# Patient Record
Sex: Male | Born: 1950
Health system: Southern US, Community
[De-identification: ages and names within clinical notes are randomized; demographics above are authoritative.]

## PROBLEM LIST (undated history)

## (undated) ENCOUNTER — Emergency Department (HOSPITAL_COMMUNITY): Disposition: A | Discharge status: Home / Self Care | Payer: Medicare Other

## (undated) DIAGNOSIS — I219 Acute myocardial infarction, unspecified: Secondary | ICD-10-CM

## (undated) DIAGNOSIS — C6290 Malignant neoplasm of unspecified testis, unspecified whether descended or undescended: Secondary | ICD-10-CM

## (undated) DIAGNOSIS — N200 Calculus of kidney: Secondary | ICD-10-CM

## (undated) DIAGNOSIS — F17201 Nicotine dependence, unspecified, in remission: Secondary | ICD-10-CM

## (undated) DIAGNOSIS — E785 Hyperlipidemia, unspecified: Secondary | ICD-10-CM

## (undated) DIAGNOSIS — I2510 Atherosclerotic heart disease of native coronary artery without angina pectoris: Secondary | ICD-10-CM

## (undated) DIAGNOSIS — J449 Chronic obstructive pulmonary disease, unspecified: Secondary | ICD-10-CM

## (undated) DIAGNOSIS — I4821 Permanent atrial fibrillation: Secondary | ICD-10-CM

## (undated) DIAGNOSIS — I714 Abdominal aortic aneurysm, without rupture, unspecified: Secondary | ICD-10-CM

## (undated) DIAGNOSIS — D649 Anemia, unspecified: Secondary | ICD-10-CM

## (undated) DIAGNOSIS — I5042 Chronic combined systolic (congestive) and diastolic (congestive) heart failure: Secondary | ICD-10-CM

## (undated) DIAGNOSIS — I739 Peripheral vascular disease, unspecified: Secondary | ICD-10-CM

## (undated) DIAGNOSIS — K8000 Calculus of gallbladder with acute cholecystitis without obstruction: Secondary | ICD-10-CM

## (undated) DIAGNOSIS — I10 Essential (primary) hypertension: Secondary | ICD-10-CM

## (undated) DIAGNOSIS — K219 Gastro-esophageal reflux disease without esophagitis: Secondary | ICD-10-CM

## (undated) DIAGNOSIS — Z7901 Long term (current) use of anticoagulants: Secondary | ICD-10-CM

## (undated) DIAGNOSIS — N184 Chronic kidney disease, stage 4 (severe): Secondary | ICD-10-CM

## (undated) DIAGNOSIS — I679 Cerebrovascular disease, unspecified: Secondary | ICD-10-CM

## (undated) HISTORY — DX: Anemia, unspecified: D64.9

## (undated) HISTORY — DX: Chronic kidney disease, stage 4 (severe): N18.4

## (undated) HISTORY — DX: Calculus of kidney: N20.0

## (undated) HISTORY — DX: Peripheral vascular disease, unspecified: I73.9

## (undated) HISTORY — DX: Hyperlipidemia, unspecified: E78.5

## (undated) HISTORY — DX: Cerebrovascular disease, unspecified: I67.9

## (undated) HISTORY — DX: Malignant neoplasm of unspecified testis, unspecified whether descended or undescended: C62.90

## (undated) HISTORY — DX: Nicotine dependence, unspecified, in remission: F17.201

## (undated) HISTORY — DX: Atherosclerotic heart disease of native coronary artery without angina pectoris: I25.10

## (undated) HISTORY — DX: Essential (primary) hypertension: I10

## (undated) HISTORY — DX: Long term (current) use of anticoagulants: Z79.01

## (undated) HISTORY — DX: Calculus of gallbladder with acute cholecystitis without obstruction: K80.00

---

## 1988-05-03 DIAGNOSIS — C6290 Malignant neoplasm of unspecified testis, unspecified whether descended or undescended: Secondary | ICD-10-CM

## 1988-05-03 HISTORY — DX: Malignant neoplasm of unspecified testis, unspecified whether descended or undescended: C62.90

## 1988-05-03 HISTORY — PX: OTHER SURGICAL HISTORY: SHX169

## 2000-05-03 DIAGNOSIS — I679 Cerebrovascular disease, unspecified: Secondary | ICD-10-CM

## 2000-05-03 HISTORY — DX: Cerebrovascular disease, unspecified: I67.9

## 2001-01-09 ENCOUNTER — Encounter: Payer: Self-pay | Admitting: Emergency Medicine

## 2001-01-09 ENCOUNTER — Emergency Department (HOSPITAL_COMMUNITY): Admission: EM | Admit: 2001-01-09 | Discharge: 2001-01-09 | Payer: Self-pay | Admitting: Emergency Medicine

## 2002-05-03 HISTORY — PX: APPENDECTOMY: SHX54

## 2002-07-13 ENCOUNTER — Encounter: Payer: Self-pay | Admitting: *Deleted

## 2002-07-14 ENCOUNTER — Encounter: Payer: Self-pay | Admitting: *Deleted

## 2002-07-14 ENCOUNTER — Inpatient Hospital Stay (HOSPITAL_COMMUNITY): Admission: EM | Admit: 2002-07-14 | Discharge: 2002-07-28 | Payer: Self-pay | Admitting: *Deleted

## 2002-07-18 ENCOUNTER — Encounter: Payer: Self-pay | Admitting: General Surgery

## 2002-07-18 ENCOUNTER — Encounter: Payer: Self-pay | Admitting: *Deleted

## 2002-07-20 ENCOUNTER — Encounter: Payer: Self-pay | Admitting: General Surgery

## 2003-05-09 ENCOUNTER — Emergency Department (HOSPITAL_COMMUNITY): Admission: EM | Admit: 2003-05-09 | Discharge: 2003-05-10 | Payer: Self-pay | Admitting: Emergency Medicine

## 2003-10-09 ENCOUNTER — Emergency Department (HOSPITAL_COMMUNITY): Admission: EM | Admit: 2003-10-09 | Discharge: 2003-10-09 | Payer: Self-pay | Admitting: Emergency Medicine

## 2003-12-24 ENCOUNTER — Emergency Department (HOSPITAL_COMMUNITY): Admission: EM | Admit: 2003-12-24 | Discharge: 2003-12-24 | Payer: Self-pay | Admitting: *Deleted

## 2005-03-02 ENCOUNTER — Ambulatory Visit: Payer: Self-pay | Admitting: Family Medicine

## 2005-12-14 ENCOUNTER — Emergency Department (HOSPITAL_COMMUNITY): Admission: EM | Admit: 2005-12-14 | Discharge: 2005-12-14 | Payer: Self-pay | Admitting: Emergency Medicine

## 2006-03-11 ENCOUNTER — Emergency Department (HOSPITAL_COMMUNITY): Admission: EM | Admit: 2006-03-11 | Discharge: 2006-03-12 | Payer: Self-pay | Admitting: Emergency Medicine

## 2006-12-16 ENCOUNTER — Inpatient Hospital Stay (HOSPITAL_COMMUNITY): Admission: EM | Admit: 2006-12-16 | Discharge: 2006-12-20 | Payer: Self-pay | Admitting: Emergency Medicine

## 2006-12-16 ENCOUNTER — Ambulatory Visit: Payer: Self-pay | Admitting: Internal Medicine

## 2006-12-27 ENCOUNTER — Ambulatory Visit: Payer: Self-pay | Admitting: Cardiovascular Disease

## 2006-12-28 ENCOUNTER — Ambulatory Visit (HOSPITAL_COMMUNITY): Admission: RE | Admit: 2006-12-28 | Discharge: 2006-12-28 | Payer: Self-pay | Admitting: Cardiovascular Disease

## 2006-12-28 ENCOUNTER — Ambulatory Visit: Payer: Self-pay | Admitting: Cardiovascular Disease

## 2007-01-11 ENCOUNTER — Ambulatory Visit: Payer: Self-pay | Admitting: Cardiology

## 2007-01-12 ENCOUNTER — Ambulatory Visit (HOSPITAL_COMMUNITY): Admission: RE | Admit: 2007-01-12 | Discharge: 2007-01-12 | Payer: Self-pay | Admitting: Cardiovascular Disease

## 2007-01-19 ENCOUNTER — Ambulatory Visit: Payer: Self-pay | Admitting: Cardiology

## 2007-02-07 ENCOUNTER — Ambulatory Visit: Payer: Self-pay | Admitting: Cardiovascular Disease

## 2007-02-24 ENCOUNTER — Ambulatory Visit: Payer: Self-pay | Admitting: Cardiovascular Disease

## 2007-04-20 ENCOUNTER — Ambulatory Visit: Payer: Self-pay | Admitting: Cardiovascular Disease

## 2007-04-21 ENCOUNTER — Ambulatory Visit: Payer: Self-pay | Admitting: Cardiology

## 2007-05-24 ENCOUNTER — Ambulatory Visit (HOSPITAL_COMMUNITY): Admission: RE | Admit: 2007-05-24 | Discharge: 2007-05-24 | Payer: Self-pay | Admitting: Cardiovascular Disease

## 2007-05-24 ENCOUNTER — Ambulatory Visit: Payer: Self-pay | Admitting: Cardiovascular Disease

## 2007-05-31 ENCOUNTER — Ambulatory Visit: Payer: Self-pay | Admitting: Vascular Surgery

## 2007-06-06 ENCOUNTER — Ambulatory Visit (HOSPITAL_COMMUNITY): Admission: RE | Admit: 2007-06-06 | Discharge: 2007-06-06 | Payer: Self-pay | Admitting: Vascular Surgery

## 2007-06-07 ENCOUNTER — Ambulatory Visit: Payer: Self-pay | Admitting: Vascular Surgery

## 2007-08-10 ENCOUNTER — Ambulatory Visit: Payer: Self-pay | Admitting: Cardiology

## 2007-12-06 ENCOUNTER — Ambulatory Visit: Payer: Self-pay | Admitting: Cardiology

## 2007-12-06 ENCOUNTER — Ambulatory Visit: Payer: Self-pay | Admitting: Internal Medicine

## 2007-12-15 ENCOUNTER — Ambulatory Visit: Payer: Self-pay | Admitting: Cardiology

## 2007-12-15 ENCOUNTER — Encounter (INDEPENDENT_AMBULATORY_CARE_PROVIDER_SITE_OTHER): Payer: Self-pay | Admitting: *Deleted

## 2007-12-15 LAB — CONVERTED CEMR LAB
BUN: 12 mg/dL
CO2: 28 meq/L
Calcium: 9.6 mg/dL
Chloride: 101 meq/L
Creatinine, Ser: 1.06 mg/dL
Glucose, Bld: 117 mg/dL
HCT: 47.9 %
Hemoglobin: 16.3 g/dL
MCV: 88.5 fL
Platelets: 166 10*3/uL
Potassium: 4.2 meq/L
Sodium: 137 meq/L
WBC: 5.8 10*3/uL

## 2007-12-19 ENCOUNTER — Ambulatory Visit (HOSPITAL_COMMUNITY): Admission: RE | Admit: 2007-12-19 | Discharge: 2007-12-19 | Payer: Self-pay | Admitting: Cardiology

## 2007-12-21 ENCOUNTER — Ambulatory Visit: Payer: Self-pay | Admitting: Cardiology

## 2007-12-21 ENCOUNTER — Encounter: Payer: Self-pay | Admitting: Cardiology

## 2007-12-21 ENCOUNTER — Ambulatory Visit (HOSPITAL_COMMUNITY): Admission: RE | Admit: 2007-12-21 | Discharge: 2007-12-21 | Payer: Self-pay | Admitting: Cardiology

## 2007-12-22 ENCOUNTER — Ambulatory Visit: Payer: Self-pay | Admitting: Cardiology

## 2007-12-28 ENCOUNTER — Ambulatory Visit: Payer: Self-pay | Admitting: Vascular Surgery

## 2008-01-09 ENCOUNTER — Ambulatory Visit: Payer: Self-pay | Admitting: Cardiology

## 2008-04-09 ENCOUNTER — Ambulatory Visit: Payer: Self-pay | Admitting: Cardiology

## 2008-08-08 ENCOUNTER — Ambulatory Visit: Payer: Self-pay | Admitting: Cardiology

## 2008-10-22 DIAGNOSIS — C6290 Malignant neoplasm of unspecified testis, unspecified whether descended or undescended: Secondary | ICD-10-CM

## 2008-10-22 DIAGNOSIS — N200 Calculus of kidney: Secondary | ICD-10-CM | POA: Insufficient documentation

## 2008-10-22 DIAGNOSIS — I10 Essential (primary) hypertension: Secondary | ICD-10-CM | POA: Insufficient documentation

## 2008-10-22 DIAGNOSIS — Z8547 Personal history of malignant neoplasm of testis: Secondary | ICD-10-CM | POA: Insufficient documentation

## 2008-10-23 ENCOUNTER — Encounter (INDEPENDENT_AMBULATORY_CARE_PROVIDER_SITE_OTHER): Payer: Self-pay

## 2008-10-24 ENCOUNTER — Encounter: Payer: Self-pay | Admitting: Cardiology

## 2008-10-24 ENCOUNTER — Ambulatory Visit: Payer: Self-pay | Admitting: Cardiology

## 2008-10-24 DIAGNOSIS — E785 Hyperlipidemia, unspecified: Secondary | ICD-10-CM | POA: Insufficient documentation

## 2008-10-31 ENCOUNTER — Ambulatory Visit: Payer: Self-pay | Admitting: Cardiology

## 2008-11-07 ENCOUNTER — Ambulatory Visit: Payer: Self-pay | Admitting: Cardiology

## 2008-11-18 ENCOUNTER — Ambulatory Visit: Payer: Self-pay | Admitting: Cardiology

## 2008-11-19 ENCOUNTER — Encounter: Payer: Self-pay | Admitting: Cardiology

## 2008-12-05 ENCOUNTER — Ambulatory Visit: Payer: Self-pay | Admitting: Cardiology

## 2008-12-16 ENCOUNTER — Encounter: Payer: Self-pay | Admitting: *Deleted

## 2008-12-19 ENCOUNTER — Ambulatory Visit: Payer: Self-pay | Admitting: Cardiology

## 2008-12-19 ENCOUNTER — Encounter: Payer: Self-pay | Admitting: Cardiology

## 2008-12-19 LAB — CONVERTED CEMR LAB: POC INR: 2

## 2008-12-23 ENCOUNTER — Encounter (INDEPENDENT_AMBULATORY_CARE_PROVIDER_SITE_OTHER): Payer: Self-pay | Admitting: *Deleted

## 2008-12-23 LAB — CONVERTED CEMR LAB
OCCULT 1: NEGATIVE
OCCULT 2: NEGATIVE
OCCULT 3: NEGATIVE

## 2008-12-25 ENCOUNTER — Ambulatory Visit: Payer: Self-pay | Admitting: Cardiovascular Disease

## 2008-12-25 ENCOUNTER — Encounter: Payer: Self-pay | Admitting: Physician Assistant

## 2008-12-25 ENCOUNTER — Ambulatory Visit: Payer: Self-pay | Admitting: Cardiology

## 2009-01-08 ENCOUNTER — Encounter: Payer: Self-pay | Admitting: Cardiology

## 2009-01-09 ENCOUNTER — Ambulatory Visit: Payer: Self-pay

## 2009-01-09 LAB — CONVERTED CEMR LAB: POC INR: 2.4

## 2009-02-03 ENCOUNTER — Ambulatory Visit: Payer: Self-pay | Admitting: Cardiology

## 2009-02-03 LAB — CONVERTED CEMR LAB: POC INR: 2

## 2009-03-03 ENCOUNTER — Telehealth (INDEPENDENT_AMBULATORY_CARE_PROVIDER_SITE_OTHER): Payer: Self-pay | Admitting: *Deleted

## 2009-03-03 ENCOUNTER — Ambulatory Visit: Payer: Self-pay | Admitting: Cardiology

## 2009-03-03 LAB — CONVERTED CEMR LAB: POC INR: 2.2

## 2009-04-03 ENCOUNTER — Ambulatory Visit: Payer: Self-pay | Admitting: Cardiology

## 2009-04-03 LAB — CONVERTED CEMR LAB: POC INR: 3.1

## 2009-04-04 ENCOUNTER — Encounter (INDEPENDENT_AMBULATORY_CARE_PROVIDER_SITE_OTHER): Payer: Self-pay | Admitting: *Deleted

## 2009-04-16 ENCOUNTER — Ambulatory Visit: Payer: Self-pay | Admitting: Cardiology

## 2009-05-01 ENCOUNTER — Ambulatory Visit: Payer: Self-pay | Admitting: Cardiology

## 2009-05-01 LAB — CONVERTED CEMR LAB: POC INR: 1.8

## 2009-05-22 ENCOUNTER — Ambulatory Visit: Payer: Self-pay | Admitting: Cardiology

## 2009-05-22 LAB — CONVERTED CEMR LAB: POC INR: 3.5

## 2009-06-16 ENCOUNTER — Emergency Department (HOSPITAL_COMMUNITY): Admission: EM | Admit: 2009-06-16 | Discharge: 2009-06-16 | Payer: Self-pay | Admitting: Emergency Medicine

## 2009-06-16 ENCOUNTER — Ambulatory Visit: Payer: Self-pay | Admitting: Cardiology

## 2009-06-16 LAB — CONVERTED CEMR LAB: POC INR: 3.5

## 2009-07-02 ENCOUNTER — Ambulatory Visit: Payer: Self-pay | Admitting: Cardiology

## 2009-07-02 LAB — CONVERTED CEMR LAB: POC INR: 3.3

## 2009-07-28 ENCOUNTER — Ambulatory Visit: Payer: Self-pay | Admitting: Cardiology

## 2009-07-28 LAB — CONVERTED CEMR LAB: POC INR: 2

## 2009-08-14 ENCOUNTER — Ambulatory Visit: Payer: Self-pay | Admitting: Cardiology

## 2009-08-25 ENCOUNTER — Ambulatory Visit: Payer: Self-pay | Admitting: Cardiology

## 2009-08-25 LAB — CONVERTED CEMR LAB: POC INR: 1.9

## 2009-09-17 ENCOUNTER — Ambulatory Visit: Payer: Self-pay | Admitting: Cardiology

## 2009-10-08 ENCOUNTER — Ambulatory Visit: Payer: Self-pay | Admitting: Cardiology

## 2009-10-08 LAB — CONVERTED CEMR LAB: POC INR: 3.1

## 2009-11-05 ENCOUNTER — Ambulatory Visit: Payer: Self-pay | Admitting: Cardiovascular Disease

## 2009-11-05 LAB — CONVERTED CEMR LAB: POC INR: 2.4

## 2009-11-07 ENCOUNTER — Encounter (INDEPENDENT_AMBULATORY_CARE_PROVIDER_SITE_OTHER): Payer: Self-pay | Admitting: *Deleted

## 2009-11-07 ENCOUNTER — Encounter: Payer: Self-pay | Admitting: Cardiology

## 2009-11-07 ENCOUNTER — Encounter (INDEPENDENT_AMBULATORY_CARE_PROVIDER_SITE_OTHER): Payer: Self-pay

## 2009-11-10 ENCOUNTER — Encounter: Payer: Self-pay | Admitting: Cardiology

## 2009-11-10 LAB — CONVERTED CEMR LAB
ALT: 11 units/L (ref 0–53)
AST: 15 units/L (ref 0–37)
Albumin: 4.1 g/dL (ref 3.5–5.2)
Alkaline Phosphatase: 67 units/L (ref 39–117)
Bilirubin, Direct: 0.1 mg/dL (ref 0.0–0.3)
Cholesterol: 105 mg/dL (ref 0–200)
HDL: 31 mg/dL — ABNORMAL LOW (ref >39)
Indirect Bilirubin: 0.6 mg/dL (ref 0.0–0.9)
LDL Cholesterol: 29 mg/dL (ref 0–99)
Total Bilirubin: 0.7 mg/dL (ref 0.3–1.2)
Total CHOL/HDL Ratio: 3.4
Total Protein: 6.8 g/dL (ref 6.0–8.3)
Triglycerides: 224 mg/dL — ABNORMAL HIGH (ref <150)
VLDL: 45 mg/dL — ABNORMAL HIGH (ref 0–40)

## 2009-11-13 ENCOUNTER — Ambulatory Visit: Payer: Self-pay | Admitting: Cardiology

## 2009-11-13 DIAGNOSIS — I4821 Permanent atrial fibrillation: Secondary | ICD-10-CM | POA: Insufficient documentation

## 2009-11-13 DIAGNOSIS — I739 Peripheral vascular disease, unspecified: Secondary | ICD-10-CM | POA: Insufficient documentation

## 2009-11-13 DIAGNOSIS — I2510 Atherosclerotic heart disease of native coronary artery without angina pectoris: Secondary | ICD-10-CM | POA: Insufficient documentation

## 2009-11-14 ENCOUNTER — Encounter: Payer: Self-pay | Admitting: Cardiology

## 2009-11-17 ENCOUNTER — Ambulatory Visit: Payer: Self-pay | Admitting: Cardiology

## 2009-11-17 ENCOUNTER — Encounter (INDEPENDENT_AMBULATORY_CARE_PROVIDER_SITE_OTHER): Payer: Self-pay | Admitting: *Deleted

## 2009-11-17 LAB — CONVERTED CEMR LAB
OCCULT 1: NEGATIVE
OCCULT 2: NEGATIVE
OCCULT 3: NEGATIVE

## 2009-11-26 ENCOUNTER — Encounter (INDEPENDENT_AMBULATORY_CARE_PROVIDER_SITE_OTHER): Payer: Self-pay | Admitting: *Deleted

## 2009-12-01 ENCOUNTER — Encounter (INDEPENDENT_AMBULATORY_CARE_PROVIDER_SITE_OTHER): Payer: Self-pay

## 2009-12-01 ENCOUNTER — Encounter (INDEPENDENT_AMBULATORY_CARE_PROVIDER_SITE_OTHER): Payer: Self-pay | Admitting: *Deleted

## 2009-12-04 ENCOUNTER — Ambulatory Visit: Payer: Self-pay | Admitting: Cardiology

## 2009-12-04 LAB — CONVERTED CEMR LAB: POC INR: 2

## 2010-01-01 ENCOUNTER — Ambulatory Visit: Payer: Self-pay | Admitting: Cardiology

## 2010-01-01 LAB — CONVERTED CEMR LAB: POC INR: 2.9

## 2010-01-02 ENCOUNTER — Ambulatory Visit: Payer: Self-pay | Admitting: Cardiology

## 2010-01-29 ENCOUNTER — Encounter (INDEPENDENT_AMBULATORY_CARE_PROVIDER_SITE_OTHER): Payer: Self-pay | Admitting: *Deleted

## 2010-02-02 ENCOUNTER — Ambulatory Visit: Payer: Self-pay | Admitting: Cardiology

## 2010-02-02 ENCOUNTER — Telehealth (INDEPENDENT_AMBULATORY_CARE_PROVIDER_SITE_OTHER): Payer: Self-pay

## 2010-02-02 LAB — CONVERTED CEMR LAB: POC INR: 2.8

## 2010-03-02 ENCOUNTER — Ambulatory Visit: Payer: Self-pay | Admitting: Cardiology

## 2010-03-02 LAB — CONVERTED CEMR LAB: POC INR: 2.6

## 2010-03-30 ENCOUNTER — Ambulatory Visit: Payer: Self-pay | Admitting: Cardiology

## 2010-03-30 LAB — CONVERTED CEMR LAB: POC INR: 2.7

## 2010-04-07 ENCOUNTER — Ambulatory Visit: Payer: Self-pay | Admitting: Cardiology

## 2010-04-29 ENCOUNTER — Ambulatory Visit: Payer: Self-pay | Admitting: Cardiology

## 2010-05-07 LAB — CONVERTED CEMR LAB: POC INR: 2.1

## 2010-05-24 ENCOUNTER — Encounter: Payer: Self-pay | Admitting: Cardiovascular Disease

## 2010-05-24 ENCOUNTER — Encounter: Payer: Self-pay | Admitting: Vascular Surgery

## 2010-05-28 ENCOUNTER — Inpatient Hospital Stay (HOSPITAL_COMMUNITY)
Admission: AD | Admit: 2010-05-28 | Discharge: 2010-06-05 | DRG: 287 | Disposition: A | Discharge status: Home / Self Care | Payer: MEDICARE | Source: Other Acute Inpatient Hospital | Attending: Cardiology | Admitting: Cardiology

## 2010-05-28 ENCOUNTER — Ambulatory Visit
Admission: RE | Admit: 2010-05-28 | Discharge: 2010-05-28 | Payer: Self-pay | Source: Home / Self Care | Attending: Cardiology | Admitting: Cardiology

## 2010-05-28 DIAGNOSIS — I2510 Atherosclerotic heart disease of native coronary artery without angina pectoris: Principal | ICD-10-CM | POA: Diagnosis present

## 2010-05-28 DIAGNOSIS — Z7982 Long term (current) use of aspirin: Secondary | ICD-10-CM

## 2010-05-28 DIAGNOSIS — I252 Old myocardial infarction: Secondary | ICD-10-CM

## 2010-05-28 DIAGNOSIS — Z87891 Personal history of nicotine dependence: Secondary | ICD-10-CM

## 2010-05-28 DIAGNOSIS — Z9861 Coronary angioplasty status: Secondary | ICD-10-CM

## 2010-05-28 DIAGNOSIS — I4891 Unspecified atrial fibrillation: Secondary | ICD-10-CM | POA: Diagnosis present

## 2010-05-28 DIAGNOSIS — I10 Essential (primary) hypertension: Secondary | ICD-10-CM | POA: Diagnosis present

## 2010-05-28 DIAGNOSIS — Z8547 Personal history of malignant neoplasm of testis: Secondary | ICD-10-CM

## 2010-05-28 DIAGNOSIS — I2589 Other forms of chronic ischemic heart disease: Secondary | ICD-10-CM | POA: Diagnosis present

## 2010-05-28 DIAGNOSIS — I200 Unstable angina: Secondary | ICD-10-CM | POA: Diagnosis present

## 2010-05-28 DIAGNOSIS — Z7901 Long term (current) use of anticoagulants: Secondary | ICD-10-CM

## 2010-05-28 DIAGNOSIS — E875 Hyperkalemia: Secondary | ICD-10-CM | POA: Diagnosis not present

## 2010-05-28 LAB — CARDIAC PANEL(CRET KIN+CKTOT+MB+TROPI)
CK, MB: 1.1 ng/mL (ref 0.3–4.0)
Total CK: 37 U/L (ref 7–232)

## 2010-05-28 LAB — MRSA PCR SCREENING

## 2010-05-28 LAB — CBC
HCT: 44.8 % (ref 39.0–52.0)
Hemoglobin: 15.4 g/dL (ref 13.0–17.0)
MCH: 29.8 pg (ref 26.0–34.0)
MCHC: 34.4 g/dL (ref 30.0–36.0)
MCV: 86.8 fL (ref 78.0–100.0)
Platelets: 172 10*3/uL (ref 150–400)
RBC: 5.16 MIL/uL (ref 4.22–5.81)
RDW: 13.5 % (ref 11.5–15.5)
WBC: 7.5 10*3/uL (ref 4.0–10.5)

## 2010-05-28 LAB — DIFFERENTIAL
Basophils Absolute: 0 10*3/uL (ref 0.0–0.1)
Basophils Relative: 0 % (ref 0–1)
Eosinophils Absolute: 0.1 10*3/uL (ref 0.0–0.7)
Eosinophils Relative: 1 % (ref 0–5)
Lymphocytes Relative: 39 % (ref 12–46)
Lymphs Abs: 2.9 10*3/uL (ref 0.7–4.0)
Monocytes Absolute: 0.8 10*3/uL (ref 0.1–1.0)
Monocytes Relative: 10 % (ref 3–12)
Neutro Abs: 3.7 10*3/uL (ref 1.7–7.7)
Neutrophils Relative %: 49 % (ref 43–77)

## 2010-05-28 LAB — PROTIME-INR
INR: 2.81 — ABNORMAL HIGH (ref 0.00–1.49)
Prothrombin Time: 29.7 seconds — ABNORMAL HIGH (ref 11.6–15.2)

## 2010-05-28 LAB — APTT

## 2010-05-29 LAB — CARDIAC PANEL(CRET KIN+CKTOT+MB+TROPI)
CK, MB: 1.1 ng/mL (ref 0.3–4.0)
CK, MB: 1.2 ng/mL (ref 0.3–4.0)
Total CK: 37 U/L (ref 7–232)
Total CK: 40 U/L (ref 7–232)

## 2010-05-29 LAB — CBC
HCT: 44.7 % (ref 39.0–52.0)
Hemoglobin: 15.2 g/dL (ref 13.0–17.0)
MCH: 29.7 pg (ref 26.0–34.0)
MCHC: 34 g/dL (ref 30.0–36.0)
MCV: 87.3 fL (ref 78.0–100.0)
Platelets: 165 10*3/uL (ref 150–400)
RBC: 5.12 MIL/uL (ref 4.22–5.81)
RDW: 13.6 % (ref 11.5–15.5)
WBC: 7.6 10*3/uL (ref 4.0–10.5)

## 2010-05-29 LAB — BASIC METABOLIC PANEL
BUN: 17 mg/dL (ref 6–23)
CO2: 28 mEq/L (ref 19–32)
Calcium: 9.1 mg/dL (ref 8.4–10.5)
Chloride: 103 mEq/L (ref 96–112)
Creatinine, Ser: 1.28 mg/dL (ref 0.4–1.5)
GFR calc non Af Amer: 58 mL/min — ABNORMAL LOW (ref >60)
Glucose, Bld: 105 mg/dL — ABNORMAL HIGH (ref 70–99)
Potassium: 3.8 mEq/L (ref 3.5–5.1)
Sodium: 137 mEq/L (ref 135–145)

## 2010-05-29 LAB — LIPID PANEL
HDL: 31 mg/dL — ABNORMAL LOW (ref >39)
Total CHOL/HDL Ratio: 3.4 RATIO
Triglycerides: 301 mg/dL — ABNORMAL HIGH (ref <150)
VLDL: 60 mg/dL — ABNORMAL HIGH (ref 0–40)

## 2010-05-29 LAB — PROTIME-INR
INR: 2.59 — ABNORMAL HIGH (ref 0.00–1.49)
Prothrombin Time: 27.9 seconds — ABNORMAL HIGH (ref 11.6–15.2)

## 2010-05-30 LAB — BASIC METABOLIC PANEL
BUN: 15 mg/dL (ref 6–23)
CO2: 27 mEq/L (ref 19–32)
Calcium: 9.2 mg/dL (ref 8.4–10.5)
Chloride: 103 mEq/L (ref 96–112)
Creatinine, Ser: 1.2 mg/dL (ref 0.4–1.5)
GFR calc non Af Amer: >60 mL/min (ref >60)
Glucose, Bld: 93 mg/dL (ref 70–99)
Potassium: 4.4 mEq/L (ref 3.5–5.1)
Sodium: 139 mEq/L (ref 135–145)

## 2010-05-30 LAB — CBC
HCT: 46.1 % (ref 39.0–52.0)
Hemoglobin: 15.6 g/dL (ref 13.0–17.0)
MCH: 29.5 pg (ref 26.0–34.0)
MCHC: 33.8 g/dL (ref 30.0–36.0)
MCV: 87.1 fL (ref 78.0–100.0)
Platelets: 162 10*3/uL (ref 150–400)
RBC: 5.29 MIL/uL (ref 4.22–5.81)
RDW: 13.4 % (ref 11.5–15.5)
WBC: 8.1 10*3/uL (ref 4.0–10.5)

## 2010-05-30 LAB — HEPARIN LEVEL (UNFRACTIONATED): Heparin Unfractionated: 0.11 IU/mL — ABNORMAL LOW (ref 0.30–0.70)

## 2010-05-30 LAB — PROTIME-INR
INR: 1.69 — ABNORMAL HIGH (ref 0.00–1.49)
Prothrombin Time: 20.1 seconds — ABNORMAL HIGH (ref 11.6–15.2)

## 2010-05-31 LAB — PROTIME-INR
INR: 1.22 (ref 0.00–1.49)
Prothrombin Time: 15.6 seconds — ABNORMAL HIGH (ref 11.6–15.2)

## 2010-05-31 LAB — CBC
HCT: 44.2 % (ref 39.0–52.0)
HCT: 46.4 % (ref 39.0–52.0)
Hemoglobin: 15.6 g/dL (ref 13.0–17.0)
Hemoglobin: 16.1 g/dL (ref 13.0–17.0)
MCH: 30.6 pg (ref 26.0–34.0)
MCH: 30.3 pg (ref 26.0–34.0)
MCHC: 35.3 g/dL (ref 30.0–36.0)
MCHC: 34.7 g/dL (ref 30.0–36.0)
MCV: 86.8 fL (ref 78.0–100.0)
MCV: 87.2 fL (ref 78.0–100.0)
Platelets: 170 10*3/uL (ref 150–400)
Platelets: 150 10*3/uL (ref 150–400)
RBC: 5.09 MIL/uL (ref 4.22–5.81)
RBC: 5.32 MIL/uL (ref 4.22–5.81)
RDW: 13.6 % (ref 11.5–15.5)
RDW: 13.5 % (ref 11.5–15.5)
WBC: 8.7 10*3/uL (ref 4.0–10.5)
WBC: 6.6 10*3/uL (ref 4.0–10.5)

## 2010-05-31 LAB — HEPARIN LEVEL (UNFRACTIONATED)
Heparin Unfractionated: <0.1 IU/mL — ABNORMAL LOW (ref 0.30–0.70)
Heparin Unfractionated: 0.46 IU/mL (ref 0.30–0.70)

## 2010-06-01 ENCOUNTER — Encounter: Payer: Self-pay | Admitting: Adult Health

## 2010-06-01 LAB — CBC
HCT: 46.4 % (ref 39.0–52.0)
Hemoglobin: 16 g/dL (ref 13.0–17.0)
MCH: 30.1 pg (ref 26.0–34.0)
MCHC: 34.5 g/dL (ref 30.0–36.0)
MCV: 87.2 fL (ref 78.0–100.0)
Platelets: 154 10*3/uL (ref 150–400)
RBC: 5.32 MIL/uL (ref 4.22–5.81)
RDW: 13.5 % (ref 11.5–15.5)
WBC: 6.7 10*3/uL (ref 4.0–10.5)

## 2010-06-01 LAB — BASIC METABOLIC PANEL
BUN: 16 mg/dL (ref 6–23)
CO2: 27 mEq/L (ref 19–32)
Calcium: 9.4 mg/dL (ref 8.4–10.5)
Chloride: 102 mEq/L (ref 96–112)
Creatinine, Ser: 1.16 mg/dL (ref 0.4–1.5)
GFR calc Af Amer: >60 mL/min (ref >60)
GFR calc non Af Amer: >60 mL/min (ref >60)
Glucose, Bld: 106 mg/dL — ABNORMAL HIGH (ref 70–99)
Potassium: 4.6 mEq/L (ref 3.5–5.1)
Sodium: 137 mEq/L (ref 135–145)

## 2010-06-01 LAB — PROTIME-INR
INR: 1.08 (ref 0.00–1.49)
Prothrombin Time: 14.2 seconds (ref 11.6–15.2)

## 2010-06-01 LAB — HEPARIN LEVEL (UNFRACTIONATED): Heparin Unfractionated: 0.49 IU/mL (ref 0.30–0.70)

## 2010-06-02 LAB — CBC
HCT: 45.5 % (ref 39.0–52.0)
Hemoglobin: 15.5 g/dL (ref 13.0–17.0)
MCH: 29.6 pg (ref 26.0–34.0)
MCHC: 34.1 g/dL (ref 30.0–36.0)
MCV: 86.8 fL (ref 78.0–100.0)
Platelets: 154 10*3/uL (ref 150–400)
RBC: 5.24 MIL/uL (ref 4.22–5.81)
RDW: 13.5 % (ref 11.5–15.5)
WBC: 6.6 10*3/uL (ref 4.0–10.5)

## 2010-06-02 LAB — PROTIME-INR
INR: 1.01 (ref 0.00–1.49)
Prothrombin Time: 13.5 seconds (ref 11.6–15.2)

## 2010-06-02 LAB — HEPARIN LEVEL (UNFRACTIONATED)
Heparin Unfractionated: <0.1 IU/mL — ABNORMAL LOW (ref 0.30–0.70)
Heparin Unfractionated: <0.1 IU/mL — ABNORMAL LOW (ref 0.30–0.70)
Heparin Unfractionated: 0.35 IU/mL (ref 0.30–0.70)

## 2010-06-02 NOTE — Assessment & Plan Note (Signed)
Summary: 1 YR F/U PER CHECKOUT ON 10/24/08/TG      Allergies Added: NKDA  Visit Type:  Follow-up Referring Provider:  . Primary Provider:  Dr. Edrick Oh   History of Present Illness: [name] returns to the office for continued assessment and treatment of coronary artery disease, atrial fibrillation and cardiovascular risk factors.  Since his last visit, he has done generally well.  He was seen in the emergency department on one occasion when he developed hematemesis after transient esophageal obstruction with a piece of food.  Apparently, there was no significant blood loss prompting discharge directly from the Emergency Department.  Otherwise he has done well from a cardiovascular standpoint.  He remains active without chest discomfort or exertional dyspnea.  He has no orthopnea, PND, peripheral edema, lightheadedness nor syncope.  In recent days, he has experienced rhinorrhea that he attributes to an allergy.  He's had no fever or rigors.  He has had a nonproductive and fairly mild cough.  EKG  Procedure date:  11/13/2009  Findings:      Rhythm Strip  Atrial fibrillation with a controlled ventricular response Heart rate of 75 bpm   Current Medications (verified): 1)  Metoprolol Tartrate 25 Mg Tabs (Metoprolol Tartrate) .[name].. 1 Tab Two Times A Day 2)  Nitrostat 0.4 Mg Subl (Nitroglycerin) .... As Needed 3)  Daily Multi  Tabs (Multiple Vitamins-Minerals) .[name].. 1 Once Daily 4)  Lisinopril 20 Mg Tabs (Lisinopril) .... Take Two  Tablets  By Mouth Daily 5)  Research Study Drug (Improve It) 6)  Warfarin Sodium 5 Mg Tabs (Warfarin Sodium) .[name].. 1 Tab Once Daily  Allergies (verified): No Known Drug Allergies  Past History:  PMH, FH, and Social History reviewed and updated.  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 aorta NEPHROLITHIASIS (ICD-592.0)  Social History: Disabled  Married  Tobacco Use -20 pack years discontinued in 2009 Alcohol Use - no Regular Exercise - no Drug Use - no pt has 2 brothers 1 is deceased due to complications of asthma  Review of Systems       See history of present illness.  Vital Signs:  Patient profile:   60 year old male Height:      72 inches Weight:      198 pounds BMI:     26.95 Pulse rate:   75 / minute BP sitting:   147 / 81  (right arm)  Vitals Entered By: Doretha Sou, CNA (November 13, 2009 1:11 PM)  Physical Exam  General:  Modestly overweight; well developed; no acute distress:   Neck-No JVD; no carotid bruits: Lungs-No tachypnea, no rales; inspiratory and expiratory rhonchi; moderate expiratory wheezes with prolonged expiratory phase Cardiovascular-irregular rhythm; normal PMI; normal S1 and S2: Abdomen-BS normal; soft and non-tender without masses or organomegaly:  Musculoskeletal-No deformities, no cyanosis or clubbing: Neurologic-Normal cranial nerves; symmetric strength and tone:  Skin-Warm, no significant lesions: Extremities-Nl distal pulses; no edema:     Impression & Recommendations:  Problem # 1:  ATHEROSCLEROTIC CARDIOVASCULAR DISEASE (ICD-429.2) Patient has had no  recurrent symptoms since PCI in 2008; optimal management of cardiovascular risk factors will be continued.   He requires ongoing treatment with Coumadin.  Aspirin adds uncertain benefit but definite risk and will be discontinued.  Problem # 2:  ATRIAL FIBRILLATION (ICD-427.31) Patient is asymptomatic, and heart rate is well controlled.  Problem # 3:   ANTICOAGULATION (ICD-V58.61) CBC and stool for Hemoccult testing will be checked.  Problem # 4:  HYPERTENSION (ICD-401.9) Blood pressure is slightly elevated at this visit, but has been well-controlled in the past.  Patient will collect additional blood pressure determinations for our review.  Problem # 5:  HYPERLIPIDEMIA ([id number]) Recent lipid profile was good with total cholesterol 105, triglycerides of 224, HDL of 31 and LDL of 29.  Problem # 6:  Bronchospasm Recent history is consistent with a upper respiratory infection.  Patient is essentially asymptomatic except for rhinitis.  Findings on exam are fairly impressive, but Mr. Jardine denies dyspnea or other significant pulmonary symptoms.  He will call his primary care physician for any increase in symptoms.  Other Orders: T-Comprehensive Metabolic Panel ([id number]) T-CBC w/Diff [phone])  Patient Instructions: 1)  Your physician recommends that you schedule a follow-up appointment in: 1 year 2)  Your physician recommends that you return for lab work in: today 3)  Your physician has recommended you make the following change in your medication:  stop asprin 4)  Your physician has asked that you test your stool for blood. It is necessary to test 3 different stool specimens for accuracy. You will be given 3 hemoccult cards for specimen collection. For each stool specimen, place a small portion of stool sample (from 2 different areas of the stool) into the 2 squares on the card. Close card. Repeat with 2 more stool specimens. Bring the cards back to the office for testing.  Prevention & Chronic Care Immunizations   Influenza vaccine: Not documented    Tetanus booster: Not documented    Pneumococcal vaccine: Not documented  Colorectal Screening   Hemoccult: Not documented    Colonoscopy: Not documented  Other Screening   PSA: Not documented   Smoking status: quit  (10/22/2008)    Screening comments: 2 packs daily when  he quit smoking  Lipids   Total Cholesterol: 105  (11/10/2009)   LDL: 29  (11/10/2009)   LDL Direct: Not documented   HDL: 31  (11/10/2009)   Triglycerides: 224  (11/10/2009)    SGOT (AST): 15  (11/10/2009)   SGPT (ALT): 11  (11/10/2009) CMP ordered    Alkaline phosphatase: 67  (11/10/2009)   Total bilirubin: 0.7  (11/10/2009)  Hypertension   Last Blood Pressure: 147 / 81  (11/13/2009)   Serum creatinine: Not documented   Serum potassium Not documented CMP ordered   Self-Management Support :    Hypertension self-management support: Not documented    Lipid self-management support: Not documented

## 2010-06-02 NOTE — Medication Information (Signed)
Summary: ccr-lr  Anticoagulant Therapy  Managed by: Edrick Oh, RN PCP: Dr. Deno Lunger MD: Domenic Polite MD, Mikeal Hawthorne Indication 1: Atrial Fibrillation (ICD-427.31) Lab Used: [facility]  Site: [place] INR POC 2.6  Dietary changes: no    Health status changes: no    Bleeding/hemorrhagic complications: no    Recent/future hospitalizations: no    Any changes in medication regimen? no    Recent/future dental: no  Any missed doses?: no       Is patient compliant with meds? yes       Allergies: No Known Drug Allergies  Anticoagulation Management History:      The patient is taking warfarin and comes in today for a routine follow up visit.  Negative risk factors for bleeding include an age less than 23 years old.  The bleeding index is 'low risk'.  Positive CHADS2 values include History of HTN.  Negative CHADS2 values include Age > 22 years old.  The start date was 10/28/2008.  Anticoagulation responsible provider: Domenic Polite MD, Mikeal Hawthorne.  INR POC: 2.6.  Cuvette Lot#: [id number].  Exp: 10/11.    Anticoagulation Management Assessment/Plan:      The patient's current anticoagulation dose is Warfarin sodium 5 mg tabs: 1 tab once daily.  The target INR is 2 - 3.  The next INR is due 03/30/2010.  Anticoagulation instructions were given to patient.  Results were reviewed/authorized by Edrick Oh, RN.  He was notified by Edrick Oh RN.         Prior Anticoagulation Instructions: INR 2.8 Continue coumadin 5mg  once daily except 7.5mg  on Mondays, Wednesdays and Fridays  Current Anticoagulation Instructions: INR 2.6 Continue coumadin 5mg  once daily except 7.5mg  on Mondays, Wednesdays and Fridays

## 2010-06-02 NOTE — Medication Information (Signed)
Summary: ccr-lr  Anticoagulant Therapy  Managed by: Edrick Oh, RN Supervising MD: Domenic Polite MD, Mikeal Hawthorne Indication 1: Atrial Fibrillation (ICD-427.31) Lab Used: [facility] [place] Site: [place] INR POC 1.9  Dietary changes: no    Health status changes: no    Bleeding/hemorrhagic complications: no    Recent/future hospitalizations: no    Any changes in medication regimen? no    Recent/future dental: no  Any missed doses?: no       Is patient compliant with meds? yes       Allergies: No Known Drug Allergies  Anticoagulation Management History:      The patient is taking warfarin and comes in today for a routine follow up visit.  Negative risk factors for bleeding include an age less than 3 years old.  The bleeding index is 'low risk'.  Positive CHADS2 values include History of HTN.  Negative CHADS2 values include Age > 52 years old.  The start date was 10/28/2008.  Anticoagulation responsible provider: Domenic Polite MD, Mikeal Hawthorne.  INR POC: 1.9.  Cuvette Lot#: [id number].  Exp: 10/11.    Anticoagulation Management Assessment/Plan:      The patient's current anticoagulation dose is Warfarin sodium 5 mg tabs: 1 tab once daily.  The target INR is 2 - 3.  The next INR is due 09/17/2009.  Anticoagulation instructions were given to patient.  Results were reviewed/authorized by Edrick Oh, RN.  He was notified by Edrick Oh RN.         Prior Anticoagulation Instructions: INR 2.0 Imcrease coumadin to 5mg  once daily except 7.5mg  on Mondays  Current Anticoagulation Instructions: INR 1.9 Increase coumadin to 5mg  once daily except 7.5mg  on Mondays and Fridays

## 2010-06-02 NOTE — Medication Information (Signed)
Summary: ccr-lr  Anticoagulant Therapy  Managed by: Edrick Oh, RN Supervising MD: Domenic Polite MD, Mikeal Hawthorne Indication 1: Atrial Fibrillation (ICD-427.31) Lab Used: [facility] [place] Site: [place]  Dietary changes: no    Health status changes: no    Bleeding/hemorrhagic complications: no    Recent/future hospitalizations: no    Any changes in medication regimen? no    Recent/future dental: no  Any missed doses?: yes     Details: missed 1 dose Saturday  Is patient compliant with meds? yes       Allergies: No Known Drug Allergies  Anticoagulation Management History:      The patient is taking warfarin and comes in today for a routine follow up visit.  Negative risk factors for bleeding include an age less than 3 years old.  The bleeding index is 'low risk'.  Positive CHADS2 values include History of HTN.  Negative CHADS2 values include Age > 41 years old.  The start date was 10/28/2008.  Anticoagulation responsible provider: Domenic Polite MD, Mikeal Hawthorne.  Cuvette Lot#: [id number].  Exp: 10/11.    Anticoagulation Management Assessment/Plan:      The patient's current anticoagulation dose is Warfarin sodium 5 mg tabs: 1 tab once daily.  The target INR is 2 - 3.  The next INR is due 10/08/2009.  Anticoagulation instructions were given to patient.  Results were reviewed/authorized by Edrick Oh, RN.  He was notified by Edrick Oh RN.         Prior Anticoagulation Instructions: INR 1.9 Increase coumadin to 5mg  once daily except 7.5mg  on Mondays and Fridays  Current Anticoagulation Instructions: INR 1.8 Increase coumadin to 5mg  once daily except 7.5mg  on Mondays, Wednesdays and Fridays

## 2010-06-02 NOTE — Medication Information (Signed)
Summary: ccr-lr  Anticoagulant Therapy  Managed by: Edrick Oh, RN Supervising MD: Lattie Haw MD, Herbie Baltimore Indication 1: Atrial Fibrillation (ICD-427.31) Lab Used: [facility] [place] Site: [place] INR POC 2.2  Dietary changes: no    Health status changes: no    Bleeding/hemorrhagic complications: no    Recent/future hospitalizations: no    Any changes in medication regimen? no    Recent/future dental: no  Any missed doses?: yes     Details: Missed 1 dose saturday  Is patient compliant with meds? yes       Allergies: No Known Drug Allergies  Anticoagulation Management History:      The patient is taking warfarin and comes in today for a routine follow up visit.  Negative risk factors for bleeding include an age less than 51 years old.  The bleeding index is 'low risk'.  Positive CHADS2 values include History of HTN.  Negative CHADS2 values include Age > 74 years old.  The start date was 10/28/2008.  Anticoagulation responsible provider: Lattie Haw MD, Herbie Baltimore.  INR POC: 2.2.  Cuvette Lot#: [id number].  Exp: 10/11.    Anticoagulation Management Assessment/Plan:      The patient's current anticoagulation dose is Warfarin sodium 5 mg tabs: 1 tab once daily.  The target INR is 2 - 3.  The next INR is due 04/03/2009.  Anticoagulation instructions were given to patient.  Results were reviewed/authorized by Edrick Oh, RN.  He was notified by Edrick Oh RN.         Prior Anticoagulation Instructions: INR 2.0 today Take coumadin 10mg  today then resume 5mg  once daily except 7.5mg  M,W,F  Current Anticoagulation Instructions: INR 2.2 today Continue coumadin 5mg  once daily except 7.5mg  on Mondays, Wednesdays and Fridays

## 2010-06-02 NOTE — Medication Information (Signed)
Summary: ccr-lr  Anticoagulant Therapy  Managed by: Edrick Oh, RN PCP: Dr. Deno Lunger MD: Domenic Polite MD, Mikeal Hawthorne Indication 1: Atrial Fibrillation (ICD-427.31) Lab Used: [facility] [place] Site: [place] INR POC 2.7  Dietary changes: no    Health status changes: no    Bleeding/hemorrhagic complications: no    Recent/future hospitalizations: no    Any changes in medication regimen? no    Recent/future dental: no  Any missed doses?: no       Is patient compliant with meds? yes       Allergies: No Known Drug Allergies  Anticoagulation Management History:      The patient is taking warfarin and comes in today for a routine follow up visit.  Negative risk factors for bleeding include an age less than 55 years old.  The bleeding index is 'low risk'.  Positive CHADS2 values include History of HTN.  Negative CHADS2 values include Age > 44 years old.  The start date was 10/28/2008.  Anticoagulation responsible provider: Domenic Polite MD, Mikeal Hawthorne.  INR POC: 2.7.  Cuvette Lot#: [id number].  Exp: 10/11.    Anticoagulation Management Assessment/Plan:      The patient's current anticoagulation dose is Warfarin sodium 5 mg tabs: 1 tab once daily.  The target INR is 2 - 3.  The next INR is due 04/29/2010.  Anticoagulation instructions were given to patient.  Results were reviewed/authorized by Edrick Oh, RN.  He was notified by Edrick Oh RN.         Prior Anticoagulation Instructions: INR 2.6 Continue coumadin 5mg  once daily except 7.5mg  on Mondays, Wednesdays and Fridays  Current Anticoagulation Instructions: INR 2.7 Continue coumadin 5mg  once daily except 7.5mg  on Mondays. Wednesdays and Fridays

## 2010-06-02 NOTE — Medication Information (Signed)
Summary: ccr-lr  Anticoagulant Therapy  Managed by: Edrick Oh, RN Supervising MD: Domenic Polite MD, Mikeal Hawthorne Indication 1: Atrial Fibrillation (ICD-427.31) Lab Used: [facility] [place] Site: [place] INR POC 3.1  Dietary changes: no    Health status changes: no    Bleeding/hemorrhagic complications: no    Recent/future hospitalizations: no    Any changes in medication regimen? no    Recent/future dental: no  Any missed doses?: no       Is patient compliant with meds? yes       Allergies: No Known Drug Allergies  Anticoagulation Management History:      The patient is taking warfarin and comes in today for a routine follow up visit.  Negative risk factors for bleeding include an age less than 58 years old.  The bleeding index is 'low risk'.  Positive CHADS2 values include History of HTN.  Negative CHADS2 values include Age > 58 years old.  The start date was 10/28/2008.  Anticoagulation responsible provider: Domenic Polite MD, Mikeal Hawthorne.  INR POC: 3.1.  Cuvette Lot#: [id number].  Exp: 10/11.    Anticoagulation Management Assessment/Plan:      The patient's current anticoagulation dose is Warfarin sodium 5 mg tabs: 1 tab once daily.  The target INR is 2 - 3.  The next INR is due 11/05/2009.  Anticoagulation instructions were given to patient.  Results were reviewed/authorized by Edrick Oh, RN.  He was notified by Edrick Oh RN.         Prior Anticoagulation Instructions: INR 1.8 Increase coumadin to 5mg  once daily except 7.5mg  on Mondays, Wednesdays and Fridays  Current Anticoagulation Instructions: INR 3.1 Take 1 tablet tonight then resume 5mg  once daily except 7.5mg  on Mondays, Wednesdays and Fridays

## 2010-06-02 NOTE — Medication Information (Signed)
Summary: ccr-lr  Anticoagulant Therapy  Managed by: Edrick Oh, RN PCP: Dr. Deno Lunger MD: Lattie Haw MD, Herbie Baltimore Indication 1: Atrial Fibrillation (ICD-427.31) Lab Used: [facility] [place] Site: [place] INR POC 2.9  Dietary changes: no    Health status changes: no    Bleeding/hemorrhagic complications: no    Recent/future hospitalizations: no    Any changes in medication regimen? no    Recent/future dental: no  Any missed doses?: no       Is patient compliant with meds? yes       Allergies: No Known Drug Allergies  Anticoagulation Management History:      The patient is taking warfarin and comes in today for a routine follow up visit.  Negative risk factors for bleeding include an age less than 76 years old.  The bleeding index is 'low risk'.  Positive CHADS2 values include History of HTN.  Negative CHADS2 values include Age > 60 years old.  The start date was 10/28/2008.  Anticoagulation responsible Alylah Blakney: Lattie Haw MD, Herbie Baltimore.  INR POC: 2.9.  Cuvette Lot#: [id number].  Exp: 10/11.    Anticoagulation Management Assessment/Plan:      The patient's current anticoagulation dose is Warfarin sodium 5 mg tabs: 1 tab once daily.  The target INR is 2 - 3.  The next INR is due 01/29/2010.  Anticoagulation instructions were given to patient.  Results were reviewed/authorized by Edrick Oh, RN.  He was notified by Edrick Oh RN.         Prior Anticoagulation Instructions: INR 2.0 Take coumadin 1 1/2 tablets tonight then resume 1 tablet once daily except 1 1/2 tablets on Mondays, Wednesdays and Fridays  Current Anticoagulation Instructions: INR 2.9 Continue coumadin 5mg  once daily except 7.5mg  on Mondays, Wednesdays and Fridays

## 2010-06-02 NOTE — Medication Information (Signed)
Summary: ccr-lr  Anticoagulant Therapy  Managed by: Edrick Oh, RN Supervising MD: Lattie Haw MD, Herbie Baltimore Indication 1: Atrial Fibrillation (ICD-427.31) Lab Used: [facility] [place] Site:  INR POC 3.5  Dietary changes: no    Health status changes: no    Bleeding/hemorrhagic complications: no    Recent/future hospitalizations: no    Any changes in medication regimen? no    Recent/future dental: no  Any missed doses?: no       Is patient compliant with meds? yes       Allergies: No Known Drug Allergies  Anticoagulation Management History:      The patient is taking warfarin and comes in today for a routine follow up visit.  Negative risk factors for bleeding include an age less than 37 years old.  The bleeding index is 'low risk'.  Positive CHADS2 values include History of HTN.  Negative CHADS2 values include Age > 92 years old.  The start date was 10/28/2008.  Anticoagulation responsible provider: Lattie Haw MD, Herbie Baltimore.  INR POC: 3.5.  Cuvette Lot#: [id number].  Exp: 10/11.    Anticoagulation Management Assessment/Plan:      The patient's current anticoagulation dose is Warfarin sodium 5 mg tabs: 1 tab once daily.  The target INR is 2 - 3.  The next INR is due 06/16/2009.  Anticoagulation instructions were given to patient.  Results were reviewed/authorized by Edrick Oh, RN.  He was notified by Edrick Oh RN.         Prior Anticoagulation Instructions: INR 1.8 Take coumadin 2 tablets tonight then resume 1 tablet once daily except 1 1/2 tablets on Mondays, Wednesdays and Fridays  Current Anticoagulation Instructions: INR 3.5 Hold couamdin tonight then resume 5mg  once daily except 7.5mg  on Mondays, Wednesdays and Fridays

## 2010-06-02 NOTE — Miscellaneous (Signed)
Summary: Orders Update: Hemoccult  Clinical Lists Changes  Orders: Added new Test order of Hemoccult Cards (Take Home) (Hemoccult Cards) - Signed

## 2010-06-02 NOTE — Medication Information (Signed)
Summary: ccr-lr  Anticoagulant Therapy  Managed by: Edrick Oh, RN Supervising MD: Lattie Haw MD, Herbie Baltimore Indication 1: Atrial Fibrillation (ICD-427.31) Lab Used: [facility] [place] Site: [place] INR POC 1.8  Dietary changes: no    Health status changes: no    Bleeding/hemorrhagic complications: no    Recent/future hospitalizations: no    Any changes in medication regimen? no    Recent/future dental: no  Any missed doses?: no       Is patient compliant with meds? yes       Allergies: No Known Drug Allergies  Anticoagulation Management History:      The patient is taking warfarin and comes in today for a routine follow up visit.  Negative risk factors for bleeding include an age less than 23 years old.  The bleeding index is 'low risk'.  Positive CHADS2 values include History of HTN.  Negative CHADS2 values include Age > 74 years old.  The start date was 10/28/2008.  Anticoagulation responsible provider: Lattie Haw MD, Herbie Baltimore.  INR POC: 1.8.  Cuvette Lot#: [id number].  Exp: 10/11.    Anticoagulation Management Assessment/Plan:      The patient's current anticoagulation dose is Warfarin sodium 5 mg tabs: 1 tab once daily.  The target INR is 2 - 3.  The next INR is due 05/22/2009.  Anticoagulation instructions were given to patient.  Results were reviewed/authorized by Edrick Oh, RN.  He was notified by Edrick Oh RN.         Prior Anticoagulation Instructions: INR 3.1 Take coumadin 1/2 tablet tonight then resume 1 tablet once daily except 1 1/2 tablet on Mondays, Wednesdays and Fridays  Current Anticoagulation Instructions: INR 1.8 Take coumadin 2 tablets tonight then resume 1 tablet once daily except 1 1/2 tablets on Mondays, Wednesdays and Fridays

## 2010-06-02 NOTE — Medication Information (Signed)
Summary: ccr-lr  Anticoagulant Therapy  Managed by: Edrick Oh, RN Supervising MD: Domenic Polite MD, Mikeal Hawthorne Indication 1: Atrial Fibrillation (ICD-427.31) Lab Used: [facility] [place] Site: [place] INR POC 2.0  Dietary changes: no    Health status changes: no    Bleeding/hemorrhagic complications: no    Recent/future hospitalizations: no    Any changes in medication regimen? no    Recent/future dental: no  Any missed doses?: no       Is patient compliant with meds? yes       Allergies: No Known Drug Allergies  Anticoagulation Management History:      The patient is taking warfarin and comes in today for a routine follow up visit.  Negative risk factors for bleeding include an age less than 49 years old.  The bleeding index is 'low risk'.  Positive CHADS2 values include History of HTN.  Negative CHADS2 values include Age > 47 years old.  The start date was 10/28/2008.  Anticoagulation responsible provider: Domenic Polite MD, Mikeal Hawthorne.  INR POC: 2.0.  Cuvette Lot#: [id number].  Exp: 10/11.    Anticoagulation Management Assessment/Plan:      The patient's current anticoagulation dose is Warfarin sodium 5 mg tabs: 1 tab once daily.  The target INR is 2 - 3.  The next INR is due 03/03/2009.  Anticoagulation instructions were given to patient.  Results were reviewed/authorized by Edrick Oh, RN.  He was notified by Edrick Oh RN.         Prior Anticoagulation Instructions: INR 2.4 today continue coumadin 5mg  once daily except 7.5mg  on M,W,F  Current Anticoagulation Instructions: INR 2.0 today Take coumadin 10mg  today then resume 5mg  once daily except 7.5mg  M,W,F

## 2010-06-02 NOTE — Miscellaneous (Signed)
Summary: Primary Care MD  Clinical Lists Changes

## 2010-06-02 NOTE — Progress Notes (Signed)
Summary: rx refill  Medications Added LISINOPRIL 20 MG TABS (LISINOPRIL) Take two  tablets  by mouth daily       Phone Note Call from Patient Call back at Home Phone [phone]   Reason for Call: Refill Medication Summary of Call: per pt walk in needs lisinoprill 20mg  [facility] Initial call taken by: Nevada Crane,  March 03, 2009 11:40 AM    New/Updated Medications: LISINOPRIL 20 MG TABS (LISINOPRIL) Take two  tablets  by mouth daily Prescriptions: LISINOPRIL 20 MG TABS (LISINOPRIL) Take two  tablets  by mouth daily  #60 x 6   Entered by:   Tye Savoy RN   Authorized by:   Yehuda Savannah, MD, [facility]   Signed by:   Tye Savoy RN on 03/03/2009   Method used:   Electronically to        [facility]* (retail)       [address], [place]  [address]       Ph: [phone]       Fax: [phone]   RxID:   [id number]

## 2010-06-02 NOTE — Medication Information (Signed)
Summary: [facility] Patient Summary  [facility] Patient Summary   Imported By: Sallee Provencal 01/28/2009 16:35:18  _____________________________________________________________________  External Attachment:    Type:   Image     Comment:   External Document

## 2010-06-02 NOTE — Progress Notes (Signed)
Summary: RX REFILL   Phone Note Call from Patient Call back at Home Phone [phone]   Caller: PT Reason for Call: Refill Medication Summary of Call: PT NEED [place] 20MG  CALLED INTO [facility] [phone]. Initial call taken by: Nevada Crane,  February 02, 2010 9:34 AM  Follow-up for Phone Call        RX refilled. Follow-up by: Jeani Hawking Via LPN,  October  4, 624THL 8:13 AM

## 2010-06-02 NOTE — Miscellaneous (Signed)
Summary: HEMOCCULT CARDS  Clinical Lists Changes  Observations: Added new observation of HEMOCCULT 3: NEG (11/17/2009 17:53) Added new observation of HEMOCCULT 2: NEG (11/17/2009 17:53) Added new observation of HEMOCCULT 1: NEG (11/17/2009 17:53)

## 2010-06-02 NOTE — Medication Information (Signed)
Summary: protime/tg  Anticoagulant Therapy  Managed by: Edrick Oh, RN PCP: Dr. Deno Lunger MD: Lattie Haw MD, Herbie Baltimore Indication 1: Atrial Fibrillation (ICD-427.31) Lab Used: [facility] [place] Site: [place] INR POC 2.8  Dietary changes: no    Health status changes: no    Bleeding/hemorrhagic complications: no    Recent/future hospitalizations: no    Any changes in medication regimen? no    Recent/future dental: no  Any missed doses?: no       Is patient compliant with meds? yes       Allergies: No Known Drug Allergies  Anticoagulation Management History:      The patient is taking warfarin and comes in today for a routine follow up visit.  Negative risk factors for bleeding include an age less than 74 years old.  The bleeding index is 'low risk'.  Positive CHADS2 values include History of HTN.  Negative CHADS2 values include Age > 58 years old.  The start date was 10/28/2008.  Anticoagulation responsible provider: Lattie Haw MD, Herbie Baltimore.  INR POC: 2.8.  Cuvette Lot#: [id number].  Exp: 10/11.    Anticoagulation Management Assessment/Plan:      The patient's current anticoagulation dose is Warfarin sodium 5 mg tabs: 1 tab once daily.  The target INR is 2 - 3.  The next INR is due 03/02/2010.  Anticoagulation instructions were given to patient.  Results were reviewed/authorized by Edrick Oh, RN.  He was notified by Edrick Oh RN.         Prior Anticoagulation Instructions: INR 2.9 Continue coumadin 5mg  once daily except 7.5mg  on Mondays, Wednesdays and Fridays  Current Anticoagulation Instructions: INR 2.8 Continue coumadin 5mg  once daily except 7.5mg  on Mondays, Wednesdays and Fridays

## 2010-06-02 NOTE — Medication Information (Signed)
Summary: ccr-last seen 12/19/08-lr  Anticoagulant Therapy  Managed by: Edrick Oh, RN Supervising MD: [name] MD, Collier Salina Indication 1: Atrial Fibrillation (ICD-427.31) Lab Used: [facility] [place] Site: [place] INR POC 2.4  Dietary changes: no    Health status changes: no    Bleeding/hemorrhagic complications: no    Recent/future hospitalizations: no    Any changes in medication regimen? no    Recent/future dental: no  Any missed doses?: no       Is patient compliant with meds? yes       Allergies: No Known Drug Allergies  Anticoagulation Management History:      The patient is taking warfarin and comes in today for a routine follow up visit.  Negative risk factors for bleeding include an age less than 58 years old.  The bleeding index is 'low risk'.  Positive CHADS2 values include History of HTN.  Negative CHADS2 values include Age > 62 years old.  The start date was 10/28/2008.  Anticoagulation responsible provider: [name] MD, Collier Salina.  INR POC: 2.4.  Cuvette Lot#: [id number].  Exp: 10/11.    Anticoagulation Management Assessment/Plan:      The patient's current anticoagulation dose is Warfarin sodium 5 mg tabs: 1 tab once daily.  The target INR is 2 - 3.  The next INR is due 02/03/2009.  Anticoagulation instructions were given to patient.  Results were reviewed/authorized by Edrick Oh, RN.  He was notified by Edrick Oh RN.         Prior Anticoagulation Instructions: INR 2.0 today  Take 1 1/2 tabs today then continue 1 tab a day except 1 1/2 tabs on Mondays, Wednesdays and Fridays  Current Anticoagulation Instructions: INR 2.4 today continue coumadin 5mg  once daily except 7.5mg  on M,W,F

## 2010-06-02 NOTE — Miscellaneous (Signed)
Summary: Orders Update: Lipid's and LFT's  Clinical Lists Changes  Orders: Added new Test order of T-Lipid Profile [id number]) - Signed Added new Test order of T-Hepatic Function [id number]) - Signed

## 2010-06-02 NOTE — Letter (Signed)
Summary: Appointment - Missed  [facility] [place]. [address] [place] [address]   Phone: [phone]  Fax: [phone]     December 01, 2009 MRN: [id number]   [address] [place] [address] [place], [place]  [address]   Dear Mr. Busenbark,  Our records indicate you missed your appointment on       12/01/09 [facility]            It is very important that we reach you to reschedule this appointment. We look forward to participating in your health care needs. Please contact us at the number listed above at your earliest convenience to reschedule this appointment.     Sincerely,    Public relations account executive

## 2010-06-02 NOTE — Letter (Signed)
Summary: RHYTHM STRIP 11-13-09  RHYTHM STRIP 11-13-09   Imported By: Nevada Crane 11/14/2009 12:55:00  _____________________________________________________________________  External Attachment:    Type:   Image     Comment:   External Document

## 2010-06-02 NOTE — Medication Information (Signed)
Summary: ccr-lr  Anticoagulant Therapy  Managed by: Edrick Oh, RN Supervising MD: Lattie Haw MD, Herbie Baltimore Indication 1: Atrial Fibrillation (ICD-427.31) Lab Used: [facility] [place] Site: [place] INR POC 3.5  Dietary changes: no    Health status changes: no    Bleeding/hemorrhagic complications: no    Recent/future hospitalizations: no    Any changes in medication regimen? no    Recent/future dental: no  Any missed doses?: no       Is patient compliant with meds? yes      Comments: Pt reports he got choked on a piece of beef this morning.  Gagged  and got meat up.  Has had several episodes of vomiting since then.  Last time he vomited there was bright red blood present.  INR 3.5 today.  Discussed with Arnold Long FNP.  Instructed pt/wife he needs to go the the ED to be evaluated.  Report called to triage nurse.  Allergies: No Known Drug Allergies  Anticoagulation Management History:      The patient is taking warfarin and comes in today for a routine follow up visit.  Negative risk factors for bleeding include an age less than 44 years old.  The bleeding index is 'low risk'.  Positive CHADS2 values include History of HTN.  Negative CHADS2 values include Age > 51 years old.  The start date was 10/28/2008.  Anticoagulation responsible provider: Lattie Haw MD, Herbie Baltimore.  INR POC: 3.5.  Cuvette Lot#: [id number].  Exp: 10/11.    Anticoagulation Management Assessment/Plan:      The patient's current anticoagulation dose is Warfarin sodium 5 mg tabs: 1 tab once daily.  The target INR is 2 - 3.  The next INR is due 06/25/2009.  Anticoagulation instructions were given to patient.  Results were reviewed/authorized by Edrick Oh, RN.  He was notified by Edrick Oh RN.         Prior Anticoagulation Instructions: INR 3.5 Hold couamdin tonight then resume 5mg  once daily except 7.5mg  on Mondays, Wednesdays and Fridays  Current Anticoagulation Instructions: INR 3.5 Hold  coumadin tonight then decrease dose to 5mg  once daily except  7.5mg  on Mondays or as instructed by ED after evaluation. Pt sent to ED for throwing up blood x 1 today.  Report given to triage nurse.

## 2010-06-02 NOTE — Miscellaneous (Signed)
  Clinical Lists Changes  Observations: Added new observation of RS STUDY: IMPROVE IT (04/04/2009 11:17)      Research Study Name: IMPROVE IT

## 2010-06-02 NOTE — Assessment & Plan Note (Signed)
Summary: [facility]  Medications Added PLAVIX 75 MG TABS (CLOPIDOGREL BISULFATE) 1 tab once daily METOPROLOL TARTRATE 25 MG TABS (METOPROLOL TARTRATE) 1 tab two times a day ASPIRIN 81 MG TBEC (ASPIRIN) 1 tab once daily NITROSTAT 0.4 MG SUBL (NITROGLYCERIN) as needed DAILY MULTI  TABS (MULTIPLE VITAMINS-MINERALS) 1 once daily LISINOPRIL 40 MG TABS (LISINOPRIL) 1 tab daily * RESEARCH STUDY DRUG (IMPROVE IT)  WARFARIN SODIUM 5 MG TABS (WARFARIN SODIUM) 1 tab once daily      Allergies Added: NKDA  Referring Provider:  [place]   History of Present Illness: [name] [name] returns to the office as scheduled for continued assessment and treatment of coronary artery disease and cardiovascular risk factors. He has done beautifully over the past year with no emergency department visits and no hospitalizations. He denies all cardiopulmonary symptoms despite moderate physical activity at times except for a single episode of left anterior chest discomfort without associated symptoms. This occurred after activity and warm weather. Symptoms persisted for approximately 45 minutes and were eventually relieved after sublingual nitroglycerin. He has had no recurrence despite activity at greater levels of intensity. He has completely discontinued cigarette smoking and has modified his diet and lost weight. He continues on protocol for lipid management, but no recent lipid profiles are available.  Current Medications (verified): 1)  Plavix 75 Mg Tabs (Clopidogrel Bisulfate) .[name].. 1 Tab Once Daily 2)  Metoprolol Tartrate 25 Mg Tabs (Metoprolol Tartrate) .[name].. 1 Tab Two Times A Day 3)  Aspirin 81 Mg Tbec (Aspirin) .[name].. 1 Tab Once Daily 4)  Nitrostat 0.4 Mg Subl (Nitroglycerin) .... As Needed 5)  Daily Multi  Tabs (Multiple Vitamins-Minerals) .[name].. 1 Once Daily 6)  Lisinopril 40 Mg Tabs (Lisinopril) .[name].. 1 Tab Daily 7)  Research Study Drug (Improve It)  Allergies (verified): No Known Drug Allergies  Vital  Signs:  Patient profile:   60 year old male Height:      72 inches Weight:      198 pounds BMI:     26.95  Physical Exam  General:  Well developed, well nourished, in no acute distress. Head:  normocephalic and atraumatic Eyes:  PERRLA/EOM intact; conjunctiva and lids normal.   EKG  Procedure date:  10/24/2008  Findings:      atrial fibrillation with a controlled ventricular response of 83 bpm; prior inferior myocardial infarction; slightly delayed R-wave progression; no change when compared to a previous tracing of 12/15/07.  Impression & Recommendations:  Problem # 1:  ATRIAL FLUTTER (ICD-427.32) Heart rate is well controlled in atrial fibrillation; he has been maintained on clopidogrel for coronary disease since a drug-eluting stent was placed in August of 2008; we will discontinue clopidogrel and resume warfarin to maintain an INR of 2-3.    Problem # 2:  HYPERTENSION (ICD-401.9) Blood pressure control is adequate. The patient and his wife monitor this at home with systolics typically in the 140s. He is cautioned to call should any elevation above this level occur consistently.  Problem # 3:  CORONARY ATHEROSCLEROSIS, NATIVE VESSEL (ICD-414.01) The patient's single episode of chest discomfort was somewhat atypical; he will call or seek urgent medical care should any similar or more impressive episodes occur.  Problem # 4:  HYPERLIPIDEMIA (B2193296.4) Lipid profile will be obtained.  Patient Instructions: 1)  Your physician recommends that you schedule a follow-up appointment in: 1year 2)  Your physician recommends that you return for lab work [id number] week 3)  You have been referred to coumadin clinic 4)  stool  card to be returned to office after 3 separate bowel movements Prescriptions: WARFARIN SODIUM 5 MG TABS (WARFARIN SODIUM) 1 tab once daily  #60 x 11   Entered by:   Jeani Hawking Via LPN   Authorized by:   Yehuda Savannah, MD, [facility]   Signed by:   Jeani Hawking Via LPN on  [id number]   Method used:   Electronically to        [facility]* (retail)       [address], [place]  [address]       Ph: [phone]       Fax: [phone]   RxID:   [id number]

## 2010-06-02 NOTE — Medication Information (Signed)
Summary: protime/tg  Anticoagulant Therapy  Managed by: Edrick Oh, RN PCP: Dr. Deno Lunger MD: Domenic Polite MD, Mikeal Hawthorne Indication 1: Atrial Fibrillation (ICD-427.31) Lab Used: [facility] [place] Site: [place] INR POC 2.0  Dietary changes: no    Health status changes: no    Bleeding/hemorrhagic complications: no    Recent/future hospitalizations: no    Any changes in medication regimen? no    Recent/future dental: no  Any missed doses?: no       Is patient compliant with meds? yes       Allergies: No Known Drug Allergies  Anticoagulation Management History:      The patient is taking warfarin and comes in today for a routine follow up visit.  Negative risk factors for bleeding include an age less than 27 years old.  The bleeding index is 'low risk'.  Positive CHADS2 values include History of HTN.  Negative CHADS2 values include Age > 48 years old.  The start date was 10/28/2008.  Anticoagulation responsible provider: Domenic Polite MD, Mikeal Hawthorne.  INR POC: 2.0.  Cuvette Lot#: [id number].  Exp: 10/11.    Anticoagulation Management Assessment/Plan:      The patient's current anticoagulation dose is Warfarin sodium 5 mg tabs: 1 tab once daily.  The target INR is 2 - 3.  The next INR is due 01/01/2010.  Anticoagulation instructions were given to patient.  Results were reviewed/authorized by Edrick Oh, RN.  He was notified by Edrick Oh RN.         Prior Anticoagulation Instructions: INR 2.4 Continue coumadin 5mg  once daily except 7.5mg  on Mondays, Wednesdays and Fridays  Current Anticoagulation Instructions: INR 2.0 Take coumadin 1 1/2 tablets tonight then resume 1 tablet once daily except 1 1/2 tablets on Mondays, Wednesdays and Fridays

## 2010-06-02 NOTE — Medication Information (Signed)
Summary: per wife's phone call /sn  Anticoagulant Therapy  Managed by: Edrick Oh, RN Supervising MD: Lattie Haw MD, Herbie Baltimore Indication 1: Atrial Fibrillation (ICD-427.31) Lab Used: [facility] [place] Site: [place] INR POC 3.3  Dietary changes: no    Health status changes: no    Bleeding/hemorrhagic complications: no    Recent/future hospitalizations: no    Any changes in medication regimen? no    Recent/future dental: no  Any missed doses?: no       Is patient compliant with meds? yes       Allergies: No Known Drug Allergies  Anticoagulation Management History:      The patient is taking warfarin and comes in today for a routine follow up visit.  Negative risk factors for bleeding include an age less than 71 years old.  The bleeding index is 'low risk'.  Positive CHADS2 values include History of HTN.  Negative CHADS2 values include Age > 57 years old.  The start date was 10/28/2008.  Anticoagulation responsible provider: Lattie Haw MD, Herbie Baltimore.  INR POC: 3.3.  Cuvette Lot#: [id number].  Exp: 10/11.    Anticoagulation Management Assessment/Plan:      The patient's current anticoagulation dose is Warfarin sodium 5 mg tabs: 1 tab once daily.  The target INR is 2 - 3.  The next INR is due 07/28/2009.  Anticoagulation instructions were given to patient.  Results were reviewed/authorized by Edrick Oh, RN.  He was notified by Edrick Oh RN.         Prior Anticoagulation Instructions: INR 3.5 Hold coumadin tonight then decrease dose to 5mg  once daily except  7.5mg  on Mondays or as instructed by ED after evaluation. Pt sent to ED for throwing up blood x 1 today.  Report given to triage nurse.  Current Anticoagulation Instructions: INR 3.3 Take coumadin 1/2 tablet tonight then decrease dose to 1 tablet once daily

## 2010-06-02 NOTE — Medication Information (Signed)
Summary: [facility]  Anticoagulant Therapy  Managed by: Edrick Oh, RN Supervising MD: Lattie Haw MD, Herbie Baltimore Indication 1: Atrial Fibrillation (ICD-427.31) Lab Used: [facility] [place] Site: [facility] INR POC 2.0  Dietary changes: no    Health status changes: no    Bleeding/hemorrhagic complications: no    Recent/future hospitalizations: no    Any changes in medication regimen? no    Recent/future dental: no  Any missed doses?: no       Is patient compliant with meds? yes       Allergies (verified): No Known Drug Allergies  Anticoagulation Management History:      The patient is taking warfarin and comes in today for a routine follow up visit.  Negative risk factors for bleeding include an age less than 72 years old.  The bleeding index is 'low risk'.  Positive CHADS2 values include History of HTN.  Negative CHADS2 values include Age > 74 years old.  The start date was 10/28/2008.  Anticoagulation responsible provider: Lattie Haw MD, Herbie Baltimore.  INR POC: 2.0.  Cuvette Lot#: [id number].  Exp: 10/11.    Anticoagulation Management Assessment/Plan:      The patient's current anticoagulation dose is Warfarin sodium 5 mg tabs: 1 tab once daily.  The target INR is 2 - 3.  The next INR is due 01/09/2009.  Anticoagulation instructions were given to patient.  Results were reviewed/authorized by Edrick Oh, RN.  He was notified by Edrick Oh RN.         Prior Anticoagulation Instructions: 5MG  QD/7.5MG  M,W,F  Current Anticoagulation Instructions: INR 2.0 today  Take 1 1/2 tabs today then continue 1 tab a day except 1 1/2 tabs on Mondays, Wednesdays and Fridays

## 2010-06-02 NOTE — Miscellaneous (Signed)
Summary: LABS CBCD,BMP,12/15/2007  Clinical Lists Changes  Observations: Added new observation of CALCIUM: 9.6 mg/dL (12/15/2007 8:44) Added new observation of CREATININE: 1.06 mg/dL (12/15/2007 8:44) Added new observation of BUN: 12 mg/dL (12/15/2007 8:44) Added new observation of BG RANDOM: 117 mg/dL (12/15/2007 8:44) Added new observation of CO2 PLSM/SER: 28 meq/L (12/15/2007 8:44) Added new observation of CL SERUM: 101 meq/L (12/15/2007 8:44) Added new observation of K SERUM: 4.2 meq/L (12/15/2007 8:44) Added new observation of NA: 137 meq/L (12/15/2007 8:44) Added new observation of PLATELETK/UL: 166 K/uL (12/15/2007 8:44) Added new observation of MCV: 88.5 fL (12/15/2007 8:44) Added new observation of HCT: 47.9 % (12/15/2007 8:44) Added new observation of HGB: 16.3 g/dL (12/15/2007 8:44) Added new observation of WBC COUNT: 5.8 10*3/microliter (12/15/2007 8:44)

## 2010-06-02 NOTE — Medication Information (Signed)
Summary: [facility] Patient Summary  [facility] Patient Summary   Imported By: Sallee Provencal 01/29/2009 10:21:46  _____________________________________________________________________  External Attachment:    Type:   Image     Comment:   External Document

## 2010-06-02 NOTE — Miscellaneous (Signed)
Summary: [facility] Physical Exam  [facility] Exam   Imported By: Sallee Provencal 01/15/2009 16:35:38  _____________________________________________________________________  External Attachment:    Type:   Image     Comment:   External Document

## 2010-06-02 NOTE — Letter (Signed)
Summary: Appointment - Missed  [place], [place]    Phone:   Fax:      January 29, 2010 MRN: [id number]   [address] [place], [place]  [address]   Dear [name] [name],  Our records indicate you missed your appointment on         01/29/10 [facility]                It is very important that we reach you to reschedule this appointment. We look forward to participating in your health care needs. Please contact us at the number listed above at your earliest convenience to reschedule this appointment.     Sincerely,    Public relations account executive

## 2010-06-02 NOTE — Medication Information (Signed)
Summary: ccr-lr  Anticoagulant Therapy  Managed by: Edrick Oh, RN Supervising MD: Lattie Haw MD, Herbie Baltimore Indication 1: Atrial Fibrillation (ICD-427.31) Lab Used: [facility] [place] Site: [place] INR POC 2.0  Dietary changes: no    Health status changes: no    Bleeding/hemorrhagic complications: no    Recent/future hospitalizations: no    Any changes in medication regimen? no    Recent/future dental: no  Any missed doses?: no       Is patient compliant with meds? yes       Allergies: No Known Drug Allergies  Anticoagulation Management History:      The patient is taking warfarin and comes in today for a routine follow up visit.  Negative risk factors for bleeding include an age less than 2 years old.  The bleeding index is 'low risk'.  Positive CHADS2 values include History of HTN.  Negative CHADS2 values include Age > 9 years old.  The start date was 10/28/2008.  Anticoagulation responsible provider: Lattie Haw MD, Herbie Baltimore.  INR POC: 2.0.  Cuvette Lot#: [id number].  Exp: 10/11.    Anticoagulation Management Assessment/Plan:      The patient's current anticoagulation dose is Warfarin sodium 5 mg tabs: 1 tab once daily.  The target INR is 2 - 3.  The next INR is due 08/25/2009.  Anticoagulation instructions were given to patient.  Results were reviewed/authorized by Edrick Oh, RN.  He was notified by Edrick Oh RN.         Prior Anticoagulation Instructions: INR 3.3 Take coumadin 1/2 tablet tonight then decrease dose to 1 tablet once daily   Current Anticoagulation Instructions: INR 2.0 Imcrease coumadin to 5mg  once daily except 7.5mg  on Mondays

## 2010-06-02 NOTE — Letter (Signed)
Summary: [facility], general practice at [place]. [address] [place] [address]   Phone: [phone]  Fax: [phone]     November 07, 2009 MRN: [id number]   [facility] [place] [place], [place]  [address]      YOUR LAB WORK IS DUE  November 10, 2009 _________________________________________  Please go to [facility], located across the street from [facility] on the second floor.  Hours are Monday - Friday 7am until 7:30pm         Saturday 8am until 12noon    _X_  DO NOT EAT OR DRINK AFTER MIDNIGHT EVENING PRIOR TO LABWORK  __ YOUR LABWORK IS NOT FASTING --YOU MAY EAT PRIOR TO LABWORK

## 2010-06-02 NOTE — Medication Information (Signed)
Summary: ccr-lr  Anticoagulant Therapy  Managed by: Edrick Oh, RN Supervising MD: [name] MD, Collier [place] Indication 1: Atrial Fibrillation (ICD-427.31) Lab Used: [facility] [place] Site: [place] INR POC 2.4  Dietary changes: no    Health status changes: no    Bleeding/hemorrhagic complications: no    Recent/future hospitalizations: no    Any changes in medication regimen? no    Recent/future dental: no  Any missed doses?: no       Is patient compliant with meds? yes       Allergies: No Known Drug Allergies  Anticoagulation Management History:      The patient is taking warfarin and comes in today for a routine follow up visit.  Negative risk factors for bleeding include an age less than 49 years old.  The bleeding index is 'low risk'.  Positive CHADS2 values include History of HTN.  Negative CHADS2 values include Age > 41 years old.  The start date was 10/28/2008.  Anticoagulation responsible provider: [name] MD, Collier [place].  INR POC: 2.4.  Cuvette Lot#: [id number].  Exp: 10/11.    Anticoagulation Management Assessment/Plan:      The patient's current anticoagulation dose is Warfarin sodium 5 mg tabs: 1 tab once daily.  The target INR is 2 - 3.  The next INR is due 12/01/2009.  Anticoagulation instructions were given to patient.  Results were reviewed/authorized by Edrick Oh, RN.  He was notified by Edrick Oh RN.         Prior Anticoagulation Instructions: INR 3.1 Take 1 tablet tonight then resume 5mg  once daily except 7.5mg  on Mondays, Wednesdays and Fridays   Current Anticoagulation Instructions: INR 2.4 Continue coumadin 5mg  once daily except 7.5mg  on Mondays, Wednesdays and Fridays

## 2010-06-02 NOTE — Medication Information (Signed)
Summary: ccr-lr  Anticoagulant Therapy  Managed by: Edrick Oh, RN Supervising MD: Lattie Haw MD, Herbie [place] Indication 1: Atrial Fibrillation (ICD-427.31) Lab Used: [facility] [place] Site: [place] INR POC 3.1  Dietary changes: no    Health status changes: no    Bleeding/hemorrhagic complications: no    Recent/future hospitalizations: no    Any changes in medication regimen? no    Recent/future dental: no  Any missed doses?: no       Is patient compliant with meds? yes       Allergies: No Known Drug Allergies  Anticoagulation Management History:      The patient is taking warfarin and comes in today for a routine follow up visit.  Negative risk factors for bleeding include an age less than 61 years old.  The bleeding index is 'low risk'.  Positive CHADS2 values include History of HTN.  Negative CHADS2 values include Age > 67 years old.  The start date was 10/28/2008.  Anticoagulation responsible Arin Vanosdol: Lattie Haw MD, Herbie [place].  INR POC: 3.1.  Cuvette Lot#: [id number].  Exp: 10/11.    Anticoagulation Management Assessment/Plan:      The patient's current anticoagulation dose is Warfarin sodium 5 mg tabs: 1 tab once daily.  The target INR is 2 - 3.  The next INR is due 05/01/2009.  Anticoagulation instructions were given to patient.  Results were reviewed/authorized by Edrick Oh, RN.  He was notified by Edrick Oh RN.         Prior Anticoagulation Instructions: INR 2.2 today Continue coumadin 5mg  once daily except 7.5mg  on Mondays, Wednesdays and Fridays  Current Anticoagulation Instructions: INR 3.1 Take coumadin 1/2 tablet tonight then resume 1 tablet once daily except 1 1/2 tablet on Mondays, Wednesdays and Fridays

## 2010-06-02 NOTE — Miscellaneous (Signed)
Summary: Refill: Metoprolol  Clinical Lists Changes  Medications: Changed medication from METOPROLOL TARTRATE 25 MG TABS (METOPROLOL TARTRATE) 1 tab two times a day to METOPROLOL TARTRATE 25 MG TABS (METOPROLOL TARTRATE) 1 tab two times a day - Signed Rx of METOPROLOL TARTRATE 25 MG TABS (METOPROLOL TARTRATE) 1 tab two times a day;  #60 x 6;  Signed;  Entered by: Jeani Hawking Via LPN;  Authorized by: Yehuda Savannah, MD, [facility];  Method used: Electronically to [facility] [address], [place], [place], [place]  [address], Ph: [phone], Fax: [phone]    Prescriptions: METOPROLOL TARTRATE 25 MG TABS (METOPROLOL TARTRATE) 1 tab two times a day  #60 x 6   Entered by:   Jeani Hawking Via LPN   Authorized by:   Yehuda Savannah, MD, [facility]   Signed by:   Jeani Hawking Via LPN on [id number]   Method used:   Electronically to        [facility] (retail)       [address], [place]  [address]       Ph: [phone]       Fax: [phone]   RxID:   [id number]

## 2010-06-03 ENCOUNTER — Inpatient Hospital Stay (HOSPITAL_COMMUNITY): Payer: MEDICARE

## 2010-06-03 DIAGNOSIS — I2510 Atherosclerotic heart disease of native coronary artery without angina pectoris: Secondary | ICD-10-CM

## 2010-06-03 DIAGNOSIS — I4891 Unspecified atrial fibrillation: Secondary | ICD-10-CM

## 2010-06-03 LAB — BASIC METABOLIC PANEL
BUN: 19 mg/dL (ref 6–23)
CO2: 23 mEq/L (ref 19–32)
Calcium: 9.8 mg/dL (ref 8.4–10.5)
Chloride: 102 mEq/L (ref 96–112)
Creatinine, Ser: 1.27 mg/dL (ref 0.4–1.5)
GFR calc Af Amer: >60 mL/min (ref >60)
GFR calc non Af Amer: 58 mL/min — ABNORMAL LOW (ref >60)
Glucose, Bld: 150 mg/dL — ABNORMAL HIGH (ref 70–99)
Potassium: 4.3 mEq/L (ref 3.5–5.1)
Sodium: 137 mEq/L (ref 135–145)

## 2010-06-03 LAB — HEPARIN LEVEL (UNFRACTIONATED)
Heparin Unfractionated: 0.28 IU/mL — ABNORMAL LOW (ref 0.30–0.70)
Heparin Unfractionated: 0.55 IU/mL (ref 0.30–0.70)

## 2010-06-03 LAB — CBC
HCT: 47 % (ref 39.0–52.0)
Hemoglobin: 16.4 g/dL (ref 13.0–17.0)
MCH: 30.4 pg (ref 26.0–34.0)
MCHC: 34.9 g/dL (ref 30.0–36.0)
MCV: 87.2 fL (ref 78.0–100.0)
Platelets: 151 10*3/uL (ref 150–400)
RBC: 5.39 MIL/uL (ref 4.22–5.81)
RDW: 13.7 % (ref 11.5–15.5)
WBC: 6.3 10*3/uL (ref 4.0–10.5)

## 2010-06-03 LAB — PROTIME-INR
INR: 1.36 (ref 0.00–1.49)
Prothrombin Time: 17 seconds — ABNORMAL HIGH (ref 11.6–15.2)

## 2010-06-03 MED ORDER — GADOPENTETATE DIMEGLUMINE 469.01 MG/ML IV SOLN
0.2000 mmol/kg | Freq: Once | INTRAVENOUS | Status: DC
  Administered 2010-06-03: 45 mL via INTRAVENOUS

## 2010-06-04 ENCOUNTER — Ambulatory Visit: Admit: 2010-06-04 | Payer: Self-pay

## 2010-06-04 LAB — BASIC METABOLIC PANEL
BUN: 22 mg/dL (ref 6–23)
BUN: 24 mg/dL — ABNORMAL HIGH (ref 6–23)
CO2: 25 mEq/L (ref 19–32)
CO2: 24 mEq/L (ref 19–32)
Calcium: 10.1 mg/dL (ref 8.4–10.5)
Calcium: 9.6 mg/dL (ref 8.4–10.5)
Chloride: 98 mEq/L (ref 96–112)
Chloride: 97 mEq/L (ref 96–112)
Creatinine, Ser: 1.47 mg/dL (ref 0.4–1.5)
Creatinine, Ser: 1.39 mg/dL (ref 0.4–1.5)
GFR calc Af Amer: 60 mL/min — ABNORMAL LOW (ref >60)
GFR calc Af Amer: >60 mL/min (ref >60)
GFR calc non Af Amer: 49 mL/min — ABNORMAL LOW (ref >60)
GFR calc non Af Amer: 53 mL/min — ABNORMAL LOW (ref >60)
Glucose, Bld: 118 mg/dL — ABNORMAL HIGH (ref 70–99)
Glucose, Bld: 119 mg/dL — ABNORMAL HIGH (ref 70–99)
Potassium: 5.5 mEq/L — ABNORMAL HIGH (ref 3.5–5.1)
Potassium: 5.2 mEq/L — ABNORMAL HIGH (ref 3.5–5.1)
Sodium: 134 mEq/L — ABNORMAL LOW (ref 135–145)
Sodium: 131 mEq/L — ABNORMAL LOW (ref 135–145)

## 2010-06-04 LAB — CBC
HCT: 46.7 % (ref 39.0–52.0)
Hemoglobin: 16.3 g/dL (ref 13.0–17.0)
MCH: 30.5 pg (ref 26.0–34.0)
MCHC: 34.9 g/dL (ref 30.0–36.0)
MCV: 87.5 fL (ref 78.0–100.0)
Platelets: 133 10*3/uL — ABNORMAL LOW (ref 150–400)
RBC: 5.34 MIL/uL (ref 4.22–5.81)
RDW: 13.7 % (ref 11.5–15.5)
WBC: 8 10*3/uL (ref 4.0–10.5)

## 2010-06-04 LAB — HEPARIN LEVEL (UNFRACTIONATED): Heparin Unfractionated: 0.66 IU/mL (ref 0.30–0.70)

## 2010-06-04 LAB — PROTIME-INR
INR: 1.74 — ABNORMAL HIGH (ref 0.00–1.49)
Prothrombin Time: 20.5 seconds — ABNORMAL HIGH (ref 11.6–15.2)

## 2010-06-04 NOTE — Procedures (Signed)
  NAME:  [name] [name], [name] [name]               ACCOUNT NO.:  [id number]  MEDICAL RECORD NO.:  [id number]          PATIENT TYPE:  OBV  LOCATION:  2032                         FACILITY:  [facility]  PHYSICIAN:  Wallis Bamberg. [name], MD, FACCDATE OF BIRTH:  19-Nov-1950  DATE OF PROCEDURE:  06/01/2010 DATE OF DISCHARGE:                           CARDIAC CATHETERIZATION   INDICATIONS:  A patient 60 year old patient admitted with chest pain, known totally occluded right coronary artery with collaterals, previous stent to the OM circ.  Cine catheterization was done from the right radial artery.  JL-3.5 and JR-4 catheters were used to engage the coronaries.  The patient received 3 mg of verapamil.  He also received 2 mg of Versed, 25 mcg of fentanyl, and 4000 units of heparin were given.  Left main coronary artery was normal.  Left anterior descending artery was normal proximally.  The distal LAD was a small-sized vessel.  There was a diffusely diseased small first diagonal branch with 70-80% diffuse disease.  This has not changed since the previous cath in 2008.  Second diagonal branch was a large vessel and normal.  The circumflex coronary artery was nondominant.  It was normal.  There was a large obtuse marginal branch with a long- stented area which was widely patent.  There were left-to-right collaterals to the right coronary artery which were seen previously.  The right coronary artery was known to be occluded.  It was injected and it was occluded proximally with faint right-to-right collaterals.  The Left-to-right collaterals supplied the PDA and posterolateral branches better.  RAO ventriculography:  RAO ventriculography showed inferior wall hypokinesis.  Ejection fraction was 40-45%.  There appeared to be mural apical thrombus.  Aortic pressure was 125/80, LV pressure was 129/30.  IMPRESSION:  The patient's chest pain would appear to be noncardiac. His coronary anatomy is stable since  2008.  His stent is widely patent and he has a collateralized RCA which is old.  The first diagonal branch is very small and diffusely diseased and also looks identical to his films from 2008 where Dr. Lia Foyer studied them.  At the end of the case, he had good hemostasis with the TR band.  The patient will have to stay in the hospital until he is re- anticoagulated given his atrial fibrillation and also what appear to be a mural apical thrombus.  Further definition of the apex of the heart may be in order using MRI or contrast echo with definity.     Wallis Bamberg. [name], MD, [facility]     PCN/MEDQ  D:  06/01/2010  T:  06/02/2010  Job:  [id number]  Electronically Signed by Jenkins Rouge MD [facility] on 06/04/2010 10:39:48 PM

## 2010-06-04 NOTE — Medication Information (Signed)
Summary: ccr-lr  Anticoagulant Therapy  Managed by: Edrick Oh, RN PCP: Dr. Deno Lunger MD: Lattie Haw MD, Herbie [place] Indication 1: Atrial Fibrillation (ICD-427.31) Lab Used: [place] Site: [place] INR POC 2.1  Dietary changes: no    Health status changes: no    Bleeding/hemorrhagic complications: no    Recent/future hospitalizations: no    Any changes in medication regimen? no    Recent/future dental: no  Any missed doses?: no       Is patient compliant with meds? yes       Allergies: No Known Drug Allergies  Anticoagulation Management History:      The patient is taking warfarin and comes in today for a routine follow up visit.  Negative risk factors for bleeding include an age less than 87 years old.  The bleeding index is 'low risk'.  Positive CHADS2 values include History of HTN.  Negative CHADS2 values include Age > 14 years old.  The start date was 10/28/2008.  Anticoagulation responsible provider: Lattie Haw MD, Herbie [place].  INR POC: 2.1.  Cuvette Lot#: [id number].  Exp: 10/11.    Anticoagulation Management Assessment/Plan:      The patient's current anticoagulation dose is Warfarin sodium 5 mg tabs: 1 tab once daily.  The target INR is 2 - 3.  The next INR is due 06/04/2010.  Anticoagulation instructions were given to patient.  Results were reviewed/authorized by Edrick Oh, RN.  He was notified by Edrick Oh RN.         Prior Anticoagulation Instructions: INR 2.7 Continue coumadin 5mg  once daily except 7.5mg  on Mondays. Wednesdays and Fridays  Current Anticoagulation Instructions: INR 2.1 Continue coumadin 5mg  once daily except 7.5mg  on Mondays, Wednesdays and Fridays

## 2010-06-04 NOTE — Assessment & Plan Note (Signed)
Summary: pt having increased C/P w/ increased use of NTG/tg      Allergies Added: NKDA  Visit Type:  Follow-up Referring Provider:  . Primary Provider:  Dr. Edrick Oh   History of Present Illness: [name] [name] is a  60 y/o CM with known history of CAD, s/p AMI 2000, Cath at [facility] in 2008 with chronic total obstruction of the RCA with DES placed in marginal 1, with inferior hypokinesis with EF of 45%. 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  He presents today because of chest pain described as shart from the left side of his chest radiating to the right side, left arm and shoulder pain that has been coming and going since 05/26/2010 evening.  He took a tums for the pain and it lessened only a little.  He has NTG but states that it was old. Bought some new NTG tablets this morning and had some relief with these. He describes the pain as the same pain he had just prior to stent placement in 2008 but not as severe.  He denies diaphoresis, dizziness or associated chest pain.  The patient is very sedintary and but does walk across his yard and drive and was experiencing chest pain during these activities. He has similar pain about 6 months ago, but the pain went away on its own. He did not seek medical attention at that time.  He became concerned about pain returning  and sought medical attention in our office today.  Preventive Screening-Counseling & Management  Alcohol-Tobacco     Alcohol drinks/day: 0     Smoking Status: quit > 6 months  Current Medications (verified): 1)  Metoprolol Tartrate 25 Mg Tabs (Metoprolol Tartrate) .[name].. 1 Tab Two Times A Day 2)  Nitrostat 0.4 Mg Subl (Nitroglycerin) .... As Needed 3)  Daily Multi  Tabs (Multiple Vitamins-Minerals) .[name].. 1 Once Daily 4)  Lisinopril 20 Mg Tabs (Lisinopril) .... Take Two  Tablets  By Mouth Daily 5)   Research Study Drug (Improve It) 6)  Warfarin Sodium 5 Mg Tabs (Warfarin Sodium) .[name].. 1 Tab Once Daily  Allergies (verified): No Known Drug Allergies  Comments:  Nurse/Medical Assistant: patient brought meds he [facility] [place] he is in a cholesterol  study program  Past History:  Past medical, surgical, family and social histories (including risk factors) reviewed, and no changes noted (except as noted below).  Past Medical History: Reviewed history from 11/13/2009 and no changes required. 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 aorta NEPHROLITHIASIS (ICD-592.0)  Past Surgical History: Reviewed history from 10/22/2008 and no changes required. appendectomy 2004 stent 2002  Family History: Reviewed history from 10/22/2008 and no changes required. Father:deceased due to emphysemia and colon cancer Mother:deceased age 30 sue to myocardial infarction  Social History: Reviewed history from 11/13/2009 and no changes required. Disabled  Married  Tobacco Use -20 pack years discontinued in 2009 Alcohol Use - no Regular Exercise -  no Drug Use - no pt has 2 brothers 1 is deceased due to complications of asthma Smoking Status:  quit > 6 months Alcohol drinks/day:  0  Review of Systems       Recurrent chest pain at rest All other systems have been reviewed and are negative unless stated above.   Vital Signs:  Patient profile:   60 year old male Weight:      197 pounds BMI:     26.81 O2 Sat:      97 % on Room  air Pulse rate:   97 / minute BP sitting:   174 / 84  (left arm)  Vitals Entered By: Doretha Sou, CNA (May 28, 2010 2:15 PM)  O2 Flow:  Room air  Physical Exam  General:  Well developed, well nourished, in no acute distress. Head:  normocephalic and atraumatic Eyes:  PERRLA/EOM intact; conjunctiva and lids normal. Neck:  Neck supple, no JVD. No masses, thyromegaly or abnormal cervical nodes. Lungs:  Clear bilaterally to auscultation and percussion. Heart:  Non-displaced PMI, chest non-tender; regular rate and rhythm, S1, S2 without murmurs, rubs or gallops. Carotid upstroke normal, no bruit. Normal abdominal aortic size, no bruits. Femorals normal pulses, no bruits. Pedals normal pulses. No edema, no varicosities. Abdomen:  Bowel sounds positive; abdomen soft and non-tender without masses, organomegaly, or hernias noted. No hepatosplenomegaly. Soft murmur in the lower abdomen Msk:  Back normal, normal gait. Muscle strength and tone normal. Pulses:  pulses normal in all 4 extremities Extremities:  No clubbing or cyanosis. Neurologic:  Alert and oriented x 3. Psych:  Normal affect.   EKG  Procedure date:  05/28/2010  Findings:      Atrial fibrillation with a controlled ventricular response rate of: 90 bpm  Comments:      Evidence of remote inferior MI.    Impression & Recommendations:  Problem # 1:  ATHEROSCLEROTIC CARDIOVASCULAR DISEASE (ICD-429.2) Pt is having typical and antypical symptoms of unstable angina as he has symptoms similar to those he experienced with previous MI and prior to stent placement.   We will [facility] to [facility].  Stop coumadin and begin heparin gitt. Will cycle cardiac enzymes, check PT-INR, CBC, CMET.  He will continue on BB and ASA. Plan cath when he is therapeutic on INR. The patient has been seen and examined by myself and by Dr. Lattie Haw in the [place] office.  Discussion with the patient concerning need to transfer to [facility] for cath is completed.  He verbalizes understanding and is willing to proceed. He will be admitted in observation status.  Problem # 2:  HYPERTENSION (ICD-401.9) Currently elevated here in the office in the setting of ongoing chest discomfort. Will place on NTG drip. His updated medication list for this problem includes:    Metoprolol Tartrate 25 Mg Tabs (Metoprolol tartrate) .[name]... 1 tab two times a day    Lisinopril 20 Mg Tabs (Lisinopril) .[name]... Take two  tablets  by mouth daily  Problem # 3:  ATRIAL FIBRILLATION (ICD-427.31) Rate is controlled but coumadin will be held for cath.  Heparin will be used instead. His updated medication list for this problem includes:    Metoprolol Tartrate 25 Mg Tabs (Metoprolol tartrate) .[name]... 1 tab two times a day    Warfarin Sodium 5 Mg Tabs (Warfarin sodium) .[name]... 1 tab once daily

## 2010-06-05 DIAGNOSIS — I200 Unstable angina: Secondary | ICD-10-CM

## 2010-06-05 LAB — PROTIME-INR
INR: 1.93 — ABNORMAL HIGH (ref 0.00–1.49)
Prothrombin Time: 22.2 seconds — ABNORMAL HIGH (ref 11.6–15.2)

## 2010-06-05 LAB — CBC
HCT: 49 % (ref 39.0–52.0)
Hemoglobin: 17 g/dL (ref 13.0–17.0)
MCH: 30.2 pg (ref 26.0–34.0)
MCHC: 34.7 g/dL (ref 30.0–36.0)
MCV: 87 fL (ref 78.0–100.0)
Platelets: 135 10*3/uL — ABNORMAL LOW (ref 150–400)
RBC: 5.63 MIL/uL (ref 4.22–5.81)
RDW: 13.8 % (ref 11.5–15.5)
WBC: 7.9 10*3/uL (ref 4.0–10.5)

## 2010-06-05 LAB — BASIC METABOLIC PANEL
BUN: 24 mg/dL — ABNORMAL HIGH (ref 6–23)
CO2: 24 mEq/L (ref 19–32)
Calcium: 9.6 mg/dL (ref 8.4–10.5)
Chloride: 98 mEq/L (ref 96–112)
Creatinine, Ser: 1.45 mg/dL (ref 0.4–1.5)
GFR calc Af Amer: >60 mL/min (ref >60)
GFR calc non Af Amer: 50 mL/min — ABNORMAL LOW (ref >60)
Glucose, Bld: 93 mg/dL (ref 70–99)
Potassium: 4.4 mEq/L (ref 3.5–5.1)
Sodium: 135 mEq/L (ref 135–145)

## 2010-06-05 LAB — HEPARIN LEVEL (UNFRACTIONATED): Heparin Unfractionated: 0.62 IU/mL (ref 0.30–0.70)

## 2010-06-08 ENCOUNTER — Telehealth: Payer: Self-pay | Admitting: Adult Health

## 2010-06-10 ENCOUNTER — Encounter (INDEPENDENT_AMBULATORY_CARE_PROVIDER_SITE_OTHER): Payer: MEDICARE

## 2010-06-10 ENCOUNTER — Encounter: Payer: Self-pay | Admitting: Cardiology

## 2010-06-10 DIAGNOSIS — Z7901 Long term (current) use of anticoagulants: Secondary | ICD-10-CM

## 2010-06-10 DIAGNOSIS — I4891 Unspecified atrial fibrillation: Secondary | ICD-10-CM

## 2010-06-10 LAB — CONVERTED CEMR LAB: POC INR: 2.9

## 2010-06-18 NOTE — Medication Information (Signed)
Summary: post hosp protime/tg  Anticoagulant Therapy Managed by: Edrick Oh, RN Patient Assessment Part 2:  Have you MISSED ANY DOSES or CHANGED TABLETS?  0  Have you had any BRUISING or BLEEDING ( nose or gum bleeds,blood in urine or stool)?  Have you STARTED or STOPPED any MEDICATIONS, including OTC meds,herbals or supplements?  Have you CHANGED your DIET, especially green vegetables,or ALCOHOL intake?  Have you had any ILLNESSES or HOSPITALIZATIONS?  Have you had any signs of CLOTTING?(chest discomfort,dizziness,shortness of breath,arms tingling,slurred speech,swelling or redness in leg)       Regimen Out:    Total Weekly: 42.50 mg mg  Next INR Due: 06/24/2010      Allergies: No Known Drug Allergies  Anticoagulant Therapy  Managed by: Edrick Oh, RN PCP: Dr. Deno Lunger MD: Lattie Haw MD, Herbie [place] Indication 1: Atrial Fibrillation (ICD-427.31) Lab Used: [place] Site: [place] INR POC 2.9  Dietary changes: no    Health status changes: no    Bleeding/hemorrhagic complications: no    Recent/future hospitalizations: yes       [facility] - 06/05/10  Any changes in medication regimen? no    Recent/future dental: no  Any missed doses?: no       Is patient compliant with meds? yes         Anticoagulation Management History:      The patient is taking warfarin and comes in today for a routine follow up visit.  Negative risk factors for bleeding include an age less than 11 years old.  The bleeding index is 'low risk'.  Positive CHADS2 values include History of HTN.  Negative CHADS2 values include Age > 58 years old.  The start date was 10/28/2008.  Anticoagulation responsible provider: Lattie Haw MD, Herbie [place].  INR POC: 2.9.  Cuvette Lot#: [id number].  Exp: 10/11.    Anticoagulation Management Assessment/Plan:      The patient's current anticoagulation dose is Warfarin sodium 5 mg tabs: 1 tab once  daily.  The target INR is 2 - 3.  The next INR is due 06/24/2010.  Anticoagulation instructions were given to patient.  Results were reviewed/authorized by Edrick Oh, RN.  He was notified by Edrick Oh RN.         Prior Anticoagulation Instructions: INR 2.1 Continue coumadin 5mg  once daily except 7.5mg  on Mondays, Wednesdays and Fridays  Current Anticoagulation Instructions: INR 2.9 Continue coumadin 5mg  once daily except 7.5mg  on Mondays, Wednesdays and Fridays

## 2010-06-18 NOTE — Progress Notes (Signed)
Summary: Side Effects of Medication started in hospital    Phone Note Call from Patient Call back at [phone]   Caller: Spouse Reason for Call: Talk to Nurse Summary of Call: patient's wife states that he was started on Carvedilol in the hospital / states that he is having some side effects like nausea, diarhea, and dizziness / tg Initial call taken by: Alphonsus Sias [facility],  June 08, 2010 9:56 AM  Follow-up for Phone Call        S: pt having nausea, vomiting and dizziness B: pt was tried on carvedilol in hospital and stopped due to side effects then was discharged on it A: he has again started with nausea, vomiting and diarhea R: he has stopped carvedilol and resumed his metorpolol tart 25mg  two times a day  Follow-up by: Tye Savoy RN,  June 08, 2010 2:34 PM    Ok.  We can reassess on follow-up appointment.  No need to restart carvediolol if he cannot tolerate this.  Jory Sims NP  Appended Document: Side Effects of Medication started in hospital  pt made aware ok to take metoprolol until return to office

## 2010-06-22 ENCOUNTER — Encounter: Payer: MEDICARE | Admitting: Adult Health

## 2010-06-25 ENCOUNTER — Encounter: Payer: MEDICARE | Admitting: Adult Health

## 2010-06-26 NOTE — Discharge Summary (Signed)
NAME:  [name] [name]               ACCOUNT NO.:  [id number]  MEDICAL RECORD NO.:  [id number]           PATIENT TYPE:  I  LOCATION:  2032                         FACILITY:  [facility]  PHYSICIAN:  Loralie Champagne, MD      DATE OF BIRTH:  [birth year]  DATE OF ADMISSION:  05/28/2010 DATE OF DISCHARGE:  06/05/2010                              DISCHARGE SUMMARY   PRIMARY CARDIOLOGIST:  Cristopher Estimable. Lattie Haw, MD, [facility]  PRIMARY CARE PROVIDER:  Margarita Rana, MD  DISCHARGE DIAGNOSIS:  Unstable angina.  SECONDARY DIAGNOSES: 1. Coronary disease status post prior anterior MI in 2000, with more     recent drug-eluting stent placement to the obtuse marginal 1 in     2008. 2. Ischemic cardiomyopathy. 3. Hypertension. 4. Atrial fibrillation on chronic Coumadin anticoagulation. 5. Possible left ventricular apical thrombus by cath, echo, and MRI     this admission. 6. Peripheral vascular disease with known 80% celiac artery stenosis,     50% right renal artery stenosis. 7. Remote tobacco abuse. 8. Hyperlipidemia. 9. History of testicular cancer status post left orchiectomy in 1990. 10.Nephrolithiasis. 11.Status post appendectomy.  ALLERGIES:  No known drug allergies.  PROCEDURES: 1. Cardiac catheterization performed on June 01, 2010, revealing a     70-80% diffuse stenosis in the small first diagonal which was     stable.  The right coronary artery was known to be totally occluded     with left-to-right and right-to-right collaterals.  The left main     LAD and left circumflex were normal.  EF was 45%, inferior     hypokinesis, and presumed mural apical thrombus by left     ventriculography.  Medical therapy was recommended. 2. A 2D echocardiogram on June 02, 2010, showing an EF of 45-50%     with posterior inferior moderate hypokinesis and question of     thrombus on the apical lateral wall.  Could not rule out     trabeculation.  Grade 2 diastolic dysfunction.  Trivial mitral  regurgitation.  Moderately dilated left atrium. 3. Cardiac MRI showing an EF of 40% with inferior akinesis.  Full-     thickness scar in the inferior wall suggesting prior MI with     minimal viability.  Concern for thrombus on the apical inferior     wall in the long axis 2-chamber view.  Short axis views, this     finding were shown to potentially be due to trabeculation.  HISTORY OF PRESENT ILLNESS:  A 60 year old male with the above complex problem list and known significant vascular disease.  He was seen in the office on May 28, 2010, with complaints of left-sided chest pain, radiating to the right or to bilateral arms similar to previous angina. Decision was made to admit him for further evaluation and catheterization.  HOSPITAL COURSE:  The patient was admitted to [facility] on May 28, 2010, where he subsequently ruled out for MI.  As the patient is on chronic Coumadin with history of atrial fibrillation, this was placed on hold in preparation for catheterization.  The patient had an uneventful weekend and  underwent diagnostic catheterization on June 01, 2010, revealing stable coronary anatomy as outlined above.  EF was 45% and by left ventriculography, there appeared to be mural apical thrombus.  It was felt that the patient would require Lovenox or heparin/Lovenox bridging prior to discharge. To follow up question of apical thrombus, echocardiogram was performed on June 02, 2010, and unfortunately, this did not make the diagnosis any clear.  So, a cardiac MRI was undertaken on June 03, 2010, again revealing concern for thrombus on the apical inferior wall, but it was felt that perhaps this represented trabeculation.  Regardless, the patient's Coumadin was resumed postcatheterization and he has been maintained on heparin with plan to bridge to therapeutic INR.  He has had no recurrent chest pain or dyspnea.  His INR this morning was 1.93 and prior to  discharge, he is being given 1 dose of Lovenox with plan for followup INR next week in our [facility].  He will not be discharged home on Lovenox.  In light of the patient's reduced LV function noted during this admission with an EF of 40% by cardiac MRI, we have switched his beta- blocker therapy to carvedilol.  He has been maintained on lisinopril. His baseline status has been stable.  DISCHARGE LABS:  Hemoglobin 17.0, hematocrit 49.0, WBC 7.9, platelets 135, INR 1.93.  Sodium 135, potassium 4.4, chloride 98, CO2 24, BUN 24, creatinine 1.45, glucose 93, calcium 9.6.  CK 40, MB 1.2, troponin I less than 0.01.  Total cholesterol 104, triglycerides 301, HDL 31, LDL 13.  MRSA screen was negative.  DISPOSITION:  The patient will be discharged home today in good condition.  FOLLOWUP APPOINTMENTS:  The patient will follow up INR in our [facility] on June 10, 2010, at 11 a.m.  Follow up with Jory Sims, nurse practitioner, on June 22, 2010. at 11:40 a.m.  Follow up with Dr. Edrick Oh as previously scheduled.  DISCHARGE MEDICATIONS: 1. Aspirin 81 mg daily. 2. Carvedilol 6.25 mg b.i.d. 3. Lisinopril 20 mg daily. 4. Nitroglycerin 0.4 mg sublingual p.r.n. chest pain. 5. Multivitamin 1 tablet daily. 6. Cholesterol study drug 3 tabs at bedtime. 7. Coumadin 5 mg 1-1/2 tablets Monday, Wednesday, Friday, 1 tablet all     other days.  OUTSTANDING LAB STUDIES:  None.  DURATION OF DISCHARGE ENCOUNTER:  60 minutes including physician time.     Murray Hodgkins, ANP   ______________________________ Loralie Champagne, MD    CB/MEDQ  D:  06/05/2010  T:  06/06/2010  Job:  [id number]  cc:   Margarita Rana, M.D.  Electronically Signed by Murray Hodgkins ANP on 06/15/2010 12:02:50 PM Electronically Signed by Loralie Champagne MD on 06/26/2010 10:33:56 AM

## 2010-07-02 ENCOUNTER — Encounter (INDEPENDENT_AMBULATORY_CARE_PROVIDER_SITE_OTHER): Payer: MEDICARE | Admitting: Adult Health

## 2010-07-02 ENCOUNTER — Encounter (INDEPENDENT_AMBULATORY_CARE_PROVIDER_SITE_OTHER): Payer: MEDICARE

## 2010-07-02 ENCOUNTER — Encounter: Payer: Self-pay | Admitting: Adult Health

## 2010-07-02 ENCOUNTER — Encounter: Payer: Self-pay | Admitting: Cardiology

## 2010-07-02 DIAGNOSIS — Z7901 Long term (current) use of anticoagulants: Secondary | ICD-10-CM

## 2010-07-02 DIAGNOSIS — I4891 Unspecified atrial fibrillation: Secondary | ICD-10-CM

## 2010-07-02 DIAGNOSIS — I2510 Atherosclerotic heart disease of native coronary artery without angina pectoris: Secondary | ICD-10-CM

## 2010-07-02 DIAGNOSIS — I10 Essential (primary) hypertension: Secondary | ICD-10-CM

## 2010-07-02 LAB — CONVERTED CEMR LAB: POC INR: 3.7

## 2010-07-03 ENCOUNTER — Encounter: Payer: Self-pay | Admitting: Cardiology

## 2010-07-03 ENCOUNTER — Encounter: Payer: Self-pay | Admitting: Adult Health

## 2010-07-09 NOTE — Miscellaneous (Signed)
Summary: Orders Update  Clinical Lists Changes  Orders: Added new Test order of Arterial Duplex Lower Extremity (Arterial Duplex Low) - Signed 

## 2010-07-09 NOTE — Assessment & Plan Note (Signed)
Summary: post hosp [facility] per Curt Bears / tg  Medications Added CARVEDILOL 6.25 MG TABS (CARVEDILOL) take 1 tab two times a day ASPIR-LOW 81 MG TBEC (ASPIRIN) take 1 tab daily      Allergies Added: NKDA  Visit Type:  Follow-up Referring Provider:  . Primary Provider:  Dr. Edrick Oh  CC:  post hospital.  History of Present Illness: Mr. Stormer is a 60 y/o CM with known history of CAD.s/p AMI 2000, [facility] with chronic total obstruction of the RCA with DES placed in marginal 1, with inferior hypokinesis with EF of 45%. 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  He was last seen in our office with recurring chest discomfort and transferred to [facility] hospital for cardiac catherization.  This was completed on 06/01/2010 and it was found that his stent was widely with a collateralized RCA which was old.  The cath looked identical to previous cath in 2008.  He is doing well today.  He complains of right leg pain on occasion and is known to have PAD.  It was suggesed by Dr. [name] that he have LE arterial evaluations as a outpatient if pain persisted.  Otherwise the chest discomfort has lessened and almost eliminated with exertion.  Current Medications (verified): 1)  Nitrostat 0.4 Mg Subl (Nitroglycerin) .[name].. 1 Tablet Under Tongue At Onset of Chest Pain; You May Repeat Every 5 Minutes For Up To 3 Doses. 2)  Daily Multi  Tabs (Multiple Vitamins-Minerals) .[name].. 1 Once Daily 3)  Lisinopril 20 Mg Tabs (Lisinopril) .... Take Two  Tablets  By Mouth Daily 4)  Research Study Drug (Improve It) 5)  Warfarin Sodium 5 Mg Tabs (Warfarin Sodium) .[name].. 1 Tab Once Daily 6)  Carvedilol 6.25 Mg Tabs (Carvedilol) .... Take 1 Tab Two Times A Day 7)  Aspir-Low 81 Mg Tbec (Aspirin) .... Take 1 Tab Daily  Allergies (verified): No Known Drug  Allergies  Comments:  Nurse/Medical Assistant: patient brought meds we reviewed he uses [facility] in [place]  Review of Systems       All other systems have been reviewed and are negative unless stated above.   Vital Signs:  Patient profile:   60 year old male Weight:      200 pounds BMI:     27.22 Pulse rate:   79 / minute BP sitting:   123 / 79  (left arm)  Vitals Entered By: Doretha Sou, CNA (July 02, 2010 2:17 PM)  Physical Exam  General:  Well developed, well nourished, in no acute distress. Lungs:  Clear bilaterally to auscultation and percussion. Heart:  Non-displaced PMI, chest non-tender; regular rate and rhythm, S1, S2 without murmurs, rubs or gallops. Carotid upstroke normal, no bruit. Normal abdominal aortic size, no bruits. Femorals normal pulses, no bruits. Pedals normal pulses. No edema, no varicosities. Abdomen:  Bowel sounds positive; abdomen soft and non-tender without masses, organomegaly, or hernias noted. No hepatosplenomegaly. Msk:  Back normal, normal gait. Muscle strength and tone normal. Pulses:  pulses normal in all 4 extremities Extremities:  No clubbing or cyanosis. right wrist site is well healed from catheter insertion. Neurologic:  Alert and oriented x 3. Psych:  Normal affect.   Impression & Recommendations:  Problem # 1:  ATHEROSCLEROTIC CARDIOVASCULAR DISEASE (ICD-429.2) Catherization is reassuring and chest discomfort is essentially dissapated.  This was not believed to  be cardiac in etiology.  He will continue coreg 6.25mg  two times a day and stop metoprolol as he was taking in the past. Coreg was Rx on discharge but he was not taking it until follow-up. He will continue research drugs and coumadin as directed.  Problem # 2:  ATRIAL FIBRILLATION (ICD-427.31) Assessment: Unchanged  The following medications were removed from the medication list:    Metoprolol Tartrate 25 Mg Tabs (Metoprolol tartrate) .[name]... 1 tab two times a day His updated  medication list for this problem includes:    Warfarin Sodium 5 Mg Tabs (Warfarin sodium) .[name]... 1 tab once daily    Carvedilol 6.25 Mg Tabs (Carvedilol) .[name]... Take 1 tab two times a day    Aspir-low 81 Mg Tbec (Aspirin) .[name]... Take 1 tab daily  Problem # 3:  PERIPHERAL VASCULAR DISEASE (ICD-443.9) Will plan ABI's with sequential compression in the [facility] office for further evaluation.  If necessary will refer to Dr. Burt Knack for further testing and treatment.  Other Orders: LE Arterial Doppler/ABI (LE arterial doppler)  Patient Instructions: 1)  Your physician recommends that you schedule a follow-up appointment in: after test 2)  Your physician has recommended you make the following change in your medication: stop taking Metoprolol  3)  Your physician has requested that you have an ankle brachial index (ABI). During this test an ultrasound and blood pressure cuff are used to evaluate the arteries that supply the arms and legs with blood. Allow thirty minutes for this exam. There are no restrictions or special instructions.

## 2010-07-09 NOTE — Medication Information (Signed)
Summary: protime/tg  Anticoagulant Therapy  Managed by: Edrick Oh, RN PCP: Dr. Deno Lunger MD: Domenic Polite MD, Mikeal Hawthorne Indication 1: Atrial Fibrillation (ICD-427.31) Lab Used: [facility] [place] Site: [place] INR POC 3.7  Dietary changes: no    Health status changes: no    Bleeding/hemorrhagic complications: no    Recent/future hospitalizations: no    Any changes in medication regimen? no    Recent/future dental: no  Any missed doses?: no       Is patient compliant with meds? yes       Allergies: No Known Drug Allergies  Anticoagulation Management History:      The patient is taking warfarin and comes in today for a routine follow up visit.  Negative risk factors for bleeding include an age less than 41 years old.  The bleeding index is 'low risk'.  Positive CHADS2 values include History of HTN.  Negative CHADS2 values include Age > 79 years old.  The start date was 10/28/2008.  Anticoagulation responsible provider: Domenic Polite MD, Mikeal Hawthorne.  INR POC: 3.7.  Cuvette Lot#: [id number].  Exp: 10/11.    Anticoagulation Management Assessment/Plan:      The patient's current anticoagulation dose is Warfarin sodium 5 mg tabs: 1 tab once daily.  The target INR is 2 - 3.  The next INR is due 07/20/2010.  Anticoagulation instructions were given to patient.  Results were reviewed/authorized by Edrick Oh, RN.  He was notified by Edrick Oh RN.         Prior Anticoagulation Instructions: INR 2.9 Continue coumadin 5mg  once daily except 7.5mg  on Mondays, Wednesdays and Fridays  Current Anticoagulation Instructions: INR 3.7 Hold coumadin tonight then decease dose to 5mg  once daily except 7.5mg  on Sundays and Wednesdays

## 2010-07-15 ENCOUNTER — Encounter (INDEPENDENT_AMBULATORY_CARE_PROVIDER_SITE_OTHER): Payer: Medicare Other

## 2010-07-15 ENCOUNTER — Encounter: Payer: Self-pay | Admitting: Cardiology

## 2010-07-15 DIAGNOSIS — I739 Peripheral vascular disease, unspecified: Secondary | ICD-10-CM

## 2010-07-20 ENCOUNTER — Encounter (INDEPENDENT_AMBULATORY_CARE_PROVIDER_SITE_OTHER): Payer: Medicare Other

## 2010-07-20 ENCOUNTER — Encounter: Payer: Self-pay | Admitting: Cardiology

## 2010-07-20 DIAGNOSIS — Z7901 Long term (current) use of anticoagulants: Secondary | ICD-10-CM

## 2010-07-20 DIAGNOSIS — I4891 Unspecified atrial fibrillation: Secondary | ICD-10-CM

## 2010-07-20 LAB — CONVERTED CEMR LAB: POC INR: 1.9

## 2010-07-22 LAB — POCT I-STAT, CHEM 8
BUN: 14 mg/dL (ref 6–23)
Calcium, Ion: 1.08 mmol/L — ABNORMAL LOW (ref 1.12–1.32)
Chloride: 104 mEq/L (ref 96–112)
Creatinine, Ser: 0.9 mg/dL (ref 0.4–1.5)
Glucose, Bld: 99 mg/dL (ref 70–99)
HCT: 47 % (ref 39.0–52.0)
Hemoglobin: 16 g/dL (ref 13.0–17.0)
Potassium: 4.1 mEq/L (ref 3.5–5.1)
Sodium: 141 mEq/L (ref 135–145)
TCO2: 30 mmol/L (ref 0–100)

## 2010-07-22 LAB — GLUCOSE, CAPILLARY
Glucose-Capillary: 87 mg/dL (ref 70–99)
Glucose-Capillary: 99 mg/dL (ref 70–99)

## 2010-07-22 LAB — PROTIME-INR
INR: 2.79 — ABNORMAL HIGH (ref 0.00–1.49)
Prothrombin Time: 29.2 seconds — ABNORMAL HIGH (ref 11.6–15.2)

## 2010-07-30 ENCOUNTER — Ambulatory Visit: Payer: MEDICARE | Admitting: Cardiology

## 2010-07-30 NOTE — Medication Information (Signed)
Summary: ccr-lr  Anticoagulant Therapy  Managed by: Edrick Oh, RN PCP: Dr. Deno Lunger MD: Lattie Haw MD, Herbie Baltimore Indication 1: Atrial Fibrillation (ICD-427.31) Lab Used: [facility] [place] Site: [address] INR POC 1.9  Dietary changes: no    Health status changes: no    Bleeding/hemorrhagic complications: no    Recent/future hospitalizations: no    Any changes in medication regimen? no    Recent/future dental: no  Any missed doses?: no       Is patient compliant with meds? yes       Allergies: No Known Drug Allergies  Anticoagulation Management History:      The patient is taking warfarin and comes in today for a routine follow up visit.  Negative risk factors for bleeding include an age less than 47 years old.  The bleeding index is 'low risk'.  Positive CHADS2 values include History of HTN.  Negative CHADS2 values include Age > 37 years old.  The start date was 10/28/2008.  Anticoagulation responsible provider: Lattie Haw MD, Herbie Baltimore.  INR POC: 1.9.  Cuvette Lot#: [id number].  Exp: 10/11.    Anticoagulation Management Assessment/Plan:      The patient's current anticoagulation dose is Warfarin sodium 5 mg tabs: 5mg  once daily except 7.5mg  on Sundays and Wednesdays or as directed by anticoagulation clinic.  The target INR is 2 - 3.  The next INR is due 08/17/2010.  Anticoagulation instructions were given to patient.  Results were reviewed/authorized by Edrick Oh, RN.  He was notified by Edrick Oh RN.         Prior Anticoagulation Instructions: INR 3.7 Hold coumadin tonight then decease dose to 5mg  once daily except 7.5mg  on Sundays and Wednesdays  Current Anticoagulation Instructions: INR 1.9 Increase coumadin to 5mg  once daily except 7.5mg  on Mondays, Wednesdays and Fridays

## 2010-08-10 ENCOUNTER — Other Ambulatory Visit: Payer: Self-pay | Admitting: Cardiology

## 2010-08-13 ENCOUNTER — Encounter: Payer: Self-pay | Admitting: Cardiology

## 2010-08-13 ENCOUNTER — Other Ambulatory Visit: Payer: Self-pay

## 2010-08-13 DIAGNOSIS — I4891 Unspecified atrial fibrillation: Secondary | ICD-10-CM

## 2010-08-13 MED ORDER — WARFARIN SODIUM 5 MG PO TABS: 5.0000 mg | ORAL_TABLET | ORAL | Status: DC

## 2010-08-17 ENCOUNTER — Encounter: Payer: Medicare Other | Admitting: *Deleted

## 2010-08-20 ENCOUNTER — Ambulatory Visit (INDEPENDENT_AMBULATORY_CARE_PROVIDER_SITE_OTHER): Payer: Medicare Other | Admitting: Cardiology

## 2010-08-20 ENCOUNTER — Ambulatory Visit (INDEPENDENT_AMBULATORY_CARE_PROVIDER_SITE_OTHER): Payer: Medicare Other | Admitting: *Deleted

## 2010-08-20 ENCOUNTER — Encounter: Payer: Self-pay | Admitting: Cardiology

## 2010-08-20 DIAGNOSIS — I10 Essential (primary) hypertension: Secondary | ICD-10-CM

## 2010-08-20 DIAGNOSIS — E785 Hyperlipidemia, unspecified: Secondary | ICD-10-CM

## 2010-08-20 DIAGNOSIS — F17200 Nicotine dependence, unspecified, uncomplicated: Secondary | ICD-10-CM

## 2010-08-20 DIAGNOSIS — Z87891 Personal history of nicotine dependence: Secondary | ICD-10-CM

## 2010-08-20 DIAGNOSIS — I739 Peripheral vascular disease, unspecified: Secondary | ICD-10-CM

## 2010-08-20 DIAGNOSIS — I2510 Atherosclerotic heart disease of native coronary artery without angina pectoris: Secondary | ICD-10-CM

## 2010-08-20 DIAGNOSIS — F17201 Nicotine dependence, unspecified, in remission: Secondary | ICD-10-CM

## 2010-08-20 DIAGNOSIS — I4891 Unspecified atrial fibrillation: Secondary | ICD-10-CM

## 2010-08-20 DIAGNOSIS — Z720 Tobacco use: Secondary | ICD-10-CM | POA: Insufficient documentation

## 2010-08-20 LAB — POCT INR: INR: 3.1

## 2010-08-20 MED ORDER — CARVEDILOL 12.5 MG PO TABS: 12.5000 mg | ORAL_TABLET | Freq: Two times a day (BID) | ORAL | Status: DC

## 2010-08-20 NOTE — Assessment & Plan Note (Signed)
Lipid profile is excellent with current therapy.  Patient is receiving treatment with a statin, but since he is participating in a double-blind protocol, the identity and dose of this medication is unknown to [place].

## 2010-08-20 NOTE — Assessment & Plan Note (Signed)
Control of hypertension is good but not excellent.  Carvedilol will be increased to 12.5 mg b.i.d.  I do not think this will result in excessive slowing of heart rate in atrial fibrillation.

## 2010-08-20 NOTE — Patient Instructions (Signed)
Your physician recommends that you schedule a follow-up appointment in:1 YEAR Your physician has recommended you make the following change in your medication: INCREASE CARVEDILOL TO 12.5MG  TWICE DAILY

## 2010-08-20 NOTE — Assessment & Plan Note (Signed)
Recent catheterization showed no progression of CAD over the past 4 years.  He has done an excellent job with management of cardiovascular risk factors.

## 2010-08-20 NOTE — Assessment & Plan Note (Signed)
Heart rate control is good, and patient is asymptomatic.  To effect slightly better control of hypertension, carvedilol will be increased to 12.5 mg b.i.d.

## 2010-08-20 NOTE — Assessment & Plan Note (Signed)
ABIs showed no significant obstruction of flow to the lower extremities; Leg discomfort apparently did not represent claudication and has resolved without specific therapy.

## 2010-08-20 NOTE — Progress Notes (Signed)
HPI :  [name] [name] returns to the office as scheduled for continued assessment and treatment of atrial fibrillation, hypertension and diffuse vascular disease including coronary artery disease. He has done well since his last visit with no chest discomfort, no dyspnea despite moderate physical activity, no peripheral edema, no leg pain and no neurologic symptoms.  Current Outpatient Prescriptions on File Prior to Visit  Medication Sig Dispense Refill  . aspirin 81 MG tablet Take 81 mg by mouth daily.        [name] COUMADIN 5 MG tablet TAKE 1 TABLET ONCE DAILY.  60 each  3  . lisinopril (PRINIVIL,ZESTRIL) 20 MG tablet Take 20 mg by mouth daily.        . Multiple Vitamins-Minerals (MULTIVITAMIN WITH MINERALS) tablet Take 1 tablet by mouth daily.        . nitroGLYCERIN (NITROSTAT) 0.4 MG SL tablet Place 0.4 mg under the tongue every 5 (five) minutes as needed.        . warfarin (COUMADIN) 5 MG tablet Take 1 tablet (5 mg total) by mouth as directed. By coumadin clinic   60 tablet  3  . DISCONTD: carvedilol (COREG) 6.25 MG tablet Take 6.25 mg by mouth 2 (two) times daily with a meal.           No Known Allergies    Past medical history, social history, and family history reviewed and updated.  ROS: See history of present illness.  PHYSICAL EXAM: BP 137/88  Pulse 65  Ht 6' (1.829 m)  Wt 196 lb (88.905 kg)  BMI 26.58 kg/m2  SpO2 96%  General-Well developed; no acute distress Body habitus-proportionate weight and height Neck-No JVD, no carotid bruits Lungs: clear lung fields; normal I:E ratio Cardiovascular-normal PMI; normal S1 and S2; irregular rhythm Abdomen-normal bowel sounds; soft and non-tender without masses or organomegaly Skin-Warm, no significant lesions Extremities-1/2-1+ distal pulses;  Edema;  ASSESSMENT AND PLAN:

## 2010-08-29 ENCOUNTER — Other Ambulatory Visit: Payer: Self-pay | Admitting: Cardiology

## 2010-08-31 ENCOUNTER — Other Ambulatory Visit: Payer: Self-pay

## 2010-08-31 MED ORDER — WARFARIN SODIUM 5 MG PO TABS: 5.0000 mg | ORAL_TABLET | ORAL | Status: DC

## 2010-09-15 NOTE — Assessment & Plan Note (Signed)
[facility] OFFICE NOTE   [name] [name], [name] [name]                      MRN:          [id number]  DATE:12/15/2007                            DOB:          14-Jun-1950    CARDIOLOGIST:  He was previously followed by Dr. [name].  He will now be  followed by either Dr. Lattie Haw or Dr. Domenic Polite.   REASON FOR VISIT:  Add on for chest pain.   HISTORY OF PRESENT ILLNESS:  [name] is a 60 year old male patient  with a history of coronary artery disease, who had a myocardial  infarction in 2000 treated at  [facility] and subsequent stenting  to the circumflex in August 2008 with a Cypher drug-eluting stent.  His  last cardiac catheterization was done on December 28, 2006, secondary to  recurrent pain.  He had nonobstructive disease in the LAD, patent stent  in  the circumflex and a chronically occluded RCA.  The patient has  chronic chest pain.  He is enrolled in IMPROVE-IT study and recently saw  one of our research nurses in [place], who noted that his blood  pressure was elevated.  His medicines were adjusted and he was asked to  follow up today.  When his blood pressure was checked by the nurse he  described some chest discomfort and an electrocardiogram was performed  and I was asked to see the patient.   His ECG reveals atrial fibrillation.  In looking through his chart, he  did have atrial fibrillation documented when he saw research a few days  ago.  However, this seems to be new.  There is a remote history of  atrial fibrillation in the past.  His CHADS2 score is probably 1.  This  would include only hypertension.  He denies any history of stroke, heart  failure, or diabetes and he is under 13.   The patient's chest pain is chronic.  It is left-sided, sharp, and  quick.  It does radiate to his jaw at times.  He does take nitroglycerin  with relief at times.  He denies any associated shortness of  breath,  nausea, or diaphoresis.  He has been off of metoprolol for several  months now.   CURRENT MEDICATIONS:  Plavix 75 mg daily, Aspirin 81 mg daily, Research  study drug (IMPROVE-IT), Cozaar 100 mg daily, Multivitamin,  Nitroglycerin p.r.n.   PHYSICAL EXAMINATION:  GENERAL:  He is a well-nourished and well-  developed male in no acute distress.  VITAL SIGNS:  Blood pressure is 142/90 and pulse 79.  HEENT is normal.  NECK:  Without JVD.  CARDIAC:  Normal S1 and S2.  Irregularly irregular rhythm.  LUNGS:  Clear to auscultation bilaterally.  ABDOMEN:  Soft and nontender.  EXTREMITIES:  Without edema.  NEUROLOGIC:  He is alert and oriented x3.  Cranial II-XII are grossly  intact.   EKG, atrial fibrillation with a heart rate of 93, inferior Q-waves, and  no ischemic changes.   ASSESSMENT AND PLAN:  1. Chest pain.  The patient has history of coronary artery disease and  status post prior myocardial infarction and stenting.  He does have      a drug-eluting stent in the circumflex and remains on aspirin and      Plavix.  His last catheterization a year ago revealed stable      anatomy and a chronically occluded right coronary artery.  I      believe his chest pain is likely chronic stable angina.  We will      get him back on his metoprolol at 25 mg b.i.d.  No further ischemic      testing is warranted at this time.  2. Atrial fibrillation.  This seems to be of new onset.  His CHADS2      score seems to be 1.  He is already on aspirin and Plavix.  I have      discussed this with Dr. Lattie Haw and we will proceed with a      transesophageal echocardiogram to further assess.  If he has high-      risk features, he may need Coumadin therapy.  We will check his      BMET, CBC and TSH.  As noted, metoprolol will be added back to his      medical regimen.  3. Abdominal aortic aneurysm with evidence of penetrating ulcer by CT      scan in June 2009, followed by Dr. Oneida Alar.  The  patient was to have      followup CT scanning in April.  This was never accomplished.  I      will make sure that he has a CT angiogram arranged and follow up      with Dr. Oneida Alar.  4. Hypertension.  He has suboptimal control.  We will see what his      response is to metoprolol with a blood pressure check in 1 week's      time.   DISPOSITION:  The patient will be brought back in followup with Dr.  Lattie Haw in the next couple of weeks.  He prefers to remain in  [place].  We will try to get him set up with either Dr. Lattie Haw or  Dr. Domenic Polite.      Richardson Dopp, PA-C  Electronically Signed      Cristopher Estimable. Lattie Haw, MD, [facility]  Electronically Signed   SW/MedQ  DD: 12/15/2007  DT: 12/16/2007  Job #: [id number]

## 2010-09-15 NOTE — Cardiovascular Report (Signed)
NAME:  [name] [name], [name] [name]               ACCOUNT NO.:  [id number]   MEDICAL RECORD NO.:  [id number]          PATIENT TYPE:  OIB   LOCATION:  2899                         FACILITY:  [facility]   PHYSICIAN:  Juanda Bond. Burt Knack, MD  DATE OF BIRTH:  03/06/1951   DATE OF PROCEDURE:  DATE OF DISCHARGE:  12/28/2006                            CARDIAC CATHETERIZATION   DATE OF PROCEDURE:  December 28, 2006   PROCEDURE:  Selective coronary angiography.   INDICATIONS:  [name] is a 60 year old gentleman with recent  percutaneous coronary intervention on December 19, 2006.  He presented  with unstable angina and was treated with a 2.75 x 30 mm Cypher stent in  the left circumflex.  He has a chronically occluded right coronary  artery that is collateralized from the left.  He has developed recurrent  chest pain and was referred for re-look cardiac catheterization.   Risks and indications of procedure were explained to the patient.  Informed consent was obtained.  The right groin was prepped, draped and  anesthetized with 1% lidocaine using a modified Seldinger technique.  A  6-French sheath was placed in the right femoral artery.  Multiple views  of the left coronary artery were taken following selective coronary  angiography.  The patient was transferred to the recovery area in stable  condition.  The right coronary artery was not selected due to recent  study and known total occlusion of that vessel.   FINDINGS:  Left mainstem:  Has no significant angiographic disease and  it bifurcates into the LAD and left circumflex.   Left anterior descending coronary artery:  Is a large-caliber vessel  that courses down and reaches the LV apex.  There are there are diffuse  luminal irregularities throughout the proximal aspect.  There are two  small diagonal branches proximally, both of which have 50-70% ostial  stenoses. The third diagonal branch is a large vessel and has no  significant angiographic disease.   There is no significant distal  disease in the LAD.   Left circumflex:  Is a large-caliber vessel.  There is a large first OM  branch that has a long stent.  The stent is widely patent.  There is a  very minor area in the distal aspect of the stent with no more than 10-  20% stenosis that appears unchanged from the initial post PCI images.  The marginal branch extends down and divides into two branches that are  widely patent.  The AV groove circumflex is widely patent with no  significant angiographic stenosis.   Distal right coronary artery and its branches are collateralized from  the left.  The right coronary artery was not selectively injected.   ASSESSMENT:  1. Widely patent stent in the left circumflex.  2. Chronic occlusion of the right coronary artery.  3. Nonobstructive LAD stenosis.   [name] should continue his current medical therapy with no changes.      Juanda Bond. Burt Knack, MD  Electronically Signed     MDC/MEDQ  D:  12/28/2006  T:  12/29/2006  Job:  [id number]   cc:  Wallis Bamberg. [name], MD, [facility]

## 2010-09-15 NOTE — Assessment & Plan Note (Signed)
OFFICE VISIT   [name], [name] E  DOB:  June 29, 1950                                       06/07/2007  CHART#:[id number]   HISTORY:  The patient returns today for followup after his PET scan.  This showed no increased areas of hypermetabolic uptake in the area of  the mass adjacent to his aneurysm.  In light of this I think the  potential for this being recurrent malignancy is quite low.  I believe  the best option again is continued close observation.  We will have a CT  angiogram of the abdomen and pelvis rescheduled for him in 3 months'  time.  He will follow up with me again at that time as well.  If this  continues to grow over time we may need to consider repair at some point  in the future.   Jessy Oto. Fields, MD  Electronically Signed   CEF/MEDQ  D:  06/07/2007  T:  06/09/2007  Job:  [id number]   cc:   Wallis Bamberg. [name], MD, [facility]

## 2010-09-15 NOTE — Assessment & Plan Note (Signed)
OFFICE VISIT   Moxey, Aaryn E  DOB:  1950-12-11                                       05/31/2007  CHART#:[id number]   The patient is a 61 year old male referred by Dr. [name] for evaluation  of an aortic penetrating ulcer.  The patient is a 60 year old male who  previously, 1 year ago was noted on a CT scan to have a small out-  pouching adjacent to his abdominal aorta, which was 1.5x1.7 cm.  He most  recently had a CT angiogram of the abdomen and pelvis on January 21.  This shows that area has now increased in size to 2.1x2.2x2.7 cm.  The  patient denies any symptoms of night sweats, fever, or chills.  He  denies any trauma to the abdomen.  He denies any abdominal or back pain.  He did have a fairly severe pneumonia in 2006 after having rupture of  his appendix.  He has also had previous cardiac catheterization at  [facility] in 2002.  He has also had 2 previous cardiac  catheterizations in September of this year.  He has a history of  coronary artery disease, hypertension, and elevated cholesterol.  He has  had coronary stenting.  He has no family history of abdominal aortic  aneurysm.   PAST MEDICAL HISTORY:  Significant for testicular cancer, for which he  had an orchiectomy and radiation therapy.  He has also had an  appendectomy.   His atherosclerotic risk factors include coronary artery disease,  hypertension, elevated cholesterol.   MEDICATIONS:  He is on aspirin 81 mg once a day, Plavix 75 mg once a  day, and blood pressure medicine he does not know the name of, and a  Statin.  He has no known drug allergies.   SOCIAL HISTORY:  He is married.  He is disabled secondary to heart  dysfunction.  He has 2 children.  He is a nonsmoker for the last 6  weeks.  He does not drink alcohol.   REVIEW OF SYSTEMS:  He is 6 feet, 200 pounds.  He becomes short of breath on climbing 1 flight of stairs.  He  occasionally gets some chest pain with  exertion or with rest.  He stated  that he discussed with Dr. [name] that he would take nitroglycerin  intermittently for this.  He denies history of COPD, asthma, or wheezing.  GI, GU, neuro, orthopedic, hematologic, psychiatric, ENT review of  systems are negative.  From a vascular standpoint, he denies history of stroke or TIA.  He does  have some pain in his legs with walking approximately 100 yards.  This  has been present for approximately 2 years.  This sounds similar to  claudication-type symptoms.   PHYSICAL EXAM:  Blood pressure 142/72 in the left arm, heart rate is 82  and regular.  HEENT is unremarkable.  He has 2+ carotid pulses without  bruit.  Chest was clear to auscultation.  Cardiac exam is regular rate  and rhythm without murmur.  Abdomen is soft, nontender, nondistended  with no pulsatile mass.  He has radiation port sites in the lower pelvis  on the left and right.  He has 2+ radial pulses bilaterally.  He has a  2+ right femoral and a 1+ left femoral pulse.  He has absent popliteal  and pedal pulses bilaterally.  Both feet are adequately perfused and  pink.  He has no edema in the extremities.   I reviewed his CT scan of the abdomen and pelvis performed May 24, 2007.  There is a 2.2x2.1x2.7 cm mass adjacent to the left renal artery  and aorta.  There is no filling with contrast.  It was thought that this  possibly could be a pseudo-aneurysm.  There is no evidence of leak or  rupture.  The area had enlarged since 2007.  There was also a 60-70%  proximal right renal artery stenosis as well as a high-grade proximal  celiac artery stenosis.  The SMA and IMA were widely patent.  There is a  40-50% stenosis in the left common iliac artery.   His retroperitoneal abnormality adjacent to his left renal artery and  aorta could represent a pseudo-aneurysm.  The etiology is unknown, but  could possibly be infectious or previously traumatic.  However, this  could also  represent a tumor or enlarged lymph node.  Especially in  light of the fact that he had previous testicular cancer and radiation.  I believe the best test to further examine this would be a PET scan of  the abdomen.  We have scheduled this for him next week.  If the PET scan  shows no metabolic activity, then I would agree with Dr. Kathlene Cote that  this most likely represents a small pseudo-aneurysm.  Although contrast  was not seen to fill the pseudo-aneurysm on the CT scan, the fact that  it has grown in size, suggests that it may be pressurized if it is a  pseudo-aneurysm.  If the pets is negative, we will reorder a CT angio of  the abdomen and pelvis for him in 3 months' time to see if this area is  continuing to grow.  If there is continued growth, he may be at possible  risk of rupture.  We will need to consider whether repair at that time.  Repair would be fairly extensive involving both renal arteries.  I  discussed with the patient that, if he has abdominal or back pain, there  is some suggestion he may have an aortic aneurysm and he should tell the  emergency department that.  I will discuss with him the results of the  PET scan as soon as they are available.   Jessy Oto. Fields, MD  Electronically Signed   CEF/MEDQ  D:  05/31/2007  T:  06/01/2007  Job:  [id number]   cc:   Wallis Bamberg. [name], MD, [facility]  Margarita Rana, M.D.

## 2010-09-15 NOTE — Assessment & Plan Note (Signed)
[facility] OFFICE NOTE   KNOX, GANTNER                      MRN:          [id number]  DATE:12/27/2006                            DOB:          04-22-1951    [name] returns today for followup.  He has known coronary artery  disease.  He was just catheterized by myself on December 19, 2006.  He had  a long lesion in an obtuse marginal branch with an occluded right that  had some collaterals.  He had a long 30 mm stent placed by Dr. Olevia Perches.  He had been doing well up until yesterday.  He went back to work.  He  previously had driven a truck.  However, they had him doing other  activities.  There were some paint fumes that gave him a headache.  He  subsequently had chest pain and took 2 nitroglycerin.  This relieved the  pain.  The patient has not had any recurrence since yesterday.  However,  I had a long discussion with Jori.  I do not think it is possible for  me to guarantee that his stent is patent, particularly since it appears  to have been somewhat of a resistant lesion over a long length.  The  patient has been ambulatory this morning.  However, I told him that I  did not think I could get him back to work safely without documenting  that his stent is patent.   We called the [facility] lab, but apparently, there is no availability.  I told  him that, in this case, we would do it in the inpatient lab.  Apparently, Dr. Olevia Perches is there in the morning and he intervened on the  patient, and it would be nice to set him up with Dr. Olevia Perches again.   Otherwise, his review of systems is negative.   CURRENT MEDICATIONS:  1. Plavix 75 a day.  2. Toprol 50 a day.  3. Lipitor 80 a day.   EXAM:  Remarkable for a healthy-appearing middle-aged male in no  distress.  Affect is appropriate.  Height is 6 feet, weight is 188, blood pressure is 140/80, pulse 76 and  regular.  Afebrile.  Respiratory rate is 14.  HEENT:  Normal.  Carotids normal without bruit.  There is no lymphadenopathy.  No  thyromegaly.  No JVP elevation.  LUNGS:  Clear.  Good diaphragmatic motion.  No wheezing.  There is an S1, S2.  Normal heart sounds.  PMI is normal.  ABDOMEN:  Benign.  Bowel sounds positive.  No hepatosplenomegaly.  No  hepatojugular reflux.  No tenderness.  No AAA.  No bruits.  Femorals are +3 bilaterally.  Right femoral cath site is well-healed.  There is a slight residual ecchymosis but no hematoma.  PT +3.  There is  no lower extremity edema.  NEURO:  Nonfocal.  There is no muscular weakness.  SKIN:  Warm and dry.   EKG shows sinus rhythm with an old inferior wall infarct from his  occluded right.  There are Q waves in III and F.  There are no  acute  changes.   IMPRESSION:  1. Recurrent chest pain.  The patient has an occluded right coronary      artery with some collaterals.  His lesion is in the circumflex.      Stress testing would really not help differentiate these.  It is      impossible to let him go back to work in the face of recurrent      chest pain.  Diagnostic catheterization will be done in the      inpatient lab tomorrow by Dr. Olevia Perches.  He will continue his aspirin      and Plavix.  He knows to go to the emergency room for any prolonged      episodes of pain.  2. Hyperlipidemia.  The patient is on Lipitor.  He was part of the      IMPROVE-IT trial.  I have talked to [name] [name], our Nutritional therapist.      She will meet the patient in the cath lab tomorrow to give him his      research medication.  He will then followup as per that protocol.  3. Hypertension.  Currently well-controlled.  Continue beta blocker.      The patient may need his medications adjusted depending on how his      stent looks.  4. Remote history of atrial fibrillation.  No recurrence.  No      indication for Coumadin.  Continue aspirin and Plavix.  5. Previous smoking.  He quit on December 16, 2006.  He says he has  not      returned to this.  We will have to watch him closely.  I told him      to let us know if he feels like going back to it, and we could      prescribe him Chantix.   Further recommendations will be based on the results of his  catheterization, but his stent will be open and we can let him return to  work.     [name] [name]. [name], MD, [facility]  Electronically Signed    PCN/MedQ  DD: 12/27/2006  DT: 12/27/2006  Job #: [id number]

## 2010-09-15 NOTE — Consult Note (Signed)
NAME:  [name] [name], [name] [name]               ACCOUNT NO.:  [id number]   MEDICAL RECORD NO.:  [id number]          PATIENT TYPE:  INP   LOCATION:  3731                         FACILITY:  [facility]   PHYSICIAN:  Shaune Pascal. Bensimhon, MDDATE OF BIRTH:  1950-09-14   DATE OF CONSULTATION:  12/17/2006  DATE OF DISCHARGE:                                 CONSULTATION   History of Present Illness:   We were asked by the hospitalist team to consult on this 60 year old  Caucasian gentleman with known history of coronary artery disease who  presents to [facility] emergency room on day of admission complaining of  chest discomfort.  The patient is a very poor historian with regards to  his health history. He states he was seen by Dr. Phill Myron at [facility] approximately 10 years ago status post MI.  His primary care  physician is Dr. [name].  [name] states he has had 2-3 days  of nausea, vomiting, body aches and increased fatigue apparently since  last Wednesday, but it seemed like it started to resolve.  Yesterday,  being Friday, he was doing some work at home, trying to add an addition  to his house. He realized he needed some supplies, so he  drove to  [employer] to get some of his supplies. He had not done anything  exertional.  He was walking through the store and had sudden onset of  dizziness and experienced some pressure and tightness above his left  breast.  He states he became very diaphoretic.  He left the store, went  out and sat in his truck.  The symptoms continued.  He drove himself to  [facility] emergency room to be evaluated. On a scale of 1-10, his chest  pain was an 8.  The discomfort lasted around 15-20 minutes and had  decreased from an 8 to a 4 by the time he got to [facility] emergency  room. In the ER, he received aspirin, oxygen, clonidine, Lopressor and  nitroglycerin with complete resolution of his pain.  He was admitted by  [facility] team for observation.  Initial  set of cardiac markers were  negative.  He had no further chest discomfort through the night and  currently is pain free.  He has had no further episodes of nausea or  vomiting or body aches. As stated above, he previously was followed by  [facility]. Status post MI. He is not sure if it was  in 1998 or 2002, but apparently had a cardiac catheterization at that  time. States he also had a followup stress test and echocardiogram but  does not recall any of the information and has not followed up since  then.  Apparently also has a history of paroxysmal atrial fibrillation  status post acute appendectomy in 2004 at [facility].  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  (  He is a Dietitian,)  He states the doctor told him he had did not need to be on  Coumadin anymore, and he topped it and started taking aspirin.  Apparently has not taken any prescription medications since that time  but has a past medical history of coronary artery disease status post MI  approximately 8-10 years ago, history of testicular cancer status post  surgical repair and external beam radiation in 1980s, ongoing tobacco  use, status post ruptured appendix and appendectomy, postop atrial  fibrillation with rapid ventricular response after his appendectomy,  hypertension, hyperlipidemia, COPD, previous anticoagulation therapy,  previous kidney stones.   ALLERGIES:  No known drug allergies.   MEDICATIONS:  He states he takes an aspirin home, and that is it.  Here  he is on nicotine patch, aspirin, Lovenox, Protonix, Lopressor, and  clonidine.   SOCIAL HISTORY:  He lives in [place] with his wife.  He has adult  children.  He smokes one-half pack of cigarettes a day.  Denies any  drugs or herbal medications or alcohol use.   He is a Designer, industrial/product.   FAMILY HISTORY:  Mother deceased in her 26s, history of questionable  atrial fibrillation, apparently died from MI.  Father deceased in his  5s with a history of COPD and colon cancer.   REVIEW OF SYSTEMS:  Positive for chest pain and including tightness up  into his neck, positive nonproductive cough, positive dizziness,  headache, nausea, vomiting, fatigue and body aches for 3 days.   PHYSICAL EXAMINATION:  VITAL SIGNS:  Temperature 98.1,  pulse 63,  respirations 15, blood pressure 122/69, saturation 98% on room air.  GENERAL:  He is in no acute distress, mildly groggy this morning. He  states it was a long night.  HEENT:  Unremarkable.  LUNGS:  Scattered rhonchi throughout.  CARDIOVASCULAR:  Exam reveals S1-S2, regular rate and rhythm.  ABDOMEN:  Soft, nontender, positive bowel sounds.  EXTREMITIES:  Lower Extremities without clubbing, cyanosis or edema.  NEUROLOGIC:  The patient is a oriented x3.  Moves all extremities x4.   Chest x-ray shows no acute findings.   EKG normal sinus rhythm without acute changes.   Lab work:  TSH 0.6500.  Troponin x2 sets 0.01.  Hemoglobin 13.1,  hematocrit 38.5, WBC 7.1, platelets 202,000.  Sodium 140, potassium 4.0,  chloride 107, CO2 25, BUN 7, creatinine 0.7, glucose 92. Total  cholesterol 134, triglycerides 127, LDL 87, HDL 22.   IMPRESSION:  Chest pain concerning for unstable angina.  Negative  cardiac enzymes x2 sets. EKG without acute changes. In the setting of  hypertension, ongoing tobacco use, hypercholesteremia and a history of  known coronary artery disease and paroxysmal atrial fibrillation.  Dr.  Pierre Bali has been in to examine and assess patient. The patient  will need cardiac catheterization for reevaluation.  Will try to obtain  results of catheterization from [facility]. In the  meantime, check a urine drug screen, smoking cessation consult.  Would  recommend  discontinuing clonidine.  Continue Lovenox, Protonix, aspirin,  Lopressor. Nitroglycerin drip if needed chest discomfort returns. We  will continue to follow the patient.      Rosanne Sack, ACNP      Shaune Pascal. Bensimhon, MD  Electronically Signed    MB/MEDQ  D:  12/17/2006  T:  12/18/2006  Job:  [id number]

## 2010-09-15 NOTE — Assessment & Plan Note (Signed)
[place] CARDIOLOGY OFFICE NOTE   ARLESTER, ARREDONDO                      MRN:          [id number]  DATE:02/07/2007                            DOB:          Nov 09, 1950    [name] [name] returns today for followup.  He is a vasculopath.  He has had a  recent stent to the circumflex coronary artery in August by Dr. Burt Knack.  He has a totaled RCA with collaterals and moderate disease in his LAD.   He was last seen by Richardson Dopp, PA-C, and Norvasc was added for his  blood pressure.   In talking to the patient, he continues to have some difficulties.  He  continues to have pain in his chest.  It sounds atypical.  He was re-  cathed and had a widely patent stent.  I think since some of his pain is  musculoskeletal in nature, he describes some right shoulder and right  neck pain, which certainly sounds cervical.   He also has been having significant claudication, particularly in his  right leg.  We did ABI on him, and they were read at [facility] as  suggestive of significant stenosis involving inflow to the right lower  extremity.   There was also the question of a 32 mm pressure gradient from the right  brachial to the right thigh.   Overall, however, his ABI appeared to be greater than 0.96, but I had a  hard time deciphering the report.  Otherwise, the patient has been  trying to cut back on his cigarettes.  He was given Chantix and has been  taking it.  He used to smoke a pack a day and is down to 2 to 3  cigarettes a day.  I congratulated him on this.   He has been compliant with his other medications.  The patient works at  [employer].  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  There is a bit of physical labor involved.  I told him we should  probably keep him out of work until we sort through his continued  vascular problems.   CURRENT MEDICATIONS:  1. Norvasc 5 mg a day, which will be  stopped.  2. Plavix 75 a day.  3. Metoprolol 50 a day.  4. An aspirin a day.  5. Research study drug on the IMPROVE-IT trial.  6. Cozaar 50 mg a day to be added.   REVIEW OF SYSTEMS:  Otherwise, negative.   EXAM:  Remarkable for a chronically ill-appearing middle-aged white male  in no distress.  Weight is 196, blood pressure 170/80 and equal in each arm.  There is no  differential.  Pulse is 60 and regular.  Respiratory rate is 12.  He is  afebrile.  HEENT:  Remarkable for a right subclavian bruit.  There is no parvus and  no tardus.  No JVP elevation, lymphadenopathy.  No thyromegaly.  LUNGS:  Clear.  Good diaphragmatic motion with no wheezing.  There is an S1, S2 with a soft systolic murmur.  PMI is normal.  ABDOMEN:  Benign.  Bowel sounds positive.  No tenderness.  No AAA.  No  renal bruit.  No hepatosplenomegaly or hepatojugular reflux.  He has  bilateral femoral bruits.  Popliteals are +3 bilaterally.  DP +1 bilaterally.  There is no lower  extremity edema.  NEURO:  Nonfocal.  There is no muscular weakness.  SKIN:  Warm and dry.   His electrocardiogram shows hyperacute T waves in the anterolateral  leads, which are chronic, and a previous inferior wall MI.   IMPRESSION:  1. Coronary disease, recent stent to the circumflex with      collateralized right.  Continue aspirin and beta blocker therapy.      I do not think his claudication is bad enough to consider switching      to Visken.  However, his coronary disease is bad enough that I      would not want to place him on Pletal.  2. Hypertension.  Stop amlodipine, start Cozaar 50 mg a day.  We will      have to check a BMET to make sure his creatinine does not rise      given his other vascular disease.  Follow up in 8 to 10 weeks.      Continue low-salt diet.  3. Peripheral vascular disease.  I think it is reasonable to check a      CTA.  In light of possible in-flow disease and symptoms right      greater than left.  I  will have him follow up with Dr. Burt Knack in      [place] after this.  4. Concern given his right-sided shoulder and neck pain.  I doubt this      is related to vascular disease, but he does have a bruit in the      right subclavian area and is a vasculopath.  He will have a carotid      duplex with interrogation of the right subclavian for steal as well      prior to seeing Dr. Burt Knack.  His blood pressures are equal in each      arm and I doubt that he has any high grade lesion, but this      certainly needs to be checked at a baseline.   We will keep the patient out of work until he has further vascular  workup.  Again, I encouraged him to stop smoking completely, otherwise,  he is likely going to have coronary artery bypass surgery by the time he  is 85.     Wallis Bamberg. [name], MD, [facility]  Electronically Signed    PCN/MedQ  DD: 02/07/2007  DT: 02/07/2007  Job #: [id number]

## 2010-09-15 NOTE — Assessment & Plan Note (Signed)
OFFICE VISIT   [name], [name] E  DOB:  [birth year]                                       12/28/2007  CHART#:[id number]   The patient returns for followup today for previous history of what was  thought to be a possible penetrating aortic ulcer adjacent to his left  renal artery.  He was last seen in February of 2009.  Since that time he  denies any abdominal or back pain.  He has had no significant changes to  his medical history.   PHYSICAL EXAMINATION:  Today blood pressure is 155/100 in the left arm.  Pulse is 79 and regular.  Abdomen is soft, nontender, nondistended with  no pulsatile mass.  He has 2+ femoral pulses bilaterally.   He had a CT angiogram performed on August 18.  This showed that the  ulcer is just below the left renal artery and is stable in character.  It is 20 x 25 mm in diameter on measurement today.  There is no  surrounding inflammation.  He continues to not manifest any signs or  symptoms of infection.   I believe the best management for this is continued observation.  We  will obtain an additional CT scan in six months' time.   Jessy Oto. Fields, MD  Electronically Signed   CEF/MEDQ  D:  12/29/2007  T:  12/29/2007  Job:  [id number]   cc:   Wallis Bamberg. [name], MD, [facility]

## 2010-09-15 NOTE — Assessment & Plan Note (Signed)
[place] CARDIOLOGY OFFICE NOTE   [name] [name], [name] [name]                      MRN:          [id number]  DATE:04/20/2007                            DOB:          21-Apr-1951    [name] [name] returns today for followup.   He has had multiple issues to discuss.  He has known coronary artery  disease with an occluded right with collaterals.  He has had a stent to  the circle M branch in August, 2008.  He has been doing well.  He has  not had any significant chest pain.   He is a previous smoker.  He has been on Chantix and stopped for six  weeks.  I congratulated him on this.   He also has a history of PAF.  There have been no palpitations or  presyncope.  We have had him off Coumadin since he is on aspirin and  Plavix, and he has not had any recurrences.   He has some peripheral vascular disease with bilateral femoral bruits.   He had a CTA which did not show significant disease, and his ABIs were  not that bad.  He saw Dr. Burt [name], who did not feel angiography was in  order.  His CTA did show a penetrating ulcer of the infrarenal abdominal  aorta, which Dr. Burt [name] felt could be watched.  He will have a follow-up  CT scan at the end of January.  Again, all of the warning signs in  regards to increased abdominal pain were discussed with the patient.   His review of systems is remarkable for head cold.  He has had a low  grade fever.  He was encouraged to talk to Dr. Edrick [name] in case it recurs  and to get antibiotics, such as Augmentin.  His review of systems is  otherwise negative.   MEDICATIONS:  1. Aspirin daily.  2. Plavix 75 a day.  3. Metoprolol 50 a day.  4. A research study drug for Improve It.  5. Cozaar 50 a day.  6. Chantix.   He has had some vivid dreams on the Chantix.   PHYSICAL EXAMINATION:  Exam is remarkable for a chronically ill-  appearing middle-aged white male in no distress.  Weight is 202.   Pulse is 62.  Blood pressure is 140/70.  Respiratory  rate 14.  Afebrile.  HEENT:  Unremarkable.  No sinus tenderness.  He has a faint right subclavian bruit.  No JVP elevation, no  lymphadenopathy, thyromegaly, or lymph nodes.  LUNGS:  Clear without active wheezing.  Good diaphragmatic motion.  S1 and S2, normal heart sounds.  PMI normal.  ABDOMEN:  Benign.  Bowel sounds positive.  No AAA, no renal bruit, no  tenderness, no hepatosplenomegaly or hepatojugular reflux.  Bilateral femoral bruits; however, his PT's are +2 bilaterally with good  distal pulses.  No edema.  NEURO:  Nonfocal.  No muscular weakness.   IMPRESSION:  1. Coronary disease, occluded right with collateral stent to the      obtuse marginal.  Continue aspirin and Plavix.  Probably follow up  Myoview in a year.  2. Peripheral vascular disease, as seen by Dr. Burt [name].  Claudication      currently not that bad.  His smoking cessation will help.  He has      no nonhealing ulcers.  Continue conservative therapy.  3. Penetrating ulcer of the aorta.  Follow up CT scan at the end of      January.  He will call us if he has any abdominal pain.  4. Hypercholesterolemia in the setting of coronary disease.  Continue      Improve It research study drug.  Follow up lab work per research      protocol.  5. Hypertension:  Currently well controlled.  Continue Cozaar 50 a      day.  Low salt diet.  6. Smoking cessation.  Continue Chantix.  He has been six weeks now      without smoking.  He understands the importance of this in regards      to his vascular disease.   Overall, the patient is doing well.  I will see him back in January when  he has a CT scan to follow up his penetrating aortic ulcer.     Wallis Bamberg. [name], MD, [facility]  Electronically Signed    PCN/MedQ  DD: 04/20/2007  DT: 04/20/2007  Job #: [id number]

## 2010-09-15 NOTE — Progress Notes (Signed)
[place]                        PERIPHERAL VASCULAR OFFICE NOTE   QUATEZ, HILT                      MRN:          [id number]  DATE:02/24/2007                            DOB:          1950-09-16    REFERRING PHYSICIAN:  Wallis Bamberg. [name], MD, [facility]   REASON FOR CONSULTATION:  Bilateral leg-pain, consistent with  claudication.   HISTORY OF PRESENT ILLNESS:  Mr. Blunk is a 60 year old gentleman with  coronary artery disease.  He has been followed by Dr. [name].  He  underwent drug eluting stent placement in the left circumflex by Dr.  Olevia Perches earlier this year.  He has continued to have chest pain and on re-  look catheterization, his stent was widely patent.  He has been  complaining of leg pain and was referred for evaluation as his symptoms  have been consistent with claudication.  He had an AVI study done at  [facility] and there was a question of an inflow problem in the right  lower extremity.   From a symptomatic standpoint, Mr. Kusnierz complains of bilateral leg pain  with walking approximately 200 yards.  The right leg is worse than the  left.  He complains of cramping pain involving the right hip, thigh and  calf.  The pain is relieved with resting.  Sometimes he has pain with  sitting, but it has different characteristics.  He has noted his leg  pain for several months and it has not been progressive.  He denies  significant rest pain.  He has had no ulcerations or nonhealing wounds.   A CT angio of the abdomen and legs was performed for further evaluation.  The CAT scan demonstrated a penetrating ulcer of the infrarenal aorta.  There was moderate right renal artery narrowing and only mild narrowing  of the iliac arteries.  Essentially, the iliacs, femorals, superficial  femorals and infrageniculate vessels had no significant stenoses noted.   CURRENT MEDICATIONS INCLUDE:  1. Plavix 75 mg daily.  2. Metoprolol 50 mg daily.  3.  Amlodipine 5 mg daily.  4. Aspirin 81 mg daily.  5. Research study drug.  6. Cozaar 50 mg daily.  7. Chantix daily.   ALLERGIES:  NKDA.   PAST MEDICAL HISTORY:  Includes the following.  1. Coronary artery disease, status post PCI to the left circumflex      with drug eluting stent.  2. Hypertension.  3. Nephrolithiasis.  4. History of testicular cancer, status post left orchiectomy and      radiation therapy.  5. COPD.  6. Ongoing tobacco.  7. Status post appendectomy.   SOCIAL HISTORY:  The patient is married.  He has smoked cigarettes for  35 years.  He has no history of alcohol.   FAMILY HISTORY:  There is no coronary artery disease in the family.  His  parents both have hypertension and his father has had prostate cancer.   REVIEW OF SYSTEMS:  Pertinent for ongoing chest pain in an atypical  fashion.  All other systems were reviewed and are negative, except as  above.   EXAM:  The patient is alert and oriented.  He is in no acute distress.  Weight is 194, blood pressure 170/82, heart rate 79, respiratory rate  16.  HEENT:  Normal.  NECK:  Normal carotid upstrokes without bruits.  Jugular venous pressure  is normal.  No thyromegaly or thyroid nodules.  LUNGS:  Clear to auscultation bilaterally.  HEART:  Regular rate and rhythm without murmurs or gallops.  ABDOMEN:  Soft, nontender, no organomegaly, no abdominal bruits.  BACK:  There is no CVA tenderness.  EXTREMITIES:  There is no clubbing, cyanosis or edema.  Femoral pulses  are 2+ with bilateral bruits, right greater than left.  Popliteal pulses  are palpable bilaterally and 2+.  Posterior tibial pulses are 2+.  Dorsalis pedis pulses are 1+ and equal.   ASSESSMENT:  Mr. Lackland is a 60 year old gentleman with mild bilateral  peripheral arterial disease.  I think his leg pain, while having some  typical features of claudication, is probably unrelated to lower  extremity arterial occlusive disease.  His CTA  demonstrates only mild  disease with no significant stenoses in the major lower extremity  vessels.   Regarding the penetrating ulcer in the infrarenal aorta, I think we  should follow up with a CT angiogram in three months to make sure that  this is stable.  He is already on appropriate medical therapy with a  beta blocker on board.  His medications are being titrated for better  blood pressure control, but certainly his beta blocker would be an ideal  medication to increase as needed.  He has just recently undergone a  change in his antihypertensive, so I did not make further adjustments.  He is free of any abdominal pain and otherwise has no concerning  symptoms.  I obtained the CT scan result after Mr. Jungers left clinic, so  I will be in contact with them to give them the result and I will alert  him to be cautious of any symptoms of abdominal pain.  In any event, I  would like to follow up with a CAT scan in three months and, if stable,  will likely follow up one more time with a six-month CT scan.   Mr. Bein is on appropriate medical therapy under Dr. Kyla Balzarine care and  I made no further adjustments today.  I will see him back in clinic in  approximately six months and will contact him by phone after his CT scan  results are back in three months.     Juanda Bond. Burt Knack, MD  Electronically Signed    MDC/MedQ  DD: 02/23/2007  DT: 02/24/2007  Job #: [id number]

## 2010-09-15 NOTE — Cardiovascular Report (Signed)
NAME:  [name], [name]               ACCOUNT NO.:  [id number]   MEDICAL RECORD NO.:  [id number]          PATIENT TYPE:  INP   LOCATION:  6522                         FACILITY:  [facility]   PHYSICIAN:  Peter C. [name], MD, FACCDATE OF BIRTH:  1950-12-24   DATE OF PROCEDURE:  DATE OF DISCHARGE:                            CARDIAC CATHETERIZATION   CORONARY ARTERIOGRAPHY   INDICATIONS:  Unstable angina.   HISTORY:  Coronary disease, previous cath at [facility] 10 years  ago.  Apparently at that time, he had a 50% distal right lesion.   Cine catheterization was done from the right femoral artery, using 6-  French catheters.  We had little bit of difficulty passing a wire  through the right iliac area, but used wire exchanges without  difficulty.   Left main coronary artery was normal.   Left anterior descending artery had 20% discrete disease proximally.  Mid and distal vessel were normal.  There were small diffusely diseased  first diagonal branch, second diagonal branch with 20% multiple lesions,  and was a large branching vessel.   Circumflex coronary artery was a medium-sized vessel.  The first obtuse  marginal branch had 80% multiple discrete lesions, in it's mid body.  There was significant left-to-right collaterals.  The right coronary  artery was 100% occluded proximally.  There was a long segment of  occlusion with right-to-right and left-to-right collaterals to the  distal vessel.   RAO ventriculography.  RAO ventriculography showed inferolateral,  inferior posterior wall hypokinesis.  EF was in the 40% to 45% range.  There is no gradient across the aortic valve and no MR.  Aortic pressure  was 160/80, LV pressure was 160/16.   IMPRESSION:  The films were reviewed.  Dr. Olevia Perches will proceed with  angioplasty and stenting of the long segment of stenosis in the obtuse  marginal branch.  The patient will probably be able to be discharged  tomorrow.  While waiting for Dr.  Olevia Perches, the patient was loaded with  Plavix and Angiomax.      [name] [name]. [name], MD, [facility]  Electronically Signed    PCN/MEDQ  D:  12/19/2006  T:  12/19/2006  Job:  [id number]   cc:   Margarita Rana, M.D.  [facility] Cardiology Office - [place]

## 2010-09-15 NOTE — Assessment & Plan Note (Signed)
[place] OFFICE NOTE   [name] [name], [name] [name]                      MRN:          [id number]  DATE:01/11/2007                            DOB:          November 20, 1950    CARDIOLOGIST:  Wallis Bamberg. [name], M.D., [facility].   PRIMARY CARE PHYSICIAN:  None.   REASON FOR VISIT:  Post catheterization followup.   HISTORY OF PRESENT ILLNESS:  [name] [name] is a 60 year old male patient  with a history of coronary artery disease status post myocardial  infarction in 2000 treated a [facility] in [place] who was  seen in consultation by our service December 17, 2006 secondary to  unstable angina pectoris.  He subsequently underwent percutaneous  coronary intervention with placement of a CYPHER drug-eluting stent to  the circumflex by Dr. Olevia Perches on August 18.  He saw Dr. [name] back in  followup on August 26 and had noted recurrent chest pain.  He was set up  for cardiac catheterization.  This was done by Dr. Burt Knack on August 27.  This revealed luminal irregularities in the LAD, 50-70% ostial stenoses  in 2 small diagonal branches, widely patent stent in the left  circumflex, and a chronically occluded RCA with collaterals from the  left.  He was continued on medical therapy.   The patient returns to the office today for followup.  He notes he is  doing well.  He continues to have left-sided chest pain.  He describes  it as sharp.  It usually occurs with emotional stress.  He usually takes  nitroglycerin with prompt relief.  He often has left arm discomfort.  He  notes that these symptoms are similar to what he had with his previous  myocardial infarction and angina in the past.  He denies any associated  shortness of breath, nausea, or diaphoresis.  He denies any syncope.   He does note a symptom of bilateral lower extremity calf pain with  exertion.  This has been ongoing for quite some time.  He denies any non-  healing  ulcers or loss of hair growth.   CURRENT MEDICATIONS:  1. Plavix 75 mg daily.  2. Toprol XL 50 mg daily.  3. IMPROVE-IT study drug.  4. Aspirin 325 mg daily.  5. Nitroglycerin p.r.n. chest pain.   SOCIAL HISTORY:  He continues to smoke cigarettes.   PHYSICAL EXAM:  He is a well-nourished, well-developed male in no acute  distress.  Blood pressure 152/80, pulse 72, weight 190 pounds.  HEENT:  Normal.  NECK:  Without JVD.  CARDIAC:  Normal S1, S2.  Regular rate and rhythm without murmurs.  LUNGS:  Clear to auscultation bilaterally without wheeze, rales, or  rhonchi.  ABDOMEN:  Soft and nontender with normoactive bowel sounds.  No  organomegaly.  EXTREMITIES:  Without edema.  Calves are soft and nontender.  SKIN:  Warm and dry.  NEUROLOGIC:  He is alert and oriented x3.  Cranial nerves 2-12 are  intact.  Femoral arteries are 1 to 2+ bilaterally with positive bruit  bilaterally.  Right femoral arteriotomy site  without pain or hematoma.   IMPRESSION:  1. Coronary artery disease.      a.     History of remote myocardial infarction in 2000 treated at       [facility].      b.     Status post CYPHER drug-eluting stent placement to the       circumflex August of 2008.      c.     Chronically occluded right coronary artery with collaterals       from the left.      d.     Nonobstructive coronary artery disease as outlined above.      e.     Chronic stable angina pectoris.  2. Left ventricular dysfunction with an ejection fraction of 40-45% by      catheterization December 19, 2006.  3. Hypertension.  4. Treated dyslipidemia.  5. Peripheral arterial disease by exam.      a.     Bilateral lower extremity claudication.  6. Tobacco abuse.  7. Remote history of atrial fibrillation in the past.   PLAN:  The patient presents to the office today for followup.  He  continues to have symptoms of chest pain.  These are somewhat consistent  with his previous episodes of  angina.  He recently had cardiac  catheterization.  I suppose he could possibly have a degree of pain  coming from the 2 ostial branches that are small with disease of about  50-70% as well as the chronically occluded RCA.  He also notes symptoms  consistent with claudication and has evidence of PAD on exam.  He is  interested in medication to help him quit smoking.  At this point in  time, we plan to:  1. Initiate amlodipine 5 mg daily both for blood pressure and as an      antianginal.  2. Keep him out of work until he is seen back in followup with Dr.      [name] in 2 weeks to reassess the symptoms.  3. Arrange ABI and arterial Dopplers of the lower extremities to rule      out significant PAD.  4. Initiate Chantix therapy to help with quitting smoking.  5. Continue followup with the research staff for IMPROVE-IT.  6. As noted above, followup with Dr. [name] in 2 weeks to reassess his      symptoms or sooner p.r.n.      [name] Dopp, PA-C  Electronically Signed      Carlena Bjornstad, MD, [facility]  Electronically Signed   SW/MedQ  DD: 01/11/2007  DT: 01/12/2007  Job #: [id number]

## 2010-09-15 NOTE — Letter (Signed)
January 09, 2008    [name] [name], [place]  [name] [name], [place] [address]   RE:  [name] [name]  MRN:  [id number]  /  DOB:  [birth year]   Dear [name] [name],   [name] [name] returns to the office for continued assessment treatment of  coronary disease and cardiovascular risk factors.  He was previously a  patient of Dr. Kyla [name], but has not joined my practice.  He had a  questionable remote history of atrial fibrillation, but recently was  found to have recurrent arrhythmia.  A transesophageal echocardiogram  revealed mildly impaired left ventricular systolic function, as was  previously known, and mild risk factors for thromboembolism.  [name] [name]  remains active including doing substantial gardening without dyspnea or  chest discomfort.   CURRENT MEDICATIONS:  1. Clopidogrel 75 mg daily.  2. Metoprolol 25 mg b.i.d.  3. Aspirin 81 mg daily.  4. Research drug, which is a statin.  5. Cozaar 50 mg daily.   PHYSICAL EXAMINATION:  GENERAL:  Proportionate gentleman in no acute  distress.  VITAL SIGNS:  The weight is 201, no change since last month.  Blood  pressure 140/85, heart rate 76 and irregular, respirations 14.  NECK:  No jugular venous distention; normal carotid upstrokes without  bruits.  LUNGS:  Clear.  CARDIAC:  Irregular rhythm; normal first and second heart sounds.  ABDOMEN:  Soft and nontender; no organomegaly.  EXTREMITIES:  Distal pulses intact; no edema.   A recent CT scan was ordered by Dr. Oneida [name].  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   IMPRESSION:  [name] [name] coronary disease appears stable.  There has  been no change in left ventricular systolic function.  He has no  symptoms to suggest myocardial ischemia.  Hypertension is reasonably  well managed, but his medication is needlessly expensive.  We will stop  losartan and start lisinopril 40 mg daily.  Lipid management  has done  under the auspices of the improvement study.  He has low-to-moderate  thromboembolic risk due to atrial fibrillation, but would have  substantial risk of bleeding if he were treated with clopidogrel,  aspirin, and warfarin.  We will continue his current antithrombotic  regimen for one more year and then substitute warfarin for clopidogrel.  I will see this nice gentleman again in 6 months.    Sincerely,      [name] [name]. [name] Haw, MD, [facility]  Electronically Signed    RMR/MedQ  DD: 01/09/2008  DT: 01/10/2008  Job #: [id number]

## 2010-09-15 NOTE — Cardiovascular Report (Signed)
NAME:  [name] [name], [name] [name]               ACCOUNT NO.:  [id number]   MEDICAL RECORD NO.:  [id number]          PATIENT TYPE:  INP   LOCATION:  6522                         FACILITY:  [facility]   PHYSICIAN:  Bruce R. Olevia Perches, MD, FACCDATE OF BIRTH:  February 05, 1951   DATE OF PROCEDURE:  12/19/2006  DATE OF DISCHARGE:                            CARDIAC CATHETERIZATION   HISTORY OF PRESENT ILLNESS:  Mr. Vanessen is 60 years old and has  hypertension, COPD and cigarette use.  He has had a catheterization done  at [facility] about 10 years ago, at which  time he had nonobstructive disease.  He was admitted with chest pain,  felt to represent unstable angina and was studied by Dr. [name] ,. found  to have a chronic total occlusion of the right coronary artery and 95%  stenosis and large marginal branch of circumflex artery with inferior  basal wall akinesis.  We felt intervention on the circumflex artery was  the best therapeutic option.   PROCEDURE:  The procedure was performed via right femoral artery using  arterial sheath and a CLS4 6-French guiding catheter with side holes.  The patient was given Angiomax bolus infusion and was loaded with 600 mg  Plavix and had been given four chewable aspirin.  We passed a Forte  marker wire down the circumflex marginal vessel without difficulty.  We  predilated with 2.25 x 20-mm Maverick performing two inflations up to 10  atmospheres for 20 seconds.  We then deployed a 2.75 x 33 mm Cypher  stent deploying this with one inflation of 16 atmospheres for 20  seconds.  We then postdilated with a 3.25 x 20-mm Quantum Maverick  performing three inflations up to 21 atmospheres for 20 seconds each.  We still were left with a slight waste in the balloon, so we went back  with a 3.25 x 15 mm Cordis Dura Star high pressure balloon and performed  two inflations up to 22 atmospheres for 30 seconds.  There was still a  slight residual waste in the  balloon and stent.   FINAL DIAGNOSIS:  Infarct with guiding catheter.  The patient tolerated  lost laboratory satisfactory condition.   RESULTS:  Initial stenosis in the circumflex marginal vessel was at  estimated 95% with segmental disease over about 30 mm.  Following  stenting this improved to 10%.   CONCLUSION:  Successful PCI of the lesion and circumflex marginal vessel  using Cypher drug-eluting stent with improvement in segment of  narrowing from 95% to 10%.   DISPOSITION:  The patient return to post angio room for further  observation.  He should remain on Plavix for at least a year.      Bruce Alfonso Patten Olevia Perches, MD, [facility]  Electronically Signed     BRB/MEDQ  D:  12/19/2006  T:  12/19/2006  Job:  [id number]   cc:   Wallis Bamberg. [name], MD, [facility]  Margarita Rana, M.D.  Cardiopulmonary Laboratory  Shaune Pascal. Bensimhon, MD

## 2010-09-15 NOTE — Discharge Summary (Signed)
NAME:  [name] [name], [name] [name]               ACCOUNT NO.:  [id number]   MEDICAL RECORD NO.:  [id number]          PATIENT TYPE:  INP   LOCATION:  [id number]                         FACILITY:  [facility]   PHYSICIAN:  Cyril Mourning, D.O.    DATE OF BIRTH:  1951/01/29   DATE OF ADMISSION:  12/16/2006  DATE OF DISCHARGE:  12/20/2006                               DISCHARGE SUMMARY   PRIMARY CARE PHYSICIAN:  Unassigned.   CARDIOLOGIST:  Wallis Bamberg. [name], MD, [facility] .  He was instructed to follow  up 4 weeks following discharge by Dr. [name].   FINAL DIAGNOSES:  1. Chest pain with unstable angina and coronary artery disease status      post percutaneous coronary intervention  by Dr. Eustace Quail on      December 19, 2006. This was circumflex marginal vessel. A drug-      eluding stent was placed.  2. History of a paroxysmal atrial fibrillation managed by Dr. [name].  3. History of a testicular cancer status post x-ray therapy in the      1980s.  4. History of tobacco abuse.  5. Chronic obstructive pulmonary disease.  6. Hypertension.   DISCHARGE MEDICATIONS:  1. He was instructed to take aspirin 81 mg daily.  2. Plavix 75 mg daily x1 year.  3. Toprol XL 50 mg daily.  4. Lipitor 80 mg daily.   DISPOSITION:  He is discharged in stable and improved condition to  follow up with Dr. [name] at his [place] office 4 weeks following  discharge.   HISTORY OF PRESENT ILLNESS:  For full details, please refer to H&P as  dictated by Dr. Jana Hakim.  However, briefly, [name] [name] is a 60-  year-old male who presented to the emergency department with complaints  of left-sided chest pain radiating to the left arm.  He reports it  started in the afternoon and lasted approximately 35 minutes. He reports  having associated symptoms of lightheadedness and nausea. He reported  pain at its worst was 7 to 8/10.  He denied having any shortness of  breath or diaphoresis associated with the pain. He does have a prior  history of coronary artery disease and MI and reportedly had a cardiac  catheterization at [facility] about 10 years previously. In any event,  his EKG revealed a normal sinus rhythm without ST acute elevations or  abnormalities.  He was admitted for rule out.   HOSPITAL COURSE:  He underwent cardiology consultation after being  initiated on appropriate therapies for unstable angina including aspirin  and ultimately beta-blockers,  He underwent cardiac catheterization  after  consultation by [facility].  He underwent cardiac  catheterization per Dr. Eustace Quail. For full details, please refer to  the full catheterization report and note. His hospital course was  subsequently uneventful.  He was discharged in medically stable  condition to follow up with Dr. [name].      Cyril Mourning, D.O.  Electronically Signed     ESS/MEDQ  D:  01/14/2007  T:  01/15/2007  Job:  [id number]

## 2010-09-15 NOTE — H&P (Signed)
NAME:  Kumari, Sirr               ACCOUNT NO.:  [id number]   MEDICAL RECORD NO.:  [id number]          PATIENT TYPE:  INP   LOCATION:  3731                         FACILITY:  [place]   PHYSICIAN:  Jana Hakim, M.D. DATE OF BIRTH:  1950-05-14   DATE OF ADMISSION:  12/16/2006  DATE OF DISCHARGE:                              HISTORY & PHYSICAL   PRIMARY CARE PHYSICIAN:  Unassigned.   CHIEF COMPLAINT:  Chest pain.   HISTORY OF PRESENT ILLNESS:  This is a 60 year old male presenting to  the emergency department with complaints of left-sided chest pain  radiating into the left arm.  He reports it started in the afternoon and  lasted approximately 35 minutes.  He reports having associated symptoms  of lightheadedness and nausea.  He reports the pain at the worst was a 7-  8/10.  He denies having any shortness of breath or diaphoresis  associated with the pain.  He does have a previous history of coronary  artery disease/an MI. which he reports having a cardiac catheterization  10 years ago at [facility].  The patient  also has risk factors of tobacco and smokes one-half a pack of  cigarettes for 35 years.   PAST MEDICAL HISTORY:  1. Hypertension.  2. Coronary artery disease.  3. Nephrolithiasis.  4. History of testicular cancer, status post left orchiectomy and      reports status post radiation treatment.   1. Status post appendectomy.  2. COPD.  3. History per previous medical records of atrial fibrillation.   Medications at this time only include aspirin 81 mg one p.o. daily.   ALLERGIES:  No known drug allergies.   SOCIAL HISTORY:  The patient is married, a smoker, one half a pack per  day for 35 years, and no history of alcohol usage.   FAMILY HISTORY:  Negative for coronary artery disease.  Positive for  hypertension in both parents.  Positive for cancer in his father, who  had prostate cancer.   REVIEW OF SYSTEMS:  Pertinents are  mentioned above.   PHYSICAL EXAMINATION FINDINGS:  This is a 59 year old well-nourished,  well-developed male in no acute distress.  VITAL SIGNS:  Temperature 97.5, blood pressure 175/80, heart rate 91,  respirations 20, O2 saturations 98-100% on room air.  HEENT: Normocephalic, atraumatic.  Pupils equally round, reactive to  light.  Extraocular muscles are intact.  Funduscopic benign.  Oropharynx  is clear.  NECK:  Supple, full range of motion.  No thyromegaly, adenopathy or  jugular venous distension.  CARDIOVASCULAR:  Regular rate and rhythm.  No murmurs, gallops or rubs.  LUNGS:  Clear to auscultation bilaterally.  ABDOMEN:  Positive bowel sounds, soft, nontender, nondistended.  EXTREMITIES:  Without cyanosis, clubbing or edema.  NEUROLOGIC:  Alert and oriented x3.  Cranial nerves are intact.  Motor  and sensory function also intact.  GENITOURINARY/RECTAL:  Deferred.   LABORATORY STUDIES:  White blood cell count 7.5, hemoglobin 14.3,  hematocrit 41.5, platelets 237.  Sodium 141, potassium 3.6, chloride  107, CO2 27, BUN 8, creatinine 0.81, glucose 100.  Cardiac  enzymes:  Myoglobin 86.2, CK-MB 2.0, troponin less than 0.05.  EKG reveals a  normal sinus rhythm without acute ST-segment changes and chest x-ray PA  and lateral reveals no acute disease process.   ASSESSMENT:  A 60 year old male being admitted with:   1. Chest pain, rule out an acute myocardial infarction.  2. Hypertension.  3. Tobacco history.  4. History of testicular cancer.   PLAN:  The patient has been admitted to a telemetry area for cardiac  monitoring.  Cardiac enzymes will be performed.  The patient has been  placed on topical nitrates, oxygen, beta blocker therapy and aspirin  therapy.  The patient will also be placed on p.r.n. clonidine for  elevated blood pressures.  Pain control therapy has also been ordered  along with antiemetic therapy.  DVT and GI prophylaxis has also been  ordered.  Further workup  will ensue pending results of the patient's  full cardiac enzyme profile.      Jana Hakim, M.D.  Electronically Signed     HJ/MEDQ  D:  12/17/2006  T:  12/18/2006  Job:  [id number]

## 2010-09-15 NOTE — Assessment & Plan Note (Signed)
[place] CARDIOLOG[facility] OFFICE NOTE   [name] [name], CRABBE                      MRN:          [id number]  DATE:05/24/2007                            DOB:          [birth year]    [name] [name] returns today for follow-up.  He is known coronary artery  disease.  He has an occluded right with collaterals and had stenting of  an OM branch.  Has not had significant chest pain.  Since I last saw  him, he is stopped smoking.  He had been chewing a lot of Nicorette gum,  but is now just chewing regular gum.  I congratulated him on this.  We  talked at length about his smoking and explained to him that he would  have progressive disease if he were to restart.  He is fairly aggressive  vasculopath.  He has history of a penetrating aortic ulcer and had a  follow-up CT scan today.  I have not seen the results.  Generally is  doing well.  He is not coughing as much since he quit smoking.  Is not  having any significant chest pain.  He does have bilateral femoral  bruits, but his ABIs are fine and does not have claudication on  ambulation.  His review of systems otherwise negative.   Malachy Mood called his pharmacy and got his medications.  He is not taking  his Toprol by I honestly do not think he needs it since his pulse is  slow and his blood pressure is low.  He is taking Cozaar 50 a day,  Plavix 75 a day, p.r.n. nitroglycerin and an aspirin a day.   EXAM:  Remarkable for chronically ill-appearing middle-aged white male  in no distress.  Weight is 199.  Respirations are 16, afebrile, blood pressure is 110/90,  pulse 56.  HEENT:  Unremarkable.  No carotid bruits.  No lymphadenopathy,  thyromegaly, JVP elevation.  LUNGS:  Clear diaphragmatic motion.  No wheezing.  S1-S2 heart sounds.  PMI normal.  ABDOMEN:  Benign.  I do not hear any renal bruits.  No AAA.  No  hepatosplenomegaly or hepatojugular reflux.  He has bilateral femoral  bruits.   PTs were +2 no lower extremity edema.  NEURO:  Nonfocal.  No muscular weakness.   IMPRESSION:  1. Coronary disease total right with collaterals stenting of the OM      and no angina.  Follow-up in 6 months.  Continue aspirin and      Plavix.  No beta-blocker due to relative bradycardia.  2. History penetrating aortic ulcer no abdominal symptoms AAA not      palpable on exam.  Check results of CT scan, probably be able to      follow this on a yearly basis.  3. The patient will need a lipid and liver profile and I am somewhat      surprised that he is not on a statin.  One point he was suppose to      be in the IMPROVE-IT research study.  I will talk to the nurses to  see if he is followed through with this.  4. Peripheral vascular disease currently mild.  Follow-up ABIs in the      year no evidence of limiting claudication but bilateral femoral      bruits.  5. Hypertension currently well-controlled.  Continue Cozaar 50 mg a      day.  No need for additional beta-blocker.    Wallis Bamberg. [name], MD, [facility]  Electronically Signed   PCN/MedQ  DD: 05/24/2007  DT: 05/24/2007  Job #: [id number]

## 2010-09-17 ENCOUNTER — Encounter: Payer: Medicare Other | Admitting: *Deleted

## 2010-09-18 NOTE — Consult Note (Signed)
NAME:  [name] [name], [name] [name]                         ACCOUNT NO.:  [id number]   MEDICAL RECORD NO.:  [id number]                   PATIENT TYPE:  INP   LOCATION:  IC04                                 FACILITY:  [facility]   PHYSICIAN:  Felicie Morn, M.D.              DATE OF BIRTH:  February 18, 1951   DATE OF CONSULTATION:  07/14/2002  DATE OF DISCHARGE:                                   CONSULTATION   REASON FOR CONSULTATION:  Neurosurgery was asked to see this 60 year old  white male who came to the emergency room last night in the wee hours of the  morning of 07/14/2002, with abdominal pain.   CHIEF COMPLAINT:  Right lower quadrant pain with nausea and vomiting and  loss of appetite.   HISTORY OF PRESENT ILLNESS:  The patient stated that he felt this way on  Thursday, this was described as a right lower quadrant pain.  He developed  some nausea and vomiting, and on Friday, he called his family doctor, Dr.  Edrick Oh, who could not see him until 07/30/2002.  When the pain did not abate  he came to the emergency room on Saturday late in the evening.  Surgery was  consulted in the early morning.  The emergency room physician had obtained a  CT scan which showed appendicitis.   PHYSICAL EXAMINATION:  GENERAL:  This is a pleasant 60 year old white male,  uncomfortable, but in no acute distress.  VITAL SIGNS:  He is 6 feet tall, weighs 187 pounds, his blood pressure is  170/90, heart rate 78 per minute, respirations 20 per minute.  HEENT:  Head is normocephalic.  Eyes:  Extraocular movements were intact.  Pupils equal, round, reactive to light and accommodation.  There is no  conjunctive pallor or scleral injection.  The sclerae is in normal tincture.  NECK:  Supple and slender otherwise, no bruits, no adenopathy, no jugular  venous distention or adenopathy.  CHEST:  Clear both anteriorly and posteriorly to auscultation.  HEART:  Regular rhythm.  ABDOMEN:  Bowel sounds are diminished.  The patient  has localized rebound in  the right lower quadrant.  He has no hernias.  He is status post right  orchiectomy.  RECTAL:  The prostate is smooth.  The stool is guaiac negative.  EXTREMITIES:  Within normal limits.   REVIEW OF SYMPTOMS:  GASTROINTESTINAL:  The patient has a history of right  lower quadrant, nausea and vomiting, diminished appetite since Thursday.  GENITOURINARY:  History of right testicular cancer treated with external  beam radiation and surgery.  He has had a past history of nephrolithiasis.  No history of dysuria.  CARDIORESPIRATORY:  The patient smokes 1/2 pack of  cigarettes a day.  He had a myocardial infarction two years ago.  He was  seen and had a stress test at [facility] and has been stable from this with no  signs of angina, shortness of  breath, or chest pain.  He has a history of  hypercholesterolemia and a history of hypertension.  ENDOCRINE:  No history  of diabetes or thyroid disease.  MUSCULOSKELETAL:  Within normal limits.  SOCIOECONOMIC:  The patient is no longer working.  He used to work as a  Freight forwarder.   MEDICATIONS:  1. Aspirin 325 mg one daily.  2. Benazepril 20 mg p.o. daily.  3. Toprol XL 100 mg p.o. daily.  4. Hydrochlorothiazide 25 mg p.o. daily.  5. Niaspan 500 mg p.o. q hours sleep.  6. Lipitor 10 mg daily.   ALLERGIES:  No known drug allergies.   IMPRESSION:  Acute appendicitis.   SECONDARY DIAGNOSES:  1. Coronary artery disease with a history of myocardial infarction two years     ago.  2. History of testicular cancer, treated with surgery and external beam     radiation.  3. Tobacco abuse.   HABITS:  The patient is a nondrinker and does not use any recreational  drugs.   PLAN:  The patient was admitted last night.  IVs have been initiated and  antibiotics have been started.  He is scheduled for surgery this a.m.  We  will take him to surgery.  He has been made n.p.o., and hydration and  antibiotics have been  initiated.   I discussed surgery in detail with the patient, including complications, not  limited to, but including bleeding, infection, and the possibility that a  drain may need to be placed.  Informed consent was obtained.   LABORATORY DATA:  His white count is 16,000, with a hemoglobin and  hematocrit of 16.3 and 46.8.  He has 88 neutrophils noted.  His electrolytes  are grossly within normal limits.  His sodium is 133, potassium of 4.1.  Glucose is 135, BUN is 12, and creatinine is 1.2.  Liver function tests are  grossly within normal limits.  Alkaline phosphatase is slightly elevated at  130.  Albumin is 3.6.  Calcium is 8.9.  Amylase and lipase are not elevated.  Urinalysis is grossly within normal limits.  CT scan showed positive  appendicitis with appendolith.  Electrocardiogram showed a normal sinus  rhythm with a rate of 80 per minute, considered left atrial enlargement, and  an old inferior myocardial infarction.                                               Felicie Morn, M.D.    WB/MEDQ  D:  07/14/2002  T:  07/14/2002  Job:  [id number]   cc:   Margarita Rana, M.D.  [name]  [place]  [place] [address]  Fax: [phone]

## 2010-09-18 NOTE — Group Therapy Note (Signed)
   NAME:  [name] [name], [name] [name]                         ACCOUNT NO.:  [id number]   MEDICAL RECORD NO.:  [id number]                   PATIENT TYPE:  INP   LOCATION:  IC09                                 FACILITY:  [facility]   PHYSICIAN:  Edward L. Luan Pulling, M.D.             DATE OF BIRTH:  1951/04/19   DATE OF PROCEDURE:  DATE OF DISCHARGE:                                   PROGRESS NOTE   PROBLEMS:  1. Atrial fibrillation.  2. Coronary artery occlusive disease.  3. Right lower lobe pneumonia.  4. Chronic obstructive pulmonary disease.  5. Status post appendectomy.   SUBJECTIVE:  The patient says he is feeling much better.  He has no new  complaints.   PHYSICAL EXAMINATION:  VITAL SIGNS:  His heart rate is about 90 and he looks  like he is in sinus rhythm now.  His blood pressure 150/80.  CHEST:  Much clearer than yesterday.  ABDOMEN:  Soft.  EXTREMITIES:  No edema.   ASSESSMENT:  He does seem to be better.  His chest x-ray yesterday did not  look as good as the others but I think some of that is a clinical lag of  chest x-ray behind the clinical course.  Overall   Dictation ended at this point.                                               Edward L. Luan Pulling, M.D.    ELH/MEDQ  D:  07/23/2002  T:  07/23/2002  Job:  [id number]

## 2010-09-18 NOTE — Op Note (Signed)
NAME:  [name] [name], [name] [name]                         ACCOUNT NO.:  [id number]   MEDICAL RECORD NO.:  [id number]                   PATIENT TYPE:  INP   LOCATION:  A313                                 FACILITY:  [facility]   PHYSICIAN:  Felicie Morn, M.D.              DATE OF BIRTH:  [birth year]   DATE OF PROCEDURE:  07/14/2002  DATE OF DISCHARGE:                                 OPERATIVE REPORT   PREOPERATIVE DIAGNOSIS:  Acute appendicitis, possible perforation.   POSTOPERATIVE DIAGNOSIS:  Appendicitis with perforation.   PROCEDURE:  Appendectomy.   SPECIMEN:  Appendiceal Rosene.   NOTE:  This is a 60 year old white male, who had lower abdominal pain  accompanied with nausea and vomiting since Thursday.  He was unable to see  his family practice physician and came to the emergency room in the wee  hours of Saturday morning.  Surgery was consulted.  The patient had a  significant cardiac history.  Hydration and antibiotics were initiated, and  he was taken to surgery the first thing in the morning.  He gave the history  of having lower abdominal pain accompanied with nausea and vomiting and  diminished appetite for which he treated himself with aspirin and basically  tried to be stoic with his discomfort until he had peritoneal signs and came  to the emergency room.   GROSS OPERATIVE FINDINGS:  The patient had a necrotic end of the F[place] of  the appendicitis with inflamed fat around it.  The rest of the right lower  quadrant was within normal limits.  Terminal ileum was normal.   TECHNIQUE:  The patient was placed in the supine position and after the  adequate administration of general anesthesia via endotracheal intubation,  his entire abdomen was prepped with Betadine solution and draped in the  usual manner.  A low transverse incision was carried out through skin,  subcutaneous tissue, and Scarpa's layer down through the fascia and  retracting the abdominal wall muscles medially, the  rectus sheath medially,  the peritoneum was grasped and opened, and the abdomen was explored with the  above findings.  I freed up the cecum, ligated the mesoappendix with 2-0  silk and with silver clips, and then amputated this with using an EndoGIA  stapling device.  I oversewed the Casillas of the appendix and because of the  perforation area, I elected to leave a drain in place.  After irrigating and  changing gloves, we then closed the peritoneum with a running 0 Polysorb and  closed the fascia with an interrupted 0 Polysorbs, irrigated the subcu and  packed it open with Iodoform gauze, placing between this loosely placed  staples and untied Prolene sutures to perform a delayed closure later if  there is no inflammation to the wound.  The Jackson-Pratt drain was sutured in place with 3-0 nylon.  Prior to  closure all sponge, needle, and instrument counts were  found to be correct.  Estimated blood loss was minimal.  The patient was re-placed with  crystalloids.  There were no complications and no tracheal intubation.                                               Felicie Morn, M.D.    WB/MEDQ  D:  07/14/2002  T:  07/14/2002  Job:  [id number]   cc:   Margarita Rana, M.D.  [address].  [place]  [place] [address]  Fax: [phone]

## 2010-09-18 NOTE — Group Therapy Note (Signed)
   NAME:  Astle, Chaseton E                         ACCOUNT NO.:  [id number]   MEDICAL RECORD NO.:  [id number]                   PATIENT TYPE:  INP   LOCATION:  IC09                                 FACILITY:  [facility]   PHYSICIAN:  Edward L. Luan Pulling, M.D.             DATE OF BIRTH:  November 06, 1950   DATE OF PROCEDURE:  DATE OF DISCHARGE:                                   PROGRESS NOTE   PROBLEMS:  1. Pneumonia.  2. Status post appendectomy.  3. Chronic obstructive pulmonary disease.  4. Atrial fibrillation.  5. History of coronary disease.   SUBJECTIVE:  The patient has responded very well to incentive spirometry and  cough and deep breathing.  He says he feels better and in general he does  look better.  His saturation is better.  His heart rate is lower.  His chest  is clearer to examination.  Chest x-ray does not show a dense infiltrate as  seen on the CT scan.   ASSESSMENT:  Things are better.   PLAN:  Continue with conservative treatment for now.  If it turns out that  he needs something like a bronchoscopy, of course, I would be glad to do it  but at this point it looks like he may turn this around with conservative  treatment.                                               Edward L. Luan Pulling, M.D.    ELH/MEDQ  D:  07/19/2002  T:  07/19/2002  Job:  [id number]

## 2010-09-18 NOTE — Group Therapy Note (Signed)
   NAME:  [name] [name], [name] [name]                         ACCOUNT NO.:  [id number]   MEDICAL RECORD NO.:  [id number]                   PATIENT TYPE:  INP   LOCATION:  A217                                 FACILITY:  [facility]   PHYSICIAN:  Edward L. Luan Pulling, M.D.             DATE OF BIRTH:  1950/12/12   DATE OF PROCEDURE:  07/25/2002  DATE OF DISCHARGE:                                   PROGRESS NOTE   PROBLEMS:  1. Pneumonia.  2. Atrial fibrillation.  3. Coronary disease.  4. Status post appendectomy.   SUBJECTIVE:  The patient says he is feeling pretty well and has no  complaints.  He says he is not short of breath.  He is not having any cough,  fever, or sputum production.   OBJECTIVE:  His physical examination today shows his temperature is 98,  pulse 94, respirations 15, blood pressure 130/76, O2 saturation 97%, is on 3  liters of O2.  His I&O is -1394.  Prothrombin time is 14.6.   ASSESSMENT:  He is better.  His chest is much clearer than it has been; his  heart is regular now.   PLAN:  Continue his treatments and medications.  I am going to increase his  Coumadin slightly.  We are working on getting his INR therapeutic so that he  can go home.  He is much improved in general.                                               Edward L. Luan Pulling, M.D.    ELH/MEDQ  D:  07/25/2002  T:  07/25/2002  Job:  [id number]

## 2010-09-18 NOTE — Group Therapy Note (Signed)
   NAME:  Lehenbauer, Nyaire E                         ACCOUNT NO.:  [id number]   MEDICAL RECORD NO.:  [id number]                   PATIENT TYPE:  INP   LOCATION:  A217                                 FACILITY:  [facility]   PHYSICIAN:  Edward L. Luan Pulling, M.D.             DATE OF BIRTH:  23-Apr-1951   DATE OF PROCEDURE:  DATE OF DISCHARGE:                                   PROGRESS NOTE   PROBLEMS:  1. Status post appendectomy.  2. Chronic obstructive pulmonary disease.  3. Pneumonia.  4. Atrial fibrillation, resolved.  5. Coronary artery occlusive disease.   SUBJECTIVE:  Mr. Petrosino says he is feeling pretty well this morning.  He has  no complaints.  He says he is not shortness of breath. He is having some  chest pain.  He has some minimal incisional pain.   OBJECTIVE:  His physical exam shows that his heart rate is about 90.  Blood  pressure is 147/87.  Temperature is 97.  O2 saturation is 96%.  His chest is  clearer.  He still has some minimal rhonchi on the right.  His heart is  regular now. His abdomen is soft.   ASSESSMENT:  I believe that the note from Dr. Rollene Fare says that he should  be anticoagulated for a period of several months and that will be the plan.  He is now on Heparin. He was started on Coumadin yesterday.  He will need to  be fully anticoagulated with an INR of 1.7 or better before he goes.   His labs this morning shows that his magnesium is normal.  His electrolytes  are normal with the exception of a blood sugar of 118 and a calcium of 8.3.   PLAN:  He will be okay to transfer to my service, and transfer from the  intensive care unit if that is okay with Dr. Romona Curls. We will change his  antibiotics.  He is Coumadin already and we will get him up and move him  around probably with PT help.                                               Edward L. Luan Pulling, M.D.    ELH/MEDQ  D:  07/24/2002  T:  07/25/2002  Job:  [id number]

## 2010-09-18 NOTE — Group Therapy Note (Signed)
   NAME:  [name] [name], [name] [name]                         ACCOUNT NO.:  [id number]   MEDICAL RECORD NO.:  [id number]                   PATIENT TYPE:  INP   LOCATION:  IC09                                 FACILITY:  [facility]   PHYSICIAN:  Edward L. Luan Pulling, M.D.             DATE OF BIRTH:  [birth year]   DATE OF PROCEDURE:  07/20/2002  DATE OF DISCHARGE:                                   PROGRESS NOTE   SUBJECTIVE:  The patient says he is not doing as well.  He started having  abdominal pain last night and says he is having difficulty passing gas.  He  feels like he is in a vise.  He is otherwise doing okay and has no other  complaints.  He says he is not short of breath.   PHYSICAL EXAMINATION:  CHEST:  His chest is actually pretty clear.  HEART:  Irregular because of the atrial fibrillation.  ABDOMEN:  Soft.  He does have the NG tube back in place now, however.  VITAL SIGNS:  His heart rate is about 90, blood pressure 126/74.   ASSESSMENT:  Lungwise, he does seem to be doing okay but his problems with  his abdomen of course may end up impacting his ability to cough.   PLAN:  My plan is to go ahead and get a chest x-ray today.  We will leave  management of the abdominal problem of course to Dr. Romona Curls.                                               Edward L. Luan Pulling, M.D.    ELH/MEDQ  D:  07/20/2002  T:  07/20/2002  Job:  [id number]

## 2010-09-18 NOTE — Discharge Summary (Signed)
NAME:  [name] [name], [name] [name]                         ACCOUNT NO.:  [id number]   MEDICAL RECORD NO.:  [id number]                   PATIENT TYPE:  INP   LOCATION:  A217                                 FACILITY:  [facility]   PHYSICIAN:  Edward L. Luan Pulling, M.D.             DATE OF BIRTH:  11-22-1950   DATE OF ADMISSION:  07/13/2002  DATE OF DISCHARGE:  07/28/2002                                 DISCHARGE SUMMARY   FINAL DISCHARGE DIAGNOSES:  1. Acute appendicitis with a rupture.  2. Chronic obstructive pulmonary disease.  3. Atrial fibrillation.  4. History of acute myocardial infarction and coronary artery occlusive     disease.  5. History of testicular cancer.  6. Acute urinary retention.  7. Atelectasis and pneumonia of the right lung.   HISTORY OF PRESENT ILLNESS AND HOSPITAL COURSE:  The patient is a 60-year-  old who came to the emergency room because of abdominal pain.  He has had a  previous history of problems with coronary disease, having had a myocardial  infarction; with COPD and with history of a testicular cancer.  He was  evaluated in the emergency room and found to have what appeared to be acute  appendicitis and underwent surgery per Dr. Romona Curls at which time he was  found to have a ruptured appendix.  He went through surgery fairly well.  His exam postoperatively shows that he developed atrial fibrillation.  His  chest was fairly clear.  His heart was regular.  His abdomen was soft, but  of course he was postoperative.  He initially did quite well but then  developed the atrial fibrillation, developed what appeared to be a  postoperative atelectasis and possibly pneumonia in the right lung.  Cardiology and pulmonary consultations were obtained by Dr. Romona Curls at that  time.  He was anticoagulated and started on Cardizem.  He was given  nebulizer treatments, inhaled bronchodilators, mucolytics, and antibiotics.  He improved as far as his chest situation was concerned over  the next  several days to the point that he was moved from the ICU.  He was  transferred to my service at approximately that time.  He was started on  Coumadin and heparin.  He took some time becoming therapeutically  anticoagulated.  He has acute urinary retention and had to have a Foley  catheter placed.  I discussed his case with Dr. Ellouise Newer of the  [facility] Cardiology team who had consulted on his case.  Dr. Claiborne Billings felt  that despite not being therapeutically anticoagulated on Coumadin, that the  patient had reverted to sinus rhythm and he could be discharged on Lovenox  and Coumadin and followed up from there.  This was accomplished on  07/28/2002.   DISCHARGE MEDICATIONS:  1. Lovenox 1.5 mg per kg daily which works out to 120 mg a day.  2. Coumadin 10 mg a day.  3. __________ 240 one  daily.  4. Metoprolol 50 mg __________ .  5. Lipitor 10 mg daily.   FOLLOW UP:  He is to be followed up in the urology office, cardiology  office, in my office, as well as with Dr. Romona Curls.   NOTE:  In addition to the above, the patient has agreed to stop smoking  cigarettes now.  That will help with his COPD.  He is only on Levaquin for  five more days.                                               Edward L. Luan Pulling, M.D.    ELH/MEDQ  D:  07/28/2002  T:  07/29/2002  Job:  [id number]

## 2010-09-18 NOTE — Consult Note (Signed)
NAME:  [name] [name], [name] [name]                         ACCOUNT NO.:  [id number]   MEDICAL RECORD NO.:  [id number]                   PATIENT TYPE:  INP   LOCATION:  IC09                                 FACILITY:  [facility]   PHYSICIAN:  Edward L. Luan Pulling, M.D.             DATE OF BIRTH:  [birth year]   DATE OF CONSULTATION:  07/18/2002  DATE OF DISCHARGE:                                   CONSULTATION   REASON FOR CONSULTATION:  Abnormal chest x-ray.   HISTORY:  The patient is a 60 year old who had surgery on July 14, 2002  with a perforation.  Since that time he has been in the intensive care unit  with a number of problems.  He has had atrial fibrillation with a fairly  rapid ventricular response, hypertension, coronary artery occlusive disease  status post MI x2, hyperlipidemia, probably COPD, history of atrial  fibrillation with rapid ventricular response.  He is now on Lopressor 5 mg  IV q.8h., Flagyl 500 mg q.8h., Cipro 400 mg q.12h., heparin in a continuous  infusion, Toradol for pain, Cardizem continuous infusion, Xopenex 1.25 q.6h.  He became more hypoxemic today and became diaphoretic.  He has had a CT scan  of the chest which showed no pulmonary emboli but right lower lobe  infiltrate with consolidations, some atelectasis, and what appeared to be an  ileus.  The concern, of course, is that he is not coughing well.  He does  have what appears to be COPD and there is, of course, some concern about  whether he is going to be able to maintain off the ventilator.   PAST MEDICAL HISTORY:  Positive for coronary artery occlusive disease,  apparently with two previous episodes of myocardial infarction.  He has a  fairly remote history of testicular cancer.   SOCIAL HISTORY:  He smokes about one-half to one package of cigarettes daily  and has for many years.   REVIEW OF SYSTEMS:  Except as mentioned, is essentially negative.   FAMILY HISTORY:  Positive for hypertension, colon cancer, and  coronary  artery occlusive disease.   PHYSICAL EXAMINATION:  GENERAL:  Shows that he is awake and alert.  VITAL SIGNS:  Blood pressure is running in the 0000000 systolic, pulse is  around 90; he is afebrile now.  HEENT:  Shows his pupils are equal, round, reactive to light and  accomodation.  Mucous membranes are slightly dry.  CHEST:  Shows rhonchi bilaterally but he does have a pretty good cough  effort and he is able to move some sputum.  HEART:  Irregularly irregular but with a better controlled rate.  ABDOMEN:  With sluggish bowel sounds; I really did not do much of an  abdominal exam, deferring that to Dr. Romona Curls.   ASSESSMENT AND RECOMMENDATIONS:  His x-ray shows, as mentioned, no emboli;  right lower lobe infiltrate with consolidation suggestive of pneumonia.  His  white blood  count is not particularly high.  He is on antibiotics which I  think should probably provide coverage, but I am going to go ahead and add a  little bit better  gram positive coverage at this point.  I am going to have him do incentive  spirometry, deep breathing, and coughing every hour.  Add Mucomyst to his  nebulizer treatments.  Repeat a chest x-ray in the morning; if it does not  look better he will probably need bronchoscopy to try to reevaluate this.                                               Edward L. Luan Pulling, M.D.    ELH/MEDQ  D:  07/18/2002  T:  07/18/2002  Job:  [id number]

## 2010-09-18 NOTE — Group Therapy Note (Signed)
   NAME:  [name] [name], [name] [name]                         ACCOUNT NO.:  [id number]   MEDICAL RECORD NO.:  [id number]                   PATIENT TYPE:  INP   LOCATION:  A217                                 FACILITY:  [facility]   PHYSICIAN:  Edward L. Luan Pulling, M.D.             DATE OF BIRTH:  06-11-1950   DATE OF PROCEDURE:  07/27/2002  DATE OF DISCHARGE:  07/28/2002                                   PROGRESS NOTE   PROBLEMS:  1. Chronic obstructive pulmonary disease.  2. Pneumonia.  3. Appendicitis with rupture.  4. Coronary disease.  5. Atrial fibrillation.   SUBJECTIVE:  The patient continues to improve.  He has no new complaints.  He says that he is not short of breath.  He has no other new complaints.  He  would like to get his Foley catheter out.   OBJECTIVE:  His exam today shows that his blood pressure is 120/70, pulse is  80 and regular, his respirations are 18, he is afebrile.   ASSESSMENT:  He is better.   PLAN:  He is going to be discharged when his INR is therapeutic.  His INR is  about 1.4 today.  I have put in a call to the urology team to see if there  is any opportunity for [place] to take the catheter out before he goes home since  he is doing so much better, but I have not received an answer as yet.                                               Edward L. Luan Pulling, M.D.    ELH/MEDQ  D:  07/27/2002  T:  07/29/2002  Job:  [id number]

## 2010-09-18 NOTE — Group Therapy Note (Signed)
   NAME:  Spina, Dominque E                         ACCOUNT NO.:  [id number]   MEDICAL RECORD NO.:  [id number]                   PATIENT TYPE:  INP   LOCATION:  IC09                                 FACILITY:  [facility]   PHYSICIAN:  Edward L. Luan Pulling, M.D.             DATE OF BIRTH:  06-29-1950   DATE OF PROCEDURE:  DATE OF DISCHARGE:                                   PROGRESS NOTE   PRIMARY CARE PHYSICIAN:  Felicie Morn, M.D.   SUBJECTIVE:  Mr. Scaccia says that he feels better today.  His abdomen is less  distended and less uncomfortable.  He is still coughing and coughing up  sputum. His white blood count is 9500.  Hemoglobin is 10.8, hematocrit 31,  and platelets 272.  His BNP is normal with the exception of calcium of 7.8.  His PTT 107.  He is on heparin.  Chest x-ray yesterday looked much better.   OBJECTIVE:  His exam shows that his pulse is in the 80s; blood pressure  around 123456 systolic.  His chest is clear with some rhonchi on the right. He  can cough up sputum.  His heart is irregularly irregular.   ASSESSMENT:  He seems to be doing better.   PLAN:  The plan is to continue his treatments and medications without  change.  We will get another chest x-ray tomorrow.                                               Edward L. Luan Pulling, M.D.    ELH/MEDQ  D:  07/21/2002  T:  07/22/2002  Job:  [id number]

## 2010-09-18 NOTE — Group Therapy Note (Signed)
   NAME:  [name] [name], [name] [name]                         ACCOUNT NO.:  [id number]   MEDICAL RECORD NO.:  [id number]                   PATIENT TYPE:  INP   LOCATION:  A217                                 FACILITY:  [facility]   PHYSICIAN:  Edward L. Luan Pulling, M.D.             DATE OF BIRTH:  July 30, 1950   DATE OF PROCEDURE:  DATE OF DISCHARGE:                                   PROGRESS NOTE   PROBLEMS:  1. Chronic obstructive pulmonary disease.  2. Atrial fibrillation, resolved.  3. Coronary artery disease.  4. Status post appendectomy.   SUBJECTIVE:  The patient says he is feeling well and has no complaints.  He  says his breathing is pretty good.  He has no other new problems.   PHYSICAL EXAMINATION:  CHEST:  Actually fairly clear without any wheezes.  He still has some rhonchi on the right.  HEART:  Regular with a rate about 80.  ABDOMEN:  Soft.  EXTREMITIES:  No edema.  CNS:  Grossly intact.   LABORATORIES:  His prothrombin time is still not therapeutic so he is not  ready for discharge.   ASSESSMENT:  He is doing better.   PLAN:  Continue with his antibiotics which is Levaquin.  He still has a  Foley catheter in place which he is going to have to go home with.  His COPD  seems pretty well controlled.  He is in sinus and he does need  anticoagulation.  He is going to need to be discharged as soon as his  anticoagulation is adequate.                                               Edward L. Luan Pulling, M.D.    ELH/MEDQ  D:  07/26/2002  T:  07/27/2002  Job:  [id number]

## 2010-09-24 ENCOUNTER — Ambulatory Visit (INDEPENDENT_AMBULATORY_CARE_PROVIDER_SITE_OTHER): Payer: Medicare Other | Admitting: *Deleted

## 2010-09-24 DIAGNOSIS — I4891 Unspecified atrial fibrillation: Secondary | ICD-10-CM

## 2010-09-24 DIAGNOSIS — Z7901 Long term (current) use of anticoagulants: Secondary | ICD-10-CM | POA: Insufficient documentation

## 2010-09-24 LAB — POCT INR: INR: 3.9

## 2010-10-22 ENCOUNTER — Ambulatory Visit (INDEPENDENT_AMBULATORY_CARE_PROVIDER_SITE_OTHER): Payer: Medicare Other | Admitting: *Deleted

## 2010-10-22 DIAGNOSIS — I4891 Unspecified atrial fibrillation: Secondary | ICD-10-CM

## 2010-10-22 LAB — POCT INR: INR: 3.3

## 2010-11-19 ENCOUNTER — Ambulatory Visit (INDEPENDENT_AMBULATORY_CARE_PROVIDER_SITE_OTHER): Payer: Medicare Other | Admitting: *Deleted

## 2010-11-19 DIAGNOSIS — I4891 Unspecified atrial fibrillation: Secondary | ICD-10-CM

## 2010-11-19 LAB — POCT INR: INR: 1.7

## 2010-12-07 ENCOUNTER — Encounter (INDEPENDENT_AMBULATORY_CARE_PROVIDER_SITE_OTHER): Payer: Medicare Other

## 2010-12-07 DIAGNOSIS — R0989 Other specified symptoms and signs involving the circulatory and respiratory systems: Secondary | ICD-10-CM

## 2010-12-17 ENCOUNTER — Ambulatory Visit (INDEPENDENT_AMBULATORY_CARE_PROVIDER_SITE_OTHER): Payer: Medicare Other | Admitting: *Deleted

## 2010-12-17 DIAGNOSIS — I4891 Unspecified atrial fibrillation: Secondary | ICD-10-CM

## 2010-12-17 LAB — POCT INR: INR: 2.4

## 2011-01-05 ENCOUNTER — Other Ambulatory Visit: Payer: Self-pay | Admitting: Cardiology

## 2011-01-14 ENCOUNTER — Encounter: Payer: Medicare Other | Admitting: *Deleted

## 2011-01-28 ENCOUNTER — Ambulatory Visit (INDEPENDENT_AMBULATORY_CARE_PROVIDER_SITE_OTHER): Payer: Medicare Other | Admitting: *Deleted

## 2011-01-28 DIAGNOSIS — I4891 Unspecified atrial fibrillation: Secondary | ICD-10-CM

## 2011-01-28 LAB — POCT INR: INR: 2.3

## 2011-02-12 LAB — CBC
HCT: 36.7 — ABNORMAL LOW
HCT: 37 — ABNORMAL LOW
HCT: 38.5 — ABNORMAL LOW
HCT: 41
HCT: 41.5
Hemoglobin: 12.6 — ABNORMAL LOW
Hemoglobin: 12.9 — ABNORMAL LOW
Hemoglobin: 13.1
Hemoglobin: 14
Hemoglobin: 14.3
MCHC: 34.2
MCHC: 34.2
MCHC: 34.5
MCHC: 34.5
MCHC: 34.8
MCV: 87.3
MCV: 87.6
MCV: 87.6
MCV: 87.9
MCV: 88.3
Platelets: 201
Platelets: 202
Platelets: 207
Platelets: 237
Platelets: 271
RBC: 4.19 — ABNORMAL LOW
RBC: 4.2 — ABNORMAL LOW
RBC: 4.36
RBC: 4.67
RBC: 4.75
RDW: 13.4
RDW: 13.4
RDW: 13.6
RDW: 13.6
RDW: 13.8
WBC: 7
WBC: 7.1
WBC: 7.1
WBC: 7.5
WBC: 8.8

## 2011-02-12 LAB — DIFFERENTIAL
Basophils Absolute: 0
Basophils Absolute: 0
Basophils Relative: 0
Basophils Relative: 0
Eosinophils Absolute: 0.1
Eosinophils Absolute: 0.2
Eosinophils Relative: 2
Eosinophils Relative: 2
Lymphocytes Relative: 25
Lymphocytes Relative: 34
Lymphs Abs: 2.2
Lymphs Abs: 2.5
Monocytes Absolute: 0.7
Monocytes Absolute: 0.8 — ABNORMAL HIGH
Monocytes Relative: 11
Monocytes Relative: 8
Neutro Abs: 3.9
Neutro Abs: 5.7
Neutrophils Relative %: 53
Neutrophils Relative %: 65

## 2011-02-12 LAB — BASIC METABOLIC PANEL
BUN: 12
BUN: 12
BUN: 14
BUN: 7
CO2: 25
CO2: 26
CO2: 29
CO2: 30
Calcium: 8.7
Calcium: 8.9
Calcium: 9.1
Calcium: 9.2
Chloride: 103
Chloride: 105
Chloride: 106
Chloride: 107
Creatinine, Ser: 0.72
Creatinine, Ser: 0.84
Creatinine, Ser: 0.92
Creatinine, Ser: 1.14
GFR calc Af Amer: >60
GFR calc Af Amer: >60
GFR calc Af Amer: >60
GFR calc Af Amer: >60
GFR calc non Af Amer: >60
GFR calc non Af Amer: >60
GFR calc non Af Amer: >60
GFR calc non Af Amer: >60
Glucose, Bld: 101 — ABNORMAL HIGH
Glucose, Bld: 101 — ABNORMAL HIGH
Glucose, Bld: 92
Glucose, Bld: 96
Potassium: 4
Potassium: 4
Potassium: 4.1
Potassium: 4.2
Sodium: 138
Sodium: 140
Sodium: 141
Sodium: 142

## 2011-02-12 LAB — PSA: PSA: 0.51

## 2011-02-12 LAB — COMPREHENSIVE METABOLIC PANEL
ALT: 12
AST: 14
Albumin: 3.2 — ABNORMAL LOW
Alkaline Phosphatase: 101
BUN: 8
CO2: 27
Calcium: 9.2
Chloride: 107
Creatinine, Ser: 0.87
GFR calc Af Amer: >60
GFR calc non Af Amer: >60
Glucose, Bld: 100 — ABNORMAL HIGH
Potassium: 3.6
Sodium: 141
Total Bilirubin: 0.4
Total Protein: 6.9

## 2011-02-12 LAB — CARDIAC PANEL(CRET KIN+CKTOT+MB+TROPI)
CK, MB: 1.1
CK, MB: 1.2
CK, MB: 1.3
Relative Index: INVALID
Relative Index: INVALID
Relative Index: INVALID
Total CK: 34
Total CK: 35
Total CK: 38
Troponin I: 0.01
Troponin I: 0.02
Troponin I: 0.02

## 2011-02-12 LAB — LIPID PANEL
Cholesterol: 134
HDL: 22 — ABNORMAL LOW
LDL Cholesterol: 87
Total CHOL/HDL Ratio: 6.1
Triglycerides: 127
VLDL: 25

## 2011-02-12 LAB — RAPID URINE DRUG SCREEN, HOSP PERFORMED
Amphetamines: NOT DETECTED
Barbiturates: NOT DETECTED
Benzodiazepines: POSITIVE — AB
Cocaine: NOT DETECTED
Opiates: NOT DETECTED
Tetrahydrocannabinol: NOT DETECTED

## 2011-02-12 LAB — PROTIME-INR
INR: 1
INR: 1
Prothrombin Time: 13.4
Prothrombin Time: 13.7

## 2011-02-12 LAB — POCT CARDIAC MARKERS
CKMB, poc: 2
CKMB, poc: <1 — ABNORMAL LOW
Myoglobin, poc: 67.9
Myoglobin, poc: 86.2
Operator id: 161631
Operator id: 272551
Troponin i, poc: <0.05
Troponin i, poc: <0.05

## 2011-02-12 LAB — CK TOTAL AND CKMB (NOT AT ARMC)
CK, MB: 1.2
Relative Index: INVALID
Total CK: 42

## 2011-02-12 LAB — APTT
aPTT: 34
aPTT: 35

## 2011-02-12 LAB — HOMOCYSTEINE: Homocysteine: 8.9

## 2011-02-12 LAB — TSH: TSH: 0.65

## 2011-02-12 LAB — TROPONIN I: Troponin I: 0.01

## 2011-02-25 ENCOUNTER — Ambulatory Visit (INDEPENDENT_AMBULATORY_CARE_PROVIDER_SITE_OTHER): Payer: Medicare Other | Admitting: *Deleted

## 2011-02-25 DIAGNOSIS — I4891 Unspecified atrial fibrillation: Secondary | ICD-10-CM

## 2011-02-25 DIAGNOSIS — Z7901 Long term (current) use of anticoagulants: Secondary | ICD-10-CM

## 2011-02-25 LAB — POCT INR: INR: 2

## 2011-03-29 ENCOUNTER — Ambulatory Visit (INDEPENDENT_AMBULATORY_CARE_PROVIDER_SITE_OTHER): Payer: Medicare Other | Admitting: *Deleted

## 2011-03-29 DIAGNOSIS — I4891 Unspecified atrial fibrillation: Secondary | ICD-10-CM

## 2011-03-29 DIAGNOSIS — Z7901 Long term (current) use of anticoagulants: Secondary | ICD-10-CM

## 2011-03-29 LAB — POCT INR: INR: 2.5

## 2011-04-07 ENCOUNTER — Other Ambulatory Visit: Payer: Medicare Other

## 2011-04-07 ENCOUNTER — Other Ambulatory Visit: Payer: Self-pay | Admitting: *Deleted

## 2011-04-07 ENCOUNTER — Telehealth: Payer: Self-pay | Admitting: *Deleted

## 2011-04-07 MED ORDER — CARVEDILOL 25 MG PO TABS: 25.0000 mg | ORAL_TABLET | Freq: Two times a day (BID) | ORAL | Status: DC

## 2011-04-07 NOTE — Telephone Encounter (Signed)
Per Ermalinda Barrios, PA, Spoke with patient regarding increasing Coreg to 25 mg twice daily, Nurse visit on Friday and follow up visit next week with Dr Lattie Haw.  Verbalizes understanding.

## 2011-04-07 NOTE — Research (Signed)
Subject came in today for a routine visit for the IMPROVE IT Study.  Initially his BP was 164/121.  Subsequent readings 10 minutes apart were 160/112 and 164/112.  Maple Hudson was notified of the elevated BP and she ordered that the subject increase his Coreg to 25 mg BID.  He was scheduled to return to the clinic for a follow-up visit.  Subject had no symptoms associated with the elevated BP.  Pulse was 81.  Subject was given instructions to increase his Coreg to 25 mg BID and that he would be getting a call from the office to schedule his appointment to follow-up on his BP.  Written instructions were also given to the subject.

## 2011-04-09 ENCOUNTER — Ambulatory Visit (INDEPENDENT_AMBULATORY_CARE_PROVIDER_SITE_OTHER): Payer: Medicare Other

## 2011-04-09 VITALS — BP 155/88 | HR 74 | Wt 200.8 lb

## 2011-04-09 DIAGNOSIS — I10 Essential (primary) hypertension: Secondary | ICD-10-CM

## 2011-04-09 NOTE — Progress Notes (Signed)
Pt returns today for BP check after starting Coreg 25 mg bid.  States he has not had it this am.  No complaints noted at this time.  Has appt to see Dr Lattie Haw on December 20th.

## 2011-04-21 NOTE — Progress Notes (Signed)
Patient ID: [name] [name], male   DOB: 09-16-1950, 60 y.o.   MRN: [id number]  Suboptimal control of hypertension.  Appointment scheduled for reassessment and adjustment of medical therapy.

## 2011-04-22 ENCOUNTER — Encounter: Payer: Self-pay | Admitting: Cardiology

## 2011-04-22 ENCOUNTER — Encounter: Payer: Self-pay | Admitting: *Deleted

## 2011-04-22 ENCOUNTER — Ambulatory Visit (INDEPENDENT_AMBULATORY_CARE_PROVIDER_SITE_OTHER): Payer: Medicare Other | Admitting: Cardiology

## 2011-04-22 VITALS — BP 166/97 | HR 65 | Resp 18 | Ht 72.0 in | Wt 200.0 lb

## 2011-04-22 DIAGNOSIS — I2510 Atherosclerotic heart disease of native coronary artery without angina pectoris: Secondary | ICD-10-CM

## 2011-04-22 DIAGNOSIS — N200 Calculus of kidney: Secondary | ICD-10-CM

## 2011-04-22 DIAGNOSIS — I4891 Unspecified atrial fibrillation: Secondary | ICD-10-CM

## 2011-04-22 DIAGNOSIS — I739 Peripheral vascular disease, unspecified: Secondary | ICD-10-CM

## 2011-04-22 MED ORDER — LISINOPRIL-HYDROCHLOROTHIAZIDE 20-12.5 MG PO TABS: 1.0000 | ORAL_TABLET | Freq: Every day | ORAL | Status: DC

## 2011-04-22 NOTE — Assessment & Plan Note (Signed)
Blood pressure control is suboptimal.  Lisinopril will be changed to lisinopril HCT with subsequent dose adjustments until blood pressure is optimal.  Patient will monitor BP at home and return in one month for a cardiology nurse visit and a repeat chemistry profile.

## 2011-04-22 NOTE — Patient Instructions (Signed)
Your physician recommends that you schedule a follow-up appointment in: Blood Pressure Check in 1 month  Your physician recommends that you return for lab work in: 1 month  Your physician has requested that you regularly monitor and record your blood pressure readings at home. Please use the same machine at the same time of day to check your readings and record them to bring to your follow-up visit.  Your physician has recommended you make the following change in your medication:  STOP Lisinopril  START Lisinopril/HCT 20/12.5 mg daily

## 2011-04-22 NOTE — Progress Notes (Signed)
Patient ID: [name] [name], male   DOB: July 10, 1950, 60 y.o.   MRN: [id number]  HPI: Return visit arranged by the office staff after patient and research nurse reported elevated blood pressures.  Systolics have been between 140 and 0000000 and diastolics 123456 mmHg.  Patient has done well symptomatically with class II dyspnea on exertion, which is chronic, but no chest discomfort, palpitations, lightheadedness or syncope.  Prior to Admission medications   Medication Sig Start Date End Date Taking? Authorizing Provider  carvedilol (COREG) 25 MG tablet Take 1 tablet (25 mg total) by mouth 2 (two) times daily with a meal. 04/07/11 04/06/12 Yes Ermalinda Barrios, PA  Multiple Vitamins-Minerals (MULTIVITAMIN WITH MINERALS) tablet Take 1 tablet by mouth daily.     Yes Historical Provider, MD  nitroGLYCERIN (NITROSTAT) 0.4 MG SL tablet Place 0.4 mg under the tongue every 5 (five) minutes as needed.     Yes Historical Provider, MD  NON FORMULARY Study Drug    Yes Historical Provider, MD  PRINIVIL 20 MG tablet TAKE 2 TABLETS BY MOUTH ONCE DAILY. 01/05/11  Yes Cristopher Estimable. Seleni Meller, MD  warfarin (COUMADIN) 5 MG tablet Take 1 tablet (5 mg total) by mouth as directed. By coumadin clinic  08/31/10  Yes Jory Sims, NP  No Known Allergies    Past medical history, social history, and family history reviewed and updated.  ROS: See history of present illness.  PHYSICAL EXAM: BP 166/97  Pulse 65  Resp 18  Ht 6' (1.829 m)  Wt 90.719 kg (200 lb)  BMI 27.12 kg/m2; weight increased 4 pounds since last visit General-Well developed; no acute distress Body habitus-mildly overweight Neck-No JVD; no carotid bruits Lungs-clear lung fields; resonant to percussion Cardiovascular-normal PMI; normal S1 and S2; S4 present Abdomen-normal bowel sounds; soft and non-tender without masses or organomegaly Musculoskeletal-No deformities, no cyanosis or clubbing Neurologic-Normal cranial nerves; symmetric strength and tone Skin-Warm,  no significant lesions Extremities-distal pulses intact except for 1+ left dorsalis pedis; no edema  ASSESSMENT AND PLAN:  Jacqulyn Ducking, MD 04/22/2011 3:34 PM

## 2011-04-23 ENCOUNTER — Encounter: Payer: Self-pay | Admitting: Cardiology

## 2011-05-06 ENCOUNTER — Other Ambulatory Visit: Payer: Self-pay | Admitting: Cardiology

## 2011-05-06 MED ORDER — CARVEDILOL 25 MG PO TABS: 25.0000 mg | ORAL_TABLET | Freq: Two times a day (BID) | ORAL | Status: DC

## 2011-05-06 NOTE — Telephone Encounter (Signed)
Needs RX for Carvedilol sent to [facility] in [place] / tg

## 2011-05-10 ENCOUNTER — Ambulatory Visit (INDEPENDENT_AMBULATORY_CARE_PROVIDER_SITE_OTHER): Payer: Medicare Other | Admitting: *Deleted

## 2011-05-10 DIAGNOSIS — Z7901 Long term (current) use of anticoagulants: Secondary | ICD-10-CM

## 2011-05-10 DIAGNOSIS — I4891 Unspecified atrial fibrillation: Secondary | ICD-10-CM

## 2011-05-10 LAB — POCT INR: INR: 2.7

## 2011-05-10 MED ORDER — CARVEDILOL 25 MG PO TABS: 25.0000 mg | ORAL_TABLET | Freq: Two times a day (BID) | ORAL | Status: AC

## 2011-05-20 ENCOUNTER — Encounter: Payer: Self-pay | Admitting: Family Medicine

## 2011-05-20 ENCOUNTER — Ambulatory Visit (INDEPENDENT_AMBULATORY_CARE_PROVIDER_SITE_OTHER): Payer: Medicare Other | Admitting: Family Medicine

## 2011-05-20 VITALS — BP 120/70 | HR 82 | Resp 18 | Ht 67.75 in | Wt 203.1 lb

## 2011-05-20 DIAGNOSIS — Z800 Family history of malignant neoplasm of digestive organs: Secondary | ICD-10-CM | POA: Insufficient documentation

## 2011-05-20 DIAGNOSIS — I4891 Unspecified atrial fibrillation: Secondary | ICD-10-CM

## 2011-05-20 DIAGNOSIS — Z23 Encounter for immunization: Secondary | ICD-10-CM

## 2011-05-20 DIAGNOSIS — C6290 Malignant neoplasm of unspecified testis, unspecified whether descended or undescended: Secondary | ICD-10-CM

## 2011-05-20 DIAGNOSIS — E785 Hyperlipidemia, unspecified: Secondary | ICD-10-CM

## 2011-05-20 DIAGNOSIS — Z1211 Encounter for screening for malignant neoplasm of colon: Secondary | ICD-10-CM

## 2011-05-20 DIAGNOSIS — I10 Essential (primary) hypertension: Secondary | ICD-10-CM

## 2011-05-20 MED ORDER — TETANUS-DIPHTH-ACELL PERTUSSIS 5-2.5-18.5 LF-MCG/0.5 IM SUSP: 0.5000 mL | Freq: Once | INTRAMUSCULAR | Status: DC

## 2011-05-20 NOTE — Patient Instructions (Signed)
Please get your labs done- do not eat after midnight  Continue your current medications We will set you up for a colonoscopy Today you received your tetanus vaccine  F/U 6 weeks

## 2011-05-20 NOTE — Progress Notes (Signed)
  Subjective:    Patient ID: [name] [name], male    DOB: 1950-11-22, 61 y.o.   MRN: [id number]  HPI Pt here to establish care, he would like to be sent for colonoscopy   A fib- coumadin clinic per cardiology, has palpitations occasionally  CAD- followed by Dr. Lattie Haw, currently has DES, no recent chest pain  Hyperlipidemia- study drug through [facility], has not had recent labs  HTN-tolerating meds, no concerns, no leg edema  Review of Systems   GEN- denies fatigue, fever, weight loss,weakness, recent illness HEENT- denies eye drainage, change in vision, nasal discharge, CVS- denies chest pain,+ palpitations RESP- denies SOB, cough, wheeze ABD- denies N/V, change in stools, abd pain GU- denies dysuria, hematuria, dribbling, incontinence MSK- denies joint pain, muscle aches, injury Neuro- denies headache, dizziness, syncope, seizure activity      Objective:   Physical Exam GEN- NAD, alert and oriented x3, pleasant HEENT- PERRL, EOMI, non injected sclera, pink conjunctiva, MMM, oropharynx clear Neck- Supple, no thyromegaly,no carotid bruit CVS- irregular rhythm, normal rate, no murmur RESP-CTAB EXT- No edema Pulses- Radial, DP- 2+ Psych- not depressed appearing       Assessment & Plan:

## 2011-05-22 LAB — CBC
HCT: 47 % (ref 39.0–52.0)
Hemoglobin: 16.2 g/dL (ref 13.0–17.0)
MCH: 30.9 pg (ref 26.0–34.0)
MCHC: 34.5 g/dL (ref 30.0–36.0)
MCV: 89.5 fL (ref 78.0–100.0)
Platelets: 165 10*3/uL (ref 150–400)
RBC: 5.25 MIL/uL (ref 4.22–5.81)
RDW: 14 % (ref 11.5–15.5)
WBC: 7.1 10*3/uL (ref 4.0–10.5)

## 2011-05-22 LAB — COMPREHENSIVE METABOLIC PANEL
ALT: 22 U/L (ref 0–53)
AST: 18 U/L (ref 0–37)
Albumin: 4.1 g/dL (ref 3.5–5.2)
Alkaline Phosphatase: 84 U/L (ref 39–117)
BUN: 31 mg/dL — ABNORMAL HIGH (ref 6–23)
CO2: 28 mEq/L (ref 19–32)
Calcium: 9.2 mg/dL (ref 8.4–10.5)
Chloride: 104 mEq/L (ref 96–112)
Creat: 1.7 mg/dL — ABNORMAL HIGH (ref 0.50–1.35)
Glucose, Bld: 96 mg/dL (ref 70–99)
Potassium: 5.1 mEq/L (ref 3.5–5.3)
Sodium: 137 mEq/L (ref 135–145)
Total Bilirubin: 0.7 mg/dL (ref 0.3–1.2)
Total Protein: 6.8 g/dL (ref 6.0–8.3)

## 2011-05-22 LAB — LIPID PANEL
Cholesterol: 126 mg/dL (ref 0–200)
HDL: 30 mg/dL — ABNORMAL LOW (ref >39)
LDL Cholesterol: 39 mg/dL (ref 0–99)
Total CHOL/HDL Ratio: 4.2 Ratio
Triglycerides: 285 mg/dL — ABNORMAL HIGH (ref <150)
VLDL: 57 mg/dL — ABNORMAL HIGH (ref 0–40)

## 2011-05-22 LAB — TSH: TSH: 0.492 u[IU]/mL (ref 0.350–4.500)

## 2011-05-23 NOTE — Assessment & Plan Note (Signed)
Referral to GI for Colonosopy- first degree relative with colon cancer

## 2011-05-23 NOTE — Assessment & Plan Note (Addendum)
Good control, continue current meds, BMET, TSH to be obtained

## 2011-05-23 NOTE — Assessment & Plan Note (Addendum)
He is in the It Trial per the notes, he unaware of his cholesterol labs- I will obtain a baseline I will need to get more information about his study and if there is anything they need from me as a new PCP

## 2011-05-23 NOTE — Assessment & Plan Note (Signed)
Remote history of testicular cancer,no current therapy

## 2011-05-23 NOTE — Assessment & Plan Note (Signed)
Followed by cardiology, currently asymptomatic, coumadin therapy

## 2011-05-26 ENCOUNTER — Ambulatory Visit (INDEPENDENT_AMBULATORY_CARE_PROVIDER_SITE_OTHER): Payer: Medicare Other

## 2011-05-26 VITALS — BP 154/100 | HR 70 | Ht 72.0 in | Wt 201.0 lb

## 2011-05-26 DIAGNOSIS — I10 Essential (primary) hypertension: Secondary | ICD-10-CM

## 2011-05-26 NOTE — Progress Notes (Signed)
S: Pt. arrives in office for a 1 month BP check. B: On last OV with Dr. Lattie Haw on 04/21/12 pt. was advised to stop taking Lisinopril and start taking Lisinopril/HCT 20/12.5 mg daily for sub optimal BP control.  A: Pt. has no complaints at this time. His BP this morning is 154/100 and at last OV his BP was 166/97. Pt. States he took his meds ths morning at 7:30am. He did bring his medications (he states he is taking as directed) and BP diary to visit (readings are similar to today's readings. R: Pt. advised to continue on current medical treatment and we will contact her with Dr. Izell [place] recommendations.

## 2011-05-28 ENCOUNTER — Encounter: Payer: Self-pay | Admitting: Cardiology

## 2011-05-28 NOTE — Progress Notes (Addendum)
Patient ID: [name] [name], male   DOB: 08-24-1950, 61 y.o.   MRN: [id number] BPs reviewed; of 13 measurements [id number], systolics generally 0000000 and diastolics [id number] Discontinue lisinopril/HCT and resume the Prinivil 20 mg per day Amlodipine 5 mg per day Blood pressure check in one month; home BPs if possible.

## 2011-06-01 ENCOUNTER — Telehealth: Payer: Self-pay | Admitting: *Deleted

## 2011-06-01 ENCOUNTER — Ambulatory Visit (INDEPENDENT_AMBULATORY_CARE_PROVIDER_SITE_OTHER): Payer: Medicare Other | Admitting: Gastroenterology

## 2011-06-01 ENCOUNTER — Encounter: Payer: Self-pay | Admitting: Gastroenterology

## 2011-06-01 DIAGNOSIS — R1310 Dysphagia, unspecified: Secondary | ICD-10-CM

## 2011-06-01 DIAGNOSIS — Z800 Family history of malignant neoplasm of digestive organs: Secondary | ICD-10-CM

## 2011-06-01 DIAGNOSIS — Z7901 Long term (current) use of anticoagulants: Secondary | ICD-10-CM

## 2011-06-01 DIAGNOSIS — I4891 Unspecified atrial fibrillation: Secondary | ICD-10-CM

## 2011-06-01 DIAGNOSIS — R1319 Other dysphagia: Secondary | ICD-10-CM | POA: Insufficient documentation

## 2011-06-01 DIAGNOSIS — R1314 Dysphagia, pharyngoesophageal phase: Secondary | ICD-10-CM

## 2011-06-01 DIAGNOSIS — K219 Gastro-esophageal reflux disease without esophagitis: Secondary | ICD-10-CM

## 2011-06-01 MED ORDER — LISINOPRIL 20 MG PO TABS: 20.0000 mg | ORAL_TABLET | Freq: Every day | ORAL | Status: DC

## 2011-06-01 MED ORDER — AMLODIPINE BESYLATE 5 MG PO TABS: 5.0000 mg | ORAL_TABLET | Freq: Every day | ORAL | Status: DC

## 2011-06-01 NOTE — Telephone Encounter (Signed)
Risk of thromboembolism is low and acceptable with discontinuation of warfarin for 4 days as planned.

## 2011-06-01 NOTE — Telephone Encounter (Signed)
Message left for patient to call back for new medication instructions per Dr Lattie Haw

## 2011-06-01 NOTE — Telephone Encounter (Signed)
New medication instructions given to wife.  Verbalizes understanding and 1 month BP check scheduled.

## 2011-06-01 NOTE — Assessment & Plan Note (Signed)
Patient with family history of colon cancer. His father was diagnosed in his early 87s. Patient has never had a colonoscopy. No significant problems outside of occasional constipation. He is on chronic Coumadin therapy with history of atrial fibrillation, previous coronary artery stents back in 2008. Recommend colonoscopy in the near future. We will touch base with Coumadin clinic to verify that it is okay to hold his Coumadin for 4 days prior procedure.  I have discussed the risks, alternatives, benefits with regards to but not limited to the risk of reaction to medication, bleeding, infection, perforation and the patient is agreeable to proceed. Written consent to be obtained.

## 2011-06-01 NOTE — Progress Notes (Signed)
Faxed to PCP

## 2011-06-01 NOTE — Patient Instructions (Signed)
We have scheduled you for a colonoscopy and upper endoscopy. Please see separate instructions. We will contact the Coumadin clinic and let them know that we have stopped your Coumadin for a few days before your procedure. If any further instructions needed, we will give you a call.

## 2011-06-01 NOTE — Progress Notes (Signed)
Fax sent to Dr. Lattie Haw in reference to holding coumadin x 4 days prior to procedures.

## 2011-06-01 NOTE — Telephone Encounter (Signed)
Received a fax from [name] [name] stating that Mr [name] [name] is scheduled to have a colon and endo performed and is requesting that coumadin be held 4 days prior to procedures.  Was last seen in the office in December.  Please advise.

## 2011-06-01 NOTE — Progress Notes (Signed)
Primary Care Physician:  Vic Blackbird, MD, MD  Primary Gastroenterologist:  Garfield Cornea, MD   Chief Complaint  Patient presents with  . Colonoscopy    HPI:  [name] [name] is a 61 y.o. male here to schedule first time ever colonoscopy. First degree relative with colon cancer at age 50, father. Patient states he occasionally has constipation. Denies rectal bleeding, melena. Some pp abd pain in the lower abdomen. Occasional heartburn, takes TUMS. Symptoms may occur couple times per week. Persisting over 5 years. Complains of trouble swallowing breads and meats but thinks is because he does not chew his food thoroughly. He does not have upper teeth. He has never had an upper endoscopy or colonoscopy.    Current Outpatient Prescriptions  Medication Sig Dispense Refill  . carvedilol (COREG) 25 MG tablet Take 1 tablet (25 mg total) by mouth 2 (two) times daily with a meal.  180 tablet  3  . lisinopril (PRINIVIL,ZESTRIL) 20 MG tablet Take 20 mg by mouth daily.       . Multiple Vitamins-Minerals (MULTIVITAMIN WITH MINERALS) tablet Take 1 tablet by mouth daily.        . nitroGLYCERIN (NITROSTAT) 0.4 MG SL tablet Place 0.4 mg under the tongue every 5 (five) minutes as needed.        . NON FORMULARY Study Drug cholesterol      . warfarin (COUMADIN) 5 MG tablet Take 1 tablet (5 mg total) by mouth as directed. By coumadin clinic   60 tablet  3   Current Facility-Administered Medications  Medication Dose Route Frequency Provider Last Rate Last Dose  . TDaP (BOOSTRIX) injection 0.5 mL  0.5 mL Intramuscular Once Vic Blackbird, MD        Allergies as of 06/01/2011  . (No Known Allergies)    Past Medical History  Diagnosis Date  . Arteriosclerotic cardiovascular disease (ASCVD)     AMI in 2000 treated at [facility]; cath in 12/2006->  Chronic total obstruction of the RCA;  drug-eluting stent placed in M1; inferior hypokinesis with an EF of 45%  . Tobacco abuse, in remission     20 pack years;  quit in 2009  . Hypertension   . Chronic anticoagulation   . Atrial fibrillation     on coumadin for couple of years, stopped plavix at that time  . Testicular carcinoma 1990    left orchiectomy  . PVD (peripheral vascular disease)     Ct angiogram in 2009 revealed stable disease with 80% celiac stenosis,50% right renal artery ,ASCVD with ulceration in the abdominal aortashe  . Nephrolithiasis   . Cerebrovascular disease 2002    carotid stent  . Hyperlipidemia     Past Surgical History  Procedure Date  . Appendectomy 2004  . Testicular cancer 1990    left orchiectomy    Family History  Problem Relation Age of Onset  . Colon cancer Father 27    deceased  . Liver disease Neg Hx   . Prostate cancer Father     History   Social History  . Marital Status: Married    Spouse Name: N/A    Number of Children: 2  . Years of Education: N/A   Occupational History  . disabled    Social History Main Topics  . Smoking status: Former Research scientist (life sciences)  . Smokeless tobacco: Never Used  . Alcohol Use: No  . Drug Use: No  . Sexually Active:    Other Topics Concern  . Not on file  Social History Narrative  . No narrative on file      ROS:  General: Negative for anorexia, weight loss, fever, chills, fatigue, weakness. Eyes: Negative for vision changes.  ENT: Negative for hoarseness, difficulty swallowing , nasal congestion. CV: Negative for chest pain, angina, palpitations, dyspnea on exertion, peripheral edema.  Respiratory: Negative for dyspnea at rest, dyspnea on exertion, cough, sputum, wheezing.  GI: See history of present illness. GU:  Negative for dysuria, hematuria, urinary incontinence, urinary frequency, nocturnal urination.  MS: Negative for joint pain, low back pain.  Derm: Negative for rash or itching.  Neuro: Negative for weakness, abnormal sensation, seizure, frequent headaches, memory loss, confusion.  Psych: Negative for anxiety, depression, suicidal ideation,  hallucinations.  Endo: Negative for unusual weight change.  Heme: Negative for bruising or bleeding. Allergy: Negative for rash or hives.    Physical Examination:  BP 152/88  Pulse 87  Temp(Src) 98.2 F (36.8 C) (Temporal)  Ht 6' (1.829 m)  Wt 208 lb 9.6 oz (94.62 kg)  BMI 28.29 kg/m2   General: Well-nourished, well-developed in no acute distress.  Head: Normocephalic, atraumatic.   Eyes: Conjunctiva pink, no icterus. Mouth: Oropharyngeal mucosa moist and pink , no lesions erythema or exudate. Neck: Supple without thyromegaly, masses, or lymphadenopathy.  Lungs: Clear to auscultation bilaterally.  Heart: Regular rate and rhythm, no murmurs rubs or gallops.  Abdomen: Bowel sounds are normal, nontender, nondistended, no hepatosplenomegaly or masses, no abdominal bruits or    hernia , no rebound or guarding.   Rectal: deferred to time of colonoscopy Extremities: No lower extremity edema. No clubbing or deformities.  Neuro: Alert and oriented x 4 , grossly normal neurologically.  Skin: Warm and dry, no rash or jaundice.   Psych: Alert and cooperative, normal mood and affect.  Labs: Lab Results  Component Value Date   WBC 7.1 05/20/2011   HGB 16.2 05/20/2011   HCT 47.0 05/20/2011   MCV 89.5 05/20/2011   PLT 165 05/20/2011   Lab Results  Component Value Date   CREATININE 1.70* 05/20/2011   BUN 31* 05/20/2011   NA 137 05/20/2011   K 5.1 05/20/2011   CL 104 05/20/2011   CO2 28 05/20/2011   Lab Results  Component Value Date   ALT 22 05/20/2011   AST 18 05/20/2011   ALKPHOS 84 05/20/2011   BILITOT 0.7 05/20/2011   Lab Results  Component Value Date   TSH 0.492 05/20/2011   Lab Results  Component Value Date   INR 2.7 05/10/2011   INR 2.5 03/29/2011   INR 2.0 02/25/2011     Imaging Studies: No results found.

## 2011-06-01 NOTE — Telephone Encounter (Signed)
Advised wife of Prinivil refill 20 mg daily

## 2011-06-01 NOTE — Assessment & Plan Note (Signed)
Chronic intermittent GERD for years, solid food esophageal dysphagia. Recommend EGD with dilation in the near future. Rule out Barrett's esophagus. Plan hold Coumadin for 4 days prior to procedure. We'll touch base with the Coumadin clinic to verify that this is okay.  I have discussed the risks, alternatives, benefits with regards to but not limited to the risk of reaction to medication, bleeding, infection, perforation and the patient is agreeable to proceed. Written consent to be obtained.

## 2011-06-02 ENCOUNTER — Encounter: Payer: Self-pay | Admitting: *Deleted

## 2011-06-02 NOTE — Progress Notes (Signed)
Received letter from cardiologist's office. OK to hold coumadin.

## 2011-06-03 ENCOUNTER — Other Ambulatory Visit: Payer: Self-pay | Admitting: Gastroenterology

## 2011-06-03 MED ORDER — PEG-KCL-NACL-NASULF-NA ASC-C 100 G PO SOLR: 1.0000 | Freq: Once | ORAL | Status: DC

## 2011-06-07 ENCOUNTER — Encounter (HOSPITAL_COMMUNITY): Payer: Self-pay | Admitting: Pharmacy Technician

## 2011-06-16 MED ORDER — SODIUM CHLORIDE 0.45 % IV SOLN
Freq: Once | INTRAVENOUS | Status: AC
  Administered 2011-06-17: 09:00:00 via INTRAVENOUS

## 2011-06-17 ENCOUNTER — Encounter (HOSPITAL_COMMUNITY)
Admission: RE | Disposition: A | Discharge status: Home / Self Care | Payer: Self-pay | Source: Ambulatory Visit | Attending: Internal Medicine

## 2011-06-17 ENCOUNTER — Ambulatory Visit (HOSPITAL_COMMUNITY)
Admission: RE | Admit: 2011-06-17 | Discharge: 2011-06-17 | Disposition: A | Discharge status: Home / Self Care | Payer: Medicare Other | Source: Ambulatory Visit | Attending: Internal Medicine | Admitting: Internal Medicine

## 2011-06-17 ENCOUNTER — Encounter (HOSPITAL_COMMUNITY): Payer: Self-pay

## 2011-06-17 DIAGNOSIS — Z538 Procedure and treatment not carried out for other reasons: Secondary | ICD-10-CM | POA: Insufficient documentation

## 2011-06-17 DIAGNOSIS — K222 Esophageal obstruction: Secondary | ICD-10-CM | POA: Insufficient documentation

## 2011-06-17 DIAGNOSIS — Z800 Family history of malignant neoplasm of digestive organs: Secondary | ICD-10-CM | POA: Insufficient documentation

## 2011-06-17 DIAGNOSIS — Z1211 Encounter for screening for malignant neoplasm of colon: Secondary | ICD-10-CM | POA: Insufficient documentation

## 2011-06-17 DIAGNOSIS — K2960 Other gastritis without bleeding: Secondary | ICD-10-CM

## 2011-06-17 DIAGNOSIS — R1310 Dysphagia, unspecified: Secondary | ICD-10-CM | POA: Insufficient documentation

## 2011-06-17 DIAGNOSIS — I4891 Unspecified atrial fibrillation: Secondary | ICD-10-CM | POA: Insufficient documentation

## 2011-06-17 DIAGNOSIS — K2100 Gastro-esophageal reflux disease with esophagitis, without bleeding: Secondary | ICD-10-CM

## 2011-06-17 DIAGNOSIS — E785 Hyperlipidemia, unspecified: Secondary | ICD-10-CM | POA: Insufficient documentation

## 2011-06-17 DIAGNOSIS — Z79899 Other long term (current) drug therapy: Secondary | ICD-10-CM | POA: Insufficient documentation

## 2011-06-17 DIAGNOSIS — Z86010 Personal history of colonic polyps: Secondary | ICD-10-CM

## 2011-06-17 DIAGNOSIS — Z7901 Long term (current) use of anticoagulants: Secondary | ICD-10-CM | POA: Insufficient documentation

## 2011-06-17 DIAGNOSIS — I10 Essential (primary) hypertension: Secondary | ICD-10-CM | POA: Insufficient documentation

## 2011-06-17 HISTORY — DX: Acute myocardial infarction, unspecified: I21.9

## 2011-06-17 HISTORY — PX: COLONOSCOPY: SHX5424

## 2011-06-17 HISTORY — PX: ESOPHAGOGASTRODUODENOSCOPY: SHX1529

## 2011-06-17 SURGERY — COLONOSCOPY
Anesthesia: Moderate Sedation

## 2011-06-17 MED ORDER — PANTOPRAZOLE SODIUM 40 MG PO TBEC
40.0000 mg | DELAYED_RELEASE_TABLET | Freq: Every day | ORAL | Status: DC
  Filled 2011-06-17 (×2): qty 1

## 2011-06-17 MED ORDER — STERILE WATER FOR IRRIGATION IR SOLN
Status: DC | PRN
  Administered 2011-06-17: 09:00:00

## 2011-06-17 MED ORDER — MIDAZOLAM HCL 5 MG/5ML IJ SOLN
INTRAMUSCULAR | Status: AC
  Filled 2011-06-17: qty 10

## 2011-06-17 MED ORDER — MIDAZOLAM HCL 5 MG/5ML IJ SOLN
INTRAMUSCULAR | Status: DC | PRN
  Administered 2011-06-17 (×2): 2 mg via INTRAVENOUS

## 2011-06-17 MED ORDER — MEPERIDINE HCL 100 MG/ML IJ SOLN
INTRAMUSCULAR | Status: DC | PRN
  Administered 2011-06-17: 25 mg via INTRAVENOUS
  Administered 2011-06-17: 50 mg via INTRAVENOUS

## 2011-06-17 MED ORDER — BUTAMBEN-TETRACAINE-BENZOCAINE 2-2-14 % EX AERO
INHALATION_SPRAY | CUTANEOUS | Status: DC | PRN
  Administered 2011-06-17: 2 via TOPICAL

## 2011-06-17 MED ORDER — MEPERIDINE HCL 100 MG/ML IJ SOLN
INTRAMUSCULAR | Status: AC
  Filled 2011-06-17: qty 1

## 2011-06-17 NOTE — Op Note (Signed)
[facility] [address] [place], [place]  [address]  COLONOSCOPY PROCEDURE REPORT  PATIENT:  [name], [name]  MR#:  [id number] BIRTHDATE:  12-Mar-1951, 65 yrs. old  GENDER:  male ENDOSCOPIST:  R. Garfield Cornea, MD FACP [facility] REF. BY:          Dr. Buelah Manis PROCEDURE DATE:  06/17/2011 PROCEDURE:  Incomplete colonoscopy  INDICATIONS:  First ever screening examination. Positive family history of colon cancer.  INFORMED CONSENT:  The risks, benefits, alternatives and imponderables including but not limited to bleeding, perforation as well as the possibility of a missed lesion have been reviewed. The potential for biopsy, lesion removal, etc. have also been discussed.  Questions have been answered.  All parties agreeable. Please see the history and physical in the medical record for more information.  MEDICATIONS:  Versed 4 mg IV Demerol 75 mg IV in divided doses.  DESCRIPTION OF PROCEDURE:  After a digital rectal exam was performed, the [id number]) colonoscope was advanced from the anus well into the sigmoid colon.    <<PROCEDUREIMAGES>>  FINDINGS:  The prep was poor and inadequate. It was advanced well into the sigmoid colon where there was viscous liquid and formed stool elements which appear to be more prominent as the more proximal left colon was reached. It became evident  the prep was inadequate; exam could not be carried out to completion today.  THERAPEUTIC / DIAGNOSTIC MANEUVERS PERFORMED:  None  COMPLICATIONS:  None  CECAL WITHDRAWAL TIME:  Not applicable  IMPRESSION:   Incomplete colonoscopy because of inadequate preparation. Noncompliance with preparation appears to have been an issue.  RECOMMENDATIONS:  Resume Coumadin today. Will have patient return to the office to regroup and reschedule another attempt at complete colonoscopy.  ______________________________ R. Garfield Cornea, MD [name]  CC:  n. eSIGNED:   R. Garfield Cornea at  06/17/2011 10:04 AM  [name], [name], [id number]

## 2011-06-17 NOTE — Interval H&P Note (Signed)
History and Physical Interval Note:  06/17/2011 9:19 AM  [name] [name]  has presented today for surgery, with the diagnosis of dysphagia, family hx of CRC  The various methods of treatment have been discussed with the patient and family. After consideration of risks, benefits and other options for treatment, the patient has consented to  Procedure(s) (LRB): COLONOSCOPY (N/A) ESOPHAGOGASTRODUODENOSCOPY (EGD) WITH ESOPHAGEAL DILATION (N/A) as a surgical intervention .  The patients' history has been reviewed, patient examined, no change in status, stable for surgery.  I have reviewed the patients' chart and labs.  Questions were answered to the patient's satisfaction.     Manus Rudd

## 2011-06-17 NOTE — H&P (View-Only) (Signed)
Primary Care Physician:  Vic Blackbird, MD, MD  Primary Gastroenterologist:  Garfield Cornea, MD   Chief Complaint  Patient presents with  . Colonoscopy    HPI:  [name] [name] is a 61 y.o. male here to schedule first time ever colonoscopy. First degree relative with colon cancer at age 67, father. Patient states he occasionally has constipation. Denies rectal bleeding, melena. Some pp abd pain in the lower abdomen. Occasional heartburn, takes TUMS. Symptoms may occur couple times per week. Persisting over 5 years. Complains of trouble swallowing breads and meats but thinks is because he does not chew his food thoroughly. He does not have upper teeth. He has never had an upper endoscopy or colonoscopy.    Current Outpatient Prescriptions  Medication Sig Dispense Refill  . carvedilol (COREG) 25 MG tablet Take 1 tablet (25 mg total) by mouth 2 (two) times daily with a meal.  180 tablet  3  . lisinopril (PRINIVIL,ZESTRIL) 20 MG tablet Take 20 mg by mouth daily.       . Multiple Vitamins-Minerals (MULTIVITAMIN WITH MINERALS) tablet Take 1 tablet by mouth daily.        . nitroGLYCERIN (NITROSTAT) 0.4 MG SL tablet Place 0.4 mg under the tongue every 5 (five) minutes as needed.        . NON FORMULARY Study Drug cholesterol      . warfarin (COUMADIN) 5 MG tablet Take 1 tablet (5 mg total) by mouth as directed. By coumadin clinic   60 tablet  3   Current Facility-Administered Medications  Medication Dose Route Frequency Provider Last Rate Last Dose  . TDaP (BOOSTRIX) injection 0.5 mL  0.5 mL Intramuscular Once Vic Blackbird, MD        Allergies as of 06/01/2011  . (No Known Allergies)    Past Medical History  Diagnosis Date  . Arteriosclerotic cardiovascular disease (ASCVD)     AMI in 2000 treated at [facility]; cath in 12/2006->  Chronic total obstruction of the RCA;  drug-eluting stent placed in M1; inferior hypokinesis with an EF of 45%  . Tobacco abuse, in remission     20 pack years;  quit in 2009  . Hypertension   . Chronic anticoagulation   . Atrial fibrillation     on coumadin for couple of years, stopped plavix at that time  . Testicular carcinoma 1990    left orchiectomy  . PVD (peripheral vascular disease)     Ct angiogram in 2009 revealed stable disease with 80% celiac stenosis,50% right renal artery ,ASCVD with ulceration in the abdominal aortashe  . Nephrolithiasis   . Cerebrovascular disease 2002    carotid stent  . Hyperlipidemia     Past Surgical History  Procedure Date  . Appendectomy 2004  . Testicular cancer 1990    left orchiectomy    Family History  Problem Relation Age of Onset  . Colon cancer Father 15    deceased  . Liver disease Neg Hx   . Prostate cancer Father     History   Social History  . Marital Status: Married    Spouse Name: N/A    Number of Children: 2  . Years of Education: N/A   Occupational History  . disabled    Social History Main Topics  . Smoking status: Former Research scientist (life sciences)  . Smokeless tobacco: Never Used  . Alcohol Use: No  . Drug Use: No  . Sexually Active:    Other Topics Concern  . Not on file  Social History Narrative  . No narrative on file      ROS:  General: Negative for anorexia, weight loss, fever, chills, fatigue, weakness. Eyes: Negative for vision changes.  ENT: Negative for hoarseness, difficulty swallowing , nasal congestion. CV: Negative for chest pain, angina, palpitations, dyspnea on exertion, peripheral edema.  Respiratory: Negative for dyspnea at rest, dyspnea on exertion, cough, sputum, wheezing.  GI: See history of present illness. GU:  Negative for dysuria, hematuria, urinary incontinence, urinary frequency, nocturnal urination.  MS: Negative for joint pain, low back pain.  Derm: Negative for rash or itching.  Neuro: Negative for weakness, abnormal sensation, seizure, frequent headaches, memory loss, confusion.  Psych: Negative for anxiety, depression, suicidal ideation,  hallucinations.  Endo: Negative for unusual weight change.  Heme: Negative for bruising or bleeding. Allergy: Negative for rash or hives.    Physical Examination:  BP 152/88  Pulse 87  Temp(Src) 98.2 F (36.8 C) (Temporal)  Ht 6' (1.829 m)  Wt 208 lb 9.6 oz (94.62 kg)  BMI 28.29 kg/m2   General: Well-nourished, well-developed in no acute distress.  Head: Normocephalic, atraumatic.   Eyes: Conjunctiva pink, no icterus. Mouth: Oropharyngeal mucosa moist and pink , no lesions erythema or exudate. Neck: Supple without thyromegaly, masses, or lymphadenopathy.  Lungs: Clear to auscultation bilaterally.  Heart: Regular rate and rhythm, no murmurs rubs or gallops.  Abdomen: Bowel sounds are normal, nontender, nondistended, no hepatosplenomegaly or masses, no abdominal bruits or    hernia , no rebound or guarding.   Rectal: deferred to time of colonoscopy Extremities: No lower extremity edema. No clubbing or deformities.  Neuro: Alert and oriented x 4 , grossly normal neurologically.  Skin: Warm and dry, no rash or jaundice.   Psych: Alert and cooperative, normal mood and affect.  Labs: Lab Results  Component Value Date   WBC 7.1 05/20/2011   HGB 16.2 05/20/2011   HCT 47.0 05/20/2011   MCV 89.5 05/20/2011   PLT 165 05/20/2011   Lab Results  Component Value Date   CREATININE 1.70* 05/20/2011   BUN 31* 05/20/2011   NA 137 05/20/2011   K 5.1 05/20/2011   CL 104 05/20/2011   CO2 28 05/20/2011   Lab Results  Component Value Date   ALT 22 05/20/2011   AST 18 05/20/2011   ALKPHOS 84 05/20/2011   BILITOT 0.7 05/20/2011   Lab Results  Component Value Date   TSH 0.492 05/20/2011   Lab Results  Component Value Date   INR 2.7 05/10/2011   INR 2.5 03/29/2011   INR 2.0 02/25/2011     Imaging Studies: No results found.

## 2011-06-17 NOTE — Op Note (Signed)
[facility] [address] [place],   [address]  ENDOSCOPY PROCEDURE REPORT  PATIENT:  [name] [name], [name] [name]  MR#:  [id number] BIRTHDATE:  17-Dec-1950, 78 yrs. old  GENDER:  male  ENDOSCOPIST:  R. Garfield Cornea, MD FACP [facility] Referred by:           Dr. Waynette Buttery  PROCEDURE DATE:  06/17/2011 PROCEDURE:  EGD with Venia Minks dilation  INDICATIONS:   esophageal dysphagia in the setting of long-standing GERD  INFORMED CONSENT:   The risks, benefits, limitations, alternatives and imponderables have been discussed.  The potential for biopsy, esophogeal dilation, etc. have also been reviewed.  Questions have been answered.  All parties agreeable.  Please see the history and physical in the medical record for more information.  MEDICATIONS:     Demerol 75 mg IV and Versed 4 mg IV in divided doses.  DESCRIPTION OF PROCEDURE:   The [id number]) endoscope was introduced through the mouth and advanced to the second portion of the duodenum without difficulty or limitations.  The mucosal surfaces were surveyed very carefully during advancement of the scope and upon withdrawal.  Retroflexion view of the proximal stomach and esophagogastric junction was performed.  <<PROCEDUREIMAGES>>  FINDINGS:  Extensive geographic erosion and ulceration of the distal 5 cm tubular esophagus. soft noncritical peptic stricture present.     No Barrett's esophagus. No tumor. Small hiatal hernia. Antral erosions. Small amount of retained gastric contents. First and     second portion of the duodenum appeared normal.  THERAPEUTIC / DIAGNOSTIC MANEUVERS PERFORMED:   67 French Maloney dilator passed easily. A look back revealed no apparent complications with this maneuver.  COMPLICATIONS:   None  IMPRESSION:             Rather severe erosive ulcerative reflux esophagitis was soft noncritical appearing stricture-status post dilation. Small hilar               hernia. Small amount of retained gastric  contents of   doubtful clinical significance. Antral erosions.  RECOMMENDATIONS:   Chronic PPI therapy. See colonoscopy report.  ______________________________ R. Garfield Cornea, MD [name]  CC:  n. eSIGNED:   R. Garfield Cornea at 06/17/2011 09:51 AM  Winer, [name], [id number]

## 2011-06-17 NOTE — Discharge Instructions (Signed)
Colonoscopy Discharge Instructions  Read the instructions outlined below and refer to this sheet in the next few weeks. These discharge instructions provide you with general information on caring for yourself after you leave the hospital. Your doctor may also give you specific instructions. While your treatment has been planned according to the most current medical practices available, unavoidable complications occasionally occur. If you have any problems or questions after discharge, call Dr. Gala Romney at [phone]. ACTIVITY  You may resume your regular activity, but move at a slower pace for the next 24 hours.   Take frequent rest periods for the next 24 hours.   Walking will help get rid of the air and reduce the bloated feeling in your belly (abdomen).   No driving for 24 hours (because of the medicine (anesthesia) used during the test).    Do not sign any important legal documents or operate any machinery for 24 hours (because of the anesthesia used during the test).  NUTRITION  Drink plenty of fluids.   You may resume your normal diet as instructed by your doctor.   Begin with a light meal and progress to your normal diet. Heavy or fried foods are harder to digest and may make you feel sick to your stomach (nauseated).   Avoid alcoholic beverages for 24 hours or as instructed.  MEDICATIONS  You may resume your normal medications unless your doctor tells you otherwise.  WHAT YOU CAN EXPECT TODAY  Some feelings of bloating in the abdomen.   Passage of more gas than usual.   Spotting of blood in your stool or on the toilet paper.  IF YOU HAD POLYPS REMOVED DURING THE COLONOSCOPY:  No aspirin products for 7 days or as instructed.   No alcohol for 7 days or as instructed.   Eat a soft diet for the next 24 hours.  FINDING OUT THE RESULTS OF YOUR TEST Not all test results are available during your visit. If your test results are not back during the visit, make an appointment  with your caregiver to find out the results. Do not assume everything is normal if you have not heard from your caregiver or the medical facility. It is important for you to follow up on all of your test results.  SEEK IMMEDIATE MEDICAL ATTENTION IF:  You have more than a spotting of blood in your stool.   Your belly is swollen (abdominal distention).   You are nauseated or vomiting.   You have a temperature over 101.  You have abdominal pain or discomfort that is severe or gets worse throughout the day. EGD Discharge instructions Please read the instructions outlined below and refer to this sheet in the next few weeks. These discharge instructions provide you with general information on caring for yourself after you leave the hospital. Your doctor may also give you specific instructions. While your treatment has been planned according to the most current medical practices available, unavoidable complications occasionally occur. If you have any problems or questions after discharge, please call your doctor. ACTIVITY You may resume your regular activity but move at a slower pace for the next 24 hours.  Take frequent rest periods for the next 24 hours.  Walking will help expel (get rid of) the air and reduce the bloated feeling in your abdomen.  No driving for 24 hours (because of the anesthesia (medicine) used during the test).  You may shower.  Do not sign any important legal documents or operate any machinery for 24  hours (because of the anesthesia used during the test).  NUTRITION Drink plenty of fluids.  You may resume your normal diet.  Begin with a light meal and progress to your normal diet.  Avoid alcoholic beverages for 24 hours or as instructed by your caregiver.  MEDICATIONS You may resume your normal medications unless your caregiver tells you otherwise.  WHAT YOU CAN EXPECT TODAY You may experience abdominal discomfort such as a feeling of fullness or "gas" pains.   FOLLOW-UP Your doctor will discuss the results of your test with you.  SEEK IMMEDIATE MEDICAL ATTENTION IF ANY OF THE FOLLOWING OCCUR: Excessive nausea (feeling sick to your stomach) and/or vomiting.  Severe abdominal pain and distention (swelling).  Trouble swallowing.  Temperature over 101 F (37.8 C).  Rectal bleeding or vomiting of blood.    GERD information provided.  Begin Protonix 40 mg daily  Resume Coumadin today.  Office visit with [place] the next 2-3 weeks to discuss rescheduling colonoscopy.    Gastroesophageal Reflux Disease, Adult Gastroesophageal reflux disease (GERD) happens when acid from your stomach flows up into the esophagus. When acid comes in contact with the esophagus, the acid causes soreness (inflammation) in the esophagus. Over time, GERD may create small holes (ulcers) in the lining of the esophagus. CAUSES   Increased body weight. This puts pressure on the stomach, making acid rise from the stomach into the esophagus.   Smoking. This increases acid production in the stomach.   Drinking alcohol. This causes decreased pressure in the lower esophageal sphincter (valve or ring of muscle between the esophagus and stomach), allowing acid from the stomach into the esophagus.   Late evening meals and a full stomach. This increases pressure and acid production in the stomach.   A malformed lower esophageal sphincter.  Sometimes, no cause is found. SYMPTOMS   Burning pain in the lower part of the mid-chest behind the breastbone and in the mid-stomach area. This may occur twice a week or more often.   Trouble swallowing.   Sore throat.   Dry cough.   Asthma-like symptoms including chest tightness, shortness of breath, or wheezing.  DIAGNOSIS  Your caregiver may be able to diagnose GERD based on your symptoms. In some cases, X-rays and other tests may be done to check for complications or to check the condition of your stomach and esophagus. TREATMENT   Your caregiver may recommend over-the-counter or prescription medicines to help decrease acid production. Ask your caregiver before starting or adding any new medicines.  HOME CARE INSTRUCTIONS   Change the factors that you can control. Ask your caregiver for guidance concerning weight loss, quitting smoking, and alcohol consumption.   Avoid foods and drinks that make your symptoms worse, such as:   Caffeine or alcoholic drinks.   Chocolate.   Peppermint or mint flavorings.   Garlic and onions.   Spicy foods.   Citrus fruits, such as oranges, lemons, or limes.   Tomato-based foods such as sauce, chili, salsa, and pizza.   Fried and fatty foods.   Avoid lying down for the 3 hours prior to your bedtime or prior to taking a nap.   Eat small, frequent meals instead of large meals.   Wear loose-fitting clothing. Do not wear anything tight around your waist that causes pressure on your stomach.   Raise the head of your bed 6 to 8 inches with wood blocks to help you sleep. Extra pillows will not help.   Only take over-the-counter or prescription  medicines for pain, discomfort, or fever as directed by your caregiver.   Do not take aspirin, ibuprofen, or other nonsteroidal anti-inflammatory drugs (NSAIDs).  SEEK IMMEDIATE MEDICAL CARE IF:   You have pain in your arms, neck, jaw, teeth, or back.   Your pain increases or changes in intensity or duration.   You develop nausea, vomiting, or sweating (diaphoresis).   You develop shortness of breath, or you faint.   Your vomit is green, yellow, black, or looks like coffee grounds or blood.   Your stool is red, bloody, or black.  These symptoms could be signs of other problems, such as heart disease, gastric bleeding, or esophageal bleeding. MAKE SURE YOU:   Understand these instructions.   Will watch your condition.   Will get help right away if you are not doing well or get worse.  Document Released: 01/27/2005 Document  Revised: 12/30/2010 Document Reviewed: 11/06/2010 [facility] Patient Information 2012 [place].

## 2011-06-17 NOTE — H&P (View-Only) (Signed)
Received letter from cardiologist's office. OK to hold coumadin.

## 2011-06-17 NOTE — Interval H&P Note (Signed)
History and Physical Interval Note:  06/17/2011 9:18 AM  [name] [name]  has presented today for surgery, with the diagnosis of dysphagia, family hx of CRC  The various methods of treatment have been discussed with the patient and family. After consideration of risks, benefits and other options for treatment, the patient has consented to  Procedure(s) (LRB): COLONOSCOPY (N/A) ESOPHAGOGASTRODUODENOSCOPY (EGD) WITH ESOPHAGEAL DILATION (N/A) as a surgical intervention .  The patients' history has been reviewed, patient examined, no change in status, stable for surgery.  I have reviewed the patients' chart and labs.  Questions were answered to the patient's satisfaction.     Manus Rudd

## 2011-06-21 ENCOUNTER — Encounter: Payer: Medicare Other | Admitting: *Deleted

## 2011-06-21 ENCOUNTER — Ambulatory Visit (INDEPENDENT_AMBULATORY_CARE_PROVIDER_SITE_OTHER): Payer: Medicare Other | Admitting: *Deleted

## 2011-06-21 ENCOUNTER — Encounter: Payer: Self-pay | Admitting: *Deleted

## 2011-06-21 VITALS — BP 136/75 | HR 85 | Ht 72.0 in | Wt 208.0 lb

## 2011-06-21 DIAGNOSIS — Z7901 Long term (current) use of anticoagulants: Secondary | ICD-10-CM

## 2011-06-21 DIAGNOSIS — I4891 Unspecified atrial fibrillation: Secondary | ICD-10-CM

## 2011-06-21 DIAGNOSIS — I10 Essential (primary) hypertension: Secondary | ICD-10-CM

## 2011-06-21 LAB — POCT INR: INR: 1.4

## 2011-06-21 NOTE — Progress Notes (Signed)
Patient presents for blood pressure check.  States taking medications as prescribed.  Home blood pressures have been sub optimal (see handwritten document from patient).  No complaints noted.

## 2011-06-24 ENCOUNTER — Encounter (HOSPITAL_COMMUNITY): Payer: Self-pay | Admitting: Internal Medicine

## 2011-06-25 NOTE — Progress Notes (Addendum)
Patient ID: [name] [name], male   DOB: June 24, 1950, 61 y.o.   MRN: [id number] BP 136/75  Pulse 85  Ht 6' (1.829 m)  Wt 94.348 kg (208 lb)  BMI 28.21 kg/m2  Blood pressure is good today but not always optimal at home.  Change lisinopril to lisinopril HCT 20/12.5 mg per day.  Home blood pressures. Blood pressure check in one month.

## 2011-06-28 ENCOUNTER — Encounter: Payer: Self-pay | Admitting: Cardiology

## 2011-06-29 ENCOUNTER — Telehealth: Payer: Self-pay | Admitting: *Deleted

## 2011-06-29 MED ORDER — LISINOPRIL-HYDROCHLOROTHIAZIDE 20-12.5 MG PO TABS: 1.0000 | ORAL_TABLET | Freq: Every day | ORAL | Status: DC

## 2011-06-29 NOTE — Progress Notes (Signed)
Patient and wife notified of medication changes and appointment given.  Verbalized understanding.

## 2011-06-30 ENCOUNTER — Encounter: Payer: Self-pay | Admitting: Cardiology

## 2011-07-01 ENCOUNTER — Encounter: Payer: Self-pay | Admitting: Family Medicine

## 2011-07-01 ENCOUNTER — Ambulatory Visit (INDEPENDENT_AMBULATORY_CARE_PROVIDER_SITE_OTHER): Payer: Medicare Other | Admitting: Family Medicine

## 2011-07-01 VITALS — BP 136/74 | HR 80 | Resp 18 | Ht 67.75 in | Wt 208.1 lb

## 2011-07-01 DIAGNOSIS — I10 Essential (primary) hypertension: Secondary | ICD-10-CM

## 2011-07-01 DIAGNOSIS — N289 Disorder of kidney and ureter, unspecified: Secondary | ICD-10-CM

## 2011-07-01 DIAGNOSIS — I4891 Unspecified atrial fibrillation: Secondary | ICD-10-CM

## 2011-07-01 DIAGNOSIS — J309 Allergic rhinitis, unspecified: Secondary | ICD-10-CM

## 2011-07-01 DIAGNOSIS — H547 Unspecified visual loss: Secondary | ICD-10-CM

## 2011-07-01 DIAGNOSIS — E785 Hyperlipidemia, unspecified: Secondary | ICD-10-CM

## 2011-07-01 DIAGNOSIS — J302 Other seasonal allergic rhinitis: Secondary | ICD-10-CM

## 2011-07-01 MED ORDER — FEXOFENADINE HCL 180 MG PO TABS: 180.0000 mg | ORAL_TABLET | Freq: Every day | ORAL | Status: DC

## 2011-07-01 NOTE — Assessment & Plan Note (Signed)
-   optho referral

## 2011-07-01 NOTE — Assessment & Plan Note (Signed)
BP looks good today even though pt has not started the HCTZ-Lisinopril combo change. He does have some RI, but will monitor this for now

## 2011-07-01 NOTE — Assessment & Plan Note (Signed)
No recent events, coumadin clinic following pt

## 2011-07-01 NOTE — Assessment & Plan Note (Signed)
Elevated lipids however pt in research trial

## 2011-07-01 NOTE — Patient Instructions (Signed)
I refer you to an eye doctor  Try the allegra for your allergies/watery eyes Continue your current medications Reschedule your colonoscopy F/U 3 months ( call ahead to get your kidney labs drawn)

## 2011-07-01 NOTE — Progress Notes (Signed)
  Subjective:    Patient ID: ALMAN NULL, male    DOB: 08/07/1950, 61 y.o.   MRN: [id number]  HPI A fib- seen by cardiology, no recent events, continues to follow in coumadin clinic HTN- home BP readings have been elevated with systolic's in [id number]. Pt seen by cardiology and HCTZ added to lisinopril Watery eyes- eyes watering for the past few weeks, similar to his allergies, no fever, no URI symptoms, was on allegra which helped GERD- seen by GI , s/p EGD which showed gastritis and esophagitis, started on protonix which has helped pt symptoms as well as dilatation. He needs f/u colonoscopy as bowel prep was not well done  Labs reviewed Review of Systems   GEN- denies fatigue, fever, weight loss,weakness, recent illness HEENT- +eye drainage, change in vision, nasal discharge, CVS- denies chest pain, palpitations, leg edema RESP- denies SOB, cough, wheeze ABD- denies N/V, change in stools, abd pain     Objective:   Physical Exam GEN- NAD, alert and oriented x3, pleasant HEENT- PERRL, EOMI, non injected sclera, pink conjunctiva, MMM, oropharynx clear, TM clear bialt, nares, clear, watering from bilateral eyes Neck- Supple,no LAD CVS- irregular rhythm, normal rate, no murmur RESP-CTAB EXT- No edema Pulses- Radial, DP- 2+        Assessment & Plan:

## 2011-07-01 NOTE — Assessment & Plan Note (Addendum)
Will monitor renal function with HCTZ and ACEI on board. Previous labs have been wnl

## 2011-07-01 NOTE — Assessment & Plan Note (Signed)
Restart allegra, if no improve consider pataday

## 2011-07-02 LAB — MICROALBUMIN / CREATININE URINE RATIO
Creatinine, Urine: 172.9 mg/dL
Microalb Creat Ratio: 5.5 mg/g (ref 0.0–30.0)
Microalb, Ur: 0.95 mg/dL (ref 0.00–1.89)

## 2011-07-05 ENCOUNTER — Ambulatory Visit (INDEPENDENT_AMBULATORY_CARE_PROVIDER_SITE_OTHER): Payer: Medicare Other | Admitting: Gastroenterology

## 2011-07-05 ENCOUNTER — Ambulatory Visit (INDEPENDENT_AMBULATORY_CARE_PROVIDER_SITE_OTHER): Payer: Medicare Other | Admitting: *Deleted

## 2011-07-05 ENCOUNTER — Encounter: Payer: Self-pay | Admitting: Gastroenterology

## 2011-07-05 DIAGNOSIS — I4891 Unspecified atrial fibrillation: Secondary | ICD-10-CM

## 2011-07-05 DIAGNOSIS — Z800 Family history of malignant neoplasm of digestive organs: Secondary | ICD-10-CM

## 2011-07-05 DIAGNOSIS — Z7901 Long term (current) use of anticoagulants: Secondary | ICD-10-CM

## 2011-07-05 DIAGNOSIS — K219 Gastro-esophageal reflux disease without esophagitis: Secondary | ICD-10-CM

## 2011-07-05 LAB — POCT INR: INR: 2.5

## 2011-07-05 MED ORDER — PEG-KCL-NACL-NASULF-NA ASC-C 100 G PO SOLR: 1.0000 | Freq: Once | ORAL | Status: DC

## 2011-07-05 NOTE — Assessment & Plan Note (Signed)
Second attempt for colonoscopy. Patient misunderstood bowel prep last time so colonoscopy was incomplete secondary to solid stool beyond sigmoid colon. Add dulcolax 10mg  po daily for three days before procedure. Will hold coumadin four days before procedure like last time, previously approved by cardiologist. Patient to call if any questions about prep. Full detailed verbal explanation given today along with handout.

## 2011-07-05 NOTE — Assessment & Plan Note (Signed)
Doing well now on pantoprazole. Very little breakthrough heartburn. Dysphagia gone s/p esophageal dilation.  OV in one year.

## 2011-07-05 NOTE — Progress Notes (Signed)
Primary Care Physician:  Vic Blackbird, MD, MD  Primary Gastroenterologist:  Garfield Cornea, MD   Chief Complaint  Patient presents with  . Follow-up  . Colonoscopy    HPI:  [name] [name] is a 61 y.o. male here to reschedule his colonoscopy. Recent attempt was incomplete due to to poor bowel prep. He had solid stool beyond the sigmoid colon. He tells me that he misunderstood the bowel prep. He tells me he had solid food after he completed the bowel prep.  Since his procedures, he was put on pantoprazole for severe erosive/ulcerative reflux esophagitis. He had a noncritical stricture which was dilated. His dysphagia has resolved. He no longer has to take over-the-counter antacids. Denies abdominal pain. Rare breakthrough heartburn. No melena or rectal bleeding.    Current Outpatient Prescriptions  Medication Sig Dispense Refill  . amLODipine (NORVASC) 5 MG tablet Take 1 tablet (5 mg total) by mouth daily.  30 tablet  12  . carvedilol (COREG) 25 MG tablet Take 1 tablet (25 mg total) by mouth 2 (two) times daily with a meal.  180 tablet  3  . fexofenadine (ALLEGRA) 180 MG tablet Take 1 tablet (180 mg total) by mouth daily.  30 tablet  2  . lisinopril-hydrochlorothiazide (PRINZIDE,ZESTORETIC) 20-12.5 MG per tablet Take 1 tablet by mouth daily.  30 tablet  12  . Multiple Vitamin (MULITIVITAMIN WITH MINERALS) TABS Take 1 tablet by mouth daily.      . nitroGLYCERIN (NITROSTAT) 0.4 MG SL tablet Place 0.4 mg under the tongue every 5 (five) minutes as needed. For chest pains      . NON FORMULARY Take 3 tablets by mouth daily. Study Drug cholesterol      . pantoprazole (PROTONIX) 40 MG tablet Take 40 mg by mouth daily.       [name] warfarin (COUMADIN) 5 MG tablet Take 5 mg by mouth daily.       Current Facility-Administered Medications  Medication Dose Route Frequency Provider Last Rate Last Dose  . DISCONTD: TDaP (BOOSTRIX) injection 0.5 mL  0.5 mL Intramuscular Once Vic Blackbird, MD         Allergies as of 07/05/2011  . (No Known Allergies)    Past Medical History  Diagnosis Date  . Arteriosclerotic cardiovascular disease (ASCVD)     AMI in 2000 treated at [facility]; cath in 12/2006->  Chronic total obstruction of the RCA;  drug-eluting stent placed in M1; inferior hypokinesis with an EF of 45%  . Tobacco abuse, in remission     20 pack years; quit in 2009  . Hypertension   . Chronic anticoagulation   . Atrial fibrillation     on coumadin for couple of years, stopped plavix at that time  . PVD (peripheral vascular disease)     Ct angiogram in 2009 revealed stable disease with 80% celiac stenosis,50% right renal artery ,ASCVD with ulceration in the abdominal aortashe  . Nephrolithiasis   . Cerebrovascular disease 2002    carotid stent  . Hyperlipidemia   . Myocardial infarction 10 yrs ago  . Dysphagia   . Testicular carcinoma 1990    right orchiectomy    Past Surgical History  Procedure Date  . Appendectomy 2004  . Testicular cancer 1990    right orchiectomy  . Colonoscopy 06/17/2011    INCOMPLETE, PREP POOR. Procedure: COLONOSCOPY;  Surgeon: Daneil Dolin, MD;  Location: [facility];  Service: Endoscopy;  Laterality: N/A;  10:00  . Esophagogastroduodenoscopy 06/17/2011    severe  erosive/ulcerative reflux esophagitis, soft noncritical stricture dilatied, small hh, antral erosion     Family History  Problem Relation Age of Onset  . Colon cancer Father 13    deceased  . Prostate cancer Father   . Liver disease Neg Hx   . Anesthesia problems Neg Hx   . Hypotension Neg Hx   . Malignant hyperthermia Neg Hx   . Pseudochol deficiency Neg Hx     History   Social History  . Marital Status: Married    Spouse Name: N/A    Number of Children: 2  . Years of Education: N/A   Occupational History  . disabled    Social History Main Topics  . Smoking status: Former Smoker -- 2.0 packs/day for 40 years    Types: Cigarettes    Quit date: 06/16/2008  .  Smokeless tobacco: Never Used  . Alcohol Use: No  . Drug Use: No  . Sexually Active: Yes    Birth Control/ Protection: None   Other Topics Concern  . Not on file   Social History Narrative  . No narrative on file      ROS:  General: Negative for anorexia, weight loss, fever, chills, fatigue, weakness. Eyes: Negative for vision changes.  ENT: Negative for hoarseness, difficulty swallowing , nasal congestion. CV: Negative for chest pain, angina, palpitations, dyspnea on exertion, peripheral edema.  Respiratory: Negative for dyspnea at rest, dyspnea on exertion, cough, sputum, wheezing.  GI: See history of present illness. GU:  Negative for dysuria, hematuria, urinary incontinence, urinary frequency, nocturnal urination.  MS: Negative for joint pain, low back pain.  Derm: Negative for rash or itching.  Neuro: Negative for weakness, abnormal sensation, seizure, frequent headaches, memory loss, confusion.  Psych: Negative for anxiety, depression, suicidal ideation, hallucinations.  Endo: Negative for unusual weight change.  Heme: Negative for bruising or bleeding. Allergy: Negative for rash or hives.    Physical Examination:  BP 129/79  Pulse 76  Temp(Src) 98.3 F (36.8 C) (Temporal)  Ht 6' (1.829 m)  Wt 210 lb 9.6 oz (95.528 kg)  BMI 28.56 kg/m2   General: Well-nourished, well-developed in no acute distress.  Head: Normocephalic, atraumatic.   Eyes: Conjunctiva pink, no icterus. Mouth: Oropharyngeal mucosa moist and pink , no lesions erythema or exudate. Neck: Supple without thyromegaly, masses, or lymphadenopathy.  Lungs: Clear to auscultation bilaterally.  Heart: Regular rate and rhythm, no murmurs rubs or gallops.  Abdomen: Bowel sounds are normal, nontender, nondistended, no hepatosplenomegaly or masses, no abdominal bruits or    hernia , no rebound or guarding.   Rectal: Not performed, deferred to time of colonoscopy Extremities: No lower extremity edema. No  clubbing or deformities.  Neuro: Alert and oriented x 4 , grossly normal neurologically.  Skin: Warm and dry, no rash or jaundice.   Psych: Alert and cooperative, normal mood and affect.    Lab Results  Component Value Date   INR 2.5 07/05/2011   INR 1.4 06/21/2011   INR 2.7 05/10/2011

## 2011-07-05 NOTE — Progress Notes (Signed)
Faxed to PCP

## 2011-07-14 MED ORDER — SODIUM CHLORIDE 0.45 % IV SOLN
Freq: Once | INTRAVENOUS | Status: AC
  Administered 2011-07-15: 10:00:00 via INTRAVENOUS

## 2011-07-15 ENCOUNTER — Encounter (HOSPITAL_COMMUNITY)
Admission: RE | Disposition: A | Discharge status: Home Health | Payer: Self-pay | Source: Ambulatory Visit | Attending: Internal Medicine

## 2011-07-15 ENCOUNTER — Encounter (HOSPITAL_COMMUNITY): Payer: Self-pay | Admitting: *Deleted

## 2011-07-15 ENCOUNTER — Ambulatory Visit (HOSPITAL_COMMUNITY)
Admission: RE | Admit: 2011-07-15 | Discharge: 2011-07-15 | Disposition: A | Discharge status: Home Health | Payer: Medicare Other | Source: Ambulatory Visit | Attending: Internal Medicine | Admitting: Internal Medicine

## 2011-07-15 DIAGNOSIS — I10 Essential (primary) hypertension: Secondary | ICD-10-CM | POA: Insufficient documentation

## 2011-07-15 DIAGNOSIS — E785 Hyperlipidemia, unspecified: Secondary | ICD-10-CM | POA: Insufficient documentation

## 2011-07-15 DIAGNOSIS — K620 Anal polyp: Secondary | ICD-10-CM

## 2011-07-15 DIAGNOSIS — D126 Benign neoplasm of colon, unspecified: Secondary | ICD-10-CM

## 2011-07-15 DIAGNOSIS — Z1211 Encounter for screening for malignant neoplasm of colon: Secondary | ICD-10-CM

## 2011-07-15 DIAGNOSIS — Z800 Family history of malignant neoplasm of digestive organs: Secondary | ICD-10-CM

## 2011-07-15 DIAGNOSIS — Z79899 Other long term (current) drug therapy: Secondary | ICD-10-CM | POA: Insufficient documentation

## 2011-07-15 DIAGNOSIS — D128 Benign neoplasm of rectum: Secondary | ICD-10-CM | POA: Insufficient documentation

## 2011-07-15 DIAGNOSIS — K621 Rectal polyp: Secondary | ICD-10-CM

## 2011-07-15 DIAGNOSIS — Z7901 Long term (current) use of anticoagulants: Secondary | ICD-10-CM | POA: Insufficient documentation

## 2011-07-15 DIAGNOSIS — I4891 Unspecified atrial fibrillation: Secondary | ICD-10-CM | POA: Insufficient documentation

## 2011-07-15 HISTORY — PX: COLONOSCOPY: SHX5424

## 2011-07-15 SURGERY — COLONOSCOPY
Anesthesia: Moderate Sedation

## 2011-07-15 MED ORDER — MEPERIDINE HCL 100 MG/ML IJ SOLN
INTRAMUSCULAR | Status: AC
  Filled 2011-07-15: qty 1

## 2011-07-15 MED ORDER — MIDAZOLAM HCL 5 MG/5ML IJ SOLN
INTRAMUSCULAR | Status: DC | PRN
  Administered 2011-07-15: 2 mg via INTRAVENOUS
  Administered 2011-07-15 (×2): 1 mg via INTRAVENOUS

## 2011-07-15 MED ORDER — MEPERIDINE HCL 100 MG/ML IJ SOLN
INTRAMUSCULAR | Status: DC | PRN
  Administered 2011-07-15: 50 mg via INTRAVENOUS
  Administered 2011-07-15: 25 mg via INTRAVENOUS

## 2011-07-15 MED ORDER — STERILE WATER FOR IRRIGATION IR SOLN
Status: DC | PRN
  Administered 2011-07-15: 11:00:00

## 2011-07-15 MED ORDER — MIDAZOLAM HCL 5 MG/5ML IJ SOLN
INTRAMUSCULAR | Status: AC
  Filled 2011-07-15: qty 10

## 2011-07-15 NOTE — Discharge Instructions (Addendum)
Colonoscopy Discharge Instructions  Read the instructions outlined below and refer to this sheet in the next few weeks. These discharge instructions provide you with general information on caring for yourself after you leave the hospital. Your doctor may also give you specific instructions. While your treatment has been planned according to the most current medical practices available, unavoidable complications occasionally occur. If you have any problems or questions after discharge, call Dr. Gala Romney at [phone]. ACTIVITY  You may resume your regular activity, but move at a slower pace for the next 24 hours.   Take frequent rest periods for the next 24 hours.   Walking will help get rid of the air and reduce the bloated feeling in your belly (abdomen).   No driving for 24 hours (because of the medicine (anesthesia) used during the test).    Do not sign any important legal documents or operate any machinery for 24 hours (because of the anesthesia used during the test).  NUTRITION  Drink plenty of fluids.   You may resume your normal diet as instructed by your doctor.   Begin with a light meal and progress to your normal diet. Heavy or fried foods are harder to digest and may make you feel sick to your stomach (nauseated).   Avoid alcoholic beverages for 24 hours or as instructed.  MEDICATIONS  You may resume your normal medications unless your doctor tells you otherwise.  WHAT YOU CAN EXPECT TODAY  Some feelings of bloating in the abdomen.   Passage of more gas than usual.   Spotting of blood in your stool or on the toilet paper.  IF YOU HAD POLYPS REMOVED DURING THE COLONOSCOPY:  No aspirin products for 7 days or as instructed.   No alcohol for 7 days or as instructed.   Eat a soft diet for the next 24 hours.  FINDING OUT THE RESULTS OF YOUR TEST Not all test results are available during your visit. If your test results are not back during the visit, make an appointment  with your caregiver to find out the results. Do not assume everything is normal if you have not heard from your caregiver or the medical facility. It is important for you to follow up on all of your test results.  SEEK IMMEDIATE MEDICAL ATTENTION IF:  You have more than a spotting of blood in your stool.   Your belly is swollen (abdominal distention).   You are nauseated or vomiting.   You have a temperature over 101.   You have abdominal pain or discomfort that is severe or gets worse throughout the day.     Polyp information provided.  Further recommendations to follow pending review of pathology report.  Resume Coumadin today.  Colon Polyps A polyp is extra tissue that grows inside your body. Colon polyps grow in the large intestine. The large intestine, also called the colon, is part of your digestive system. It is a long, hollow tube at the end of your digestive tract where your body makes and stores stool. Most polyps are not dangerous. They are benign. This means they are not cancerous. But over time, some types of polyps can turn into cancer. Polyps that are smaller than a pea are usually not harmful. But larger polyps could someday become or may already be cancerous. To be safe, doctors remove all polyps and test them.  WHO GETS POLYPS? Anyone can get polyps, but certain people are more likely than others. You may have a greater chance  of getting polyps if: You are over 61.  You have had polyps before.  Someone in your family has had polyps.  Someone in your family has had cancer of the large intestine.  Find out if someone in your family has had polyps. You may also be more likely to get polyps if you:  Eat a lot of fatty foods.  Smoke.  Drink alcohol.  Do not exercise.  Eat too much.  SYMPTOMS  Most small polyps do not cause symptoms. People often do not know they have one until their caregiver finds it during a regular checkup or while testing them for something  else. Some people do have symptoms like these: Bleeding from the anus. You might notice blood on your underwear or on toilet paper after you have had a bowel movement.  Constipation or diarrhea that lasts more than a week.  Blood in the stool. Blood can make stool look black or it can show up as red streaks in the stool.  If you have any of these symptoms, see your caregiver. HOW DOES THE DOCTOR TEST FOR POLYPS? The doctor can use four tests to check for polyps: Digital rectal exam. The caregiver wears gloves and checks your rectum (the last part of the large intestine) to see if it feels normal. This test would find polyps only in the rectum. Your caregiver may need to do one of the other tests listed below to find polyps higher up in the intestine.  Barium enema. The caregiver puts a liquid called barium into your rectum before taking x-rays of your large intestine. Barium makes your intestine look white in the pictures. Polyps are dark, so they are easy to see.  Sigmoidoscopy. With this test, the caregiver can see inside your large intestine. A thin flexible tube is placed into your rectum. The device is called a sigmoidoscope, which has a light and a tiny video camera in it. The caregiver uses the sigmoidoscope to look at the last third of your large intestine.  Colonoscopy. This test is like sigmoidoscopy, but the caregiver looks at all of the large intestine. It usually requires sedation. This is the most common method for finding and removing polyps.  TREATMENT  The caregiver will remove the polyp during sigmoidoscopy or colonoscopy. The polyp is then tested for cancer.  If you have had polyps, your caregiver may want you to get tested regularly in the future.  PREVENTION  There is not one sure way to prevent polyps. You might be able to lower your risk of getting them if you: Eat more fruits and vegetables and less fatty food.  Do not smoke.  Avoid alcohol.  Exercise every day.  Lose  weight if you are overweight.  Eating more calcium and folate can also lower your risk of getting polyps. Some foods that are rich in calcium are milk, cheese, and broccoli. Some foods that are rich in folate are chickpeas, kidney beans, and spinach.  Aspirin might help prevent polyps. Studies are under way.  Document Released: 01/14/2004 Document Revised: 04/08/2011 Document Reviewed: 06/21/2007 [facility] Patient Information 2012 [place].

## 2011-07-15 NOTE — Interval H&P Note (Signed)
History and Physical Interval Note:  07/15/2011 10:52 AM  [name] [name]  has presented today for surgery, with the diagnosis of family hx of CRC  The various methods of treatment have been discussed with the patient and family. After consideration of risks, benefits and other options for treatment, the patient has consented to  Procedure(s) (LRB): COLONOSCOPY (N/A) as a surgical intervention .  The patients' history has been reviewed, patient examined, no change in status, stable for surgery.  I have reviewed the patients' chart and labs.  Questions were answered to the patient's satisfaction.     Manus Rudd

## 2011-07-15 NOTE — H&P (View-Only) (Signed)
Primary Care Physician:  Vic Blackbird, MD, MD  Primary Gastroenterologist:  Garfield Cornea, MD   Chief Complaint  Patient presents with  . Follow-up  . Colonoscopy    HPI:  [name] [name] is a 61 y.o. male here to reschedule his colonoscopy. Recent attempt was incomplete due to to poor bowel prep. He had solid stool beyond the sigmoid colon. He tells me that he misunderstood the bowel prep. He tells me he had solid food after he completed the bowel prep.  Since his procedures, he was put on pantoprazole for severe erosive/ulcerative reflux esophagitis. He had a noncritical stricture which was dilated. His dysphagia has resolved. He no longer has to take over-the-counter antacids. Denies abdominal pain. Rare breakthrough heartburn. No melena or rectal bleeding.    Current Outpatient Prescriptions  Medication Sig Dispense Refill  . amLODipine (NORVASC) 5 MG tablet Take 1 tablet (5 mg total) by mouth daily.  30 tablet  12  . carvedilol (COREG) 25 MG tablet Take 1 tablet (25 mg total) by mouth 2 (two) times daily with a meal.  180 tablet  3  . fexofenadine (ALLEGRA) 180 MG tablet Take 1 tablet (180 mg total) by mouth daily.  30 tablet  2  . lisinopril-hydrochlorothiazide (PRINZIDE,ZESTORETIC) 20-12.5 MG per tablet Take 1 tablet by mouth daily.  30 tablet  12  . Multiple Vitamin (MULITIVITAMIN WITH MINERALS) TABS Take 1 tablet by mouth daily.      . nitroGLYCERIN (NITROSTAT) 0.4 MG SL tablet Place 0.4 mg under the tongue every 5 (five) minutes as needed. For chest pains      . NON FORMULARY Take 3 tablets by mouth daily. Study Drug cholesterol      . pantoprazole (PROTONIX) 40 MG tablet Take 40 mg by mouth daily.       [name] warfarin (COUMADIN) 5 MG tablet Take 5 mg by mouth daily.       Current Facility-Administered Medications  Medication Dose Route Frequency Provider Last Rate Last Dose  . DISCONTD: TDaP (BOOSTRIX) injection 0.5 mL  0.5 mL Intramuscular Once Vic Blackbird, MD         Allergies as of 07/05/2011  . (No Known Allergies)    Past Medical History  Diagnosis Date  . Arteriosclerotic cardiovascular disease (ASCVD)     AMI in 2000 treated at [facility]; cath in 12/2006->  Chronic total obstruction of the RCA;  drug-eluting stent placed in M1; inferior hypokinesis with an EF of 45%  . Tobacco abuse, in remission     20 pack years; quit in 2009  . Hypertension   . Chronic anticoagulation   . Atrial fibrillation     on coumadin for couple of years, stopped plavix at that time  . PVD (peripheral vascular disease)     Ct angiogram in 2009 revealed stable disease with 80% celiac stenosis,50% right renal artery ,ASCVD with ulceration in the abdominal aortashe  . Nephrolithiasis   . Cerebrovascular disease 2002    carotid stent  . Hyperlipidemia   . Myocardial infarction 10 yrs ago  . Dysphagia   . Testicular carcinoma 1990    right orchiectomy    Past Surgical History  Procedure Date  . Appendectomy 2004  . Testicular cancer 1990    right orchiectomy  . Colonoscopy 06/17/2011    INCOMPLETE, PREP POOR. Procedure: COLONOSCOPY;  Surgeon: Daneil Dolin, MD;  Location: [facility];  Service: Endoscopy;  Laterality: N/A;  10:00  . Esophagogastroduodenoscopy 06/17/2011    severe  erosive/ulcerative reflux esophagitis, soft noncritical stricture dilatied, small hh, antral erosion     Family History  Problem Relation Age of Onset  . Colon cancer Father 27    deceased  . Prostate cancer Father   . Liver disease Neg Hx   . Anesthesia problems Neg Hx   . Hypotension Neg Hx   . Malignant hyperthermia Neg Hx   . Pseudochol deficiency Neg Hx     History   Social History  . Marital Status: Married    Spouse Name: N/A    Number of Children: 2  . Years of Education: N/A   Occupational History  . disabled    Social History Main Topics  . Smoking status: Former Smoker -- 2.0 packs/day for 40 years    Types: Cigarettes    Quit date: 06/16/2008  .  Smokeless tobacco: Never Used  . Alcohol Use: No  . Drug Use: No  . Sexually Active: Yes    Birth Control/ Protection: None   Other Topics Concern  . Not on file   Social History Narrative  . No narrative on file      ROS:  General: Negative for anorexia, weight loss, fever, chills, fatigue, weakness. Eyes: Negative for vision changes.  ENT: Negative for hoarseness, difficulty swallowing , nasal congestion. CV: Negative for chest pain, angina, palpitations, dyspnea on exertion, peripheral edema.  Respiratory: Negative for dyspnea at rest, dyspnea on exertion, cough, sputum, wheezing.  GI: See history of present illness. GU:  Negative for dysuria, hematuria, urinary incontinence, urinary frequency, nocturnal urination.  MS: Negative for joint pain, low back pain.  Derm: Negative for rash or itching.  Neuro: Negative for weakness, abnormal sensation, seizure, frequent headaches, memory loss, confusion.  Psych: Negative for anxiety, depression, suicidal ideation, hallucinations.  Endo: Negative for unusual weight change.  Heme: Negative for bruising or bleeding. Allergy: Negative for rash or hives.    Physical Examination:  BP 129/79  Pulse 76  Temp(Src) 98.3 F (36.8 C) (Temporal)  Ht 6' (1.829 m)  Wt 210 lb 9.6 oz (95.528 kg)  BMI 28.56 kg/m2   General: Well-nourished, well-developed in no acute distress.  Head: Normocephalic, atraumatic.   Eyes: Conjunctiva pink, no icterus. Mouth: Oropharyngeal mucosa moist and pink , no lesions erythema or exudate. Neck: Supple without thyromegaly, masses, or lymphadenopathy.  Lungs: Clear to auscultation bilaterally.  Heart: Regular rate and rhythm, no murmurs rubs or gallops.  Abdomen: Bowel sounds are normal, nontender, nondistended, no hepatosplenomegaly or masses, no abdominal bruits or    hernia , no rebound or guarding.   Rectal: Not performed, deferred to time of colonoscopy Extremities: No lower extremity edema. No  clubbing or deformities.  Neuro: Alert and oriented x 4 , grossly normal neurologically.  Skin: Warm and dry, no rash or jaundice.   Psych: Alert and cooperative, normal mood and affect.    Lab Results  Component Value Date   INR 2.5 07/05/2011   INR 1.4 06/21/2011   INR 2.7 05/10/2011

## 2011-07-15 NOTE — Op Note (Signed)
[facility] [address] [place], [place]  [address]  COLONOSCOPY PROCEDURE REPORT  PATIENT:  [name] [name], [name] [name]  MR#:  [id number] BIRTHDATE:  20-Sep-1950, 67 yrs. old  GENDER:  male ENDOSCOPIST:  R. Garfield Cornea, MD FACP [facility] REF. BY:          Dr. Mariann Laster dura PROCEDURE DATE:  07/15/2011 PROCEDURE:  Colonoscopy with polyp ablation multiple snare polypectomies.  INDICATIONS:  First ever high risk  screening colonoscopy.  INFORMED CONSENT:  The risks, benefits, alternatives and imponderables including but not limited to bleeding, perforation as well as the possibility of a missed lesion have been reviewed. The potential for biopsy, lesion removal, etc. have also been discussed.  Questions have been answered.  All parties agreeable. Please see the history and physical in the medical record for more information.  MEDICATIONS:  Versed 4 mg IV and Demerol 75 mg IV in divided doses.  DESCRIPTION OF PROCEDURE:  After a digital rectal exam was performed, the [id number]) colonoscope was advanced from the anus through the rectum and colon to the area of the cecum, ileocecal valve and appendiceal orifice.  The cecum was deeply intubated.  These structures were well-seen and photographed for the record.  From the level of the cecum and ileocecal valve, the scope was slowly and cautiously withdrawn.  The mucosal surfaces were carefully surveyed utilizing scope tip deflection to facilitate fold flattening as needed.  The scope was pulled down into the rectum where a thorough examination including retroflexion was performed. <<PROCEDUREIMAGES>>  FINDINGS: Suboptimal preparation. 2 - 5 mm polyps in the rectum at 10 cm with adjacent diminutive polyp .   Multiple 5-8 mm polyps at the hepatic and splenic flexure. The remainder of the colon appear grossly normal.  THERAPEUTIC / DIAGNOSTIC MANEUVERS PERFORMED:  Multiple hot and cold snare polypectomies performed removing polyps  from the colon and rectum. Diminutive polyps  in this rectum were ablated with the tip of the hot snare loop.  COMPLICATIONS:  None  CECAL WITHDRAWAL TIME: 15 minutes  IMPRESSION:  Multiple rectal and colonic polyps treated as described above.  RECOMMENDATIONS:  Resume Coumadin today. Followup pathology.  ______________________________ R. Garfield Cornea, MD [name]  CC:  n. eSIGNED:   R. Garfield Cornea at 07/15/2011 11:38 AM  Snell, [name], [id number]

## 2011-07-18 ENCOUNTER — Encounter: Payer: Self-pay | Admitting: Internal Medicine

## 2011-07-19 ENCOUNTER — Encounter (HOSPITAL_COMMUNITY): Payer: Self-pay | Admitting: Internal Medicine

## 2011-08-02 ENCOUNTER — Ambulatory Visit (INDEPENDENT_AMBULATORY_CARE_PROVIDER_SITE_OTHER): Payer: Medicare Other

## 2011-08-02 ENCOUNTER — Ambulatory Visit (INDEPENDENT_AMBULATORY_CARE_PROVIDER_SITE_OTHER): Payer: Medicare Other | Admitting: *Deleted

## 2011-08-02 ENCOUNTER — Encounter: Payer: Self-pay | Admitting: Cardiology

## 2011-08-02 VITALS — BP 143/82 | HR 66 | Ht 74.0 in | Wt 206.0 lb

## 2011-08-02 DIAGNOSIS — I4891 Unspecified atrial fibrillation: Secondary | ICD-10-CM

## 2011-08-02 DIAGNOSIS — Z7901 Long term (current) use of anticoagulants: Secondary | ICD-10-CM

## 2011-08-02 DIAGNOSIS — I10 Essential (primary) hypertension: Secondary | ICD-10-CM

## 2011-08-02 LAB — POCT INR: INR: 2.3

## 2011-08-02 NOTE — Progress Notes (Signed)
**Note De-Identified [name] [name] Obfuscation** S: Pt. arrives in office for a 1 month BP check with nurse. B: On last BP check on 06-21-11 pt. was advised to change Lisinopril to Lisinopril HCT 20/12.5 mg daily for better control of BP, to monitor, record and bring BP diary to this visit.  A: Pt. has no complaints at this time. His BP this morning is 143/82 and at last BP check his BP was 136/75. Pt. states he took his medications this morning at 8:30 am. He did bring his BP diary to this visit (sent to scan into pt's chart and a copy is pinned to board at Dr. Izell Holliday desk in nursing station). BP diary recordings are similar to today's BP. R: Pt. Is advised to continue current medical treatment and that we will contact him with Dr. Izell Boscobel recommendations./LV

## 2011-08-03 MED ORDER — LISINOPRIL-HYDROCHLOROTHIAZIDE 20-12.5 MG PO TABS: 2.0000 | ORAL_TABLET | Freq: Every day | ORAL | Status: DC

## 2011-08-03 NOTE — Progress Notes (Signed)
**Note De-Identified [name] [name] Obfuscation** Pt. Is advised, he verbalized understanding.Lab order faxed to [facility] lab and RX sent to [facility] to fill./LV

## 2011-08-03 NOTE — Progress Notes (Signed)
Patient ID: [name] [name], male   DOB: 09/19/1950, 61 y.o.   MRN: [id number]  Blood pressure control is fairly good.  There are no substantial elevations, but frequent values in the 140-155 range.  Diastolics are all 90 or below.  Slightly improved control would be desirable.  Lisinopril/hydrochlorothiazide can be increased to 40/25 mg per day.  BMet in one month.

## 2011-08-04 ENCOUNTER — Encounter: Payer: Self-pay | Admitting: Cardiology

## 2011-08-05 ENCOUNTER — Encounter: Payer: Self-pay | Admitting: Family Medicine

## 2011-08-05 ENCOUNTER — Ambulatory Visit (INDEPENDENT_AMBULATORY_CARE_PROVIDER_SITE_OTHER): Payer: Medicare Other | Admitting: Family Medicine

## 2011-08-05 VITALS — BP 160/90 | HR 84 | Resp 16 | Ht 67.75 in | Wt 209.1 lb

## 2011-08-05 DIAGNOSIS — T148 Other injury of unspecified body region: Secondary | ICD-10-CM

## 2011-08-05 DIAGNOSIS — W57XXXA Bitten or stung by nonvenomous insect and other nonvenomous arthropods, initial encounter: Secondary | ICD-10-CM

## 2011-08-05 DIAGNOSIS — I10 Essential (primary) hypertension: Secondary | ICD-10-CM

## 2011-08-05 MED ORDER — DOXYCYCLINE HYCLATE 100 MG PO TABS: 100.0000 mg | ORAL_TABLET | Freq: Two times a day (BID) | ORAL | Status: AC

## 2011-08-05 NOTE — Assessment & Plan Note (Signed)
Pt to take BP meds after visit

## 2011-08-05 NOTE — Progress Notes (Signed)
  Subjective:    Patient ID: [name] [name], male    DOB: Nov 24, 1950, 61 y.o.   MRN: [id number]  HPI  HTN- no BP meds taken today  Tick bite- pt was out fishing and noticed a tick on his groin when he came home, he removed the tick 2 days ago but has redness and pain at the site.Last night the pain moved down his groin to his thigh. Denies knee swelling, fever, abd pain, N/V   Review of Systems - per above     Objective:   Physical Exam GEN- NAD, alert and oriented ABD-NABS,soft, NT,ND Skin- small dime size macular lesion on right groin over mons pubis- scab in center, mild TTP, no swelling of testes, skin on thigh normal GU-? Small left inguinal hernia, no penile lesions,  Ext- no swelling noted      Assessment & Plan:

## 2011-08-05 NOTE — Assessment & Plan Note (Signed)
Doxycycline x 14 weeks, given handout red flags

## 2011-08-05 NOTE — Patient Instructions (Addendum)
Take the antibiotics as prescribed Wood Tick Bite Ticks are insects that attach themselves to the skin. Sometimes, ticks carry diseases that can make a person very ill. The most common places for ticks to attach themselves are the scalp, neck, armpits, waist, and groin. Ticks must be removed as soon as possible to help prevent diseases.   REMOVING A TICK  Put on latex gloves before trying to remove a tick.   Use tweezers to grasp the tick as close to the skin as possible.   Pull gently until the tick lets go. Pull the tick off in one motion. Do not twist the tick or jerk it suddenly. This may break off the tick's head or mouth parts.   Do not crush the tick's body. This could transfer diseased fluids from the tick into your body.   After the tick is removed, wash the bite area and your hands with soap and water.   Apply a small amount of anti-infection cream or ointment to the bite site.   Wash any tools that were used.   Save the tick in a jar or plastic bag to bring to the doctor if necessary.   Do not apply a hot match, petroleum jelly, or fingernail polish to the tick. This may increase the chances of disease from the tick bite.  You might need a tetanus shot now if:  You cannot remember when your last tetanus shot was.   You have never had a tetanus shot.   Your bite site was dirty.  GET HELP RIGHT AWAY IF:    You cannot remove a tick or part of the tick is left in the skin.   You have redness and puffiness (swelling) in the area of the tick bite.   You have tender, puffy lymph glands.   You have watery poop (diarrhea).   You lose weight.   You have a cough.   You are more tired than normal.   You have muscle, joint, or bone pain.   You have belly (abdominal) pain.   You have a headache.   You have a rash.   You have a temperature by mouth above 102 F (38.9 C), not controlled by medicine.   You have trouble walking or moving your legs.   You lose  feeling (numbness) in your legs.   You have shortness of breath.   You become confused.   You throw up (vomit) many times.  MAKE SURE YOU:    Understand these instructions.   Will watch your condition.   Will get help right away if you are not doing well or get worse.  Document Released: 07/14/2009 Document Revised: 04/08/2011 Document Reviewed: 07/14/2009 [facility] Patient Information 2012 [place].

## 2011-08-23 ENCOUNTER — Ambulatory Visit (INDEPENDENT_AMBULATORY_CARE_PROVIDER_SITE_OTHER): Payer: Medicare Other | Admitting: Family Medicine

## 2011-08-23 ENCOUNTER — Encounter: Payer: Self-pay | Admitting: Family Medicine

## 2011-08-23 ENCOUNTER — Telehealth: Payer: Self-pay | Admitting: Family Medicine

## 2011-08-23 VITALS — BP 122/80 | HR 79 | Temp 98.1°F | Resp 18 | Ht 67.75 in | Wt 205.1 lb

## 2011-08-23 DIAGNOSIS — R197 Diarrhea, unspecified: Secondary | ICD-10-CM

## 2011-08-23 DIAGNOSIS — T148 Other injury of unspecified body region: Secondary | ICD-10-CM

## 2011-08-23 DIAGNOSIS — W57XXXA Bitten or stung by nonvenomous insect and other nonvenomous arthropods, initial encounter: Secondary | ICD-10-CM

## 2011-08-23 DIAGNOSIS — T148XXA Other injury of unspecified body region, initial encounter: Secondary | ICD-10-CM

## 2011-08-23 LAB — COMPREHENSIVE METABOLIC PANEL
ALT: 24 U/L (ref 0–53)
AST: 20 U/L (ref 0–37)
Albumin: 3.8 g/dL (ref 3.5–5.2)
Alkaline Phosphatase: 94 U/L (ref 39–117)
BUN: 21 mg/dL (ref 6–23)
CO2: 30 mEq/L (ref 19–32)
Calcium: 10.5 mg/dL (ref 8.4–10.5)
Chloride: 96 mEq/L (ref 96–112)
Creat: 1.56 mg/dL — ABNORMAL HIGH (ref 0.50–1.35)
Glucose, Bld: 84 mg/dL (ref 70–99)
Potassium: 4.7 mEq/L (ref 3.5–5.3)
Sodium: 134 mEq/L — ABNORMAL LOW (ref 135–145)
Total Bilirubin: 0.5 mg/dL (ref 0.3–1.2)
Total Protein: 7.4 g/dL (ref 6.0–8.3)

## 2011-08-23 LAB — CBC
HCT: 44.1 % (ref 39.0–52.0)
Hemoglobin: 15.6 g/dL (ref 13.0–17.0)
MCH: 30.9 pg (ref 26.0–34.0)
MCHC: 35.4 g/dL (ref 30.0–36.0)
MCV: 87.3 fL (ref 78.0–100.0)
Platelets: 167 10*3/uL (ref 150–400)
RBC: 5.05 MIL/uL (ref 4.22–5.81)
RDW: 13.1 % (ref 11.5–15.5)
WBC: 7.4 10*3/uL (ref 4.0–10.5)

## 2011-08-23 LAB — LIPASE: Lipase: 76 U/L — ABNORMAL HIGH (ref 11–59)

## 2011-08-23 NOTE — Assessment & Plan Note (Signed)
Area of bite, now resolved, completed doxycycline. I will contact GI to see if diarrhea could be an extension of a tick borne illness, though he has no other systemic findings

## 2011-08-23 NOTE — Assessment & Plan Note (Signed)
Exam benign today, ? Related to tick exposure, vs antibiotics though pt had diarrea prior to starting, parasitic infection on differential as initially [place], no new medications. Obtain stat labs, pt to bring in stool samples for C diff, O and P, stool culture. Will hold on meds to stop diarrhea at this time.

## 2011-08-23 NOTE — Progress Notes (Signed)
  Subjective:    Patient ID: [name] [name], male    DOB: 1950-11-25, 61 y.o.   MRN: [id number]  HPI  Diarrhea x 3-4 weeks, started after diagnosis of tick bite. Completed doxycyline as prescribed. Has tried imodium but this has not helped. Denies blood in stool. Feels the need to have BM after each meal, admits to decreased appetite, no fever feels well otherwise. Has lost a few pounds.+abdominal cramping for about 30 minutes after a BM, no pain prior to. Colonoscopy done in March 2013. Occ nausea, when he sees food, otherwise no N/V  Review of Systems  GEN- denies fatigue, fever, weight loss,weakness, recent illness HEENT- denies eye drainage, change in vision, nasal discharge, CVS- denies chest pain, palpitations RESP- denies SOB, cough, wheeze ABD- + N/no V,+ change in stools, abd pain GU- denies dysuria, hematuria, dribbling, incontinence MSK- denies joint pain, muscle aches, injury Neuro- denies headache, dizziness, syncope, seizure activity       Objective:   Physical Exam GEN- NAD, alert and oriented x3,non toxic appearing HEENT- PERRL, EOMI, non injected sclera, pink conjunctiva, MMM, oropharynx clear Neck- Supple, no thryomegaly CVS- RRR, no murmur RESP-CTAB ABD-NABS,soft, NT,ND EXT- No edema Pulses- Radial, DP- 2+ Skin- small scab where tick bite was- Right groin       Assessment & Plan:

## 2011-08-23 NOTE — Telephone Encounter (Signed)
Noted  

## 2011-08-23 NOTE — Telephone Encounter (Signed)
Please call and check on patient, he was put on doxycycline for the tick bite.

## 2011-08-23 NOTE — Patient Instructions (Signed)
Get the labs drawn today Bring in the stool samples  If you have severe pain or blood in stool go to the ER Keep up with the fluids Keep previous F/U appt

## 2011-08-23 NOTE — Telephone Encounter (Signed)
I spoke with pt  Having abdominal pain and watery diarrhea x 3 weeks since tick bite, will bring him in for office visit this after noon at 2:30pm

## 2011-08-25 ENCOUNTER — Other Ambulatory Visit: Payer: Self-pay | Admitting: Family Medicine

## 2011-08-25 LAB — CLOSTRIDIUM DIFFICILE EIA: CDIFTX: NEGATIVE

## 2011-08-26 ENCOUNTER — Other Ambulatory Visit: Payer: Self-pay

## 2011-08-26 DIAGNOSIS — I10 Essential (primary) hypertension: Secondary | ICD-10-CM

## 2011-08-26 LAB — OVA AND PARASITE SCREEN: OP: NONE SEEN

## 2011-08-27 ENCOUNTER — Telehealth: Payer: Self-pay | Admitting: Family Medicine

## 2011-08-27 NOTE — Telephone Encounter (Signed)
Discussed labs, stool studies negative.Diarrhea now resolved.

## 2011-08-29 LAB — STOOL CULTURE

## 2011-09-01 ENCOUNTER — Ambulatory Visit (INDEPENDENT_AMBULATORY_CARE_PROVIDER_SITE_OTHER): Payer: Medicare Other | Admitting: *Deleted

## 2011-09-01 DIAGNOSIS — I4891 Unspecified atrial fibrillation: Secondary | ICD-10-CM

## 2011-09-01 DIAGNOSIS — Z7901 Long term (current) use of anticoagulants: Secondary | ICD-10-CM

## 2011-09-01 LAB — POCT INR: INR: 1.6

## 2011-09-23 ENCOUNTER — Ambulatory Visit (INDEPENDENT_AMBULATORY_CARE_PROVIDER_SITE_OTHER): Payer: Medicare Other | Admitting: *Deleted

## 2011-09-23 DIAGNOSIS — Z7901 Long term (current) use of anticoagulants: Secondary | ICD-10-CM

## 2011-09-23 DIAGNOSIS — I4891 Unspecified atrial fibrillation: Secondary | ICD-10-CM

## 2011-09-23 LAB — POCT INR: INR: 2.2

## 2011-09-28 ENCOUNTER — Encounter: Payer: Self-pay | Admitting: Family Medicine

## 2011-09-28 ENCOUNTER — Ambulatory Visit (INDEPENDENT_AMBULATORY_CARE_PROVIDER_SITE_OTHER): Payer: Medicare Other | Admitting: Family Medicine

## 2011-09-28 VITALS — BP 120/80 | HR 84 | Resp 16 | Ht 67.75 in | Wt 208.4 lb

## 2011-09-28 DIAGNOSIS — I10 Essential (primary) hypertension: Secondary | ICD-10-CM

## 2011-09-28 DIAGNOSIS — H00019 Hordeolum externum unspecified eye, unspecified eyelid: Secondary | ICD-10-CM

## 2011-09-28 DIAGNOSIS — N186 End stage renal disease: Secondary | ICD-10-CM | POA: Insufficient documentation

## 2011-09-28 DIAGNOSIS — J309 Allergic rhinitis, unspecified: Secondary | ICD-10-CM

## 2011-09-28 DIAGNOSIS — J302 Other seasonal allergic rhinitis: Secondary | ICD-10-CM

## 2011-09-28 DIAGNOSIS — I4891 Unspecified atrial fibrillation: Secondary | ICD-10-CM

## 2011-09-28 DIAGNOSIS — H00016 Hordeolum externum left eye, unspecified eyelid: Secondary | ICD-10-CM | POA: Insufficient documentation

## 2011-09-28 DIAGNOSIS — I2510 Atherosclerotic heart disease of native coronary artery without angina pectoris: Secondary | ICD-10-CM

## 2011-09-28 DIAGNOSIS — N189 Chronic kidney disease, unspecified: Secondary | ICD-10-CM

## 2011-09-28 NOTE — Assessment & Plan Note (Signed)
Warm compresses to left eye the swelling has gone down significantly.

## 2011-09-28 NOTE — Assessment & Plan Note (Signed)
He is doing well. His LDL looks very good his triglycerides are high and we have discussed this regarding his diet

## 2011-09-28 NOTE — Patient Instructions (Addendum)
You have a stye on your left eye, keep warm compresses on your eye Your blood pressure looks good Continue to work on your diet-low fat  F/U 3 months

## 2011-09-28 NOTE — Assessment & Plan Note (Signed)
He has some chronic kidney disease from his hypertension. His last set of labs showed an improvement in his BUN and creatinine

## 2011-09-28 NOTE — Assessment & Plan Note (Signed)
He is to continue his Allegra.

## 2011-09-28 NOTE — Assessment & Plan Note (Signed)
No recent chest pain. He is being followed by Coumadin clinic

## 2011-09-28 NOTE — Progress Notes (Signed)
  Subjective:    Patient ID: [name] [name], male    DOB: 10-Feb-1951, 61 y.o.   MRN: [id number]  HPI Patient here to follow chronic medical problems. His only concern is swelling in his left eye which started over the weekend with milligrams. He had a swollen knot which is gone down significantly. No problems with his medications. He recently had his blood pressure medication increased by his cardiologist Labs reviewed   Review of Systems   GEN- denies fatigue, fever, weight loss,weakness, recent illness HEENT- denies eye drainage, change in vision, nasal discharge, CVS- denies chest pain, palpitations RESP- denies SOB, cough, wheeze ABD- denies N/V, change in stools, abd pain GU- denies dysuria, hematuria, dribbling, incontinence MSK- denies joint pain, muscle aches, injury Neuro- denies headache, dizziness, syncope, seizure activity      Objective:   Physical Exam GEN- NAD, alert and oriented x3, pleasant HEENT- PERRL, EOMI, non injected sclera, pink conjunctiva, MMM, oropharynx clear, TM clear bialt, nares, small stye left lower eyelid Neck- Supple,no LAD CVS- irregular rhythm, normal rate, no murmur RESP-CTAB ABD-NABS,soft, NT,ND EXT- No edema Pulses- Radial, DP- 2+       Assessment & Plan:

## 2011-09-28 NOTE — Assessment & Plan Note (Addendum)
Blood pressure recheck much improved with increased dose of lisinopril HCTZ repeat metabolic panel

## 2011-10-05 ENCOUNTER — Other Ambulatory Visit: Payer: Self-pay

## 2011-10-05 MED ORDER — WARFARIN SODIUM 5 MG PO TABS: 5.0000 mg | ORAL_TABLET | Freq: Every day | ORAL | Status: DC

## 2011-10-19 ENCOUNTER — Ambulatory Visit: Payer: Medicare Other | Admitting: Family Medicine

## 2011-10-21 ENCOUNTER — Ambulatory Visit (INDEPENDENT_AMBULATORY_CARE_PROVIDER_SITE_OTHER): Payer: Medicare Other | Admitting: *Deleted

## 2011-10-21 DIAGNOSIS — I4891 Unspecified atrial fibrillation: Secondary | ICD-10-CM

## 2011-10-21 DIAGNOSIS — Z7901 Long term (current) use of anticoagulants: Secondary | ICD-10-CM

## 2011-10-21 LAB — POCT INR: INR: 2.3

## 2011-11-12 ENCOUNTER — Ambulatory Visit (INDEPENDENT_AMBULATORY_CARE_PROVIDER_SITE_OTHER): Payer: Medicare Other | Admitting: Cardiology

## 2011-11-12 ENCOUNTER — Encounter: Payer: Self-pay | Admitting: Cardiology

## 2011-11-12 ENCOUNTER — Other Ambulatory Visit: Payer: Self-pay | Admitting: Cardiology

## 2011-11-12 VITALS — BP 131/71 | HR 82 | Ht 72.0 in | Wt 199.0 lb

## 2011-11-12 DIAGNOSIS — I2510 Atherosclerotic heart disease of native coronary artery without angina pectoris: Secondary | ICD-10-CM

## 2011-11-12 DIAGNOSIS — N189 Chronic kidney disease, unspecified: Secondary | ICD-10-CM

## 2011-11-12 DIAGNOSIS — C6290 Malignant neoplasm of unspecified testis, unspecified whether descended or undescended: Secondary | ICD-10-CM

## 2011-11-12 DIAGNOSIS — I4891 Unspecified atrial fibrillation: Secondary | ICD-10-CM

## 2011-11-12 DIAGNOSIS — I10 Essential (primary) hypertension: Secondary | ICD-10-CM

## 2011-11-12 DIAGNOSIS — E785 Hyperlipidemia, unspecified: Secondary | ICD-10-CM

## 2011-11-12 DIAGNOSIS — Z7901 Long term (current) use of anticoagulants: Secondary | ICD-10-CM

## 2011-11-12 NOTE — Progress Notes (Signed)
Patient ID: [name] [name], male   DOB: Apr 02, 1951, 61 y.o.   MRN: [id number]  HPI: Scheduled return visit for this very nice gentleman with coronary artery disease, chronic atrial fibrillation requiring anticoagulation, hypertension and hyperlipidemia.  Since his last visit, he has done very well symptomatically.  He remains active including doing yard work without any cardiopulmonary symptoms.  He notes no palpitations.  He has had chronic diarrhea with 3 liquid bowel movements per day since colonoscopy by Dr. Oneida Alar approximately 4 months ago.  He has monitored blood pressure, although not carefully, with excellent control.  He has recently had URI symptoms that are now resolving.  He denies dyspnea or exercise intolerance.  Prior to Admission medications   Medication Sig Start Date End Date Taking? Authorizing Provider  amLODipine (NORVASC) 5 MG tablet Take 1 tablet (5 mg total) by mouth daily. 06/01/11 05/31/12 Yes Yehuda Savannah, MD  carvedilol (COREG) 25 MG tablet Take 1 tablet (25 mg total) by mouth 2 (two) times daily with a meal. 05/06/11 05/05/12 Yes Yehuda Savannah, MD  fexofenadine (ALLEGRA) 180 MG tablet Take 1 tablet (180 mg total) by mouth daily. 07/01/11 06/30/12 Yes Alycia Rossetti, MD  lisinopril-hydrochlorothiazide (PRINZIDE,ZESTORETIC) 20-12.5 MG per tablet Take 2 tablets by mouth daily. 08/03/11 08/02/12 Yes Yehuda Savannah, MD  Multiple Vitamin (MULITIVITAMIN WITH MINERALS) TABS Take 1 tablet by mouth daily.   Yes Historical Provider, MD  nitroGLYCERIN (NITROSTAT) 0.4 MG SL tablet Place 0.4 mg under the tongue every 5 (five) minutes as needed. For chest pains   Yes Historical Provider, MD  NON FORMULARY Take 3 tablets by mouth daily. Study Drug cholesterol   Yes Historical Provider, MD  pantoprazole (PROTONIX) 40 MG tablet Take 40 mg by mouth daily.  06/17/11  Yes Historical Provider, MD  warfarin (COUMADIN) 5 MG tablet Take 1 tablet (5 mg total) by mouth daily. 10/05/11  Yes Yehuda Savannah, MD   No Known Allergies    Past medical history, social history, and family history reviewed and updated.  ROS: Denies pedal edema, orthopnea, PND, lightheadedness or syncope.  No abdominal pain, nausea or emesis.  All of the systems reviewed and are negative.  PHYSICAL EXAM: BP 131/71  Pulse 82  Ht 6' (1.829 m)  Wt 90.266 kg (199 lb)  BMI 26.99 kg/m2  General-Well developed; no acute distress Body habitus-proportionate weight and height Neck-No JVD; no carotid bruits Lungs-clear lung fields; resonant to percussion; Inspiratory and expiratory rhonchi; Mildly prolonged expiratory phase Cardiovascular-normal PMI; normal S1 and S2; irregular rhythm Abdomen-normal bowel sounds; soft and non-tender without masses or organomegaly Musculoskeletal-No deformities, no cyanosis or clubbing Neurologic-Normal cranial nerves; symmetric strength and tone Skin-Warm, no significant lesions Extremities-distal pulses intact; no edema  EKG: Atrial fibrillation with a controlled ventricular response; leftward axis; previous inferior myocardial infarction.  No previous tracing available for comparison.  ASSESSMENT AND PLAN:  Jacqulyn Ducking, MD 11/12/2011 2:09 PM

## 2011-11-12 NOTE — Assessment & Plan Note (Signed)
No symptoms referable to chronic atrial fibrillation.  Heart rate appears to be under excellent control.

## 2011-11-12 NOTE — Assessment & Plan Note (Addendum)
Excellent control of hyperlipidemia with current therapy except for mild hypertriglyceridemia in the absence of diabetes.  No additional therapy is necessary, But the addition of fish oil to his medical regime is recommended.

## 2011-11-12 NOTE — Patient Instructions (Addendum)
Your physician recommends that you have lab work today: BMP  Your physician recommends that you return for lab work in: 6 MONTHS (BMP)  Your physician wants you to follow-up in: 1 YEAR with Dr Lattie Haw.  You will receive a reminder letter in the mail two months in advance. If you don't receive a letter, please call our office to schedule the follow-up appointment.  Your physician has recommended you make the following change in your medication: START Fish Oil 1000mg  take one capsule by mouth twice a day

## 2011-11-12 NOTE — Progress Notes (Deleted)
**Note De-Identified Jannae Fagerstrom Obfuscation** Name: [name] [name]    DOB: [birth year]  Age: 61 y.o.  MR#: [id number]       PCP:  Vic Blackbird, MD      Insurance: @PAYORNAME @   CC:    Chief Complaint  Patient presents with  . Follow-up    VS BP 131/71  Pulse 82  Ht 6' (1.829 m)  Wt 199 lb (90.266 kg)  BMI 26.99 kg/m2  Weights Current Weight  11/12/11 199 lb (90.266 kg)  09/28/11 208 lb 6.4 oz (94.53 kg)  08/23/11 205 lb 1.3 oz (93.024 kg)    Blood Pressure  BP Readings from Last 3 Encounters:  11/12/11 131/71  09/28/11 120/80  08/23/11 122/80     Admit date:  (Not on file) Last encounter with RMR:  08/04/2011   Allergy No Known Allergies  Current Outpatient Prescriptions  Medication Sig Dispense Refill  . amLODipine (NORVASC) 5 MG tablet Take 1 tablet (5 mg total) by mouth daily.  30 tablet  12  . carvedilol (COREG) 25 MG tablet Take 1 tablet (25 mg total) by mouth 2 (two) times daily with a meal.  180 tablet  3  . fexofenadine (ALLEGRA) 180 MG tablet Take 1 tablet (180 mg total) by mouth daily.  30 tablet  2  . lisinopril-hydrochlorothiazide (PRINZIDE,ZESTORETIC) 20-12.5 MG per tablet Take 2 tablets by mouth daily.  60 tablet  3  . Multiple Vitamin (MULITIVITAMIN WITH MINERALS) TABS Take 1 tablet by mouth daily.      . nitroGLYCERIN (NITROSTAT) 0.4 MG SL tablet Place 0.4 mg under the tongue every 5 (five) minutes as needed. For chest pains      . NON FORMULARY Take 3 tablets by mouth daily. Study Drug cholesterol      . pantoprazole (PROTONIX) 40 MG tablet Take 40 mg by mouth daily.       [name] warfarin (COUMADIN) 5 MG tablet Take 1 tablet (5 mg total) by mouth daily.  60 tablet  0    Discontinued Meds:   There are no discontinued medications.  Patient Active Problem List  Diagnosis  . CARCINOMA, TESTES  . HYPERLIPIDEMIA  . HYPERTENSION  . ATRIAL FIBRILLATION  . ATHEROSCLEROTIC CARDIOVASCULAR DISEASE  . PERIPHERAL VASCULAR DISEASE  . NEPHROLITHIASIS  . Tobacco abuse, in remission  . Encounter for  long-term (current) use of anticoagulants  . Family history of colon cancer  . GERD (gastroesophageal reflux disease)  . Esophageal dysphagia  . Seasonal allergies  . Decreased visual acuity  . Stye, left  . CKD (chronic kidney disease)    LABS Anti-coag visit on 10/21/2011  Component Date Value  . INR 10/21/2011 2.3   Anti-coag visit on 09/23/2011  Component Date Value  . INR 09/23/2011 2.2   Anti-coag visit on 09/01/2011  Component Date Value  . INR 09/01/2011 1.6   Orders Only on 08/25/2011  Component Date Value  . OP 08/25/2011 No Ova or Parasites Seen    Office Visit on 08/23/2011  Component Date Value  . Sodium 08/23/2011 134*  . Potassium 08/23/2011 4.7   . Chloride 08/23/2011 96   . CO2 08/23/2011 30   . Glucose, Bld 08/23/2011 84   . BUN 08/23/2011 21   . Creat 08/23/2011 1.56*  . Total Bilirubin 08/23/2011 0.5   . Alkaline Phosphatase 08/23/2011 94   . AST 08/23/2011 20   . ALT 08/23/2011 24   . Total Protein 08/23/2011 7.4   . Albumin 08/23/2011 3.8   . Calcium 08/23/2011 10.5   . **Note De-Identified [name] [name] Obfuscation** WBC 08/23/2011 7.4   . RBC 08/23/2011 5.05   . Hemoglobin 08/23/2011 15.6   . HCT 08/23/2011 44.1   . MCV 08/23/2011 87.3   . [facility] 08/23/2011 30.9   . MCHC 08/23/2011 35.4   . RDW 08/23/2011 13.1   . Platelets 08/23/2011 167   . Organism ID, Bacteria 08/23/2011 No Salmonella,Shigella,Campylobacter or Yersinia   . Organism ID, Bacteria 08/23/2011 isolated.   . CDIFTX 08/23/2011 NEGATIVE   . Lipase 08/23/2011 76*     Results for this Opt Visit:     Results for orders placed in visit on 10/21/11  POCT INR      Component Value Range   INR 2.3      EKG Orders placed in visit on 07/03/10  . CONVERTED CEMR EKG     Prior Assessment and Plan Problem List as of 11/12/2011            Cardiology Problems   HYPERLIPIDEMIA   Last Assessment & Plan Note   07/01/2011 Office Visit Signed 07/01/2011  9:07 PM by Alycia Rossetti, MD    Elevated lipids however pt in research  trial    HYPERTENSION   Last Assessment & Plan Note   09/28/2011 Office Visit Addendum 09/28/2011  4:10 PM by Alycia Rossetti, MD    Blood pressure recheck much improved with increased dose of lisinopril HCTZ repeat metabolic panel     ATRIAL FIBRILLATION   Last Assessment & Plan Note   09/28/2011 Office Visit Signed 09/28/2011  4:11 PM by Alycia Rossetti, MD    No recent chest pain. He is being followed by Coumadin clinic    ATHEROSCLEROTIC CARDIOVASCULAR DISEASE   Last Assessment & Plan Note   09/28/2011 Office Visit Signed 09/28/2011  4:12 PM by Alycia Rossetti, MD    He is doing well. His LDL looks very good his triglycerides are high and we have discussed this regarding his diet    PERIPHERAL VASCULAR DISEASE   Last Assessment & Plan Note   08/20/2010 Office Visit Signed 08/20/2010  5:06 PM by Yehuda Savannah, MD    ABIs showed no significant obstruction of flow to the lower extremities; Leg discomfort apparently did not represent claudication and has resolved without specific therapy.      Other   CARCINOMA, TESTES   Last Assessment & Plan Note   05/20/2011 Office Visit Signed 05/23/2011  5:20 PM by Alycia Rossetti, MD    Remote history of testicular cancer,no current therapy    NEPHROLITHIASIS   Tobacco abuse, in remission   Encounter for long-term (current) use of anticoagulants   Family history of colon cancer   Last Assessment & Plan Note   07/05/2011 Office Visit Signed 07/05/2011  9:56 AM by Mahala Menghini, PA    Second attempt for colonoscopy. Patient misunderstood bowel prep last time so colonoscopy was incomplete secondary to solid stool beyond sigmoid colon. Add dulcolax 10mg  po daily for three days before procedure. Will hold coumadin four days before procedure like last time, previously approved by cardiologist. Patient to call if any questions about prep. Full detailed verbal explanation given today along with handout.    GERD (gastroesophageal reflux disease)   Last  Assessment & Plan Note   07/05/2011 Office Visit Signed 07/05/2011  9:57 AM by Mahala Menghini, PA    Doing well now on pantoprazole. Very little breakthrough heartburn. Dysphagia gone s/p esophageal dilation.  OV in one year.    Esophageal **Note De-Identified [name] [name] Obfuscation** dysphagia   Last Assessment & Plan Note   06/01/2011 Office Visit Signed 06/01/2011 10:11 AM by Mahala Menghini, PA    Chronic intermittent GERD for years, solid food esophageal dysphagia. Recommend EGD with dilation in the near future. Rule out Barrett's esophagus. Plan hold Coumadin for 4 days prior to procedure. We'll touch base with the Coumadin clinic to verify that this is okay.  I have discussed the risks, alternatives, benefits with regards to but not limited to the risk of reaction to medication, bleeding, infection, perforation and the patient is agreeable to proceed. Written consent to be obtained.     Seasonal allergies   Last Assessment & Plan Note   09/28/2011 Office Visit Signed 09/28/2011  4:10 PM by Alycia Rossetti, MD    He is to continue his Allegra.    Decreased visual acuity   Last Assessment & Plan Note   07/01/2011 Office Visit Signed 07/01/2011  9:12 PM by Alycia Rossetti, MD    optho referral    Stye, left   Last Assessment & Plan Note   09/28/2011 Office Visit Signed 09/28/2011  4:09 PM by Alycia Rossetti, MD    Warm compresses to left eye the swelling has gone down significantly.    CKD (chronic kidney disease)   Last Assessment & Plan Note   09/28/2011 Office Visit Signed 09/28/2011  4:11 PM by Alycia Rossetti, MD    He has some chronic kidney disease from his hypertension. His last set of labs showed an improvement in his BUN and creatinine        Imaging: No results found.   FRS Calculation: Score not calculated. Missing: Total Cholesterol

## 2011-11-12 NOTE — Assessment & Plan Note (Signed)
Hypertension is finally well controlled.  Current medications will be continued.

## 2011-11-12 NOTE — Assessment & Plan Note (Addendum)
Mild chronic renal insufficiency.  In the face of treatment with potentially nephrotoxic medications; we will monitor renal function every few months.

## 2011-11-12 NOTE — Assessment & Plan Note (Signed)
Stable and therapeutic anticoagulation without adverse effects.  We will continue to monitor for possible occult GI blood loss.

## 2011-11-12 NOTE — Assessment & Plan Note (Signed)
Patient is stable with respect to coronary artery disease with no current symptoms to suggest myocardial ischemia.  We will continue to attempt to optimally control risk factors.

## 2011-11-13 ENCOUNTER — Encounter: Payer: Self-pay | Admitting: Cardiology

## 2011-11-13 LAB — BASIC METABOLIC PANEL
BUN: 46 mg/dL — ABNORMAL HIGH (ref 6–23)
CO2: 20 mEq/L (ref 19–32)
Calcium: 9.1 mg/dL (ref 8.4–10.5)
Chloride: 111 mEq/L (ref 96–112)
Creat: 2.01 mg/dL — ABNORMAL HIGH (ref 0.50–1.35)
Glucose, Bld: 82 mg/dL (ref 70–99)
Potassium: 4.9 mEq/L (ref 3.5–5.3)
Sodium: 141 mEq/L (ref 135–145)

## 2011-11-15 ENCOUNTER — Encounter: Payer: Self-pay | Admitting: *Deleted

## 2011-11-15 ENCOUNTER — Other Ambulatory Visit: Payer: Self-pay | Admitting: *Deleted

## 2011-11-15 DIAGNOSIS — I10 Essential (primary) hypertension: Secondary | ICD-10-CM

## 2011-11-15 MED ORDER — CLONIDINE HCL 0.1 MG PO TABS: 0.1000 mg | ORAL_TABLET | Freq: Two times a day (BID) | ORAL | Status: DC

## 2011-11-15 MED ORDER — LISINOPRIL-HYDROCHLOROTHIAZIDE 20-12.5 MG PO TABS: 1.0000 | ORAL_TABLET | Freq: Every day | ORAL | Status: DC

## 2011-11-18 ENCOUNTER — Ambulatory Visit (INDEPENDENT_AMBULATORY_CARE_PROVIDER_SITE_OTHER): Payer: Medicare Other | Admitting: *Deleted

## 2011-11-18 DIAGNOSIS — I4891 Unspecified atrial fibrillation: Secondary | ICD-10-CM

## 2011-11-18 DIAGNOSIS — Z7901 Long term (current) use of anticoagulants: Secondary | ICD-10-CM

## 2011-11-18 LAB — POCT INR: INR: 2.7

## 2011-12-02 ENCOUNTER — Other Ambulatory Visit: Payer: Self-pay | Admitting: Cardiology

## 2011-12-21 ENCOUNTER — Encounter (INDEPENDENT_AMBULATORY_CARE_PROVIDER_SITE_OTHER): Payer: Medicare Other

## 2011-12-21 DIAGNOSIS — R0989 Other specified symptoms and signs involving the circulatory and respiratory systems: Secondary | ICD-10-CM

## 2011-12-29 ENCOUNTER — Ambulatory Visit: Payer: Medicare Other | Admitting: Family Medicine

## 2011-12-30 ENCOUNTER — Ambulatory Visit (INDEPENDENT_AMBULATORY_CARE_PROVIDER_SITE_OTHER): Payer: Medicare Other | Admitting: *Deleted

## 2011-12-30 DIAGNOSIS — I4891 Unspecified atrial fibrillation: Secondary | ICD-10-CM

## 2011-12-30 DIAGNOSIS — Z7901 Long term (current) use of anticoagulants: Secondary | ICD-10-CM

## 2011-12-30 LAB — POCT INR: INR: 2

## 2012-02-01 ENCOUNTER — Other Ambulatory Visit: Payer: Self-pay | Admitting: Cardiology

## 2012-02-10 ENCOUNTER — Ambulatory Visit (INDEPENDENT_AMBULATORY_CARE_PROVIDER_SITE_OTHER): Payer: Medicare Other | Admitting: *Deleted

## 2012-02-10 DIAGNOSIS — Z7901 Long term (current) use of anticoagulants: Secondary | ICD-10-CM

## 2012-02-10 DIAGNOSIS — I4891 Unspecified atrial fibrillation: Secondary | ICD-10-CM

## 2012-02-10 LAB — POCT INR: INR: 2

## 2012-03-23 ENCOUNTER — Ambulatory Visit (INDEPENDENT_AMBULATORY_CARE_PROVIDER_SITE_OTHER): Payer: Medicare Other | Admitting: *Deleted

## 2012-03-23 DIAGNOSIS — I4891 Unspecified atrial fibrillation: Secondary | ICD-10-CM

## 2012-03-23 DIAGNOSIS — Z7901 Long term (current) use of anticoagulants: Secondary | ICD-10-CM

## 2012-03-23 LAB — POCT INR: INR: 1.9

## 2012-04-17 ENCOUNTER — Ambulatory Visit: Payer: Medicare Other | Admitting: Family Medicine

## 2012-05-08 ENCOUNTER — Ambulatory Visit (INDEPENDENT_AMBULATORY_CARE_PROVIDER_SITE_OTHER): Payer: Medicare Other | Admitting: *Deleted

## 2012-05-08 DIAGNOSIS — Z7901 Long term (current) use of anticoagulants: Secondary | ICD-10-CM

## 2012-05-08 DIAGNOSIS — I4891 Unspecified atrial fibrillation: Secondary | ICD-10-CM

## 2012-05-08 LAB — POCT INR: INR: 1.5

## 2012-05-22 ENCOUNTER — Ambulatory Visit (INDEPENDENT_AMBULATORY_CARE_PROVIDER_SITE_OTHER): Payer: Medicare Other | Admitting: *Deleted

## 2012-05-22 DIAGNOSIS — I4891 Unspecified atrial fibrillation: Secondary | ICD-10-CM

## 2012-05-22 DIAGNOSIS — Z7901 Long term (current) use of anticoagulants: Secondary | ICD-10-CM

## 2012-05-22 LAB — POCT INR: INR: 1.8

## 2012-05-27 ENCOUNTER — Other Ambulatory Visit: Payer: Self-pay | Admitting: Cardiology

## 2012-06-04 ENCOUNTER — Other Ambulatory Visit: Payer: Self-pay | Admitting: Cardiology

## 2012-06-05 ENCOUNTER — Ambulatory Visit (INDEPENDENT_AMBULATORY_CARE_PROVIDER_SITE_OTHER): Payer: Medicare Other | Admitting: *Deleted

## 2012-06-05 DIAGNOSIS — I4891 Unspecified atrial fibrillation: Secondary | ICD-10-CM

## 2012-06-05 DIAGNOSIS — Z7901 Long term (current) use of anticoagulants: Secondary | ICD-10-CM

## 2012-06-05 LAB — POCT INR: INR: 3.6

## 2012-06-19 ENCOUNTER — Ambulatory Visit (INDEPENDENT_AMBULATORY_CARE_PROVIDER_SITE_OTHER): Payer: Medicare Other | Admitting: *Deleted

## 2012-06-19 DIAGNOSIS — I4891 Unspecified atrial fibrillation: Secondary | ICD-10-CM

## 2012-06-19 DIAGNOSIS — Z7901 Long term (current) use of anticoagulants: Secondary | ICD-10-CM

## 2012-06-19 LAB — POCT INR: INR: 4

## 2012-07-03 ENCOUNTER — Ambulatory Visit (INDEPENDENT_AMBULATORY_CARE_PROVIDER_SITE_OTHER): Payer: Medicare Other | Admitting: *Deleted

## 2012-07-03 DIAGNOSIS — Z7901 Long term (current) use of anticoagulants: Secondary | ICD-10-CM

## 2012-07-03 DIAGNOSIS — I4891 Unspecified atrial fibrillation: Secondary | ICD-10-CM

## 2012-07-03 LAB — POCT INR: INR: 2.9

## 2012-07-13 ENCOUNTER — Telehealth: Payer: Self-pay | Admitting: *Deleted

## 2012-07-13 MED ORDER — LISINOPRIL-HYDROCHLOROTHIAZIDE 20-12.5 MG PO TABS: 1.0000 | ORAL_TABLET | Freq: Every day | ORAL | Status: DC

## 2012-07-13 NOTE — Telephone Encounter (Signed)
Noted incoming fax for pt to receive refills for 90 day supply for lisinopril-hctz 20-12.5, MD RR gave verbal order to send 90 day supply for pt via escribe

## 2012-08-07 ENCOUNTER — Ambulatory Visit (INDEPENDENT_AMBULATORY_CARE_PROVIDER_SITE_OTHER): Payer: Medicare Other | Admitting: *Deleted

## 2012-08-07 DIAGNOSIS — I4891 Unspecified atrial fibrillation: Secondary | ICD-10-CM

## 2012-08-07 DIAGNOSIS — Z7901 Long term (current) use of anticoagulants: Secondary | ICD-10-CM

## 2012-08-07 LAB — POCT INR: INR: 3.3

## 2012-08-28 ENCOUNTER — Ambulatory Visit (INDEPENDENT_AMBULATORY_CARE_PROVIDER_SITE_OTHER): Payer: Medicare Other | Admitting: *Deleted

## 2012-08-28 DIAGNOSIS — Z7901 Long term (current) use of anticoagulants: Secondary | ICD-10-CM

## 2012-08-28 DIAGNOSIS — I4891 Unspecified atrial fibrillation: Secondary | ICD-10-CM

## 2012-08-28 LAB — POCT INR: INR: 3

## 2012-09-14 ENCOUNTER — Other Ambulatory Visit: Payer: Self-pay | Admitting: Cardiology

## 2012-09-14 NOTE — Telephone Encounter (Signed)
Please refill.

## 2012-10-11 ENCOUNTER — Ambulatory Visit (INDEPENDENT_AMBULATORY_CARE_PROVIDER_SITE_OTHER): Payer: Medicare Other | Admitting: *Deleted

## 2012-10-11 DIAGNOSIS — I4891 Unspecified atrial fibrillation: Secondary | ICD-10-CM

## 2012-10-11 DIAGNOSIS — Z7901 Long term (current) use of anticoagulants: Secondary | ICD-10-CM

## 2012-10-11 LAB — POCT INR: INR: 1.9

## 2012-10-27 ENCOUNTER — Encounter (INDEPENDENT_AMBULATORY_CARE_PROVIDER_SITE_OTHER): Payer: Medicare Other

## 2012-10-27 ENCOUNTER — Telehealth: Payer: Self-pay | Admitting: *Deleted

## 2012-10-27 DIAGNOSIS — R0989 Other specified symptoms and signs involving the circulatory and respiratory systems: Secondary | ICD-10-CM

## 2012-10-27 NOTE — Telephone Encounter (Signed)
Received incoming call from Jake Bathe with the [facility] research dept, advised this pt has completed the cholesterol studies recently and wanted to know if DR RR would place this pt on 40mg  of simvastatin per cost to pt, if so advise pt requested this go to [facility] in [place], please advise

## 2012-11-02 ENCOUNTER — Ambulatory Visit (INDEPENDENT_AMBULATORY_CARE_PROVIDER_SITE_OTHER): Payer: Medicare Other | Admitting: *Deleted

## 2012-11-02 DIAGNOSIS — I4891 Unspecified atrial fibrillation: Secondary | ICD-10-CM

## 2012-11-02 DIAGNOSIS — Z7901 Long term (current) use of anticoagulants: Secondary | ICD-10-CM

## 2012-11-02 LAB — POCT INR: INR: 1.5

## 2012-11-16 ENCOUNTER — Ambulatory Visit (INDEPENDENT_AMBULATORY_CARE_PROVIDER_SITE_OTHER): Payer: Medicare Other | Admitting: *Deleted

## 2012-11-16 DIAGNOSIS — I4891 Unspecified atrial fibrillation: Secondary | ICD-10-CM

## 2012-11-16 DIAGNOSIS — Z7901 Long term (current) use of anticoagulants: Secondary | ICD-10-CM

## 2012-11-16 LAB — POCT INR: INR: 1.7

## 2012-11-16 NOTE — Telephone Encounter (Signed)
.  left message to have patient return my call.  

## 2012-11-23 NOTE — Telephone Encounter (Signed)
FYI: noted pt has scheduled OV for [facility] NP on 12-06-12, Dr RR sent notation for the pt to discuss lipid lowering therapy at time of f/u per suggestion from [place] below

## 2012-12-02 ENCOUNTER — Other Ambulatory Visit: Payer: Self-pay | Admitting: Cardiology

## 2012-12-04 ENCOUNTER — Ambulatory Visit: Payer: Medicare Other | Admitting: Adult Health

## 2012-12-04 NOTE — Telephone Encounter (Signed)
Please refill.

## 2012-12-06 ENCOUNTER — Ambulatory Visit (INDEPENDENT_AMBULATORY_CARE_PROVIDER_SITE_OTHER): Payer: Medicare Other | Admitting: Adult Health

## 2012-12-06 ENCOUNTER — Ambulatory Visit (INDEPENDENT_AMBULATORY_CARE_PROVIDER_SITE_OTHER): Payer: Medicare Other | Admitting: *Deleted

## 2012-12-06 ENCOUNTER — Encounter: Payer: Self-pay | Admitting: Adult Health

## 2012-12-06 VITALS — BP 163/99 | HR 99 | Ht 72.0 in | Wt 198.8 lb

## 2012-12-06 DIAGNOSIS — Z7901 Long term (current) use of anticoagulants: Secondary | ICD-10-CM

## 2012-12-06 DIAGNOSIS — I10 Essential (primary) hypertension: Secondary | ICD-10-CM

## 2012-12-06 DIAGNOSIS — I4891 Unspecified atrial fibrillation: Secondary | ICD-10-CM

## 2012-12-06 DIAGNOSIS — I2510 Atherosclerotic heart disease of native coronary artery without angina pectoris: Secondary | ICD-10-CM

## 2012-12-06 DIAGNOSIS — E7800 Pure hypercholesterolemia, unspecified: Secondary | ICD-10-CM

## 2012-12-06 LAB — POCT INR: INR: 2.6

## 2012-12-06 MED ORDER — CLONIDINE HCL 0.1 MG PO TABS: 0.1000 mg | ORAL_TABLET | Freq: Two times a day (BID) | ORAL | Status: DC

## 2012-12-06 MED ORDER — LISINOPRIL-HYDROCHLOROTHIAZIDE 20-12.5 MG PO TABS: 1.0000 | ORAL_TABLET | Freq: Every day | ORAL | Status: DC

## 2012-12-06 MED ORDER — WARFARIN SODIUM 5 MG PO TABS: ORAL_TABLET | ORAL | Status: DC

## 2012-12-06 MED ORDER — AMLODIPINE BESYLATE 5 MG PO TABS: ORAL_TABLET | ORAL | Status: DC

## 2012-12-06 NOTE — Progress Notes (Deleted)
Name: [name] [name]    DOB: 10-Jan-1951  Age: 62 y.o.  MR#: [id number]       PCP:  Vic Blackbird, MD      Insurance: Payor: [employer] MEDICARE / Plan: AARP MEDICARE COMPLETE / Product Type: *No Product type* /   CC:    Chief Complaint  Patient presents with  . Atrial Fibrillation  . Coronary Artery Disease    VS Filed Vitals:   12/06/12 1108  BP: 163/99  Pulse: 99  Height: 6' (1.829 m)  Weight: 198 lb 12 oz (90.152 kg)    Weights Current Weight  12/06/12 198 lb 12 oz (90.152 kg)  11/12/11 199 lb (90.266 kg)  09/28/11 208 lb 6.4 oz (94.53 kg)    Blood Pressure  BP Readings from Last 3 Encounters:  12/06/12 163/99  11/12/11 131/71  09/28/11 120/80     Admit date:  (Not on file) Last encounter with RMR:  12/04/2012   Allergy Review of patient's allergies indicates no known allergies.  Current Outpatient Prescriptions  Medication Sig Dispense Refill  . amLODipine (NORVASC) 5 MG tablet take 1 tablet by mouth once daily  30 tablet  6  . lisinopril-hydrochlorothiazide (PRINZIDE,ZESTORETIC) 20-12.5 MG per tablet Take 1 tablet by mouth daily.  90 tablet  1  . Multiple Vitamin (MULITIVITAMIN WITH MINERALS) TABS Take 1 tablet by mouth daily.      . nitroGLYCERIN (NITROSTAT) 0.4 MG SL tablet Place 0.4 mg under the tongue every 5 (five) minutes as needed. For chest pains      . NON FORMULARY Take 3 tablets by mouth daily. Study Drug cholesterol      . Omega-3 Fatty Acids (FISH OIL) 1000 MG CAPS Take 1 capsule (1,000 mg total) by mouth 2 (two) times daily.    0  . pantoprazole (PROTONIX) 40 MG tablet take 1 tablet by mouth once daily  30 tablet  6  . warfarin (COUMADIN) 5 MG tablet take 1 tablet by mouth once daily  30 tablet  3  . cloNIDine (CATAPRES) 0.1 MG tablet Take 1 tablet (0.1 mg total) by mouth 2 (two) times daily.  60 tablet  11  . fexofenadine (ALLEGRA) 180 MG tablet Take 1 tablet (180 mg total) by mouth daily.  30 tablet  2   No current facility-administered  medications for this visit.    Discontinued Meds:   There are no discontinued medications.  Patient Active Problem List   Diagnosis Date Noted  . Chronic kidney disease 09/28/2011  . Seasonal allergies 07/01/2011  . Decreased visual acuity 07/01/2011  . GERD (gastroesophageal reflux disease) 06/01/2011  . Esophageal dysphagia 06/01/2011  . Family history of colon cancer 05/20/2011  . Chronic anticoagulation 09/24/2010  . Tobacco abuse, in remission   . Atrial fibrillation 11/13/2009  . ATHEROSCLEROTIC CARDIOVASCULAR DISEASE 11/13/2009  . PERIPHERAL VASCULAR DISEASE 11/13/2009  . Hyperlipidemia 10/24/2008  . Testicular cancer 10/22/2008  . Hypertension 10/22/2008  . NEPHROLITHIASIS 10/22/2008    LABS    Component Value Date/Time   NA 141 11/12/2011 1438   NA 134* 08/23/2011 1443   NA 137 05/20/2011 1019   K 4.9 11/12/2011 1438   K 4.7 08/23/2011 1443   K 5.1 05/20/2011 1019   CL 111 11/12/2011 1438   CL 96 08/23/2011 1443   CL 104 05/20/2011 1019   CO2 20 11/12/2011 1438   CO2 30 08/23/2011 1443   CO2 28 05/20/2011 1019   GLUCOSE 82 11/12/2011 1438   GLUCOSE 84  08/23/2011 1443   GLUCOSE 96 05/20/2011 1019   BUN 46* 11/12/2011 1438   BUN 21 08/23/2011 1443   BUN 31* 05/20/2011 1019   CREATININE 2.01* 11/12/2011 1438   CREATININE 1.56* 08/23/2011 1443   CREATININE 1.70* 05/20/2011 1019   CREATININE 1.45 06/05/2010 0530   CREATININE 1.39 06/04/2010 1355   CREATININE 1.47 06/04/2010 0540   CALCIUM 9.1 11/12/2011 1438   CALCIUM 10.5 08/23/2011 1443   CALCIUM 9.2 05/20/2011 1019   GFRNONAA 50* 06/05/2010 0530   GFRNONAA 53* 06/04/2010 1355   GFRNONAA 49* 06/04/2010 0540   GFRAA  Value: >60        The eGFR has been calculated using the MDRD equation. This calculation has not been validated in all clinical situations. eGFR's persistently <60 mL/min signify possible Chronic Kidney Disease. 06/05/2010 0530   GFRAA  Value: >60        The eGFR has been calculated using the MDRD equation. This calculation has  not been validated in all clinical situations. eGFR's persistently <60 mL/min signify possible Chronic Kidney Disease. 06/04/2010 1355   GFRAA  Value: 60        The eGFR has been calculated using the MDRD equation. This calculation has not been validated in all clinical situations. eGFR's persistently <60 mL/min signify possible Chronic Kidney Disease.* 06/04/2010 0540   CMP     Component Value Date/Time   NA 141 11/12/2011 1438   K 4.9 11/12/2011 1438   CL 111 11/12/2011 1438   CO2 20 11/12/2011 1438   GLUCOSE 82 11/12/2011 1438   BUN 46* 11/12/2011 1438   CREATININE 2.01* 11/12/2011 1438   CREATININE 1.45 06/05/2010 0530   CALCIUM 9.1 11/12/2011 1438   PROT 7.4 08/23/2011 1443   ALBUMIN 3.8 08/23/2011 1443   AST 20 08/23/2011 1443   ALT 24 08/23/2011 1443   ALKPHOS 94 08/23/2011 1443   BILITOT 0.5 08/23/2011 1443   GFRNONAA 50* 06/05/2010 0530   GFRAA  Value: >60        The eGFR has been calculated using the MDRD equation. This calculation has not been validated in all clinical situations. eGFR's persistently <60 mL/min signify possible Chronic Kidney Disease. 06/05/2010 0530       Component Value Date/Time   WBC 7.4 08/23/2011 1443   WBC 7.1 05/20/2011 1019   WBC 7.9 06/05/2010 0530   HGB 15.6 08/23/2011 1443   HGB 16.2 05/20/2011 1019   HGB 17.0 06/05/2010 0530   HCT 44.1 08/23/2011 1443   HCT 47.0 05/20/2011 1019   HCT 49.0 06/05/2010 0530   MCV 87.3 08/23/2011 1443   MCV 89.5 05/20/2011 1019   MCV 87.0 06/05/2010 0530    Lipid Panel     Component Value Date/Time   CHOL 126 05/20/2011 1019   TRIG 285* 05/20/2011 1019   HDL 30* 05/20/2011 1019   CHOLHDL 4.2 05/20/2011 1019   VLDL 57* 05/20/2011 1019   LDLCALC 39 05/20/2011 1019    ABG    Component Value Date/Time   TCO2 30 06/16/2009 1602     Lab Results  Component Value Date   TSH 0.492 05/20/2011   BNP (last 3 results) No results found for this basename: PROBNP,  in the last 8760 hours Cardiac Panel (last 3 results) No results found for this  basename: CKTOTAL, CKMB, TROPONINI, RELINDX,  in the last 72 hours  Iron/TIBC/Ferritin No results found for this basename: iron, tibc, ferritin     EKG Orders placed in visit on 12/06/12  .  EKG 12-LEAD     Prior Assessment and Plan Problem List as of 12/06/2012     Cardiovascular and Mediastinum   Hypertension   Last Assessment & Plan   11/12/2011 Office Visit Written 11/12/2011  2:32 PM by Yehuda Savannah, MD     Hypertension is finally well controlled.  Current medications will be continued.    Atrial fibrillation   Last Assessment & Plan   11/12/2011 Office Visit Written 11/12/2011  2:27 PM by Yehuda Savannah, MD     No symptoms referable to chronic atrial fibrillation.  Heart rate appears to be under excellent control.    ATHEROSCLEROTIC CARDIOVASCULAR DISEASE   Last Assessment & Plan   11/12/2011 Office Visit Written 11/12/2011  2:26 PM by Yehuda Savannah, MD     Patient is stable with respect to coronary artery disease with no current symptoms to suggest myocardial ischemia.  We will continue to attempt to optimally control risk factors.    PERIPHERAL VASCULAR DISEASE   Last Assessment & Plan   08/20/2010 Office Visit Written 08/20/2010  5:06 PM by Yehuda Savannah, MD     ABIs showed no significant obstruction of flow to the lower extremities; Leg discomfort apparently did not represent claudication and has resolved without specific therapy.      Respiratory   Seasonal allergies   Last Assessment & Plan   09/28/2011 Office Visit Written 09/28/2011  4:10 PM by Alycia Rossetti, MD     He is to continue his Allegra.      Digestive   GERD (gastroesophageal reflux disease)   Last Assessment & Plan   07/05/2011 Office Visit Written 07/05/2011  9:57 AM by Mahala Menghini, PA     Doing well now on pantoprazole. Very little breakthrough heartburn. Dysphagia gone s/p esophageal dilation.  OV in one year.    Esophageal dysphagia   Last Assessment & Plan   06/01/2011 Office Visit  Written 06/01/2011 10:11 AM by Mahala Menghini, PA     Chronic intermittent GERD for years, solid food esophageal dysphagia. Recommend EGD with dilation in the near future. Rule out Barrett's esophagus. Plan hold Coumadin for 4 days prior to procedure. We'll touch base with the Coumadin clinic to verify that this is okay.  I have discussed the risks, alternatives, benefits with regards to but not limited to the risk of reaction to medication, bleeding, infection, perforation and the patient is agreeable to proceed. Written consent to be obtained.       Genitourinary   Testicular cancer   Last Assessment & Plan   05/20/2011 Office Visit Written 05/23/2011  5:20 PM by Alycia Rossetti, MD     Remote history of testicular cancer,no current therapy    NEPHROLITHIASIS   Chronic kidney disease   Last Assessment & Plan   11/12/2011 Office Visit Edited 11/14/2011 11:18 AM by Yehuda Savannah, MD     Mild chronic renal insufficiency.  In the face of treatment with potentially nephrotoxic medications; we will monitor renal function every few months.      Other   Hyperlipidemia   Last Assessment & Plan   11/12/2011 Office Visit Edited 11/14/2011 11:19 AM by Yehuda Savannah, MD     Excellent control of hyperlipidemia with current therapy except for mild hypertriglyceridemia in the absence of diabetes.  No additional therapy is necessary, But the addition of fish oil to his medical regime is recommended.     Tobacco abuse, in remission  Chronic anticoagulation   Last Assessment & Plan   11/12/2011 Office Visit Written 11/12/2011  2:28 PM by Yehuda Savannah, MD     Stable and therapeutic anticoagulation without adverse effects.  We will continue to monitor for possible occult GI blood loss.    Family history of colon cancer   Last Assessment & Plan   07/05/2011 Office Visit Written 07/05/2011  9:56 AM by Mahala Menghini, PA     Second attempt for colonoscopy. Patient misunderstood bowel prep last time so  colonoscopy was incomplete secondary to solid stool beyond sigmoid colon. Add dulcolax 10mg  po daily for three days before procedure. Will hold coumadin four days before procedure like last time, previously approved by cardiologist. Patient to call if any questions about prep. Full detailed verbal explanation given today along with handout.    Decreased visual acuity   Last Assessment & Plan   07/01/2011 Office Visit Written 07/01/2011  9:12 PM by Alycia Rossetti, MD     optho referral        Imaging: No results found.

## 2012-12-06 NOTE — Assessment & Plan Note (Signed)
Not well controlled at present. He has not taken his clonidine yet today. i have rechecked the BP in the office manually and this correlates with initial reading.I have explained that it is a BID drug and should be taken first thing in the morning and then 12 hours later. He states that he usually takes it at noon, I will not make any changes at this time. I have ordered labs to check renal function.

## 2012-12-06 NOTE — Assessment & Plan Note (Signed)
Heart rate is not well controlled at this time. He has not taken clonidine yet today. This may be contributing. Will hold off on adding additional rate control medications unless he remains tachycardic on follow up appts. Continue coumadin as directed.

## 2012-12-06 NOTE — Assessment & Plan Note (Signed)
He offers no cardiac complaints or need to take NTG. Will continue risk management. Check lipids

## 2012-12-06 NOTE — Patient Instructions (Addendum)
Your physician recommends that you schedule a follow-up appointment in: 1 year You will receive a reminder letter in the mail in about 10 months reminding you to call and schedule your appointment. If you don't receive this letter, please contact our office.  Your physician recommends that you return for lab work in: CMET, LIPID  Make sure to take you CLONIDINE first thing in the morning.

## 2012-12-06 NOTE — Progress Notes (Signed)
HPI: [name] [name] is a 62 year old patient of Dr. Lattie Haw we are following for ongoing assessment and management of CAD, chronic atrial fibrillation requiring anticoagulation, hypertension, and hyperlipidemia. The patient was last seen in the office on 11/14/2011 by Dr. Lattie Haw. On that visit the patient was doing very well without new complaints or diagnoses. Heart rate was well-controlled. He was continued on anticoagulation with Coumadin.   He comes today for annual evaluation and coumadin refills. He is without complaints of chest pain,m dizziness or DOE. Denies heart racing or palpitations.,   No Known Allergies  Current Outpatient Prescriptions  Medication Sig Dispense Refill  . amLODipine (NORVASC) 5 MG tablet take 1 tablet by mouth once daily  30 tablet  6  . lisinopril-hydrochlorothiazide (PRINZIDE,ZESTORETIC) 20-12.5 MG per tablet Take 1 tablet by mouth daily.  90 tablet  1  . Multiple Vitamin (MULITIVITAMIN WITH MINERALS) TABS Take 1 tablet by mouth daily.      . nitroGLYCERIN (NITROSTAT) 0.4 MG SL tablet Place 0.4 mg under the tongue every 5 (five) minutes as needed. For chest pains      . NON FORMULARY Take 3 tablets by mouth daily. Study Drug cholesterol      . Omega-3 Fatty Acids (FISH OIL) 1000 MG CAPS Take 1 capsule (1,000 mg total) by mouth 2 (two) times daily.    0  . pantoprazole (PROTONIX) 40 MG tablet take 1 tablet by mouth once daily  30 tablet  6  . warfarin (COUMADIN) 5 MG tablet take 1 tablet by mouth once daily  30 tablet  3  . cloNIDine (CATAPRES) 0.1 MG tablet Take 1 tablet (0.1 mg total) by mouth 2 (two) times daily.  60 tablet  11  . fexofenadine (ALLEGRA) 180 MG tablet Take 1 tablet (180 mg total) by mouth daily.  30 tablet  2   No current facility-administered medications for this visit.    Past Medical History  Diagnosis Date  . Arteriosclerotic cardiovascular disease (ASCVD)     AMI in 2000 treated at [facility]; cath in 12/2006->  Chronic total obstruction  of the RCA;  drug-eluting stent placed in M1; inferior hypokinesis with an EF of 45%  . Tobacco abuse, in remission     20 pack years; quit in 2009  . Hypertension   . Chronic anticoagulation   . Atrial fibrillation     on coumadin for couple of years, stopped plavix at that time  . PVD (peripheral vascular disease)     Ct angiogram in 2009 revealed stable disease with 80% celiac stenosis,50% right renal artery ,ASCVD with ulceration in the abdominal aortashe  . Nephrolithiasis   . Cerebrovascular disease 2002    carotid stent  . Hyperlipidemia   . Myocardial infarction 10 yrs ago  . Dysphagia   . Testicular carcinoma 1990    right orchiectomy    Past Surgical History  Procedure Laterality Date  . Appendectomy  2004  . Testicular cancer  1990    right orchiectomy  . Colonoscopy  06/17/2011    INCOMPLETE, PREP POOR. Procedure: COLONOSCOPY;  Surgeon: Daneil Dolin, MD;  Location: [facility];  Service: Endoscopy;  Laterality: N/A;  10:00  . Esophagogastroduodenoscopy  06/17/2011    severe erosive/ulcerative reflux esophagitis, soft noncritical stricture dilatied, small hh, antral erosion   . Colonoscopy  07/15/2011    Procedure: COLONOSCOPY;  Surgeon: Daneil Dolin, MD;  Location: [facility];  Service: Endoscopy;  Laterality: N/A;  10:30    [id number]  of systems complete and found to be negative unless listed above  PHYSICAL EXAM BP 163/99  Pulse 99  Ht 6' (1.829 m)  Wt 198 lb 12 oz (90.152 kg)  BMI 26.95 kg/m2 General: Well developed, well nourished, in no acute distress Head: Eyes PERRLA, No xanthomas.   Normal cephalic and atramatic  Lungs: Clear bilaterally to auscultation and percussion. Heart: HRIR S1 S2,tachycardic without MRG.  Pulses are 1+ & equal.            No carotid bruit. No JVD.   Abdomen: Bowel sounds are positive, abdomen soft and non-tender without masses or                  Hernia's noted. Msk:  Back normal, normal gait. Normal strength and tone  for age. Extremities: No clubbing, cyanosis or edema.  DP +1 Neuro: Alert and oriented X 3. Psych:  Good affect, responds appropriately  [id number] fibrillation rate of 105 bpm. Inferior Q-waves.  ASSESSMENT AND PLAN

## 2012-12-11 LAB — COMPREHENSIVE METABOLIC PANEL
ALT: 13 U/L (ref 0–53)
AST: 15 U/L (ref 0–37)
Albumin: 3.8 g/dL (ref 3.5–5.2)
Alkaline Phosphatase: 74 U/L (ref 39–117)
BUN: 16 mg/dL (ref 6–23)
CO2: 27 mEq/L (ref 19–32)
Calcium: 9.7 mg/dL (ref 8.4–10.5)
Chloride: 104 mEq/L (ref 96–112)
Creat: 1.23 mg/dL (ref 0.50–1.35)
Glucose, Bld: 94 mg/dL (ref 70–99)
Potassium: 4.5 mEq/L (ref 3.5–5.3)
Sodium: 136 mEq/L (ref 135–145)
Total Bilirubin: 0.9 mg/dL (ref 0.3–1.2)
Total Protein: 6.7 g/dL (ref 6.0–8.3)

## 2012-12-11 LAB — LIPID PANEL
Cholesterol: 172 mg/dL (ref 0–200)
HDL: 29 mg/dL — ABNORMAL LOW (ref >39)
LDL Cholesterol: 66 mg/dL (ref 0–99)
Total CHOL/HDL Ratio: 5.9 Ratio
Triglycerides: 383 mg/dL — ABNORMAL HIGH (ref <150)
VLDL: 77 mg/dL — ABNORMAL HIGH (ref 0–40)

## 2012-12-12 ENCOUNTER — Other Ambulatory Visit: Payer: Self-pay | Admitting: *Deleted

## 2012-12-12 ENCOUNTER — Encounter: Payer: Self-pay | Admitting: *Deleted

## 2012-12-12 DIAGNOSIS — E785 Hyperlipidemia, unspecified: Secondary | ICD-10-CM

## 2012-12-12 DIAGNOSIS — I739 Peripheral vascular disease, unspecified: Secondary | ICD-10-CM

## 2012-12-12 DIAGNOSIS — I4891 Unspecified atrial fibrillation: Secondary | ICD-10-CM

## 2012-12-12 DIAGNOSIS — I10 Essential (primary) hypertension: Secondary | ICD-10-CM

## 2012-12-12 DIAGNOSIS — I2510 Atherosclerotic heart disease of native coronary artery without angina pectoris: Secondary | ICD-10-CM

## 2012-12-12 MED ORDER — AMLODIPINE BESYLATE 10 MG PO TABS: ORAL_TABLET | ORAL | Status: DC

## 2012-12-12 MED ORDER — LISINOPRIL 20 MG PO TABS: 20.0000 mg | ORAL_TABLET | Freq: Every day | ORAL | Status: DC

## 2012-12-19 ENCOUNTER — Ambulatory Visit (INDEPENDENT_AMBULATORY_CARE_PROVIDER_SITE_OTHER): Payer: Medicare Other | Admitting: *Deleted

## 2012-12-19 VITALS — BP 140/78 | HR 84 | Ht 72.0 in | Wt 198.8 lb

## 2012-12-19 DIAGNOSIS — I10 Essential (primary) hypertension: Secondary | ICD-10-CM

## 2012-12-19 NOTE — Patient Instructions (Signed)
Your physician recommends that you schedule a follow-up appointment in: TO BE DETERMINED   

## 2012-12-19 NOTE — Progress Notes (Signed)
Pt present for a repeat BP check after OV BP of 163/99 on 12/06/2012.  Pt states he is taking all medication as prescribed. Pt states he feels fine. Denies lightheadedness/sob/swelling.  BP is 140/78  HR 84 today.  Please advise if any changes need to be made.

## 2012-12-22 ENCOUNTER — Telehealth: Payer: Self-pay | Admitting: *Deleted

## 2012-12-22 MED ORDER — WARFARIN SODIUM 5 MG PO TABS: ORAL_TABLET | ORAL | Status: DC

## 2012-12-22 NOTE — Telephone Encounter (Signed)
Received call from [place] with [facility] in [place] to advise pt coumadin was increased to one and a half a day for 5mg  and pt needs new refill to last, gave verbal phone in for #45 with 3 refills, pt aware per in pharmacy currently

## 2013-01-03 ENCOUNTER — Ambulatory Visit (INDEPENDENT_AMBULATORY_CARE_PROVIDER_SITE_OTHER): Payer: Medicare Other | Admitting: *Deleted

## 2013-01-03 DIAGNOSIS — I4891 Unspecified atrial fibrillation: Secondary | ICD-10-CM

## 2013-01-03 DIAGNOSIS — Z7901 Long term (current) use of anticoagulants: Secondary | ICD-10-CM

## 2013-01-03 LAB — POCT INR: INR: 3.2

## 2013-01-09 ENCOUNTER — Other Ambulatory Visit: Payer: Self-pay | Admitting: *Deleted

## 2013-01-09 ENCOUNTER — Encounter: Payer: Self-pay | Admitting: *Deleted

## 2013-01-09 DIAGNOSIS — I10 Essential (primary) hypertension: Secondary | ICD-10-CM

## 2013-01-09 DIAGNOSIS — I739 Peripheral vascular disease, unspecified: Secondary | ICD-10-CM

## 2013-01-09 DIAGNOSIS — E785 Hyperlipidemia, unspecified: Secondary | ICD-10-CM

## 2013-01-09 DIAGNOSIS — I2510 Atherosclerotic heart disease of native coronary artery without angina pectoris: Secondary | ICD-10-CM

## 2013-01-09 DIAGNOSIS — I4891 Unspecified atrial fibrillation: Secondary | ICD-10-CM

## 2013-02-05 ENCOUNTER — Ambulatory Visit (INDEPENDENT_AMBULATORY_CARE_PROVIDER_SITE_OTHER): Payer: Medicare Other | Admitting: *Deleted

## 2013-02-05 DIAGNOSIS — Z7901 Long term (current) use of anticoagulants: Secondary | ICD-10-CM

## 2013-02-05 DIAGNOSIS — I4891 Unspecified atrial fibrillation: Secondary | ICD-10-CM

## 2013-02-05 LAB — POCT INR: INR: 1.5

## 2013-02-22 ENCOUNTER — Ambulatory Visit (INDEPENDENT_AMBULATORY_CARE_PROVIDER_SITE_OTHER): Payer: Medicare Other | Admitting: *Deleted

## 2013-02-22 DIAGNOSIS — Z7901 Long term (current) use of anticoagulants: Secondary | ICD-10-CM

## 2013-02-22 DIAGNOSIS — I4891 Unspecified atrial fibrillation: Secondary | ICD-10-CM

## 2013-02-22 LAB — POCT INR: INR: 3

## 2013-03-22 ENCOUNTER — Encounter: Payer: Self-pay | Admitting: *Deleted

## 2013-04-04 ENCOUNTER — Ambulatory Visit (INDEPENDENT_AMBULATORY_CARE_PROVIDER_SITE_OTHER): Payer: Medicare Other | Admitting: *Deleted

## 2013-04-04 DIAGNOSIS — Z7901 Long term (current) use of anticoagulants: Secondary | ICD-10-CM

## 2013-04-04 DIAGNOSIS — I4891 Unspecified atrial fibrillation: Secondary | ICD-10-CM

## 2013-04-04 LAB — POCT INR: INR: 3.1

## 2013-05-09 ENCOUNTER — Encounter: Payer: Self-pay | Admitting: *Deleted

## 2013-05-14 ENCOUNTER — Encounter: Payer: Self-pay | Admitting: Family Medicine

## 2013-05-14 ENCOUNTER — Ambulatory Visit (INDEPENDENT_AMBULATORY_CARE_PROVIDER_SITE_OTHER): Payer: Medicare Other | Admitting: Family Medicine

## 2013-05-14 VITALS — BP 118/80 | HR 82 | Temp 98.3°F | Resp 18 | Ht 72.0 in | Wt 200.0 lb

## 2013-05-14 DIAGNOSIS — I4891 Unspecified atrial fibrillation: Secondary | ICD-10-CM

## 2013-05-14 DIAGNOSIS — I10 Essential (primary) hypertension: Secondary | ICD-10-CM

## 2013-05-14 DIAGNOSIS — I2510 Atherosclerotic heart disease of native coronary artery without angina pectoris: Secondary | ICD-10-CM

## 2013-05-14 DIAGNOSIS — N189 Chronic kidney disease, unspecified: Secondary | ICD-10-CM

## 2013-05-14 DIAGNOSIS — K219 Gastro-esophageal reflux disease without esophagitis: Secondary | ICD-10-CM

## 2013-05-14 DIAGNOSIS — E785 Hyperlipidemia, unspecified: Secondary | ICD-10-CM

## 2013-05-14 LAB — COMPLETE METABOLIC PANEL WITH GFR
ALT: 11 U/L (ref 0–53)
AST: 13 U/L (ref 0–37)
Albumin: 4 g/dL (ref 3.5–5.2)
Alkaline Phosphatase: 78 U/L (ref 39–117)
BUN: 19 mg/dL (ref 6–23)
CO2: 26 mEq/L (ref 19–32)
Calcium: 9.4 mg/dL (ref 8.4–10.5)
Chloride: 104 mEq/L (ref 96–112)
Creat: 1.42 mg/dL — ABNORMAL HIGH (ref 0.50–1.35)
GFR, Est African American: 62 mL/min
GFR, Est Non African American: 53 mL/min — ABNORMAL LOW
Glucose, Bld: 92 mg/dL (ref 70–99)
Potassium: 4.9 mEq/L (ref 3.5–5.3)
Sodium: 142 mEq/L (ref 135–145)
Total Bilirubin: 0.4 mg/dL (ref 0.3–1.2)
Total Protein: 6.9 g/dL (ref 6.0–8.3)

## 2013-05-14 LAB — CBC
HCT: 45.3 % (ref 39.0–52.0)
Hemoglobin: 16 g/dL (ref 13.0–17.0)
MCH: 29.7 pg (ref 26.0–34.0)
MCHC: 35.3 g/dL (ref 30.0–36.0)
MCV: 84.2 fL (ref 78.0–100.0)
Platelets: 166 10*3/uL (ref 150–400)
RBC: 5.38 MIL/uL (ref 4.22–5.81)
RDW: 14.5 % (ref 11.5–15.5)
WBC: 6.9 10*3/uL (ref 4.0–10.5)

## 2013-05-14 LAB — LIPID PANEL
Cholesterol: 203 mg/dL — ABNORMAL HIGH (ref 0–200)
HDL: 33 mg/dL — ABNORMAL LOW (ref >39)
LDL Cholesterol: 91 mg/dL (ref 0–99)
Total CHOL/HDL Ratio: 6.2 Ratio
Triglycerides: 396 mg/dL — ABNORMAL HIGH (ref <150)
VLDL: 79 mg/dL — ABNORMAL HIGH (ref 0–40)

## 2013-05-14 MED ORDER — PANTOPRAZOLE SODIUM 40 MG PO TBEC: 40.0000 mg | DELAYED_RELEASE_TABLET | Freq: Every day | ORAL | Status: DC

## 2013-05-14 MED ORDER — LISINOPRIL 20 MG PO TABS: 20.0000 mg | ORAL_TABLET | Freq: Every day | ORAL | Status: DC

## 2013-05-14 NOTE — Assessment & Plan Note (Signed)
Recheck lipid panel. He is taking fish oil for his hypertriglycerides

## 2013-05-14 NOTE — Assessment & Plan Note (Signed)
Recheck renal function he does have some underlying CK D.

## 2013-05-14 NOTE — Assessment & Plan Note (Signed)
Currently rate controlled and on Coumadin therapy

## 2013-05-14 NOTE — Assessment & Plan Note (Signed)
Followed by cardiology has not had any chest pain and use of nitroglycerin

## 2013-05-14 NOTE — Assessment & Plan Note (Signed)
Blood pressure is well-controlled continue current medications  

## 2013-05-14 NOTE — Assessment & Plan Note (Signed)
Continue proton X. medications refill

## 2013-05-14 NOTE — Progress Notes (Signed)
   Subjective:    Patient ID: [name] [name], male    DOB: 01/17/1951, 63 y.o.   MRN: [id number]  HPI  Patient here to follow chronic medical problems. His last appointment was May of 2013. He has been seen by cardiology back in August of 2014. His history of coronary artery disease with a remote MI he also has a history of atrial fibrillation currently on Coumadin therapy. At her last visit I was also following hypertension as well as chronic kidney disease. He has no specific concerns today. He will return for a physical exam with prostate check His medications were reviewed all his medications were correct except he still had a polyp lisinopril HCTZ which he states he was taking but this was discontinued by cardiology due to his renal disease. That means  he was taking 40 mg of lisinopril and HCTZ 12.5   Review of Systems  GEN- denies fatigue, fever, weight loss,weakness, recent illness HEENT- denies eye drainage, change in vision, nasal discharge, CVS- denies chest pain, palpitations RESP- denies SOB, cough, wheeze ABD- denies N/V, change in stools, abd pain MSK- denies joint pain, muscle aches, injury Neuro- denies headache, dizziness, syncope, seizure activity      Objective:   Physical Exam  GEN- NAD, alert and oriented x3 HEENT- PERRL, EOMI, non injected sclera, pink conjunctiva, MMM, oropharynx clear Neck- Supple, no bruit CVS- irregular rhythm, normal rate, no murmur RESP-CTAB ABD-NABS,soft,NT,ND EXT- No edema Pulses- Radial 2+        Assessment & Plan:

## 2013-05-14 NOTE — Patient Instructions (Signed)
Continue current medications We will call with lab results and medications F/U 4 Weeks for Physical

## 2013-05-15 MED ORDER — ATORVASTATIN CALCIUM 10 MG PO TABS: 10.0000 mg | ORAL_TABLET | Freq: Every day | ORAL | Status: DC

## 2013-05-15 NOTE — Addendum Note (Signed)
Addended by: Vic Blackbird F on: 05/15/2013 02:27 PM   Modules accepted: Orders

## 2013-05-17 ENCOUNTER — Other Ambulatory Visit: Payer: Self-pay | Admitting: Cardiology

## 2013-05-18 ENCOUNTER — Telehealth: Payer: Self-pay | Admitting: Family Medicine

## 2013-05-18 NOTE — Telephone Encounter (Signed)
Pt is returning missed call  Call back number is [phone]

## 2013-05-18 NOTE — Telephone Encounter (Signed)
Returned call pt aware of results

## 2013-05-23 ENCOUNTER — Ambulatory Visit (INDEPENDENT_AMBULATORY_CARE_PROVIDER_SITE_OTHER): Payer: Medicare Other | Admitting: *Deleted

## 2013-05-23 DIAGNOSIS — I4891 Unspecified atrial fibrillation: Secondary | ICD-10-CM

## 2013-05-23 DIAGNOSIS — Z7901 Long term (current) use of anticoagulants: Secondary | ICD-10-CM

## 2013-05-23 LAB — POCT INR: INR: 2.7

## 2013-06-15 ENCOUNTER — Ambulatory Visit (INDEPENDENT_AMBULATORY_CARE_PROVIDER_SITE_OTHER): Payer: Medicare Other | Admitting: Family Medicine

## 2013-06-15 ENCOUNTER — Encounter: Payer: Self-pay | Admitting: Family Medicine

## 2013-06-15 VITALS — BP 128/78 | HR 76 | Temp 97.8°F | Resp 18 | Ht 72.0 in | Wt 195.0 lb

## 2013-06-15 DIAGNOSIS — Z0000 Encounter for general adult medical examination without abnormal findings: Secondary | ICD-10-CM

## 2013-06-15 DIAGNOSIS — Z8547 Personal history of malignant neoplasm of testis: Secondary | ICD-10-CM

## 2013-06-15 DIAGNOSIS — N529 Male erectile dysfunction, unspecified: Secondary | ICD-10-CM

## 2013-06-15 DIAGNOSIS — N508 Other specified disorders of male genital organs: Secondary | ICD-10-CM

## 2013-06-15 DIAGNOSIS — N5089 Other specified disorders of the male genital organs: Secondary | ICD-10-CM | POA: Insufficient documentation

## 2013-06-15 DIAGNOSIS — Z125 Encounter for screening for malignant neoplasm of prostate: Secondary | ICD-10-CM

## 2013-06-15 LAB — PSA, MEDICARE: PSA: 1.12 ng/mL (ref <=4.00)

## 2013-06-15 NOTE — Patient Instructions (Signed)
We will call with lab results Look into shingles vaccine at your pharmacy Ultrasound to be set up F/u 3 months

## 2013-06-16 LAB — TESTOSTERONE: Testosterone: 338 ng/dL (ref 300–890)

## 2013-06-17 DIAGNOSIS — N529 Male erectile dysfunction, unspecified: Secondary | ICD-10-CM | POA: Insufficient documentation

## 2013-06-17 NOTE — Progress Notes (Signed)
Patient ID: [name] [name], male   DOB: 29-Mar-1951, 63 y.o.   MRN: [id number] Subjective:   Patient presents for Medicare Annual/Subsequent preventive examination.    Patient here for physical exam. His medications and history were reviewed. He is concerned about erectile dysfunction which he's had for the past couple of years. In the past he was on Viagra which didn't help. He denies any fatigue but has difficulty getting his erections and sustaining. He does have history of testicular cancer in his right testicle was removed.   Review Past Medical/Family/Social: per EMR   Risk Factors  Current exercise habits: walks Dietary issues discussed:   Cardiac risk factors: CAD  Depression Screen  (Note: if answer to either of the following is "Yes", a more complete depression screening is indicated)  Over the past two weeks, have you felt down, depressed or hopeless? No Over the past two weeks, have you felt little interest or pleasure in doing things? No Have you lost interest or pleasure in daily life? No Do you often feel hopeless? No Do you cry easily over simple problems? No   Activities of Daily Living  In your present state of health, do you have any difficulty performing the following activities?:  Driving? No  Managing money? No  Feeding yourself? No  Getting from bed to chair? No  Climbing a flight of stairs? No  Preparing food and eating?: No  Bathing or showering? No  Getting dressed: No  Getting to the toilet? No  Using the toilet:No  Moving around from place to place: No  In the past year have you fallen or had a near fall?:No  Are you sexually active? No  Do you have more than one partner? No   Hearing Difficulties: No  Do you often ask people to speak up or repeat themselves? No  Do you experience ringing or noises in your ears? No Do you have difficulty understanding soft or whispered voices? No  Do you feel that you have a problem with memory? No Do you often  misplace items? No  Do you feel safe at home? Yes  Cognitive Testing  Alert? Yes Normal Appearance?Yes  Oriented to person? Yes Place? Yes  Time? Yes  Recall of three objects? Yes  Can perform simple calculations? Yes  Displays appropriate judgment?Yes  Can read the correct time from a watch face?Yes   List the Names of Other Physician/Practitioners you currently use: Cardiology   Screening Tests / Date Colonoscopy      -UTD               Zostavax  Influenza Vaccine  UTD Tetanus/tdap UTD  ROS: GEN- denies fatigue, fever, weight loss,weakness, recent illness HEENT- denies eye drainage, change in vision, nasal discharge, CVS- denies chest pain, palpitations RESP- denies SOB, cough, wheeze ABD- denies N/V, change in stools, abd pain GU- denies dysuria, hematuria, dribbling, incontinence MSK- denies joint pain, muscle aches, injury Neuro- denies headache, dizziness, syncope, seizure activity        Physical Exam:  GEN- NAD, alert and oriented x3 HEENT- PERRL, EOMI, non injected sclera, pink conjunctiva, MMM, oropharynx clear Neck- Supple, no bruit CVS- irregular rhythm, normal rate, no murmur RESP-CTAB ABD-NABS,soft,NT,ND GU: Rectum- normal external appearence FOBT, neg, prostate smooth, mild enlargement , no nodules, penis no lesions, scrotum- small mass protruding from bottom of right scrotum EXT- No edema Pulses- Radial 2+   Assessment:    Annual wellness medicare exam   Plan:  During the course of the visit the patient was educated and counseled about appropriate screening and preventive services including:    Shingles vaccine. Prescription given to that she can get the vaccine at the pharmacy or Medicare part D.  Screen NEG for depression.  Diet review for nutrition referral? Yes ____ Not Indicated __x__  Patient Instructions (the written plan) was given to the patient.  Medicare Attestation  I have personally reviewed:  The patient's medical and  social history  Their use of alcohol, tobacco or illicit drugs  Their current medications and supplements  The patient's functional ability including ADLs,fall risks, home safety risks, cognitive, and hearing and visual impairment  Diet and physical activities  Evidence for depression or mood disorders  The patient's weight, height, BMI, and visual acuity have been recorded in the chart. I have made referrals, counseling, and provided education to the patient based on review of the above and I have provided the patient with a written personalized care plan for preventive services.

## 2013-06-17 NOTE — Assessment & Plan Note (Signed)
Ultrasound to be done, as mass like lesion present for many years on the same side as his testicular cancer and has never been evaluated He has not had any pain from the region

## 2013-06-17 NOTE — Assessment & Plan Note (Signed)
Check Testosterone level and PSA

## 2013-06-20 ENCOUNTER — Other Ambulatory Visit: Payer: Self-pay | Admitting: Family Medicine

## 2013-06-20 ENCOUNTER — Ambulatory Visit (HOSPITAL_COMMUNITY)
Admission: RE | Admit: 2013-06-20 | Discharge: 2013-06-20 | Disposition: A | Discharge status: Home / Self Care | Payer: Medicare Other | Source: Ambulatory Visit | Attending: Family Medicine | Admitting: Family Medicine

## 2013-06-20 DIAGNOSIS — N5089 Other specified disorders of the male genital organs: Secondary | ICD-10-CM

## 2013-06-20 DIAGNOSIS — Z8547 Personal history of malignant neoplasm of testis: Secondary | ICD-10-CM

## 2013-06-20 DIAGNOSIS — N433 Hydrocele, unspecified: Secondary | ICD-10-CM | POA: Insufficient documentation

## 2013-06-20 DIAGNOSIS — N529 Male erectile dysfunction, unspecified: Secondary | ICD-10-CM

## 2013-06-20 DIAGNOSIS — N508 Other specified disorders of male genital organs: Secondary | ICD-10-CM | POA: Insufficient documentation

## 2013-07-05 ENCOUNTER — Encounter (INDEPENDENT_AMBULATORY_CARE_PROVIDER_SITE_OTHER): Payer: Self-pay

## 2013-07-05 ENCOUNTER — Ambulatory Visit (INDEPENDENT_AMBULATORY_CARE_PROVIDER_SITE_OTHER): Payer: Medicare Other | Admitting: *Deleted

## 2013-07-05 DIAGNOSIS — Z7901 Long term (current) use of anticoagulants: Secondary | ICD-10-CM

## 2013-07-05 DIAGNOSIS — Z5181 Encounter for therapeutic drug level monitoring: Secondary | ICD-10-CM | POA: Insufficient documentation

## 2013-07-05 DIAGNOSIS — I4891 Unspecified atrial fibrillation: Secondary | ICD-10-CM

## 2013-07-05 LAB — POCT INR: INR: 2.4

## 2013-07-12 ENCOUNTER — Telehealth: Payer: Self-pay | Admitting: *Deleted

## 2013-07-12 NOTE — Telephone Encounter (Signed)
Received call from Estill Bamberg, Horace with Dr. Louis Meckel at [facility].   Reported that patient is to have a cyst removed from testicle and requested to have MD approve anti-coagulation orders:  Stop Coumadin x 1 week prior to procedure and 1 week after procedure (total of 14 days).   Also would like to speak with MD as consult.   Call back number: [phone], [phone]   MD to be made aware.

## 2013-07-16 ENCOUNTER — Telehealth: Payer: Self-pay | Admitting: *Deleted

## 2013-07-16 NOTE — Telephone Encounter (Signed)
Received message that Estill Bamberg, RN has other questions in regards to patient.   Return call placed, left VM to return call.

## 2013-07-16 NOTE — Telephone Encounter (Signed)
Got message from Dr.Claire [place] that growth is a tumor with history of testicular cancer. I have addressed this with Dr. Buelah Manis. He can stop coumadin for 7 days prior to surgery.

## 2013-07-16 NOTE — Telephone Encounter (Signed)
Please advise 

## 2013-07-16 NOTE — Telephone Encounter (Signed)
Made pt aware

## 2013-07-16 NOTE — Telephone Encounter (Signed)
Called and left message with Amanda,regarding pt, will write letter

## 2013-07-16 NOTE — Telephone Encounter (Signed)
Skin growth on groin needs to be removed by Dr Jarrett Soho urologist, referred by Dr Buelah Manis.

## 2013-07-16 NOTE — Telephone Encounter (Signed)
;  pt is going to have a growth removed and they would like to stop his coumadin one week before and keep him off for a week after. Patient is calling to make sure this would be okay

## 2013-07-16 NOTE — Telephone Encounter (Signed)
Left message to call back  

## 2013-07-16 NOTE — Telephone Encounter (Signed)
Call returned from [place].   Made aware of MD orders.

## 2013-07-16 NOTE — Telephone Encounter (Signed)
Need more information on "growth removal." Skin or internal growth. Requiring surgery or dermatologist office visit. Cannot make recommendations until specifics are available.

## 2013-08-15 ENCOUNTER — Other Ambulatory Visit: Payer: Self-pay | Admitting: Cardiology

## 2013-08-15 ENCOUNTER — Other Ambulatory Visit: Payer: Self-pay | Admitting: *Deleted

## 2013-08-15 ENCOUNTER — Telehealth: Payer: Self-pay | Admitting: Adult Health

## 2013-08-15 MED ORDER — WARFARIN SODIUM 5 MG PO TABS: ORAL_TABLET | ORAL | Status: DC

## 2013-08-15 MED ORDER — AMLODIPINE BESYLATE 10 MG PO TABS: ORAL_TABLET | ORAL | Status: DC

## 2013-08-15 NOTE — Telephone Encounter (Signed)
Received fax refill request  Rx # [id number] Medication:  Amlodipine Besylate 10 mg tab Qty 30 Sig:  Take one tablet by mouth once daily Physician:  Purcell Nails

## 2013-08-16 ENCOUNTER — Encounter: Payer: Self-pay | Admitting: *Deleted

## 2013-09-06 ENCOUNTER — Ambulatory Visit (INDEPENDENT_AMBULATORY_CARE_PROVIDER_SITE_OTHER): Payer: Medicare Other | Admitting: *Deleted

## 2013-09-06 ENCOUNTER — Other Ambulatory Visit (HOSPITAL_COMMUNITY): Payer: Self-pay | Admitting: Cardiovascular Disease

## 2013-09-06 DIAGNOSIS — Z7901 Long term (current) use of anticoagulants: Secondary | ICD-10-CM

## 2013-09-06 DIAGNOSIS — I4891 Unspecified atrial fibrillation: Secondary | ICD-10-CM

## 2013-09-06 DIAGNOSIS — Z5181 Encounter for therapeutic drug level monitoring: Secondary | ICD-10-CM

## 2013-09-06 LAB — POCT INR: INR: 3.8

## 2013-09-14 ENCOUNTER — Ambulatory Visit: Payer: Medicare Other | Admitting: Family Medicine

## 2013-09-17 ENCOUNTER — Other Ambulatory Visit: Payer: Self-pay | Admitting: *Deleted

## 2013-09-17 MED ORDER — ATORVASTATIN CALCIUM 10 MG PO TABS: 10.0000 mg | ORAL_TABLET | Freq: Every day | ORAL | Status: DC

## 2013-09-17 NOTE — Telephone Encounter (Signed)
Refill appropriate and filled per protocol. 

## 2013-09-21 ENCOUNTER — Telehealth: Payer: Self-pay | Admitting: Family Medicine

## 2013-09-21 NOTE — Telephone Encounter (Signed)
Call placed to patient to confirm prescription needed.   [place].

## 2013-09-21 NOTE — Telephone Encounter (Signed)
[place] Pt is needing a refill on lipator he believes

## 2013-09-25 MED ORDER — ATORVASTATIN CALCIUM 10 MG PO TABS: 10.0000 mg | ORAL_TABLET | Freq: Every day | ORAL | Status: DC

## 2013-09-25 NOTE — Telephone Encounter (Signed)
LMTRC

## 2013-09-26 ENCOUNTER — Encounter: Payer: Self-pay | Admitting: Family Medicine

## 2013-09-26 ENCOUNTER — Ambulatory Visit (INDEPENDENT_AMBULATORY_CARE_PROVIDER_SITE_OTHER): Payer: Medicare Other | Admitting: Family Medicine

## 2013-09-26 VITALS — BP 148/84 | HR 76 | Temp 98.2°F | Resp 20 | Ht 69.5 in | Wt 195.0 lb

## 2013-09-26 DIAGNOSIS — E785 Hyperlipidemia, unspecified: Secondary | ICD-10-CM

## 2013-09-26 DIAGNOSIS — I4891 Unspecified atrial fibrillation: Secondary | ICD-10-CM

## 2013-09-26 DIAGNOSIS — I739 Peripheral vascular disease, unspecified: Secondary | ICD-10-CM

## 2013-09-26 DIAGNOSIS — I10 Essential (primary) hypertension: Secondary | ICD-10-CM

## 2013-09-26 DIAGNOSIS — N189 Chronic kidney disease, unspecified: Secondary | ICD-10-CM

## 2013-09-26 NOTE — Progress Notes (Signed)
Patient ID: SHAUNT GUNTRUM, male   DOB: Aug 11, 1950, 63 y.o.   MRN: [id number]   Subjective:    Patient ID: KI AKERMAN, male    DOB: August 23, 1950, 63 y.o.   MRN: [id number]  Patient presents for 3 month F/U  patient here to follow chronic medical problems. Her last visit he was sent to urology secondary to testicular mass this was found to be benign. They have decided to hold off on giving any medications for erectile dysfunction secondary to his when necessary use of nitroglycerin. He has no specific concerns today. He's due for repeat fasting labs secondary to hyperlipidemia he was started on Lipitor 10 mg at last visit.  Medications reviewed  Review Of Systems:  GEN- denies fatigue, fever, weight loss,weakness, recent illness HEENT- denies eye drainage, change in vision, nasal discharge, CVS- denies chest pain, palpitations RESP- denies SOB, cough, wheeze ABD- denies N/V, change in stools, abd pain GU- denies dysuria, hematuria, dribbling, incontinence MSK- denies joint pain, muscle aches, injury Neuro- denies headache, dizziness, syncope, seizure activity       Objective:    BP 148/84  Pulse 76  Temp(Src) 98.2 F (36.8 C) (Oral)  Resp 20  Ht 5' 9.5" (1.765 m)  Wt 195 lb (88.451 kg)  BMI 28.39 kg/m2 GEN- NAD, alert and oriented x3 HEENT- PERRL, EOMI, non injected sclera, pink conjunctiva, MMM, oropharynx clear Neck- Supple,  CVS- irregular rhythm, normal rate, no murmur RESP-CTAB EXT- No edema Pulses- Radial 2+        Assessment & Plan:      Problem List Items Addressed This Visit   Hypertension   Relevant Orders      CBC with Differential      Comprehensive metabolic panel   Hyperlipidemia - Primary   Relevant Orders      Lipid panel   Atrial fibrillation      Note: This dictation was prepared with Dragon dictation along with smaller phrase technology. Any transcriptional errors that result from this process are unintentional.

## 2013-09-26 NOTE — Patient Instructions (Signed)
Continue current medications Get your labs done fasting  F/U 4 months

## 2013-09-27 ENCOUNTER — Encounter: Payer: Self-pay | Admitting: *Deleted

## 2013-09-27 NOTE — Assessment & Plan Note (Signed)
His blood pressure is a little elevated today however is pressure has been under excellent control with his medications therefore will not making changes

## 2013-09-27 NOTE — Assessment & Plan Note (Signed)
No recent chest pain or shortness of breath he is on Coumadin therapy by cardiology

## 2013-09-27 NOTE — Assessment & Plan Note (Signed)
Recheck renal function. ?

## 2013-09-27 NOTE — Assessment & Plan Note (Signed)
Chest Lipitor as needed goals of LDL less than 100 with history of peripheral vascular disease coronary artery disease

## 2013-10-25 ENCOUNTER — Ambulatory Visit (INDEPENDENT_AMBULATORY_CARE_PROVIDER_SITE_OTHER): Payer: Medicare Other | Admitting: *Deleted

## 2013-10-25 DIAGNOSIS — I482 Chronic atrial fibrillation, unspecified: Secondary | ICD-10-CM

## 2013-10-25 DIAGNOSIS — Z5181 Encounter for therapeutic drug level monitoring: Secondary | ICD-10-CM

## 2013-10-25 DIAGNOSIS — I4891 Unspecified atrial fibrillation: Secondary | ICD-10-CM

## 2013-10-25 DIAGNOSIS — Z7901 Long term (current) use of anticoagulants: Secondary | ICD-10-CM

## 2013-10-25 LAB — POCT INR: INR: 2.1

## 2013-11-29 ENCOUNTER — Ambulatory Visit (INDEPENDENT_AMBULATORY_CARE_PROVIDER_SITE_OTHER): Payer: Medicare Other | Admitting: *Deleted

## 2013-11-29 DIAGNOSIS — I4891 Unspecified atrial fibrillation: Secondary | ICD-10-CM

## 2013-11-29 DIAGNOSIS — Z5181 Encounter for therapeutic drug level monitoring: Secondary | ICD-10-CM

## 2013-11-29 DIAGNOSIS — Z7901 Long term (current) use of anticoagulants: Secondary | ICD-10-CM

## 2013-11-29 LAB — POCT INR: INR: 2.1

## 2013-12-15 ENCOUNTER — Other Ambulatory Visit: Payer: Self-pay | Admitting: *Deleted

## 2013-12-15 MED ORDER — PANTOPRAZOLE SODIUM 40 MG PO TBEC: 40.0000 mg | DELAYED_RELEASE_TABLET | Freq: Every day | ORAL | Status: DC

## 2013-12-15 NOTE — Telephone Encounter (Signed)
Received fax requesting refill on Protonix.   Refill appropriate and filled per protocol.

## 2013-12-27 ENCOUNTER — Ambulatory Visit (INDEPENDENT_AMBULATORY_CARE_PROVIDER_SITE_OTHER): Payer: Medicare Other | Admitting: *Deleted

## 2013-12-27 DIAGNOSIS — Z7901 Long term (current) use of anticoagulants: Secondary | ICD-10-CM

## 2013-12-27 DIAGNOSIS — Z5181 Encounter for therapeutic drug level monitoring: Secondary | ICD-10-CM

## 2013-12-27 DIAGNOSIS — I4891 Unspecified atrial fibrillation: Secondary | ICD-10-CM

## 2013-12-27 LAB — POCT INR: INR: 2.3

## 2014-01-14 ENCOUNTER — Telehealth: Payer: Self-pay | Admitting: Adult Health

## 2014-01-14 MED ORDER — WARFARIN SODIUM 5 MG PO TABS: ORAL_TABLET | ORAL | Status: DC

## 2014-01-14 MED ORDER — CLONIDINE HCL 0.1 MG PO TABS: 0.1000 mg | ORAL_TABLET | Freq: Two times a day (BID) | ORAL | Status: DC

## 2014-01-14 NOTE — Telephone Encounter (Signed)
Medication sent to pharmacy.  Forwarded Warfarin to Edrick Oh, RN

## 2014-01-14 NOTE — Telephone Encounter (Signed)
Received fax refill request  Rx # [id number] Medication:  Clonidine HCL 0.1 mg tablet Qty 60 Sig:  Take one tablet by mouth twice a day Physician:  Purcell Nails Received fax refill request  Rx # [id number] Medication:  Warfarin Sodium 5 mg tablet Qty 45 Sig:  Take 1 and 1/2 tablets by mouth once daily EXCEPT take 1 tablet by mouth on Monday and Thursday Physician:  Purcell Nails

## 2014-01-14 NOTE — Addendum Note (Signed)
Addended by: Malen Gauze on: 01/14/2014 04:30 PM   Modules accepted: Orders

## 2014-01-28 ENCOUNTER — Ambulatory Visit: Payer: Medicare Other | Admitting: Family Medicine

## 2014-01-30 ENCOUNTER — Telehealth: Payer: Self-pay | Admitting: Adult Health

## 2014-01-30 MED ORDER — WARFARIN SODIUM 5 MG PO TABS: ORAL_TABLET | ORAL | Status: DC

## 2014-01-30 MED ORDER — CLONIDINE HCL 0.1 MG PO TABS: 0.1000 mg | ORAL_TABLET | Freq: Two times a day (BID) | ORAL | Status: DC

## 2014-01-30 NOTE — Telephone Encounter (Signed)
Received fax refill request  Rx # [id number] Medication:  Warfarin Sodium 5 mg tablet   Qty 45 Sig:  Take 1 and 1/2 tablets by mouth once daily EXCEPT take  1 tablet by mouth on Monday and Thursday  Physician:  Purcell Nails

## 2014-01-30 NOTE — Telephone Encounter (Signed)
Received fax refill request  Rx # [id number] Medication:  Clonidine HCL 0.1 mg tablet Qty 60 Sig:  Take one tablet by mouth twice a day Physician:  Purcell Nails

## 2014-02-06 ENCOUNTER — Ambulatory Visit: Payer: Medicare Other | Admitting: Family Medicine

## 2014-02-09 ENCOUNTER — Other Ambulatory Visit: Payer: Self-pay | Admitting: *Deleted

## 2014-02-09 MED ORDER — LISINOPRIL 20 MG PO TABS: 20.0000 mg | ORAL_TABLET | Freq: Every day | ORAL | Status: DC

## 2014-02-09 NOTE — Telephone Encounter (Signed)
Medication filled x1 with no refills.   Requires office visit before any further refills can be given.   Letter sent.  

## 2014-02-11 ENCOUNTER — Ambulatory Visit (INDEPENDENT_AMBULATORY_CARE_PROVIDER_SITE_OTHER): Payer: Medicare Other | Admitting: *Deleted

## 2014-02-11 DIAGNOSIS — Z5181 Encounter for therapeutic drug level monitoring: Secondary | ICD-10-CM

## 2014-02-11 DIAGNOSIS — Z7901 Long term (current) use of anticoagulants: Secondary | ICD-10-CM

## 2014-02-11 DIAGNOSIS — I4891 Unspecified atrial fibrillation: Secondary | ICD-10-CM

## 2014-02-11 LAB — POCT INR: INR: 1.9

## 2014-02-23 ENCOUNTER — Other Ambulatory Visit: Payer: Self-pay | Admitting: Adult Health

## 2014-03-27 ENCOUNTER — Ambulatory Visit (INDEPENDENT_AMBULATORY_CARE_PROVIDER_SITE_OTHER): Payer: Medicare Other | Admitting: *Deleted

## 2014-03-27 DIAGNOSIS — Z5181 Encounter for therapeutic drug level monitoring: Secondary | ICD-10-CM

## 2014-03-27 DIAGNOSIS — Z7901 Long term (current) use of anticoagulants: Secondary | ICD-10-CM

## 2014-03-27 DIAGNOSIS — I4891 Unspecified atrial fibrillation: Secondary | ICD-10-CM

## 2014-03-27 LAB — POCT INR: INR: 4.8

## 2014-04-17 ENCOUNTER — Ambulatory Visit (INDEPENDENT_AMBULATORY_CARE_PROVIDER_SITE_OTHER): Payer: Medicare Other | Admitting: *Deleted

## 2014-04-17 DIAGNOSIS — Z7901 Long term (current) use of anticoagulants: Secondary | ICD-10-CM

## 2014-04-17 DIAGNOSIS — Z5181 Encounter for therapeutic drug level monitoring: Secondary | ICD-10-CM

## 2014-04-17 DIAGNOSIS — I4891 Unspecified atrial fibrillation: Secondary | ICD-10-CM

## 2014-04-17 LAB — POCT INR: INR: 2

## 2014-05-20 ENCOUNTER — Ambulatory Visit (INDEPENDENT_AMBULATORY_CARE_PROVIDER_SITE_OTHER): Payer: 59 | Admitting: *Deleted

## 2014-05-20 DIAGNOSIS — I480 Paroxysmal atrial fibrillation: Secondary | ICD-10-CM

## 2014-05-20 DIAGNOSIS — Z7901 Long term (current) use of anticoagulants: Secondary | ICD-10-CM

## 2014-05-20 DIAGNOSIS — Z5181 Encounter for therapeutic drug level monitoring: Secondary | ICD-10-CM

## 2014-05-20 DIAGNOSIS — I4891 Unspecified atrial fibrillation: Secondary | ICD-10-CM

## 2014-05-20 LAB — POCT INR: INR: 1.8

## 2014-06-03 ENCOUNTER — Other Ambulatory Visit: Payer: Self-pay | Admitting: *Deleted

## 2014-06-03 MED ORDER — LISINOPRIL 20 MG PO TABS: 20.0000 mg | ORAL_TABLET | Freq: Every day | ORAL | Status: DC

## 2014-06-03 NOTE — Telephone Encounter (Signed)
Received fax requesting refill on Lisinopril.   Medication filled x1 with no refills.   Requires office visit before any further refills can be given.   Letter sent.

## 2014-06-04 ENCOUNTER — Telehealth: Payer: Self-pay | Admitting: *Deleted

## 2014-06-04 NOTE — Telephone Encounter (Signed)
Pt uses [facility] in [place] and needs use to call in two Rx for him, but he doesn't know what meds because he dropped them off at pharmacy. Pharmacy told them we need to call them

## 2014-06-10 ENCOUNTER — Encounter: Payer: Self-pay | Admitting: *Deleted

## 2014-07-01 ENCOUNTER — Ambulatory Visit (INDEPENDENT_AMBULATORY_CARE_PROVIDER_SITE_OTHER): Payer: 59 | Admitting: *Deleted

## 2014-07-01 DIAGNOSIS — Z5181 Encounter for therapeutic drug level monitoring: Secondary | ICD-10-CM

## 2014-07-01 DIAGNOSIS — I480 Paroxysmal atrial fibrillation: Secondary | ICD-10-CM

## 2014-07-01 LAB — POCT INR: INR: 2.5

## 2014-07-02 ENCOUNTER — Telehealth: Payer: Self-pay | Admitting: Family Medicine

## 2014-07-02 MED ORDER — PANTOPRAZOLE SODIUM 40 MG PO TBEC: 40.0000 mg | DELAYED_RELEASE_TABLET | Freq: Every day | ORAL | Status: DC

## 2014-07-02 NOTE — Telephone Encounter (Signed)
Patient is scheduled for CPE on 07/19/2014.  Medication filled x1 with no refills.   Requires office visit before any further refills can be given.

## 2014-07-02 NOTE — Telephone Encounter (Signed)
PT is needing a refill on his pantoprazole (PROTONIX) 40 MG tablet he is completely   [place]atar[place]

## 2014-07-04 ENCOUNTER — Encounter: Payer: Self-pay | Admitting: Internal Medicine

## 2014-07-16 ENCOUNTER — Ambulatory Visit (INDEPENDENT_AMBULATORY_CARE_PROVIDER_SITE_OTHER): Payer: Medicare Other | Admitting: Family Medicine

## 2014-07-16 ENCOUNTER — Encounter: Payer: Self-pay | Admitting: Family Medicine

## 2014-07-16 VITALS — BP 152/84 | HR 84 | Temp 98.9°F | Resp 20 | Ht 70.0 in | Wt 198.0 lb

## 2014-07-16 DIAGNOSIS — Z125 Encounter for screening for malignant neoplasm of prostate: Secondary | ICD-10-CM

## 2014-07-16 DIAGNOSIS — E785 Hyperlipidemia, unspecified: Secondary | ICD-10-CM | POA: Diagnosis not present

## 2014-07-16 DIAGNOSIS — K219 Gastro-esophageal reflux disease without esophagitis: Secondary | ICD-10-CM

## 2014-07-16 DIAGNOSIS — Z0000 Encounter for general adult medical examination without abnormal findings: Secondary | ICD-10-CM

## 2014-07-16 DIAGNOSIS — G4700 Insomnia, unspecified: Secondary | ICD-10-CM | POA: Insufficient documentation

## 2014-07-16 DIAGNOSIS — I10 Essential (primary) hypertension: Secondary | ICD-10-CM

## 2014-07-16 LAB — CBC WITH DIFFERENTIAL/PLATELET
Basophils Absolute: 0.1 10*3/uL (ref 0.0–0.1)
Basophils Relative: 1 % (ref 0–1)
Eosinophils Absolute: 0.1 10*3/uL (ref 0.0–0.7)
Eosinophils Relative: 1 % (ref 0–5)
HCT: 45.9 % (ref 39.0–52.0)
Hemoglobin: 15.8 g/dL (ref 13.0–17.0)
Lymphocytes Relative: 35 % (ref 12–46)
Lymphs Abs: 2.2 10*3/uL (ref 0.7–4.0)
MCH: 29.6 pg (ref 26.0–34.0)
MCHC: 34.4 g/dL (ref 30.0–36.0)
MCV: 86 fL (ref 78.0–100.0)
MPV: 9.5 fL (ref 8.6–12.4)
Monocytes Absolute: 0.6 10*3/uL (ref 0.1–1.0)
Monocytes Relative: 9 % (ref 3–12)
Neutro Abs: 3.4 10*3/uL (ref 1.7–7.7)
Neutrophils Relative %: 54 % (ref 43–77)
Platelets: 168 10*3/uL (ref 150–400)
RBC: 5.34 MIL/uL (ref 4.22–5.81)
RDW: 14.5 % (ref 11.5–15.5)
WBC: 6.3 10*3/uL (ref 4.0–10.5)

## 2014-07-16 LAB — COMPREHENSIVE METABOLIC PANEL
ALT: 13 U/L (ref 0–53)
AST: 13 U/L (ref 0–37)
Albumin: 3.9 g/dL (ref 3.5–5.2)
Alkaline Phosphatase: 84 U/L (ref 39–117)
BUN: 14 mg/dL (ref 6–23)
CO2: 26 mEq/L (ref 19–32)
Calcium: 9.6 mg/dL (ref 8.4–10.5)
Chloride: 103 mEq/L (ref 96–112)
Creat: 1.13 mg/dL (ref 0.50–1.35)
Glucose, Bld: 87 mg/dL (ref 70–99)
Potassium: 4.7 mEq/L (ref 3.5–5.3)
Sodium: 137 mEq/L (ref 135–145)
Total Bilirubin: 0.6 mg/dL (ref 0.2–1.2)
Total Protein: 6.8 g/dL (ref 6.0–8.3)

## 2014-07-16 LAB — LIPID PANEL
Cholesterol: 186 mg/dL (ref 0–200)
HDL: 28 mg/dL — ABNORMAL LOW (ref >=40)
LDL Cholesterol: 96 mg/dL (ref 0–99)
Total CHOL/HDL Ratio: 6.6 Ratio
Triglycerides: 309 mg/dL — ABNORMAL HIGH (ref <150)
VLDL: 62 mg/dL — ABNORMAL HIGH (ref 0–40)

## 2014-07-16 MED ORDER — LISINOPRIL 40 MG PO TABS: 40.0000 mg | ORAL_TABLET | Freq: Every day | ORAL | Status: DC

## 2014-07-16 MED ORDER — ATORVASTATIN CALCIUM 10 MG PO TABS: 10.0000 mg | ORAL_TABLET | Freq: Every day | ORAL | Status: DC

## 2014-07-16 MED ORDER — AMLODIPINE BESYLATE 10 MG PO TABS
ORAL_TABLET | ORAL | Status: DC
Start: 2014-07-16 — End: 2015-10-22

## 2014-07-16 MED ORDER — TRAZODONE HCL 50 MG PO TABS
25.0000 mg | ORAL_TABLET | Freq: Every evening | ORAL | Status: DC | PRN
Start: 2014-07-16 — End: 2015-09-19

## 2014-07-16 MED ORDER — PANTOPRAZOLE SODIUM 40 MG PO TBEC: 40.0000 mg | DELAYED_RELEASE_TABLET | Freq: Every day | ORAL | Status: DC

## 2014-07-16 MED ORDER — CLONIDINE HCL 0.1 MG PO TABS: 0.1000 mg | ORAL_TABLET | Freq: Two times a day (BID) | ORAL | Status: DC

## 2014-07-16 NOTE — Patient Instructions (Addendum)
I recommend eye visit once a year I recommend dental visit every 6 months Goal is to  Exercise 30 minutes 5 days a week We will send a letter with lab results  Try the sleeping pill Trazadone 1 hour before bedtime Call Dr. Gala Romney about colonoscopy Lisinopril increased to 40mg  F/U 6 weeks for blood pressure

## 2014-07-16 NOTE — Progress Notes (Signed)
Patient ID: [name] [name], male   DOB: 05/07/1950, 64 y.o.   MRN: [id number] Subjective:   Patient presents for Medicare Annual/Subsequent preventive examination.    Patient here for for annual exam. His only concern is he has difficulty falling asleep and staying asleep. He will often tossing and turning at night time and has been fatigued during the day. He has tried turning off the TV he does not drink caffeine at nighttime but still has difficulty sleeping.   he has not had any significant chest pain or shortness of breath. He is due for follow-up with his cardiologist  Review Past Medical/Family/Social:  Per EMR   Risk Factors  Current exercise habits:  walks Dietary issues discussed:  yes  Cardiac risk factors: Obesity (BMI >= 30 kg/m2)/  Hypertension hyperlipidemia  Depression Screen  (Note: if answer to either of the following is "Yes", a more complete depression screening is indicated)  Over the past two weeks, have you felt down, depressed or hopeless? No Over the past two weeks, have you felt little interest or pleasure in doing things? No Have you lost interest or pleasure in daily life? No Do you often feel hopeless? No Do you cry easily over simple problems? No   Activities of Daily Living  In your present state of health, do you have any difficulty performing the following activities?:  Driving? No  Managing money? No  Feeding yourself? No  Getting from bed to chair? No  Climbing a flight of stairs? No  Preparing food and eating?: No  Bathing or showering? No  Getting dressed: No  Getting to the toilet? No  Using the toilet:No  Moving around from place to place: No  In the past year have you fallen or had a near fall?:No  Are you sexually active? No  Do you have more than one partner? No   Hearing Difficulties: No  Do you often ask people to speak up or repeat themselves? No  Do you experience ringing or noises in your ears? No Do you have difficulty  understanding soft or whispered voices? No  Do you feel that you have a problem with memory? No Do you often misplace items? No  Do you feel safe at home? Yes  Cognitive Testing  Alert? Yes Normal Appearance?Yes  Oriented to person? Yes Place? Yes  Time? Yes  Recall of three objects? Yes  Can perform simple calculations? Yes  Displays appropriate judgment?Yes  Can read the correct time from a watch face?Yes   List the Names of Other Physician/Practitioners you currently use:  cardiology    Screening Tests / Date up-to-date per the EMR Colonoscopy                     Zostavax -  Unable to afford Influenza Vaccine  Tetanus/tdap  ROS: GEN- denies fatigue, fever, weight loss,weakness, recent illness HEENT- denies eye drainage, change in vision, nasal discharge, CVS- denies chest pain, palpitations RESP- denies SOB, cough, wheeze ABD- denies N/V, change in stools, abd pain GU- denies dysuria, hematuria, dribbling, incontinence MSK- denies joint pain, muscle aches, injury Neuro- denies headache, dizziness, syncope, seizure activity  PHYSICAL: GEN- NAD, alert and oriented x3 HEENT- PERRL, EOMI, non injected sclera, pink conjunctiva, MMM, oropharynx clear Neck- Supple, no thryomegaly CVS- irregular rhtyhem, normal rate, no murmur RESP-CTAB ABD-NABS,soft,NT,ND Rectum- normal tone, enlarged prostate, no nodule, FOBT, neg EXT- No edema Pulses- Radial, DP- 2+   Assessment:    Annual wellness medicare  exam   Plan:    During the course of the visit the patient was educated and counseled about appropriate screening and preventive services including:  Patient Instructions (the written plan) was given to the patient.  Medicare Attestation  I have personally reviewed:  The patient's medical and social history  Their use of alcohol, tobacco or illicit drugs  Their current medications and supplements  The patient's functional ability including ADLs,fall risks, home safety  risks, cognitive, and hearing and visual impairment  Diet and physical activities  Evidence for depression or mood disorders  The patient's weight, height, BMI, and visual acuity have been recorded in the chart. I have made referrals, counseling, and provided education to the patient based on review of the above and I have provided the patient with a written personalized care plan for preventive services.

## 2014-07-16 NOTE — Assessment & Plan Note (Signed)
Blood pressure still quite elevated I have not seen him in about a year and it was elevated at that time too. I'll increase his lisinopril to 40 mg to return in 6 weeks I will repeat his kidney function at that time

## 2014-07-16 NOTE — Assessment & Plan Note (Signed)
Trial of trazodone for his sleep

## 2014-07-17 LAB — PSA, MEDICARE: PSA: 1.74 ng/mL (ref <=4.00)

## 2014-07-22 ENCOUNTER — Other Ambulatory Visit: Payer: Self-pay | Admitting: *Deleted

## 2014-07-22 MED ORDER — ATORVASTATIN CALCIUM 20 MG PO TABS: 20.0000 mg | ORAL_TABLET | Freq: Every day | ORAL | Status: DC

## 2014-08-01 ENCOUNTER — Telehealth: Payer: Self-pay | Admitting: *Deleted

## 2014-08-01 NOTE — Telephone Encounter (Signed)
Pt's significant other called stating that she has been trying to call all morning and could not get thru but left message on vm, pt has a fever, body aches and states sometime he has hard time breathing, I suggested pt to be seen today but wants to come in on Friday(very adament), I scheduled pt for 12pm with [facility] on Friday Same day appt.

## 2014-08-02 ENCOUNTER — Encounter: Payer: Self-pay | Admitting: Family Medicine

## 2014-08-02 ENCOUNTER — Ambulatory Visit (INDEPENDENT_AMBULATORY_CARE_PROVIDER_SITE_OTHER): Payer: Medicare Other | Admitting: Family Medicine

## 2014-08-02 VITALS — BP 158/84 | HR 78 | Temp 101.8°F | Resp 16 | Ht 70.0 in | Wt 198.0 lb

## 2014-08-02 DIAGNOSIS — J189 Pneumonia, unspecified organism: Secondary | ICD-10-CM

## 2014-08-02 DIAGNOSIS — R5081 Fever presenting with conditions classified elsewhere: Secondary | ICD-10-CM

## 2014-08-02 LAB — INFLUENZA A AND B
Inflenza A Ag: NEGATIVE
Influenza B Ag: NEGATIVE

## 2014-08-02 MED ORDER — LEVOFLOXACIN 500 MG PO TABS: 500.0000 mg | ORAL_TABLET | Freq: Every day | ORAL | Status: DC

## 2014-08-02 MED ORDER — GUAIFENESIN-CODEINE 100-10 MG/5ML PO SOLN
5.0000 mL | Freq: Four times a day (QID) | ORAL | Status: DC | PRN
Start: 2014-08-02 — End: 2014-11-07

## 2014-08-02 NOTE — Patient Instructions (Addendum)
Take antibiotics as prescribed Plenty of rest Robitussin with codiene for body aches and cough Go to ER if you get worse F/U as previous

## 2014-08-02 NOTE — Progress Notes (Signed)
Patient ID: [name] [name], male   DOB: 10-28-1950, 64 y.o.   MRN: [id number]   Subjective:    Patient ID: [name] [name], male    DOB: [birth year], 64 y.o.   MRN: [id number]  Patient presents for Illness  patient here with fever with chills cough congestion that started 3 days ago. He states that hit them all at once. No known sick contacts. He has body aches all over and pain in his rib area from coughing so much. He is only able to get up a small amount of phlegm. He denies any chest pain. He does get short of breath when he is coughing a lot. He has been taking Robitussin over-the-counter. He denies any vomiting or diarrhea    Review Of Systems:  GEN- denies fatigue,+ fever, weight loss,weakness, recent illness HEENT- denies eye drainage, change in vision, nasal discharge, CVS- denies chest pain, palpitations RESP- denies SOB, +cough, +wheeze ABD- denies N/V, change in stools, abd pain GU- denies dysuria, hematuria, dribbling, incontinence MSK- denies joint pain, +muscle aches, injury Neuro- denies headache, dizziness, syncope, seizure activity       Objective:    BP 158/84 mmHg  Pulse 78  Temp(Src) 101.8 F (38.8 C) (Oral)  Resp 16  Ht 5\' 10"  (1.778 m)  Wt 198 lb (89.812 kg)  BMI 28.41 kg/m2  SpO2 96% GEN- NAD, alert and oriented x3,febrile, ill appearing HEENT- PERRL, EOMI, non injected sclera, pink conjunctiva, MMM, oropharynx clear Neck- Supple, no LAD CVS- irregular rhtyhem, normal rate, no murmur RESP slight-rales right chest near base, expiratory wheeze scatterd bilat R>L, normal WOB, sat 96% ABD-NABS,soft,NT,ND EXT- No edema Pulses- Radial  2+  Flu- NEgative       Assessment & Plan:      Problem List Items Addressed This Visit    None    Visit Diagnoses    Fever presenting with conditions classified elsewhere    -  Primary    Relevant Orders    Influenza a and b (Completed)    CAP (community acquired pneumonia)        Concern for  community-acquired pneumonia based on presentation his flu is negative. I'm going to treat him appear clean with Levaquin as well as a codeine-based cough syrup. He will go toER if he gets worse, sats stable for outpatient treatment. Acetaminophen 500mg  given in office for fever    Relevant Medications    levofloxacin (LEVAQUIN) tablet    Guaifenesin-codeine 100-10 mg/71mL po sol       Note: This dictation was prepared with Dragon dictation along with smaller phrase technology. Any transcriptional errors that result from this process are unintentional.

## 2014-08-27 ENCOUNTER — Ambulatory Visit: Payer: Medicare Other | Admitting: Family Medicine

## 2014-09-11 ENCOUNTER — Ambulatory Visit (INDEPENDENT_AMBULATORY_CARE_PROVIDER_SITE_OTHER): Payer: 59 | Admitting: *Deleted

## 2014-09-11 DIAGNOSIS — Z5181 Encounter for therapeutic drug level monitoring: Secondary | ICD-10-CM

## 2014-09-11 DIAGNOSIS — I4891 Unspecified atrial fibrillation: Secondary | ICD-10-CM

## 2014-09-11 DIAGNOSIS — I480 Paroxysmal atrial fibrillation: Secondary | ICD-10-CM | POA: Diagnosis not present

## 2014-09-11 LAB — POCT INR: INR: 2.9

## 2014-10-23 ENCOUNTER — Telehealth: Payer: Self-pay | Admitting: Adult Health

## 2014-10-23 MED ORDER — WARFARIN SODIUM 5 MG PO TABS: ORAL_TABLET | ORAL | Status: DC

## 2014-10-23 NOTE — Telephone Encounter (Signed)
warfarin (COUMADIN) 5 MG tablet out of medication   [facility] in [place]

## 2014-11-07 ENCOUNTER — Encounter: Payer: Self-pay | Admitting: Adult Health

## 2014-11-07 ENCOUNTER — Ambulatory Visit (INDEPENDENT_AMBULATORY_CARE_PROVIDER_SITE_OTHER): Payer: Medicare Other | Admitting: Adult Health

## 2014-11-07 VITALS — BP 148/88 | HR 70 | Ht 72.0 in | Wt 197.4 lb

## 2014-11-07 DIAGNOSIS — I4891 Unspecified atrial fibrillation: Secondary | ICD-10-CM | POA: Diagnosis not present

## 2014-11-07 DIAGNOSIS — I482 Chronic atrial fibrillation, unspecified: Secondary | ICD-10-CM

## 2014-11-07 DIAGNOSIS — I10 Essential (primary) hypertension: Secondary | ICD-10-CM

## 2014-11-07 DIAGNOSIS — E785 Hyperlipidemia, unspecified: Secondary | ICD-10-CM

## 2014-11-07 MED ORDER — WARFARIN SODIUM 5 MG PO TABS: ORAL_TABLET | ORAL | Status: DC

## 2014-11-07 MED ORDER — LISINOPRIL 40 MG PO TABS: 40.0000 mg | ORAL_TABLET | Freq: Every day | ORAL | Status: DC

## 2014-11-07 MED ORDER — CLONIDINE HCL 0.1 MG PO TABS: 0.1000 mg | ORAL_TABLET | Freq: Two times a day (BID) | ORAL | Status: DC

## 2014-11-07 NOTE — Progress Notes (Deleted)
Name: [name] [name]    DOB: 1951-01-10  Age: 64 y.o.  MR#: [id number]       PCP:  Vic Blackbird, MD      Insurance: Payor: [employer] MEDICARE / Plan: [facility] MEDICARE / Product Type: *No Product type* /   CC:    Chief Complaint  Patient presents with  . Coronary Artery Disease  . Atrial Fibrillation  . Hypertension    VS Filed Vitals:   11/07/14 1355  BP: 148/88  Pulse: 70  Height: 6' (1.829 m)  Weight: 197 lb 6.4 oz (89.54 kg)  SpO2: 97%    Weights Current Weight  11/07/14 197 lb 6.4 oz (89.54 kg)  08/02/14 198 lb (89.812 kg)  07/16/14 198 lb (89.812 kg)    Blood Pressure  BP Readings from Last 3 Encounters:  11/07/14 148/88  08/02/14 158/84  07/16/14 152/84     Admit date:  (Not on file) Last encounter with RMR:  10/23/2014   Allergy Review of patient's allergies indicates no known allergies.  Current Outpatient Prescriptions  Medication Sig Dispense Refill  . amLODipine (NORVASC) 10 MG tablet take 1 tablet by mouth once daily 90 tablet 3  . atorvastatin (LIPITOR) 20 MG tablet Take 1 tablet (20 mg total) by mouth at bedtime. 30 tablet 3  . cloNIDine (CATAPRES) 0.1 MG tablet Take 1 tablet (0.1 mg total) by mouth 2 (two) times daily. 180 tablet 1  . diphenhydrAMINE (BENADRYL) 25 MG tablet Take 25 mg by mouth daily as needed for allergies.    . fexofenadine (ALLEGRA) 180 MG tablet Take 1 tablet (180 mg total) by mouth daily. 30 tablet 2  . guaiFENesin-codeine 100-10 MG/5ML syrup Take 5 mLs by mouth every 6 (six) hours as needed for cough. 180 mL 0  . levofloxacin (LEVAQUIN) 500 MG tablet Take 1 tablet (500 mg total) by mouth daily. 7 tablet 0  . lisinopril (PRINIVIL,ZESTRIL) 40 MG tablet Take 1 tablet (40 mg total) by mouth daily. 90 tablet 1  . Multiple Vitamin (MULITIVITAMIN WITH MINERALS) TABS Take 1 tablet by mouth daily.    [name] NITROSTAT 0.4 MG SL tablet place 1 tablet under the tongue if needed every 5 minutes for chest pain for 3 doses IF NO RELIEF AFTER  3RD DOSE CALL PRESCRIBER OR 911. 25 tablet 2  . Omega-3 Fatty Acids (FISH OIL) 1000 MG CAPS Take 1 capsule (1,000 mg total) by mouth 2 (two) times daily.  0  . pantoprazole (PROTONIX) 40 MG tablet Take 1 tablet (40 mg total) by mouth daily. 90 tablet 1  . traZODone (DESYREL) 50 MG tablet Take 0.5-1 tablets (25-50 mg total) by mouth at bedtime as needed for sleep. 30 tablet 3  . warfarin (COUMADIN) 5 MG tablet Take 1 1/2 tablets daily 45 tablet 3   No current facility-administered medications for this visit.    Discontinued Meds:   There are no discontinued medications.  Patient Active Problem List   Diagnosis Date Noted  . Insomnia 07/16/2014  . Encounter for therapeutic drug monitoring 07/05/2013  . ED (erectile dysfunction) 06/17/2013  . Testicular mass 06/15/2013  . Chronic kidney disease 09/28/2011  . Seasonal allergies 07/01/2011  . GERD (gastroesophageal reflux disease) 06/01/2011  . Esophageal dysphagia 06/01/2011  . Family history of colon cancer 05/20/2011  . Chronic anticoagulation 09/24/2010  . Tobacco abuse, in remission   . Atrial fibrillation 11/13/2009  . ATHEROSCLEROTIC CARDIOVASCULAR DISEASE 11/13/2009  . PERIPHERAL VASCULAR DISEASE 11/13/2009  . Hyperlipidemia 10/24/2008  .  Testicular cancer 10/22/2008  . Hypertension 10/22/2008  . NEPHROLITHIASIS 10/22/2008    LABS    Component Value Date/Time   NA 137 07/16/2014 1137   NA 142 05/14/2013 0905   NA 136 12/11/2012 0840   K 4.7 07/16/2014 1137   K 4.9 05/14/2013 0905   K 4.5 12/11/2012 0840   CL 103 07/16/2014 1137   CL 104 05/14/2013 0905   CL 104 12/11/2012 0840   CO2 26 07/16/2014 1137   CO2 26 05/14/2013 0905   CO2 27 12/11/2012 0840   GLUCOSE 87 07/16/2014 1137   GLUCOSE 92 05/14/2013 0905   GLUCOSE 94 12/11/2012 0840   BUN 14 07/16/2014 1137   BUN 19 05/14/2013 0905   BUN 16 12/11/2012 0840   CREATININE 1.13 07/16/2014 1137   CREATININE 1.42* 05/14/2013 0905   CREATININE 1.23 12/11/2012  0840   CREATININE 1.45 06/05/2010 0530   CREATININE 1.39 06/04/2010 1355   CREATININE 1.47 06/04/2010 0540   CALCIUM 9.6 07/16/2014 1137   CALCIUM 9.4 05/14/2013 0905   CALCIUM 9.7 12/11/2012 0840   GFRNONAA 53* 05/14/2013 0905   GFRNONAA 50* 06/05/2010 0530   GFRNONAA 53* 06/04/2010 1355   GFRNONAA 49* 06/04/2010 0540   GFRAA 62 05/14/2013 0905   GFRAA  06/05/2010 0530    >60        The eGFR has been calculated using the MDRD equation. This calculation has not been validated in all clinical situations. eGFR's persistently <60 mL/min signify possible Chronic Kidney Disease.   GFRAA  06/04/2010 1355    >60        The eGFR has been calculated using the MDRD equation. This calculation has not been validated in all clinical situations. eGFR's persistently <60 mL/min signify possible Chronic Kidney Disease.   GFRAA * 06/04/2010 0540    60        The eGFR has been calculated using the MDRD equation. This calculation has not been validated in all clinical situations. eGFR's persistently <60 mL/min signify possible Chronic Kidney Disease.   CMP     Component Value Date/Time   NA 137 07/16/2014 1137   K 4.7 07/16/2014 1137   CL 103 07/16/2014 1137   CO2 26 07/16/2014 1137   GLUCOSE 87 07/16/2014 1137   BUN 14 07/16/2014 1137   CREATININE 1.13 07/16/2014 1137   CREATININE 1.45 06/05/2010 0530   CALCIUM 9.6 07/16/2014 1137   PROT 6.8 07/16/2014 1137   ALBUMIN 3.9 07/16/2014 1137   AST 13 07/16/2014 1137   ALT 13 07/16/2014 1137   ALKPHOS 84 07/16/2014 1137   BILITOT 0.6 07/16/2014 1137   GFRNONAA 53* 05/14/2013 0905   GFRNONAA 50* 06/05/2010 0530   GFRAA 62 05/14/2013 0905   GFRAA  06/05/2010 0530    >60        The eGFR has been calculated using the MDRD equation. This calculation has not been validated in all clinical situations. eGFR's persistently <60 mL/min signify possible Chronic Kidney Disease.       Component Value Date/Time   WBC 6.3  07/16/2014 1137   WBC 6.9 05/14/2013 0905   WBC 7.4 08/23/2011 1443   HGB 15.8 07/16/2014 1137   HGB 16.0 05/14/2013 0905   HGB 15.6 08/23/2011 1443   HCT 45.9 07/16/2014 1137   HCT 45.3 05/14/2013 0905   HCT 44.1 08/23/2011 1443   MCV 86.0 07/16/2014 1137   MCV 84.2 05/14/2013 0905   MCV 87.3 08/23/2011 1443    Lipid Panel  Component Value Date/Time   CHOL 186 07/16/2014 1137   TRIG 309* 07/16/2014 1137   HDL 28* 07/16/2014 1137   CHOLHDL 6.6 07/16/2014 1137   VLDL 62* 07/16/2014 1137   LDLCALC 96 07/16/2014 1137    ABG    Component Value Date/Time   TCO2 30 06/16/2009 1602     Lab Results  Component Value Date   TSH 0.492 05/20/2011   BNP (last 3 results) No results for input(s): BNP in the last 8760 hours.  ProBNP (last 3 results) No results for input(s): PROBNP in the last 8760 hours.  Cardiac Panel (last 3 results) No results for input(s): CKTOTAL, CKMB, TROPONINI, RELINDX in the last 72 hours.  Iron/TIBC/Ferritin/ %Sat No results found for: IRON, TIBC, FERRITIN, IRONPCTSAT   EKG Orders placed or performed in visit on 11/07/14  . EKG 12-Lead     Prior Assessment and Plan Problem List as of 11/07/2014    Testicular cancer   Last Assessment & Plan 05/20/2011 Office Visit Written 05/23/2011  5:20 PM by Alycia Rossetti, MD    Remote history of testicular cancer,no current therapy      Hyperlipidemia   Last Assessment & Plan 09/26/2013 Office Visit Written 09/27/2013  7:55 AM by Alycia Rossetti, MD    Chest Lipitor as needed goals of LDL less than 100 with history of peripheral vascular disease coronary artery disease      Hypertension   Last Assessment & Plan 07/16/2014 Office Visit Written 07/16/2014  1:43 PM by Alycia Rossetti, MD     Blood pressure still quite elevated I have not seen him in about a year and it was elevated at that time too. I'll increase his lisinopril to 40 mg to return in 6 weeks I will repeat his kidney function at that time       Atrial fibrillation   Last Assessment & Plan 09/26/2013 Office Visit Written 09/27/2013  7:55 AM by Alycia Rossetti, MD    No recent chest pain or shortness of breath he is on Coumadin therapy by cardiology      ATHEROSCLEROTIC CARDIOVASCULAR DISEASE   Last Assessment & Plan 05/14/2013 Office Visit Written 05/14/2013 11:00 AM by Alycia Rossetti, MD    Followed by cardiology has not had any chest pain and use of nitroglycerin      PERIPHERAL VASCULAR DISEASE   Last Assessment & Plan 08/20/2010 Office Visit Written 08/20/2010  5:06 PM by Yehuda Savannah, MD    ABIs showed no significant obstruction of flow to the lower extremities; Leg discomfort apparently did not represent claudication and has resolved without specific therapy.      NEPHROLITHIASIS   Tobacco abuse, in remission   Chronic anticoagulation   Last Assessment & Plan 11/12/2011 Office Visit Written 11/12/2011  2:28 PM by Yehuda Savannah, MD    Stable and therapeutic anticoagulation without adverse effects.  We will continue to monitor for possible occult GI blood loss.      Family history of colon cancer   Last Assessment & Plan 07/05/2011 Office Visit Written 07/05/2011  9:56 AM by Mahala Menghini, PA    Second attempt for colonoscopy. Patient misunderstood bowel prep last time so colonoscopy was incomplete secondary to solid stool beyond sigmoid colon. Add dulcolax 81m po daily for three days before procedure. Will hold coumadin four days before procedure like last time, previously approved by cardiologist. Patient to call if any questions about prep. Full detailed verbal explanation given  today along with handout.      GERD (gastroesophageal reflux disease)   Last Assessment & Plan 05/14/2013 Office Visit Written 05/14/2013 10:59 AM by Alycia Rossetti, MD    Continue proton X. medications refill      Esophageal dysphagia   Last Assessment & Plan 06/01/2011 Office Visit Written 06/01/2011 10:11 AM by Mahala Menghini, PA     Chronic intermittent GERD for years, solid food esophageal dysphagia. Recommend EGD with dilation in the near future. Rule out Barrett's esophagus. Plan hold Coumadin for 4 days prior to procedure. We'll touch base with the Coumadin clinic to verify that this is okay.  I have discussed the risks, alternatives, benefits with regards to but not limited to the risk of reaction to medication, bleeding, infection, perforation and the patient is agreeable to proceed. Written consent to be obtained.       Seasonal allergies   Last Assessment & Plan 09/28/2011 Office Visit Written 09/28/2011  4:10 PM by Alycia Rossetti, MD    He is to continue his Allegra.      Chronic kidney disease   Last Assessment & Plan 09/26/2013 Office Visit Written 09/27/2013  7:55 AM by Alycia Rossetti, MD    Recheck renal function      Testicular mass   Last Assessment & Plan 06/15/2013 Office Visit Written 06/17/2013  7:22 AM by Alycia Rossetti, MD    Ultrasound to be done, as mass like lesion present for many years on the same side as his testicular cancer and has never been evaluated He has not had any pain from the region      ED (erectile dysfunction)   Last Assessment & Plan 06/15/2013 Office Visit Written 06/17/2013  7:23 AM by Alycia Rossetti, MD    Check Testosterone level and PSA      Encounter for therapeutic drug monitoring   Insomnia   Last Assessment & Plan 07/16/2014 Office Visit Written 07/16/2014  1:43 PM by Alycia Rossetti, MD     Trial of trazodone for his sleep          Imaging: No results found.

## 2014-11-07 NOTE — Progress Notes (Signed)
Cardiology Office Note   Date:  11/07/2014   ID:  ADWIN CARRAHER, DOB [birth year], MRN [id number]  PCP:  Vic Blackbird, MD  Cardiologist:  To be established/ Jory Sims, NP   Chief Complaint  Patient presents with  . Coronary Artery Disease  . Atrial Fibrillation  . Hypertension      History of Present Illness: [name] [name] is a 64 y.o. male who presents for ongoing assessment and management of CAD, atrial fib, and hypertension. He has not been seen in the office since 2014 and was formerly followed by Dr. Lattie Haw. He is on anticoagulation with CHADS VASC Score of 2. He comes today for routine cardiology evaluation.   Since being seen last, he has been treated for pneumonia. He has not been admitted to the hospital and has not had any ER visits. He has not had any new diagnosis. He is medically compliant and is being followed by our coumadin clinic. He does not drink or smoke. He remains active, kayaking and being outdoors.     Past Medical History  Diagnosis Date  . Arteriosclerotic cardiovascular disease (ASCVD)     AMI in 2000 treated at [facility]; cath in 12/2006->  Chronic total obstruction of the RCA;  drug-eluting stent placed in M1; inferior hypokinesis with an EF of 45%  . Tobacco abuse, in remission     20 pack years; quit in 2009  . Hypertension   . Chronic anticoagulation   . Atrial fibrillation     on coumadin for couple of years, stopped plavix at that time  . PVD (peripheral vascular disease)     Ct angiogram in 2009 revealed stable disease with 80% celiac stenosis,50% right renal artery ,ASCVD with ulceration in the abdominal aortashe  . Nephrolithiasis   . Cerebrovascular disease 2002    carotid stent  . Hyperlipidemia   . Myocardial infarction 10 yrs ago  . Dysphagia   . Testicular carcinoma 1990    right orchiectomy    Past Surgical History  Procedure Laterality Date  . Appendectomy  2004  . Testicular cancer  1990    right orchiectomy  .  Colonoscopy  06/17/2011    INCOMPLETE, PREP POOR. Procedure: COLONOSCOPY;  Surgeon: Daneil Dolin, MD;  Location: [facility];  Service: Endoscopy;  Laterality: N/A;  10:00  . Esophagogastroduodenoscopy  06/17/2011    severe erosive/ulcerative reflux esophagitis, soft noncritical stricture dilatied, small hh, antral erosion   . Colonoscopy  07/15/2011    Procedure: COLONOSCOPY;  Surgeon: Daneil Dolin, MD;  Location: [facility];  Service: Endoscopy;  Laterality: N/A;  10:30     Current Outpatient Prescriptions  Medication Sig Dispense Refill  . amLODipine (NORVASC) 10 MG tablet take 1 tablet by mouth once daily 90 tablet 3  . atorvastatin (LIPITOR) 20 MG tablet Take 1 tablet (20 mg total) by mouth at bedtime. 30 tablet 3  . cloNIDine (CATAPRES) 0.1 MG tablet Take 1 tablet (0.1 mg total) by mouth 2 (two) times daily. 180 tablet 1  . diphenhydrAMINE (BENADRYL) 25 MG tablet Take 25 mg by mouth daily as needed for allergies.    . fexofenadine (ALLEGRA) 180 MG tablet Take 1 tablet (180 mg total) by mouth daily. (Patient not taking: Reported on 07/16/2014) 30 tablet 2  . guaiFENesin-codeine 100-10 MG/5ML syrup Take 5 mLs by mouth every 6 (six) hours as needed for cough. 180 mL 0  . levofloxacin (LEVAQUIN) 500 MG tablet Take 1 tablet (500 mg total) by  mouth daily. 7 tablet 0  . lisinopril (PRINIVIL,ZESTRIL) 40 MG tablet Take 1 tablet (40 mg total) by mouth daily. 90 tablet 1  . Multiple Vitamin (MULITIVITAMIN WITH MINERALS) TABS Take 1 tablet by mouth daily.    [name] NITROSTAT 0.4 MG SL tablet place 1 tablet under the tongue if needed every 5 minutes for chest pain for 3 doses IF NO RELIEF AFTER 3RD DOSE CALL PRESCRIBER OR 911. 25 tablet 2  . Omega-3 Fatty Acids (FISH OIL) 1000 MG CAPS Take 1 capsule (1,000 mg total) by mouth 2 (two) times daily.  0  . pantoprazole (PROTONIX) 40 MG tablet Take 1 tablet (40 mg total) by mouth daily. 90 tablet 1  . traZODone (DESYREL) 50 MG tablet Take 0.5-1 tablets  (25-50 mg total) by mouth at bedtime as needed for sleep. 30 tablet 3  . warfarin (COUMADIN) 5 MG tablet Take 1 1/2 tablets daily 45 tablet 3   No current facility-administered medications for this visit.    Allergies:   Review of patient's allergies indicates no known allergies.    Social History:  The patient  reports that he quit smoking about 6 years ago. His smoking use included Cigarettes. He has a 80 pack-year smoking history. He has never used smokeless tobacco. He reports that he does not drink alcohol or use illicit drugs.   Family History:  The patient's family history includes Colon cancer (age of onset: 22) in his father; Prostate cancer in his father. There is no history of Liver disease, Anesthesia problems, Hypotension, Malignant hyperthermia, or Pseudochol deficiency.    ROS: .   All other systems are reviewed and negative.Unless otherwise mentioned in H&P above.   PHYSICAL EXAM: VS:  There were no vitals taken for this visit. , BMI There is no weight on file to calculate BMI. GEN: Well nourished, well developed, in no acute distress HEENT: normal Neck: no JVD, carotid bruits, or masses Cardiac: IRRR; no murmurs, rubs, or gallops,no edema  Respiratory:  Clear to auscultation bilaterally, normal work of breathing GI: soft, nontender, nondistended, + BS MS: no deformity or atrophy Skin: warm and dry, no rash Neuro:  Strength and sensation are intact Psych: euthymic mood, full affect   EKG:  Atrial fib with rate of 87 bpm. Inferior abnormalities indicative of prior MI.   Recent Labs: 07/16/2014: ALT 13; BUN 14; Creat 1.13; Hemoglobin 15.8; Platelets 168; Potassium 4.7; Sodium 137    Lipid Panel    Component Value Date/Time   CHOL 186 07/16/2014 1137   TRIG 309* 07/16/2014 1137   HDL 28* 07/16/2014 1137   CHOLHDL 6.6 07/16/2014 1137   VLDL 62* 07/16/2014 1137   LDLCALC 96 07/16/2014 1137      Wt Readings from Last 3 Encounters:  08/02/14 198 lb (89.812  kg)  07/16/14 198 lb (89.812 kg)  09/26/13 195 lb (88.451 kg)      Other studies Reviewed: Additional studies/ records that were reviewed today include: None. Review of the above records demonstrates: N/A   ASSESSMENT AND PLAN:  1. Atrial fib: Heart rate is well controlled. Will continue his medications with plenty of refills. He will continue to see coumadin clinic for dosing. He is asymptomatic. I will see him again in one year. He will need to be established with cardiologist-Dr. Lambert Mody as he is DOD today.   2. CAD: He is asymptomatic. I will continue him on ASA, Statin and ACE. I will check lipids, LFT's, BMET and CBC. Copy to Dr.  Tyonek.   3. Hypertension: BP is well controlled. He will remain on amlodipine and other medications listed above.   Current medicines are reviewed at length with the patient today.    Labs/ tests ordered today include: CBC. BMET, Lipids and LFT's  No orders of the defined types were placed in this encounter.     Disposition:   FU with one year unless symptomatic. Signed, Jory Sims, NP  11/07/2014 7:27 AM    [facility] [address], [place], [place] [address] Phone: [phone]; Fax: [phone]

## 2014-11-07 NOTE — Patient Instructions (Signed)
Your physician wants you to follow-up in: 1 year.  You will receive a reminder letter in the mail two months in advance. If you don't receive a letter, please call our office to schedule the follow-up appointment.  Your physician recommends that you continue on your current medications as directed. Please refer to the Current Medication list given to you today.  Your physician recommends that you return for lab work in: (Fasting)  Thank you for choosing [facility]!

## 2014-11-08 LAB — BASIC METABOLIC PANEL
BUN: 19 mg/dL (ref 6–23)
CO2: 28 mEq/L (ref 19–32)
Calcium: 9.7 mg/dL (ref 8.4–10.5)
Chloride: 103 mEq/L (ref 96–112)
Creat: 1.1 mg/dL (ref 0.50–1.35)
Glucose, Bld: 103 mg/dL — ABNORMAL HIGH (ref 70–99)
Potassium: 4.5 mEq/L (ref 3.5–5.3)
Sodium: 139 mEq/L (ref 135–145)

## 2014-11-08 LAB — CBC WITH DIFFERENTIAL/PLATELET
Basophils Absolute: 0 10*3/uL (ref 0.0–0.1)
Basophils Relative: 0 % (ref 0–1)
Eosinophils Absolute: 0.1 10*3/uL (ref 0.0–0.7)
Eosinophils Relative: 1 % (ref 0–5)
HCT: 45.7 % (ref 39.0–52.0)
Hemoglobin: 15.6 g/dL (ref 13.0–17.0)
Lymphocytes Relative: 36 % (ref 12–46)
Lymphs Abs: 2 10*3/uL (ref 0.7–4.0)
MCH: 29.4 pg (ref 26.0–34.0)
MCHC: 34.1 g/dL (ref 30.0–36.0)
MCV: 86.1 fL (ref 78.0–100.0)
MPV: 9.6 fL (ref 8.6–12.4)
Monocytes Absolute: 0.5 10*3/uL (ref 0.1–1.0)
Monocytes Relative: 9 % (ref 3–12)
Neutro Abs: 3 10*3/uL (ref 1.7–7.7)
Neutrophils Relative %: 54 % (ref 43–77)
Platelets: 185 10*3/uL (ref 150–400)
RBC: 5.31 MIL/uL (ref 4.22–5.81)
RDW: 14.6 % (ref 11.5–15.5)
WBC: 5.6 10*3/uL (ref 4.0–10.5)

## 2014-11-08 LAB — HEPATIC FUNCTION PANEL
ALT: 10 U/L (ref 0–53)
AST: 11 U/L (ref 0–37)
Albumin: 3.8 g/dL (ref 3.5–5.2)
Alkaline Phosphatase: 96 U/L (ref 39–117)
Bilirubin, Direct: 0.1 mg/dL (ref 0.0–0.3)
Indirect Bilirubin: 0.4 mg/dL (ref 0.2–1.2)
Total Bilirubin: 0.5 mg/dL (ref 0.2–1.2)
Total Protein: 6.9 g/dL (ref 6.0–8.3)

## 2014-11-08 LAB — LIPID PANEL
Cholesterol: 141 mg/dL (ref 0–200)
HDL: 29 mg/dL — ABNORMAL LOW (ref >=40)
LDL Cholesterol: 59 mg/dL (ref 0–99)
Total CHOL/HDL Ratio: 4.9 Ratio
Triglycerides: 263 mg/dL — ABNORMAL HIGH (ref <150)
VLDL: 53 mg/dL — ABNORMAL HIGH (ref 0–40)

## 2014-12-16 ENCOUNTER — Other Ambulatory Visit: Payer: Self-pay | Admitting: *Deleted

## 2014-12-16 MED ORDER — NITROGLYCERIN 0.4 MG SL SUBL: SUBLINGUAL_TABLET | SUBLINGUAL | Status: DC

## 2014-12-23 ENCOUNTER — Telehealth: Payer: Self-pay | Admitting: Family Medicine

## 2014-12-23 MED ORDER — PANTOPRAZOLE SODIUM 40 MG PO TBEC: 40.0000 mg | DELAYED_RELEASE_TABLET | Freq: Every day | ORAL | Status: DC

## 2014-12-23 NOTE — Telephone Encounter (Signed)
Received return call from patient wife, Vaughan Basta.   States that patient is completely out of medication. States that she may have accidentally taken some of his since they are on the same medicine.   Advised that prescription has refills on it, but insurance will not pay to have prescription refilled early, and to fill this early would be out of picket expense.   Verbalized understanding.

## 2014-12-23 NOTE — Telephone Encounter (Signed)
Call placed to pharmacy.   Was advised that patient received #90 on 3/27, #90 on 6/3 and emergency fill of #3 on 8/20.   Advised that if prescription is beng taken as prescribed (QD) then patient should have enough until 9/3.  Call placed to patient to inquire. [place].

## 2014-12-23 NOTE — Telephone Encounter (Signed)
Patient already called pharmacy for refill on his pantoprazole they told him to call us [phone] [facility] [place]

## 2014-12-26 ENCOUNTER — Other Ambulatory Visit: Payer: Self-pay | Admitting: Family Medicine

## 2014-12-26 MED ORDER — PANTOPRAZOLE SODIUM 40 MG PO TBEC: 40.0000 mg | DELAYED_RELEASE_TABLET | Freq: Every day | ORAL | Status: DC

## 2014-12-26 NOTE — Telephone Encounter (Signed)
Medication refilled per protocol. 

## 2015-01-10 ENCOUNTER — Ambulatory Visit (INDEPENDENT_AMBULATORY_CARE_PROVIDER_SITE_OTHER): Payer: Medicare Other | Admitting: *Deleted

## 2015-01-10 DIAGNOSIS — Z5181 Encounter for therapeutic drug level monitoring: Secondary | ICD-10-CM | POA: Diagnosis not present

## 2015-01-10 DIAGNOSIS — I480 Paroxysmal atrial fibrillation: Secondary | ICD-10-CM

## 2015-01-10 DIAGNOSIS — I4891 Unspecified atrial fibrillation: Secondary | ICD-10-CM | POA: Diagnosis not present

## 2015-01-10 LAB — POCT INR: INR: 3.2

## 2015-02-10 ENCOUNTER — Ambulatory Visit (INDEPENDENT_AMBULATORY_CARE_PROVIDER_SITE_OTHER): Payer: Medicare Other | Admitting: *Deleted

## 2015-02-10 DIAGNOSIS — Z5181 Encounter for therapeutic drug level monitoring: Secondary | ICD-10-CM | POA: Diagnosis not present

## 2015-02-10 DIAGNOSIS — I480 Paroxysmal atrial fibrillation: Secondary | ICD-10-CM

## 2015-02-10 DIAGNOSIS — I4891 Unspecified atrial fibrillation: Secondary | ICD-10-CM | POA: Diagnosis not present

## 2015-02-10 LAB — POCT INR: INR: 3

## 2015-03-10 ENCOUNTER — Ambulatory Visit (INDEPENDENT_AMBULATORY_CARE_PROVIDER_SITE_OTHER): Payer: Medicare Other | Admitting: *Deleted

## 2015-03-10 DIAGNOSIS — I4891 Unspecified atrial fibrillation: Secondary | ICD-10-CM

## 2015-03-10 DIAGNOSIS — I480 Paroxysmal atrial fibrillation: Secondary | ICD-10-CM

## 2015-03-10 DIAGNOSIS — Z5181 Encounter for therapeutic drug level monitoring: Secondary | ICD-10-CM | POA: Diagnosis not present

## 2015-03-10 LAB — POCT INR: INR: 3.6

## 2015-03-31 ENCOUNTER — Encounter: Payer: Self-pay | Admitting: *Deleted

## 2015-04-09 ENCOUNTER — Encounter (HOSPITAL_COMMUNITY): Payer: Self-pay | Admitting: Emergency Medicine

## 2015-04-09 ENCOUNTER — Emergency Department (HOSPITAL_COMMUNITY): Payer: Medicare Other

## 2015-04-09 ENCOUNTER — Emergency Department (HOSPITAL_COMMUNITY)
Admission: EM | Admit: 2015-04-09 | Discharge: 2015-04-10 | Disposition: A | Discharge status: Home / Self Care | Payer: Medicare Other | Attending: Emergency Medicine | Admitting: Emergency Medicine

## 2015-04-09 DIAGNOSIS — E785 Hyperlipidemia, unspecified: Secondary | ICD-10-CM | POA: Diagnosis not present

## 2015-04-09 DIAGNOSIS — Z87442 Personal history of urinary calculi: Secondary | ICD-10-CM | POA: Insufficient documentation

## 2015-04-09 DIAGNOSIS — Z9079 Acquired absence of other genital organ(s): Secondary | ICD-10-CM | POA: Insufficient documentation

## 2015-04-09 DIAGNOSIS — J069 Acute upper respiratory infection, unspecified: Secondary | ICD-10-CM | POA: Insufficient documentation

## 2015-04-09 DIAGNOSIS — I4891 Unspecified atrial fibrillation: Secondary | ICD-10-CM | POA: Diagnosis not present

## 2015-04-09 DIAGNOSIS — I10 Essential (primary) hypertension: Secondary | ICD-10-CM | POA: Insufficient documentation

## 2015-04-09 DIAGNOSIS — Z8547 Personal history of malignant neoplasm of testis: Secondary | ICD-10-CM | POA: Insufficient documentation

## 2015-04-09 DIAGNOSIS — Z9861 Coronary angioplasty status: Secondary | ICD-10-CM | POA: Insufficient documentation

## 2015-04-09 DIAGNOSIS — I2510 Atherosclerotic heart disease of native coronary artery without angina pectoris: Secondary | ICD-10-CM | POA: Insufficient documentation

## 2015-04-09 DIAGNOSIS — Z8673 Personal history of transient ischemic attack (TIA), and cerebral infarction without residual deficits: Secondary | ICD-10-CM | POA: Diagnosis not present

## 2015-04-09 DIAGNOSIS — Z7901 Long term (current) use of anticoagulants: Secondary | ICD-10-CM | POA: Insufficient documentation

## 2015-04-09 DIAGNOSIS — I252 Old myocardial infarction: Secondary | ICD-10-CM | POA: Diagnosis not present

## 2015-04-09 DIAGNOSIS — R062 Wheezing: Secondary | ICD-10-CM

## 2015-04-09 DIAGNOSIS — Z79899 Other long term (current) drug therapy: Secondary | ICD-10-CM | POA: Diagnosis not present

## 2015-04-09 DIAGNOSIS — R05 Cough: Secondary | ICD-10-CM | POA: Diagnosis present

## 2015-04-09 DIAGNOSIS — F17210 Nicotine dependence, cigarettes, uncomplicated: Secondary | ICD-10-CM | POA: Diagnosis not present

## 2015-04-09 LAB — COMPREHENSIVE METABOLIC PANEL
ALT: 13 U/L — ABNORMAL LOW (ref 17–63)
AST: 16 U/L (ref 15–41)
Albumin: 3.7 g/dL (ref 3.5–5.0)
Alkaline Phosphatase: 91 U/L (ref 38–126)
Anion gap: 5 (ref 5–15)
BUN: 12 mg/dL (ref 6–20)
CO2: 24 mmol/L (ref 22–32)
Calcium: 9.3 mg/dL (ref 8.9–10.3)
Chloride: 106 mmol/L (ref 101–111)
Creatinine, Ser: 1.21 mg/dL (ref 0.61–1.24)
GFR calc Af Amer: >60 mL/min (ref >60)
GFR calc non Af Amer: >60 mL/min (ref >60)
Glucose, Bld: 107 mg/dL — ABNORMAL HIGH (ref 65–99)
Potassium: 3.6 mmol/L (ref 3.5–5.1)
Sodium: 135 mmol/L (ref 135–145)
Total Bilirubin: 0.4 mg/dL (ref 0.3–1.2)
Total Protein: 7.4 g/dL (ref 6.5–8.1)

## 2015-04-09 LAB — CBC WITH DIFFERENTIAL/PLATELET
Basophils Absolute: 0 10*3/uL (ref 0.0–0.1)
Basophils Relative: 1 %
Eosinophils Absolute: 0.2 10*3/uL (ref 0.0–0.7)
Eosinophils Relative: 4 %
HCT: 44.5 % (ref 39.0–52.0)
Hemoglobin: 15.6 g/dL (ref 13.0–17.0)
Lymphocytes Relative: 26 %
Lymphs Abs: 1.4 10*3/uL (ref 0.7–4.0)
MCH: 30.1 pg (ref 26.0–34.0)
MCHC: 35.1 g/dL (ref 30.0–36.0)
MCV: 85.7 fL (ref 78.0–100.0)
Monocytes Absolute: 0.7 10*3/uL (ref 0.1–1.0)
Monocytes Relative: 13 %
Neutro Abs: 2.9 10*3/uL (ref 1.7–7.7)
Neutrophils Relative %: 56 %
Platelets: 104 10*3/uL — ABNORMAL LOW (ref 150–400)
RBC: 5.19 MIL/uL (ref 4.22–5.81)
RDW: 13.5 % (ref 11.5–15.5)
Smear Review: DECREASED
WBC: 5.2 10*3/uL (ref 4.0–10.5)

## 2015-04-09 LAB — TROPONIN I: Troponin I: <0.03 ng/mL (ref <0.031)

## 2015-04-09 MED ORDER — METHYLPREDNISOLONE SODIUM SUCC 125 MG IJ SOLR
125.0000 mg | Freq: Once | INTRAMUSCULAR | Status: AC
  Administered 2015-04-10: 125 mg via INTRAVENOUS
  Filled 2015-04-09: qty 2

## 2015-04-09 MED ORDER — PREDNISONE 50 MG PO TABS: 60.0000 mg | ORAL_TABLET | Freq: Once | ORAL | Status: DC

## 2015-04-09 MED ORDER — ALBUTEROL (5 MG/ML) CONTINUOUS INHALATION SOLN
10.0000 mg/h | INHALATION_SOLUTION | Freq: Once | RESPIRATORY_TRACT | Status: AC
  Administered 2015-04-10: 10 mg/h via RESPIRATORY_TRACT
  Filled 2015-04-09 (×2): qty 20

## 2015-04-09 MED ORDER — ALBUTEROL SULFATE (2.5 MG/3ML) 0.083% IN NEBU
5.0000 mg | INHALATION_SOLUTION | Freq: Once | RESPIRATORY_TRACT | Status: AC
  Administered 2015-04-09: 5 mg via RESPIRATORY_TRACT
  Filled 2015-04-09: qty 6

## 2015-04-09 NOTE — ED Notes (Signed)
Cough with SOB that started yesterday

## 2015-04-09 NOTE — ED Provider Notes (Signed)
By signing my name below, I, Soijett Blue, attest that this documentation has been prepared under the direction and in the presence of [employer], DO. Electronically Signed: Soijett Blue, ED Scribe. 04/09/2015. 12:00 AM.  TIME SEEN: 11:56 PM  CHIEF COMPLAINT: Cough  HPI: [name] [name] is a 64 y.o. male with a PMHx of  CAD, atrial fibrillation on Coumadin, hypertension, hyperlipidemia , tobacco use who presents to the Emergency Department complaining of dry cough onset 4 days. He states that he is having associated symptoms of SOB, nasal congestion, subjective fever, diarrhea x 2 episodes. He states that he has not tried any medications for the relief for his symptoms. He denies vomiting, and any other symptoms. He notes that he still smokes cigarettes. He notes that he takes warfarin.  Denies any chest pain. No history of asthma or COPD. No history of PE or DVT.  PCP: Dr. Vic Blackbird    ROS: See HPI Constitutional:  subjective fever  Eyes: no drainage  ENT: no runny nose   Cardiovascular:  no chest pain  Resp:  SOB  GI: no vomiting GU: no dysuria Integumentary: no rash  Allergy: no hives  Musculoskeletal: no leg swelling  Neurological: no slurred speech ROS otherwise negative  PAST MEDICAL HISTORY/PAST SURGICAL HISTORY:  Past Medical History  Diagnosis Date  . Arteriosclerotic cardiovascular disease (ASCVD)     AMI in 2000 treated at [facility]; cath in 12/2006->  Chronic total obstruction of the RCA;  drug-eluting stent placed in M1; inferior hypokinesis with an EF of 45%  . Tobacco abuse, in remission     20 pack years; quit in 2009  . Hypertension   . Chronic anticoagulation   . Atrial fibrillation ([place])     on coumadin for couple of years, stopped plavix at that time  . PVD (peripheral vascular disease) ([place])     Ct angiogram in 2009 revealed stable disease with 80% celiac stenosis,50% right renal artery ,ASCVD with ulceration in the abdominal aortashe  .  Nephrolithiasis   . Cerebrovascular disease 2002    carotid stent  . Hyperlipidemia   . Myocardial infarction ([place]) 10 yrs ago  . Dysphagia   . Testicular carcinoma ([place]) 1990    right orchiectomy    MEDICATIONS:  Prior to Admission medications   Medication Sig Start Date End Date Taking? Authorizing Provider  amLODipine (NORVASC) 10 MG tablet take 1 tablet by mouth once daily 07/16/14   Alycia Rossetti, MD  atorvastatin (LIPITOR) 20 MG tablet Take 1 tablet (20 mg total) by mouth at bedtime. 07/22/14   Alycia Rossetti, MD  cloNIDine (CATAPRES) 0.1 MG tablet Take 1 tablet (0.1 mg total) by mouth 2 (two) times daily. 11/07/14 11/07/15  Lendon Colonel, NP  diphenhydrAMINE (BENADRYL) 25 MG tablet Take 25 mg by mouth daily as needed for allergies.    Historical Provider, MD  fexofenadine (ALLEGRA) 180 MG tablet Take 1 tablet (180 mg total) by mouth daily. 07/01/11   Alycia Rossetti, MD  lisinopril (PRINIVIL,ZESTRIL) 40 MG tablet Take 1 tablet (40 mg total) by mouth daily. 11/07/14   Lendon Colonel, NP  Multiple Vitamin (MULITIVITAMIN WITH MINERALS) TABS Take 1 tablet by mouth daily.    Historical Provider, MD  nitroGLYCERIN (NITROSTAT) 0.4 MG SL tablet place 1 tablet under the tongue if needed every 5 minutes for chest pain for 3 doses IF NO RELIEF AFTER 3RD DOSE CALL PRESCRIBER OR 911. 12/16/14   Lendon Colonel, NP  Omega-3 Fatty Acids (FISH OIL) 1000 MG CAPS Take 1 capsule (1,000 mg total) by mouth 2 (two) times daily. 11/12/11   Yehuda Savannah, MD  pantoprazole (PROTONIX) 40 MG tablet Take 1 tablet (40 mg total) by mouth daily. 12/26/14   Alycia Rossetti, MD  traZODone (DESYREL) 50 MG tablet Take 0.5-1 tablets (25-50 mg total) by mouth at bedtime as needed for sleep. 07/16/14   Alycia Rossetti, MD  warfarin (COUMADIN) 5 MG tablet Take 1 1/2 tablets daily 11/07/14   Lendon Colonel, NP    ALLERGIES:  No Known Allergies  SOCIAL HISTORY:  Social History  Substance Use Topics  .  Smoking status: Current Every Day Smoker -- 0.50 packs/day for 40 years    Types: Cigarettes    Last Attempt to Quit: 06/16/2008  . Smokeless tobacco: Never Used  . Alcohol Use: No    FAMILY HISTORY: Family History  Problem Relation Age of Onset  . Colon cancer Father 81    deceased  . Prostate cancer Father   . Liver disease Neg Hx   . Anesthesia problems Neg Hx   . Hypotension Neg Hx   . Malignant hyperthermia Neg Hx   . Pseudochol deficiency Neg Hx     EXAM: BP 146/81 mmHg  Pulse 72  Temp(Src) 98 F (36.7 C) (Oral)  Resp 21  Ht 6' (1.829 m)  Wt 189 lb (85.73 kg)  BMI 25.63 kg/m2  SpO2 100% CONSTITUTIONAL: Alert and oriented and responds appropriately to questions.  Elderly, smells of cigarette smoke, chronically ill-appearing HEAD: Normocephalic EYES: Conjunctivae clear, PERRL ENT: normal nose; no rhinorrhea; moist mucous membranes; pharynx without lesions noted NECK: Supple, no meningismus, no LAD  CARD:  Irregularly irregular; S1 and S2 appreciated; no murmurs, no clicks, no rubs, no gallops RESP: Normal chest excursion without splinting or tachypnea; breath sounds  Equal bilaterally but he does have diffuse expiratory wheezing, no rhonchi or rales, no hypoxia respirator distress, speaking full sentences ABD/GI: Normal bowel sounds; non-distended; soft, non-tender, no rebound, no guarding, no peritoneal signs BACK:  The back appears normal and is non-tender to palpation, there is no CVA tenderness EXT: Normal ROM in all joints; non-tender to palpation; no edema; normal capillary refill; no cyanosis, no calf tenderness or swelling    SKIN: Normal color for age and race; warm NEURO: Moves all extremities equally, sensation to light touch intact diffusely, cranial nerves II through XII intact PSYCH: The patient's mood and manner are appropriate. Grooming and personal hygiene are appropriate.  MEDICAL DECISION MAKING:  Patient here with likely viral upper respiratory  infection causing bronchospasm. He still smokes cigarettes but has no known history of COPD or asthma. We'll give steroids, continuous albuterol and reassess. Labs show no acute abnormality. Troponin negative. He does not appear volume overloaded. INR is  Almost therapeutic at 1.94. Chest x-ray shows no infiltrate or edema.  ED PROGRESS:  Patient's lungs are now clear to auscultation with improved aeration after albuterol. He reports feeling much better. We'll discharge on steroid burst and provide him with albuterol inhaler from the emergency department. Discussed strict return precautions. Recommend he quit smoking cigarettes. I do not feel he needs antibiotics at this time. He verbalizes understanding and is comfortable with this plan.    EKG Interpretation  Date/Time:  Wednesday April 09 2015 22:36:04 EST Ventricular Rate:  78 PR Interval:    QRS Duration: 112 QT Interval:  406 QTC Calculation: 462 R Axis:   -5 Text  Interpretation:  Atrial fibrillation Inferior infarct, old Borderline ST elevation, anterior leads No significant change since last tracing in 2012 Confirmed by Sophiana Milanese,  DO, Kingson Lohmeyer [phone]) on 04/09/2015 11:14:20 PM         I personally performed the services described in this documentation, which was scribed in my presence. The recorded information has been reviewed and is accurate.    [place], DO 04/10/15 [phone]

## 2015-04-10 LAB — PROTIME-INR
INR: 1.94 — ABNORMAL HIGH (ref 0.00–1.49)
Prothrombin Time: 22.1 seconds — ABNORMAL HIGH (ref 11.6–15.2)

## 2015-04-10 MED ORDER — ALBUTEROL SULFATE HFA 108 (90 BASE) MCG/ACT IN AERS
2.0000 | INHALATION_SPRAY | Freq: Once | RESPIRATORY_TRACT | Status: AC
  Administered 2015-04-10: 2 via RESPIRATORY_TRACT
  Filled 2015-04-10: qty 6.7

## 2015-04-10 MED ORDER — ALBUTEROL SULFATE HFA 108 (90 BASE) MCG/ACT IN AERS: 2.0000 | INHALATION_SPRAY | RESPIRATORY_TRACT | Status: DC | PRN

## 2015-04-10 MED ORDER — PREDNISONE 20 MG PO TABS: 60.0000 mg | ORAL_TABLET | Freq: Every day | ORAL | Status: DC

## 2015-04-10 MED ORDER — BENZONATATE 100 MG PO CAPS: 100.0000 mg | ORAL_CAPSULE | Freq: Three times a day (TID) | ORAL | Status: DC | PRN

## 2015-04-10 NOTE — Discharge Instructions (Signed)
How to Use an Inhaler Proper inhaler technique is very important. Good technique ensures that the medicine reaches the lungs. Poor technique results in depositing the medicine on the tongue and back of the throat rather than in the airways. If you do not use the inhaler with good technique, the medicine will not help you. STEPS TO FOLLOW IF USING AN INHALER WITHOUT AN EXTENSION TUBE  Remove the cap from the inhaler.  If you are using the inhaler for the first time, you will need to prime it. Shake the inhaler for 5 seconds and release four puffs into the air, away from your face. Ask your health care provider or pharmacist if you have questions about priming your inhaler.  Shake the inhaler for 5 seconds before each breath in (inhalation).  Position the inhaler so that the top of the canister faces up.  Put your index finger on the top of the medicine canister. Your thumb supports the bottom of the inhaler.  Open your mouth.  Either place the inhaler between your teeth and place your lips tightly around the mouthpiece, or hold the inhaler 1-2 inches away from your open mouth. If you are unsure of which technique to use, ask your health care provider.  Breathe out (exhale) normally and as completely as possible.  Press the canister down with your index finger to release the medicine.  At the same time as the canister is pressed, inhale deeply and slowly until your lungs are completely filled. This should take 4-6 seconds. Keep your tongue down.  Hold the medicine in your lungs for 5-10 seconds (10 seconds is best). This helps the medicine get into the small airways of your lungs.  Breathe out slowly, through pursed lips. Whistling is an example of pursed lips.  Wait at least 15-30 seconds between puffs. Continue with the above steps until you have taken the number of puffs your health care provider has ordered. Do not use the inhaler more than your health care provider tells  you.  Replace the cap on the inhaler.  Follow the directions from your health care provider or the inhaler insert for cleaning the inhaler. STEPS TO FOLLOW IF USING AN INHALER WITH AN EXTENSION (SPACER)  Remove the cap from the inhaler.  If you are using the inhaler for the first time, you will need to prime it. Shake the inhaler for 5 seconds and release four puffs into the air, away from your face. Ask your health care provider or pharmacist if you have questions about priming your inhaler.  Shake the inhaler for 5 seconds before each breath in (inhalation).  Place the open end of the spacer onto the mouthpiece of the inhaler.  Position the inhaler so that the top of the canister faces up and the spacer mouthpiece faces you.  Put your index finger on the top of the medicine canister. Your thumb supports the bottom of the inhaler and the spacer.  Breathe out (exhale) normally and as completely as possible.  Immediately after exhaling, place the spacer between your teeth and into your mouth. Close your lips tightly around the spacer.  Press the canister down with your index finger to release the medicine.  At the same time as the canister is pressed, inhale deeply and slowly until your lungs are completely filled. This should take 4-6 seconds. Keep your tongue down and out of the way.  Hold the medicine in your lungs for 5-10 seconds (10 seconds is best). This helps the  medicine get into the small airways of your lungs. Exhale.  Repeat inhaling deeply through the spacer mouthpiece. Again hold that breath for up to 10 seconds (10 seconds is best). Exhale slowly. If it is difficult to take this second deep breath through the spacer, breathe normally several times through the spacer. Remove the spacer from your mouth.  Wait at least 15-30 seconds between puffs. Continue with the above steps until you have taken the number of puffs your health care provider has ordered. Do not use the  inhaler more than your health care provider tells you.  Remove the spacer from the inhaler, and place the cap on the inhaler.  Follow the directions from your health care provider or the inhaler insert for cleaning the inhaler and spacer. If you are using different kinds of inhalers, use your quick relief medicine to open the airways 10-15 minutes before using a steroid if instructed to do so by your health care provider. If you are unsure which inhalers to use and the order of using them, ask your health care provider, nurse, or respiratory therapist. If you are using a steroid inhaler, always rinse your mouth with water after your last puff, then gargle and spit out the water. Do not swallow the water. AVOID:  Inhaling before or after starting the spray of medicine. It takes practice to coordinate your breathing with triggering the spray.  Inhaling through the nose (rather than the mouth) when triggering the spray. HOW TO DETERMINE IF YOUR INHALER IS FULL OR NEARLY EMPTY You cannot know when an inhaler is empty by shaking it. A few inhalers are now being made with dose counters. Ask your health care provider for a prescription that has a dose counter if you feel you need that extra help. If your inhaler does not have a counter, ask your health care provider to help you determine the date you need to refill your inhaler. Write the refill date on a calendar or your inhaler canister. Refill your inhaler 7-10 days before it runs out. Be sure to keep an adequate supply of medicine. This includes making sure it is not expired, and that you have a spare inhaler.  SEEK MEDICAL CARE IF:   Your symptoms are only partially relieved with your inhaler.  You are having trouble using your inhaler.  You have some increase in phlegm. SEEK IMMEDIATE MEDICAL CARE IF:   You feel little or no relief with your inhalers. You are still wheezing and are feeling shortness of breath or tightness in your chest or  both.  You have dizziness, headaches, or a fast heart rate.  You have chills, fever, or night sweats.  You have a noticeable increase in phlegm production, or there is blood in the phlegm. MAKE SURE YOU:   Understand these instructions.  Will watch your condition.  Will get help right away if you are not doing well or get worse.   This information is not intended to replace advice given to you by your health care provider. Make sure you discuss any questions you have with your health care provider.   Document Released: 04/16/2000 Document Revised: 02/07/2013 Document Reviewed: 11/16/2012 Elsevier Interactive Patient Education 2016 Elsevier Inc.  Upper Respiratory Infection, Adult Most upper respiratory infections (URIs) are a viral infection of the air passages leading to the lungs. A URI affects the nose, throat, and upper air passages. The most common type of URI is nasopharyngitis and is typically referred to as "the common cold."  URIs run their course and usually go away on their own. Most of the time, a URI does not require medical attention, but sometimes a bacterial infection in the upper airways can follow a viral infection. This is called a secondary infection. Sinus and middle ear infections are common types of secondary upper respiratory infections. Bacterial pneumonia can also complicate a URI. A URI can worsen asthma and chronic obstructive pulmonary disease (COPD). Sometimes, these complications can require emergency medical care and may be life threatening.  CAUSES Almost all URIs are caused by viruses. A virus is a type of germ and can spread from one person to another.  RISKS FACTORS You may be at risk for a URI if:   You smoke.   You have chronic heart or lung disease.  You have a weakened defense (immune) system.   You are very young or very old.   You have nasal allergies or asthma.  You work in crowded or poorly ventilated areas.  You work in health  care facilities or schools. SIGNS AND SYMPTOMS  Symptoms typically develop 2-3 days after you come in contact with a cold virus. Most viral URIs last 7-10 days. However, viral URIs from the influenza virus (flu virus) can last 14-18 days and are typically more severe. Symptoms may include:   Runny or stuffy (congested) nose.   Sneezing.   Cough.   Sore throat.   Headache.   Fatigue.   Fever.   Loss of appetite.   Pain in your forehead, behind your eyes, and over your cheekbones (sinus pain).  Muscle aches.  DIAGNOSIS  Your health care provider may diagnose a URI by:  Physical exam.  Tests to check that your symptoms are not due to another condition such as:  Strep throat.  Sinusitis.  Pneumonia.  Asthma. TREATMENT  A URI goes away on its own with time. It cannot be cured with medicines, but medicines may be prescribed or recommended to relieve symptoms. Medicines may help:  Reduce your fever.  Reduce your cough.  Relieve nasal congestion. HOME CARE INSTRUCTIONS   Take medicines only as directed by your health care provider.   Gargle warm saltwater or take cough drops to comfort your throat as directed by your health care provider.  Use a warm mist humidifier or inhale steam from a shower to increase air moisture. This may make it easier to breathe.  Drink enough fluid to keep your urine clear or pale yellow.   Eat soups and other clear broths and maintain good nutrition.   Rest as needed.   Return to work when your temperature has returned to normal or as your health care provider advises. You may need to stay home longer to avoid infecting others. You can also use a face mask and careful hand washing to prevent spread of the virus.  Increase the usage of your inhaler if you have asthma.   Do not use any tobacco products, including cigarettes, chewing tobacco, or electronic cigarettes. If you need help quitting, ask your health care  provider. PREVENTION  The best way to protect yourself from getting a cold is to practice good hygiene.   Avoid oral or hand contact with people with cold symptoms.   Wash your hands often if contact occurs.  There is no clear evidence that vitamin C, vitamin E, echinacea, or exercise reduces the chance of developing a cold. However, it is always recommended to get plenty of rest, exercise, and practice good nutrition.  SEEK MEDICAL CARE IF:   You are getting worse rather than better.   Your symptoms are not controlled by medicine.   You have chills.  You have worsening shortness of breath.  You have brown or red mucus.  You have yellow or brown nasal discharge.  You have pain in your face, especially when you bend forward.  You have a fever.  You have swollen neck glands.  You have pain while swallowing.  You have white areas in the back of your throat. SEEK IMMEDIATE MEDICAL CARE IF:   You have severe or persistent:  Headache.  Ear pain.  Sinus pain.  Chest pain.  You have chronic lung disease and any of the following:  Wheezing.  Prolonged cough.  Coughing up blood.  A change in your usual mucus.  You have a stiff neck.  You have changes in your:  Vision.  Hearing.  Thinking.  Mood. MAKE SURE YOU:   Understand these instructions.  Will watch your condition.  Will get help right away if you are not doing well or get worse.   This information is not intended to replace advice given to you by your health care provider. Make sure you discuss any questions you have with your health care provider.   Document Released: 10/13/2000 Document Revised: 09/03/2014 Document Reviewed: 07/25/2013 Elsevier Interactive Patient Education [employer].

## 2015-04-10 NOTE — ED Notes (Signed)
Pt finished breathing tx, mask removed

## 2015-04-22 ENCOUNTER — Ambulatory Visit (INDEPENDENT_AMBULATORY_CARE_PROVIDER_SITE_OTHER): Payer: Medicare Other | Admitting: Family Medicine

## 2015-04-22 ENCOUNTER — Encounter: Payer: Self-pay | Admitting: Family Medicine

## 2015-04-22 VITALS — BP 140/70 | HR 80 | Temp 98.4°F | Resp 16 | Ht 72.0 in | Wt 201.0 lb

## 2015-04-22 DIAGNOSIS — M545 Low back pain, unspecified: Secondary | ICD-10-CM

## 2015-04-22 DIAGNOSIS — I10 Essential (primary) hypertension: Secondary | ICD-10-CM

## 2015-04-22 DIAGNOSIS — K5900 Constipation, unspecified: Secondary | ICD-10-CM | POA: Insufficient documentation

## 2015-04-22 DIAGNOSIS — K5901 Slow transit constipation: Secondary | ICD-10-CM | POA: Diagnosis not present

## 2015-04-22 DIAGNOSIS — K635 Polyp of colon: Secondary | ICD-10-CM | POA: Diagnosis not present

## 2015-04-22 MED ORDER — HYDROCODONE-ACETAMINOPHEN 5-325 MG PO TABS: 1.0000 | ORAL_TABLET | Freq: Four times a day (QID) | ORAL | Status: DC | PRN

## 2015-04-22 MED ORDER — LINACLOTIDE 145 MCG PO CAPS: 145.0000 ug | ORAL_CAPSULE | Freq: Every day | ORAL | Status: DC

## 2015-04-22 NOTE — Progress Notes (Signed)
Patient ID: ULISES BELLS, male   DOB: [birth year], 64 y.o.   MRN: [id number]   Subjective:    Patient ID: REMMY BEERMAN, male    DOB: [birth year], 64 y.o.   MRN: [id number]  Patient presents for ER F/U  here for follow-up. He was seen in the emergency room a couple weeks ago had URI with wheezing. He was given albuterol and prednisone it is now resolved. He does state that he has been having severe constipation it worsened after he had his illness. He actually had some black stools and some blood streaked stools. He has history of multiple polyps and is overdue for his colonoscopy. He has been taking over-the-counter laxatives. She also complains of low back pain which is been chronic for him but it is flared up since he's been coughing so much. He is on Coumadin therapy for his A. fib therefore is not been taking any over-the-counter medications.    Review Of Systems:  GEN- denies fatigue, fever, weight loss,weakness, recent illness HEENT- denies eye drainage, change in vision, nasal discharge, CVS- denies chest pain, palpitations RESP- denies SOB, cough, wheeze ABD- denies N/V, +change in stools, abd pain GU- denies dysuria, hematuria, dribbling, incontinence MSK- + joint pain, muscle aches, injury Neuro- denies headache, dizziness, syncope, seizure activity       Objective:    BP 140/70 mmHg  Pulse 80  Temp(Src) 98.4 F (36.9 C) (Oral)  Resp 16  Ht 6' (1.829 m)  Wt 201 lb (91.173 kg)  BMI 27.25 kg/m2 GEN- NAD, alert and oriented x3,febrile, ill appearing HEENT- PERRL, EOMI, non injected sclera, pink conjunctiva, MMM, oropharynx clear Neck- Supple, no LAD CVS- irregular rhtyhem, normal rate, no murmur RESP CTAB ABD-NABS,soft,NT,ND EXT- No edema MSK- TTP Lumbar spine, neg SLR, good ROM spine,  Normal tone LE, strength equal bilat LE Pulses- Radial  2+      Assessment & Plan:      Problem List Items Addressed This Visit    Lumbago    Recent URI infection resolved.  I may give him hydrocodone 30 tablets when necessary for back pain. No red flags on exam      Relevant Medications   HYDROcodone-acetaminophen (NORCO) 5-325 MG tablet   Hypertension - Primary    Blood pressure high normal today are not continued this medication will have to monitor this.      Constipation    We'll treat his constipation with Linzess, as he has had bleeding polyps in the past also set him up for repeat colonoscopy is been 3 years.         Note: This dictation was prepared with Dragon dictation along with smaller phrase technology. Any transcriptional errors that result from this process are unintentional.

## 2015-04-22 NOTE — Assessment & Plan Note (Signed)
Blood pressure high normal today are not continued this medication will have to monitor this.

## 2015-04-22 NOTE — Assessment & Plan Note (Signed)
We'll treat his constipation with Linzess, as he has had bleeding polyps in the past also set him up for repeat colonoscopy is been 3 years.

## 2015-04-22 NOTE — Patient Instructions (Addendum)
Take the constipation as prescribed Referral to GI specialist for colonoscopy Try the Linzess for constipation F/U 4 months- Physical

## 2015-04-22 NOTE — Assessment & Plan Note (Signed)
Recent URI infection resolved. I may give him hydrocodone 30 tablets when necessary for back pain. No red flags on exam

## 2015-04-23 ENCOUNTER — Encounter: Payer: Self-pay | Admitting: Internal Medicine

## 2015-04-25 ENCOUNTER — Telehealth: Payer: Self-pay | Admitting: Family Medicine

## 2015-04-25 NOTE — Telephone Encounter (Signed)
Continue Linzess, add Miralax 1 cap full BID

## 2015-04-25 NOTE — Telephone Encounter (Signed)
Unable to reach wife at number she gave me.  Left message at home number with provider recommendations

## 2015-04-25 NOTE — Telephone Encounter (Signed)
Has started the Linzess.  Still has not had BM, is getting concerned.  Please advise?

## 2015-05-14 ENCOUNTER — Other Ambulatory Visit: Payer: Self-pay

## 2015-05-14 ENCOUNTER — Ambulatory Visit (INDEPENDENT_AMBULATORY_CARE_PROVIDER_SITE_OTHER): Payer: Medicare Other | Admitting: Gastroenterology

## 2015-05-14 ENCOUNTER — Encounter: Payer: Self-pay | Admitting: Gastroenterology

## 2015-05-14 VITALS — BP 164/82 | HR 65 | Temp 98.4°F | Ht 72.0 in | Wt 196.6 lb

## 2015-05-14 DIAGNOSIS — Z86010 Personal history of colonic polyps: Secondary | ICD-10-CM

## 2015-05-14 DIAGNOSIS — K219 Gastro-esophageal reflux disease without esophagitis: Secondary | ICD-10-CM

## 2015-05-14 DIAGNOSIS — R1310 Dysphagia, unspecified: Secondary | ICD-10-CM

## 2015-05-14 DIAGNOSIS — R1314 Dysphagia, pharyngoesophageal phase: Secondary | ICD-10-CM | POA: Diagnosis not present

## 2015-05-14 DIAGNOSIS — R1319 Other dysphagia: Secondary | ICD-10-CM

## 2015-05-14 DIAGNOSIS — Z7901 Long term (current) use of anticoagulants: Secondary | ICD-10-CM

## 2015-05-14 MED ORDER — PEG 3350-KCL-NA BICARB-NACL 420 G PO SOLR: 4000.0000 mL | Freq: Once | ORAL | Status: DC

## 2015-05-14 NOTE — Patient Instructions (Signed)
Colonoscopy and upper endoscopy with Dr. Gala Romney. See separate instructions.

## 2015-05-14 NOTE — Progress Notes (Signed)
Primary Care Physician:  Vic Blackbird, MD  Primary Gastroenterologist:  Garfield Cornea, MD   Chief Complaint  Patient presents with  . Colonoscopy    HPI:  [name] [name] is a 65 y.o. male here to schedule surveillance colonoscopy. Last colonoscopy was in March 2013. Prior attempt in February 2013 unsuccessful due to an adequate bowel prep. Patient had misunderstood directions and 8 solid food after bowel prep. In March 2013 he had suboptimal preparation. Multiple polyps removed from the rectum and hepatic and splenic flexures. Multiple tubular adenomas. Advised come back in 3 years for surveillance. Patient had last EGD in February 2013 with severe/ulcerative reflux esophagitis and was soft peptic stricture requiring dilation.  Complains of recurrent dysphagia to solid foods, not quite as bad as before but has been progressive. Some heartburn more recently. Takes pantoprazole daily. Recently developed constipation in the setting of upper respiratory infection. PCP provided with samples of Linzess. Initial stool was "black as tar". BM daily now. No brbpr. No n/v. No unintentional weight loss.  Current Outpatient Prescriptions  Medication Sig Dispense Refill  . albuterol (PROVENTIL HFA;VENTOLIN HFA) 108 (90 BASE) MCG/ACT inhaler Inhale 2-4 puffs into the lungs every 4 (four) hours as needed for wheezing or shortness of breath. 1 Inhaler 0  . amLODipine (NORVASC) 10 MG tablet take 1 tablet by mouth once daily 90 tablet 3  . atorvastatin (LIPITOR) 20 MG tablet Take 1 tablet (20 mg total) by mouth at bedtime. 30 tablet 3  . cloNIDine (CATAPRES) 0.1 MG tablet Take 1 tablet (0.1 mg total) by mouth 2 (two) times daily. 180 tablet 3  . diphenhydrAMINE (BENADRYL) 25 MG tablet Take 25 mg by mouth daily as needed for allergies.    . fexofenadine (ALLEGRA) 180 MG tablet Take 1 tablet (180 mg total) by mouth daily. 30 tablet 2  . lisinopril (PRINIVIL,ZESTRIL) 40 MG tablet Take 1 tablet (40 mg total) by  mouth daily. 90 tablet 3  . Multiple Vitamin (MULITIVITAMIN WITH MINERALS) TABS Take 1 tablet by mouth daily.    . nitroGLYCERIN (NITROSTAT) 0.4 MG SL tablet place 1 tablet under the tongue if needed every 5 minutes for chest pain for 3 doses IF NO RELIEF AFTER 3RD DOSE CALL PRESCRIBER OR 911. 25 tablet 2  . Omega-3 Fatty Acids (FISH OIL) 1000 MG CAPS Take 1 capsule (1,000 mg total) by mouth 2 (two) times daily.  0  . pantoprazole (PROTONIX) 40 MG tablet Take 1 tablet (40 mg total) by mouth daily. 90 tablet 1  . traZODone (DESYREL) 50 MG tablet Take 0.5-1 tablets (25-50 mg total) by mouth at bedtime as needed for sleep. 30 tablet 3  . warfarin (COUMADIN) 5 MG tablet Take 1 1/2 tablets daily 45 tablet 6   No current facility-administered medications for this visit.    Allergies as of 05/14/2015  . (No Known Allergies)    Past Medical History  Diagnosis Date  . Arteriosclerotic cardiovascular disease (ASCVD)     AMI in 2000 treated at [facility]; cath in 12/2006->  Chronic total obstruction of the RCA;  drug-eluting stent placed in M1; inferior hypokinesis with an EF of 45%  . Tobacco abuse, in remission     20 pack years; quit in 2009  . Hypertension   . Chronic anticoagulation   . Atrial fibrillation ([place])     on coumadin for couple of years, stopped plavix at that time  . PVD (peripheral vascular disease) ([place])     Ct angiogram in 2009  revealed stable disease with 80% celiac stenosis,50% right renal artery ,ASCVD with ulceration in the abdominal aortashe  . Nephrolithiasis   . Cerebrovascular disease 2002    carotid stent  . Hyperlipidemia   . Myocardial infarction ([place]) 10 yrs ago  . Dysphagia   . Testicular carcinoma [facility]) 1990    right orchiectomy    Past Surgical History  Procedure Laterality Date  . Appendectomy  2004  . Testicular cancer  1990    right orchiectomy  . Colonoscopy  06/17/2011    INCOMPLETE, PREP POOR. Procedure: COLONOSCOPY;  Surgeon: Daneil Dolin, MD;   Location: [facility];  Service: Endoscopy;  Laterality: N/A;  10:00  . Esophagogastroduodenoscopy  06/17/2011    severe erosive/ulcerative reflux esophagitis, soft noncritical stricture dilatied, small hh, antral erosion   . Colonoscopy  07/15/2011    [id number] rectal and colon polyps    Family History  Problem Relation Age of Onset  . Colon cancer Father 20    deceased  . Prostate cancer Father   . Liver disease Neg Hx   . Anesthesia problems Neg Hx   . Hypotension Neg Hx   . Malignant hyperthermia Neg Hx   . Pseudochol deficiency Neg Hx     Social History   Social History  . Marital Status: Married    Spouse Name: N/A  . Number of Children: 2  . Years of Education: N/A   Occupational History  . disabled    Social History Main Topics  . Smoking status: Former Smoker -- 0.50 packs/day for 40 years    Types: Cigarettes    Quit date: 06/16/2008  . Smokeless tobacco: Never Used  . Alcohol Use: No  . Drug Use: No  . Sexual Activity: Yes    Birth Control/ Protection: None   Other Topics Concern  . Not on file   Social History Narrative      ROS:  General: Negative for anorexia, weight loss, fever, chills, fatigue, weakness. Eyes: Negative for vision changes.  ENT: Negative for hoarseness, difficulty swallowing , nasal congestion. CV: Negative for chest pain, angina, palpitations, dyspnea on exertion, peripheral edema.  Respiratory: Negative for dyspnea at rest, dyspnea on exertion, cough, sputum, wheezing.  GI: See history of present illness. GU:  Negative for dysuria, hematuria, urinary incontinence, urinary frequency, nocturnal urination.  MS: Negative for joint pain, low back pain.  Derm: Negative for rash or itching.  Neuro: Negative for weakness, abnormal sensation, seizure, frequent headaches, memory loss, confusion.  Psych: Negative for anxiety, depression, suicidal ideation, hallucinations.  Endo: Negative for unusual weight change.  Heme: Negative  for bruising or bleeding. Allergy: Negative for rash or hives.    Physical Examination:  BP 164/82 mmHg  Pulse 65  Temp(Src) 98.4 F (36.9 C) (Oral)  Ht 6' (1.829 m)  Wt 196 lb 9.6 oz (89.177 kg)  BMI 26.66 kg/m2   General: Well-nourished, well-developed in no acute distress.  Head: Normocephalic, atraumatic.   Eyes: Conjunctiva pink, no icterus. Mouth: Oropharyngeal mucosa moist and pink , no lesions erythema or exudate. Neck: Supple without thyromegaly, masses, or lymphadenopathy.  Lungs: Clear to auscultation bilaterally.  Heart: Regular rate and rhythm, no murmurs rubs or gallops.  Abdomen: Bowel sounds are normal, nontender, nondistended, no hepatosplenomegaly or masses, no abdominal bruits or    hernia , no rebound or guarding.   Rectal: Deferred Extremities: No lower extremity edema. No clubbing or deformities.  Neuro: Alert and oriented x 4 , grossly normal neurologically.  Skin: Warm and dry, no rash or jaundice.   Psych: Alert and cooperative, normal mood and affect.  Labs: Lab Results  Component Value Date   WBC 5.2 04/09/2015   HGB 15.6 04/09/2015   HCT 44.5 04/09/2015   MCV 85.7 04/09/2015   PLT 104* 04/09/2015  PLT 185,000 11/2014.  Lab Results  Component Value Date   CREATININE 1.21 04/09/2015   BUN 12 04/09/2015   NA 135 04/09/2015   K 3.6 04/09/2015   CL 106 04/09/2015   CO2 24 04/09/2015   Lab Results  Component Value Date   ALT 13* 04/09/2015   AST 16 04/09/2015   ALKPHOS 91 04/09/2015   BILITOT 0.4 04/09/2015   Lab Results  Component Value Date   INR 1.94* 04/09/2015   INR 3.6 03/10/2015   INR 3.0 02/10/2015     Imaging Studies: No results found.

## 2015-05-14 NOTE — Assessment & Plan Note (Signed)
Due for surveillance colonoscopy given history of multiple adenomatous colon polyps. Suboptimal prep last time. Discussed prep extensively with patient today. He will stop Coumadin 4 days before procedure. Start Dulcolax 10 mg daily 3 days before bowel prep. 2 full days of clear liquids. Split prep utilizing TriLyte.  I have discussed the risks, alternatives, benefits with regards to but not limited to the risk of reaction to medication, bleeding, infection, perforation and the patient is agreeable to proceed. Written consent to be obtained.

## 2015-05-14 NOTE — Progress Notes (Signed)
Patient is scheduled for colonoscopy and upper endoscopy with esophageal dilation. Is it ok to hold his coumadin 4 days before procedure?

## 2015-05-14 NOTE — Progress Notes (Signed)
cc'ed to pcp °

## 2015-05-14 NOTE — Assessment & Plan Note (Signed)
History of erosive/ulcerative reflux esophagitis in 2013 with soft peptic stricture requiring dilation. Overall his symptoms of heartburn are fairly well-controlled on Protonix. He has had gradual recurrence of dysphagia and likely has developed esophageal stricture or ring. Offered upper endoscopy with dilation in the near future with Dr. Gala Romney. We will hold Coumadin 4 days prior to procedure. Cardiology has been notified who plans to hold Coumadin.  I have discussed the risks, alternatives, benefits with regards to but not limited to the risk of reaction to medication, bleeding, infection, perforation and the patient is agreeable to proceed. Written consent to be obtained.

## 2015-05-22 ENCOUNTER — Encounter (HOSPITAL_COMMUNITY): Payer: Self-pay

## 2015-05-22 ENCOUNTER — Encounter (HOSPITAL_COMMUNITY)
Admission: RE | Disposition: A | Discharge status: Home / Self Care | Payer: Self-pay | Source: Ambulatory Visit | Attending: Internal Medicine

## 2015-05-22 ENCOUNTER — Ambulatory Visit (HOSPITAL_COMMUNITY)
Admission: RE | Admit: 2015-05-22 | Discharge: 2015-05-22 | Disposition: A | Discharge status: Home / Self Care | Payer: Medicare Other | Source: Ambulatory Visit | Attending: Internal Medicine | Admitting: Internal Medicine

## 2015-05-22 DIAGNOSIS — I10 Essential (primary) hypertension: Secondary | ICD-10-CM | POA: Diagnosis not present

## 2015-05-22 DIAGNOSIS — R1310 Dysphagia, unspecified: Secondary | ICD-10-CM | POA: Diagnosis not present

## 2015-05-22 DIAGNOSIS — Z800 Family history of malignant neoplasm of digestive organs: Secondary | ICD-10-CM | POA: Diagnosis not present

## 2015-05-22 DIAGNOSIS — K449 Diaphragmatic hernia without obstruction or gangrene: Secondary | ICD-10-CM

## 2015-05-22 DIAGNOSIS — Z79899 Other long term (current) drug therapy: Secondary | ICD-10-CM | POA: Diagnosis not present

## 2015-05-22 DIAGNOSIS — I2510 Atherosclerotic heart disease of native coronary artery without angina pectoris: Secondary | ICD-10-CM | POA: Insufficient documentation

## 2015-05-22 DIAGNOSIS — Z7901 Long term (current) use of anticoagulants: Secondary | ICD-10-CM | POA: Insufficient documentation

## 2015-05-22 DIAGNOSIS — Z87891 Personal history of nicotine dependence: Secondary | ICD-10-CM | POA: Insufficient documentation

## 2015-05-22 DIAGNOSIS — E785 Hyperlipidemia, unspecified: Secondary | ICD-10-CM | POA: Diagnosis not present

## 2015-05-22 DIAGNOSIS — Z1211 Encounter for screening for malignant neoplasm of colon: Secondary | ICD-10-CM | POA: Diagnosis not present

## 2015-05-22 DIAGNOSIS — K621 Rectal polyp: Secondary | ICD-10-CM | POA: Diagnosis not present

## 2015-05-22 DIAGNOSIS — R1319 Other dysphagia: Secondary | ICD-10-CM | POA: Insufficient documentation

## 2015-05-22 DIAGNOSIS — I252 Old myocardial infarction: Secondary | ICD-10-CM | POA: Diagnosis not present

## 2015-05-22 DIAGNOSIS — D128 Benign neoplasm of rectum: Secondary | ICD-10-CM | POA: Diagnosis not present

## 2015-05-22 DIAGNOSIS — I4891 Unspecified atrial fibrillation: Secondary | ICD-10-CM | POA: Insufficient documentation

## 2015-05-22 DIAGNOSIS — D124 Benign neoplasm of descending colon: Secondary | ICD-10-CM | POA: Diagnosis not present

## 2015-05-22 DIAGNOSIS — D123 Benign neoplasm of transverse colon: Secondary | ICD-10-CM | POA: Insufficient documentation

## 2015-05-22 DIAGNOSIS — Z86010 Personal history of colonic polyps: Secondary | ICD-10-CM | POA: Insufficient documentation

## 2015-05-22 DIAGNOSIS — D122 Benign neoplasm of ascending colon: Secondary | ICD-10-CM | POA: Diagnosis not present

## 2015-05-22 HISTORY — PX: COLONOSCOPY: SHX5424

## 2015-05-22 HISTORY — PX: ESOPHAGEAL DILATION: SHX303

## 2015-05-22 HISTORY — PX: ESOPHAGOGASTRODUODENOSCOPY: SHX5428

## 2015-05-22 SURGERY — COLONOSCOPY
Anesthesia: Moderate Sedation

## 2015-05-22 MED ORDER — ONDANSETRON HCL 4 MG/2ML IJ SOLN
INTRAMUSCULAR | Status: AC
  Filled 2015-05-22: qty 2

## 2015-05-22 MED ORDER — MEPERIDINE HCL 100 MG/ML IJ SOLN
INTRAMUSCULAR | Status: AC
  Filled 2015-05-22: qty 2

## 2015-05-22 MED ORDER — LIDOCAINE VISCOUS 2 % MT SOLN
OROMUCOSAL | Status: AC
  Filled 2015-05-22: qty 15

## 2015-05-22 MED ORDER — MIDAZOLAM HCL 5 MG/5ML IJ SOLN
INTRAMUSCULAR | Status: DC | PRN
  Administered 2015-05-22 (×5): 1 mg via INTRAVENOUS
  Administered 2015-05-22 (×2): 2 mg via INTRAVENOUS

## 2015-05-22 MED ORDER — MIDAZOLAM HCL 5 MG/5ML IJ SOLN
INTRAMUSCULAR | Status: AC
  Filled 2015-05-22: qty 10

## 2015-05-22 MED ORDER — SODIUM CHLORIDE 0.9 % IV SOLN
INTRAVENOUS | Status: DC
  Administered 2015-05-22: 14:00:00 via INTRAVENOUS

## 2015-05-22 MED ORDER — STERILE WATER FOR IRRIGATION IR SOLN
Status: DC | PRN
  Administered 2015-05-22: 2.5 mL

## 2015-05-22 MED ORDER — LIDOCAINE VISCOUS 2 % MT SOLN
OROMUCOSAL | Status: DC | PRN
  Administered 2015-05-22: 1 via OROMUCOSAL

## 2015-05-22 MED ORDER — MEPERIDINE HCL 100 MG/ML IJ SOLN
INTRAMUSCULAR | Status: DC | PRN
  Administered 2015-05-22 (×2): 25 mg via INTRAVENOUS
  Administered 2015-05-22: 50 mg via INTRAVENOUS

## 2015-05-22 NOTE — Op Note (Signed)
[facility] [address] [place], [address]   COLONOSCOPY PROCEDURE REPORT  PATIENT: [name] [name], [name] [name]  MR#: [id number] BIRTHDATE: 1950/05/08 , 64  yrs. old GENDER: male ENDOSCOPIST: R.  Garfield Cornea, MD FACP [facility] REFERRED [id number] [place], M.D. PROCEDURE DATE:  06/07/2015 PROCEDURE:   Colonoscopy with snare polypectomy and ablation INDICATIONS:History of colonic adenoma; surveillance examination. MEDICATIONS: Versed 9 mg IV and Demerol 100 mg IV in divided doses. Zofran 4 mg IV. ASA CLASS:       Class II  CONSENT: The risks, benefits, alternatives and imponderables including but not limited to bleeding, perforation as well as the possibility of a missed lesion have been reviewed.  The potential for biopsy, lesion removal, etc. have also been discussed. Questions have been answered.  All parties agreeable.  Please see the history and physical in the medical record for more information.  DESCRIPTION OF PROCEDURE:   After the risks benefits and alternatives of the procedure were thoroughly explained, informed consent was obtained.  The digital rectal exam revealed no abnormalities of the rectum.   The [id number]  endoscope was introduced through the anus and advanced to the cecum, which was identified by both the appendix and ileocecal valve. No adverse events experienced.   The quality of the prep was adequate  The instrument was then slowly withdrawn as the colon was fully examined. Estimated blood loss is zero unless otherwise noted in this procedure report.      COLON FINDINGS: (3) polyps in the rectum at 5 cm from anal verge (1) 6 mm dimensions (2) diminutive in size.  Patient had (3) colon polyps in the descending, (3) in the transverse and (3) in the descending segments.  the largest being 1.2 cm in dimensions.  All the polyps were either ablated, hot or cold snare- removed. Retroflexion was performed. .  Withdrawal time=25 minutes 0  seconds.  The scope was withdrawn and the procedure completed. COMPLICATIONS: There were no immediate complications. 5 mL ENDOSCOPIC IMPRESSION: Multiple colonic and rectal polyps removed or ablated as described above.  RECOMMENDATIONS: Follow-up on pathology. Resume Coumadin tomorrow.  eSigned:  R. Garfield Cornea, MD Rosalita Chessman [facility] 06/07/2015 3:18 PM   cc:  CPT CODES: ICD CODES:  The ICD and CPT codes recommended by this software are interpretations from the data that the clinical staff has captured with the software.  The verification of the translation of this report to the ICD and CPT codes and modifiers is the sole responsibility of the health care institution and practicing physician where this report was generated.  [employer] will not be held responsible for the validity of the ICD and CPT codes included on this report.  AMA assumes no liability for data contained or not contained herein. CPT is a Designer, television/film set of the [employer].  PATIENT NAME:  [name] [name], [name] [name] MR#: [id number]

## 2015-05-22 NOTE — Discharge Instructions (Signed)
Colonoscopy Discharge Instructions  Read the instructions outlined below and refer to this sheet in the next few weeks. These discharge instructions provide you with general information on caring for yourself after you leave the hospital. Your doctor may also give you specific instructions. While your treatment has been planned according to the most current medical practices available, unavoidable complications occasionally occur. If you have any problems or questions after discharge, call Dr. Gala [name] at [phone]. ACTIVITY  You may resume your regular activity, but move at a slower pace for the next 24 hours.   Take frequent rest periods for the next 24 hours.   Walking will help get rid of the air and reduce the bloated feeling in your belly (abdomen).   No driving for 24 hours (because of the medicine (anesthesia) used during the test).    Do not sign any important legal documents or operate any machinery for 24 hours (because of the anesthesia used during the test).  NUTRITION  Drink plenty of fluids.   You may resume your normal diet as instructed by your doctor.   Begin with a light meal and progress to your normal diet. Heavy or fried foods are harder to digest and may make you feel sick to your stomach (nauseated).   Avoid alcoholic beverages for 24 hours or as instructed.  MEDICATIONS  You may resume your normal medications unless your doctor tells you otherwise.  WHAT YOU CAN EXPECT TODAY  Some feelings of bloating in the abdomen.   Passage of more gas than usual.   Spotting of blood in your stool or on the toilet paper.  IF YOU HAD POLYPS REMOVED DURING THE COLONOSCOPY:  No aspirin products for 7 days or as instructed.   No alcohol for 7 days or as instructed.   Eat a soft diet for the next 24 hours.  FINDING OUT THE RESULTS OF YOUR TEST Not all test results are available during your visit. If your test results are not back during the visit, make an appointment  with your caregiver to find out the results. Do not assume everything is normal if you have not heard from your caregiver or the medical facility. It is important for you to follow up on all of your test results.  SEEK IMMEDIATE MEDICAL ATTENTION IF:  You have more than a spotting of blood in your stool.   Your belly is swollen (abdominal distention).   You are nauseated or vomiting.   You have a temperature over 101.  You have abdominal pain or discomfort that is severe or gets worse throughout the day.  Colonoscopy Discharge Instructions  Read the instructions outlined below and refer to this sheet in the next few weeks. These discharge instructions provide you with general information on caring for yourself after you leave the hospital. Your doctor may also give you specific instructions. While your treatment has been planned according to the most current medical practices available, unavoidable complications occasionally occur. If you have any problems or questions after discharge, call Dr. Gala [name] at [phone]. ACTIVITY You may resume your regular activity, but move at a slower pace for the next 24 hours.  Take frequent rest periods for the next 24 hours.  Walking will help get rid of the air and reduce the bloated feeling in your belly (abdomen).  No driving for 24 hours (because of the medicine (anesthesia) used during the test).   Do not sign any important legal documents or operate any machinery for 24 hours (  because of the anesthesia used during the test).  NUTRITION Drink plenty of fluids.  You may resume your normal diet as instructed by your doctor.  Begin with a light meal and progress to your normal diet. Heavy or fried foods are harder to digest and may make you feel sick to your stomach (nauseated).  Avoid alcoholic beverages for 24 hours or as instructed.  MEDICATIONS You may resume your normal medications unless your doctor tells you otherwise.  WHAT YOU CAN EXPECT  TODAY Some feelings of bloating in the abdomen.  Passage of more gas than usual.  Spotting of blood in your stool or on the toilet paper.  IF YOU HAD POLYPS REMOVED DURING THE COLONOSCOPY: No aspirin products for 7 days or as instructed.  No alcohol for 7 days or as instructed.  Eat a soft diet for the next 24 hours.  FINDING OUT THE RESULTS OF YOUR TEST Not all test results are available during your visit. If your test results are not back during the visit, make an appointment with your caregiver to find out the results. Do not assume everything is normal if you have not heard from your caregiver or the medical facility. It is important for you to follow up on all of your test results.  SEEK IMMEDIATE MEDICAL ATTENTION IF: You have more than a spotting of blood in your stool.  Your belly is swollen (abdominal distention).  You are nauseated or vomiting.  You have a temperature over 101.   You have abdominal pain or discomfort that is severe or gets worse throughout the day.   EGD Discharge instructions Please read the instructions outlined below and refer to this sheet in the next few weeks. These discharge instructions provide you with general information on caring for yourself after you leave the hospital. Your doctor may also give you specific instructions. While your treatment has been planned according to the most current medical practices available, unavoidable complications occasionally occur. If you have any problems or questions after discharge, please call your doctor. ACTIVITY  You may resume your regular activity but move at a slower pace for the next 24 hours.   Take frequent rest periods for the next 24 hours.   Walking will help expel (get rid of) the air and reduce the bloated feeling in your abdomen.   No driving for 24 hours (because of the anesthesia (medicine) used during the test).   You may shower.   Do not sign any important legal documents or operate any  machinery for 24 hours (because of the anesthesia used during the test).  NUTRITION  Drink plenty of fluids.   You may resume your normal diet.   Begin with a light meal and progress to your normal diet.   Avoid alcoholic beverages for 24 hours or as instructed by your caregiver.  MEDICATIONS  You may resume your normal medications unless your caregiver tells you otherwise.  WHAT YOU CAN EXPECT TODAY  You may experience abdominal discomfort such as a feeling of fullness or gas pains.  FOLLOW-UP  Your doctor will discuss the results of your test with you.  SEEK IMMEDIATE MEDICAL ATTENTION IF ANY OF THE FOLLOWING OCCUR:  Excessive nausea (feeling sick to your stomach) and/or vomiting.   Severe abdominal pain and distention (swelling).   Trouble swallowing.   Temperature over 101 F (37.8 C).   Rectal bleeding or vomiting of blood.    Resume Coumadin tomorrow  GERD information provided  Colon polyp  information provided  Stop Protonix; begin a trial of Dexilant 60 mg daily-go by my office for supply of free samples  Office visit with [place] in 3 months  Further recommendations to follow pending review of pathology report   Colon Polyps Polyps are lumps of extra tissue growing inside the body. Polyps can grow in the large intestine (colon). Most colon polyps are noncancerous (benign). However, some colon polyps can become cancerous over time. Polyps that are larger than a pea may be harmful. To be safe, caregivers remove and test all polyps. CAUSES  Polyps form when mutations in the genes cause your cells to grow and divide even though no more tissue is needed. RISK FACTORS There are a number of risk factors that can increase your chances of getting colon polyps. They include:  Being older than 50 years.  Family history of colon polyps or colon cancer.  Long-term colon diseases, such as colitis or Crohn disease.  Being overweight.  Smoking.  Being  inactive.  Drinking too much alcohol. SYMPTOMS  Most small polyps do not cause symptoms. If symptoms are present, they may include:  Blood in the stool. The stool may look dark red or black.  Constipation or diarrhea that lasts longer than 1 week. DIAGNOSIS People often do not know they have polyps until their caregiver finds them during a regular checkup. Your caregiver can use 4 tests to check for polyps:  Digital rectal exam. The caregiver wears gloves and feels inside the rectum. This test would find polyps only in the rectum.  Barium enema. The caregiver puts a liquid called barium into your rectum before taking X-rays of your colon. Barium makes your colon look white. Polyps are dark, so they are easy to see in the X-ray pictures.  Sigmoidoscopy. A thin, flexible tube (sigmoidoscope) is placed into your rectum. The sigmoidoscope has a light and tiny camera in it. The caregiver uses the sigmoidoscope to look at the last third of your colon.  Colonoscopy. This test is like sigmoidoscopy, but the caregiver looks at the entire colon. This is the most common method for finding and removing polyps. TREATMENT  Any polyps will be removed during a sigmoidoscopy or colonoscopy. The polyps are then tested for cancer. PREVENTION  To help lower your risk of getting more colon polyps:  Eat plenty of fruits and vegetables. Avoid eating fatty foods.  Do not smoke.  Avoid drinking alcohol.  Exercise every day.  Lose weight if recommended by your caregiver.  Eat plenty of calcium and folate. Foods that are rich in calcium include milk, cheese, and broccoli. Foods that are rich in folate include chickpeas, kidney beans, and spinach. HOME CARE INSTRUCTIONS Keep all follow-up appointments as directed by your caregiver. You may need periodic exams to check for polyps. SEEK MEDICAL CARE IF: You notice bleeding during a bowel movement.   This information is not intended to replace advice given  to you by your health care provider. Make sure you discuss any questions you have with your health care provider.   Document Released: 01/14/2004 Document Revised: 05/10/2014 Document Reviewed: 06/29/2011 Elsevier Interactive Patient Education 2016 [place]. Gastroesophageal Reflux Disease, Adult Normally, food travels down the esophagus and stays in the stomach to be digested. However, when a person has gastroesophageal reflux disease (GERD), food and stomach acid move back up into the esophagus. When this happens, the esophagus becomes sore and inflamed. Over time, GERD can create small holes (ulcers) in the lining of the esophagus.  CAUSES This condition is caused by a problem with the muscle between the esophagus and the stomach (lower esophageal sphincter, or LES). Normally, the LES muscle closes after food passes through the esophagus to the stomach. When the LES is weakened or abnormal, it does not close properly, and that allows food and stomach acid to go back up into the esophagus. The LES can be weakened by certain dietary substances, medicines, and medical conditions, including:  Tobacco use.  Pregnancy.  Having a hiatal hernia.  Heavy alcohol use.  Certain foods and beverages, such as coffee, chocolate, onions, and peppermint. RISK FACTORS This condition is more likely to develop in:  People who have an increased body weight.  People who have connective tissue disorders.  People who use NSAID medicines. SYMPTOMS Symptoms of this condition include:  Heartburn.  Difficult or painful swallowing.  The feeling of having a lump in the throat.  Abitter taste in the mouth.  Bad breath.  Having a large amount of saliva.  Having an upset or bloated stomach.  Belching.  Chest pain.  Shortness of breath or wheezing.  Ongoing (chronic) cough or a night-time cough.  Wearing away of tooth enamel.  Weight loss. Different conditions can cause chest pain. Make  sure to see your health care provider if you experience chest pain. DIAGNOSIS Your health care provider will take a medical history and perform a physical exam. To determine if you have mild or severe GERD, your health care provider may also monitor how you respond to treatment. You may also have other tests, including:  An endoscopy toexamine your stomach and esophagus with a small camera.  A test thatmeasures the acidity level in your esophagus.  A test thatmeasures how much pressure is on your esophagus.  A barium swallow or modified barium swallow to show the shape, size, and functioning of your esophagus. TREATMENT The goal of treatment is to help relieve your symptoms and to prevent complications. Treatment for this condition may vary depending on how severe your symptoms are. Your health care provider may recommend:  Changes to your diet.  Medicine.  Surgery. HOME CARE INSTRUCTIONS Diet  Follow a diet as recommended by your health care provider. This may involve avoiding foods and drinks such as:  Coffee and tea (with or without caffeine).  Drinks that containalcohol.  Energy drinks and sports drinks.  Carbonated drinks or sodas.  Chocolate and cocoa.  Peppermint and mint flavorings.  Garlic and onions.  Horseradish.  Spicy and acidic foods, including peppers, chili powder, curry powder, vinegar, hot sauces, and barbecue sauce.  Citrus fruit juices and citrus fruits, such as oranges, lemons, and limes.  Tomato-based foods, such as red sauce, chili, salsa, and pizza with red sauce.  Fried and fatty foods, such as donuts, french fries, potato chips, and high-fat dressings.  High-fat meats, such as hot dogs and fatty cuts of red and white meats, such as rib eye steak, sausage, ham, and bacon.  High-fat dairy items, such as whole milk, butter, and cream cheese.  Eat small, frequent meals instead of large meals.  Avoid drinking large amounts of liquid with  your meals.  Avoid eating meals during the 2-3 hours before bedtime.  Avoid lying down right after you eat.  Do not exercise right after you eat. General Instructions  Pay attention to any changes in your symptoms.  Take over-the-counter and prescription medicines only as told by your health care provider. Do not take aspirin, ibuprofen, or other NSAIDs  unless your health care provider told you to do so.  Do not use any tobacco products, including cigarettes, chewing tobacco, and e-cigarettes. If you need help quitting, ask your health care provider.  Wear loose-fitting clothing. Do not wear anything tight around your waist that causes pressure on your abdomen.  Raise (elevate) the head of your bed 6 inches (15cm).  Try to reduce your stress, such as with yoga or meditation. If you need help reducing stress, ask your health care provider.  If you are overweight, reduce your weight to an amount that is healthy for you. Ask your health care provider for guidance about a safe weight loss goal.  Keep all follow-up visits as told by your health care provider. This is important. SEEK MEDICAL CARE IF:  You have new symptoms.  You have unexplained weight loss.  You have difficulty swallowing, or it hurts to swallow.  You have wheezing or a persistent cough.  Your symptoms do not improve with treatment.  You have a hoarse voice. SEEK IMMEDIATE MEDICAL CARE IF:  You have pain in your arms, neck, jaw, teeth, or back.  You feel sweaty, dizzy, or light-headed.  You have chest pain or shortness of breath.  You vomit and your vomit looks like blood or coffee grounds.  You faint.  Your stool is bloody or black.  You cannot swallow, drink, or eat.   This information is not intended to replace advice given to you by your health care provider. Make sure you discuss any questions you have with your health care provider.   Document Released: 01/27/2005 Document Revised: 01/08/2015  Document Reviewed: 08/14/2014 Elsevier Interactive Patient Education [employer].

## 2015-05-22 NOTE — H&P (View-Only) (Signed)
Primary Care Physician:  Vic Blackbird, MD  Primary Gastroenterologist:  Garfield Cornea, MD   Chief Complaint  Patient presents with  . Colonoscopy    HPI:  [name] [name] is a 65 y.o. male here to schedule surveillance colonoscopy. Last colonoscopy was in March 2013. Prior attempt in February 2013 unsuccessful due to an adequate bowel prep. Patient had misunderstood directions and 8 solid food after bowel prep. In March 2013 he had suboptimal preparation. Multiple polyps removed from the rectum and hepatic and splenic flexures. Multiple tubular adenomas. Advised come back in 3 years for surveillance. Patient had last EGD in February 2013 with severe/ulcerative reflux esophagitis and was soft peptic stricture requiring dilation.  Complains of recurrent dysphagia to solid foods, not quite as bad as before but has been progressive. Some heartburn more recently. Takes pantoprazole daily. Recently developed constipation in the setting of upper respiratory infection. PCP provided with samples of Linzess. Initial stool was "black as tar". BM daily now. No brbpr. No n/v. No unintentional weight loss.  Current Outpatient Prescriptions  Medication Sig Dispense Refill  . albuterol (PROVENTIL HFA;VENTOLIN HFA) 108 (90 BASE) MCG/ACT inhaler Inhale 2-4 puffs into the lungs every 4 (four) hours as needed for wheezing or shortness of breath. 1 Inhaler 0  . amLODipine (NORVASC) 10 MG tablet take 1 tablet by mouth once daily 90 tablet 3  . atorvastatin (LIPITOR) 20 MG tablet Take 1 tablet (20 mg total) by mouth at bedtime. 30 tablet 3  . cloNIDine (CATAPRES) 0.1 MG tablet Take 1 tablet (0.1 mg total) by mouth 2 (two) times daily. 180 tablet 3  . diphenhydrAMINE (BENADRYL) 25 MG tablet Take 25 mg by mouth daily as needed for allergies.    . fexofenadine (ALLEGRA) 180 MG tablet Take 1 tablet (180 mg total) by mouth daily. 30 tablet 2  . lisinopril (PRINIVIL,ZESTRIL) 40 MG tablet Take 1 tablet (40 mg total) by  mouth daily. 90 tablet 3  . Multiple Vitamin (MULITIVITAMIN WITH MINERALS) TABS Take 1 tablet by mouth daily.    . nitroGLYCERIN (NITROSTAT) 0.4 MG SL tablet place 1 tablet under the tongue if needed every 5 minutes for chest pain for 3 doses IF NO RELIEF AFTER 3RD DOSE CALL PRESCRIBER OR 911. 25 tablet 2  . Omega-3 Fatty Acids (FISH OIL) 1000 MG CAPS Take 1 capsule (1,000 mg total) by mouth 2 (two) times daily.  0  . pantoprazole (PROTONIX) 40 MG tablet Take 1 tablet (40 mg total) by mouth daily. 90 tablet 1  . traZODone (DESYREL) 50 MG tablet Take 0.5-1 tablets (25-50 mg total) by mouth at bedtime as needed for sleep. 30 tablet 3  . warfarin (COUMADIN) 5 MG tablet Take 1 1/2 tablets daily 45 tablet 6   No current facility-administered medications for this visit.    Allergies as of 05/14/2015  . (No Known Allergies)    Past Medical History  Diagnosis Date  . Arteriosclerotic cardiovascular disease (ASCVD)     AMI in 2000 treated at [facility]; cath in 12/2006->  Chronic total obstruction of the RCA;  drug-eluting stent placed in M1; inferior hypokinesis with an EF of 45%  . Tobacco abuse, in remission     20 pack years; quit in 2009  . Hypertension   . Chronic anticoagulation   . Atrial fibrillation ([place])     on coumadin for couple of years, stopped plavix at that time  . PVD (peripheral vascular disease) ([place])     Ct angiogram in 2009  revealed stable disease with 80% celiac stenosis,50% right renal artery ,ASCVD with ulceration in the abdominal aortashe  . Nephrolithiasis   . Cerebrovascular disease 2002    carotid stent  . Hyperlipidemia   . Myocardial infarction ([place]) 10 yrs ago  . Dysphagia   . Testicular carcinoma [facility]) 1990    right orchiectomy    Past Surgical History  Procedure Laterality Date  . Appendectomy  2004  . Testicular cancer  1990    right orchiectomy  . Colonoscopy  06/17/2011    INCOMPLETE, PREP POOR. Procedure: COLONOSCOPY;  Surgeon: Daneil Dolin, MD;   Location: [facility];  Service: Endoscopy;  Laterality: N/A;  10:00  . Esophagogastroduodenoscopy  06/17/2011    severe erosive/ulcerative reflux esophagitis, soft noncritical stricture dilatied, small hh, antral erosion   . Colonoscopy  07/15/2011    [id number] rectal and colon polyps    Family History  Problem Relation Age of Onset  . Colon cancer Father 12    deceased  . Prostate cancer Father   . Liver disease Neg Hx   . Anesthesia problems Neg Hx   . Hypotension Neg Hx   . Malignant hyperthermia Neg Hx   . Pseudochol deficiency Neg Hx     Social History   Social History  . Marital Status: Married    Spouse Name: N/A  . Number of Children: 2  . Years of Education: N/A   Occupational History  . disabled    Social History Main Topics  . Smoking status: Former Smoker -- 0.50 packs/day for 40 years    Types: Cigarettes    Quit date: 06/16/2008  . Smokeless tobacco: Never Used  . Alcohol Use: No  . Drug Use: No  . Sexual Activity: Yes    Birth Control/ Protection: None   Other Topics Concern  . Not on file   Social History Narrative      ROS:  General: Negative for anorexia, weight loss, fever, chills, fatigue, weakness. Eyes: Negative for vision changes.  ENT: Negative for hoarseness, difficulty swallowing , nasal congestion. CV: Negative for chest pain, angina, palpitations, dyspnea on exertion, peripheral edema.  Respiratory: Negative for dyspnea at rest, dyspnea on exertion, cough, sputum, wheezing.  GI: See history of present illness. GU:  Negative for dysuria, hematuria, urinary incontinence, urinary frequency, nocturnal urination.  MS: Negative for joint pain, low back pain.  Derm: Negative for rash or itching.  Neuro: Negative for weakness, abnormal sensation, seizure, frequent headaches, memory loss, confusion.  Psych: Negative for anxiety, depression, suicidal ideation, hallucinations.  Endo: Negative for unusual weight change.  Heme: Negative  for bruising or bleeding. Allergy: Negative for rash or hives.    Physical Examination:  BP 164/82 mmHg  Pulse 65  Temp(Src) 98.4 F (36.9 C) (Oral)  Ht 6' (1.829 m)  Wt 196 lb 9.6 oz (89.177 kg)  BMI 26.66 kg/m2   General: Well-nourished, well-developed in no acute distress.  Head: Normocephalic, atraumatic.   Eyes: Conjunctiva pink, no icterus. Mouth: Oropharyngeal mucosa moist and pink , no lesions erythema or exudate. Neck: Supple without thyromegaly, masses, or lymphadenopathy.  Lungs: Clear to auscultation bilaterally.  Heart: Regular rate and rhythm, no murmurs rubs or gallops.  Abdomen: Bowel sounds are normal, nontender, nondistended, no hepatosplenomegaly or masses, no abdominal bruits or    hernia , no rebound or guarding.   Rectal: Deferred Extremities: No lower extremity edema. No clubbing or deformities.  Neuro: Alert and oriented x 4 , grossly normal neurologically.  Skin: Warm and dry, no rash or jaundice.   Psych: Alert and cooperative, normal mood and affect.  Labs: Lab Results  Component Value Date   WBC 5.2 04/09/2015   HGB 15.6 04/09/2015   HCT 44.5 04/09/2015   MCV 85.7 04/09/2015   PLT 104* 04/09/2015  PLT 185,000 11/2014.  Lab Results  Component Value Date   CREATININE 1.21 04/09/2015   BUN 12 04/09/2015   NA 135 04/09/2015   K 3.6 04/09/2015   CL 106 04/09/2015   CO2 24 04/09/2015   Lab Results  Component Value Date   ALT 13* 04/09/2015   AST 16 04/09/2015   ALKPHOS 91 04/09/2015   BILITOT 0.4 04/09/2015   Lab Results  Component Value Date   INR 1.94* 04/09/2015   INR 3.6 03/10/2015   INR 3.0 02/10/2015     Imaging Studies: No results found.

## 2015-05-22 NOTE — Op Note (Signed)
[facility] [address] [place], [address]   ENDOSCOPY PROCEDURE REPORT  PATIENT: [name] [name], [name] [name]  MR#: [id number] BIRTHDATE: 1950/10/15 , 64  yrs. old GENDER: male ENDOSCOPIST: R.  Garfield Cornea, MD FACP FACG REFERRED BY:  Vic Blackbird, M.D. PROCEDURE DATE:  05/31/2015 PROCEDURE:  EGD, diagnostic and Maloney dilation of esophagus INDICATIONS:  breakthrough GERD on therapy; esophageal dysphagia. MEDICATIONS: Versed 4 mg IV and Demerol 75 mg IV in divided doses. Zofran 4 mg IV.  Xylocaine gel orally. ASA CLASS:      Class II  CONSENT: The risks, benefits, limitations, alternatives and imponderables have been discussed.  The potential for biopsy, esophogeal dilation, etc. have also been reviewed.  Questions have been answered.  All parties agreeable.  Please see the history and physical in the medical record for more information.  DESCRIPTION OF PROCEDURE: After the risks benefits and alternatives of the procedure were thoroughly explained, informed consent was obtained.  The [id number] endoscope was introduced through the mouth and advanced to the second portion of the duodenum , limited by Without limitations. The instrument was slowly withdrawn as the mucosa was fully examined. Estimated blood loss is zero unless otherwise noted in this procedure report.    Tubular esophagus appeared normal.  It was patent throughout its course.  No Barrett's epithelium, tumor or other abnormality. Stomach empty.  2 cm hiatal hernia present.  Normal-appearing gastric mucosa.  Patent pylorus.  Normal-appearing first and second portion of the duodenum. Sscope was withdrawn and a 56 [place] Maloney dilator was passed easily.  A look back reveal no apparent complications related to this maneuver.  Retroflexed views revealed as previously described. The scope was then withdrawn from the patient and the procedure completed.  COMPLICATIONS: There were no immediate  complications.  ENDOSCOPIC IMPRESSION: 2 cm hiatal hernia; otherwise normal appearing EGD. Status post past several Maloney dilator  RECOMMENDATIONS: Stop Protonix; trial of Dexilant 60 mg daily. Office visit with [place] in 6 weeks  REPEAT EXAM:  eSigned:  R. Garfield Cornea, MD Rosalita Chessman [facility] May 31, 2015 2:19 PM    CC:  CPT CODES: ICD CODES:  The ICD and CPT codes recommended by this software are interpretations from the data that the clinical staff has captured with the software.  The verification of the translation of this report to the ICD and CPT codes and modifiers is the sole responsibility of the health care institution and practicing physician where this report was generated.  [employer] will not be held responsible for the validity of the ICD and CPT codes included on this report.  AMA assumes no liability for data contained or not contained herein. CPT is a Designer, television/film set of the [employer].

## 2015-05-22 NOTE — Interval H&P Note (Signed)
History and Physical Interval Note:  05/22/2015 1:57 PM  [name] [name]  has presented today for surgery, with the diagnosis of dysphagia, history of colon polyps  The various methods of treatment have been discussed with the patient and family. After consideration of risks, benefits and other options for treatment, the patient has consented to  Procedure(s) with comments: COLONOSCOPY (N/A) - 1300 - moved to 2:30 - office to notify ESOPHAGOGASTRODUODENOSCOPY (EGD) (N/A) ESOPHAGEAL DILATION (N/A) as a surgical intervention .  The patient's history has been reviewed, patient examined, no change in status, stable for surgery.  I have reviewed the patient's chart and labs.  Questions were answered to the patient's satisfaction.     [name] [name]  No change. Coumadin held down 5 days. EGD with ED, etc. and surveillance colonoscopy per plan. The risks, benefits, limitations, imponderables and alternatives regarding both EGD and colonoscopy have been reviewed with the patient. Questions have been answered. All parties agreeable.

## 2015-05-23 DIAGNOSIS — N189 Chronic kidney disease, unspecified: Secondary | ICD-10-CM | POA: Insufficient documentation

## 2015-05-23 DIAGNOSIS — I2583 Coronary atherosclerosis due to lipid rich plaque: Secondary | ICD-10-CM | POA: Insufficient documentation

## 2015-05-23 DIAGNOSIS — I722 Aneurysm of renal artery: Secondary | ICD-10-CM | POA: Diagnosis not present

## 2015-05-23 DIAGNOSIS — I252 Old myocardial infarction: Secondary | ICD-10-CM | POA: Diagnosis not present

## 2015-05-23 DIAGNOSIS — E785 Hyperlipidemia, unspecified: Secondary | ICD-10-CM | POA: Diagnosis not present

## 2015-05-23 DIAGNOSIS — Z7901 Long term (current) use of anticoagulants: Secondary | ICD-10-CM | POA: Diagnosis not present

## 2015-05-23 DIAGNOSIS — I739 Peripheral vascular disease, unspecified: Secondary | ICD-10-CM | POA: Insufficient documentation

## 2015-05-23 DIAGNOSIS — R1031 Right lower quadrant pain: Secondary | ICD-10-CM | POA: Diagnosis not present

## 2015-05-23 DIAGNOSIS — Z8547 Personal history of malignant neoplasm of testis: Secondary | ICD-10-CM | POA: Diagnosis not present

## 2015-05-23 DIAGNOSIS — I129 Hypertensive chronic kidney disease with stage 1 through stage 4 chronic kidney disease, or unspecified chronic kidney disease: Secondary | ICD-10-CM | POA: Insufficient documentation

## 2015-05-23 DIAGNOSIS — I4891 Unspecified atrial fibrillation: Secondary | ICD-10-CM | POA: Diagnosis not present

## 2015-05-23 DIAGNOSIS — I2510 Atherosclerotic heart disease of native coronary artery without angina pectoris: Secondary | ICD-10-CM | POA: Diagnosis not present

## 2015-05-23 DIAGNOSIS — Z87442 Personal history of urinary calculi: Secondary | ICD-10-CM | POA: Insufficient documentation

## 2015-05-23 DIAGNOSIS — N179 Acute kidney failure, unspecified: Secondary | ICD-10-CM | POA: Insufficient documentation

## 2015-05-23 DIAGNOSIS — I714 Abdominal aortic aneurysm, without rupture: Principal | ICD-10-CM | POA: Insufficient documentation

## 2015-05-23 DIAGNOSIS — F17210 Nicotine dependence, cigarettes, uncomplicated: Secondary | ICD-10-CM | POA: Diagnosis not present

## 2015-05-23 DIAGNOSIS — I708 Atherosclerosis of other arteries: Secondary | ICD-10-CM | POA: Diagnosis not present

## 2015-05-23 DIAGNOSIS — I701 Atherosclerosis of renal artery: Secondary | ICD-10-CM | POA: Diagnosis not present

## 2015-05-23 DIAGNOSIS — Z79899 Other long term (current) drug therapy: Secondary | ICD-10-CM | POA: Diagnosis not present

## 2015-05-23 DIAGNOSIS — R1084 Generalized abdominal pain: Secondary | ICD-10-CM | POA: Diagnosis not present

## 2015-05-23 DIAGNOSIS — Z955 Presence of coronary angioplasty implant and graft: Secondary | ICD-10-CM | POA: Diagnosis not present

## 2015-05-23 DIAGNOSIS — R109 Unspecified abdominal pain: Secondary | ICD-10-CM | POA: Diagnosis present

## 2015-05-24 ENCOUNTER — Encounter (HOSPITAL_COMMUNITY): Payer: Self-pay

## 2015-05-24 ENCOUNTER — Emergency Department (HOSPITAL_COMMUNITY): Payer: Medicare Other

## 2015-05-24 ENCOUNTER — Observation Stay (HOSPITAL_COMMUNITY)
Admission: EM | Admit: 2015-05-24 | Discharge: 2015-05-29 | Disposition: A | Discharge status: Home / Self Care | Payer: Medicare Other | Attending: Internal Medicine | Admitting: Internal Medicine

## 2015-05-24 DIAGNOSIS — R1031 Right lower quadrant pain: Secondary | ICD-10-CM | POA: Diagnosis not present

## 2015-05-24 DIAGNOSIS — Z7901 Long term (current) use of anticoagulants: Secondary | ICD-10-CM

## 2015-05-24 DIAGNOSIS — I10 Essential (primary) hypertension: Secondary | ICD-10-CM | POA: Diagnosis present

## 2015-05-24 DIAGNOSIS — E785 Hyperlipidemia, unspecified: Secondary | ICD-10-CM | POA: Diagnosis present

## 2015-05-24 DIAGNOSIS — Z720 Tobacco use: Secondary | ICD-10-CM | POA: Diagnosis present

## 2015-05-24 DIAGNOSIS — R1084 Generalized abdominal pain: Secondary | ICD-10-CM

## 2015-05-24 DIAGNOSIS — R109 Unspecified abdominal pain: Secondary | ICD-10-CM | POA: Diagnosis not present

## 2015-05-24 DIAGNOSIS — I4821 Permanent atrial fibrillation: Secondary | ICD-10-CM | POA: Diagnosis present

## 2015-05-24 DIAGNOSIS — R10A1 Flank pain, right side: Secondary | ICD-10-CM

## 2015-05-24 DIAGNOSIS — I2510 Atherosclerotic heart disease of native coronary artery without angina pectoris: Secondary | ICD-10-CM | POA: Diagnosis present

## 2015-05-24 DIAGNOSIS — N189 Chronic kidney disease, unspecified: Secondary | ICD-10-CM | POA: Diagnosis present

## 2015-05-24 DIAGNOSIS — N186 End stage renal disease: Secondary | ICD-10-CM | POA: Diagnosis present

## 2015-05-24 DIAGNOSIS — I739 Peripheral vascular disease, unspecified: Secondary | ICD-10-CM | POA: Diagnosis present

## 2015-05-24 HISTORY — DX: Abdominal aortic aneurysm, without rupture: I71.4

## 2015-05-24 HISTORY — DX: Abdominal aortic aneurysm, without rupture, unspecified: I71.40

## 2015-05-24 LAB — URINALYSIS, ROUTINE W REFLEX MICROSCOPIC
Bilirubin Urine: NEGATIVE
Glucose, UA: NEGATIVE mg/dL
Hgb urine dipstick: NEGATIVE
Ketones, ur: NEGATIVE mg/dL
Leukocytes, UA: NEGATIVE
Nitrite: NEGATIVE
Protein, ur: NEGATIVE mg/dL
Specific Gravity, Urine: 1.015 (ref 1.005–1.030)
pH: 6 (ref 5.0–8.0)

## 2015-05-24 LAB — COMPREHENSIVE METABOLIC PANEL
ALT: 12 U/L — ABNORMAL LOW (ref 17–63)
AST: 20 U/L (ref 15–41)
Albumin: 3.5 g/dL (ref 3.5–5.0)
Alkaline Phosphatase: 85 U/L (ref 38–126)
Anion gap: 8 (ref 5–15)
BUN: 10 mg/dL (ref 6–20)
CO2: 25 mmol/L (ref 22–32)
Calcium: 9.7 mg/dL (ref 8.9–10.3)
Chloride: 103 mmol/L (ref 101–111)
Creatinine, Ser: 1.38 mg/dL — ABNORMAL HIGH (ref 0.61–1.24)
GFR calc Af Amer: >60 mL/min (ref >60)
GFR calc non Af Amer: 53 mL/min — ABNORMAL LOW (ref >60)
Glucose, Bld: 132 mg/dL — ABNORMAL HIGH (ref 65–99)
Potassium: 4 mmol/L (ref 3.5–5.1)
Sodium: 136 mmol/L (ref 135–145)
Total Bilirubin: 1.1 mg/dL (ref 0.3–1.2)
Total Protein: 7 g/dL (ref 6.5–8.1)

## 2015-05-24 LAB — CBC
HCT: 41.8 % (ref 39.0–52.0)
Hemoglobin: 14.5 g/dL (ref 13.0–17.0)
MCH: 29.5 pg (ref 26.0–34.0)
MCHC: 34.7 g/dL (ref 30.0–36.0)
MCV: 85.1 fL (ref 78.0–100.0)
Platelets: 147 10*3/uL — ABNORMAL LOW (ref 150–400)
RBC: 4.91 MIL/uL (ref 4.22–5.81)
RDW: 14.1 % (ref 11.5–15.5)
WBC: 13.3 10*3/uL — ABNORMAL HIGH (ref 4.0–10.5)

## 2015-05-24 LAB — PROTIME-INR
INR: 1.15 (ref 0.00–1.49)
Prothrombin Time: 14.9 seconds (ref 11.6–15.2)

## 2015-05-24 LAB — HEPARIN LEVEL (UNFRACTIONATED)
Heparin Unfractionated: <0.1 IU/mL — ABNORMAL LOW (ref 0.30–0.70)
Heparin Unfractionated: 0.15 IU/mL — ABNORMAL LOW (ref 0.30–0.70)

## 2015-05-24 LAB — LIPASE, BLOOD: Lipase: 48 U/L (ref 11–51)

## 2015-05-24 MED ORDER — ONDANSETRON HCL 4 MG/2ML IJ SOLN: 4.0000 mg | Freq: Three times a day (TID) | INTRAMUSCULAR | Status: AC | PRN

## 2015-05-24 MED ORDER — ATORVASTATIN CALCIUM 20 MG PO TABS
20.0000 mg | ORAL_TABLET | Freq: Every day | ORAL | Status: DC
  Administered 2015-05-24 – 2015-05-28 (×5): 20 mg via ORAL
  Filled 2015-05-24 (×5): qty 1

## 2015-05-24 MED ORDER — DIPHENHYDRAMINE HCL 25 MG PO CAPS
25.0000 mg | ORAL_CAPSULE | Freq: Every day | ORAL | Status: DC | PRN
  Administered 2015-05-25: 25 mg via ORAL
  Filled 2015-05-24: qty 1

## 2015-05-24 MED ORDER — AMLODIPINE BESYLATE 10 MG PO TABS
10.0000 mg | ORAL_TABLET | Freq: Every day | ORAL | Status: DC
  Administered 2015-05-24 – 2015-05-29 (×6): 10 mg via ORAL
  Filled 2015-05-24: qty 2
  Filled 2015-05-24 (×2): qty 1
  Filled 2015-05-24: qty 2
  Filled 2015-05-24: qty 1
  Filled 2015-05-24: qty 2

## 2015-05-24 MED ORDER — MORPHINE SULFATE (PF) 4 MG/ML IV SOLN
4.0000 mg | INTRAVENOUS | Status: AC | PRN
  Administered 2015-05-24 (×2): 4 mg via INTRAVENOUS
  Filled 2015-05-24 (×2): qty 1

## 2015-05-24 MED ORDER — SODIUM CHLORIDE 0.9 % IJ SOLN: 3.0000 mL | INTRAMUSCULAR | Status: DC | PRN

## 2015-05-24 MED ORDER — HEPARIN BOLUS VIA INFUSION
4000.0000 [IU] | Freq: Once | INTRAVENOUS | Status: AC
  Administered 2015-05-24: 4000 [IU] via INTRAVENOUS

## 2015-05-24 MED ORDER — SODIUM CHLORIDE 0.9 % IV SOLN: 250.0000 mL | INTRAVENOUS | Status: DC | PRN

## 2015-05-24 MED ORDER — HYDROMORPHONE HCL 1 MG/ML IJ SOLN
0.5000 mg | INTRAMUSCULAR | Status: AC | PRN
  Administered 2015-05-24 (×3): 0.5 mg via INTRAVENOUS
  Filled 2015-05-24 (×3): qty 1

## 2015-05-24 MED ORDER — WARFARIN - PHARMACIST DOSING INPATIENT: Status: DC

## 2015-05-24 MED ORDER — SODIUM CHLORIDE 0.9 % IV SOLN
INTRAVENOUS | Status: DC
  Administered 2015-05-24: 1000 mL via INTRAVENOUS
  Administered 2015-05-24: 20:00:00 via INTRAVENOUS
  Administered 2015-05-24: 1000 mL via INTRAVENOUS
  Administered 2015-05-25 – 2015-05-27 (×6): via INTRAVENOUS

## 2015-05-24 MED ORDER — WARFARIN SODIUM 5 MG PO TABS
10.0000 mg | ORAL_TABLET | Freq: Once | ORAL | Status: AC
  Administered 2015-05-24: 10 mg via ORAL
  Filled 2015-05-24: qty 2

## 2015-05-24 MED ORDER — ONDANSETRON HCL 4 MG/2ML IJ SOLN
4.0000 mg | INTRAMUSCULAR | Status: DC | PRN
  Administered 2015-05-24: 4 mg via INTRAVENOUS
  Filled 2015-05-24: qty 2

## 2015-05-24 MED ORDER — IOHEXOL 300 MG/ML  SOLN
100.0000 mL | Freq: Once | INTRAMUSCULAR | Status: DC | PRN
Start: 2015-05-24 — End: 2015-05-29

## 2015-05-24 MED ORDER — LISINOPRIL 40 MG PO TABS
40.0000 mg | ORAL_TABLET | Freq: Every day | ORAL | Status: DC
  Administered 2015-05-24 – 2015-05-27 (×4): 40 mg via ORAL
  Filled 2015-05-24 (×4): qty 4
  Filled 2015-05-24: qty 1

## 2015-05-24 MED ORDER — ACETAMINOPHEN 325 MG PO TABS
650.0000 mg | ORAL_TABLET | Freq: Four times a day (QID) | ORAL | Status: DC | PRN
  Administered 2015-05-25: 650 mg via ORAL
  Filled 2015-05-24: qty 2

## 2015-05-24 MED ORDER — SODIUM CHLORIDE 0.9 % IJ SOLN
3.0000 mL | Freq: Two times a day (BID) | INTRAMUSCULAR | Status: DC
  Administered 2015-05-24 – 2015-05-26 (×3): 3 mL via INTRAVENOUS

## 2015-05-24 MED ORDER — HEPARIN BOLUS VIA INFUSION
2000.0000 [IU] | Freq: Once | INTRAVENOUS | Status: AC
  Administered 2015-05-24: 2000 [IU] via INTRAVENOUS
  Filled 2015-05-24: qty 2000

## 2015-05-24 MED ORDER — DIATRIZOATE MEGLUMINE & SODIUM 66-10 % PO SOLN
ORAL | Status: AC
  Filled 2015-05-24: qty 30

## 2015-05-24 MED ORDER — SODIUM CHLORIDE 0.9 % IV SOLN: INTRAVENOUS | Status: AC

## 2015-05-24 MED ORDER — ALBUTEROL SULFATE (2.5 MG/3ML) 0.083% IN NEBU: 2.5000 mg | INHALATION_SOLUTION | RESPIRATORY_TRACT | Status: DC | PRN

## 2015-05-24 MED ORDER — ACETAMINOPHEN 650 MG RE SUPP: 650.0000 mg | Freq: Four times a day (QID) | RECTAL | Status: DC | PRN

## 2015-05-24 MED ORDER — HEPARIN (PORCINE) IN NACL 100-0.45 UNIT/ML-% IJ SOLN
2050.0000 [IU]/h | INTRAMUSCULAR | Status: DC
  Administered 2015-05-24: 1350 [IU]/h via INTRAVENOUS
  Administered 2015-05-24: 1650 [IU]/h via INTRAVENOUS
  Administered 2015-05-25: 1900 [IU]/h via INTRAVENOUS
  Administered 2015-05-25 – 2015-05-26 (×2): 2050 [IU]/h via INTRAVENOUS
  Filled 2015-05-24 (×5): qty 250

## 2015-05-24 MED ORDER — WARFARIN SODIUM 5 MG PO TABS: 5.0000 mg | ORAL_TABLET | Freq: Every day | ORAL | Status: DC

## 2015-05-24 NOTE — ED Notes (Signed)
Pt states dr Laural Golden did a colonoscopy on Thursday, started having pain later that day and has continued since then. Dry heaves.

## 2015-05-24 NOTE — Progress Notes (Signed)
ANTICOAGULATION CONSULT NOTE - Initial Consult  Pharmacy Consult for Heparin Indication: Possible renal infarct, history of AFIB  No Known Allergies  Patient Measurements: Height: 6' (182.9 cm) Weight: 189 lb (85.73 kg) IBW/kg (Calculated) : 77.6 Heparin Dosing Weight: 85.7 kg  Vital Signs: Temp: 97.9 F (36.6 C) (01/21 0035) Temp Source: Oral (01/21 0035) BP: 165/98 mmHg (01/21 [id number]) Pulse Rate: 98 (01/21 0638)  Labs:  Recent Labs  05/24/15 0112  HGB 14.5  HCT 41.8  PLT 147*  LABPROT 14.9  INR 1.15  CREATININE 1.38*    Estimated Creatinine Clearance: 60.9 mL/min (by C-G formula based on Cr of 1.38).   Medical History: Past Medical History  Diagnosis Date  . Arteriosclerotic cardiovascular disease (ASCVD)     AMI in 2000 treated at [facility]; cath in 12/2006->  Chronic total obstruction of the RCA;  drug-eluting stent placed in M1; inferior hypokinesis with an EF of 45%  . Tobacco abuse, in remission     20 pack years; quit in 2009  . Hypertension   . Chronic anticoagulation   . Atrial fibrillation ([place])     on coumadin for couple of years, stopped plavix at that time  . PVD (peripheral vascular disease) ([place])     Ct angiogram in 2009 revealed stable disease with 80% celiac stenosis,50% right renal artery ,ASCVD with ulceration in the abdominal aortashe  . Nephrolithiasis   . Cerebrovascular disease 2002    carotid stent  . Hyperlipidemia   . Myocardial infarction ([place]) 10 yrs ago  . Dysphagia   . Testicular carcinoma ([place]) 1990    right orchiectomy    Medications:   (Not in a hospital admission)  Assessment: 65 yo ED patient, PTA on Warfarin for history of AFIB Heparin for possible renal infarct on CT scan INR sub therapeutic this AM Labs reviewed  Goal of Therapy:  Heparin level 0.3-0.7 units/ml Monitor platelets by anticoagulation protocol: Yes   Plan:  Give 4000 units bolus x 1 Start heparin infusion at 1350 units/hr Check anti-Xa level in  6 hours and daily while on heparin Continue to monitor H&H and platelets  [name] [name], [name] [name] 05/24/2015,7:54 AM

## 2015-05-24 NOTE — ED Notes (Signed)
Attempted report x 3.  

## 2015-05-24 NOTE — Progress Notes (Signed)
ANTICOAGULATION CONSULT NOTE -  Pharmacy Consult for Heparin Indication: Possible renal infarct, history of AFIB  No Known Allergies  Patient Measurements: Height: 6' (182.9 cm) Weight: 189 lb (85.73 kg) IBW/kg (Calculated) : 77.6 Heparin Dosing Weight: 85.7 kg  Vital Signs: Temp: 97.9 F (36.6 C) (01/21 1400) Temp Source: Oral (01/21 1400) BP: 147/83 mmHg (01/21 1400) Pulse Rate: 97 (01/21 1400)  Labs:  Recent Labs  05/24/15 0112 05/24/15 1357  HGB 14.5  --   HCT 41.8  --   PLT 147*  --   LABPROT 14.9  --   INR 1.15  --   HEPARINUNFRC  --  <0.10*  CREATININE 1.38*  --     Estimated Creatinine Clearance: 60.9 mL/min (by C-G formula based on Cr of 1.38).   Medical History: Past Medical History  Diagnosis Date  . Arteriosclerotic cardiovascular disease (ASCVD)     AMI in 2000 treated at [facility]; cath in 12/2006->  Chronic total obstruction of the RCA;  drug-eluting stent placed in M1; inferior hypokinesis with an EF of 45%  . Tobacco abuse, in remission     20 pack years; quit in 2009  . Hypertension   . Chronic anticoagulation   . Atrial fibrillation ([place])     on coumadin for couple of years, stopped plavix at that time  . PVD (peripheral vascular disease) ([place])     Ct angiogram in 2009 revealed stable disease with 80% celiac stenosis,50% right renal artery ,ASCVD with ulceration in the abdominal aortashe  . Nephrolithiasis   . Cerebrovascular disease 2002    carotid stent  . Hyperlipidemia   . Myocardial infarction ([place]) 10 yrs ago  . Dysphagia   . Testicular carcinoma ([place]) 1990    right orchiectomy    Medications:  Prescriptions prior to admission  Medication Sig Dispense Refill Last Dose  . albuterol (PROVENTIL HFA;VENTOLIN HFA) 108 (90 BASE) MCG/ACT inhaler Inhale 2-4 puffs into the lungs every 4 (four) hours as needed for wheezing or shortness of breath. 1 Inhaler 0 unknown  . amLODipine (NORVASC) 10 MG tablet take 1 tablet by mouth once daily 90  tablet 3 Past Week at Unknown time  . atorvastatin (LIPITOR) 20 MG tablet Take 1 tablet (20 mg total) by mouth at bedtime. 30 tablet 3 05/23/2015 at Unknown time  . cloNIDine (CATAPRES) 0.1 MG tablet Take 1 tablet (0.1 mg total) by mouth 2 (two) times daily. 180 tablet 3 05/23/2015 at Unknown time  . diphenhydrAMINE (BENADRYL) 25 MG tablet Take 25 mg by mouth daily as needed for allergies.   unknown  . fexofenadine (ALLEGRA) 180 MG tablet Take 1 tablet (180 mg total) by mouth daily. 30 tablet 2 unknown  . lisinopril (PRINIVIL,ZESTRIL) 40 MG tablet Take 1 tablet (40 mg total) by mouth daily. 90 tablet 3 05/23/2015 at Unknown time  . Multiple Vitamin (MULITIVITAMIN WITH MINERALS) TABS Take 1 tablet by mouth daily.   05/23/2015 at Unknown time  . Multiple Vitamin (ONE-A-DAY MENS PO) Take 1 tablet by mouth daily.   05/23/2015 at Unknown time  . nitroGLYCERIN (NITROSTAT) 0.4 MG SL tablet place 1 tablet under the tongue if needed every 5 minutes for chest pain for 3 doses IF NO RELIEF AFTER 3RD DOSE CALL PRESCRIBER OR 911. 25 tablet 2 unknown  . Omega-3 Fatty Acids (FISH OIL) 1000 MG CAPS Take 1,000 mg by mouth daily.   0 05/23/2015 at Unknown time  . pantoprazole (PROTONIX) 40 MG tablet Take 40 mg by mouth daily.  05/23/2015 at Unknown time  . traZODone (DESYREL) 50 MG tablet Take 0.5-1 tablets (25-50 mg total) by mouth at bedtime as needed for sleep. 30 tablet 3 unknown at Unknown time  . warfarin (COUMADIN) 5 MG tablet Take 1 1/2 tablets daily (Patient taking differently: Take 5-7.5 mg by mouth daily. Take one tablet on all days except on Wednesdays and Thursdays take one and one-half tablets) 45 tablet 6 Past Month at Unknown time  . polyethylene glycol-electrolytes (NULYTELY/GOLYTELY) 420 g solution Take 4,000 mLs by mouth once. 4000 mL 0 05/22/2015 at 0830    Assessment: 65 yo ED patient, PTA on Warfarin for history of AFIB Heparin for possible renal infarct on CT scan INR sub therapeutic this AM Labs  reviewed Initial heparin level below goal  Goal of Therapy:  Heparin level 0.3-0.7 units/ml Monitor platelets by anticoagulation protocol: Yes   Plan:  Heparin 2000 unit IV bolus, then increase heparin to 1650 units/hour Unfractionated heparin level in 6 hours and daily   [place], Joandry Slagter [employer] 05/24/2015,4:28 PM

## 2015-05-24 NOTE — H&P (Signed)
History and Physical  SAYF LANZER [id number] DOB: 1950/05/22 DOA: 05/24/2015  Referring physician: ED  PCP: Vic Blackbird, MD   Chief Complaint: abdominal pain  HPI:  65 year old male presented to ED on 05/23/15 for abdominal pain.  Patient has a recent history of colonoscopy on 05/22/15 which he states was a 3 year follow up for colonic polyps.  He was taken off Coumadin, which he is on chronically for atrial fibrillation, for the colonoscopy.  He mentions he has been on coumadin for 4 years for stents placed in his heart.  He voiced that his colonoscopy went without problem and he did eat a sandwich after the procedure and shortly after eating he developed significant abdominal pain.  He has not eaten since that time due to pain.  He has taken his medications though.  He describes the pain as sharp located just beneath his ribs on his right side as well as in the groin region on his right side.  The pain does not radiate but he does mention his entire lower abdomen feels sore from the pain in his right groin.  He denies any emesis.  Nothing at home made the pain any better and all movement and palpation makes it worse. Denies fevers at home, denies hematochezia and hematuria.  Denies any shortness of breath and chest pain.   In the emergency department patient underwent baseline laboratory workup and CT abd/ pelvis (results below) Pertinent labs: listed below EKG: Independently reviewed.  Imaging: independently reviewed.   Review of Systems:  As listed in HPI. Negative for fever, visual changes, sore throat, rash, new muscle aches, chest pain, SOB, dysuria, bleeding.  Past Medical History  Diagnosis Date  . Arteriosclerotic cardiovascular disease (ASCVD)     AMI in 2000 treated at [facility]; cath in 12/2006->  Chronic total obstruction of the RCA;  drug-eluting stent placed in M1; inferior hypokinesis with an EF of 45%  . Tobacco abuse, in remission     20 pack years; quit in 2009    . Hypertension   . Chronic anticoagulation   . Atrial fibrillation ([place])     on coumadin for couple of years, stopped plavix at that time  . PVD (peripheral vascular disease) ([place])     Ct angiogram in 2009 revealed stable disease with 80% celiac stenosis,50% right renal artery ,ASCVD with ulceration in the abdominal aortashe  . Nephrolithiasis   . Cerebrovascular disease 2002    carotid stent  . Hyperlipidemia   . Myocardial infarction ([place]) 10 yrs ago  . Dysphagia   . Testicular carcinoma [facility]) 1990    right orchiectomy    Past Surgical History  Procedure Laterality Date  . Appendectomy  2004  . Testicular cancer  1990    right orchiectomy  . Colonoscopy  06/17/2011    INCOMPLETE, PREP POOR. Procedure: COLONOSCOPY;  Surgeon: Daneil Dolin, MD;  Location: [facility] ENDO SUITE;  Service: Endoscopy;  Laterality: N/A;  10:00  . Esophagogastroduodenoscopy  06/17/2011    severe erosive/ulcerative reflux esophagitis, soft noncritical stricture dilatied, small hh, antral erosion   . Colonoscopy  07/15/2011    [id number] rectal and colon polyps    Social History:  reports that he quit smoking about 6 years ago. His smoking use included Cigarettes. He has a 20 pack-year smoking history. He has never used smokeless tobacco. He reports that he does not drink alcohol or use illicit drugs. lives with their spouse Self-care  No Known Allergies  Family  History  Problem Relation Age of Onset  . Colon cancer Father 42    deceased  . Prostate cancer Father   . Liver disease Neg Hx   . Anesthesia problems Neg Hx   . Hypotension Neg Hx   . Malignant hyperthermia Neg Hx   . Pseudochol deficiency Neg Hx      Prior to Admission medications   Medication Sig Start Date End Date Taking? Authorizing Provider  albuterol (PROVENTIL HFA;VENTOLIN HFA) 108 (90 BASE) MCG/ACT inhaler Inhale 2-4 puffs into the lungs every 4 (four) hours as needed for wheezing or shortness of breath. 04/10/15  Yes Kristen N  Ward, DO  amLODipine (NORVASC) 10 MG tablet take 1 tablet by mouth once daily 07/16/14  Yes Alycia Rossetti, MD  atorvastatin (LIPITOR) 20 MG tablet Take 1 tablet (20 mg total) by mouth at bedtime. 07/22/14  Yes Alycia Rossetti, MD  cloNIDine (CATAPRES) 0.1 MG tablet Take 1 tablet (0.1 mg total) by mouth 2 (two) times daily. 11/07/14 11/07/15 Yes Lendon Colonel, NP  diphenhydrAMINE (BENADRYL) 25 MG tablet Take 25 mg by mouth daily as needed for allergies.   Yes Historical Provider, MD  fexofenadine (ALLEGRA) 180 MG tablet Take 1 tablet (180 mg total) by mouth daily. 07/01/11  Yes Alycia Rossetti, MD  lisinopril (PRINIVIL,ZESTRIL) 40 MG tablet Take 1 tablet (40 mg total) by mouth daily. 11/07/14  Yes Lendon Colonel, NP  Multiple Vitamin (MULITIVITAMIN WITH MINERALS) TABS Take 1 tablet by mouth daily.   Yes Historical Provider, MD  Multiple Vitamin (ONE-A-DAY MENS PO) Take 1 tablet by mouth daily.   Yes Historical Provider, MD  nitroGLYCERIN (NITROSTAT) 0.4 MG SL tablet place 1 tablet under the tongue if needed every 5 minutes for chest pain for 3 doses IF NO RELIEF AFTER 3RD DOSE CALL PRESCRIBER OR 911. 12/16/14  Yes Lendon Colonel, NP  Omega-3 Fatty Acids (FISH OIL) 1000 MG CAPS Take 1,000 mg by mouth daily.  11/12/11  Yes Yehuda Savannah, MD  pantoprazole (PROTONIX) 40 MG tablet Take 40 mg by mouth daily.   Yes Historical Provider, MD  traZODone (DESYREL) 50 MG tablet Take 0.5-1 tablets (25-50 mg total) by mouth at bedtime as needed for sleep. 07/16/14  Yes Alycia Rossetti, MD  warfarin (COUMADIN) 5 MG tablet Take 1 1/2 tablets daily Patient taking differently: Take 5-7.5 mg by mouth daily. Take one tablet on all days except on Wednesdays and Thursdays take one and one-half tablets 11/07/14  Yes Lendon Colonel, NP  polyethylene glycol-electrolytes (NULYTELY/GOLYTELY) 420 g solution Take 4,000 mLs by mouth once. 05/14/15   Mahala Menghini, PA-C   Physical Exam: Filed Vitals:   05/24/15 0329  05/24/15 [id number] 05/24/15 0830 05/24/15 0930  BP: 166/93 165/98 160/84 177/92  Pulse: 91 98 98 101  Temp:      TempSrc:      Resp: 20 18    Height:      Weight:      SpO2: 95% 91%  92%    Physical Exam General:  Appears calm and comfortable Eyes: PERRL, normal lids, irises & conjunctiva ENT: grossly normal hearing, lips & tongue Neck: no LAD, masses or thyromegaly Cardiovascular: RRR, no m/r/g. No LE edema. Telemetry: rate controlled atrial fibrillation  Respiratory: CTA bilaterally, no w/r/r. Normal respiratory effort. Abdomen: tender in right upper quadrant, flank and CVA.  Nontender in LUQ, LLQ and RLQ.  Bowel sounds appreciated in all quadrants.  No hernias appreciated Skin:  no rash or induration seen on limited exam Musculoskeletal: grossly normal tone BUE/BLE Psychiatric: grossly normal mood and affect, speech fluent and appropriate Neurologic: grossly non-focal.  Wt Readings from Last 3 Encounters:  05/24/15 85.73 kg (189 lb)  05/14/15 89.177 kg (196 lb 9.6 oz)  04/22/15 91.173 kg (201 lb)    Labs on Admission:  Basic Metabolic Panel:  Recent Labs Lab 05/24/15 0112  NA 136  K 4.0  CL 103  CO2 25  GLUCOSE 132*  BUN 10  CREATININE 1.38*  CALCIUM 9.7    Liver Function Tests:  Recent Labs Lab 05/24/15 0112  AST 20  ALT 12*  ALKPHOS 85  BILITOT 1.1  PROT 7.0  ALBUMIN 3.5    Recent Labs Lab 05/24/15 0112  LIPASE 48   No results for input(s): AMMONIA in the last 168 hours.  CBC:  Recent Labs Lab 05/24/15 0112  WBC 13.3*  HGB 14.5  HCT 41.8  MCV 85.1  PLT 147*    Cardiac Enzymes: No results for input(s): CKTOTAL, CKMB, CKMBINDEX, TROPONINI in the last 168 hours.  Troponin (Point of Care Test) No results for input(s): TROPIPOC in the last 72 hours.  BNP (last 3 results) No results for input(s): PROBNP in the last 8760 hours.  CBG: No results for input(s): GLUCAP in the last 168 hours.   Radiological Exams on Admission: Dg Chest 2  View  05/24/2015  CLINICAL DATA:  65 year old male with nausea and vomiting and sharp right lower quadrant abdominal pain EXAM: CHEST  2 VIEW COMPARISON:  Radiograph dated 04/09/2015 FINDINGS: The heart size and mediastinal contours are within normal limits. Both lungs are clear. The visualized skeletal structures are unremarkable. IMPRESSION: No active cardiopulmonary disease. Electronically Signed   By: Anner Crete M.D.   On: 05/24/2015 04:45   Ct Abdomen Pelvis W Contrast  05/24/2015  CLINICAL DATA:  65 year old male with abdominal pain. Status post colonoscopy. EXAM: CT ABDOMEN AND PELVIS WITH CONTRAST TECHNIQUE: Multidetector CT imaging of the abdomen and pelvis was performed using the standard protocol following bolus administration of intravenous contrast. CONTRAST:  100 cc Omnipaque 300 COMPARISON:  CT dated 12/19/2007 FINDINGS: The visualized lung bases are clear. No intra-abdominal free air or free fluid. The liver, gallbladder, pancreas, spleen, right adrenal gland appear unremarkable. A 1.3 cm indeterminate left adrenal nodule, likely an adenoma. There is moderate right renal atrophy, increased from prior study. There is asymmetric hypo enhancement of the right renal parenchyma compatible with hypoperfusion likely related to vascular compromise. Ultrasound with duplex interrogation of the right renal artery or CT angiography is recommended for better evaluation. There is no hydronephrosis on the right. A 4 mm nonobstructing left renal upper pole calculus. No hydronephrosis. The visualized ureters and urinary bladder appear unremarkable. The prostate and seminal vesicles are grossly unremarkable. Small hiatal hernia with gastroesophageal reflux. Oral contrast opacifies the stomach and loops of small bowel. There is no evidence of bowel obstruction or inflammation. Appendectomy. Aortoiliac atherosclerotic disease. There is a nonenhancing 3.5 x 3.6 cm (previously 2.5 x 2.7 cm) the structure to the  left of the aorta at the level of the left renal artery. There is thin calcification of the wall of this structure. 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 Other etiologies are less likely. Follow-up recommended. There is a 3.3 cm infrarenal abdominal aortic aneurysm (previously 3.0 cm). No portal venous gas identified. There is no adenopathy. The abdominal wall soft tissues appear unremarkable. There is degenerative changes of the  spine. No acute fracture. IMPRESSION: Move no pneumoperitoneum or evidence of traumatic bowel injury. Moderate right renal atrophy with hypoperfusion of the right kidney concerning for vascular compromise. Further evaluation of the right kidney as well as interrogation of the right renal artery with duplex ultrasound or CT angiography recommended. Nonobstructing 4 mm left renal calculus. 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 Follow-up recommended. A 3.3 cm infrarenal abdominal aortic aneurysm. Recommend followup by ultrasound in 3 years. This recommendation follows ACR consensus guidelines: White Paper of the ACR Incidental Findings Committee II on Vascular Findings. J Am Coll Radiol [id number] Electronically Signed   By: Anner Crete M.D.   On: 05/24/2015 04:41      Active Problems:   Abdominal pain   Assessment/Plan 1. Abdominal Pain: hypoperfusion noted on CT scan and it is possible with patient recently being off coumadin for him to have developed an embolus that could have traveled to the kidney.  Vascular surgeon did review films and suggested follow up in 2 days with CT angiogram of the kidneys but did not suggest surgical intervention at this time.  Patient pain controlled relatively well with morphine in the ED.  Will continue morphine for pain control but add bowel prep to ensure patient does not become constipated.  Will bridge to Coumadin with heparin 2. Atrial fibrillation: continue  home dosage of warfarin, currently rate controlled 3. HTN: continue amlodipine and lisinopril 4. GI: regular diet 5. VTE: on heparin and warfarin per pharmacy dosing  Code Status: full  DVT prophylaxis: see above Family Communication: no family at bedside Disposition Plan/Anticipated LOS: will stay 2-3 days until fully bridged to coumadin with therapeutic INR  Time spent: 40 minutes  Coralie Common, MD  [facility]  05/24/2015, 10:25 AM

## 2015-05-24 NOTE — ED Notes (Signed)
Attempted report x 2 

## 2015-05-24 NOTE — Progress Notes (Signed)
ANTICOAGULATION CONSULT NOTE - Initial Consult  Pharmacy Consult for Warfarin Indication: atrial fibrillation  No Known Allergies  Patient Measurements: Height: 6' (182.9 cm) Weight: 189 lb (85.73 kg) IBW/kg (Calculated) : 77.6 Heparin Dosing Weight:   Vital Signs: Temp: 97.9 F (36.6 C) (01/21 0035) Temp Source: Oral (01/21 0035) BP: 177/92 mmHg (01/21 0930) Pulse Rate: 101 (01/21 0930)  Labs:  Recent Labs  05/24/15 0112  HGB 14.5  HCT 41.8  PLT 147*  LABPROT 14.9  INR 1.15  CREATININE 1.38*    Estimated Creatinine Clearance: 60.9 mL/min (by C-G formula based on Cr of 1.38).   Medical History: Past Medical History  Diagnosis Date  . Arteriosclerotic cardiovascular disease (ASCVD)     AMI in 2000 treated at [facility]; cath in 12/2006->  Chronic total obstruction of the RCA;  drug-eluting stent placed in M1; inferior hypokinesis with an EF of 45%  . Tobacco abuse, in remission     20 pack years; quit in 2009  . Hypertension   . Chronic anticoagulation   . Atrial fibrillation ([place])     on coumadin for couple of years, stopped plavix at that time  . PVD (peripheral vascular disease) ([place])     Ct angiogram in 2009 revealed stable disease with 80% celiac stenosis,50% right renal artery ,ASCVD with ulceration in the abdominal aortashe  . Nephrolithiasis   . Cerebrovascular disease 2002    carotid stent  . Hyperlipidemia   . Myocardial infarction ([place]) 10 yrs ago  . Dysphagia   . Testicular carcinoma ([place]) 1990    right orchiectomy    Medications:  Prescriptions prior to admission  Medication Sig Dispense Refill Last Dose  . albuterol (PROVENTIL HFA;VENTOLIN HFA) 108 (90 BASE) MCG/ACT inhaler Inhale 2-4 puffs into the lungs every 4 (four) hours as needed for wheezing or shortness of breath. 1 Inhaler 0 unknown  . amLODipine (NORVASC) 10 MG tablet take 1 tablet by mouth once daily 90 tablet 3 Past Week at Unknown time  . atorvastatin (LIPITOR) 20 MG tablet Take 1  tablet (20 mg total) by mouth at bedtime. 30 tablet 3 05/23/2015 at Unknown time  . cloNIDine (CATAPRES) 0.1 MG tablet Take 1 tablet (0.1 mg total) by mouth 2 (two) times daily. 180 tablet 3 05/23/2015 at Unknown time  . diphenhydrAMINE (BENADRYL) 25 MG tablet Take 25 mg by mouth daily as needed for allergies.   unknown  . fexofenadine (ALLEGRA) 180 MG tablet Take 1 tablet (180 mg total) by mouth daily. 30 tablet 2 unknown  . lisinopril (PRINIVIL,ZESTRIL) 40 MG tablet Take 1 tablet (40 mg total) by mouth daily. 90 tablet 3 05/23/2015 at Unknown time  . Multiple Vitamin (MULITIVITAMIN WITH MINERALS) TABS Take 1 tablet by mouth daily.   05/23/2015 at Unknown time  . Multiple Vitamin (ONE-A-DAY MENS PO) Take 1 tablet by mouth daily.   05/23/2015 at Unknown time  . nitroGLYCERIN (NITROSTAT) 0.4 MG SL tablet place 1 tablet under the tongue if needed every 5 minutes for chest pain for 3 doses IF NO RELIEF AFTER 3RD DOSE CALL PRESCRIBER OR 911. 25 tablet 2 unknown  . Omega-3 Fatty Acids (FISH OIL) 1000 MG CAPS Take 1,000 mg by mouth daily.   0 05/23/2015 at Unknown time  . pantoprazole (PROTONIX) 40 MG tablet Take 40 mg by mouth daily.   05/23/2015 at Unknown time  . traZODone (DESYREL) 50 MG tablet Take 0.5-1 tablets (25-50 mg total) by mouth at bedtime as needed for sleep. 30 tablet  3 unknown at Unknown time  . warfarin (COUMADIN) 5 MG tablet Take 1 1/2 tablets daily (Patient taking differently: Take 5-7.5 mg by mouth daily. Take one tablet on all days except on Wednesdays and Thursdays take one and one-half tablets) 45 tablet 6 Past Month at Unknown time  . polyethylene glycol-electrolytes (NULYTELY/GOLYTELY) 420 g solution Take 4,000 mLs by mouth once. 4000 mL 0 05/22/2015 at 0830    Assessment: Admitted from ED Abdominal Pain: hypoperfusion noted on CT scan and possible with patient recently being off coumadin for colonoscopy to have developed an embolus that could have traveled to the kidney. Vascular  surgeon did review films and suggested follow up in 2 days with CT angiogram of the kidneys but did not suggest surgical intervention at this time Heparin started in ED, bridge with Coumadin. INR 1.15 on admission. Last dose indicated 05/18/2015.  Goal of Therapy:  INR 2-3 Monitor platelets by anticoagulation protocol: Yes   Plan:  Coumadin 10 mg po x 1 dose tonight (increased from home dose due to no doses since 05/18/2015) INR/PT daily Monitor CBC, platelets, signs of bleeding.  Abner Greenspan, [place] Rochelle Bowie 05/24/2015,12:13 PM

## 2015-05-24 NOTE — ED Provider Notes (Signed)
CSN: [id number]     Arrival date & time 05/23/15  2352 History   First MD Initiated Contact with Patient 05/24/15 [phone]     Chief Complaint  Patient presents with  . Abdominal Pain      HPI Pt was seen at 0230.  Per pt, c/o gradual onset and persistence of constant generalized abd "pain" since this morning. States the pain radiates into his right lower back. Has been associated with multiple intermittent episodes of N/V.  Describes the abd pain as "aching" and "cramping." States he had a colonoscopy yesterday. Denies diarrhea, no fevers, no rash, no CP/SOB, no black or blood in stools or emesis.      Past Medical History  Diagnosis Date  . Arteriosclerotic cardiovascular disease (ASCVD)     AMI in 2000 treated at [facility]; cath in 12/2006->  Chronic total obstruction of the RCA;  drug-eluting stent placed in M1; inferior hypokinesis with an EF of 45%  . Tobacco abuse, in remission     20 pack years; quit in 2009  . Hypertension   . Chronic anticoagulation   . Atrial fibrillation ([place])     on coumadin for couple of years, stopped plavix at that time  . PVD (peripheral vascular disease) ([place])     Ct angiogram in 2009 revealed stable disease with 80% celiac stenosis,50% right renal artery ,ASCVD with ulceration in the abdominal aortashe  . Nephrolithiasis   . Cerebrovascular disease 2002    carotid stent  . Hyperlipidemia   . Myocardial infarction ([place]) 10 yrs ago  . Dysphagia   . Testicular carcinoma [facility]) 1990    right orchiectomy   Past Surgical History  Procedure Laterality Date  . Appendectomy  2004  . Testicular cancer  1990    right orchiectomy  . Colonoscopy  06/17/2011    INCOMPLETE, PREP POOR. Procedure: COLONOSCOPY;  Surgeon: Daneil Dolin, MD;  Location: [facility];  Service: Endoscopy;  Laterality: N/A;  10:00  . Esophagogastroduodenoscopy  06/17/2011    severe erosive/ulcerative reflux esophagitis, soft noncritical stricture dilatied, small hh, antral erosion   .  Colonoscopy  07/15/2011    [id number] rectal and colon polyps   Family History  Problem Relation Age of Onset  . Colon cancer Father 46    deceased  . Prostate cancer Father   . Liver disease Neg Hx   . Anesthesia problems Neg Hx   . Hypotension Neg Hx   . Malignant hyperthermia Neg Hx   . Pseudochol deficiency Neg Hx    Social History  Substance Use Topics  . Smoking status: Former Smoker -- 0.50 packs/day for 40 years    Types: Cigarettes    Quit date: 06/16/2008  . Smokeless tobacco: Never Used  . Alcohol Use: No    Review of Systems ROS: Statement: All systems negative except as marked or noted in the HPI; Constitutional: Negative for fever and chills. ; ; Eyes: Negative for eye pain, redness and discharge. ; ; ENMT: Negative for ear pain, hoarseness, nasal congestion, sinus pressure and sore throat. ; ; Cardiovascular: Negative for chest pain, palpitations, diaphoresis, dyspnea and peripheral edema. ; ; Respiratory: Negative for cough, wheezing and stridor. ; ; Gastrointestinal: +N/V, abd pain. Negative for diarrhea, blood in stool, hematemesis, jaundice and rectal bleeding. . ; ; Genitourinary: Negative for dysuria, flank pain and hematuria. ; ; Musculoskeletal: Negative for neck pain. Negative for swelling and trauma.; ; Skin: Negative for pruritus, rash, abrasions, blisters, bruising and skin lesion.; ;  Neuro: Negative for headache, lightheadedness and neck stiffness. Negative for weakness, altered level of consciousness , altered mental status, extremity weakness, paresthesias, involuntary movement, seizure and syncope.      Allergies  Review of patient's allergies indicates no known allergies.  Home Medications   Prior to Admission medications   Medication Sig Start Date End Date Taking? Authorizing Provider  pantoprazole (PROTONIX) 40 MG tablet Take 40 mg by mouth daily.   Yes Historical Provider, MD  albuterol (PROVENTIL HFA;VENTOLIN HFA) 108 (90 BASE) MCG/ACT inhaler  Inhale 2-4 puffs into the lungs every 4 (four) hours as needed for wheezing or shortness of breath. 04/10/15   Kristen N Ward, DO  amLODipine (NORVASC) 10 MG tablet take 1 tablet by mouth once daily 07/16/14   Alycia Rossetti, MD  atorvastatin (LIPITOR) 20 MG tablet Take 1 tablet (20 mg total) by mouth at bedtime. 07/22/14   Alycia Rossetti, MD  cloNIDine (CATAPRES) 0.1 MG tablet Take 1 tablet (0.1 mg total) by mouth 2 (two) times daily. 11/07/14 11/07/15  Lendon Colonel, NP  diphenhydrAMINE (BENADRYL) 25 MG tablet Take 25 mg by mouth daily as needed for allergies.    Historical Provider, MD  fexofenadine (ALLEGRA) 180 MG tablet Take 1 tablet (180 mg total) by mouth daily. 07/01/11   Alycia Rossetti, MD  lisinopril (PRINIVIL,ZESTRIL) 40 MG tablet Take 1 tablet (40 mg total) by mouth daily. 11/07/14   Lendon Colonel, NP  Multiple Vitamin (MULITIVITAMIN WITH MINERALS) TABS Take 1 tablet by mouth daily.    Historical Provider, MD  nitroGLYCERIN (NITROSTAT) 0.4 MG SL tablet place 1 tablet under the tongue if needed every 5 minutes for chest pain for 3 doses IF NO RELIEF AFTER 3RD DOSE CALL PRESCRIBER OR 911. 12/16/14   Lendon Colonel, NP  Omega-3 Fatty Acids (FISH OIL) 1000 MG CAPS Take 1,000 mg by mouth daily.  11/12/11   Yehuda Savannah, MD  polyethylene glycol-electrolytes (NULYTELY/GOLYTELY) 420 g solution Take 4,000 mLs by mouth once. 05/14/15   Mahala Menghini, PA-C  traZODone (DESYREL) 50 MG tablet Take 0.5-1 tablets (25-50 mg total) by mouth at bedtime as needed for sleep. 07/16/14   Alycia Rossetti, MD  warfarin (COUMADIN) 5 MG tablet Take 1 1/2 tablets daily Patient taking differently: Take 5-7.5 mg by mouth daily. Take one tablet on all days except on Wednesdays and Thursdays take one and one-half tablets 11/07/14   Lendon Colonel, NP   BP 206/106 mmHg  Pulse 114  Temp(Src) 97.9 F (36.6 C) (Oral)  Resp 18  Ht 6' (1.829 m)  Wt 189 lb (85.73 kg)  BMI 25.63 kg/m2  SpO2 99% Physical  Exam  0235: Physical examination:  Nursing notes reviewed; Vital signs and O2 SAT reviewed;  Constitutional: Well developed, Well nourished, Well hydrated, Uncomfortable appearing; Head:  Normocephalic, atraumatic; Eyes: EOMI, PERRL, No scleral icterus; ENMT: Mouth and pharynx normal, Mucous membranes moist; Neck: Supple, Full range of motion, No lymphadenopathy; Cardiovascular: Regular rate and rhythm, No gallop; Respiratory: Breath sounds clear & equal bilaterally, No wheezes.  Speaking full sentences with ease, Normal respiratory effort/excursion; Chest: Nontender, Movement normal; Abdomen: Soft, +diffuse tenderness to palp. No rebound or guarding. Nondistended, Normal bowel sounds; Genitourinary: No CVA tenderness; Spine:  No midline CS, TS, LS tenderness.;; Extremities: Pulses normal, No tenderness, No edema, No calf edema or asymmetry.; Neuro: AA&Ox3, Major CN grossly intact.  Speech clear. No gross focal motor or sensory deficits in extremities.; Skin: Color normal,  Warm, Dry.   ED Course  Procedures (including critical care time) Labs Review  Imaging Review  I have personally reviewed and evaluated these images and lab results as part of my medical decision-making.   EKG Interpretation None      MDM  MDM Reviewed: previous chart, nursing note and vitals Reviewed previous: labs Interpretation: labs, x-ray and CT scan      Results for orders placed or performed during the hospital encounter of 05/24/15  Lipase, blood  Result Value Ref Range   Lipase 48 11 - 51 U/L  Comprehensive metabolic panel  Result Value Ref Range   Sodium 136 135 - 145 mmol/L   Potassium 4.0 3.5 - 5.1 mmol/L   Chloride 103 101 - 111 mmol/L   CO2 25 22 - 32 mmol/L   Glucose, Bld 132 (H) 65 - 99 mg/dL   BUN 10 6 - 20 mg/dL   Creatinine, Ser 1.38 (H) 0.61 - 1.24 mg/dL   Calcium 9.7 8.9 - 10.3 mg/dL   Total Protein 7.0 6.5 - 8.1 g/dL   Albumin 3.5 3.5 - 5.0 g/dL   AST 20 15 - 41 U/L   ALT 12 (L) 17  - 63 U/L   Alkaline Phosphatase 85 38 - 126 U/L   Total Bilirubin 1.1 0.3 - 1.2 mg/dL   GFR calc non Af Amer 53 (L) >60 mL/min   GFR calc Af Amer >60 >60 mL/min   Anion gap 8 5 - 15  CBC  Result Value Ref Range   WBC 13.3 (H) 4.0 - 10.5 K/uL   RBC 4.91 4.22 - 5.81 MIL/uL   Hemoglobin 14.5 13.0 - 17.0 g/dL   HCT 41.8 39.0 - 52.0 %   MCV 85.1 78.0 - 100.0 fL   MCH 29.5 26.0 - 34.0 pg   MCHC 34.7 30.0 - 36.0 g/dL   RDW 14.1 11.5 - 15.5 %   Platelets 147 (L) 150 - 400 K/uL  Urinalysis, Routine w reflex microscopic (not at [facility])  Result Value Ref Range   Color, Urine YELLOW YELLOW   APPearance CLEAR CLEAR   Specific Gravity, Urine 1.015 1.005 - 1.030   pH 6.0 5.0 - 8.0   Glucose, UA NEGATIVE NEGATIVE mg/dL   Hgb urine dipstick NEGATIVE NEGATIVE   Bilirubin Urine NEGATIVE NEGATIVE   Ketones, ur NEGATIVE NEGATIVE mg/dL   Protein, ur NEGATIVE NEGATIVE mg/dL   Nitrite NEGATIVE NEGATIVE   Leukocytes, UA NEGATIVE NEGATIVE  Protime-INR  Result Value Ref Range   Prothrombin Time 14.9 11.6 - 15.2 seconds   INR 1.15 0.00 - 1.49   Dg Chest 2 View 05/24/2015  CLINICAL DATA:  65 year old male with nausea and vomiting and sharp right lower quadrant abdominal pain EXAM: CHEST  2 VIEW COMPARISON:  Radiograph dated 04/09/2015 FINDINGS: The heart size and mediastinal contours are within normal limits. Both lungs are clear. The visualized skeletal structures are unremarkable. IMPRESSION: No active cardiopulmonary disease. Electronically Signed   By: Anner Crete M.D.   On: 05/24/2015 04:45   Ct Abdomen Pelvis W Contrast 05/24/2015  CLINICAL DATA:  65 year old male with abdominal pain. Status post colonoscopy. EXAM: CT ABDOMEN AND PELVIS WITH CONTRAST TECHNIQUE: Multidetector CT imaging of the abdomen and pelvis was performed using the standard protocol following bolus administration of intravenous contrast. CONTRAST:  100 cc Omnipaque 300 COMPARISON:  CT dated 12/19/2007 FINDINGS: The visualized  lung bases are clear. No intra-abdominal free air or free fluid. The liver, gallbladder, pancreas, spleen, right adrenal  gland appear unremarkable. A 1.3 cm indeterminate left adrenal nodule, likely an adenoma. There is moderate right renal atrophy, increased from prior study. There is asymmetric hypo enhancement of the right renal parenchyma compatible with hypoperfusion likely related to vascular compromise. Ultrasound with duplex interrogation of the right renal artery or CT angiography is recommended for better evaluation. There is no hydronephrosis on the right. A 4 mm nonobstructing left renal upper pole calculus. No hydronephrosis. The visualized ureters and urinary bladder appear unremarkable. The prostate and seminal vesicles are grossly unremarkable. Small hiatal hernia with gastroesophageal reflux. Oral contrast opacifies the stomach and loops of small bowel. There is no evidence of bowel obstruction or inflammation. Appendectomy. Aortoiliac atherosclerotic disease. There is a nonenhancing 3.5 x 3.6 cm (previously 2.5 x 2.7 cm) the structure to the left of the aorta at the level of the left renal artery. There is thin calcification of the wall of this structure. 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 Other etiologies are less likely. Follow-up recommended. There is a 3.3 cm infrarenal abdominal aortic aneurysm (previously 3.0 cm). No portal venous gas identified. There is no adenopathy. The abdominal wall soft tissues appear unremarkable. There is degenerative changes of the spine. No acute fracture. IMPRESSION: Move no pneumoperitoneum or evidence of traumatic bowel injury. Moderate right renal atrophy with hypoperfusion of the right kidney concerning for vascular compromise. Further evaluation of the right kidney as well as interrogation of the right renal artery with duplex ultrasound or CT angiography recommended. Nonobstructing 4 mm left renal calculus. 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 Follow-up recommended. A 3.3 cm infrarenal abdominal aortic aneurysm. Recommend followup by ultrasound in 3 years. This recommendation follows ACR consensus guidelines: White Paper of the ACR Incidental Findings Committee II on Vascular Findings. J Am Coll Radiol [id number] Electronically Signed   By: Anner Crete M.D.   On: 05/24/2015 04:41   Results for Mathews, JAMEY NAAS (MRN [id number]) as of 05/24/2015 05:09  Ref. Range 06/05/2010 05:30 05/20/2011 10:19 08/23/2011 14:43 11/12/2011 14:38 12/11/2012 08:40 05/14/2013 09:05 07/16/2014 11:37 11/07/2014 08:04 04/09/2015 20:42 05/24/2015 01:12  BUN Latest Ref Range: 6-20 mg/dL 24 (H) 31 (H) 21 46 (H) 16 19 14 19 12 10   Creatinine Latest Ref Range: 0.61-1.24 mg/dL 1.45 1.70 (H) 1.56 (H) 2.01 (H) 1.23 1.42 (H) 1.13 1.10 1.21 1.38 (H)     0605:  T/C to Surgery By [facility] Vascular Surgeon Dr. Trula Slade, case discussed, including:  HPI, pertinent PM/SHx, VS/PE, dx testing, ED course and treatment: he will review CT images and c/b to discuss.  0645:  IV heparin ordered. T/C from Vascular Surgeon Dr. Trula Slade, case discussed, including:  HPI, pertinent PM/SHx, VS/PE, dx testing, ED course and treatment:  He has viewed the CT images, states known infrarenal aortic aneurysm and known left para-aortic lesion, new possibility of vascular compromise as cause for hypoperfusion of right kidney; no acute surgical issue at this time, pt can stay at [facility], start IV heparin, dose pain meds prn, rehydrate with IVF, and obtain CT-angio in 24 hours to further clarify.  T/C to [facility] Dr. Marin Comment, case discussed, including:  HPI, pertinent PM/SHx, VS/PE, dx testing, ED course and treatment, as well as d/w Vasc MD:  Agreeable to admit, requests to write temporary orders, obtain medical bed to team [facility]Admits. Dx and testing, as well as d/w Vascular MD, d/w pt and family.  Questions answered.  Verb understanding, agreeable to admit.  Francine Graven, DO 05/27/15 1347

## 2015-05-24 NOTE — Progress Notes (Signed)
ANTICOAGULATION CONSULT NOTE -  Pharmacy Consult for Heparin Indication: Possible renal infarct, history of AFIB  No Known Allergies  Patient Measurements: Height: 6' (182.9 cm) Weight: 189 lb (85.73 kg) IBW/kg (Calculated) : 77.6 Heparin Dosing Weight: 85.7 kg  Vital Signs: Temp: 98.2 F (36.8 C) (01/21 2143) Temp Source: Oral (01/21 2143) BP: 147/68 mmHg (01/21 2143) Pulse Rate: 94 (01/21 2143)  Labs:  Recent Labs  05/24/15 0112 05/24/15 1357 05/24/15 2134  HGB 14.5  --   --   HCT 41.8  --   --   PLT 147*  --   --   LABPROT 14.9  --   --   INR 1.15  --   --   HEPARINUNFRC  --  <0.10* 0.15*  CREATININE 1.38*  --   --     Estimated Creatinine Clearance: 60.9 mL/min (by C-G formula based on Cr of 1.38).   Medical History: Past Medical History  Diagnosis Date  . Arteriosclerotic cardiovascular disease (ASCVD)     AMI in 2000 treated at [facility]; cath in 12/2006->  Chronic total obstruction of the RCA;  drug-eluting stent placed in M1; inferior hypokinesis with an EF of 45%  . Tobacco abuse, in remission     20 pack years; quit in 2009  . Hypertension   . Chronic anticoagulation   . Atrial fibrillation ([place])     on coumadin for couple of years, stopped plavix at that time  . PVD (peripheral vascular disease) ([place])     Ct angiogram in 2009 revealed stable disease with 80% celiac stenosis,50% right renal artery ,ASCVD with ulceration in the abdominal aortashe  . Nephrolithiasis   . Cerebrovascular disease 2002    carotid stent  . Hyperlipidemia   . Myocardial infarction ([place]) 10 yrs ago  . Dysphagia   . Testicular carcinoma ([place]) 1990    right orchiectomy    Medications:  Prescriptions prior to admission  Medication Sig Dispense Refill Last Dose  . albuterol (PROVENTIL HFA;VENTOLIN HFA) 108 (90 BASE) MCG/ACT inhaler Inhale 2-4 puffs into the lungs every 4 (four) hours as needed for wheezing or shortness of breath. 1 Inhaler 0 unknown  . amLODipine (NORVASC)  10 MG tablet take 1 tablet by mouth once daily 90 tablet 3 Past Week at Unknown time  . atorvastatin (LIPITOR) 20 MG tablet Take 1 tablet (20 mg total) by mouth at bedtime. 30 tablet 3 05/23/2015 at Unknown time  . cloNIDine (CATAPRES) 0.1 MG tablet Take 1 tablet (0.1 mg total) by mouth 2 (two) times daily. 180 tablet 3 05/23/2015 at Unknown time  . diphenhydrAMINE (BENADRYL) 25 MG tablet Take 25 mg by mouth daily as needed for allergies.   unknown  . fexofenadine (ALLEGRA) 180 MG tablet Take 1 tablet (180 mg total) by mouth daily. 30 tablet 2 unknown  . lisinopril (PRINIVIL,ZESTRIL) 40 MG tablet Take 1 tablet (40 mg total) by mouth daily. 90 tablet 3 05/23/2015 at Unknown time  . Multiple Vitamin (MULITIVITAMIN WITH MINERALS) TABS Take 1 tablet by mouth daily.   05/23/2015 at Unknown time  . Multiple Vitamin (ONE-A-DAY MENS PO) Take 1 tablet by mouth daily.   05/23/2015 at Unknown time  . nitroGLYCERIN (NITROSTAT) 0.4 MG SL tablet place 1 tablet under the tongue if needed every 5 minutes for chest pain for 3 doses IF NO RELIEF AFTER 3RD DOSE CALL PRESCRIBER OR 911. 25 tablet 2 unknown  . Omega-3 Fatty Acids (FISH OIL) 1000 MG CAPS Take 1,000 mg by mouth  daily.   0 05/23/2015 at Unknown time  . pantoprazole (PROTONIX) 40 MG tablet Take 40 mg by mouth daily.   05/23/2015 at Unknown time  . traZODone (DESYREL) 50 MG tablet Take 0.5-1 tablets (25-50 mg total) by mouth at bedtime as needed for sleep. 30 tablet 3 unknown at Unknown time  . warfarin (COUMADIN) 5 MG tablet Take 1 1/2 tablets daily (Patient taking differently: Take 5-7.5 mg by mouth daily. Take one tablet on all days except on Wednesdays and Thursdays take one and one-half tablets) 45 tablet 6 Past Month at Unknown time  . polyethylene glycol-electrolytes (NULYTELY/GOLYTELY) 420 g solution Take 4,000 mLs by mouth once. 4000 mL 0 05/22/2015 at 0830    Assessment: 65 yo ED patient, PTA on Warfarin for history of AFIB Heparin for possible renal  infarct on CT scan INR sub therapeutic this AM Labs reviewed  Heparin level below goal  Goal of Therapy:  Heparin level 0.3-0.7 units/ml Monitor platelets by anticoagulation protocol: Yes   Plan:  Increase heparin rate to 1800 units/hour Unfractionated heparin level at 5 AM and daily   Tye Juarez [employer] 05/24/2015,10:32 PM

## 2015-05-24 NOTE — ED Notes (Signed)
Attempted report x1. 

## 2015-05-25 LAB — CBC
HCT: 37.2 % — ABNORMAL LOW (ref 39.0–52.0)
Hemoglobin: 12.9 g/dL — ABNORMAL LOW (ref 13.0–17.0)
MCH: 30 pg (ref 26.0–34.0)
MCHC: 34.7 g/dL (ref 30.0–36.0)
MCV: 86.5 fL (ref 78.0–100.0)
Platelets: 141 10*3/uL — ABNORMAL LOW (ref 150–400)
RBC: 4.3 MIL/uL (ref 4.22–5.81)
RDW: 14.5 % (ref 11.5–15.5)
WBC: 7.8 10*3/uL (ref 4.0–10.5)

## 2015-05-25 LAB — PROTIME-INR
INR: 1.35 (ref 0.00–1.49)
Prothrombin Time: 16.8 seconds — ABNORMAL HIGH (ref 11.6–15.2)

## 2015-05-25 LAB — HEPARIN LEVEL (UNFRACTIONATED)
Heparin Unfractionated: 0.28 IU/mL — ABNORMAL LOW (ref 0.30–0.70)
Heparin Unfractionated: 0.21 IU/mL — ABNORMAL LOW (ref 0.30–0.70)

## 2015-05-25 MED ORDER — WARFARIN SODIUM 5 MG PO TABS
7.5000 mg | ORAL_TABLET | Freq: Once | ORAL | Status: AC
  Administered 2015-05-25: 7.5 mg via ORAL
  Filled 2015-05-25: qty 2

## 2015-05-25 MED ORDER — ZOLPIDEM TARTRATE 5 MG PO TABS
5.0000 mg | ORAL_TABLET | Freq: Once | ORAL | Status: AC
  Administered 2015-05-25: 5 mg via ORAL
  Filled 2015-05-25: qty 1

## 2015-05-25 NOTE — Progress Notes (Signed)
ANTICOAGULATION CONSULT NOTE - Follow Up Consult  Pharmacy Consult for Heparin and Warfarin Indication: atrial fibrillation  No Known Allergies  Patient Measurements: Height: 6' (182.9 cm) Weight: 189 lb (85.73 kg) IBW/kg (Calculated) : 77.6 Heparin Dosing Weight: 85.7 kg  Vital Signs: Temp: 97.8 F (36.6 C) (01/22 0539) Temp Source: Oral (01/22 0539) BP: 162/83 mmHg (01/22 0539) Pulse Rate: 94 (01/22 0539)  Labs:  Recent Labs  05/24/15 0112 05/24/15 1357 05/24/15 2134 05/25/15 0619  HGB 14.5  --   --  12.9*  HCT 41.8  --   --  37.2*  PLT 147*  --   --  141*  LABPROT 14.9  --   --  16.8*  INR 1.15  --   --  1.35  HEPARINUNFRC  --  <0.10* 0.15* 0.28*  CREATININE 1.38*  --   --   --     Estimated Creatinine Clearance: 60.9 mL/min (by C-G formula based on Cr of 1.38).   Medications:  Scheduled:  . amLODipine  10 mg Oral Daily  . atorvastatin  20 mg Oral QHS  . lisinopril  40 mg Oral Daily  . sodium chloride  3 mL Intravenous Q12H  . Warfarin - Pharmacist Dosing Inpatient   Does not apply Q24H    Assessment: Admitted from ED Abdominal Pain: hypoperfusion noted on CT scan and possible with patient recently being off coumadin for colonoscopy to have developed an embolus that could have traveled to the kidney. Vascular surgeon did review films and suggested follow up in 2 days with CT angiogram of the kidneys but did not suggest surgical intervention at this time Heparin started in ED, bridge with Coumadin. INR 1.15 on admission. Last dose indicated 05/18/2015 Heparin level slightly below goal this AM INR remains sub therapeutic after Coumadin 10 mg last evening Slight decrease in HGB/HCT, no signs of bleedling  Goal of Therapy:  INR 2-3 Heparin level 0.3-0.7 units/ml Monitor platelets by anticoagulation protocol: Yes   Plan:  Increase Heparin rate to 1900 units/hour Heparin level in 6-8 hours and then daily Coumadin 7.5 mg po x 1 dose tonight INR/PT  daily Monitor CBC/platelets, signs of bleeding   Abner Greenspan, Jerrol Helmers Bennett 05/25/2015,12:13 PM

## 2015-05-25 NOTE — Progress Notes (Signed)
ANTICOAGULATION CONSULT NOTE - Follow Up Consult  Pharmacy Consult for Heparin and Warfarin Indication: atrial fibrillation  No Known Allergies  Patient Measurements: Height: 6' (182.9 cm) Weight: 189 lb (85.73 kg) IBW/kg (Calculated) : 77.6 Heparin Dosing Weight: 85.7 kg  Vital Signs: Temp: 97.8 F (36.6 C) (01/22 1413) Temp Source: Oral (01/22 1413) BP: 159/95 mmHg (01/22 1413) Pulse Rate: 106 (01/22 1413)  Labs:  Recent Labs  05/24/15 0112  05/24/15 2134 05/25/15 0619 05/25/15 1836  HGB 14.5  --   --  12.9*  --   HCT 41.8  --   --  37.2*  --   PLT 147*  --   --  141*  --   LABPROT 14.9  --   --  16.8*  --   INR 1.15  --   --  1.35  --   HEPARINUNFRC  --   < > 0.15* 0.28* 0.21*  CREATININE 1.38*  --   --   --   --   < > = values in this interval not displayed.  Estimated Creatinine Clearance: 60.9 mL/min (by C-G formula based on Cr of 1.38).   Medications:  Scheduled:  . amLODipine  10 mg Oral Daily  . atorvastatin  20 mg Oral QHS  . lisinopril  40 mg Oral Daily  . sodium chloride  3 mL Intravenous Q12H  . Warfarin - Pharmacist Dosing Inpatient   Does not apply Q24H    Assessment: Admitted from ED Abdominal Pain: hypoperfusion noted on CT scan and possible with patient recently being off coumadin for colonoscopy to have developed an embolus that could have traveled to the kidney. Vascular surgeon did review films and suggested follow up in 2 days with CT angiogram of the kidneys but did not suggest surgical intervention at this time Heparin started in ED, bridge with Coumadin. INR 1.15 on admission. Last dose indicated 05/18/2015 Heparin level slightly below goal this AM INR remains sub therapeutic  Slight decrease in HGB/HCT, no signs of bleedling  Goal of Therapy:  INR 2-3 Heparin level 0.3-0.7 units/ml Monitor platelets by anticoagulation protocol: Yes   Plan:  Increase Heparin rate to 2050 units/hour Heparin level at 5 AM  and then daily Monitor  CBC/platelets, signs of bleeding   Abner Greenspan, Meghan Warshawsky Bennett 05/25/2015,7:48 PM

## 2015-05-25 NOTE — Progress Notes (Signed)
[facility] PROGRESS NOTE  Javious Terrana Amore [id number] DOB: 1951-03-07 DOA: 05/24/2015 PCP: Vic Blackbird, MD  Assessment/Plan: 1. Abdominal pain: evidence of hypoperfusion to right kidney on CT scan with mildly increased Cr. No surgical intervention at this time; CT angiogram to be performed tomorrow 2. Atrial fibrillation: rate controlled 3. HTN: continue amlodipine and lisinopril 4. VTE prophylaxis: on heparin 5. Diet: regular diet  Code Status: full Family Communication: son at bedside voiced no questions Disposition Plan: discharge home in 2-3 days   Consultants:  n/a  Procedures:  n/a  Antibiotics:  None   HPI/Subjective: Patient doing well today.  Slept well.  Reports not chest pain, shortness of breath, abdominal pain.  Understands the plan and asked appropriate questions about dye use effects on kidneys.  Eating well.  Vital signs WNL overnight.  Objective: Filed Vitals:   05/24/15 2143 05/25/15 0539  BP: 147/68 162/83  Pulse: 94 94  Temp: 98.2 F (36.8 C) 97.8 F (36.6 C)  Resp: 18 18    Intake/Output Summary (Last 24 hours) at 05/25/15 1109 Last data filed at 05/25/15 0900  Gross per 24 hour  Intake 2199.91 ml  Output      0 ml  Net 2199.91 ml   Filed Weights   05/24/15 0035  Weight: 85.73 kg (189 lb)    Exam:   General:  No acute distress, sitting on bed comfortably conversing with son  Cardiovascular: S1S2, irregularly irregular rhythm, no murmurs or gallops, 2+ peripheral pulses radial and dorsalis pedis  Respiratory: few rhonchi in lung bases but otherwise clear  Abdomen: soft, nontender, nondistended, bowel sounds in all quadrants, no CVA tenderness  Musculoskeletal: FROM UE and LE bilaterally   Data Reviewed: Basic Metabolic Panel:  Recent Labs Lab 05/24/15 0112  NA 136  K 4.0  CL 103  CO2 25  GLUCOSE 132*  BUN 10  CREATININE 1.38*  CALCIUM 9.7   Liver Function Tests:  Recent Labs Lab 05/24/15 0112   AST 20  ALT 12*  ALKPHOS 85  BILITOT 1.1  PROT 7.0  ALBUMIN 3.5    Recent Labs Lab 05/24/15 0112  LIPASE 48   No results for input(s): AMMONIA in the last 168 hours. CBC:  Recent Labs Lab 05/24/15 0112 05/25/15 0619  WBC 13.3* 7.8  HGB 14.5 12.9*  HCT 41.8 37.2*  MCV 85.1 86.5  PLT 147* 141*   Cardiac Enzymes: No results for input(s): CKTOTAL, CKMB, CKMBINDEX, TROPONINI in the last 168 hours. BNP (last 3 results) No results for input(s): BNP in the last 8760 hours.  ProBNP (last 3 results) No results for input(s): PROBNP in the last 8760 hours.  CBG: No results for input(s): GLUCAP in the last 168 hours.  No results found for this or any previous visit (from the past 240 hour(s)).   Studies: Dg Chest 2 View  05/24/2015  CLINICAL DATA:  65 year old male with nausea and vomiting and sharp right lower quadrant abdominal pain EXAM: CHEST  2 VIEW COMPARISON:  Radiograph dated 04/09/2015 FINDINGS: The heart size and mediastinal contours are within normal limits. Both lungs are clear. The visualized skeletal structures are unremarkable. IMPRESSION: No active cardiopulmonary disease. Electronically Signed   By: Anner Crete M.D.   On: 05/24/2015 04:45   Ct Abdomen Pelvis W Contrast  05/24/2015  CLINICAL DATA:  65 year old male with abdominal pain. Status post colonoscopy. EXAM: CT ABDOMEN AND PELVIS WITH CONTRAST TECHNIQUE: Multidetector CT imaging of the abdomen and pelvis was performed using the  standard protocol following bolus administration of intravenous contrast. CONTRAST:  100 cc Omnipaque 300 COMPARISON:  CT dated 12/19/2007 FINDINGS: The visualized lung bases are clear. No intra-abdominal free air or free fluid. The liver, gallbladder, pancreas, spleen, right adrenal gland appear unremarkable. A 1.3 cm indeterminate left adrenal nodule, likely an adenoma. There is moderate right renal atrophy, increased from prior study. There is asymmetric hypo enhancement of the  right renal parenchyma compatible with hypoperfusion likely related to vascular compromise. Ultrasound with duplex interrogation of the right renal artery or CT angiography is recommended for better evaluation. There is no hydronephrosis on the right. A 4 mm nonobstructing left renal upper pole calculus. No hydronephrosis. The visualized ureters and urinary bladder appear unremarkable. The prostate and seminal vesicles are grossly unremarkable. Small hiatal hernia with gastroesophageal reflux. Oral contrast opacifies the stomach and loops of small bowel. There is no evidence of bowel obstruction or inflammation. Appendectomy. Aortoiliac atherosclerotic disease. There is a nonenhancing 3.5 x 3.6 cm (previously 2.5 x 2.7 cm) the structure to the left of the aorta at the level of the left renal artery. There is thin calcification of the wall of this structure. 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 Other etiologies are less likely. Follow-up recommended. There is a 3.3 cm infrarenal abdominal aortic aneurysm (previously 3.0 cm). No portal venous gas identified. There is no adenopathy. The abdominal wall soft tissues appear unremarkable. There is degenerative changes of the spine. No acute fracture. IMPRESSION: Move no pneumoperitoneum or evidence of traumatic bowel injury. Moderate right renal atrophy with hypoperfusion of the right kidney concerning for vascular compromise. Further evaluation of the right kidney as well as interrogation of the right renal artery with duplex ultrasound or CT angiography recommended. Nonobstructing 4 mm left renal calculus. 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 Follow-up recommended. A 3.3 cm infrarenal abdominal aortic aneurysm. Recommend followup by ultrasound in 3 years. This recommendation follows ACR consensus guidelines: White Paper of the ACR Incidental Findings Committee II on Vascular Findings. J Am Coll  Radiol [id number] Electronically Signed   By: Anner Crete M.D.   On: 05/24/2015 04:41    Scheduled Meds: . sodium chloride   Intravenous STAT  . amLODipine  10 mg Oral Daily  . atorvastatin  20 mg Oral QHS  . lisinopril  40 mg Oral Daily  . sodium chloride  3 mL Intravenous Q12H  . Warfarin - Pharmacist Dosing Inpatient   Does not apply Q24H   Continuous Infusions: . sodium chloride 100 mL/hr at 05/25/15 0539  . heparin 1,900 Units/hr (05/25/15 1015)    Active Problems:   Abdominal pain    Time spent: 30 minutes    [place] 05/25/2015, 11:09 AM  LOS: 1 day

## 2015-05-26 ENCOUNTER — Encounter (HOSPITAL_COMMUNITY): Payer: Self-pay | Admitting: Family Medicine

## 2015-05-26 ENCOUNTER — Inpatient Hospital Stay (HOSPITAL_COMMUNITY): Payer: Medicare Other

## 2015-05-26 DIAGNOSIS — R109 Unspecified abdominal pain: Secondary | ICD-10-CM | POA: Diagnosis not present

## 2015-05-26 DIAGNOSIS — I714 Abdominal aortic aneurysm, without rupture: Secondary | ICD-10-CM | POA: Diagnosis not present

## 2015-05-26 LAB — CBC WITH DIFFERENTIAL/PLATELET
Basophils Absolute: 0 10*3/uL (ref 0.0–0.1)
Basophils Relative: 0 %
Eosinophils Absolute: 0.1 10*3/uL (ref 0.0–0.7)
Eosinophils Relative: 1 %
HCT: 38.3 % — ABNORMAL LOW (ref 39.0–52.0)
Hemoglobin: 13.4 g/dL (ref 13.0–17.0)
Lymphocytes Relative: 32 %
Lymphs Abs: 2.2 10*3/uL (ref 0.7–4.0)
MCH: 29.5 pg (ref 26.0–34.0)
MCHC: 35 g/dL (ref 30.0–36.0)
MCV: 84.4 fL (ref 78.0–100.0)
Monocytes Absolute: 0.6 10*3/uL (ref 0.1–1.0)
Monocytes Relative: 9 %
Neutro Abs: 3.9 10*3/uL (ref 1.7–7.7)
Neutrophils Relative %: 58 %
Platelets: 163 10*3/uL (ref 150–400)
RBC: 4.54 MIL/uL (ref 4.22–5.81)
RDW: 14 % (ref 11.5–15.5)
WBC: 6.8 10*3/uL (ref 4.0–10.5)

## 2015-05-26 LAB — CBC
HCT: 38.3 % — ABNORMAL LOW (ref 39.0–52.0)
Hemoglobin: 13 g/dL (ref 13.0–17.0)
MCH: 29 pg (ref 26.0–34.0)
MCHC: 33.9 g/dL (ref 30.0–36.0)
MCV: 85.3 fL (ref 78.0–100.0)
Platelets: 162 10*3/uL (ref 150–400)
RBC: 4.49 MIL/uL (ref 4.22–5.81)
RDW: 14.1 % (ref 11.5–15.5)
WBC: 6.5 10*3/uL (ref 4.0–10.5)

## 2015-05-26 LAB — PROTIME-INR
INR: 1.34 (ref 0.00–1.49)
Prothrombin Time: 16.7 seconds — ABNORMAL HIGH (ref 11.6–15.2)

## 2015-05-26 LAB — BASIC METABOLIC PANEL
Anion gap: 10 (ref 5–15)
BUN: 13 mg/dL (ref 6–20)
CO2: 23 mmol/L (ref 22–32)
Calcium: 9.5 mg/dL (ref 8.9–10.3)
Chloride: 104 mmol/L (ref 101–111)
Creatinine, Ser: 1.45 mg/dL — ABNORMAL HIGH (ref 0.61–1.24)
GFR calc Af Amer: 58 mL/min — ABNORMAL LOW (ref >60)
GFR calc non Af Amer: 50 mL/min — ABNORMAL LOW (ref >60)
Glucose, Bld: 111 mg/dL — ABNORMAL HIGH (ref 65–99)
Potassium: 3.7 mmol/L (ref 3.5–5.1)
Sodium: 137 mmol/L (ref 135–145)

## 2015-05-26 LAB — HEPARIN LEVEL (UNFRACTIONATED): Heparin Unfractionated: 0.4 IU/mL (ref 0.30–0.70)

## 2015-05-26 MED ORDER — POLYETHYLENE GLYCOL 3350 17 G PO PACK
17.0000 g | PACK | Freq: Every day | ORAL | Status: DC | PRN
  Administered 2015-05-26: 17 g via ORAL
  Filled 2015-05-26: qty 1

## 2015-05-26 MED ORDER — IOHEXOL 350 MG/ML SOLN
100.0000 mL | Freq: Once | INTRAVENOUS | Status: AC | PRN
  Administered 2015-05-26: 100 mL via INTRAVENOUS

## 2015-05-26 MED ORDER — ZOLPIDEM TARTRATE 5 MG PO TABS
5.0000 mg | ORAL_TABLET | Freq: Every evening | ORAL | Status: DC | PRN
  Administered 2015-05-26 – 2015-05-29 (×3): 5 mg via ORAL
  Filled 2015-05-26 (×3): qty 1

## 2015-05-26 MED ORDER — WARFARIN SODIUM 5 MG PO TABS: 10.0000 mg | ORAL_TABLET | Freq: Once | ORAL | Status: DC

## 2015-05-26 MED ORDER — LABETALOL HCL 5 MG/ML IV SOLN
10.0000 mg | INTRAVENOUS | Status: AC
  Administered 2015-05-26: 10 mg via INTRAVENOUS
  Filled 2015-05-26: qty 4

## 2015-05-26 NOTE — Consult Note (Addendum)
Vascular Surgery Consultation  Reason for Consult: Abdominal aortic aneurysm with recent history of abdominal pain  HPI: [name] [name] is a 65 y.o. male who presents for evaluation of abdominal aortic aneurysm with recent history of abdominal pain. This patient is followed by Dr. 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 This has not required surgical treatment thus far. Patient also has known mild renal insufficiency. Patient had routine colonoscopy performed on January 19 which revealed multiple polyps and polypectomies were performed. Patient developed abdominal discomfort the following day. He was seen twice in the last 3 days in the emergency department at [facility]. The abdominal pain has resolved. Today patient had CT angiogram of the abdomen and pelvis which revealed some enlargement of the pseudoaneurysm since last studied by Dr. Oneida Alar. Patient states that the abdominal pain has completely resolved. Patient was referred to [facility] for further evaluation and treatment plans.   Past Medical History  Diagnosis Date  . Arteriosclerotic cardiovascular disease (ASCVD)     AMI in 2000 treated at [facility]; cath in 12/2006->  Chronic total obstruction of the RCA;  drug-eluting stent placed in M1; inferior hypokinesis with an EF of 45%  . Tobacco abuse, in remission     20 pack years; quit in 2009  . Hypertension   . Chronic anticoagulation   . Atrial fibrillation ([place])     on coumadin for couple of years, stopped plavix at that time  . PVD (peripheral vascular disease) ([place])     Ct angiogram in 2009 revealed stable disease with 80% celiac stenosis,50% right renal artery ,ASCVD with ulceration in the abdominal aortashe  . Nephrolithiasis   . Cerebrovascular disease 2002    carotid stent  . Hyperlipidemia   . Myocardial infarction ([place]) 10 yrs ago  . Dysphagia   . Testicular carcinoma ([place]) 1990    right  orchiectomy  . AAA (abdominal aortic aneurysm) ([place]) 2017    3.3cm   Past Surgical History  Procedure Laterality Date  . Appendectomy  2004  . Testicular cancer  1990    right orchiectomy  . Colonoscopy  06/17/2011    INCOMPLETE, PREP POOR. Procedure: COLONOSCOPY;  Surgeon: Daneil Dolin, MD;  Location: [facility];  Service: Endoscopy;  Laterality: N/A;  10:00  . Esophagogastroduodenoscopy  06/17/2011    severe erosive/ulcerative reflux esophagitis, soft noncritical stricture dilatied, small hh, antral erosion   . Colonoscopy  07/15/2011    [id number] rectal and colon polyps   Social History   Social History  . Marital Status: Married    Spouse Name: N/A  . Number of Children: 2  . Years of Education: N/A   Occupational History  . disabled    Social History Main Topics  . Smoking status: Former Smoker -- 0.50 packs/day for 40 years    Types: Cigarettes    Quit date: 06/16/2008  . Smokeless tobacco: Never Used  . Alcohol Use: No  . Drug Use: No  . Sexual Activity: Yes    Birth Control/ Protection: None   Other Topics Concern  . None   Social History Narrative   Family History  Problem Relation Age of Onset  . Colon cancer Father 85    deceased  . Prostate cancer Father   . Liver disease Neg Hx   . Anesthesia problems Neg Hx   . Hypotension Neg Hx   . Malignant hyperthermia Neg Hx   .  Pseudochol deficiency Neg Hx    No Known Allergies Prior to Admission medications   Medication Sig Start Date End Date Taking? Authorizing Provider  albuterol (PROVENTIL HFA;VENTOLIN HFA) 108 (90 BASE) MCG/ACT inhaler Inhale 2-4 puffs into the lungs every 4 (four) hours as needed for wheezing or shortness of breath. 04/10/15  Yes Kristen N Ward, DO  amLODipine (NORVASC) 10 MG tablet take 1 tablet by mouth once daily 07/16/14  Yes Alycia Rossetti, MD  atorvastatin (LIPITOR) 20 MG tablet Take 1 tablet (20 mg total) by mouth at bedtime. 07/22/14  Yes Alycia Rossetti, MD  cloNIDine  (CATAPRES) 0.1 MG tablet Take 1 tablet (0.1 mg total) by mouth 2 (two) times daily. 11/07/14 11/07/15 Yes Lendon Colonel, NP  diphenhydrAMINE (BENADRYL) 25 MG tablet Take 25 mg by mouth daily as needed for allergies.   Yes Historical Provider, MD  fexofenadine (ALLEGRA) 180 MG tablet Take 1 tablet (180 mg total) by mouth daily. 07/01/11  Yes Alycia Rossetti, MD  lisinopril (PRINIVIL,ZESTRIL) 40 MG tablet Take 1 tablet (40 mg total) by mouth daily. 11/07/14  Yes Lendon Colonel, NP  Multiple Vitamin (MULITIVITAMIN WITH MINERALS) TABS Take 1 tablet by mouth daily.   Yes Historical Provider, MD  Multiple Vitamin (ONE-A-DAY MENS PO) Take 1 tablet by mouth daily.   Yes Historical Provider, MD  nitroGLYCERIN (NITROSTAT) 0.4 MG SL tablet place 1 tablet under the tongue if needed every 5 minutes for chest pain for 3 doses IF NO RELIEF AFTER 3RD DOSE CALL PRESCRIBER OR 911. 12/16/14  Yes Lendon Colonel, NP  Omega-3 Fatty Acids (FISH OIL) 1000 MG CAPS Take 1,000 mg by mouth daily.  11/12/11  Yes Yehuda Savannah, MD  pantoprazole (PROTONIX) 40 MG tablet Take 40 mg by mouth daily.   Yes Historical Provider, MD  traZODone (DESYREL) 50 MG tablet Take 0.5-1 tablets (25-50 mg total) by mouth at bedtime as needed for sleep. 07/16/14  Yes Alycia Rossetti, MD  warfarin (COUMADIN) 5 MG tablet Take 1 1/2 tablets daily Patient taking differently: Take 5-7.5 mg by mouth daily. Take one tablet on all days except on Wednesdays and Thursdays take one and one-half tablets 11/07/14  Yes Lendon Colonel, NP  polyethylene glycol-electrolytes (NULYTELY/GOLYTELY) 420 g solution Take 4,000 mLs by mouth once. 05/14/15   Mahala Menghini, PA-C     Positive ROS: Occasional chest discomfort. Has history of coronary artery disease with PTCA and stenting in 1989 he states. He developed some hip discomfort with ambulation. No calf claudication reported.  All other systems have been reviewed and were otherwise negative with the  exception of those mentioned in the HPI and as above.  Physical Exam: Filed Vitals:   05/26/15 1902 05/26/15 2153  BP: 160/85 135/70  Pulse: 76 91  Temp: 98.8 F (37.1 C) 99.1 F (37.3 C)  Resp: 18 18    General: Alert, no acute distress HEENT: Normal for age Cardiovascular: Regular rate and rhythm. Carotid pulses 2+, no bruits audible Respiratory: Clear to auscultation. No cyanosis, no use of accessory musculature GI: No organomegaly, abdomen is soft and non-tender Skin: No lesions in the area of chief complaint Neurologic: Sensation intact distally Psychiatric: Patient is competent for consent with normal mood and affect Musculoskeletal: No obvious deformities Extremities: 3+ femoral and posterior tibial pulses palpable bilaterally. Both feet well perfused.  Imaging reviewed: CT angiogram of abdomen and pelvis performed earlier today was reviewed. There is focal pseudoaneurysm of infrarenal abdominal aorta  extending to the patient's left side with diameter of the pseudoaneurysm being approximately 4 cm. Total diameter of aorta and pseudoaneurysm is 6 cm. Appears to be severe ostial stenosis of left renal artery with 10 cm kidney on the left and possible occlusion of right renal artery with 8 cm kidney on the right. There is distal filling of the right renal artery.   Assessment/Plan:  Complex pseudoaneurysm of proximal infrarenal aorta. Appears options would be fenestrated stent graft because of very short infrarenal neck versus open repair with consideration of revascularization of one or both renal arteries. Patient currently does not have abdominal pain and do not think his symptoms are secondary to this aortic pathology. Dr. Juanda Crumble fields will further evaluate in the morning to determine plans for follow-up or repair Discussed this with patient and he understands and agrees with this plan Patient also has history of atrial fibrillation and was on Xerralto prior to colonoscopy.  It was discontinued. Since that time patient has been started on Coumadin therapy and was on heparin drip prior to transfer. Decision will need to be made regarding anticoagulation   Tinnie Gens, MD 05/26/2015 10:02 PM

## 2015-05-26 NOTE — Progress Notes (Signed)
ATTENDING:   Patient CTA of the abdomen this afternoon showed right renal artery occlusion with right atrophic kidney (likely chronic), left renal artery stenosis with possible impingement due to the AAA with speudo-aneurysm, together measuring at 6cm. I spoke with Dr Kellie Simmering who was kind enough to offer immediate consultation.  Will transfer him to [facility] immediately. We will d/c his Coumadin and d/c his heparin until further evaluated by him. He is under hospitalist service.  Patient was informed of plan and told that if AAA ruptures, it will be catastrophic.   He accepted being transferred,  RN notified as well.   Thank you,  Orvan Falconer, MD.  Rosalita Chessman.

## 2015-05-26 NOTE — Progress Notes (Signed)
[facility] PROGRESS NOTE  [name] [name] [id number] DOB: 02/20/1951 DOA: 05/24/2015 PCP: Vic Blackbird, MD  Assessment/Plan: 1. Abdominal pain: evidence of hypoperfusion to right kidney on CT scan with mildly increased Cr. No surgical intervention at this time; CT angiogram today- will follow up on results; no bowel movement for 5 days- will order Miralax PRN 2. Atrial fibrillation: rate controlled; INR 1.34 (pharmacy dosing coumadin) 10mg  Warfarin to be given today. 3. HTN: continue amlodipine and lisinopril; BP slightly elevated- patient states he runs [id number] systolic at home 4. VTE prophylaxis: on heparin 5. Diet: regular diet  Code Status: full Family Communication: no family at bedside Disposition Plan: discharge home in 2-3 days   Consultants:  n/a  Procedures:  n/a  Antibiotics:  None   HPI/Subjective: Patient doing well today and voices no concerns.  Abdominal pain has completely resolved. Patient slept well.  Reports not chest pain, shortness of breath, abdominal pain. Blood pressures slightly elevated overnight but patient says BP runs slightly hypertensive at home.  No questions voiced by patient; mentions he hopes the CT shows etiology of abdominal pain.  Patient does mention he has not had a bowel movement since colonoscopy but he is passing flatus.  Objective: Filed Vitals:   05/25/15 2151 05/26/15 0540  BP: 141/77 152/67  Pulse: 93 95  Temp: 98.7 F (37.1 C) 97.9 F (36.6 C)  Resp: 16 16    Intake/Output Summary (Last 24 hours) at 05/26/15 1015 Last data filed at 05/26/15 0830  Gross per 24 hour  Intake 4908.58 ml  Output      0 ml  Net 4908.58 ml   Filed Weights   05/24/15 0035  Weight: 85.73 kg (189 lb)    Exam:   General:  No acute distress, sitting on bed watching television  Cardiovascular: S1S2, irregularly irregular rhythm, no murmurs or gallops, 2+ peripheral pulses radial and dorsalis pedis  Respiratory: clear to  auscultation bilaterally, good respiratory effort and aeration  Abdomen: soft, nontender, nondistended, bowel sounds in all quadrants, no CVA tenderness  Musculoskeletal: FROM UE and LE bilaterally   Data Reviewed: Basic Metabolic Panel:  Recent Labs Lab 05/24/15 0112  NA 136  K 4.0  CL 103  CO2 25  GLUCOSE 132*  BUN 10  CREATININE 1.38*  CALCIUM 9.7   Liver Function Tests:  Recent Labs Lab 05/24/15 0112  AST 20  ALT 12*  ALKPHOS 85  BILITOT 1.1  PROT 7.0  ALBUMIN 3.5    Recent Labs Lab 05/24/15 0112  LIPASE 48   No results for input(s): AMMONIA in the last 168 hours. CBC:  Recent Labs Lab 05/24/15 0112 05/25/15 0619 05/26/15 0618  WBC 13.3* 7.8 6.5  HGB 14.5 12.9* 13.0  HCT 41.8 37.2* 38.3*  MCV 85.1 86.5 85.3  PLT 147* 141* 162   Cardiac Enzymes: No results for input(s): CKTOTAL, CKMB, CKMBINDEX, TROPONINI in the last 168 hours. BNP (last 3 results) No results for input(s): BNP in the last 8760 hours.  ProBNP (last 3 results) No results for input(s): PROBNP in the last 8760 hours.  CBG: No results for input(s): GLUCAP in the last 168 hours.  No results found for this or any previous visit (from the past 240 hour(s)).   Studies: No results found.  Scheduled Meds: . amLODipine  10 mg Oral Daily  . atorvastatin  20 mg Oral QHS  . lisinopril  40 mg Oral Daily  . sodium chloride  3 mL Intravenous Q12H  .  warfarin  10 mg Oral Once  . Warfarin - Pharmacist Dosing Inpatient   Does not apply Q24H   Continuous Infusions: . sodium chloride 100 mL/hr at 05/26/15 0352  . heparin 2,050 Units/hr (05/25/15 2125)    Active Problems:   Abdominal pain    Time spent: 30 minutes    Coralie Common MD Resident Physician  [facility] 05/26/2015, 10:15 AM  LOS: 2 days

## 2015-05-26 NOTE — Progress Notes (Signed)
ANTICOAGULATION CONSULT NOTE - Follow Up Consult  Pharmacy Consult for Heparin and Warfarin Indication: atrial fibrillation  No Known Allergies  Patient Measurements: Height: 6' (182.9 cm) Weight: 189 lb (85.73 kg) IBW/kg (Calculated) : 77.6 Heparin Dosing Weight: 85.7 kg  Vital Signs: Temp: 97.9 F (36.6 C) (01/23 0540) Temp Source: Oral (01/23 0540) BP: 152/67 mmHg (01/23 0540) Pulse Rate: 95 (01/23 0540)  Labs:  Recent Labs  05/24/15 0112  05/25/15 0619 05/25/15 1836 05/26/15 0618  HGB 14.5  --  12.9*  --  13.0  HCT 41.8  --  37.2*  --  38.3*  PLT 147*  --  141*  --  162  LABPROT 14.9  --  16.8*  --  16.7*  INR 1.15  --  1.35  --  1.34  HEPARINUNFRC  --   < > 0.28* 0.21* 0.40  CREATININE 1.38*  --   --   --   --   < > = values in this interval not displayed.  Estimated Creatinine Clearance: 60.9 mL/min (by C-G formula based on Cr of 1.38).   Medications:  Scheduled:  . amLODipine  10 mg Oral Daily  . atorvastatin  20 mg Oral QHS  . lisinopril  40 mg Oral Daily  . sodium chloride  3 mL Intravenous Q12H  . Warfarin - Pharmacist Dosing Inpatient   Does not apply Q24H    Assessment: Admitted from ED Abdominal Pain: hypoperfusion noted on CT scan and possible with patient recently being off coumadin for colonoscopy to have developed an embolus that could have traveled to the kidney. Vascular surgeon did review films and suggested follow up in 2 days with CT angiogram of the kidneys but did not suggest surgical intervention at this time Heparin level this am therapeutic.  INR 1.34 CBC stable  Goal of Therapy:  INR 2-3 Heparin level 0.3-0.7 units/ml Monitor platelets by anticoagulation protocol: Yes   Plan:  Continue Heparin at 2050 units/hour Coumadin 10 mg po today Heparin level, CBC and PT/INR daily Monitor for signs of bleeding  Thanks for allowing pharmacy to be a part of this patient's care.  Excell Seltzer, PharmD Clinical Pharmacist 05/26/2015,8:01  AM

## 2015-05-26 NOTE — Progress Notes (Signed)
Pt received from transport. Pt denies pain at this time. Comfortable, family at bedside, call light within reach. Dr. Kellie Simmering paged.  Fritz Pickerel, RN

## 2015-05-27 ENCOUNTER — Encounter (HOSPITAL_COMMUNITY): Payer: Self-pay | Admitting: Internal Medicine

## 2015-05-27 ENCOUNTER — Encounter: Payer: Self-pay | Admitting: Internal Medicine

## 2015-05-27 DIAGNOSIS — R109 Unspecified abdominal pain: Secondary | ICD-10-CM | POA: Diagnosis not present

## 2015-05-27 DIAGNOSIS — N182 Chronic kidney disease, stage 2 (mild): Secondary | ICD-10-CM | POA: Diagnosis not present

## 2015-05-27 DIAGNOSIS — I4891 Unspecified atrial fibrillation: Secondary | ICD-10-CM | POA: Diagnosis not present

## 2015-05-27 DIAGNOSIS — Z7901 Long term (current) use of anticoagulants: Secondary | ICD-10-CM | POA: Diagnosis not present

## 2015-05-27 DIAGNOSIS — I714 Abdominal aortic aneurysm, without rupture: Secondary | ICD-10-CM | POA: Diagnosis not present

## 2015-05-27 NOTE — Progress Notes (Signed)
[facility]                                                                              Patient Demographics  [name] [name], is a 65 y.o. male, DOB - Jun 17, 1950, [id number]  Admit date - 05/24/2015   Admitting Physician Orvan Falconer, MD  Outpatient Primary MD for the patient is Vic Blackbird, MD  LOS - 3   Chief Complaint  Patient presents with  . Abdominal Pain       Brief HPI   Patient is a 65 year old male with hypertension, atrial fibrillation  on chronic anticoagulation, PVD, CVA, hyperlipidemia, CAD had presented to [facility] on 05/23/15 for abdominal pain. 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 Subsequently he developed abdominal pain and was found to have acute kidney injury with evidence of hypoperfusion to the right kidney on the CT abdomen and pelvis.Patient underwent CT angiogram which revealed some enlargement of the pseudoaneurysm and patient was transferred to [facility] for further evaluation and vascular surgery consult.   Assessment & Plan   Principal problem Abdominal pain: Currently resolved CT abdomen showed evidence of hypoperfusion to the right kidney on CT scan CT angiogram revealed some enlargement of the seat of an aneurysm Vascular surgery consulted, patient seen by Dr. Kellie Simmering, did not feel his symptoms were secondary to aortic pathology. Dr. Oneida Alar will evaluate to determine plans for follow-up on repair. Follow-up on anticoagulation  Active problems Hypertension Continue amlodipine  Hyperlipidemia Continue statin  Acute kidney injury - Creatinine trending up,  hold lisinopril, continue gentle hydration  Code Status: Full CODE STATUS   Family Communication: Discussed in detail with the patient, all imaging results, lab results explained to the patient   Disposition Plan  Time  Spent in minutes   25 minutes   Procedures  CT abdomen  CT angiogram of the abdomen   Consults   Vascular surgery  DVT Prophylaxis   SCD's  Medications  Scheduled Meds: . amLODipine  10 mg Oral Daily  . atorvastatin  20 mg Oral QHS  . lisinopril  40 mg Oral Daily  . sodium chloride  3 mL Intravenous Q12H   Continuous Infusions: . sodium chloride 100 mL/hr at 05/26/15 2226   PRN Meds:.sodium chloride, acetaminophen **OR** acetaminophen, albuterol, diphenhydrAMINE, iohexol, ondansetron, polyethylene glycol, sodium chloride, zolpidem   Antibiotics   Anti-infectives    None        Subjective:   [name] [name] was seen and examined today.  Patient denies dizziness, chest pain, shortness of breath, abdominal pain, N/V/D/C, new weakness, numbess, tingling. No acute events overnight.    Objective:   Blood pressure 145/86, pulse 84, temperature 98.1 F (36.7 C), temperature source Oral, resp. rate 17, height 6' (1.829 m), weight 85.73 kg (189 lb), SpO2 98 %.  Wt Readings from Last 3 Encounters:  05/24/15 85.73 kg (189 lb)  05/14/15 89.177 kg (196 lb 9.6 oz)  04/22/15 91.173 kg (201 lb)     Intake/Output Summary (Last 24 hours) at 05/27/15  [place] filed at 05/27/15 0900  Gross per 24 hour  Intake    820 ml  Output      0 ml  Net    820 ml    Exam  General: Alert and oriented x 3, NAD  HEENT:  PERRLA, EOMI, Anicteric Sclera, mucous membranes moist.   Neck: Supple, no JVD, no masses  CVS: S1 S2 auscultated, no rubs, murmurs or gallops. Regular rate and rhythm.  Respiratory: Clear to auscultation bilaterally, no wheezing, rales or rhonchi  Abdomen: Soft, nontender, nondistended, + bowel sounds  Ext: no cyanosis clubbing or edema  Neuro: AAOx3, Cr N's II- XII. Strength 5/5 upper and lower extremities bilaterally  Skin: No rashes  Psych: Normal affect and demeanor, alert and oriented x3    Data Review   Micro Results No results found for this  or any previous visit (from the past 240 hour(s)).  Radiology Reports Dg Chest 2 View  05/24/2015  CLINICAL DATA:  65 year old male with nausea and vomiting and sharp right lower quadrant abdominal pain EXAM: CHEST  2 VIEW COMPARISON:  Radiograph dated 04/09/2015 FINDINGS: The heart size and mediastinal contours are within normal limits. Both lungs are clear. The visualized skeletal structures are unremarkable. IMPRESSION: No active cardiopulmonary disease. Electronically Signed   By: Anner Crete M.D.   On: 05/24/2015 04:45   Ct Angio Abdomen W/cm &/or Wo Contrast  05/26/2015  CLINICAL DATA:  Right flank pain since colonoscopy on 05/22/2015. Evaluate aortic lesion. EXAM: CT ANGIOGRAPHY ABDOMEN TECHNIQUE: Multidetector CT imaging of the abdomen was performed using the standard protocol during bolus administration of intravenous contrast. Multiplanar reconstructed images including MIPs were obtained and reviewed to evaluate the vascular anatomy. CONTRAST:  167mL OMNIPAQUE IOHEXOL 350 MG/ML SOLN COMPARISON:  05/24/2015 and 12/19/2007 FINDINGS: ARTERIAL FINDINGS: Aorta: Diffuse atherosclerotic disease in the abdominal aorta with an exophytic saccular low-density structure along the left infrarenal aspect. This structure has peripheral calcifications and no significant arterial flow within it. The saccular exophytic portion measures 4.1 x 3.8 x 3.8 cm. Overall size of the aorta with the aneurysm measures up to 6.0 cm. Exophytic portion measured 3.2 x 2.5 x 2.0 cm in 2009. The distal abdominal aorta near the IMA takeoff measures up to 3.2 cm in the AP dimension, measured 2.9 cm in 2009. Irregularity along the posterior aspect of the abdominal aorta on sequence 5, image 47 may represent the underlying arterial lesion or defect in the aorta. Celiac axis: Again noted is moderate to severe stenosis at the origin of the celiac trunk. Prominent pancreatic-duodenal branches and suspect retrograde flow in this area  related to the stenosis in the celiac trunk. Superior mesenteric: SMA is patent with mild atherosclerotic disease. Left renal: There appears to be high-grade stenosis involving the proximal left renal artery which may be related to compression from the adjacent aortic lesion. Right renal: Near occlusion of the proximal right renal artery and there is delayed enhancement of the right kidney compared to the left kidney. Inferior mesenteric:  Patent Left iliac: Atherosclerotic plaque in the proximal left common iliac artery with at least mild stenosis. Right iliac: Atherosclerotic plaque in the proximal right iliac arteries. At least mild stenosis at the origin of the right internal iliac artery. Venous findings: IVC and renal veins are patent. Portal venous system is patent. Review of the MIP images confirms the above findings. Nonvascular findings: Mild dependent atelectasis or scarring at the lung bases. No pleural effusions. Chronic fullness of the  left adrenal gland is suggestive for hyperplasia. No gross abnormality to the liver or gallbladder. No acute abnormality to the pancreas or spleen. No acute abnormality to the left kidney. Right kidney is atrophic compared to the left kidney with delayed contrast enhancement. No significant free fluid or lymphadenopathy in the abdomen. A large amount of fluid and debris in the stomach. No gross abnormality to the visualized small or large bowel. No acute bone abnormality. IMPRESSION: Enlarging saccular lesion along the left side of the infrarenal abdominal aorta. 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 This lesion is primarily thrombosed but there is a small area of irregularity along the posterior aspect of the abdominal aorta as described. Recommend vascular surgery consultation. Critical stenosis or near occlusion of the right renal artery with an atrophic right kidney and delayed contrast enhancement in the  right kidney. In addition, there appears to be significant stenosis involving the proximal left renal artery which may represent a combination of atherosclerotic disease and compression from the left aortic saccular lesion. 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 Electronically Signed   By: Markus Daft M.D.   On: 05/26/2015 13:12   Ct Abdomen Pelvis W Contrast  05/24/2015  CLINICAL DATA:  65 year old male with abdominal pain. Status post colonoscopy. EXAM: CT ABDOMEN AND PELVIS WITH CONTRAST TECHNIQUE: Multidetector CT imaging of the abdomen and pelvis was performed using the standard protocol following bolus administration of intravenous contrast. CONTRAST:  100 cc Omnipaque 300 COMPARISON:  CT dated 12/19/2007 FINDINGS: The visualized lung bases are clear. No intra-abdominal free air or free fluid. The liver, gallbladder, pancreas, spleen, right adrenal gland appear unremarkable. A 1.3 cm indeterminate left adrenal nodule, likely an adenoma. There is moderate right renal atrophy, increased from prior study. There is asymmetric hypo enhancement of the right renal parenchyma compatible with hypoperfusion likely related to vascular compromise. Ultrasound with duplex interrogation of the right renal artery or CT angiography is recommended for better evaluation. There is no hydronephrosis on the right. A 4 mm nonobstructing left renal upper pole calculus. No hydronephrosis. The visualized ureters and urinary bladder appear unremarkable. The prostate and seminal vesicles are grossly unremarkable. Small hiatal hernia with gastroesophageal reflux. Oral contrast opacifies the stomach and loops of small bowel. There is no evidence of bowel obstruction or inflammation. Appendectomy. Aortoiliac atherosclerotic disease. There is a nonenhancing 3.5 x 3.6 cm (previously 2.5 x 2.7 cm) the structure to the left of the aorta at the level of the left renal artery. There is thin calcification  of the wall of this structure. 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 Other etiologies are less likely. Follow-up recommended. There is a 3.3 cm infrarenal abdominal aortic aneurysm (previously 3.0 cm). No portal venous gas identified. There is no adenopathy. The abdominal wall soft tissues appear unremarkable. There is degenerative changes of the spine. No acute fracture. IMPRESSION: Move no pneumoperitoneum or evidence of traumatic bowel injury. Moderate right renal atrophy with hypoperfusion of the right kidney concerning for vascular compromise. Further evaluation of the right kidney as well as interrogation of the right renal artery with duplex ultrasound or CT angiography recommended. Nonobstructing 4 mm left renal calculus. 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 Follow-up recommended. A 3.3 cm infrarenal abdominal aortic aneurysm. Recommend followup by ultrasound in 3 years. This recommendation follows ACR consensus guidelines: White Paper of the ACR Incidental  Findings Committee II on Vascular Findings. J Am Coll Radiol [id number] Electronically Signed   By: Anner Crete M.D.   On: 05/24/2015 04:41    CBC  Recent Labs Lab 05/24/15 0112 05/25/15 0619 05/26/15 0618 05/26/15 1657  WBC 13.3* 7.8 6.5 6.8  HGB 14.5 12.9* 13.0 13.4  HCT 41.8 37.2* 38.3* 38.3*  PLT 147* 141* 162 163  MCV 85.1 86.5 85.3 84.4  MCH 29.5 30.0 29.0 29.5  MCHC 34.7 34.7 33.9 35.0  RDW 14.1 14.5 14.1 14.0  LYMPHSABS  --   --   --  2.2  MONOABS  --   --   --  0.6  EOSABS  --   --   --  0.1  BASOSABS  --   --   --  0.0    Chemistries   Recent Labs Lab 05/24/15 0112 05/26/15 1657  NA 136 137  K 4.0 3.7  CL 103 104  CO2 25 23  GLUCOSE 132* 111*  BUN 10 13  CREATININE 1.38* 1.45*  CALCIUM 9.7 9.5  AST 20  --   ALT 12*  --   ALKPHOS 85  --   BILITOT 1.1  --     ------------------------------------------------------------------------------------------------------------------ estimated creatinine clearance is 58 mL/min (by C-G formula based on Cr of 1.45). ------------------------------------------------------------------------------------------------------------------ No results for input(s): HGBA1C in the last 72 hours. ------------------------------------------------------------------------------------------------------------------ No results for input(s): CHOL, HDL, LDLCALC, TRIG, CHOLHDL, LDLDIRECT in the last 72 hours. ------------------------------------------------------------------------------------------------------------------ No results for input(s): TSH, T4TOTAL, T3FREE, THYROIDAB in the last 72 hours.  Invalid input(s): FREET3 ------------------------------------------------------------------------------------------------------------------ No results for input(s): VITAMINB12, FOLATE, FERRITIN, TIBC, IRON, RETICCTPCT in the last 72 hours.  Coagulation profile  Recent Labs Lab 05/24/15 0112 05/25/15 0619 05/26/15 0618  INR 1.15 1.35 1.34    No results for input(s): DDIMER in the last 72 hours.  Cardiac Enzymes No results for input(s): CKMB, TROPONINI, MYOGLOBIN in the last 168 hours.  Invalid input(s): CK ------------------------------------------------------------------------------------------------------------------ Invalid input(s): POCBNP  No results for input(s): GLUCAP in the last 72 hours.   Ailyne Pawley M.D. [facility] 05/27/2015, 10:55 AM  Pager: [phone] Between 7am to 7pm - call Pager - [phone]  After 7pm go to [email or web address] - password [email or web address]  Call night coverage person covering after 7pm

## 2015-05-27 NOTE — Care Management Important Message (Signed)
Important Message  Patient Details  Name: [name] [name] MRN: [id number] Date of Birth: 12-Apr-1951   Medicare Important Message Given:  Yes    Nathen May 05/27/2015, 3:04 PM

## 2015-05-27 NOTE — Consult Note (Signed)
Pt seen and examined.  Currently pain free.  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 Difficult to determine if this is subacute or chronic.  Needs consideration for repair. Will do arteriogram tomorrow to further define anatomy.  He will need cardiac risk stratification as he currently does get chest pain requiring nitroglycerin while sitting watching tv occasionally.  He has previously seen [facility]  NPO p midnight Consent Angio by my partner Dr Trula Slade tomorrow  Ruta Hinds, MD [facility] [place] Office: [phone] Pager: [phone]

## 2015-05-28 ENCOUNTER — Encounter (HOSPITAL_COMMUNITY)
Admission: EM | Disposition: A | Discharge status: Home / Self Care | Payer: Self-pay | Source: Home / Self Care | Attending: Emergency Medicine

## 2015-05-28 ENCOUNTER — Encounter (HOSPITAL_COMMUNITY): Payer: Self-pay | Admitting: Physician Assistant

## 2015-05-28 DIAGNOSIS — N182 Chronic kidney disease, stage 2 (mild): Secondary | ICD-10-CM | POA: Diagnosis not present

## 2015-05-28 DIAGNOSIS — I2510 Atherosclerotic heart disease of native coronary artery without angina pectoris: Secondary | ICD-10-CM | POA: Diagnosis not present

## 2015-05-28 DIAGNOSIS — Z7901 Long term (current) use of anticoagulants: Secondary | ICD-10-CM | POA: Diagnosis not present

## 2015-05-28 DIAGNOSIS — I4891 Unspecified atrial fibrillation: Secondary | ICD-10-CM | POA: Diagnosis not present

## 2015-05-28 DIAGNOSIS — I714 Abdominal aortic aneurysm, without rupture: Secondary | ICD-10-CM | POA: Diagnosis not present

## 2015-05-28 DIAGNOSIS — I10 Essential (primary) hypertension: Secondary | ICD-10-CM | POA: Diagnosis not present

## 2015-05-28 DIAGNOSIS — I481 Persistent atrial fibrillation: Secondary | ICD-10-CM | POA: Diagnosis not present

## 2015-05-28 DIAGNOSIS — R109 Unspecified abdominal pain: Secondary | ICD-10-CM | POA: Diagnosis not present

## 2015-05-28 DIAGNOSIS — I739 Peripheral vascular disease, unspecified: Secondary | ICD-10-CM

## 2015-05-28 DIAGNOSIS — E785 Hyperlipidemia, unspecified: Secondary | ICD-10-CM

## 2015-05-28 DIAGNOSIS — Z720 Tobacco use: Secondary | ICD-10-CM

## 2015-05-28 DIAGNOSIS — I255 Ischemic cardiomyopathy: Secondary | ICD-10-CM

## 2015-05-28 HISTORY — PX: PERIPHERAL VASCULAR CATHETERIZATION: SHX172C

## 2015-05-28 LAB — BASIC METABOLIC PANEL
Anion gap: 16 — ABNORMAL HIGH (ref 5–15)
BUN: 9 mg/dL (ref 6–20)
CO2: 20 mmol/L — ABNORMAL LOW (ref 22–32)
Calcium: 8.9 mg/dL (ref 8.9–10.3)
Chloride: 103 mmol/L (ref 101–111)
Creatinine, Ser: 1.43 mg/dL — ABNORMAL HIGH (ref 0.61–1.24)
GFR calc Af Amer: 59 mL/min — ABNORMAL LOW (ref >60)
GFR calc non Af Amer: 51 mL/min — ABNORMAL LOW (ref >60)
Glucose, Bld: 103 mg/dL — ABNORMAL HIGH (ref 65–99)
Potassium: 4.2 mmol/L (ref 3.5–5.1)
Sodium: 139 mmol/L (ref 135–145)

## 2015-05-28 LAB — PROTIME-INR
INR: 1.35 (ref 0.00–1.49)
Prothrombin Time: 16.8 seconds — ABNORMAL HIGH (ref 11.6–15.2)

## 2015-05-28 SURGERY — ABDOMINAL AORTOGRAM

## 2015-05-28 MED ORDER — LABETALOL HCL 5 MG/ML IV SOLN: 10.0000 mg | INTRAVENOUS | Status: DC | PRN

## 2015-05-28 MED ORDER — LIDOCAINE HCL (PF) 1 % IJ SOLN
INTRAMUSCULAR | Status: AC
  Filled 2015-05-28: qty 30

## 2015-05-28 MED ORDER — LIDOCAINE HCL (PF) 1 % IJ SOLN
INTRAMUSCULAR | Status: DC | PRN
  Administered 2015-05-28: 12 mL

## 2015-05-28 MED ORDER — FENTANYL CITRATE (PF) 100 MCG/2ML IJ SOLN
INTRAMUSCULAR | Status: DC | PRN
  Administered 2015-05-28: 25 ug via INTRAVENOUS

## 2015-05-28 MED ORDER — MIDAZOLAM HCL 2 MG/2ML IJ SOLN
INTRAMUSCULAR | Status: DC | PRN
  Administered 2015-05-28: 1 mg via INTRAVENOUS

## 2015-05-28 MED ORDER — HEPARIN (PORCINE) IN NACL 2-0.9 UNIT/ML-% IJ SOLN
INTRAMUSCULAR | Status: AC
  Filled 2015-05-28: qty 1000

## 2015-05-28 MED ORDER — MIDAZOLAM HCL 2 MG/2ML IJ SOLN
INTRAMUSCULAR | Status: AC
  Filled 2015-05-28: qty 2

## 2015-05-28 MED ORDER — ONDANSETRON HCL 4 MG/2ML IJ SOLN: 4.0000 mg | Freq: Four times a day (QID) | INTRAMUSCULAR | Status: DC | PRN

## 2015-05-28 MED ORDER — ALUM & MAG HYDROXIDE-SIMETH 200-200-20 MG/5ML PO SUSP: 15.0000 mL | ORAL | Status: DC | PRN

## 2015-05-28 MED ORDER — GUAIFENESIN-DM 100-10 MG/5ML PO SYRP: 15.0000 mL | ORAL_SOLUTION | ORAL | Status: DC | PRN

## 2015-05-28 MED ORDER — HYDRALAZINE HCL 20 MG/ML IJ SOLN: 5.0000 mg | INTRAMUSCULAR | Status: DC | PRN

## 2015-05-28 MED ORDER — FENTANYL CITRATE (PF) 100 MCG/2ML IJ SOLN
INTRAMUSCULAR | Status: AC
  Filled 2015-05-28: qty 2

## 2015-05-28 MED ORDER — DOCUSATE SODIUM 100 MG PO CAPS
100.0000 mg | ORAL_CAPSULE | Freq: Every day | ORAL | Status: DC
Start: 2015-05-29 — End: 2015-05-29
  Administered 2015-05-29: 100 mg via ORAL
  Filled 2015-05-28: qty 1

## 2015-05-28 MED ORDER — PHENOL 1.4 % MT LIQD: 1.0000 | OROMUCOSAL | Status: DC | PRN

## 2015-05-28 MED ORDER — ACETAMINOPHEN 325 MG PO TABS: 325.0000 mg | ORAL_TABLET | ORAL | Status: DC | PRN

## 2015-05-28 MED ORDER — METOPROLOL TARTRATE 25 MG PO TABS
25.0000 mg | ORAL_TABLET | Freq: Two times a day (BID) | ORAL | Status: DC
  Administered 2015-05-28 – 2015-05-29 (×3): 25 mg via ORAL
  Filled 2015-05-28 (×3): qty 1

## 2015-05-28 MED ORDER — ACETAMINOPHEN 325 MG RE SUPP: 325.0000 mg | RECTAL | Status: DC | PRN

## 2015-05-28 MED ORDER — METOPROLOL TARTRATE 1 MG/ML IV SOLN: 2.0000 mg | INTRAVENOUS | Status: DC | PRN

## 2015-05-28 MED ORDER — SODIUM CHLORIDE 0.9 % IV SOLN: INTRAVENOUS | Status: DC

## 2015-05-28 SURGICAL SUPPLY — 11 items
CATH ACCU-VU SIZ PIG 5F 70CM (CATHETERS) ×2 IMPLANT
COVER PRB 48X5XTLSCP FOLD TPE (BAG) ×1 IMPLANT
COVER PROBE 5X48 (BAG) ×1
KIT MICROINTRODUCER STIFF 5F (SHEATH) ×4 IMPLANT
KIT PV (KITS) ×2 IMPLANT
SHEATH PINNACLE 5F 10CM (SHEATH) ×2 IMPLANT
SHIELD RADPAD SCOOP 12X17 (MISCELLANEOUS) ×2 IMPLANT
SYR MEDRAD MARK V 150ML (SYRINGE) ×2 IMPLANT
TRANSDUCER W/STOPCOCK (MISCELLANEOUS) ×2 IMPLANT
TRAY PV CATH (CUSTOM PROCEDURE TRAY) ×2 IMPLANT
WIRE BENTSON .035X145CM (WIRE) ×2 IMPLANT

## 2015-05-28 NOTE — Interval H&P Note (Signed)
History and Physical Interval Note:  05/28/2015 8:31 AM  [name] [name]  has presented today for surgery, with the diagnosis of suto aneriosum of prox inferrenal aorta  The various methods of treatment have been discussed with the patient and family. After consideration of risks, benefits and other options for treatment, the patient has consented to  Procedure(s): Abdominal Aortogram w/Lower Extremity (N/A) as a surgical intervention .  The patient's history has been reviewed, patient examined, no change in status, stable for surgery.  I have reviewed the patient's chart and labs.  Questions were answered to the patient's satisfaction.     Annamarie Major

## 2015-05-28 NOTE — Progress Notes (Signed)
Site area: RFA Site Prior to Removal:  Level 0 Pressure Applied For:20 min Manual:   yes Patient Status During Pull:  stable Post Pull Site:  Level 0 Post Pull Instructions Given:   Post Pull Pulses Present: palpable Dressing Applied: clear  Bedrest begins @ 0940 till 1340 Comments:

## 2015-05-28 NOTE — Consult Note (Signed)
Pt angiogram reviewed.  I believe the best option for repair will be open repair of his infrarenal aorta with left renal artery bypass and possible right renal artery bypass.  Images and case also reviewed with several of my partners who agree.  Plan will be to do this Monday, February 6.  He will need to stop his coumadin on February 1.  Would recheck his creatinine tomorrow morning post angio and if stable ok for d/c home with resuming prior coumadin dose.   Risk benefits possible complications discussed with pt including bleeding infection renal failure requiring permanent or temporary dialysis, transfusion (20%), myocardial events (5%), death < 5%.  He understands and wishes to proceed.  Ruta Hinds, MD [facility] [place] Office: [phone] Pager: [phone]

## 2015-05-28 NOTE — Op Note (Signed)
    Patient name: [name] [name] MRN: [id number] DOB: 1950/09/06 Sex: male  05/24/2015 - 05/28/2015 Pre-operative Diagnosis: Aortic pseudoaneurysm Post-operative diagnosis:  Same Surgeon:  Annamarie Major Procedure Performed:  1.  Ultrasound-guided access, right femoral artery  2.  Abdominal aortogram     Indications:  The patient has a slowly expanding pseudoaneurysm off of his aorta.  He is here today for further imaging for better operative planning  Procedure:  The patient was identified in the holding area and taken to room 8.  The patient was then placed supine on the table and prepped and draped in the usual sterile fashion.  A time out was called.  Ultrasound was used to evaluate the right common femoral artery.  It was patent .  A digital ultrasound image was acquired.  A micropuncture needle was used to access the right common femoral artery under ultrasound guidance.  An 018 wire was advanced without resistance and a micropuncture sheath was placed.  The 018 wire was removed and a benson wire was placed.  The micropuncture sheath was exchanged for a 5 french sheath.  An omniflush catheter was advanced over the wire to the level of T-12.  An abdominal aortogram was taken in AP and lateral projection.  The catheter was then pulled down to the level of L1 and an AP abdominal aortogram and oblique images were acquired.  Findings:   Aortogram:  No significant stenosis within the suprarenal abdominal aorta however there are penetrating ulcers off of the posterior surface.  The right renal artery is occluded.  There is a high-grade stenosis within the proximal left renal artery.  Small outpouching on the left lateral side of the aorta just below the renal arteries could be the source of the pseudoaneurysm.  The infrarenal abdominal aorta remains patent.  Calcific stenosis notified within the iliac arteries    Impression:  #1  occluded right renal artery  #2  possible source of pseudoaneurysm  on left lateral side of aorta just below the renal artery  #3  moderate calcific stenosis and bilateral iliac arteries right greater than left  #4  diseased suprarenal aorta    V. Annamarie Major, M.D. [facility] [place] Office: [phone] Pager:  [phone]

## 2015-05-28 NOTE — Progress Notes (Signed)
[facility]                                                                              Patient Demographics  Davarian Lohse, is a 65 y.o. male, DOB - 01-May-1951, [id number]  Admit date - 05/24/2015   Admitting Physician Orvan Falconer, MD  Outpatient Primary MD for the patient is Vic Blackbird, MD  LOS - 4   Chief Complaint  Patient presents with  . Abdominal Pain       Brief HPI   Patient is a 65 year old male with hypertension, atrial fibrillation  on chronic anticoagulation, PVD, CVA, hyperlipidemia, CAD had presented to [facility] on 05/23/15 for abdominal pain. 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 Subsequently he developed abdominal pain and was found to have acute kidney injury with evidence of hypoperfusion to the right kidney on the CT abdomen and pelvis.Patient underwent CT angiogram which revealed some enlargement of the pseudoaneurysm and patient was transferred to [facility] for further evaluation and vascular surgery consult.   Assessment & Plan   Principal problem Abdominal pain: Currently resolved -CT abdomen showed evidence of hypoperfusion to the right kidney on CT scan -CT angiogram revealed some enlargement of the seat of an aneurysm -Vascular surgery consulted, patient seen by Dr. Kellie Simmering, did not feel his symptoms were secondary to aortic pathology. Dr. Oneida Alar will evaluate to determine plans for follow-up on repair. - Per vascular surgery, abdominal aortogram with lower extremity planned today - Also recommended cardiac clearance, discussed with cardiology, consult called, patient followed with Dr. Lattie Haw in [place], has CAD with MI in 2000, had DES laced in M1, chronic total obstruction of RCA  Active problems Hypertension Continue amlodipine  Hyperlipidemia Continue statin  Acute  kidney injury - Creatinine stable, hold lisinopril, continue gentle hydration  Code Status: Full CODE STATUS   Family Communication: Discussed in detail with the patient, all imaging results, lab results explained to the patient   Disposition Plan  Time Spent in minutes   25 minutes   Procedures  CT abdomen  CT angiogram of the abdomen   Consults   Vascular surgery  DVT Prophylaxis   SCD's  Medications  Scheduled Meds: . amLODipine  10 mg Oral Daily  . atorvastatin  20 mg Oral QHS  . [START ON 05/29/2015] docusate sodium  100 mg Oral Daily  . sodium chloride  3 mL Intravenous Q12H   Continuous Infusions: . sodium chloride 100 mL/hr at 05/27/15 2042  . sodium chloride 50 mL/hr at 05/28/15 0918   PRN Meds:.sodium chloride, acetaminophen **OR** acetaminophen, albuterol, alum & mag hydroxide-simeth, diphenhydrAMINE, guaiFENesin-dextromethorphan, hydrALAZINE, iohexol, labetalol, metoprolol, ondansetron, phenol, polyethylene glycol, sodium chloride, zolpidem   Antibiotics   Anti-infectives    None        Subjective:   Deylan Monjaras was seen and examined today.  Patient denies dizziness, chest pain, shortness of breath, abdominal pain, N/V/D/C, new weakness, numbess, tingling. No acute events overnight.  Awaiting vascular surgery for surgery intervention.  Objective:  Blood pressure 183/89, pulse 52, temperature 98 F (36.7 C), temperature source Oral, resp. rate 25, height 6' (1.829 m), weight 85.73 kg (189 lb), SpO2 99 %.  Wt Readings from Last 3 Encounters:  05/24/15 85.73 kg (189 lb)  05/14/15 89.177 kg (196 lb 9.6 oz)  04/22/15 91.173 kg (201 lb)     Intake/Output Summary (Last 24 hours) at 05/28/15 1114 Last data filed at 05/27/15 1728  Gross per 24 hour  Intake    480 ml  Output    300 ml  Net    180 ml    Exam  General: Alert and oriented x 3, NAD  HEENT:  PERRLA, EOMI, Anicteric Sclera, mucous membranes moist.   Neck: Supple, no JVD, no  masses  CVS: S1 S2 auscultated, no rubs, murmurs or gallops. Regular rate and rhythm.  Respiratory: Clear to auscultation bilaterally, no wheezing, rales or rhonchi  Abdomen: Soft, nontender, nondistended, + bowel sounds  Ext: no cyanosis clubbing or edema  Neuro: no new deficits  Skin: No rashes  Psych: Normal affect and demeanor, alert and oriented x3    Data Review   Micro Results No results found for this or any previous visit (from the past 240 hour(s)).  Radiology Reports Dg Chest 2 View  05/24/2015  CLINICAL DATA:  65 year old male with nausea and vomiting and sharp right lower quadrant abdominal pain EXAM: CHEST  2 VIEW COMPARISON:  Radiograph dated 04/09/2015 FINDINGS: The heart size and mediastinal contours are within normal limits. Both lungs are clear. The visualized skeletal structures are unremarkable. IMPRESSION: No active cardiopulmonary disease. Electronically Signed   By: Anner Crete M.D.   On: 05/24/2015 04:45   Ct Angio Abdomen W/cm &/or Wo Contrast  05/26/2015  CLINICAL DATA:  Right flank pain since colonoscopy on 05/22/2015. Evaluate aortic lesion. EXAM: CT ANGIOGRAPHY ABDOMEN TECHNIQUE: Multidetector CT imaging of the abdomen was performed using the standard protocol during bolus administration of intravenous contrast. Multiplanar reconstructed images including MIPs were obtained and reviewed to evaluate the vascular anatomy. CONTRAST:  124mL OMNIPAQUE IOHEXOL 350 MG/ML SOLN COMPARISON:  05/24/2015 and 12/19/2007 FINDINGS: ARTERIAL FINDINGS: Aorta: Diffuse atherosclerotic disease in the abdominal aorta with an exophytic saccular low-density structure along the left infrarenal aspect. This structure has peripheral calcifications and no significant arterial flow within it. The saccular exophytic portion measures 4.1 x 3.8 x 3.8 cm. Overall size of the aorta with the aneurysm measures up to 6.0 cm. Exophytic portion measured 3.2 x 2.5 x 2.0 cm in 2009. The distal  abdominal aorta near the IMA takeoff measures up to 3.2 cm in the AP dimension, measured 2.9 cm in 2009. Irregularity along the posterior aspect of the abdominal aorta on sequence 5, image 47 may represent the underlying arterial lesion or defect in the aorta. Celiac axis: Again noted is moderate to severe stenosis at the origin of the celiac trunk. Prominent pancreatic-duodenal branches and suspect retrograde flow in this area related to the stenosis in the celiac trunk. Superior mesenteric: SMA is patent with mild atherosclerotic disease. Left renal: There appears to be high-grade stenosis involving the proximal left renal artery which may be related to compression from the adjacent aortic lesion. Right renal: Near occlusion of the proximal right renal artery and there is delayed enhancement of the right kidney compared to the left kidney. Inferior mesenteric:  Patent Left iliac: Atherosclerotic plaque in the proximal left common iliac artery with at least mild stenosis. Right iliac: Atherosclerotic plaque in the proximal right  iliac arteries. At least mild stenosis at the origin of the right internal iliac artery. Venous findings: IVC and renal veins are patent. Portal venous system is patent. Review of the MIP images confirms the above findings. Nonvascular findings: Mild dependent atelectasis or scarring at the lung bases. No pleural effusions. Chronic fullness of the left adrenal gland is suggestive for hyperplasia. No gross abnormality to the liver or gallbladder. No acute abnormality to the pancreas or spleen. No acute abnormality to the left kidney. Right kidney is atrophic compared to the left kidney with delayed contrast enhancement. No significant free fluid or lymphadenopathy in the abdomen. A large amount of fluid and debris in the stomach. No gross abnormality to the visualized small or large bowel. No acute bone abnormality. IMPRESSION: Enlarging saccular lesion along the left side of the infrarenal  abdominal aorta. 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 This lesion is primarily thrombosed but there is a small area of irregularity along the posterior aspect of the abdominal aorta as described. Recommend vascular surgery consultation. Critical stenosis or near occlusion of the right renal artery with an atrophic right kidney and delayed contrast enhancement in the right kidney. In addition, there appears to be significant stenosis involving the proximal left renal artery which may represent a combination of atherosclerotic disease and compression from the left aortic saccular lesion. 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 Electronically Signed   By: Markus Daft M.D.   On: 05/26/2015 13:12   Ct Abdomen Pelvis W Contrast  05/24/2015  CLINICAL DATA:  65 year old male with abdominal pain. Status post colonoscopy. EXAM: CT ABDOMEN AND PELVIS WITH CONTRAST TECHNIQUE: Multidetector CT imaging of the abdomen and pelvis was performed using the standard protocol following bolus administration of intravenous contrast. CONTRAST:  100 cc Omnipaque 300 COMPARISON:  CT dated 12/19/2007 FINDINGS: The visualized lung bases are clear. No intra-abdominal free air or free fluid. The liver, gallbladder, pancreas, spleen, right adrenal gland appear unremarkable. A 1.3 cm indeterminate left adrenal nodule, likely an adenoma. There is moderate right renal atrophy, increased from prior study. There is asymmetric hypo enhancement of the right renal parenchyma compatible with hypoperfusion likely related to vascular compromise. Ultrasound with duplex interrogation of the right renal artery or CT angiography is recommended for better evaluation. There is no hydronephrosis on the right. A 4 mm nonobstructing left renal upper pole calculus. No hydronephrosis. The visualized ureters and urinary bladder appear unremarkable. The prostate and  seminal vesicles are grossly unremarkable. Small hiatal hernia with gastroesophageal reflux. Oral contrast opacifies the stomach and loops of small bowel. There is no evidence of bowel obstruction or inflammation. Appendectomy. Aortoiliac atherosclerotic disease. There is a nonenhancing 3.5 x 3.6 cm (previously 2.5 x 2.7 cm) the structure to the left of the aorta at the level of the left renal artery. There is thin calcification of the wall of this structure. 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 Other etiologies are less likely. Follow-up recommended. There is a 3.3 cm infrarenal abdominal aortic aneurysm (previously 3.0 cm). No portal venous gas identified. There is no adenopathy. The abdominal wall soft tissues appear unremarkable. There is degenerative changes of the spine. No acute fracture. IMPRESSION: Move no pneumoperitoneum or evidence of traumatic bowel injury. Moderate right renal atrophy with hypoperfusion of the right kidney concerning for vascular compromise. Further evaluation of the right kidney as well as interrogation of the right renal artery  with duplex ultrasound or CT angiography recommended. Nonobstructing 4 mm left renal calculus. 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 Follow-up recommended. A 3.3 cm infrarenal abdominal aortic aneurysm. Recommend followup by ultrasound in 3 years. This recommendation follows ACR consensus guidelines: White Paper of the ACR Incidental Findings Committee II on Vascular Findings. J Am Coll Radiol [id number]:[id number] Electronically Signed   By: Anner Crete M.D.   On: 05/24/2015 04:41    CBC  Recent Labs Lab 05/24/15 0112 05/25/15 0619 05/26/15 0618 05/26/15 1657  WBC 13.3* 7.8 6.5 6.8  HGB 14.5 12.9* 13.0 13.4  HCT 41.8 37.2* 38.3* 38.3*  PLT 147* 141* 162 163  MCV 85.1 86.5 85.3 84.4  MCH 29.5 30.0 29.0 29.5  MCHC 34.7 34.7 33.9 35.0  RDW 14.1 14.5 14.1 14.0   LYMPHSABS  --   --   --  2.2  MONOABS  --   --   --  0.6  EOSABS  --   --   --  0.1  BASOSABS  --   --   --  0.0    Chemistries   Recent Labs Lab 05/24/15 0112 05/26/15 1657 05/28/15 0358  NA 136 137 139  K 4.0 3.7 4.2  CL 103 104 103  CO2 25 23 20*  GLUCOSE 132* 111* 103*  BUN 10 13 9   CREATININE 1.38* 1.45* 1.43*  CALCIUM 9.7 9.5 8.9  AST 20  --   --   ALT 12*  --   --   ALKPHOS 85  --   --   BILITOT 1.1  --   --    ------------------------------------------------------------------------------------------------------------------ estimated creatinine clearance is 58.8 mL/min (by C-G formula based on Cr of 1.43). ------------------------------------------------------------------------------------------------------------------ No results for input(s): HGBA1C in the last 72 hours. ------------------------------------------------------------------------------------------------------------------ No results for input(s): CHOL, HDL, LDLCALC, TRIG, CHOLHDL, LDLDIRECT in the last 72 hours. ------------------------------------------------------------------------------------------------------------------ No results for input(s): TSH, T4TOTAL, T3FREE, THYROIDAB in the last 72 hours.  Invalid input(s): FREET3 ------------------------------------------------------------------------------------------------------------------ No results for input(s): VITAMINB12, FOLATE, FERRITIN, TIBC, IRON, RETICCTPCT in the last 72 hours.  Coagulation profile  Recent Labs Lab 05/24/15 0112 05/25/15 0619 05/26/15 0618 05/28/15 0358  INR 1.15 1.35 1.34 1.35    No results for input(s): DDIMER in the last 72 hours.  Cardiac Enzymes No results for input(s): CKMB, TROPONINI, MYOGLOBIN in the last 168 hours.  Invalid input(s): CK ------------------------------------------------------------------------------------------------------------------ Invalid input(s): POCBNP  No results for input(s):  GLUCAP in the last 72 hours.   Merlin Golden M.D. [facility] 05/28/2015, 11:14 AM  Pager: [phone] Between 7am to 7pm - call Pager - [phone]  After 7pm go to [email or web address] - password [email or web address]  Call night coverage person covering after 7pm

## 2015-05-28 NOTE — Progress Notes (Signed)
Pt transferred back to 2w13 after angiogram. Pt and family educated on need for complete bedrest 1:40p. No complaints of pain. Call bell and phone within reach. Will continue to monitor.

## 2015-05-28 NOTE — Consult Note (Addendum)
Cardiologist:  Jacinta Shoe Reason for Consult: Pre-Op Clearance Referring Physician:   Seabron Iannello Quist is an 65 y.o. male.  HPI:   65 y.o. male with a history of CAD, atrial fib, hypertension, Tobacco continued abuse, PVD-Carotid stent 2002, AAA, HLD, testicular cancer.  He is on anticoagulation with coumadin. CHADS VASC Score of 2.  He had a MR Card morphology 06/2010: Normal LV size with mild to moderately decreased systolic function, EF 45%. The inferior wall was akinetic.  Full thickness scar in the inferior wall suggests prior MI with minimal viability.   He underwent cardiac cath on Sep 02, 2010 which revealed 100% occluded RCA, normal left main, 20% prox LAD, diffusely diseased D1 and D2 with multiple 20% lesions, OM1 80% mult lesions, significant left to right collaterals, EF 40-45%.  He underwent successful PCI of the lesion and circumflex marginal vessel using Cypher drug-eluting stent. Concern for thrombus on the apical inferior wall on the long axis 2-chamber view. On the short axis views, this finding was show to potentially be due to trabeculation. Ct angiogram in 2009 revealed stable disease with 80% celiac stenosis,50% right renal artery ,ASCVD with ulceration in the abdominal aorta.   He was last seen by cardiology 11/07/14 and in 2014 before that. He had no complaints at that time.  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  He had an abdominal aortogram this morning which revealed an "occluded right renal artery, high-grade stenosis within the proximal left renal artery, possible source of pseudoaneurysm on left lateral side of aorta just below the renal artery, moderate calcific stenosis and bilateral iliac arteries right greater than left, diseased suprarenal aorta."  Until the onset of abdominal pain Mr. Matters has been doing well. He is very active and is always outside either splitting wood, and on the house  or walking his dogs.  In the summertime he goes kayaking.  He does continue to smoke up to about 5 cigarettes per week.  He has not had any chest pain or chest pressure of any kind.  He also denies nausea, vomiting, fever, shortness of breath, orthopnea, dizziness, PND, cough, congestion, abdominal pain, hematochezia, melena, lower extremity edema, claudication.    Past Medical History  Diagnosis Date  . Arteriosclerotic cardiovascular disease (ASCVD)     AMI in 2000 treated at [facility]; cath in 12/2006->  Chronic total obstruction of the RCA;  drug-eluting stent placed in M1; inferior hypokinesis with an EF of 45%  . Tobacco abuse, in remission     20 pack years; quit in 2009  . Hypertension   . Chronic anticoagulation   . Atrial fibrillation ([place])     on coumadin for couple of years, stopped plavix at that time  . PVD (peripheral vascular disease) ([place])     Ct angiogram in 2009 revealed stable disease with 80% celiac stenosis,50% right renal artery ,ASCVD with ulceration in the abdominal aortashe  . Nephrolithiasis   . Cerebrovascular disease 2002    carotid stent  . Hyperlipidemia   . Myocardial infarction ([place]) 10 yrs ago  . Dysphagia   . Testicular carcinoma ([place]) 1990    right orchiectomy  . AAA (abdominal aortic aneurysm) ([place]) 2017    3.3cm    Past Surgical History  Procedure Laterality Date  . Appendectomy  2004  . Testicular cancer  1990    right orchiectomy  . Colonoscopy  06/17/2011  INCOMPLETE, PREP POOR. Procedure: COLONOSCOPY;  Surgeon: Daneil Dolin, MD;  Location: [facility];  Service: Endoscopy;  Laterality: N/A;  10:00  . Esophagogastroduodenoscopy  06/17/2011    severe erosive/ulcerative reflux esophagitis, soft noncritical stricture dilatied, small hh, antral erosion   . Colonoscopy  07/15/2011    [id number] rectal and colon polyps  . Colonoscopy N/A 05/22/2015    Procedure: COLONOSCOPY;  Surgeon: Daneil Dolin, MD;  Location: [facility];  Service:  Endoscopy;  Laterality: N/A;  1300 - moved to 2:30 - office to notify  . Esophagogastroduodenoscopy N/A 05/22/2015    Procedure: ESOPHAGOGASTRODUODENOSCOPY (EGD);  Surgeon: Daneil Dolin, MD;  Location: [facility];  Service: Endoscopy;  Laterality: N/A;  . Esophageal dilation N/A 05/22/2015    Procedure: ESOPHAGEAL DILATION;  Surgeon: Daneil Dolin, MD;  Location: [facility];  Service: Endoscopy;  Laterality: N/A;    Family History  Problem Relation Age of Onset  . Colon cancer Father 31    deceased  . Prostate cancer Father   . Liver disease Neg Hx   . Anesthesia problems Neg Hx   . Hypotension Neg Hx   . Malignant hyperthermia Neg Hx   . Pseudochol deficiency Neg Hx     Social History:  reports that he has been smoking Cigarettes.  He has a 4 pack-year smoking history. He has never used smokeless tobacco. He reports that he does not drink alcohol or use illicit drugs.  Allergies: No Known Allergies  Medications: Scheduled Meds: . amLODipine  10 mg Oral Daily  . atorvastatin  20 mg Oral QHS  . [START ON 05/29/2015] docusate sodium  100 mg Oral Daily  . sodium chloride  3 mL Intravenous Q12H   Continuous Infusions: . sodium chloride 100 mL/hr at 05/27/15 2042  . sodium chloride 50 mL/hr at 05/28/15 0918   PRN Meds:.sodium chloride, acetaminophen **OR** acetaminophen, albuterol, alum & mag hydroxide-simeth, diphenhydrAMINE, guaiFENesin-dextromethorphan, hydrALAZINE, iohexol, labetalol, metoprolol, ondansetron, phenol, polyethylene glycol, sodium chloride, zolpidem   Results for orders placed or performed during the hospital encounter of 05/24/15 (from the past 48 hour(s))  Basic metabolic panel     Status: Abnormal   Collection Time: 05/26/15  4:57 PM  Result Value Ref Range   Sodium 137 135 - 145 mmol/L   Potassium 3.7 3.5 - 5.1 mmol/L   Chloride 104 101 - 111 mmol/L   CO2 23 22 - 32 mmol/L   Glucose, Bld 111 (H) 65 - 99 mg/dL   BUN 13 6 - 20 mg/dL   Creatinine,  Ser 1.45 (H) 0.61 - 1.24 mg/dL   Calcium 9.5 8.9 - 10.3 mg/dL   GFR calc non Af Amer 50 (L) >60 mL/min   GFR calc Af Amer 58 (L) >60 mL/min    Comment: (NOTE) The eGFR has been calculated using the CKD EPI equation. This calculation has not been validated in all clinical situations. eGFR's persistently <60 mL/min signify possible Chronic Kidney Disease.    Anion gap 10 5 - 15  CBC with Differential/Platelet     Status: Abnormal   Collection Time: 05/26/15  4:57 PM  Result Value Ref Range   WBC 6.8 4.0 - 10.5 K/uL   RBC 4.54 4.22 - 5.81 MIL/uL   Hemoglobin 13.4 13.0 - 17.0 g/dL   HCT 38.3 (L) 39.0 - 52.0 %   MCV 84.4 78.0 - 100.0 fL   MCH 29.5 26.0 - 34.0 pg   MCHC 35.0 30.0 - 36.0 g/dL  RDW 14.0 11.5 - 15.5 %   Platelets 163 150 - 400 K/uL   Neutrophils Relative % 58 %   Neutro Abs 3.9 1.7 - 7.7 K/uL   Lymphocytes Relative 32 %   Lymphs Abs 2.2 0.7 - 4.0 K/uL   Monocytes Relative 9 %   Monocytes Absolute 0.6 0.1 - 1.0 K/uL   Eosinophils Relative 1 %   Eosinophils Absolute 0.1 0.0 - 0.7 K/uL   Basophils Relative 0 %   Basophils Absolute 0.0 0.0 - 0.1 K/uL  Protime-INR     Status: Abnormal   Collection Time: 05/28/15  3:58 AM  Result Value Ref Range   Prothrombin Time 16.8 (H) 11.6 - 15.2 seconds   INR 1.35 0.00 - 2.95  Basic metabolic panel     Status: Abnormal   Collection Time: 05/28/15  3:58 AM  Result Value Ref Range   Sodium 139 135 - 145 mmol/L   Potassium 4.2 3.5 - 5.1 mmol/L   Chloride 103 101 - 111 mmol/L   CO2 20 (L) 22 - 32 mmol/L   Glucose, Bld 103 (H) 65 - 99 mg/dL   BUN 9 6 - 20 mg/dL   Creatinine, Ser 1.43 (H) 0.61 - 1.24 mg/dL   Calcium 8.9 8.9 - 10.3 mg/dL   GFR calc non Af Amer 51 (L) >60 mL/min   GFR calc Af Amer 59 (L) >60 mL/min    Comment: (NOTE) The eGFR has been calculated using the CKD EPI equation. This calculation has not been validated in all clinical situations. eGFR's persistently <60 mL/min signify possible Chronic  Kidney Disease.    Anion gap 16 (H) 5 - 15    Ct Angio Abdomen W/cm &/or Wo Contrast  05/26/2015  CLINICAL DATA:  Right flank pain since colonoscopy on 05/22/2015. Evaluate aortic lesion. EXAM: CT ANGIOGRAPHY ABDOMEN TECHNIQUE: Multidetector CT imaging of the abdomen was performed using the standard protocol during bolus administration of intravenous contrast. Multiplanar reconstructed images including MIPs were obtained and reviewed to evaluate the vascular anatomy. CONTRAST:  161m OMNIPAQUE IOHEXOL 350 MG/ML SOLN COMPARISON:  05/24/2015 and 12/19/2007 FINDINGS: ARTERIAL FINDINGS: Aorta: Diffuse atherosclerotic disease in the abdominal aorta with an exophytic saccular low-density structure along the left infrarenal aspect. This structure has peripheral calcifications and no significant arterial flow within it. The saccular exophytic portion measures 4.1 x 3.8 x 3.8 cm. Overall size of the aorta with the aneurysm measures up to 6.0 cm. Exophytic portion measured 3.2 x 2.5 x 2.0 cm in 2009. The distal abdominal aorta near the IMA takeoff measures up to 3.2 cm in the AP dimension, measured 2.9 cm in 2009. Irregularity along the posterior aspect of the abdominal aorta on sequence 5, image 47 may represent the underlying arterial lesion or defect in the aorta. Celiac axis: Again noted is moderate to severe stenosis at the origin of the celiac trunk. Prominent pancreatic-duodenal branches and suspect retrograde flow in this area related to the stenosis in the celiac trunk. Superior mesenteric: SMA is patent with mild atherosclerotic disease. Left renal: There appears to be high-grade stenosis involving the proximal left renal artery which may be related to compression from the adjacent aortic lesion. Right renal: Near occlusion of the proximal right renal artery and there is delayed enhancement of the right kidney compared to the left kidney. Inferior mesenteric:  Patent Left iliac: Atherosclerotic plaque in the  proximal left common iliac artery with at least mild stenosis. Right iliac: Atherosclerotic plaque in the proximal right iliac  arteries. At least mild stenosis at the origin of the right internal iliac artery. Venous findings: IVC and renal veins are patent. Portal venous system is patent. Review of the MIP images confirms the above findings. Nonvascular findings: Mild dependent atelectasis or scarring at the lung bases. No pleural effusions. Chronic fullness of the left adrenal gland is suggestive for hyperplasia. No gross abnormality to the liver or gallbladder. No acute abnormality to the pancreas or spleen. No acute abnormality to the left kidney. Right kidney is atrophic compared to the left kidney with delayed contrast enhancement. No significant free fluid or lymphadenopathy in the abdomen. A large amount of fluid and debris in the stomach. No gross abnormality to the visualized small or large bowel. No acute bone abnormality. IMPRESSION: Enlarging saccular lesion along the left side of the infrarenal abdominal aorta. 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 This lesion is primarily thrombosed but there is a small area of irregularity along the posterior aspect of the abdominal aorta as described. Recommend vascular surgery consultation. Critical stenosis or near occlusion of the right renal artery with an atrophic right kidney and delayed contrast enhancement in the right kidney. In addition, there appears to be significant stenosis involving the proximal left renal artery which may represent a combination of atherosclerotic disease and compression from the left aortic saccular lesion. 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 Electronically Signed   By: Markus Daft M.D.   On: 05/26/2015 13:12    Review of Systems  All other systems reviewed and are negative.  Blood pressure 183/89, pulse 52, temperature 98  F (36.7 C), temperature source Oral, resp. rate 25, height 6' (1.829 m), weight 189 lb (85.73 kg), SpO2 99 %. Physical Exam  Nursing note and vitals reviewed. Constitutional: He is oriented to person, place, and time. He appears well-developed and well-nourished. No distress.  HENT:  Head: Normocephalic and atraumatic.  Mouth/Throat: No oropharyngeal exudate.  Eyes: EOM are normal. Pupils are equal, round, and reactive to light. No scleral icterus.  Neck: Normal range of motion. Neck supple.  Cardiovascular: S1 normal and S2 normal.  An irregularly irregular rhythm present. Tachycardia present.   No murmur heard. Pulses:      Radial pulses are 2+ on the right side, and 2+ on the left side.       Dorsalis pedis pulses are 2+ on the right side, and 2+ on the left side.  Respiratory: Effort normal and breath sounds normal. He has no wheezes. He has no rales.  GI: Bowel sounds are normal. He exhibits no distension.  Musculoskeletal: He exhibits no edema.  Neurological: He is alert and oriented to person, place, and time. He exhibits normal muscle tone.  Skin: Skin is warm and dry.  Psychiatric: He has a normal mood and affect.    Assessment/Plan: Active Problems:   Hyperlipidemia   Hypertension   Atrial fibrillation (HCC)   PERIPHERAL VASCULAR DISEASE   Tobacco abuse   Chronic anticoagulation   Chronic kidney disease   Abdominal pain   Coronary artery disease due to lipid rich plaque  65 y.o. male with a history of CAD, atrial fib, hypertension, Tobacco abuse, PVD-Carotid stent 2002, AAA, HLD, testicular cancer who presented with abdominal pain following colonoscopy last week.  He had a cardiac cath on Sep 02, 2010 which revealed 100% occluded RCA, normal left main, 20% prox LAD, diffusely diseased D1 and D2  with multiple 20% lesions, OM1 80% mult lesions, significant left to right collaterals, EF 40-45%.  He underwent successful PCI of the lesion and circumflex marginal vessel.   He  has an occluded right renal artery and high-grade left renal artery stenosis, AA-infrarenal pseudoaneurysm.   He is currently in atrial fibrillation. On telemetry.  Given the fact that he continues to smoke, and has had progression of peripheral vascular disease, he certainly could have progression of coronary disease as well. I do not think subjecting him to a nuclear stress test is a good idea with a ruptured AA and pseudoaneurysm.  No subjective or objective indications of coronary ischemia at this time.  However, given his history, and ongoing tobacco abuse, I think he will be high risk for surgery. He had a random troponin drawn in December during an emergency room visit which was negative.  EKG is pending.  I ordered a 2-D echocardiogram.  Md's opinion to follow.  Tarri Fuller, [facility] 05/28/2015, 11:40 AM    The patient was seen, examined and discussed with Tarri Fuller, PA-C and I agree with the above.   This is a very pleasant 65 year old male with a very complex history of coronary artery disease last cardiac cath in 2012 that showed 100% occluded RCA with collaterals to the RCA, and 80% stenosis in OM1, status post PCI, with overall LVEF 40-45%. Patient also has known chronic persistent atrial fibrillation on chronic Coumadin therapy. The patient also has known occluded right renal artery and high-grade stenosis in his left renal artery with associated hypertension. Patient has known AAA with infrarenal aneurysm, he presented on January 21 with abdominal pain, aortogram today revealed occluded right renal artery,  possible source of pseudoaneurysm on left lateral side of aorta just below the renal artery, moderate calcific stenosis andbilateral iliac arteries right greater than left. Vascular surgery consulted [place] for preop evaluation. Prior to this admission the patient was very active and he continues working in his backyard, in [employer] injury sports such as kayaking and he denies any shortness of  breath or chest pain. He also denies lower extremity edema palpitations, orthopnea or proximal nocturnal dyspnea. He is currently no symptoms of angina or heart failure. We will repeat echocardiogram and unless there is a change in his left ventricular ejection fraction we would recommend for him to undergo surgery. Continue using atorvastatin. Considering his comorbidities and his considered a moderate risk for a high risk vascular surgery. He is currently hypertensive and his heart rate close to 100, we'll start him on metoprolol 25 mg by mouth twice a day.  Dorothy Spark 05/28/2015

## 2015-05-28 NOTE — H&P (View-Only) (Signed)
Pt seen and examined.  Currently pain free.  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 Difficult to determine if this is subacute or chronic.  Needs consideration for repair. Will do arteriogram tomorrow to further define anatomy.  He will need cardiac risk stratification as he currently does get chest pain requiring nitroglycerin while sitting watching tv occasionally.  He has previously seen [facility]  NPO p midnight Consent Angio by my partner Dr Trula Slade tomorrow  Ruta Hinds, MD [facility] Office: [phone] Pager: [phone]

## 2015-05-29 ENCOUNTER — Encounter (HOSPITAL_COMMUNITY): Payer: Self-pay | Admitting: Surgery

## 2015-05-29 ENCOUNTER — Inpatient Hospital Stay (HOSPITAL_COMMUNITY): Payer: Medicare Other

## 2015-05-29 DIAGNOSIS — R109 Unspecified abdominal pain: Secondary | ICD-10-CM | POA: Diagnosis not present

## 2015-05-29 DIAGNOSIS — I481 Persistent atrial fibrillation: Secondary | ICD-10-CM | POA: Diagnosis not present

## 2015-05-29 DIAGNOSIS — N182 Chronic kidney disease, stage 2 (mild): Secondary | ICD-10-CM | POA: Diagnosis not present

## 2015-05-29 DIAGNOSIS — Z7901 Long term (current) use of anticoagulants: Secondary | ICD-10-CM | POA: Diagnosis not present

## 2015-05-29 LAB — BASIC METABOLIC PANEL
Anion gap: 8 (ref 5–15)
BUN: 11 mg/dL (ref 6–20)
CO2: 24 mmol/L (ref 22–32)
Calcium: 9.6 mg/dL (ref 8.9–10.3)
Chloride: 104 mmol/L (ref 101–111)
Creatinine, Ser: 1.4 mg/dL — ABNORMAL HIGH (ref 0.61–1.24)
GFR calc Af Amer: >60 mL/min (ref >60)
GFR calc non Af Amer: 52 mL/min — ABNORMAL LOW (ref >60)
Glucose, Bld: 143 mg/dL — ABNORMAL HIGH (ref 65–99)
Potassium: 3.9 mmol/L (ref 3.5–5.1)
Sodium: 136 mmol/L (ref 135–145)

## 2015-05-29 LAB — PROTIME-INR
INR: 1.18 (ref 0.00–1.49)
Prothrombin Time: 15.2 seconds (ref 11.6–15.2)

## 2015-05-29 MED ORDER — HYDROCORTISONE 0.5 % EX CREA
TOPICAL_CREAM | Freq: Three times a day (TID) | CUTANEOUS | Status: DC
  Administered 2015-05-29: 11:00:00 via TOPICAL
  Filled 2015-05-29: qty 28.35

## 2015-05-29 MED ORDER — METOPROLOL TARTRATE 25 MG PO TABS: 25.0000 mg | ORAL_TABLET | Freq: Two times a day (BID) | ORAL | Status: DC

## 2015-05-29 NOTE — Care Management Note (Signed)
Case Management Note  Patient Details  Name: [name] [name] MRN: [id number] Date of Birth: 13-Jan-1951  Subjective/Objective:    [facility] admitted from [facility] for vascular assessment for pseudoaneurysm                 Action/Plan:  Pt is independent from home.  CM assessed pt prior to discharge no CM needs identified   Expected Discharge Date:                  Expected Discharge Plan:  Home/Self Care  In-House Referral:     Discharge planning Services  CM Consult  Post Acute Care Choice:    Choice offered to:     DME Arranged:    DME Agency:     HH Arranged:    [place] Agency:     Status of Service:  Completed, signed off  Medicare Important Message Given:  Yes Date Medicare IM Given:    Medicare IM give by:    Date Additional Medicare IM Given:    Additional Medicare Important Message give by:     If discussed at [place] of Stay Meetings, dates discussed:    Additional Comments:  Maryclare Labrador, RN 05/29/2015, 2:04 PM

## 2015-05-29 NOTE — Progress Notes (Signed)
Pt. Discharged to home with wife  Pt. D/C'd via wheelchair with volunteers Discharge information reviewed and given All personal belongings given to Pt.  Education discussed IV was d/c and intact upon removal Tele d/c

## 2015-05-29 NOTE — Clinical Documentation Improvement (Signed)
Hospitalist  Can the diagnosis of CKD be further specified?   CKD Stage I - GFR greater than or equal to 90  CKD Stage II - GFR 60-89  CKD Stage III - GFR 30-59  CKD Stage IV - GFR 15-29  CKD Stage V - GFR < 15  ESRD (End Stage Renal Disease)  Other condition  Unable to clinically determine   Supporting Information: : (risk factors, signs and symptoms, diagnostics, treatment) CKD per 01/25 progress notes.  Labs:     Bun     Creat      GFR:  01/26:     11        1.40        52 01/25:      9         1.43        51 01/21:     10        1.38        53       Please exercise your independent, professional judgment when responding. A specific answer is not anticipated or expected.   Thank You, [place] [phone]

## 2015-05-29 NOTE — Discharge Summary (Signed)
Physician Discharge Summary  [name] [name] [id number] DOB: [birth year] DOA: 05/24/2015  PCP: Vic Blackbird, MD  Admit date: 05/24/2015 Discharge date: 05/29/2015  Time spent: 40 minutes  Recommendations for Outpatient Follow-up:  1. Follow-up with primary care physician within one week. 2. Stop Coumadin on February 1 in preparation for vascular surgery on Monday February 6.   Discharge Diagnoses:  Active Problems:   Hyperlipidemia   Hypertension   Atrial fibrillation (HCC)   PERIPHERAL VASCULAR DISEASE   Tobacco abuse   Chronic anticoagulation   Chronic kidney disease   Abdominal pain   Coronary artery disease due to lipid rich plaque   Discharge Condition: Stable  Diet recommendation: Heart healthy  Filed Weights   05/24/15 0035  Weight: 85.73 kg (189 lb)    History of present illness:  65 year old male presented to ED on 05/23/15 for abdominal pain. Patient has a recent history of colonoscopy on 05/22/15 which he states was a 3 year follow up for colonic polyps. He was taken off Coumadin, which he is on chronically for atrial fibrillation, for the colonoscopy. He mentions he has been on coumadin for 4 years for stents placed in his heart. He voiced that his colonoscopy went without problem and he did eat a sandwich after the procedure and shortly after eating he developed significant abdominal pain. He has not eaten since that time due to pain. He has taken his medications though. He describes the pain as sharp located just beneath his ribs on his right side as well as in the groin region on his right side. The pain does not radiate but he does mention his entire lower abdomen feels sore from the pain in his right groin. He denies any emesis. Nothing at home made the pain any better and all movement and palpation makes it worse. Denies fevers at home, denies hematochezia and hematuria. Denies any shortness of breath and chest pain.  Hospital Course:   Abdominal  pain: Currently resolved -CT abdomen showed evidence of hypoperfusion to the right kidney on CT scan -CT angiogram revealed some enlargement of the seat of an aneurysm -Vascular surgery consulted, patient seen by Dr. Kellie Simmering, did not feel his symptoms were secondary to aortic pathology. Dr. Oneida Alar will evaluate to determine plans for follow-up on repair. - Per vascular surgery, abdominal aortogram with lower extremity done on 05/28/2015. Please see report below. -Perivascular scheduled for open repair of infrarenal aorta with the left renal artery bypass and possible right renal artery bypass on February 6. -Discussed with the patient to stop the Coumadin on February 1  Surgical cardiac risk stratification - Also recommended cardiac clearance, discussed with cardiology, consult called, patient followed with Dr. Lattie Haw in [place], has CAD with MI in 2000, had DES laced in M1, chronic total obstruction of RCA. -Cardiology recommended to start metoprolol 25 mg, prescribed. -Stop Coumadin on February 1.  Hypertension -Continue amlodipine  Hyperlipidemia Continue statin  Acute kidney injury - Creatinine stable, hold lisinopril, continue gentle hydration  Procedures:  Abdominal aortogram with lower extremity runoff, done by Dr. 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 HEENT: anicteric sclera, pupils reactive to light and accommodation, EOMI CVS: S1-S2 clear, no murmur rubs or  gallops Chest: clear to auscultation bilaterally, no wheezing, rales or rhonchi Abdomen: soft nontender, nondistended, normal bowel sounds, no organomegaly Extremities: no cyanosis, clubbing or edema noted bilaterally Neuro: Cranial nerves II-XII intact, no focal neurological deficits  Discharge Instructions   Discharge Instructions    Diet - low sodium heart healthy    Complete by:  As directed      Increase activity slowly    Complete by:  As directed           Current Discharge Medication List    CONTINUE these medications which have NOT CHANGED   Details  albuterol (PROVENTIL HFA;VENTOLIN HFA) 108 (90 BASE) MCG/ACT inhaler Inhale 2-4 puffs into the lungs every 4 (four) hours as needed for wheezing or shortness of breath. Qty: 1 Inhaler, Refills: 0    amLODipine (NORVASC) 10 MG tablet take 1 tablet by mouth once daily Qty: 90 tablet, Refills: 3    atorvastatin (LIPITOR) 20 MG tablet Take 1 tablet (20 mg total) by mouth at bedtime. Qty: 30 tablet, Refills: 3    cloNIDine (CATAPRES) 0.1 MG tablet Take 1 tablet (0.1 mg total) by mouth 2 (two) times daily. Qty: 180 tablet, Refills: 3    diphenhydrAMINE (BENADRYL) 25 MG tablet Take 25 mg by mouth daily as needed for allergies.    fexofenadine (ALLEGRA) 180 MG tablet Take 1 tablet (180 mg total) by mouth daily. Qty: 30 tablet, Refills: 2    lisinopril (PRINIVIL,ZESTRIL) 40 MG tablet Take 1 tablet (40 mg total) by mouth daily. Qty: 90 tablet, Refills: 3    !! Multiple Vitamin (MULITIVITAMIN WITH MINERALS) TABS Take 1 tablet by mouth daily.    !! Multiple Vitamin (ONE-A-DAY MENS PO) Take 1 tablet by mouth daily.    nitroGLYCERIN (NITROSTAT) 0.4 MG SL tablet place 1 tablet under the tongue if needed every 5 minutes for chest pain for 3 doses IF NO RELIEF AFTER 3RD DOSE CALL PRESCRIBER OR 911. Qty: 25 tablet, Refills: 2    Omega-3 Fatty Acids (FISH OIL) 1000 MG CAPS Take 1,000 mg by mouth daily.  Refills: 0    pantoprazole  (PROTONIX) 40 MG tablet Take 40 mg by mouth daily.    traZODone (DESYREL) 50 MG tablet Take 0.5-1 tablets (25-50 mg total) by mouth at bedtime as needed for sleep. Qty: 30 tablet, Refills: 3    warfarin (COUMADIN) 5 MG tablet Take 1 1/2 tablets daily Qty: 45 tablet, Refills: 6    polyethylene glycol-electrolytes (NULYTELY/GOLYTELY) 420 g solution Take 4,000 mLs by mouth once. Qty: 4000 mL, Refills: 0     !! - Potential duplicate medications found. Please discuss with provider.     No Known Allergies    The results of significant diagnostics from this hospitalization (including imaging, microbiology, ancillary and laboratory) are listed below for reference.    Significant Diagnostic Studies: Dg Chest 2 View  05/24/2015  CLINICAL DATA:  65 year old male with nausea and vomiting and sharp right lower quadrant abdominal pain EXAM: CHEST  2 VIEW COMPARISON:  Radiograph dated 04/09/2015 FINDINGS: The heart size and mediastinal contours are within normal limits. Both lungs are clear. The visualized skeletal structures are unremarkable. IMPRESSION: No active cardiopulmonary disease. Electronically Signed   By: Anner Crete M.D.   On: 05/24/2015 04:45   Ct Angio Abdomen W/cm &/or Wo Contrast  05/26/2015  CLINICAL DATA:  Right flank pain since colonoscopy on 05/22/2015. Evaluate aortic lesion. EXAM: CT ANGIOGRAPHY ABDOMEN TECHNIQUE: Multidetector CT imaging of the abdomen was performed using the standard protocol during bolus administration of intravenous contrast. Multiplanar reconstructed images including MIPs were obtained and reviewed to evaluate the vascular anatomy. CONTRAST:  141mL OMNIPAQUE IOHEXOL 350 MG/ML SOLN COMPARISON:  05/24/2015 and 12/19/2007 FINDINGS: ARTERIAL FINDINGS: Aorta: Diffuse atherosclerotic disease in the abdominal aorta with an exophytic saccular low-density structure along the left infrarenal aspect. This structure has peripheral calcifications and no significant  arterial flow within it. The saccular exophytic portion measures 4.1 x 3.8 x 3.8 cm. Overall size of the aorta with the aneurysm measures up to 6.0 cm. Exophytic portion measured 3.2 x 2.5 x 2.0 cm in 2009. The distal abdominal aorta near the IMA takeoff measures up to 3.2 cm in the AP dimension, measured 2.9 cm in 2009. Irregularity along the posterior aspect of the abdominal aorta on sequence 5, image 47 may represent the underlying arterial lesion or defect in the aorta. Celiac axis: Again noted is moderate to severe stenosis at the origin of the celiac trunk. Prominent pancreatic-duodenal branches and suspect retrograde flow in this area related to the stenosis in the celiac trunk. Superior mesenteric: SMA is patent with mild atherosclerotic disease. Left renal: There appears to be high-grade stenosis involving the proximal left renal artery which may be related to compression from the adjacent aortic lesion. Right renal: Near occlusion of the proximal right renal artery and there is delayed enhancement of the right kidney compared to the left kidney. Inferior mesenteric:  Patent Left iliac: Atherosclerotic plaque in the proximal left common iliac artery with at least mild stenosis. Right iliac: Atherosclerotic plaque in the proximal right iliac arteries. At least mild stenosis at the origin of the right internal iliac artery. Venous findings: IVC and renal veins are patent. Portal venous system is patent. Review of the MIP images confirms the above findings. Nonvascular findings: Mild dependent atelectasis or scarring at the lung bases. No pleural effusions. Chronic fullness of the left adrenal gland is suggestive for hyperplasia. No gross abnormality to the liver or gallbladder. No acute abnormality to the pancreas or spleen. No acute abnormality to the left kidney. Right kidney is atrophic compared to the left kidney with delayed contrast enhancement. No significant free fluid or lymphadenopathy in the  abdomen. A large amount of fluid and debris in the stomach. No gross abnormality to the visualized small or large bowel. No acute bone abnormality. IMPRESSION: Enlarging saccular lesion along the left side of the infrarenal abdominal aorta. 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 This lesion is primarily thrombosed but there is a small area of irregularity along the posterior aspect of the abdominal aorta as described. Recommend vascular surgery consultation. Critical stenosis or near occlusion of the right renal artery with an atrophic right kidney and delayed contrast enhancement in the right kidney. In addition, there appears to be significant stenosis involving the proximal left renal artery which may represent a combination of atherosclerotic disease and compression from the left aortic saccular lesion. 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 Electronically Signed   By: Markus Daft M.D.   On: 05/26/2015 13:12   Ct Abdomen Pelvis W Contrast  05/24/2015  CLINICAL DATA:  65 year old male with abdominal pain. Status post colonoscopy. EXAM: CT ABDOMEN AND PELVIS WITH CONTRAST TECHNIQUE: Multidetector CT  imaging of the abdomen and pelvis was performed using the standard protocol following bolus administration of intravenous contrast. CONTRAST:  100 cc Omnipaque 300 COMPARISON:  CT dated 12/19/2007 FINDINGS: The visualized lung bases are clear. No intra-abdominal free air or free fluid. The liver, gallbladder, pancreas, spleen, right adrenal gland appear unremarkable. A 1.3 cm indeterminate left adrenal nodule, likely an adenoma. There is moderate right renal atrophy, increased from prior study. There is asymmetric hypo enhancement of the right renal parenchyma compatible with hypoperfusion likely related to vascular compromise. Ultrasound with duplex interrogation of the right renal artery or CT angiography is  recommended for better evaluation. There is no hydronephrosis on the right. A 4 mm nonobstructing left renal upper pole calculus. No hydronephrosis. The visualized ureters and urinary bladder appear unremarkable. The prostate and seminal vesicles are grossly unremarkable. Small hiatal hernia with gastroesophageal reflux. Oral contrast opacifies the stomach and loops of small bowel. There is no evidence of bowel obstruction or inflammation. Appendectomy. Aortoiliac atherosclerotic disease. There is a nonenhancing 3.5 x 3.6 cm (previously 2.5 x 2.7 cm) the structure to the left of the aorta at the level of the left renal artery. There is thin calcification of the wall of this structure. 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 Other etiologies are less likely. Follow-up recommended. There is a 3.3 cm infrarenal abdominal aortic aneurysm (previously 3.0 cm). No portal venous gas identified. There is no adenopathy. The abdominal wall soft tissues appear unremarkable. There is degenerative changes of the spine. No acute fracture. IMPRESSION: Move no pneumoperitoneum or evidence of traumatic bowel injury. Moderate right renal atrophy with hypoperfusion of the right kidney concerning for vascular compromise. Further evaluation of the right kidney as well as interrogation of the right renal artery with duplex ultrasound or CT angiography recommended. Nonobstructing 4 mm left renal calculus. 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 Follow-up recommended. A 3.3 cm infrarenal abdominal aortic aneurysm. Recommend followup by ultrasound in 3 years. This recommendation follows ACR consensus guidelines: White Paper of the ACR Incidental Findings Committee II on Vascular Findings. J Am Coll Radiol 2013CJ:[id number] Electronically Signed   By: Anner Crete M.D.   On: 05/24/2015 04:41    Microbiology: No results found for this or any previous visit (from the  past 240 hour(s)).   Labs: Basic Metabolic Panel:  Recent Labs Lab 05/24/15 0112 05/26/15 1657 05/28/15 0358 05/29/15 0834  NA 136 137 139 136  K 4.0 3.7 4.2 3.9  CL 103 104 103 104  CO2 25 23 20* 24  GLUCOSE 132* 111* 103* 143*  BUN 10 13 9 11   CREATININE 1.38* 1.45* 1.43* 1.40*  CALCIUM 9.7 9.5 8.9 9.6   Liver Function Tests:  Recent Labs Lab 05/24/15 0112  AST 20  ALT 12*  ALKPHOS 85  BILITOT 1.1  PROT 7.0  ALBUMIN 3.5    Recent Labs Lab 05/24/15 0112  LIPASE 48   No results for input(s): AMMONIA in the last 168 hours. CBC:  Recent Labs Lab 05/24/15 0112 05/25/15 0619 05/26/15 0618 05/26/15 1657  WBC 13.3* 7.8 6.5 6.8  NEUTROABS  --   --   --  3.9  HGB 14.5 12.9* 13.0 13.4  HCT 41.8 37.2* 38.3* 38.3*  MCV 85.1 86.5 85.3 84.4  PLT 147* 141* 162 163   Cardiac Enzymes: No results for input(s): CKTOTAL, CKMB, CKMBINDEX, TROPONINI in the last 168 hours. BNP: BNP (last 3 results) No results for  input(s): BNP in the last 8760 hours.  ProBNP (last 3 results) No results for input(s): PROBNP in the last 8760 hours.  CBG: No results for input(s): GLUCAP in the last 168 hours.     Signed:  Birdie Hopes MD.  [facility] 05/29/2015, 12:07 PM

## 2015-06-02 ENCOUNTER — Other Ambulatory Visit: Payer: Self-pay

## 2015-06-05 ENCOUNTER — Encounter (HOSPITAL_COMMUNITY)
Admission: RE | Admit: 2015-06-05 | Discharge: 2015-06-05 | Disposition: A | Discharge status: Home / Self Care | Payer: Medicare Other | Source: Ambulatory Visit | Attending: Vascular Surgery | Admitting: Vascular Surgery

## 2015-06-05 ENCOUNTER — Encounter (HOSPITAL_COMMUNITY): Payer: Self-pay

## 2015-06-05 DIAGNOSIS — I714 Abdominal aortic aneurysm, without rupture: Secondary | ICD-10-CM | POA: Diagnosis not present

## 2015-06-05 DIAGNOSIS — Z01812 Encounter for preprocedural laboratory examination: Secondary | ICD-10-CM | POA: Diagnosis present

## 2015-06-05 DIAGNOSIS — Z0183 Encounter for blood typing: Secondary | ICD-10-CM | POA: Insufficient documentation

## 2015-06-05 HISTORY — DX: Gastro-esophageal reflux disease without esophagitis: K21.9

## 2015-06-05 LAB — SURGICAL PCR SCREEN
MRSA, PCR: NEGATIVE
Staphylococcus aureus: NEGATIVE

## 2015-06-05 LAB — COMPREHENSIVE METABOLIC PANEL
ALT: 23 U/L (ref 17–63)
AST: 19 U/L (ref 15–41)
Albumin: 3.6 g/dL (ref 3.5–5.0)
Alkaline Phosphatase: 85 U/L (ref 38–126)
Anion gap: 14 (ref 5–15)
BUN: 12 mg/dL (ref 6–20)
CO2: 22 mmol/L (ref 22–32)
Calcium: 9.9 mg/dL (ref 8.9–10.3)
Chloride: 104 mmol/L (ref 101–111)
Creatinine, Ser: 1.27 mg/dL — ABNORMAL HIGH (ref 0.61–1.24)
GFR calc Af Amer: >60 mL/min (ref >60)
GFR calc non Af Amer: 59 mL/min — ABNORMAL LOW (ref >60)
Glucose, Bld: 81 mg/dL (ref 65–99)
Potassium: 4.2 mmol/L (ref 3.5–5.1)
Sodium: 140 mmol/L (ref 135–145)
Total Bilirubin: 0.3 mg/dL (ref 0.3–1.2)
Total Protein: 7.4 g/dL (ref 6.5–8.1)

## 2015-06-05 LAB — CBC
HCT: 40.9 % (ref 39.0–52.0)
Hemoglobin: 14 g/dL (ref 13.0–17.0)
MCH: 29.2 pg (ref 26.0–34.0)
MCHC: 34.2 g/dL (ref 30.0–36.0)
MCV: 85.2 fL (ref 78.0–100.0)
Platelets: 292 10*3/uL (ref 150–400)
RBC: 4.8 MIL/uL (ref 4.22–5.81)
RDW: 13.8 % (ref 11.5–15.5)
WBC: 8.2 10*3/uL (ref 4.0–10.5)

## 2015-06-05 LAB — URINALYSIS, ROUTINE W REFLEX MICROSCOPIC
Bilirubin Urine: NEGATIVE
Glucose, UA: NEGATIVE mg/dL
Hgb urine dipstick: NEGATIVE
Ketones, ur: NEGATIVE mg/dL
Leukocytes, UA: NEGATIVE
Nitrite: NEGATIVE
Protein, ur: NEGATIVE mg/dL
Specific Gravity, Urine: 1.015 (ref 1.005–1.030)
pH: 6 (ref 5.0–8.0)

## 2015-06-05 LAB — ABO/RH: ABO/RH(D): B NEG

## 2015-06-05 NOTE — Pre-Procedure Instructions (Signed)
[name] [name]  06/05/2015      [facility] - [place], [place] [address] Phone: [phone] Fax: [phone]  [facility] [address], [place] [address] [place] [address] Phone: [phone] Fax: [phone]    Your procedure is scheduled on Monday, February 6th   Report to [facility] Admitting at 5:30 AM             (Posted Surgery Time is 7:30 am - 3:00 pm)   Call this number if you have problems the morning of surgery:  [phone]   Remember:  Do not eat food or drink liquids after midnight Sunday.  Take these medicines the morning of surgery with A SIP OF WATER : Norvasc, Metoprolol, Protonix.  Please use the albuterol that morning.   Do not wear jewelry - no rings or watches.  Do not wear lotions or colognes.  You may NOT wear deodorant the day of surgery.              Men may shave face and neck.  Do not bring valuables to the hospital.  [facility] is not responsible for any belongings or valuables.  Contacts, dentures or bridgework may not be worn into surgery.  Leave your suitcase in the car.  After surgery it may be brought to your room.  For patients admitted to the hospital, discharge time will be determined by your treatment team.  Name and phone number of your driver:     Please read over the following fact sheets that you were given. Pain Booklet, Coughing and Deep Breathing, Blood Transfusion Information, MRSA Information and Surgical Site Infection Prevention

## 2015-06-05 NOTE — Progress Notes (Addendum)
PCP is Dr. Kevan Rosebush Cardio is Bunnie Domino, NP up in [facility].   He had been seeing Dr. Lattie Haw. Per records, he's had heart cath in 2008. Denies any heart issues lately.   He has stopped his coumadin 06/04/2015 Not enough blood placed in blue top tubes.   Needs to be drawn, along with blood gas, DOS.  Order placed.

## 2015-06-05 NOTE — Progress Notes (Signed)
   06/05/15 1515  OBSTRUCTIVE SLEEP APNEA  Have you ever been diagnosed with sleep apnea through a sleep study? No  Do you snore loudly (loud enough to be heard through closed doors)?  1  Do you often feel tired, fatigued, or sleepy during the daytime (such as falling asleep during driving or talking to someone)? 0  Has anyone observed you stop breathing during your sleep? 0  Do you have, or are you being treated for high blood pressure? 1  BMI more than 35 kg/m2? 0  Age > 50 (1-yes) 1  Neck circumference greater than:Male 16 inches or larger, Male 17inches or larger? 1  Male Gender (Yes=1) 1  Obstructive Sleep Apnea Score 5  Score 5 or greater  Results sent to PCP

## 2015-06-08 MED ORDER — SODIUM CHLORIDE 0.9 % IV SOLN
INTRAVENOUS | Status: DC
  Administered 2015-06-09 (×3): via INTRAVENOUS

## 2015-06-08 MED ORDER — CHLORHEXIDINE GLUCONATE CLOTH 2 % EX PADS: 6.0000 | MEDICATED_PAD | Freq: Once | CUTANEOUS | Status: DC

## 2015-06-08 MED ORDER — DEXTROSE 5 % IV SOLN
1.5000 g | INTRAVENOUS | Status: AC
  Administered 2015-06-09 (×2): 1.5 g via INTRAVENOUS
  Filled 2015-06-08: qty 1.5

## 2015-06-09 ENCOUNTER — Inpatient Hospital Stay (HOSPITAL_COMMUNITY)
Admission: RE | Admit: 2015-06-09 | Discharge: 2015-07-07 | DRG: 268 | Disposition: A | Discharge status: Home Health | Payer: Medicare Other | Source: Ambulatory Visit | Attending: Vascular Surgery | Admitting: Vascular Surgery

## 2015-06-09 ENCOUNTER — Encounter (HOSPITAL_COMMUNITY): Payer: Self-pay | Admitting: *Deleted

## 2015-06-09 ENCOUNTER — Inpatient Hospital Stay (HOSPITAL_COMMUNITY): Payer: Medicare Other | Admitting: Anesthesiology

## 2015-06-09 ENCOUNTER — Inpatient Hospital Stay (HOSPITAL_COMMUNITY): Payer: Medicare Other

## 2015-06-09 ENCOUNTER — Encounter (HOSPITAL_COMMUNITY)
Admission: RE | Disposition: A | Discharge status: Home Health | Payer: Self-pay | Source: Ambulatory Visit | Attending: Vascular Surgery

## 2015-06-09 DIAGNOSIS — Z7901 Long term (current) use of anticoagulants: Secondary | ICD-10-CM

## 2015-06-09 DIAGNOSIS — R0902 Hypoxemia: Secondary | ICD-10-CM | POA: Diagnosis not present

## 2015-06-09 DIAGNOSIS — Z4659 Encounter for fitting and adjustment of other gastrointestinal appliance and device: Secondary | ICD-10-CM

## 2015-06-09 DIAGNOSIS — K859 Acute pancreatitis without necrosis or infection, unspecified: Secondary | ICD-10-CM | POA: Diagnosis not present

## 2015-06-09 DIAGNOSIS — N320 Bladder-neck obstruction: Secondary | ICD-10-CM | POA: Diagnosis not present

## 2015-06-09 DIAGNOSIS — J9601 Acute respiratory failure with hypoxia: Secondary | ICD-10-CM

## 2015-06-09 DIAGNOSIS — Z6827 Body mass index (BMI) 27.0-27.9, adult: Secondary | ICD-10-CM

## 2015-06-09 DIAGNOSIS — M4806 Spinal stenosis, lumbar region: Secondary | ICD-10-CM | POA: Diagnosis not present

## 2015-06-09 DIAGNOSIS — R918 Other nonspecific abnormal finding of lung field: Secondary | ICD-10-CM | POA: Diagnosis not present

## 2015-06-09 DIAGNOSIS — K219 Gastro-esophageal reflux disease without esophagitis: Secondary | ICD-10-CM | POA: Diagnosis present

## 2015-06-09 DIAGNOSIS — E871 Hypo-osmolality and hyponatremia: Secondary | ICD-10-CM | POA: Diagnosis not present

## 2015-06-09 DIAGNOSIS — Z4682 Encounter for fitting and adjustment of non-vascular catheter: Secondary | ICD-10-CM | POA: Diagnosis not present

## 2015-06-09 DIAGNOSIS — K660 Peritoneal adhesions (postprocedural) (postinfection): Secondary | ICD-10-CM | POA: Diagnosis present

## 2015-06-09 DIAGNOSIS — I482 Chronic atrial fibrillation: Secondary | ICD-10-CM | POA: Diagnosis present

## 2015-06-09 DIAGNOSIS — N19 Unspecified kidney failure: Secondary | ICD-10-CM

## 2015-06-09 DIAGNOSIS — N170 Acute kidney failure with tubular necrosis: Secondary | ICD-10-CM | POA: Diagnosis not present

## 2015-06-09 DIAGNOSIS — R0602 Shortness of breath: Secondary | ICD-10-CM | POA: Diagnosis not present

## 2015-06-09 DIAGNOSIS — G9340 Encephalopathy, unspecified: Secondary | ICD-10-CM | POA: Diagnosis not present

## 2015-06-09 DIAGNOSIS — D696 Thrombocytopenia, unspecified: Secondary | ICD-10-CM | POA: Diagnosis present

## 2015-06-09 DIAGNOSIS — J811 Chronic pulmonary edema: Secondary | ICD-10-CM | POA: Diagnosis not present

## 2015-06-09 DIAGNOSIS — K567 Ileus, unspecified: Secondary | ICD-10-CM

## 2015-06-09 DIAGNOSIS — N185 Chronic kidney disease, stage 5: Secondary | ICD-10-CM | POA: Diagnosis not present

## 2015-06-09 DIAGNOSIS — I10 Essential (primary) hypertension: Secondary | ICD-10-CM | POA: Diagnosis present

## 2015-06-09 DIAGNOSIS — I2510 Atherosclerotic heart disease of native coronary artery without angina pectoris: Secondary | ICD-10-CM | POA: Diagnosis present

## 2015-06-09 DIAGNOSIS — Z992 Dependence on renal dialysis: Secondary | ICD-10-CM

## 2015-06-09 DIAGNOSIS — E785 Hyperlipidemia, unspecified: Secondary | ICD-10-CM | POA: Diagnosis present

## 2015-06-09 DIAGNOSIS — I5043 Acute on chronic combined systolic (congestive) and diastolic (congestive) heart failure: Secondary | ICD-10-CM | POA: Diagnosis present

## 2015-06-09 DIAGNOSIS — N280 Ischemia and infarction of kidney: Secondary | ICD-10-CM | POA: Diagnosis present

## 2015-06-09 DIAGNOSIS — I959 Hypotension, unspecified: Secondary | ICD-10-CM | POA: Diagnosis not present

## 2015-06-09 DIAGNOSIS — I110 Hypertensive heart disease with heart failure: Secondary | ICD-10-CM | POA: Diagnosis present

## 2015-06-09 DIAGNOSIS — E872 Acidosis: Secondary | ICD-10-CM | POA: Diagnosis not present

## 2015-06-09 DIAGNOSIS — Z8679 Personal history of other diseases of the circulatory system: Secondary | ICD-10-CM

## 2015-06-09 DIAGNOSIS — E861 Hypovolemia: Secondary | ICD-10-CM | POA: Diagnosis not present

## 2015-06-09 DIAGNOSIS — J9600 Acute respiratory failure, unspecified whether with hypoxia or hypercapnia: Secondary | ICD-10-CM | POA: Diagnosis not present

## 2015-06-09 DIAGNOSIS — I480 Paroxysmal atrial fibrillation: Secondary | ICD-10-CM | POA: Diagnosis present

## 2015-06-09 DIAGNOSIS — Z87891 Personal history of nicotine dependence: Secondary | ICD-10-CM

## 2015-06-09 DIAGNOSIS — D62 Acute posthemorrhagic anemia: Secondary | ICD-10-CM | POA: Diagnosis not present

## 2015-06-09 DIAGNOSIS — I6930 Unspecified sequelae of cerebral infarction: Secondary | ICD-10-CM | POA: Insufficient documentation

## 2015-06-09 DIAGNOSIS — R0682 Tachypnea, not elsewhere classified: Secondary | ICD-10-CM | POA: Insufficient documentation

## 2015-06-09 DIAGNOSIS — N990 Postprocedural (acute) (chronic) kidney failure: Secondary | ICD-10-CM | POA: Diagnosis present

## 2015-06-09 DIAGNOSIS — Z8547 Personal history of malignant neoplasm of testis: Secondary | ICD-10-CM | POA: Diagnosis present

## 2015-06-09 DIAGNOSIS — Z87442 Personal history of urinary calculi: Secondary | ICD-10-CM

## 2015-06-09 DIAGNOSIS — I739 Peripheral vascular disease, unspecified: Secondary | ICD-10-CM | POA: Diagnosis present

## 2015-06-09 DIAGNOSIS — C6290 Malignant neoplasm of unspecified testis, unspecified whether descended or undescended: Secondary | ICD-10-CM | POA: Diagnosis present

## 2015-06-09 DIAGNOSIS — J95821 Acute postprocedural respiratory failure: Secondary | ICD-10-CM | POA: Diagnosis not present

## 2015-06-09 DIAGNOSIS — I719 Aortic aneurysm of unspecified site, without rupture: Secondary | ICD-10-CM

## 2015-06-09 DIAGNOSIS — I714 Abdominal aortic aneurysm, without rupture: Principal | ICD-10-CM | POA: Diagnosis present

## 2015-06-09 DIAGNOSIS — E663 Overweight: Secondary | ICD-10-CM | POA: Diagnosis present

## 2015-06-09 DIAGNOSIS — Z452 Encounter for adjustment and management of vascular access device: Secondary | ICD-10-CM | POA: Diagnosis not present

## 2015-06-09 DIAGNOSIS — D72829 Elevated white blood cell count, unspecified: Secondary | ICD-10-CM

## 2015-06-09 DIAGNOSIS — I509 Heart failure, unspecified: Secondary | ICD-10-CM | POA: Diagnosis not present

## 2015-06-09 DIAGNOSIS — N289 Disorder of kidney and ureter, unspecified: Secondary | ICD-10-CM | POA: Diagnosis not present

## 2015-06-09 DIAGNOSIS — J9690 Respiratory failure, unspecified, unspecified whether with hypoxia or hypercapnia: Secondary | ICD-10-CM

## 2015-06-09 DIAGNOSIS — E43 Unspecified severe protein-calorie malnutrition: Secondary | ICD-10-CM | POA: Diagnosis not present

## 2015-06-09 DIAGNOSIS — Z9889 Other specified postprocedural states: Secondary | ICD-10-CM

## 2015-06-09 DIAGNOSIS — R935 Abnormal findings on diagnostic imaging of other abdominal regions, including retroperitoneum: Secondary | ICD-10-CM | POA: Diagnosis not present

## 2015-06-09 DIAGNOSIS — R109 Unspecified abdominal pain: Secondary | ICD-10-CM

## 2015-06-09 DIAGNOSIS — I252 Old myocardial infarction: Secondary | ICD-10-CM

## 2015-06-09 DIAGNOSIS — E875 Hyperkalemia: Secondary | ICD-10-CM | POA: Diagnosis present

## 2015-06-09 DIAGNOSIS — N189 Chronic kidney disease, unspecified: Secondary | ICD-10-CM | POA: Diagnosis not present

## 2015-06-09 DIAGNOSIS — I4821 Permanent atrial fibrillation: Secondary | ICD-10-CM | POA: Diagnosis present

## 2015-06-09 DIAGNOSIS — I639 Cerebral infarction, unspecified: Secondary | ICD-10-CM | POA: Diagnosis not present

## 2015-06-09 DIAGNOSIS — R1030 Lower abdominal pain, unspecified: Secondary | ICD-10-CM | POA: Diagnosis not present

## 2015-06-09 DIAGNOSIS — N179 Acute kidney failure, unspecified: Secondary | ICD-10-CM | POA: Diagnosis not present

## 2015-06-09 DIAGNOSIS — I713 Abdominal aortic aneurysm, ruptured: Secondary | ICD-10-CM

## 2015-06-09 DIAGNOSIS — J810 Acute pulmonary edema: Secondary | ICD-10-CM | POA: Diagnosis not present

## 2015-06-09 DIAGNOSIS — Z95828 Presence of other vascular implants and grafts: Secondary | ICD-10-CM

## 2015-06-09 DIAGNOSIS — R1911 Absent bowel sounds: Secondary | ICD-10-CM

## 2015-06-09 DIAGNOSIS — Z8673 Personal history of transient ischemic attack (TIA), and cerebral infarction without residual deficits: Secondary | ICD-10-CM | POA: Insufficient documentation

## 2015-06-09 DIAGNOSIS — I701 Atherosclerosis of renal artery: Secondary | ICD-10-CM | POA: Diagnosis present

## 2015-06-09 DIAGNOSIS — J90 Pleural effusion, not elsewhere classified: Secondary | ICD-10-CM | POA: Diagnosis not present

## 2015-06-09 HISTORY — DX: Aortic aneurysm of unspecified site, without rupture: I71.9

## 2015-06-09 HISTORY — DX: Chronic combined systolic (congestive) and diastolic (congestive) heart failure: I50.42

## 2015-06-09 HISTORY — PX: AORTIC/RENAL BYPASS: SHX6299

## 2015-06-09 HISTORY — PX: AORTIC ENDARTERECETOMY: SHX5724

## 2015-06-09 HISTORY — DX: Permanent atrial fibrillation: I48.21

## 2015-06-09 HISTORY — PX: ABDOMINAL AORTIC ANEURYSM REPAIR: SHX42

## 2015-06-09 LAB — POCT I-STAT 4, (NA,K, GLUC, HGB,HCT)
Glucose, Bld: 163 mg/dL — ABNORMAL HIGH (ref 65–99)
HCT: 33 % — ABNORMAL LOW (ref 39.0–52.0)
Hemoglobin: 11.2 g/dL — ABNORMAL LOW (ref 13.0–17.0)
Potassium: 5.1 mmol/L (ref 3.5–5.1)
Sodium: 140 mmol/L (ref 135–145)

## 2015-06-09 LAB — POCT I-STAT 7, (LYTES, BLD GAS, ICA,H+H)
Acid-base deficit: 1 mmol/L (ref 0.0–2.0)
Acid-base deficit: 6 mmol/L — ABNORMAL HIGH (ref 0.0–2.0)
Acid-base deficit: 10 mmol/L — ABNORMAL HIGH (ref 0.0–2.0)
Acid-base deficit: 13 mmol/L — ABNORMAL HIGH (ref 0.0–2.0)
Acid-base deficit: 5 mmol/L — ABNORMAL HIGH (ref 0.0–2.0)
Acid-base deficit: 3 mmol/L — ABNORMAL HIGH (ref 0.0–2.0)
Bicarbonate: 23.9 mEq/L (ref 20.0–24.0)
Bicarbonate: 18.8 mEq/L — ABNORMAL LOW (ref 20.0–24.0)
Bicarbonate: 16.5 mEq/L — ABNORMAL LOW (ref 20.0–24.0)
Bicarbonate: 16.6 mEq/L — ABNORMAL LOW (ref 20.0–24.0)
Bicarbonate: 20.9 mEq/L (ref 20.0–24.0)
Bicarbonate: 22.7 mEq/L (ref 20.0–24.0)
Calcium, Ion: 1.23 mmol/L (ref 1.13–1.30)
Calcium, Ion: 1.09 mmol/L — ABNORMAL LOW (ref 1.13–1.30)
Calcium, Ion: 1.18 mmol/L (ref 1.13–1.30)
Calcium, Ion: 1.7 mmol/L — ABNORMAL HIGH (ref 1.13–1.30)
Calcium, Ion: 1.26 mmol/L (ref 1.13–1.30)
Calcium, Ion: 1.19 mmol/L (ref 1.13–1.30)
HCT: 34 % — ABNORMAL LOW (ref 39.0–52.0)
HCT: 30 % — ABNORMAL LOW (ref 39.0–52.0)
HCT: 35 % — ABNORMAL LOW (ref 39.0–52.0)
HCT: 37 % — ABNORMAL LOW (ref 39.0–52.0)
HCT: 30 % — ABNORMAL LOW (ref 39.0–52.0)
HCT: 32 % — ABNORMAL LOW (ref 39.0–52.0)
Hemoglobin: 11.6 g/dL — ABNORMAL LOW (ref 13.0–17.0)
Hemoglobin: 10.2 g/dL — ABNORMAL LOW (ref 13.0–17.0)
Hemoglobin: 11.9 g/dL — ABNORMAL LOW (ref 13.0–17.0)
Hemoglobin: 12.6 g/dL — ABNORMAL LOW (ref 13.0–17.0)
Hemoglobin: 10.2 g/dL — ABNORMAL LOW (ref 13.0–17.0)
Hemoglobin: 10.9 g/dL — ABNORMAL LOW (ref 13.0–17.0)
O2 Saturation: 100 %
O2 Saturation: 99 %
O2 Saturation: 97 %
O2 Saturation: 98 %
O2 Saturation: 99 %
O2 Saturation: 97 %
Patient temperature: 36
Patient temperature: 37
Patient temperature: 37.5
Patient temperature: 37
Potassium: 4.4 mmol/L (ref 3.5–5.1)
Potassium: 5.5 mmol/L — ABNORMAL HIGH (ref 3.5–5.1)
Potassium: 6.1 mmol/L (ref 3.5–5.1)
Potassium: 6.6 mmol/L (ref 3.5–5.1)
Potassium: 5.9 mmol/L — ABNORMAL HIGH (ref 3.5–5.1)
Potassium: 5.1 mmol/L (ref 3.5–5.1)
Sodium: 139 mmol/L (ref 135–145)
Sodium: 136 mmol/L (ref 135–145)
Sodium: 135 mmol/L (ref 135–145)
Sodium: 138 mmol/L (ref 135–145)
Sodium: 138 mmol/L (ref 135–145)
Sodium: 141 mmol/L (ref 135–145)
TCO2: 25 mmol/L (ref 0–100)
TCO2: 20 mmol/L (ref 0–100)
TCO2: 18 mmol/L (ref 0–100)
TCO2: 18 mmol/L (ref 0–100)
TCO2: 22 mmol/L (ref 0–100)
TCO2: 24 mmol/L (ref 0–100)
pCO2 arterial: 36.7 mmHg (ref 35.0–45.0)
pCO2 arterial: 35 mmHg (ref 35.0–45.0)
pCO2 arterial: 38 mmHg (ref 35.0–45.0)
pCO2 arterial: 51.2 mmHg — ABNORMAL HIGH (ref 35.0–45.0)
pCO2 arterial: 42.8 mmHg (ref 35.0–45.0)
pCO2 arterial: 43.6 mmHg (ref 35.0–45.0)
pH, Arterial: 7.416 (ref 7.350–7.450)
pH, Arterial: 7.339 — ABNORMAL LOW (ref 7.350–7.450)
pH, Arterial: 7.116 — CL (ref 7.350–7.450)
pH, Arterial: 7.25 — ABNORMAL LOW (ref 7.350–7.450)
pH, Arterial: 7.296 — ABNORMAL LOW (ref 7.350–7.450)
pH, Arterial: 7.324 — ABNORMAL LOW (ref 7.350–7.450)
pO2, Arterial: 184 mmHg — ABNORMAL HIGH (ref 80.0–100.0)
pO2, Arterial: 161 mmHg — ABNORMAL HIGH (ref 80.0–100.0)
pO2, Arterial: 108 mmHg — ABNORMAL HIGH (ref 80.0–100.0)
pO2, Arterial: 139 mmHg — ABNORMAL HIGH (ref 80.0–100.0)
pO2, Arterial: 151 mmHg — ABNORMAL HIGH (ref 80.0–100.0)
pO2, Arterial: 100 mmHg (ref 80.0–100.0)

## 2015-06-09 LAB — PROTIME-INR
INR: 1.07 (ref 0.00–1.49)
INR: 1.4 (ref 0.00–1.49)
Prothrombin Time: 14.1 seconds (ref 11.6–15.2)
Prothrombin Time: 17.3 seconds — ABNORMAL HIGH (ref 11.6–15.2)

## 2015-06-09 LAB — COMPREHENSIVE METABOLIC PANEL
ALT: 27 U/L (ref 17–63)
AST: 67 U/L — ABNORMAL HIGH (ref 15–41)
Albumin: 2.8 g/dL — ABNORMAL LOW (ref 3.5–5.0)
Alkaline Phosphatase: 42 U/L (ref 38–126)
Anion gap: 11 (ref 5–15)
BUN: 18 mg/dL (ref 6–20)
CO2: 24 mmol/L (ref 22–32)
Calcium: 8.4 mg/dL — ABNORMAL LOW (ref 8.9–10.3)
Chloride: 102 mmol/L (ref 101–111)
Creatinine, Ser: 2.34 mg/dL — ABNORMAL HIGH (ref 0.61–1.24)
GFR calc Af Amer: 33 mL/min — ABNORMAL LOW (ref >60)
GFR calc non Af Amer: 28 mL/min — ABNORMAL LOW (ref >60)
Glucose, Bld: 124 mg/dL — ABNORMAL HIGH (ref 65–99)
Potassium: 5.3 mmol/L — ABNORMAL HIGH (ref 3.5–5.1)
Sodium: 137 mmol/L (ref 135–145)
Total Bilirubin: 1.4 mg/dL — ABNORMAL HIGH (ref 0.3–1.2)
Total Protein: 4.8 g/dL — ABNORMAL LOW (ref 6.5–8.1)

## 2015-06-09 LAB — BLOOD GAS, ARTERIAL
Acid-base deficit: 0.4 mmol/L (ref 0.0–2.0)
Bicarbonate: 23.9 mEq/L (ref 20.0–24.0)
Drawn by: 449841
FIO2: 0.21
O2 Saturation: 78.4 %
Patient temperature: 98.6
TCO2: 25.1 mmol/L (ref 0–100)
pCO2 arterial: 39.7 mmHg (ref 35.0–45.0)
pH, Arterial: 7.396 (ref 7.350–7.450)
pO2, Arterial: 43.8 mmHg — ABNORMAL LOW (ref 80.0–100.0)

## 2015-06-09 LAB — PREPARE RBC (CROSSMATCH)

## 2015-06-09 LAB — POCT I-STAT 3, ART BLOOD GAS (G3+)
Bicarbonate: 26.4 mEq/L — ABNORMAL HIGH (ref 20.0–24.0)
O2 Saturation: 100 %
Patient temperature: 37.4
TCO2: 28 mmol/L (ref 0–100)
pCO2 arterial: 54.1 mmHg — ABNORMAL HIGH (ref 35.0–45.0)
pH, Arterial: 7.299 — ABNORMAL LOW (ref 7.350–7.450)
pO2, Arterial: 311 mmHg — ABNORMAL HIGH (ref 80.0–100.0)

## 2015-06-09 LAB — CBC
HCT: 38.5 % — ABNORMAL LOW (ref 39.0–52.0)
Hemoglobin: 12.7 g/dL — ABNORMAL LOW (ref 13.0–17.0)
MCH: 28.2 pg (ref 26.0–34.0)
MCHC: 33 g/dL (ref 30.0–36.0)
MCV: 85.4 fL (ref 78.0–100.0)
Platelets: 99 10*3/uL — ABNORMAL LOW (ref 150–400)
RBC: 4.51 MIL/uL (ref 4.22–5.81)
RDW: 16.1 % — ABNORMAL HIGH (ref 11.5–15.5)
WBC: 17.7 10*3/uL — ABNORMAL HIGH (ref 4.0–10.5)

## 2015-06-09 LAB — APTT
aPTT: 33 seconds (ref 24–37)
aPTT: 36 seconds (ref 24–37)

## 2015-06-09 LAB — POCT I-STAT 3, VENOUS BLOOD GAS (G3P V)
Acid-base deficit: 2 mmol/L (ref 0.0–2.0)
Bicarbonate: 23.8 mEq/L (ref 20.0–24.0)
O2 Saturation: 89 %
TCO2: 25 mmol/L (ref 0–100)
pCO2, Ven: 41.4 mmHg — ABNORMAL LOW (ref 45.0–50.0)
pH, Ven: 7.367 — ABNORMAL HIGH (ref 7.250–7.300)
pO2, Ven: 59 mmHg — ABNORMAL HIGH (ref 30.0–45.0)

## 2015-06-09 LAB — GLUCOSE, CAPILLARY: Glucose-Capillary: 106 mg/dL — ABNORMAL HIGH (ref 65–99)

## 2015-06-09 LAB — MAGNESIUM: Magnesium: 1.6 mg/dL — ABNORMAL LOW (ref 1.7–2.4)

## 2015-06-09 SURGERY — ANEURYSM ABDOMINAL AORTIC REPAIR
Anesthesia: General | Site: Abdomen

## 2015-06-09 MED ORDER — ALBUMIN HUMAN 5 % IV SOLN
INTRAVENOUS | Status: AC
  Filled 2015-06-09: qty 250

## 2015-06-09 MED ORDER — DEXTROSE 50 % IV SOLN
50.0000 mL | INTRAVENOUS | Status: AC
  Administered 2015-06-09: 25 g via INTRAVENOUS
  Filled 2015-06-09: qty 50

## 2015-06-09 MED ORDER — ESMOLOL HCL 100 MG/10ML IV SOLN
INTRAVENOUS | Status: AC
  Filled 2015-06-09: qty 10

## 2015-06-09 MED ORDER — HEPARIN SODIUM (PORCINE) 1000 UNIT/ML IJ SOLN
INTRAMUSCULAR | Status: AC
  Filled 2015-06-09: qty 1

## 2015-06-09 MED ORDER — DEXMEDETOMIDINE HCL IN NACL 200 MCG/50ML IV SOLN
INTRAVENOUS | Status: AC
  Filled 2015-06-09: qty 50

## 2015-06-09 MED ORDER — HEPARIN SODIUM (PORCINE) 1000 UNIT/ML IJ SOLN
INTRAMUSCULAR | Status: AC
  Filled 2015-06-09: qty 2

## 2015-06-09 MED ORDER — FENTANYL CITRATE (PF) 250 MCG/5ML IJ SOLN
INTRAMUSCULAR | Status: AC
  Filled 2015-06-09: qty 5

## 2015-06-09 MED ORDER — LACTATED RINGERS IV SOLN
INTRAVENOUS | Status: DC
  Administered 2015-06-09: 75 mL/h via INTRAVENOUS
  Administered 2015-06-09 – 2015-06-10 (×2): via INTRAVENOUS

## 2015-06-09 MED ORDER — GUAIFENESIN-DM 100-10 MG/5ML PO SYRP: 15.0000 mL | ORAL_SOLUTION | ORAL | Status: DC | PRN

## 2015-06-09 MED ORDER — ENOXAPARIN SODIUM 40 MG/0.4ML ~~LOC~~ SOLN
40.0000 mg | SUBCUTANEOUS | Status: DC
  Administered 2015-06-10: 40 mg via SUBCUTANEOUS
  Filled 2015-06-09: qty 0.4

## 2015-06-09 MED ORDER — ALBUMIN HUMAN 5 % IV SOLN
INTRAVENOUS | Status: DC | PRN
Start: 2015-06-09 — End: 2015-06-09
  Administered 2015-06-09: 16:00:00 via INTRAVENOUS

## 2015-06-09 MED ORDER — MORPHINE SULFATE (PF) 2 MG/ML IV SOLN
2.0000 mg | INTRAVENOUS | Status: DC | PRN
  Administered 2015-06-09 – 2015-06-10 (×5): 2 mg via INTRAVENOUS
  Administered 2015-06-10: 4 mg via INTRAVENOUS
  Administered 2015-06-10: 2 mg via INTRAVENOUS
  Administered 2015-06-10 (×3): 4 mg via INTRAVENOUS
  Administered 2015-06-10: 2 mg via INTRAVENOUS
  Administered 2015-06-10 – 2015-06-12 (×11): 4 mg via INTRAVENOUS
  Administered 2015-06-12: 1 mg via INTRAVENOUS
  Administered 2015-06-12 (×2): 4 mg via INTRAVENOUS
  Administered 2015-06-12: 1 mg via INTRAVENOUS
  Administered 2015-06-12: 2 mg via INTRAVENOUS
  Administered 2015-06-12 (×3): 4 mg via INTRAVENOUS
  Administered 2015-06-12 (×2): 2 mg via INTRAVENOUS
  Administered 2015-06-13 (×3): 4 mg via INTRAVENOUS
  Filled 2015-06-09 (×6): qty 2
  Filled 2015-06-09 (×2): qty 1
  Filled 2015-06-09 (×4): qty 2
  Filled 2015-06-09: qty 1
  Filled 2015-06-09 (×4): qty 2
  Filled 2015-06-09: qty 1
  Filled 2015-06-09 (×2): qty 2
  Filled 2015-06-09 (×3): qty 1
  Filled 2015-06-09: qty 2
  Filled 2015-06-09: qty 1
  Filled 2015-06-09: qty 2
  Filled 2015-06-09: qty 1
  Filled 2015-06-09 (×2): qty 2
  Filled 2015-06-09 (×2): qty 1
  Filled 2015-06-09: qty 2
  Filled 2015-06-09 (×2): qty 1
  Filled 2015-06-09: qty 2
  Filled 2015-06-09: qty 1

## 2015-06-09 MED ORDER — SODIUM BICARBONATE 4.2 % IV SOLN
INTRAVENOUS | Status: DC | PRN
  Administered 2015-06-09: 50 meq via INTRAVENOUS
  Administered 2015-06-09: 100 meq via INTRAVENOUS
  Administered 2015-06-09: 50 meq via INTRAVENOUS

## 2015-06-09 MED ORDER — ROCURONIUM BROMIDE 50 MG/5ML IV SOLN
INTRAVENOUS | Status: AC
  Filled 2015-06-09: qty 1

## 2015-06-09 MED ORDER — MIDAZOLAM HCL 2 MG/2ML IJ SOLN
INTRAMUSCULAR | Status: AC
  Filled 2015-06-09: qty 2

## 2015-06-09 MED ORDER — INSULIN ASPART 100 UNIT/ML IV SOLN
10.0000 [IU] | INTRAVENOUS | Status: AC
  Administered 2015-06-09: 10 [IU] via INTRAVENOUS
  Filled 2015-06-09: qty 0.1

## 2015-06-09 MED ORDER — HYDRALAZINE HCL 20 MG/ML IJ SOLN
5.0000 mg | INTRAMUSCULAR | Status: DC | PRN
  Administered 2015-06-30: 5 mg via INTRAVENOUS
  Filled 2015-06-09: qty 1

## 2015-06-09 MED ORDER — ONDANSETRON HCL 4 MG/2ML IJ SOLN
4.0000 mg | Freq: Four times a day (QID) | INTRAMUSCULAR | Status: DC | PRN
  Administered 2015-06-17 – 2015-06-21 (×10): 4 mg via INTRAVENOUS
  Filled 2015-06-09 (×9): qty 2

## 2015-06-09 MED ORDER — HEMOSTATIC AGENTS (NO CHARGE) OPTIME
TOPICAL | Status: DC | PRN
  Administered 2015-06-09: 1 via TOPICAL

## 2015-06-09 MED ORDER — ACETAMINOPHEN 650 MG RE SUPP: 325.0000 mg | RECTAL | Status: DC | PRN

## 2015-06-09 MED ORDER — ACETAMINOPHEN 325 MG PO TABS: 325.0000 mg | ORAL_TABLET | ORAL | Status: DC | PRN

## 2015-06-09 MED ORDER — CHLORHEXIDINE GLUCONATE 0.12% ORAL RINSE (MEDLINE KIT): 15.0000 mL | Freq: Two times a day (BID) | OROMUCOSAL | Status: DC

## 2015-06-09 MED ORDER — DEXTROSE 5 % IV SOLN
1.5000 g | INTRAVENOUS | Status: DC
  Filled 2015-06-09: qty 1.5

## 2015-06-09 MED ORDER — ALBUMIN HUMAN 5 % IV SOLN
INTRAVENOUS | Status: DC | PRN
  Administered 2015-06-09 (×4): via INTRAVENOUS

## 2015-06-09 MED ORDER — PROPOFOL 10 MG/ML IV BOLUS
INTRAVENOUS | Status: AC
  Filled 2015-06-09: qty 20

## 2015-06-09 MED ORDER — POTASSIUM CHLORIDE CRYS ER 20 MEQ PO TBCR: 20.0000 meq | EXTENDED_RELEASE_TABLET | Freq: Every day | ORAL | Status: DC | PRN

## 2015-06-09 MED ORDER — PHENYLEPHRINE 40 MCG/ML (10ML) SYRINGE FOR IV PUSH (FOR BLOOD PRESSURE SUPPORT)
PREFILLED_SYRINGE | INTRAVENOUS | Status: AC
  Filled 2015-06-09: qty 10

## 2015-06-09 MED ORDER — MAGNESIUM SULFATE 2 GM/50ML IV SOLN
2.0000 g | Freq: Every day | INTRAVENOUS | Status: AC | PRN
  Administered 2015-06-11: 2 g via INTRAVENOUS
  Filled 2015-06-09: qty 50

## 2015-06-09 MED ORDER — PHENYLEPHRINE HCL 10 MG/ML IJ SOLN
10.0000 mg | INTRAVENOUS | Status: DC | PRN
  Administered 2015-06-09: 10 ug/min via INTRAVENOUS
  Administered 2015-06-09: 14:00:00 via INTRAVENOUS

## 2015-06-09 MED ORDER — KCL IN DEXTROSE-NACL 20-5-0.45 MEQ/L-%-% IV SOLN
INTRAVENOUS | Status: DC
  Filled 2015-06-09 (×3): qty 1000

## 2015-06-09 MED ORDER — ALUM & MAG HYDROXIDE-SIMETH 200-200-20 MG/5ML PO SUSP: 15.0000 mL | ORAL | Status: DC | PRN

## 2015-06-09 MED ORDER — ALBUTEROL SULFATE (2.5 MG/3ML) 0.083% IN NEBU
2.5000 mg | INHALATION_SOLUTION | RESPIRATORY_TRACT | Status: DC
  Administered 2015-06-09 – 2015-06-11 (×11): 2.5 mg via RESPIRATORY_TRACT
  Filled 2015-06-09 (×11): qty 3

## 2015-06-09 MED ORDER — SODIUM CHLORIDE 0.9 % IV BOLUS (SEPSIS)
1000.0000 mL | Freq: Once | INTRAVENOUS | Status: AC
  Administered 2015-06-09: 1000 mL via INTRAVENOUS

## 2015-06-09 MED ORDER — DOCUSATE SODIUM 100 MG PO CAPS: 100.0000 mg | ORAL_CAPSULE | Freq: Every day | ORAL | Status: DC

## 2015-06-09 MED ORDER — PROTAMINE SULFATE 10 MG/ML IV SOLN
INTRAVENOUS | Status: DC | PRN
  Administered 2015-06-09: 100 mg via INTRAVENOUS

## 2015-06-09 MED ORDER — PROPOFOL 10 MG/ML IV BOLUS
INTRAVENOUS | Status: DC | PRN
  Administered 2015-06-09: 150 mg via INTRAVENOUS

## 2015-06-09 MED ORDER — CALCIUM CHLORIDE 10 % IV SOLN
INTRAVENOUS | Status: AC
  Filled 2015-06-09: qty 20

## 2015-06-09 MED ORDER — FENTANYL CITRATE (PF) 100 MCG/2ML IJ SOLN
INTRAMUSCULAR | Status: DC | PRN
  Administered 2015-06-09 (×2): 100 ug via INTRAVENOUS
  Administered 2015-06-09: 250 ug via INTRAVENOUS
  Administered 2015-06-09 (×2): 150 ug via INTRAVENOUS
  Administered 2015-06-09: 100 ug via INTRAVENOUS
  Administered 2015-06-09: 50 ug via INTRAVENOUS
  Administered 2015-06-09: 150 ug via INTRAVENOUS
  Administered 2015-06-09 (×3): 100 ug via INTRAVENOUS
  Administered 2015-06-09: 150 ug via INTRAVENOUS

## 2015-06-09 MED ORDER — CALCIUM CHLORIDE 10 % IV SOLN
INTRAVENOUS | Status: DC | PRN
  Administered 2015-06-09 (×2): 200 mg via INTRAVENOUS
  Administered 2015-06-09: 100 mg via INTRAVENOUS
  Administered 2015-06-09 (×2): 500 mg via INTRAVENOUS

## 2015-06-09 MED ORDER — SODIUM CHLORIDE 0.9 % IV SOLN
500.0000 mL | Freq: Once | INTRAVENOUS | Status: AC | PRN
  Administered 2015-06-10: 500 mL via INTRAVENOUS

## 2015-06-09 MED ORDER — PANTOPRAZOLE SODIUM 40 MG IV SOLR
40.0000 mg | Freq: Every day | INTRAVENOUS | Status: DC
  Administered 2015-06-09 – 2015-06-11 (×3): 40 mg via INTRAVENOUS
  Filled 2015-06-09 (×3): qty 40

## 2015-06-09 MED ORDER — SODIUM CHLORIDE 0.9 % IV SOLN
INTRAVENOUS | Status: DC | PRN
  Administered 2015-06-09: 500 mL

## 2015-06-09 MED ORDER — DEXMEDETOMIDINE HCL IN NACL 400 MCG/100ML IV SOLN
0.0000 ug/kg/h | INTRAVENOUS | Status: DC
  Administered 2015-06-09 – 2015-06-10 (×5): 1.2 ug/kg/h via INTRAVENOUS
  Filled 2015-06-09 (×6): qty 100

## 2015-06-09 MED ORDER — PHENOL 1.4 % MT LIQD: 1.0000 | OROMUCOSAL | Status: DC | PRN

## 2015-06-09 MED ORDER — 0.9 % SODIUM CHLORIDE (POUR BTL) OPTIME
TOPICAL | Status: DC | PRN
  Administered 2015-06-09: 1000 mL
  Administered 2015-06-09: 3000 mL

## 2015-06-09 MED ORDER — EPHEDRINE SULFATE 50 MG/ML IJ SOLN
INTRAMUSCULAR | Status: AC
  Filled 2015-06-09: qty 1

## 2015-06-09 MED ORDER — HEPARIN SODIUM (PORCINE) 1000 UNIT/ML IJ SOLN
INTRAMUSCULAR | Status: DC | PRN
  Administered 2015-06-09 (×3): 4500 [IU] via INTRAVENOUS
  Administered 2015-06-09: 9000 [IU] via INTRAVENOUS

## 2015-06-09 MED ORDER — ALBUTEROL SULFATE (2.5 MG/3ML) 0.083% IN NEBU
3.0000 mL | INHALATION_SOLUTION | RESPIRATORY_TRACT | Status: DC | PRN
  Administered 2015-06-10: 3 mL via RESPIRATORY_TRACT
  Filled 2015-06-09: qty 3

## 2015-06-09 MED ORDER — LACTATED RINGERS IV SOLN
INTRAVENOUS | Status: DC | PRN
  Administered 2015-06-09 (×3): via INTRAVENOUS

## 2015-06-09 MED ORDER — LIDOCAINE HCL (CARDIAC) 20 MG/ML IV SOLN
INTRAVENOUS | Status: AC
  Filled 2015-06-09: qty 5

## 2015-06-09 MED ORDER — LIDOCAINE HCL (CARDIAC) 20 MG/ML IV SOLN
INTRAVENOUS | Status: DC | PRN
  Administered 2015-06-09: 60 mg via INTRAVENOUS

## 2015-06-09 MED ORDER — EPHEDRINE SULFATE 50 MG/ML IJ SOLN
INTRAMUSCULAR | Status: DC | PRN
  Administered 2015-06-09: 20 mg via INTRAVENOUS

## 2015-06-09 MED ORDER — LABETALOL HCL 5 MG/ML IV SOLN
10.0000 mg | INTRAVENOUS | Status: AC | PRN
  Administered 2015-06-09 – 2015-07-01 (×4): 10 mg via INTRAVENOUS
  Filled 2015-06-09 (×5): qty 4

## 2015-06-09 MED ORDER — MIDAZOLAM HCL 5 MG/5ML IJ SOLN
INTRAMUSCULAR | Status: DC | PRN
  Administered 2015-06-09: 2 mg via INTRAVENOUS

## 2015-06-09 MED ORDER — ANTISEPTIC ORAL RINSE SOLUTION (CORINZ)
7.0000 mL | Freq: Four times a day (QID) | OROMUCOSAL | Status: DC
Start: 2015-06-10 — End: 2015-06-09

## 2015-06-09 MED ORDER — DEXMEDETOMIDINE HCL IN NACL 400 MCG/100ML IV SOLN
0.4000 ug/kg/h | INTRAVENOUS | Status: AC
  Administered 2015-06-09: .3 ug/kg/h via INTRAVENOUS
  Filled 2015-06-09: qty 100

## 2015-06-09 MED ORDER — ONDANSETRON HCL 4 MG/2ML IJ SOLN: 4.0000 mg | Freq: Once | INTRAMUSCULAR | Status: DC | PRN

## 2015-06-09 MED ORDER — BISACODYL 10 MG RE SUPP: 10.0000 mg | Freq: Every day | RECTAL | Status: DC | PRN

## 2015-06-09 MED ORDER — MANNITOL 25 % IV SOLN
INTRAVENOUS | Status: DC | PRN
  Administered 2015-06-09: 25 g via INTRAVENOUS

## 2015-06-09 MED ORDER — DEXTROSE 5 % IV SOLN
1.5000 g | Freq: Two times a day (BID) | INTRAVENOUS | Status: AC
  Administered 2015-06-09 – 2015-06-10 (×2): 1.5 g via INTRAVENOUS
  Filled 2015-06-09 (×2): qty 1.5

## 2015-06-09 MED ORDER — SODIUM CHLORIDE 0.9 % IV SOLN: Freq: Once | INTRAVENOUS | Status: DC

## 2015-06-09 MED ORDER — CHLORHEXIDINE GLUCONATE 0.12% ORAL RINSE (MEDLINE KIT)
15.0000 mL | Freq: Two times a day (BID) | OROMUCOSAL | Status: DC
  Administered 2015-06-09 – 2015-06-10 (×3): 15 mL via OROMUCOSAL

## 2015-06-09 MED ORDER — ANTISEPTIC ORAL RINSE SOLUTION (CORINZ)
7.0000 mL | Freq: Four times a day (QID) | OROMUCOSAL | Status: DC
Start: 2015-06-10 — End: 2015-06-11
  Administered 2015-06-09 – 2015-06-11 (×5): 7 mL via OROMUCOSAL

## 2015-06-09 MED ORDER — SUCCINYLCHOLINE CHLORIDE 20 MG/ML IJ SOLN
INTRAMUSCULAR | Status: AC
  Filled 2015-06-09: qty 1

## 2015-06-09 MED ORDER — HYDROMORPHONE HCL 1 MG/ML IJ SOLN
0.5000 mg | INTRAMUSCULAR | Status: DC | PRN
  Administered 2015-06-10: 0.5 mg via INTRAVENOUS
  Filled 2015-06-09: qty 1

## 2015-06-09 MED ORDER — PROTAMINE SULFATE 10 MG/ML IV SOLN
INTRAVENOUS | Status: AC
  Filled 2015-06-09: qty 5

## 2015-06-09 MED ORDER — METOPROLOL TARTRATE 1 MG/ML IV SOLN
2.0000 mg | INTRAVENOUS | Status: AC | PRN
  Administered 2015-06-10 (×2): 5 mg via INTRAVENOUS

## 2015-06-09 MED ORDER — ROCURONIUM BROMIDE 50 MG/5ML IV SOLN
INTRAVENOUS | Status: AC
  Filled 2015-06-09: qty 4

## 2015-06-09 MED ORDER — ESMOLOL HCL 100 MG/10ML IV SOLN
INTRAVENOUS | Status: DC | PRN
  Administered 2015-06-09 (×2): 20 mg via INTRAVENOUS

## 2015-06-09 MED ORDER — THROMBIN 20000 UNITS EX SOLR
CUTANEOUS | Status: DC | PRN
  Administered 2015-06-09: 20 mL via TOPICAL

## 2015-06-09 MED ORDER — ROCURONIUM BROMIDE 100 MG/10ML IV SOLN
INTRAVENOUS | Status: DC | PRN
  Administered 2015-06-09 (×3): 50 mg via INTRAVENOUS
  Administered 2015-06-09: 20 mg via INTRAVENOUS
  Administered 2015-06-09: 30 mg via INTRAVENOUS
  Administered 2015-06-09: 50 mg via INTRAVENOUS

## 2015-06-09 MED ORDER — SODIUM CHLORIDE 0.9 % IV SOLN
Freq: Once | INTRAVENOUS | Status: DC
Start: 2015-06-09 — End: 2015-06-09

## 2015-06-09 MED ORDER — THROMBIN 20000 UNITS EX SOLR
CUTANEOUS | Status: AC
  Filled 2015-06-09: qty 20000

## 2015-06-09 MED ORDER — SODIUM CHLORIDE 0.9 % IV BOLUS (SEPSIS)
1000.0000 mL | Freq: Once | INTRAVENOUS | Status: AC | PRN
  Administered 2015-06-09: 500 mL via INTRAVENOUS

## 2015-06-09 MED ORDER — SODIUM CHLORIDE 0.9 % IJ SOLN
INTRAMUSCULAR | Status: AC
  Filled 2015-06-09: qty 10

## 2015-06-09 SURGICAL SUPPLY — 55 items
ATTRACTOMAT 16X20 MAGNETIC DRP (DRAPES) IMPLANT
BALLN CODA OCL 2-9.0-35-120-3 (BALLOONS) ×6
BALLOON COD OCL 2-9.0-35-120-3 (BALLOONS) ×4 IMPLANT
CANISTER SUCTION 2500CC (MISCELLANEOUS) ×3 IMPLANT
CANNULA VESSEL 3MM 2 BLNT TIP (CANNULA) ×6 IMPLANT
CLIP TI MEDIUM 24 (CLIP) ×3 IMPLANT
CLIP TI WIDE RED SMALL 24 (CLIP) ×3 IMPLANT
DRSG COVADERM 4X14 (GAUZE/BANDAGES/DRESSINGS) ×3 IMPLANT
DRSG COVADERM 4X8 (GAUZE/BANDAGES/DRESSINGS) ×3 IMPLANT
ELECT BLADE 6.5 EXT (BLADE) ×3 IMPLANT
ELECT REM PT RETURN 9FT ADLT (ELECTROSURGICAL) ×3
ELECTRODE REM PT RTRN 9FT ADLT (ELECTROSURGICAL) ×2 IMPLANT
GLOVE BIO SURGEON STRL SZ 6.5 (GLOVE) ×9 IMPLANT
GLOVE BIO SURGEON STRL SZ7.5 (GLOVE) ×6 IMPLANT
GLOVE BIOGEL PI IND STRL 6.5 (GLOVE) ×8 IMPLANT
GLOVE BIOGEL PI IND STRL 7.0 (GLOVE) ×6 IMPLANT
GLOVE BIOGEL PI IND STRL 8 (GLOVE) ×2 IMPLANT
GLOVE BIOGEL PI INDICATOR 6.5 (GLOVE) ×4
GLOVE BIOGEL PI INDICATOR 7.0 (GLOVE) ×3
GLOVE BIOGEL PI INDICATOR 8 (GLOVE) ×1
GLOVE SKINSENSE NS SZ6.5 (GLOVE) ×4
GLOVE SKINSENSE NS SZ7.0 (GLOVE) ×4
GLOVE SKINSENSE STRL SZ6.5 (GLOVE) ×8 IMPLANT
GLOVE SKINSENSE STRL SZ7.0 (GLOVE) ×8 IMPLANT
GOWN EXTRA PROTECTION XL (GOWNS) ×12 IMPLANT
GOWN STRL REUS W/ TWL LRG LVL3 (GOWN DISPOSABLE) ×12 IMPLANT
GOWN STRL REUS W/TWL LRG LVL3 (GOWN DISPOSABLE) ×18
GRAFT CV 30X6KNTD STRG TUBE (Vascular Products) ×2 IMPLANT
GRAFT HEMASHIELD 6MM (Vascular Products) ×2 IMPLANT
GRAFT VASC 20MMX15CM ×3 IMPLANT
INSERT FOGARTY 61MM (MISCELLANEOUS) ×9 IMPLANT
INSERT FOGARTY SM (MISCELLANEOUS) ×12 IMPLANT
KIT BASIN OR (CUSTOM PROCEDURE TRAY) ×3 IMPLANT
KIT ROOM TURNOVER OR (KITS) ×3 IMPLANT
LOOP VESSEL MINI RED (MISCELLANEOUS) IMPLANT
NS IRRIG 1000ML POUR BTL (IV SOLUTION) ×6 IMPLANT
PACK AORTA (CUSTOM PROCEDURE TRAY) ×3 IMPLANT
PAD ARMBOARD 7.5X6 YLW CONV (MISCELLANEOUS) ×6 IMPLANT
PUNCH AORTIC ROTATE 5MM 8IN (MISCELLANEOUS) ×3 IMPLANT
RETAINER VISCERA MED (MISCELLANEOUS) ×3 IMPLANT
SPONGE LAP 18X18 X RAY DECT (DISPOSABLE) ×3 IMPLANT
SPONGE SURGIFOAM ABS GEL 100 (HEMOSTASIS) IMPLANT
STAPLER VISISTAT 35W (STAPLE) ×6 IMPLANT
SUT PDS AB 1 TP1 54 (SUTURE) ×6 IMPLANT
SUT PROLENE 3 0 SH DA (SUTURE) IMPLANT
SUT PROLENE 3 0 SH1 36 (SUTURE) ×30 IMPLANT
SUT PROLENE 5 0 CC 1 (SUTURE) ×18 IMPLANT
SUT PROLENE 6 0 C 1 30 (SUTURE) ×3 IMPLANT
SUT PROLENE 6 0 CC (SUTURE) ×21 IMPLANT
SUT SILK 2 0SH CR/8 30 (SUTURE) ×3 IMPLANT
SUT VIC AB 2-0 CT1 36 (SUTURE) IMPLANT
SUT VIC AB 3-0 SH 27 (SUTURE) ×4
SUT VIC AB 3-0 SH 27X BRD (SUTURE) ×4 IMPLANT
TRAY FOLEY W/METER SILVER 16FR (SET/KITS/TRAYS/PACK) ×3 IMPLANT
WATER STERILE IRR 1000ML POUR (IV SOLUTION) ×6 IMPLANT

## 2015-06-09 NOTE — Interval H&P Note (Signed)
History and Physical Interval Note:  06/09/2015 7:25 AM  [name] [name]  has presented today for surgery, with the diagnosis of Abdominal aortic aneurysm I71.4  The various methods of treatment have been discussed with the patient and family. After consideration of risks, benefits and other options for treatment, the patient has consented to  Procedure(s): ANEURYSM ABDOMINAL AORTIC REPAIR (N/A) LEFT RENAL BYPASS; POSSIBLE RIGHT RENAL BYPASS (Left) as a surgical intervention .  The patient's history has been reviewed, patient examined, no change in status, stable for surgery.  I have reviewed the patient's chart and labs.  Questions were answered to the patient's satisfaction.     Ruta Hinds

## 2015-06-09 NOTE — Progress Notes (Signed)
Pt with some hypertension followed by hypotension after receiving labetalol.  Oliguric currently.  Will bolus with saline 1 liter now and repeat x 1 if still hypotensive.  Has had some atrial and ventricular PAC PVC intraop and post op.  Labwork pending to check electrolytes  Ruta Hinds, MD [facility] Office: [phone] Pager: [phone]

## 2015-06-09 NOTE — Consult Note (Signed)
PULMONARY / CRITICAL CARE MEDICINE   Name: LONAN WESTENSKOW MRN: [id number] DOB: [birth year]    ADMISSION DATE:  06/09/2015 CONSULTATION DATE:  06/09/2015  REFERRING MD:  Vascular Surgery, Dr. Oneida Alar  CHIEF COMPLAINT:  Post op VDRF  HISTORY OF PRESENT ILLNESS:   65 year old male with PMH as below, which is significant HTN, Atrial Fib on warfarin, CVA, and known AAA. He was recently admitted to [facility] ED 1/20 with complaints of abdominal pain. CT abdomen at that time showed evidence of hypoperfusion to the R kidney. CT angio showed some enlargement of the seat of an aneurysm. 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 It was on that day that he presented, and underwent the procedures. Estimated 4L blood loss during case. Post operatively he remains on vent in surgical ICU. Hypotensive. PCCM consulted for vent/medical management.  PAST MEDICAL HISTORY :  He  has a past medical history of Arteriosclerotic cardiovascular disease (ASCVD); Tobacco abuse, in remission; Hypertension; Chronic anticoagulation; Atrial fibrillation (HCC); PVD (peripheral vascular disease) ([place]); Nephrolithiasis; Cerebrovascular disease (2002); Hyperlipidemia; Myocardial infarction ([place]) (10 yrs ago); Testicular carcinoma ([place]) (1990); AAA (abdominal aortic aneurysm) ([place]) (2017); Dysrhythmia; Shortness of breath dyspnea; and GERD (gastroesophageal reflux disease).  PAST SURGICAL HISTORY: He  has past surgical history that includes Appendectomy (2004); testicular cancer (1990); Colonoscopy (06/17/2011);  Esophagogastroduodenoscopy (06/17/2011); Colonoscopy (07/15/2011); Colonoscopy (N/A, 05/22/2015); Esophagogastroduodenoscopy (N/A, 05/22/2015); Esophageal dilation (N/A, 05/22/2015); and Cardiac catheterization (N/A, 05/28/2015).  No Known Allergies  No current facility-administered medications on file prior to encounter.   Current Outpatient Prescriptions on File Prior to Encounter  Medication Sig  . albuterol (PROVENTIL HFA;VENTOLIN HFA) 108 (90 BASE) MCG/ACT inhaler Inhale 2-4 puffs into the lungs every 4 (four) hours as needed for wheezing or shortness of breath.  [name] amLODipine (NORVASC) 10 MG tablet take 1 tablet by mouth once daily  . atorvastatin (LIPITOR) 20 MG tablet Take 1 tablet (20 mg total) by mouth at bedtime.  . cloNIDine (CATAPRES) 0.1 MG tablet Take 1 tablet (0.1 mg total) by mouth 2 (two) times daily.  [name] lisinopril (PRINIVIL,ZESTRIL) 40 MG tablet Take 1 tablet (40 mg total) by mouth daily.  . metoprolol tartrate (LOPRESSOR) 25 MG tablet Take 1 tablet (25 mg total) by mouth 2 (two) times daily.  . Multiple Vitamin (MULITIVITAMIN WITH MINERALS) TABS Take 1 tablet by mouth daily.  . Omega-3 Fatty Acids (FISH OIL) 1000 MG CAPS Take 1,000 mg by mouth daily.   . pantoprazole (PROTONIX) 40 MG tablet Take 40 mg by mouth daily.  [name] warfarin (COUMADIN) 5 MG tablet Take 1 1/2 tablets daily (Patient taking differently: Take 5-7.5 mg by mouth daily. Take one tablet on all days except on Wednesdays and Thursdays take one and one-half tablets)  . diphenhydrAMINE (BENADRYL) 25 MG tablet Take 25 mg by mouth daily as needed for allergies.  . fexofenadine (ALLEGRA) 180 MG tablet Take 1 tablet (180 mg total) by mouth daily.  . Multiple Vitamin (ONE-A-DAY MENS PO) Take 1 tablet by mouth daily.  . nitroGLYCERIN (NITROSTAT) 0.4 MG SL tablet place 1 tablet under the tongue if needed every  5 minutes for chest pain for 3 doses IF NO RELIEF AFTER 3RD DOSE CALL PRESCRIBER OR 911.  . polyethylene  glycol-electrolytes (NULYTELY/GOLYTELY) 420 g solution Take 4,000 mLs by mouth once. (Patient not taking: Reported on 06/03/2015)  . traZODone (DESYREL) 50 MG tablet Take 0.5-1 tablets (25-50 mg total) by mouth at bedtime as needed for sleep. (Patient taking differently: Take 50 mg by mouth at bedtime as needed for sleep. )    FAMILY HISTORY:  His indicated that his mother is deceased. He indicated that his father is deceased. He indicated that his sister is alive. He indicated that his brother is deceased.   SOCIAL HISTORY: He  reports that he quit smoking about 2 weeks ago. His smoking use included Cigarettes. He has a 4 pack-year smoking history. He has never used smokeless tobacco. He reports that he does not drink alcohol or use illicit drugs.  REVIEW OF SYSTEMS:   Unable as patient is encephalopathic and on ventilator.   SUBJECTIVE:    VITAL SIGNS: BP 162/83 mmHg  Pulse 88  Temp(Src) 97.9 F (36.6 C)  Resp 20  Ht 6' (1.829 m)  Wt 91.173 kg (201 lb)  BMI 27.25 kg/m2  SpO2 100%  HEMODYNAMICS:    VENTILATOR SETTINGS:    INTAKE / OUTPUT:    PHYSICAL EXAMINATION: General:  Overweight male in NAD on vent Neuro:  Sedated HEENT:  [place]/AT, PERRL, no JVD Cardiovascular:  RRR, no MRG Lungs:  Clear bilateral breath sounds Abdomen:  Soft, non-distended. +BS Musculoskeletal:  No acute deformity or ROM limitation Skin:  Grossly intact, longitudinal surgical dressing on abdominal midline.   LABS:  BMET  Recent Labs Lab 06/05/15 1600  06/09/15 1400 06/09/15 1435 06/09/15 1441  NA 140  < > 138 141 140  K 4.2  < > 5.9* 5.1 5.1  CL 104  --   --   --   --   CO2 22  --   --   --   --   BUN 12  --   --   --   --   CREATININE 1.27*  --   --   --   --   GLUCOSE 81  --   --   --  163*  < > = values in this interval not displayed.  Electrolytes  Recent Labs Lab 06/05/15 1600  CALCIUM 9.9    CBC  Recent Labs Lab 06/05/15 1600  06/09/15 1400 06/09/15 1435  06/09/15 1441  WBC 8.2  --   --   --   --   HGB 14.0  < > 10.2* 10.9* 11.2*  HCT 40.9  < > 30.0* 32.0* 33.0*  PLT 292  --   --   --   --   < > = values in this interval not displayed.  Coag's  Recent Labs Lab 06/09/15 0604  APTT 33  INR 1.07    Sepsis Markers No results for input(s): LATICACIDVEN, PROCALCITON, O2SATVEN in the last 168 hours.  ABG  Recent Labs Lab 06/09/15 1317 06/09/15 1400 06/09/15 1435  PHART 7.116* 7.296* 7.324*  PCO2ART 51.2* 42.8 43.6  PO2ART 139.0* 151.0* 100.0    Liver Enzymes  Recent Labs Lab 06/05/15 1600  AST 19  ALT 23  ALKPHOS 85  BILITOT 0.3  ALBUMIN 3.6    Cardiac Enzymes No results for input(s): TROPONINI, PROBNP in the last 168 hours.  Glucose No results for input(s): GLUCAP in the last 168 hours.  Imaging No results  found.   STUDIES:  CT abdomen 1/21 > Moderate right renal atrophy with hypoperfusion of the right kidney concerning for vascular compromise. Further evaluation of the right kidney as well as interrogation of the right renal artery with duplex ultrasound or CT angiography recommended. 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 Follow-up recommended. A 3.3 cm infrarenal abdominal aortic aneurysm. CT angio abdomen 1/23 > Enlarging saccular lesion along the left side of the infrarenal abdominal aorta. 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 Critical stenosis or near occlusion of the right renal artery with an atrophic right kidney and delayed contrast enhancement in the right kidney. In addition, there appears to be significant stenosis involving the proximal left renal artery which may represent a combination of atherosclerotic disease and compression from the left aortic saccular lesion. Abdominal aortogram 1/25 > occluded R renal artery, possible source of  pseudoaneurysm on L lateral side of aorta just below renal artery, moderate calcific stenosis and bilateral iliac arteries right greater than left, and diseased suprarenal aorta.  CULTURES:   ANTIBIOTICS: Cefuroxime periop  SIGNIFICANT EVENTS: 2/6 to OR, out on vent.   LINES/TUBES: RIJ PA cath 2/6 >>>  DISCUSSION: 65 year old male with known AAA recently admitted with abdominal pain. Found to have extension of aneurysm and occlusion of R renal artery. Presented 2/6 for surgical repair under Dr. Oneida Alar. Procedure complicated by 4L blood loss and subsequent transfusions. Post operatively he remained on ventilator in ICU. PCCM consulted.  ASSESSMENT / PLAN:  PULMONARY A: VDRF in postoperative setting  P:   Full vent support CXR for ETT placement ABG now Vent bundle PRN albuterol per home regimen  CARDIOVASCULAR A:  Abdominal aortic aneurysm s/p open repair 2/6 Paroxysmal atrial fibrillation (was on Xarelto, transitioned to warfarin prior to surgery, last dose 2/1) H/o CAD, HTN  P:  Telemetry SBP guidelines per vascular surgery  PRN hydralazine, labetalol, metoprolol Holding home amlodipine, clonidine, lisinopril, metoprolol  RENAL A:   Hyperkalemia  P:   Repeat CMP now Correct electrolytes as indicated  GASTROINTESTINAL A:   GERD  P:   NPO Protonix for SUP  HEMATOLOGIC A:   Acute blood loss anemia in post operative setting Chronic anticoagulation in setting Afib  P:  Follow H&H, INR Enoxaparin for VTE ppx Will defer full dose anticoagulation until OK with vascular surgery  INFECTIOUS A:   Surgical prophylaxis  P:   Cefuroxime Trend WBC and fever curve  ENDOCRINE A:   No acute issues  P:   Follow glucose on BMP  NEUROLOGIC A:   Acute encephalopathy in post-operative setting  P:   RASS goal: -1 Precedex infusion for medical sedation PRN morphine, dilaudid for analgesia   FAMILY  - Updates: no family at bedside  -  Inter-disciplinary family meet or Palliative Care meeting due by:  2/12   Georgann Housekeeper, AGACNP-BC [facility] Pager [phone] or [phone]  06/09/2015 4:00 PM   Attending Note:  I have examined patient, reviewed labs, studies and notes. I have discussed the case with Jaclynn Guarneri, and I agree with the data and plans as amended above. Also discussed case with Dr Oneida Alar. 65 yo man with R renal art occlusion now s/p L juxtarenal aneurysmal repair and L renal artery bypass. He received 1700cc cell saver blood, 1u PRBC and volume resuscitation during the case. Now to ICU intubated and  sedated. On my eval he is calm, comfortable. Lungs clear on exam. Small amount of urine in bag. BP 123456 systolic. Anticipate some degree of renal injury and ATN. Will keep him intubated for now, follow BP and renal status. I reviewed CXR > no infiltrates. Assess for SBT and possible extubation on 2/7 depending on overall stability. Independent critical care time is 40 minutes.   Baltazar Apo, MD, PhD 06/09/2015, 4:07 PM [facility] [phone] or if no answer [phone]

## 2015-06-09 NOTE — Op Note (Signed)
Procedure: Repair of juxtarenal abdominal aortic aneurysm, left renal artery bypass  Preoperative diagnosis:  Abdominal aortic aneurysm, left renal artery stenosis  Postoperative diagnosis: Same  Anesthesia: Gen.  Assistant: Gae Gallop M.D.  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 The patient was placed in supine position operating table. After induction of general anesthesia and endotracheal ablation, the patient had a Foley catheter and nasogastric tube placed. Next patient was prepped and draped in usual sterile fashion from the nipples to the knees. A midline laparotomy incision was made extending from the xiphoid down to the pubis. The fascia and peritoneum were incised and the abdomen was entered. There were some omental adhesions to the right lower quadrant. These were taken down with cautery. Next the transverse colon was reflected superiorly and the small bowel reflected to the right an Omni retractor brought up in the operative field and placed for assistance and retraction. The retroperitoneum was entered. The inferior mesenteric artery was identified and dissected free circumferentially and vessel loop placed around this. The left and right common iliac arteries were identified and dissected free on the anterior two thirds surface. There had some posterior plaque but overall were soft on palpation. The inferior mesenteric vein was ligated and divided between silk ties to provide extra exposure. Dissection was then carried up to the level of the left renal vein area did this was dissected free circumferentially. The left renal artery was identified. It was dissected free circumferentially. It was apparent after dissecting out the left  renal artery that the left renal art vein was going to be in the way of our bypass to this. Therefore the left renal vein was divided by clamping it proximally and distally with Henley clamps and an oversewing each and with a 5-0 Prolene. The superior mesenteric artery was identified. The right renal artery was also identified. The right renal artery was dissected free on its anterior two thirds surface. It looked like we had a good area to clamp just below the right renal and above the left renal artery. There was not much room above the renal artery and below the superior mesenteric artery for clamping. The patient was given 9000 units of intravenous heparin. The left and right common iliac arteries were controlled with coarct clamps. The inferior mesenteric artery and left renal artery were controlled with vessel loops. The aorta was clamped just above the left renal and below the right renal artery. The aorta was opened longitudinally adjacent to the inferior mesenteric artery. There were no significant lumbar arteries bleeding. The area of aneurysm was adjacent and just below the left renal artery. Basically there was a hole in the aorta in this location with a large amount of thrombus within it. The proximal aorta was prepared for grafting but the posterior wall was quite thickened with calcified plaque. Therefore I endarterectomized this portion of the aorta. After this the posterior wall was still quite tenuous. A 20 mm Dacron graft was brought up in the operative field and sewn end-to-end to the aorta using a running 3-0 Prolene suture with a circumferential felt pledget. At completion of the anastomosis it was tested and there was bleeding from the posterior wall. Several pledgeted sutures were placed in this area but there was still bleeding apparent. The patient  became hypotensive for a brief period of time in trying to control area of bleeding. It was apparent at this point that there was an area just  above the anastomosis that was bleeding from the previously endarterectomized segment. It was difficult to tell whether or not this was a clamp injury or a portion of the plaque had torn the aorta at this level. There was no room to repair this area with aortic clamp in place. Therefore a Coda balloon was placed through the graft and inflated in the areas. Mesenteric artery for vascular control. 2 sutures were placed in the area of bleeding to obtain hematologic stasis. The posterior wall was then packed with thrombin Gelfoam. Attention was then turned to the distal anastomosis. End-to-end anastomosis was created to the area of aorta just above the bifurcation using a running 3-0 Prolene suture. Just prior completion anastomosis everything was forebled backbled and thoroughly flushed. Anastomosis was secured clamps released first to the left iliac and into the right iliac artery. There was a brief transient pressure drop with each artery opening and this returned with point resuscitation. This point attention was turned to the left renal artery. There was no flow in the left renal by Doppler. This was ligated proximally with 2-0 silk tie and oversewn with a 5-0 Prolene. The artery was transected. There was a small amount of dark blood back bleeding from the left renal artery. A Satinsky clamp was used to side bite the anterolateral wall of the new graft. Small opening was made in the graft with an 11 blade and then this widened with a 5 mm aortic punch. A 6 mm Dacron graft was brought up in the operative field and beveled slightly and sewn end graft to side of graft using a running 5-0 Prolene suture. The anastomosis was completed and tested and found to be hemostatic. The graft was then cut to length and the distal anastomosis to the renal artery was spatulated. These were then sewn end-to-end to the left renal artery using a running 6-0 Prolene suture. Just prior completion anastomosis was forebled backbled and  thoroughly flushed. Anastomosis was secured clamps released and there was good Doppler flow in the left renal artery immediately. One repair suture was placed. At this point the inferior mesenteric artery was inspected and there was brisk backbleeding from this. This was ligated with a 2-0 silk tie. Hemostasis was obtained with the assistance of 100 mg protamine. (The patient had been given a total dose of about 20,000 units of heparin during the case). The abdomen was inspected and found to be reasonably hemostatic. The retroperitoneum and portions of the aortic wall were closed over the Dacron graft. The Omni retractor was removed and the viscera returned to their normal position. The small bowel and large intestine were inspected and found to have good pink well-perfused color. The fascia was reapproximated using a running #1 PDS suture. The skin was closed staples. Patient was found to have Doppler signals in the feet at the conclusion of the case. Except for the episode of hypotension when repairing the proximal anastomosis the patient was overall stable during the case and tolerated the procedure well. There was some urine output at the conclusion of the case of approximately 10 cc per hour. The patient was taken to the intensive care unit on a ventilator in critical but stable condition. The instrument sponge and needle count was correct in the case.  Ruta Hinds, MD [facility] Office:  [phone] Pager: [phone]

## 2015-06-09 NOTE — H&P (View-Only) (Signed)
Pt angiogram reviewed.  I believe the best option for repair will be open repair of his infrarenal aorta with left renal artery bypass and possible right renal artery bypass.  Images and case also reviewed with several of my partners who agree.  Plan will be to do this Monday, February 6.  He will need to stop his coumadin on February 1.  Would recheck his creatinine tomorrow morning post angio and if stable ok for d/c home with resuming prior coumadin dose.   Risk benefits possible complications discussed with pt including bleeding infection renal failure requiring permanent or temporary dialysis, transfusion (20%), myocardial events (5%), death < 5%.  He understands and wishes to proceed.  Ruta Hinds, MD [facility] Office: [phone] Pager: [phone]

## 2015-06-09 NOTE — Anesthesia Procedure Notes (Addendum)
Central Venous Catheter Insertion Performed by: anesthesiologist 06/09/2015 6:55 AM Patient location: Pre-op. Preanesthetic checklist: patient identified, IV checked, site marked, risks and benefits discussed, surgical consent, monitors and equipment checked, pre-op evaluation, timeout performed and anesthesia consent Position: Trendelenburg Lidocaine 1% used for infiltration Landmarks identified and Seldinger technique used Catheter size: 8.5 Fr Central line and PA cath was placed.Sheath introducer Swan type and PA catheter depth:thermodilution and 50PA Cath depth:50 Procedure performed using ultrasound guided technique. Attempts: 1 Following insertion, line sutured and dressing applied. Post procedure assessment: blood return through all ports, free fluid flow and no air. Patient tolerated the procedure well with no immediate complications.   Procedure Name: Intubation Date/Time: 06/09/2015 7:52 AM Performed by: Lance Coon Pre-anesthesia Checklist: Patient identified, Emergency Drugs available, Suction available, Patient being monitored and Timeout performed Patient Re-evaluated:Patient Re-evaluated prior to inductionOxygen Delivery Method: Circle system utilized Preoxygenation: Pre-oxygenation with 100% oxygen Intubation Type: Inhalational induction Ventilation: Oral airway inserted - appropriate to patient size and Mask ventilation without difficulty Laryngoscope Size: Miller and 2 Grade View: Grade I Tube type: Subglottic suction tube Tube size: 7.5 mm Number of attempts: 1 Airway Equipment and Method: Stylet Placement Confirmation: ETT inserted through vocal cords under direct vision,  positive ETCO2 and breath sounds checked- equal and bilateral Secured at: 22 cm Tube secured with: Tape

## 2015-06-09 NOTE — Transfer of Care (Signed)
Immediate Anesthesia Transfer of Care Note  Patient: [name] [name]  Procedure(s) Performed: Procedure(s): ANEURYSM ABDOMINAL AORTIC REPAIR (N/A) LEFT RENAL Artery BYPASS (Left) AORTIC ENDARTERECETOMY (N/A)  Patient Location: SICU  Anesthesia Type:General  Level of Consciousness: sedated, unresponsive and Patient remains intubated per anesthesia plan  Airway & Oxygen Therapy: Patient remains intubated per anesthesia plan and Patient placed on Ventilator (see vital sign flow sheet for setting)  Post-op Assessment: Report given to RN, Post -op Vital signs reviewed and stable and Patient moving all extremities X 4  Post vital signs: Reviewed and stable  Last Vitals:  Filed Vitals:   06/09/15 0559 06/09/15 0603  BP: 162/83   Pulse: 88   Temp:  36.6 C  Resp: 20     Complications: No apparent anesthesia complications

## 2015-06-09 NOTE — Progress Notes (Signed)
At 19:00, pt PAD was consistently around 12 and CI was <1.8, so began to give a 1L bolus per previous order. After 571mL of bolus given, SBP increased to 150-160s and CI was 1.93, so bolus was stopped. CVP consistently 8-9 now.  Pt able to move all extremities and open eyes on command as of 20:00.   No immediate concerns, will continue to monitor.  Henreitta Leber, RN 9:41 PM 06/09/2015

## 2015-06-09 NOTE — Anesthesia Postprocedure Evaluation (Signed)
Anesthesia Post Note  Patient: [name] [name]  Procedure(s) Performed: Procedure(s) (LRB): ANEURYSM ABDOMINAL AORTIC REPAIR (N/A) LEFT RENAL Artery BYPASS (Left) AORTIC ENDARTERECETOMY (N/A)  Patient location during evaluation: SICU Anesthesia Type: General Level of consciousness: patient remains intubated per anesthesia plan and sedated Pain management: pain level controlled Vital Signs Assessment: post-procedure vital signs reviewed and stable Respiratory status: patient on ventilator - see flowsheet for VS and patient remains intubated per anesthesia plan Cardiovascular status: blood pressure returned to baseline and stable Anesthetic complications: no    Last Vitals:  Filed Vitals:   06/09/15 0559 06/09/15 0603  BP: 162/83   Pulse: 88   Temp:  36.6 C  Resp: 20     Last Pain: There were no vitals filed for this visit.               Marcene Laskowski EDWARD

## 2015-06-09 NOTE — Anesthesia Preprocedure Evaluation (Addendum)
Anesthesia Evaluation  Patient identified by MRN, date of birth, ID band Patient awake    Reviewed: Allergy & Precautions, NPO status , Patient's Chart, lab work & pertinent test results  Airway Mallampati: I  TM Distance: >3 FB     Dental   Pulmonary COPD, former smoker,     + decreased breath sounds      Cardiovascular hypertension, + CAD, + Past MI and + Peripheral Vascular Disease  + dysrhythmias Atrial Fibrillation  Rhythm:Irregular Rate:Abnormal     Neuro/Psych    GI/Hepatic hiatal hernia, GERD  ,  Endo/Other    Renal/GU Renal disease     Musculoskeletal   Abdominal   Peds  Hematology   Anesthesia Other Findings   Reproductive/Obstetrics                            Anesthesia Physical Anesthesia Plan  ASA: III  Anesthesia Plan: General   Post-op Pain Management:    Induction: Intravenous  Airway Management Planned: Oral ETT  Additional Equipment: Arterial line, CVP and PA Cath  Intra-op Plan:   Post-operative Plan: Possible Post-op intubation/ventilation  Informed Consent: I have reviewed the patients History and Physical, chart, labs and discussed the procedure including the risks, benefits and alternatives for the proposed anesthesia with the patient or authorized representative who has indicated his/her understanding and acceptance.     Plan Discussed with: Anesthesiologist, CRNA and Surgeon  Anesthesia Plan Comments:         Anesthesia Quick Evaluation

## 2015-06-10 ENCOUNTER — Inpatient Hospital Stay (HOSPITAL_COMMUNITY): Payer: Medicare Other

## 2015-06-10 ENCOUNTER — Encounter (HOSPITAL_COMMUNITY): Payer: Self-pay | Admitting: Vascular Surgery

## 2015-06-10 DIAGNOSIS — N179 Acute kidney failure, unspecified: Secondary | ICD-10-CM | POA: Diagnosis not present

## 2015-06-10 DIAGNOSIS — N189 Chronic kidney disease, unspecified: Secondary | ICD-10-CM

## 2015-06-10 DIAGNOSIS — E872 Acidosis: Secondary | ICD-10-CM

## 2015-06-10 LAB — TYPE AND SCREEN
ABO/RH(D): B NEG
Antibody Screen: NEGATIVE
Unit division: 0
Unit division: 0
Unit division: 0
Unit division: 0
Unit division: 0
Unit division: 0

## 2015-06-10 LAB — PREPARE FRESH FROZEN PLASMA
Unit division: 0
Unit division: 0

## 2015-06-10 LAB — POCT I-STAT, CHEM 8
BUN: 39 mg/dL — ABNORMAL HIGH (ref 6–20)
Calcium, Ion: 1.03 mmol/L — ABNORMAL LOW (ref 1.13–1.30)
Chloride: 107 mmol/L (ref 101–111)
Creatinine, Ser: 4.5 mg/dL — ABNORMAL HIGH (ref 0.61–1.24)
Glucose, Bld: 152 mg/dL — ABNORMAL HIGH (ref 65–99)
HCT: 36 % — ABNORMAL LOW (ref 39.0–52.0)
Hemoglobin: 12.2 g/dL — ABNORMAL LOW (ref 13.0–17.0)
Potassium: 5.2 mmol/L — ABNORMAL HIGH (ref 3.5–5.1)
Sodium: 141 mmol/L (ref 135–145)
TCO2: 18 mmol/L (ref 0–100)

## 2015-06-10 LAB — PROTIME-INR
INR: 1.39 (ref 0.00–1.49)
Prothrombin Time: 17.2 seconds — ABNORMAL HIGH (ref 11.6–15.2)

## 2015-06-10 LAB — COMPREHENSIVE METABOLIC PANEL
ALT: 71 U/L — ABNORMAL HIGH (ref 17–63)
AST: 172 U/L — ABNORMAL HIGH (ref 15–41)
Albumin: 2.2 g/dL — ABNORMAL LOW (ref 3.5–5.0)
Alkaline Phosphatase: 39 U/L (ref 38–126)
Anion gap: 16 — ABNORMAL HIGH (ref 5–15)
BUN: 27 mg/dL — ABNORMAL HIGH (ref 6–20)
CO2: 17 mmol/L — ABNORMAL LOW (ref 22–32)
Calcium: 7.8 mg/dL — ABNORMAL LOW (ref 8.9–10.3)
Chloride: 108 mmol/L (ref 101–111)
Creatinine, Ser: 3.85 mg/dL — ABNORMAL HIGH (ref 0.61–1.24)
GFR calc Af Amer: 18 mL/min — ABNORMAL LOW (ref >60)
GFR calc non Af Amer: 15 mL/min — ABNORMAL LOW (ref >60)
Glucose, Bld: 158 mg/dL — ABNORMAL HIGH (ref 65–99)
Potassium: 5.7 mmol/L — ABNORMAL HIGH (ref 3.5–5.1)
Sodium: 141 mmol/L (ref 135–145)
Total Bilirubin: 1 mg/dL (ref 0.3–1.2)
Total Protein: 4.3 g/dL — ABNORMAL LOW (ref 6.5–8.1)

## 2015-06-10 LAB — CBC
HCT: 39.5 % (ref 39.0–52.0)
Hemoglobin: 12.8 g/dL — ABNORMAL LOW (ref 13.0–17.0)
MCH: 28 pg (ref 26.0–34.0)
MCHC: 32.4 g/dL (ref 30.0–36.0)
MCV: 86.4 fL (ref 78.0–100.0)
Platelets: 88 10*3/uL — ABNORMAL LOW (ref 150–400)
RBC: 4.57 MIL/uL (ref 4.22–5.81)
RDW: 16.3 % — ABNORMAL HIGH (ref 11.5–15.5)
WBC: 19.4 10*3/uL — ABNORMAL HIGH (ref 4.0–10.5)

## 2015-06-10 LAB — POCT I-STAT 3, ART BLOOD GAS (G3+)
Acid-base deficit: 9 mmol/L — ABNORMAL HIGH (ref 0.0–2.0)
Bicarbonate: 15.3 mEq/L — ABNORMAL LOW (ref 20.0–24.0)
O2 Saturation: 91 %
Patient temperature: 37.7
TCO2: 16 mmol/L (ref 0–100)
pCO2 arterial: 30.2 mmHg — ABNORMAL LOW (ref 35.0–45.0)
pH, Arterial: 7.315 — ABNORMAL LOW (ref 7.350–7.450)
pO2, Arterial: 67 mmHg — ABNORMAL LOW (ref 80.0–100.0)

## 2015-06-10 LAB — MAGNESIUM: Magnesium: 1.3 mg/dL — ABNORMAL LOW (ref 1.7–2.4)

## 2015-06-10 LAB — AMYLASE: Amylase: 225 U/L — ABNORMAL HIGH (ref 28–100)

## 2015-06-10 MED ORDER — LIDOCAINE HCL (PF) 1 % IJ SOLN
INTRAMUSCULAR | Status: DC
Start: 2015-06-10 — End: 2015-06-10
  Filled 2015-06-10: qty 10

## 2015-06-10 MED ORDER — MAGNESIUM SULFATE IN D5W 10-5 MG/ML-% IV SOLN
1.0000 g | Freq: Once | INTRAVENOUS | Status: AC
  Administered 2015-06-10: 1 g via INTRAVENOUS
  Filled 2015-06-10: qty 100

## 2015-06-10 MED ORDER — SODIUM CHLORIDE 0.9 % IV SOLN
INTRAVENOUS | Status: DC
  Administered 2015-06-10 – 2015-06-13 (×3): via INTRAVENOUS
  Administered 2015-06-16: 10 mL/h via INTRAVENOUS
  Administered 2015-06-21: 11:00:00 via INTRAVENOUS

## 2015-06-10 MED ORDER — SODIUM CHLORIDE 0.9 % IV BOLUS (SEPSIS)
1000.0000 mL | Freq: Once | INTRAVENOUS | Status: AC
  Administered 2015-06-10: 1000 mL via INTRAVENOUS

## 2015-06-10 MED ORDER — ENOXAPARIN SODIUM 30 MG/0.3ML ~~LOC~~ SOLN
30.0000 mg | SUBCUTANEOUS | Status: DC
  Administered 2015-06-11 – 2015-06-30 (×20): 30 mg via SUBCUTANEOUS
  Filled 2015-06-10 (×21): qty 0.3

## 2015-06-10 MED ORDER — FUROSEMIDE 10 MG/ML IJ SOLN
160.0000 mg | Freq: Four times a day (QID) | INTRAVENOUS | Status: DC
  Administered 2015-06-11 – 2015-06-12 (×6): 160 mg via INTRAVENOUS
  Filled 2015-06-10 (×10): qty 16

## 2015-06-10 MED ORDER — MAGNESIUM SULFATE 50 % IJ SOLN: 1.0000 g | Freq: Once | INTRAMUSCULAR | Status: DC

## 2015-06-10 MED ORDER — HEPARIN 1000 UNIT/ML FOR PERITONEAL DIALYSIS
2.8000 mL | INTRAMUSCULAR | Status: DC | PRN
  Administered 2015-06-10: 2800 [IU] via INTRAVENOUS_CENTRAL
  Filled 2015-06-10 (×3): qty 2.8

## 2015-06-10 MED ORDER — METOPROLOL TARTRATE 1 MG/ML IV SOLN
5.0000 mg | Freq: Four times a day (QID) | INTRAVENOUS | Status: DC
  Administered 2015-06-11 (×3): 5 mg via INTRAVENOUS
  Filled 2015-06-10 (×7): qty 5

## 2015-06-10 MED ORDER — STERILE WATER FOR INJECTION IV SOLN
INTRAVENOUS | Status: DC
  Administered 2015-06-10 – 2015-06-11 (×2): via INTRAVENOUS
  Filled 2015-06-10 (×3): qty 850

## 2015-06-10 NOTE — Consult Note (Signed)
[facility] Consult Note     Date: 06/10/2015                  Patient Name:  [name] [name]  MRN: [id number]  DOB: 1951-01-22  Age / Sex: 65 y.o., male         PCP: Vic Blackbird, MD                 Service Requesting Consult: Vascular surgery                  Reason for Consult: AKI, oliguria             Chief Complaint: abdominal pain HPI:  Ms. Deleo is a 65 y/o Caucasian male with a  PMHx atrial fibrillation, HTN, HLD, CAD, nephrolithiasis, present for a repair of a juxtarenal abdominal aortic aneurysm and left renal artery bypass with subsequent AKI and oliguria.  On 1/21 he presented to the ED with abdominal pain after being off Coumadin for a colonoscopy, at that time his Cr was 1.38. 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 He had a slight increase in his Cr to 1.45 at that time. Aortagram revealed an occulded R renal artery, possible source of pseudoaneurysm on left lateral side of aorta just below the renal artery, and diseased suprarenal aorta. On 06/09/15 he underwent repair of juxtarenal abdominal aortic aneurysm and left renal artery bypass, during that time there was a spell of hypotension due to 4L blood loss requiring transfusions. Additionally, post-operatively he had hypertension treated with labetalol and then became hypotensive once again with improvement with IVFs.   Patient is currently intubated and I am unable to gain a appropriate HPI from him.   Past Medical History   Diagnosis Date  . Arteriosclerotic cardiovascular disease (ASCVD)     AMI in 2000 treated at [facility]; cath in 12/2006->  Chronic total obstruction of the RCA;  drug-eluting stent placed in M1; inferior hypokinesis with an EF of 45%  . Tobacco abuse, in remission     20 pack years; quit in 2009  . Hypertension   . Chronic anticoagulation   . Atrial fibrillation ([place])     on coumadin for couple of years, stopped plavix at that time  . PVD (peripheral vascular disease) ([place])     Ct angiogram in 2009 revealed stable disease with 80% celiac stenosis,50% right renal artery ,ASCVD with ulceration in the abdominal aortashe  . Nephrolithiasis   . Cerebrovascular disease 2002    carotid stent  . Hyperlipidemia   . Myocardial infarction ([place]) 10 yrs ago  . Testicular carcinoma ([place]) 1990    right orchiectomy  . AAA (abdominal aortic aneurysm) ([place]) 2017    3.3cm  . Dysrhythmia   . Shortness of breath dyspnea     'sometimes'  . GERD (gastroesophageal reflux disease)     Past Surgical History  Procedure Laterality Date  . Appendectomy  2004  . Testicular cancer  1990    right orchiectomy  . Colonoscopy  06/17/2011    INCOMPLETE, PREP POOR. Procedure: COLONOSCOPY;  Surgeon: Robert M Rourk, MD;  Location: [facility];  Service: Endoscopy;  Laterality: N/A;  10:00  . Esophagogastroduodenoscopy  06/17/2011    severe erosive/ulcerative reflux esophagitis, soft noncritical stricture dilatied, small hh, antral erosion   . Colonoscopy  07/15/2011    RMR:multiple rectal and colon polyps  . Colonoscopy N/A 05/22/2015    Procedure: COLONOSCOPY;  Surgeon: Robert M Rourk, MD;  Location: [facility];  Service: Endoscopy;  Laterality: N/A;  1300 - moved to 2:30 - office to notify  . Esophagogastroduodenoscopy N/A 05/22/2015    Procedure: ESOPHAGOGASTRODUODENOSCOPY (EGD);  Surgeon: Robert M Rourk, MD;  Location: [facility];  Service: Endoscopy;  Laterality: N/A;  . Esophageal dilation N/A 05/22/2015     Procedure: ESOPHAGEAL DILATION;  Surgeon: Robert M Rourk, MD;  Location: [facility];  Service: Endoscopy;  Laterality: N/A;  . Peripheral vascular catheterization N/A 05/28/2015    Procedure: Abdominal Aortogram;  Surgeon: Vance W Brabham, MD;  Location: [facility];  Service: Cardiovascular;  Laterality: N/A;    Family History  Problem Relation Age of Onset  . Colon cancer Father 70    deceased  . Prostate cancer Father   . Liver disease Neg Hx   . Anesthesia problems Neg Hx   . Hypotension Neg Hx   . Malignant hyperthermia Neg Hx   . Pseudochol deficiency Neg Hx    Social History:  reports that he quit smoking about 2 weeks ago. His smoking use included Cigarettes. He has a 4 pack-year smoking history. He has never used smokeless tobacco. He reports that he does not drink alcohol or use illicit drugs.  Allergies: No Known Allergies  Medications Prior to Admission  Medication Sig Dispense Refill  . albuterol (PROVENTIL HFA;VENTOLIN HFA) 108 (90 BASE) MCG/ACT inhaler Inhale 2-4 puffs into the lungs every 4 (four) hours as needed for wheezing or shortness of breath. 1 Inhaler 0  . amLODipine (NORVASC) 10 MG tablet take 1 tablet by mouth once daily 90 tablet 3  . atorvastatin (LIPITOR) 20 MG tablet Take 1 tablet (20 mg total) by mouth at bedtime. 30 tablet 3  . cloNIDine (CATAPRES) 0.1 MG tablet Take 1 tablet (0.1 mg total) by mouth 2 (two) times daily. 180 tablet 3  . lisinopril (PRINIVIL,ZESTRIL) 40 MG tablet Take 1 tablet (40 mg total) by mouth daily. 90 tablet 3  . metoprolol tartrate (LOPRESSOR) 25 MG tablet Take 1 tablet (25 mg total) by mouth 2 (two) times daily. 60 tablet 0  . Multiple Vitamin (MULITIVITAMIN WITH MINERALS) TABS Take 1 tablet by mouth daily.    . Omega-3 Fatty Acids (FISH OIL) 1000 MG CAPS Take 1,000 mg by mouth daily.   0  . pantoprazole (PROTONIX) 40 MG tablet Take 40 mg by mouth daily.    . warfarin (COUMADIN) 5 MG tablet Take 1 1/2 tablets daily  (Patient taking differently: Take 5-7.5 mg by mouth daily. Take one tablet on all days except on Wednesdays and Thursdays take one and one-half tablets) 45 tablet 6  . diphenhydrAMINE (BENADRYL) 25 MG tablet Take 25 mg by mouth daily as needed for allergies.    . fexofenadine (ALLEGRA) 180 MG tablet Take 1 tablet (180 mg total) by mouth daily. 30 tablet 2  . Multiple Vitamin (ONE-A-DAY MENS PO) Take 1 tablet by mouth daily.    . nitroGLYCERIN (NITROSTAT) 0.4 MG SL tablet place 1 tablet under the tongue if   needed every 5 minutes for chest pain for 3 doses IF NO RELIEF AFTER 3RD DOSE CALL PRESCRIBER OR 911. 25 tablet 2  . polyethylene glycol-electrolytes (NULYTELY/GOLYTELY) 420 g solution Take 4,000 mLs by mouth once. (Patient not taking: Reported on 06/03/2015) 4000 mL 0  . traZODone (DESYREL) 50 MG tablet Take 0.5-1 tablets (25-50 mg total) by mouth at bedtime as needed for sleep. (Patient taking differently: Take 50 mg by mouth at bedtime as needed for sleep. ) 30 tablet 3    Results for orders placed or performed during the hospital encounter of 06/09/15 (from the past 48 hour(s))  Blood gas, arterial     Status: Abnormal   Collection Time: 06/09/15  6:04 AM  Result Value Ref Range   FIO2 0.21    pH, Arterial 7.396 7.350 - 7.450   pCO2 arterial 39.7 35.0 - 45.0 mmHg   pO2, Arterial 43.8 (L) 80.0 - 100.0 mmHg   Bicarbonate 23.9 20.0 - 24.0 mEq/L   TCO2 25.1 0 - 100 mmol/L   Acid-base deficit 0.4 0.0 - 2.0 mmol/L   O2 Saturation 78.4 %   Patient temperature 98.6    Collection site ARTERIAL DRAW    Drawn by [id number]    Sample type ARTERIAL DRAW    Allens test (pass/fail) PASS PASS  APTT     Status: None   Collection Time: 06/09/15  6:04 AM  Result Value Ref Range   aPTT 33 24 - 37 seconds  Protime-INR     Status: None   Collection Time: 06/09/15  6:04 AM  Result Value Ref Range   Prothrombin Time 14.1 11.6 - 15.2 seconds   INR 1.07 0.00 - 1.49  Prepare RBC     Status: None    Collection Time: 06/09/15  7:06 AM  Result Value Ref Range   Order Confirmation ORDER PROCESSED BY BLOOD BANK   I-STAT 7, (LYTES, BLD GAS, ICA, H+H)     Status: Abnormal   Collection Time: 06/09/15  8:38 AM  Result Value Ref Range   pH, Arterial 7.416 7.350 - 7.450   pCO2 arterial 36.7 35.0 - 45.0 mmHg   pO2, Arterial 184.0 (H) 80.0 - 100.0 mmHg   Bicarbonate 23.9 20.0 - 24.0 mEq/L   TCO2 25 0 - 100 mmol/L   O2 Saturation 100.0 %   Acid-base deficit 1.0 0.0 - 2.0 mmol/L   Sodium 139 135 - 145 mmol/L   Potassium 4.4 3.5 - 5.1 mmol/L   Calcium, Ion 1.23 1.13 - 1.30 mmol/L   HCT 34.0 (L) 39.0 - 52.0 %   Hemoglobin 11.6 (L) 13.0 - 17.0 g/dL   Patient temperature 36.0 C    Sample type ARTERIAL   I-STAT 7, (LYTES, BLD GAS, ICA, H+H)     Status: Abnormal   Collection Time: 06/09/15 12:00 PM  Result Value Ref Range   pH, Arterial 7.339 (L) 7.350 - 7.450   pCO2 arterial 35.0 35.0 - 45.0 mmHg   pO2, Arterial 161.0 (H) 80.0 - 100.0 mmHg   Bicarbonate 18.8 (L) 20.0 - 24.0 mEq/L   TCO2 20 0 - 100 mmol/L   O2 Saturation 99.0 %   Acid-base deficit 6.0 (H) 0.0 - 2.0 mmol/L   Sodium 136 135 - 145 mmol/L   Potassium 5.5 (H) 3.5 - 5.1 mmol/L   Calcium, Ion 1.09 (L) 1.13 - 1.30 mmol/L   HCT 30.0 (L) 39.0 - 52.0 %   Hemoglobin 10.2 (L) 13.0 - 17.0 g/dL   Patient temperature  37.0 C    Sample type ARTERIAL   Prepare fresh frozen plasma     Status: None   Collection Time: 06/09/15 12:28 PM  Result Value Ref Range   Unit Number [id number]    Blood Component Type THAWED PLASMA    Unit division 00    Status of Unit ISSUED,FINAL    Transfusion Status OK TO TRANSFUSE    Unit Number [id number]    Blood Component Type THAWED PLASMA    Unit division 00    Status of Unit ISSUED,FINAL    Transfusion Status OK TO TRANSFUSE   Prepare RBC     Status: None   Collection Time: 06/09/15 12:29 PM  Result Value Ref Range   Order Confirmation ORDER PROCESSED BY BLOOD BANK   I-STAT 7, (LYTES, BLD GAS,  ICA, H+H)     Status: Abnormal   Collection Time: 06/09/15 12:41 PM  Result Value Ref Range   pH, Arterial 7.250 (L) 7.350 - 7.450   pCO2 arterial 38.0 35.0 - 45.0 mmHg   pO2, Arterial 108.0 (H) 80.0 - 100.0 mmHg   Bicarbonate 16.6 (L) 20.0 - 24.0 mEq/L   TCO2 18 0 - 100 mmol/L   O2 Saturation 97.0 %   Acid-base deficit 10.0 (H) 0.0 - 2.0 mmol/L   Sodium 135 135 - 145 mmol/L   Potassium 6.1 (HH) 3.5 - 5.1 mmol/L   Calcium, Ion 1.70 (H) 1.13 - 1.30 mmol/L   HCT 37.0 (L) 39.0 - 52.0 %   Hemoglobin 12.6 (L) 13.0 - 17.0 g/dL   Patient temperature 37.5 C    Sample type ARTERIAL   I-STAT 7, (LYTES, BLD GAS, ICA, H+H)     Status: Abnormal   Collection Time: 06/09/15  1:17 PM  Result Value Ref Range   pH, Arterial 7.116 (LL) 7.350 - 7.450   pCO2 arterial 51.2 (H) 35.0 - 45.0 mmHg   pO2, Arterial 139.0 (H) 80.0 - 100.0 mmHg   Bicarbonate 16.5 (L) 20.0 - 24.0 mEq/L   TCO2 18 0 - 100 mmol/L   O2 Saturation 98.0 %   Acid-base deficit 13.0 (H) 0.0 - 2.0 mmol/L   Sodium 138 135 - 145 mmol/L   Potassium 6.6 (HH) 3.5 - 5.1 mmol/L   Calcium, Ion 1.18 1.13 - 1.30 mmol/L   HCT 35.0 (L) 39.0 - 52.0 %   Hemoglobin 11.9 (L) 13.0 - 17.0 g/dL   Patient temperature HIDE    Sample type ARTERIAL   I-STAT 7, (LYTES, BLD GAS, ICA, H+H)     Status: Abnormal   Collection Time: 06/09/15  2:00 PM  Result Value Ref Range   pH, Arterial 7.296 (L) 7.350 - 7.450   pCO2 arterial 42.8 35.0 - 45.0 mmHg   pO2, Arterial 151.0 (H) 80.0 - 100.0 mmHg   Bicarbonate 20.9 20.0 - 24.0 mEq/L   TCO2 22 0 - 100 mmol/L   O2 Saturation 99.0 %   Acid-base deficit 5.0 (H) 0.0 - 2.0 mmol/L   Sodium 138 135 - 145 mmol/L   Potassium 5.9 (H) 3.5 - 5.1 mmol/L   Calcium, Ion 1.26 1.13 - 1.30 mmol/L   HCT 30.0 (L) 39.0 - 52.0 %   Hemoglobin 10.2 (L) 13.0 - 17.0 g/dL   Patient temperature HIDE    Sample type ARTERIAL   I-STAT 7, (LYTES, BLD GAS, ICA, H+H)     Status: Abnormal   Collection Time: 06/09/15  2:35 PM  Result Value  Ref Range   pH, Arterial 7.324 (  L) 7.350 - 7.450   pCO2 arterial 43.6 35.0 - 45.0 mmHg   pO2, Arterial 100.0 80.0 - 100.0 mmHg   Bicarbonate 22.7 20.0 - 24.0 mEq/L   TCO2 24 0 - 100 mmol/L   O2 Saturation 97.0 %   Acid-base deficit 3.0 (H) 0.0 - 2.0 mmol/L   Sodium 141 135 - 145 mmol/L   Potassium 5.1 3.5 - 5.1 mmol/L   Calcium, Ion 1.19 1.13 - 1.30 mmol/L   HCT 32.0 (L) 39.0 - 52.0 %   Hemoglobin 10.9 (L) 13.0 - 17.0 g/dL   Patient temperature 37.0 C    Sample type ARTERIAL   I-STAT 4, (NA,K, GLUC, HGB,HCT)     Status: Abnormal   Collection Time: 06/09/15  2:41 PM  Result Value Ref Range   Sodium 140 135 - 145 mmol/L   Potassium 5.1 3.5 - 5.1 mmol/L   Glucose, Bld 163 (H) 65 - 99 mg/dL   HCT 33.0 (L) 39.0 - 52.0 %   Hemoglobin 11.2 (L) 13.0 - 17.0 g/dL  I-STAT 3, arterial blood gas (G3+)     Status: Abnormal   Collection Time: 06/09/15  3:43 PM  Result Value Ref Range   pH, Arterial 7.299 (L) 7.350 - 7.450   pCO2 arterial 54.1 (H) 35.0 - 45.0 mmHg   pO2, Arterial 311.0 (H) 80.0 - 100.0 mmHg   Bicarbonate 26.4 (H) 20.0 - 24.0 mEq/L   TCO2 28 0 - 100 mmol/L   O2 Saturation 100.0 %   Patient temperature 37.4 C    Collection site ARTERIAL LINE    Drawn by RT    Sample type ARTERIAL   Protime-INR     Status: Abnormal   Collection Time: 06/09/15  4:55 PM  Result Value Ref Range   Prothrombin Time 17.3 (H) 11.6 - 15.2 seconds   INR 1.40 0.00 - 1.49  APTT     Status: None   Collection Time: 06/09/15  4:55 PM  Result Value Ref Range   aPTT 36 24 - 37 seconds  Magnesium     Status: Abnormal   Collection Time: 06/09/15  4:55 PM  Result Value Ref Range   Magnesium 1.6 (L) 1.7 - 2.4 mg/dL  Comprehensive metabolic panel     Status: Abnormal   Collection Time: 06/09/15  4:55 PM  Result Value Ref Range   Sodium 137 135 - 145 mmol/L   Potassium 5.3 (H) 3.5 - 5.1 mmol/L   Chloride 102 101 - 111 mmol/L   CO2 24 22 - 32 mmol/L   Glucose, Bld 124 (H) 65 - 99 mg/dL   BUN 18 6 - 20  mg/dL   Creatinine, Ser 2.34 (H) 0.61 - 1.24 mg/dL   Calcium 8.4 (L) 8.9 - 10.3 mg/dL   Total Protein 4.8 (L) 6.5 - 8.1 g/dL   Albumin 2.8 (L) 3.5 - 5.0 g/dL   AST 67 (H) 15 - 41 U/L   ALT 27 17 - 63 U/L   Alkaline Phosphatase 42 38 - 126 U/L   Total Bilirubin 1.4 (H) 0.3 - 1.2 mg/dL   GFR calc non Af Amer 28 (L) >60 mL/min   GFR calc Af Amer 33 (L) >60 mL/min    Comment: (NOTE) The eGFR has been calculated using the CKD EPI equation. This calculation has not been validated in all clinical situations. eGFR's persistently <60 mL/min signify possible Chronic Kidney Disease.    Anion gap 11 5 - 15  CBC     Status:   Abnormal   Collection Time: 06/09/15  4:55 PM  Result Value Ref Range   WBC 17.7 (H) 4.0 - 10.5 K/uL   RBC 4.51 4.22 - 5.81 MIL/uL   Hemoglobin 12.7 (L) 13.0 - 17.0 g/dL   HCT 38.5 (L) 39.0 - 52.0 %   MCV 85.4 78.0 - 100.0 fL   MCH 28.2 26.0 - 34.0 pg   MCHC 33.0 30.0 - 36.0 g/dL   RDW 16.1 (H) 11.5 - 15.5 %   Platelets 99 (L) 150 - 400 K/uL    Comment: REPEATED TO VERIFY PLATELET COUNT CONFIRMED BY SMEAR   Glucose, capillary     Status: Abnormal   Collection Time: 06/09/15  4:56 PM  Result Value Ref Range   Glucose-Capillary 106 (H) 65 - 99 mg/dL  I-STAT 3, venous blood gas (G3P V)     Status: Abnormal   Collection Time: 06/09/15  4:58 PM  Result Value Ref Range   pH, Ven 7.367 (H) 7.250 - 7.300   pCO2, Ven 41.4 (L) 45.0 - 50.0 mmHg   pO2, Ven 59.0 (H) 30.0 - 45.0 mmHg   Bicarbonate 23.8 20.0 - 24.0 mEq/L   TCO2 25 0 - 100 mmol/L   O2 Saturation 89.0 %   Acid-base deficit 2.0 0.0 - 2.0 mmol/L   Patient temperature HIDE    Sample type VENOUS   CBC     Status: Abnormal   Collection Time: 06/10/15  4:40 AM  Result Value Ref Range   WBC 19.4 (H) 4.0 - 10.5 K/uL   RBC 4.57 4.22 - 5.81 MIL/uL   Hemoglobin 12.8 (L) 13.0 - 17.0 g/dL   HCT 39.5 39.0 - 52.0 %   MCV 86.4 78.0 - 100.0 fL   MCH 28.0 26.0 - 34.0 pg   MCHC 32.4 30.0 - 36.0 g/dL   RDW 16.3 (H) 11.5  - 15.5 %   Platelets 88 (L) 150 - 400 K/uL    Comment: CONSISTENT WITH PREVIOUS RESULT  Comprehensive metabolic panel     Status: Abnormal   Collection Time: 06/10/15  4:40 AM  Result Value Ref Range   Sodium 141 135 - 145 mmol/L   Potassium 5.7 (H) 3.5 - 5.1 mmol/L   Chloride 108 101 - 111 mmol/L   CO2 17 (L) 22 - 32 mmol/L   Glucose, Bld 158 (H) 65 - 99 mg/dL   BUN 27 (H) 6 - 20 mg/dL   Creatinine, Ser 3.85 (H) 0.61 - 1.24 mg/dL    Comment: DELTA CHECK NOTED   Calcium 7.8 (L) 8.9 - 10.3 mg/dL   Total Protein 4.3 (L) 6.5 - 8.1 g/dL   Albumin 2.2 (L) 3.5 - 5.0 g/dL   AST 172 (H) 15 - 41 U/L   ALT 71 (H) 17 - 63 U/L   Alkaline Phosphatase 39 38 - 126 U/L   Total Bilirubin 1.0 0.3 - 1.2 mg/dL   GFR calc non Af Amer 15 (L) >60 mL/min   GFR calc Af Amer 18 (L) >60 mL/min    Comment: (NOTE) The eGFR has been calculated using the CKD EPI equation. This calculation has not been validated in all clinical situations. eGFR's persistently <60 mL/min signify possible Chronic Kidney Disease.    Anion gap 16 (H) 5 - 15  Magnesium     Status: Abnormal   Collection Time: 06/10/15  4:40 AM  Result Value Ref Range   Magnesium 1.3 (L) 1.7 - 2.4 mg/dL  Amylase     Status: Abnormal  Collection Time: 06/10/15  4:40 AM  Result Value Ref Range   Amylase 225 (H) 28 - 100 U/L  Protime-INR     Status: Abnormal   Collection Time: 06/10/15  4:40 AM  Result Value Ref Range   Prothrombin Time 17.2 (H) 11.6 - 15.2 seconds   INR 1.39 0.00 - 1.49  I-STAT 3, arterial blood gas (G3+)     Status: Abnormal   Collection Time: 06/10/15  5:02 AM  Result Value Ref Range   pH, Arterial 7.315 (L) 7.350 - 7.450   pCO2 arterial 30.2 (L) 35.0 - 45.0 mmHg   pO2, Arterial 67.0 (L) 80.0 - 100.0 mmHg   Bicarbonate 15.3 (L) 20.0 - 24.0 mEq/L   TCO2 16 0 - 100 mmol/L   O2 Saturation 91.0 %   Acid-base deficit 9.0 (H) 0.0 - 2.0 mmol/L   Patient temperature 37.7 C    Collection site ARTERIAL LINE    Drawn by Nurse     Sample type ARTERIAL    Dg Chest Port 1 View  06/10/2015  CLINICAL DATA:  64-year-old male post abdominal aortic aneurysm repair. Subsequent encounter. EXAM: PORTABLE CHEST 1 VIEW COMPARISON:  06/09/2015 FINDINGS: Endotracheal tube tip 3.3 cm above the carina. Swan-Ganz catheter tip pulmonary outflow tract/main pulmonary artery level. Nasogastric tube courses below the diaphragm. Tip is not included on the present exam. Heart size top-normal.  Slightly tortuous aorta. Central pulmonary vascular prominence without pulmonary edema. No gross pneumothorax or segmental consolidation. IMPRESSION: Endotracheal tube tip 3.3 cm above the carina. Swan-Ganz catheter tip pulmonary outflow tract/main pulmonary artery level. Heart size top-normal.  Slightly tortuous aorta. No pneumothorax, pulmonary edema or segmental consolidation. Electronically Signed   By: Steven  Olson M.D.   On: 06/10/2015 07:26   Dg Chest Port 1 View  06/09/2015  CLINICAL DATA:  Post AAA repair. EXAM: PORTABLE CHEST 1 VIEW COMPARISON:  05/05/2015 FINDINGS: Endotracheal tube is 4 cm above the carina. Swan-Ganz catheter tip is in the main pulmonary artery. NG tube enters the stomach. The side port is in the distal esophagus. Lungs are clear. Heart is borderline in size. No effusions or acute bony abnormality. IMPRESSION: No active disease. Electronically Signed   By: Kevin  Dover M.D.   On: 06/09/2015 16:12   Dg Abd Portable 1v  06/09/2015  CLINICAL DATA:  Status post abdominal aortic aneurysm repair. EXAM: PORTABLE ABDOMEN - 1 VIEW COMPARISON:  CT scan of May 26, 2015. FINDINGS: The bowel gas pattern is normal. Midline surgical staples are noted consistent with recent surgery. Rounded calcification is seen to the left of the upper lumbar spine consistent with aneurysm seen on prior CT scan. IMPRESSION: No evidence of bowel obstruction or ileus status post surgery. Electronically Signed   By: James  Green Jr, M.D.   On: 06/09/2015 16:05     ROS :unable to obtain   Blood pressure 103/68, pulse 122, temperature 99 F (37.2 C), temperature source Core (Comment), resp. rate 8, height 6' (1.829 m), weight 201 lb (91.173 kg), SpO2 94 %. Physical Exam  General: Lying in bed, intubated, intermittently opening eyes.  Eyes: Conjunctivae non-injected.  ENTM: ETT tube in place  Neck: Supple, left IJ catheter in place and c/d/i.   Cardiovascular: Irregularly irregular. No murmurs, rubs, or gallops noted. Trace pitting edema noted. Respiratory: No increased WOB. CTAB without wheezing, rhonchi, or crackles noted. Abdomen: +BS, soft, non-distended, tender to deep palpation, however no rebound or guarding. Midline dressing in place, clean, and dry.  Skin:   No rashes noted    Assessment/Plan 1. 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  Prior to 1/21, the patient's renal function was normal (Cr 1.21 on 04/09/15).  Cr continues to rise, currently 3.85. Potassium is elevated at 5.7, BUN 27, and a metabolic acidosis. Oligouric with only 327cc in UOP in the previous 24hrs however does not appear grossly volume overloaded. A left central venous dialysis catheter was placed on 2/7 in preparation for dialysis. No emergent cause of dialysis currently, however will require this in the near future most likely. Consider treatment of hyperkalemia with kayexalate. Intermittently hypotensive with metoprolol, consider decreasing as HR will allow.  ARF secondary to ATN 2. Anion gap metabolic acidosis: most likely secondary to ARF, appears to be improving. Consider the addition of IV sodium bicarb.   3. Hypomagnesemia: Repleting with magnesium sulfate 2mg IV.  Recheck in AM  4. Hyperkalemia: K5.7, likely from ARF. Consider addition of kayexalate. Continue to  monitor  5. Hypertension: BPs have been labile while here, I suspect partially secondary to acute blood loss. Currently 100s/60s, I suspect partially due to precedex.     Crystal Dorsey, MD [facility] Resident, PGY-2 06/10/2015, 11:14 AM I have seen and examined this patient and agree with plan Dr Dorsey.  64yo WM SP AAA repair and Lt renal bypass (total occlusion of Rt renal art) with post op hypotension and decrease in UO.  Scr 1.2 and now 3.85.  Has Lt IJ HD cath ready for HD when appropriate.  K sl high.  Will see if can stimulate UO with IV lasix and if cannot suspect will need HD in next 1-2 days.  Will DC lactated ringers and use isotonic bicarb.  Will recheck BMET and if K significantly higher then will do HD tonight. MATTINGLY,MICHAEL T,MD 06/10/2015 1:05 PM    

## 2015-06-10 NOTE — Progress Notes (Signed)
Afternoon Rounds  Comfortable. No acute Hemodynamic changes.  Passed SBT.    Exam BP 107/66 mmHg  Pulse 112  Temp(Src) 98.8 F (37.1 C) (Core (Comment))  Resp 25  Ht 6' (1.829 m)  Wt 201 lb (91.173 kg)  BMI 27.25 kg/m2  SpO2 91% PAP: (13-39)/(5-25) 29/17 mmHg CVP:  [0 mmHg-19 mmHg] 13 mmHg CO:  [3.4 L/min-5.4 L/min] 5.4 L/min CI:  [1.6 L/min/m2-2.5 L/min/m2] 2.5 L/min/m2   Intake/Output Summary (Last 24 hours) at 06/10/15 1346 Last data filed at 06/10/15 1300  Gross per 24 hour  Intake 3300.77 ml  Output   2027 ml  Net 1273.77 ml   Awake, no distress on PSV. HENT: Orally intubated. EOMi, no JVD. Pulm: CTA, no accessory muscle use. Card: RRR, no MRG. Abd: soft, not tender. Mid abd incision CD&I. + bowel sounds. Neuro: generalized weakness. Moves all ext.   Imp/plan  VDRF in postoperative setting  Hypotension AF Discussion Looks good; passed SBT Suspect that hypotension may be driven by precedex   Plan:  Extubate Dc precedex Bicarb gtt will help w/ acidosis Wean O2 PRN albuterol per home regimen  Erick Colace ACNP-BC [place] Pager # [phone] OR # [phone] if no answer

## 2015-06-10 NOTE — Clinical Documentation Improvement (Signed)
Cardiothoracic  Can the diagnosis of intra-operative hypotension be further specified? Please document response in next progress note NOT in BPA drop down box. Thank you.   Hypovolemic Shock  Intraoperative, intraprocedural hemorrhage  Other  Clinically Undetermined  Supporting Information:  "significant hypovolemia/hypotension intra-operatively and post-operatively with an estimated blood loss of 4L and subsequent transfusions" documented in Renal Consult on 06/10/15.   Please exercise your independent, professional judgment when responding. A specific answer is not anticipated or expected.  Thank You, Zoila Shutter RN, BSN, [place] [phone]

## 2015-06-10 NOTE — Consult Note (Signed)
PULMONARY / CRITICAL CARE MEDICINE   Name: [name] [name] MRN: [id number] DOB: 10-08-1950    ADMISSION DATE:  06/09/2015 CONSULTATION DATE:  06/09/2015  REFERRING MD:  Vascular Surgery, Dr. Oneida Alar  CHIEF COMPLAINT:  Post op VDRF  BRIEF :  65 year old male admitted on January 20 to [facility] with abdominal pain, found to have an abdominal aneurysm occluding the right renal artery.  He underwent an elective surgical repair including an aorto to left renal bypass procedure on 06/09/2015.  Pulmonary and critical care medicine was consulted for postoperative respiratory failure, renal failure, and anemia.   SUBJECTIVE:  No acute events overnight tachypneic on PSV this morning  VITAL SIGNS: BP 103/68 mmHg  Pulse 122  Temp(Src) 99 F (37.2 C) (Core (Comment))  Resp 8  Ht 6' (1.829 m)  Wt 201 lb (91.173 kg)  BMI 27.25 kg/m2  SpO2 94%  HEMODYNAMICS: PAP: (15-39)/(8-25) 15/8 mmHg CVP:  [2 mmHg-19 mmHg] 3 mmHg CO:  [3.4 L/min-5.4 L/min] 5.4 L/min CI:  [1.6 L/min/m2-2.5 L/min/m2] 2.5 L/min/m2  VENTILATOR SETTINGS: Vent Mode:  [-] PRVC FiO2 (%):  [50 %-100 %] 50 % Set Rate:  [16 bmp-18 bmp] 18 bmp Vt Set:  [650 mL] 650 mL PEEP:  [5 cmH20] 5 cmH20 Pressure Support:  [10 cmH20] 10 cmH20 Plateau Pressure:  [13 cmH20-18 cmH20] 17 cmH20  INTAKE / OUTPUT: I/O last 3 completed shifts: In: 8715.4 [I.V.:6025.4; Blood:1600; Other:10; NG/GT:30; IV Piggyback:1050] Out: [id number] [Urine:327; Emesis/NG output:1700; Blood:4700]  PHYSICAL EXAMINATION: General:  Awake on vent HEENT: NCAT OP clear ETT in place PULM: CTA B, normal effort CV: Irreg irreg, no mgr GI: BS infrequent, soft, midline scar MSK: Normal bulk and tone Neuro: awake, reaching for tube, calm, moves all four ext  LABS:  BMET  Recent Labs Lab 06/05/15 1600  06/09/15 1441 06/09/15 1655 06/10/15 0440  NA 140  < > 140 137 141  K 4.2  < > 5.1 5.3* 5.7*  CL 104  --   --  102 108  CO2 22  --   --  24 17*  BUN  12  --   --  18 27*  CREATININE 1.27*  --   --  2.34* 3.85*  GLUCOSE 81  --  163* 124* 158*  < > = values in this interval not displayed.  Electrolytes  Recent Labs Lab 06/05/15 1600 06/09/15 1655 06/10/15 0440  CALCIUM 9.9 8.4* 7.8*  MG  --  1.6* 1.3*    CBC  Recent Labs Lab 06/05/15 1600  06/09/15 1441 06/09/15 1655 06/10/15 0440  WBC 8.2  --   --  17.7* 19.4*  HGB 14.0  < > 11.2* 12.7* 12.8*  HCT 40.9  < > 33.0* 38.5* 39.5  PLT 292  --   --  99* 88*  < > = values in this interval not displayed.  Coag's  Recent Labs Lab 06/09/15 0604 06/09/15 1655 06/10/15 0440  APTT 33 36  --   INR 1.07 1.40 1.39    Sepsis Markers No results for input(s): LATICACIDVEN, PROCALCITON, O2SATVEN in the last 168 hours.  ABG  Recent Labs Lab 06/09/15 1435 06/09/15 1543 06/10/15 0502  PHART 7.324* 7.299* 7.315*  PCO2ART 43.6 54.1* 30.2*  PO2ART 100.0 311.0* 67.0*    Liver Enzymes  Recent Labs Lab 06/05/15 1600 06/09/15 1655 06/10/15 0440  AST 19 67* 172*  ALT 23 27 71*  ALKPHOS 85 42 39  BILITOT 0.3 1.4* 1.0  ALBUMIN 3.6 2.8* 2.2*  Cardiac Enzymes No results for input(s): TROPONINI, PROBNP in the last 168 hours.  Glucose  Recent Labs Lab 06/09/15 1656  GLUCAP 106*    Imaging  2/7/2017CXR > ETT in place, left IJ in place, no infiltrate  STUDIES:  CT abdomen 1/21 > Moderate right renal atrophy with hypoperfusion of the right kidney concerning for vascular compromise. Further evaluation of the right kidney as well as interrogation of the right renal artery with duplex ultrasound or CT angiography recommended. 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 Follow-up recommended. A 3.3 cm infrarenal abdominal aortic aneurysm. CT angio abdomen 1/23 > Enlarging saccular lesion along the left side of the infrarenal abdominal aorta. This has clearly enlarged since 2009  and it is concerning for an enlarging exophytic pseudoaneurysm. Critical stenosis or near occlusion of the right renal artery with an atrophic right kidney and delayed contrast enhancement in the right kidney. In addition, there appears to be significant stenosis involving the proximal left renal artery which may represent a combination of atherosclerotic disease and compression from the left aortic saccular lesion. Abdominal aortogram 1/25 > occluded R renal artery, possible source of pseudoaneurysm on L lateral side of aorta just below renal artery, moderate calcific stenosis and bilateral iliac arteries right greater than left, and diseased suprarenal aorta.  CULTURES:   ANTIBIOTICS: Cefuroxime periop  SIGNIFICANT EVENTS: 2/6 to OR, out on vent.   LINES/TUBES: RIJ PA cath 2/6 >>>  DISCUSSION: 65 year old male with known AAA recently admitted with abdominal pain. Found to have extension of aneurysm and occlusion of R renal artery. Presented 2/6 for surgical repair under Dr. Oneida Alar. Procedure complicated by 4L blood loss and subsequent transfusions. Post operatively he remained on ventilator in ICU> doing well this morning, would like to try to extubate after HD cath placed.  ASSESSMENT / PLAN:  PULMONARY A: VDRF in postoperative setting > as above, consider extubation after HD cath placement Some compensation for metabolic acidosis this morning causing tachypnea P:   Repeat PSV after HD cath Full vent support for now VAP prevention bundle PRN albuterol per home regimen  CARDIOVASCULAR A:  Abdominal aortic aneurysm s/p open repair 2/6 Paroxysmal atrial fibrillation (was on Xarelto, transitioned to warfarin prior to surgery, last dose 2/1) H/o CAD, HTN  P:  Telemetry IV metoprolol, restart now PRN hydralazine, labetalol, metoprolol Holding home amlodipine, clonidine, lisinopril  RENAL A:   Hyperkalemia AKI Metabolic acidosis due to AKI P:   Monitor BMET and  UOP Replace electrolytes as needed   GASTROINTESTINAL A:   GERD  P:   Consider starting Tube feedings if not extubated today Protonix for SUP  HEMATOLOGIC A:   Acute blood loss anemia in post operative setting Chronic anticoagulation in setting Afib  P:  Daily CBC Enoxaparin for VTE ppx Will defer full dose anticoagulation until OK with vascular surgery  INFECTIOUS A:   Surgical prophylaxis  P:   Cefuroxime Trend WBC and fever curve  ENDOCRINE A:   No acute issues  P:   Follow glucose on BMP  NEUROLOGIC A:   Acute encephalopathy in post-operative setting  P:   RASS goal: -1 Precedex infusion for medical sedation PRN morphine, dilaudid for analgesia   FAMILY  - Updates: no family at bedside  - Inter-disciplinary family meet or Palliative Care meeting due by:  2/12  My cc time 35 minutes  Roselie Awkward, MD [place] [facility] Pager: [phone] Cell: (  [phone] After 3pm or if no response, call [phone]

## 2015-06-10 NOTE — Progress Notes (Signed)
UR Completed. Kenzi Bardwell, RN, BSN.  [phone] 

## 2015-06-10 NOTE — Progress Notes (Signed)
PT Cancellation Note  Patient Details Name: [name] [name] MRN: [id number] DOB: 09-28-1950   Cancelled Treatment:    Reason Eval/Treat Not Completed: Medical issues which prohibited therapy.  Intubated and sedated.  Will see 2/7 as able. 06/10/2015  Donnella Sham, [place] [phone]  (pager)   Alain Deschene, Tessie Fass 06/10/2015, 10:00 AM

## 2015-06-10 NOTE — Procedures (Signed)
Extubation Procedure Note  Patient Details:   Name: [name] [name] DOB: 11-25-1950 MRN: [id number]   Airway Documentation:     Evaluation  O2 sats: stable throughout Complications: No apparent complications Patient did tolerate procedure well. Bilateral Breath Sounds: Clear   Yes   Pt. Was extubated to a 4L [place] without any complications, dyspnea or stridor noted. Pt. Was instructed on IS x 5, highest goal achieved was 630mL.  Ramirez Fullbright, Eddie North 06/10/2015, 1:51, PM

## 2015-06-10 NOTE — Procedures (Signed)
Central Venous dialysis Catheter Insertion Procedure Note RILLEY WAITE [id number] 14-Jan-1951  Procedure: Insertion of Central Venous Catheter Indications: dialysis  Procedure Details Consent: Risks of procedure as well as the alternatives and risks of each were explained to the (patient/caregiver).  Consent for procedure obtained. Time Out: Verified patient identification, verified procedure, site/side was marked, verified correct patient position, special equipment/implants available, medications/allergies/relevent history reviewed, required imaging and test results available.  Performed Real time [place] was used to ID and cannulate  Maximum sterile technique was used including antiseptics, cap, gloves, gown, hand hygiene, mask and sheet. Skin prep: Chlorhexidine; local anesthetic administered A antimicrobial bonded/coated triple lumen catheter was placed in the left internal jugular vein using the Seldinger technique.  Evaluation Blood flow good Complications: No apparent complications Patient did tolerate procedure well. Chest X-ray ordered to verify placement.  CXR: pending.  Clementeen Graham 06/10/2015, 11:16 AM

## 2015-06-10 NOTE — Progress Notes (Signed)
OT Cancellation Note  Patient Details Name: [name] [name] MRN: [id number] DOB: 30-Mar-1951   Cancelled Treatment:    Reason Eval/Treat Not Completed: Patient not medically ready (Pt intubated and sedated. Will follow.)  [name] 06/10/2015, 11:49 AM

## 2015-06-10 NOTE — Progress Notes (Signed)
RRT came at 02:20 to address the vent alarms and also helped get the a-line to have a good waveform again.   Pt breathing over the vent regardless of max dose sedation (Precedex at 1.2) and PRN pain medications. Will continue to give PRN meds as appropriate, and will continue to monitor pt's respiratory status.   Bladder scanned pt at 02:40 to r/o foley malposition as reason for the anuria. Bladder scan showed 0 cc. MD aware from day shift, gave no orders r/t continued anuria.   No immediate concerns. Will continue to monitor.  Henreitta Leber, RN 2:48 AM 06/10/2015

## 2015-06-10 NOTE — Progress Notes (Addendum)
Initial Nutrition Assessment  DOCUMENTATION CODES:   Not applicable  INTERVENTION:    If TF started, recommend Vital AF 1.2 formula -- initiate at 20 ml/hr and increase by 10 ml every 4 hours to goal rate of 80 ml/hr  TF regimen to provide 2304 kcals, 144 gm protein, 1557 ml of water  NUTRITION DIAGNOSIS:   Inadequate oral intake related to inability to eat as evidenced by NPO status  GOAL:   Patient will meet greater than or equal to 90% of their needs  MONITOR:   Vent status, Labs, Weight trends, I & O's  REASON FOR ASSESSMENT:   Ventilator  ASSESSMENT:   65 yo Male with PMH as below, which is significant HTN, Atrial Fib on warfarin, CVA, and known AAA. He was recently admitted to [facility] ED 1/20 with complaints of abdominal pain. CT abdomen at that time showed evidence of hypoperfusion to the R kidney. CT angio showed some enlargement of the seat of an aneurysm. 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 It was on that day that he presented, and underwent the procedures. Estimated 4L blood loss during case. Post operatively he remains on vent in surgical ICU.    Patient s/p procedure 2/6: REPAIR OF JUXTARENAL ABDOMINAL AORTIC ANEURYSM, LEFT RENAL BYPASS  Patient is currently intubated on ventilator support -- NGT to LIS MV: 16.9 L/min Temp (24hrs), Avg:99.1 F (37.3 C), Min:97.9 F (36.6 C), Max:100 F (37.8 C)   Vascular Surgery note reviewed.  Nephrology consulted.  Most likely will need HD.  Nutrition focused physical exam completed.  No  muscle or subcutaneous fat depletion noticed.  Diet Order:  Diet NPO time specified  Skin:  Reviewed, no issues  Last BM:  2/6  Height:   Ht Readings from Last 1 Encounters:  06/09/15 6' (1.829 m)    Weight:   Wt Readings from Last 1 Encounters:  06/09/15 201 lb (91.173 kg)    Ideal Body Weight:  81 kg  BMI:  Body mass index is 27.25 kg/(m^2).  Estimated Nutritional Needs:   Kcal:  2307  Protein:  135-145 gm  Fluid:  per MD  EDUCATION NEEDS:   No education needs identified at this time  Arthur Holms, RD, LDN Pager #: [phone] After-Hours Pager #: [phone]

## 2015-06-10 NOTE — Progress Notes (Signed)
[facility]  Subjective  - fairly stable overnight required one additional fluid bolus, no pressors, anuric   Objective 98/50 99 100 F (37.8 C) (Core (Comment)) 29 93%  Intake/Output Summary (Last 24 hours) at 06/10/15 0737 Last data filed at 06/10/15 0500  Gross per 24 hour  Intake 8510.57 ml  Output   6627 ml  Net 1883.57 ml   CVP 5 PAD 12  Abdomen soft incision clean Feet pink warm  Assessment/Planning: Overall fairly stable, may be still a little hypovolemic with soft BP will bolus again this am Have consulted nephrology to assist with volume status and most likely will need HD today for acidosis and hyperkalemia Dr [place] will place catheter this am Will leave at discretion of CCM whether or not to extubate Neuro follows commands  Ruta Hinds 06/10/2015 7:37 AM --  Laboratory Lab Results:  Recent Labs  06/09/15 1655 06/10/15 0440  WBC 17.7* 19.4*  HGB 12.7* 12.8*  HCT 38.5* 39.5  PLT 99* 88*   BMET  Recent Labs  06/09/15 1655 06/10/15 0440  NA 137 141  K 5.3* 5.7*  CL 102 108  CO2 24 17*  GLUCOSE 124* 158*  BUN 18 27*  CREATININE 2.34* 3.85*  CALCIUM 8.4* 7.8*    COAG Lab Results  Component Value Date   INR 1.39 06/10/2015   INR 1.40 06/09/2015   INR 1.07 06/09/2015   No results found for: PTT

## 2015-06-10 NOTE — Progress Notes (Signed)
Called RRT regarding very frequent vent alarms (RR >30), spoke with Suezanne Jacquet who said he'd be right over to check on it.   Will continue to monitor.  Henreitta Leber, RN 2:18 AM 06/10/2015

## 2015-06-11 ENCOUNTER — Inpatient Hospital Stay (HOSPITAL_COMMUNITY): Payer: Medicare Other

## 2015-06-11 LAB — CBC
HCT: 32.6 % — ABNORMAL LOW (ref 39.0–52.0)
Hemoglobin: 10.4 g/dL — ABNORMAL LOW (ref 13.0–17.0)
MCH: 27.7 pg (ref 26.0–34.0)
MCHC: 31.9 g/dL (ref 30.0–36.0)
MCV: 86.9 fL (ref 78.0–100.0)
Platelets: 47 10*3/uL — ABNORMAL LOW (ref 150–400)
RBC: 3.75 MIL/uL — ABNORMAL LOW (ref 4.22–5.81)
RDW: 16.7 % — ABNORMAL HIGH (ref 11.5–15.5)
WBC: 20.7 10*3/uL — ABNORMAL HIGH (ref 4.0–10.5)

## 2015-06-11 LAB — RENAL FUNCTION PANEL
Albumin: 1.9 g/dL — ABNORMAL LOW (ref 3.5–5.0)
Anion gap: 16 — ABNORMAL HIGH (ref 5–15)
BUN: 48 mg/dL — ABNORMAL HIGH (ref 6–20)
CO2: 22 mmol/L (ref 22–32)
Calcium: 7.7 mg/dL — ABNORMAL LOW (ref 8.9–10.3)
Chloride: 105 mmol/L (ref 101–111)
Creatinine, Ser: 5.98 mg/dL — ABNORMAL HIGH (ref 0.61–1.24)
GFR calc Af Amer: 10 mL/min — ABNORMAL LOW (ref >60)
GFR calc non Af Amer: 9 mL/min — ABNORMAL LOW (ref >60)
Glucose, Bld: 134 mg/dL — ABNORMAL HIGH (ref 65–99)
Phosphorus: 6.7 mg/dL — ABNORMAL HIGH (ref 2.5–4.6)
Potassium: 5.1 mmol/L (ref 3.5–5.1)
Sodium: 143 mmol/L (ref 135–145)

## 2015-06-11 LAB — MAGNESIUM: Magnesium: 1.6 mg/dL — ABNORMAL LOW (ref 1.7–2.4)

## 2015-06-11 MED ORDER — MAGNESIUM SULFATE 50 % IJ SOLN: 2.0000 g | Freq: Once | INTRAMUSCULAR | Status: DC

## 2015-06-11 MED ORDER — AMIODARONE LOAD VIA INFUSION
150.0000 mg | Freq: Once | INTRAVENOUS | Status: AC
  Administered 2015-06-11: 150 mg via INTRAVENOUS
  Filled 2015-06-11: qty 83.34

## 2015-06-11 MED ORDER — AMIODARONE HCL IN DEXTROSE 360-4.14 MG/200ML-% IV SOLN
60.0000 mg/h | INTRAVENOUS | Status: AC
  Administered 2015-06-11 (×2): 60 mg/h via INTRAVENOUS
  Filled 2015-06-11 (×2): qty 200

## 2015-06-11 MED ORDER — MAGNESIUM SULFATE 2 GM/50ML IV SOLN
2.0000 g | Freq: Once | INTRAVENOUS | Status: AC
  Administered 2015-06-11: 2 g via INTRAVENOUS
  Filled 2015-06-11: qty 50

## 2015-06-11 MED ORDER — AMIODARONE HCL IN DEXTROSE 360-4.14 MG/200ML-% IV SOLN
30.0000 mg/h | INTRAVENOUS | Status: DC
Start: 2015-06-11 — End: 2015-06-20
  Administered 2015-06-11 – 2015-06-20 (×18): 30 mg/h via INTRAVENOUS
  Filled 2015-06-11 (×18): qty 200

## 2015-06-11 MED ORDER — CETYLPYRIDINIUM CHLORIDE 0.05 % MT LIQD
7.0000 mL | Freq: Two times a day (BID) | OROMUCOSAL | Status: DC
  Administered 2015-06-11 – 2015-06-12 (×3): 7 mL via OROMUCOSAL

## 2015-06-11 MED ORDER — METOPROLOL TARTRATE 1 MG/ML IV SOLN
2.5000 mg | INTRAVENOUS | Status: DC | PRN
  Administered 2015-06-11: 2.5 mg via INTRAVENOUS

## 2015-06-11 MED ORDER — LEVALBUTEROL HCL 0.63 MG/3ML IN NEBU
0.6300 mg | INHALATION_SOLUTION | RESPIRATORY_TRACT | Status: DC
Start: 2015-06-11 — End: 2015-06-12
  Administered 2015-06-11 – 2015-06-12 (×5): 0.63 mg via RESPIRATORY_TRACT
  Filled 2015-06-11 (×5): qty 3

## 2015-06-11 MED ORDER — CHLORHEXIDINE GLUCONATE 0.12 % MT SOLN
15.0000 mL | Freq: Two times a day (BID) | OROMUCOSAL | Status: DC
  Administered 2015-06-11 – 2015-06-13 (×4): 15 mL via OROMUCOSAL
  Filled 2015-06-11 (×2): qty 15

## 2015-06-11 MED FILL — Heparin Sodium (Porcine) Inj 1000 Unit/ML: INTRAMUSCULAR | Qty: 30 | Status: AC

## 2015-06-11 MED FILL — Sodium Chloride IV Soln 0.9%: INTRAVENOUS | Qty: 4000 | Status: AC

## 2015-06-11 NOTE — Progress Notes (Signed)
eLink Physician-Brief Progress Note Patient Name: [name] [name] DOB: March 12, 1951 MRN: [id number]   Date of Service  06/11/2015  HPI/Events of Note  RN notified of HR in 110-120s after Albuterol nebs. Feels nebs are helping his breathing however.  eICU Interventions  Switch to Xopenex neb q4hr.     Intervention Category Intermediate Interventions: Other:  Tera Partridge 06/11/2015, 3:54 PM

## 2015-06-11 NOTE — Progress Notes (Signed)
[facility] Progress Note    Assessment/ Plan:   1. 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  Prior to 1/21, the patient's renal function was normal (Cr 1.21 on 04/09/15). Cr continues to rise from 1.27 prior to the procedure to 3.85>4.5>5.98 with a BUN 48.  Anuric. A left central venous dialysis catheter was placed on 2/7 in preparation for dialysis.  Intermittently hypotensive with metoprolol. Looking at the A-line, the patient's BPs ar running anywhere from 150s-60s/60-50s, could attempt a short run of intermittent HD to see if the patient's blood pressure tolerates it.   2. Anion gap metabolic acidosis: most likely secondary to ARF, appears to be improving with IV sodium bicarb.   3. Hypomagnesemia: Repleted with magnesium sulfate 2mg  IV, 1>3 >1.6. Being repleted again this AM with magnesium sulfate 2gm  4. Hyperkalemia: K5.7 >5.2 >5.1, likely from ARF. Will be corrected with HD.   5. Hypertension: BPs have been labile while here, I suspect partially secondary to initially RAS and then  acute blood loss. Currently 110s/50s.  5. Atrial fibrillation: patient in Afib with RVR overnight, started on an amio gtt.   Subjective:   Patient with increased O2 requirement since last night (extubated yesterday). Notes significant congestion. Pain at surgical site.   Objective:   BP 98/62 mmHg  Pulse 116  Temp(Src) 97.2 F (36.2 C) (Core (Comment))  Resp 19  Ht 6' (1.829 m)  Wt 201 lb (91.173 kg)  BMI 27.25 kg/m2  SpO2 94%  Intake/Output Summary (Last 24 hours) at 06/11/15 0656 Last data filed at 06/11/15 0600  Gross per 24 hour  Intake 2656.01 ml  Output    505 ml  Net 2151.01 ml   Weight change:   Physical Exam: General: Lying in bed on NRB  left  IJ catheter in place and c/d/i.  Cardiovascular: RRR. No murmurs, rubs, or gallops noted.  1+ pitting edema noted. Respiratory: No increased WOB. On NRB.Transmitted upper airway sounds. Abdomen: No bowel sounds, soft, some tenderness near incision site, clean, dry, closed with staples.      Imaging: Dg Chest Port 1 View  06/10/2015  CLINICAL DATA:  Encounter for central line placement. EXAM: PORTABLE CHEST 1 VIEW COMPARISON:  06/10/2015 at 5:39 a.m. FINDINGS: New dual-lumen left internal jugular central venous line has been placed. The tip projects in the mid to lower superior vena cava. There is no pneumothorax. Right internal jugular Swan-Ganz catheter, oral/nasogastric tube and endotracheal tube are stable in well positioned. Prominent interstitial markings bilaterally are stable. IMPRESSION: 1. New left internal jugular dual-lumen central venous catheter with its tip in the mid to lower superior vena cava. No pneumothorax. No other change. Electronically Signed   By: Lajean Manes M.D.   On: 06/10/2015 12:37   Dg Chest Port 1 View  06/10/2015  CLINICAL DATA:  65 year old male post abdominal aortic aneurysm repair. Subsequent encounter. EXAM: PORTABLE CHEST 1 VIEW COMPARISON:  06/09/2015 FINDINGS: Endotracheal tube tip 3.3 cm above the carina. Swan-Ganz catheter tip pulmonary outflow tract/main pulmonary artery level. Nasogastric tube courses below the diaphragm. Tip is not included on the present exam. Heart size top-normal.  Slightly tortuous aorta. Central pulmonary vascular prominence without pulmonary edema. No gross pneumothorax or segmental consolidation. IMPRESSION: Endotracheal tube tip 3.3 cm above the carina. Swan-Ganz catheter  tip pulmonary outflow tract/main pulmonary artery level. Heart size top-normal.  Slightly tortuous aorta. No pneumothorax, pulmonary edema or segmental consolidation. Electronically Signed   By: Genia Del M.D.   On: 06/10/2015 07:26   Dg Chest Port 1  View  06/09/2015  CLINICAL DATA:  Post AAA repair. EXAM: PORTABLE CHEST 1 VIEW COMPARISON:  05/05/2015 FINDINGS: Endotracheal tube is 4 cm above the carina. Swan-Ganz catheter tip is in the main pulmonary artery. NG tube enters the stomach. The side port is in the distal esophagus. Lungs are clear. Heart is borderline in size. No effusions or acute bony abnormality. IMPRESSION: No active disease. Electronically Signed   By: Rolm Baptise M.D.   On: 06/09/2015 16:12   Dg Abd Portable 1v  06/09/2015  CLINICAL DATA:  Status post abdominal aortic aneurysm repair. EXAM: PORTABLE ABDOMEN - 1 VIEW COMPARISON:  CT scan of May 26, 2015. FINDINGS: The bowel gas pattern is normal. Midline surgical staples are noted consistent with recent surgery. Rounded calcification is seen to the left of the upper lumbar spine consistent with aneurysm seen on prior CT scan. IMPRESSION: No evidence of bowel obstruction or ileus status post surgery. Electronically Signed   By: Marijo Conception, M.D.   On: 06/09/2015 16:05    Labs: BMET  Recent Labs Lab 06/05/15 1600  06/09/15 1400 06/09/15 1435 06/09/15 1441 06/09/15 1655 06/10/15 0440 06/10/15 1323 06/11/15 0305  NA 140  < > 138 141 140 137 141 141 143  K 4.2  < > 5.9* 5.1 5.1 5.3* 5.7* 5.2* 5.1  CL 104  --   --   --   --  102 108 107 105  CO2 22  --   --   --   --  24 17*  --  22  GLUCOSE 81  --   --   --  163* 124* 158* 152* 134*  BUN 12  --   --   --   --  18 27* 39* 48*  CREATININE 1.27*  --   --   --   --  2.34* 3.85* 4.50* 5.98*  CALCIUM 9.9  --   --   --   --  8.4* 7.8*  --  7.7*  PHOS  --   --   --   --   --   --   --   --  6.7*  < > = values in this interval not displayed. CBC  Recent Labs Lab 06/05/15 1600  06/09/15 1441 06/09/15 1655 06/10/15 0440 06/10/15 1323  WBC 8.2  --   --  17.7* 19.4*  --   HGB 14.0  < > 11.2* 12.7* 12.8* 12.2*  HCT 40.9  < > 33.0* 38.5* 39.5 36.0*  MCV 85.2  --   --  85.4 86.4  --   PLT 292  --   --  99* 88*  --    < > = values in this interval not displayed.  Medications:    . albuterol  2.5 mg Nebulization Q4H  . antiseptic oral rinse  7 mL Mouth Rinse q12n4p  . chlorhexidine  15 mL Mouth Rinse BID  . docusate sodium  100 mg Oral Daily  . enoxaparin (LOVENOX) injection  30 mg Subcutaneous Q24H  . furosemide  160 mg Intravenous Q6H  . metoprolol  5 mg Intravenous 4 times per day  . pantoprazole (PROTONIX) IV  40 mg Intravenous QHS      Kathrine Cords, MD [facility]  [facility] Resident, PGY-2 06/11/2015, 6:56 AM  I have seen and examined this patient and agree with plan per Dr Lorenso Courier.  Still no UO and Scr higher.  K sl better and bicarb better.  Will plan HD today and DC bicarb gtt. Kealie Barrie T,MD 06/11/2015 10:02 AM

## 2015-06-11 NOTE — Progress Notes (Signed)
PT Cancellation Note  Patient Details Name: DALMER DOLINSKI MRN: [id number] DOB: Nov 02, 1950   Cancelled Treatment:    Reason Eval/Treat Not Completed: Medical issues which prohibited therapy.  Hold again today.  On 100% NRB, getting ready to start dialysis. 06/11/2015  Donnella Sham, [place] [phone]  (pager)   Dravin Lance, Tessie Fass 06/11/2015, 9:51 AM

## 2015-06-11 NOTE — Progress Notes (Signed)
[facility]  Subjective  - some shortness of breath and congestion   Objective 105/77 116 97.3 F (36.3 C) (Core (Comment)) 33 93%  Intake/Output Summary (Last 24 hours) at 06/11/15 0730 Last data filed at 06/11/15 0700  Gross per 24 hour  Intake 2671.91 ml  Output    705 ml  Net 1966.91 ml   Remains anuric Abdomen soft incision clean, some mucous stool, NG still bilious/blood tinged Extremities feet warm trace edema  Chest xray some volume overload   Assessment/Planning: ATN creatinine continues to rise anuric, although potassium and acidosis are fairly well controlled I do not believe we will be able to avoid dialysis as he is going to begin bringing third space volume from the last 2 days intravascularly. Nephrology to see later today, dialysis catheter in place.  Afib rapid rate on amio per CCM  Ileus keep NG for now, Kaexylate not going to be very helpful as long as gut is not working  CBC pending with mucus stool need to keep ischemic colitis in differential   OOB today, keep swan for now to help with measuring volume status  Neuro awake alert following commands   Ruta Hinds 06/11/2015 7:30 AM --  Laboratory Lab Results:  Recent Labs  06/09/15 1655 06/10/15 0440 06/10/15 1323  WBC 17.7* 19.4*  --   HGB 12.7* 12.8* 12.2*  HCT 38.5* 39.5 36.0*  PLT 99* 88*  --    BMET  Recent Labs  06/10/15 0440 06/10/15 1323 06/11/15 0305  NA 141 141 143  K 5.7* 5.2* 5.1  CL 108 107 105  CO2 17*  --  22  GLUCOSE 158* 152* 134*  BUN 27* 39* 48*  CREATININE 3.85* 4.50* 5.98*  CALCIUM 7.8*  --  7.7*    COAG Lab Results  Component Value Date   INR 1.39 06/10/2015   INR 1.40 06/09/2015   INR 1.07 06/09/2015   No results found for: PTT

## 2015-06-11 NOTE — Progress Notes (Signed)
Arrived to patient room 2S-16.  Reviewed treatment plan and this RN agrees with plan.  Report received from bedside RN, Crystal.  Consent verified.  Patient A & o X 4.   Lung sounds rhonchi to ausculation in all fields. BLE 1+ pitting edema. Cardiac:  Afib, RVR.  Removed caps and cleansed LIJ catheter with chlorhedxidine.  Aspirated ports of heparin and flushed them with saline per protocol.  Connected and secured lines, initiated treatment at 1615.  UF Goal of 2.5L and net fluid removal 2L.  Will continue to monitor.

## 2015-06-11 NOTE — Care Management Note (Addendum)
Case Management Note  Patient Details  Name: CHARLEY LAFRANCE MRN: [id number] Date of Birth: 03-26-1951  Subjective/Objective:    He underwent an elective surgical repair including an aorto to left renal bypass procedure on 06/09/2015 and developed VDRF -  Transferred to ICU                Action/Plan:   Pt is from home with wife and daughter.  CM will continue to monitor for disposition needs   Expected Discharge Date:                  Expected Discharge Plan:  [place]  In-House Referral:     Discharge planning Services  CM Consult  Post Acute Care Choice:    Choice offered to:     DME Arranged:    DME Agency:     HH Arranged:    HH Agency:     Status of Service:  In process, will continue to follow  Medicare Important Message Given:    Date Medicare IM Given:    Medicare IM give by:    Date Additional Medicare IM Given:    Additional Medicare Important Message give by:     If discussed at [place] of Stay Meetings, dates discussed:    Additional Comments:  Maryclare Labrador, RN 06/11/2015, 11:25 AM

## 2015-06-11 NOTE — Progress Notes (Signed)
Dialysis treatment completed.  1500 mL ultrafiltrated.  1000 mL net fluid removal.  Patient status unchanged. Lung sounds course, rhonchi to ausculation in all fields. BLE edema. Cardiac: Afib, RVR.  Cleansed LIJ catheter with chlorhexidine.  Disconnected lines and flushed ports with saline per protocol.  Ports locked with heparin and capped per protocol.    Report given to bedside, RN Crystal.

## 2015-06-11 NOTE — Progress Notes (Signed)
Pt afib RVR in 130-140s; pt has no remaining doses of PRN IV lopressor; only on scheduled q6 lopressor. Paged MD, orders received for amiodarone bolus and infusion and to send AM labs now. Will adminsiter and continue to monitor.

## 2015-06-11 NOTE — Progress Notes (Signed)
Nephrologist notified of HR sustaining in 130's, reaching 150's at times.  Verbal order received to d/c treatment after two hours if BP falls out of parameters.  Will continue to monitor.

## 2015-06-11 NOTE — Progress Notes (Signed)
OT Cancellation Note  Patient Details Name: [name] [name] MRN: [id number] DOB: 1951-02-18   Cancelled Treatment:    Reason Eval/Treat Not Completed: Patient not medically ready. Pt on 100% rebreather. To start dialysis. Hold today. Will continue to follow.  [name] 06/11/2015, 11:00 AM  [phone]

## 2015-06-11 NOTE — Progress Notes (Signed)
Called Elink, Dr Milinda Hirschfeld about patient heart rate 130's-150's during dialysis. Dr Milinda Hirschfeld stated to continue to watch patient during treatment and make him aware if heart rate continues at that rate after dialysis treatment.  [name] [name]

## 2015-06-11 NOTE — Progress Notes (Signed)
eLink Physician-Brief Progress Note Patient Name: [name] [name] DOB: 04-Dec-1950 MRN: [id number]   Date of Service  06/11/2015  HPI/Events of Note  AFIB with RVR - ventricular rate is in the 130's to 140's. AFIB is chronic by Hx. Patient is on Metoprolol IV already.   eICU Interventions  Will order: 1. Send AM labs now.  2. Amiodarone IV bolus and infusion.      Intervention Category Major Interventions: Arrhythmia - evaluation and management  Sommer,Steven Eugene 06/11/2015, 3:00 AM

## 2015-06-11 NOTE — Progress Notes (Signed)
PULMONARY / CRITICAL CARE MEDICINE   Name: [name] [name] MRN: [id number] DOB: 12/22/1950    ADMISSION DATE:  06/09/2015 CONSULTATION DATE:  06/09/2015  REFERRING MD:  Vascular Surgery, Dr. Oneida Alar  CHIEF COMPLAINT:  Post op VDRF  BRIEF :  65 year old male admitted on January 20 to [facility] with abdominal pain, found to have an abdominal aneurysm occluding the right renal artery.  He underwent an elective surgical repair including an aorto to left renal bypass procedure on 06/09/2015.  Pulmonary and critical care medicine was consulted for postoperative respiratory failure, renal failure, and anemia.   SUBJECTIVE:  HD cath placed yesterday Extubated yesterday On NRB mask to maintain O2 > 90%  VITAL SIGNS: BP 96/63 mmHg  Pulse 120  Temp(Src) 97 F (36.1 C) (Core (Comment))  Resp 18  Ht 6' (1.829 m)  Wt 201 lb (91.173 kg)  BMI 27.25 kg/m2  SpO2 91%  HEMODYNAMICS: PAP: (23-57)/(9-28) 32/21 mmHg CVP:  [6 mmHg-23 mmHg] 10 mmHg CO:  [5.4 L/min-6.8 L/min] 6.1 L/min CI:  [2.5 L/min/m2-3.2 L/min/m2] 2.9 L/min/m2  VENTILATOR SETTINGS: Vent Mode:  [-] PSV;CPAP FiO2 (%):  [50 %-100 %] 100 % Set Rate:  [18 bmp] 18 bmp Vt Set:  [650 mL] 650 mL PEEP:  [5 cmH20] 5 cmH20 Pressure Support:  [5 cmH20] 5 cmH20  INTAKE / OUTPUT: I/O last 3 completed shifts: In: 4490.7 [I.V.:4038.7; Other:10; IV Piggyback:442] Out: [id number] [Urine:22; Emesis/NG output:1550]  PHYSICAL EXAMINATION: General:  Awake, lying in bed HEENT: NCAT OP clear NRB mask PULM: CTA B, normal effort CV: Irreg irreg, no mgr GI: BS infrequent, soft, midline scar MSK: Normal bulk and tone Neuro: awake, conversant, maew  LABS:  BMET  Recent Labs Lab 06/09/15 1655 06/10/15 0440 06/10/15 1323 06/11/15 0305  NA 137 141 141 143  K 5.3* 5.7* 5.2* 5.1  CL 102 108 107 105  CO2 24 17*  --  22  BUN 18 27* 39* 48*  CREATININE 2.34* 3.85* 4.50* 5.98*  GLUCOSE 124* 158* 152* 134*    Electrolytes  Recent  Labs Lab 06/09/15 1655 06/10/15 0440 06/11/15 0305  CALCIUM 8.4* 7.8* 7.7*  MG 1.6* 1.3* 1.6*  PHOS  --   --  6.7*    CBC  Recent Labs Lab 06/09/15 1655 06/10/15 0440 06/10/15 1323 06/11/15 0700  WBC 17.7* 19.4*  --  20.7*  HGB 12.7* 12.8* 12.2* 10.4*  HCT 38.5* 39.5 36.0* 32.6*  PLT 99* 88*  --  47*    Coag's  Recent Labs Lab 06/09/15 0604 06/09/15 1655 06/10/15 0440  APTT 33 36  --   INR 1.07 1.40 1.39    Sepsis Markers No results for input(s): LATICACIDVEN, PROCALCITON, O2SATVEN in the last 168 hours.  ABG  Recent Labs Lab 06/09/15 1435 06/09/15 1543 06/10/15 0502  PHART 7.324* 7.299* 7.315*  PCO2ART 43.6 54.1* 30.2*  PO2ART 100.0 311.0* 67.0*    Liver Enzymes  Recent Labs Lab 06/05/15 1600 06/09/15 1655 06/10/15 0440 06/11/15 0305  AST 19 67* 172*  --   ALT 23 27 71*  --   ALKPHOS 85 42 39  --   BILITOT 0.3 1.4* 1.0  --   ALBUMIN 3.6 2.8* 2.2* 1.9*    Cardiac Enzymes No results for input(s): TROPONINI, PROBNP in the last 168 hours.  Glucose  Recent Labs Lab 06/09/15 1656  GLUCAP 106*    Imaging  2/7/2017CXR > ETT in place, left IJ in place, no infiltrate  STUDIES:  CT abdomen 1/21 >  Moderate right renal atrophy with hypoperfusion of the right kidney concerning for vascular compromise. Further evaluation of the right kidney as well as interrogation of the right renal artery with duplex ultrasound or CT angiography recommended. 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 Follow-up recommended. A 3.3 cm infrarenal abdominal aortic aneurysm. CT angio abdomen 1/23 > Enlarging saccular lesion along the left side of the infrarenal abdominal aorta. 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 Critical stenosis or near occlusion of the right renal artery with an atrophic right kidney and  delayed contrast enhancement in the right kidney. In addition, there appears to be significant stenosis involving the proximal left renal artery which may represent a combination of atherosclerotic disease and compression from the left aortic saccular lesion. Abdominal aortogram 1/25 > occluded R renal artery, possible source of pseudoaneurysm on L lateral side of aorta just below renal artery, moderate calcific stenosis and bilateral iliac arteries right greater than left, and diseased suprarenal aorta.  CULTURES:   ANTIBIOTICS: Cefuroxime periop  SIGNIFICANT EVENTS: 2/6 to OR, out on vent.   LINES/TUBES: RIJ PA cath 2/6 >>> 2/8 L IJ HC cath 2/7 >>>   DISCUSSION: 65 year old male with known AAA recently admitted with abdominal pain. Found to have extension of aneurysm and occlusion of R renal artery. Presented 2/6 for surgical repair under Dr. Oneida Alar. Procedure complicated by 4L blood loss and subsequent transfusions. Post operatively he remained on ventilator in ICU but was extubated easily on 2/7.  Now with some upper airway secretions.  Poor positioning and splinting due to pain.  Doesn't need intubation but needs to get out of bed, use flutter, etc.   ASSESSMENT / PLAN:  PULMONARY A: Acute respiratory failure with hypoxemia, mostly due to upper airway secretions, weak cough and splinting  P:   Out of bed now Incentive spirometry Educated on splinting for cough Flutter valve Monitor O2 saturation and respiratory status closely in ICU setting  CARDIOVASCULAR A:  Abdominal aortic aneurysm s/p open repair 2/6 Paroxysmal atrial fibrillation (was on Xarelto, transitioned to warfarin prior to surgery, last dose 2/1) H/o CAD, HTN Anasarca P:  Telemetry Amiodarone started overnight, continue  IV metoprolol   PRN hydralazine, labetalol, metoprolol Holding home amlodipine, clonidine, lisinopril Remove swan  RENAL A:   AKI > oliguric Anasarca, 4 L positive P:   Monitor  BMET and UOP Replace electrolytes as needed HD/ultrafiltration today  GASTROINTESTINAL A:   GERD  P:   Maintain NPO until oxygenation improved Protonix for SUP  HEMATOLOGIC A:   Acute blood loss anemia in post operative setting Chronic anticoagulation in setting Afib Thrombocytopenia > due to blood loss? Too early for HITT P:  Daily CBC Enoxaparin for VTE ppx Check HITT Will defer full dose anticoagulation until OK with vascular surgery  INFECTIOUS A:   Surgical prophylaxis  P:   Cefuroxime Trend WBC and fever curve  ENDOCRINE A:   No acute issues  P:   Follow glucose on BMP  NEUROLOGIC A:   Acute encephalopathy in post-operative setting > resolved  P:   PRN morphine, dilaudid for analgesia > use with caution   FAMILY  - Updates: no family at bedside  - Inter-disciplinary family meet or Palliative Care meeting due by:  2/12  My cc time 38 minutes  Roselie Awkward, MD [place] [facility] Pager: [phone] Cell: [phone] After 3pm or  if no response, call [phone]

## 2015-06-12 ENCOUNTER — Inpatient Hospital Stay (HOSPITAL_COMMUNITY): Payer: Medicare Other

## 2015-06-12 DIAGNOSIS — I482 Chronic atrial fibrillation: Secondary | ICD-10-CM

## 2015-06-12 DIAGNOSIS — J810 Acute pulmonary edema: Secondary | ICD-10-CM

## 2015-06-12 DIAGNOSIS — I480 Paroxysmal atrial fibrillation: Secondary | ICD-10-CM

## 2015-06-12 LAB — RENAL FUNCTION PANEL
Albumin: 1.7 g/dL — ABNORMAL LOW (ref 3.5–5.0)
Albumin: 1.9 g/dL — ABNORMAL LOW (ref 3.5–5.0)
Anion gap: 15 (ref 5–15)
Anion gap: 13 (ref 5–15)
BUN: 50 mg/dL — ABNORMAL HIGH (ref 6–20)
BUN: 50 mg/dL — ABNORMAL HIGH (ref 6–20)
CO2: 25 mmol/L (ref 22–32)
CO2: 24 mmol/L (ref 22–32)
Calcium: 8.3 mg/dL — ABNORMAL LOW (ref 8.9–10.3)
Calcium: 8.4 mg/dL — ABNORMAL LOW (ref 8.9–10.3)
Chloride: 99 mmol/L — ABNORMAL LOW (ref 101–111)
Chloride: 103 mmol/L (ref 101–111)
Creatinine, Ser: 6.27 mg/dL — ABNORMAL HIGH (ref 0.61–1.24)
Creatinine, Ser: 5.95 mg/dL — ABNORMAL HIGH (ref 0.61–1.24)
GFR calc Af Amer: 10 mL/min — ABNORMAL LOW (ref >60)
GFR calc Af Amer: 11 mL/min — ABNORMAL LOW (ref >60)
GFR calc non Af Amer: 9 mL/min — ABNORMAL LOW (ref >60)
GFR calc non Af Amer: 9 mL/min — ABNORMAL LOW (ref >60)
Glucose, Bld: 95 mg/dL (ref 65–99)
Glucose, Bld: 88 mg/dL (ref 65–99)
Phosphorus: 6.8 mg/dL — ABNORMAL HIGH (ref 2.5–4.6)
Phosphorus: 6.6 mg/dL — ABNORMAL HIGH (ref 2.5–4.6)
Potassium: 4.8 mmol/L (ref 3.5–5.1)
Potassium: 5.5 mmol/L — ABNORMAL HIGH (ref 3.5–5.1)
Sodium: 139 mmol/L (ref 135–145)
Sodium: 140 mmol/L (ref 135–145)

## 2015-06-12 LAB — CBC
HCT: 28.1 % — ABNORMAL LOW (ref 39.0–52.0)
Hemoglobin: 9.4 g/dL — ABNORMAL LOW (ref 13.0–17.0)
MCH: 29 pg (ref 26.0–34.0)
MCHC: 33.5 g/dL (ref 30.0–36.0)
MCV: 86.7 fL (ref 78.0–100.0)
Platelets: 39 10*3/uL — ABNORMAL LOW (ref 150–400)
RBC: 3.24 MIL/uL — ABNORMAL LOW (ref 4.22–5.81)
RDW: 17 % — ABNORMAL HIGH (ref 11.5–15.5)
WBC: 11.9 10*3/uL — ABNORMAL HIGH (ref 4.0–10.5)

## 2015-06-12 LAB — POCT I-STAT 3, ART BLOOD GAS (G3+)
Acid-base deficit: 1 mmol/L (ref 0.0–2.0)
Bicarbonate: 24.4 mEq/L — ABNORMAL HIGH (ref 20.0–24.0)
O2 Saturation: 90 %
Patient temperature: 98.6
TCO2: 26 mmol/L (ref 0–100)
pCO2 arterial: 42.6 mmHg (ref 35.0–45.0)
pH, Arterial: 7.366 (ref 7.350–7.450)
pO2, Arterial: 61 mmHg — ABNORMAL LOW (ref 80.0–100.0)

## 2015-06-12 LAB — TROPONIN I: Troponin I: 0.04 ng/mL — ABNORMAL HIGH (ref <0.031)

## 2015-06-12 MED ORDER — PRISMASOL BGK 4/2.5 32-4-2.5 MEQ/L IV SOLN
INTRAVENOUS | Status: DC
  Administered 2015-06-12 – 2015-06-13 (×2): via INTRAVENOUS_CENTRAL
  Filled 2015-06-12 (×3): qty 5000

## 2015-06-12 MED ORDER — DIGOXIN 0.25 MG/ML IJ SOLN
0.5000 mg | Freq: Once | INTRAMUSCULAR | Status: AC
  Administered 2015-06-12: 0.5 mg via INTRAVENOUS
  Filled 2015-06-12: qty 2

## 2015-06-12 MED ORDER — LEVALBUTEROL HCL 0.63 MG/3ML IN NEBU: 0.6300 mg | INHALATION_SOLUTION | Freq: Four times a day (QID) | RESPIRATORY_TRACT | Status: DC | PRN

## 2015-06-12 MED ORDER — HEPARIN SODIUM (PORCINE) 1000 UNIT/ML DIALYSIS
1000.0000 [IU] | INTRAMUSCULAR | Status: DC | PRN
  Administered 2015-06-17: 2800 [IU] via INTRAVENOUS_CENTRAL
  Filled 2015-06-12 (×2): qty 6
  Filled 2015-06-12: qty 3

## 2015-06-12 MED ORDER — PRISMASOL BGK 4/2.5 32-4-2.5 MEQ/L IV SOLN
INTRAVENOUS | Status: DC
  Administered 2015-06-12 – 2015-06-17 (×31): via INTRAVENOUS_CENTRAL
  Filled 2015-06-12 (×49): qty 5000

## 2015-06-12 MED ORDER — PRISMASOL BGK 4/2.5 32-4-2.5 MEQ/L IV SOLN
INTRAVENOUS | Status: DC
  Administered 2015-06-12: 11:00:00 via INTRAVENOUS_CENTRAL
  Filled 2015-06-12 (×2): qty 5000

## 2015-06-12 MED ORDER — MAGNESIUM SULFATE 2 GM/50ML IV SOLN
2.0000 g | Freq: Once | INTRAVENOUS | Status: AC
  Administered 2015-06-12: 2 g via INTRAVENOUS
  Filled 2015-06-12: qty 50

## 2015-06-12 MED ORDER — HEPARIN (PORCINE) 2000 UNITS/L FOR CRRT
INTRAVENOUS_CENTRAL | Status: DC | PRN
  Filled 2015-06-12: qty 1000

## 2015-06-12 MED ORDER — PRISMASOL BGK 4/2.5 32-4-2.5 MEQ/L IV SOLN
INTRAVENOUS | Status: DC
  Administered 2015-06-12 (×2): via INTRAVENOUS_CENTRAL
  Filled 2015-06-12 (×9): qty 5000

## 2015-06-12 NOTE — Progress Notes (Signed)
[facility] [place]  Subjective  - feels ok   Objective 108/65 128 98.9 F (37.2 C) (Oral) 20 89%  Intake/Output Summary (Last 24 hours) at 06/12/15 [id number] Last data filed at 06/12/15 0700  Gross per 24 hour  Intake   1192 ml  Output   1600 ml  Net   -408 ml   Some tachycardia during HD but overall stable 10 cc of urine currently in Foley Still on non rebreather mask sat 90s Abdomen soft no real bowel sounds, incision intact Feet warm  Assessment/Planning: S/p AAA repair and left renal bypass Still essentially anuric but some urine in Foley today.  Continue PRN dialysis per renal Still with poor oxygenation overall but no increased requirements Thrombocytopenia unknown etiology will send HIT profile but may just be equilibration from OR, currently no bleeding afib on amiodarone Ileus keep NG for now hopefully some return of gut function next 24-48 hours Leukocytosis improved trend for now no obvious signs of infection  Ruta Hinds 06/12/2015 7:28 AM --  Laboratory Lab Results:  Recent Labs  06/11/15 0700 06/12/15 0425  WBC 20.7* 11.9*  HGB 10.4* 9.4*  HCT 32.6* 28.1*  PLT 47* 39*   BMET  Recent Labs  06/11/15 0305 06/12/15 0425  NA 143 139  K 5.1 4.8  CL 105 99*  CO2 22 25  GLUCOSE 134* 95  BUN 48* 50*  CREATININE 5.98* 6.27*  CALCIUM 7.7* 8.3*    COAG Lab Results  Component Value Date   INR 1.39 06/10/2015   INR 1.40 06/09/2015   INR 1.07 06/09/2015   No results found for: PTT

## 2015-06-12 NOTE — Progress Notes (Signed)
OT Cancellation Note  Patient Details Name: [name] [name] MRN: [id number] DOB: 29-Nov-1950   Cancelled Treatment:    Reason Eval/Treat Not Completed: Patient not medically ready (Pt currently receiving dialysis. Will continue to follow.)  [name] 06/12/2015, 12:55 PM

## 2015-06-12 NOTE — Progress Notes (Signed)
RT had to discontinue xopenex treatment due to Oxygen desat to 85% on aerosol mask.  Pt placed back on non-rebreather with eventual climb back to 90%.  Pt exhibits weak/ineffective cough with course breath sounds bilaterally.  NT suction order added per protocol assessment.  Suction right nare with large amount of thick green/tan secretions returned.  O2 desat to 88% during, which recovered to 90% after procedure.

## 2015-06-12 NOTE — Consult Note (Signed)
Primary cardiologist: Dr Bronson Ing  HPI: 65 year old male with past medical history of permanent atrial fibrillation, coronary artery disease, hypertension, peripheral vascular disease, cerebrovascular disease and now s/p repair of AAA and left renal artery bypass for evaluation of atrial fibrillation and CHF. Last echocardiogram August 2009 showed ejection fraction 45% with posterior wall hypokinesis. There was mild mitral regurgitation and mild left atrial enlargement. Cardiac MRI February 2012 showed ejection fraction 40% with inferior akinesis. There was concern of thrombus in the inferior apical wall. Patient has had previous myocardial infarction and PCI. Last catheterization May 2012 showed patent stent in his circumflex and chronically occluded right coronary artery. There were 2 small diagonal branches with 50-70% ostial lesions. Medical therapy recommended. Patient had his surgical procedure on February 6. Procedure complicated by respiratory failure and acute renal failure (felt related to hypovolemia - EBL 4 L and hypotension). At time of evaluation patient denies dyspnea or chest pain. He complains of abdominal pain. He is slightly confused.  Medications Prior to Admission  Medication Sig Dispense Refill  . albuterol (PROVENTIL HFA;VENTOLIN HFA) 108 (90 BASE) MCG/ACT inhaler Inhale 2-4 puffs into the lungs every 4 (four) hours as needed for wheezing or shortness of breath. 1 Inhaler 0  . amLODipine (NORVASC) 10 MG tablet take 1 tablet by mouth once daily 90 tablet 3  . atorvastatin (LIPITOR) 20 MG tablet Take 1 tablet (20 mg total) by mouth at bedtime. 30 tablet 3  . cloNIDine (CATAPRES) 0.1 MG tablet Take 1 tablet (0.1 mg total) by mouth 2 (two) times daily. 180 tablet 3  . lisinopril (PRINIVIL,ZESTRIL) 40 MG tablet Take 1 tablet (40 mg total) by mouth daily. 90 tablet 3  . metoprolol tartrate (LOPRESSOR) 25 MG tablet Take 1 tablet (25 mg total) by mouth 2 (two) times daily. 60 tablet  0  . Multiple Vitamin (MULITIVITAMIN WITH MINERALS) TABS Take 1 tablet by mouth daily.    . Omega-3 Fatty Acids (FISH OIL) 1000 MG CAPS Take 1,000 mg by mouth daily.   0  . pantoprazole (PROTONIX) 40 MG tablet Take 40 mg by mouth daily.    [name] warfarin (COUMADIN) 5 MG tablet Take 1 1/2 tablets daily (Patient taking differently: Take 5-7.5 mg by mouth daily. Take one tablet on all days except on Wednesdays and Thursdays take one and one-half tablets) 45 tablet 6  . diphenhydrAMINE (BENADRYL) 25 MG tablet Take 25 mg by mouth daily as needed for allergies.    . fexofenadine (ALLEGRA) 180 MG tablet Take 1 tablet (180 mg total) by mouth daily. 30 tablet 2  . Multiple Vitamin (ONE-A-DAY MENS PO) Take 1 tablet by mouth daily.    . nitroGLYCERIN (NITROSTAT) 0.4 MG SL tablet place 1 tablet under the tongue if needed every 5 minutes for chest pain for 3 doses IF NO RELIEF AFTER 3RD DOSE CALL PRESCRIBER OR 911. 25 tablet 2  . polyethylene glycol-electrolytes (NULYTELY/GOLYTELY) 420 g solution Take 4,000 mLs by mouth once. (Patient not taking: Reported on 06/03/2015) 4000 mL 0  . traZODone (DESYREL) 50 MG tablet Take 0.5-1 tablets (25-50 mg total) by mouth at bedtime as needed for sleep. (Patient taking differently: Take 50 mg by mouth at bedtime as needed for sleep. ) 30 tablet 3    No Known Allergies  Past Medical History  Diagnosis Date  . Arteriosclerotic cardiovascular disease (ASCVD)     AMI in 2000 treated at [facility]; cath in 12/2006->  Chronic total obstruction of the RCA;  drug-eluting stent  placed in M1; inferior hypokinesis with an EF of 45%  . Tobacco abuse, in remission     20 pack years; quit in 2009  . Hypertension   . Chronic anticoagulation   . Atrial fibrillation ([place])     on coumadin for couple of years, stopped plavix at that time  . PVD (peripheral vascular disease) ([place])     Ct angiogram in 2009 revealed stable disease with 80% celiac stenosis,50% right renal artery ,ASCVD with  ulceration in the abdominal aortashe  . Nephrolithiasis   . Cerebrovascular disease 2002    carotid stent  . Hyperlipidemia   . Myocardial infarction ([place]) 10 yrs ago  . Testicular carcinoma ([place]) 1990    right orchiectomy  . AAA (abdominal aortic aneurysm) ([place]) 2017    3.3cm  . Dysrhythmia   . Shortness of breath dyspnea     'sometimes'  . GERD (gastroesophageal reflux disease)     Past Surgical History  Procedure Laterality Date  . Appendectomy  2004  . Testicular cancer  1990    right orchiectomy  . Colonoscopy  06/17/2011    INCOMPLETE, PREP POOR. Procedure: COLONOSCOPY;  Surgeon: Daneil Dolin, MD;  [facility];  Service: Endoscopy;  Laterality: N/A;  10:00  . Esophagogastroduodenoscopy  06/17/2011    severe erosive/ulcerative reflux esophagitis, soft noncritical stricture dilatied, small hh, antral erosion   . Colonoscopy  07/15/2011    [id number] rectal and colon polyps  . Colonoscopy N/A 05/22/2015    Procedure: COLONOSCOPY;  Surgeon: Daneil Dolin, MD;  [facility];  Service: Endoscopy;  Laterality: N/A;  1300 - moved to 2:30 - office to notify  . Esophagogastroduodenoscopy N/A 05/22/2015    Procedure: ESOPHAGOGASTRODUODENOSCOPY (EGD);  Surgeon: Daneil Dolin, MD;  [facility];  Service: Endoscopy;  Laterality: N/A;  . Esophageal dilation N/A 05/22/2015    Procedure: ESOPHAGEAL DILATION;  Surgeon: Daneil Dolin, MD;  [facility];  Service: Endoscopy;  Laterality: N/A;  . Peripheral vascular catheterization N/A 05/28/2015    Procedure: Abdominal Aortogram;  Surgeon: Serafina Mitchell, MD;  Location: [facility];  Service: Cardiovascular;  Laterality: N/A;  . Abdominal aortic aneurysm repair N/A 06/09/2015    Procedure: ANEURYSM ABDOMINAL AORTIC REPAIR;  Surgeon: Elam Dutch, MD;  Location: [facility];  Service: Vascular;  Laterality: N/A;  . Aortic/renal bypass Left 06/09/2015    Procedure: LEFT RENAL Artery BYPASS;   Surgeon: Elam Dutch, MD;  Location: [place] [facility];  Service: Vascular;  Laterality: Left;  . Aortic endarterecetomy N/A 06/09/2015    Procedure: AORTIC ENDARTERECETOMY;  Surgeon: Elam Dutch, MD;  [facility];  Service: Vascular;  Laterality: N/A;    Social History   Social History  . Marital Status: Married    Spouse Name: N/A  . Number of Children: 2  . Years of Education: N/A   Occupational History  . disabled    Social History Main Topics  . Smoking status: Former Smoker -- 0.10 packs/day for 40 years    Types: Cigarettes    Quit date: 05/24/2015  . Smokeless tobacco: Never Used     Comment: Patient smokes up to 5 cigarettes in a week  . Alcohol Use: No  . Drug Use: No  . Sexual Activity: Yes    Birth Control/ Protection: None   Other Topics Concern  . Not on file   Social History Narrative    Family History  Problem  Relation Age of Onset  . Colon cancer Father 23    deceased  . Prostate cancer Father   . Liver disease Neg Hx   . Anesthesia problems Neg Hx   . Hypotension Neg Hx   . Malignant hyperthermia Neg Hx   . Pseudochol deficiency Neg Hx     ROS: complains of abdominal pain but no fever chills, productive cough, hemoptysis, dysphasia, odynophagia, melena, hematochezia, dysuria, hematuria, rash, seizure activity, orthopnea, PND, laudication. Remaining systems are negative.  Physical Exam:   Blood pressure 102/62, pulse 119, temperature 99 F (37.2 C), temperature source Axillary, resp. rate 24, height 6' (1.829 m), weight 213 lb 3 oz (96.7 kg), SpO2 98 %.  General:  Well developed/well nourished in mild respiratory distress; anasarca Skin warm/dry Patient not depressed; mildly confused No peripheral clubbing Back-not examined HEENT-normal/normal eyelids Neck supple/normal carotid upstroke bilaterally; no thyromegaly chest - diffuse rhonchi CV - irregular and tachycardic/normal S1 and S2; no murmurs, rubs or gallops;  PMI nondisplaced Abdomen  -s/p abdominal surgery; incision without evidence of infection 2+ femoral pulses Ext-1+ edema Neuro-grossly nonfocal  ECG Atrial fibrillation with rapid ventricular response, inferior infarct.   Results for orders placed or performed during the hospital encounter of 06/09/15 (from the past 48 hour(s))  I-STAT, chem 8     Status: Abnormal   Collection Time: 06/10/15  1:23 PM  Result Value Ref Range   Sodium 141 135 - 145 mmol/L   Potassium 5.2 (H) 3.5 - 5.1 mmol/L   Chloride 107 101 - 111 mmol/L   BUN 39 (H) 6 - 20 mg/dL   Creatinine, Ser 4.50 (H) 0.61 - 1.24 mg/dL   Glucose, Bld 152 (H) 65 - 99 mg/dL   Calcium, Ion 1.03 (L) 1.13 - 1.30 mmol/L   TCO2 18 0 - 100 mmol/L   Hemoglobin 12.2 (L) 13.0 - 17.0 g/dL   HCT 36.0 (L) 39.0 - 52.0 %  Renal function panel     Status: Abnormal   Collection Time: 06/11/15  3:05 AM  Result Value Ref Range   Sodium 143 135 - 145 mmol/L   Potassium 5.1 3.5 - 5.1 mmol/L   Chloride 105 101 - 111 mmol/L   CO2 22 22 - 32 mmol/L   Glucose, Bld 134 (H) 65 - 99 mg/dL   BUN 48 (H) 6 - 20 mg/dL   Creatinine, Ser 5.98 (H) 0.61 - 1.24 mg/dL   Calcium 7.7 (L) 8.9 - 10.3 mg/dL   Phosphorus 6.7 (H) 2.5 - 4.6 mg/dL   Albumin 1.9 (L) 3.5 - 5.0 g/dL   GFR calc non Af Amer 9 (L) >60 mL/min   GFR calc Af Amer 10 (L) >60 mL/min    Comment: (NOTE) The eGFR has been calculated using the CKD EPI equation. This calculation has not been validated in all clinical situations. eGFR's persistently <60 mL/min signify possible Chronic Kidney Disease.    Anion gap 16 (H) 5 - 15  Magnesium     Status: Abnormal   Collection Time: 06/11/15  3:05 AM  Result Value Ref Range   Magnesium 1.6 (L) 1.7 - 2.4 mg/dL  CBC     Status: Abnormal   Collection Time: 06/11/15  7:00 AM  Result Value Ref Range   WBC 20.7 (H) 4.0 - 10.5 K/uL   RBC 3.75 (L) 4.22 - 5.81 MIL/uL   Hemoglobin 10.4 (L) 13.0 - 17.0 g/dL   HCT 32.6 (L) 39.0 - 52.0 %   MCV 86.9 78.0 -  100.0 fL   MCH 27.7 26.0 -  34.0 pg   MCHC 31.9 30.0 - 36.0 g/dL   RDW 16.7 (H) 11.5 - 15.5 %   Platelets 47 (L) 150 - 400 K/uL    Comment: REPEATED TO VERIFY  Renal function panel     Status: Abnormal   Collection Time: 06/12/15  4:25 AM  Result Value Ref Range   Sodium 139 135 - 145 mmol/L   Potassium 4.8 3.5 - 5.1 mmol/L   Chloride 99 (L) 101 - 111 mmol/L   CO2 25 22 - 32 mmol/L   Glucose, Bld 95 65 - 99 mg/dL   BUN 50 (H) 6 - 20 mg/dL   Creatinine, Ser 6.27 (H) 0.61 - 1.24 mg/dL   Calcium 8.3 (L) 8.9 - 10.3 mg/dL   Phosphorus 6.8 (H) 2.5 - 4.6 mg/dL   Albumin 1.7 (L) 3.5 - 5.0 g/dL   GFR calc non Af Amer 9 (L) >60 mL/min   GFR calc Af Amer 10 (L) >60 mL/min    Comment: (NOTE) The eGFR has been calculated using the CKD EPI equation. This calculation has not been validated in all clinical situations. eGFR's persistently <60 mL/min signify possible Chronic Kidney Disease.    Anion gap 15 5 - 15  CBC     Status: Abnormal   Collection Time: 06/12/15  4:25 AM  Result Value Ref Range   WBC 11.9 (H) 4.0 - 10.5 K/uL   RBC 3.24 (L) 4.22 - 5.81 MIL/uL   Hemoglobin 9.4 (L) 13.0 - 17.0 g/dL   HCT 28.1 (L) 39.0 - 52.0 %   MCV 86.7 78.0 - 100.0 fL   MCH 29.0 26.0 - 34.0 pg   MCHC 33.5 30.0 - 36.0 g/dL   RDW 17.0 (H) 11.5 - 15.5 %   Platelets 39 (L) 150 - 400 K/uL    Comment: SPECIMEN CHECKED FOR CLOTS REPEATED TO VERIFY CONSISTENT WITH PREVIOUS RESULT   I-STAT 3, arterial blood gas (G3+)     Status: Abnormal   Collection Time: 06/12/15  9:40 AM  Result Value Ref Range   pH, Arterial 7.366 7.350 - 7.450   pCO2 arterial 42.6 35.0 - 45.0 mmHg   pO2, Arterial 61.0 (L) 80.0 - 100.0 mmHg   Bicarbonate 24.4 (H) 20.0 - 24.0 mEq/L   TCO2 26 0 - 100 mmol/L   O2 Saturation 90.0 %   Acid-base deficit 1.0 0.0 - 2.0 mmol/L   Patient temperature 98.6 F    Collection site RADIAL, ALLEN'S TEST ACCEPTABLE    Drawn by Operator    Sample type ARTERIAL     Dg Chest Port 1 View  06/12/2015  CLINICAL DATA:  Shortness  of breath and hypertension EXAM: PORTABLE CHEST 1 VIEW COMPARISON:  June 11, 2015 FINDINGS: Swan-Ganz catheter has been removed. Cordis tip is in the superior vena cava. Left central catheter tip is in the superior vena cava. Nasogastric tube tip and side port are below the diaphragm. No pneumothorax. There are bilateral pleural effusions with bibasilar atelectasis. There is mild underlying interstitial edema. There is cardiomegaly. The pulmonary vascularity appears within normal limits. IMPRESSION: Tube and catheter positions as described without pneumothorax. Evidence of a degree of congestive heart failure. Electronically Signed   By: Lowella Grip III M.D.   On: 06/12/2015 08:00   Dg Chest Port 1 View  06/11/2015  CLINICAL DATA:  Recent abdominal aortic aneurysm repair ; status post extubation EXAM: PORTABLE CHEST 1 VIEW COMPARISON:  June 10, 2015 FINDINGS: Endotracheal tube has been removed. Swan-Ganz catheter tip is in the proximal right main pulmonary artery. Nasogastric tube tip and side port in stomach. Left jugular catheter tip is in the superior vena cava. No pneumothorax. There is no edema or consolidation. Heart is borderline enlarged with pulmonary vascularity within normal limits, stable. No adenopathy evident. IMPRESSION: Tube and catheter positions as described without pneumothorax. No edema or consolidation. Stable cardiac prominence. Electronically Signed   By: Lowella Grip III M.D.   On: 06/11/2015 07:21   Dg Chest Port 1 View  06/10/2015  CLINICAL DATA:  Encounter for central line placement. EXAM: PORTABLE CHEST 1 VIEW COMPARISON:  06/10/2015 at 5:39 a.m. FINDINGS: New dual-lumen left internal jugular central venous line has been placed. The tip projects in the mid to lower superior vena cava. There is no pneumothorax. Right internal jugular Swan-Ganz catheter, oral/nasogastric tube and endotracheal tube are stable in well positioned. Prominent interstitial markings  bilaterally are stable. IMPRESSION: 1. New left internal jugular dual-lumen central venous catheter with its tip in the mid to lower superior vena cava. No pneumothorax. No other change. Electronically Signed   By: Lajean Manes M.D.   On: 06/10/2015 12:37    Assessment/Plan 1 permanent atrial fibrillation-patient has long-standing atrial fibrillation. He typically takes metoprolol at home. His rate is elevated here likely driven by acute anemia and pain. His heart rate is in the 120 range. We will continue amiodarone for rate control. Continue IV metoprolol as tolerated by blood pressure. We cannot advance this as blood pressure is borderline and he will require dialysis as well for acute renal failure. I will give a one-time dose of digoxin 0.5 mg IV. Given acute renal failure we will not continue with this on a running basis. His heart rate should improve as his overall medical condition improves. Given thrombocytopenia would hold all anticoagulation. We will resume after he improves. We will arrange a repeat echocardiogram. Check TSH. 2 acute on chronic combined systolic/diastolic congestive heart failure-the patient is volume overloaded on examination. Repeat echocardiogram. The majority of his volume is likely secondary to acute renal failure. He is anuric and dialysis is being initiated. 3 acute renal failure-nephrology following an dialysis to be initiated. 4 acute respiratory failure-pulmonary following. High risk for reintubation. 5 status post repair of abdominal aortic aneurysm/renal bypass-management per vascular surgery. 6 history of hypertension-all blood pressure medications are on hold other than metoprolol. We will adjust as he improves. 7 history of coronary artery disease-Resume statin at discharge. 8 acute blood loss anemia-follow hemoglobin. 9 thrombocytopenia-Agree with HITT panel.  Kirk Ruths MD 06/12/2015, 9:53 AM

## 2015-06-12 NOTE — Care Management Important Message (Signed)
Important Message  Patient Details  Name: [name] [name] MRN: [id number] Date of Birth: [birth year]   Medicare Important Message Given:  Yes    Loann Quill 06/12/2015, 8:06 AM

## 2015-06-12 NOTE — Progress Notes (Signed)
Pt unable to be seen for initial PT evaluation again for the 3rd day.  Has now been started on CRRT and not likely to be able to been seen for several days.  Therefore will sign off for PT and will ask physicians to reorder therapy when pt is appropriated for mobility.  Thanks. 06/12/2015  Donnella Sham, [place] [phone]  (pager)

## 2015-06-12 NOTE — Progress Notes (Signed)
[facility] Progress Note    Assessment/ Plan:   1. 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  Prior to 1/21, the patient's renal function was normal (Cr 1.21 on 04/09/15). Cr continued to rise.  Continues to be anuric. A left central venous dialysis catheter was placed on 2/7 in preparation for dialysis.  Patient tolerated HD well with 1L fluid removal (however HR did increase to 130-150s during that time, BPs dropped to 90/50s, patient asymptomatic). Would consider holding AM dose of metoprolol prior to HD.  Concerns from CCM about HITT (panel pending) however still on Lovenox? Consider progressing without heparin.   2. Anion gap metabolic acidosis: most likely secondary to ARF, resolved with HD.  3. Hypomagnesemia: Repleted previously, will f/u tomorrow.   4. Hyperkalemia: 5.1>4.8 after dialysis. Continue to monitor.    5. Hypertension: BPs have been labile while here, I suspect partially secondary to initially RAS and then  acute blood loss. Currently 150s/60s.  5. Atrial fibrillation: patient in Afib with RVR, still on an amio gtt, HRs 110s currently.   Subjective:   Patient breathing is stable. Stable pain at incision site. Tolerated HD well- asymptomatic.    Objective:   BP 112/61 mmHg  Pulse 127  Temp(Src) 99 F (37.2 C) (Axillary)  Resp 11  Ht 6' (1.829 m)  Wt 213 lb 3 oz (96.7 kg)  BMI 28.91 kg/m2  SpO2 91%  Intake/Output Summary (Last 24 hours) at 06/12/15 0845 Last data filed at 06/12/15 0800  Gross per 24 hour  Intake 1100.4 ml  Output   1615 ml  Net -514.6 ml   Weight change:   Physical Exam: General: Lying in bed on NRB  Left IJ catheter in place and c/d/i.  Cardiovascular: Tachycardic in the 110s, irregular rhythm. No  murmurs, rubs, or gallops noted.  Respiratory: No increased WOB. On NRB.Transmitted upper airway sounds. Abdomen:Hypoactive bowel sounds, soft, some tenderness near incision site, clean, dry, closed with staples.  MSK: No pitting  edema noted.   Imaging: Dg Chest Port 1 View  06/12/2015  CLINICAL DATA:  Shortness of breath and hypertension EXAM: PORTABLE CHEST 1 VIEW COMPARISON:  June 11, 2015 FINDINGS: Swan-Ganz catheter has been removed. Cordis tip is in the superior vena cava. Left central catheter tip is in the superior vena cava. Nasogastric tube tip and side port are below the diaphragm. No pneumothorax. There are bilateral pleural effusions with bibasilar atelectasis. There is mild underlying interstitial edema. There is cardiomegaly. The pulmonary vascularity appears within normal limits. IMPRESSION: Tube and catheter positions as described without pneumothorax. Evidence of a degree of congestive heart failure. Electronically Signed   By: Lowella Grip III M.D.   On: 06/12/2015 08:00   Dg Chest Port 1 View  06/11/2015  CLINICAL DATA:  Recent abdominal aortic aneurysm repair ; status post extubation EXAM: PORTABLE CHEST 1 VIEW COMPARISON:  June 10, 2015 FINDINGS: Endotracheal tube has been removed. Swan-Ganz catheter tip is in the proximal right main pulmonary artery. Nasogastric tube tip and side port in stomach. Left jugular catheter tip is in the superior vena cava. No pneumothorax. There is no edema or consolidation. Heart is borderline enlarged with pulmonary vascularity within normal limits, stable. No adenopathy evident. IMPRESSION: Tube and catheter positions as described without pneumothorax. No edema or  consolidation. Stable cardiac prominence. Electronically Signed   By: Lowella Grip III M.D.   On: 06/11/2015 07:21   Dg Chest Port 1 View  06/10/2015  CLINICAL DATA:  Encounter for central line placement. EXAM: PORTABLE CHEST 1 VIEW COMPARISON:  06/10/2015 at 5:39 a.m.  FINDINGS: New dual-lumen left internal jugular central venous line has been placed. The tip projects in the mid to lower superior vena cava. There is no pneumothorax. Right internal jugular Swan-Ganz catheter, oral/nasogastric tube and endotracheal tube are stable in well positioned. Prominent interstitial markings bilaterally are stable. IMPRESSION: 1. New left internal jugular dual-lumen central venous catheter with its tip in the mid to lower superior vena cava. No pneumothorax. No other change. Electronically Signed   By: Lajean Manes M.D.   On: 06/10/2015 12:37    Labs: BMET  Recent Labs Lab 06/05/15 1600  06/09/15 1435 06/09/15 1441 06/09/15 1655 06/10/15 0440 06/10/15 1323 06/11/15 0305 06/12/15 0425  NA 140  < > 141 140 137 141 141 143 139  K 4.2  < > 5.1 5.1 5.3* 5.7* 5.2* 5.1 4.8  CL 104  --   --   --  102 108 107 105 99*  CO2 22  --   --   --  24 17*  --  22 25  GLUCOSE 81  --   --  163* 124* 158* 152* 134* 95  BUN 12  --   --   --  18 27* 39* 48* 50*  CREATININE 1.27*  --   --   --  2.34* 3.85* 4.50* 5.98* 6.27*  CALCIUM 9.9  --   --   --  8.4* 7.8*  --  7.7* 8.3*  PHOS  --   --   --   --   --   --   --  6.7* 6.8*  < > = values in this interval not displayed. CBC  Recent Labs Lab 06/09/15 1655 06/10/15 0440 06/10/15 1323 06/11/15 0700 06/12/15 0425  WBC 17.7* 19.4*  --  20.7* 11.9*  HGB 12.7* 12.8* 12.2* 10.4* 9.4*  HCT 38.5* 39.5 36.0* 32.6* 28.1*  MCV 85.4 86.4  --  86.9 86.7  PLT 99* 88*  --  47* 39*    Medications:    . antiseptic oral rinse  7 mL Mouth Rinse q12n4p  . chlorhexidine  15 mL Mouth Rinse BID  . docusate sodium  100 mg Oral Daily  . enoxaparin (LOVENOX) injection  30 mg Subcutaneous Q24H  . furosemide  160 mg Intravenous Q6H  . levalbuterol  0.63 mg Nebulization Q4H  . metoprolol  5 mg Intravenous 4 times per day  . pantoprazole (PROTONIX) IV  40 mg Intravenous QHS      Kathrine Cords, MD [place] Resident,  PGY-2 06/12/2015, 8:45 AM  I have seen and examined this patient and agree with plan per Dr Lorenso Courier.  Had HD last night but tachycardia made it difficult to remove fluid.  UO poor.  CXR showing increased fluid.  Will start CVVHD as he will tolerate this better.   Spoke with Dr Oneida Alar and he says we can use heparin if needed. Benard Minturn T,MD 06/12/2015 9:40 AM

## 2015-06-12 NOTE — Plan of Care (Signed)
Dr. Bing Ree called re: K 5.5

## 2015-06-12 NOTE — Progress Notes (Signed)
PULMONARY / CRITICAL CARE MEDICINE   Name: [name] [name] MRN: [id number] DOB: 12-20-1950    ADMISSION DATE:  06/09/2015 CONSULTATION DATE:  06/09/2015  REFERRING MD:  Vascular Surgery, Dr. Oneida Alar  CHIEF COMPLAINT:  Post op VDRF  BRIEF :  65 year old male admitted on January 20 to [facility] with abdominal pain, found to have an abdominal aneurysm occluding the right renal artery.  He underwent an elective surgical repair including an aorto to left renal bypass procedure on 06/09/2015.  Pulmonary and critical care medicine was consulted for postoperative respiratory failure, renal failure, and anemia.   SUBJECTIVE:  HD yesterday > tachycardic to 150's, 1 liter volume removed Remains hypoxemic More confused today   VITAL SIGNS: BP 112/61 mmHg  Pulse 127  Temp(Src) 99 F (37.2 C) (Axillary)  Resp 11  Ht 6' (1.829 m)  Wt 213 lb 3 oz (96.7 kg)  BMI 28.91 kg/m2  SpO2 91%  HEMODYNAMICS: PAP: (35)/(17) 35/17 mmHg CVP:  [10 mmHg] 10 mmHg  VENTILATOR SETTINGS:    INTAKE / OUTPUT: I/O last 3 completed shifts: In: 2591.7 [I.V.:2121.7; NG/GT:30; IV [id number] Out: 2000 [Emesis/NG output:1000; Other:1000]  PHYSICAL EXAMINATION: General:  Awake, sitting up in bed HEENT: NCAT OP clear NRB mask PULM: crackles bases, increased effort but no accessory muscle use CV: Irreg irreg, no mgr GI: BS infrequent, soft, midline scar MSK: Normal bulk and tone Neuro: awake, interactive but speech more slurred today,   LABS:  BMET  Recent Labs Lab 06/10/15 0440 06/10/15 1323 06/11/15 0305 06/12/15 0425  NA 141 141 143 139  K 5.7* 5.2* 5.1 4.8  CL 108 107 105 99*  CO2 17*  --  22 25  BUN 27* 39* 48* 50*  CREATININE 3.85* 4.50* 5.98* 6.27*  GLUCOSE 158* 152* 134* 95    Electrolytes  Recent Labs Lab 06/09/15 1655 06/10/15 0440 06/11/15 0305 06/12/15 0425  CALCIUM 8.4* 7.8* 7.7* 8.3*  MG 1.6* 1.3* 1.6*  --   PHOS  --   --  6.7* 6.8*    CBC  Recent  Labs Lab 06/10/15 0440 06/10/15 1323 06/11/15 0700 06/12/15 0425  WBC 19.4*  --  20.7* 11.9*  HGB 12.8* 12.2* 10.4* 9.4*  HCT 39.5 36.0* 32.6* 28.1*  PLT 88*  --  47* 39*    Coag's  Recent Labs Lab 06/09/15 0604 06/09/15 1655 06/10/15 0440  APTT 33 36  --   INR 1.07 1.40 1.39    Sepsis Markers No results for input(s): LATICACIDVEN, PROCALCITON, O2SATVEN in the last 168 hours.  ABG  Recent Labs Lab 06/09/15 1435 06/09/15 1543 06/10/15 0502  PHART 7.324* 7.299* 7.315*  PCO2ART 43.6 54.1* 30.2*  PO2ART 100.0 311.0* 67.0*    Liver Enzymes  Recent Labs Lab 06/05/15 1600 06/09/15 1655 06/10/15 0440 06/11/15 0305 06/12/15 0425  AST 19 67* 172*  --   --   ALT 23 27 71*  --   --   ALKPHOS 85 42 39  --   --   BILITOT 0.3 1.4* 1.0  --   --   ALBUMIN 3.6 2.8* 2.2* 1.9* 1.7*    Cardiac Enzymes No results for input(s): TROPONINI, PROBNP in the last 168 hours.  Glucose  Recent Labs Lab 06/09/15 1656  GLUCAP 106*    Imaging  2/7/2017CXR > ETT in place, left IJ in place, no infiltrate  STUDIES:  CT abdomen 1/21 > Moderate right renal atrophy with hypoperfusion of the right kidney concerning for vascular compromise. Further evaluation  of the right kidney as well as interrogation of the right renal artery with duplex ultrasound or CT angiography recommended. 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 Follow-up recommended. A 3.3 cm infrarenal abdominal aortic aneurysm. CT angio abdomen 1/23 > Enlarging saccular lesion along the left side of the infrarenal abdominal aorta. 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 Critical stenosis or near occlusion of the right renal artery with an atrophic right kidney and delayed contrast enhancement in the right kidney. In addition, there appears to be significant stenosis  involving the proximal left renal artery which may represent a combination of atherosclerotic disease and compression from the left aortic saccular lesion. Abdominal aortogram 1/25 > occluded R renal artery, possible source of pseudoaneurysm on L lateral side of aorta just below renal artery, moderate calcific stenosis and bilateral iliac arteries right greater than left, and diseased suprarenal aorta.  CULTURES:   ANTIBIOTICS: Cefuroxime periop  SIGNIFICANT EVENTS: 2/6 to OR for elective abdominal aortic aneurysm repair, out on vent 2/7 extubated, HD cath placed 2/8 HD > afib with rvr, 1 L removed  LINES/TUBES: RIJ PA cath 2/6 >>> 2/8 L IJ HC cath 2/7 >>>   DISCUSSION: 65 year old male with known AAA recently admitted with abdominal pain. Found to have extension of aneurysm and occlusion of R renal artery. Presented 2/6 for surgical repair under Dr. Oneida Alar. Procedure complicated by 4L blood loss and subsequent transfusions. Post operatively he remained on ventilator in ICU but was extubated easily on 2/7.  Now with pulmonary edema and bilateral pleural effusions.  ASSESSMENT / PLAN:  PULMONARY A: Acute respiratory failure with hypoxemia> due to pulmonary edema and effusions Acute pulmonary edema> think more likely related to anuria He is very high risk for intubation P:   Out of bed  Incentive spirometry Educated on splinting for cough again today Flutter valve Monitor O2 saturation and respiratory status closely in ICU setting Need to remove volume today  CARDIOVASCULAR A:  Abdominal aortic aneurysm s/p open repair 2/6 Afib with RVR, worse on HD, on metoprolol and amiodarone H/o CAD, HTN Anasarca Baseline LVEF 40-45%, given pulmonary edema after major surgery will repeat Echo to ensure no evidence of ischemia/worsening LVEF P:  Cardiology consult > afib with RVR 12 lead Troponin Echo today Telemetry Amiodarone to continue IV metoprolol to continue PRN hydralazine,  labetalol, metoprolol Holding home amlodipine, clonidine, lisinopril  RENAL A:   AKI > oliguric Anasarca, 4 L positive P:   Monitor BMET and UOP Replace electrolytes as needed HD today, consider CVVHD to help remove volume  GASTROINTESTINAL A:   GERD P:   Maintain NPO until oxygenation improved Stop protonix  HEMATOLOGIC A:   Acute blood loss anemia in post operative setting Chronic anticoagulation in setting Afib Thrombocytopenia > due to blood loss? Too early for HITT P:  Daily CBC F/u HITT panel Enoxaparin for VTE ppx Will defer full dose anticoagulation until OK with vascular surgery  INFECTIOUS A:   Surgical prophylaxis Leukocytosis improving, no evidence of infection P:   Cefuroxime Trend WBC and fever curve  ENDOCRINE A:   No acute issues P:   Follow glucose on BMP  NEUROLOGIC A:   Acute encephalopathy in post-operative setting > resolved  P:   PRN morphine, dilaudid for analgesia > use with caution with delirium    FAMILY  - Updates: no family at  bedside, attempted to call wife today number listed didn't work  - Inter-disciplinary family meet or Palliative Care meeting due by:  2/12  My cc time 36 minutes  Roselie Awkward, MD [place] [facility] Pager: [phone] Cell: [phone] After 3pm or if no response, call [phone]

## 2015-06-13 ENCOUNTER — Inpatient Hospital Stay (HOSPITAL_COMMUNITY): Payer: Medicare Other

## 2015-06-13 DIAGNOSIS — J9601 Acute respiratory failure with hypoxia: Secondary | ICD-10-CM | POA: Diagnosis not present

## 2015-06-13 DIAGNOSIS — J9600 Acute respiratory failure, unspecified whether with hypoxia or hypercapnia: Secondary | ICD-10-CM

## 2015-06-13 LAB — TSH: TSH: 1.03 u[IU]/mL (ref 0.350–4.500)

## 2015-06-13 LAB — POCT I-STAT 3, ART BLOOD GAS (G3+)
Acid-Base Excess: 2 mmol/L (ref 0.0–2.0)
Bicarbonate: 27.1 mEq/L — ABNORMAL HIGH (ref 20.0–24.0)
Bicarbonate: 25.7 mEq/L — ABNORMAL HIGH (ref 20.0–24.0)
O2 Saturation: 91 %
O2 Saturation: 100 %
Patient temperature: 98.9
TCO2: 28 mmol/L (ref 0–100)
TCO2: 27 mmol/L (ref 0–100)
pCO2 arterial: 43.1 mmHg (ref 35.0–45.0)
pCO2 arterial: 48.4 mmHg — ABNORMAL HIGH (ref 35.0–45.0)
pH, Arterial: 7.407 (ref 7.350–7.450)
pH, Arterial: 7.334 — ABNORMAL LOW (ref 7.350–7.450)
pO2, Arterial: 62 mmHg — ABNORMAL LOW (ref 80.0–100.0)
pO2, Arterial: 181 mmHg — ABNORMAL HIGH (ref 80.0–100.0)

## 2015-06-13 LAB — RENAL FUNCTION PANEL
Albumin: 1.9 g/dL — ABNORMAL LOW (ref 3.5–5.0)
Albumin: 1.9 g/dL — ABNORMAL LOW (ref 3.5–5.0)
Anion gap: 10 (ref 5–15)
Anion gap: 15 (ref 5–15)
BUN: 43 mg/dL — ABNORMAL HIGH (ref 6–20)
BUN: 37 mg/dL — ABNORMAL HIGH (ref 6–20)
CO2: 26 mmol/L (ref 22–32)
CO2: 23 mmol/L (ref 22–32)
Calcium: 8.1 mg/dL — ABNORMAL LOW (ref 8.9–10.3)
Calcium: 8.4 mg/dL — ABNORMAL LOW (ref 8.9–10.3)
Chloride: 104 mmol/L (ref 101–111)
Chloride: 102 mmol/L (ref 101–111)
Creatinine, Ser: 5.05 mg/dL — ABNORMAL HIGH (ref 0.61–1.24)
Creatinine, Ser: 4.24 mg/dL — ABNORMAL HIGH (ref 0.61–1.24)
GFR calc Af Amer: 13 mL/min — ABNORMAL LOW (ref >60)
GFR calc Af Amer: 16 mL/min — ABNORMAL LOW (ref >60)
GFR calc non Af Amer: 11 mL/min — ABNORMAL LOW (ref >60)
GFR calc non Af Amer: 14 mL/min — ABNORMAL LOW (ref >60)
Glucose, Bld: 83 mg/dL (ref 65–99)
Glucose, Bld: 94 mg/dL (ref 65–99)
Phosphorus: 5.5 mg/dL — ABNORMAL HIGH (ref 2.5–4.6)
Phosphorus: 4.9 mg/dL — ABNORMAL HIGH (ref 2.5–4.6)
Potassium: 5.6 mmol/L — ABNORMAL HIGH (ref 3.5–5.1)
Potassium: 5.5 mmol/L — ABNORMAL HIGH (ref 3.5–5.1)
Sodium: 140 mmol/L (ref 135–145)
Sodium: 140 mmol/L (ref 135–145)

## 2015-06-13 LAB — CBC
HCT: 28.2 % — ABNORMAL LOW (ref 39.0–52.0)
Hemoglobin: 9.1 g/dL — ABNORMAL LOW (ref 13.0–17.0)
MCH: 28 pg (ref 26.0–34.0)
MCHC: 32.3 g/dL (ref 30.0–36.0)
MCV: 86.8 fL (ref 78.0–100.0)
Platelets: 42 10*3/uL — ABNORMAL LOW (ref 150–400)
RBC: 3.25 MIL/uL — ABNORMAL LOW (ref 4.22–5.81)
RDW: 17.6 % — ABNORMAL HIGH (ref 11.5–15.5)
WBC: 10.3 10*3/uL (ref 4.0–10.5)

## 2015-06-13 LAB — GLUCOSE, CAPILLARY
Glucose-Capillary: 74 mg/dL (ref 65–99)
Glucose-Capillary: 88 mg/dL (ref 65–99)

## 2015-06-13 LAB — PROCALCITONIN: Procalcitonin: 35.13 ng/mL

## 2015-06-13 LAB — LACTIC ACID, PLASMA: Lactic Acid, Venous: 1.3 mmol/L (ref 0.5–2.0)

## 2015-06-13 LAB — MAGNESIUM: Magnesium: 2.8 mg/dL — ABNORMAL HIGH (ref 1.7–2.4)

## 2015-06-13 MED ORDER — PIPERACILLIN-TAZOBACTAM 3.375 G IVPB 30 MIN
3.3750 g | Freq: Four times a day (QID) | INTRAVENOUS | Status: DC
  Administered 2015-06-13 – 2015-06-16 (×12): 3.375 g via INTRAVENOUS
  Filled 2015-06-13 (×15): qty 50

## 2015-06-13 MED ORDER — SODIUM CHLORIDE 0.9 % IV SOLN
25.0000 ug/h | INTRAVENOUS | Status: DC
  Administered 2015-06-13 – 2015-06-15 (×3): 100 ug/h via INTRAVENOUS
  Filled 2015-06-13 (×3): qty 50

## 2015-06-13 MED ORDER — ANTISEPTIC ORAL RINSE SOLUTION (CORINZ)
7.0000 mL | OROMUCOSAL | Status: DC
  Administered 2015-06-13 – 2015-06-16 (×34): 7 mL via OROMUCOSAL

## 2015-06-13 MED ORDER — CHLORHEXIDINE GLUCONATE 0.12% ORAL RINSE (MEDLINE KIT)
15.0000 mL | Freq: Two times a day (BID) | OROMUCOSAL | Status: DC
Start: 2015-06-13 — End: 2015-06-16
  Administered 2015-06-13 – 2015-06-16 (×7): 15 mL via OROMUCOSAL

## 2015-06-13 MED ORDER — FENTANYL CITRATE (PF) 100 MCG/2ML IJ SOLN
100.0000 ug | Freq: Once | INTRAMUSCULAR | Status: AC
Start: 2015-06-13 — End: 2015-06-13
  Administered 2015-06-13: 100 ug via INTRAVENOUS
  Filled 2015-06-13: qty 2

## 2015-06-13 MED ORDER — FENTANYL BOLUS VIA INFUSION
50.0000 ug | INTRAVENOUS | Status: DC | PRN
Start: 2015-06-13 — End: 2015-06-15
  Filled 2015-06-13: qty 50

## 2015-06-13 MED ORDER — PRISMASOL BGK 0/2.5 32-2.5 MEQ/L IV SOLN
INTRAVENOUS | Status: DC
  Administered 2015-06-13 – 2015-06-14 (×3): via INTRAVENOUS_CENTRAL
  Filled 2015-06-13 (×4): qty 5000

## 2015-06-13 MED ORDER — INSULIN ASPART 100 UNIT/ML ~~LOC~~ SOLN
0.0000 [IU] | Freq: Four times a day (QID) | SUBCUTANEOUS | Status: DC
  Administered 2015-06-14 – 2015-06-16 (×11): 1 [IU] via SUBCUTANEOUS
  Administered 2015-06-17: 2 [IU] via SUBCUTANEOUS
  Administered 2015-06-17 (×2): 1 [IU] via SUBCUTANEOUS
  Administered 2015-06-17 (×2): 2 [IU] via SUBCUTANEOUS
  Administered 2015-06-18: 1 [IU] via SUBCUTANEOUS

## 2015-06-13 MED ORDER — PANTOPRAZOLE SODIUM 40 MG IV SOLR
40.0000 mg | INTRAVENOUS | Status: DC
  Administered 2015-06-13 – 2015-06-27 (×14): 40 mg via INTRAVENOUS
  Filled 2015-06-13 (×14): qty 40

## 2015-06-13 MED ORDER — LEVALBUTEROL HCL 0.63 MG/3ML IN NEBU
0.6300 mg | INHALATION_SOLUTION | Freq: Four times a day (QID) | RESPIRATORY_TRACT | Status: DC
Start: 2015-06-13 — End: 2015-06-19
  Administered 2015-06-13 – 2015-06-19 (×21): 0.63 mg via RESPIRATORY_TRACT
  Filled 2015-06-13 (×21): qty 3

## 2015-06-13 MED ORDER — MIDAZOLAM HCL 2 MG/2ML IJ SOLN
2.0000 mg | INTRAMUSCULAR | Status: DC | PRN
  Administered 2015-06-13 – 2015-06-15 (×7): 2 mg via INTRAVENOUS
  Filled 2015-06-13 (×7): qty 2

## 2015-06-13 MED ORDER — ETOMIDATE 2 MG/ML IV SOLN
20.0000 mg/kg | Freq: Once | INTRAVENOUS | Status: AC
Start: 2015-06-13 — End: 2015-06-13
  Administered 2015-06-13: 20 mg via INTRAVENOUS

## 2015-06-13 MED ORDER — SODIUM CHLORIDE 0.9% FLUSH
10.0000 mL | Freq: Two times a day (BID) | INTRAVENOUS | Status: DC
  Administered 2015-06-13 – 2015-06-18 (×11): 10 mL
  Administered 2015-06-19: 40 mL
  Administered 2015-06-19 – 2015-06-21 (×5): 10 mL
  Administered 2015-06-22: 20 mL
  Administered 2015-06-23: 10 mL
  Administered 2015-06-23: 40 mL
  Administered 2015-06-24 – 2015-07-06 (×13): 10 mL

## 2015-06-13 MED ORDER — LEVALBUTEROL HCL 0.63 MG/3ML IN NEBU: 0.6300 mg | INHALATION_SOLUTION | RESPIRATORY_TRACT | Status: DC | PRN

## 2015-06-13 MED ORDER — PRISMASOL BGK 0/2.5 32-2.5 MEQ/L IV SOLN
INTRAVENOUS | Status: DC
  Administered 2015-06-13 – 2015-06-14 (×2): via INTRAVENOUS_CENTRAL
  Filled 2015-06-13 (×3): qty 5000

## 2015-06-13 MED ORDER — AMIODARONE HCL IN DEXTROSE 360-4.14 MG/200ML-% IV SOLN
INTRAVENOUS | Status: AC
  Filled 2015-06-13: qty 200

## 2015-06-13 MED ORDER — MIDAZOLAM HCL 2 MG/2ML IJ SOLN
4.0000 mg | Freq: Once | INTRAMUSCULAR | Status: AC
  Administered 2015-06-13: 4 mg via INTRAVENOUS

## 2015-06-13 MED ORDER — MIDAZOLAM HCL 2 MG/2ML IJ SOLN
INTRAMUSCULAR | Status: AC
  Filled 2015-06-13: qty 4

## 2015-06-13 MED ORDER — VANCOMYCIN HCL IN DEXTROSE 1-5 GM/200ML-% IV SOLN
1000.0000 mg | INTRAVENOUS | Status: DC
Start: 2015-06-14 — End: 2015-06-15
  Administered 2015-06-14: 1000 mg via INTRAVENOUS
  Filled 2015-06-13 (×2): qty 200

## 2015-06-13 MED ORDER — IPRATROPIUM BROMIDE 0.02 % IN SOLN
0.5000 mg | Freq: Four times a day (QID) | RESPIRATORY_TRACT | Status: DC
  Administered 2015-06-13 – 2015-06-19 (×21): 0.5 mg via RESPIRATORY_TRACT
  Filled 2015-06-13 (×21): qty 2.5

## 2015-06-13 MED ORDER — FENTANYL CITRATE (PF) 100 MCG/2ML IJ SOLN
INTRAMUSCULAR | Status: AC
  Administered 2015-06-13: 100 ug
  Filled 2015-06-13: qty 2

## 2015-06-13 MED ORDER — ROCURONIUM BROMIDE 50 MG/5ML IV SOLN
50.0000 mg/kg | Freq: Once | INTRAVENOUS | Status: AC
  Administered 2015-06-13: 50 mg via INTRAVENOUS

## 2015-06-13 MED ORDER — FAT EMULSION 20 % IV EMUL
240.0000 mL | INTRAVENOUS | Status: AC
  Administered 2015-06-13: 240 mL via INTRAVENOUS
  Filled 2015-06-13: qty 250

## 2015-06-13 MED ORDER — SODIUM CHLORIDE 0.9% FLUSH
10.0000 mL | INTRAVENOUS | Status: DC | PRN
  Administered 2015-06-29 – 2015-07-04 (×2): 10 mL
  Filled 2015-06-13 (×3): qty 40

## 2015-06-13 MED ORDER — SODIUM CHLORIDE 0.9 % IV SOLN
1500.0000 mg | Freq: Once | INTRAVENOUS | Status: AC
  Administered 2015-06-13: 1500 mg via INTRAVENOUS
  Filled 2015-06-13: qty 1500

## 2015-06-13 MED ORDER — TRACE MINERALS CR-CU-MN-SE-ZN 10-1000-500-60 MCG/ML IV SOLN
INTRAVENOUS | Status: AC
  Administered 2015-06-13: 18:00:00 via INTRAVENOUS
  Filled 2015-06-13: qty 960

## 2015-06-13 NOTE — Progress Notes (Signed)
eLink Physician-Brief Progress Note Patient Name: [name] [name] DOB: [birth year] MRN: [id number]   Date of Service  06/13/2015  HPI/Events of Note  ABG 7.41/43/62/91%. On CVVHD w/ negative balance and borderline SBP/MAP.  eICU Interventions  Continue current diuresis. Close monitoring for intubation potential.      Intervention Category Intermediate Interventions: Diagnostic test evaluation  Tera Partridge 06/13/2015, 4:08 AM

## 2015-06-13 NOTE — Care Management Note (Addendum)
Case Management Note  Patient Details  Name: [name] [name] MRN: [id number] Date of Birth: 1950/05/12  Subjective/Objective:    He underwent an elective surgical repair including an aorto to left renal bypass procedure on 06/09/2015 and developed VDRF -  Transferred to ICU                Action/Plan:   Pt re intubated 06/13/15  Pt is from home with wife and daughter serves as support person.  CM will continue to monitor for disposition needs   Expected Discharge Date:                  Expected Discharge Plan:  [place]  In-House Referral:     Discharge planning Services  CM Consult  Post Acute Care Choice:    Choice offered to:     DME Arranged:    DME Agency:     HH Arranged:    HH Agency:     Status of Service:  In process, will continue to follow  Medicare Important Message Given:  Yes Date Medicare IM Given:    Medicare IM give by:    Date Additional Medicare IM Given:    Additional Medicare Important Message give by:     If discussed at [place] of Stay Meetings, dates discussed:    Additional Comments:  Maryclare Labrador, RN 06/13/2015, 2:52 PM

## 2015-06-13 NOTE — Progress Notes (Signed)
  Echocardiogram 2D Echocardiogram has been performed.  [name] [name] 06/13/2015, 2:27 PM

## 2015-06-13 NOTE — Clinical Documentation Improvement (Signed)
Critical Care  Please specify the type of Encephalopathy and document response in next progress note NOT in BPA drop down box.   Metabolic Encephalopathy  Hypoxic/Hypercarbic Encephalopathy  Other Type  Clinically Undetermined  Please exercise your independent, professional judgment when responding. A specific answer is not anticipated or expected.  Thank You, Zoila Shutter RN, BSN, [place] [phone]; Cell: [phone]

## 2015-06-13 NOTE — Procedures (Signed)
Central Venous Catheter Insertion Procedure Note DEVRAJ BOAZ [id number] 04-Oct-1950  Procedure: Insertion of Central Venous Catheter Indications: Assessment of intravascular volume, Drug and/or fluid administration and Frequent blood sampling  Procedure Details Consent: Risks of procedure as well as the alternatives and risks of each were explained to the (patient/caregiver).  Consent for procedure obtained. Time Out: Verified patient identification, verified procedure, site/side was marked, verified correct patient position, special equipment/implants available, medications/allergies/relevent history reviewed, required imaging and test results available.  Performed  Maximum sterile technique was used including antiseptics, cap, gloves, gown, hand hygiene, mask and sheet. Skin prep: Chlorhexidine; local anesthetic administered A antimicrobial bonded/coated triple lumen catheter was placed in the right internal jugular vein using the Seldinger technique. Ultrasound guidance used.Yes.   Catheter placed to 16 cm. Blood aspirated via all 3 ports and then flushed x 3. Line sutured x 2 and dressing applied.  Evaluation Blood flow good Complications: No apparent complications Patient did tolerate procedure well. Chest X-ray ordered to verify placement.  CXR: pending.  Richardson Landry Minor ACNP [facility] PCCM Pager [phone] till 3 pm If no answer page [phone] 06/13/2015, 10:32 AM

## 2015-06-13 NOTE — Progress Notes (Addendum)
[facility]  Subjective  - Reintubated this morning for progressive respiratory failure, still anuric, CVVH   Objective 137/61 104 99.4 F (37.4 C) (Axillary) 16 94%  Intake/Output Summary (Last 24 hours) at 06/13/15 1205 Last data filed at 06/13/15 1200  Gross per 24 hour  Intake 1194.1 ml  Output   4295 ml  Net -3100.9 ml   Abdomen soft still with bilious NG output, no BM Feet pink warm Neuro sedated but opens eyes and moves extremities  Assessment/Planning: S/p AAA repair Oliguric renal failure with no return of function currently CVVH per renal Afib rate controlled per Cardiology VDRF per CCM hopefully try to get off ventilator in a few days after some fluid off Ileus/severe protein calorie malnutrition start TPN today, switch to tube feeds when evidence of gut function Thrombocytopenia most likely consumptive but HIT still pending Blood loss anemia transfuse for Hgb less than 8  ID no leukocytosis currently Temp curve 99-100 follow for now  Ruta Hinds 06/13/2015 12:05 PM --  Laboratory Lab Results:  Recent Labs  06/12/15 0425 06/13/15 0424  WBC 11.9* 10.3  HGB 9.4* 9.1*  HCT 28.1* 28.2*  PLT 39* 42*   BMET  Recent Labs  06/12/15 1530 06/13/15 0424  NA 140 140  K 5.5* 5.6*  CL 103 104  CO2 24 26  GLUCOSE 88 83  BUN 50* 43*  CREATININE 5.95* 5.05*  CALCIUM 8.4* 8.1*    COAG Lab Results  Component Value Date   INR 1.39 06/10/2015   INR 1.40 06/09/2015   INR 1.07 06/09/2015   No results found for: PTT

## 2015-06-13 NOTE — Progress Notes (Signed)
UR Completed. Miley Blanchett, RN, BSN.  [phone] 

## 2015-06-13 NOTE — Progress Notes (Signed)
ANTIBIOTIC CONSULT NOTE - INITIAL  Pharmacy Consult for Vanco/Zosyn Indication: HCAP  No Known Allergies  Patient Measurements: Height: 6' (182.9 cm) Weight: 203 lb 7.8 oz (92.3 kg) IBW/kg (Calculated) : 77.6 Adjusted Body Weight:    Vital Signs: Temp: 99.4 F (37.4 C) (02/10 0722) Temp Source: Axillary (02/10 0722) BP: 140/67 mmHg (02/10 0900) Pulse Rate: 108 (02/10 1000) Intake/Output from previous day: 02/09 0701 - 02/10 0700 In: 1010.8 [I.V.:880.8; NG/GT:30; IV Piggyback:100] Out: [id number] [Urine:20; Emesis/NG output:1250; Stool:1] Intake/Output from this shift: Total I/O In: 110.1 [I.V.:110.1] Out: 463 [Other:463]  Labs:  Recent Labs  06/11/15 0700 06/12/15 0425 06/12/15 1530 06/13/15 0424  WBC 20.7* 11.9*  --  10.3  HGB 10.4* 9.4*  --  9.1*  PLT 47* 39*  --  42*  CREATININE  --  6.27* 5.95* 5.05*   Estimated Creatinine Clearance: 16.6 mL/min (by C-G formula based on Cr of 5.05). No results for input(s): VANCOTROUGH, VANCOPEAK, VANCORANDOM, GENTTROUGH, GENTPEAK, GENTRANDOM, TOBRATROUGH, TOBRAPEAK, TOBRARND, AMIKACINPEAK, AMIKACINTROU, AMIKACIN in the last 72 hours.   Microbiology: Recent Results (from the past 720 hour(s))  Surgical pcr screen     Status: None   Collection Time: 06/05/15  3:46 PM  Result Value Ref Range Status   MRSA, PCR NEGATIVE NEGATIVE Final   Staphylococcus aureus NEGATIVE NEGATIVE Final    Comment:        The Xpert SA Assay (FDA approved for NASAL specimens in patients over 67 years of age), is one component of a comprehensive surveillance program.  Test performance has been validated by [facility] for patients greater than or equal to 10 year old. It is not intended to diagnose infection nor to guide or monitor treatment.     Medical History: Past Medical History  Diagnosis Date  . Arteriosclerotic cardiovascular disease (ASCVD)     AMI in 2000 treated at [facility]; cath in 12/2006->  Chronic total obstruction of the RCA;   drug-eluting stent placed in M1; inferior hypokinesis with an EF of 45%  . Tobacco abuse, in remission     20 pack years; quit in 2009  . Hypertension   . Chronic anticoagulation   . Atrial fibrillation ([place])     on coumadin for couple of years, stopped plavix at that time  . PVD (peripheral vascular disease) ([place])     Ct angiogram in 2009 revealed stable disease with 80% celiac stenosis,50% right renal artery ,ASCVD with ulceration in the abdominal aortashe  . Nephrolithiasis   . Cerebrovascular disease 2002    carotid stent  . Hyperlipidemia   . Myocardial infarction ([place]) 10 yrs ago  . Testicular carcinoma ([place]) 1990    right orchiectomy  . AAA (abdominal aortic aneurysm) ([place]) 2017    3.3cm  . Dysrhythmia   . Shortness of breath dyspnea     'sometimes'  . GERD (gastroesophageal reflux disease)     Assessment: 65 yo M  s/p elective repair of abdominal aortic aneurysm, left renal artery stenosis on 06/09/15.  Procedure complicated by 4L blood loss.  Now with post-op resp failure, renal failure, anemia.  Anticoagulation: Afib on Coumadin PTA with h/o CVA. CHADSVASC 4.  Last dose 2/1 in anticipation of surgery.  Enoxaparin for VTE ppx 30mg /d. Plts 88 > 47 > 39>42. Per Dr. [place] Bells too early for HIT.  Dr. Oneida Alar thinks it is from recent surgery but checking HIT anyway.  OK to cont Enox.  Will defer full dose anticoagulation until OK with vascular surgery  Infectious  Disease:WBC 10.3. Tmax 99.4. Start Vanco/Zosyn for HCAP. No CXR. No cultures currently. Dose for CVVHD. Vanco 2/10>> Zosyn 2/10>>  Nephrology: RAS with occluded R renal artery. Scr elevated 5.05 but down. Currently on CVVHD. -3.88L  Pulmonary: h/o tobacco. Post-op VDRF on Precedex >> extubated 2/7, but reintubated 2/10. Needs volume removal with HD to assist with pulm status.  Best practices: LMWH, IV PPI  Goal of Therapy:  Vancomycin trough level 15-20 mcg/ml  Plan:  Zosyn 3.375g IV q6hr. Vancomycin 1500mg  IV x 1  then 1g IV q24h. Trough after 3-5 doses at steady state. Nutrition?   Kendal Ghazarian S. Alford Highland, PharmD, [facility] Clinical Staff Pharmacist Pager [phone]  [place], [place] 06/13/2015,10:06 AM

## 2015-06-13 NOTE — Progress Notes (Signed)
PARENTERAL NUTRITION CONSULT NOTE - INITIAL  Pharmacy Consult:  TPN Indication:  Prolonged ileus  No Known Allergies  Patient Measurements: Height: 6' (182.9 cm) Weight: 203 lb 7.8 oz (92.3 kg) IBW/kg (Calculated) : 77.6  Vital Signs: Temp: 99.4 F (37.4 C) (02/10 0722) Temp Source: Axillary (02/10 0722) BP: 168/66 mmHg (02/10 1206) Pulse Rate: 105 (02/10 1206) Intake/Output from previous day: 02/09 0701 - 02/10 0700 In: 1010.8 [I.V.:880.8; NG/GT:30; IV Piggyback:100] Out: [id number] [Urine:20; Emesis/NG output:1250; Stool:1] Intake/Output from this shift: Total I/O In: 466.8 [I.V.:166.8; IV Piggyback:300] Out: 736 [Other:736]  Labs:  Recent Labs  06/11/15 0700 06/12/15 0425 06/13/15 0424  WBC 20.7* 11.9* 10.3  HGB 10.4* 9.4* 9.1*  HCT 32.6* 28.1* 28.2*  PLT 47* 39* 42*     Recent Labs  06/11/15 0305 06/12/15 0425 06/12/15 1530 06/13/15 0424  NA 143 139 140 140  K 5.1 4.8 5.5* 5.6*  CL 105 99* 103 104  CO2 22 25 24 26   GLUCOSE 134* 95 88 83  BUN 48* 50* 50* 43*  CREATININE 5.98* 6.27* 5.95* 5.05*  CALCIUM 7.7* 8.3* 8.4* 8.1*  MG 1.6*  --   --  2.8*  PHOS 6.7* 6.8* 6.6* 5.5*  ALBUMIN 1.9* 1.7* 1.9* 1.9*   Estimated Creatinine Clearance: 16.6 mL/min (by C-G formula based on Cr of 5.05).    Recent Labs  06/13/15 0717  GLUCAP 88    Medical History: Past Medical History  Diagnosis Date  . Arteriosclerotic cardiovascular disease (ASCVD)     AMI in 2000 treated at [facility]; cath in 12/2006->  Chronic total obstruction of the RCA;  drug-eluting stent placed in M1; inferior hypokinesis with an EF of 45%  . Tobacco abuse, in remission     20 pack years; quit in 2009  . Hypertension   . Chronic anticoagulation   . Atrial fibrillation ([place])     on coumadin for couple of years, stopped plavix at that time  . PVD (peripheral vascular disease) ([place])     Ct angiogram in 2009 revealed stable disease with 80% celiac stenosis,50% right renal artery ,ASCVD with  ulceration in the abdominal aortashe  . Nephrolithiasis   . Cerebrovascular disease 2002    carotid stent  . Hyperlipidemia   . Myocardial infarction ([place]) 10 yrs ago  . Testicular carcinoma ([place]) 1990    right orchiectomy  . AAA (abdominal aortic aneurysm) ([place]) 2017    3.3cm  . Dysrhythmia   . Shortness of breath dyspnea     'sometimes'  . GERD (gastroesophageal reflux disease)       Insulin Requirements in the past 24 hours:  Not on SSI  Assessment: 43 YOM with history of occluded right renal artery discovered on 05/28/15 presented on 06/09/15 for AAA repair, left renal artery bypass and aortic endarterectomy.  Post-op course was complicated by respiratory failure requiring intubation and AKI requiring dialysis, currently on CRRT.  Pharmacy consulted to initiate TPN for prolonged ileus.   GI: GERD - NG O/P 1627mL - PPI IV Endo: TSH WNL.  No hx DM - AM glucose controlled Lytes: K, Phos, and Mag are all elevated (Ca x Phos = 53.79, goal < 55) Renal: AKI s/p HD x1 on 2/8, started CRRT 2/9 - SCr down 5.05 (BL SCr 1.2) - NS at 10 ml/hr Pulm: hx tobacco use, intubated 2/10, FiO2 down 60% - Xopenex/Atrovent nebs Cards: ASCVD / HTN / Afib / PVD - BP soft, tachy (in Afib) - off Lasix, amiodarone gtt Hepatobil:  amylase elevated at 225, AST/ALT elevated, tbili WNL AC/Heme: Coumadin for Afib stopped 2/1 PTA - concern with HIT, hgb 9.1, plts 42 Neuro: hx CVA.  Fentanyl gtt - GCS 14, CPOT 0, RASS -1 ID: Vanc/Zosyn D#1 (2/10 >> ) for HCAP, afebrile, WBC WNL, PCT 35, LA 1.3 Best Practices: Lovenox, MC TPN Access: CVC triple lumen placed 06/13/15 TPN start date: 2/10 >>  Current Nutrition:  None  Nutritional Goals:  2300-2400 kCal, 135-145 grams of protein per day   Plan:  - Initiate Clinimix 5/15 (no lytes) at 40 ml/hr + IVFE at 10 ml/hr - Daily multivitamin and trace elements - Start sensitive SSI, d/c if CBGs remain controlled at goal TPN rate - F/U HIT panel, currently on Lovenox  as MD thinks risk for HIT is low - F/U AM labs   Denisse Whitenack D. Mina Marble, PharmD, BCPS Pager:  [phone] 06/13/2015, 12:52 PM

## 2015-06-13 NOTE — Progress Notes (Signed)
OT Cancellation Note  Patient Details Name: [name] [name] MRN: [id number] DOB: 1950/05/15   Cancelled Treatment:    Reason Eval/Treat Not Completed: Patient not medically ready (Signing off. Please reorder as appropriate.)  [name] 06/13/2015, 8:09 AM

## 2015-06-13 NOTE — Progress Notes (Signed)
PULMONARY / CRITICAL CARE MEDICINE   Name: [name] [name] MRN: [id number] DOB: 1950/09/20    ADMISSION DATE:  06/09/2015 CONSULTATION DATE:  06/09/2015  REFERRING MD:  Vascular Surgery, Dr. Oneida Alar  CHIEF COMPLAINT:  Post op VDRF  SUBJECTIVE:  Increased O2 needs overnight.  Gurgling in throat.  Feels short of breath and getting tired.  More pain in abdomen.  VITAL SIGNS: BP 140/67 mmHg  Pulse 110  Temp(Src) 99.4 F (37.4 C) (Axillary)  Resp 23  Ht 6' (1.829 m)  Wt 203 lb 7.8 oz (92.3 kg)  BMI 27.59 kg/m2  SpO2 94%  HEMODYNAMICS:    VENTILATOR SETTINGS:    INTAKE / OUTPUT: I/O last 3 completed shifts: In: 1613.2 [I.V.:1321.2; NG/GT:60; IV Piggyback:232] Out: [id number] [Urine:20; Emesis/NG output:1600; JQ:[id number]; Stool:1]  PHYSICAL EXAMINATION: General: ill appearing, using accessory muscles, weak voice HEENT: NG tube in place Cardiac: irregular, tachycardic Chest: b/l wheeze and crackls Abd: tender, decreased BS Ext: 1+ edema Neuro: follows commands, moves extremities   LABS:  BMET  Recent Labs Lab 06/12/15 0425 06/12/15 1530 06/13/15 0424  NA 139 140 140  K 4.8 5.5* 5.6*  CL 99* 103 104  CO2 25 24 26   BUN 50* 50* 43*  CREATININE 6.27* 5.95* 5.05*  GLUCOSE 95 88 83    Electrolytes  Recent Labs Lab 06/10/15 0440  06/11/15 0305 06/12/15 0425 06/12/15 1530 06/13/15 0424  CALCIUM 7.8*  --  7.7* 8.3* 8.4* 8.1*  MG 1.3*  --  1.6*  --   --  2.8*  PHOS  --   < > 6.7* 6.8* 6.6* 5.5*  < > = values in this interval not displayed.  CBC  Recent Labs Lab 06/11/15 0700 06/12/15 0425 06/13/15 0424  WBC 20.7* 11.9* 10.3  HGB 10.4* 9.4* 9.1*  HCT 32.6* 28.1* 28.2*  PLT 47* 39* 42*    Coag's  Recent Labs Lab 06/09/15 0604 06/09/15 1655 06/10/15 0440  APTT 33 36  --   INR 1.07 1.40 1.39    Sepsis Markers No results for input(s): LATICACIDVEN, PROCALCITON, O2SATVEN in the last 168 hours.  ABG  Recent Labs Lab 06/10/15 0502 06/12/15 0940  06/13/15 0402  PHART 7.315* 7.366 7.407  PCO2ART 30.2* 42.6 43.1  PO2ART 67.0* 61.0* 62.0*    Liver Enzymes  Recent Labs Lab 06/09/15 1655 06/10/15 0440  06/12/15 0425 06/12/15 1530 06/13/15 0424  AST 67* 172*  --   --   --   --   ALT 27 71*  --   --   --   --   ALKPHOS 42 39  --   --   --   --   BILITOT 1.4* 1.0  --   --   --   --   ALBUMIN 2.8* 2.2*  < > 1.7* 1.9* 1.9*  < > = values in this interval not displayed.  Cardiac Enzymes  Recent Labs Lab 06/12/15 0942  TROPONINI 0.04*    Glucose  Recent Labs Lab 06/09/15 1656 06/13/15 0717  GLUCAP 106* 74    Imaging Dg Chest Port 1 View  06/12/2015  CLINICAL DATA:  Shortness of breath and hypertension EXAM: PORTABLE CHEST 1 VIEW COMPARISON:  June 11, 2015 FINDINGS: Swan-Ganz catheter has been removed. Cordis tip is in the superior vena cava. Left central catheter tip is in the superior vena cava. Nasogastric tube tip and side port are below the diaphragm. No pneumothorax. There are bilateral pleural effusions with bibasilar atelectasis. There is mild underlying interstitial  edema. There is cardiomegaly. The pulmonary vascularity appears within normal limits. IMPRESSION: Tube and catheter positions as described without pneumothorax. Evidence of a degree of congestive heart failure. Electronically Signed   By: Lowella Grip III M.D.   On: 06/12/2015 08:00     STUDIES:  1/21 CT abd >> Rt renal atrophy, 4 mm Lt renal calculus, increased size Lt para-aortic lesion, 3.3 cm AAA infrarenal 1/23 CT angio abd >> concern for enlarging pseudoaneurysm, stenosis of Rt renal artery and Lt renal artery 1/25 Abd aortogram >> occluded Rt renal artery  CULTURES: 2/10 Sputum >>  ANTIBIOTICS: 2/10 Vancomycin >> 2/10 Zosyn >>  SIGNIFICANT EVENTS: 2/6 to OR for elective abdominal aortic aneurysm repair, out on vent 2/7 extubated, HD cath placed 2/8 HD > afib with rvr, 1 L removed 2/10 VDRF  LINES/TUBES: 2/06 RIJ PA cath >>>  2/8 2/07 L IJ HC cath  >>>  2/10 ETT >> 2/10 Rt IJ CVL >>  DISCUSSION: 65 yo male with hx of AAA presented with abdominal pain from extension of aneurysm and Rt renal artery occlusion.  Post op course complicated by VDRF, AKI, and A fib.  Developed recurrent respiratory failure with respiratory secretions 2/10 and required re-intubation.  ASSESSMENT / PLAN:  PULMONARY A: Acute hypoxic respiratory failure from pulmonary edema, pleural effusions and concern for HCAP. Wheezing noted 2/10 with hx of smoking. P:   Intubated 2/10 F/u CXR, ABG Scheduled BDs Negative fluid balance as tolerated  CARDIOVASCULAR A:  Abdominal aortic aneurysm s/p open repair 2/6. A fib with RVR with hx of permanent A fib >> rapid heart 2/10 might be related to respiratory distress >> improved after intubation. Acute on chronic combined CHF. Hx of CAD, HTN. P:  Amiodarone per cardiology  RENAL A:   AKI due to ATN. Hyperkalemia. P:   CRRT per renal  GASTROINTESTINAL A:   Nutrition. Hx of GERD. Possible ileus. P:   NPO >> might need TNA Protonix for SUP F/u portable Abd Xray 2/10  HEMATOLOGIC A:   Anemia of critical illness. Thrombocytopenia. P:  F/u CBC F/u HIT panel from 2/09 Continue lovenox for now for DVT prophylaxis  INFECTIOUS A:   Increased respiratory secretions 2/10 with concern for developing HCAP. P:   Day 1 vancomycin, zosyn F/u procalcitonin, lactic acid, sputum culture  ENDOCRINE A:   No acute issues. P:   Follow glucose on BMP  NEUROLOGIC A:   Sedation. P:   RASS goal -1  Attempted to contact family by phone >> no answer at listed numbers.  CC time 44 minutes.  Chesley Mires, MD [facility] Pulmonary/Critical Care 06/13/2015, 10:06 AM Pager:  [phone] After 3pm call: [phone]

## 2015-06-13 NOTE — Procedures (Signed)
Intubation Procedure Note [name] [name] [id number] 11-09-1950  Procedure: Intubation Indications: Respiratory insufficiency  Procedure Details Consent: Unable to obtain consent because of emergent medical necessity. Time Out: Verified patient identification, verified procedure, site/side was marked, verified correct patient position, special equipment/implants available, medications/allergies/relevent history reviewed, required imaging and test results available.  Performed  MAC and 3 Medications:  Fentanyl 100 mcg Etomidate 20 mg Versed 4 mg NMB    Evaluation Hemodynamic Status: BP stable throughout; O2 sats: stable throughout Patient's Current Condition: stable Complications: No apparent complications Patient did tolerate procedure well. Chest X-ray ordered to verify placement.  CXR: pending.   [name] [name] ACNP [facility] PCCM Pager [phone] till 3 pm If no answer page [phone] 06/13/2015, 9:58 AM

## 2015-06-13 NOTE — Plan of Care (Signed)
Pressure held x 30 minutes to right IJ site after line placed Pressure dressing dressing applied. Will continue to assess.

## 2015-06-13 NOTE — Progress Notes (Signed)
Nutrition Follow-up  DOCUMENTATION CODES:   Not applicable  INTERVENTION:    TPN per pharmacy  NUTRITION DIAGNOSIS:   Inadequate oral intake related to inability to eat as evidenced by NPO status, ongoing  GOAL:   Patient will meet greater than or equal to 90% of their needs, progressing  MONITOR:   Vent status, Labs, Weight trends, Skin, I & O's, TPN prescription   ASSESSMENT:   65 yo Male with PMH as below, which is significant HTN, Atrial Fib on warfarin, CVA, and known AAA. He was recently admitted to [facility] ED 1/20 with complaints of abdominal pain. CT abdomen at that time showed evidence of hypoperfusion to the R kidney. CT angio showed some enlargement of the seat of an aneurysm. 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 It was on that day that he presented, and underwent the procedures. Estimated 4L blood loss during case. Post operatively he remains on vent in surgical ICU.   Patient s/p procedure 2/6: REPAIR OF JUXTARENAL ABDOMINAL AORTIC ANEURYSM, LEFT RENAL BYPASS  Patient is currently intubated on ventilator support -- NGT to LIS MV: 11.0 L/min Temp (24hrs), Avg:98.5 F (36.9 C), Min:97.5 F (36.4 C), Max:99.4 F (37.4 C)   Patient still anuric; continues on CVVHD.  RD consulted for new TPN due to prolonged ileus.  Patient is receiving TPN via CVC with Clinimix E 5/15 @ 40 ml/hr and lipids @ 10 ml/hr.  Provides 1162 kcal and 48 grams protein per day. Meets 56% minimum estimated energy needs and 38% minimum estimated protein needs.  Diet  Order:  Diet NPO time specified TPN (CLINIMIX) Adult without lytes  Skin:  Reviewed, no issues  Last BM:  2/9  Height:   Ht Readings from Last 1 Encounters:  06/09/15 6' (1.829 m)    Weight:   Wt Readings from Last 1 Encounters:  06/13/15 203 lb 7.8 oz (92.3 kg)    Ideal Body Weight:  81 kg  BMI:  Body mass index is 27.59 kg/(m^2).  Estimated Nutritional Needs:   Kcal:  2061  Protein:  135-145 gm  Fluid:  per MD  EDUCATION NEEDS:   No education needs identified at this time  Arthur Holms, RD, LDN Pager #: [phone] After-Hours Pager #: [phone]

## 2015-06-13 NOTE — Progress Notes (Signed)
    Subjective:  Denies CP or dyspnea; mildlly confused   Objective:  Filed Vitals:   06/13/15 0630 06/13/15 0700 06/13/15 0715 06/13/15 0722  BP:  104/57    Pulse: 106 103 109   Temp:    99.4 F (37.4 C)  TempSrc:    Axillary  Resp: 15 13 14    Height:      Weight:      SpO2: 92% 93% 94%     Intake/Output from previous day:  Intake/Output Summary (Last 24 hours) at 06/13/15 0732 Last data filed at 06/13/15 0700  Gross per 24 hour  Intake 1010.8 ml  Output   4544 ml  Net -3533.2 ml    Physical Exam: Physical exam: Well-developed well-nourished in mild respiratory distress; diffuse anasarca Skin is warm and dry.  HEENT is normal.  Neck is supple.  Chest with rhonchi and diminished BS bases Cardiovascular exam is irregular Abdominal exam s/p abd surgery; tender to palpation Extremities show 1-2+ edema. neuro grossly intact; mildly confused    Lab Results: Basic Metabolic Panel:  Recent Labs  06/11/15 0305  06/12/15 1530 06/13/15 0424  NA 143  < > 140 140  K 5.1  < > 5.5* 5.6*  CL 105  < > 103 104  CO2 22  < > 24 26  GLUCOSE 134*  < > 88 83  BUN 48*  < > 50* 43*  CREATININE 5.98*  < > 5.95* 5.05*  CALCIUM 7.7*  < > 8.4* 8.1*  MG 1.6*  --   --  2.8*  PHOS 6.7*  < > 6.6* 5.5*  < > = values in this interval not displayed. CBC:  Recent Labs  06/12/15 0425 06/13/15 0424  WBC 11.9* 10.3  HGB 9.4* 9.1*  HCT 28.1* 28.2*  MCV 86.7 86.8  PLT 39* 42*   Cardiac Enzymes:  Recent Labs  06/12/15 0942  TROPONINI 0.04*     Assessment/Plan:  1 permanent atrial fibrillation-patient has long-standing atrial fibrillation. HR improved (high normal); continue amiodarone and metoprolol. He received one dose of digoxin. TSH normal; echo pending. 2 acute on chronic combined systolic/diastolic congestive heart failure-the patient is volume overloaded on examination. Await echocardiogram. The majority of his volume is likely secondary to acute renal failure. He  is anuric and dialysis is ongoing. 3 acute renal failure-nephrology following and dialysis initiated. 4 acute respiratory failure-pulmonary following. High risk for reintubation. 5 status post repair of abdominal aortic aneurysm/renal bypass-management per vascular surgery. 6 history of hypertension-all blood pressure medications are on hold other than metoprolol. We will adjust as he improves. 7 history of coronary artery disease-Resume statin at discharge. 8 acute blood loss anemia-follow hemoglobin. 9 thrombocytopenia-Agree with HITT panel.  Kirk Ruths 06/13/2015, 7:32 AM

## 2015-06-13 NOTE — Progress Notes (Signed)
eLink Physician-Brief Progress Note Patient Name: [name] [name] DOB: 1950/08/06 MRN: [id number]   Date of Service  06/13/2015  HPI/Events of Note  Increasing Oxygen requirement. Camera Check shows patient on dialysis & NRB. Eyes open.  eICU Interventions  Stat ABG.     Intervention Category Major Interventions: Hypoxemia - evaluation and management  Tera Partridge 06/13/2015, 4:01 AM

## 2015-06-13 NOTE — Progress Notes (Signed)
[facility] Progress Note    Assessment/ Plan:   1. 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  Prior to 1/21, the patient's renal function was normal (Cr 1.21 on 04/09/15). Only 20cc of UOP.  Transitioned to CVVHD on 2/9 and seems to be doing better with this, however continues to be intermittently tachycardic. Continue CVVHD for now with a lower dialysate potassium.    2. Anion gap metabolic acidosis: most likely secondary to ARF, resolved with HD.  3. Hypomagnesemia: Repleted previously, now 2.8.    4. Hyperkalemia: Up to 5.6. Will correct with CVVHD.    5. Hypertension: BPs have been stable ton CCRT, 100s/40s, did have 1 of 68/35.   5. Atrial fibrillation: patient in Afib with RVR, still on an amio gtt, received 1 time dose of digoxin. Cardiology following. HRs 100s currently.   Subjective:   Patient breathing is stable, denies any SOB however is on NRB. Denies any pain initially. Only answering yes/no questions.    Objective:   BP 123/78 mmHg  Pulse 106  Temp(Src) 98.9 F (37.2 C) (Oral)  Resp 15  Ht 6' (1.829 m)  Wt 203 lb 7.8 oz (92.3 kg)  BMI 27.59 kg/m2  SpO2 92%  Intake/Output Summary (Last 24 hours) at 06/13/15 0645 Last data filed at 06/13/15 0600  Gross per 24 hour  Intake 1010.8 ml  Output   4894 ml  Net -3883.2 ml   Weight change: -20 lb 1 oz (-9.1 kg)  Physical Exam: General: Lying in bed on NRB satting 93%. Bear hugger on.  Left IJ catheter in place and c/d/i.  Cardiovascular: Tachycardic in the 100s, irregular rhythm. No murmurs, rubs, or gallops noted.  Respiratory: No increased WOB. On NRB.Transmitted upper airway sounds. Abdomen: No bowel sounds heard, soft, tenderness near incision site, clean, dry, closed with  staples.  MSK: Trace pitting  edema noted.   Imaging: Dg Chest Port 1 View  06/12/2015  CLINICAL DATA:  Shortness of breath and hypertension EXAM: PORTABLE CHEST 1 VIEW COMPARISON:  June 11, 2015 FINDINGS: Swan-Ganz catheter has been removed. Cordis tip is in the superior vena cava. Left central catheter tip is in the superior vena cava. Nasogastric tube tip and side port are below the diaphragm. No pneumothorax. There are bilateral pleural effusions with bibasilar atelectasis. There is mild underlying interstitial edema. There is cardiomegaly. The pulmonary vascularity appears within normal limits. IMPRESSION: Tube and catheter positions as described without pneumothorax. Evidence of a degree of congestive heart failure. Electronically Signed   By: Lowella Grip III M.D.   On: 06/12/2015 08:00    Labs: BMET  Recent Labs Lab 06/09/15 1655 06/10/15 0440 06/10/15 1323 06/11/15 0305 06/12/15 0425 06/12/15 1530 06/13/15 0424  NA 137 141 141 143 139 140 140  K 5.3* 5.7* 5.2* 5.1 4.8 5.5* 5.6*  CL 102 108 107 105 99* 103 104  CO2 24 17*  --  22 25 24 26   GLUCOSE 124* 158* 152* 134* 95 88 83  BUN 18 27* 39* 48* 50* 50* 43*  CREATININE 2.34* 3.85* 4.50* 5.98* 6.27* 5.95* 5.05*  CALCIUM 8.4* 7.8*  --  7.7* 8.3* 8.4* 8.1*  PHOS  --   --   --  6.7* 6.8* 6.6* 5.5*   CBC  Recent Labs Lab 06/10/15  0440 06/10/15 1323 06/11/15 0700 06/12/15 0425 06/13/15 0424  WBC 19.4*  --  20.7* 11.9* 10.3  HGB 12.8* 12.2* 10.4* 9.4* 9.1*  HCT 39.5 36.0* 32.6* 28.1* 28.2*  MCV 86.4  --  86.9 86.7 86.8  PLT 88*  --  47* 39* 42*    Medications:    . antiseptic oral rinse  7 mL Mouth Rinse q12n4p  . chlorhexidine  15 mL Mouth Rinse BID  . docusate sodium  100 mg Oral Daily  . enoxaparin (LOVENOX) injection  30 mg Subcutaneous Q24H  . metoprolol  5 mg Intravenous 4 times per day  . sodium chloride flush  10-40 mL Intracatheter Q12H      Kathrine Cords, MD [facility] Resident,  PGY-2 06/13/2015, 6:45 AM  I have seen and examined this patient and agree with plan per Dr Lorenso Courier.  Pulled net 150cc/hr yest and now 100cc/hr.  CXR shows more fluid today.    K sl high, will change fluids.  CCM plans to intubate. Claudie Rathbone T,MD 06/13/2015 9:21 AM

## 2015-06-14 ENCOUNTER — Inpatient Hospital Stay (HOSPITAL_COMMUNITY): Payer: Medicare Other

## 2015-06-14 LAB — POCT I-STAT 3, ART BLOOD GAS (G3+)
Acid-Base Excess: 1 mmol/L (ref 0.0–2.0)
Bicarbonate: 25.3 mEq/L — ABNORMAL HIGH (ref 20.0–24.0)
O2 Saturation: 96 %
Patient temperature: 98.3
TCO2: 26 mmol/L (ref 0–100)
pCO2 arterial: 38.5 mmHg (ref 35.0–45.0)
pH, Arterial: 7.426 (ref 7.350–7.450)
pO2, Arterial: 80 mmHg (ref 80.0–100.0)

## 2015-06-14 LAB — GLUCOSE, CAPILLARY
Glucose-Capillary: 130 mg/dL — ABNORMAL HIGH (ref 65–99)
Glucose-Capillary: 138 mg/dL — ABNORMAL HIGH (ref 65–99)
Glucose-Capillary: 137 mg/dL — ABNORMAL HIGH (ref 65–99)
Glucose-Capillary: 124 mg/dL — ABNORMAL HIGH (ref 65–99)

## 2015-06-14 LAB — CBC
HCT: 27.8 % — ABNORMAL LOW (ref 39.0–52.0)
Hemoglobin: 9.1 g/dL — ABNORMAL LOW (ref 13.0–17.0)
MCH: 28.6 pg (ref 26.0–34.0)
MCHC: 32.7 g/dL (ref 30.0–36.0)
MCV: 87.4 fL (ref 78.0–100.0)
Platelets: 49 10*3/uL — ABNORMAL LOW (ref 150–400)
RBC: 3.18 MIL/uL — ABNORMAL LOW (ref 4.22–5.81)
RDW: 18.1 % — ABNORMAL HIGH (ref 11.5–15.5)
WBC: 9.5 10*3/uL (ref 4.0–10.5)

## 2015-06-14 LAB — COMPREHENSIVE METABOLIC PANEL
ALT: 73 U/L — ABNORMAL HIGH (ref 17–63)
AST: 52 U/L — ABNORMAL HIGH (ref 15–41)
Albumin: 1.8 g/dL — ABNORMAL LOW (ref 3.5–5.0)
Alkaline Phosphatase: 74 U/L (ref 38–126)
Anion gap: 12 (ref 5–15)
BUN: 36 mg/dL — ABNORMAL HIGH (ref 6–20)
CO2: 25 mmol/L (ref 22–32)
Calcium: 8.4 mg/dL — ABNORMAL LOW (ref 8.9–10.3)
Chloride: 101 mmol/L (ref 101–111)
Creatinine, Ser: 3.38 mg/dL — ABNORMAL HIGH (ref 0.61–1.24)
GFR calc Af Amer: 21 mL/min — ABNORMAL LOW (ref >60)
GFR calc non Af Amer: 18 mL/min — ABNORMAL LOW (ref >60)
Glucose, Bld: 150 mg/dL — ABNORMAL HIGH (ref 65–99)
Potassium: 4.6 mmol/L (ref 3.5–5.1)
Sodium: 138 mmol/L (ref 135–145)
Total Bilirubin: 0.8 mg/dL (ref 0.3–1.2)
Total Protein: 5.6 g/dL — ABNORMAL LOW (ref 6.5–8.1)

## 2015-06-14 LAB — BASIC METABOLIC PANEL
Anion gap: 10 (ref 5–15)
BUN: 33 mg/dL — ABNORMAL HIGH (ref 6–20)
CO2: 26 mmol/L (ref 22–32)
Calcium: 8.4 mg/dL — ABNORMAL LOW (ref 8.9–10.3)
Chloride: 100 mmol/L — ABNORMAL LOW (ref 101–111)
Creatinine, Ser: 3.31 mg/dL — ABNORMAL HIGH (ref 0.61–1.24)
GFR calc Af Amer: 21 mL/min — ABNORMAL LOW (ref >60)
GFR calc non Af Amer: 19 mL/min — ABNORMAL LOW (ref >60)
Glucose, Bld: 151 mg/dL — ABNORMAL HIGH (ref 65–99)
Potassium: 4.3 mmol/L (ref 3.5–5.1)
Sodium: 136 mmol/L (ref 135–145)

## 2015-06-14 LAB — RENAL FUNCTION PANEL
Albumin: 1.8 g/dL — ABNORMAL LOW (ref 3.5–5.0)
Anion gap: 14 (ref 5–15)
BUN: 34 mg/dL — ABNORMAL HIGH (ref 6–20)
CO2: 25 mmol/L (ref 22–32)
Calcium: 8.5 mg/dL — ABNORMAL LOW (ref 8.9–10.3)
Chloride: 97 mmol/L — ABNORMAL LOW (ref 101–111)
Creatinine, Ser: 3.3 mg/dL — ABNORMAL HIGH (ref 0.61–1.24)
GFR calc Af Amer: 22 mL/min — ABNORMAL LOW (ref >60)
GFR calc non Af Amer: 19 mL/min — ABNORMAL LOW (ref >60)
Glucose, Bld: 139 mg/dL — ABNORMAL HIGH (ref 65–99)
Phosphorus: 3.6 mg/dL (ref 2.5–4.6)
Potassium: 4.3 mmol/L (ref 3.5–5.1)
Sodium: 136 mmol/L (ref 135–145)

## 2015-06-14 LAB — DIFFERENTIAL
Basophils Absolute: 0 10*3/uL (ref 0.0–0.1)
Basophils Relative: 0 %
Eosinophils Absolute: 0.2 10*3/uL (ref 0.0–0.7)
Eosinophils Relative: 2 %
Lymphocytes Relative: 9 %
Lymphs Abs: 0.8 10*3/uL (ref 0.7–4.0)
Monocytes Absolute: 0.9 10*3/uL (ref 0.1–1.0)
Monocytes Relative: 9 %
Neutro Abs: 7.6 10*3/uL (ref 1.7–7.7)
Neutrophils Relative %: 80 %

## 2015-06-14 LAB — TRIGLYCERIDES: Triglycerides: 449 mg/dL — ABNORMAL HIGH (ref <150)

## 2015-06-14 LAB — MAGNESIUM
Magnesium: 2.8 mg/dL — ABNORMAL HIGH (ref 1.7–2.4)
Magnesium: 2.9 mg/dL — ABNORMAL HIGH (ref 1.7–2.4)

## 2015-06-14 LAB — PREALBUMIN: Prealbumin: 11.1 mg/dL — ABNORMAL LOW (ref 18–38)

## 2015-06-14 LAB — PROCALCITONIN: Procalcitonin: 26.73 ng/mL

## 2015-06-14 LAB — HEPARIN INDUCED PLATELET AB (HIT ANTIBODY): Heparin Induced Plt Ab: 0.21 OD (ref 0.000–0.400)

## 2015-06-14 LAB — PHOSPHORUS: Phosphorus: 4.2 mg/dL (ref 2.5–4.6)

## 2015-06-14 MED ORDER — PHENYLEPHRINE HCL 10 MG/ML IJ SOLN
0.0000 ug/min | INTRAMUSCULAR | Status: DC
  Administered 2015-06-14: 5 ug/min via INTRAVENOUS
  Filled 2015-06-14: qty 4

## 2015-06-14 MED ORDER — TRACE MINERALS CR-CU-MN-SE-ZN 10-1000-500-60 MCG/ML IV SOLN
INTRAVENOUS | Status: AC
  Administered 2015-06-14: 18:00:00 via INTRAVENOUS
  Filled 2015-06-14: qty 1992

## 2015-06-14 MED ORDER — ACETYLCYSTEINE 20 % IN SOLN
4.0000 mL | Freq: Two times a day (BID) | RESPIRATORY_TRACT | Status: DC
  Administered 2015-06-14 – 2015-06-18 (×9): 4 mL via RESPIRATORY_TRACT
  Filled 2015-06-14 (×9): qty 4

## 2015-06-14 NOTE — Progress Notes (Signed)
    Subjective:  Intubated; shakes head no to chest pain   Objective:  Filed Vitals:   06/14/15 1120 06/14/15 1200 06/14/15 1203 06/14/15 1300  BP:  145/71 141/62 108/62  Pulse:  101 98 93  Temp: 98.7 F (37.1 C)     TempSrc: Axillary     Resp:  21 18 21   Height:      Weight:      SpO2:  100% 100% 100%    Intake/Output from previous day:  Intake/Output Summary (Last 24 hours) at 06/14/15 1318 Last data filed at 06/14/15 1300  Gross per 24 hour  Intake 2401.95 ml  Output   4731 ml  Net -2329.05 ml    Physical Exam: Physical exam: Well-developed well-nourished intubated; diffuse anasarca Skin is warm and dry.  HEENT is normal.  Neck is supple.  Chest with rhonchi and diminished BS bases Cardiovascular exam is irregular Abdominal exam s/p abd surgery; tender to palpation Extremities show 1+ edema.    Lab Results: Basic Metabolic Panel:  Recent Labs  06/13/15 0424 06/13/15 1500 06/14/15 0435  NA 140 140 138  K 5.6* 5.5* 4.6  CL 104 102 101  CO2 26 23 25   GLUCOSE 83 94 150*  BUN 43* 37* 36*  CREATININE 5.05* 4.24* 3.38*  CALCIUM 8.1* 8.4* 8.4*  MG 2.8*  --  2.8*  PHOS 5.5* 4.9* 4.2   CBC:  Recent Labs  06/13/15 0424 06/14/15 0435  WBC 10.3 9.5  NEUTROABS  --  7.6  HGB 9.1* 9.1*  HCT 28.2* 27.8*  MCV 86.8 87.4  PLT 42* 49*   Cardiac Enzymes:  Recent Labs  06/12/15 0942  TROPONINI 0.04*     Assessment/Plan:  1 permanent atrial fibrillation-patient has long-standing atrial fibrillation. HR improved (high normal); continue amiodarone. He received one dose of digoxin. TSH normal; echo difficult but suggests EF 35-40, unchanged compared to previous. 2 acute on chronic combined systolic/diastolic congestive heart failure-the patient is volume overloaded on examination. The majority of his volume is likely secondary to acute renal failure. Dialysis is ongoing. 3 acute renal failure-nephrology following and dialysis initiated. 4 acute  respiratory failure-Now reintubated; per CCM 5 status post repair of abdominal aortic aneurysm/renal bypass-management per vascular surgery. 6 history of hypertension-all blood pressure medications are on hold. We will adjust as he improves. 7 history of coronary artery disease-Resume statin at discharge. 8 acute blood loss anemia-follow hemoglobin. 9 thrombocytopenia-Await HITT panel. 10 NSVT-check electrolytes; resume beta blocker when BP stable  Kirk Ruths 06/14/2015, 1:18 PM

## 2015-06-14 NOTE — Progress Notes (Signed)
[facility] Progress Note    Assessment/ Plan:   1. AKI due to ATN: most likely in the setting of poor renal perfusion: a chronically occluded R kidney with a pseudoaneurysm on the left and significant hypovolemia/hypotension intra-operatively and post-operatively with an estimated blood loss of 4L leading to ATN.  Prior to 1/21, the patient's renal function was normal (Cr 1.21 on 04/09/15). Anuric. Transitioned to CVVHD on 2/9, now on vasopressors for hypotension. Was pulling net 100cc off, now net 0 due to hypotension since 4am. CXR appears slightly improved. Continue with CVVHD with net 0 balance and with pre and post fluid of 0/2.5, however continue to monitor as calcium corrects to 9.8  2. Anion gap metabolic acidosis: most likely secondary to ARF,  resolved with HD.  3. Hypomagnesemia: Repleted previously, now 2.8.    4. Hyperkalemia: resolved with CVVHD.    5. Hypotension: Currently on neo for pressure support.   5. Atrial fibrillation: patient in Afib with RVR, still on an amio gtt, received 1 time dose of digoxin. Cardiology following. HRs 90s-100s currently.   Subjective:   Patient re-intubated 2/10 due to respiratory failure. Hypotensive (80/60s) overnight requiring neosynephrine.     Objective:   BP 130/66 mmHg  Pulse 107  Temp(Src) 99 F (37.2 C) (Axillary)  Resp 17  Ht 6' (1.829 m)  Wt 192 lb 7.4 oz (87.3 kg)  BMI 26.10 kg/m2  SpO2 100%  Intake/Output Summary (Last 24 hours) at 06/14/15 0834 Last data filed at 06/14/15 0800  Gross per 24 hour  Intake   2098 ml  Output   4679 ml  Net  -2581 ml   Weight change: -11 lb 0.4 oz (-5 kg)  Physical Exam: General: Lying in bed intermittently opening eyes. Intubated.  Left IJ catheter in place and c/d/i.  Cardiovascular:,  irregular rhythm. No murmurs, rubs, or gallops noted.  Respiratory: No increased WOB. Anterior chest wall clear to ascultation. Intubated.  Abdomen: No bowel sounds heard, soft,  incision site clean, dry, closed with staples.  MSK: Trace pitting  edema noted in the pre-sacral region.    Imaging: Dg Chest Port 1 View  06/14/2015  CLINICAL DATA:  Hypoxia EXAM: PORTABLE CHEST 1 VIEW COMPARISON:  June 13, 2015 FINDINGS: Endotracheal tube tip is 4.4 cm above the carina. Left and right jugular catheter tips are both in the superior vena cava. Nasogastric tube tip and side port is below the diaphragm. No pneumothorax. There is no edema or consolidation. The heart size and pulmonary vascularity are normal. No adenopathy. Skin staples overlie the lower chest and visualized upper abdominal regions. IMPRESSION: Tube and catheter positions as described without pneumothorax. No edema or consolidation. Electronically Signed   By: Lowella Grip III M.D.   On: 06/14/2015 07:46   Dg Chest Port 1 View  06/13/2015  CLINICAL DATA:  Right central venous line placement, intubation EXAM: PORTABLE CHEST 1 VIEW COMPARISON:  06/12/15 FINDINGS: Endotracheal tube tip 4.3 cm above the carina. Right IJ central line tip over the superior vena cava. No change left central line. No pneumothorax. Mild enlargement of cardiac silhouette similar to prior study. Mild patchy airspace disease left upper lobe of uncertain significance. This is new from the prior study. Decreased left pleural effusion. Decreased right pleural effusion. Persistent but improved airspace opacity in the right central lung. IMPRESSION: No pneumothorax. Endotracheal tube as described. Bilateral effusions and infiltrates as described above Electronically Signed   By: Skipper Cliche M.D.   On:  06/13/2015 11:26   Dg Abd Portable 1v  06/13/2015  CLINICAL DATA:  Question ileus. Status post aortic aneurysm repair 06/09/2015. EXAM: PORTABLE ABDOMEN - 1 VIEW COMPARISON:  Single view of the abdomen 06/09/2015. FINDINGS: Surgical staples in the midline are again seen. The bowel gas pattern is unremarkable. No focal bony abnormality is identified.  IMPRESSION: Negative for bowel obstruction or ileus. Electronically Signed   By: Inge Rise M.D.   On: 06/13/2015 11:20    Labs: BMET  Recent Labs Lab 06/10/15 0440 06/10/15 1323 06/11/15 0305 06/12/15 0425 06/12/15 1530 06/13/15 0424 06/13/15 1500 06/14/15 0435  NA 141 141 143 139 140 140 140 138  K 5.7* 5.2* 5.1 4.8 5.5* 5.6* 5.5* 4.6  CL 108 107 105 99* 103 104 102 101  CO2 17*  --  22 25 24 26 23 25   GLUCOSE 158* 152* 134* 95 88 83 94 150*  BUN 27* 39* 48* 50* 50* 43* 37* 36*  CREATININE 3.85* 4.50* 5.98* 6.27* 5.95* 5.05* 4.24* 3.38*  CALCIUM 7.8*  --  7.7* 8.3* 8.4* 8.1* 8.4* 8.4*  PHOS  --   --  6.7* 6.8* 6.6* 5.5* 4.9* 4.2   CBC  Recent Labs Lab 06/11/15 0700 06/12/15 0425 06/13/15 0424 06/14/15 0435  WBC 20.7* 11.9* 10.3 9.5  NEUTROABS  --   --   --  7.6  HGB 10.4* 9.4* 9.1* 9.1*  HCT 32.6* 28.1* 28.2* 27.8*  MCV 86.9 86.7 86.8 87.4  PLT 47* 39* 42* 49*    Medications:    . antiseptic oral rinse  7 mL Mouth Rinse 10 times per day  . chlorhexidine gluconate  15 mL Mouth Rinse BID  . enoxaparin (LOVENOX) injection  30 mg Subcutaneous Q24H  . insulin aspart  0-9 Units Subcutaneous 4 times per day  . ipratropium  0.5 mg Nebulization Q6H  . levalbuterol  0.63 mg Nebulization Q6H  . pantoprazole (PROTONIX) IV  40 mg Intravenous Q24H  . piperacillin-tazobactam  3.375 g Intravenous 4 times per day  . sodium chloride flush  10-40 mL Intracatheter Q12H  . vancomycin  1,000 mg Intravenous Q24H      Kathrine Cords, MD [place] Resident, PGY-2 06/14/2015, 8:34 AM  I have seen and examined this patient and agree with plan per Dr Lorenso Courier.  Some hypotension so keeping even for now and on pressors.  Metabolically stable.   Cont with 0K replacement fluid. Shakiyla Kook T,MD 06/14/2015 9:02 AM

## 2015-06-14 NOTE — Progress Notes (Addendum)
PULMONARY / CRITICAL CARE MEDICINE   Name: [name] [name] MRN: [id number] DOB: 1951/04/03    ADMISSION DATE:  06/09/2015 CONSULTATION DATE:  06/09/2015  REFERRING MD:  Vascular Surgery, Dr. Oneida Alar  CHIEF COMPLAINT:  Post op VDRF  SUBJECTIVE:  Pressors started for sbp<90, remains in a fib with cvp with amio drip  VITAL SIGNS: BP 130/66 mmHg  Pulse 107  Temp(Src) 99 F (37.2 C) (Axillary)  Resp 17  Ht 6' (1.829 m)  Wt 192 lb 7.4 oz (87.3 kg)  BMI 26.10 kg/m2  SpO2 92%  HEMODYNAMICS:    VENTILATOR SETTINGS: Vent Mode:  [-] PRVC FiO2 (%):  [40 %-100 %] 40 % Set Rate:  [18 bmp] 18 bmp Vt Set:  [620 mL] 620 mL PEEP:  [10 cmH20] 10 cmH20 Plateau Pressure:  [20 cmH20-26 cmH20] 22 cmH20  INTAKE / OUTPUT: I/O last 3 completed shifts: In: 2499.6 [I.V.:1289.6; NG/GT:60; IV Piggyback:450] Out: [id number] [Emesis/NG output:1250; Other:5891]  PHYSICAL EXAMINATION: General: Sedated and intubated  HEENT: NG/ET tube in place, PERL 4 mm Cardiac: irregular, tachycardic 104 , on neo drip Chest: decreased bs bilaterally Abd: tender, decreased BS Ext: 1+ edema Neuro:sedated, follows commands intermittently    LABS:  BMET  Recent Labs Lab 06/13/15 0424 06/13/15 1500 06/14/15 0435  NA 140 140 138  K 5.6* 5.5* 4.6  CL 104 102 101  CO2 26 23 25   BUN 43* 37* 36*  CREATININE 5.05* 4.24* 3.38*  GLUCOSE 83 94 150*    Electrolytes  Recent Labs Lab 06/11/15 0305  06/13/15 0424 06/13/15 1500 06/14/15 0435  CALCIUM 7.7*  < > 8.1* 8.4* 8.4*  MG 1.6*  --  2.8*  --  2.8*  PHOS 6.7*  < > 5.5* 4.9* 4.2  < > = values in this interval not displayed.  CBC  Recent Labs Lab 06/12/15 0425 06/13/15 0424 06/14/15 0435  WBC 11.9* 10.3 9.5  HGB 9.4* 9.1* 9.1*  HCT 28.1* 28.2* 27.8*  PLT 39* 42* 49*    Coag's  Recent Labs Lab 06/09/15 0604 06/09/15 1655 06/10/15 0440  APTT 33 36  --   INR 1.07 1.40 1.39    Sepsis Markers  Recent Labs Lab 06/13/15 1020 06/14/15 0435   LATICACIDVEN 1.3  --   PROCALCITON 35.13 26.73    ABG  Recent Labs Lab 06/13/15 0402 06/13/15 1109 06/14/15 0427  PHART 7.407 7.334* 7.426  PCO2ART 43.1 48.4* 38.5  PO2ART 62.0* 181.0* 80.0    Liver Enzymes  Recent Labs Lab 06/09/15 1655 06/10/15 0440  06/13/15 0424 06/13/15 1500 06/14/15 0435  AST 67* 172*  --   --   --  52*  ALT 27 71*  --   --   --  73*  ALKPHOS 42 39  --   --   --  74  BILITOT 1.4* 1.0  --   --   --  0.8  ALBUMIN 2.8* 2.2*  < > 1.9* 1.9* 1.8*  < > = values in this interval not displayed.  Cardiac Enzymes  Recent Labs Lab 06/12/15 0942  TROPONINI 0.04*    Glucose  Recent Labs Lab 06/09/15 1656 06/13/15 0717 06/13/15 1725 06/13/15 2352 06/14/15 0550  GLUCAP 106* 74 88 130* 138*    Imaging Dg Chest Port 1 View  06/14/2015  CLINICAL DATA:  Hypoxia EXAM: PORTABLE CHEST 1 VIEW COMPARISON:  June 13, 2015 FINDINGS: Endotracheal tube tip is 4.4 cm above the carina. Left and right jugular catheter tips are both in the  superior vena cava. Nasogastric tube tip and side port is below the diaphragm. No pneumothorax. There is no edema or consolidation. The heart size and pulmonary vascularity are normal. No adenopathy. Skin staples overlie the lower chest and visualized upper abdominal regions. IMPRESSION: Tube and catheter positions as described without pneumothorax. No edema or consolidation. Electronically Signed   By: Lowella Grip III M.D.   On: 06/14/2015 07:46   Dg Chest Port 1 View  06/13/2015  CLINICAL DATA:  Right central venous line placement, intubation EXAM: PORTABLE CHEST 1 VIEW COMPARISON:  06/12/15 FINDINGS: Endotracheal tube tip 4.3 cm above the carina. Right IJ central line tip over the superior vena cava. No change left central line. No pneumothorax. Mild enlargement of cardiac silhouette similar to prior study. Mild patchy airspace disease left upper lobe of uncertain significance. This is new from the prior study. Decreased  left pleural effusion. Decreased right pleural effusion. Persistent but improved airspace opacity in the right central lung. IMPRESSION: No pneumothorax. Endotracheal tube as described. Bilateral effusions and infiltrates as described above Electronically Signed   By: Skipper Cliche M.D.   On: 06/13/2015 11:26   Dg Chest Port 1 View  06/12/2015  CLINICAL DATA:  Shortness of breath and hypertension EXAM: PORTABLE CHEST 1 VIEW COMPARISON:  June 11, 2015 FINDINGS: Swan-Ganz catheter has been removed. Cordis tip is in the superior vena cava. Left central catheter tip is in the superior vena cava. Nasogastric tube tip and side port are below the diaphragm. No pneumothorax. There are bilateral pleural effusions with bibasilar atelectasis. There is mild underlying interstitial edema. There is cardiomegaly. The pulmonary vascularity appears within normal limits. IMPRESSION: Tube and catheter positions as described without pneumothorax. Evidence of a degree of congestive heart failure. Electronically Signed   By: Lowella Grip III M.D.   On: 06/12/2015 08:00   Dg Abd Portable 1v  06/13/2015  CLINICAL DATA:  Question ileus. Status post aortic aneurysm repair 06/09/2015. EXAM: PORTABLE ABDOMEN - 1 VIEW COMPARISON:  Single view of the abdomen 06/09/2015. FINDINGS: Surgical staples in the midline are again seen. The bowel gas pattern is unremarkable. No focal bony abnormality is identified. IMPRESSION: Negative for bowel obstruction or ileus. Electronically Signed   By: Inge Rise M.D.   On: 06/13/2015 11:20     STUDIES:  1/21 CT abd >> Rt renal atrophy, 4 mm Lt renal calculus, increased size Lt para-aortic lesion, 3.3 cm AAA infrarenal 1/23 CT angio abd >> concern for enlarging pseudoaneurysm, stenosis of Rt renal artery and Lt renal artery 1/25 Abd aortogram >> occluded Rt renal artery  CULTURES: 2/10 Sputum >>  ANTIBIOTICS: 2/10 Vancomycin >> 2/10 Zosyn >>  SIGNIFICANT EVENTS: 2/6 to OR for  elective abdominal aortic aneurysm repair, out on vent 2/7 extubated, HD cath placed 2/8 HD > afib with rvr, 1 L removed 2/10 VDRF  LINES/TUBES: 2/06 RIJ PA cath >>> 2/8 2/07 L IJ HC cath  >>>  2/10 ETT >> 2/10 Rt IJ CVL >>  DISCUSSION: 65 yo male with hx of AAA presented with abdominal pain from extension of aneurysm and Rt renal artery occlusion.  Post op course complicated by VDRF, AKI, and A fib.  Developed recurrent respiratory failure with respiratory secretions 2/10 and required re-intubation.  ASSESSMENT / PLAN:  PULMONARY A: Acute hypoxic respiratory failure from pulmonary edema, pleural effusions and concern for HCAP. Wheezing noted 2/10 with hx of smoking. Age and debility will be impedance to extubation P:   Intubated 2/10 F/u  CXR, ABG Scheduled BDs Negative fluid balance as tolerated, 2/11 on pressor  CARDIOVASCULAR A:  Abdominal aortic aneurysm s/p open repair 2/6. A fib with RVR with hx of permanent A fib >> rapid heart 2/10 might be related to respiratory distress >> improved after intubation. Acute on chronic combined CHF. Hx of CAD, HTN. Hypotension P:  Amiodarone per cardiology Neo drip as per protocol Check cvp Q shift  RENAL A:   AKI due to ATN. Hyperkalemia. P:   CRRT per renal  GASTROINTESTINAL A:   Nutrition. Hx of GERD. Possible ileus. P:   NPO >> might need TNA Protonix for SUP F/u portable Abd Xray 2/10  HEMATOLOGIC CBC Latest Ref Rng 06/14/2015 06/13/2015 06/12/2015  WBC 4.0 - 10.5 K/uL 9.5 10.3 11.9(H)  Hemoglobin 13.0 - 17.0 g/dL 9.1(L) 9.1(L) 9.4(L)  Hematocrit 39.0 - 52.0 % 27.8(L) 28.2(L) 28.1(L)  Platelets 150 - 400 K/uL 49(L) 42(L) 39(L)    A:   Anemia of critical illness. Thrombocytopenia.  P:  F/u CBC F/u HIT panel from 2/09>> Continue lovenox for now for DVT prophylaxis  INFECTIOUS A:   Increased respiratory secretions 2/10 with concern for developing HCAP. P:   Day 2 vancomycin, zosyn procalcitonin 26.73   lactic acid, 1.3  sputum culture>>  ENDOCRINE A:   No acute issues. P:   Follow glucose on BMP  NEUROLOGIC A:   Sedation. P:   RASS goal -1  Attempted to contact family by phone >> no answer at listed numbers. 2/10 per Dr. Halford Chessman.  2/11 no family at bedside.  Richardson Landry Minor ACNP [facility] PCCM Pager [phone] till 3 pm If no answer page [phone] 06/14/2015, 7:56 AM     STAFF NOTE: I, Merrie Roof, MD FACP have personally reviewed patient's available data, including medical history, events of note, physical examination and test results as part of my evaluation. I have discussed with resident/NP and other care providers such as pharmacist, RN and RRT. In addition, I personally evaluated patient and elicited key findings of: HCAP likely, rt base infiltrate improved aeration on pcxr today, neo required but now improved off, required cvp , did have neg over 2 liters last 24hrs, MAP goal 60 to consider, continued vanc, zosyn for now, follow micro sputum, would have chest pt, mucomyst to facilitate secretions from rt base, goal to peep 8 but would likely leave on 8 until am to improve secretions and aeration , if in am worsening atx, would bronch The patient is critically ill with multiple organ systems failure and requires high complexity decision making for assessment and support, frequent evaluation and titration of therapies, application of advanced monitoring technologies and extensive interpretation of multiple databases.   Critical Care Time devoted to patient care services described in this note is 30 Minutes. This time reflects time of care of this signee: Merrie Roof, MD FACP. This critical care time does not reflect procedure time, or teaching time or supervisory time of PA/NP/Med student/Med Resident etc but could involve care discussion time. Rest per NP/medical resident whose note is outlined above and that I agree with   Lavon Paganini. Titus Mould, MD, [place] Pgr:  [facility] 06/14/2015 4:41 PM

## 2015-06-14 NOTE — Progress Notes (Signed)
Mrs. Arvin updated on patient's drop in blood pressure, and initiation of Neo for BP support overnight. No questions at this time. Thanked for update. Will continue to closely monitor. Richarda Blade RN

## 2015-06-14 NOTE — Progress Notes (Signed)
PARENTERAL NUTRITION CONSULT NOTE  Pharmacy Consult:  TPN Indication:  Prolonged ileus  No Known Allergies  Patient Measurements: Height: 6' (182.9 cm) Weight: 192 lb 7.4 oz (87.3 kg) IBW/kg (Calculated) : 77.6  Vital Signs: Temp: 99 F (37.2 C) (02/11 0714) Temp Source: Axillary (02/11 0714) BP: 143/68 mmHg (02/11 1000) Pulse Rate: 109 (02/11 0855) Intake/Output from previous day: 02/10 0701 - 02/11 0700 In: 2029.2 [I.V.:849.2; NG/GT:30; IV Piggyback:450; TPN:700] Out: 4718 [Emesis/NG output:550] Intake/Output from this shift: Total I/O In: 341.5 [I.V.:161.5; NG/GT:30; TPN:150] Out: 409 [Emesis/NG output:150; Other:259]  Labs:  Recent Labs  06/12/15 0425 06/13/15 0424 06/14/15 0435  WBC 11.9* 10.3 9.5  HGB 9.4* 9.1* 9.1*  HCT 28.1* 28.2* 27.8*  PLT 39* 42* 49*     Recent Labs  06/13/15 0424 06/13/15 1500 06/14/15 0435  NA 140 140 138  K 5.6* 5.5* 4.6  CL 104 102 101  CO2 26 23 25   GLUCOSE 83 94 150*  BUN 43* 37* 36*  CREATININE 5.05* 4.24* 3.38*  CALCIUM 8.1* 8.4* 8.4*  MG 2.8*  --  2.8*  PHOS 5.5* 4.9* 4.2  PROT  --   --  5.6*  ALBUMIN 1.9* 1.9* 1.8*  AST  --   --  52*  ALT  --   --  73*  ALKPHOS  --   --  74  BILITOT  --   --  0.8  PREALBUMIN  --   --  11.1*  TRIG  --   --  449*   Estimated Creatinine Clearance: 24.9 mL/min (by C-G formula based on Cr of 3.38).    Recent Labs  06/13/15 1725 06/13/15 2352 06/14/15 0550  GLUCAP 88 130* 138*    Insulin Requirements in the past 24 hours:  2 units SSI  Assessment: 21 YOM with history of occluded right renal artery discovered on 05/28/15 presented on 06/09/15 for AAA repair, left renal artery bypass and aortic endarterectomy.  Post-op course was complicated by respiratory failure requiring intubation and AKI requiring dialysis, currently on CRRT.  Pharmacy consulted to initiate TPN for prolonged ileus on 2/10.  GI: GERD - NG O/P 550 mlL - PPI IV Prealb 11.1 on 2/11, reflects critical  illness  Endo: TSH WNL.  No hx DM -CBGs 130 and 138 on TPN at 40 ml/hr Lytes: No lytes in TPN, K 4.6,  Phos 4.2, mag 2.8; Ca Cor = 10.16,  Ca x Phos = 43, goal < 55.  Renal: AKI s/p HD x1 on 2/8, started CRRT 2/9 - SCr down 3.38  (BL SCr 1.2) - NS at 10 ml/hr Pulm: hx tobacco use, intubated 2/10, Xopenex/Atrovent nebs Cards: ASCVD / HTN / Afib / PVD - neo drip added for BP support 2/10 PM Hepatobil: amylase elevated at 225, AST/ALT elevated, tbili WNL; PMH HLD trig 449, were 262 11/07/14 AC/Heme: Coumadin for Afib stopped 2/1 PTA - concern with HIT, hgb 9.1, plts 42 Neuro: hx CVA.  Fentanyl gtt - GCS 14, CPOT 0, RASS -1 ID: Vanc/Zosyn D#2 (2/10 >> ) for HCAP, afebrile, WBC WNL, PCT 35, LA 1.3 Best Practices: Lovenox, MC TPN Access: CVC triple lumen placed 06/13/15 TPN start date: 2/10 >>  Current Nutrition:  None  Nutritional Goals: per RD 2/10 1061 kCal, 135-145 grams of protein per day  Plan:  - increase  Clinimix 5/15 (no lytes) to 83 ml/hr, this will provide  ~100 gm protein and 1414 kcals - Stop IVFE due to Trig 449, recheck trig on Monday -  Daily multivitamin and trace elements - continue sensitive SSI, d/c if CBGs remain controlled at goal TPN rate  [name] [name], Pharm.D. BP:[id number] 06/14/2015 10:58 AM

## 2015-06-14 NOTE — Progress Notes (Signed)
   VASCULAR SURGERY ASSESSMENT & PLAN:  * 5 Days Post-Op s/p: repair of juxtarenal abdominal aortic aneurysm with left renal artery bypass  *  Requiring mediocre blood pressure support.  * Renal: Continue CVVH.  * GI/nutrition: Receiving TNA.  SUBJECTIVE: follows commands.  PHYSICAL EXAM: Filed Vitals:   06/14/15 0630 06/14/15 0645 06/14/15 0700 06/14/15 0714  BP: 138/77 93/58 130/66   Pulse: 111 98 107   Temp:    99 F (37.2 C)  TempSrc:    Axillary  Resp: 26 18 17    Height:      Weight: 192 lb 7.4 oz (87.3 kg)     SpO2: 100% 99% 92%    Lungs with good air exchange bilaterally. Abdomen: Hypoactive bowel sounds. Both feet are warm and well-perfused.  LABS: Lab Results  Component Value Date   WBC 9.5 06/14/2015   HGB 9.1* 06/14/2015   HCT 27.8* 06/14/2015   MCV 87.4 06/14/2015   PLT 49* 06/14/2015   Lab Results  Component Value Date   CREATININE 3.38* 06/14/2015   Lab Results  Component Value Date   INR 1.39 06/10/2015   CBG (last 3)   Recent Labs  06/13/15 1725 06/13/15 2352 06/14/15 0550  GLUCAP 88 130* 138*    Active Problems:   Atrial fibrillation (HCC)   Pseudoaneurysm of aorta (HCC)   Acute respiratory failure with hypoxia (HCC)   AKI (acute kidney injury) ([place])   Acute respiratory failure with hypoxemia [facility])   Gae Gallop [id number] 06/14/2015

## 2015-06-14 NOTE — Progress Notes (Signed)
[facility] Progress Note Patient Name: DARKO YECK DOB: 1950-08-31 MRN: [id number]   Date of Service  06/14/2015  HPI/Events of Note  RN notified of hypotension. Patient consistently negative I/O. Has central access.  eICU Interventions  Start Neosynephrine for goal MAP >65 & SBP >90.     Intervention Category Major Interventions: Hypotension - evaluation and management  Tera Partridge 06/14/2015, 4:44 AM

## 2015-06-15 ENCOUNTER — Inpatient Hospital Stay (HOSPITAL_COMMUNITY): Payer: Medicare Other

## 2015-06-15 DIAGNOSIS — J9690 Respiratory failure, unspecified, unspecified whether with hypoxia or hypercapnia: Secondary | ICD-10-CM | POA: Diagnosis not present

## 2015-06-15 DIAGNOSIS — J9600 Acute respiratory failure, unspecified whether with hypoxia or hypercapnia: Secondary | ICD-10-CM

## 2015-06-15 DIAGNOSIS — K567 Ileus, unspecified: Secondary | ICD-10-CM | POA: Diagnosis not present

## 2015-06-15 DIAGNOSIS — R1030 Lower abdominal pain, unspecified: Secondary | ICD-10-CM

## 2015-06-15 LAB — CULTURE, RESPIRATORY W GRAM STAIN

## 2015-06-15 LAB — POCT I-STAT 3, ART BLOOD GAS (G3+)
Acid-Base Excess: 1 mmol/L (ref 0.0–2.0)
Acid-Base Excess: 1 mmol/L (ref 0.0–2.0)
Bicarbonate: 26 mEq/L — ABNORMAL HIGH (ref 20.0–24.0)
Bicarbonate: 24.8 mEq/L — ABNORMAL HIGH (ref 20.0–24.0)
O2 Saturation: 98 %
O2 Saturation: 98 %
Patient temperature: 99
Patient temperature: 98.9
TCO2: 27 mmol/L (ref 0–100)
TCO2: 26 mmol/L (ref 0–100)
pCO2 arterial: 41.9 mmHg (ref 35.0–45.0)
pCO2 arterial: 37 mmHg (ref 35.0–45.0)
pH, Arterial: 7.402 (ref 7.350–7.450)
pH, Arterial: 7.435 (ref 7.350–7.450)
pO2, Arterial: 109 mmHg — ABNORMAL HIGH (ref 80.0–100.0)
pO2, Arterial: 102 mmHg — ABNORMAL HIGH (ref 80.0–100.0)

## 2015-06-15 LAB — RENAL FUNCTION PANEL
Albumin: 1.8 g/dL — ABNORMAL LOW (ref 3.5–5.0)
Albumin: 1.8 g/dL — ABNORMAL LOW (ref 3.5–5.0)
Anion gap: 11 (ref 5–15)
Anion gap: 13 (ref 5–15)
BUN: 39 mg/dL — ABNORMAL HIGH (ref 6–20)
BUN: 43 mg/dL — ABNORMAL HIGH (ref 6–20)
CO2: 25 mmol/L (ref 22–32)
CO2: 23 mmol/L (ref 22–32)
Calcium: 8.5 mg/dL — ABNORMAL LOW (ref 8.9–10.3)
Calcium: 8.6 mg/dL — ABNORMAL LOW (ref 8.9–10.3)
Chloride: 101 mmol/L (ref 101–111)
Chloride: 99 mmol/L — ABNORMAL LOW (ref 101–111)
Creatinine, Ser: 3.36 mg/dL — ABNORMAL HIGH (ref 0.61–1.24)
Creatinine, Ser: 2.99 mg/dL — ABNORMAL HIGH (ref 0.61–1.24)
GFR calc Af Amer: 21 mL/min — ABNORMAL LOW (ref >60)
GFR calc Af Amer: 24 mL/min — ABNORMAL LOW (ref >60)
GFR calc non Af Amer: 18 mL/min — ABNORMAL LOW (ref >60)
GFR calc non Af Amer: 21 mL/min — ABNORMAL LOW (ref >60)
Glucose, Bld: 125 mg/dL — ABNORMAL HIGH (ref 65–99)
Glucose, Bld: 148 mg/dL — ABNORMAL HIGH (ref 65–99)
Phosphorus: 4.2 mg/dL (ref 2.5–4.6)
Phosphorus: 2.8 mg/dL (ref 2.5–4.6)
Potassium: 3.9 mmol/L (ref 3.5–5.1)
Potassium: 4.1 mmol/L (ref 3.5–5.1)
Sodium: 137 mmol/L (ref 135–145)
Sodium: 135 mmol/L (ref 135–145)

## 2015-06-15 LAB — CBC
HCT: 28.6 % — ABNORMAL LOW (ref 39.0–52.0)
Hemoglobin: 9.5 g/dL — ABNORMAL LOW (ref 13.0–17.0)
MCH: 28.5 pg (ref 26.0–34.0)
MCHC: 33.2 g/dL (ref 30.0–36.0)
MCV: 85.9 fL (ref 78.0–100.0)
Platelets: 53 10*3/uL — ABNORMAL LOW (ref 150–400)
RBC: 3.33 MIL/uL — ABNORMAL LOW (ref 4.22–5.81)
RDW: 18 % — ABNORMAL HIGH (ref 11.5–15.5)
WBC: 10.4 10*3/uL (ref 4.0–10.5)

## 2015-06-15 LAB — GLUCOSE, CAPILLARY
Glucose-Capillary: 116 mg/dL — ABNORMAL HIGH (ref 65–99)
Glucose-Capillary: 131 mg/dL — ABNORMAL HIGH (ref 65–99)
Glucose-Capillary: 126 mg/dL — ABNORMAL HIGH (ref 65–99)
Glucose-Capillary: 146 mg/dL — ABNORMAL HIGH (ref 65–99)

## 2015-06-15 LAB — MAGNESIUM: Magnesium: 2.6 mg/dL — ABNORMAL HIGH (ref 1.7–2.4)

## 2015-06-15 LAB — PROCALCITONIN: Procalcitonin: 22.99 ng/mL

## 2015-06-15 MED ORDER — PRISMASOL BGK 4/2.5 32-4-2.5 MEQ/L IV SOLN
INTRAVENOUS | Status: DC
  Administered 2015-06-15 – 2015-06-16 (×2): via INTRAVENOUS_CENTRAL
  Filled 2015-06-15 (×4): qty 5000

## 2015-06-15 MED ORDER — FENTANYL BOLUS VIA INFUSION
25.0000 ug | INTRAVENOUS | Status: DC | PRN
  Filled 2015-06-15: qty 25

## 2015-06-15 MED ORDER — FENTANYL CITRATE (PF) 100 MCG/2ML IJ SOLN
50.0000 ug | INTRAMUSCULAR | Status: DC | PRN
  Administered 2015-06-15 – 2015-06-25 (×44): 50 ug via INTRAVENOUS
  Filled 2015-06-15 (×41): qty 2

## 2015-06-15 MED ORDER — PRISMASOL BGK 4/2.5 32-4-2.5 MEQ/L IV SOLN
INTRAVENOUS | Status: DC
  Administered 2015-06-15 – 2015-06-17 (×3): via INTRAVENOUS_CENTRAL
  Filled 2015-06-15 (×5): qty 5000

## 2015-06-15 MED ORDER — TRACE MINERALS CR-CU-MN-SE-ZN 10-1000-500-60 MCG/ML IV SOLN
INTRAVENOUS | Status: AC
  Administered 2015-06-15: 17:00:00 via INTRAVENOUS
  Filled 2015-06-15: qty 2760

## 2015-06-15 MED ORDER — FENTANYL CITRATE (PF) 100 MCG/2ML IJ SOLN
INTRAMUSCULAR | Status: AC
  Filled 2015-06-15: qty 2

## 2015-06-15 NOTE — Progress Notes (Signed)
PARENTERAL NUTRITION CONSULT NOTE  Pharmacy Consult:  TPN Indication:  Prolonged ileus  No Known Allergies  Patient Measurements: Height: 6' (182.9 cm) Weight: 192 lb 7.4 oz (87.3 kg) IBW/kg (Calculated) : 77.6  Vital Signs: Temp: 98.3 F (36.8 C) (02/12 0717) Temp Source: Axillary (02/12 0717) BP: 174/66 mmHg (02/12 0816) Pulse Rate: 105 (02/12 0816) Intake/Output from previous day: 02/11 0701 - 02/12 0700 In: 3123.6 [I.V.:971.9; NG/GT:120; IV Piggyback:400; TPN:1631.8] Out: 4730 [Emesis/NG output:700] Intake/Output from this shift: Total I/O In: 119.7 [I.V.:36.7; TPN:83] Out: 216 [Other:216]  Labs:  Recent Labs  06/13/15 0424 06/14/15 0435 06/15/15 0344  WBC 10.3 9.5 10.4  HGB 9.1* 9.1* 9.5*  HCT 28.2* 27.8* 28.6*  PLT 42* 49* 53*     Recent Labs  06/14/15 0435 06/14/15 1340 06/14/15 1646 06/15/15 0344  NA 138 136 136 137  K 4.6 4.3 4.3 3.9  CL 101 100* 97* 101  CO2 25 26 25 25   GLUCOSE 150* 151* 139* 125*  BUN 36* 33* 34* 39*  CREATININE 3.38* 3.31* 3.30* 3.36*  CALCIUM 8.4* 8.4* 8.5* 8.5*  MG 2.8* 2.9*  --  2.6*  PHOS 4.2  --  3.6 4.2  PROT 5.6*  --   --   --   ALBUMIN 1.8*  --  1.8* 1.8*  AST 52*  --   --   --   ALT 73*  --   --   --   ALKPHOS 74  --   --   --   BILITOT 0.8  --   --   --   PREALBUMIN 11.1*  --   --   --   TRIG 449*  --   --   --    Estimated Creatinine Clearance: 25 mL/min (by C-G formula based on Cr of 3.36).    Recent Labs  06/14/15 1758 06/14/15 2313 06/15/15 0611  GLUCAP 124* 116* 131*    Insulin Requirements in the past 24 hours:  2 units SSI  Assessment: 66 YOM with history of occluded right renal artery discovered on 05/28/15 presented on 06/09/15 for AAA repair, left renal artery bypass and aortic endarterectomy.  Post-op course was complicated by respiratory failure requiring intubation and AKI requiring dialysis, currently on CRRT.  Pharmacy consulted to initiate TPN for prolonged ileus on 2/10.  GI: GERD -  NG O/P 700 mlL, no stool - PPI IV Prealb 11.1 on 2/11, reflects critical illness  Endo: TSH WNL.  No hx DM -2 U SSI, all < 150 Lytes: No lytes in TPN, K 3.9- renal to change CRRT replacement fluids to 4 K,  Phos 4.2, mag 2.56; Ca Cor = 10.26,  Ca x Phos = 42, goal < 55.  Renal: AKI s/p HD x1 on 2/8, started CRRT 2/9 - SCr down 3.36  (BL SCr 1.2) - NS at 10 ml/hr, CRRT pulling 100 cc/hr, no UOP, NS at [place]: hx tobacco use, intubated 2/10, Xopenex/Atrovent nebs Cards: ASCVD / HTN / Afib / PVD - neo drip added for BP support 2/10 PM, now off pressors, on amio gtt Hepatobil: amylase elevated at 225, AST/ALT slightly elevated, tbili WNL; PMH HLD, trig 449, were 262 11/07/14 AC/Heme: Coumadin for Afib stopped 2/1 PTA - concern with HIT, hgb 9.5, plts 53 Neuro: hx CVA.  Fentanyl gtt  ID: Vanc/Zosyn D#3 (2/10 >> ) for HCAP, afebrile, WBC WNL, PCT 23, LA 1.3 Best Practices: Lovenox, MC TPN Access: CVC triple lumen placed 06/13/15 TPN start date: 2/10 >>  Current Nutrition:  NPO TPN w/o lytes at 83 ml/hr, no lipids  Nutritional Goals: per RD 2/10 2061 kCal, 135-145 grams of protein per day  Plan:  - increase Clinimix 5/15 (no lytes) to 115 hr, this will provide  ~138 gm protein and 1960 kcals.   This provides 100% protein and ~ 95 % kcal goal. - may need to add electrolytes to TPN soon, will defer to renal  - stopped IVFE on Saturday evening due to Trig 449, recheck trig on Monday - Daily multivitamin and trace elements - continue sensitive SSI, d/c if CBGs remain controlled at goal TPN rate  Eudelia Bunch, Pharm.D. [id number] 06/15/2015 9:07 AM

## 2015-06-15 NOTE — Progress Notes (Signed)
   VASCULAR SURGERY ASSESSMENT & PLAN:  * 6 Days Post-Op s/p: repair of juxtarenal abdominal aortic aneurysm with left renal artery bypass  *Hemodynamically stable.   * Renal: Continue CVVH. No urine.   * GI/nutrition: Receiving TNA.  *  PULM: Spoke with Dr. Titus Mould, pt is making progress on vent.   SUBJECTIVE: Sedated on vent.  PHYSICAL EXAM: Filed Vitals:   06/15/15 0630 06/15/15 0645 06/15/15 0700 06/15/15 0717  BP: 111/61 110/55 129/60   Pulse:  93 104   Temp:    98.3 F (36.8 C)  TempSrc:    Axillary  Resp: 18 16 19    Height:      Weight:      SpO2:  100% 100%    Abdomen: hypoactive BS Lungs good air exchange bilat. Feet warm  LABS: Lab Results  Component Value Date   WBC 10.4 06/15/2015   HGB 9.5* 06/15/2015   HCT 28.6* 06/15/2015   MCV 85.9 06/15/2015   PLT 53* 06/15/2015   Lab Results  Component Value Date   CREATININE 3.36* 06/15/2015   Lab Results  Component Value Date   INR 1.39 06/10/2015   CBG (last 3)   Recent Labs  06/14/15 1118 06/14/15 1758 06/15/15 0611  GLUCAP 137* 124* 131*    Active Problems:   Atrial fibrillation (HCC)   Pseudoaneurysm of aorta (HCC)   Acute respiratory failure with hypoxia (HCC)   AKI (acute kidney injury) ([place])   Acute respiratory failure with hypoxemia [facility])    Gae Gallop [id number] 06/15/2015

## 2015-06-15 NOTE — Progress Notes (Signed)
PULMONARY / CRITICAL CARE MEDICINE   Name: [name] [name] MRN: [id number] DOB: 1951/02/06    ADMISSION DATE:  06/09/2015 CONSULTATION DATE:  06/09/2015  REFERRING MD:  Vascular Surgery, Dr. Oneida Alar  CHIEF COMPLAINT:  Post op VDRF  SUBJECTIVE:  Off pressors, negative i/o bp up  VITAL SIGNS: BP 129/60 mmHg  Pulse 104  Temp(Src) 98.3 F (36.8 C) (Axillary)  Resp 19  Ht 6' (1.829 m)  Wt 192 lb 7.4 oz (87.3 kg)  BMI 26.10 kg/m2  SpO2 100%  HEMODYNAMICS: CVP:  [4 mmHg-14 mmHg] 9 mmHg  VENTILATOR SETTINGS: Vent Mode:  [-] PRVC FiO2 (%):  [40 %] 40 % Set Rate:  [18 bmp] 18 bmp Vt Set:  [620 mL] 620 mL PEEP:  [8 cmH20-10 cmH20] 8 cmH20 Plateau Pressure:  [19 cmH20-24 cmH20] 24 cmH20  INTAKE / OUTPUT: I/O last 3 completed shifts: In: 4279.1 [I.V.:1397.4; NG/GT:150; IV Piggyback:500] Out: [id number] [Emesis/NG output:950; [id number]  PHYSICAL EXAMINATION: General: Sedated and intubated , follows some commands, grimaced to abd pressure HEENT: NG/ET tube in place, PERL 4 mm Cardiac: irregular, tachycardic 111 Chest: decreased bs bilaterally Abd: tender, decreased BS Ext: 1+ edema Neuro:sedated, follows commands intermittently    LABS:  BMET  Recent Labs Lab 06/14/15 1340 06/14/15 1646 06/15/15 0344  NA 136 136 137  K 4.3 4.3 3.9  CL 100* 97* 101  CO2 26 25 25   BUN 33* 34* 39*  CREATININE 3.31* 3.30* 3.36*  GLUCOSE 151* 139* 125*    Electrolytes  Recent Labs Lab 06/14/15 0435 06/14/15 1340 06/14/15 1646 06/15/15 0344  CALCIUM 8.4* 8.4* 8.5* 8.5*  MG 2.8* 2.9*  --  2.6*  PHOS 4.2  --  3.6 4.2    CBC  Recent Labs Lab 06/13/15 0424 06/14/15 0435 06/15/15 0344  WBC 10.3 9.5 10.4  HGB 9.1* 9.1* 9.5*  HCT 28.2* 27.8* 28.6*  PLT 42* 49* 53*    Coag's  Recent Labs Lab 06/09/15 0604 06/09/15 1655 06/10/15 0440  APTT 33 36  --   INR 1.07 1.40 1.39    Sepsis Markers  Recent Labs Lab 06/13/15 1020 06/14/15 0435 06/15/15 0344  LATICACIDVEN  1.3  --   --   PROCALCITON 35.13 26.73 22.99    ABG  Recent Labs Lab 06/13/15 1109 06/14/15 0427 06/15/15 0341  PHART 7.334* 7.426 7.402  PCO2ART 48.4* 38.5 41.9  PO2ART 181.0* 80.0 109.0*    Liver Enzymes  Recent Labs Lab 06/09/15 1655 06/10/15 0440  06/14/15 0435 06/14/15 1646 06/15/15 0344  AST 67* 172*  --  52*  --   --   ALT 27 71*  --  73*  --   --   ALKPHOS 42 39  --  74  --   --   BILITOT 1.4* 1.0  --  0.8  --   --   ALBUMIN 2.8* 2.2*  < > 1.8* 1.8* 1.8*  < > = values in this interval not displayed.  Cardiac Enzymes  Recent Labs Lab 06/12/15 0942  TROPONINI 0.04*    Glucose  Recent Labs Lab 06/13/15 1725 06/13/15 2352 06/14/15 0550 06/14/15 1118 06/14/15 1758 06/15/15 0611  GLUCAP 88 130* 138* 137* 124* 131*    Imaging Dg Chest Port 1 View  06/14/2015  CLINICAL DATA:  Hypoxia EXAM: PORTABLE CHEST 1 VIEW COMPARISON:  June 13, 2015 FINDINGS: Endotracheal tube tip is 4.4 cm above the carina. Left and right jugular catheter tips are both in the superior vena cava. Nasogastric tube tip  and side port is below the diaphragm. No pneumothorax. There is no edema or consolidation. The heart size and pulmonary vascularity are normal. No adenopathy. Skin staples overlie the lower chest and visualized upper abdominal regions. IMPRESSION: Tube and catheter positions as described without pneumothorax. No edema or consolidation. Electronically Signed   By: Lowella Grip III M.D.   On: 06/14/2015 07:46   Dg Chest Port 1 View  06/13/2015  CLINICAL DATA:  Right central venous line placement, intubation EXAM: PORTABLE CHEST 1 VIEW COMPARISON:  06/12/15 FINDINGS: Endotracheal tube tip 4.3 cm above the carina. Right IJ central line tip over the superior vena cava. No change left central line. No pneumothorax. Mild enlargement of cardiac silhouette similar to prior study. Mild patchy airspace disease left upper lobe of uncertain significance. This is new from the prior  study. Decreased left pleural effusion. Decreased right pleural effusion. Persistent but improved airspace opacity in the right central lung. IMPRESSION: No pneumothorax. Endotracheal tube as described. Bilateral effusions and infiltrates as described above Electronically Signed   By: Skipper Cliche M.D.   On: 06/13/2015 11:26   Dg Abd Portable 1v  06/13/2015  CLINICAL DATA:  Question ileus. Status post aortic aneurysm repair 06/09/2015. EXAM: PORTABLE ABDOMEN - 1 VIEW COMPARISON:  Single view of the abdomen 06/09/2015. FINDINGS: Surgical staples in the midline are again seen. The bowel gas pattern is unremarkable. No focal bony abnormality is identified. IMPRESSION: Negative for bowel obstruction or ileus. Electronically Signed   By: Inge Rise M.D.   On: 06/13/2015 11:20     STUDIES:  1/21 CT abd >> Rt renal atrophy, 4 mm Lt renal calculus, increased size Lt para-aortic lesion, 3.3 cm AAA infrarenal 1/23 CT angio abd >> concern for enlarging pseudoaneurysm, stenosis of Rt renal artery and Lt renal artery 1/25 Abd aortogram >> occluded Rt renal artery  CULTURES: 2/10 Sputum >>gnr>>  ANTIBIOTICS: 2/10 Vancomycin >> 2/10 Zosyn >>  SIGNIFICANT EVENTS: 2/6 to OR for elective abdominal aortic aneurysm repair, out on vent 2/7 extubated, HD cath placed 2/8 HD > afib with rvr, 1 L removed 2/10 VDRF  LINES/TUBES: 2/06 RIJ PA cath >>> 2/8 2/07 L IJ HC cath  >>>  2/10 ETT >> 2/10 Rt IJ CVL >>  DISCUSSION: 65 yo male with hx of AAA presented with abdominal pain from extension of aneurysm and Rt renal artery occlusion.  Post op course complicated by VDRF, AKI, and A fib.  Developed recurrent respiratory failure with respiratory secretions 2/10 and required re-intubation.  ASSESSMENT / PLAN:  PULMONARY A: Acute hypoxic respiratory failure from pulmonary edema, pleural effusions and concern for HCAP. Wheezing noted 2/10 with hx of smoking. Age and debility will be impedance to  extubation P:   Intubated 2/10 F/u CXR, ABG Scheduled BDs Negative fluid balance as tolerated, 2/12 off pressor  CARDIOVASCULAR A:  Abdominal aortic aneurysm s/p open repair 2/6. A fib with RVR with hx of permanent A fib >> rapid heart 2/10 might be related to respiratory distress >> improved after intubation. Acute on chronic combined CHF. Hx of CAD, HTN. Hypotension, resolved 2/11 , 2/12 with increased bp. Negative i/o with crrt  Intake/Output Summary (Last 24 hours) at 06/15/15 0748 Last data filed at 06/15/15 0700  Gross per 24 hour  Intake 3123.61 ml  Output   4730 ml  Net -1606.39 ml    P:  Amiodarone per cardiology Check cvp Q shift, 2/12 cvp 4  RENAL A:   AKI due to ATN.  Hyperkalemia. P:   CRRT per renal  GASTROINTESTINAL A:   Nutrition. Hx of GERD. Possible ileus. Abd pain . abd film 2/10 non acute P:   NPO >> TNA started 2/11 Protonix for SUP 2/12 recheck abd film   HEMATOLOGIC CBC Latest Ref Rng 06/15/2015 06/14/2015 06/13/2015  WBC 4.0 - 10.5 K/uL 10.4 9.5 10.3  Hemoglobin 13.0 - 17.0 g/dL 9.5(L) 9.1(L) 9.1(L)  Hematocrit 39.0 - 52.0 % 28.6(L) 27.8(L) 28.2(L)  Platelets 150 - 400 K/uL 53(L) 49(L) 42(L)    A:   Anemia of critical illness. Thrombocytopenia.  P:  F/u CBC F/u HIT panel from 2/09>> Continue lovenox for now for DVT prophylaxis  INFECTIOUS A:   Increased respiratory secretions 2/10 with concern for developing HCAP. P:   Day 3 vancomycin, zosyn procalcitonin 26.73  lactic acid, 1.3  sputum culture>>gnr>>  ENDOCRINE A:   No acute issues. P:   Follow glucose on BMP  NEUROLOGIC A:   Sedation. P:   RASS goal -1  Attempted to contact family by phone >> no answer at listed numbers. 2/10 per Dr. Halford Chessman.  2/11 no family at bedside.  Richardson Landry Minor ACNP [facility] P[facility] Pager [phone] till 3 pm If no answer page [phone] 06/15/2015, 7:42 AM   STAFF NOTE: I, Merrie Roof, MD FACP have personally reviewed patient's available  data, including medical history, events of note, physical examination and test results as part of my evaluation. I have discussed with resident/NP and other care providers such as pharmacist, RN and RRT. In addition, I personally evaluated patient and elicited key findings of: awake follows commands, lungs improved ronchi, pcxr resolved rt base, gram neg rod noted on sputum, dc vanc, maintain zosyn and narrwo once ID / sens noted, keep chest pt, mucomyst's x 24 hr further, upright, chair position as able, wean peep to 8 with above improvement then cpap 5 ps 5, goal 1 hr, rsbi abg, repeat pcr in am , neg balance has improved his status and off pressors, maintain neg balance The patient is critically ill with multiple organ systems failure and requires high complexity decision making for assessment and support, frequent evaluation and titration of therapies, application of advanced monitoring technologies and extensive interpretation of multiple databases.   Critical Care Time devoted to patient care services described in this note is 30 Minutes. This time reflects time of care of this signee: Merrie Roof, MD FACP. This critical care time does not reflect procedure time, or teaching time or supervisory time of PA/NP/Med student/Med Resident etc but could involve care discussion time. Rest per NP/medical resident whose note is outlined above and that I agree with   Lavon Paganini. Titus Mould, MD, [place] Pgr: [facility] 06/15/2015 10:59 AM

## 2015-06-15 NOTE — Procedures (Signed)
Extubation Procedure Note  Patient Details:   Name: [name] [name] DOB: 11/06/1950 MRN: [id number]   Pt extubated to 4L [place] per MD. Pt tolerated well with slight HTN, VS otherwise stable. Pt able to vocalize, no stridor noted. RT will continue to monitor.    Evaluation  O2 sats: stable throughout Complications: No apparent complications Patient did tolerate procedure well. Bilateral Breath Sounds: Diminished Suctioning: Oral, Airway Yes  Jetty Peeks 06/15/2015, 2:58 PM

## 2015-06-15 NOTE — Progress Notes (Signed)
Pt seen on CVVHD.  Metabolically stable.  Since K down to 3.9 will change replacement fluids to 4K.  Hemodynamically stable and pulling 100cc/hr.

## 2015-06-16 ENCOUNTER — Inpatient Hospital Stay (HOSPITAL_COMMUNITY): Payer: Medicare Other

## 2015-06-16 DIAGNOSIS — I10 Essential (primary) hypertension: Secondary | ICD-10-CM

## 2015-06-16 DIAGNOSIS — N189 Chronic kidney disease, unspecified: Secondary | ICD-10-CM

## 2015-06-16 DIAGNOSIS — I2510 Atherosclerotic heart disease of native coronary artery without angina pectoris: Secondary | ICD-10-CM

## 2015-06-16 DIAGNOSIS — E785 Hyperlipidemia, unspecified: Secondary | ICD-10-CM

## 2015-06-16 LAB — DIFFERENTIAL
Basophils Absolute: 0.1 10*3/uL (ref 0.0–0.1)
Basophils Relative: 1 %
Eosinophils Absolute: 0.1 10*3/uL (ref 0.0–0.7)
Eosinophils Relative: 1 %
Lymphocytes Relative: 14 %
Lymphs Abs: 1.2 10*3/uL (ref 0.7–4.0)
Monocytes Absolute: 1 10*3/uL (ref 0.1–1.0)
Monocytes Relative: 12 %
Neutro Abs: 6 10*3/uL (ref 1.7–7.7)
Neutrophils Relative %: 72 %

## 2015-06-16 LAB — RENAL FUNCTION PANEL
Albumin: 2 g/dL — ABNORMAL LOW (ref 3.5–5.0)
Anion gap: 12 (ref 5–15)
BUN: 52 mg/dL — ABNORMAL HIGH (ref 6–20)
CO2: 25 mmol/L (ref 22–32)
Calcium: 8.8 mg/dL — ABNORMAL LOW (ref 8.9–10.3)
Chloride: 96 mmol/L — ABNORMAL LOW (ref 101–111)
Creatinine, Ser: 2.94 mg/dL — ABNORMAL HIGH (ref 0.61–1.24)
GFR calc Af Amer: 25 mL/min — ABNORMAL LOW (ref >60)
GFR calc non Af Amer: 21 mL/min — ABNORMAL LOW (ref >60)
Glucose, Bld: 186 mg/dL — ABNORMAL HIGH (ref 65–99)
Phosphorus: 1.8 mg/dL — ABNORMAL LOW (ref 2.5–4.6)
Potassium: 4 mmol/L (ref 3.5–5.1)
Sodium: 133 mmol/L — ABNORMAL LOW (ref 135–145)

## 2015-06-16 LAB — COMPREHENSIVE METABOLIC PANEL
ALT: 50 U/L (ref 17–63)
AST: 32 U/L (ref 15–41)
Albumin: 1.8 g/dL — ABNORMAL LOW (ref 3.5–5.0)
Alkaline Phosphatase: 97 U/L (ref 38–126)
Anion gap: 14 (ref 5–15)
BUN: 49 mg/dL — ABNORMAL HIGH (ref 6–20)
CO2: 24 mmol/L (ref 22–32)
Calcium: 8.3 mg/dL — ABNORMAL LOW (ref 8.9–10.3)
Chloride: 97 mmol/L — ABNORMAL LOW (ref 101–111)
Creatinine, Ser: 3.16 mg/dL — ABNORMAL HIGH (ref 0.61–1.24)
GFR calc Af Amer: 23 mL/min — ABNORMAL LOW (ref >60)
GFR calc non Af Amer: 20 mL/min — ABNORMAL LOW (ref >60)
Glucose, Bld: 152 mg/dL — ABNORMAL HIGH (ref 65–99)
Potassium: 3.7 mmol/L (ref 3.5–5.1)
Sodium: 135 mmol/L (ref 135–145)
Total Bilirubin: 1.3 mg/dL — ABNORMAL HIGH (ref 0.3–1.2)
Total Protein: 5.9 g/dL — ABNORMAL LOW (ref 6.5–8.1)

## 2015-06-16 LAB — PREALBUMIN: Prealbumin: 11.2 mg/dL — ABNORMAL LOW (ref 18–38)

## 2015-06-16 LAB — CBC
HCT: 30 % — ABNORMAL LOW (ref 39.0–52.0)
Hemoglobin: 9.9 g/dL — ABNORMAL LOW (ref 13.0–17.0)
MCH: 28.1 pg (ref 26.0–34.0)
MCHC: 33 g/dL (ref 30.0–36.0)
MCV: 85.2 fL (ref 78.0–100.0)
Platelets: 65 10*3/uL — ABNORMAL LOW (ref 150–400)
RBC: 3.52 MIL/uL — ABNORMAL LOW (ref 4.22–5.81)
RDW: 17.8 % — ABNORMAL HIGH (ref 11.5–15.5)
WBC: 8.4 10*3/uL (ref 4.0–10.5)

## 2015-06-16 LAB — GLUCOSE, CAPILLARY
Glucose-Capillary: 146 mg/dL — ABNORMAL HIGH (ref 65–99)
Glucose-Capillary: 123 mg/dL — ABNORMAL HIGH (ref 65–99)
Glucose-Capillary: 148 mg/dL — ABNORMAL HIGH (ref 65–99)
Glucose-Capillary: 155 mg/dL — ABNORMAL HIGH (ref 65–99)

## 2015-06-16 LAB — TRIGLYCERIDES: Triglycerides: 266 mg/dL — ABNORMAL HIGH (ref <150)

## 2015-06-16 LAB — PROCALCITONIN: Procalcitonin: 8.92 ng/mL

## 2015-06-16 LAB — MAGNESIUM: Magnesium: 2.4 mg/dL (ref 1.7–2.4)

## 2015-06-16 MED ORDER — M.V.I. ADULT IV INJ
INJECTION | INTRAVENOUS | Status: AC
  Administered 2015-06-16: 18:00:00 via INTRAVENOUS
  Filled 2015-06-16: qty 2760

## 2015-06-16 MED ORDER — FAT EMULSION 20 % IV EMUL
240.0000 mL | INTRAVENOUS | Status: AC
  Administered 2015-06-16: 240 mL via INTRAVENOUS
  Filled 2015-06-16: qty 250

## 2015-06-16 MED ORDER — DEXTROSE 5 % IV SOLN
1.0000 g | INTRAVENOUS | Status: AC
  Administered 2015-06-16 – 2015-06-19 (×4): 1 g via INTRAVENOUS
  Filled 2015-06-16 (×4): qty 10

## 2015-06-16 MED ORDER — CETYLPYRIDINIUM CHLORIDE 0.05 % MT LIQD
7.0000 mL | Freq: Two times a day (BID) | OROMUCOSAL | Status: DC
  Administered 2015-06-17 – 2015-07-06 (×18): 7 mL via OROMUCOSAL

## 2015-06-16 MED ORDER — CHLORHEXIDINE GLUCONATE 0.12 % MT SOLN
15.0000 mL | Freq: Two times a day (BID) | OROMUCOSAL | Status: DC
Start: 2015-06-17 — End: 2015-07-07
  Administered 2015-06-17 – 2015-07-07 (×34): 15 mL via OROMUCOSAL
  Filled 2015-06-16 (×29): qty 15

## 2015-06-16 NOTE — Progress Notes (Addendum)
  AAA Progress Note    06/16/2015 11:32 AM 7 Days Post-Op  Subjective:  Awake sitting in chair; no complaints  Afebrile HR 80's-100's Afib 123XX123 systolic 123XX123 0000000  Filed Vitals:   06/16/15 0800 06/16/15 1000  BP: 160/72 142/84  Pulse: 82 96  Temp:    Resp: 21 22    Physical Exam: Cardiac:  Irregular Lungs:  Decreased BS at bases bilaterally Abdomen:  Mildly distended; +BS; -flatus; mild tenderness to palaptation Incisions:  Clean and dry with staples in tact Extremities:  +palpable left DP and +palpable right PT  CBC    Component Value Date/Time   WBC 8.4 06/16/2015 0400   RBC 3.52* 06/16/2015 0400   HGB 9.9* 06/16/2015 0400   HCT 30.0* 06/16/2015 0400   PLT 65* 06/16/2015 0400   MCV 85.2 06/16/2015 0400   MCH 28.1 06/16/2015 0400   MCHC 33.0 06/16/2015 0400   RDW 17.8* 06/16/2015 0400   LYMPHSABS 1.2 06/16/2015 0400   MONOABS 1.0 06/16/2015 0400   EOSABS 0.1 06/16/2015 0400   BASOSABS 0.1 06/16/2015 0400    BMET    Component Value Date/Time   NA 135 06/16/2015 0400   K 3.7 06/16/2015 0400   CL 97* 06/16/2015 0400   CO2 24 06/16/2015 0400   GLUCOSE 152* 06/16/2015 0400   BUN 49* 06/16/2015 0400   CREATININE 3.16* 06/16/2015 0400   CREATININE 1.10 11/07/2014 0804   CALCIUM 8.3* 06/16/2015 0400   GFRNONAA 20* 06/16/2015 0400   GFRNONAA 53* 05/14/2013 0905   GFRAA 23* 06/16/2015 0400   GFRAA 62 05/14/2013 0905    INR    Component Value Date/Time   INR 1.39 06/10/2015 0440   INR 3.6 03/10/2015 0828   INR 1.9 07/20/2010 0817     Intake/Output Summary (Last 24 hours) at 06/16/15 1132 Last data filed at 06/16/15 1000  Gross per 24 hour  Intake 3462.3 ml  Output   5536 ml  Net -2073.7 ml     Assessment/Plan:  65 y.o. male is s/p  Repair of juxtarenal abdominal aortic aneurysm, left renal artery bypass 7 Days Post-Op  -pt extubated yesterday and doing well today -creatinine increased again - on CVVH per renal. Pt is  anuric. -Afib-on amiodarone gtt -nutrition-receiving TPN -HIT negative-pt receiving Lovenox.  Thrombocytopenia improved from yesterday -NGT wit 450cc/24hr yesterday -hgb stable  Leontine Locket, PA-C [facility] [phone]  Abdomen firm, incision intact, feet perfused Still no significant renal function Self dc'd NG today will keep NPO for now.  Reinsert NG if vomits. Possibly clear liquids tomorrow if bowel function continues to return Will probably need tunneled dialysis catheter later this week or early next week if continues to need dialysis Hopefully transition to intermittent HD from CVVH soon Will consider heparin/coumadin for afib if continues to improve over next few days with stable hemoglobin  Appreciate assistance from Renal Cardiology [place], MD [facility] [place]: [phone] Pager: [phone]  06/16/2015 11:32 AM

## 2015-06-16 NOTE — Progress Notes (Signed)
Nutrition Follow-up  DOCUMENTATION CODES:   Not applicable  INTERVENTION:    TPN per pharmacy  NUTRITION DIAGNOSIS:   Inadequate oral intake related to inability to eat as evidenced by NPO status, ongoing  GOAL:   Patient will meet greater than or equal to 90% of their needs, met  MONITOR:   Diet advancement, Labs, Weight trends, Skin, I & O's, TPN prescription   ASSESSMENT:   65 yo Male with PMH as below, which is significant HTN, Atrial Fib on warfarin, CVA, and known AAA. He was recently [facility] to [facility] ED 1/20 with complaints of abdominal pain. CT abdomen at that time showed evidence of hypoperfusion to the R kidney. CT angio showed some enlargement of the seat of an aneurysm. 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 It was on that day that he presented, and underwent the procedures. Estimated 4L blood loss during case. Post operatively he remains on vent in surgical ICU.   Patient s/p procedure 2/6: REPAIR OF JUXTARENAL ABDOMINAL AORTIC ANEURYSM, LEFT RENAL BYPASS  Patient extubated 2/12.  Patient continues on CVVHD.  Nephrology note 2/13 reviewed.  Could transition to intermittent HD in near future if BPs continue to hold.  Patient is receiving TPN via CVC with Clinimix E 5/15 @ 115 ml/hr.  Lipids (20% IVFE at 10 ml/hr) MWF.  Provides daily average of 2166 kcal and 138 grams protein per day. Meets 100% minimum estimated energy needs and 100% minimum estimated protein needs.  Diet Order:  TPN (CLINIMIX) Adult without lytes Diet NPO time  specified TPN (CLINIMIX) Adult without lytes  Skin:  Reviewed, no issues  Last BM:  2/9  Height:   Ht Readings from Last 1 Encounters:  06/09/15 6' (1.829 m)    Weight:   Wt Readings from Last 1 Encounters:  06/16/15 177 lb 11.1 oz (80.6 kg)    Ideal Body Weight:  81 kg  BMI:  Body mass index is 24.09 kg/(m^2).  Re-estimated Nutritional Needs:   Kcal:  2100-2300  Protein:  135-145 gm  Fluid:  per MD  EDUCATION NEEDS:   No education needs identified at this time  Arthur Holms, RD, LDN Pager #: [phone] After-Hours Pager #: [phone]

## 2015-06-16 NOTE — Progress Notes (Signed)
PULMONARY / CRITICAL CARE MEDICINE   Name: [name] [name] MRN: [id number] DOB: 10/11/1950    ADMISSION DATE:  06/09/2015 CONSULTATION DATE:  06/09/2015  REFERRING MD:  Vascular Surgery, Dr. Oneida Alar  CHIEF COMPLAINT:  Post op VDRF  SUBJECTIVE:  No issues. Stable off the vent.   VITAL SIGNS: BP 142/84 mmHg  Pulse 96  Temp(Src) 98.5 F (36.9 C) (Axillary)  Resp 22  Ht 6' (1.829 m)  Wt 177 lb 11.1 oz (80.6 kg)  BMI 24.09 kg/m2  SpO2 100%  HEMODYNAMICS: CVP:  [1 mmHg-14 mmHg] 5 mmHg  VENTILATOR SETTINGS: Vent Mode:  [-] PSV FiO2 (%):  [40 %] 40 % PEEP:  [5 cmH20] 5 cmH20 Pressure Support:  [5 cmH20] 5 cmH20  INTAKE / OUTPUT: I/O last 3 completed shifts: In: 5177.4 [I.V.:1176.2; NG/GT:270; IV Piggyback:300] Out: [id number] [Emesis/NG output:850; [id number]  PHYSICAL EXAMINATION: General: Awake, follows commands HEENT: Moist mucus membranes Cardiac:Irregular. No MRG Chest: No wheeze or crackles.  Abd: soft, decreased BS Ext: 1+ edema Neuro:No gross focal deficits.    LABS:  BMET  Recent Labs Lab 06/15/15 0344 06/15/15 1528 06/16/15 0400  NA 137 135 135  K 3.9 4.1 3.7  CL 101 99* 97*  CO2 25 23 24   BUN 39* 43* 49*  CREATININE 3.36* 2.99* 3.16*  GLUCOSE 125* 148* 152*    Electrolytes  Recent Labs Lab 06/14/15 1340 06/14/15 1646 06/15/15 0344 06/15/15 1528 06/16/15 0400  CALCIUM 8.4* 8.5* 8.5* 8.6* 8.3*  MG 2.9*  --  2.6*  --  2.4  PHOS  --  3.6 4.2 2.8  --     CBC  Recent Labs Lab 06/14/15 0435 06/15/15 0344 06/16/15 0400  WBC 9.5 10.4 8.4  HGB 9.1* 9.5* 9.9*  HCT 27.8* 28.6* 30.0*  PLT 49* 53* 65*    Coag's  Recent Labs Lab 06/09/15 1655 06/10/15 0440  APTT 36  --   INR 1.40 1.39    Sepsis Markers  Recent Labs Lab 06/13/15 1020 06/14/15 0435 06/15/15 0344  LATICACIDVEN 1.3  --   --   PROCALCITON 35.13 26.73 22.99    ABG  Recent Labs Lab 06/14/15 0427 06/15/15 0341 06/15/15 1333  PHART 7.426 7.402 7.435  PCO2ART  38.5 41.9 37.0  PO2ART 80.0 109.0* 102.0*    Liver Enzymes  Recent Labs Lab 06/10/15 0440  06/14/15 0435  06/15/15 0344 06/15/15 1528 06/16/15 0400  AST 172*  --  52*  --   --   --  32  ALT 71*  --  73*  --   --   --  50  ALKPHOS 39  --  74  --   --   --  97  BILITOT 1.0  --  0.8  --   --   --  1.3*  ALBUMIN 2.2*  < > 1.8*  < > 1.8* 1.8* 1.8*  < > = values in this interval not displayed.  Cardiac Enzymes  Recent Labs Lab 06/12/15 0942  TROPONINI 0.04*    Glucose  Recent Labs Lab 06/14/15 2313 06/15/15 0611 06/15/15 1139 06/15/15 1755 06/15/15 2327 06/16/15 0518  GLUCAP 116* 131* 126* 146* 146* 123*    Imaging Dg Abd 1 View  06/15/2015  CLINICAL DATA:  Abdominal tenderness, decreased bowel sounds. No recent bowel movements. Concern for ileus. EXAM: ABDOMEN - 1 VIEW COMPARISON:  Abdomen plain film dated 06/13/2015. FINDINGS: Abdominal wall surgical staples again noted. Enteric tube in the stomach. There is a paucity of bowel gas.  Overall bowel gas pattern is nonobstructive. No evidence of free intraperitoneal air seen. No evidence of abnormal fluid collection. IMPRESSION: No radiographic evidence of bowel obstruction or ileus, although a paucity of bowel gas limits characterization. Enteric tube tip in the stomach, but with proximal side holes at or just below the level of the gastroesophageal junction. Would consider advancing for optimal radiographic positioning. Electronically Signed   By: Franki Cabot M.D.   On: 06/15/2015 12:45   Dg Chest Port 1 View  06/15/2015  CLINICAL DATA:  65 year old male with respiratory failure. EXAM: PORTABLE CHEST 1 VIEW COMPARISON:  06/14/2015 and prior exams FINDINGS: Cardiomediastinal silhouette is unchanged. An endotracheal tube with tip 4.4 cm above carina, right IJ central venous catheter with tip overlying mid SVC, left IJ central venous catheter with tip overlying mid SVC, and NG tube entering the stomach with tip off the field of  view again noted. Minimal left basilar atelectasis is again noted. There is no evidence of pulmonary edema or pneumothorax. IMPRESSION: Little significant change with minimal left basilar atelectasis and support apparatus as described. Electronically Signed   By: Margarette Canada M.D.   On: 06/15/2015 08:25     STUDIES:  1/21 CT abd >> Rt renal atrophy, 4 mm Lt renal calculus, increased size Lt para-aortic lesion, 3.3 cm AAA infrarenal 1/23 CT angio abd >> concern for enlarging pseudoaneurysm, stenosis of Rt renal artery and Lt renal artery 1/25 Abd aortogram >> occluded Rt renal artery  CULTURES: 2/10 Sputum >> Serratia  ANTIBIOTICS: 2/10 Vancomycin >> 2/10 Zosyn >>  SIGNIFICANT EVENTS: 2/6 to OR for elective abdominal aortic aneurysm repair, out on vent 2/7 extubated, HD cath placed 2/8 HD > afib with rvr, 1 L removed 2/10 VDRF  LINES/TUBES: 2/06 RIJ PA cath >>> 2/8 2/07 L IJ HC cath  >>>  2/10 ETT >> 2/10 Rt IJ CVL >>  DISCUSSION: 65 yo male with hx of AAA presented with abdominal pain from extension of aneurysm and Rt renal artery occlusion.  Post op course complicated by VDRF, AKI, and A fib.  Developed recurrent respiratory failure with respiratory secretions 2/10 and required re-intubation.  ASSESSMENT / PLAN:  PULMONARY A: Acute hypoxic respiratory failure from pulmonary edema, pleural effusions and concern for HCAP. Wheezing noted 2/10 with hx of smoking. Age and debility will be impedance to extubation P:   Wean down O2 as toelrated Scheduled BDs Negative fluid balance as tolerated.  CARDIOVASCULAR A:  Abdominal aortic aneurysm s/p open repair 2/6. A fib with RVR with hx of permanent A fib >> rapid heart 2/10 might be related to respiratory distress >> improved after intubation. Acute on chronic combined CHF. Hx of CAD, HTN. Hypotension, resolved 2/11 , 2/12 with increased bp. Negative i/o with crrt P:  Amiodarone per cardiology Stable off  pressors  RENAL A:   AKI due to ATN. Hyperkalemia. P:   CRRT per renal  GASTROINTESTINAL A:   Nutrition. Hx of GERD. Possible ileus. Abd pain . abd film 2/10 non acute P:   NPO >> TNA started 2/11 Protonix for SUP Recheck abd film. If no ileus would start bowel regimen. Swallow eval.   HEMATOLOGIC A:   Anemia of critical illness. Thrombocytopenia. HIT ab negative.  P:  F/u CBC Continue lovenox for now for DVT prophylaxis  INFECTIOUS A:   Increased respiratory secretions 2/10 with concern for developing HCAP. Serratia in sputum P:   Day 4  Zosyn. Will likely narrow once cultures are finalized.  Follow  Procalcitonin.  ENDOCRINE A:   No acute issues. P:   Follow glucose on BMP  NEUROLOGIC A:   No issues P:    Attempted to contact family by phone >> no answer at listed numbers. 2/10 per Dr. Halford Chessman.  2/13 no family at bedside.  Marshell Garfinkel MD [facility] Pager [phone] If no answer or after 3pm call: [phone] 06/16/2015, 10:30 AM

## 2015-06-16 NOTE — Progress Notes (Signed)
Active Problems:   Atrial fibrillation (HCC)   Pseudoaneurysm of aorta (HCC)   Acute respiratory failure with hypoxia (HCC)   AKI (acute kidney injury) ([place])   Acute respiratory failure with hypoxemia (HCC)   Ileus (HCC)   Respiratory failure (HCC)   Subjective:  Extubated (last PM); Denies CP or significant dyspnea.  Objective:  Filed Vitals:   06/16/15 0700 06/16/15 0800 06/16/15 0820 06/16/15 1000  BP: 148/65 160/72  142/84  Pulse: 86 82  96  Temp: 98.5 F (36.9 C)     TempSrc: Axillary     Resp: 19 21  22   Height:      Weight:      SpO2: 100% 100% 100% 100%    Intake/Output from previous day:  Intake/Output Summary (Last 24 hours) at 06/16/15 1150 Last data filed at 06/16/15 1000  Gross per 24 hour  Intake 3462.3 ml  Output   5536 ml  Net -2073.7 ml    Physical Exam: Physical exam: Well-developed well-nourished; chronically ill appearing; diffuse anasarca Skin is warm and dry.  HEENT is normal.  Neck is supple.  Chest with rhonchi and diminished BS bases Cardiovascular: Irreg,-Irreg & ~ rapid in low 100s.  Abdominal exam s/p abd surgery; tender to palpation Extremities show 1-2+ edema.   Lab Results: Basic Metabolic Panel:  Recent Labs  06/15/15 0344 06/15/15 1528 06/16/15 0400  NA 137 135 135  K 3.9 4.1 3.7  CL 101 99* 97*  CO2 25 23 24   GLUCOSE 125* 148* 152*  BUN 39* 43* 49*  CREATININE 3.36* 2.99* 3.16*  CALCIUM 8.5* 8.6* 8.3*  MG 2.6*  --  2.4  PHOS 4.2 2.8  --    CBC:  Recent Labs  06/14/15 0435 06/15/15 0344 06/16/15 0400  WBC 9.5 10.4 8.4  NEUTROABS 7.6  --  6.0  HGB 9.1* 9.5* 9.9*  HCT 27.8* 28.6* 30.0*  MCV 87.4 85.9 85.2  PLT 49* 53* 65*   Cardiac Enzymes: No results for input(s): CKTOTAL, CKMB, CKMBINDEX, TROPONINI in the last 72 hours.   Assessment/Plan:  Principal Problem:   Pseudoaneurysm of aorta (HCC) - status post repair of abdominal aortic aneurysm/renal bypass-management per vascular surgery.  per  Vasc Sgx Active Problems:   Permanent atrial fibrillation (HCC)patient has long-standing atrial fibrillation. HR improved (high normal);   continue amiodarone - convert to PO 400mg  PO BID once taking PO . He received one dose of digoxin - would not use as standing.   TSH normal; echo difficult but suggests EF 35-40, unchanged compared to previous.  Not currently on Anticoagulation (platelets more stable & HITT appears to be normal) - will need to restart anticoagulation when safe per Vasc Sgx - if risk of bleed is excessive, would start Warfarin without bridge.  Follow Hgb closely with recent ABL anemia   CAD - s/p PCI to Cx. 100% RCA CTO - no active Angina; resume statin & BB on d/c   Acute respiratory failure with hypoxemia (HCC)   Hyperlipidemia - restart statin when able.   Essential hypertension - antihypertensive meds have been on hold, restart BB once taking PO (amlodipine next), Hold ACE-I until OK by Nephrology   Acute respiratory failure with hypoxia ([place]) - now extubated. Per PCCM/Nephrology   Acute on chronic kidney failure (HCC) --The majority of his volume is likely secondary to acute renal failure. Dialysis is ongoing.  No further NSVT - keep K+ ~4 & Mg ~2, continue Amiodarone & restart BB when able.  Thrombocytopenia - stable,HITT lab normal range, monitor closely.   [place], [place] 06/16/2015, 11:50 AM

## 2015-06-16 NOTE — Evaluation (Signed)
Physical Therapy Evaluation Patient Details Name: [name] [name] MRN: [id number] DOB: [birth year] Today's Date: 06/16/2015   History of Present Illness  65 yo Male with PMH as below, which is significant HTN, Atrial Fib on warfarin, CVA, and known AAA. He was recently admitted to [facility] ED 1/20 with complaints of abdominal pain. CT abdomen at that time showed evidence of hypoperfusion to the R kidney. CT angio showed some enlargement of the seat of an aneurysm. 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 It was on that day that he presented, and underwent the procedures.   Clinical Impression  Pt with the above recent medical history presents with the PT deficits listed below. PTA pt was independent in all ADLs and mobility. Today pt requiring Mod A x 2 for all bed mobility and transfers. Pt limited by generalized weakness and deconditioned from multiple days of bed rest. Pt eager to get out of bed and will benefit from continued PT services. Recommending SNF upon D/C to maximize recovery towards Mod I for a safe transition home with his wife.    Follow Up Recommendations SNF;Supervision/Assistance - 24 hour    Equipment Recommendations  None recommended by PT    Recommendations for Other Services OT consult     Precautions / Restrictions Precautions Precautions: Fall Precaution Comments: abdominal stitches Restrictions Weight Bearing Restrictions: No      Mobility  Bed Mobility Overal bed mobility: Needs Assistance Bed Mobility: Supine to Sit     Supine to sit:  Mod assist     General bed mobility comments: Min A to bring legs to EOB, MOd A to right trunk, pt needing VC's to use bed rails and to push up  Transfers Overall transfer level: Needs assistance   Transfers: Sit to/from Stand;Stand Pivot Transfers Sit to Stand: Mod assist;+2 physical assistance;+2 safety/equipment Stand pivot transfers: Mod assist;+2 physical assistance;+2 safety/equipment       General transfer comment: Mod A x 2 to stand, pt able to power up from slightly elevated bed, however needing Vc's to stand all the way up as pt displays posterior lean, small shuffled steps to chair, needing cues for sequencing and hand palcement on chair  Ambulation/Gait                Stairs            Wheelchair Mobility    Modified Rankin (Stroke Patients Only)       Balance Overall balance assessment: Needs assistance Sitting-balance support: Bilateral upper extremity supported;Feet supported Sitting balance-Leahy Scale: Fair   Postural control: Posterior lean Standing balance support: Bilateral upper extremity supported;During functional activity Standing balance-Leahy Scale: Poor                               Pertinent Vitals/Pain Pain Assessment: Faces Faces Pain Scale: Hurts even more Pain Location: abdomen Pain Descriptors / Indicators: Operative site guarding Pain Intervention(s): Limited activity within patient's tolerance;Monitored during session    Home Living Family/patient expects to be discharged to:: Private residence Living Arrangements: Spouse/significant other Available  Help at Discharge: Family;Available 24 hours/day Type of Home: House Home Access: Stairs to enter Entrance Stairs-Rails: Right Entrance Stairs-Number of Steps: 4 Home Layout: One level Home Equipment: None      Prior Function Level of Independence: Independent         Comments: retired, still drives and is able to do grocery shopping     Hand  Dominance        Extremity/Trunk Assessment   Upper Extremity Assessment: Generalized weakness           Lower Extremity Assessment: Generalized weakness      Cervical / Trunk Assessment: Kyphotic  Communication   Communication: No difficulties  Cognition Arousal/Alertness: Awake/alert Behavior During Therapy: Flat affect Overall Cognitive Status: Within Functional Limits for tasks assessed                      General Comments      Exercises        Assessment/Plan    PT Assessment Patient needs continued PT services  PT Diagnosis Difficulty walking;Generalized weakness;Acute pain   PT Problem List Decreased strength;Decreased activity tolerance;Decreased balance;Decreased mobility;Decreased range of motion;Decreased coordination;Decreased knowledge of use of DME;Cardiopulmonary status limiting activity  PT Treatment Interventions DME instruction;Gait training;Stair training;Functional mobility training;Therapeutic activities;Therapeutic exercise;Balance training;Patient/family education   PT Goals (Current goals can be found in the Care Plan section) Acute Rehab PT Goals Patient Stated Goal: feel better PT Goal Formulation: With patient Time For Goal Achievement: 06/30/15 Potential to Achieve Goals: Fair    Frequency Min 3X/week   Barriers to discharge        Co-evaluation               End of Session Equipment Utilized During Treatment: Gait belt;Oxygen Activity Tolerance: Patient limited by fatigue Patient left: in chair;with call bell/phone within reach;with nursing/sitter in room Nurse Communication: Mobility status         Time: [id number] PT Time Calculation (min) (ACUTE ONLY): 31 min   Charges:   PT Evaluation $PT Eval Moderate Complexity: 1 Procedure PT Treatments $Therapeutic Activity: 8-22 mins   PT G Codes:        Ara Kussmaul 2015-07-11, 1:28 PM  Ara Kussmaul, Student Physical Therapist Acute Rehab [phone]

## 2015-06-16 NOTE — Progress Notes (Signed)
PARENTERAL NUTRITION CONSULT NOTE  Pharmacy Consult:  TPN Indication:  Prolonged ileus  No Known Allergies  Patient Measurements:  Height: 6' (182.9 cm) Weight: 177 lb 11.1 oz (80.6 kg) IBW/kg (Calculated) : 77.6  Vital Signs: Temp: 98.5 F (36.9 C) (02/13 0700) Temp Source: Axillary (02/13 0700) BP: 148/65 mmHg (02/13 0700) Pulse Rate: 86 (02/13 0700) Intake/Output from previous day: 02/12 0701 - 02/13 0700 In: 3536 [I.V.:720.8; NG/GT:180; IV Piggyback:200; TPN:2435.2] Out: 5998 [Emesis/NG output:450] Intake/Output from this shift:    Labs:  Recent Labs  06/14/15 0435 06/15/15 0344 06/16/15 0400  WBC 9.5 10.4 8.4  HGB 9.1* 9.5* 9.9*  HCT 27.8* 28.6* 30.0*  PLT 49* 53* 65*     Recent Labs  06/14/15 0435 06/14/15 1340 06/14/15 1646 06/15/15 0344 06/15/15 1528 06/16/15 0400  NA 138 136 136 137 135 135  K 4.6 4.3 4.3 3.9 4.1 3.7  CL 101 100* 97* 101 99* 97*  CO2 25 26 25 25 23 24   GLUCOSE 150* 151* 139* 125* 148* 152*  BUN 36* 33* 34* 39* 43* 49*  CREATININE 3.38* 3.31* 3.30* 3.36* 2.99* 3.16*  CALCIUM 8.4* 8.4* 8.5* 8.5* 8.6* 8.3*  MG 2.8* 2.9*  --  2.6*  --  2.4  PHOS 4.2  --  3.6 4.2 2.8  --   PROT 5.6*  --   --   --   --  5.9*  ALBUMIN 1.8*  --  1.8* 1.8* 1.8* 1.8*  AST 52*  --   --   --   --  32  ALT 73*  --   --   --   --  50  ALKPHOS 74  --   --   --   --  97  BILITOT 0.8  --   --   --   --  1.3*  PREALBUMIN 11.1*  --   --   --   --  11.2*  TRIG 449*  --   --   --   --  266*   Estimated Creatinine Clearance: 26.6 mL/min (by C-G formula based on Cr of 3.16).    Recent Labs  06/15/15 1755 06/15/15 2327 06/16/15 0518  GLUCAP 146* 146* 123*    Insulin Requirements in the past 24 hours:  4 units SSI  Assessment: 8 YOM with history of occluded right renal artery discovered on 05/28/15 presented on 06/09/15 for AAA repair, left renal artery bypass and aortic endarterectomy.  Post-op course was complicated by respiratory failure requiring  intubation and AKI requiring dialysis, currently on CRRT.  Pharmacy consulted to initiate TPN for prolonged ileus on 2/10.  GI: GERD - NG output is down to 464mL yesterday. Albumin low at 1.8, prealbumin low at 11.2. Last BM was 2/9. PPI IV  Endo: No hx of DM. CBGs controlled (120-140s) TSH wnl  Lytes: No lytes in TPN, lytes wnl exc K 3.7 (goal > 4) ok for now, CoCa 10. Phos on 2/12 was 2.8, Mg 2.4 (goal > 2) Renal changed CRRT replacement fluids to 4 K  Renal: AKI s/p HD x1 on 2/8, started on CRRT 2/9 (BL SCr 1.2) NS at 10 ml/hr, CRRT pulling 100 cc/hr  Pulm: hx tobacco use, intubated 2/10, Xopenex/Atrovent nebs  Cards: ASCVD / HTN / Afib / PVD - neo drip added for BP support 2/10 PM, now off pressors, on amio gtt  Hepatobil: amylase elevated at 225, AST/ALT slightly elevated, tbili WNL; PMH HLD, Trig back down to 266 after being 449  on 2/11. Tbili 1.3.  AC/Heme: Coumadin for Afib stopped 2/1 PTA - concern with HIT, Hgb 9.5, plts 53  Neuro: hx CVA.  Fentanyl gtt   ID: Day #4 of abx for HCAP. Afebrile, WBC wnl.  Zosyn 2/10 >>  Vanc 2/10 >>  Best Practices: Lovenox, MC  TPN Access: CVC triple lumen placed 06/13/15  TPN start date: 2/10 >>  Current Nutrition:  NPO TPN w/o lytes at 115 ml/hr, no lipids This provides 138 g of protein and 1,960 kcal meeting 100% of protein needs and 95% of kcal needs  Nutritional Goals: per RD 2/10 KCal: 2061 Protein: 135-145 g Fluid: per MD  Plan:  Continue Clinimix 5/15 (no lytes) at 161m/hr Restart IVFE at 32ml/hr on MWF Continue IVMF at [facility] This provides a daily average of 138 g of protein and 2166 kcals meeting 100% of protein and > 100% of kcal needs Continue MVI and trace elements in TPN Continue sensitive SSI and adjust as needed Monitor TPN labs, renal labs  Elenor Quinones, PharmD, BCPS Clinical Pharmacist Pager [phone] 06/16/2015 8:24 AM

## 2015-06-16 NOTE — Progress Notes (Signed)
Pharmacy Antibiotic Note  [name] [name] is a 65 y.o. male admitted on 06/09/2015 with concern of HCAP on day 4 of zosyn. WBC= 8.4, afebrile, SCr= 3.16 (noted on CRRT).  He is noted with serratia marcescens growing in respiratory cultures  Plan: -No zosyn dose changes needed -Consider narrowing to rocephin for a total of 7 days (to end 2/16)?  Height: 6' (182.9 cm) Weight: 177 lb 11.1 oz (80.6 kg) IBW/kg (Calculated) : 77.6  Temp (24hrs), Avg:98.3 F (36.8 C), Min:97.4 F (36.3 C), Max:98.9 F (37.2 C)   Recent Labs Lab 06/12/15 0425  06/13/15 0424 06/13/15 1020  06/14/15 0435 06/14/15 1340 06/14/15 1646 06/15/15 0344 06/15/15 1528 06/16/15 0400  WBC 11.9*  --  10.3  --   --  9.5  --   --  10.4  --  8.4  CREATININE 6.27*  < > 5.05*  --   < > 3.38* 3.31* 3.30* 3.36* 2.99* 3.16*  LATICACIDVEN  --   --   --  1.3  --   --   --   --   --   --   --   < > = values in this interval not displayed.  Estimated Creatinine Clearance: 26.6 mL/min (by C-G formula based on Cr of 3.16).    No Known Allergies  Antimicrobials this admission: Vanco 2/10>>2/12 Zosyn 2/10>>  Dose adjustments this admission: n/a  Microbiology results: 2/10: Endotracheal cx: serratia (sens to cefepime, rocephin, cipro, bactrim)  Thank you for allowing pharmacy to be a part of this patient's care.  Dareen Piano 06/16/2015 10:08 AM

## 2015-06-16 NOTE — Care Management Important Message (Signed)
Important Message  Patient Details  Name: [name] [name] MRN: [id number] Date of Birth: [birth year]   Medicare Important Message Given:  Yes    Merriam Brandner P Georgiann Neider 06/16/2015, 2:05 PM

## 2015-06-16 NOTE — Evaluation (Signed)
Clinical/Bedside Swallow Evaluation Patient Details  Name: [name] [name] MRN: [id number] Date of Birth: February 27, 1951  Today's Date: 06/16/2015 Time: SLP Start Time (ACUTE ONLY): [id number] SLP Stop Time (ACUTE ONLY): 1538 SLP Time Calculation (min) (ACUTE ONLY): 11 min  Past Medical History:  Past Medical History  Diagnosis Date  . Arteriosclerotic cardiovascular disease (ASCVD)     AMI in 2000 treated at [facility]; cath in 12/2006->  Chronic total obstruction of the RCA;  drug-eluting stent placed in M1; inferior hypokinesis with an EF of 45%  . Tobacco abuse, in remission     20 pack years; quit in 2009  . Hypertension   . Chronic anticoagulation   . Atrial fibrillation ([place])     on coumadin for couple of years, stopped plavix at that time  . PVD (peripheral vascular disease) ([place])     Ct angiogram in 2009 revealed stable disease with 80% celiac stenosis,50% right renal artery ,ASCVD with ulceration in the abdominal aortashe  . Nephrolithiasis   . Cerebrovascular disease 2002    carotid stent  . Hyperlipidemia   . Myocardial infarction ([place]) 10 yrs ago  . Testicular carcinoma ([place]) 1990    right orchiectomy  . AAA (abdominal aortic aneurysm) ([place]) 2017    3.3cm  . Dysrhythmia   . Shortness of breath dyspnea     'sometimes'  . GERD (gastroesophageal reflux disease)    Past Surgical History:  Past Surgical History  Procedure Laterality Date  . Appendectomy  2004  . Testicular cancer  1990    right orchiectomy  . Colonoscopy  06/17/2011    INCOMPLETE, PREP POOR. Procedure: COLONOSCOPY;  Surgeon: Daneil Dolin, MD;  [facility];  Service: Endoscopy;  Laterality: N/A;  10:00  . Esophagogastroduodenoscopy  06/17/2011    severe erosive/ulcerative reflux esophagitis, soft noncritical stricture dilatied, small hh, antral erosion   . Colonoscopy  07/15/2011    [id number] rectal and colon polyps  . Colonoscopy N/A 05/22/2015    Procedure: COLONOSCOPY;  Surgeon: Daneil Dolin,  MD;  [facility];  Service: Endoscopy;  Laterality: N/A;  1300 - moved to 2:30 - office to notify  . Esophagogastroduodenoscopy N/A 05/22/2015    Procedure: ESOPHAGOGASTRODUODENOSCOPY (EGD);  Surgeon: Daneil Dolin, MD;  [facility];  Service: Endoscopy;  Laterality: N/A;  . Esophageal dilation N/A 05/22/2015    Procedure: ESOPHAGEAL DILATION;  Surgeon: Daneil Dolin, MD;  [facility];  Service: Endoscopy;  Laterality: N/A;  . Peripheral vascular catheterization N/A 05/28/2015    Procedure: Abdominal Aortogram;  Surgeon: Serafina Mitchell, MD;  Location: [place] [facility];  Service: Cardiovascular;  Laterality: N/A;  . Abdominal aortic aneurysm repair N/A 06/09/2015    Procedure: ANEURYSM ABDOMINAL AORTIC REPAIR;  Surgeon: Elam Dutch, MD;  Location: [place];  Service: Vascular;  Laterality: N/A;  . Aortic/renal bypass Left 06/09/2015    Procedure: LEFT RENAL Artery BYPASS;  Surgeon: Elam Dutch, MD;  Location: [place];  Service: Vascular;  Laterality: Left;  . Aortic endarterecetomy N/A 06/09/2015    Procedure: AORTIC ENDARTERECETOMY;  Surgeon: Elam Dutch, MD;  Location: [facility] OR;  Service: Vascular;  Laterality: N/A;   HPI:  65 yo male with hx of AAA presented with abdominal pain from extension of aneurysm and Rt renal artery occlusion, open repair 2/6, extubated 2/7. Post op course complicated by VDRF, AKI, and A fib. Developed recurrent respiratory failure with respiratory secretions 2/10 and required re-intubation  until 2/12. Possible ileus.    Assessment / Plan / Recommendation Clinical Impression  Pt demonstrates no signs of aspiration or dysphagia with very small sips of thin liquids. Pt would not take larger sips with encouragement to further test airway protection. Given that respiratory function appears WNL and period of intubation was relatively short with no post extuvation dysphonia, likely pt will tolerate thin liquids well. Did not provide  solids trials as pt is suspected to have an ileus. Recommend initiaing a thin liquid diet with MD to advance. No SLP f/u needed.     Aspiration Risk  Mild aspiration risk    Diet Recommendation Thin liquid   Liquid Administration via: Cup;Straw Medication Administration: Whole meds with liquid Compensations: Minimize environmental distractions Postural Changes: Seated upright at 90 degrees    Other  Recommendations     Follow up Recommendations  None    Frequency and Duration            Prognosis        Swallow Study   General HPI: 65 yo male with hx of AAA presented with abdominal pain from extension of aneurysm and Rt renal artery occlusion, open repair 2/6, extubated 2/7. Post op course complicated by VDRF, AKI, and A fib. Developed recurrent respiratory failure with respiratory secretions 2/10 and required re-intubation until 2/12. Possible ileus.  Type of Study: Bedside Swallow Evaluation Previous Swallow Assessment: none Diet Prior to this Study: NPO History of Recent Intubation: Yes Length of Intubations (days): 5 days (total, several days in between) Date extubated: 06/15/15 Behavior/Cognition: Alert;Cooperative Oral Cavity Assessment: Within Functional Limits Oral Care Completed by SLP: No Oral Cavity - Dentition: Missing dentition (no top dentition, no detures) Vision: Functional for self-feeding Self-Feeding Abilities: Needs assist Patient Positioning: Upright in bed Baseline Vocal Quality: Normal Volitional Cough: Strong Volitional Swallow: Able to elicit    Oral/Motor/Sensory Function Overall Oral Motor/Sensory Function: Within functional limits   Ice Chips Ice chips: Within functional limits   Thin Liquid Thin Liquid: Within functional limits Presentation: Cup;Straw;Self Fed    Nectar Thick Nectar Thick Liquid: Not tested   Honey Thick Honey Thick Liquid: Not tested   Puree Puree: Within functional limits (but pt only accepted a minimal  amount) Presentation: Spoon   Solid   GO   Solid: Not tested       Herbie [place], MA CCC-SLP [phone]  Lynann Beaver 06/16/2015,3:43 PM

## 2015-06-16 NOTE — Progress Notes (Signed)
PT Cancellation Note  Patient Details Name: [name] [name] MRN: [id number] DOB: November 29, 1950   Cancelled Treatment:    Reason Eval/Treat Not Completed: Medical issues which prohibited therapy. Pt remains on CVVHD and has no OOB orders. Awaiting for medical clearance from MD to proceed with PT eval and mobility.   Kingsley Callander 06/16/2015, 8:00 AM   Kittie Plater, PT, DPT Pager #: [phone] Office #: [phone]

## 2015-06-16 NOTE — Progress Notes (Addendum)
Wasted 220 ml of fentanyl in sink. Witnessed by Kathleen Argue, RN.  HINTZ, Rachel Moulds M Kathleen Argue S

## 2015-06-16 NOTE — Progress Notes (Signed)
Physical Therapy Note  PT asked to assist RN staff back to bed due to CVVHD.    06/16/15 1600  PT Visit Information  Last PT Received On 06/16/15  Assistance Needed +2  History of Present Illness 65 yo Male with PMH as below, which is significant HTN, Atrial Fib on warfarin, CVA, and known AAA. He was recently admitted to [facility] ED 1/20 with complaints of abdominal pain. CT abdomen at that time showed evidence of hypoperfusion to the R kidney. CT angio showed some enlargement of the seat of an aneurysm. 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 It was on that day that he presented, and underwent the procedures.   PT Time Calculation  PT Start Time (ACUTE ONLY) 1500  PT Stop Time (ACUTE ONLY) 1523  PT Time Calculation (min) (ACUTE ONLY) 23 min  Subjective Data  Subjective PT asked to help assist pt back to bed  Precautions  Precautions Fall  Precaution Comments CVVHD  Restrictions  Weight Bearing Restrictions No  Pain Assessment  Pain Assessment Faces  Faces Pain Scale 6  Pain Location abdomen  Pain Descriptors / Indicators Operative site guarding  Pain Intervention(s) Limited activity within patient's tolerance  Cognition  Arousal/Alertness Awake/alert  Behavior During Therapy Flat affect  Overall Cognitive Status Within Functional Limits for tasks assessed (however NG tube found on floor but unsure how it got there)  Bed Mobility  Overal bed mobility Needs Assistance  Bed Mobility Sit to Supine  Sit to supine Max assist;+2 for physical assistance;+2 for  safety/equipment  General bed mobility comments assist for LE management and lowering of trunk  Transfers  Overall transfer level Needs assistance  Transfers Sit to/from Stand;Stand Pivot Transfers  Sit to Stand Max assist;+2 physical assistance (3rd person for lines)  Stand pivot transfers Max assist;+2 physical assistance;+2 safety/equipment (3rd person for lines)  General transfer comment pt fatigued s/p sitting up in chair x 2.5 hours, increased time to stand, unable to clear feet to step during stand pvt requiring maxA at hips to complete pvt to edge of bed  PT - End of Session  Equipment Utilized During Treatment Gait belt;Oxygen  Activity Tolerance Patient limited by fatigue  Patient left in bed;with call bell/phone within reach;with nursing/sitter in room  Nurse Communication Mobility status  PT - Assessment/Plan  PT Plan Current plan remains appropriate  PT Frequency (ACUTE ONLY) Min 3X/week  Recommendations for Other Services OT consult  Follow Up Recommendations SNF;Supervision/Assistance - 24 hour  PT equipment None recommended by PT  PT Goal Progression  Progress towards PT goals Progressing toward goals  PT General Charges  $$ ACUTE PT VISIT 1 Procedure  PT Treatments  $Therapeutic Activity 23-37 mins    Kittie Plater, PT, DPT Pager #: [phone] Office #: [phone]

## 2015-06-16 NOTE — Progress Notes (Signed)
[facility] Progress Note    Assessment/ Plan:   1. AKI due to ATN: most likely in the setting of poor renal perfusion: a chronically occluded R kidney with a pseudoaneurysm on the left and significant hypovolemia/hypotension intra-operatively and post-operatively with an estimated blood loss of 4L leading to ATN.  Prior to 1/21, the patient's renal function was normal (Cr 1.21 on 04/09/15). Anuric. Transitioned to CVVHD on 2/9. Anuric. Currently pulling 100cc/hr since being off pressors on 12/12. Could consider transition to intermittent HD in the near future if BPs continue to hold.   2. Anion gap metabolic acidosis: most likely secondary to ARF,  resolved with HD.  3. Hypomagnesemia: Resolved, now 2.4.    4. Hyperkalemia: resolved with CVVHD.    5. Hypotension: Resolved. Off pressors since 12/12. No longer has a-line. 110s-150s/60-70s.   5. Atrial fibrillation: patient in Afib with RVR, still on an amio gtt.  Cardiology following. HRs 80s-100s currently.   6. Thrombocytopenia: Most likely secondary to surgery. HIT antibody negative.   Subjective:   Patient extubated yesterday. Breathing better per RN. Still anuric.    Objective:   BP 148/65 mmHg  Pulse 86  Temp(Src) 98.5 F (36.9 C) (Axillary)  Resp 19  Ht 6' (1.829 m)  Wt 177 lb 11.1 oz (80.6 kg)  BMI 24.09 kg/m2  SpO2 100%  Intake/Output Summary (Last 24 hours) at 06/16/15 0842 Last data filed at 06/16/15 0800  Gross per 24 hour  Intake   3528 ml  Output   6004 ml  Net  -2476 ml   Weight change: -13 lb 10.7 oz (-6.2 kg)  Physical Exam: General: Lying in bed intermittently opening eyes, answering yes and no questions.  Left IJ catheter in place and c/d/i.  Cardiovascular: Irregularly irregular. No murmurs, rubs, or gallops noted.  Respiratory: No increased WOB. Anterior chest wall with some transmitted upper airway sounds.  Abdomen: No bowel sounds heard, soft, slightly distended. Some tenderness  at the incision site, no rebound or guarding.  MSK: Trace pitting  edema noted in the pre-sacral region.    Imaging: Dg Abd 1 View  06/15/2015  CLINICAL DATA:  Abdominal tenderness, decreased bowel sounds. No recent bowel movements. Concern for ileus. EXAM: ABDOMEN - 1 VIEW COMPARISON:  Abdomen plain film dated 06/13/2015. FINDINGS: Abdominal wall surgical staples again noted. Enteric tube in the stomach. There is a paucity of bowel gas. Overall bowel gas pattern is nonobstructive. No evidence of free intraperitoneal air seen. No evidence of abnormal fluid collection. IMPRESSION: No radiographic evidence of bowel obstruction or ileus, although a paucity of bowel gas limits characterization. Enteric tube tip in the stomach, but with proximal side holes at or just below the level of the gastroesophageal junction. Would consider advancing for optimal radiographic positioning. Electronically Signed   By: Franki Cabot M.D.   On: 06/15/2015 12:45   Dg Chest Port 1 View  06/15/2015  CLINICAL DATA:  65 year old male with respiratory failure. EXAM: PORTABLE CHEST 1 VIEW COMPARISON:  06/14/2015 and prior exams FINDINGS: Cardiomediastinal silhouette is unchanged. An endotracheal tube with tip 4.4 cm above carina, right IJ central venous catheter with tip overlying mid SVC, left IJ central venous catheter with tip overlying mid SVC, and NG tube entering the stomach with tip off the field of view again noted. Minimal left basilar atelectasis is again noted. There is no evidence of pulmonary edema or pneumothorax. IMPRESSION: Little significant change with minimal left basilar atelectasis and support apparatus as described.  Electronically Signed   By: Margarette Canada M.D.   On: 06/15/2015 08:25    Labs: BMET  Recent Labs Lab 06/12/15 1530 06/13/15 0424 06/13/15 1500 06/14/15 0435 06/14/15 1340 06/14/15 1646 06/15/15 0344 06/15/15 1528 06/16/15 0400  NA 140 140 140 138 136 136 137 135 135  K 5.5* 5.6* 5.5*  4.6 4.3 4.3 3.9 4.1 3.7  CL 103 104 102 101 100* 97* 101 99* 97*  CO2 24 26 23 25 26 25 25 23 24   GLUCOSE 88 83 94 150* 151* 139* 125* 148* 152*  BUN 50* 43* 37* 36* 33* 34* 39* 43* 49*  CREATININE 5.95* 5.05* 4.24* 3.38* 3.31* 3.30* 3.36* 2.99* 3.16*  CALCIUM 8.4* 8.1* 8.4* 8.4* 8.4* 8.5* 8.5* 8.6* 8.3*  PHOS 6.6* 5.5* 4.9* 4.2  --  3.6 4.2 2.8  --    CBC  Recent Labs Lab 06/13/15 0424 06/14/15 0435 06/15/15 0344 06/16/15 0400  WBC 10.3 9.5 10.4 8.4  NEUTROABS  --  7.6  --  6.0  HGB 9.1* 9.1* 9.5* 9.9*  HCT 28.2* 27.8* 28.6* 30.0*  MCV 86.8 87.4 85.9 85.2  PLT 42* 49* 53* 65*    Medications:    . acetylcysteine  4 mL Nebulization BID  . antiseptic oral rinse  7 mL Mouth Rinse 10 times per day  . chlorhexidine gluconate  15 mL Mouth Rinse BID  . enoxaparin (LOVENOX) injection  30 mg Subcutaneous Q24H  . insulin aspart  0-9 Units Subcutaneous 4 times per day  . ipratropium  0.5 mg Nebulization Q6H  . levalbuterol  0.63 mg Nebulization Q6H  . pantoprazole (PROTONIX) IV  40 mg Intravenous Q24H  . piperacillin-tazobactam  3.375 g Intravenous 4 times per day  . sodium chloride flush  10-40 mL Intracatheter Q12H      Kathrine Cords, MD [facility] Family Medicine Resident, PGY-2 06/16/2015, 8:42 AM   Renal Attending: Pt has persistent anuric AKI and is dialysis dependent.  He is more hemodynamically stable and we anticipate transistioning him to IHD in AM Tuesday.  ACP

## 2015-06-17 LAB — GLUCOSE, CAPILLARY
Glucose-Capillary: 133 mg/dL — ABNORMAL HIGH (ref 65–99)
Glucose-Capillary: 145 mg/dL — ABNORMAL HIGH (ref 65–99)
Glucose-Capillary: 153 mg/dL — ABNORMAL HIGH (ref 65–99)
Glucose-Capillary: 157 mg/dL — ABNORMAL HIGH (ref 65–99)
Glucose-Capillary: 181 mg/dL — ABNORMAL HIGH (ref 65–99)

## 2015-06-17 LAB — RENAL FUNCTION PANEL
Albumin: 2 g/dL — ABNORMAL LOW (ref 3.5–5.0)
Anion gap: 15 (ref 5–15)
BUN: 55 mg/dL — ABNORMAL HIGH (ref 6–20)
CO2: 23 mmol/L (ref 22–32)
Calcium: 9.1 mg/dL (ref 8.9–10.3)
Chloride: 97 mmol/L — ABNORMAL LOW (ref 101–111)
Creatinine, Ser: 3 mg/dL — ABNORMAL HIGH (ref 0.61–1.24)
GFR calc Af Amer: 24 mL/min — ABNORMAL LOW (ref >60)
GFR calc non Af Amer: 21 mL/min — ABNORMAL LOW (ref >60)
Glucose, Bld: 152 mg/dL — ABNORMAL HIGH (ref 65–99)
Phosphorus: 2.2 mg/dL — ABNORMAL LOW (ref 2.5–4.6)
Potassium: 3.9 mmol/L (ref 3.5–5.1)
Sodium: 135 mmol/L (ref 135–145)

## 2015-06-17 LAB — CBC
HCT: 29.7 % — ABNORMAL LOW (ref 39.0–52.0)
Hemoglobin: 9.9 g/dL — ABNORMAL LOW (ref 13.0–17.0)
MCH: 28.1 pg (ref 26.0–34.0)
MCHC: 33.3 g/dL (ref 30.0–36.0)
MCV: 84.4 fL (ref 78.0–100.0)
Platelets: 101 10*3/uL — ABNORMAL LOW (ref 150–400)
RBC: 3.52 MIL/uL — ABNORMAL LOW (ref 4.22–5.81)
RDW: 17.3 % — ABNORMAL HIGH (ref 11.5–15.5)
WBC: 11.8 10*3/uL — ABNORMAL HIGH (ref 4.0–10.5)

## 2015-06-17 LAB — MAGNESIUM: Magnesium: 2.4 mg/dL (ref 1.7–2.4)

## 2015-06-17 MED ORDER — TRACE MINERALS CR-CU-MN-SE-ZN 10-1000-500-60 MCG/ML IV SOLN
INTRAVENOUS | Status: AC
  Administered 2015-06-17: 18:00:00 via INTRAVENOUS
  Filled 2015-06-17: qty 2760

## 2015-06-17 MED ORDER — BISACODYL 10 MG RE SUPP
10.0000 mg | Freq: Once | RECTAL | Status: AC
  Administered 2015-06-17: 10 mg via RECTAL
  Filled 2015-06-17: qty 1

## 2015-06-17 NOTE — Progress Notes (Signed)
PULMONARY / CRITICAL CARE MEDICINE   Name: [name] [name] MRN: [id number] DOB: [birth year]    ADMISSION DATE:  06/09/2015 CONSULTATION DATE:  06/09/2015  REFERRING MD:  Vascular Surgery, Dr. Oneida Alar  CHIEF COMPLAINT:  Post op VDRF  SUBJECTIVE:  No issues. Stable off the vent.   VITAL SIGNS: BP 127/59 mmHg  Pulse 96  Temp(Src) 98.2 F (36.8 C) (Oral)  Resp 30  Ht 6' (1.829 m)  Wt 172 lb 9.9 oz (78.3 kg)  BMI 23.41 kg/m2  SpO2 99%  HEMODYNAMICS: CVP:  [0 mmHg-8 mmHg] 8 mmHg  VENTILATOR SETTINGS:    INTAKE / OUTPUT: I/O last 3 completed shifts: In: 5522.5 [I.V.:981.2; NG/GT:120; IV Piggyback:150] Out: [id number] [Emesis/NG output:350; Other:8245]  PHYSICAL EXAMINATION: General: Awake, follows commands HEENT: Moist mucus membranes Cardiac:Irregular. No MRG Chest: No wheeze or crackles.  Abd: soft, decreased BS Ext: 1+ edema Neuro:No gross focal deficits.    LABS:  BMET  Recent Labs Lab 06/16/15 0400 06/16/15 1600 06/17/15 0500  NA 135 133* 135  K 3.7 4.0 3.9  CL 97* 96* 97*  CO2 24 25 23   BUN 49* 52* 55*  CREATININE 3.16* 2.94* 3.00*  GLUCOSE 152* 186* 152*    Electrolytes  Recent Labs Lab 06/15/15 0344 06/15/15 1528 06/16/15 0400 06/16/15 1600 06/17/15 0500  CALCIUM 8.5* 8.6* 8.3* 8.8* 9.1  MG 2.6*  --  2.4  --  2.4  PHOS 4.2 2.8  --  1.8* 2.2*    CBC  Recent Labs Lab 06/15/15 0344 06/16/15 0400 06/17/15 0500  WBC 10.4 8.4 11.8*  HGB 9.5* 9.9* 9.9*  HCT 28.6* 30.0* 29.7*  PLT 53* 65* 101*    Coag's No results for input(s): APTT, INR in the last 168 hours.  Sepsis Markers  Recent Labs Lab 06/13/15 1020 06/14/15 0435 06/15/15 0344 06/16/15 1055  LATICACIDVEN 1.3  --   --   --   PROCALCITON 35.13 26.73 22.99 8.92    ABG  Recent Labs Lab 06/14/15 0427 06/15/15 0341 06/15/15 1333  PHART 7.426 7.402 7.435  PCO2ART 38.5 41.9 37.0  PO2ART 80.0 109.0* 102.0*    Liver Enzymes  Recent Labs Lab 06/14/15 0435   06/16/15 0400 06/16/15 1600 06/17/15 0500  AST 52*  --  32  --   --   ALT 73*  --  50  --   --   ALKPHOS 74  --  97  --   --   BILITOT 0.8  --  1.3*  --   --   ALBUMIN 1.8*  < > 1.8* 2.0* 2.0*  < > = values in this interval not displayed.  Cardiac Enzymes  Recent Labs Lab 06/12/15 0942  TROPONINI 0.04*    Glucose  Recent Labs Lab 06/16/15 0518 06/16/15 1246 06/16/15 1852 06/16/15 2337 06/17/15 0510 06/17/15 1116  GLUCAP 123* 148* 155* 145* 157* 181*    Imaging Dg Abd Portable 1v  06/16/2015  CLINICAL DATA:  Absent bowel sounds question ileus EXAM: PORTABLE ABDOMEN - 1 VIEW COMPARISON:  Portable exam 1106 hours compared to 06/15/2015 FINDINGS: Surgical clips at midline post laparotomy. Tip of nasogastric tube projects over proximal stomach with proximal side-port at approximately the GE junction. Air-filled nondistended loops of bowel in the abdomen. No gross evidence of bowel wall thickening or obstruction. Bones unremarkable. IMPRESSION: Nonspecific bowel gas pattern. Electronically Signed   By: Lavonia Dana M.D.   On: 06/16/2015 11:32     STUDIES:  1/21 CT abd >> Rt renal atrophy,  4 mm Lt renal calculus, increased size Lt para-aortic lesion, 3.3 cm AAA infrarenal 1/23 CT angio abd >> concern for enlarging pseudoaneurysm, stenosis of Rt renal artery and Lt renal artery 1/25 Abd aortogram >> occluded Rt renal artery  CULTURES: 2/10 Sputum >> Serratia  ANTIBIOTICS: 2/10 Vancomycin >> 2/10 Zosyn >> 2/13 2/13 ceftriaxone >>  SIGNIFICANT EVENTS: 2/6 to OR for elective abdominal aortic aneurysm repair, out on vent 2/7 extubated, HD cath placed 2/8 HD > afib with rvr, 1 L removed 2/10 VDRF  LINES/TUBES: 2/06 RIJ PA cath >>> 2/8 2/07 L IJ HC cath  >>>  2/10 ETT >> 2/10 Rt IJ CVL >>  DISCUSSION: 65 yo male with hx of AAA presented with abdominal pain from extension of aneurysm and Rt renal artery occlusion.  Post op course complicated by VDRF, AKI, and A fib.   Developed recurrent respiratory failure with respiratory secretions 2/10 and required re-intubation.  ASSESSMENT / PLAN:  PULMONARY A: Acute hypoxic respiratory failure from pulmonary edema, pleural effusions and concern for HCAP. Wheezing noted 2/10 with hx of smoking. Age and debility will be impedance to extubation P:   Wean down O2 as toelrated Scheduled BDs Negative fluid balance as tolerated.  CARDIOVASCULAR A:  Abdominal aortic aneurysm s/p open repair 2/6. A fib with RVR with hx of permanent A fib >> rapid heart 2/10 might be related to respiratory distress >> improved after intubation. Acute on chronic combined CHF. Hx of CAD, HTN. Hypotension, resolved 2/11 , 2/12 with increased bp. Negative i/o with crrt P:  Amiodarone per cardiology Stable off pressors  RENAL A:   AKI due to ATN. Hyperkalemia. P:   CRRT transition to HD  GASTROINTESTINAL A:   Nutrition. Hx of GERD. Possible ileus. Abd pain . abd film 2/10 non acute P:   NPO >> TNA started 2/11 Protonix for SUP bowel regimen for consitpation Swallow eval.   HEMATOLOGIC A:   Anemia of critical illness. Thrombocytopenia. HIT ab negative.  P:  F/u CBC Continue lovenox for now for DVT prophylaxis  INFECTIOUS A:   Increased respiratory secretions 2/10 with concern for developing HCAP. Serratia in sputum P:   Abx narrowed to ceftriaxone Follow Procalcitonin.  ENDOCRINE A:   No acute issues. P:   Follow glucose on BMP  NEUROLOGIC A:   No issues P:    Attempted to contact family by phone >> no answer at listed numbers. 2/10 per Dr. Halford Chessman.  2/14 no family at bedside.  Marshell Garfinkel MD [facility] Pager [phone] If no answer or after 3pm call: [phone] 06/17/2015, 1:42 PM

## 2015-06-17 NOTE — Progress Notes (Signed)
Principal Problem:   Pseudoaneurysm of aorta (HCC) Active Problems:   Permanent atrial fibrillation (HCC)   CAD - s/p PCI to Cx. 100% RCA CTO   Acute respiratory failure with hypoxemia (HCC)   Hyperlipidemia   Essential hypertension   Acute respiratory failure with hypoxia (HCC)   Acute on chronic kidney failure ([place])   Ileus (HCC)   Testicular cancer ([place])   Respiratory failure (HCC)   Subjective:  Feels better with ETT & NG Tube out. Denies CP or significant dyspnea. No BM or flatus ? Plan is to convert to Intermittent HD from CVVHD  Objective:  Filed Vitals:   06/17/15 0700 06/17/15 0716 06/17/15 0800 06/17/15 0900  BP: 120/71  129/67 133/78  Pulse: 105  100 95  Temp:  97.3 F (36.3 C)    TempSrc:  Oral    Resp: 25  19 25   Height:      Weight:      SpO2: 96%  98% 97%    Intake/Output from previous day:  Intake/Output Summary (Last 24 hours) at 06/17/15 0940 Last data filed at 06/17/15 0900  Gross per 24 hour  Intake 3622.13 ml  Output   5072 ml  Net -1449.87 ml    Physical Exam: Physical exam: Well-developed well-nourished; chronically ill appearing; diffuse anasarca is notably improved Skin is warm and dry.  HEENT is normal.  Neck is supple. Bilateral IJ Lines - unable to assess JVP Chest with rhonchi and diminished BS bases Cardiovascular: Irreg,-Irreg & ~ rapid in 90s to low 100s.  Abdominal exam s/p abd surgery; tender to palpation; hypoactive BS Extremities show 1+ edema.   Lab Results: Basic Metabolic Panel:  Recent Labs  06/16/15 0400 06/16/15 1600 06/17/15 0500  NA 135 133* 135  K 3.7 4.0 3.9  CL 97* 96* 97*  CO2 24 25 23   GLUCOSE 152* 186* 152*  BUN 49* 52* 55*  CREATININE 3.16* 2.94* 3.00*  CALCIUM 8.3* 8.8* 9.1  MG 2.4  --  2.4  PHOS  --  1.8* 2.2*   CBC:  Recent Labs  06/16/15 0400 06/17/15 0500  WBC 8.4 11.8*  NEUTROABS 6.0  --   HGB 9.9* 9.9*  HCT 30.0* 29.7*  MCV 85.2 84.4  PLT 65* 101*   Cardiac  Enzymes: No results for input(s): CKTOTAL, CKMB, CKMBINDEX, TROPONINI in the last 72 hours.  Tele - stable Afib in 80s-low 100s (tele report indicates NSVT - incorrect - Afib with aberrancy)  Assessment/Plan:  Principal Problem:   Pseudoaneurysm of aorta (HCC) - status post repair of abdominal aortic aneurysm/renal bypass-management per vascular surgery.  per Vasc Sgx Active Problems:   Permanent atrial fibrillation [facility]) patient has long-standing atrial fibrillation. HR improved (high normal);   continue amiodarone - convert to PO 400mg  PO BID once taking PO . He received one dose of digoxin - would not use as standing.   TSH normal; echo difficult but suggests EF 35-40, unchanged compared to previous.  Not currently on Anticoagulation (platelets stable & HITT appears to be normal) - will need to restart anticoagulation when safe per Vasc Sgx - if risk of bleed is excessive, would start Warfarin without bridge.  Defer timing of restarting warfarin based upon surgical stability & need for additional procedures.  Follow Hgb closely with recent ABL anemia - H/H stable  Only on Metoprolol @ home for rate control - can restart prior to d/c   CAD - s/p PCI to Cx. 100% RCA CTO - no  active Angina; resume statin & BB on d/c; No need for Plavix   Acute respiratory failure with hypoxemia ([place])   Hyperlipidemia - restart statin when able.   Essential hypertension - antihypertensive meds have been on hold, restart BB once taking PO (amlodipine next), Hold ACE-I until OK by Nephrology -- BP has remained stable   Acute respiratory failure with hypoxia ([place]) - now extubated. Per PCCM/Nephrology   Acute on chronic kidney failure (HCC) --The majority of his volume is likely secondary to acute renal failure. Dialysis is ongoing.  No further NSVT - keep K+ ~4 & Mg ~2, continue Amiodarone & restart BB when able. Thrombocytopenia - stable,HITT lab normal range, monitor closely.   [place], [place] 06/17/2015, 9:40 AM

## 2015-06-17 NOTE — Progress Notes (Signed)
[facility] Progress Note    Assessment/ Plan:   1. AKI due to ATN: most likely in the setting of poor renal perfusion: a chronically occluded R kidney with a pseudoaneurysm on the left and significant hypovolemia/hypotension intra-operatively and post-operatively with an estimated blood loss of 4L leading to ATN.  Prior to 1/21, the patient's renal function was normal (Cr 1.21 on 04/09/15). Anuric. Transitioned to CVVHD on 2/9.  Off CVVHD since this AM.   - Will transition to intermittent HD given BPs stable over the last 48 hours.   2. Anion gap metabolic acidosis: most likely secondary to ARF,  resolved with HD.  3. Hypomagnesemia: Resolved, now 2.4.    4. Hyperkalemia: resolved with CVVHD.    5. Hypotension: Resolved. Off pressors since 12/12. No longer has a-line. 110s-140s/50-70s.   5. Atrial fibrillation: patient in Afib with RVR, still on an amio gtt.  Cardiology following. HRs 80s-100s currently.   6. Thrombocytopenia: Improving. Most likely secondary to surgery. HIT antibody negative.   Subjective:   Patient doing well. No SOB or chest pain. Hasn't eaten anything but ice chips. CVP 7.  Still anuric.    Objective:   BP 129/67 mmHg  Pulse 100  Temp(Src) 97.3 F (36.3 C) (Oral)  Resp 19  Ht 6' (1.829 m)  Wt 172 lb 9.9 oz (78.3 kg)  BMI 23.41 kg/m2  SpO2 98%  Intake/Output Summary (Last 24 hours) at 06/17/15 0847 Last data filed at 06/17/15 0800  Gross per 24 hour  Intake 3612.13 ml  Output   5334 ml  Net -1721.87 ml   Weight change: -5 lb 1.1 oz (-2.3 kg)  Physical Exam: General: Sitting up in bedside chair. More interactive than previously.  Left IJ catheter in place and c/d/i.  Cardiovascular: Irregularly irregular. No murmurs, rubs, or gallops noted.  Respiratory: No increased WOB. Lungs CTAB. On 2L [place].   Abdomen: hypoactive bowel sounds, soft, slightly distended. Some tenderness at the incision site, no rebound or guarding.  MSK: Trace  pitting  edema noted in the pre-sacral region.    Imaging: Dg Abd 1 View  06/15/2015  CLINICAL DATA:  Abdominal tenderness, decreased bowel sounds. No recent bowel movements. Concern for ileus. EXAM: ABDOMEN - 1 VIEW COMPARISON:  Abdomen plain film dated 06/13/2015. FINDINGS: Abdominal wall surgical staples again noted. Enteric tube in the stomach. There is a paucity of bowel gas. Overall bowel gas pattern is nonobstructive. No evidence of free intraperitoneal air seen. No evidence of abnormal fluid collection. IMPRESSION: No radiographic evidence of bowel obstruction or ileus, although a paucity of bowel gas limits characterization. Enteric tube tip in the stomach, but with proximal side holes at or just below the level of the gastroesophageal junction. Would consider advancing for optimal radiographic positioning. Electronically Signed   By: Franki Cabot M.D.   On: 06/15/2015 12:45   Dg Abd Portable 1v  06/16/2015  CLINICAL DATA:  Absent bowel sounds question ileus EXAM: PORTABLE ABDOMEN - 1 VIEW COMPARISON:  Portable exam 1106 hours compared to 06/15/2015 FINDINGS: Surgical clips at midline post laparotomy. Tip of nasogastric tube projects over proximal stomach with proximal side-port at approximately the GE junction. Air-filled nondistended loops of bowel in the abdomen. No gross evidence of bowel wall thickening or obstruction. Bones unremarkable. IMPRESSION: Nonspecific bowel gas pattern. Electronically Signed   By: Lavonia Dana M.D.   On: 06/16/2015 11:32    Labs: BMET  Recent Labs Lab 06/13/15 1500 06/14/15 0435 06/14/15 1340 06/14/15  1646 06/15/15 0344 06/15/15 1528 06/16/15 0400 06/16/15 1600 06/17/15 0500  NA 140 138 136 136 137 135 135 133* 135  K 5.5* 4.6 4.3 4.3 3.9 4.1 3.7 4.0 3.9  CL 102 101 100* 97* 101 99* 97* 96* 97*  CO2 23 25 26 25 25 23 24 25 23   GLUCOSE 94 150* 151* 139* 125* 148* 152* 186* 152*  BUN 37* 36* 33* 34* 39* 43* 49* 52* 55*  CREATININE 4.24* 3.38*  3.31* 3.30* 3.36* 2.99* 3.16* 2.94* 3.00*  CALCIUM 8.4* 8.4* 8.4* 8.5* 8.5* 8.6* 8.3* 8.8* 9.1  PHOS 4.9* 4.2  --  3.6 4.2 2.8  --  1.8* 2.2*   CBC  Recent Labs Lab 06/14/15 0435 06/15/15 0344 06/16/15 0400 06/17/15 0500  WBC 9.5 10.4 8.4 11.8*  NEUTROABS 7.6  --  6.0  --   HGB 9.1* 9.5* 9.9* 9.9*  HCT 27.8* 28.6* 30.0* 29.7*  MCV 87.4 85.9 85.2 84.4  PLT 49* 53* 65* 101*    Medications:    . acetylcysteine  4 mL Nebulization BID  . antiseptic oral rinse  7 mL Mouth Rinse q12n4p  . cefTRIAXone (ROCEPHIN)  IV  1 g Intravenous Q24H  . chlorhexidine  15 mL Mouth Rinse BID  . enoxaparin (LOVENOX) injection  30 mg Subcutaneous Q24H  . insulin aspart  0-9 Units Subcutaneous 4 times per day  . ipratropium  0.5 mg Nebulization Q6H  . levalbuterol  0.63 mg Nebulization Q6H  . pantoprazole (PROTONIX) IV  40 mg Intravenous Q24H  . sodium chloride flush  10-40 mL Intracatheter Q12H      Kathrine Cords, MD [facility] Family Medicine Resident, PGY-2 06/17/2015, 8:47 AM   Renal Attending: Anuric AKI post op AAA and RA surgery cx by post op shock.  Will begin IHD in AM to support.  Will probably need to transition IJ cath to a [facility].  Meet [place] C.

## 2015-06-17 NOTE — Progress Notes (Signed)
[facility] Progress Note  VASCULAR SURGERY ASSESSMENT AND PLAN:  * RENAL: Renal function is stable. Creatinine today is 3.0. He is currently off CVVH. Renal Will dialyze intermittently as needed.  * CARDIAC: patient with atrial fibrillation. He is on amiodarone. At this point I think it is safe to slowly start anticoagulation. It does not look like he will need permanent hemodialysis access.  * PULMONARY: Continue aggressive pulmonary care. He is doing well extubated.  * PTX: Continue physical therapy.  * GI/NUTRITION: On TNA  [name], MD, FACS Beeper [phone] Office: [phone]    Subjective  - POD #8  Patient laying in bed. No questions, concerns or complaints at this time. Denies any pain, SOB, CP and cough. States that he has not had any flatus, BM or urine output.   SPO2 98% on 2 LO2 nasal cannula Resp 19 HR 95, afib BP 129/67 Temp 97.3  Objective Filed Vitals:   06/17/15 0800 06/17/15 0900  BP: 129/67 133/78  Pulse: 100 95  Temp:    Resp: 19 25    Intake/Output Summary (Last 24 hours) at 06/17/15 [id number] Last data filed at 06/17/15 0900  Gross per 24 hour  Intake 3622.13 ml  Output   5072 ml  Net -1449.87 ml    Irregular heart rhythm  Abdomen is mildly distended with hypoactive bowel sounds; patient does not react to palpation  Incisions are clean, dry and staples are intact Palpable right DP/PT and left DP  Assessment/Planning: 65 y.o. male is s/p: Repair of juxtarenal abdominal aortic aneurysm, left renal artery bypass 8 Days Post-Op   Pt still has not had a BM or flatus, will give a suppository to help per Dr. Scot Dock Remain NPO Do not give anticoagulation at this time Receiving TPN for nutrition On amiodarone drip for Afib Creatinine at 3.00 from 2.94 yesterday; switching to IHD per Nephrology WBC 11.8 from 8.4 yesterday and is afebrile at 97.3; will monitor to see if there is a upward trend Hemoglobin remaining  at 9.9 Encourage use of incentive spirometry Reconsult PT/OT for mobilization and therapy  Fredric Mare 06/17/2015 9:37 AM --  Laboratory CBC    Component Value Date/Time   WBC 11.8* 06/17/2015 0500   HGB 9.9* 06/17/2015 0500   HCT 29.7* 06/17/2015 0500   PLT 101* 06/17/2015 0500    BMET    Component Value Date/Time   NA 135 06/17/2015 0500   K 3.9 06/17/2015 0500   CL 97* 06/17/2015 0500   CO2 23 06/17/2015 0500   GLUCOSE 152* 06/17/2015 0500   BUN 55* 06/17/2015 0500   CREATININE 3.00* 06/17/2015 0500   CREATININE 1.10 11/07/2014 0804   CALCIUM 9.1 06/17/2015 0500   GFRNONAA 21* 06/17/2015 0500   GFRNONAA 53* 05/14/2013 0905   GFRAA 24* 06/17/2015 0500   GFRAA 62 05/14/2013 0905    COAG Lab Results  Component Value Date   INR 1.39 06/10/2015   INR 1.40 06/09/2015   INR 1.07 06/09/2015   No results found for: PTT  Antibiotics Anti-infectives    Start     Dose/Rate Route Frequency Ordered Stop   06/16/15 1300  cefTRIAXone (ROCEPHIN) 1 g in dextrose 5 % 50 mL IVPB     1 g 100 mL/hr over 30 Minutes Intravenous Every 24 hours 06/16/15 1154     06/14/15 1200  vancomycin (VANCOCIN) IVPB 1000 mg/200 mL premix  Status:  Discontinued     1,000 mg 200 mL/hr over 60 Minutes Intravenous  Every 24 hours 06/13/15 1014 06/15/15 1101   06/13/15 1200  piperacillin-tazobactam (ZOSYN) IVPB 3.375 g  Status:  Discontinued     3.375 g 100 mL/hr over 30 Minutes Intravenous 4 times per day 06/13/15 1014 06/16/15 1154   06/13/15 1015  vancomycin (VANCOCIN) 1,500 mg in sodium chloride 0.9 % 250 mL IVPB     1,500 mg 250 mL/hr over 60 Minutes Intravenous  Once 06/13/15 1014 06/13/15 1143   06/09/15 2000  cefUROXime (ZINACEF) 1.5 g in dextrose 5 % 50 mL IVPB     1.5 g 100 mL/hr over 30 Minutes Intravenous Every 12 hours 06/09/15 1522 06/10/15 0855   06/09/15 1130  cefUROXime (ZINACEF) 1.5 g in dextrose 5 % 50 mL IVPB  Status:  Discontinued     1.5 g 100 mL/hr over 30 Minutes  Intravenous To Surgery 06/09/15 1123 06/09/15 1509   06/09/15 0700  cefUROXime (ZINACEF) 1.5 g in dextrose 5 % 50 mL IVPB     1.5 g 100 mL/hr over 30 Minutes Intravenous To [facility] 06/08/15 1434 06/09/15 1200     Kayla Checkovich, PA-S  06/17/2015 9:37 AM

## 2015-06-17 NOTE — Progress Notes (Signed)
PARENTERAL NUTRITION CONSULT NOTE  Pharmacy Consult:  TPN Indication:  Prolonged ileus  No Known Allergies  Patient Measurements:  Height: 6' (182.9 cm) Weight: 172 lb 9.9 oz (78.3 kg) IBW/kg (Calculated) : 77.6  Vital Signs: Temp: 97.3 F (36.3 C) (02/14 0716) Temp Source: Oral (02/14 0716) BP: 120/71 mmHg (02/14 0700) Pulse Rate: 105 (02/14 0700) Intake/Output from previous day: 02/13 0701 - 02/14 0700 In: 3632.1 [I.V.:660.8; NG/GT:30; IV Piggyback:50; TPN:2891.3] Out: [id number] [Emesis/NG output:100] Intake/Output from this shift:    Labs:  Recent Labs  06/15/15 0344 06/16/15 0400 06/17/15 0500  WBC 10.4 8.4 11.8*  HGB 9.5* 9.9* 9.9*  HCT 28.6* 30.0* 29.7*  PLT 53* 65* 101*     Recent Labs  06/15/15 0344 06/15/15 1528 06/16/15 0400 06/16/15 1600 06/17/15 0500  NA 137 135 135 133* 135  K 3.9 4.1 3.7 4.0 3.9  CL 101 99* 97* 96* 97*  CO2 25 23 24 25 23   GLUCOSE 125* 148* 152* 186* 152*  BUN 39* 43* 49* 52* 55*  CREATININE 3.36* 2.99* 3.16* 2.94* 3.00*  CALCIUM 8.5* 8.6* 8.3* 8.8* 9.1  MG 2.6*  --  2.4  --  2.4  PHOS 4.2 2.8  --  1.8* 2.2*  PROT  --   --  5.9*  --   --   ALBUMIN 1.8* 1.8* 1.8* 2.0* 2.0*  AST  --   --  32  --   --   ALT  --   --  50  --   --   ALKPHOS  --   --  97  --   --   BILITOT  --   --  1.3*  --   --   PREALBUMIN  --   --  11.2*  --   --   TRIG  --   --  266*  --   --    Estimated Creatinine Clearance: 28 mL/min (by C-G formula based on Cr of 3).    Recent Labs  06/16/15 1852 06/16/15 2337 06/17/15 0510  GLUCAP 155* 145* 157*    Insulin Requirements in the past 24 hours:  5 units SSI  Assessment: 25 YOM with history of occluded right renal artery discovered on 05/28/15 presented on 06/09/15 for AAA repair, left renal artery bypass and aortic endarterectomy.  Post-op course was complicated by respiratory failure requiring intubation and AKI requiring dialysis, currently on CRRT.  Pharmacy consulted to initiate TPN for  prolonged ileus on 2/10.  GI: GERD - NG output is down to 122mL yesterday. Albumin low at 2.0, prealbumin low at 11.2. Last BM was 2/9. PPI IV  Endo: No hx of DM. CBGs controlled (120-150s) TSH wnl  Lytes: No lytes in TPN, lytes wnl exc K 3.9 (goal > 4) ok for now, CoCa 10. Phos is slightly low at 2.2, Mg 2.4 (goal > 2) CRRT replacement fluids are 4 K.   Renal: AKI s/p HD x1 on 2/8, started on CRRT 2/9 (BL SCr 1.2) NS at 10 ml/hr, CRRT pulling 100 cc/hr. Nephro plan is to transition to iHD today since he is more stable  Pulm: hx tobacco use, intubated 2/10, Xopenex/Atrovent nebs  Cards: ASCVD / HTN / Afib / PVD - neo drip added for BP support 2/10 PM, now off pressors, on amio gtt  Hepatobil: amylase elevated at 225, AST/ALT slightly elevated, tbili WNL; PMH HLD, Trig back down to 266 after being 449 on 2/11. Tbili 1.3.  AC/Heme: Coumadin for Afib stopped 2/1  PTA - concern with HIT, Hgb 9.5, plts 53  Neuro: hx CVA.  Fentanyl gtt   ID: Day #5 of abx for HCAP. Afebrile, WBC up to 11.8  Zosyn 2/10 >> 2/13 Vanc 2/10 >> 2/13 Ceftriaxone 2/13 >>   Best Practices: Lovenox, MC  TPN Access: CVC triple lumen placed 06/13/15  TPN start date: 2/10 >>  Current Nutrition:  NPO TPN w/o lytes at 115 ml/hr, no lipids This provides 138 g of protein and 1,960 kcal meeting 100% of protein needs and 95% of kcal needs  Nutritional Goals: per RD 2/10 KCal: 2061 Protein: 135-145 g Fluid: per MD  Plan:  Change Clinimix to E 5/15 at 162m/hr tonight Continue 20% IVFE at 78ml/hr on MWF Continue IVMF at [facility] This provides a daily average of 138 g of protein and 2166 kcals meeting 100% of protein and > 100% of kcal needs Continue MVI and trace elements in TPN Continue sensitive SSI and adjust as needed Monitor TPN labs, renal labs, electrolytes closely  Elenor Quinones, PharmD, BCPS Clinical Pharmacist Pager [phone] 06/17/2015 7:34 AM

## 2015-06-18 ENCOUNTER — Inpatient Hospital Stay (HOSPITAL_COMMUNITY): Payer: Medicare Other

## 2015-06-18 DIAGNOSIS — D72829 Elevated white blood cell count, unspecified: Secondary | ICD-10-CM

## 2015-06-18 DIAGNOSIS — N19 Unspecified kidney failure: Secondary | ICD-10-CM

## 2015-06-18 DIAGNOSIS — I6930 Unspecified sequelae of cerebral infarction: Secondary | ICD-10-CM | POA: Insufficient documentation

## 2015-06-18 DIAGNOSIS — Z9889 Other specified postprocedural states: Secondary | ICD-10-CM

## 2015-06-18 DIAGNOSIS — D62 Acute posthemorrhagic anemia: Secondary | ICD-10-CM | POA: Insufficient documentation

## 2015-06-18 DIAGNOSIS — R0682 Tachypnea, not elsewhere classified: Secondary | ICD-10-CM | POA: Insufficient documentation

## 2015-06-18 DIAGNOSIS — Z8673 Personal history of transient ischemic attack (TIA), and cerebral infarction without residual deficits: Secondary | ICD-10-CM | POA: Insufficient documentation

## 2015-06-18 DIAGNOSIS — Z8679 Personal history of other diseases of the circulatory system: Secondary | ICD-10-CM

## 2015-06-18 LAB — CBC
HCT: 26.4 % — ABNORMAL LOW (ref 39.0–52.0)
Hemoglobin: 9.1 g/dL — ABNORMAL LOW (ref 13.0–17.0)
MCH: 28.9 pg (ref 26.0–34.0)
MCHC: 34.5 g/dL (ref 30.0–36.0)
MCV: 83.8 fL (ref 78.0–100.0)
Platelets: 123 10*3/uL — ABNORMAL LOW (ref 150–400)
RBC: 3.15 MIL/uL — ABNORMAL LOW (ref 4.22–5.81)
RDW: 17 % — ABNORMAL HIGH (ref 11.5–15.5)
WBC: 12.7 10*3/uL — ABNORMAL HIGH (ref 4.0–10.5)

## 2015-06-18 LAB — GLUCOSE, CAPILLARY
Glucose-Capillary: 149 mg/dL — ABNORMAL HIGH (ref 65–99)
Glucose-Capillary: 159 mg/dL — ABNORMAL HIGH (ref 65–99)
Glucose-Capillary: 164 mg/dL — ABNORMAL HIGH (ref 65–99)
Glucose-Capillary: 168 mg/dL — ABNORMAL HIGH (ref 65–99)
Glucose-Capillary: 185 mg/dL — ABNORMAL HIGH (ref 65–99)

## 2015-06-18 LAB — RENAL FUNCTION PANEL
Albumin: 2 g/dL — ABNORMAL LOW (ref 3.5–5.0)
Anion gap: 18 — ABNORMAL HIGH (ref 5–15)
BUN: 114 mg/dL — ABNORMAL HIGH (ref 6–20)
CO2: 18 mmol/L — ABNORMAL LOW (ref 22–32)
Calcium: 9.1 mg/dL (ref 8.9–10.3)
Chloride: 93 mmol/L — ABNORMAL LOW (ref 101–111)
Creatinine, Ser: 5.55 mg/dL — ABNORMAL HIGH (ref 0.61–1.24)
GFR calc Af Amer: 11 mL/min — ABNORMAL LOW (ref >60)
GFR calc non Af Amer: 10 mL/min — ABNORMAL LOW (ref >60)
Glucose, Bld: 148 mg/dL — ABNORMAL HIGH (ref 65–99)
Phosphorus: 5.8 mg/dL — ABNORMAL HIGH (ref 2.5–4.6)
Potassium: 4 mmol/L (ref 3.5–5.1)
Sodium: 129 mmol/L — ABNORMAL LOW (ref 135–145)

## 2015-06-18 LAB — HEPATITIS B SURFACE ANTIGEN: Hepatitis B Surface Ag: NEGATIVE

## 2015-06-18 LAB — PROCALCITONIN: Procalcitonin: 7.03 ng/mL

## 2015-06-18 LAB — MAGNESIUM: Magnesium: 2.5 mg/dL — ABNORMAL HIGH (ref 1.7–2.4)

## 2015-06-18 MED ORDER — TRACE MINERALS CR-CU-MN-SE-ZN 10-1000-500-60 MCG/ML IV SOLN
INTRAVENOUS | Status: AC
  Administered 2015-06-18: 18:00:00 via INTRAVENOUS
  Filled 2015-06-18: qty 2760

## 2015-06-18 MED ORDER — FENTANYL CITRATE (PF) 100 MCG/2ML IJ SOLN
INTRAMUSCULAR | Status: AC
  Administered 2015-06-18: 50 ug via INTRAVENOUS
  Filled 2015-06-18: qty 2

## 2015-06-18 MED ORDER — PENTAFLUOROPROP-TETRAFLUOROETH EX AERO: 1.0000 "application " | INHALATION_SPRAY | CUTANEOUS | Status: DC | PRN

## 2015-06-18 MED ORDER — HEPARIN SODIUM (PORCINE) 1000 UNIT/ML DIALYSIS: 1000.0000 [IU] | INTRAMUSCULAR | Status: DC | PRN

## 2015-06-18 MED ORDER — FAT EMULSION 20 % IV EMUL
240.0000 mL | INTRAVENOUS | Status: AC
  Administered 2015-06-18: 240 mL via INTRAVENOUS
  Filled 2015-06-18: qty 250

## 2015-06-18 MED ORDER — LIDOCAINE-PRILOCAINE 2.5-2.5 % EX CREA
1.0000 "application " | TOPICAL_CREAM | CUTANEOUS | Status: DC | PRN
  Filled 2015-06-18: qty 5

## 2015-06-18 MED ORDER — HEPARIN SODIUM (PORCINE) 1000 UNIT/ML DIALYSIS: 20.0000 [IU]/kg | INTRAMUSCULAR | Status: DC | PRN

## 2015-06-18 MED ORDER — ONDANSETRON HCL 4 MG/2ML IJ SOLN
INTRAMUSCULAR | Status: AC
  Administered 2015-06-18: 4 mg via INTRAVENOUS
  Filled 2015-06-18: qty 2

## 2015-06-18 MED ORDER — SODIUM CHLORIDE 0.9 % IV SOLN: 100.0000 mL | INTRAVENOUS | Status: DC | PRN

## 2015-06-18 MED ORDER — ALTEPLASE 2 MG IJ SOLR
2.0000 mg | Freq: Once | INTRAMUSCULAR | Status: DC
  Filled 2015-06-18: qty 2

## 2015-06-18 MED ORDER — ALTEPLASE 2 MG IJ SOLR
2.0000 mg | Freq: Once | INTRAMUSCULAR | Status: DC | PRN
  Filled 2015-06-18: qty 2

## 2015-06-18 MED ORDER — INSULIN ASPART 100 UNIT/ML ~~LOC~~ SOLN
0.0000 [IU] | SUBCUTANEOUS | Status: DC
  Administered 2015-06-18 (×4): 3 [IU] via SUBCUTANEOUS
  Administered 2015-06-19 – 2015-06-25 (×19): 2 [IU] via SUBCUTANEOUS
  Administered 2015-06-27: 3 [IU] via SUBCUTANEOUS
  Administered 2015-06-27 – 2015-06-28 (×5): 2 [IU] via SUBCUTANEOUS
  Administered 2015-06-28: 3 [IU] via SUBCUTANEOUS
  Administered 2015-06-29 – 2015-06-30 (×3): 2 [IU] via SUBCUTANEOUS

## 2015-06-18 MED ORDER — LIDOCAINE HCL (PF) 1 % IJ SOLN: 5.0000 mL | INTRAMUSCULAR | Status: DC | PRN

## 2015-06-18 NOTE — Consult Note (Signed)
Physical Medicine and Rehabilitation Consult Reason for Consult: Deconditioning after abdominal aortic aneurysm repair/multi-medical Referring Physician: Dr. Oneida Alar   HPI: [name] [name] is a 65 y.o. 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 Patient lives with his wife in [place]. Independent prior to admission. One level home with 4 steps to entry. His wife works 1/2 of the week. Presented 05/27/2015 with abdominal pain. CT the abdomen showed evidence of hypoperfusion to the right kidney. CT angiogram showed enlargement of aneurysm. He was seen by vascular surgery underwent arterial aortogram discovered occluded right renal artery possible source of pseudoaneurysm on left lateral side of aorta. Underwent elective abdominal aortic aneurysm repair with left renal bypass procedure 06/09/2015 per Dr. Eden Lathe. Hospital course respiratory failure complicated by acute renal failure with renal service is consulted. Acute blood loss anemia transfused. It was felt that acute renal injury likely secondary to poor renal perfusion. He did have intermittent hemodialysis and has been off of dialysis since 06/17/2015 with latest creatinine 5.55. Bouts of thrombocytopenia and monitored. Coumadin has been discontinued. Lovenox has been initiated for DVT prophylaxis. Currently on a clear liquid diet. Therapy evaluations have been completed and ongoing with slow progress. M.D. has requested physical medicine rehabilitation consult.  Review of Systems  Constitutional: Negative for fever and chills.  HENT: Negative for hearing loss.   Eyes: Negative for blurred vision and double vision.  Respiratory: Negative for cough.        Shortness of breath with exertion  Cardiovascular: Negative for chest pain and leg swelling.    Gastrointestinal: Positive for abdominal pain and constipation. Negative for nausea.       GERD  Genitourinary: Negative for dysuria and hematuria.  Musculoskeletal: Positive for myalgias and joint pain.  Skin: Negative for rash.  Neurological: Negative for weakness and headaches.  All other systems reviewed and are negative.  Past Medical History  Diagnosis Date  . Arteriosclerotic cardiovascular disease (ASCVD)     AMI in 2000 treated at [facility]; cath in 12/2006->  Chronic total obstruction of the RCA;  drug-eluting stent placed in M1; inferior hypokinesis with an EF of 45%  . Tobacco abuse, in remission     20 pack years; quit in 2009  . Hypertension   . Chronic anticoagulation   . Atrial fibrillation ([place])     on coumadin for couple of years, stopped plavix at that time  . PVD (peripheral vascular disease) ([place])     Ct angiogram in 2009 revealed stable disease with 80% celiac stenosis,50% right renal artery ,ASCVD with ulceration in the abdominal aortashe  . Nephrolithiasis   . Cerebrovascular disease 2002    carotid stent  . Hyperlipidemia   . Myocardial infarction ([place]) 10 yrs ago  . Testicular carcinoma ([place]) 1990    right orchiectomy  . AAA (abdominal aortic aneurysm) ([place]) 2017    3.3cm  . Dysrhythmia   . Shortness of breath dyspnea     'sometimes'  . GERD (gastroesophageal reflux disease)    Past Surgical History  Procedure Laterality Date  . Appendectomy  2004  . Testicular cancer  1990    right orchiectomy  . Colonoscopy  06/17/2011    INCOMPLETE, PREP POOR. Procedure: COLONOSCOPY;  Surgeon: Daneil Dolin, MD;  [facility];  Service: Endoscopy;  Laterality: N/A;  10:00  .  Esophagogastroduodenoscopy  06/17/2011    severe erosive/ulcerative reflux esophagitis, soft noncritical stricture dilatied, small hh, antral erosion   . Colonoscopy  07/15/2011    [id number] rectal and colon polyps  . Colonoscopy N/A 05/22/2015    Procedure: COLONOSCOPY;   Surgeon: Daneil Dolin, MD;  [facility];  Service: Endoscopy;  Laterality: N/A;  1300 - moved to 2:30 - office to notify  . Esophagogastroduodenoscopy N/A 05/22/2015    Procedure: ESOPHAGOGASTRODUODENOSCOPY (EGD);  Surgeon: Daneil Dolin, MD;  [facility];  Service: Endoscopy;  Laterality: N/A;  . Esophageal dilation N/A 05/22/2015    Procedure: ESOPHAGEAL DILATION;  Surgeon: Daneil Dolin, MD;  [facility];  Service: Endoscopy;  Laterality: N/A;  . Peripheral vascular catheterization N/A 05/28/2015    Procedure: Abdominal Aortogram;  Surgeon: Serafina Mitchell, MD;  Location: [place] [facility];  Service: Cardiovascular;  Laterality: N/A;  . Abdominal aortic aneurysm repair N/A 06/09/2015    Procedure: ANEURYSM ABDOMINAL AORTIC REPAIR;  Surgeon: Elam Dutch, MD;  Location: [facility];  Service: Vascular;  Laterality: N/A;  . Aortic/renal bypass Left 06/09/2015    Procedure: LEFT RENAL Artery BYPASS;  Surgeon: Elam Dutch, MD;  Location: [place];  Service: Vascular;  Laterality: Left;  . Aortic endarterecetomy N/A 06/09/2015    Procedure: AORTIC ENDARTERECETOMY;  Surgeon: Elam Dutch, MD;  Location: [facility];  Service: Vascular;  Laterality: N/A;   Family History  Problem Relation Age of Onset  . Colon cancer Father 30    deceased  . Prostate cancer Father   . Liver disease Neg Hx   . Anesthesia problems Neg Hx   . Hypotension Neg Hx   . Malignant hyperthermia Neg Hx   . Pseudochol deficiency Neg Hx    Social History:  reports that he quit smoking about 3 weeks ago. His smoking use included Cigarettes. He has a 4 pack-year smoking history. He has never used smokeless tobacco. He reports that he does not drink alcohol or use illicit drugs. Allergies: No Known Allergies Medications Prior to Admission  Medication Sig Dispense Refill  . albuterol (PROVENTIL HFA;VENTOLIN HFA) 108 (90 BASE) MCG/ACT inhaler Inhale 2-4 puffs into the lungs every 4 (four) hours  as needed for wheezing or shortness of breath. 1 Inhaler 0  . amLODipine (NORVASC) 10 MG tablet take 1 tablet by mouth once daily 90 tablet 3  . atorvastatin (LIPITOR) 20 MG tablet Take 1 tablet (20 mg total) by mouth at bedtime. 30 tablet 3  . cloNIDine (CATAPRES) 0.1 MG tablet Take 1 tablet (0.1 mg total) by mouth 2 (two) times daily. 180 tablet 3  . lisinopril (PRINIVIL,ZESTRIL) 40 MG tablet Take 1 tablet (40 mg total) by mouth daily. 90 tablet 3  . metoprolol tartrate (LOPRESSOR) 25 MG tablet Take 1 tablet (25 mg total) by mouth 2 (two) times daily. 60 tablet 0  . Multiple Vitamin (MULITIVITAMIN WITH MINERALS) TABS Take 1 tablet by mouth daily.    . Omega-3 Fatty Acids (FISH OIL) 1000 MG CAPS Take 1,000 mg by mouth daily.   0  . pantoprazole (PROTONIX) 40 MG tablet Take 40 mg by mouth daily.    [name] warfarin (COUMADIN) 5 MG tablet Take 1 1/2 tablets daily (Patient taking differently: Take 5-7.5 mg by mouth daily. Take one tablet on all days except on Wednesdays and Thursdays take one and one-half tablets) 45 tablet 6  . diphenhydrAMINE (BENADRYL) 25 MG tablet Take 25 mg by  mouth daily as needed for allergies.    . fexofenadine (ALLEGRA) 180 MG tablet Take 1 tablet (180 mg total) by mouth daily. 30 tablet 2  . Multiple Vitamin (ONE-A-DAY MENS PO) Take 1 tablet by mouth daily.    . nitroGLYCERIN (NITROSTAT) 0.4 MG SL tablet place 1 tablet under the tongue if needed every 5 minutes for chest pain for 3 doses IF NO RELIEF AFTER 3RD DOSE CALL PRESCRIBER OR 911. 25 tablet 2  . polyethylene glycol-electrolytes (NULYTELY/GOLYTELY) 420 g solution Take 4,000 mLs by mouth once. (Patient not taking: Reported on 06/03/2015) 4000 mL 0  . traZODone (DESYREL) 50 MG tablet Take 0.5-1 tablets (25-50 mg total) by mouth at bedtime as needed for sleep. (Patient taking differently: Take 50 mg by mouth at bedtime as needed for sleep. ) 30 tablet 3    Home: Home Living Family/patient expects to be discharged to::  Private residence Living Arrangements: Spouse/significant other Available Help at Discharge: Family, Available 24 hours/day Type of Home: House Home Access: Stairs to enter Technical brewer of Steps: 4 Entrance Stairs-Rails: Right Home Layout: One level Bathroom Shower/Tub: Tub/shower unit Home Equipment: None  Functional History: Prior Function Level of Independence: Independent Comments: retired, still drives and is able to do grocery shopping Functional Status:  Mobility: Bed Mobility Overal bed mobility: Needs Assistance Bed Mobility: Sit to Supine Supine to sit: Mod assist Sit to supine: Max assist, +2 for physical assistance, +2 for safety/equipment General bed mobility comments: assist for LE management and lowering of trunk Transfers Overall transfer level: Needs assistance Transfers: Sit to/from Stand, Stand Pivot Transfers Sit to Stand: Max assist, +2 physical assistance (3rd person for lines) Stand pivot transfers: Max assist, +2 physical assistance, +2 safety/equipment (3rd person for lines) General transfer comment: pt fatigued s/p sitting up in chair x 2.5 hours, increased time to stand, unable to clear feet to step during stand pvt requiring maxA at hips to complete pvt to edge of bed      ADL:    Cognition: Cognition Overall Cognitive Status: Within Functional Limits for tasks assessed (however NG tube found on floor but unsure how it got there) Orientation Level: Oriented X4 Cognition Arousal/Alertness: Awake/alert Behavior During Therapy: Flat affect Overall Cognitive Status: Within Functional Limits for tasks assessed (however NG tube found on floor but unsure how it got there)  Blood pressure 142/61, pulse 86, temperature 97.8 F (36.6 C), temperature source Oral, resp. rate 17, height 6' (1.829 m), weight 82.645 kg (182 lb 3.2 oz), SpO2 97 %. Physical Exam  Vitals reviewed. Constitutional: He is oriented to person, place, and time. He appears  well-developed and well-nourished.  HENT:  Head: Normocephalic and atraumatic.  Eyes: Conjunctivae and EOM are normal.  Neck: Normal range of motion. Neck supple. No thyromegaly present.  Cardiovascular:  Irregularly irregular  Respiratory: Effort normal and breath sounds normal.  GI: Soft. Bowel sounds are normal. He exhibits no distension.  Musculoskeletal: He exhibits edema (1+). He exhibits no tenderness.  Neurological: He is alert and oriented to person, place, and time.  Motor: B/l UE 4/5 proximal to distal B/l LE 4+/5 proximal to distal  Skin: Skin is warm and dry.  Surgical site with staples intact  Psychiatric: He has a normal mood and affect. His behavior is normal.    Results for orders placed or performed during the hospital encounter of 06/09/15 (from the past 24 hour(s))  Glucose, capillary     Status: Abnormal   Collection Time: 06/17/15 11:16  AM  Result Value Ref Range   Glucose-Capillary 181 (H) 65 - 99 mg/dL   Comment 1 Capillary Specimen    Comment 2 Notify RN   Glucose, capillary     Status: Abnormal   Collection Time: 06/17/15  6:23 PM  Result Value Ref Range   Glucose-Capillary 153 (H) 65 - 99 mg/dL   Comment 1 Capillary Specimen    Comment 2 Notify RN   Glucose, capillary     Status: Abnormal   Collection Time: 06/17/15 11:37 PM  Result Value Ref Range   Glucose-Capillary 133 (H) 65 - 99 mg/dL   Comment 1 Capillary Specimen    Comment 2 Notify RN    Comment 3 Document in Chart   Renal function panel (daily at 0500)     Status: Abnormal   Collection Time: 06/18/15  4:30 AM  Result Value Ref Range   Sodium 129 (L) 135 - 145 mmol/L   Potassium 4.0 3.5 - 5.1 mmol/L   Chloride 93 (L) 101 - 111 mmol/L   CO2 18 (L) 22 - 32 mmol/L   Glucose, Bld 148 (H) 65 - 99 mg/dL   BUN 114 (H) 6 - 20 mg/dL   Creatinine, Ser 5.55 (H) 0.61 - 1.24 mg/dL   Calcium 9.1 8.9 - 10.3 mg/dL   Phosphorus 5.8 (H) 2.5 - 4.6 mg/dL   Albumin 2.0 (L) 3.5 - 5.0 g/dL   GFR calc non  Af Amer 10 (L) >60 mL/min   GFR calc Af Amer 11 (L) >60 mL/min   Anion gap 18 (H) 5 - 15  Magnesium     Status: Abnormal   Collection Time: 06/18/15  4:30 AM  Result Value Ref Range   Magnesium 2.5 (H) 1.7 - 2.4 mg/dL  Procalcitonin     Status: None   Collection Time: 06/18/15  4:30 AM  Result Value Ref Range   Procalcitonin 7.03 ng/mL  CBC     Status: Abnormal   Collection Time: 06/18/15  5:00 AM  Result Value Ref Range   WBC 12.7 (H) 4.0 - 10.5 K/uL   RBC 3.15 (L) 4.22 - 5.81 MIL/uL   Hemoglobin 9.1 (L) 13.0 - 17.0 g/dL   HCT 26.4 (L) 39.0 - 52.0 %   MCV 83.8 78.0 - 100.0 fL   MCH 28.9 26.0 - 34.0 pg   MCHC 34.5 30.0 - 36.0 g/dL   RDW 17.0 (H) 11.5 - 15.5 %   Platelets 123 (L) 150 - 400 K/uL  Glucose, capillary     Status: Abnormal   Collection Time: 06/18/15  6:06 AM  Result Value Ref Range   Glucose-Capillary 149 (H) 65 - 99 mg/dL   Comment 1 Capillary Specimen    Comment 2 Notify RN    Comment 3 Document in Chart   Glucose, capillary     Status: Abnormal   Collection Time: 06/18/15  8:22 AM  Result Value Ref Range   Glucose-Capillary 159 (H) 65 - 99 mg/dL   Comment 1 Capillary Specimen    Comment 2 Notify RN    Dg Abd Portable 1v  06/16/2015  CLINICAL DATA:  Absent bowel sounds question ileus EXAM: PORTABLE ABDOMEN - 1 VIEW COMPARISON:  Portable exam 1106 hours compared to 06/15/2015 FINDINGS: Surgical clips at midline post laparotomy. Tip of nasogastric tube projects over proximal stomach with proximal side-port at approximately the GE junction. Air-filled nondistended loops of bowel in the abdomen. No gross evidence of bowel wall thickening or obstruction. Bones unremarkable.  IMPRESSION: Nonspecific bowel gas pattern. Electronically Signed   By: Lavonia Dana M.D.   On: 06/16/2015 11:32    Assessment/Plan: Diagnosis: Deconditioning after abdominal aortic aneurysm repair/multi-medical Labs and images independently reviewed.  Records reviewed and summated  above.  1. Does the need for close, 24 hr/day medical supervision in concert with the patient's rehab needs make it unreasonable for this patient to be served in a less intensive setting? Yes  2. 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 Due to safety, skin/wound care, disease management and patient education, does the patient require 24 hr/day rehab nursing? Yes 4. Does the patient require coordinated care of a physician, rehab nurse, PT (1-2 hrs/day, 5 days/week) and OT (1-2 hrs/day, 5 days/week) to address physical and functional deficits in the context of the above medical diagnosis(es)? Yes Addressing deficits in the following areas: balance, endurance, locomotion, strength, transferring, bathing, dressing, toileting and psychosocial support 5. Can the patient actively participate in an intensive therapy program of at least 3 hrs of therapy per day at least 5 days per week? Yes 6. The potential for patient to make measurable gains while on inpatient rehab is N/A 7. Anticipated functional outcomes upon discharge from inpatient rehab are n/a  with PT, n/a with OT, n/a with SLP. 8. Estimated rehab length of stay to reach the above functional goals is: N/A 9. Does the patient have adequate social supports and living environment to accommodate  these discharge functional goals? N/A 10. Anticipated D/C setting: Other 11. Anticipated post D/C treatments: SNF 12. Overall Rehab/Functional Prognosis: good  RECOMMENDATIONS: This patient's condition is appropriate for continued rehabilitative care in the following setting: Pt requiring max assist for transfer and bed mobility.  He does not have 24/7 support at discharge and will unlikely be able to achieve an modified independent level of functioning after a short IRF stay.  Agree with PT, recommend SNF. Patient has agreed to participate in recommended program. Yes Note that insurance prior authorization may be required for reimbursement for recommended care.  Comment: Rehab Admissions Coordinator to follow up.  Delice Lesch, MD 06/18/2015

## 2015-06-18 NOTE — Progress Notes (Signed)
PULMONARY / CRITICAL CARE MEDICINE   Name: [name] [name] MRN: [id number] DOB: 1951-02-13    ADMISSION DATE:  06/09/2015 CONSULTATION DATE:  06/09/2015  REFERRING MD:  Vascular Surgery, Dr. Oneida Alar  CHIEF COMPLAINT:  Post op VDRF  SUBJECTIVE:  No issues. Stable off the vent.   VITAL SIGNS: BP 105/60 mmHg  Pulse 82  Temp(Src) 97.5 F (36.4 C) (Oral)  Resp 26  Ht 6' (1.829 m)  Wt 182 lb 3.2 oz (82.645 kg)  BMI 24.71 kg/m2  SpO2 100%  HEMODYNAMICS:    VENTILATOR SETTINGS:    INTAKE / OUTPUT: I/O last 3 completed shifts: In: 5611.2 [P.O.:240; I.V.:961.2; IV Piggyback:50] Out: 2659 [Other:2659]  PHYSICAL EXAMINATION: General: Awake, follows commands HEENT: Moist mucus membranes Cardiac:Irregular. No MRG Chest: No wheeze or crackles.  Abd: soft, decreased BS Ext: 1+ edema Neuro:No gross focal deficits.    LABS:  BMET  Recent Labs Lab 06/16/15 1600 06/17/15 0500 06/18/15 0430  NA 133* 135 129*  K 4.0 3.9 4.0  CL 96* 97* 93*  CO2 25 23 18*  BUN 52* 55* 114*  CREATININE 2.94* 3.00* 5.55*  GLUCOSE 186* 152* 148*    Electrolytes  Recent Labs Lab 06/16/15 0400 06/16/15 1600 06/17/15 0500 06/18/15 0430  CALCIUM 8.3* 8.8* 9.1 9.1  MG 2.4  --  2.4 2.5*  PHOS  --  1.8* 2.2* 5.8*    CBC  Recent Labs Lab 06/16/15 0400 06/17/15 0500 06/18/15 0500  WBC 8.4 11.8* 12.7*  HGB 9.9* 9.9* 9.1*  HCT 30.0* 29.7* 26.4*  PLT 65* 101* 123*    Coag's No results for input(s): APTT, INR in the last 168 hours.  Sepsis Markers  Recent Labs Lab 06/13/15 1020  06/15/15 0344 06/16/15 1055 06/18/15 0430  LATICACIDVEN 1.3  --   --   --   --   PROCALCITON 35.13  < > 22.99 8.92 7.03  < > = values in this interval not displayed.  ABG  Recent Labs Lab 06/14/15 0427 06/15/15 0341 06/15/15 1333  PHART 7.426 7.402 7.435  PCO2ART 38.5 41.9 37.0  PO2ART 80.0 109.0* 102.0*    Liver Enzymes  Recent Labs Lab 06/14/15 0435  06/16/15 0400 06/16/15 1600  06/17/15 0500 06/18/15 0430  AST 52*  --  32  --   --   --   ALT 73*  --  50  --   --   --   ALKPHOS 74  --  97  --   --   --   BILITOT 0.8  --  1.3*  --   --   --   ALBUMIN 1.8*  < > 1.8* 2.0* 2.0* 2.0*  < > = values in this interval not displayed.  Cardiac Enzymes  Recent Labs Lab 06/12/15 0942  TROPONINI 0.04*    Glucose  Recent Labs Lab 06/17/15 0510 06/17/15 1116 06/17/15 1823 06/17/15 2337 06/18/15 0606 06/18/15 0822  GLUCAP 157* 181* 153* 133* 149* 159*    Imaging Dg Abd Portable 1v  06/16/2015  CLINICAL DATA:  Absent bowel sounds question ileus EXAM: PORTABLE ABDOMEN - 1 VIEW COMPARISON:  Portable exam 1106 hours compared to 06/15/2015 FINDINGS: Surgical clips at midline post laparotomy. Tip of nasogastric tube projects over proximal stomach with proximal side-port at approximately the GE junction. Air-filled nondistended loops of bowel in the abdomen. No gross evidence of bowel wall thickening or obstruction. Bones unremarkable. IMPRESSION: Nonspecific bowel gas pattern. Electronically Signed   By: Crist Infante.D.  On: 06/16/2015 11:32     STUDIES:  1/21 CT abd >> Rt renal atrophy, 4 mm Lt renal calculus, increased size Lt para-aortic lesion, 3.3 cm AAA infrarenal 1/23 CT angio abd >> concern for enlarging pseudoaneurysm, stenosis of Rt renal artery and Lt renal artery 1/25 Abd aortogram >> occluded Rt renal artery  CULTURES: 2/10 Sputum >> Serratia  ANTIBIOTICS: 2/10 Vancomycin >> 2/10 Zosyn >> 2/13 2/13 ceftriaxone >>  SIGNIFICANT EVENTS: 2/6 to OR for elective abdominal aortic aneurysm repair, out on vent 2/7 extubated, HD cath placed 2/8 HD > afib with rvr, 1 L removed 2/10 VDRF  LINES/TUBES: 2/06 RIJ PA cath >>> 2/8 2/07 L IJ HC cath  >>>  2/10 ETT >> 2/10 Rt IJ CVL >>  DISCUSSION: 65 yo male with hx of AAA presented with abdominal pain from extension of aneurysm and Rt renal artery occlusion.  Post op course complicated by VDRF, AKI, and  A fib.  Developed recurrent respiratory failure with respiratory secretions 2/10 and required re-intubation.  ASSESSMENT / PLAN:  PULMONARY A: Acute hypoxic respiratory failure from pulmonary edema, pleural effusions and concern for HCAP. Wheezing noted 2/10 with hx of smoking. Age and debility will be impedance to extubation P:   Wean down O2 as toelrated Scheduled BDs Negative fluid balance as tolerated.  CARDIOVASCULAR A:  Abdominal aortic aneurysm s/p open repair 2/6. A fib with RVR with hx of permanent A fib >> rapid heart 2/10 might be related to respiratory distress >> improved after intubation. Acute on chronic combined CHF. Hx of CAD, HTN. Hypotension, resolved 2/11 , 2/12 with increased bp. Negative i/o with crrt P:  Amiodarone per cardiology Stable off pressors  RENAL A:   AKI due to ATN. Hyperkalemia. P:   CRRT transition to HD  GASTROINTESTINAL A:   Nutrition. Hx of GERD. Possible ileus. Abd pain . abd film 2/10 non acute P:   NPO >> TNA started 2/11 Protonix for SUP bowel regimen for consitpation Advancing diet.  HEMATOLOGIC A:   Anemia of critical illness. Thrombocytopenia. HIT ab negative.  P:  F/u CBC Continue lovenox for now for DVT prophylaxis  INFECTIOUS A:   Increased respiratory secretions 2/10 with concern for developing HCAP. Serratia in sputum P:   Abx narrowed to ceftriaxone. Continue for 7 days total abx. Stop date 2/16. Follow Procalcitonin.  ENDOCRINE A:   No acute issues. P:   Follow glucose on BMP  NEUROLOGIC A:   No issues P:    Attempted to contact family by phone >> no answer at listed numbers. 2/10 per Dr. Halford Chessman.  2/15 no family at bedside.  PCCM will sign off. Please call back with any questions.   Marshell Garfinkel MD [facility] Pager [phone] If no answer or after 3pm call: [phone] 06/18/2015, 9:38 AM

## 2015-06-18 NOTE — Progress Notes (Signed)
PARENTERAL NUTRITION CONSULT NOTE  Pharmacy Consult:  TPN Indication:  Prolonged ileus  No Known Allergies  Patient Measurements:  Height: 6' (182.9 cm) Weight: 182 lb 3.2 oz (82.645 kg) IBW/kg (Calculated) : 77.6  Vital Signs: Temp: 97.8 F (36.6 C) (02/15 0400) Temp Source: Oral (02/15 0707) BP: 142/61 mmHg (02/15 0700) Pulse Rate: 86 (02/15 0700) Intake/Output from previous day: 02/14 0701 - 02/15 0700 In: 3790.8 [P.O.:240; I.V.:640.8; IV Piggyback:50; TPN:2860] Out: 0  Intake/Output from this shift:    Labs:  Recent Labs  06/16/15 0400 06/17/15 0500 06/18/15 0500  WBC 8.4 11.8* 12.7*  HGB 9.9* 9.9* 9.1*  HCT 30.0* 29.7* 26.4*  PLT 65* 101* 123*     Recent Labs  06/16/15 0400 06/16/15 1600 06/17/15 0500 06/18/15 0430  NA 135 133* 135 129*  K 3.7 4.0 3.9 4.0  CL 97* 96* 97* 93*  CO2 24 25 23  18*  GLUCOSE 152* 186* 152* 148*  BUN 49* 52* 55* 114*  CREATININE 3.16* 2.94* 3.00* 5.55*  CALCIUM 8.3* 8.8* 9.1 9.1  MG 2.4  --  2.4 2.5*  PHOS  --  1.8* 2.2* 5.8*  PROT 5.9*  --   --   --   ALBUMIN 1.8* 2.0* 2.0* 2.0*  AST 32  --   --   --   ALT 50  --   --   --   ALKPHOS 97  --   --   --   BILITOT 1.3*  --   --   --   PREALBUMIN 11.2*  --   --   --   TRIG 266*  --   --   --    Estimated Creatinine Clearance: 15.1 mL/min (by C-G formula based on Cr of 5.55).    Recent Labs  06/17/15 1823 06/17/15 2337 06/18/15 0606  GLUCAP 153* 133* 149*    Insulin Requirements in the past 24 hours:  6 units SSI  Assessment: 37 YOM with history of occluded right renal artery discovered on 05/28/15 presented on 06/09/15 for AAA repair, left renal artery bypass and aortic endarterectomy.  Post-op course was complicated by respiratory failure requiring intubation and AKI requiring dialysis, currently on CRRT.  Pharmacy consulted to initiate TPN for prolonged ileus on 2/10.  GI: GERD - NG output is down to 136mL yesterday. Albumin low at 2.0, prealbumin low at 11.2.  Clear liquid diet started on 2/14. No charting on whether patient tolerating. PPI IV  Endo: No hx of DM. CBGs controlled (130-180s) TSH wnl  Lytes: Started on Clinimix E yesterday, had been w/o lytes. Lytes wnl exc Na down to 129 (no symptoms charted) K 4.0 (goal > 4) CoCa 10.6. Phos jumped to 5.8 today, Mg 2.5 (goal > 2) CaXphos = 61  Renal: AKI s/p HD x1 on 2/8, started on CRRT 2/9 (BL SCr 1.2) NS at 10 ml/hr, CRRT pulling 100 cc/hr. Nephro plan is to transition to iHD since he is more stable  Pulm: hx tobacco use, intubated 2/10, Xopenex/Atrovent nebs  Cards: ASCVD / HTN / Afib / PVD - neo drip added for BP support 2/10 PM, now off pressors, on amio gtt  Hepatobil: amylase elevated at 225, AST/ALT slightly elevated, tbili WNL; PMH HLD, Trig back down to 266 after being 449 on 2/11. Tbili 1.3.  AC/Heme: Coumadin for Afib stopped 2/1 PTA - concern with HIT, Hgb 9.5, plts 53  Neuro: hx CVA.  Fentanyl gtt   ID: Day #6 of abx for HCAP. Afebrile,  WBC up to 12.7  Zosyn 2/10 >> 2/13 Vanc 2/10 >> 2/13 Ceftriaxone 2/13 >>   Best Practices: Lovenox, MC  TPN Access: CVC triple lumen placed 06/13/15  TPN start date: 2/10 >>  Current Nutrition:  NPO TPN w/o lytes at 115 ml/hr, IVFE at 78ml/hr on MWF This provides 138 g of protein and 2166 kcal meeting 100% of protein needs and 100% of kcal needs  Nutritional Goals: per RD 2/13 KCal: 2100-2300 Protein: 135-145 g Fluid: per MD  Plan:  Change Clinimix back to 5/15 (w/o lytes) at 165m/hr tonight Continue 20% IVFE at 5ml/hr on MWF Continue IVMF at [facility] This provides a daily average of 138 g of protein and 2166 kcals meeting 100% of protein and 100% of kcal needs Continue MVI and trace elements in TPN Change to moderate SSI and adjust as needed Monitor TPN labs, renal labs, electrolytes closely  Consider replacing electrolytes outside of TPN as needed for now until electrolytes are more stable Plan is to get iHD today per Renal note  yesterday  Elenor Quinones, PharmD, BCPS Clinical Pharmacist Pager [phone] 06/18/2015 7:54 AM

## 2015-06-18 NOTE — Progress Notes (Signed)
Principal Problem:   Pseudoaneurysm of aorta (HCC) Active Problems:   Permanent atrial fibrillation (HCC)   CAD - s/p PCI to Cx. 100% RCA CTO   Acute on chronic kidney failure ([place]) - essentially anuric   Acute respiratory failure with hypoxemia (HCC)   Hyperlipidemia   Essential hypertension   Acute respiratory failure with hypoxia (HCC)   Ileus (HCC)   Testicular cancer ([place])   Respiratory failure (HCC)   Subjective:  More awake & alert + BM last PM - has food tray pending order to allow him to eat (wants to eat) No c/o CP or dyspnea. No palpitations. - rate controlled on Tele ? Plan is to convert to Intermittent HD from CVVHD  Objective:  Filed Vitals:   06/18/15 0800 06/18/15 0835 06/18/15 0849 06/18/15 1125  BP: 105/60  105/60   Pulse: 74 82 82   Temp:    97.3 F (36.3 C)  TempSrc:    Oral  Resp: 19 29 26    Height:      Weight:      SpO2: 100% 100% 100%     Intake/Output from previous day:  Intake/Output Summary (Last 24 hours) at 06/18/15 1127 Last data filed at 06/18/15 0800  Gross per 24 hour  Intake 3325.7 ml  Output      0 ml  Net 3325.7 ml    Physical Exam: Physical exam: Well-developed well-nourished; chronically ill appearing; no more anasarca Skin is warm and dry.  HEENT is normal.  Neck is supple. Bilateral IJ Lines - unable to assess JVP Chest with rhonchi and diminished BS bases Cardiovascular: Irreg,-Irreg & ~ rate controlled.  Abdominal exam s/p abd surgery; tender to palpation; hypoactive BS but present Extremities show 1+ edema.   Lab Results: Basic Metabolic Panel:  Recent Labs  06/17/15 0500 06/18/15 0430  NA 135 129*  K 3.9 4.0  CL 97* 93*  CO2 23 18*  GLUCOSE 152* 148*  BUN 55* 114*  CREATININE 3.00* 5.55*  CALCIUM 9.1 9.1  MG 2.4 2.5*  PHOS 2.2* 5.8*   CBC:  Recent Labs  06/16/15 0400 06/17/15 0500 06/18/15 0500  WBC 8.4 11.8* 12.7*  NEUTROABS 6.0  --   --   HGB 9.9* 9.9* 9.1*  HCT 30.0* 29.7* 26.4*    MCV 85.2 84.4 83.8  PLT 65* 101* 123*   Cardiac Enzymes: No results for input(s): CKTOTAL, CKMB, CKMBINDEX, TROPONINI in the last 72 hours.  Tele - stable Afib in 70s-90s   Assessment/Plan:  Principal Problem:   Pseudoaneurysm of aorta (HCC) - status post repair of abdominal aortic aneurysm/renal bypass-management per vascular surgery.  per Vasc Sgx Active Problems:   Permanent atrial fibrillation (HCC) - long-standing atrial fibrillation. HR improved (high normal) on IV Amiodarone;   continue amiodarone - convert to PO 400mg  PO BID once taking PO .   Restart home BB dose once taking PO   TSH normal; echo difficult but suggests EF 35-40, unchanged compared to previous.  Not currently on Anticoagulation (platelets stable & HITT appears to be normal) - will need to restart anticoagulation when safe per Vasc Sgx - if risk of bleed is excessive, would start Warfarin without bridge.  Defer timing of restarting warfarin based upon surgical stability & need for additional procedures. (discussed with Dr. Scot Dock yesterday)  Follow Hgb closely with recent ABL anemia - H/H slightly dropped   Only on Metoprolol @ home for rate control - can restart prior to d/c  CAD - s/p PCI to Cx. 100% RCA CTO - no active Angina; resume statin & BB on d/c; No need for Plavix   Acute respiratory failure with hypoxemia ([place]) - now extubated   Hyperlipidemia - restart statin when able.   Essential hypertension - antihypertensive meds have been on hold, restart BB once taking PO (amlodipine next), Hold ACE-I until OK by Nephrology -- BP has remained stable    Acute on chronic kidney failure ([place]) --The majority of his volume is likely secondary to acute renal failure. Dialysis is ongoing.  No further NSVT - keep K+ ~4 & Mg ~2, continue Amiodarone & restart BB when able. Thrombocytopenia - stable,HITT lab normal range, monitor closely.   [place], DAVID W 06/18/2015, 11:27 AM

## 2015-06-18 NOTE — Progress Notes (Signed)
PT Cancellation Note  Patient Details Name: [name] [name] MRN: [id number] DOB: [birth year]   Cancelled Treatment:    Reason Eval/Treat Not Completed: Patient at procedure or test/unavailable; patient at hemodialysis.  Will attempt to see tomorrow.   Reginia Naas 06/18/2015, 2:06 PM  Magda Kiel, [place] 06/18/2015

## 2015-06-18 NOTE — Progress Notes (Signed)
[facility] Progress Note    Assessment/ Plan:   1. AKI due to ATN: most likely in the setting of poor renal perfusion: a chronically occluded R kidney with a pseudoaneurysm on the left and significant hypovolemia/hypotension intra-operatively and post-operatively with an estimated blood loss of 4L leading to ATN.  Prior to 1/21, the patient's renal function was normal (Cr 1.21 on 04/09/15). Anuric. Had intermittent HD on 2/8, transitioned to CVVHD on 2/9.  Off CVVHD since 2/14.   - Will transition to intermittent HD given BPs stable over the last 72 hours.   2. Anion gap metabolic acidosis: most likely secondary to ARF,  This will resolve with HD.  3. Hypomagnesemia: Resolved, now 2.4.    4. Hyperkalemia: resolved with CVVHD.    5. Hypotension: Resolved. Off pressors since 12/12. No longer has a-line. 120s-140s/50-70s.   5. Atrial fibrillation: patient in Afib with RVR, still on an amio gtt.  Cardiology following. HRs 70s-90s currently.   6. Thrombocytopenia: Improving. Most likely secondary to surgery. HIT antibody negative.   Subjective:   Patient doing well. No SOB or chest pain. Still anuric. Had a BM and is passing flatus.    Objective:   BP 142/61 mmHg  Pulse 86  Temp(Src) 97.8 F (36.6 C) (Oral)  Resp 17  Ht 6' (1.829 m)  Wt 182 lb 3.2 oz (82.645 kg)  BMI 24.71 kg/m2  SpO2 97%  Intake/Output Summary (Last 24 hours) at 06/18/15 0821 Last data filed at 06/18/15 0700  Gross per 24 hour  Intake 3639.1 ml  Output      0 ml  Net 3639.1 ml   Weight change: 9 lb 9.3 oz (4.345 kg)    Physical Exam: General: Sitting up in bedside chair. More interactive than previously.  Left IJ catheter in place and c/d/i.  Cardiovascular: Irregularly irregular. No murmurs, rubs, or gallops noted.  Respiratory: No increased WOB. Lungs CTAB on RA.   Abdomen: Normal bowel sounds, soft, non-distended. Some tenderness at the incision site, no rebound or guarding.  MSK:  Trace pitting  edema noted in the pre-sacral region.    Imaging: Dg Abd Portable 1v  06/16/2015  CLINICAL DATA:  Absent bowel sounds question ileus EXAM: PORTABLE ABDOMEN - 1 VIEW COMPARISON:  Portable exam 1106 hours compared to 06/15/2015 FINDINGS: Surgical clips at midline post laparotomy. Tip of nasogastric tube projects over proximal stomach with proximal side-port at approximately the GE junction. Air-filled nondistended loops of bowel in the abdomen. No gross evidence of bowel wall thickening or obstruction. Bones unremarkable. IMPRESSION: Nonspecific bowel gas pattern. Electronically Signed   By: Lavonia Dana M.D.   On: 06/16/2015 11:32    Labs: BMET  Recent Labs Lab 06/14/15 0435  06/14/15 1646 06/15/15 0344 06/15/15 1528 06/16/15 0400 06/16/15 1600 06/17/15 0500 06/18/15 0430  NA 138  < > 136 137 135 135 133* 135 129*  K 4.6  < > 4.3 3.9 4.1 3.7 4.0 3.9 4.0  CL 101  < > 97* 101 99* 97* 96* 97* 93*  CO2 25  < > 25 25 23 24 25 23  18*  GLUCOSE 150*  < > 139* 125* 148* 152* 186* 152* 148*  BUN 36*  < > 34* 39* 43* 49* 52* 55* 114*  CREATININE 3.38*  < > 3.30* 3.36* 2.99* 3.16* 2.94* 3.00* 5.55*  CALCIUM 8.4*  < > 8.5* 8.5* 8.6* 8.3* 8.8* 9.1 9.1  PHOS 4.2  --  3.6 4.2 2.8  --  1.8* 2.2* 5.8*  < > = values in this interval not displayed. CBC  Recent Labs Lab 06/14/15 0435 06/15/15 0344 06/16/15 0400 06/17/15 0500 06/18/15 0500  WBC 9.5 10.4 8.4 11.8* 12.7*  NEUTROABS 7.6  --  6.0  --   --   HGB 9.1* 9.5* 9.9* 9.9* 9.1*  HCT 27.8* 28.6* 30.0* 29.7* 26.4*  MCV 87.4 85.9 85.2 84.4 83.8  PLT 49* 53* 65* 101* 123*    Medications:    . acetylcysteine  4 mL Nebulization BID  . antiseptic oral rinse  7 mL Mouth Rinse q12n4p  . cefTRIAXone (ROCEPHIN)  IV  1 g Intravenous Q24H  . chlorhexidine  15 mL Mouth Rinse BID  . enoxaparin (LOVENOX) injection  30 mg Subcutaneous Q24H  . insulin aspart  0-15 Units Subcutaneous 6 times per day  . ipratropium  0.5 mg Nebulization  Q6H  . levalbuterol  0.63 mg Nebulization Q6H  . pantoprazole (PROTONIX) IV  40 mg Intravenous Q24H  . sodium chloride flush  10-40 mL Intracatheter Q12H      Kathrine Cords, MD [facility] Family Medicine Resident, PGY-2 06/18/2015, 8:21 AM    Renal Attending: I agree with above. Will continue to support with IHD due to anuric AKI. Zayra Devito C

## 2015-06-18 NOTE — Progress Notes (Addendum)
[facility] [place]  Subjective  - feels weak but overall better, some nausea yesterday but seems to be tolerating clear liquids   Objective 142/61 86 97.8 F (36.6 C) (Oral) 17 97%  Intake/Output Summary (Last 24 hours) at 06/18/15 0842 Last data filed at 06/18/15 0700  Gross per 24 hour  Intake 3639.1 ml  Output      0 ml  Net 3639.1 ml   Abdomen incision healing Feet pink warm  Assessment/Planning: Go slowly with diet until more gut function but will advance to full liquids today. Ambulate some if possible will most likely need rehab Renal failure per Neph intermittent dialysis.  Need to consider tunnelled cath probably early next week if no return of renal function. Will check renal duplex as this may add some info to prognosis of return of renal function if graft is occluded Afib will start heparin after platelets normalize Will need coumadin long term after up to speed on diet, rate control per cardiology    Ruta Hinds 06/18/2015 8:42 AM --  Laboratory Lab Results:  Recent Labs  06/17/15 0500 06/18/15 0500  WBC 11.8* 12.7*  HGB 9.9* 9.1*  HCT 29.7* 26.4*  PLT 101* 123*   BMET  Recent Labs  06/17/15 0500 06/18/15 0430  NA 135 129*  K 3.9 4.0  CL 97* 93*  CO2 23 18*  GLUCOSE 152* 148*  BUN 55* 114*  CREATININE 3.00* 5.55*  CALCIUM 9.1 9.1    COAG Lab Results  Component Value Date   INR 1.39 06/10/2015   INR 1.40 06/09/2015   INR 1.07 06/09/2015   No results found for: PTT

## 2015-06-18 NOTE — Progress Notes (Addendum)
*  PRELIMINARY RESULTS* Vascular Ultrasound Renal Artery Duplex has been completed.  Preliminary findings: Difficult evaluation due to overlying bowel gas. Right kidney is asymmetrically smaller than left. Right measures 8.58cm. Left measures 12.4cm. Could not clearly visualized proximal right renal artery, however dampened flow is noted in the mid and distal artery as well as intrarenal flow. This suggests a proximal obstruction on the right. Appears to be patent left renal artery bypass and intrarenal flow. However, waveforms appear high resistance. Etiology unknown.   Landry Mellow, RDMS, RVT  06/18/2015, 11:13 AM

## 2015-06-18 NOTE — Progress Notes (Signed)
OT Cancellation Note  Patient Details Name: [name] [name] MRN: [id number] DOB: Jul 19, 1950   Cancelled Treatment:    Reason Eval/Treat Not Completed: Patient at procedure or test/ unavailable (HD)  [name] 06/18/2015, 1:50 PM

## 2015-06-18 NOTE — Progress Notes (Signed)
UR Completed. Hisayo Delossantos, RN, BSN.  [phone] 

## 2015-06-18 NOTE — Progress Notes (Signed)
Called Dr. Oneida Alar to assess appropriateness of patient having 1st hemodialysis after CRRT(completed on 2/14) in the hemodialysis unit. After notifying MD of the following: SBP 105-133, Afib 80's, RR 15-25, sats 95-99 on RA. Dr. Oneida Alar gave permission for patient to travel to HD unit for hemodialysis.

## 2015-06-19 ENCOUNTER — Encounter (HOSPITAL_COMMUNITY): Payer: Self-pay | Admitting: Physician Assistant

## 2015-06-19 DIAGNOSIS — I5043 Acute on chronic combined systolic (congestive) and diastolic (congestive) heart failure: Secondary | ICD-10-CM

## 2015-06-19 LAB — MAGNESIUM: Magnesium: 2 mg/dL (ref 1.7–2.4)

## 2015-06-19 LAB — COMPREHENSIVE METABOLIC PANEL
ALT: 37 U/L (ref 17–63)
AST: 23 U/L (ref 15–41)
Albumin: 1.9 g/dL — ABNORMAL LOW (ref 3.5–5.0)
Alkaline Phosphatase: 108 U/L (ref 38–126)
Anion gap: 14 (ref 5–15)
BUN: 82 mg/dL — ABNORMAL HIGH (ref 6–20)
CO2: 22 mmol/L (ref 22–32)
Calcium: 8.6 mg/dL — ABNORMAL LOW (ref 8.9–10.3)
Chloride: 96 mmol/L — ABNORMAL LOW (ref 101–111)
Creatinine, Ser: 4.79 mg/dL — ABNORMAL HIGH (ref 0.61–1.24)
GFR calc Af Amer: 14 mL/min — ABNORMAL LOW (ref >60)
GFR calc non Af Amer: 12 mL/min — ABNORMAL LOW (ref >60)
Glucose, Bld: 151 mg/dL — ABNORMAL HIGH (ref 65–99)
Potassium: 3.7 mmol/L (ref 3.5–5.1)
Sodium: 132 mmol/L — ABNORMAL LOW (ref 135–145)
Total Bilirubin: 0.5 mg/dL (ref 0.3–1.2)
Total Protein: 5.5 g/dL — ABNORMAL LOW (ref 6.5–8.1)

## 2015-06-19 LAB — PHOSPHORUS: Phosphorus: 4.7 mg/dL — ABNORMAL HIGH (ref 2.5–4.6)

## 2015-06-19 LAB — GLUCOSE, CAPILLARY
Glucose-Capillary: 145 mg/dL — ABNORMAL HIGH (ref 65–99)
Glucose-Capillary: 138 mg/dL — ABNORMAL HIGH (ref 65–99)
Glucose-Capillary: 135 mg/dL — ABNORMAL HIGH (ref 65–99)
Glucose-Capillary: 128 mg/dL — ABNORMAL HIGH (ref 65–99)
Glucose-Capillary: 133 mg/dL — ABNORMAL HIGH (ref 65–99)

## 2015-06-19 LAB — CBC
HCT: 23.9 % — ABNORMAL LOW (ref 39.0–52.0)
Hemoglobin: 8 g/dL — ABNORMAL LOW (ref 13.0–17.0)
MCH: 27.7 pg (ref 26.0–34.0)
MCHC: 33.5 g/dL (ref 30.0–36.0)
MCV: 82.7 fL (ref 78.0–100.0)
Platelets: 152 10*3/uL (ref 150–400)
RBC: 2.89 MIL/uL — ABNORMAL LOW (ref 4.22–5.81)
RDW: 16.8 % — ABNORMAL HIGH (ref 11.5–15.5)
WBC: 12.9 10*3/uL — ABNORMAL HIGH (ref 4.0–10.5)

## 2015-06-19 LAB — HEPATITIS B CORE ANTIBODY, TOTAL: Hep B Core Total Ab: NEGATIVE

## 2015-06-19 LAB — HEPATITIS B SURFACE ANTIBODY,QUALITATIVE: Hep B S Ab: NONREACTIVE

## 2015-06-19 MED ORDER — POTASSIUM CHLORIDE 10 MEQ/50ML IV SOLN
10.0000 meq | INTRAVENOUS | Status: AC
  Administered 2015-06-19 (×2): 10 meq via INTRAVENOUS
  Filled 2015-06-19 (×2): qty 50

## 2015-06-19 MED ORDER — LEVALBUTEROL HCL 0.63 MG/3ML IN NEBU
0.6300 mg | INHALATION_SOLUTION | Freq: Four times a day (QID) | RESPIRATORY_TRACT | Status: DC | PRN
  Filled 2015-06-19: qty 3

## 2015-06-19 MED ORDER — TRACE MINERALS CR-CU-MN-SE-ZN 10-1000-500-60 MCG/ML IV SOLN
INTRAVENOUS | Status: DC
  Administered 2015-06-19: 18:00:00 via INTRAVENOUS
  Filled 2015-06-19: qty 2760

## 2015-06-19 MED ORDER — METOPROLOL TARTRATE 1 MG/ML IV SOLN
2.5000 mg | Freq: Four times a day (QID) | INTRAVENOUS | Status: DC
  Administered 2015-06-19 – 2015-06-20 (×4): 2.5 mg via INTRAVENOUS
  Filled 2015-06-19 (×4): qty 5

## 2015-06-19 MED ORDER — IPRATROPIUM BROMIDE 0.02 % IN SOLN: 0.5000 mg | Freq: Four times a day (QID) | RESPIRATORY_TRACT | Status: DC | PRN

## 2015-06-19 NOTE — Progress Notes (Addendum)
[facility] Progress Note  Subjective  - POD #10  Had 3 BMs yesterday. Feeling nauseous and vomited. Denies chest pain and shortness of breath.   Objective Filed Vitals:   06/19/15 0600 06/19/15 0700  BP: 136/66 138/75  Pulse: 88 76  Temp:  97.6 F (36.4 C)  Resp: 20 22   Tmax 98.3 97% RA  Intake/Output Summary (Last 24 hours) at 06/19/15 0736 Last data filed at 06/19/15 0700  Gross per 24 hour  Intake 2913.3 ml  Output   2100 ml  Net  813.3 ml    General: Resting in bed in NAD Lungs: decreased breath sounds at bases, some rhonchi CV: irregularly irregular Abdomen: normoactive bowel sounds, no distension, no tenderness to palpation Incisions: Midline incision clean and intact Extremities: Feet warm and pink. Palpable right DP.   Assessment/Planning: 65 y.o. male is s/p: Repair of juxtarenal abdominal aortic aneurysm, left renal artery bypass  10 Days Post-Op   AKI: anuric. Creatinine trending down. Tolerated intermittent HD yesterday. Renal duplex suggesting patent left renal bypass. Dampened flow in right renal artery.  Afib: rate controlled. On IV amiodarone. Hold on anticoagulation for now in case dialysis catheter placement is necessary next week. Will ask Dr. Oneida Alar about heparin.  Check CBC this am.  Still having nausea. Will continue to go slow with full liquids.  Encourage incentive spirometry, flutter valve. OOB into chair. Mobilize. Keep in ICU one more day. Possibly transfer out of unit tomorrow.   Alvia Grove 06/19/2015 7:36 AM -- Agree with above.  If continued nausea will check abdominal xray.  Stay on fulls for now.  Ambulate in hall if possible.  Convert amio to PO if fulls stay down.  Most likely diatek next week.  Hold on heparin until platelet count normal  Abdomen incision continues to heal POD 9.  Will d/c staples next week no rush on this with current severe protein calorie malnutrition.  Patent renal bypass only time  will tell if any recovery of function PRN dialysis per Nephrology.  Ruta Hinds, MD [facility] [place] Office: [phone] Pager: [phone]  Laboratory CBC    Component Value Date/Time   WBC 12.7* 06/18/2015 0500   HGB 9.1* 06/18/2015 0500   HCT 26.4* 06/18/2015 0500   PLT 123* 06/18/2015 0500    BMET    Component Value Date/Time   NA 132* 06/19/2015 0341   K 3.7 06/19/2015 0341   CL 96* 06/19/2015 0341   CO2 22 06/19/2015 0341   GLUCOSE 151* 06/19/2015 0341   BUN 82* 06/19/2015 0341   CREATININE 4.79* 06/19/2015 0341   CREATININE 1.10 11/07/2014 0804   CALCIUM 8.6* 06/19/2015 0341   GFRNONAA 12* 06/19/2015 0341   GFRNONAA 53* 05/14/2013 0905   GFRAA 14* 06/19/2015 0341   GFRAA 62 05/14/2013 0905    COAG Lab Results  Component Value Date   INR 1.39 06/10/2015   INR 1.40 06/09/2015   INR 1.07 06/09/2015   No results found for: PTT  Antibiotics Anti-infectives    Start     Dose/Rate Route Frequency Ordered Stop   06/16/15 1300  cefTRIAXone (ROCEPHIN) 1 g in dextrose 5 % 50 mL IVPB     1 g 100 mL/hr over 30 Minutes Intravenous Every 24 hours 06/16/15 1154 06/19/15 2359   06/14/15 1200  vancomycin (VANCOCIN) IVPB 1000 mg/200 mL premix  Status:  Discontinued     1,000 mg 200 mL/hr over 60 Minutes Intravenous Every 24  hours 06/13/15 1014 06/15/15 1101   06/13/15 1200  piperacillin-tazobactam (ZOSYN) IVPB 3.375 g  Status:  Discontinued     3.375 g 100 mL/hr over 30 Minutes Intravenous 4 times per day 06/13/15 1014 06/16/15 1154   06/13/15 1015  vancomycin (VANCOCIN) 1,500 mg in sodium chloride 0.9 % 250 mL IVPB     1,500 mg 250 mL/hr over 60 Minutes Intravenous  Once 06/13/15 1014 06/13/15 1143   06/09/15 2000  cefUROXime (ZINACEF) 1.5 g in dextrose 5 % 50 mL IVPB     1.5 g 100 mL/hr over 30 Minutes Intravenous Every 12 hours 06/09/15 1522 06/10/15 0855   06/09/15 1130  cefUROXime (ZINACEF) 1.5 g in dextrose 5 % 50 mL IVPB   Status:  Discontinued     1.5 g 100 mL/hr over 30 Minutes Intravenous To Surgery 06/09/15 1123 06/09/15 1509   06/09/15 0700  cefUROXime (ZINACEF) 1.5 g in dextrose 5 % 50 mL IVPB     1.5 g 100 mL/hr over 30 Minutes Intravenous To ShortStay Surgical 06/08/15 1434 06/09/15 [place], PA-C [facility] Office: [phone] Pager: [phone] 06/19/2015 7:36 AM

## 2015-06-19 NOTE — Progress Notes (Signed)
PARENTERAL NUTRITION CONSULT NOTE  Pharmacy Consult:  TPN Indication:  Prolonged ileus  No Known Allergies  Patient Measurements:  Height: 6' (182.9 cm) Weight: 182 lb 15.7 oz (83 kg) IBW/kg (Calculated) : 77.6  Vital Signs: Temp: 97.6 F (36.4 C) (02/16 0700) Temp Source: Oral (02/16 0355) BP: 138/75 mmHg (02/16 0700) Pulse Rate: 76 (02/16 0700) Intake/Output from previous day: 02/15 0701 - 02/16 0700 In: 2913.3 [I.V.:430.8; IV Piggyback:50; TPN:2432.5] Out: 2100  Intake/Output from this shift:    Labs:  Recent Labs  06/17/15 0500 06/18/15 0500  WBC 11.8* 12.7*  HGB 9.9* 9.1*  HCT 29.7* 26.4*  PLT 101* 123*     Recent Labs  06/17/15 0500 06/18/15 0430 06/19/15 0341  NA 135 129* 132*  K 3.9 4.0 3.7  CL 97* 93* 96*  CO2 23 18* 22  GLUCOSE 152* 148* 151*  BUN 55* 114* 82*  CREATININE 3.00* 5.55* 4.79*  CALCIUM 9.1 9.1 8.6*  MG 2.4 2.5* 2.0  PHOS 2.2* 5.8* 4.7*  PROT  --   --  5.5*  ALBUMIN 2.0* 2.0* 1.9*  AST  --   --  23  ALT  --   --  37  ALKPHOS  --   --  108  BILITOT  --   --  0.5   Estimated Creatinine Clearance: 17.6 mL/min (by C-G formula based on Cr of 4.79).    Recent Labs  06/18/15 1908 06/18/15 2330 06/19/15 0335  GLUCAP 185* 164* 145*    Insulin Requirements in the past 24 hours:  14 units SSI  Assessment: 25 YOM with history of occluded right renal artery discovered on 05/28/15 presented on 06/09/15 for AAA repair, left renal artery bypass and aortic endarterectomy.  Post-op course was complicated by respiratory failure requiring intubation and AKI requiring dialysis, currently on CRRT.  Pharmacy consulted to initiate TPN for prolonged ileus on 2/10.  GI: GERD - Albumin low at 1.9, prealbumin low at 11.2. Advanced to full liquids but appetite is poor. Also experiencing nausea and vomiting so will not advance for now. PPI IV  Endo: No hx of DM. CBGs controlled (140-180s) TSH wnl  Lytes: Had been w/o lytes in Clinimix due to CRRT.  Given Clinimix E on 2/14, but then had to be switched back to w/o lytes yesterday. Have alternated w/ and w/o lytes past 3 days now. Lytes wnl exc Na 132 (no symptoms charted) K 3.7 (goal > 4) CoCa 10.2. Phos down to 4.7, Mg 2.0 (goal > 2) CaXphos = 48  Renal: AKI s/p HD x1 on 2/8, started on CRRT 2/9 (BL SCr 1.2) CRRT was pulling 100 cc/hr. Now Nephro to switch to iHD on 2/15. Last HD session was 2/15 with a BFR of 267ml/min x 3.5 hrs  Pulm: hx tobacco use, intubated 2/10, Xopenex/Atrovent nebs  Cards: ASCVD / HTN / Afib / PVD - neo drip off, on amio gtt  Hepatobil: amylase elevated at 225, AST/ALT slightly elevated, tbili WNL; PMH HLD, Trig back down to 266 after being 449 on 2/11. Tbili 1.3.  AC/Heme: Coumadin for Afib stopped 2/1 PTA - concern with HIT, Hgb 9.5, plts 123  Neuro: hx CVA.  Fentanyl gtt   ID: Day #7 of abx for HCAP. Afebrile, WBC up to 12.7  Zosyn 2/10 >> 2/13 Vanc 2/10 >> 2/13 Ceftriaxone 2/13 >>   Best Practices: Lovenox, MC  TPN Access: CVC triple lumen placed 06/13/15  TPN start date: 2/10 >>  Current Nutrition:  Full liquid  diet (appetite poor) TPN w/o lytes at 115 ml/hr, IVFE at 54ml/hr on MWF This provides 138 g of protein and 2166 kcal meeting 100% of protein needs and 100% of kcal needs  Nutritional Goals: per RD 2/13 KCal: 2100-2300 Protein: 135-145 g Fluid: per MD  Plan:  Change to Clinimix E 5/15 at 150m/hr tonight Continue 20% IVFE at 15ml/hr on MWF Continue IVMF at [facility] This provides a daily average of 138 g of protein and 2166 kcals meeting 100% of protein and 100% of kcal needs Continue MVI and trace elements in TPN Continue moderate SSI and adjust as neede d Monitor TPN labs, daily renal labs  Give 97mEq of KCl x 2 runs  Elenor Quinones, PharmD, [facility] Clinical Pharmacist Pager [phone] 06/19/2015 7:54 AM

## 2015-06-19 NOTE — Progress Notes (Signed)
Physical Therapy Treatment Patient Details Name: [name] [name] MRN: [id number] DOB: 1950-08-06 Today's Date: 06/19/2015    History of Present Illness 65 yo Male with PMH as below, which is significant HTN, Atrial Fib on warfarin, CVA, and known AAA. He was recently admitted to [facility] ED 1/20 with complaints of abdominal pain. CT abdomen at that time showed evidence of hypoperfusion to the R kidney. CT angio showed some enlargement of the seat of an aneurysm. 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 It was on that day that he presented, and underwent the procedures.     PT Comments    Patient making slow progress with mobility.  Follow Up Recommendations  SNF;Supervision/Assistance - 24 hour     Equipment Recommendations  None recommended by PT    Recommendations for Other Services       Precautions / Restrictions Precautions Precautions: Fall Precaution Comments: CVVHD transitioned to HD on 06/17/15 Restrictions Weight Bearing Restrictions: No    Mobility  Bed Mobility Overal bed mobility: Needs Assistance Bed Mobility: Supine to Sit;Sit to Supine     Supine to sit: Mod assist Sit to supine: Min assist   General bed mobility comments: Verbal cues for technique.  Assist to bring trunk to upright position.  Patient able to sit EOB with good balance.  Patient reports feeling lightheaded in sitting.  BP 114/63 - minimal change from BP in supine.  Patient with good balance in sitting.  Transfers Overall transfer level: Needs assistance Equipment used:  None Transfers: Sit to/from [facility] Sit to Stand: Mod assist;+2 safety/equipment Stand pivot transfers: Min assist;+2 safety/equipment       General transfer comment: Verbal cues for technique.  Assist to power up to standing.  Patient able to take several shuffle steps to pivot to chair.  Assist for balance in standing.  Ambulation/Gait                 Stairs            Wheelchair Mobility    Modified Rankin (Stroke Patients Only)       Balance           Standing balance support: Single extremity supported Standing balance-Leahy Scale: Poor                      Cognition Arousal/Alertness: Awake/alert Behavior During Therapy: Flat affect;Impulsive Overall Cognitive Status: Within Functional Limits for tasks assessed                      Exercises      General Comments        Pertinent Vitals/Pain Pain Assessment: 0-10 Pain Score: 4  Pain Location: Abdomen Pain Descriptors / Indicators: Operative site guarding;Sore Pain Intervention(s): Monitored during session;Repositioned    Home Living                      Prior Function            PT Goals (current goals can now be found in the care plan section) Progress  towards PT goals: Progressing toward goals    Frequency  Min 3X/week    PT Plan Current plan remains appropriate    Co-evaluation             End of Session Equipment Utilized During Treatment: Gait belt Activity Tolerance: Patient limited by fatigue Patient left: in chair;with call bell/phone within reach;with chair alarm set     Time: 1422-1436 PT Time Calculation (min) (ACUTE ONLY): 14 min  Charges:  $Therapeutic Activity: 8-22 mins                    G Codes:      Despina Pole 07/04/15, 3:12 PM Carita Pian. Sanjuana Kava, [name] Pager [phone]

## 2015-06-19 NOTE — Progress Notes (Signed)
Assessment/ Plan:   1. 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 S/p CVVHD Will need to convert HD to [facility] Next HD in AM      Subjective: Interval History: Tolerated HD yesterday  Objective: Vital signs in last 24 hours: Temp:  [97.3 F (36.3 C)-98.3 F (36.8 C)] 97.3 F (36.3 C) (02/16 1100) Pulse Rate:  [76-99] 90 (02/16 0900) Resp:  [13-25] 15 (02/16 0900) BP: (97-153)/(43-75) 153/67 mmHg (02/16 0900) SpO2:  [94 %-99 %] 97 % (02/16 0900) Weight:  [82.2 kg (181 lb 3.5 oz)-83.6 kg (184 lb 4.9 oz)] 83 kg (182 lb 15.7 oz) (02/16 0600) Weight change: 0.955 kg (2 lb 1.7 oz)  Intake/Output from previous day: 02/15 0701 - 02/16 0700 In: 2913.3 [I.V.:430.8; IV Piggyback:50; TPN:2432.5] Out: 2100  Intake/Output this shift: Total I/O In: 283.4 [I.V.:33.4; TPN:250] Out: -   General appearance: alert and cooperative Resp: clear to auscultation bilaterally Extremities: edema tr  Lab Results:  Recent Labs  06/18/15 0500 06/19/15 0803  WBC 12.7* 12.9*  HGB 9.1* 8.0*  HCT 26.4* 23.9*  PLT 123* 152   BMET:  Recent Labs  06/18/15 0430 06/19/15 0341  NA 129* 132*  K 4.0 3.7  CL 93* 96*  CO2 18* 22  GLUCOSE 148* 151*  BUN 114* 82*  CREATININE 5.55* 4.79*  CALCIUM 9.1 8.6*   No results for input(s): PTH in the last 72 hours. Iron Studies: No results for input(s): IRON, TIBC, TRANSFERRIN, FERRITIN in the last 72 hours. Studies/Results: No results found.  Scheduled: . alteplase  2 mg Intracatheter Once  . antiseptic oral rinse  7 mL Mouth Rinse q12n4p  . cefTRIAXone (ROCEPHIN)  IV  1 g Intravenous Q24H  . chlorhexidine  15 mL Mouth Rinse BID  . enoxaparin (LOVENOX) injection  30 mg Subcutaneous Q24H  . insulin aspart  0-15 Units Subcutaneous 6 times per day   . metoprolol  2.5 mg Intravenous 4 times per day  . pantoprazole (PROTONIX) IV  40 mg Intravenous Q24H  . sodium chloride flush  10-40 mL Intracatheter Q12H    LOS: 10 days   [name] [name] C 06/19/2015,12:07 PM

## 2015-06-19 NOTE — Progress Notes (Signed)
I met with pt briefly at bedside. C/o of nausea and diarhea. I will contact his wife to begin discussions of potential need for rehab once medially ready. Noted AKI requiring intermittent Hemodialysis. [facility] Medicare will have to approve any rehab venue. Pt states his wife works 2 days and off 3 and his daughter currently unemployed. [phone]

## 2015-06-19 NOTE — Progress Notes (Signed)
Patient: [name] [name] / Admit Date: 06/09/2015 / Date of Encounter: 06/19/2015, 9:25 AM   Subjective: C/o nausea. No CP or SOB.   Objective: Telemetry: atrial fib, rates controlled. Brief bradycardia overnight (50bpm) Physical Exam: Blood pressure 153/67, pulse 90, temperature 97.6 F (36.4 C), temperature source Oral, resp. rate 15, height 6' (1.829 m), weight 182 lb 15.7 oz (83 kg), SpO2 97 %. General: Well developed, well nourished WM, in no acute distress.Lying flat in bed Head: Normocephalic, atraumatic, sclera non-icteric, no xanthomas, nares are without discharge. Neck: JVP not elevated. Lungs: Clear bilaterally to auscultation without wheezes, rales, or rhonchi. Breathing is unlabored. Heart: Irregularly irregular, rate controlled, without murmurs, rubs, or gallops.  Abdomen: Soft, non-tender, non-distended with normoactive bowel sounds. No rebound/guarding. Extremities: No clubbing or cyanosis. No edema. Distal pedal pulses are 2+ and equal bilaterally. Neuro: Alert and oriented X 3. Moves all extremities spontaneously. Psych:  Responds to questions appropriately with a normal affect.   Intake/Output Summary (Last 24 hours) at 06/19/15 0925 Last data filed at 06/19/15 0900  Gross per 24 hour  Intake   3055 ml  Output   2100 ml  Net    955 ml    Inpatient Medications:  . alteplase  2 mg Intracatheter Once  . antiseptic oral rinse  7 mL Mouth Rinse q12n4p  . cefTRIAXone (ROCEPHIN)  IV  1 g Intravenous Q24H  . chlorhexidine  15 mL Mouth Rinse BID  . enoxaparin (LOVENOX) injection  30 mg Subcutaneous Q24H  . insulin aspart  0-15 Units Subcutaneous 6 times per day  . pantoprazole (PROTONIX) IV  40 mg Intravenous Q24H  . potassium chloride  10 mEq Intravenous Q1 Hr x 2  . sodium chloride flush  10-40 mL Intracatheter Q12H   Infusions:  . Marland KitchenTPN (CLINIMIX-E) Adult    . sodium chloride Stopped (06/18/15 1300)  . amiodarone 30 mg/hr (06/19/15 0400)  . TPN (CLINIMIX) Adult  without lytes 115 mL/hr at 06/18/15 1815   And  . fat emulsion 240 mL (06/18/15 1815)  . phenylephrine (NEO-SYNEPHRINE) Adult infusion Stopped (06/14/15 1100)    Labs:  Recent Labs  06/18/15 0430 06/19/15 0341  NA 129* 132*  K 4.0 3.7  CL 93* 96*  CO2 18* 22  GLUCOSE 148* 151*  BUN 114* 82*  CREATININE 5.55* 4.79*  CALCIUM 9.1 8.6*  MG 2.5* 2.0  PHOS 5.8* 4.7*    Recent Labs  06/18/15 0430 06/19/15 0341  AST  --  23  ALT  --  37  ALKPHOS  --  108  BILITOT  --  0.5  PROT  --  5.5*  ALBUMIN 2.0* 1.9*    Recent Labs  06/18/15 0500 06/19/15 0803  WBC 12.7* 12.9*  HGB 9.1* 8.0*  HCT 26.4* 23.9*  MCV 83.8 82.7  PLT 123* 152   No results for input(s): CKTOTAL, CKMB, TROPONINI in the last 72 hours. Invalid input(s): POCBNP No results for input(s): HGBA1C in the last 72 hours.   Radiology/Studies:  Dg Chest 2 View  05/24/2015  CLINICAL DATA:  65 year old male with nausea and vomiting and sharp right lower quadrant abdominal pain EXAM: CHEST  2 VIEW COMPARISON:  Radiograph dated 04/09/2015 FINDINGS: The heart size and mediastinal contours are within normal limits. Both lungs are clear. The visualized skeletal structures are unremarkable. IMPRESSION: No active cardiopulmonary disease. Electronically Signed   By: Anner Crete M.D.   On: 05/24/2015 04:45   Dg Abd 1 View  06/15/2015  CLINICAL  DATA:  Abdominal tenderness, decreased bowel sounds. No recent bowel movements. Concern for ileus. EXAM: ABDOMEN - 1 VIEW COMPARISON:  Abdomen plain film dated 06/13/2015. FINDINGS: Abdominal wall surgical staples again noted. Enteric tube in the stomach. There is a paucity of bowel gas. Overall bowel gas pattern is nonobstructive. No evidence of free intraperitoneal air seen. No evidence of abnormal fluid collection. IMPRESSION: No radiographic evidence of bowel obstruction or ileus, although a paucity of bowel gas limits characterization. Enteric tube tip in the stomach, but with  proximal side holes at or just below the level of the gastroesophageal junction. Would consider advancing for optimal radiographic positioning. Electronically Signed   By: Franki Cabot M.D.   On: 06/15/2015 12:45   Ct Angio Abdomen W/cm &/or Wo Contrast  05/26/2015  CLINICAL DATA:  Right flank pain since colonoscopy on 05/22/2015. Evaluate aortic lesion. EXAM: CT ANGIOGRAPHY ABDOMEN TECHNIQUE: Multidetector CT imaging of the abdomen was performed using the standard protocol during bolus administration of intravenous contrast. Multiplanar reconstructed images including MIPs were obtained and reviewed to evaluate the vascular anatomy. CONTRAST:  119mL OMNIPAQUE IOHEXOL 350 MG/ML SOLN COMPARISON:  05/24/2015 and 12/19/2007 FINDINGS: ARTERIAL FINDINGS: Aorta: Diffuse atherosclerotic disease in the abdominal aorta with an exophytic saccular low-density structure along the left infrarenal aspect. This structure has peripheral calcifications and no significant arterial flow within it. The saccular exophytic portion measures 4.1 x 3.8 x 3.8 cm. Overall size of the aorta with the aneurysm measures up to 6.0 cm. Exophytic portion measured 3.2 x 2.5 x 2.0 cm in 2009. The distal abdominal aorta near the IMA takeoff measures up to 3.2 cm in the AP dimension, measured 2.9 cm in 2009. Irregularity along the posterior aspect of the abdominal aorta on sequence 5, image 47 may represent the underlying arterial lesion or defect in the aorta. Celiac axis: Again noted is moderate to severe stenosis at the origin of the celiac trunk. Prominent pancreatic-duodenal branches and suspect retrograde flow in this area related to the stenosis in the celiac trunk. Superior mesenteric: SMA is patent with mild atherosclerotic disease. Left renal: There appears to be high-grade stenosis involving the proximal left renal artery which may be related to compression from the adjacent aortic lesion. Right renal: Near occlusion of the proximal right  renal artery and there is delayed enhancement of the right kidney compared to the left kidney. Inferior mesenteric:  Patent Left iliac: Atherosclerotic plaque in the proximal left common iliac artery with at least mild stenosis. Right iliac: Atherosclerotic plaque in the proximal right iliac arteries. At least mild stenosis at the origin of the right internal iliac artery. Venous findings: IVC and renal veins are patent. Portal venous system is patent. Review of the MIP images confirms the above findings. Nonvascular findings: Mild dependent atelectasis or scarring at the lung bases. No pleural effusions. Chronic fullness of the left adrenal gland is suggestive for hyperplasia. No gross abnormality to the liver or gallbladder. No acute abnormality to the pancreas or spleen. No acute abnormality to the left kidney. Right kidney is atrophic compared to the left kidney with delayed contrast enhancement. No significant free fluid or lymphadenopathy in the abdomen. A large amount of fluid and debris in the stomach. No gross abnormality to the visualized small or large bowel. No acute bone abnormality. IMPRESSION: Enlarging saccular lesion along the left side of the infrarenal abdominal aorta. 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 This lesion is primarily thrombosed  but there is a small area of irregularity along the posterior aspect of the abdominal aorta as described. Recommend vascular surgery consultation. Critical stenosis or near occlusion of the right renal artery with an atrophic right kidney and delayed contrast enhancement in the right kidney. In addition, there appears to be significant stenosis involving the proximal left renal artery which may represent a combination of atherosclerotic disease and compression from the left aortic saccular lesion. 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 Electronically Signed    By: Markus Daft M.D.   On: 05/26/2015 13:12   Ct Abdomen Pelvis W Contrast  05/24/2015  CLINICAL DATA:  65 year old male with abdominal pain. Status post colonoscopy. EXAM: CT ABDOMEN AND PELVIS WITH CONTRAST TECHNIQUE: Multidetector CT imaging of the abdomen and pelvis was performed using the standard protocol following bolus administration of intravenous contrast. CONTRAST:  100 cc Omnipaque 300 COMPARISON:  CT dated 12/19/2007 FINDINGS: The visualized lung bases are clear. No intra-abdominal free air or free fluid. The liver, gallbladder, pancreas, spleen, right adrenal gland appear unremarkable. A 1.3 cm indeterminate left adrenal nodule, likely an adenoma. There is moderate right renal atrophy, increased from prior study. There is asymmetric hypo enhancement of the right renal parenchyma compatible with hypoperfusion likely related to vascular compromise. Ultrasound with duplex interrogation of the right renal artery or CT angiography is recommended for better evaluation. There is no hydronephrosis on the right. A 4 mm nonobstructing left renal upper pole calculus. No hydronephrosis. The visualized ureters and urinary bladder appear unremarkable. The prostate and seminal vesicles are grossly unremarkable. Small hiatal hernia with gastroesophageal reflux. Oral contrast opacifies the stomach and loops of small bowel. There is no evidence of bowel obstruction or inflammation. Appendectomy. Aortoiliac atherosclerotic disease. There is a nonenhancing 3.5 x 3.6 cm (previously 2.5 x 2.7 cm) the structure to the left of the aorta at the level of the left renal artery. There is thin calcification of the wall of this structure. 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 Other etiologies are less likely. Follow-up recommended. There is a 3.3 cm infrarenal abdominal aortic aneurysm (previously 3.0 cm). No portal venous gas identified. There is no adenopathy. The abdominal wall soft tissues appear unremarkable.  There is degenerative changes of the spine. No acute fracture. IMPRESSION: Move no pneumoperitoneum or evidence of traumatic bowel injury. Moderate right renal atrophy with hypoperfusion of the right kidney concerning for vascular compromise. Further evaluation of the right kidney as well as interrogation of the right renal artery with duplex ultrasound or CT angiography recommended. Nonobstructing 4 mm left renal calculus. 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 Follow-up recommended. A 3.3 cm infrarenal abdominal aortic aneurysm. Recommend followup by ultrasound in 3 years. This recommendation follows ACR consensus guidelines: White Paper of the ACR Incidental Findings Committee II on Vascular Findings. J Am Coll Radiol 2013CJ:[id number] Electronically Signed   By: Anner Crete M.D.   On: 05/24/2015 04:41   Dg Chest Port 1 View  06/15/2015  CLINICAL DATA:  65 year old male with respiratory failure. EXAM: PORTABLE CHEST 1 VIEW COMPARISON:  06/14/2015 and prior exams FINDINGS: Cardiomediastinal silhouette is unchanged. An endotracheal tube with tip 4.4 cm above carina, right IJ central venous catheter with tip overlying mid SVC, left IJ central venous catheter with tip overlying mid SVC, and NG tube entering the stomach with tip off the field of view again noted. Minimal left basilar atelectasis is  again noted. There is no evidence of pulmonary edema or pneumothorax. IMPRESSION: Little significant change with minimal left basilar atelectasis and support apparatus as described. Electronically Signed   By: Margarette Canada M.D.   On: 06/15/2015 08:25   Dg Chest Port 1 View  06/14/2015  CLINICAL DATA:  Hypoxia EXAM: PORTABLE CHEST 1 VIEW COMPARISON:  June 13, 2015 FINDINGS: Endotracheal tube tip is 4.4 cm above the carina. Left and right jugular catheter tips are both in the superior vena cava. Nasogastric tube tip and side port is below the diaphragm.  No pneumothorax. There is no edema or consolidation. The heart size and pulmonary vascularity are normal. No adenopathy. Skin staples overlie the lower chest and visualized upper abdominal regions. IMPRESSION: Tube and catheter positions as described without pneumothorax. No edema or consolidation. Electronically Signed   By: Lowella Grip III M.D.   On: 06/14/2015 07:46   Dg Chest Port 1 View  06/13/2015  CLINICAL DATA:  Right central venous line placement, intubation EXAM: PORTABLE CHEST 1 VIEW COMPARISON:  06/12/15 FINDINGS: Endotracheal tube tip 4.3 cm above the carina. Right IJ central line tip over the superior vena cava. No change left central line. No pneumothorax. Mild enlargement of cardiac silhouette similar to prior study. Mild patchy airspace disease left upper lobe of uncertain significance. This is new from the prior study. Decreased left pleural effusion. Decreased right pleural effusion. Persistent but improved airspace opacity in the right central lung. IMPRESSION: No pneumothorax. Endotracheal tube as described. Bilateral effusions and infiltrates as described above Electronically Signed   By: Skipper Cliche M.D.   On: 06/13/2015 11:26   Dg Chest Port 1 View  06/12/2015  CLINICAL DATA:  Shortness of breath and hypertension EXAM: PORTABLE CHEST 1 VIEW COMPARISON:  June 11, 2015 FINDINGS: Swan-Ganz catheter has been removed. Cordis tip is in the superior vena cava. Left central catheter tip is in the superior vena cava. Nasogastric tube tip and side port are below the diaphragm. No pneumothorax. There are bilateral pleural effusions with bibasilar atelectasis. There is mild underlying interstitial edema. There is cardiomegaly. The pulmonary vascularity appears within normal limits. IMPRESSION: Tube and catheter positions as described without pneumothorax. Evidence of a degree of congestive heart failure. Electronically Signed   By: Lowella Grip III M.D.   On: 06/12/2015 08:00   Dg  Chest Port 1 View  06/11/2015  CLINICAL DATA:  Recent abdominal aortic aneurysm repair ; status post extubation EXAM: PORTABLE CHEST 1 VIEW COMPARISON:  June 10, 2015 FINDINGS: Endotracheal tube has been removed. Swan-Ganz catheter tip is in the proximal right main pulmonary artery. Nasogastric tube tip and side port in stomach. Left jugular catheter tip is in the superior vena cava. No pneumothorax. There is no edema or consolidation. Heart is borderline enlarged with pulmonary vascularity within normal limits, stable. No adenopathy evident. IMPRESSION: Tube and catheter positions as described without pneumothorax. No edema or consolidation. Stable cardiac prominence. Electronically Signed   By: Lowella Grip III M.D.   On: 06/11/2015 07:21   Dg Chest Port 1 View  06/10/2015  CLINICAL DATA:  Encounter for central line placement. EXAM: PORTABLE CHEST 1 VIEW COMPARISON:  06/10/2015 at 5:39 a.m. FINDINGS: New dual-lumen left internal jugular central venous line has been placed. The tip projects in the mid to lower superior vena cava. There is no pneumothorax. Right internal jugular Swan-Ganz catheter, oral/nasogastric tube and endotracheal tube are stable in well positioned. Prominent interstitial markings bilaterally are stable. IMPRESSION: 1. New  left internal jugular dual-lumen central venous catheter with its tip in the mid to lower superior vena cava. No pneumothorax. No other change. Electronically Signed   By: Lajean Manes M.D.   On: 06/10/2015 12:37   Dg Chest Port 1 View  06/10/2015  CLINICAL DATA:  65 year old male post abdominal aortic aneurysm repair. Subsequent encounter. EXAM: PORTABLE CHEST 1 VIEW COMPARISON:  06/09/2015 FINDINGS: Endotracheal tube tip 3.3 cm above the carina. Swan-Ganz catheter tip pulmonary outflow tract/main pulmonary artery level. Nasogastric tube courses below the diaphragm. Tip is not included on the present exam. Heart size top-normal.  Slightly tortuous aorta.  Central pulmonary vascular prominence without pulmonary edema. No gross pneumothorax or segmental consolidation. IMPRESSION: Endotracheal tube tip 3.3 cm above the carina. Swan-Ganz catheter tip pulmonary outflow tract/main pulmonary artery level. Heart size top-normal.  Slightly tortuous aorta. No pneumothorax, pulmonary edema or segmental consolidation. Electronically Signed   By: Genia Del M.D.   On: 06/10/2015 07:26   Dg Chest Port 1 View  06/09/2015  CLINICAL DATA:  Post AAA repair. EXAM: PORTABLE CHEST 1 VIEW COMPARISON:  05/05/2015 FINDINGS: Endotracheal tube is 4 cm above the carina. Swan-Ganz catheter tip is in the main pulmonary artery. NG tube enters the stomach. The side port is in the distal esophagus. Lungs are clear. Heart is borderline in size. No effusions or acute bony abnormality. IMPRESSION: No active disease. Electronically Signed   By: Rolm Baptise M.D.   On: 06/09/2015 16:12   Dg Abd Portable 1v  06/16/2015  CLINICAL DATA:  Absent bowel sounds question ileus EXAM: PORTABLE ABDOMEN - 1 VIEW COMPARISON:  Portable exam 1106 hours compared to 06/15/2015 FINDINGS: Surgical clips at midline post laparotomy. Tip of nasogastric tube projects over proximal stomach with proximal side-port at approximately the GE junction. Air-filled nondistended loops of bowel in the abdomen. No gross evidence of bowel wall thickening or obstruction. Bones unremarkable. IMPRESSION: Nonspecific bowel gas pattern. Electronically Signed   By: Lavonia Dana M.D.   On: 06/16/2015 11:32   Dg Abd Portable 1v  06/13/2015  CLINICAL DATA:  Question ileus. Status post aortic aneurysm repair 06/09/2015. EXAM: PORTABLE ABDOMEN - 1 VIEW COMPARISON:  Single view of the abdomen 06/09/2015. FINDINGS: Surgical staples in the midline are again seen. The bowel gas pattern is unremarkable. No focal bony abnormality is identified. IMPRESSION: Negative for bowel obstruction or ileus. Electronically Signed   By: Inge Rise M.D.    On: 06/13/2015 11:20   Dg Abd Portable 1v  06/09/2015  CLINICAL DATA:  Status post abdominal aortic aneurysm repair. EXAM: PORTABLE ABDOMEN - 1 VIEW COMPARISON:  CT scan of May 26, 2015. FINDINGS: The bowel gas pattern is normal. Midline surgical staples are noted consistent with recent surgery. Rounded calcification is seen to the left of the upper lumbar spine consistent with aneurysm seen on prior CT scan. IMPRESSION: No evidence of bowel obstruction or ileus status post surgery. Electronically Signed   By: Marijo Conception, M.D.   On: 06/09/2015 16:05     Assessment and Plan  27M with permanent atrial fib, CAD (remote MI 2000 at [facility] [place] to M1 2008 per chart, cath 09/2010 with patent Cx stent, chronically occluded RCA, 50-70% ostial diag lesions treated medically), chronic combined CHF, HTN, PVD, cerebrovascular disease s/p prior carotid stent. PV angio 05/2015: occluded right renal artery, possible source of pseudoaneurysm on left lateral side of aorta just below the renal artery, calcific stenosis of R>L BIA, diseased suprarenal aorta. Admitted 06/09/15  for planned vascular surgery with subsequent repair of juxtarenal abdominal aortic aneurysm, left renal artery bypass. Post-op couse notable for post-op respiratory failure requiring intubation, renal failure with anuria & hyperkalemia requiring initiation of CVVH, serratia in sputum with concern for developing HCAP, possible ileus and anemia/thrombocytopenia.  1. S/p juxtarenal abdominal aortic aneurysm, left renal artery bypass 06/09/15 with above post-op complications - per vascular/PCCM.  2. Permanent atrial fibrillation - elevated rates likely driven up this admission by multitude of acute medical issues. Amiodarone is currently being used for rate control. Long-term plan for this can be determined when acute issues have settled down. Will discuss initiation of scheduled IV metoprolol with MD since pressures are stable and he has history of  CAD. (?Perhaps it may help with his nausea to sub out BB for amiodarone.) Resume anticoag when OK with vascular surgery.  3. Acute on chronic combined CHF with volume overload mostly due to acute renal failure on CKD - management of volume per nephrology. Not a candidate for ACEI/ARB right now with renal dysfunction. EF 35-40%, c/w prior MRI in 2012. We can consider using Toprol rather than Lopressor at d/c given his LV dysfunction.  4. CAD - no active angina this admission. As above, will review resumption of BB with MD. Not currently taking POs. Can resume statin upon discharge. Not on ASA prior to admission, question due to concomitant warfarin use.  Signed, Melina Copa PA-C Pager: [phone]  I have seen, examined and evaluated the patient this [facility] along with Mrs. Dunn, PA-C.  After reviewing all the available data and chart,  I agree with her findings, examination as well as impression recommendations.  Slow progression post Aortic PSA Repair with progression of CKD to ~ ESRD on HD. No active CHF Sx - volume control by Nephrology with HD. Afib rate relatively controlled -- will start BB (as IV for now 2.5 mg Lopressor q6hr) with plan to titrate up & convert to PO while allowing [place] to wean off Amiodarone. -- may help nausea.  Restart Anticoagulation when safe per Vascular Sgx - Warfarin without Heparin bridge.  No active angina - awating return of bowel Fxn for taking PO -- then, we can re-institue PO regimen.   Leonie Man, M.D., M.S. Interventional Cardiologist   Pager # [phone] Phone # [phone] [address] [place], [place] [address]

## 2015-06-19 NOTE — Evaluation (Signed)
Occupational Therapy Evaluation Patient Details Name: [name] [name] MRN: [id number] DOB: 29-Jul-1950 Today's Date: 06/19/2015    History of Present Illness 65 yo Male with PMH as below, which is significant HTN, Atrial Fib on warfarin, CVA, and known AAA. He was recently admitted to [facility] ED 1/20 with complaints of abdominal pain. CT abdomen at that time showed evidence of hypoperfusion to the R kidney. CT angio showed some enlargement of the seat of an aneurysm. 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 It was on that day that he presented, and underwent the procedures.    Clinical Impression   Pt was independent prior to admission. Presents with generalized weakness, incisional pain, poor activity tolerance, impaired cognition and decreased balance interfering with ability to perform ADL and mobility.  Will follow acutely. Pt will need post acute rehab prior to return home.    Follow Up Recommendations  SNF;Supervision/Assistance - 24 hour    Equipment Recommendations   (determine in next venue)    Recommendations for Other Services       Precautions / Restrictions Precautions Precautions: Fall Precaution Comments: CVVHD transitioned to HD on 06/17/15 Restrictions Weight Bearing Restrictions: No      Mobility Bed Mobility    General bed mobility comments: pt in chair  Transfers Overall transfer level: Needs assistance Equipment used: Rolling walker (2 wheeled) Transfers: Sit to/from [employer] Transfers Sit to Stand: +2 safety/equipment;Min  assist Stand pivot transfers: Min assist;+2 safety/equipment       General transfer comment: min assist to rise, verbal and tactile cues for hand placement with use of walker, stood x 2 minutes for clean up with walker     Balance Overall balance assessment: Needs assistance   Sitting balance-Leahy Scale: Fair     Standing balance support: Single extremity supported Standing balance-Leahy Scale: Poor                              ADL Overall ADL's : Needs assistance/impaired     Grooming: Wash/dry hands;Wash/dry face;Set up;Sitting   Upper Body Bathing: Minimal assitance;Sitting   Lower Body Bathing: Maximal assistance;Sit to/from stand   Upper Body Dressing : Minimal assistance;Sitting   Lower Body Dressing: Maximal assistance;Sit to/from stand Lower Body Dressing Details (indicate cue type and reason): pt is able to cross foot over opposite knee, but donning sock is painful Toilet Transfer: Minimal assistance;+2 for safety/equipment;Stand-pivot;BSC;RW   Toileting- Clothing Manipulation and Hygiene: Total assistance;Sit to/from stand         General ADL Comments: Pt incontinent of bowel, assist to clean up and change gown.     Vision     Perception     Praxis      Pertinent Vitals/Pain Pain Assessment: 0-10 Pain Score: 8  Pain Location: incision Pain Descriptors / Indicators: Operative site guarding;Guarding;Sore Pain Intervention(s): Limited activity within patient's tolerance;Monitored during session;Premedicated before session;Repositioned     Hand Dominance Right   Extremity/Trunk Assessment Upper Extremity Assessment Upper Extremity Assessment: Generalized weakness   Lower Extremity Assessment Lower Extremity Assessment: Defer to PT evaluation  Communication Communication Communication: No difficulties   Cognition Arousal/Alertness: Awake/alert Behavior During Therapy: Flat affect;Impulsive Overall Cognitive Status:  Impaired/Different from baseline Area of Impairment: Attention;Following commands;Safety/judgement   Current Attention Level: Selective   Following Commands: Follows one step commands inconsistently Safety/Judgement: Decreased awareness of safety;Decreased awareness of deficits         General Comments       Exercises       Shoulder Instructions      Home Living Family/patient expects to be discharged to:: Private residence Living Arrangements: Spouse/significant other Available Help at Discharge: Family;Available PRN/intermittently Type of Home: House Home Access: Stairs to enter [employer] of Steps: 4 Entrance Stairs-Rails: Right Home Layout: One level     Bathroom Shower/Tub: Occupational psychologist: Standard     Home Equipment: None          Prior Functioning/Environment Level of Independence: Independent        Comments: retired, still drives and is able to do grocery shopping    OT Diagnosis: Generalized weakness;Acute pain;Cognitive deficits   OT Problem List: Decreased strength;Decreased activity tolerance;Impaired balance (sitting and/or standing);Decreased cognition;Decreased safety awareness;Decreased knowledge of use of DME or AE;Cardiopulmonary status limiting activity;Pain   OT Treatment/Interventions: Self-care/ADL training;Energy conservation;DME and/or AE instruction;Cognitive remediation/compensation;Therapeutic activities;Patient/family education;Balance training    OT Goals(Current goals can be found in the care plan section) Acute Rehab OT Goals Patient Stated Goal: feel better OT Goal Formulation: With patient Time For Goal Achievement: 07/03/15 Potential to Achieve Goals: Good ADL Goals Pt Will Perform Grooming: with min assist;standing (2 activities) Pt Will Perform Upper Body Dressing: with min assist;sitting Pt Will Perform Lower Body Dressing: with min assist;sit to/from stand Pt Will Transfer to Toilet:  with min assist;ambulating;bedside commode Pt Will Perform Toileting - Clothing Manipulation and hygiene: with min assist;sit to/from stand Additional ADL Goal #1: Pt will demonstrate use of energy conservation strategies and breathing technique during ADL and mobility with min verbal cues.  OT Frequency: Min 2X/week   Barriers to D/C:            Co-evaluation              End of Session Equipment Utilized During Treatment: Surveyor, mining Communication: Mobility status  Activity Tolerance: Patient limited by fatigue Patient left: in chair;with call bell/phone within reach;with chair alarm set   Time: 1515-1535 OT Time Calculation (min): 20 min Charges:  OT General Charges $OT Visit: 1 Procedure OT Evaluation $OT Eval High Complexity: 1 Procedure G-Codes:    [name] 06/19/2015, 3:56 PM

## 2015-06-20 ENCOUNTER — Inpatient Hospital Stay (HOSPITAL_COMMUNITY): Payer: Medicare Other

## 2015-06-20 LAB — CBC
HCT: 23.1 % — ABNORMAL LOW (ref 39.0–52.0)
Hemoglobin: 8 g/dL — ABNORMAL LOW (ref 13.0–17.0)
MCH: 28.5 pg (ref 26.0–34.0)
MCHC: 34.6 g/dL (ref 30.0–36.0)
MCV: 82.2 fL (ref 78.0–100.0)
Platelets: 181 10*3/uL (ref 150–400)
RBC: 2.81 MIL/uL — ABNORMAL LOW (ref 4.22–5.81)
RDW: 16.5 % — ABNORMAL HIGH (ref 11.5–15.5)
WBC: 15 10*3/uL — ABNORMAL HIGH (ref 4.0–10.5)

## 2015-06-20 LAB — GLUCOSE, CAPILLARY
Glucose-Capillary: 110 mg/dL — ABNORMAL HIGH (ref 65–99)
Glucose-Capillary: 124 mg/dL — ABNORMAL HIGH (ref 65–99)
Glucose-Capillary: 139 mg/dL — ABNORMAL HIGH (ref 65–99)
Glucose-Capillary: 141 mg/dL — ABNORMAL HIGH (ref 65–99)
Glucose-Capillary: 150 mg/dL — ABNORMAL HIGH (ref 65–99)

## 2015-06-20 LAB — RENAL FUNCTION PANEL
Albumin: 1.8 g/dL — ABNORMAL LOW (ref 3.5–5.0)
Anion gap: 18 — ABNORMAL HIGH (ref 5–15)
BUN: 129 mg/dL — ABNORMAL HIGH (ref 6–20)
CO2: 17 mmol/L — ABNORMAL LOW (ref 22–32)
Calcium: 8.9 mg/dL (ref 8.9–10.3)
Chloride: 91 mmol/L — ABNORMAL LOW (ref 101–111)
Creatinine, Ser: 7.08 mg/dL — ABNORMAL HIGH (ref 0.61–1.24)
GFR calc Af Amer: 9 mL/min — ABNORMAL LOW (ref >60)
GFR calc non Af Amer: 7 mL/min — ABNORMAL LOW (ref >60)
Glucose, Bld: 162 mg/dL — ABNORMAL HIGH (ref 65–99)
Phosphorus: 7.5 mg/dL — ABNORMAL HIGH (ref 2.5–4.6)
Potassium: 4.6 mmol/L (ref 3.5–5.1)
Sodium: 126 mmol/L — ABNORMAL LOW (ref 135–145)

## 2015-06-20 LAB — PROCALCITONIN: Procalcitonin: 5.29 ng/mL

## 2015-06-20 LAB — AMYLASE: Amylase: 156 U/L — ABNORMAL HIGH (ref 28–100)

## 2015-06-20 LAB — PREPARE RBC (CROSSMATCH)

## 2015-06-20 LAB — MAGNESIUM: Magnesium: 2 mg/dL (ref 1.7–2.4)

## 2015-06-20 LAB — LIPASE, BLOOD: Lipase: 258 U/L — ABNORMAL HIGH (ref 11–51)

## 2015-06-20 MED ORDER — SODIUM CHLORIDE 0.9 % IV SOLN: Freq: Once | INTRAVENOUS | Status: DC

## 2015-06-20 MED ORDER — TRACE MINERALS CR-CU-MN-SE-ZN 10-1000-500-60 MCG/ML IV SOLN
INTRAVENOUS | Status: AC
  Administered 2015-06-20: 18:00:00 via INTRAVENOUS
  Filled 2015-06-20: qty 1992

## 2015-06-20 MED ORDER — VITAL HIGH PROTEIN PO LIQD: 1000.0000 mL | ORAL | Status: DC

## 2015-06-20 MED ORDER — NEPRO/CARBSTEADY PO LIQD
1000.0000 mL | ORAL | Status: DC
  Administered 2015-06-20: 1000 mL
  Filled 2015-06-20: qty 1000

## 2015-06-20 MED ORDER — DEXTROSE 10 % IV SOLN
INTRAVENOUS | Status: DC
  Administered 2015-06-20: 09:00:00 via INTRAVENOUS

## 2015-06-20 MED ORDER — ZOLPIDEM TARTRATE 5 MG PO TABS
5.0000 mg | ORAL_TABLET | Freq: Every evening | ORAL | Status: DC | PRN
  Administered 2015-06-20 – 2015-06-27 (×2): 5 mg via ORAL
  Filled 2015-06-20 (×2): qty 1

## 2015-06-20 MED ORDER — NEPRO/CARBSTEADY PO LIQD
237.0000 mL | ORAL | Status: DC
  Filled 2015-06-20 (×2): qty 237

## 2015-06-20 MED ORDER — FENTANYL CITRATE (PF) 100 MCG/2ML IJ SOLN
INTRAMUSCULAR | Status: AC
  Administered 2015-06-20: 50 ug via INTRAVENOUS
  Filled 2015-06-20: qty 2

## 2015-06-20 MED ORDER — METOPROLOL TARTRATE 1 MG/ML IV SOLN
5.0000 mg | Freq: Four times a day (QID) | INTRAVENOUS | Status: DC
  Administered 2015-06-21 – 2015-06-25 (×16): 5 mg via INTRAVENOUS
  Filled 2015-06-20 (×16): qty 5

## 2015-06-20 MED ORDER — FAT EMULSION 20 % IV EMUL
240.0000 mL | INTRAVENOUS | Status: AC
  Administered 2015-06-20: 240 mL via INTRAVENOUS
  Filled 2015-06-20: qty 250

## 2015-06-20 MED ORDER — VITAL AF 1.2 CAL PO LIQD
1000.0000 mL | ORAL | Status: DC
Start: 2015-06-20 — End: 2015-06-25
  Filled 2015-06-20 (×6): qty 1000

## 2015-06-20 NOTE — Care Management Note (Addendum)
Case Management Note  Patient Details  Name: [name] [name] MRN: [id number] Date of Birth: November 17, 1950  Subjective/Objective:    He underwent an elective surgical repair including an aorto to left renal bypass procedure on 06/09/2015 and developed VDRF -  Transferred to ICU                Action/Plan:  06/20/2015 Pt extubated, on intermittent HD, IV amio, TPN due to prolonged post op ileus.  Plan is for pt to have tunneled HD catheter placed early next week.  Pt deemed appropriate for LTACH per attending and physician advisor.  Pt in HD, CM spoke with wife , agreement was obtained for [facility] placement insurance authorization for possible discharge early next week, wife offered choice of [facility] and [facility], chose [facility] due to location.  Wife advised CM against talking with pt during HD; wife wants to discuss it with pt this weekend.  CM will follow up on Monday and make adjustments to discharge plans accordingly.  [facility] actively seeking insurance auth   Pt re intubated 06/13/15  Pt is from home with wife and daughter serves as support person.  CM will continue to monitor for disposition needs   Expected Discharge Date:                  Expected Discharge Plan:  [place]  In-House Referral:     Discharge planning Services  CM Consult  Post Acute Care Choice:    Choice offered to:     DME Arranged:    DME Agency:     HH Arranged:    HH Agency:     Status of Service:  In process, will continue to follow  Medicare Important Message Given:  Yes Date Medicare IM Given:    Medicare IM give by:    Date Additional Medicare IM Given:    Additional Medicare Important Message give by:     If discussed at [place] of Stay Meetings, dates discussed:    Additional Comments:  Maryclare Labrador, RN 06/20/2015, 10:10 AM

## 2015-06-20 NOTE — Progress Notes (Signed)
Renal On Call Note  Received a call from HD RN Dr. Oneida Alar had wanted the pt transfused on dialysis but that message was not passed along to the HD nurse (who was unaware that he needed blood until his treatment was complete) I advise transfusion of 1 unit on the floor over 4 hours, CBC in the AM and we can then decide if needs the second unit and if additional HD will be needed to facilitate that.   Jamal Maes, MD [facility] [phone] Pager 06/20/2015, 6:58 PM

## 2015-06-20 NOTE — Progress Notes (Signed)
Pt with elevated lipase and amylase consistent with pancreatitis Will place post pyloric tube for trickle feeds otherwise continue [place], MD [facility] [place] Office: [phone] Pager: [phone]

## 2015-06-20 NOTE — Progress Notes (Signed)
[facility] [place]  Subjective  - still intermittent nausea   Objective 149/55 83 98.7 F (37.1 C) (Oral) 22 100%  Intake/Output Summary (Last 24 hours) at 06/20/15 0747 Last data filed at 06/20/15 0700  Gross per 24 hour  Intake 3300.8 ml  Output      0 ml  Net 3300.8 ml   Abdomen soft minimal tenderness Feet warm Still anuric  Chest xray negative Abdomen xray no obstruction  Assessment/Planning: Place feeding tube post pyloric today Check lipase and amylase to rule out pancreatitis Tunneled dialysis catheter early next week Transfer out of ICU later today if bed needed and feeding tube confirmed in place Hemoglobin slowly drifting down with some oxygen requirements.  Transfuse with dialysis Leukocytosis no obvious source currently.  Will check blood cultures off lines Uremia with BUN 129 ? Source of nausea?  Ruta Hinds 06/20/2015 7:47 AM --  Laboratory Lab Results:  Recent Labs  06/19/15 0803 06/20/15 0336  WBC 12.9* 15.0*  HGB 8.0* 8.0*  HCT 23.9* 23.1*  PLT 152 181   BMET  Recent Labs  06/19/15 0341 06/20/15 0335  NA 132* 126*  K 3.7 4.6  CL 96* 91*  CO2 22 17*  GLUCOSE 151* 162*  BUN 82* 129*  CREATININE 4.79* 7.08*  CALCIUM 8.6* 8.9    COAG Lab Results  Component Value Date   INR 1.39 06/10/2015   INR 1.40 06/09/2015   INR 1.07 06/09/2015   No results found for: PTT

## 2015-06-20 NOTE — Progress Notes (Signed)
Assessment/ Plan:   1. AKI due to ATN: Anuric--hemodynamically mediated( a chronically occluded R kidney with a pseudoaneurysm on the left and significant hypovolemia/hypotension intra-operatively and post-operatively with an estimated blood loss of 4L) (Cr 1.21 on 04/09/15). S/p CVVHD HD again today         Subjective: Interval History: NO UOP  Objective: Vital signs in last 24 hours: Temp:  [97.7 F (36.5 C)-99.6 F (37.6 C)] 97.7 F (36.5 C) (02/17 1100) Pulse Rate:  [26-88] 82 (02/17 1000) Resp:  [12-26] 21 (02/17 1000) BP: (99-160)/(55-80) 158/61 mmHg (02/17 1000) SpO2:  [95 %-100 %] 98 % (02/17 1000) Weight:  [87 kg (191 lb 12.8 oz)] 87 kg (191 lb 12.8 oz) (02/17 0500) Weight change: 3.4 kg (7 lb 7.9 oz)  Intake/Output from previous day: 02/16 0701 - 02/17 0700 In: 3300.8 [I.V.:440.8; TPN:2860] Out: -  Intake/Output this shift: Total I/O In: 391.3 [I.V.:190; TPN:201.3] Out: -   General appearance: alert and cooperative Resp: clear to auscultation bilaterally Cardio: irregularly irregular rhythm Extremities: extremities normal, atraumatic, no cyanosis or edema  Lab Results:  Recent Labs  06/19/15 0803 06/20/15 0336  WBC 12.9* 15.0*  HGB 8.0* 8.0*  HCT 23.9* 23.1*  PLT 152 181   BMET:  Recent Labs  06/19/15 0341 06/20/15 0335  NA 132* 126*  K 3.7 4.6  CL 96* 91*  CO2 22 17*  GLUCOSE 151* 162*  BUN 82* 129*  CREATININE 4.79* 7.08*  CALCIUM 8.6* 8.9   No results for input(s): PTH in the last 72 hours. Iron Studies: No results for input(s): IRON, TIBC, TRANSFERRIN, FERRITIN in the last 72 hours. Studies/Results: Dg Abd 1 View  06/20/2015  CLINICAL DATA:  Post abdominal aortic aneurysm repair and LEFT renal artery bypass, leukocytosis, history smoking, hypertension, atrial fibrillation, coronary artery disease post MI, testicular cancer EXAM: ABDOMEN - 1 VIEW COMPARISON:  Portable exam [id number] hours compared to 06/16/2015 FINDINGS: Post laparotomy.  Normal bowel gas pattern. No bowel dilatation or bowel wall thickening. Osseous structures normal. No urinary tract calcification. Visualized lung bases clear. IMPRESSION: Normal bowel gas pattern. Electronically Signed   By: Lavonia Dana M.D.   On: 06/20/2015 08:19   Dg Chest Port 1 View  06/20/2015  CLINICAL DATA:  Respiratory failure, abdominal aortic aneurysm repair 11 days ago EXAM: PORTABLE CHEST 1 VIEW COMPARISON:  Portable chest x-ray of June 15, 2015 FINDINGS: The lungs are adequately inflated and clear the endotracheal tube and esophagogastric tube have been removed. The heart is mildly enlarged but stable. The pulmonary vascularity is not engorged. The mediastinum is normal in width. There is no significant pleural effusion demonstrated. The bilateral internal jugular catheters have their tips overlying the proximal SVC. IMPRESSION: Interval extubation of the trachea. Top-normal cardiac size without pulmonary vascular congestion. There is no evidence of pneumonia or significant atelectasis. Electronically Signed   By: David  Martinique M.D.   On: 06/20/2015 07:14   Dg Abd Portable 1v  06/20/2015  CLINICAL DATA:  Feeding tube placement EXAM: PORTABLE ABDOMEN - 1 VIEW COMPARISON:  Study obtained earlier in the day FINDINGS: Feeding tube tip in region of second portion of duodenum. Bowel gas pattern unremarkable. There are skin staples in the midline. Lung bases appear clear. IMPRESSION: Feeding tube tip in region of second portion of duodenum. Bowel gas pattern unremarkable. Electronically Signed   By: Lowella Grip III M.D.   On: 06/20/2015 11:00    Scheduled: . sodium chloride   Intravenous Once  . alteplase  2 mg Intracatheter Once  . antiseptic oral rinse  7 mL Mouth Rinse q12n4p  . chlorhexidine  15 mL Mouth Rinse BID  . enoxaparin (LOVENOX) injection  30 mg Subcutaneous Q24H  . insulin aspart  0-15 Units Subcutaneous 6 times per day  . metoprolol  5 mg Intravenous 4 times per day   . pantoprazole (PROTONIX) IV  40 mg Intravenous Q24H  . sodium chloride flush  10-40 mL Intracatheter Q12H     LOS: 11 days   Gervis Gaba C 06/20/2015,12:53 PM

## 2015-06-20 NOTE — Progress Notes (Signed)
Nutrition Consult/Follow Up  DOCUMENTATION CODES:   Not applicable  INTERVENTION:   Initiate Vital AF 1.2 formula at 20 ml/hr and increase by 10 ml every 8 hours to goal rate of 80 ml/hr  Above TF regimen to provide 2304 kcals, 144 gm protein, 1557 ml of free water  NUTRITION DIAGNOSIS:   Inadequate oral intake related to inability to eat as evidenced by NPO status, ongoing  GOAL:   Patient will meet greater than or equal to 90% of their needs, progressing  MONITOR:   TF tolerance, Labs, Weight trends, Skin, I & O's  ASSESSMENT:   65 yo Male with PMH as below, which is significant HTN, Atrial Fib on warfarin, CVA, and known AAA. He was recently admitted to [facility] ED 1/20 with complaints of abdominal pain. CT abdomen at that time showed evidence of hypoperfusion to the R kidney. CT angio showed some enlargement of the seat of an aneurysm. 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 It was on that day that he presented, and underwent the procedures. Estimated 4L blood loss during case. Post operatively he remains on vent in surgical ICU.   Patient s/p procedure 2/6: REPAIR OF JUXTARENAL ABDOMINAL AORTIC ANEURYSM, LEFT RENAL BYPASS  Patient extubated 2/12.  Nephrology note 2/17 reviewed. Pt s/p CVVHD; for HD again today.  Patient is receiving TPN via CVC with Clinimix E 5/15 @ 85 ml/hr. Lipids (20% IVFE at 10 ml/hr) MWF. Provides daily average of 1620 kcal and 100 grams protein per day.   Small bore feeding tube placed per [facility] team.  Tip is in the second  portion of duodenum.  RD consulted for TF initiation & management.  Diet Order:  Diet full liquid Room service appropriate?: Yes; Fluid consistency:: Thin TPN (CLINIMIX) Adult without lytes  Skin:  Reviewed, no issues  Last BM:  2/17  Height:   Ht Readings from Last 1 Encounters:  06/09/15 6' (1.829 m)    Weight:   Wt Readings from Last 1 Encounters:  06/20/15 191 lb 12.8 oz (87 kg)    Ideal Body Weight:  81 kg  BMI:  Body mass index is 26.01 kg/(m^2).  Estimated Nutritional Needs:   Kcal:  2100-2300  Protein:  135-145 gm  Fluid:  per MD  EDUCATION NEEDS:   No education needs identified at this time  Arthur Holms, RD, LDN Pager #: [phone] After-Hours Pager #: [phone]

## 2015-06-20 NOTE — Progress Notes (Addendum)
PARENTERAL NUTRITION CONSULT NOTE  Pharmacy Consult:  TPN Indication:  Prolonged ileus  No Known Allergies  Patient Measurements:  Height: 6' (182.9 cm) Weight: 191 lb 12.8 oz (87 kg) IBW/kg (Calculated) : 77.6  Vital Signs: Temp: 98.7 F (37.1 C) (02/17 0700) Temp Source: Oral (02/17 0700) BP: 149/55 mmHg (02/17 0600) Pulse Rate: 83 (02/17 0700) Intake/Output from previous day: 02/16 0701 - 02/17 0700 In: 3300.8 [I.V.:440.8; TPN:2860] Out: -   Labs:  Recent Labs  06/18/15 0500 06/19/15 0803 06/20/15 0336  WBC 12.7* 12.9* 15.0*  HGB 9.1* 8.0* 8.0*  HCT 26.4* 23.9* 23.1*  PLT 123* 152 181     Recent Labs  06/18/15 0430 06/19/15 0341 06/20/15 0335 06/20/15 0336  NA 129* 132* 126*  --   K 4.0 3.7 4.6  --   CL 93* 96* 91*  --   CO2 18* 22 17*  --   GLUCOSE 148* 151* 162*  --   BUN 114* 82* 129*  --   CREATININE 5.55* 4.79* 7.08*  --   CALCIUM 9.1 8.6* 8.9  --   MG 2.5* 2.0  --  2.0  PHOS 5.8* 4.7* 7.5*  --   PROT  --  5.5*  --   --   ALBUMIN 2.0* 1.9* 1.8*  --   AST  --  23  --   --   ALT  --  37  --   --   ALKPHOS  --  108  --   --   BILITOT  --  0.5  --   --    Estimated Creatinine Clearance: 11.9 mL/min (by C-G formula based on Cr of 7.08).    Recent Labs  06/19/15 1904 06/19/15 2340 06/20/15 0341  GLUCAP 133* 124* 139*     Insulin Requirements in the past 24 hours:  12 units SSI  Assessment: 32 YOM with history of occluded right renal artery discovered on 05/28/15 presented on 06/09/15 for AAA repair, left renal artery bypass and aortic endarterectomy.  Post-op course was complicated by respiratory failure requiring intubation and AKI requiring dialysis.  Pharmacy consulted to initiate TPN for prolonged ileus on 06/13/15.   GI: GERD - Albumin low at 1.9 and prealbumin low at 11.2. Advanced to full liquids but appetite is poor d/t intermittent nausea.  Stool x7.  PPI IV Endo: TSH WNL.  No hx of DM - CBGs well controlled with SSI Lytes:  alternating days with and without electrolytes in TPN - low Na/CL/CO2, Phos elevated at 7.5, CoCa elevated at 10.66 (Ca x Phos = 79.95, goal < 55) Renal: AKI s/p HD x1 on 2/8, then CRRT 2/9 >> 2/14, iHD resumed on 2/15, again today (BL SCr 1.2), BUN elevated at 129.  Tunneled dialysis cath next week. Pulm: hx tobacco use, intubated 2/10, currently on 2L [place] Cards: ASCVD / HTN / Afib / PVD / HLD - VSS - Lopressor IV, amiodarone gtt Hepatobil: amylase elevated at 225, LFTs normalized, tbili WNL.  TG 449 > 266  AC/Heme: Coumadin for Afib stopped 2/1 PTA - HIT Ab negative - hgb 8, and plts recovered to 181 - to be transfused with dialysis Neuro: hx CVA.  Frequent usage of PRN Fentanyl ID: s/p tx for HCAP. Afebrile, WBC increased to 15, PCT 5.29  Zosyn 2/10 >> 2/13 Vanc 2/10 >> 2/13 CTX 2/13 >> 2/16  2/10 endotracheal cx - Serratia (sensitive to CTX, Septra, Cipro)  Best Practices: Lovenox, MC TPN Access: CVC triple lumen placed 06/13/15  TPN start date: 2/10 >>  Current Nutrition:  Full liquid diet (eating <5% of meals) TPN   Nutritional Goals: per RD 2/13 KCal: 2100-2300 Protein: 135-145 g Fluid: per MD    Plan:  - Hold current TPN bag and hang D10W at 115 ml/hr, concerned with precipitation given elevated Ca-Phos product, d/c IVF at 1800 when new TPN bag starts -  Change to Clinimix 5/15 (no lytes) and reduce rate to 85 ml/hr + continue ILE 20% at 10 ml/hr on MWF only.  TPN will provide a daily average of 1620 kCal and 100gm of protein per day, meeting 77% of minimal kCal and 75% of minimal protein needs.  Although previous TPN is providing ~1.5 g/kg of protein per day and is appropriate for patient on dialysis, will reduce provision given rising BUN concerning for uremia, now to provide ~1.1 g/kg/day.   - Continue daily multivitamin and trace elements in TPN - Continue moderate SSI - Monitor TPN labs in AM post HD today - Watch WBC trend, f/u repeat amylase/lipase - Consider changing  from Lovenox to heparin SQ for VTE px - F/U with post-pyloric feeding tube and TF initiation/tolerance to start weaning TPN    [name] [name] D. Mina Marble, PharmD, BCPS Pager:  [phone] 06/20/2015, 8:40 AM

## 2015-06-20 NOTE — Care Management Important Message (Signed)
Important Message  Patient Details  Name: [name] [name] MRN: [id number] Date of Birth: March 29, 1951   Medicare Important Message Given:  Yes    Nathen May 06/20/2015, 2:05 PM

## 2015-06-20 NOTE — Progress Notes (Signed)
UR Completed. Evangela Heffler, RN, BSN.  [phone] 

## 2015-06-20 NOTE — Progress Notes (Signed)
Dr. Ruta Hinds odered 2 units to be given in HD; it was not communicated during report; Dr. Lorrene Reid made aware she ordered to have the floor RN give one unit of PRBCs over 4hrs recheck CBC and have nephrology assess in the morning. Communicated the msg to [place] the Air traffic controller.

## 2015-06-20 NOTE — Progress Notes (Signed)
Physical Therapy Treatment Patient Details Name: [name] [name] MRN: [id number] DOB: [birth year] Today's Date: 06/20/2015    History of Present Illness 65 yo Male with PMH as below, which is significant HTN, Atrial Fib on warfarin, CVA, and known AAA. He was recently admitted to [facility] ED 1/20 with complaints of abdominal pain. CT abdomen at that time showed evidence of hypoperfusion to the R kidney. CT angio showed some enlargement of the seat of an aneurysm. 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 It was on that day that he presented, and underwent the procedures.     PT Comments    Patient with fatigue today. Agreed to perform exercises with PT.  Follow Up Recommendations  SNF;Supervision/Assistance - 24 hour     Equipment Recommendations  None recommended by PT    Recommendations for Other Services       Precautions / Restrictions Precautions Precautions: Fall Restrictions Weight Bearing Restrictions: No    Mobility  Bed Mobility Overal bed mobility: Needs Assistance Bed Mobility: Rolling Rolling: Min assist         General bed mobility comments: Min assist to roll and reposition toward HOB.  Transfers                 General transfer comment: Going to HD today - declined  Ambulation/Gait                 Stairs            Wheelchair Mobility    Modified Rankin (Stroke Patients Only)       Balance                                    Cognition Arousal/Alertness: Awake/alert Behavior During  Therapy: Flat affect;Impulsive Overall Cognitive Status: Within Functional Limits for tasks assessed                      Exercises General Exercises - Upper Extremity Shoulder Flexion: AROM;Both;10 reps;Supine Elbow Flexion: AROM;Both;10 reps;Supine General Exercises - Lower Extremity Ankle Circles/Pumps: AROM;Both;10 reps;Supine Quad Sets: AROM;Both;10 reps;Supine Heel Slides: AROM;Both;10 reps;Supine Hip ABduction/ADduction: AROM;Both;10 reps;Supine    General Comments        Pertinent Vitals/Pain Pain Assessment: 0-10 Pain Score: 4  Pain Location: Incision site Pain Descriptors / Indicators: Operative site guarding Pain Intervention(s): Limited activity within patient's tolerance;Repositioned    Home Living                      Prior Function            PT Goals (current goals can now be found in the care plan section) Progress towards PT goals: Progressing toward goals    Frequency  Min 3X/week    PT Plan Current plan remains appropriate    Co-evaluation             End of Session Equipment Utilized During Treatment: Oxygen Activity Tolerance: Patient limited by fatigue Patient left: in bed;with call bell/phone within reach;with  bed alarm set     Time: 1436-1446 PT Time Calculation (min) (ACUTE ONLY): 10 min  Charges:  $Therapeutic Exercise: 8-22 mins                    G Codes:      Despina Pole 07-10-15, 5:11 PM Carita Pian. Sanjuana Kava, [place] Pager [phone]

## 2015-06-20 NOTE — Care Management Important Message (Signed)
Important Message  Patient Details  Name: [name] [name] MRN: [id number] Date of Birth: 1951-02-10   Medicare Important Message Given:  Yes    Nathen May 06/20/2015, 12:05 PM

## 2015-06-20 NOTE — Progress Notes (Signed)
Patient: [name] [name] / Admit Date: 06/09/2015 / Date of Encounter: 06/20/2015, 10:49 AM   Subjective: C/o nausea. Still -- KUB being done. ? Concern for pancreatitis. Feeding tube ordered by Dr. Oneida Alar.  Objective: Telemetry: atrial fib, rates controlled. Brief bradycardia overnight (50bpm) Physical Exam: Blood pressure 158/61, pulse 82, temperature 98.7 F (37.1 C), temperature source Oral, resp. rate 21, height 6' (1.829 m), weight 191 lb 12.8 oz (87 kg), SpO2 98 %. General: Well developed, well nourished WM, notably weak & nauseated. Head: Normocephalic, atraumatic, sclera non-icteric, no xanthomas, nares are without discharge. Neck: JVP not elevated. Lungs: Clear bilaterally to auscultation without wheezes, rales, or rhonchi. Breathing is unlabored. Heart: Irregularly irregular, rate controlled, without murmurs, rubs, or gallops.  Abdomen: Soft, appropriately tender, non-distended with normoactive bowel sounds. No rebound/guarding. Extremities: No clubbing or cyanosis. No edema. Distal pedal pulses are 2+ and equal bilaterally. Neuro: Alert and oriented X 3. Moves all extremities spontaneously. Psych:  Responds to questions appropriately with a normal affect.   Intake/Output Summary (Last 24 hours) at 06/20/15 1049 Last data filed at 06/20/15 1000  Gross per 24 hour  Intake 3266.97 ml  Output      0 ml  Net 3266.97 ml    Inpatient Medications:  . sodium chloride   Intravenous Once  . alteplase  2 mg Intracatheter Once  . antiseptic oral rinse  7 mL Mouth Rinse q12n4p  . chlorhexidine  15 mL Mouth Rinse BID  . enoxaparin (LOVENOX) injection  30 mg Subcutaneous Q24H  . insulin aspart  0-15 Units Subcutaneous 6 times per day  . metoprolol  2.5 mg Intravenous 4 times per day  . pantoprazole (PROTONIX) IV  40 mg Intravenous Q24H  . sodium chloride flush  10-40 mL Intracatheter Q12H   Infusions:  . sodium chloride Stopped (06/18/15 1300)  . amiodarone 30 mg/hr (06/20/15  0524)  . dextrose 115 mL/hr at 06/20/15 0847  . TPN (CLINIMIX) Adult without lytes     And  . fat emulsion    . phenylephrine (NEO-SYNEPHRINE) Adult infusion Stopped (06/14/15 1100)    Labs:  Recent Labs  06/19/15 0341 06/20/15 0335 06/20/15 0336  NA 132* 126*  --   K 3.7 4.6  --   CL 96* 91*  --   CO2 22 17*  --   GLUCOSE 151* 162*  --   BUN 82* 129*  --   CREATININE 4.79* 7.08*  --   CALCIUM 8.6* 8.9  --   MG 2.0  --  2.0  PHOS 4.7* 7.5*  --     Recent Labs  06/19/15 0341 06/20/15 0335  AST 23  --   ALT 37  --   ALKPHOS 108  --   BILITOT 0.5  --   PROT 5.5*  --   ALBUMIN 1.9* 1.8*    Recent Labs  06/19/15 0803 06/20/15 0336  WBC 12.9* 15.0*  HGB 8.0* 8.0*  HCT 23.9* 23.1*  MCV 82.7 82.2  PLT 152 181   No results for input(s): CKTOTAL, CKMB, TROPONINI in the last 72 hours. Invalid input(s): POCBNP No results for input(s): HGBA1C in the last 72 hours.   Radiology/Studies:     Assessment and Plan  58M with permanent atrial fib, CAD (remote MI 2000 at [facility] [place] to M1 2008 per chart, cath 09/2010 with patent Cx stent, chronically occluded RCA, 50-70% ostial diag lesions treated medically), chronic combined CHF, HTN, PVD, cerebrovascular disease s/p prior carotid stent. PV angio 05/2015:  occluded right renal artery, possible source of pseudoaneurysm on left lateral side of aorta just below the renal artery, calcific stenosis of R>L BIA, diseased suprarenal aorta. Admitted 06/09/15 for planned vascular surgery with subsequent repair of juxtarenal abdominal aortic aneurysm, left renal artery bypass. Post-op couse notable for post-op respiratory failure requiring intubation, renal failure with anuria & hyperkalemia requiring initiation of CVVH, serratia in sputum with concern for developing HCAP, possible ileus and anemia/thrombocytopenia.  1. S/p juxtarenal abdominal aortic aneurysm, left renal artery bypass 06/09/15 with above post-op complications - per  vascular/PCCM.  2. Permanent atrial fibrillation - rate now better controlled; still NPO, but is having feeding tube placed.  Can convert to PO BB & stop Amiodarone once able to feed  D/c Amiodarone - increase IV Lopressor to 5 mg q6r (hope to convert to PO/PerTube once able).  If rates icrease, go back on Amiodarone.  Resume anticoag when OK with vascular surgery - probably wait for IR tunneled catheter; can simply restart without bridge  3. Acute on chronic combined CHF with volume overload mostly due to acute renal failure on CKD -   management of volume per nephrology.   Not a candidate for ACEI/ARB right now with renal dysfunction. EF 35-40%, c/w prior MRI in 2012.   Change BB to Toprol rather than Lopressor at d/c given his LV dysfunction.  4. CAD - no active angina this admission. As above, will review resumption of BB with MD. Not currently taking POs. Can resume statin upon discharge. Not on ASA prior to admission, question due to concomitant warfarin use.    Signed,  Leonie Man, M.D., M.S. Interventional Cardiologist   Pager # [phone] Phone # [phone] [address] [place], [place] [address]

## 2015-06-21 LAB — RENAL FUNCTION PANEL
Albumin: 1.7 g/dL — ABNORMAL LOW (ref 3.5–5.0)
Anion gap: 13 (ref 5–15)
BUN: 63 mg/dL — ABNORMAL HIGH (ref 6–20)
CO2: 24 mmol/L (ref 22–32)
Calcium: 8.1 mg/dL — ABNORMAL LOW (ref 8.9–10.3)
Chloride: 92 mmol/L — ABNORMAL LOW (ref 101–111)
Creatinine, Ser: 4.76 mg/dL — ABNORMAL HIGH (ref 0.61–1.24)
GFR calc Af Amer: 14 mL/min — ABNORMAL LOW (ref >60)
GFR calc non Af Amer: 12 mL/min — ABNORMAL LOW (ref >60)
Glucose, Bld: 135 mg/dL — ABNORMAL HIGH (ref 65–99)
Phosphorus: 4.6 mg/dL (ref 2.5–4.6)
Potassium: 3.9 mmol/L (ref 3.5–5.1)
Sodium: 129 mmol/L — ABNORMAL LOW (ref 135–145)

## 2015-06-21 LAB — GLUCOSE, CAPILLARY
Glucose-Capillary: 120 mg/dL — ABNORMAL HIGH (ref 65–99)
Glucose-Capillary: 133 mg/dL — ABNORMAL HIGH (ref 65–99)
Glucose-Capillary: 123 mg/dL — ABNORMAL HIGH (ref 65–99)
Glucose-Capillary: 119 mg/dL — ABNORMAL HIGH (ref 65–99)
Glucose-Capillary: 124 mg/dL — ABNORMAL HIGH (ref 65–99)

## 2015-06-21 LAB — CBC
HCT: 23.7 % — ABNORMAL LOW (ref 39.0–52.0)
Hemoglobin: 8 g/dL — ABNORMAL LOW (ref 13.0–17.0)
MCH: 27.5 pg (ref 26.0–34.0)
MCHC: 33.8 g/dL (ref 30.0–36.0)
MCV: 81.4 fL (ref 78.0–100.0)
Platelets: 196 10*3/uL (ref 150–400)
RBC: 2.91 MIL/uL — ABNORMAL LOW (ref 4.22–5.81)
RDW: 16.1 % — ABNORMAL HIGH (ref 11.5–15.5)
WBC: 13.4 10*3/uL — ABNORMAL HIGH (ref 4.0–10.5)

## 2015-06-21 LAB — MAGNESIUM: Magnesium: 1.8 mg/dL (ref 1.7–2.4)

## 2015-06-21 LAB — PREPARE RBC (CROSSMATCH)

## 2015-06-21 MED ORDER — MORPHINE SULFATE 2 MG/ML IV SOLN
INTRAVENOUS | Status: DC
  Administered 2015-06-21: 28.5 mg via INTRAVENOUS
  Administered 2015-06-21 (×2): via INTRAVENOUS
  Administered 2015-06-21: 13.5 mg via INTRAVENOUS
  Administered 2015-06-22: 8.7 mg via INTRAVENOUS
  Administered 2015-06-22: 7.5 mg via INTRAVENOUS
  Administered 2015-06-22: 13.5 mg via INTRAVENOUS
  Administered 2015-06-22: 19:00:00 via INTRAVENOUS
  Administered 2015-06-22 (×2): 4.5 mg via INTRAVENOUS
  Administered 2015-06-22: 3 mg via INTRAVENOUS
  Administered 2015-06-23: 15 mg via INTRAVENOUS
  Administered 2015-06-23: 4.5 mg via INTRAVENOUS
  Administered 2015-06-23: 3 mg via INTRAVENOUS
  Administered 2015-06-24: 4.5 mg via INTRAVENOUS
  Administered 2015-06-24: 16.5 mg via INTRAVENOUS
  Administered 2015-06-24: 03:00:00 via INTRAVENOUS
  Administered 2015-06-24: 2 mg via INTRAVENOUS
  Administered 2015-06-24: 7.5 mg via INTRAVENOUS
  Administered 2015-06-25 (×2): 1.5 mg via INTRAVENOUS
  Filled 2015-06-21 (×4): qty 25

## 2015-06-21 MED ORDER — TRACE MINERALS CR-CU-MN-SE-ZN 10-1000-500-60 MCG/ML IV SOLN
INTRAVENOUS | Status: AC
  Administered 2015-06-21: 18:00:00 via INTRAVENOUS
  Filled 2015-06-21: qty 2760

## 2015-06-21 MED ORDER — TRACE MINERALS CR-CU-MN-SE-ZN 10-1000-500-60 MCG/ML IV SOLN
INTRAVENOUS | Status: DC
  Filled 2015-06-21: qty 2760

## 2015-06-21 MED ORDER — CHLORHEXIDINE GLUCONATE 0.12 % MT SOLN: 15.0000 mL | Freq: Two times a day (BID) | OROMUCOSAL | Status: DC

## 2015-06-21 MED ORDER — DIPHENHYDRAMINE HCL 12.5 MG/5ML PO ELIX: 12.5000 mg | ORAL_SOLUTION | Freq: Four times a day (QID) | ORAL | Status: DC | PRN

## 2015-06-21 MED ORDER — DIPHENHYDRAMINE HCL 50 MG/ML IJ SOLN: 12.5000 mg | Freq: Four times a day (QID) | INTRAMUSCULAR | Status: DC | PRN

## 2015-06-21 MED ORDER — NALOXONE HCL 0.4 MG/ML IJ SOLN: 0.4000 mg | INTRAMUSCULAR | Status: DC | PRN

## 2015-06-21 MED ORDER — POTASSIUM CHLORIDE 10 MEQ/50ML IV SOLN
10.0000 meq | INTRAVENOUS | Status: AC
  Administered 2015-06-21 (×2): 10 meq via INTRAVENOUS
  Filled 2015-06-21 (×2): qty 50

## 2015-06-21 MED ORDER — CETYLPYRIDINIUM CHLORIDE 0.05 % MT LIQD: 7.0000 mL | Freq: Two times a day (BID) | OROMUCOSAL | Status: DC

## 2015-06-21 MED ORDER — MAGNESIUM SULFATE 2 GM/50ML IV SOLN
2.0000 g | Freq: Once | INTRAVENOUS | Status: AC
  Administered 2015-06-21: 2 g via INTRAVENOUS
  Filled 2015-06-21: qty 50

## 2015-06-21 MED ORDER — ONDANSETRON HCL 4 MG/2ML IJ SOLN
4.0000 mg | Freq: Four times a day (QID) | INTRAMUSCULAR | Status: DC | PRN
  Administered 2015-06-21: 4 mg via INTRAVENOUS
  Filled 2015-06-21: qty 2

## 2015-06-21 MED ORDER — SODIUM CHLORIDE 0.9 % IV SOLN: Freq: Once | INTRAVENOUS | Status: AC

## 2015-06-21 MED ORDER — SODIUM CHLORIDE 0.9% FLUSH
9.0000 mL | INTRAVENOUS | Status: DC | PRN
  Administered 2015-06-23 – 2015-06-24 (×2): 9 mL via INTRAVENOUS
  Filled 2015-06-21 (×2): qty 9

## 2015-06-21 NOTE — Progress Notes (Signed)
   Daily Progress Note  Assessment/Planning: POD #12 s/p Repair of juxtarenal AAA, Left renal bypass, ESRD   Did not tolerate trophic tube feeds -> nausea  Keep on TPN for now.  Recheck pancreas labs in AM  Unclear if pt got transfused order not  Recheck H/H in AM  Ok to go to 3S  Subjective  - 12 Days Post-Op  Continue abd pain, +BM: diarrhea  Objective Filed Vitals:   06/21/15 0600 06/21/15 0700 06/21/15 0723 06/21/15 0800  BP: 120/52 116/57  120/56  Pulse: 79 82  82  Temp:   98.4 F (36.9 C)   TempSrc:   Oral   Resp: 19 24  18   Height:      Weight:      SpO2: 95% 96%  96%    Intake/Output Summary (Last 24 hours) at 06/21/15 0844 Last data filed at 06/21/15 0800  Gross per 24 hour  Intake 2086.87 ml  Output   1800 ml  Net 286.87 ml    PULM  BLL rales CV  RRR GI  soft, appropriate TTP, -G/R, staples in place VASC  Viable feet  Laboratory CBC    Component Value Date/Time   WBC 13.4* 06/21/2015 0440   HGB 8.0* 06/21/2015 0440   HCT 23.7* 06/21/2015 0440   PLT 196 06/21/2015 0440    BMET    Component Value Date/Time   NA 129* 06/21/2015 0440   K 3.9 06/21/2015 0440   CL 92* 06/21/2015 0440   CO2 24 06/21/2015 0440   GLUCOSE 135* 06/21/2015 0440   BUN 63* 06/21/2015 0440   CREATININE 4.76* 06/21/2015 0440   CREATININE 1.10 11/07/2014 0804   CALCIUM 8.1* 06/21/2015 0440   GFRNONAA 12* 06/21/2015 0440   GFRNONAA 53* 05/14/2013 0905   GFRAA 14* 06/21/2015 [place] 05/14/2013 [place], MD [facility] [place]: [phone] Pager: [phone]  06/21/2015, 8:44 AM

## 2015-06-21 NOTE — Progress Notes (Signed)
PARENTERAL NUTRITION CONSULT NOTE  Pharmacy Consult:  TPN Indication:  Prolonged ileus  No Known Allergies  Patient Measurements:  Height: 6' (182.9 cm) Weight: 190 lb 7.6 oz (86.4 kg) IBW/kg (Calculated) : 77.6  Vital Signs: Temp: 100.7 F (38.2 C) (02/18 0115) Temp Source: Axillary (02/18 0115) BP: 120/52 mmHg (02/18 0600) Pulse Rate: 79 (02/18 0600) Intake/Output from previous day: 02/17 0701 - 02/18 0700 In: 2012.6 [I.V.:200; Blood:335; NG/GT:120; TPN:1357.6] Out: 1800   Labs:  Recent Labs  06/19/15 0803 06/20/15 0336 06/21/15 0440  WBC 12.9* 15.0* 13.4*  HGB 8.0* 8.0* 8.0*  HCT 23.9* 23.1* 23.7*  PLT 152 181 196     Recent Labs  06/19/15 0341 06/20/15 0335 06/20/15 0336 06/21/15 0440  NA 132* 126*  --  129*  K 3.7 4.6  --  3.9  CL 96* 91*  --  92*  CO2 22 17*  --  24  GLUCOSE 151* 162*  --  135*  BUN 82* 129*  --  63*  CREATININE 4.79* 7.08*  --  4.76*  CALCIUM 8.6* 8.9  --  8.1*  MG 2.0  --  2.0 1.8  PHOS 4.7* 7.5*  --  4.6  PROT 5.5*  --   --   --   ALBUMIN 1.9* 1.8*  --  1.7*  AST 23  --   --   --   ALT 37  --   --   --   ALKPHOS 108  --   --   --   BILITOT 0.5  --   --   --    Estimated Creatinine Clearance: 17.7 mL/min (by C-G formula based on Cr of 4.76).    Recent Labs  06/20/15 1140 06/20/15 2052 06/21/15 0112  GLUCAP 110* 150* 120*     Insulin Requirements in the past 24 hours:  2 units SSI (TPN bag held due to CaXphos product for most of the day)  Assessment: 11 YOM with history of occluded right renal artery discovered on 05/28/15 presented on 06/09/15 for AAA repair, left renal artery bypass and aortic endarterectomy.  Post-op course was complicated by respiratory failure requiring intubation and AKI requiring dialysis.  Pharmacy consulted to initiate TPN for prolonged ileus on 06/13/15.  GI: GERD - Albumin low at 1.7 and prealbumin low at 11.2. Advanced to full liquids but appetite is poor d/t intermittent nausea.  Stool x3  yesterday. Emesis x 1. Vascular surgery placed post pyloric TFs on 2/17. Did not seem to tolerate as patient has a lot of nausea and retching. Holding all TFs for now per surgery. PPI IV  Endo: TSH WNL.  No hx of DM - CBGs well controlled with SSI  Lytes: alternating days with and without electrolytes in TPN but Phos seems to continue to bounce back up each time. Na 129, Cl 92. K ok at 3.9 (goal > 4) Mg low at 1.8 (goal > 2) Phos back down to 4.6, CoCa down to 9.9 (Ca x Phos = 45.5)  Renal: AKI s/p HD x1 on 2/8, then CRRT 2/9 >> 2/14, iHD resumed on 2/15, again today (BL SCr 1.2), BUN back down to 63 today after peaking at 129.  Tunneled dialysis cath next week.  Pulm: hx tobacco use, intubated 2/10, currently on RA  Cards: ASCVD / HTN / Afib / PVD / HLD - VSS - Lopressor IV, amiodarone gtt  Hepatobil: amylase elevated at 225, LFTs normalized, tbili WNL.  TG 449 > 266   AC/Heme:  Coumadin for Afib stopped 2/1 PTA - HIT Ab negative - hgb 8, and plts recovered to 181 - to be transfused with dialysis  Neuro: hx CVA.  Frequent usage of PRN Fentanyl  ID: s/p tx for HCAP.Spiked a slight fever today of 100.7, WBC down to 13.4, PCT 5.29  Zosyn 2/10 >> 2/13 Vanc 2/10 >> 2/13 CTX 2/13 >> 2/16  2/10 endotracheal cx - Serratia (sensitive to CTX, Septra, Cipro)  Best Practices: Lovenox, MC  TPN Access: CVC triple lumen placed 06/13/15  TPN start date: 2/10 >>  Current Nutrition:  Full liquid diet (eating <5% of meals) Not tolerating TFs (were discontinued per MD) TPN + IVFE on MWF  Nutritional Goals: per RD 2/17 KCal: 2100-2300 Protein: 135-145 g Fluid: per MD   Plan:  Increase Clinimix 5/15 (w/o lytes) back up to 183ml/hr Continue 20% IVFE at 11ml/hr on MWF TPN, and IVFE provides a daily average of 138 g of protein and 2166 kcals meeting 100% of protein and kcal needs Continue MVI and trace elements in TPN Continue moderate SSI and adjust as needed Monitor TPN labs, BUN, Bmet, Mg,  Phos F/U restart of TFs when able  Give Mg 2g IV x 1 today Give 60mEq of KCl x 2 runs today  Elenor Quinones, PharmD, [facility] Clinical Pharmacist Pager [phone] 06/21/2015 7:15 AM

## 2015-06-21 NOTE — Progress Notes (Signed)
Assessment/ Plan:   1. AKI due to ATN: Anuric--hemodynamically mediated( a chronically occluded R kidney with a pseudoaneurysm on the left and significant hypovolemia/hypotension intra-operatively and post-operatively with an estimated blood loss of 4L) (Cr 1.21 on 04/09/15). S/p CVVHD HD again Monday             Subjective: Interval History: No UOP  Objective: Vital signs in last 24 hours: Temp:  [97.7 F (36.5 C)-100.7 F (38.2 C)] 98.1 F (36.7 C) (02/18 1128) Pulse Rate:  [73-112] 77 (02/18 1000) Resp:  [13-26] 21 (02/18 1115) BP: (60-161)/(45-103) 122/52 mmHg (02/18 1000) SpO2:  [94 %-100 %] 97 % (02/18 1116) Weight:  [86.2 kg (190 lb 0.6 oz)-87 kg (191 lb 12.8 oz)] 86.4 kg (190 lb 7.6 oz) (02/18 0500) Weight change: 0 kg (0 lb)  Intake/Output from previous day: 02/17 0701 - 02/18 0700 In: 2105.6 [I.V.:200; Blood:335; NG/GT:120; TPN:1450.6] Out: 1800  Intake/Output this shift: Total I/O In: 602.5 [I.V.:17.5; NG/GT:20; IV Piggyback:100; TPN:465] Out: -   General appearance: alert and cooperative Resp: clear to auscultation bilaterally Chest wall: no tenderness Cardio: regular rate and rhythm, S1, S2 normal, no murmur, click, rub or gallop Extremities: extremities normal, atraumatic, no cyanosis or edema  Lab Results:  Recent Labs  06/20/15 0336 06/21/15 0440  WBC 15.0* 13.4*  HGB 8.0* 8.0*  HCT 23.1* 23.7*  PLT 181 196   BMET:  Recent Labs  06/20/15 0335 06/21/15 0440  NA 126* 129*  K 4.6 3.9  CL 91* 92*  CO2 17* 24  GLUCOSE 162* 135*  BUN 129* 63*  CREATININE 7.08* 4.76*  CALCIUM 8.9 8.1*   No results for input(s): PTH in the last 72 hours. Iron Studies: No results for input(s): IRON, TIBC, TRANSFERRIN, FERRITIN in the last 72 hours. Studies/Results: Dg Abd 1 View  06/20/2015  CLINICAL DATA:  Post abdominal aortic aneurysm repair and LEFT renal artery bypass, leukocytosis, history smoking, hypertension, atrial fibrillation, coronary artery  disease post MI, testicular cancer EXAM: ABDOMEN - 1 VIEW COMPARISON:  Portable exam [id number] hours compared to 06/16/2015 FINDINGS: Post laparotomy. Normal bowel gas pattern. No bowel dilatation or bowel wall thickening. Osseous structures normal. No urinary tract calcification. Visualized lung bases clear. IMPRESSION: Normal bowel gas pattern. Electronically Signed   By: Lavonia Dana M.D.   On: 06/20/2015 08:19   Dg Chest Port 1 View  06/20/2015  CLINICAL DATA:  Respiratory failure, abdominal aortic aneurysm repair 11 days ago EXAM: PORTABLE CHEST 1 VIEW COMPARISON:  Portable chest x-ray of June 15, 2015 FINDINGS: The lungs are adequately inflated and clear the endotracheal tube and esophagogastric tube have been removed. The heart is mildly enlarged but stable. The pulmonary vascularity is not engorged. The mediastinum is normal in width. There is no significant pleural effusion demonstrated. The bilateral internal jugular catheters have their tips overlying the proximal SVC. IMPRESSION: Interval extubation of the trachea. Top-normal cardiac size without pulmonary vascular congestion. There is no evidence of pneumonia or significant atelectasis. Electronically Signed   By: David  Martinique M.D.   On: 06/20/2015 07:14   Dg Abd Portable 1v  06/20/2015  CLINICAL DATA:  Feeding tube placement EXAM: PORTABLE ABDOMEN - 1 VIEW COMPARISON:  Study obtained earlier in the day FINDINGS: Feeding tube tip in region of second portion of duodenum. Bowel gas pattern unremarkable. There are skin staples in the midline. Lung bases appear clear. IMPRESSION: Feeding tube tip in region of second portion of duodenum. Bowel gas pattern unremarkable. Electronically Signed  By: Lowella Grip III M.D.   On: 06/20/2015 11:00    Scheduled: . alteplase  2 mg Intracatheter Once  . antiseptic oral rinse  7 mL Mouth Rinse q12n4p  . chlorhexidine  15 mL Mouth Rinse BID  . enoxaparin (LOVENOX) injection  30 mg Subcutaneous Q24H  .  insulin aspart  0-15 Units Subcutaneous 6 times per day  . metoprolol  5 mg Intravenous 4 times per day  . morphine   Intravenous 6 times per day  . pantoprazole (PROTONIX) IV  40 mg Intravenous Q24H  . sodium chloride flush  10-40 mL Intracatheter Q12H    LOS: 12 days   Emmery Seiler C 06/21/2015,3:00 PM

## 2015-06-21 NOTE — Progress Notes (Signed)
SUBJECTIVE: Abdomen is a "little better" per patient.  At this time, he denies chest pain, shortness of breath, or any new concerns.  [name] alteplase  2 mg Intracatheter Once  . antiseptic oral rinse  7 mL Mouth Rinse q12n4p  . chlorhexidine  15 mL Mouth Rinse BID  . enoxaparin (LOVENOX) injection  30 mg Subcutaneous Q24H  . insulin aspart  0-15 Units Subcutaneous 6 times per day  . magnesium sulfate 1 - 4 g bolus IVPB  2 g Intravenous Once  . metoprolol  5 mg Intravenous 4 times per day  . pantoprazole (PROTONIX) IV  40 mg Intravenous Q24H  . potassium chloride  10 mEq Intravenous Q1 Hr x 2  . sodium chloride flush  10-40 mL Intracatheter Q12H   . sodium chloride Stopped (06/18/15 1300)  . TPN (CLINIMIX) Adult without lytes 83 mL/hr at 06/20/15 2100   And  . fat emulsion 240 mL (06/20/15 2100)  . feeding supplement (VITAL AF 1.2 CAL)    . phenylephrine (NEO-SYNEPHRINE) Adult infusion Stopped (06/14/15 1100)  . TPN (CLINIMIX) Adult without lytes      OBJECTIVE: Physical Exam: Filed Vitals:   06/21/15 0600 06/21/15 0700 06/21/15 0723 06/21/15 0800  BP: 120/52 116/57  120/56  Pulse: 79 82  82  Temp:   98.4 F (36.9 C)   TempSrc:   Oral   Resp: 19 24  18   Height:      Weight:      SpO2: 95% 96%  96%    Intake/Output Summary (Last 24 hours) at 06/21/15 1017 Last data filed at 06/21/15 0800  Gross per 24 hour  Intake 1827.3 ml  Output   1800 ml  Net   27.3 ml    Telemetry reveals rate controlled afib (V rates 80s)  GEN- The patient is ill appearing, alert and oriented x 3 today.   Head- normocephalic, atraumatic Eyes-  Sclera clear, conjunctiva pink Ears- hearing intact Oropharynx- NGT Neck- supple,   Lungs- Clear to ausculation bilaterally, normal work of breathing Heart- irregular rate and rhythm  GI- + distended, + BS Extremities- no clubbing, cyanosis, + dependant edema Skin- no rash or lesion Psych- euthymic mood, full affect Neuro- strength and sensation  are intact  LABS: Basic Metabolic Panel:  Recent Labs  06/20/15 0335 06/20/15 0336 06/21/15 0440  NA 126*  --  129*  K 4.6  --  3.9  CL 91*  --  92*  CO2 17*  --  24  GLUCOSE 162*  --  135*  BUN 129*  --  63*  CREATININE 7.08*  --  4.76*  CALCIUM 8.9  --  8.1*  MG  --  2.0 1.8  PHOS 7.5*  --  4.6   Liver Function Tests:  Recent Labs  06/19/15 0341 06/20/15 0335 06/21/15 0440  AST 23  --   --   ALT 37  --   --   ALKPHOS 108  --   --   BILITOT 0.5  --   --   PROT 5.5*  --   --   ALBUMIN 1.9* 1.8* 1.7*    Recent Labs  06/20/15 1035  LIPASE 258*  AMYLASE 156*   CBC:  Recent Labs  06/20/15 0336 06/21/15 0440  WBC 15.0* 13.4*  HGB 8.0* 8.0*  HCT 23.1* 23.7*  MCV 82.2 81.4  PLT 181 196    ASSESSMENT AND PLAN:   9M with permanent atrial fib, CAD (remote MI 2000 at [facility],  DES to M1 2008 per chart, cath 09/2010 with patent Cx stent, chronically occluded RCA, 50-70% ostial diag lesions treated medically), chronic combined CHF, HTN, PVD, cerebrovascular disease s/p prior carotid stent. PV angio 05/2015: occluded right renal artery, possible source of pseudoaneurysm on left lateral side of aorta just below the renal artery, calcific stenosis of R>L BIA, diseased suprarenal aorta. Admitted 06/09/15 for planned vascular surgery with subsequent repair of juxtarenal abdominal aortic aneurysm, left renal artery bypass. Post-op couse notable for post-op respiratory failure requiring intubation, renal failure with anuria & hyperkalemia requiring initiation of CVVH, serratia in sputum with concern for developing HCAP, possible ileus and anemia/thrombocytopenia.  1. S/p juxtarenal abdominal aortic aneurysm, left renal artery bypass 06/09/15 with above post-op complications - per vascular/PCCM.  2. Permanent atrial fibrillation - Rate controlled No changes at this time Resume anticoag when OK with vascular surgery - probably wait for IR tunneled catheter; can simply restart  without bridge  3. Acute on chronic combined CHF with volume overload mostly due to acute renal failure on CKD -  management of volume per nephrology.  Not a candidate for ACEI/ARB right now with renal dysfunction. EF 35-40%, c/w prior MRI in 2012.  Change BB to Toprol rather than Lopressor at d/c given his LV dysfunction.  4. CAD - no active angina this admission.  Can resume statin upon discharge.    Cardiology to see as needed over the weekend   Thompson Grayer, MD 06/21/2015 10:17 AM

## 2015-06-22 LAB — CBC
HCT: 25.5 % — ABNORMAL LOW (ref 39.0–52.0)
Hemoglobin: 8.6 g/dL — ABNORMAL LOW (ref 13.0–17.0)
MCH: 27.7 pg (ref 26.0–34.0)
MCHC: 33.7 g/dL (ref 30.0–36.0)
MCV: 82 fL (ref 78.0–100.0)
Platelets: 242 10*3/uL (ref 150–400)
RBC: 3.11 MIL/uL — ABNORMAL LOW (ref 4.22–5.81)
RDW: 16 % — ABNORMAL HIGH (ref 11.5–15.5)
WBC: 12.3 10*3/uL — ABNORMAL HIGH (ref 4.0–10.5)

## 2015-06-22 LAB — GLUCOSE, CAPILLARY
Glucose-Capillary: 111 mg/dL — ABNORMAL HIGH (ref 65–99)
Glucose-Capillary: 115 mg/dL — ABNORMAL HIGH (ref 65–99)
Glucose-Capillary: 116 mg/dL — ABNORMAL HIGH (ref 65–99)
Glucose-Capillary: 126 mg/dL — ABNORMAL HIGH (ref 65–99)
Glucose-Capillary: 128 mg/dL — ABNORMAL HIGH (ref 65–99)
Glucose-Capillary: 138 mg/dL — ABNORMAL HIGH (ref 65–99)

## 2015-06-22 LAB — TYPE AND SCREEN
ABO/RH(D): B NEG
Antibody Screen: NEGATIVE
Unit division: 0
Unit division: 0

## 2015-06-22 LAB — BASIC METABOLIC PANEL
Anion gap: 15 (ref 5–15)
BUN: 108 mg/dL — ABNORMAL HIGH (ref 6–20)
CO2: 19 mmol/L — ABNORMAL LOW (ref 22–32)
Calcium: 8.4 mg/dL — ABNORMAL LOW (ref 8.9–10.3)
Chloride: 91 mmol/L — ABNORMAL LOW (ref 101–111)
Creatinine, Ser: 7.23 mg/dL — ABNORMAL HIGH (ref 0.61–1.24)
GFR calc Af Amer: 8 mL/min — ABNORMAL LOW (ref >60)
GFR calc non Af Amer: 7 mL/min — ABNORMAL LOW (ref >60)
Glucose, Bld: 125 mg/dL — ABNORMAL HIGH (ref 65–99)
Potassium: 4 mmol/L (ref 3.5–5.1)
Sodium: 125 mmol/L — ABNORMAL LOW (ref 135–145)

## 2015-06-22 LAB — MAGNESIUM: Magnesium: 2.4 mg/dL (ref 1.7–2.4)

## 2015-06-22 LAB — AMYLASE: Amylase: 113 U/L — ABNORMAL HIGH (ref 28–100)

## 2015-06-22 LAB — PHOSPHORUS: Phosphorus: 7.6 mg/dL — ABNORMAL HIGH (ref 2.5–4.6)

## 2015-06-22 LAB — LIPASE, BLOOD: Lipase: 130 U/L — ABNORMAL HIGH (ref 11–51)

## 2015-06-22 MED ORDER — TRACE MINERALS CR-CU-MN-SE-ZN 10-1000-500-60 MCG/ML IV SOLN
INTRAVENOUS | Status: AC
  Administered 2015-06-22: 17:00:00 via INTRAVENOUS
  Filled 2015-06-22: qty 2760

## 2015-06-22 NOTE — Progress Notes (Signed)
PARENTERAL NUTRITION CONSULT NOTE  Pharmacy Consult:  TPN Indication:  Prolonged ileus  No Known Allergies  Patient Measurements:  Height: 6' (182.9 cm) Weight: 184 lb 12.8 oz (83.825 kg) IBW/kg (Calculated) : 77.6  Vital Signs: Temp: 98.3 F (36.8 C) (02/19 0345) Temp Source: Oral (02/19 0345) BP: 145/59 mmHg (02/19 0700) Pulse Rate: 91 (02/19 0700) Intake/Output from previous day: 02/18 0701 - 02/19 0700 In: 2137.7 [I.V.:111.8; Blood:282.5; NG/GT:40; IV Piggyback:100; TPN:1603.4] Out: -   Labs:  Recent Labs  06/20/15 0336 06/21/15 0440 06/22/15 0350  WBC 15.0* 13.4* 12.3*  HGB 8.0* 8.0* 8.6*  HCT 23.1* 23.7* 25.5*  PLT 181 196 242     Recent Labs  06/20/15 0335 06/20/15 0336 06/21/15 0440 06/22/15 0350  NA 126*  --  129* 125*  K 4.6  --  3.9 4.0  CL 91*  --  92* 91*  CO2 17*  --  24 19*  GLUCOSE 162*  --  135* 125*  BUN 129*  --  63* 108*  CREATININE 7.08*  --  4.76* 7.23*  CALCIUM 8.9  --  8.1* 8.4*  MG  --  2.0 1.8 2.4  PHOS 7.5*  --  4.6 7.6*  ALBUMIN 1.8*  --  1.7*  --    Estimated Creatinine Clearance: 11.6 mL/min (by C-G formula based on Cr of 7.23).    Recent Labs  06/21/15 1907 06/21/15 2331 06/22/15 0344  GLUCAP 124* 128* 138*     Insulin Requirements in the past 24 hours:  10 units SSI   Assessment: 56 YOM with history of occluded right renal artery discovered on 05/28/15 presented on 06/09/15 for AAA repair, left renal artery bypass and aortic endarterectomy.  Post-op course was complicated by respiratory failure requiring intubation and AKI requiring dialysis.  Pharmacy consulted to initiate TPN for prolonged ileus on 06/13/15.  GI: GERD - Albumin low at 1.7 and prealbumin low at 11.2. Advanced to full liquids but appetite is poor d/t intermittent nausea.  Stool x 1 yesterday. No emesis. Vascular surgery placed post pyloric TFs on 2/17. Did not seem to tolerate as patient has a lot of nausea and retching. Holding all TFs for now per  surgery. PPI IV  Endo: TSH WNL.  No hx of DM - CBGs well controlled (< 150) with SSI  Lytes: TPN with NO lytes. wnl exc Na low at 125, Phos up to 7.6. K (goal > 4) and Mg (goal > 2) ok after replacement yesterday. CoCa down to 10.2 (Ca x Phos = 77.5 Although there are no lytes in the TPN bag so will not need to hold)  Renal: AKI s/p HD x1 on 2/8, then CRRT 2/9 >> 2/14, iHD resumed on 2/15, again today (BL SCr 1.2), BUN back up to 108. Watch BUN closely as may need to decrease protein intake again. Plan for HD on 2/20 which should help. Tunneled dialysis cath next week.  Pulm: hx tobacco use, intubated 2/10, currently on RA  Cards: ASCVD / HTN / Afib / PVD / HLD - VSS - Lopressor IV, amiodarone gtt  Hepatobil: amylase elevated at 225, LFTs normalized, tbili WNL.  TG 449 > 266   AC/Heme: Coumadin for Afib stopped 2/1 PTA - HIT Ab negative - hgb 8, and plts recovered to 181 - to be transfused with dialysis  Neuro: hx CVA.  Frequent usage of PRN Fentanyl  ID: s/p tx for HCAP.Spiked a slight fever today of 100.7, WBC down to 12.3, PCT 5.29  Zosyn 2/10 >> 2/13 Vanc 2/10 >> 2/13 CTX 2/13 >> 2/16  2/10 endotracheal cx - Serratia (sensitive to CTX, Septra, Cipro)  Best Practices: Lovenox, MC  TPN Access: CVC triple lumen placed 06/13/15  TPN start date: 2/10 >>  Current Nutrition:  Full liquid diet (eating <5% of meals) Not tolerating TFs (discontinued per MD) TPN + IVFE on MWF TPN + IVFE provides a daily average of 138g of protein and 2166 kcal meeting 100% of patient needs  Nutritional Goals: per RD 2/17 KCal: 2100-2300 Protein: 135-145 g Fluid: per MD   Plan:  Continue Clinimix 5/15 (w/o lytes) at 173ml/hr Continue 20% IVFE at 74ml/hr on MWF TPN, and IVFE provides a daily average of 138 g of protein and 2166 kcals meeting 100% of protein and kcal needs Continue MVI and trace elements in TPN Continue moderate SSI and adjust as needed Monitor TPN labs, BUN F/U restart of TFs  when able  Elenor Quinones, PharmD, BCPS Clinical Pharmacist Pager [phone] 06/22/2015 7:14 AM

## 2015-06-22 NOTE — Progress Notes (Signed)
Patient rated his pain as 1/10 and pushed the PCA pain button. Explained the patient about the PCA pain medication and to push the PCA button for moderate pain, not for pain 1/10. Respiration rate falls to 10 when sleeping and also when he pushed the PCA button. Ambulated 200 feet this morning. Will continue to monitor PCA compliance and respiration rate and educate as needed.

## 2015-06-22 NOTE — Progress Notes (Signed)
Per RN, Pt walking very well around the RN station.  PT to work with Pt again for d/c recommendations.  Bernita Raisin, [place] [facility] [phone]

## 2015-06-22 NOTE — Progress Notes (Signed)
Assessment/ Plan:   1. AKI due to ATN: Anuric--hemodynamically mediated AAA repair and LRABPG( a chronically occluded R kidney and significant hypovolemia/hypotension intra-operatively and post-operatively with an estimated blood loss of 4L) (Cr 1.21 on 04/09/15). S/p CVVHD,  2. Hyponatremia due to hypotonic TNA HD again Monday.  Pt will need a TDC placed by [facility] and if remains anuric will need arrangements for outpt HD for AKI. WIll ask pharmacy to add electrolytes to TNA due to hyponatremia                 Subjective: Interval History: Remains anuric  Objective: Vital signs in last 24 hours: Temp:  [97.4 F (36.3 C)-98.9 F (37.2 C)] 98.9 F (37.2 C) (02/19 1518) Pulse Rate:  [72-103] 82 (02/19 1520) Resp:  [9-19] 11 (02/19 1520) BP: (108-151)/(39-76) 119/63 mmHg (02/19 1520) SpO2:  [88 %-100 %] 94 % (02/19 1520) Weight:  [83.825 kg (184 lb 12.8 oz)] 83.825 kg (184 lb 12.8 oz) (02/19 0600) Weight change: -3.175 kg (-7 lb)  Intake/Output from previous day: 02/18 0701 - 02/19 0700 In: 3217.7 [I.V.:271.8; Blood:282.5; NG/GT:40; IV Piggyback:100; TPN:2523.4] Out: -  Intake/Output this shift: Total I/O In: 1255 [I.V.:200; NG/GT:20; TPN:1035] Out: -   General appearance: alert and cooperative Chest wall: no tenderness, left neck temp cath Cardio: regular rate and rhythm, S1, S2 normal, no murmur, click, rub or gallop GI: soft, non-tender; bowel sounds normal; no masses,  no organomegaly Extremities: extremities normal, atraumatic, no cyanosis or edema  Lab Results:  Recent Labs  06/21/15 0440 06/22/15 0350  WBC 13.4* 12.3*  HGB 8.0* 8.6*  HCT 23.7* 25.5*  PLT 196 242   BMET:  Recent Labs  06/21/15 0440 06/22/15 0350  NA 129* 125*  K 3.9 4.0  CL 92* 91*  CO2 24 19*  GLUCOSE 135* 125*  BUN 63* 108*  CREATININE 4.76* 7.23*  CALCIUM 8.1* 8.4*   No results for input(s): PTH in the last 72 hours. Iron Studies: No results for input(s): IRON, TIBC,  TRANSFERRIN, FERRITIN in the last 72 hours. Studies/Results: No results found.  Scheduled: . alteplase  2 mg Intracatheter Once  . antiseptic oral rinse  7 mL Mouth Rinse q12n4p  . chlorhexidine  15 mL Mouth Rinse BID  . enoxaparin (LOVENOX) injection  30 mg Subcutaneous Q24H  . insulin aspart  0-15 Units Subcutaneous 6 times per day  . metoprolol  5 mg Intravenous 4 times per day  . morphine   Intravenous 6 times per day  . pantoprazole (PROTONIX) IV  40 mg Intravenous Q24H  . sodium chloride flush  10-40 mL Intracatheter Q12H     LOS: 13 days   Evi Mccomb C 06/22/2015,3:38 PM

## 2015-06-22 NOTE — Progress Notes (Addendum)
   Daily Progress Note  Assessment/Planning: POD #13 s/p Repair of juxtarenal AAA, Left renal bypass, ESRD, pancreatitis improving, ARF   Stable H/H  Continue TPN  Will pull feeding tube  Starting to look like permanent ESRD  May need TDC sometime this week  Ok to go to 3S  Subjective  - 13 Days Post-Op  Greatly improved abd pain, +Flatus  Objective Filed Vitals:   06/22/15 0600 06/22/15 0700 06/22/15 0723 06/22/15 0800  BP: 151/70 145/59  142/62  Pulse:  91  90  Temp:   98.5 F (36.9 C)   TempSrc:   Oral   Resp: 18 10  13   Height:      Weight: 184 lb 12.8 oz (83.825 kg)     SpO2:  97%  99%    Intake/Output Summary (Last 24 hours) at 06/22/15 0953 Last data filed at 06/22/15 0915  Gross per 24 hour  Intake 3144.67 ml  Output      0 ml  Net 3144.67 ml    PULM  BLL rales CV  RRR GI  soft, no TTP today , -G/R, staples in place VASC  Warm feet  Laboratory CBC    Component Value Date/Time   WBC 12.3* 06/22/2015 0350   HGB 8.6* 06/22/2015 0350   HCT 25.5* 06/22/2015 0350   PLT 242 06/22/2015 0350    BMET    Component Value Date/Time   NA 125* 06/22/2015 0350   K 4.0 06/22/2015 0350   CL 91* 06/22/2015 0350   CO2 19* 06/22/2015 0350   GLUCOSE 125* 06/22/2015 0350   BUN 108* 06/22/2015 0350   CREATININE 7.23* 06/22/2015 0350   CREATININE 1.10 11/07/2014 0804   CALCIUM 8.4* 06/22/2015 0350   GFRNONAA 7* 06/22/2015 0350   GFRNONAA 53* 05/14/2013 0905   GFRAA 8* 06/22/2015 0350   GFRAA 62 05/14/2013 0905   Lipase 130 Amylase 113   Adele Barthel, MD [facility] [place]: [phone] Pager: [phone]  06/22/2015, 9:53 AM

## 2015-06-23 LAB — GLUCOSE, CAPILLARY
Glucose-Capillary: 114 mg/dL — ABNORMAL HIGH (ref 65–99)
Glucose-Capillary: 117 mg/dL — ABNORMAL HIGH (ref 65–99)
Glucose-Capillary: 120 mg/dL — ABNORMAL HIGH (ref 65–99)
Glucose-Capillary: 123 mg/dL — ABNORMAL HIGH (ref 65–99)
Glucose-Capillary: 128 mg/dL — ABNORMAL HIGH (ref 65–99)

## 2015-06-23 LAB — COMPREHENSIVE METABOLIC PANEL
ALT: 27 U/L (ref 17–63)
AST: 16 U/L (ref 15–41)
Albumin: 1.8 g/dL — ABNORMAL LOW (ref 3.5–5.0)
Alkaline Phosphatase: 115 U/L (ref 38–126)
Anion gap: 18 — ABNORMAL HIGH (ref 5–15)
BUN: 151 mg/dL — ABNORMAL HIGH (ref 6–20)
CO2: 16 mmol/L — ABNORMAL LOW (ref 22–32)
Calcium: 8.6 mg/dL — ABNORMAL LOW (ref 8.9–10.3)
Chloride: 88 mmol/L — ABNORMAL LOW (ref 101–111)
Creatinine, Ser: 9.52 mg/dL — ABNORMAL HIGH (ref 0.61–1.24)
GFR calc Af Amer: 6 mL/min — ABNORMAL LOW (ref >60)
GFR calc non Af Amer: 5 mL/min — ABNORMAL LOW (ref >60)
Glucose, Bld: 123 mg/dL — ABNORMAL HIGH (ref 65–99)
Potassium: 4.5 mmol/L (ref 3.5–5.1)
Sodium: 122 mmol/L — ABNORMAL LOW (ref 135–145)
Total Bilirubin: 0.8 mg/dL (ref 0.3–1.2)
Total Protein: 5.9 g/dL — ABNORMAL LOW (ref 6.5–8.1)

## 2015-06-23 LAB — PHOSPHORUS: Phosphorus: 8.1 mg/dL — ABNORMAL HIGH (ref 2.5–4.6)

## 2015-06-23 LAB — DIFFERENTIAL
Basophils Absolute: 0 10*3/uL (ref 0.0–0.1)
Basophils Relative: 0 %
Eosinophils Absolute: 0.1 10*3/uL (ref 0.0–0.7)
Eosinophils Relative: 1 %
Lymphocytes Relative: 8 %
Lymphs Abs: 0.9 10*3/uL (ref 0.7–4.0)
Monocytes Absolute: 1.2 10*3/uL — ABNORMAL HIGH (ref 0.1–1.0)
Monocytes Relative: 10 %
Neutro Abs: 9.5 10*3/uL — ABNORMAL HIGH (ref 1.7–7.7)
Neutrophils Relative %: 81 %

## 2015-06-23 LAB — MAGNESIUM: Magnesium: 2.2 mg/dL (ref 1.7–2.4)

## 2015-06-23 LAB — CBC
HCT: 23.5 % — ABNORMAL LOW (ref 39.0–52.0)
Hemoglobin: 8 g/dL — ABNORMAL LOW (ref 13.0–17.0)
MCH: 27.6 pg (ref 26.0–34.0)
MCHC: 34 g/dL (ref 30.0–36.0)
MCV: 81 fL (ref 78.0–100.0)
Platelets: 239 10*3/uL (ref 150–400)
RBC: 2.9 MIL/uL — ABNORMAL LOW (ref 4.22–5.81)
RDW: 15.7 % — ABNORMAL HIGH (ref 11.5–15.5)
WBC: 11.8 10*3/uL — ABNORMAL HIGH (ref 4.0–10.5)

## 2015-06-23 LAB — PREALBUMIN: Prealbumin: 15.6 mg/dL — ABNORMAL LOW (ref 18–38)

## 2015-06-23 LAB — TRIGLYCERIDES: Triglycerides: 97 mg/dL (ref <150)

## 2015-06-23 LAB — AMYLASE: Amylase: 157 U/L — ABNORMAL HIGH (ref 28–100)

## 2015-06-23 LAB — LIPASE, BLOOD: Lipase: 152 U/L — ABNORMAL HIGH (ref 11–51)

## 2015-06-23 MED ORDER — TRACE MINERALS CR-CU-MN-SE-ZN 10-1000-500-60 MCG/ML IV SOLN
INTRAVENOUS | Status: AC
  Administered 2015-06-23: 19:00:00 via INTRAVENOUS
  Filled 2015-06-23: qty 1992

## 2015-06-23 MED ORDER — DARBEPOETIN ALFA 150 MCG/0.3ML IJ SOSY
150.0000 ug | PREFILLED_SYRINGE | INTRAMUSCULAR | Status: DC
  Administered 2015-06-23: 150 ug via SUBCUTANEOUS
  Filled 2015-06-23 (×2): qty 0.3

## 2015-06-23 MED ORDER — LIDOCAINE HCL (PF) 1 % IJ SOLN
5.0000 mL | INTRAMUSCULAR | Status: DC | PRN
Start: 2015-06-23 — End: 2015-06-23

## 2015-06-23 MED ORDER — TRACE MINERALS CR-CU-MN-SE-ZN 10-1000-500-60 MCG/ML IV SOLN
INTRAVENOUS | Status: DC
  Filled 2015-06-23: qty 1992

## 2015-06-23 MED ORDER — PENTAFLUOROPROP-TETRAFLUOROETH EX AERO: 1.0000 "application " | INHALATION_SPRAY | CUTANEOUS | Status: DC | PRN

## 2015-06-23 MED ORDER — SODIUM CHLORIDE 0.9 % IV SOLN: 100.0000 mL | INTRAVENOUS | Status: DC | PRN

## 2015-06-23 MED ORDER — HEPARIN SODIUM (PORCINE) 1000 UNIT/ML DIALYSIS: 20.0000 [IU]/kg | INTRAMUSCULAR | Status: DC | PRN

## 2015-06-23 MED ORDER — FAT EMULSION 20 % IV EMUL
240.0000 mL | INTRAVENOUS | Status: DC
  Filled 2015-06-23: qty 250

## 2015-06-23 MED ORDER — DARBEPOETIN ALFA 150 MCG/0.3ML IJ SOSY
PREFILLED_SYRINGE | INTRAMUSCULAR | Status: AC
  Administered 2015-06-23: 150 ug via SUBCUTANEOUS
  Filled 2015-06-23: qty 0.3

## 2015-06-23 MED ORDER — HEPARIN SODIUM (PORCINE) 1000 UNIT/ML DIALYSIS: 1000.0000 [IU] | INTRAMUSCULAR | Status: DC | PRN

## 2015-06-23 MED ORDER — FAT EMULSION 20 % IV EMUL
240.0000 mL | INTRAVENOUS | Status: AC
  Administered 2015-06-23: 240 mL via INTRAVENOUS
  Filled 2015-06-23: qty 250

## 2015-06-23 MED ORDER — LIDOCAINE-PRILOCAINE 2.5-2.5 % EX CREA: 1.0000 "application " | TOPICAL_CREAM | CUTANEOUS | Status: DC | PRN

## 2015-06-23 MED ORDER — ALTEPLASE 2 MG IJ SOLR: 2.0000 mg | Freq: Once | INTRAMUSCULAR | Status: DC | PRN

## 2015-06-23 NOTE — Progress Notes (Signed)
PT Cancellation Note  Patient Details Name: [name] [name] MRN: [id number] DOB: 03-01-1951   Cancelled Treatment:    Reason Eval/Treat Not Completed: Patient at procedure or test/unavailable Pt off floor at HD. Will follow up.   Marguarite Arbour A Dekayla Prestridge 06/23/2015, 1:15 PM  Wray Kearns, [place], DPT [phone]

## 2015-06-23 NOTE — Progress Notes (Signed)
Subjective:  Hemodynamically stable- no UOP - says feels urge to pee Objective Vital signs in last 24 hours: Filed Vitals:   06/23/15 0400 06/23/15 0500 06/23/15 0600 06/23/15 0700  BP: 119/47 133/63 131/64 123/66  Pulse: 74  78 77  Temp: 98.6 F (37 C)   98.2 F (36.8 C)  TempSrc: Oral   Oral  Resp: 13 10 10 12   Height:      Weight:   88.8 kg (195 lb 12.3 oz)   SpO2: 95%  93% 99%   Weight change: 4.975 kg (10 lb 15.5 oz)  Intake/Output Summary (Last 24 hours) at 06/23/15 0744 Last data filed at 06/23/15 0700  Gross per 24 hour  Intake   3010 ml  Output      0 ml  Net   3010 ml    Assessment/ Plan: Pt is a 65 y.o. yo male who was admitted on 06/09/2015 with need for AAA repair with complication of AKI Assessment/Plan: 1. AKI- in the setting of a chronically nonfunctional right kdney and hemodynamic changes associated with AAA repair.  required CRRT 2/9- 2/13- then IHD 2/15 and 2/17- observation over weekend no UOP and numbers worsening daily- plan is for IHD today via vascath placed 2/7- will need tunneled HD cath this week.  Creatinine was 1.27 on 06/05/15 but has known chronically occluded and likely nonfunctional kidney on the right so not sure chances for recovery.  Says feels urge to urinate- will check bladder scan and place foley if feel is needed 2. HTN/vol- overloaded with hyponatremia- attempting 3 liters off with HD today as BP will tolerate 3. Anemia- due to surgery and CKD- - start aranesp and transfuse as needed 4. S./p AAA repair- per VVS    Julieth Tugman A    Labs: Basic Metabolic Panel:  Recent Labs Lab 06/21/15 0440 06/22/15 0350 06/23/15 0250  NA 129* 125* 122*  K 3.9 4.0 4.5  CL 92* 91* 88*  CO2 24 19* 16*  GLUCOSE 135* 125* 123*  BUN 63* 108* 151*  CREATININE 4.76* 7.23* 9.52*  CALCIUM 8.1* 8.4* 8.6*  PHOS 4.6 7.6* 8.1*   Liver Function Tests:  Recent Labs Lab 06/19/15 0341 06/20/15 0335 06/21/15 0440 06/23/15 0250  AST 23  --    --  16  ALT 37  --   --  27  ALKPHOS 108  --   --  115  BILITOT 0.5  --   --  0.8  PROT 5.5*  --   --  5.9*  ALBUMIN 1.9* 1.8* 1.7* 1.8*    Recent Labs Lab 06/20/15 1035 06/22/15 0350 06/23/15 0250  LIPASE 258* 130* 152*  AMYLASE 156* 113* 157*   No results for input(s): AMMONIA in the last 168 hours. CBC:  Recent Labs Lab 06/19/15 0803 06/20/15 0336 06/21/15 0440 06/22/15 0350 06/23/15 0250  WBC 12.9* 15.0* 13.4* 12.3* 11.8*  NEUTROABS  --   --   --   --  9.5*  HGB 8.0* 8.0* 8.0* 8.6* 8.0*  HCT 23.9* 23.1* 23.7* 25.5* 23.5*  MCV 82.7 82.2 81.4 82.0 81.0  PLT 152 181 196 242 239   Cardiac Enzymes: No results for input(s): CKTOTAL, CKMB, CKMBINDEX, TROPONINI in the last 168 hours. CBG:  Recent Labs Lab 06/22/15 1128 06/22/15 1520 06/22/15 1941 06/23/15 0023 06/23/15 0326  GLUCAP 115* 111* 116* 114* 128*    Iron Studies: No results for input(s): IRON, TIBC, TRANSFERRIN, FERRITIN in the last 72 hours. Studies/Results: No results found. Medications:  Infusions: . sodium chloride 20 mL/hr at 06/22/15 0700  . feeding supplement (VITAL AF 1.2 CAL)    . phenylephrine (NEO-SYNEPHRINE) Adult infusion Stopped (06/14/15 1100)  . TPN (CLINIMIX) Adult without lytes 115 mL/hr at 06/22/15 1728    Scheduled Medications: . alteplase  2 mg Intracatheter Once  . antiseptic oral rinse  7 mL Mouth Rinse q12n4p  . chlorhexidine  15 mL Mouth Rinse BID  . enoxaparin (LOVENOX) injection  30 mg Subcutaneous Q24H  . insulin aspart  0-15 Units Subcutaneous 6 times per day  . metoprolol  5 mg Intravenous 4 times per day  . morphine   Intravenous 6 times per day  . pantoprazole (PROTONIX) IV  40 mg Intravenous Q24H  . sodium chloride flush  10-40 mL Intracatheter Q12H    have reviewed scheduled and prn medications.  Physical Exam: General: alert Heart: RRR Lungs: CBS vilat Abdomen: soft, non tender Extremities: minimal edema Dialysis Access: vascath placed 2/7     06/23/2015,7:44 AM  LOS: 14 days

## 2015-06-23 NOTE — Progress Notes (Signed)
Paged Dr. Moshe Cipro concerning patient's SBP 77, received orders to decrease goal to 2500.  Will continue to monitor patient.

## 2015-06-23 NOTE — Progress Notes (Signed)
PARENTERAL NUTRITION CONSULT NOTE - FOLLOW UP  Pharmacy Consult:  TPN Indication:  Prolonged ileus  No Known Allergies  Patient Measurements:  Height: 6' (182.9 cm) Weight: 195 lb 12.3 oz (88.8 kg) IBW/kg (Calculated) : 77.6  Vital Signs: Temp: 98.2 F (36.8 C) (02/20 0700) Temp Source: Oral (02/20 0700) BP: 123/66 mmHg (02/20 0700) Pulse Rate: 77 (02/20 0700) Intake/Output from previous day: 02/19 0701 - 02/20 0700 In: 3010 [I.V.:460; NG/GT:20; TPN:2530] Out: -   Labs:  Recent Labs  06/21/15 0440 06/22/15 0350 06/23/15 0250  WBC 13.4* 12.3* 11.8*  HGB 8.0* 8.6* 8.0*  HCT 23.7* 25.5* 23.5*  PLT 196 242 239     Recent Labs  06/21/15 0440 06/22/15 0350 06/23/15 0250  NA 129* 125* 122*  K 3.9 4.0 4.5  CL 92* 91* 88*  CO2 24 19* 16*  GLUCOSE 135* 125* 123*  BUN 63* 108* 151*  CREATININE 4.76* 7.23* 9.52*  CALCIUM 8.1* 8.4* 8.6*  MG 1.8 2.4 2.2  PHOS 4.6 7.6* 8.1*  PROT  --   --  5.9*  ALBUMIN 1.7*  --  1.8*  AST  --   --  16  ALT  --   --  27  ALKPHOS  --   --  115  BILITOT  --   --  0.8  PREALBUMIN  --   --  15.6*  TRIG  --   --  97   Estimated Creatinine Clearance: 8.8 mL/min (by C-G formula based on Cr of 9.52).    Recent Labs  06/22/15 1941 06/23/15 0023 06/23/15 0326  GLUCAP 116* 114* 128*     Insulin Requirements in the past 24 hours:  6 units SSI   Assessment: 64 YOM with history of occluded right renal artery discovered on 05/28/15 presented on 06/09/15 for AAA repair, left renal artery bypass and aortic endarterectomy.  Post-op course was complicated by respiratory failure requiring intubation and AKI requiring dialysis.  Pharmacy consulted to initiate TPN for prolonged ileus on 06/13/15.  GI: GERD - prealbumin 11.2 >> 15.6.  Post pyloric feeding tube placed on 2/17, TF held d/t retching and nausea from pancreatitis.  +flatus, PPI IV Endo: TSH WNL.  No hx of DM - CBGs well controlled with minimal SSI use Lytes: no lytes in TPN - low  Na/CL 122/88 (MD documented Clinimix is the cause of hyponatremia), CO2 low, Phos 8.1 (Ca x Phos = 84, goal < 55) Renal: AKI s/p HD x1 on 2/8, then CRRT 2/9 >> 2/14, iHD resumed on 2/15 (BL SCr 1.2), BUN increased to 151 - no UOP, NS at 20 ml/hr.  Tunneled dialysis cath this week. Pulm: hx tobacco use, intubated 2/10, stable on RA Cards: ASCVD / HTN / Afib / PVD / HLD - VSS - Lopressor IV, off amiodarone gtt Hepatobil: amylase peaked at 225, now 157, lipase 152, LFTs normalized, tbili WNL.  TG 449 >> 97 AC/Heme: Coumadin for Afib stopped 2/1 PTA - HIT Ab negative - hgb low and relatively stable, and plts recovered - Aranesp qMon Neuro: hx CVA.  Frequent usage of PRN Fentanyl - morphine PCA, pain score 1 ID: s/p tx for HCAP - afebrile, WBC improving, PCT 5.29  Zosyn 2/10 >> 2/13 Vanc 2/10 >> 2/13 CTX 2/13 >> 2/16  2/10 endotracheal cx - Serratia (sensitive to CTX, Septra, Cipro)  Best Practices: Lovenox, MC TPN Access: CVC triple lumen placed 06/13/15 TPN start date: 2/10 >>  Current Nutrition:  NPO Vital HP (not tolerating)  Clinimix + ILE 20% on MWF   Nutritional Goals: 2100-2300 kCal and 135-145 g, protein per day    Plan:  - Reduce Clinimix 5/15 (no electrolytes) to 83 ml/hr + change ILE 20% to daily at 10 ml/hr.  TPN will provide 1894 kCal and 100gm protein daily, meeting 90% of minimal kCal and 74% of minimal protein needs. Although Clinimix at 115 ml/hr + ILE 20% on MWF were providing ~1.5 g/kg of protein per day and was appropriate for patient on dialysis, will reduce provision given elevated BUN, now to provide ~1.1 g/kg/day of protein. - Add 154 mEq Na+ to improve hyponatremia (use NaCL).  F/U Na+ correction to increase NaCL to make Clinimix isotonic (= 307 mEq/2L bag). - Daily multivitamin and trace elements - Continue moderate SSI - F/U change Lovenox to heparin SQ given ESRD - F/U AM labs   Levy Cedano D. Mina Marble, PharmD, BCPS Pager:  [phone] 06/23/2015, 10:34 AM

## 2015-06-23 NOTE — Progress Notes (Signed)
OT Cancellation Note  Patient Details Name: [name] [name] MRN: [id number] DOB: Sep 19, 1950   Cancelled Treatment:    Reason Eval/Treat Not Completed: Patient at procedure or test/ unavailable ( HD)  Parke Poisson B 06/23/2015, 1:47 PM   Jeri Modena   OTR/L Pager: [phone] Office: [phone] .

## 2015-06-23 NOTE — Procedures (Signed)
Patient was seen on dialysis and the procedure was supervised.  BFR 300  Via cath BP is  133/67.   Patient appears to be tolerating treatment well  [name] [name] A 06/23/2015

## 2015-06-23 NOTE — Progress Notes (Signed)
[facility] Progress Note  Subjective  - POD #14  Denies abdominal pain and nausea. Denies SOB or CP. Passing flatus.   Objective Filed Vitals:   06/23/15 0600 06/23/15 0700  BP: 131/64 123/66  Pulse: 78 77  Temp:  98.2 F (36.8 C)  Resp: 10 12   Tmax 98.9 BP 110s-140s 02 99% RA  Intake/Output Summary (Last 24 hours) at 06/23/15 0820 Last data filed at 06/23/15 0700  Gross per 24 hour  Intake   2855 ml  Output      0 ml  Net   2855 ml   Alert sitting in chair in NAD Abdomen mildly distended but soft and non-tender. Normoactive bowel sounds.  Midline incision healing well.  Feet warm and well perfused.    Assessment/Planning: 65 y.o. male is s/p: repair of juxtarenal AAA, left renal bypass, pancreatitis, AKI 14 Days Post-Op   Pancreatitis: Minor bump in amylase and lipase. Continue TPN. Will try clear liquids slowly. If he doesn't tolerate, will d/c.  Hgb stable at 8. Received 2 units pRBCs 06/21/15.  AKI: Remains anuric. Will plan for HD cath placement sometime this week.  Afib: Rate controlled. Will resume anticoagulation after HD cath is placed.  Mobilize.  Transfer to [place] 06/23/2015 8:20 AM --  Laboratory CBC    Component Value Date/Time   WBC 11.8* 06/23/2015 0250   HGB 8.0* 06/23/2015 0250   HCT 23.5* 06/23/2015 0250   PLT 239 06/23/2015 0250    BMET    Component Value Date/Time   NA 122* 06/23/2015 0250   K 4.5 06/23/2015 0250   CL 88* 06/23/2015 0250   CO2 16* 06/23/2015 0250   GLUCOSE 123* 06/23/2015 0250   BUN 151* 06/23/2015 0250   CREATININE 9.52* 06/23/2015 0250   CREATININE 1.10 11/07/2014 0804   CALCIUM 8.6* 06/23/2015 0250   GFRNONAA 5* 06/23/2015 0250   GFRNONAA 53* 05/14/2013 0905   GFRAA 6* 06/23/2015 0250   GFRAA 62 05/14/2013 0905    COAG Lab Results  Component Value Date   INR 1.39 06/10/2015   INR 1.40 06/09/2015   INR 1.07 06/09/2015   No results found for:  PTT  Antibiotics Anti-infectives    Start     Dose/Rate Route Frequency Ordered Stop   06/16/15 1300  cefTRIAXone (ROCEPHIN) 1 g in dextrose 5 % 50 mL IVPB     1 g 100 mL/hr over 30 Minutes Intravenous Every 24 hours 06/16/15 1154 06/19/15 1358   06/14/15 1200  vancomycin (VANCOCIN) IVPB 1000 mg/200 mL premix  Status:  Discontinued     1,000 mg 200 mL/hr over 60 Minutes Intravenous Every 24 hours 06/13/15 1014 06/15/15 1101   06/13/15 1200  piperacillin-tazobactam (ZOSYN) IVPB 3.375 g  Status:  Discontinued     3.375 g 100 mL/hr over 30 Minutes Intravenous 4 times per day 06/13/15 1014 06/16/15 1154   06/13/15 1015  vancomycin (VANCOCIN) 1,500 mg in sodium chloride 0.9 % 250 mL IVPB     1,500 mg 250 mL/hr over 60 Minutes Intravenous  Once 06/13/15 1014 06/13/15 1143   06/09/15 2000  cefUROXime (ZINACEF) 1.5 g in dextrose 5 % 50 mL IVPB     1.5 g 100 mL/hr over 30 Minutes Intravenous Every 12 hours 06/09/15 1522 06/10/15 0855   06/09/15 1130  cefUROXime (ZINACEF) 1.5 g in dextrose 5 % 50 mL IVPB  Status:  Discontinued     1.5 g 100 mL/hr over 30  Minutes Intravenous To Surgery 06/09/15 1123 06/09/15 1509   06/09/15 0700  cefUROXime (ZINACEF) 1.5 g in dextrose 5 % 50 mL IVPB     1.5 g 100 mL/hr over 30 Minutes Intravenous To ShortStay Surgical 06/08/15 1434 06/09/15 1200       Virgina Jock, PA-C [facility] Office: [phone] Pager: [phone] 06/23/2015 8:20 AM  Addendum  I have independently interviewed and examined the patient, and I agree with the physician assistant's findings.  Pancreatitis improving.  Ok to tsfr to floor.  Will need TDC placement at some point.  Adele Barthel, MD [facility] [place] Office: [phone] Pager: [phone]  06/23/2015, 9:09 AM

## 2015-06-23 NOTE — Progress Notes (Signed)
UR Completed. Hanifah Royse, RN, BSN.  [phone] 

## 2015-06-23 NOTE — Progress Notes (Signed)
I continue to follow pt's progress. Will follow up after next therapy treatment. [id number]

## 2015-06-23 NOTE — Care Management Important Message (Signed)
Important Message  Patient Details  Name: [name] [name] MRN: [id number] Date of Birth: 12-11-1950   Medicare Important Message Given:  Yes    Loann Quill 06/23/2015, 11:03 AM

## 2015-06-23 NOTE — Care Management Note (Signed)
Case Management Note  Patient Details  Name: [name] [name] MRN: [id number] Date of Birth: 1950/05/31  Subjective/Objective:    He underwent an elective surgical repair including an aorto to left renal bypass procedure on 06/09/2015 and developed VDRF -  Transferred to ICU                Action/Plan:  06/23/2015  CM spoke with pt directly while in HD and he is in agreement to discharge plan detailed below.  06/20/15 Pt extubated, on intermittent HD, IV amio, TPN due to prolonged post op ileus.  Plan is for pt to have tunneled HD catheter placed early next week.  Pt deemed appropriate for LTACH per attending and physician advisor.  Pt in HD, CM spoke with wife , agreement was obtained for [facility] placement insurance authorization for possible discharge early next week, wife offered choice of [facility] and [facility], chose [facility] due to location.  Wife advised CM against talking with pt during HD; wife wants to discuss it with pt this weekend.  CM will follow up on Monday and make adjustments to discharge plans accordingly.  [facility] actively seeking insurance auth   Pt re intubated 06/13/15  Pt is from home with wife and daughter serves as support person.  CM will continue to monitor for disposition needs   Expected Discharge Date:                  Expected Discharge Plan:  [place]  In-House Referral:     Discharge planning Services  CM Consult  Post Acute Care Choice:    Choice offered to:     DME Arranged:    DME Agency:     HH Arranged:    HH Agency:     Status of Service:  In process, will continue to follow  Medicare Important Message Given:  Yes Date Medicare IM Given:    Medicare IM give by:    Date Additional Medicare IM Given:    Additional Medicare Important Message give by:     If discussed at [place] of Stay Meetings, dates discussed:    Additional Comments:  Maryclare Labrador, RN 06/23/2015, 2:43 PM

## 2015-06-24 LAB — BASIC METABOLIC PANEL
Anion gap: 14 (ref 5–15)
BUN: 80 mg/dL — ABNORMAL HIGH (ref 6–20)
CO2: 24 mmol/L (ref 22–32)
Calcium: 8.2 mg/dL — ABNORMAL LOW (ref 8.9–10.3)
Chloride: 96 mmol/L — ABNORMAL LOW (ref 101–111)
Creatinine, Ser: 6.22 mg/dL — ABNORMAL HIGH (ref 0.61–1.24)
GFR calc Af Amer: 10 mL/min — ABNORMAL LOW (ref >60)
GFR calc non Af Amer: 9 mL/min — ABNORMAL LOW (ref >60)
Glucose, Bld: 121 mg/dL — ABNORMAL HIGH (ref 65–99)
Potassium: 3.4 mmol/L — ABNORMAL LOW (ref 3.5–5.1)
Sodium: 134 mmol/L — ABNORMAL LOW (ref 135–145)

## 2015-06-24 LAB — GLUCOSE, CAPILLARY
Glucose-Capillary: 110 mg/dL — ABNORMAL HIGH (ref 65–99)
Glucose-Capillary: 117 mg/dL — ABNORMAL HIGH (ref 65–99)
Glucose-Capillary: 118 mg/dL — ABNORMAL HIGH (ref 65–99)
Glucose-Capillary: 120 mg/dL — ABNORMAL HIGH (ref 65–99)
Glucose-Capillary: 131 mg/dL — ABNORMAL HIGH (ref 65–99)
Glucose-Capillary: 143 mg/dL — ABNORMAL HIGH (ref 65–99)

## 2015-06-24 LAB — LIPASE, BLOOD: Lipase: 122 U/L — ABNORMAL HIGH (ref 11–51)

## 2015-06-24 LAB — CBC
HCT: 21.5 % — ABNORMAL LOW (ref 39.0–52.0)
Hemoglobin: 7.2 g/dL — ABNORMAL LOW (ref 13.0–17.0)
MCH: 27.5 pg (ref 26.0–34.0)
MCHC: 33.5 g/dL (ref 30.0–36.0)
MCV: 82.1 fL (ref 78.0–100.0)
Platelets: 250 10*3/uL (ref 150–400)
RBC: 2.62 MIL/uL — ABNORMAL LOW (ref 4.22–5.81)
RDW: 16.1 % — ABNORMAL HIGH (ref 11.5–15.5)
WBC: 9.2 10*3/uL (ref 4.0–10.5)

## 2015-06-24 LAB — AMYLASE: Amylase: 145 U/L — ABNORMAL HIGH (ref 28–100)

## 2015-06-24 LAB — PHOSPHORUS: Phosphorus: 4.4 mg/dL (ref 2.5–4.6)

## 2015-06-24 LAB — MAGNESIUM: Magnesium: 1.9 mg/dL (ref 1.7–2.4)

## 2015-06-24 LAB — ALBUMIN: Albumin: 1.7 g/dL — ABNORMAL LOW (ref 3.5–5.0)

## 2015-06-24 LAB — PREPARE RBC (CROSSMATCH)

## 2015-06-24 MED ORDER — POTASSIUM CHLORIDE 10 MEQ/50ML IV SOLN
10.0000 meq | INTRAVENOUS | Status: AC
  Administered 2015-06-24 (×4): 10 meq via INTRAVENOUS
  Filled 2015-06-24 (×4): qty 50

## 2015-06-24 MED ORDER — FAT EMULSION 20 % IV EMUL
240.0000 mL | INTRAVENOUS | Status: DC
  Administered 2015-06-24: 240 mL via INTRAVENOUS
  Filled 2015-06-24: qty 250

## 2015-06-24 MED ORDER — SODIUM CHLORIDE 0.9 % IV SOLN
Freq: Once | INTRAVENOUS | Status: AC
  Administered 2015-06-24: 17:00:00 via INTRAVENOUS

## 2015-06-24 MED ORDER — TRACE MINERALS CR-CU-MN-SE-ZN 10-1000-500-60 MCG/ML IV SOLN
INTRAVENOUS | Status: DC
  Administered 2015-06-24: 18:00:00 via INTRAVENOUS
  Filled 2015-06-24: qty 1992

## 2015-06-24 MED ORDER — MAGNESIUM SULFATE IN D5W 10-5 MG/ML-% IV SOLN
1.0000 g | Freq: Once | INTRAVENOUS | Status: AC
  Administered 2015-06-24: 1 g via INTRAVENOUS
  Filled 2015-06-24: qty 100

## 2015-06-24 NOTE — Progress Notes (Signed)
Pt has progressed well with therapy and remains with ARI requiring hemodialysis and on TNA. Noted likely to need tunneled HD access to begin set up for outpt dialysis soon.Pt not medically ready for d/c to a rehab venue at this time. I will continue to follow.Pt may progress functionally to not need an inpt or SNF rehab when medically ready for d/c. I have discussed with RN CM. [phone]

## 2015-06-24 NOTE — Progress Notes (Addendum)
[facility] Progress Note  Subjective  - POD #15  Denies abdominal pain and nausea. Denies shortness of breath and chest discomfort.   Objective Filed Vitals:   06/24/15 0400 06/24/15 0401  BP:  146/79  Pulse:  91  Temp:  98.2 F (36.8 C)  Resp: 20 20   Tmax 100.6 BP sys 70s-140s 02 99% RA UOP 475  Intake/Output Summary (Last 24 hours) at 06/24/15 0748 Last data filed at 06/23/15 1650  Gross per 24 hour  Intake    675 ml  Output   2894 ml  Net  -2219 ml   Heart irregularly irregular Lungs CTAB Abdomen normoactive BS, non tender, midline incision clean and intact.  Feet warm and well perfused.   Assessment/Planning: 65 y.o. male is s/p: repair of juxtarenal AAA, left renal bypass, pancreatitis, AKI 15 Days Post-Op   Pancreatitis improving. Tolerating clear liquids. Will continue clears today. Continue TPN.  AKI: Creatinine trending down. UOP 475 mL via I &O cath during day shift yesterday.  Afib: Continue to hold anticoagulation in case HD cath is needed.  Anemia: Hgb 7.2. No signs or symptoms of bleeding. Will ask Dr. Bridgett Larsson about transfusion.  Continue to mobilize.    Alvia Grove 06/24/2015 7:48 AM --  Laboratory CBC    Component Value Date/Time   WBC 9.2 06/24/2015 0340   HGB 7.2* 06/24/2015 0340   HCT 21.5* 06/24/2015 0340   PLT 250 06/24/2015 0340    BMET    Component Value Date/Time   NA 134* 06/24/2015 0340   K 3.4* 06/24/2015 0340   CL 96* 06/24/2015 0340   CO2 24 06/24/2015 0340   GLUCOSE 121* 06/24/2015 0340   BUN 80* 06/24/2015 0340   CREATININE 6.22* 06/24/2015 0340   CREATININE 1.10 11/07/2014 0804   CALCIUM 8.2* 06/24/2015 0340   GFRNONAA 9* 06/24/2015 0340   GFRNONAA 53* 05/14/2013 0905   GFRAA 10* 06/24/2015 0340   GFRAA 62 05/14/2013 0905    COAG Lab Results  Component Value Date   INR 1.39 06/10/2015   INR 1.40 06/09/2015   INR 1.07 06/09/2015   No results found for:  PTT  Antibiotics Anti-infectives    Start     Dose/Rate Route Frequency Ordered Stop   06/16/15 1300  cefTRIAXone (ROCEPHIN) 1 g in dextrose 5 % 50 mL IVPB     1 g 100 mL/hr over 30 Minutes Intravenous Every 24 hours 06/16/15 1154 06/19/15 1358   06/14/15 1200  vancomycin (VANCOCIN) IVPB 1000 mg/200 mL premix  Status:  Discontinued     1,000 mg 200 mL/hr over 60 Minutes Intravenous Every 24 hours 06/13/15 1014 06/15/15 1101   06/13/15 1200  piperacillin-tazobactam (ZOSYN) IVPB 3.375 g  Status:  Discontinued     3.375 g 100 mL/hr over 30 Minutes Intravenous 4 times per day 06/13/15 1014 06/16/15 1154   06/13/15 1015  vancomycin (VANCOCIN) 1,500 mg in sodium chloride 0.9 % 250 mL IVPB     1,500 mg 250 mL/hr over 60 Minutes Intravenous  Once 06/13/15 1014 06/13/15 1143   06/09/15 2000  cefUROXime (ZINACEF) 1.5 g in dextrose 5 % 50 mL IVPB     1.5 g 100 mL/hr over 30 Minutes Intravenous Every 12 hours 06/09/15 1522 06/10/15 0855   06/09/15 1130  cefUROXime (ZINACEF) 1.5 g in dextrose 5 % 50 mL IVPB  Status:  Discontinued     1.5 g 100 mL/hr over 30 Minutes Intravenous To Surgery 06/09/15 1123 06/09/15  1509   06/09/15 0700  cefUROXime (ZINACEF) 1.5 g in dextrose 5 % 50 mL IVPB     1.5 g 100 mL/hr over 30 Minutes Intravenous To ShortStay Surgical 06/08/15 1434 06/09/15 [place], PA-C [facility] Office: [phone] Pager: [phone] 06/24/2015 7:48 AM  Addendum  I have independently interviewed and examined the patient, and I agree with the physician assistant's findings.  +flatus/-BM.  Abd pain resolved.  Ok to advance diet though pt not taking much PO.   H/H decreased likely related to hemodilution, ARF, and intraoperative EBL.  I doubt continued bleeding.  Will transfuse 2 u pRBC given CAD history.  Still on schedule for [facility] placement on Thursday with Dr. Scot Dock.   Adele Barthel, MD [facility] [place] Office:  [phone] Pager: [phone]  06/24/2015, 10:17 AM

## 2015-06-24 NOTE — Care Management Note (Signed)
Case Management Note Previous CM note initiated by Elenor Quinones RN, CM  Patient Details  Name: [name] [name] MRN: [id number] Date of Birth: 08-Sep-1950  Subjective/Objective:    He underwent an elective surgical repair including an aorto to left renal bypass procedure on 06/09/2015 and developed VDRF -  Transferred to ICU                Action/Plan:  06/24/2015  CM spoke with pt directly while in HD and he is in agreement to discharge plan detailed below.  06/20/15 Pt extubated, on intermittent HD, IV amio, TPN due to prolonged post op ileus.  Plan is for pt to have tunneled HD catheter placed early next week.  Pt deemed appropriate for LTACH per attending and physician advisor.  Pt in HD, CM spoke with wife , agreement was obtained for [facility] placement insurance authorization for possible discharge early next week, wife offered choice of [facility] and [facility], chose [facility] due to location.  Wife advised CM against talking with pt during HD; wife wants to discuss it with pt this weekend.  CM will follow up on Monday and make adjustments to discharge plans accordingly.  [facility] actively seeking insurance auth   Pt re intubated 06/13/15  Pt is from home with wife and daughter serves as support person.  CM will continue to monitor for disposition needs   Expected Discharge Date:                  Expected Discharge Plan:  [place]  In-House Referral:     Discharge planning Services  CM Consult  Post Acute Care Choice:  Home Health, Durable Medical Equipment Choice offered to:     DME Arranged:    DME Agency:     HH Arranged:    HH Agency:     Status of Service:  In process, will continue to follow  Medicare Important Message Given:  Yes Date Medicare IM Given:    Medicare IM give by:    Date Additional Medicare IM Given:    Additional Medicare Important Message give by:     If discussed at [place] of Stay Meetings, dates discussed:    Additional  Comments:  06/24/15- 1000- Marvetta Gibbons RN, BSN-  Pt has been denied LTACH by insurance [employer]- per Aaron Edelman with [employer]- attending MD could do a peer to peer if felt like LTACH is pt's best option however- pt progressing well with therapies- CIR also following (see note), pt remains on TNA for now - diet has been advanced- per notes do not see any documentation for any wounds. Renal following and note for possible tunneled cath at the end of the week for ?continued HD. CM to continue to follow for d/c needs- pt may progress to being able to go home with [facility].  Dawayne Patricia, RN 06/24/2015, 3:04 PM

## 2015-06-24 NOTE — Progress Notes (Signed)
Occupational Therapy Treatment Patient Details Name: [name] [name] MRN: [id number] DOB: 06-14-1950 Today's Date: 06/24/2015    History of present illness 65 yo Male with PMH as below, which is significant HTN, Atrial Fib on warfarin, CVA, and known AAA. He was recently admitted to [facility] ED 1/20 with complaints of abdominal pain. CT abdomen at that time showed evidence of hypoperfusion to the R kidney. CT angio showed some enlargement of the seat of an aneurysm. 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 It was on that day that he presented, and underwent the procedures.    OT comments  Pt demonstrating improvement in overall strength and endurance.  Able to ambulate to sink and perform grooming and practice bathroom transfers with min guard assist.  Pt continues to demonstrate attention deficits and decreased awareness of safety needing close guard assistance for mobility. Pt is able to use his smart phone with increased time to locate family pictures he wanted to show therapist. Will continue to follow, may be able to return home if he has 24 hour care initially.    Follow Up Recommendations  SNF;Supervision/Assistance - 24 hour    Equipment Recommendations       Recommendations for Other Services      Precautions / Restrictions Precautions Precautions: Fall Precaution Comments: CVVHD transitioned to HD on 06/17/15 Restrictions Weight Bearing Restrictions: No       Mobility Bed Mobility       General bed mobility comments: pt in chair  Transfers Overall  transfer level: Needs assistance Equipment used: Rolling walker (2 wheeled) Transfers: Sit to/from Stand Sit to Stand: Min guard         General transfer comment: cues for safety and hand placement with walker use    Balance     Sitting balance-Leahy Scale: Good       Standing balance-Leahy Scale: Fair                     ADL Overall ADL's : Needs assistance/impaired     Grooming: Brushing hair;Oral care;Standing;Min guard               Lower Body Dressing: Min guard;Sit to/from stand   Toilet Transfer: Min guard;RW;BSC   Toileting- Water quality scientist and Hygiene: Min guard;Sit to/from stand         General ADL Comments: Pt stating he feels like he needs to urinate, but did not produce urine.      Vision                     Perception     Praxis      Cognition   Behavior During Therapy: Impulsive Overall Cognitive Status: Within Functional Limits for tasks assessed Area of Impairment: Attention;Safety/judgement;Memory;Problem solving   Current Attention Level: Selective Memory: Decreased short-term memory  Following Commands: Follows one step commands consistently Safety/Judgement: Decreased awareness of safety   Problem Solving: Slow processing General Comments: pt with decreased awareness of lines, repeatedly asking why his IV was beeping, pt aware of his slowness of thought, thinks it may be related to  morphine    Extremity/Trunk Assessment               Exercises     Shoulder Instructions       General Comments      Pertinent Vitals/ Pain       Pain Assessment: No/denies pain  Home Living                                          Prior Functioning/Environment              Frequency Min 2X/week     Progress Toward Goals  OT Goals(current goals can now be found in the care plan section)  Progress towards OT goals: Progressing toward goals  Acute Rehab OT Goals Patient Stated  Goal: feel better  Plan Discharge plan remains appropriate    Co-evaluation                 End of Session Equipment Utilized During Treatment: Rolling walker   Activity Tolerance Patient tolerated treatment well   Patient Left in chair;with call bell/phone within reach   Nurse Communication          Time: [id number] OT Time Calculation (min): 28 min  Charges: OT General Charges $OT Visit: 1 Procedure OT Treatments $Self Care/Home Management : 23-37 mins  [name] 06/24/2015, 11:16 AM  [phone]

## 2015-06-24 NOTE — Progress Notes (Signed)
Physical Therapy Treatment Patient Details Name: [name] [name] MRN: [id number] DOB: 11-Oct-1950 Today's Date: 06/24/2015    History of Present Illness 65 yo Male with PMH as below, which is significant HTN, Atrial Fib on warfarin, CVA, and known AAA. He was recently admitted to [facility] ED 1/20 with complaints of abdominal pain. CT abdomen at that time showed evidence of hypoperfusion to the R kidney. CT angio showed some enlargement of the seat of an aneurysm. 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 It was on that day that he presented, and underwent the procedures.     PT Comments    Pt performed transfers with decreased assist min guard with cues for hand placement.  Gait training with RW 320 ft min guard.  Cues for RW safety.  Will inform supervising PT of patients progress.    Follow Up Recommendations  SNF;Supervision/Assistance - 24 hour     Equipment Recommendations  None recommended by PT    Recommendations for Other Services       Precautions / Restrictions Precautions Precautions: Fall Precaution Comments: CVVHD transitioned to HD on 06/17/15 Restrictions Weight Bearing Restrictions: No    Mobility  Bed Mobility Overal bed mobility: Modified Independent   Rolling: Modified independent (Device/Increase time)   Supine to sit: Modified independent (Device/Increase time) Sit to supine: Modified independent (Device/Increase time)   General bed mobility comments: demonstrated good safety with transition from supine to edge of bed in seated position.     Transfers Overall transfer level: Needs assistance Equipment used: Rolling walker (2 wheeled) Transfers: Sit to/from Stand Sit to Stand: Min guard         General transfer comment: Pt required cues for hand placement to improve safety during transition from sit to stand.  Cues to back entirely to seated surface before sitting.  Pt performed transfer from bed and BSC.    Ambulation/Gait Ambulation/Gait assistance: Min guard Ambulation Distance (Feet): 320 Feet Assistive device: Rolling walker (2 wheeled) Gait Pattern/deviations: Step-through pattern;Trunk flexed     General Gait Details: Cues to maintain safe position in rolling walker during backing and turns.     Stairs            Wheelchair Mobility    Modified Rankin (Stroke Patients Only)       Balance     Sitting balance-Leahy Scale: Fair       Standing balance-Leahy Scale: Fair                      Cognition Arousal/Alertness: Awake/alert Behavior During Therapy: Impulsive;WFL for tasks assessed/performed Overall Cognitive Status: Within Functional Limits for tasks assessed Area of Impairment: Attention;Safety/judgement       Following Commands: Follows one step commands consistently Safety/Judgement: Decreased awareness of safety;Decreased awareness of deficits          Exercises      General Comments        Pertinent Vitals/Pain Pain Assessment: No/denies pain    Home Living  Prior Function            PT Goals (current goals can now be found in the care plan section) Acute Rehab PT Goals Patient Stated Goal: feel better Potential to Achieve Goals: Fair Progress towards PT goals: Progressing toward goals    Frequency  Min 3X/week    PT Plan Current plan remains appropriate    Co-evaluation             End of Session Equipment Utilized During Treatment: Oxygen;Gait belt Activity Tolerance: Patient limited by fatigue Patient  left: in chair;with call bell/phone within reach     Time: 0930-1005 PT Time Calculation (min) (ACUTE ONLY): 35 min  Charges:  $Gait Training: 8-22 mins $Therapeutic Activity: 8-22 mins                    G Codes:      Cristela Blue 23-Jul-2015, 10:20 AM  Governor Rooks, PTA pager [phone]

## 2015-06-24 NOTE — Progress Notes (Signed)
Subjective:  Hemodynamically stable- straight cath yest of 475 cc's- also underwent HD removed 2900 with some transient low BP- no urge to pee this AM- has glasses on looking at phone Objective Vital signs in last 24 hours: Filed Vitals:   06/23/15 2123 06/24/15 0000 06/24/15 0400 06/24/15 0401  BP:    146/79  Pulse:    91  Temp: 98.9 F (37.2 C)   98.2 F (36.8 C)  TempSrc: Oral   Oral  Resp:  23 20 20   Height:      Weight:    89.3 kg (196 lb 13.9 oz)  SpO2:  97% 97% 96%   Weight change: 1.7 kg (3 lb 12 oz)  Intake/Output Summary (Last 24 hours) at 06/24/15 [id number] Last data filed at 06/23/15 1650  Gross per 24 hour  Intake    540 ml  Output   2894 ml  Net  -2354 ml    Assessment/ Plan: Pt is a 65 y.o. yo male who was admitted on 06/09/2015 with need for AAA repair with complication of AKI Assessment/Plan: 1. AKI- in the setting of a chronically nonfunctional right kdney and hemodynamic changes associated with AAA repair.  required CRRT 2/9- 2/13- then IHD 2/15 and 2/17 and 2/20- had nearly 500 of urine with straight cath- orders for bladder scan q shift and cath when needed- but numbers quite bad and feel  will need tunneled HD cath this week for continued HD.  Creatinine was 1.27 on 06/05/15 but has known chronically occluded and likely nonfunctional kidney on the right so not sure chances for recovery.  crt down to 6.2 with HD and BUN down to 80, will watch labs daily as well as UOP.  If not looking like recovering at end of week will send papers to get OP spot for dialysis, also being considered for inpatient rehab which could be good because we could watch a little longer 2. HTN/vol- overloaded with hyponatremia- with UF is improved 3. Anemia- due to surgery and CKD- - start aranesp and transfuse as needed- hgb down to 7.2 today may need blood 4. S./p AAA repair- per [facility]VS 5. Bones- phos down with HD only 6. Hypokalemia- getting 40 today- be careful     Ciarah Peace  A    Labs: Basic Metabolic Panel:  Recent Labs Lab 06/22/15 0350 06/23/15 0250 06/24/15 0340  NA 125* 122* 134*  K 4.0 4.5 3.4*  CL 91* 88* 96*  CO2 19* 16* 24  GLUCOSE 125* 123* 121*  BUN 108* 151* 80*  CREATININE 7.23* 9.52* 6.22*  CALCIUM 8.4* 8.6* 8.2*  PHOS 7.6* 8.1* 4.4   Liver Function Tests:  Recent Labs Lab 06/19/15 0341  06/21/15 0440 06/23/15 0250 06/24/15 0340  AST 23  --   --  16  --   ALT 37  --   --  27  --   ALKPHOS 108  --   --  115  --   BILITOT 0.5  --   --  0.8  --   PROT 5.5*  --   --  5.9*  --   ALBUMIN 1.9*  < > 1.7* 1.8* 1.7*  < > = values in this interval not displayed.  Recent Labs Lab 06/22/15 0350 06/23/15 0250 06/24/15 0340  LIPASE 130* 152* 122*  AMYLASE 113* 157* 145*   No results for input(s): AMMONIA in the last 168 hours. CBC:  Recent Labs Lab 06/20/15 0336 06/21/15 0440 06/22/15 0350 06/23/15 0250 06/24/15 0340  WBC 15.0* 13.4* 12.3* 11.8* 9.2  NEUTROABS  --   --   --  9.5*  --   HGB 8.0* 8.0* 8.6* 8.0* 7.2*  HCT 23.1* 23.7* 25.5* 23.5* 21.5*  MCV 82.2 81.4 82.0 81.0 82.1  PLT 181 196 242 239 250   Cardiac Enzymes: No results for input(s): CKTOTAL, CKMB, CKMBINDEX, TROPONINI in the last 168 hours. CBG:  Recent Labs Lab 06/23/15 0730 06/23/15 1131 06/23/15 2002 06/24/15 0047 06/24/15 0409  GLUCAP 123* 120* 117* 117* 120*    Iron Studies: No results for input(s): IRON, TIBC, TRANSFERRIN, FERRITIN in the last 72 hours. Studies/Results: No results found. Medications: Infusions: . sodium chloride 20 mL/hr at 06/23/15 1200  . TPN (CLINIMIX) Adult without lytes 83 mL/hr at 06/23/15 1850   And  . fat emulsion 240 mL (06/23/15 1851)  . feeding supplement (VITAL AF 1.2 CAL)      Scheduled Medications: . alteplase  2 mg Intracatheter Once  . antiseptic oral rinse  7 mL Mouth Rinse q12n4p  . chlorhexidine  15 mL Mouth Rinse BID  . darbepoetin (ARANESP) injection - NON-DIALYSIS  150 mcg Subcutaneous Q  Mon-1800  . enoxaparin (LOVENOX) injection  30 mg Subcutaneous Q24H  . insulin aspart  0-15 Units Subcutaneous 6 times per day  . metoprolol  5 mg Intravenous 4 times per day  . morphine   Intravenous 6 times per day  . pantoprazole (PROTONIX) IV  40 mg Intravenous Q24H  . sodium chloride flush  10-40 mL Intracatheter Q12H    have reviewed scheduled and prn medications.  Physical Exam: General: alert- looks good Heart: RRR Lungs: CBS vilat Abdomen: soft, non tender Extremities: minimal edema Dialysis Access: vascath placed 2/7 - needs to be converted    06/24/2015,8:03 AM  LOS: 15 days

## 2015-06-24 NOTE — Progress Notes (Signed)
PARENTERAL NUTRITION CONSULT NOTE - FOLLOW UP  Pharmacy Consult:  TPN Indication:  Prolonged ileus  No Known Allergies  Patient Measurements:  Height: 6' (182.9 cm) Weight: 196 lb 13.9 oz (89.3 kg) IBW/kg (Calculated) : 77.6  Vital Signs: Temp: 98.2 F (36.8 C) (02/21 0401) Temp Source: Oral (02/21 0401) BP: 146/79 mmHg (02/21 0401) Pulse Rate: 91 (02/21 0401) Intake/Output from previous day: 02/20 0701 - 02/21 0700 In: 675 [I.V.:100; TPN:575] Out: 2894 [Urine:475]  Labs:  Recent Labs  06/22/15 0350 06/23/15 0250 06/24/15 0340  WBC 12.3* 11.8* 9.2  HGB 8.6* 8.0* 7.2*  HCT 25.5* 23.5* 21.5*  PLT 242 239 250     Recent Labs  06/22/15 0350 06/23/15 0250 06/24/15 0340  NA 125* 122* 134*  K 4.0 4.5 3.4*  CL 91* 88* 96*  CO2 19* 16* 24  GLUCOSE 125* 123* 121*  BUN 108* 151* 80*  CREATININE 7.23* 9.52* 6.22*  CALCIUM 8.4* 8.6* 8.2*  MG 2.4 2.2 1.9  PHOS 7.6* 8.1* 4.4  PROT  --  5.9*  --   ALBUMIN  --  1.8* 1.7*  AST  --  16  --   ALT  --  27  --   ALKPHOS  --  115  --   BILITOT  --  0.8  --   PREALBUMIN  --  15.6*  --   TRIG  --  97  --    Estimated Creatinine Clearance: 13.5 mL/min (by C-G formula based on Cr of 6.22).    Recent Labs  06/23/15 2002 06/24/15 0047 06/24/15 0409  GLUCAP 117* 117* 120*     Insulin Requirements in the past 24 hours:  4 units SSI   Assessment: 65 YOM with history of occluded right renal artery discovered on 05/28/15 presented on 06/09/15 for AAA repair, left renal artery bypass and aortic endarterectomy.  Post-op course was complicated by respiratory failure requiring intubation and AKI requiring dialysis.  Pharmacy consulted to initiate TPN for prolonged ileus on 06/13/15.  GI: GERD - prealbumin 11.2 >> 15.6.  Post pyloric feeding tube placed on 2/17, TF held d/t retching and nausea from pancreatitis.  +flatus, PPI IV Endo: TSH WNL.  No hx of DM - CBGs well controlled with minimal SSI use Lytes: no lytes in TPN except  NaCL - K+ low at 3.4 (goal >/= 4 for ileus), Mag 1.9 (goal >/= 2 for ileus), NaCL added 2/20 and labs have improved significantly (appears dilutional as well), Phos 4.4 post dialysis (Ca x Phos = 44, goal < 55) Renal: AKI s/p HD x1 on 2/8, then CRRT 2/9 >> 2/14, iHD resumed on 2/15 (BL SCr 1.2), BUN down to 80 post dialysis - no UOP, NS at 20 ml/hr.  Tunneled dialysis cath this week. Pulm: hx tobacco use, intubated 2/10 - stable on RA Cards: ASCVD / HTN / Afib / PVD / HLD - BP high normal, some tachy - Lopressor IV, off amiodarone gtt Hepatobil: amylase and lipase ipmroving, LFTs normalized, tbili WNL.  TG 449 >> 97 AC/Heme: Coumadin for Afib stopped 2/1 PTA - HIT Ab negative - hgb decreased to 7.2, and plts recovered - Aranesp qMon (last dose 2/20) Neuro: hx CVA.  Frequent usage of PRN Fentanyl - morphine PCA, pain score 1 ID: s/p tx for HCAP - afebrile, WBC normalized, PCT 5.29  Zosyn 2/10 >> 2/13 Vanc 2/10 >> 2/13 CTX 2/13 >> 2/16  2/10 endotracheal cx - Serratia (sensitive to CTX, Septra, Cipro)  Best Practices:  Lovenox, MC TPN Access: CVC triple lumen placed 06/13/15 TPN start date: 2/10 >>  Current Nutrition:  TPN Clear liquid diet  Nutritional Goals: 2100-2300 kCal and 135-145 gm protein per day    Plan:  - Continue Clinimix 5/15 (no electrolytes) at 83 ml/hr + ILE 20% at 10 ml/hr.  TPN provides 1894 kCal and 100gm protein daily, meeting 90% of minimal kCal and 74% of minimal protein needs. Although Clinimix at 115 ml/hr + ILE 20% on MWF were providing ~1.5 g/kg of protein per day and was appropriate for patient on dialysis, provision was reduced due to elevated BUN, now providing ~1.1 g/kg/day of protein. - Continue TPN with 154 mEq NaCL per liter.  F/U Na+ correction and increase NaCL to make Clinimix isotonic if necessary. - Daily multivitamin and trace elements - Continue moderate SSI - F/U change Lovenox to heparin SQ given ESRD - KCL x 4 runs - Mag sulfate 1gm IV x 1 -  F/U AM labs    Marciano Mundt D. Mina Marble, PharmD, BCPS Pager:  [phone] 06/24/2015, 8:07 AM

## 2015-06-25 LAB — CULTURE, BLOOD (ROUTINE X 2)
Culture: NO GROWTH
Culture: NO GROWTH
Specimen Description: NEGATIVE

## 2015-06-25 LAB — RENAL FUNCTION PANEL
Albumin: 1.7 g/dL — ABNORMAL LOW (ref 3.5–5.0)
Anion gap: 13 (ref 5–15)
BUN: 111 mg/dL — ABNORMAL HIGH (ref 6–20)
CO2: 19 mmol/L — ABNORMAL LOW (ref 22–32)
Calcium: 8.5 mg/dL — ABNORMAL LOW (ref 8.9–10.3)
Chloride: 96 mmol/L — ABNORMAL LOW (ref 101–111)
Creatinine, Ser: 8.04 mg/dL — ABNORMAL HIGH (ref 0.61–1.24)
GFR calc Af Amer: 7 mL/min — ABNORMAL LOW (ref >60)
GFR calc non Af Amer: 6 mL/min — ABNORMAL LOW (ref >60)
Glucose, Bld: 135 mg/dL — ABNORMAL HIGH (ref 65–99)
Phosphorus: 5.8 mg/dL — ABNORMAL HIGH (ref 2.5–4.6)
Potassium: 3.8 mmol/L (ref 3.5–5.1)
Sodium: 128 mmol/L — ABNORMAL LOW (ref 135–145)

## 2015-06-25 LAB — CBC
HCT: 26.7 % — ABNORMAL LOW (ref 39.0–52.0)
Hemoglobin: 8.9 g/dL — ABNORMAL LOW (ref 13.0–17.0)
MCH: 27.7 pg (ref 26.0–34.0)
MCHC: 33.3 g/dL (ref 30.0–36.0)
MCV: 83.2 fL (ref 78.0–100.0)
Platelets: 258 10*3/uL (ref 150–400)
RBC: 3.21 MIL/uL — ABNORMAL LOW (ref 4.22–5.81)
RDW: 15.7 % — ABNORMAL HIGH (ref 11.5–15.5)
WBC: 9.7 10*3/uL (ref 4.0–10.5)

## 2015-06-25 LAB — TYPE AND SCREEN
ABO/RH(D): B NEG
Antibody Screen: NEGATIVE
Unit division: 0
Unit division: 0

## 2015-06-25 LAB — CULTURE, BLOOD (SINGLE)
Culture: NO GROWTH
Specimen Description: NEGATIVE

## 2015-06-25 LAB — MAGNESIUM: Magnesium: 2 mg/dL (ref 1.7–2.4)

## 2015-06-25 LAB — GLUCOSE, CAPILLARY
Glucose-Capillary: 117 mg/dL — ABNORMAL HIGH (ref 65–99)
Glucose-Capillary: 124 mg/dL — ABNORMAL HIGH (ref 65–99)
Glucose-Capillary: 125 mg/dL — ABNORMAL HIGH (ref 65–99)
Glucose-Capillary: 126 mg/dL — ABNORMAL HIGH (ref 65–99)
Glucose-Capillary: 130 mg/dL — ABNORMAL HIGH (ref 65–99)
Glucose-Capillary: 85 mg/dL (ref 65–99)

## 2015-06-25 MED ORDER — ATORVASTATIN CALCIUM 20 MG PO TABS
20.0000 mg | ORAL_TABLET | Freq: Every day | ORAL | Status: DC
  Administered 2015-06-25 – 2015-07-06 (×12): 20 mg via ORAL
  Filled 2015-06-25 (×12): qty 1

## 2015-06-25 MED ORDER — METOPROLOL TARTRATE 25 MG PO TABS
25.0000 mg | ORAL_TABLET | Freq: Two times a day (BID) | ORAL | Status: DC
  Administered 2015-06-25 – 2015-07-01 (×9): 25 mg via ORAL
  Filled 2015-06-25 (×11): qty 1

## 2015-06-25 MED ORDER — METOPROLOL SUCCINATE ER 25 MG PO TB24: 25.0000 mg | ORAL_TABLET | Freq: Every day | ORAL | Status: DC

## 2015-06-25 MED ORDER — WHITE PETROLATUM GEL
Status: AC
  Filled 2015-06-25: qty 1

## 2015-06-25 MED ORDER — OXYCODONE-ACETAMINOPHEN 5-325 MG PO TABS
1.0000 | ORAL_TABLET | ORAL | Status: DC | PRN
  Administered 2015-06-27 – 2015-07-07 (×33): 2 via ORAL
  Filled 2015-06-25 (×33): qty 2

## 2015-06-25 NOTE — Progress Notes (Signed)
Pt states pain is well controlled, only used PCA button twice last night.  Received VO from Dr. Bridgett Larsson to discontinue pt PCA. Morphine pump turned off.

## 2015-06-25 NOTE — Progress Notes (Signed)
CSW was following for PT recommendation for SNF.  Pt walking 320 ft min guard with rolling walker- currently not a candidate for SNF unless he has continuing medical needs- in progression informed pt is being weaned from TPN at this time so will likely not have any remaining skillable needs.  Discussed case with RNCM who is follow for potential home health needs.  CSW signing off at this time- please reconsult if pt needs change.  Domenica Reamer, [place] Social Worker [phone]

## 2015-06-25 NOTE — Progress Notes (Signed)
[facility] Progress Note  Subjective  - POD #16  No complaints today. Feels the urge to void when walking.   Objective Filed Vitals:   06/25/15 0400 06/25/15 0442  BP: 133/64   Pulse: 97   Temp: 98.5 F (36.9 C)   Resp: 12 12    Intake/Output Summary (Last 24 hours) at 06/25/15 0741 Last data filed at 06/25/15 0700  Gross per 24 hour  Intake 5883.17 ml  Output    400 ml  Net 5483.17 ml    Abdomen soft and nontender Midline laparotomy incision c/d/i Feet warm bilaterally   Assessment/Planning: 65 y.o. male is s/p: repair of juxtarenal AAA, left renal bypass, pancreatitis, AKI 16 Days Post-Op   Anemia: Hgb improved after 2 units pRBCs. AKI: 400 mL UOP with straight cath yesterday. For [facility] tomorrow.  Tolerating clear liquids. Consuming about 50% of meals. Advance diet today. Hold TPN.  Restart home meds.  Afib: rate 90s Continue heparin, plan to restart coumadin after [facility] tomorrow.  Mobilize, encourage IS and flutter valve.   Alvia Grove 06/25/2015 7:41 AM --  Laboratory CBC    Component Value Date/Time   WBC 9.7 06/25/2015 0435   HGB 8.9* 06/25/2015 0435   HCT 26.7* 06/25/2015 0435   PLT 258 06/25/2015 0435    BMET    Component Value Date/Time   NA 128* 06/25/2015 0435   K 3.8 06/25/2015 0435   CL 96* 06/25/2015 0435   CO2 19* 06/25/2015 0435   GLUCOSE 135* 06/25/2015 0435   BUN 111* 06/25/2015 0435   CREATININE 8.04* 06/25/2015 0435   CREATININE 1.10 11/07/2014 0804   CALCIUM 8.5* 06/25/2015 0435   GFRNONAA 6* 06/25/2015 0435   GFRNONAA 53* 05/14/2013 0905   GFRAA 7* 06/25/2015 0435   GFRAA 62 05/14/2013 0905    COAG Lab Results  Component Value Date   INR 1.39 06/10/2015   INR 1.40 06/09/2015   INR 1.07 06/09/2015   No results found for: PTT  Antibiotics Anti-infectives    Start     Dose/Rate Route Frequency Ordered Stop   06/16/15 1300  cefTRIAXone (ROCEPHIN) 1 g in dextrose 5 % 50 mL IVPB     1 g 100  mL/hr over 30 Minutes Intravenous Every 24 hours 06/16/15 1154 06/19/15 1358   06/14/15 1200  vancomycin (VANCOCIN) IVPB 1000 mg/200 mL premix  Status:  Discontinued     1,000 mg 200 mL/hr over 60 Minutes Intravenous Every 24 hours 06/13/15 1014 06/15/15 1101   06/13/15 1200  piperacillin-tazobactam (ZOSYN) IVPB 3.375 g  Status:  Discontinued     3.375 g 100 mL/hr over 30 Minutes Intravenous 4 times per day 06/13/15 1014 06/16/15 1154   06/13/15 1015  vancomycin (VANCOCIN) 1,500 mg in sodium chloride 0.9 % 250 mL IVPB     1,500 mg 250 mL/hr over 60 Minutes Intravenous  Once 06/13/15 1014 06/13/15 1143   06/09/15 2000  cefUROXime (ZINACEF) 1.5 g in dextrose 5 % 50 mL IVPB     1.5 g 100 mL/hr over 30 Minutes Intravenous Every 12 hours 06/09/15 1522 06/10/15 0855   06/09/15 1130  cefUROXime (ZINACEF) 1.5 g in dextrose 5 % 50 mL IVPB  Status:  Discontinued     1.5 g 100 mL/hr over 30 Minutes Intravenous To Surgery 06/09/15 1123 06/09/15 1509   06/09/15 0700  cefUROXime (ZINACEF) 1.5 g in dextrose 5 % 50 mL IVPB     1.5 g 100 mL/hr over 30 Minutes  Intravenous To [facility] Surgical 06/08/15 1434 06/09/15 [place], [place] [facility] Office: [phone] Pager: [phone] 06/25/2015 7:41 AM   Addendum  I have independently interviewed and examined the patient, and I agree with the physician assistant's findings.  D/C TPN.  D/C PCA.  Ok response to 2 unit transfusion.  Some renal recovery.  Renal still thinks TDC needed.  TDC placement tomorrow.  Cont PT/OT.  Wean oxygen.  Adele Barthel, MD [facility] [place] Office: [phone] Pager: [phone]  06/25/2015, 8:11 AM

## 2015-06-25 NOTE — Progress Notes (Signed)
Subjective:  Hemodynamically stable- straight cath yest of another 400 cc's- no new complaints- see plans for [facility] tomorrow Objective Vital signs in last 24 hours: Filed Vitals:   06/25/15 0000 06/25/15 0025 06/25/15 0400 06/25/15 0442  BP: 167/80  133/64   Pulse: 83  97   Temp: 98.5 F (36.9 C)  98.5 F (36.9 C)   TempSrc: Oral  Oral   Resp: 20 14 12 12   Height:      Weight:    97.8 kg (215 lb 9.8 oz)  SpO2: 95% 96% 94% 94%   Weight change: 7.3 kg (16 lb 1.5 oz)  Intake/Output Summary (Last 24 hours) at 06/25/15 0836 Last data filed at 06/25/15 0700  Gross per 24 hour  Intake 5883.17 ml  Output    400 ml  Net 5483.17 ml    Assessment/ Plan: Pt is a 65 y.o. yo male who was admitted on 06/09/2015 with need for AAA repair with complication of AKI Assessment/Plan: 1. A on CRF- crt of 1.27 on 2/2-  in the setting of a chronically nonfunctional right kdney and hemodynamic changes associated with AAA repair and left renal artery bypass on 2/6.  required CRRT 2/9- 2/13- then IHD 2/15 , 2/17  2/20- had nearly 500 of urine with straight cath on Monday, then 400 yesterday- orders for bladder scan q shift and cath when needed- but numbers quite bad and worsening in between HD treamtents so feel  will need tunneled HD cath this week for continued HD- this is planned for Thursday.   Will do HD today given BUN climbing by 30 over last 24 hours and procedure planned for tomorrow.  If not looking like recovering at end of week will send papers to get OP spot for dialysis.   2. HTN/vol- overloaded with hyponatremia- with UF  improves 3. Anemia- due to surgery and CKD- - start aranesp and transfuse as needed-  4. S./p AAA repair and renal artery bypass - per VVS 5. Bones- phos down with HD only 6. Hypokalemia- will do high K bath 7. GI issues- requiring TNA but plan is to wean him off     [place]: Basic Metabolic Panel:  Recent Labs Lab 06/23/15 0250 06/24/15 0340  06/25/15 0435  NA 122* 134* 128*  K 4.5 3.4* 3.8  CL 88* 96* 96*  CO2 16* 24 19*  GLUCOSE 123* 121* 135*  BUN 151* 80* 111*  CREATININE 9.52* 6.22* 8.04*  CALCIUM 8.6* 8.2* 8.5*  PHOS 8.1* 4.4 5.8*   Liver Function Tests:  Recent Labs Lab 06/19/15 0341  06/23/15 0250 06/24/15 0340 06/25/15 0435  AST 23  --  16  --   --   ALT 37  --  27  --   --   ALKPHOS 108  --  115  --   --   BILITOT 0.5  --  0.8  --   --   PROT 5.5*  --  5.9*  --   --   ALBUMIN 1.9*  < > 1.8* 1.7* 1.7*  < > = values in this interval not displayed.  Recent Labs Lab 06/22/15 0350 06/23/15 0250 06/24/15 0340  LIPASE 130* 152* 122*  AMYLASE 113* 157* 145*   No results for input(s): AMMONIA in the last 168 hours. CBC:  Recent Labs Lab 06/21/15 0440 06/22/15 0350 06/23/15 0250 06/24/15 0340 06/25/15 0435  WBC 13.4* 12.3* 11.8* 9.2 9.7  NEUTROABS  --   --  9.5*  --   --   HGB 8.0* 8.6* 8.0* 7.2* 8.9*  HCT 23.7* 25.5* 23.5* 21.5* 26.7*  MCV 81.4 82.0 81.0 82.1 83.2  PLT 196 242 239 250 258   Cardiac Enzymes: No results for input(s): CKTOTAL, CKMB, CKMBINDEX, TROPONINI in the last 168 hours. CBG:  Recent Labs Lab 06/24/15 1558 06/24/15 2203 06/25/15 0001 06/25/15 0414 06/25/15 0804  GLUCAP 110* 131* 117* 130* 124*    Iron Studies: No results for input(s): IRON, TIBC, TRANSFERRIN, FERRITIN in the last 72 hours. Studies/Results: No results found. Medications: Infusions: . sodium chloride 20 mL/hr at 06/24/15 2300  . feeding supplement (VITAL AF 1.2 CAL)      Scheduled Medications: . alteplase  2 mg Intracatheter Once  . antiseptic oral rinse  7 mL Mouth Rinse q12n4p  . atorvastatin  20 mg Oral QHS  . chlorhexidine  15 mL Mouth Rinse BID  . darbepoetin (ARANESP) injection - NON-DIALYSIS  150 mcg Subcutaneous Q Mon-1800  . enoxaparin (LOVENOX) injection  30 mg Subcutaneous Q24H  . insulin aspart  0-15 Units Subcutaneous 6 times per day  . metoprolol tartrate  25 mg Oral BID   . pantoprazole (PROTONIX) IV  40 mg Intravenous Q24H  . sodium chloride flush  10-40 mL Intracatheter Q12H  . white petrolatum        have reviewed scheduled and prn medications.  Physical Exam: General: alert- looks good Heart: RRR Lungs: CBS bilat Abdomen: soft, non tender Extremities: minimal edema Dialysis Access: vascath placed 2/7 - needs to be converted    06/25/2015,8:36 AM  LOS: 16 days

## 2015-06-25 NOTE — Progress Notes (Signed)
Received call from pharmacy that we are weaning pt off of TPN, decrease rate to 40cc/hr for D/C later this evening.

## 2015-06-25 NOTE — Consult Note (Signed)
   [facility] CM Inpatient Consult   06/25/2015  [name] [name] Jun 15, 1950 [id number]   Patient screened for [place] Management program services. Went to bedside to discuss [place] Management. However, patient currently off the unit. Will come back at later time. Will make inpatient RNCM aware of attempt to engage patient for [place] Management services.   Marthenia Rolling, MSN-Ed, RN,BSN [facility] Liaison [phone]

## 2015-06-25 NOTE — Progress Notes (Signed)
Nutrition Follow Up  DOCUMENTATION CODES:   Not applicable  INTERVENTION:    Continue Renal/Carbohydrate Modified diet  NUTRITION DIAGNOSIS:   No nutrition diagnosis at this time  GOAL:   Patient will meet greater than or equal to 90% of their needs, met  MONITOR:   PO intake, Weight trends, Labs, I & O's  ASSESSMENT:   65 yo Male with PMH as below, which is significant HTN, Atrial Fib on warfarin, CVA, and known AAA. He was recently admitted to [facility] ED 1/20 with complaints of abdominal pain. CT abdomen at that time showed evidence of hypoperfusion to the R kidney. CT angio showed some enlargement of the seat of an aneurysm. 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 It was on that day that he presented, and underwent the procedures. Estimated 4L blood loss during case. Post operatively he remains on vent in surgical ICU.   Patient s/p procedure 2/6: REPAIR OF JUXTARENAL ABDOMINAL AORTIC ANEURYSM, LEFT RENAL BYPASS  Patient extubated 2/12; continues on HD. TF (Vital AF 1.2 formula) D/C'd via small bore feeding tube 2/19. Pt is currently weaning off TPN. PO intake 100% per flowsheet records. Plan for tunneled HD catheter later this week.  Diet Order:  Diet renal/carb modified with fluid restriction Diet-HS Snack?: Nothing; Room service appropriate?: Yes; Fluid consistency:: Thin Diet NPO time specified Except for: Sips with Meds  Skin:  Reviewed, no issues  Last BM:  2/22  Height:   Ht Readings from Last 1 Encounters:  06/09/15 6' (1.829 m)     Weight:   Wt Readings from Last 1 Encounters:  06/25/15 216 lb 4.3 oz (98.1 kg)    Ideal Body Weight:  81 kg  BMI:  Body mass index is 29.33 kg/(m^2).  Estimated Nutritional Needs:   Kcal:  2100-2300  Protein:  135-145 gm  Fluid:  per MD  EDUCATION NEEDS:   No education needs identified at this time  Arthur Holms, RD, LDN Pager #: [phone] After-Hours Pager #: [phone]

## 2015-06-26 ENCOUNTER — Inpatient Hospital Stay (HOSPITAL_COMMUNITY): Payer: Medicare Other | Admitting: Certified Registered Nurse Anesthetist

## 2015-06-26 ENCOUNTER — Encounter (HOSPITAL_COMMUNITY)
Admission: RE | Disposition: A | Discharge status: Home Health | Payer: Self-pay | Source: Ambulatory Visit | Attending: Vascular Surgery

## 2015-06-26 ENCOUNTER — Inpatient Hospital Stay (HOSPITAL_COMMUNITY): Payer: Medicare Other

## 2015-06-26 DIAGNOSIS — N185 Chronic kidney disease, stage 5: Secondary | ICD-10-CM

## 2015-06-26 HISTORY — PX: INSERTION OF DIALYSIS CATHETER: SHX1324

## 2015-06-26 LAB — BASIC METABOLIC PANEL
Anion gap: 14 (ref 5–15)
BUN: 60 mg/dL — ABNORMAL HIGH (ref 6–20)
CO2: 22 mmol/L (ref 22–32)
Calcium: 8.5 mg/dL — ABNORMAL LOW (ref 8.9–10.3)
Chloride: 99 mmol/L — ABNORMAL LOW (ref 101–111)
Creatinine, Ser: 5.91 mg/dL — ABNORMAL HIGH (ref 0.61–1.24)
GFR calc Af Amer: 11 mL/min — ABNORMAL LOW (ref >60)
GFR calc non Af Amer: 9 mL/min — ABNORMAL LOW (ref >60)
Glucose, Bld: 107 mg/dL — ABNORMAL HIGH (ref 65–99)
Potassium: 3.8 mmol/L (ref 3.5–5.1)
Sodium: 135 mmol/L (ref 135–145)

## 2015-06-26 LAB — GLUCOSE, CAPILLARY
Glucose-Capillary: 104 mg/dL — ABNORMAL HIGH (ref 65–99)
Glucose-Capillary: 113 mg/dL — ABNORMAL HIGH (ref 65–99)
Glucose-Capillary: 87 mg/dL (ref 65–99)
Glucose-Capillary: 95 mg/dL (ref 65–99)
Glucose-Capillary: 97 mg/dL (ref 65–99)
Glucose-Capillary: 98 mg/dL (ref 65–99)

## 2015-06-26 LAB — CBC
HCT: 25.8 % — ABNORMAL LOW (ref 39.0–52.0)
Hemoglobin: 8.5 g/dL — ABNORMAL LOW (ref 13.0–17.0)
MCH: 27.5 pg (ref 26.0–34.0)
MCHC: 32.9 g/dL (ref 30.0–36.0)
MCV: 83.5 fL (ref 78.0–100.0)
Platelets: 266 10*3/uL (ref 150–400)
RBC: 3.09 MIL/uL — ABNORMAL LOW (ref 4.22–5.81)
RDW: 16.1 % — ABNORMAL HIGH (ref 11.5–15.5)
WBC: 12.4 10*3/uL — ABNORMAL HIGH (ref 4.0–10.5)

## 2015-06-26 LAB — MAGNESIUM: Magnesium: 1.9 mg/dL (ref 1.7–2.4)

## 2015-06-26 SURGERY — INSERTION OF DIALYSIS CATHETER
Anesthesia: Monitor Anesthesia Care | Site: Neck | Laterality: Left

## 2015-06-26 MED ORDER — SUCCINYLCHOLINE CHLORIDE 20 MG/ML IJ SOLN
INTRAMUSCULAR | Status: AC
  Filled 2015-06-26: qty 1

## 2015-06-26 MED ORDER — SODIUM CHLORIDE 0.9 % IJ SOLN
INTRAMUSCULAR | Status: AC
  Filled 2015-06-26: qty 10

## 2015-06-26 MED ORDER — ATROPINE SULFATE 0.1 MG/ML IJ SOLN
INTRAMUSCULAR | Status: AC
  Filled 2015-06-26: qty 10

## 2015-06-26 MED ORDER — CALCIUM CHLORIDE 10 % IV SOLN
INTRAVENOUS | Status: AC
  Filled 2015-06-26: qty 10

## 2015-06-26 MED ORDER — LIDOCAINE-EPINEPHRINE (PF) 1 %-1:200000 IJ SOLN
INTRAMUSCULAR | Status: DC | PRN
  Administered 2015-06-26: 15 mL

## 2015-06-26 MED ORDER — HEPARIN SODIUM (PORCINE) 1000 UNIT/ML IJ SOLN
INTRAMUSCULAR | Status: AC
  Filled 2015-06-26: qty 1

## 2015-06-26 MED ORDER — VECURONIUM BROMIDE 10 MG IV SOLR
INTRAVENOUS | Status: AC
  Filled 2015-06-26: qty 10

## 2015-06-26 MED ORDER — STERILE WATER FOR INJECTION IJ SOLN
INTRAMUSCULAR | Status: AC
  Filled 2015-06-26: qty 10

## 2015-06-26 MED ORDER — EPINEPHRINE HCL 0.1 MG/ML IJ SOSY
PREFILLED_SYRINGE | INTRAMUSCULAR | Status: AC
  Filled 2015-06-26: qty 10

## 2015-06-26 MED ORDER — LIDOCAINE-EPINEPHRINE (PF) 1 %-1:200000 IJ SOLN
INTRAMUSCULAR | Status: AC
  Filled 2015-06-26: qty 30

## 2015-06-26 MED ORDER — PROTAMINE SULFATE 10 MG/ML IV SOLN
INTRAVENOUS | Status: AC
  Filled 2015-06-26: qty 25

## 2015-06-26 MED ORDER — ARTIFICIAL TEARS OP OINT
TOPICAL_OINTMENT | OPHTHALMIC | Status: AC
  Filled 2015-06-26: qty 3.5

## 2015-06-26 MED ORDER — NEOSTIGMINE METHYLSULFATE 10 MG/10ML IV SOLN
INTRAVENOUS | Status: AC
  Filled 2015-06-26: qty 1

## 2015-06-26 MED ORDER — GLYCOPYRROLATE 0.2 MG/ML IJ SOLN
INTRAMUSCULAR | Status: AC
  Filled 2015-06-26: qty 1

## 2015-06-26 MED ORDER — DEXTROSE 5 % IV SOLN
1.5000 g | INTRAVENOUS | Status: AC
  Administered 2015-06-26: 1.5 g via INTRAVENOUS
  Filled 2015-06-26: qty 1.5

## 2015-06-26 MED ORDER — PHENYLEPHRINE HCL 10 MG/ML IJ SOLN
INTRAMUSCULAR | Status: AC
  Filled 2015-06-26: qty 1

## 2015-06-26 MED ORDER — MIDAZOLAM HCL 2 MG/2ML IJ SOLN
INTRAMUSCULAR | Status: AC
  Filled 2015-06-26: qty 2

## 2015-06-26 MED ORDER — ETOMIDATE 2 MG/ML IV SOLN
INTRAVENOUS | Status: AC
  Filled 2015-06-26: qty 10

## 2015-06-26 MED ORDER — FENTANYL CITRATE (PF) 100 MCG/2ML IJ SOLN
INTRAMUSCULAR | Status: DC | PRN
  Administered 2015-06-26 (×2): 50 ug via INTRAVENOUS

## 2015-06-26 MED ORDER — PROTAMINE SULFATE 10 MG/ML IV SOLN
INTRAVENOUS | Status: AC
  Filled 2015-06-26: qty 5

## 2015-06-26 MED ORDER — PHENYLEPHRINE 40 MCG/ML (10ML) SYRINGE FOR IV PUSH (FOR BLOOD PRESSURE SUPPORT)
PREFILLED_SYRINGE | INTRAVENOUS | Status: AC
  Filled 2015-06-26: qty 20

## 2015-06-26 MED ORDER — SODIUM CHLORIDE 0.9 % IV SOLN
INTRAVENOUS | Status: DC | PRN
  Administered 2015-06-26: 500 mL

## 2015-06-26 MED ORDER — EPHEDRINE SULFATE 50 MG/ML IJ SOLN
INTRAMUSCULAR | Status: AC
  Filled 2015-06-26: qty 1

## 2015-06-26 MED ORDER — FENTANYL CITRATE (PF) 100 MCG/2ML IJ SOLN: 25.0000 ug | INTRAMUSCULAR | Status: DC | PRN

## 2015-06-26 MED ORDER — HEPARIN SODIUM (PORCINE) 1000 UNIT/ML IJ SOLN
INTRAMUSCULAR | Status: DC | PRN
  Administered 2015-06-26: 3.9 mL via INTRAVENOUS

## 2015-06-26 MED ORDER — SODIUM CHLORIDE 0.9 % IV SOLN
INTRAVENOUS | Status: DC
  Administered 2015-06-26: 10:00:00 via INTRAVENOUS

## 2015-06-26 MED ORDER — LIDOCAINE HCL (CARDIAC) 20 MG/ML IV SOLN
INTRAVENOUS | Status: AC
  Filled 2015-06-26: qty 5

## 2015-06-26 MED ORDER — FENTANYL CITRATE (PF) 250 MCG/5ML IJ SOLN
INTRAMUSCULAR | Status: AC
  Filled 2015-06-26: qty 5

## 2015-06-26 MED ORDER — ROCURONIUM BROMIDE 50 MG/5ML IV SOLN
INTRAVENOUS | Status: AC
  Filled 2015-06-26: qty 2

## 2015-06-26 MED ORDER — ONDANSETRON HCL 4 MG/2ML IJ SOLN
INTRAMUSCULAR | Status: AC
  Filled 2015-06-26: qty 2

## 2015-06-26 MED ORDER — MIDAZOLAM HCL 5 MG/5ML IJ SOLN
INTRAMUSCULAR | Status: DC | PRN
  Administered 2015-06-26 (×2): 1 mg via INTRAVENOUS

## 2015-06-26 SURGICAL SUPPLY — 38 items
BAG DECANTER FOR FLEXI CONT (MISCELLANEOUS) ×2 IMPLANT
BIOPATCH RED 1 DISK 7.0 (GAUZE/BANDAGES/DRESSINGS) ×2 IMPLANT
CATH PALINDROME RT-P 15FX19CM (CATHETERS) IMPLANT
CATH PALINDROME RT-P 15FX23CM (CATHETERS) IMPLANT
CATH PALINDROME RT-P 15FX28CM (CATHETERS) ×2 IMPLANT
CATH PALINDROME RT-P 15FX55CM (CATHETERS) IMPLANT
CHLORAPREP W/TINT 26ML (MISCELLANEOUS) ×2 IMPLANT
COVER PROBE W GEL 5X96 (DRAPES) ×2 IMPLANT
DRAPE C-ARM 42X72 X-RAY (DRAPES) ×2 IMPLANT
DRAPE CHEST BREAST 15X10 FENES (DRAPES) ×2 IMPLANT
GAUZE SPONGE 2X2 8PLY STRL LF (GAUZE/BANDAGES/DRESSINGS) ×1 IMPLANT
GAUZE SPONGE 4X4 16PLY XRAY LF (GAUZE/BANDAGES/DRESSINGS) ×2 IMPLANT
GLOVE BIO SURGEON STRL SZ 6.5 (GLOVE) ×2 IMPLANT
GLOVE BIO SURGEON STRL SZ7.5 (GLOVE) ×2 IMPLANT
GLOVE BIOGEL PI IND STRL 6.5 (GLOVE) ×2 IMPLANT
GLOVE BIOGEL PI IND STRL 8 (GLOVE) ×1 IMPLANT
GLOVE BIOGEL PI INDICATOR 6.5 (GLOVE) ×2
GLOVE BIOGEL PI INDICATOR 8 (GLOVE) ×1
GOWN STRL REUS W/ TWL LRG LVL3 (GOWN DISPOSABLE) ×2 IMPLANT
GOWN STRL REUS W/TWL LRG LVL3 (GOWN DISPOSABLE) ×2
KIT BASIN OR (CUSTOM PROCEDURE TRAY) ×2 IMPLANT
KIT ROOM TURNOVER OR (KITS) ×2 IMPLANT
LIQUID BAND (GAUZE/BANDAGES/DRESSINGS) ×2 IMPLANT
NEEDLE 18GX1X1/2 (RX/OR ONLY) (NEEDLE) ×2 IMPLANT
NEEDLE 22X1 1/2 (OR ONLY) (NEEDLE) ×2 IMPLANT
NEEDLE HYPO 25GX1X1/2 BEV (NEEDLE) ×2 IMPLANT
NS IRRIG 1000ML POUR BTL (IV SOLUTION) ×2 IMPLANT
PACK SURGICAL SETUP 50X90 (CUSTOM PROCEDURE TRAY) ×2 IMPLANT
PAD ARMBOARD 7.5X6 YLW CONV (MISCELLANEOUS) ×4 IMPLANT
SPONGE GAUZE 2X2 STER 10/PKG (GAUZE/BANDAGES/DRESSINGS) ×1
SUT ETHILON 3 0 PS 1 (SUTURE) ×2 IMPLANT
SUT VICRYL 4-0 PS2 18IN ABS (SUTURE) ×2 IMPLANT
SYR 20CC LL (SYRINGE) ×4 IMPLANT
SYR 5ML LL (SYRINGE) ×4 IMPLANT
SYR CONTROL 10ML LL (SYRINGE) ×2 IMPLANT
SYRINGE 10CC LL (SYRINGE) ×2 IMPLANT
TAPE CLOTH SURG 4X10 WHT LF (GAUZE/BANDAGES/DRESSINGS) ×2 IMPLANT
WATER STERILE IRR 1000ML POUR (IV SOLUTION) ×2 IMPLANT

## 2015-06-26 NOTE — Anesthesia Preprocedure Evaluation (Addendum)
Anesthesia Evaluation  Patient identified by MRN, date of birth, ID band Patient awake    Reviewed: Allergy & Precautions, H&P , NPO status , Patient's Chart, lab work & pertinent test results, reviewed documented beta blocker date and time   Airway Mallampati: II  TM Distance: >3 FB Neck ROM: Full    Dental no notable dental hx. (+) Edentulous Upper, Dental Advisory Given   Pulmonary neg pulmonary ROS, former smoker,    Pulmonary exam normal breath sounds clear to auscultation       Cardiovascular hypertension, Pt. on medications and Pt. on home beta blockers + CAD, + Past MI, + Peripheral Vascular Disease and +CHF   Rhythm:Irregular Rate:Normal     Neuro/Psych negative neurological ROS  negative psych ROS   GI/Hepatic Neg liver ROS, GERD  Medicated and Controlled,  Endo/Other  negative endocrine ROS  Renal/GU DialysisRenal disease  negative genitourinary   Musculoskeletal   Abdominal   Peds  Hematology negative hematology ROS (+)   Anesthesia Other Findings   Reproductive/Obstetrics negative OB ROS                            Anesthesia Physical Anesthesia Plan  ASA: III  Anesthesia Plan: MAC   Post-op Pain Management:    Induction: Intravenous  Airway Management Planned: Simple Face Mask  Additional Equipment:   Intra-op Plan:   Post-operative Plan:   Informed Consent: I have reviewed the patients History and Physical, chart, labs and discussed the procedure including the risks, benefits and alternatives for the proposed anesthesia with the patient or authorized representative who has indicated his/her understanding and acceptance.   Dental advisory given  Plan Discussed with: CRNA  Anesthesia Plan Comments:         Anesthesia Quick Evaluation

## 2015-06-26 NOTE — Progress Notes (Signed)
   06/26/15 0900  PT Visit Information  Reason Eval/Treat Not Completed Patient at procedure or test/unavailable (Pt to OR for catheter placement, will require follow up tomorrow. )

## 2015-06-26 NOTE — Progress Notes (Signed)
Right IJ catheter was removed at 1800. Catheter was intact. Pressure was applied for 15 minutes and no bleeding was noted. Pressure dressing has been applied. Pt has been instructed to lay flat for 30 minutes. Pt tolerated catheter removal well. Pt is in bed with call light and phone within reach. Will continue to monitor.   Grant Fontana RN, BSN

## 2015-06-26 NOTE — Op Note (Signed)
06/26/2015  PREOP DIAGNOSIS: Chronic kidney disease  POSTOP DIAGNOSIS: Chronic kidney disease  PROCEDURE:  1. Removal of temporary L IJ dialysis catheter 2. Ultrasound guided placement of left IJ tunneled dialysis catheter  (28 cm)  SURGEON: Judeth Cornfield. Scot Dock, MD, FACS  ASSIST: none  ANESTHESIA: local with sedation   EBL: minimal  FINDINGS: patent Left IJ  INDICATIONS: Need for HD access  TECHNIQUE: The patient was taken to the operating room and sedated by anesthesia. The neck and upper chest were prepped and draped in the usual sterile fashion. After the skin was anesthetized with 1% lidocaine, and under ultrasound guidance, the left IJ was cannulated and a guidewire introduced into the superior vena cava under fluoroscopic control. The tract over the wire was dilated and then the dilator and peel-away sheath were passed over the wire and the wire and dilator removed. The catheter was passed through the peel-away sheath and positioned in the right atrium. The exit site for the catheter was selected and the skin anesthetized between the 2 areas. The catheter was then brought through the tunnel, cut to the appropriate length, and the distal ports were attached. Both ports withdrew easily, were then flushed with heparinized saline and filled with concentrated heparin. The catheter was secured at its exit site with a 3-0 nylon suture. The IJ cannulation site was closed with a 4-0 subcuticular stitch. A sterile dressing was applied. The patient tolerated the procedure well and was transferred to the recovery room in stable condition. All needle and sponge counts were correct.  [name], MD, FACS [facility] [place]: 06/26/2015 DATE OF DICTATION: 06/26/2015

## 2015-06-26 NOTE — Progress Notes (Signed)
Utilization review completed.  

## 2015-06-26 NOTE — Progress Notes (Signed)
Subjective:  Hemodynamically stable- s/p HD yest- removed 3000- no UOP - s/p tunneled HD cath today- groggy after surgery   Objective Vital signs in last 24 hours: Filed Vitals:   06/26/15 1053 06/26/15 1054 06/26/15 1109 06/26/15 1114  BP:  158/64 136/70   Pulse:  99 93   Temp: 97.8 F (36.6 C)   98.1 F (36.7 C)  TempSrc:      Resp:  20 16   Height:      Weight:      SpO2:  95% 96%    Weight change: 0.3 kg (10.6 oz)  Intake/Output Summary (Last 24 hours) at 06/26/15 1155 Last data filed at 06/26/15 1044  Gross per 24 hour  Intake    300 ml  Output   3000 ml  Net  -2700 ml    Assessment/ Plan: Pt is a 65 y.o. yo male who was admitted on 06/09/2015 with need for AAA repair with complication of AKI Assessment/Plan: 1. A on CRF- crt of 1.27 on 2/2-  AKI in the setting of a chronically nonfunctional right kdney and hemodynamic changes associated with AAA repair and left renal artery bypass on 2/6.  required CRRT 2/9- 2/13- then IHD 2/15 , 2/17  2/20 and 2/22- had nearly 500 of urine with straight cath on Monday, then 400 Tuesday but none since- orders for bladder scan q shift and cath when needed- but numbers quite bad and worsening in between HD treatments s/p  tunneled HD cath    Plan for [facility] HD tomorrow but will check labs first.   Will start process to look for OP HD unit and may need to talk about AVF/AVG next week  2. HTN/vol- overloaded with hyponatremia- with UF  improves 3. Anemia- due to surgery and CKD- - started aranesp and transfuse as needed- also check iron stores  4. S./p AAA repair and renal artery bypass - per VVS 5. Bones- phos down with HD only- will check PTH  6. Hypokalemia- will do high K bath 7. GI issues- requiring TNA but now weaned off   [place]: Basic Metabolic Panel:  Recent Labs Lab 06/23/15 0250 06/24/15 0340 06/25/15 0435 06/26/15 0845  NA 122* 134* 128* 135  K 4.5 3.4* 3.8 3.8  CL 88* 96* 96* 99*  CO2 16* 24 19*  22  GLUCOSE 123* 121* 135* 107*  BUN 151* 80* 111* 60*  CREATININE 9.52* 6.22* 8.04* 5.91*  CALCIUM 8.6* 8.2* 8.5* 8.5*  PHOS 8.1* 4.4 5.8*  --    Liver Function Tests:  Recent Labs Lab 06/23/15 0250 06/24/15 0340 06/25/15 0435  AST 16  --   --   ALT 27  --   --   ALKPHOS 115  --   --   BILITOT 0.8  --   --   PROT 5.9*  --   --   ALBUMIN 1.8* 1.7* 1.7*    Recent Labs Lab 06/22/15 0350 06/23/15 0250 06/24/15 0340  LIPASE 130* 152* 122*  AMYLASE 113* 157* 145*   No results for input(s): AMMONIA in the last 168 hours. CBC:  Recent Labs Lab 06/22/15 0350 06/23/15 0250 06/24/15 0340 06/25/15 0435 06/26/15 0445  WBC 12.3* 11.8* 9.2 9.7 12.4*  NEUTROABS  --  9.5*  --   --   --   HGB 8.6* 8.0* 7.2* 8.9* 8.5*  HCT 25.5* 23.5* 21.5* 26.7* 25.8*  MCV 82.0 81.0 82.1 83.2 83.5  PLT 242 239 250 258  266   Cardiac Enzymes: No results for input(s): CKTOTAL, CKMB, CKMBINDEX, TROPONINI in the last 168 hours. CBG:  Recent Labs Lab 06/25/15 1933 06/26/15 0018 06/26/15 0447 06/26/15 0827 06/26/15 1127  GLUCAP 125* 87 95 98 97    Iron Studies: No results for input(s): IRON, TIBC, TRANSFERRIN, FERRITIN in the last 72 hours. Studies/Results: Dg Chest Port 1 View  06/26/2015  CLINICAL DATA:  Diatek insertion EXAM: PORTABLE CHEST 1 VIEW COMPARISON:  06/20/2015 FINDINGS: Right IJ and left IJ catheters, both tips at the SVC level. No apical pneumothorax or new mediastinal widening. Normal heart size. New diffuse interstitial coarsening with bronchial cuffing. IMPRESSION: 1. Unremarkable appearance of central catheters. 2. Pulmonary edema. Electronically Signed   By: Monte Fantasia M.D.   On: 06/26/2015 11:27   Medications: Infusions: . sodium chloride 20 mL/hr at 06/24/15 2300  . sodium chloride      Scheduled Medications: . alteplase  2 mg Intracatheter Once  . antiseptic oral rinse  7 mL Mouth Rinse q12n4p  . atorvastatin  20 mg Oral QHS  . chlorhexidine  15 mL Mouth  Rinse BID  . darbepoetin (ARANESP) injection - NON-DIALYSIS  150 mcg Subcutaneous Q Mon-1800  . enoxaparin (LOVENOX) injection  30 mg Subcutaneous Q24H  . insulin aspart  0-15 Units Subcutaneous 6 times per day  . metoprolol tartrate  25 mg Oral BID  . pantoprazole (PROTONIX) IV  40 mg Intravenous Q24H  . sodium chloride flush  10-40 mL Intracatheter Q12H    have reviewed scheduled and prn medications.  Physical Exam: General: alert- looks good Heart: RRR Lungs: CBS bilat Abdomen: soft, non tender Extremities: minimal edema Dialysis Access: new PC on left    06/26/2015,11:55 AM  LOS: 17 days

## 2015-06-26 NOTE — Interval H&P Note (Signed)
History and Physical Interval Note:  06/26/2015 9:29 AM  [name] [name]  has presented today for surgery, with the diagnosis of Acute renal insufficiency N28.9  The various methods of treatment have been discussed with the patient and family. After consideration of risks, benefits and other options for treatment, the patient has consented to  Procedure(s): INSERTION OF DIALYSIS CATHETER (N/A) as a surgical intervention .  The patient's history has been reviewed, patient examined, no change in status, stable for surgery.  I have reviewed the patient's chart and labs.  Questions were answered to the patient's satisfaction.     [name]

## 2015-06-26 NOTE — Progress Notes (Addendum)
Placed on tele to monitor. Chg bath complete

## 2015-06-26 NOTE — Transfer of Care (Signed)
Immediate Anesthesia Transfer of Care Note  Patient: [name] [name]  Procedure(s) Performed: Procedure(s): INSERTION OF DIALYSIS CATHETER (N/A)  Patient Location: PACU  Anesthesia Type:MAC  Level of Consciousness: awake, alert , oriented and patient cooperative  Airway & Oxygen Therapy: Patient Spontanous Breathing and Patient connected to nasal cannula oxygen  Post-op Assessment: Report given to RN and Post -op Vital signs reviewed and stable  Post vital signs: Reviewed and stable  Last Vitals:  Filed Vitals:   06/26/15 0449 06/26/15 1053  BP: 140/60   Pulse: 90   Temp: 37.1 C 36.6 C  Resp: 18     Complications: No apparent anesthesia complications

## 2015-06-26 NOTE — H&P (View-Only) (Signed)
[facility] Progress Note  Subjective  - POD #17   Is hungry. Had BM last night and this am. No other complaints.   Objective Filed Vitals:   06/25/15 2356 06/26/15 0449  BP: 141/68 140/60  Pulse: 99 90  Temp:  98.8 F (37.1 C)  Resp:  18   Tmax 99.5 BP 130s-170s Afib rate 70s 95% 2L  Intake/Output Summary (Last 24 hours) at 06/26/15 0748 Last data filed at 06/25/15 1700  Gross per 24 hour  Intake     10 ml  Output   3000 ml  Net  -2990 ml   Resting in NAD CV irregularly irregular Lungs CTAB Abdomen soft and non tender   Assessment/Planning: 65 y.o. male is s/p: repair of juxtarenal AAA, left renal bypass, pancreatitis, AKI 17 Days Post-Op   Tolerating regular diet well. TPN d/c'ed yesterday. AKI: 235 cc urine on bladder scan last night. For [place] placement today.  Mild leukocytosis: will trend WBC. Afib rate controlled. Po metoprolol restarted yesterday.  Mobilize.  Continue to wean 02.   Alvia Grove 06/26/2015 7:48 AM --  Laboratory CBC    Component Value Date/Time   WBC 12.4* 06/26/2015 0445   HGB 8.5* 06/26/2015 0445   HCT 25.8* 06/26/2015 0445   PLT 266 06/26/2015 0445    BMET    Component Value Date/Time   NA 128* 06/25/2015 0435   K 3.8 06/25/2015 0435   CL 96* 06/25/2015 0435   CO2 19* 06/25/2015 0435   GLUCOSE 135* 06/25/2015 0435   BUN 111* 06/25/2015 0435   CREATININE 8.04* 06/25/2015 0435   CREATININE 1.10 11/07/2014 0804   CALCIUM 8.5* 06/25/2015 0435   GFRNONAA 6* 06/25/2015 0435   GFRNONAA 53* 05/14/2013 0905   GFRAA 7* 06/25/2015 0435   GFRAA 62 05/14/2013 0905    COAG Lab Results  Component Value Date   INR 1.39 06/10/2015   INR 1.40 06/09/2015   INR 1.07 06/09/2015   No results found for: PTT  Antibiotics Anti-infectives    Start     Dose/Rate Route Frequency Ordered Stop   06/16/15 1300  cefTRIAXone (ROCEPHIN) 1 g in dextrose 5 % 50 mL IVPB     1 g 100 mL/hr over 30 Minutes Intravenous  Every 24 hours 06/16/15 1154 06/19/15 1358   06/14/15 1200  vancomycin (VANCOCIN) IVPB 1000 mg/200 mL premix  Status:  Discontinued     1,000 mg 200 mL/hr over 60 Minutes Intravenous Every 24 hours 06/13/15 1014 06/15/15 1101   06/13/15 1200  piperacillin-tazobactam (ZOSYN) IVPB 3.375 g  Status:  Discontinued     3.375 g 100 mL/hr over 30 Minutes Intravenous 4 times per day 06/13/15 1014 06/16/15 1154   06/13/15 1015  vancomycin (VANCOCIN) 1,500 mg in sodium chloride 0.9 % 250 mL IVPB     1,500 mg 250 mL/hr over 60 Minutes Intravenous  Once 06/13/15 1014 06/13/15 1143   06/09/15 2000  cefUROXime (ZINACEF) 1.5 g in dextrose 5 % 50 mL IVPB     1.5 g 100 mL/hr over 30 Minutes Intravenous Every 12 hours 06/09/15 1522 06/10/15 0855   06/09/15 1130  cefUROXime (ZINACEF) 1.5 g in dextrose 5 % 50 mL IVPB  Status:  Discontinued     1.5 g 100 mL/hr over 30 Minutes Intravenous To Surgery 06/09/15 1123 06/09/15 1509   06/09/15 0700  cefUROXime (ZINACEF) 1.5 g in dextrose 5 % 50 mL IVPB     1.5 g 100 mL/hr over 30  Minutes Intravenous To [facility] Surgical 06/08/15 1434 06/09/15 [place], [place] [facility] Office: [phone] Pager: [phone] 06/26/2015 7:48 AM

## 2015-06-26 NOTE — Progress Notes (Signed)
550 mL of urine was removed with in and out cath. Will continue to monitor.  Grant Fontana RN, BSN

## 2015-06-26 NOTE — Anesthesia Postprocedure Evaluation (Signed)
Anesthesia Post Note  Patient: [name] [name]  Procedure(s) Performed: Procedure(s) (LRB): INSERTION OF DIALYSIS CATHETER (Left)  Patient location during evaluation: PACU Anesthesia Type: MAC Level of consciousness: awake and alert Pain management: pain level controlled Vital Signs Assessment: post-procedure vital signs reviewed and stable Respiratory status: spontaneous breathing, nonlabored ventilation and respiratory function stable Cardiovascular status: stable and blood pressure returned to baseline Anesthetic complications: no    Last Vitals:  Filed Vitals:   06/26/15 1109 06/26/15 1114  BP: 136/70   Pulse: 93   Temp:  36.7 C  Resp: 16     Last Pain:  Filed Vitals:   06/26/15 1115  PainSc: Asleep                 Saman Umstead,W. EDMOND

## 2015-06-26 NOTE — Progress Notes (Addendum)
[facility] Progress Note  Subjective  - POD #17   Is hungry. Had BM last night and this am. No other complaints.   Objective Filed Vitals:   06/25/15 2356 06/26/15 0449  BP: 141/68 140/60  Pulse: 99 90  Temp:  98.8 F (37.1 C)  Resp:  18   Tmax 99.5 BP 130s-170s Afib rate 70s 95% 2L  Intake/Output Summary (Last 24 hours) at 06/26/15 0748 Last data filed at 06/25/15 1700  Gross per 24 hour  Intake     10 ml  Output   3000 ml  Net  -2990 ml   Resting in NAD CV irregularly irregular Lungs CTAB Abdomen soft and non tender   Assessment/Planning: 65 y.o. male is s/p: repair of juxtarenal AAA, left renal bypass, pancreatitis, AKI 17 Days Post-Op   Tolerating regular diet well. TPN d/c'ed yesterday. AKI: 235 cc urine on bladder scan last night. For [facility] placement today.  Mild leukocytosis: will trend WBC. Afib rate controlled. Po metoprolol restarted yesterday.  Mobilize.  Continue to wean 02.   Alvia Grove 06/26/2015 7:48 AM --  Laboratory CBC    Component Value Date/Time   WBC 12.4* 06/26/2015 0445   HGB 8.5* 06/26/2015 0445   HCT 25.8* 06/26/2015 0445   PLT 266 06/26/2015 0445    BMET    Component Value Date/Time   NA 128* 06/25/2015 0435   K 3.8 06/25/2015 0435   CL 96* 06/25/2015 0435   CO2 19* 06/25/2015 0435   GLUCOSE 135* 06/25/2015 0435   BUN 111* 06/25/2015 0435   CREATININE 8.04* 06/25/2015 0435   CREATININE 1.10 11/07/2014 0804   CALCIUM 8.5* 06/25/2015 0435   GFRNONAA 6* 06/25/2015 0435   GFRNONAA 53* 05/14/2013 0905   GFRAA 7* 06/25/2015 0435   GFRAA 62 05/14/2013 0905    COAG Lab Results  Component Value Date   INR 1.39 06/10/2015   INR 1.40 06/09/2015   INR 1.07 06/09/2015   No results found for: PTT  Antibiotics Anti-infectives    Start     Dose/Rate Route Frequency Ordered Stop   06/16/15 1300  cefTRIAXone (ROCEPHIN) 1 g in dextrose 5 % 50 mL IVPB     1 g 100 mL/hr over 30 Minutes Intravenous  Every 24 hours 06/16/15 1154 06/19/15 1358   06/14/15 1200  vancomycin (VANCOCIN) IVPB 1000 mg/200 mL premix  Status:  Discontinued     1,000 mg 200 mL/hr over 60 Minutes Intravenous Every 24 hours 06/13/15 1014 06/15/15 1101   06/13/15 1200  piperacillin-tazobactam (ZOSYN) IVPB 3.375 g  Status:  Discontinued     3.375 g 100 mL/hr over 30 Minutes Intravenous 4 times per day 06/13/15 1014 06/16/15 1154   06/13/15 1015  vancomycin (VANCOCIN) 1,500 mg in sodium chloride 0.9 % 250 mL IVPB     1,500 mg 250 mL/hr over 60 Minutes Intravenous  Once 06/13/15 1014 06/13/15 1143   06/09/15 2000  cefUROXime (ZINACEF) 1.5 g in dextrose 5 % 50 mL IVPB     1.5 g 100 mL/hr over 30 Minutes Intravenous Every 12 hours 06/09/15 1522 06/10/15 0855   06/09/15 1130  cefUROXime (ZINACEF) 1.5 g in dextrose 5 % 50 mL IVPB  Status:  Discontinued     1.5 g 100 mL/hr over 30 Minutes Intravenous To Surgery 06/09/15 1123 06/09/15 1509   06/09/15 0700  cefUROXime (ZINACEF) 1.5 g in dextrose 5 % 50 mL IVPB     1.5 g 100 mL/hr over 30  Minutes Intravenous To [facility] Surgical 06/08/15 1434 06/09/15 [place], [place] [facility] Office: [phone] Pager: [phone] 06/26/2015 7:48 AM

## 2015-06-26 NOTE — Progress Notes (Signed)
Bladder scan revealed 538 mL of urine in bladder. In and out cath will be performed per MD order.   Grant Fontana RN, BSN

## 2015-06-27 ENCOUNTER — Encounter (HOSPITAL_COMMUNITY): Payer: Self-pay | Admitting: Vascular Surgery

## 2015-06-27 LAB — RENAL FUNCTION PANEL
Albumin: 1.6 g/dL — ABNORMAL LOW (ref 3.5–5.0)
Anion gap: 15 (ref 5–15)
BUN: 80 mg/dL — ABNORMAL HIGH (ref 6–20)
CO2: 21 mmol/L — ABNORMAL LOW (ref 22–32)
Calcium: 8.9 mg/dL (ref 8.9–10.3)
Chloride: 98 mmol/L — ABNORMAL LOW (ref 101–111)
Creatinine, Ser: 7.35 mg/dL — ABNORMAL HIGH (ref 0.61–1.24)
GFR calc Af Amer: 8 mL/min — ABNORMAL LOW (ref >60)
GFR calc non Af Amer: 7 mL/min — ABNORMAL LOW (ref >60)
Glucose, Bld: 111 mg/dL — ABNORMAL HIGH (ref 65–99)
Phosphorus: 5.2 mg/dL — ABNORMAL HIGH (ref 2.5–4.6)
Potassium: 3.9 mmol/L (ref 3.5–5.1)
Sodium: 134 mmol/L — ABNORMAL LOW (ref 135–145)

## 2015-06-27 LAB — IRON AND TIBC
Iron: 18 ug/dL — ABNORMAL LOW (ref 45–182)
Saturation Ratios: 11 % — ABNORMAL LOW (ref 17.9–39.5)
TIBC: 167 ug/dL — ABNORMAL LOW (ref 250–450)
UIBC: 149 ug/dL

## 2015-06-27 LAB — FERRITIN: Ferritin: 562 ng/mL — ABNORMAL HIGH (ref 24–336)

## 2015-06-27 LAB — GLUCOSE, CAPILLARY
Glucose-Capillary: 129 mg/dL — ABNORMAL HIGH (ref 65–99)
Glucose-Capillary: 139 mg/dL — ABNORMAL HIGH (ref 65–99)
Glucose-Capillary: 173 mg/dL — ABNORMAL HIGH (ref 65–99)
Glucose-Capillary: 123 mg/dL — ABNORMAL HIGH (ref 65–99)
Glucose-Capillary: 153 mg/dL — ABNORMAL HIGH (ref 65–99)
Glucose-Capillary: 123 mg/dL — ABNORMAL HIGH (ref 65–99)

## 2015-06-27 LAB — CBC
HCT: 26.3 % — ABNORMAL LOW (ref 39.0–52.0)
Hemoglobin: 9 g/dL — ABNORMAL LOW (ref 13.0–17.0)
MCH: 29.5 pg (ref 26.0–34.0)
MCHC: 34.2 g/dL (ref 30.0–36.0)
MCV: 86.2 fL (ref 78.0–100.0)
Platelets: 270 10*3/uL (ref 150–400)
RBC: 3.05 MIL/uL — ABNORMAL LOW (ref 4.22–5.81)
RDW: 16.3 % — ABNORMAL HIGH (ref 11.5–15.5)
WBC: 10 10*3/uL (ref 4.0–10.5)

## 2015-06-27 LAB — MAGNESIUM: Magnesium: 2 mg/dL (ref 1.7–2.4)

## 2015-06-27 MED ORDER — WARFARIN - PHARMACIST DOSING INPATIENT
Freq: Every day | Status: DC
  Administered 2015-06-30 – 2015-07-02 (×2)

## 2015-06-27 MED ORDER — WARFARIN SODIUM 7.5 MG PO TABS
7.5000 mg | ORAL_TABLET | Freq: Once | ORAL | Status: AC
  Administered 2015-06-27: 7.5 mg via ORAL
  Filled 2015-06-27: qty 1

## 2015-06-27 MED ORDER — SODIUM CHLORIDE 0.9 % IV SOLN
125.0000 mg | Freq: Every day | INTRAVENOUS | Status: AC
  Administered 2015-06-27 – 2015-06-29 (×3): 125 mg via INTRAVENOUS
  Filled 2015-06-27 (×5): qty 10

## 2015-06-27 MED ORDER — PANTOPRAZOLE SODIUM 40 MG PO TBEC
40.0000 mg | DELAYED_RELEASE_TABLET | Freq: Every day | ORAL | Status: DC
  Administered 2015-06-28 – 2015-06-30 (×3): 40 mg via ORAL
  Filled 2015-06-27 (×3): qty 1

## 2015-06-27 MED ORDER — TAMSULOSIN HCL 0.4 MG PO CAPS
0.4000 mg | ORAL_CAPSULE | Freq: Every day | ORAL | Status: DC
  Administered 2015-06-27 – 2015-07-06 (×10): 0.4 mg via ORAL
  Filled 2015-06-27 (×10): qty 1

## 2015-06-27 NOTE — Procedures (Signed)
Patient was seen on dialysis and the procedure was supervised.  BFR 350  Via PC BP is  175/84.   Patient appears to be tolerating treatment well  Arshiya Jakes A 06/27/2015

## 2015-06-27 NOTE — Progress Notes (Signed)
ANTICOAGULATION CONSULT NOTE - Initial Consult  Pharmacy Consult for warfarin Indication: atrial fibrillation and stroke  No Known Allergies  Patient Measurements: Height: 6' (182.9 cm) Weight: 202 lb 6.1 oz (91.8 kg) IBW/kg (Calculated) : 77.6  Vital Signs: Temp: 97.4 F (36.3 C) (02/24 1341) Temp Source: Oral (02/24 1341) BP: 175/84 mmHg (02/24 1456) Pulse Rate: 100 (02/24 1456)  Labs:  Recent Labs  06/25/15 0435 06/26/15 0445 06/26/15 0845 06/27/15 0325 06/27/15 0330  HGB 8.9* 8.5*  --  9.0*  --   HCT 26.7* 25.8*  --  26.3*  --   PLT 258 266  --  270  --   CREATININE 8.04*  --  5.91*  --  7.35*    Estimated Creatinine Clearance: 11.4 mL/min (by C-G formula based on Cr of 7.35).   Medical History: Past Medical History  Diagnosis Date  . Arteriosclerotic cardiovascular disease (ASCVD)     AMI in 2000 [facility] at [facility]; cath in 12/2006->  Chronic total obstruction of the RCA;  drug-eluting stent placed in M1; inferior hypokinesis with an EF of 45%  . Tobacco abuse, in remission     20 pack years; quit in 2009  . Hypertension   . Chronic anticoagulation   . Permanent atrial fibrillation (HCC)     on coumadin for couple of years, stopped plavix at that time  . PVD (peripheral vascular disease) ([place])     Ct angiogram in 2009 revealed stable disease with 80% celiac stenosis,50% right renal artery ,ASCVD with ulceration in the abdominal aortashe  . Nephrolithiasis   . Cerebrovascular disease 2002    carotid stent  . Hyperlipidemia   . Myocardial infarction ([place]) 10 yrs ago  . Testicular carcinoma ([place]) 1990    right orchiectomy  . AAA (abdominal aortic aneurysm) ([place]) 2017    3.3cm  . GERD (gastroesophageal reflux disease)   . Chronic combined systolic and diastolic CHF (congestive heart failure) [facility])     Assessment: 65 y/o male s/p elective repair of AAA and left renal artery bypass on 06/09/15 also with AKI on I-HD currently. Pharmacy consulted to resume  warfarin for hx Afib and stroke. No bleeding noted, Hb low stable, platelets are normal.  PTA regimen: 5 mg daily except 7.5 mg Wed/Thurs  Goal of Therapy:  INR 2-3 Monitor platelets by anticoagulation protocol: Yes   Plan:  - Warfarin 7.5 mg PO tonight - Daily INR - D/c Lovenox when INR >2 - Monitor for s/sx of bleeding  [facility], Pharm.D., BCPS Clinical Pharmacist Pager: [phone] 06/27/2015 3:42 PM

## 2015-06-27 NOTE — Progress Notes (Signed)
Rehab admissions - Patient is doing well with therapies ambulating minguard assist 320 feet RW.  Likely will be able to discharge directly home and go to outpatient HD once clipping is completed.  Call for questions.  RC:[phone]

## 2015-06-27 NOTE — Progress Notes (Signed)
Bladder scan revealed 375 mL of urine. In and out cath was performed and 0 mL was emptied. Next shift is aware and will follow-up with this.   Grant Fontana RN, BSN

## 2015-06-27 NOTE — Progress Notes (Signed)
[facility] Progress Note  Subjective  - POD #18  No complaints today. Had 4 BMs, said they were loose but not watery.   Objective Filed Vitals:   06/27/15 0016 06/27/15 0134  BP: 155/61 134/70  Pulse: 94 92  Temp:  98.3 F (36.8 C)  Resp:  18   Tmax 98.3 BP 130s-150s Afib rate 90s 02 94% 2L  Intake/Output Summary (Last 24 hours) at 06/27/15 0731 Last data filed at 06/26/15 1823  Gross per 24 hour  Intake   1180 ml  Output    550 ml  Net    630 ml   Irregularly irregular rhythm Clear breath sounds Abdomen soft and non tender Minimal edema lower extremities  Assessment/Planning: 65 y.o. male is s/p: repair of juxtarenal AAA, left renal bypass, pancreatitis, AKI 1 Day Post-Op   AKI: 550 cc urine via straight cath yesterday. S/p [facility] placement yesterday. For HD today.  Afib: rate 90s. Will ask Dr. Bridgett Larsson about resuming coumadin today.  Anemia: Hgb stable at 9. No signs or symptoms of bleeding.  Tolerating renal diet well.  Mobilize.   Alvia Grove 06/27/2015 7:31 AM --  Laboratory CBC    Component Value Date/Time   WBC 10.0 06/27/2015 0325   HGB 9.0* 06/27/2015 0325   HCT 26.3* 06/27/2015 0325   PLT 270 06/27/2015 0325    BMET    Component Value Date/Time   NA 134* 06/27/2015 0330   K 3.9 06/27/2015 0330   CL 98* 06/27/2015 0330   CO2 21* 06/27/2015 0330   GLUCOSE 111* 06/27/2015 0330   BUN 80* 06/27/2015 0330   CREATININE 7.35* 06/27/2015 0330   CREATININE 1.10 11/07/2014 0804   CALCIUM 8.9 06/27/2015 0330   GFRNONAA 7* 06/27/2015 0330   GFRNONAA 53* 05/14/2013 0905   GFRAA 8* 06/27/2015 0330   GFRAA 62 05/14/2013 0905    COAG Lab Results  Component Value Date   INR 1.39 06/10/2015   INR 1.40 06/09/2015   INR 1.07 06/09/2015   No results found for: PTT  Antibiotics Anti-infectives    Start     Dose/Rate Route Frequency Ordered Stop   06/26/15 0945  cefUROXime (ZINACEF) 1.5 g in dextrose 5 % 50 mL IVPB     1.5  g 100 mL/hr over 30 Minutes Intravenous To [facility] 06/26/15 0931 06/26/15 1015   06/16/15 1300  cefTRIAXone (ROCEPHIN) 1 g in dextrose 5 % 50 mL IVPB     1 g 100 mL/hr over 30 Minutes Intravenous Every 24 hours 06/16/15 1154 06/19/15 1358   06/14/15 1200  vancomycin (VANCOCIN) IVPB 1000 mg/200 mL premix  Status:  Discontinued     1,000 mg 200 mL/hr over 60 Minutes Intravenous Every 24 hours 06/13/15 1014 06/15/15 1101   06/13/15 1200  piperacillin-tazobactam (ZOSYN) IVPB 3.375 g  Status:  Discontinued     3.375 g 100 mL/hr over 30 Minutes Intravenous 4 times per day 06/13/15 1014 06/16/15 1154   06/13/15 1015  vancomycin (VANCOCIN) 1,500 mg in sodium chloride 0.9 % 250 mL IVPB     1,500 mg 250 mL/hr over 60 Minutes Intravenous  Once 06/13/15 1014 06/13/15 1143   06/09/15 2000  cefUROXime (ZINACEF) 1.5 g in dextrose 5 % 50 mL IVPB     1.5 g 100 mL/hr over 30 Minutes Intravenous Every 12 hours 06/09/15 1522 06/10/15 0855   06/09/15 1130  cefUROXime (ZINACEF) 1.5 g in dextrose 5 % 50 mL IVPB  Status:  Discontinued  1.5 g 100 mL/hr over 30 Minutes Intravenous To Surgery 06/09/15 1123 06/09/15 1509   06/09/15 0700  cefUROXime (ZINACEF) 1.5 g in dextrose 5 % 50 mL IVPB     1.5 g 100 mL/hr over 30 Minutes Intravenous To [facility] 06/08/15 1434 06/09/15 [place], PA-C [facility] Office: [phone] Pager: [phone] 06/27/2015 7:31 AM

## 2015-06-27 NOTE — Progress Notes (Signed)
Subjective:  Hemodynamically stable- had 550 of urine but did not pass spontaneously - labs worsening quite a bit off HD so will need another HD treatment today   Objective Vital signs in last 24 hours: Filed Vitals:   06/26/15 2112 06/27/15 0016 06/27/15 0134 06/27/15 0500  BP: 149/78 155/61 134/70   Pulse: 97 94 92   Temp: 98.3 F (36.8 C)  98.3 F (36.8 C)   TempSrc: Oral  Oral   Resp: 19  18   Height:      Weight:    90.81 kg (200 lb 3.2 oz)  SpO2: 96%  94%    Weight change: -7.29 kg (-16 lb 1.1 oz)  Intake/Output Summary (Last 24 hours) at 06/27/15 1056 Last data filed at 06/27/15 1017  Gross per 24 hour  Intake   1360 ml  Output    550 ml  Net    810 ml    Assessment/ Plan: Pt is a 65 y.o. yo male who was admitted on 06/09/2015 with need for AAA repair with complication of AKI Assessment/Plan: 1. A on CRF- crt of 1.27 on 2/2-  AKI in the setting of a chronically nonfunctional right kdney and hemodynamic changes associated with AAA repair and left renal artery bypass on 2/6.  required CRRT 2/9- 2/13- then IHD 2/15 , 2/17  2/20 and 2/22- had nearly 500 of urine with straight cath on Monday, then 400 Tuesday and 4 yest - orders for bladder scan q shift and cath when needed- but numbers quite bad and worsening in between HD treatments s/p  tunneled HD cath    Plan for lkely HD today.   Will start process to look for OP HD unit and may need to talk about AVF/AVG next week if no further signs of recover- will start flomax to see if he is able to pass urine spontaneously   2. HTN/vol- overloaded with hyponatremia- with UF  improves 3. Anemia- due to surgery and CKD- - started aranesp and transfuse as needed- need to replete iron as well 4. S./p AAA repair and renal artery bypass - per VVS 5. Bones- phos down with HD only- will check PTH - pending  6. Hypokalemia- will do high K bath 7. GI issues- requiring TNA but now weaned off   [place]: Basic  Metabolic Panel:  Recent Labs Lab 06/24/15 0340 06/25/15 0435 06/26/15 0845 06/27/15 0330  NA 134* 128* 135 134*  K 3.4* 3.8 3.8 3.9  CL 96* 96* 99* 98*  CO2 24 19* 22 21*  GLUCOSE 121* 135* 107* 111*  BUN 80* 111* 60* 80*  CREATININE 6.22* 8.04* 5.91* 7.35*  CALCIUM 8.2* 8.5* 8.5* 8.9  PHOS 4.4 5.8*  --  5.2*   Liver Function Tests:  Recent Labs Lab 06/23/15 0250 06/24/15 0340 06/25/15 0435 06/27/15 0330  AST 16  --   --   --   ALT 27  --   --   --   ALKPHOS 115  --   --   --   BILITOT 0.8  --   --   --   PROT 5.9*  --   --   --   ALBUMIN 1.8* 1.7* 1.7* 1.6*    Recent Labs Lab 06/22/15 0350 06/23/15 0250 06/24/15 0340  LIPASE 130* 152* 122*  AMYLASE 113* 157* 145*   No results for input(s): AMMONIA in the last 168 hours. CBC:  Recent Labs Lab 06/23/15 0250 06/24/15 0340 06/25/15  [id number] 06/26/15 0445 06/27/15 0325  WBC 11.8* 9.2 9.7 12.4* 10.0  NEUTROABS 9.5*  --   --   --   --   HGB 8.0* 7.2* 8.9* 8.5* 9.0*  HCT 23.5* 21.5* 26.7* 25.8* 26.3*  MCV 81.0 82.1 83.2 83.5 86.2  PLT 239 250 258 266 270   Cardiac Enzymes: No results for input(s): CKTOTAL, CKMB, CKMBINDEX, TROPONINI in the last 168 hours. CBG:  Recent Labs Lab 06/26/15 1653 06/26/15 2110 06/27/15 0021 06/27/15 0527 06/27/15 0832  GLUCAP 104* 113* 139* 129* 173*    Iron Studies:   Recent Labs  06/27/15 0325  IRON 18*  TIBC 167*  FERRITIN 562*   Studies/Results: Dg Chest Port 1 View  06/26/2015  CLINICAL DATA:  Diatek insertion EXAM: PORTABLE CHEST 1 VIEW COMPARISON:  06/20/2015 FINDINGS: Right IJ and left IJ catheters, both tips at the SVC level. No apical pneumothorax or new mediastinal widening. Normal heart size. New diffuse interstitial coarsening with bronchial cuffing. IMPRESSION: 1. Unremarkable appearance of central catheters. 2. Pulmonary edema. Electronically Signed   By: Monte Fantasia M.D.   On: 06/26/2015 11:27   Medications: Infusions: . sodium chloride 20  mL/hr at 06/24/15 2300  . sodium chloride      Scheduled Medications: . alteplase  2 mg Intracatheter Once  . antiseptic oral rinse  7 mL Mouth Rinse q12n4p  . atorvastatin  20 mg Oral QHS  . chlorhexidine  15 mL Mouth Rinse BID  . darbepoetin (ARANESP) injection - NON-DIALYSIS  150 mcg Subcutaneous Q Mon-1800  . enoxaparin (LOVENOX) injection  30 mg Subcutaneous Q24H  . insulin aspart  0-15 Units Subcutaneous 6 times per day  . metoprolol tartrate  25 mg Oral BID  . pantoprazole (PROTONIX) IV  40 mg Intravenous Q24H  . sodium chloride flush  10-40 mL Intracatheter Q12H    have reviewed scheduled and prn medications.  Physical Exam: General: alert- looks good Heart: RRR Lungs: CBS bilat Abdomen: soft, non tender Extremities: minimal edema Dialysis Access: new PC on left    06/27/2015,10:56 AM  LOS: 18 days

## 2015-06-27 NOTE — Care Management Important Message (Signed)
Important Message  Patient Details  Name: [name] [name] MRN: [id number] Date of Birth: 04/27/1951   Medicare Important Message Given:  Yes    Nathen May 06/27/2015, 1:04 PM

## 2015-06-28 LAB — CBC
HCT: 28 % — ABNORMAL LOW (ref 39.0–52.0)
Hemoglobin: 9 g/dL — ABNORMAL LOW (ref 13.0–17.0)
MCH: 27.3 pg (ref 26.0–34.0)
MCHC: 32.1 g/dL (ref 30.0–36.0)
MCV: 84.8 fL (ref 78.0–100.0)
Platelets: 264 10*3/uL (ref 150–400)
RBC: 3.3 MIL/uL — ABNORMAL LOW (ref 4.22–5.81)
RDW: 16 % — ABNORMAL HIGH (ref 11.5–15.5)
WBC: 8.7 10*3/uL (ref 4.0–10.5)

## 2015-06-28 LAB — RENAL FUNCTION PANEL
Albumin: 1.5 g/dL — ABNORMAL LOW (ref 3.5–5.0)
Anion gap: 14 (ref 5–15)
BUN: 45 mg/dL — ABNORMAL HIGH (ref 6–20)
CO2: 23 mmol/L (ref 22–32)
Calcium: 8.4 mg/dL — ABNORMAL LOW (ref 8.9–10.3)
Chloride: 99 mmol/L — ABNORMAL LOW (ref 101–111)
Creatinine, Ser: 5.25 mg/dL — ABNORMAL HIGH (ref 0.61–1.24)
GFR calc Af Amer: 12 mL/min — ABNORMAL LOW (ref >60)
GFR calc non Af Amer: 11 mL/min — ABNORMAL LOW (ref >60)
Glucose, Bld: 98 mg/dL (ref 65–99)
Phosphorus: 3.7 mg/dL (ref 2.5–4.6)
Potassium: 3.9 mmol/L (ref 3.5–5.1)
Sodium: 136 mmol/L (ref 135–145)

## 2015-06-28 LAB — GLUCOSE, CAPILLARY
Glucose-Capillary: 119 mg/dL — ABNORMAL HIGH (ref 65–99)
Glucose-Capillary: 134 mg/dL — ABNORMAL HIGH (ref 65–99)
Glucose-Capillary: 151 mg/dL — ABNORMAL HIGH (ref 65–99)
Glucose-Capillary: 75 mg/dL (ref 65–99)
Glucose-Capillary: 94 mg/dL (ref 65–99)
Glucose-Capillary: 96 mg/dL (ref 65–99)

## 2015-06-28 LAB — PROTIME-INR
INR: 1.41 (ref 0.00–1.49)
Prothrombin Time: 17.4 seconds — ABNORMAL HIGH (ref 11.6–15.2)

## 2015-06-28 LAB — MAGNESIUM: Magnesium: 1.7 mg/dL (ref 1.7–2.4)

## 2015-06-28 LAB — PARATHYROID HORMONE, INTACT (NO CA): PTH: 92 pg/mL — ABNORMAL HIGH (ref 15–65)

## 2015-06-28 MED ORDER — WARFARIN SODIUM 7.5 MG PO TABS
7.5000 mg | ORAL_TABLET | Freq: Once | ORAL | Status: AC
Start: 2015-06-28 — End: 2015-06-28
  Administered 2015-06-28: 7.5 mg via ORAL
  Filled 2015-06-28: qty 1

## 2015-06-28 NOTE — Progress Notes (Addendum)
[facility] Progress Note  Subjective  - POD #19  No complaints today. Feels urge to void.   Objective Filed Vitals:   06/27/15 2012 06/28/15 0408  BP: 154/85 169/80  Pulse: 93 122  Temp: 97.8 F (36.6 C) 98.4 F (36.9 C)  Resp: 20 18    Intake/Output Summary (Last 24 hours) at 06/28/15 [id number] Last data filed at 06/28/15 0000  Gross per 24 hour  Intake    480 ml  Output   3616 ml  Net  -3136 ml   Heart irregularly irregular Lungs clear Abdomen soft and nontender Mild swelling lower extremities  Assessment/Planning: 65 y.o. male is s/p: repair of juxtarenal AAA, left renal bypass, pancreatitis, AKI 2 Days Post-Op   AKI: 600 of urine last shift, foley to be placed to avoid repeated straight caths. May need permanent dialysis access next week.  Afib: rate was 122 at 0700, currently 90s to low 100s. On lopressor 25 mg bid. Coumadin restarted yesterday.  Anemia stable. Continue to mobilize.   Alvia Grove 06/28/2015 9:07 AM --  Laboratory CBC    Component Value Date/Time   WBC 8.7 06/28/2015 0435   HGB 9.0* 06/28/2015 0435   HCT 28.0* 06/28/2015 0435   PLT 264 06/28/2015 0435    BMET    Component Value Date/Time   NA 136 06/28/2015 0435   K 3.9 06/28/2015 0435   CL 99* 06/28/2015 0435   CO2 23 06/28/2015 0435   GLUCOSE 98 06/28/2015 0435   BUN 45* 06/28/2015 0435   CREATININE 5.25* 06/28/2015 0435   CREATININE 1.10 11/07/2014 0804   CALCIUM 8.4* 06/28/2015 0435   GFRNONAA 11* 06/28/2015 0435   GFRNONAA 53* 05/14/2013 0905   GFRAA 12* 06/28/2015 0435   GFRAA 62 05/14/2013 0905    COAG Lab Results  Component Value Date   INR 1.41 06/28/2015   INR 1.39 06/10/2015   INR 1.40 06/09/2015   No results found for: PTT  Antibiotics Anti-infectives    Start     Dose/Rate Route Frequency Ordered Stop   06/26/15 0945  cefUROXime (ZINACEF) 1.5 g in dextrose 5 % 50 mL IVPB     1.5 g 100 mL/hr over 30 Minutes Intravenous To  [facility] 06/26/15 0931 06/26/15 1015   06/16/15 1300  cefTRIAXone (ROCEPHIN) 1 g in dextrose 5 % 50 mL IVPB     1 g 100 mL/hr over 30 Minutes Intravenous Every 24 hours 06/16/15 1154 06/19/15 1358   06/14/15 1200  vancomycin (VANCOCIN) IVPB 1000 mg/200 mL premix  Status:  Discontinued     1,000 mg 200 mL/hr over 60 Minutes Intravenous Every 24 hours 06/13/15 1014 06/15/15 1101   06/13/15 1200  piperacillin-tazobactam (ZOSYN) IVPB 3.375 g  Status:  Discontinued     3.375 g 100 mL/hr over 30 Minutes Intravenous 4 times per day 06/13/15 1014 06/16/15 1154   06/13/15 1015  vancomycin (VANCOCIN) 1,500 mg in sodium chloride 0.9 % 250 mL IVPB     1,500 mg 250 mL/hr over 60 Minutes Intravenous  Once 06/13/15 1014 06/13/15 1143   06/09/15 2000  cefUROXime (ZINACEF) 1.5 g in dextrose 5 % 50 mL IVPB     1.5 g 100 mL/hr over 30 Minutes Intravenous Every 12 hours 06/09/15 1522 06/10/15 0855   06/09/15 1130  cefUROXime (ZINACEF) 1.5 g in dextrose 5 % 50 mL IVPB  Status:  Discontinued     1.5 g 100 mL/hr over 30 Minutes Intravenous To Surgery 06/09/15  1123 06/09/15 1509   06/09/15 0700  cefUROXime (ZINACEF) 1.5 g in dextrose 5 % 50 mL IVPB     1.5 g 100 mL/hr over 30 Minutes Intravenous To [facility] 06/08/15 1434 06/09/15 1200       Virgina Jock, PA-C [facility] Office: [phone] Pager: [phone] 06/28/2015 9:07 AM     I have examined the patient, reviewed and agree with above.  Curt Jews, MD 06/28/2015 10:53 AM

## 2015-06-28 NOTE — Progress Notes (Signed)
ANTICOAGULATION CONSULT NOTE - Initial Consult  Pharmacy Consult for warfarin Indication: atrial fibrillation and stroke  No Known Allergies  Patient Measurements: Height: 6' (182.9 cm) Weight: 202 lb 6.1 oz (91.8 kg) IBW/kg (Calculated) : 77.6  Vital Signs: Temp: 98.4 F (36.9 C) (02/25 0408) Temp Source: Oral (02/25 0408) BP: 169/80 mmHg (02/25 0408) Pulse Rate: 122 (02/25 0408)  Labs:  Recent Labs  06/26/15 0445 06/26/15 0845 06/27/15 0325 06/27/15 0330 06/28/15 0435  HGB 8.5*  --  9.0*  --  9.0*  HCT 25.8*  --  26.3*  --  28.0*  PLT 266  --  270  --  264  LABPROT  --   --   --   --  17.4*  INR  --   --   --   --  1.41  CREATININE  --  5.91*  --  7.35* 5.25*    Estimated Creatinine Clearance: 16 mL/min (by C-G formula based on Cr of 5.25).   Medical History: Past Medical History  Diagnosis Date  . Arteriosclerotic cardiovascular disease (ASCVD)     AMI in 2000 treated at [facility]; cath in 12/2006->  Chronic total obstruction of the RCA;  drug-eluting stent placed in M1; inferior hypokinesis with an EF of 45%  . Tobacco abuse, in remission     20 pack years; quit in 2009  . Hypertension   . Chronic anticoagulation   . Permanent atrial fibrillation (HCC)     on coumadin for couple of years, stopped plavix at that time  . PVD (peripheral vascular disease) ([place])     Ct angiogram in 2009 revealed stable disease with 80% celiac stenosis,50% right renal artery ,ASCVD with ulceration in the abdominal aortashe  . Nephrolithiasis   . Cerebrovascular disease 2002    carotid stent  . Hyperlipidemia   . Myocardial infarction ([place]) 10 yrs ago  . Testicular carcinoma ([place]) 1990    right orchiectomy  . AAA (abdominal aortic aneurysm) ([place]) 2017    3.3cm  . GERD (gastroesophageal reflux disease)   . Chronic combined systolic and diastolic CHF (congestive heart failure) [facility])     Assessment: 65 y/o male s/p elective repair of AAA and left renal artery bypass on 06/09/15  also with AKI on I-HD currently. Pharmacy consulted to resume warfarin for hx Afib and stroke 2/24 after tunneled HD cath placed. No bleeding noted, Hb low stable, platelets are normal. INR 1.41 this AM.  PTA regimen: 5 mg daily except 7.5 mg Wed/Thurs  Goal of Therapy:  INR 2-3 Monitor platelets by anticoagulation protocol: Yes   Plan:  - Warfarin 7.5 mg PO tonight - Daily INR - D/c Lovenox when INR >2 - Monitor for s/sx of bleeding  Elicia Lamp, PharmD, BCPS Clinical Pharmacist Pager [phone] 06/28/2015 12:30 PM

## 2015-06-28 NOTE — Progress Notes (Signed)
Subjective:  HD yest- removed 3000- tolerated well- BP if anything high-  had 600 of urine recorded (needed to be cathed) and apparently had urine in bladder this AM but in and out cath did not return any    Objective Vital signs in last 24 hours: Filed Vitals:   06/27/15 1730 06/27/15 1748 06/27/15 2012 06/28/15 0408  BP: 169/91 147/85 154/85 169/80  Pulse: 112 108 93 122  Temp:  97.4 F (36.3 C) 97.8 F (36.6 C) 98.4 F (36.9 C)  TempSrc:  Oral Oral Oral  Resp: 22 24 20 18   Height:      Weight:      SpO2:   99% 96%   Weight change: 0.99 kg (2 lb 2.9 oz)  Intake/Output Summary (Last 24 hours) at 06/28/15 0755 Last data filed at 06/28/15 0000  Gross per 24 hour  Intake    600 ml  Output   3616 ml  Net  -3016 ml    Assessment/ Plan: Pt is [name] 65 y.o. yo male who was admitted on 06/09/2015 with need for AAA repair with complication of AKI Assessment/Plan: 1. [name] on CRF- crt of 1.27 on 2/2-  AKI in the setting of [name] chronically nonfunctional right kdney and hemodynamic changes associated with AAA repair and left renal artery bypass on 2/6.  required CRRT 2/9- 2/13- then IHD 2/15 , 2/17  2/20 , 2/22 and 2/24- averaging about 500-600 of urine q day but has not been able to pass for at least 4 days- pt agrees to replace foley so we can keep track but also to spare him the repeated in and out caths.  As of now, numbers worsening in between HD treatments s/p  tunneled HD cath.  Will start process to look for OP HD unit and may need to talk about AVF/AVG next week if no further signs of recovery- follow labs and UOP over weekend 2. HTN/vol- overloaded with hyponatremia- with UF  improves 3. Anemia- due to surgery and CKD- - started aranesp and transfuse as needed- need to replete iron as well 4. S./p AAA repair and renal artery bypass - per VVS 5. Bones- phos down with HD only- PTH 92 6. Hypokalemia- has needed  high K bath 7. GI issues- requiring TNA but now weaned off- albumin quite poor 8.  BOO- several days of not being able to void- on flomax and will just go ahead and place foley    [name] [name]    Labs: Basic Metabolic Panel:  Recent Labs Lab 06/25/15 0435 06/26/15 0845 06/27/15 0330 06/28/15 0435  NA 128* 135 134* 136  K 3.8 3.8 3.9 3.9  CL 96* 99* 98* 99*  CO2 19* 22 21* 23  GLUCOSE 135* 107* 111* 98  BUN 111* 60* 80* 45*  CREATININE 8.04* 5.91* 7.35* 5.25*  CALCIUM 8.5* 8.5* 8.9 8.4*  PHOS 5.8*  --  5.2* 3.7   Liver Function Tests:  Recent Labs Lab 06/23/15 0250  06/25/15 0435 06/27/15 0330 06/28/15 0435  AST 16  --   --   --   --   ALT 27  --   --   --   --   ALKPHOS 115  --   --   --   --   BILITOT 0.8  --   --   --   --   PROT 5.9*  --   --   --   --   ALBUMIN 1.8*  < > 1.7* 1.6*  1.5*  < > = values in this interval not displayed.  Recent Labs Lab 06/22/15 0350 06/23/15 0250 06/24/15 0340  LIPASE 130* 152* 122*  AMYLASE 113* 157* 145*   No results for input(s): AMMONIA in the last 168 hours. CBC:  Recent Labs Lab 06/23/15 0250 06/24/15 0340 06/25/15 0435 06/26/15 0445 06/27/15 0325 06/28/15 0435  WBC 11.8* 9.2 9.7 12.4* 10.0 8.7  NEUTROABS 9.5*  --   --   --   --   --   HGB 8.0* 7.2* 8.9* 8.5* 9.0* 9.0*  HCT 23.5* 21.5* 26.7* 25.8* 26.3* 28.0*  MCV 81.0 82.1 83.2 83.5 86.2 84.8  PLT 239 250 258 266 270 264   Cardiac Enzymes: No results for input(s): CKTOTAL, CKMB, CKMBINDEX, TROPONINI in the last 168 hours. CBG:  Recent Labs Lab 06/27/15 1308 06/27/15 1820 06/27/15 2012 06/28/15 0001 06/28/15 0404  GLUCAP 123* 153* 123* 75 94    Iron Studies:   Recent Labs  06/27/15 0325  IRON 18*  TIBC 167*  FERRITIN 562*   Studies/Results: Dg Chest Port 1 View  06/26/2015  CLINICAL DATA:  Diatek insertion EXAM: PORTABLE CHEST 1 VIEW COMPARISON:  06/20/2015 FINDINGS: Right IJ and left IJ catheters, both tips at the SVC level. No apical pneumothorax or new mediastinal widening. Normal heart size. New diffuse  interstitial coarsening with bronchial cuffing. IMPRESSION: 1. Unremarkable appearance of central catheters. 2. Pulmonary edema. Electronically Signed   By: Monte Fantasia M.D.   On: 06/26/2015 11:27   Medications: Infusions: . sodium chloride 20 mL/hr at 06/24/15 2300  . sodium chloride      Scheduled Medications: . alteplase  2 mg Intracatheter Once  . antiseptic oral rinse  7 mL Mouth Rinse q12n4p  . atorvastatin  20 mg Oral QHS  . chlorhexidine  15 mL Mouth Rinse BID  . darbepoetin (ARANESP) injection - NON-DIALYSIS  150 mcg Subcutaneous Q Mon-1800  . enoxaparin (LOVENOX) injection  30 mg Subcutaneous Q24H  . ferric gluconate (FERRLECIT/NULECIT) IV  125 mg Intravenous Daily  . insulin aspart  0-15 Units Subcutaneous 6 times per day  . metoprolol tartrate  25 mg Oral BID  . pantoprazole  40 mg Oral Daily  . sodium chloride flush  10-40 mL Intracatheter Q12H  . tamsulosin  0.4 mg Oral QPC supper  . Warfarin - Pharmacist Dosing Inpatient   Does not apply q1800    have reviewed scheduled and prn medications.  Physical Exam: General: alert- looks good Heart: RRR Lungs: CBS bilat Abdomen: soft, non tender Extremities: minimal edema Dialysis Access: new PC on left    06/28/2015,7:55 AM  LOS: 19 days

## 2015-06-29 LAB — RENAL FUNCTION PANEL
Albumin: 1.5 g/dL — ABNORMAL LOW (ref 3.5–5.0)
Anion gap: 13 (ref 5–15)
BUN: 62 mg/dL — ABNORMAL HIGH (ref 6–20)
CO2: 23 mmol/L (ref 22–32)
Calcium: 8.6 mg/dL — ABNORMAL LOW (ref 8.9–10.3)
Chloride: 101 mmol/L (ref 101–111)
Creatinine, Ser: 7.37 mg/dL — ABNORMAL HIGH (ref 0.61–1.24)
GFR calc Af Amer: 8 mL/min — ABNORMAL LOW (ref >60)
GFR calc non Af Amer: 7 mL/min — ABNORMAL LOW (ref >60)
Glucose, Bld: 108 mg/dL — ABNORMAL HIGH (ref 65–99)
Phosphorus: 5.2 mg/dL — ABNORMAL HIGH (ref 2.5–4.6)
Potassium: 3.9 mmol/L (ref 3.5–5.1)
Sodium: 137 mmol/L (ref 135–145)

## 2015-06-29 LAB — CBC
HCT: 26.8 % — ABNORMAL LOW (ref 39.0–52.0)
Hemoglobin: 8.6 g/dL — ABNORMAL LOW (ref 13.0–17.0)
MCH: 27.4 pg (ref 26.0–34.0)
MCHC: 32.1 g/dL (ref 30.0–36.0)
MCV: 85.4 fL (ref 78.0–100.0)
Platelets: 263 10*3/uL (ref 150–400)
RBC: 3.14 MIL/uL — ABNORMAL LOW (ref 4.22–5.81)
RDW: 16.1 % — ABNORMAL HIGH (ref 11.5–15.5)
WBC: 9.3 10*3/uL (ref 4.0–10.5)

## 2015-06-29 LAB — MAGNESIUM: Magnesium: 1.7 mg/dL (ref 1.7–2.4)

## 2015-06-29 LAB — PROTIME-INR
INR: 1.5 — ABNORMAL HIGH (ref 0.00–1.49)
Prothrombin Time: 18.2 seconds — ABNORMAL HIGH (ref 11.6–15.2)

## 2015-06-29 LAB — GLUCOSE, CAPILLARY
Glucose-Capillary: 101 mg/dL — ABNORMAL HIGH (ref 65–99)
Glucose-Capillary: 104 mg/dL — ABNORMAL HIGH (ref 65–99)
Glucose-Capillary: 122 mg/dL — ABNORMAL HIGH (ref 65–99)
Glucose-Capillary: 143 mg/dL — ABNORMAL HIGH (ref 65–99)
Glucose-Capillary: 74 mg/dL (ref 65–99)
Glucose-Capillary: 97 mg/dL (ref 65–99)
Glucose-Capillary: 99 mg/dL (ref 65–99)

## 2015-06-29 MED ORDER — CLONIDINE HCL 0.1 MG PO TABS: 0.1000 mg | ORAL_TABLET | Freq: Two times a day (BID) | ORAL | Status: DC

## 2015-06-29 MED ORDER — WARFARIN SODIUM 7.5 MG PO TABS
7.5000 mg | ORAL_TABLET | Freq: Once | ORAL | Status: AC
  Administered 2015-06-29: 7.5 mg via ORAL
  Filled 2015-06-29: qty 1

## 2015-06-29 NOTE — Progress Notes (Addendum)
[facility] Progress Note  Subjective  - POD #20  Feels better today after walking more yesterday. Having mild shortness of breath.   Objective Filed Vitals:   06/28/15 2031 06/29/15 0408  BP: 163/85 167/86  Pulse: 83 104  Temp: 98.1 F (36.7 C) 98.2 F (36.8 C)  Resp: 18 18    Intake/Output Summary (Last 24 hours) at 06/29/15 [id number] Last data filed at 06/29/15 0409  Gross per 24 hour  Intake    240 ml  Output    601 ml  Net   -361 ml   Sitting in chair in NAD Lungs with expiratory wheeze left, otherwise clear Heart irregularly irregular Abdomen non tender to palpation, no distension Mild edema right leg, minimal edema left Feet warm bilaterally   Assessment/Planning: 65 y.o. male is s/p: repair of juxtarenal AAA, left renal bypass, pancreatitis, AKI POD 20  Wheezing: respiratory for breathing treatment.  AKI: foley placed. 600 cc output yesterday. Needs OP HD center.  May need to plan for permanent access next week.  Afib: rates low 100s, coumadin restarted. INR 1.5 HTN: likely due to fluid overload. For HD today.  Continue to mobilize.   Alvia Grove 06/29/2015 7:33 AM --  Laboratory CBC    Component Value Date/Time   WBC 9.3 06/29/2015 0404   HGB 8.6* 06/29/2015 0404   HCT 26.8* 06/29/2015 0404   PLT 263 06/29/2015 0404    BMET    Component Value Date/Time   NA 137 06/29/2015 0404   K 3.9 06/29/2015 0404   CL 101 06/29/2015 0404   CO2 23 06/29/2015 0404   GLUCOSE 108* 06/29/2015 0404   BUN 62* 06/29/2015 0404   CREATININE 7.37* 06/29/2015 0404   CREATININE 1.10 11/07/2014 0804   CALCIUM 8.6* 06/29/2015 0404   GFRNONAA 7* 06/29/2015 0404   GFRNONAA 53* 05/14/2013 0905   GFRAA 8* 06/29/2015 0404   GFRAA 62 05/14/2013 0905    COAG Lab Results  Component Value Date   INR 1.50* 06/29/2015   INR 1.41 06/28/2015   INR 1.39 06/10/2015   No results found for: PTT  Antibiotics Anti-infectives    Start     Dose/Rate  Route Frequency Ordered Stop   06/26/15 0945  cefUROXime (ZINACEF) 1.5 g in dextrose 5 % 50 mL IVPB     1.5 g 100 mL/hr over 30 Minutes Intravenous To ShortStay Surgical 06/26/15 0931 06/26/15 1015   06/16/15 1300  cefTRIAXone (ROCEPHIN) 1 g in dextrose 5 % 50 mL IVPB     1 g 100 mL/hr over 30 Minutes Intravenous Every 24 hours 06/16/15 1154 06/19/15 1358   06/14/15 1200  vancomycin (VANCOCIN) IVPB 1000 mg/200 mL premix  Status:  Discontinued     1,000 mg 200 mL/hr over 60 Minutes Intravenous Every 24 hours 06/13/15 1014 06/15/15 1101   06/13/15 1200  piperacillin-tazobactam (ZOSYN) IVPB 3.375 g  Status:  Discontinued     3.375 g 100 mL/hr over 30 Minutes Intravenous 4 times per day 06/13/15 1014 06/16/15 1154   06/13/15 1015  vancomycin (VANCOCIN) 1,500 mg in sodium chloride 0.9 % 250 mL IVPB     1,500 mg 250 mL/hr over 60 Minutes Intravenous  Once 06/13/15 1014 06/13/15 1143   06/09/15 2000  cefUROXime (ZINACEF) 1.5 g in dextrose 5 % 50 mL IVPB     1.5 g 100 mL/hr over 30 Minutes Intravenous Every 12 hours 06/09/15 1522 06/10/15 0855   06/09/15 1130  cefUROXime (ZINACEF) 1.5 g  in dextrose 5 % 50 mL IVPB  Status:  Discontinued     1.5 g 100 mL/hr over 30 Minutes Intravenous To Surgery 06/09/15 1123 06/09/15 1509   06/09/15 0700  cefUROXime (ZINACEF) 1.5 g in dextrose 5 % 50 mL IVPB     1.5 g 100 mL/hr over 30 Minutes Intravenous To ShortStay Surgical 06/08/15 1434 06/09/15 1200       Virgina Jock, PA-C [facility] Office: [phone] Pager: [phone] 06/29/2015 7:33 AM     I have examined the patient, reviewed and agree with above. Still with end-stage renal disease. Still with some urine output 600 cc over the last 24 hours. More comfortable walking.  Curt Jews, MD 06/29/2015 9:36 AM

## 2015-06-29 NOTE — Progress Notes (Signed)
Subjective:  Now with foley and 600 of UOP last 24 hours    Objective Vital signs in last 24 hours: Filed Vitals:   06/28/15 2031 06/28/15 2300 06/29/15 0408 06/29/15 0456  BP: 163/85  167/86   Pulse: 83  104   Temp: 98.1 F (36.7 C)  98.2 F (36.8 C)   TempSrc: Oral  Oral   Resp: 18  18   Height:      Weight:  87.3 kg (192 lb 7.4 oz)    SpO2: 100%  95% 95%   Weight change: -4.5 kg (-9 lb 14.7 oz)  Intake/Output Summary (Last 24 hours) at 06/29/15 [id number] Last data filed at 06/29/15 0409  Gross per 24 hour  Intake    240 ml  Output    601 ml  Net   -361 ml    Assessment/ Plan: Pt is a 65 y.o. yo male who was admitted on 06/09/2015 with need for AAA repair with complication of AKI Assessment/Plan: 1. A on CRF- crt of 1.27 on 2/2-  AKI in the setting of a chronically nonfunctional right kdney and hemodynamic changes associated with AAA repair and left renal artery bypass on 2/6.  required CRRT 2/9- 2/13- then IHD 2/15 , 2/17  2/20 , 2/22 and 2/24- averaging about 500-600 of urine q day but had not been able to pass - s/p foley so we can keep track but also to spare him the repeated in and out caths.  As of now, numbers worsening in between HD treatments s/p  tunneled HD cath.  Will start process to look for OP HD unit and may need to talk about AVF/AVG next week if no further signs of recovery- follow labs and UOP over weekend.  I will write HD orders for tomorrow but have MD look at labs  first  2. HTN/vol- previously overloaded with hyponatremia- with UF  improves 3. Anemia- due to surgery and CKD- - started aranesp and transfuse as needed-  replete iron as well 4. S./p AAA repair and renal artery bypass - per [facility] 5. Bones- phos down with HD only- PTH 92 6. Hypokalemia- has needed  high K bath 7. GI issues- requiring TNA but now weaned off- albumin quite poor 8. BOO- several days of not being able to void- on flomax and placed foley on 2/25    Danyka Merlin  A    Labs: Basic Metabolic Panel:  Recent Labs Lab 06/27/15 0330 06/28/15 0435 06/29/15 0404  NA 134* 136 137  K 3.9 3.9 3.9  CL 98* 99* 101  CO2 21* 23 23  GLUCOSE 111* 98 108*  BUN 80* 45* 62*  CREATININE 7.35* 5.25* 7.37*  CALCIUM 8.9 8.4* 8.6*  PHOS 5.2* 3.7 5.2*   Liver Function Tests:  Recent Labs Lab 06/23/15 0250  06/27/15 0330 06/28/15 0435 06/29/15 0404  AST 16  --   --   --   --   ALT 27  --   --   --   --   ALKPHOS 115  --   --   --   --   BILITOT 0.8  --   --   --   --   PROT 5.9*  --   --   --   --   ALBUMIN 1.8*  < > 1.6* 1.5* 1.5*  < > = values in this interval not displayed.  Recent Labs Lab 06/23/15 0250 06/24/15 0340  LIPASE 152* 122*  AMYLASE 157* 145*  No results for input(s): AMMONIA in the last 168 hours. CBC:  Recent Labs Lab 06/23/15 0250  06/25/15 0435 06/26/15 0445 06/27/15 0325 06/28/15 0435 06/29/15 0404  WBC 11.8*  < > 9.7 12.4* 10.0 8.7 9.3  NEUTROABS 9.5*  --   --   --   --   --   --   HGB 8.0*  < > 8.9* 8.5* 9.0* 9.0* 8.6*  HCT 23.5*  < > 26.7* 25.8* 26.3* 28.0* 26.8*  MCV 81.0  < > 83.2 83.5 86.2 84.8 85.4  PLT 239  < > 258 266 270 264 263  < > = values in this interval not displayed. Cardiac Enzymes: No results for input(s): CKTOTAL, CKMB, CKMBINDEX, TROPONINI in the last 168 hours. CBG:  Recent Labs Lab 06/28/15 1650 06/28/15 2015 06/29/15 0005 06/29/15 0403 06/29/15 0816  GLUCAP 151* 134* 97 99 101*    Iron Studies:   Recent Labs  06/27/15 0325  IRON 18*  TIBC 167*  FERRITIN 562*   Studies/Results: No results found. Medications: Infusions: . sodium chloride 20 mL/hr at 06/24/15 2300  . sodium chloride      Scheduled Medications: . alteplase  2 mg Intracatheter Once  . antiseptic oral rinse  7 mL Mouth Rinse q12n4p  . atorvastatin  20 mg Oral QHS  . chlorhexidine  15 mL Mouth Rinse BID  . darbepoetin (ARANESP) injection - NON-DIALYSIS  150 mcg Subcutaneous Q Mon-1800  . enoxaparin  (LOVENOX) injection  30 mg Subcutaneous Q24H  . ferric gluconate (FERRLECIT/NULECIT) IV  125 mg Intravenous Daily  . insulin aspart  0-15 Units Subcutaneous 6 times per day  . metoprolol tartrate  25 mg Oral BID  . pantoprazole  40 mg Oral Daily  . sodium chloride flush  10-40 mL Intracatheter Q12H  . tamsulosin  0.4 mg Oral QPC supper  . Warfarin - Pharmacist Dosing Inpatient   Does not apply q1800    have reviewed scheduled and prn medications.  Physical Exam: General: alert- looks good Heart: RRR Lungs: CBS bilat Abdomen: soft, non tender Extremities: minimal edema Dialysis Access: new PC on left    06/29/2015,8:52 AM  LOS: 20 days

## 2015-06-29 NOTE — Progress Notes (Signed)
ANTICOAGULATION CONSULT NOTE  Pharmacy Consult for warfarin Indication: atrial fibrillation and stroke  No Known Allergies  Patient Measurements: Height: 6' (182.9 cm) Weight: 192 lb 7.4 oz (87.3 kg) IBW/kg (Calculated) : 77.6  Vital Signs: Temp: 98.2 F (36.8 C) (02/26 0408) Temp Source: Oral (02/26 0408) BP: 167/86 mmHg (02/26 0408) Pulse Rate: 104 (02/26 0408)  Labs:  Recent Labs  06/27/15 0325 06/27/15 0330 06/28/15 0435 06/29/15 0404  HGB 9.0*  --  9.0* 8.6*  HCT 26.3*  --  28.0* 26.8*  PLT 270  --  264 263  LABPROT  --   --  17.4* 18.2*  INR  --   --  1.41 1.50*  CREATININE  --  7.35* 5.25* 7.37*    Estimated Creatinine Clearance: 11.4 mL/min (by C-G formula based on Cr of 7.37).   Medical History: Past Medical History  Diagnosis Date  . Arteriosclerotic cardiovascular disease (ASCVD)     AMI in 2000 treated at [facility]; cath in 12/2006->  Chronic total obstruction of the RCA;  drug-eluting stent placed in M1; inferior hypokinesis with an EF of 45%  . Tobacco abuse, in remission     20 pack years; quit in 2009  . Hypertension   . Chronic anticoagulation   . Permanent atrial fibrillation (HCC)     on coumadin for couple of years, stopped plavix at that time  . PVD (peripheral vascular disease) ([place])     Ct angiogram in 2009 revealed stable disease with 80% celiac stenosis,50% right renal artery ,ASCVD with ulceration in the abdominal aortashe  . Nephrolithiasis   . Cerebrovascular disease 2002    carotid stent  . Hyperlipidemia   . Myocardial infarction ([place]) 10 yrs ago  . Testicular carcinoma ([place]) 1990    right orchiectomy  . AAA (abdominal aortic aneurysm) ([place]) 2017    3.3cm  . GERD (gastroesophageal reflux disease)   . Chronic combined systolic and diastolic CHF (congestive heart failure) [facility])     Assessment: 65 y/o male s/p elective repair of AAA and left renal artery bypass on 06/09/15 also with AKI on I-HD currently. Pharmacy consulted to  resume warfarin for hx Afib and stroke 2/24 after tunneled HD cath placed. No bleeding noted, Hb low stable, platelets are normal. INR 1.5 this AM.  PTA regimen: 5 mg daily except 7.5 mg Wed/Thurs  Goal of Therapy:  INR 2-3 Monitor platelets by anticoagulation protocol: Yes   Plan:  - Warfarin 7.5 mg PO tonight - Daily INR - D/c Lovenox when INR >2 - Monitor for s/sx of bleeding  Elicia Lamp, PharmD, BCPS Clinical Pharmacist Pager [phone] 06/29/2015 12:10 PM

## 2015-06-30 LAB — PROTIME-INR
INR: 1.93 — ABNORMAL HIGH (ref 0.00–1.49)
Prothrombin Time: 22 seconds — ABNORMAL HIGH (ref 11.6–15.2)

## 2015-06-30 LAB — GLUCOSE, CAPILLARY
Glucose-Capillary: 125 mg/dL — ABNORMAL HIGH (ref 65–99)
Glucose-Capillary: 103 mg/dL — ABNORMAL HIGH (ref 65–99)
Glucose-Capillary: 110 mg/dL — ABNORMAL HIGH (ref 65–99)
Glucose-Capillary: 74 mg/dL (ref 65–99)
Glucose-Capillary: 86 mg/dL (ref 65–99)

## 2015-06-30 LAB — RENAL FUNCTION PANEL
Albumin: 1.6 g/dL — ABNORMAL LOW (ref 3.5–5.0)
Anion gap: 16 — ABNORMAL HIGH (ref 5–15)
BUN: 75 mg/dL — ABNORMAL HIGH (ref 6–20)
CO2: 20 mmol/L — ABNORMAL LOW (ref 22–32)
Calcium: 8.5 mg/dL — ABNORMAL LOW (ref 8.9–10.3)
Chloride: 100 mmol/L — ABNORMAL LOW (ref 101–111)
Creatinine, Ser: 8.77 mg/dL — ABNORMAL HIGH (ref 0.61–1.24)
GFR calc Af Amer: 7 mL/min — ABNORMAL LOW (ref >60)
GFR calc non Af Amer: 6 mL/min — ABNORMAL LOW (ref >60)
Glucose, Bld: 169 mg/dL — ABNORMAL HIGH (ref 65–99)
Phosphorus: 6.8 mg/dL — ABNORMAL HIGH (ref 2.5–4.6)
Potassium: 3.9 mmol/L (ref 3.5–5.1)
Sodium: 136 mmol/L (ref 135–145)

## 2015-06-30 LAB — CBC
HCT: 27.5 % — ABNORMAL LOW (ref 39.0–52.0)
Hemoglobin: 8.7 g/dL — ABNORMAL LOW (ref 13.0–17.0)
MCH: 27.2 pg (ref 26.0–34.0)
MCHC: 31.6 g/dL (ref 30.0–36.0)
MCV: 85.9 fL (ref 78.0–100.0)
Platelets: 269 10*3/uL (ref 150–400)
RBC: 3.2 MIL/uL — ABNORMAL LOW (ref 4.22–5.81)
RDW: 16 % — ABNORMAL HIGH (ref 11.5–15.5)
WBC: 9.2 10*3/uL (ref 4.0–10.5)

## 2015-06-30 LAB — MAGNESIUM: Magnesium: 1.6 mg/dL — ABNORMAL LOW (ref 1.7–2.4)

## 2015-06-30 MED ORDER — WARFARIN SODIUM 5 MG PO TABS
5.0000 mg | ORAL_TABLET | Freq: Once | ORAL | Status: AC
  Administered 2015-06-30: 5 mg via ORAL
  Filled 2015-06-30: qty 1

## 2015-06-30 MED ORDER — INSULIN ASPART 100 UNIT/ML ~~LOC~~ SOLN
0.0000 [IU] | Freq: Three times a day (TID) | SUBCUTANEOUS | Status: DC
  Administered 2015-07-02 – 2015-07-03 (×2): 1 [IU] via SUBCUTANEOUS
  Administered 2015-07-04: 2 [IU] via SUBCUTANEOUS
  Administered 2015-07-04 – 2015-07-06 (×3): 1 [IU] via SUBCUTANEOUS

## 2015-06-30 NOTE — Progress Notes (Signed)
[facility] ROUNDING NOTE   Subjective:   Interval History:  Improved urine output  Objective:  Vital signs in last 24 hours:  Temp:  [97.7 F (36.5 C)-97.8 F (36.6 C)] 97.8 F (36.6 C) (02/27 0413) Pulse Rate:  [100-109] 100 (02/27 0413) Resp:  [17-18] 18 (02/27 0413) BP: (161-172)/(65-88) 172/88 mmHg (02/27 0413) SpO2:  [96 %-100 %] 96 % (02/27 0413) Weight:  [86.7 kg (191 lb 2.2 oz)] 86.7 kg (191 lb 2.2 oz) (02/27 0413)  Weight change: -0.6 kg (-1 lb 5.2 oz) Filed Weights   06/27/15 1341 06/28/15 2300 06/30/15 0413  Weight: 91.8 kg (202 lb 6.1 oz) 87.3 kg (192 lb 7.4 oz) 86.7 kg (191 lb 2.2 oz)    Intake/Output: I/O last 3 completed shifts: In: 480 [P.O.:480] Out: 852 [Urine:850; Stool:2]   Intake/Output this shift:     CVS- RRR RS- CTA ABD- BS present soft non-distended EXT- no edema   Basic Metabolic Panel:  Recent Labs Lab 06/25/15 0435 06/26/15 0445 06/26/15 0845 06/27/15 0325 06/27/15 0330 06/28/15 0435 06/29/15 0404 06/30/15 0241  NA 128*  --  135  --  134* 136 137 136  K 3.8  --  3.8  --  3.9 3.9 3.9 3.9  CL 96*  --  99*  --  98* 99* 101 100*  CO2 19*  --  22  --  21* 23 23 20*  GLUCOSE 135*  --  107*  --  111* 98 108* 169*  BUN 111*  --  60*  --  80* 45* 62* 75*  CREATININE 8.04*  --  5.91*  --  7.35* 5.25* 7.37* 8.77*  CALCIUM 8.5*  --  8.5*  --  8.9 8.4* 8.6* 8.5*  MG 2.0 1.9  --  2.0  --  1.7 1.7 1.6*  PHOS 5.8*  --   --   --  5.2* 3.7 5.2* 6.8*    Liver Function Tests:  Recent Labs Lab 06/25/15 0435 06/27/15 0330 06/28/15 0435 06/29/15 0404 06/30/15 0241  ALBUMIN 1.7* 1.6* 1.5* 1.5* 1.6*    Recent Labs Lab 06/24/15 0340  LIPASE 122*  AMYLASE 145*   No results for input(s): AMMONIA in the last 168 hours.  CBC:  Recent Labs Lab 06/26/15 0445 06/27/15 0325 06/28/15 0435 06/29/15 0404 06/30/15 0241  WBC 12.4* 10.0 8.7 9.3 9.2  HGB 8.5* 9.0* 9.0* 8.6* 8.7*  HCT 25.8* 26.3* 28.0* 26.8* 27.5*  MCV  83.5 86.2 84.8 85.4 85.9  PLT 266 270 264 263 269    Cardiac Enzymes: No results for input(s): CKTOTAL, CKMB, CKMBINDEX, TROPONINI in the last 168 hours.  BNP: Invalid input(s): POCBNP  CBG:  Recent Labs Lab 06/29/15 2101 06/29/15 2358 06/30/15 0406 06/30/15 0756 06/30/15 1118  GLUCAP 122* 74 125* 103* 110*    Microbiology: Results for orders placed or performed during the hospital encounter of 06/09/15  Culture, respiratory (NON-Expectorated)     Status: None   Collection Time: 06/13/15 10:08 AM  Result Value Ref Range Status   Specimen Description ENDOTRACHEAL  Final   Special Requests NONE  Final   Gram Stain   Final    ABUNDANT WBC PRESENT,BOTH PMN AND MONONUCLEAR FEW SQUAMOUS EPITHELIAL CELLS PRESENT ABUNDANT GRAM NEGATIVE RODS FEW GRAM POSITIVE COCCI IN PAIRS Performed at [facility]    Culture   Final    ABUNDANT SERRATIA MARCESCENS Performed at [facility]    Report Status 06/15/2015 FINAL  Final   Organism ID, Bacteria  SERRATIA MARCESCENS  Final      Susceptibility   Serratia marcescens - MIC*    CEFAZOLIN >=64 RESISTANT Resistant     CEFEPIME <=1 SENSITIVE Sensitive     CEFTAZIDIME <=1 SENSITIVE Sensitive     CEFTRIAXONE <=1 SENSITIVE Sensitive     CIPROFLOXACIN <=0.25 SENSITIVE Sensitive     GENTAMICIN <=1 SENSITIVE Sensitive     TOBRAMYCIN <=1 SENSITIVE Sensitive     TRIMETH/SULFA Value in next row Sensitive      <=20 SENSITIVE(NOTE)    * ABUNDANT SERRATIA MARCESCENS  Culture, blood (routine x 2)     Status: None   Collection Time: 06/20/15 11:57 AM  Result Value Ref Range Status   Specimen Description BLOOD RIGHT ANTECUBITAL  Final   Special Requests BOTTLES DRAWN AEROBIC AND ANAEROBIC 10CCS  Final   Culture NO GROWTH 5 DAYS  Final   Report Status 06/25/2015 FINAL  Final  Culture, blood (single) w Reflex to ID Panel     Status: None   Collection Time: 06/20/15  1:25 PM  Result Value Ref Range Status   Specimen Description  BLOOD CONFIRMED BY CLINITEST AS NEGATIVE  Final   Special Requests   Final    BOTTLES DRAWN AEROBIC AND ANAEROBIC  5CCS RIGHT IJ LINE   Culture NO GROWTH 5 DAYS  Final   Report Status 06/25/2015 FINAL  Final  Culture, blood (routine x 2)     Status: None   Collection Time: 06/20/15  1:25 PM  Result Value Ref Range Status   Specimen Description BLOOD CONFIRMED BY CLINITEST AS NEGATIVE  Final   Special Requests   Final    BOTTLES DRAWN AEROBIC AND ANAEROBIC 5CCS LEFT IJ LINE   Culture NO GROWTH 5 DAYS  Final   Report Status 06/25/2015 FINAL  Final    Coagulation Studies:  Recent Labs  06/28/15 0435 06/29/15 0404 06/30/15 0241  LABPROT 17.4* 18.2* 22.0*  INR 1.41 1.50* 1.93*    Urinalysis: No results for input(s): COLORURINE, LABSPEC, PHURINE, GLUCOSEU, HGBUR, BILIRUBINUR, KETONESUR, PROTEINUR, UROBILINOGEN, NITRITE, LEUKOCYTESUR in the last 72 hours.  Invalid input(s): APPERANCEUR    Imaging: No results found.   Medications:   . sodium chloride 20 mL/hr at 06/24/15 2300  . sodium chloride     . alteplase  2 mg Intracatheter Once  . antiseptic oral rinse  7 mL Mouth Rinse q12n4p  . atorvastatin  20 mg Oral QHS  . chlorhexidine  15 mL Mouth Rinse BID  . darbepoetin (ARANESP) injection - NON-DIALYSIS  150 mcg Subcutaneous Q Mon-1800  . enoxaparin (LOVENOX) injection  30 mg Subcutaneous Q24H  . insulin aspart  0-15 Units Subcutaneous 6 times per day  . insulin aspart  0-9 Units Subcutaneous TID WC  . metoprolol tartrate  25 mg Oral BID  . sodium chloride flush  10-40 mL Intracatheter Q12H  . tamsulosin  0.4 mg Oral QPC supper  . warfarin  5 mg Oral ONCE-1800  . Warfarin - Pharmacist Dosing Inpatient   Does not apply q1800   acetaminophen **OR** acetaminophen, bisacodyl, fentaNYL (SUBLIMAZE) injection, heparin, hydrALAZINE, ipratropium, labetalol, levalbuterol, oxyCODONE-acetaminophen, sodium chloride flush, zolpidem  Assessment/ Plan:  Pt is a 65 y.o. yo male who  was admitted on 06/09/2015 with need for AAA repair with complication of AKI Assessment/Plan: 1. A on CRF- crt of 1.27 on 2/2- AKI in the setting of a chronically nonfunctional right kdney and hemodynamic changes associated with AAA repair and left renal artery bypass on 2/6.  required CRRT 2/9- 2/13- then IHD 2/15 , 2/17 2/20 , 2/22 and 2/24- averaging about 500-600 of urine q day but had not been able to pass - s/p foley so we can keep track but also to spare him the repeated in and out caths    Will hold off on dialysis    2. HTN/vol-  Improved 3. Anemia- due to surgery and CKD- - started aranesp and transfuse as needed- replete iron as well 4. S./p AAA repair and renal artery bypass - per [facility] 5. Bones- phos down with HD only- PTH 92 6. Hypokalemia- has needed high K bath 7. GI issues- requiring TNA but now weaned off- albumin quite poor 8. BOO- several days of not being able to void- on flomax and placed foley on 2/25     LOS: 21 Vaudine Dutan W @TODAY @11 :48 AM

## 2015-06-30 NOTE — Progress Notes (Signed)
ANTICOAGULATION CONSULT NOTE - Follow Up Consult  Pharmacy Consult for Coumadin Indication: atrial fibrillation and stroke  No Known Allergies  Patient Measurements: Height: 6' (182.9 cm) Weight: 191 lb 2.2 oz (86.7 kg) IBW/kg (Calculated) : 77.6  Vital Signs: Temp: 97.8 F (36.6 C) (02/27 0413) Temp Source: Oral (02/27 0413) BP: 172/88 mmHg (02/27 0413) Pulse Rate: 100 (02/27 0413)  Labs:  Recent Labs  06/28/15 0435 06/29/15 0404 06/30/15 0241  HGB 9.0* 8.6* 8.7*  HCT 28.0* 26.8* 27.5*  PLT 264 263 269  LABPROT 17.4* 18.2* 22.0*  INR 1.41 1.50* 1.93*  CREATININE 5.25* 7.37* 8.77*    Estimated Creatinine Clearance: 9.6 mL/min (by C-G formula based on Cr of 8.77).  Assessment: 64yom on coumadin pta for afib and hx stroke. He underwent elective repair of his AAA along with left renal artery bypass on 2/6. Post-op course complicated by renal failure, first requiring CRRT, now on iHD. He had an HD tunneled catheter placed 2/23. Coumadin resumed 2/24. INR trending up to 1.93 with 7.5mg  doses. Hgb low but stable. No bleeding.  Home dose: 5mg  daily except 7.5mg  on Wed/Thurs  Goal of Therapy:  INR 2-3 Monitor platelets by anticoagulation protocol: Yes   Plan:  1) Coumadin 5mg  x 1 2) Daily INR  Deboraha Sprang 06/30/2015,9:25 AM

## 2015-06-30 NOTE — Progress Notes (Signed)
Removed every other staple from abdomin.  Thirty (30) removed and patient tolerated procedure well. Pt resting with call bell within reach.  Will continue to monitor. Payton Emerald, RN

## 2015-06-30 NOTE — Progress Notes (Signed)
[facility]  Subjective  - feels ok just weak   Objective 172/88 100 97.8 F (36.6 C) (Oral) 18 96%  Intake/Output Summary (Last 24 hours) at 06/30/15 0736 Last data filed at 06/30/15 0414  Gross per 24 hour  Intake    240 ml  Output    701 ml  Net   -461 ml   Abdomen soft non tender Extremities: feet warm  Assessment/Planning: A fib INR rapidly rising 1.93 today.  Coumadin per pharmacy need to monitor closely with protein calorie malnutrition so we don't overshoot  AAA left renal bypass Abdomen soft incision healing remove half of staples today  Renal failure currently requiring every other day dialysis but making 700 cc urine over last 24 hours time will tell if he recovers.  Would prefer to defer access for a few weeks with overall deconditioning and severe protein calorie malnutrition making him high risk for anesthesia and wound healing issues.  Will plan to get vein map at first outpt appt.  Pancreatitis tolerating diet pain free no nausea continue diet   Ruta Hinds 06/30/2015 7:36 AM --  Laboratory Lab Results:  Recent Labs  06/29/15 0404 06/30/15 0241  WBC 9.3 9.2  HGB 8.6* 8.7*  HCT 26.8* 27.5*  PLT 263 269   BMET  Recent Labs  06/29/15 0404 06/30/15 0241  NA 137 136  K 3.9 3.9  CL 101 100*  CO2 23 20*  GLUCOSE 108* 169*  BUN 62* 75*  CREATININE 7.37* 8.77*  CALCIUM 8.6* 8.5*    COAG Lab Results  Component Value Date   INR 1.93* 06/30/2015   INR 1.50* 06/29/2015   INR 1.41 06/28/2015   No results found for: PTT

## 2015-06-30 NOTE — Progress Notes (Signed)
Physical Therapy Treatment Patient Details Name: [name] [name] MRN: [id number] DOB: [birth year] Today's Date: 06/30/2015    History of Present Illness 65 yo Male with PMH significant for HTN, Atrial Fib on warfarin, CVA, and known AAA.   Underwent arterial aortogram with lower extremity 1/25 which discovered occluded R renal artery moderate calcific stenosis and bilateral iliac arteries right greater than left, and diseased suprarenal aorta. He was admitted on 06/09/15 for open repair of infrarenal aorta with L renal artery bypass and possible R renal artery bypass.    PT Comments    Patient progressing, has met initial PT goals, so updated today.  Feel if has 24 S for safety due to cognition deficits can go home with HHPT follow up.  Will continue acute level PT until d/c.  Follow Up Recommendations  Supervision/Assistance - 24 hour;Home health PT     Equipment Recommendations  None recommended by PT    Recommendations for Other Services       Precautions / Restrictions Precautions Precautions: Fall Precaution Comments: CVVHD transitioned to HD on 06/17/15 Restrictions Weight Bearing Restrictions: No    Mobility  Bed Mobility Overal bed mobility: Modified Independent             General bed mobility comments: pt in chair, but noted earlier he was getting unaided in the room until bed alarm went off and nursing staff responded  Transfers   Equipment used: Rolling walker (2 wheeled) Transfers: Sit to/from Stand Sit to Stand: Supervision         General transfer comment: uses UE's for transitions for safety  Ambulation/Gait Ambulation/Gait assistance: Supervision;Min guard Ambulation Distance (Feet): 320 Feet Assistive device: Rolling walker (2 wheeled)       General Gait Details: safe use of RW, able to way find without cues and to state directional turns when questioned.  increased RR with ambulation, but SpO2 97%on RA and HR max 132 a-fib   Stairs             Wheelchair Mobility    Modified Rankin (Stroke Patients Only)       Balance Overall balance assessment: Needs assistance         Standing balance support: No upper extremity supported Standing balance-Leahy Scale: Fair Standing balance comment: can stand without UE support, but needs walker for dynamic activities.                    Cognition Arousal/Alertness: Awake/alert Behavior During Therapy: Impulsive Overall Cognitive Status: Within Functional Limits for tasks assessed                 General Comments: still getting up in room on his own despite lots of education about calling for A    Exercises      General Comments        Pertinent Vitals/Pain Pain Assessment: No/denies pain    Home Living                      Prior Function            PT Goals (current goals can now be found in the care plan section) Acute Rehab PT Goals Time For Goal Achievement: 07/04/15 Progress towards PT goals: Goals met and updated - see care plan    Frequency  Min 3X/week    PT Plan Discharge plan needs to be updated    Co-evaluation  End of Session Equipment Utilized During Treatment: Gait belt Activity Tolerance: Patient tolerated treatment well Patient left: in bed;with call bell/phone within reach;with family/visitor present     Time: 5502-7142 PT Time Calculation (min) (ACUTE ONLY): 23 min  Charges:  $Gait Training: 8-22 mins $Therapeutic Activity: 8-22 mins                    G Codes:      Reginia Naas 07/11/15, 11:29 AM  Magda Kiel, [place] 07/11/2015

## 2015-06-30 NOTE — Progress Notes (Signed)
Pt at supervision level with PT today. No need for an intense inpt rehab admission at this level. Recommend home with [facility]. [phone]

## 2015-07-01 LAB — RENAL FUNCTION PANEL
Albumin: 1.7 g/dL — ABNORMAL LOW (ref 3.5–5.0)
Anion gap: 17 — ABNORMAL HIGH (ref 5–15)
BUN: 89 mg/dL — ABNORMAL HIGH (ref 6–20)
CO2: 22 mmol/L (ref 22–32)
Calcium: 8.6 mg/dL — ABNORMAL LOW (ref 8.9–10.3)
Chloride: 100 mmol/L — ABNORMAL LOW (ref 101–111)
Creatinine, Ser: 10.45 mg/dL — ABNORMAL HIGH (ref 0.61–1.24)
GFR calc Af Amer: 5 mL/min — ABNORMAL LOW (ref >60)
GFR calc non Af Amer: 5 mL/min — ABNORMAL LOW (ref >60)
Glucose, Bld: 89 mg/dL (ref 65–99)
Phosphorus: 9.6 mg/dL — ABNORMAL HIGH (ref 2.5–4.6)
Potassium: 4.6 mmol/L (ref 3.5–5.1)
Sodium: 139 mmol/L (ref 135–145)

## 2015-07-01 LAB — GLUCOSE, CAPILLARY
Glucose-Capillary: 112 mg/dL — ABNORMAL HIGH (ref 65–99)
Glucose-Capillary: 109 mg/dL — ABNORMAL HIGH (ref 65–99)
Glucose-Capillary: 100 mg/dL — ABNORMAL HIGH (ref 65–99)
Glucose-Capillary: 126 mg/dL — ABNORMAL HIGH (ref 65–99)

## 2015-07-01 LAB — CBC
HCT: 26.9 % — ABNORMAL LOW (ref 39.0–52.0)
Hemoglobin: 8.7 g/dL — ABNORMAL LOW (ref 13.0–17.0)
MCH: 27.7 pg (ref 26.0–34.0)
MCHC: 32.3 g/dL (ref 30.0–36.0)
MCV: 85.7 fL (ref 78.0–100.0)
Platelets: 262 10*3/uL (ref 150–400)
RBC: 3.14 MIL/uL — ABNORMAL LOW (ref 4.22–5.81)
RDW: 16.1 % — ABNORMAL HIGH (ref 11.5–15.5)
WBC: 8.4 10*3/uL (ref 4.0–10.5)

## 2015-07-01 LAB — MAGNESIUM: Magnesium: 1.8 mg/dL (ref 1.7–2.4)

## 2015-07-01 LAB — PROTIME-INR
INR: 3.27 — ABNORMAL HIGH (ref 0.00–1.49)
Prothrombin Time: 32.6 seconds — ABNORMAL HIGH (ref 11.6–15.2)

## 2015-07-01 MED ORDER — LABETALOL HCL 200 MG PO TABS
200.0000 mg | ORAL_TABLET | Freq: Two times a day (BID) | ORAL | Status: DC
  Administered 2015-07-01 – 2015-07-07 (×13): 200 mg via ORAL
  Filled 2015-07-01 (×13): qty 1

## 2015-07-01 NOTE — Progress Notes (Signed)
Utilization review completed.  

## 2015-07-01 NOTE — Care Management Important Message (Signed)
Important Message  Patient Details  Name: [name] [name] MRN: [id number] Date of Birth: [birth year]   Medicare Important Message Given:  Yes    Nathen May 07/01/2015, 3:22 PM

## 2015-07-01 NOTE — Discharge Summary (Signed)
[facility] Discharge Summary  Harvel Eisenman Thorley 1951/01/20 65 y.o. male  [id number]  Admission Date: 06/09/2015  Discharge Date: 07/07/2015  Physician: Elam Dutch, MD  Admission Diagnosis: Abdominal aortic aneurysm I71.4 Acute renal insufficiency N28.9  HPI:   This is a 65 y.o. male who presented for evaluation of abdominal aortic aneurysm with recent history of abdominal pain on 05/26/2015. This patient is followed by Dr. 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 This has not required surgical treatment thus far. Patient also has known mild renal insufficiency. He has a known occluded right renal artery and high-grade left stenosis of the left renal artery with associated hypertension. He had a creatinine of 1.21 on 04/09/15.  Patient had routine colonoscopy performed on January 19 which revealed multiple polyps and polypectomies were performed. Patient developed abdominal discomfort the following day. He was seen twice in the previous 3 days in the emergency department at [facility]. The abdominal pain has resolved. The patient had a CT angiogram of the abdomen and pelvis which revealed some enlargement of the pseudoaneurysm since last studied by Dr. Oneida Alar. Patient states that the abdominal pain has completely resolved. Patient was referred to [facility] for further evaluation and treatment plans.  Dr. Oneida Alar felt that his best option for repair would be open repair of his infrarenal aorta with left renal artery bypass and possible right renal artery bypass. Images and case were also reviewed with several of the [facility] partners who agree.   He underwent cardiology pre-operative clearance who repeated his ECHO. Considering his comorbidities, he was considered a moderate to high risk for surgery. Given his hypertension and heart rate of 100, he was started on metoprolol 25 mg twice daily.    Hospital Course:  The patient was admitted to the hospital and taken to the operating room on 06/09/2015 and underwent repair of juxtarenal abdominal aortic aneurysm and left renal artery bypass with 13mm Dacron. The aortic clamp was above the left renal below the right renal (right renal chronically occluded). At the conclusion of the case, the patient had approximately 10 cc/hr of urine. He had an estimated blood loss of 4L. He was taken to the intensive care unit on a ventilator in critical but stable condition. Critical care was consulted for assistance with vent management.   On day of surgery, the patient had some hypertension followed by hypotension after receiving labetalol. He was oliguric. He was given a 500 mL bolus and his systolic blood pressure increased to 150s-160s. He also had some atrial and ventricular PACs and PVCs intraop and post-op.   POD 1: He was overall stable. He was extubated to 4L [place] without any complications. His blood pressure was soft and he was given another bolus. Given his acidosis and hyperkalemia, nephrology was consulted for assistance with volume status. A left central venous dialysis catheter was placed in preparation for dialysis. There were no emergent indications for dialysis. His acidosis was treated with IV sodium bicarb. He was diuresed with IV lasix. He was following commands. He was started on lovenox for VTE prophylaxis.   POD 2: Overnight, the patient had atrial fibrillation with RVR in the 130s-140s. He was on IV metoprolol. Additionally, he was started on an amiodarone drip with bolus. He was anuric. His potassium and acidosis were well controlled. His creatinine continued to rise and a short  His chest x-ray showed some volume overload. He  underwent intermittent HD and 2L fluid was removed. His NG tube was kept in.   POD 3: He remained anuric. His anion gap metabolic acidosis was resolved with HD. He had thrombocytopenia of unknown etiology and a HIT  profile was sent for. He continued to have an ileus and his NG tube was kept in. His afib with RVR worsened on HD despite being on metoprolol and amiodarone. He was transitioned to CVVHD given lower BP. Cardiology was consulted. A one time dose of digoxin was given. He had a repeat ECHO.   POD 4: The patient had increasing oxygen requirements. His CXR revealed increasing fluid. He was reintubated. He was started on vancomycin and zosyn for possible HCAP. TPN was started. He had serrratia marcescens in his respiratory cultures.  POD 5: The patient was started on neosynephrine for blood pressure support. He was continued on CVVHD. His ECHO suggested an EF 35-40% which was unchanged compared to previous exam. His TSH was normal. His acute on chronic combined CHF was felt secondary to volume overload and acute renal failure. His afib with RVR improved after intubation. This was felt to be secondary to respiratory distress.   POD 6: The patient was extubated. He was off of pressors.   POD 7 (06/16/15): He was continued on an amiodarone drip. His HITT panel was normal and platelets stable. The patient self-discontinued his NG tube. He was kept NPO.   POD 8: The patient was still anuric. His creatinine was 3.0. He is currently off of CVVH. Nephrology will dialyze as needed. His heart rate had improved. He was continued on amiodarone. His antibiotics for HCAP were narrowed to to ceftriaxone. He was started on clear liquids.  POD 9: The patient was transitioned to intermittent HD given stable blood pressures over the past 72 hours. He has some nausea with clear liquids but seemed to be tolerating them well overall. He was advanced to full liquids. A renal duplex was checked to look at prognosis of return of renal function and to check left renal graft.  POD 10: The patient continued to complain of nausea. An abdominal x-ray was checked. His renal duplex showed a patent renal bypass. He was weaned off of  amiodarone. He was started on IV Lopressor 2.5 mg every 6 hours. His A. fib rate was relatively well controlled.  POD 11: He still complained of nausea. His abdominal x-ray showed no obstruction. His chest x-ray was negative. A postpyloric feeding tube was placed. His lipase and amylase were checked to rule out pancreatitis. His lipase and amylase were elevated consistent with pancreatitis. Plans were made for a tunneled dialysis catheter the following week. He was still anuric.  POD 12: The patient did not tolerate trophic tube feeds. He was kept on TPN. He had a bowel movement and diarrhea. He was stable for transfer out of the ICU. vHis hemoglobin was slowly drifting down with increased oxygen requirements. He was transfused 2 units with dialysis.  POD 13: The patient's hemoglobin was stable following transfusion of 2 units PRBCs.  POD 14 (06/23/15): The patient's amylase and lipase were stable overall. TPN was continued. Clear liquids were started. He was transferred to the floor.  POD 15: The patient's pancreatitis was improving. He was tolerating clear liquids. He was continued on clears and TPN was continued. He denied any further abdominal pain or nausea. His creatinine was trending down. He had 475 mL of urine output via straight cath. His hemoglobin was 7.2 and he was  transfused 2 units of PRBCs.  POD 16: He had about 500 mL of urine output with straight cath. His TPN was discontinued. He had a good response to 2 units of PRBCs. Nephrology still felt that he needed a tunneled dialysis catheter.  POD 17: The patient was taken to the operating room on 06/26/2015 for insertion of a left IJ tunneled dialysis catheter. Orders were placed or bladder scanned every shift and cath when needed.  POD 18: He was restarted on Coumadin. He had approximately 550 mL of urine via straight cath. His anemia was stable with hemoglobin of 9. He was tolerating a renal diet well. His A. fib rate was well  controlled.  POD 19: His creatinine was trending down to 5.25. He had 600 mL of urine output via straight cath. He had a Foley placed to avoid recurrent straight caths. He was started on Flomax.  POD 20: He had 600 mL of urine output but his creatinine was trending up.  POD 21: Half of his staples were removed. He made 700 mL of urine. Permanent access was deferred due to overall deconditioning and severe protein calorie malnutrition making him high risk for anesthesia and wound healing issues. Was tolerating a diet well.  POD 22 (07/01/15) : He had increased urine output of 1 L, but his creatinine continued to rise at 10.45.  POD 23-28: The remainder of his hospital stay consisted of observing his renal function. On POD 28 (07/07/15), his creatinine continued to improved and his urine output was around 1.5L. He was okay for discharge per nephrology. His dialysis catheter was supposed to be removed, but this was held secondary to therapeutic INR. He was scheduled for outpatient removal of his dialysis catheter on 07/14/15. He was discharged on POD 28 in good condition.   CBC    Component Value Date/Time   WBC 8.4 07/01/2015 0420   RBC 3.14* 07/01/2015 0420   HGB 8.7* 07/01/2015 0420   HCT 26.9* 07/01/2015 0420   PLT 262 07/01/2015 0420   MCV 85.7 07/01/2015 0420   MCH 27.7 07/01/2015 0420   MCHC 32.3 07/01/2015 0420   RDW 16.1* 07/01/2015 0420   LYMPHSABS 0.9 06/23/2015 0250   MONOABS 1.2* 06/23/2015 0250   EOSABS 0.1 06/23/2015 0250   BASOSABS 0.0 06/23/2015 0250    BMET    Component Value Date/Time   NA 139 07/01/2015 0420   K 4.6 07/01/2015 0420   CL 100* 07/01/2015 0420   CO2 22 07/01/2015 0420   GLUCOSE 89 07/01/2015 0420   BUN 89* 07/01/2015 0420   CREATININE 10.45* 07/01/2015 0420   CREATININE 1.10 11/07/2014 0804   CALCIUM 8.6* 07/01/2015 0420   GFRNONAA 5* 07/01/2015 0420   GFRNONAA 53* 05/14/2013 0905   GFRAA 5* 07/01/2015 0420   GFRAA 62 05/14/2013 0905      Discharge Instructions:   The patient is discharged to home with extensive instructions on wound care and progressive ambulation. They are instructed not to drive or perform any heavy lifting until returning to see the physician in his office.    Discharge Diagnosis:  Abdominal aortic aneurysm I71.4 Acute renal insufficiency N28.9  Secondary Diagnosis: Patient Active Problem List   Diagnosis Date Noted  . Acute on chronic combined systolic and diastolic CHF (congestive heart failure) ([place]) 06/19/2015  . Renal failure   . Status post abdominal aortic aneurysm (AAA) repair   . History of CVA with residual deficit   . Acute blood loss anemia   .  Tachypnea   . Leukocytosis   . Ileus ([place])   . Respiratory failure ([place])   . Acute respiratory failure with hypoxemia ([place])   . Acute on chronic kidney failure (HCC) - essentially anuric   . Pseudoaneurysm of aorta ([place]) 06/09/2015  . Acute respiratory failure with hypoxia ([place]) 06/09/2015  . CAD - s/p PCI to Cx. 100% RCA CTO 05/28/2015  . Abdominal pain 05/24/2015  . Dysphagia   . Hiatal hernia   . History of colonic polyps   . Hx of adenomatous colonic polyps 05/14/2015  . Constipation 04/22/2015  . Lumbago 04/22/2015  . Insomnia 07/16/2014  . Encounter for therapeutic drug monitoring 07/05/2013  . ED (erectile dysfunction) 06/17/2013  . Testicular mass 06/15/2013  . Chronic kidney disease 09/28/2011  . Seasonal allergies 07/01/2011  . GERD (gastroesophageal reflux disease) 06/01/2011  . Esophageal dysphagia 06/01/2011  . Family history of colon cancer 05/20/2011  . Chronic anticoagulation 09/24/2010  . Tobacco abuse   . Permanent atrial fibrillation ([place]) 11/13/2009  . ATHEROSCLEROTIC CARDIOVASCULAR DISEASE 11/13/2009  . PERIPHERAL VASCULAR DISEASE 11/13/2009  . Hyperlipidemia 10/24/2008  . Testicular cancer ([place]) 10/22/2008  . Essential hypertension 10/22/2008  . NEPHROLITHIASIS 10/22/2008   Past Medical History   Diagnosis Date  . Arteriosclerotic cardiovascular disease (ASCVD)     AMI in 2000 treated at [facility]; cath in 12/2006->  Chronic total obstruction of the RCA;  drug-eluting stent placed in M1; inferior hypokinesis with an EF of 45%  . Tobacco abuse, in remission     20 pack years; quit in 2009  . Hypertension   . Chronic anticoagulation   . Permanent atrial fibrillation (HCC)     on coumadin for couple of years, stopped plavix at that time  . PVD (peripheral vascular disease) ([place])     Ct angiogram in 2009 revealed stable disease with 80% celiac stenosis,50% right renal artery ,ASCVD with ulceration in the abdominal aortashe  . Nephrolithiasis   . Cerebrovascular disease 2002    carotid stent  . Hyperlipidemia   . Myocardial infarction ([place]) 10 yrs ago  . Testicular carcinoma ([place]) 1990    right orchiectomy  . AAA (abdominal aortic aneurysm) ([place]) 2017    3.3cm  . GERD (gastroesophageal reflux disease)   . Chronic combined systolic and diastolic CHF (congestive heart failure) ([place])        Medication List    ASK your doctor about these medications        albuterol 108 (90 Base) MCG/ACT inhaler  Commonly known as:  PROVENTIL HFA;VENTOLIN HFA  Inhale 2-4 puffs into the lungs every 4 (four) hours as needed for wheezing or shortness of breath.     amLODipine 10 MG tablet  Commonly known as:  NORVASC  take 1 tablet by mouth once daily     atorvastatin 20 MG tablet  Commonly known as:  LIPITOR  Take 1 tablet (20 mg total) by mouth at bedtime.     cloNIDine 0.1 MG tablet  Commonly known as:  CATAPRES  Take 1 tablet (0.1 mg total) by mouth 2 (two) times daily.     diphenhydrAMINE 25 MG tablet  Commonly known as:  BENADRYL  Take 25 mg by mouth daily as needed for allergies.     fexofenadine 180 MG tablet  Commonly known as:  ALLEGRA  Take 1 tablet (180 mg total) by mouth daily.     Fish Oil 1000 MG Caps  Take 1,000 mg by mouth daily.  lisinopril 40 MG tablet   Commonly known as:  PRINIVIL,ZESTRIL  Take 1 tablet (40 mg total) by mouth daily.     metoprolol tartrate 25 MG tablet  Commonly known as:  LOPRESSOR  Take 1 tablet (25 mg total) by mouth 2 (two) times daily.     multivitamin with minerals Tabs tablet  Take 1 tablet by mouth daily.     nitroGLYCERIN 0.4 MG SL tablet  Commonly known as:  NITROSTAT  place 1 tablet under the tongue if needed every 5 minutes for chest pain for 3 doses IF NO RELIEF AFTER 3RD DOSE CALL PRESCRIBER OR 911.     ONE-A-DAY MENS PO  Take 1 tablet by mouth daily.     pantoprazole 40 MG tablet  Commonly known as:  PROTONIX  Take 40 mg by mouth daily.     polyethylene glycol-electrolytes 420 g solution  Commonly known as:  NuLYTELY/GoLYTELY  Take 4,000 mLs by mouth once.     traZODone 50 MG tablet  Commonly known as:  DESYREL  Take 0.5-1 tablets (25-50 mg total) by mouth at bedtime as needed for sleep.     warfarin 5 MG tablet  Commonly known as:  COUMADIN  Take 1 1/2 tablets daily       Pain Rx Oxycodone #30 No Refill  Disposition: Home  Patient's condition: is Good  Follow up: 1. Dr. Oneida Alar in 2 weeks   Virgina Jock, PA-C [facility] [phone] 07/01/2015  11:44 AM   - For VQI Registry use ---   Post-op:  Time to Extubation: [ ]  In OR, [ ]  < 12 hrs, [x ] 12-24 hrs, [ ]  >=24 hrs Vasopressors Req. Post-op: Yes ICU Stay: 14 days Transfusion: Yes  If yes, 4 units given MI: No, [ ]  Troponin only, [ ]  EKG or Clinical New Arrhythmia: No  Complications: CHF: Yes Resp failure: Yes, [ ]  Pneumonia, [x ] Ventilator Chg in renal function: Yes, [x ] Inc. Cr > 0.5, [x ] Temp. Dialysis, [ ]  Permanent dialysis  Leg ischemia: No, no Surgery needed, [ ]  Yes, Surgery needed, [ ]  Amputation Bowel ischemia: No, [ ]  Medical Rx, [ ]  Surgical Rx Wound complication: No, [ ]  Superficial separation/infection, [ ]  Return to OR Return to OR: No  Return to OR for bleeding:  No Stroke: No, [ ]  Minor, [ ]  Major  Discharge medications: Statin use:  Yes If No: [ ]  For Medical reasons, [ ]  Non-compliant ASA use:  No  If No: [x ] For Medical reasons, [ ]  Non-compliant Plavix use:  No If No: [x ] For Medical reasons, on coumadin, [ ]  Non-compliant Beta blocker use:  Yes If No: [ ]  For Medical reasons, [ ]  Non-compliant

## 2015-07-01 NOTE — Progress Notes (Signed)
ANTICOAGULATION CONSULT NOTE - Follow Up Consult  Pharmacy Consult for Coumadin Indication: atrial fibrillation and stroke  No Known Allergies  Patient Measurements: Height: 6' (182.9 cm) Weight: 190 lb 7.6 oz (86.4 kg) IBW/kg (Calculated) : 77.6  Vital Signs: Temp: 97.4 F (36.3 C) (02/28 0426) Temp Source: Oral (02/28 0426) BP: 166/74 mmHg (02/28 0426) Pulse Rate: 107 (02/28 0426)  Labs:  Recent Labs  06/29/15 0404 06/30/15 0241 07/01/15 0420  HGB 8.6* 8.7* 8.7*  HCT 26.8* 27.5* 26.9*  PLT 263 269 262  LABPROT 18.2* 22.0* 32.6*  INR 1.50* 1.93* 3.27*  CREATININE 7.37* 8.77* 10.45*    Estimated Creatinine Clearance: 8 mL/min (by C-G formula based on Cr of 10.45).  Assessment: 64yom on coumadin pta for afib and hx stroke. He underwent elective repair of his AAA along with left renal artery bypass on 2/6. Post-op course complicated by renal failure, first requiring CRRT, now on iHD. He had an HD tunneled catheter placed 2/23. Coumadin resumed 2/24. INR with significant jump today 1.9 --> 3.27, now supratherapeutic. Hgb low but stable. No bleeding.  Home dose: 5mg  daily except 7.5mg  on Wed/Thurs  Goal of Therapy:  INR 2-3 Monitor platelets by anticoagulation protocol: Yes   Plan:  1) No coumadin tonight 2) D/C lovenox 3) INR in AM  Deboraha Sprang 07/01/2015,8:42 AM

## 2015-07-01 NOTE — Progress Notes (Addendum)
[facility] Progress Note  Subjective  - POD #21  Vomited yesterday after lunch. Denies abdominal pain. Tolerated dinner ok. Walked "a lot" yesterday. Denies SOB and chest discomfort.   Objective Filed Vitals:   06/30/15 2131 07/01/15 0426  BP: 168/87 166/74  Pulse: 112 107  Temp: 97.7 F (36.5 C) 97.4 F (36.3 C)  Resp: 20 20    Intake/Output Summary (Last 24 hours) at 07/01/15 0738 Last data filed at 07/01/15 0430  Gross per 24 hour  Intake    480 ml  Output   1001 ml  Net   -521 ml   Heart irregularly irregular Lungs clear bilaterally Abdomen soft and non tender. Incision healing well. Feet are warm bilaterally.   Assessment/Planning: 65 y.o. male s/p repair of juxtarenal AAA, left renal bypass, pancreatitis, AKI 21 days post-op  INR supratherapeutic today at 3.27 AKI: 1000 cc UOP yesterday. HD held off yesterday. Hold on permanent access for several weeks.  Had remote episode of vomiting after lunch yesterday. Otherwise tolerating diet well.  Continue to mobilize.   Alvia Grove 07/01/2015 7:38 AM -- Agree with above.  Although urine output is improving he is still not clearing that well.  HD per Neph recs. Continue to trend INR and Hgb as well as renal function Hopefully d/c home by the end of the week  Ruta Hinds, MD [facility] [place]: [phone] Pager: [phone]  Laboratory CBC    Component Value Date/Time   WBC 8.4 07/01/2015 0420   HGB 8.7* 07/01/2015 0420   HCT 26.9* 07/01/2015 0420   PLT 262 07/01/2015 0420    BMET    Component Value Date/Time   NA 139 07/01/2015 0420   K 4.6 07/01/2015 0420   CL 100* 07/01/2015 0420   CO2 22 07/01/2015 0420   GLUCOSE 89 07/01/2015 0420   BUN 89* 07/01/2015 0420   CREATININE 10.45* 07/01/2015 0420   CREATININE 1.10 11/07/2014 0804   CALCIUM 8.6* 07/01/2015 0420   GFRNONAA 5* 07/01/2015 0420   GFRNONAA 53* 05/14/2013 0905   GFRAA 5*  07/01/2015 0420   GFRAA 62 05/14/2013 0905    COAG Lab Results  Component Value Date   INR 3.27* 07/01/2015   INR 1.93* 06/30/2015   INR 1.50* 06/29/2015   No results found for: PTT  Antibiotics Anti-infectives    Start     Dose/Rate Route Frequency Ordered Stop   06/26/15 0945  cefUROXime (ZINACEF) 1.5 g in dextrose 5 % 50 mL IVPB     1.5 g 100 mL/hr over 30 Minutes Intravenous To ShortStay Surgical 06/26/15 0931 06/26/15 1015   06/16/15 1300  cefTRIAXone (ROCEPHIN) 1 g in dextrose 5 % 50 mL IVPB     1 g 100 mL/hr over 30 Minutes Intravenous Every 24 hours 06/16/15 1154 06/19/15 1358   06/14/15 1200  vancomycin (VANCOCIN) IVPB 1000 mg/200 mL premix  Status:  Discontinued     1,000 mg 200 mL/hr over 60 Minutes Intravenous Every 24 hours 06/13/15 1014 06/15/15 1101   06/13/15 1200  piperacillin-tazobactam (ZOSYN) IVPB 3.375 g  Status:  Discontinued     3.375 g 100 mL/hr over 30 Minutes Intravenous 4 times per day 06/13/15 1014 06/16/15 1154   06/13/15 1015  vancomycin (VANCOCIN) 1,500 mg in sodium chloride 0.9 % 250 mL IVPB     1,500 mg 250 mL/hr over 60 Minutes Intravenous  Once 06/13/15 1014 06/13/15 1143   06/09/15 2000  cefUROXime (ZINACEF) 1.5  g in dextrose 5 % 50 mL IVPB     1.5 g 100 mL/hr over 30 Minutes Intravenous Every 12 hours 06/09/15 1522 06/10/15 0855   06/09/15 1130  cefUROXime (ZINACEF) 1.5 g in dextrose 5 % 50 mL IVPB  Status:  Discontinued     1.5 g 100 mL/hr over 30 Minutes Intravenous To Surgery 06/09/15 1123 06/09/15 1509   06/09/15 0700  cefUROXime (ZINACEF) 1.5 g in dextrose 5 % 50 mL IVPB     1.5 g 100 mL/hr over 30 Minutes Intravenous To ShortStay Surgical 06/08/15 1434 06/09/15 [place], PA-C [facility] Office: [phone] Pager: [phone] 07/01/2015 7:38 AM

## 2015-07-01 NOTE — Progress Notes (Signed)
[facility] ROUNDING NOTE   Subjective:   Interval History: good urine ouput}  Objective:  Vital signs in last 24 hours:  Temp:  [97.4 F (36.3 C)-98.2 F (36.8 C)] 98.2 F (36.8 C) (02/28 1230) Pulse Rate:  [99-124] 124 (02/28 1230) Resp:  [18-20] 18 (02/28 1230) BP: (156-168)/(74-90) 156/90 mmHg (02/28 1230) SpO2:  [95 %-100 %] 95 % (02/28 1230) Weight:  [86.4 kg (190 lb 7.6 oz)] 86.4 kg (190 lb 7.6 oz) (02/28 0426)  Weight change: -0.3 kg (-10.6 oz) Filed Weights   06/28/15 2300 06/30/15 0413 07/01/15 0426  Weight: 87.3 kg (192 lb 7.4 oz) 86.7 kg (191 lb 2.2 oz) 86.4 kg (190 lb 7.6 oz)    Intake/Output: I/O last 3 completed shifts: In: 720 [P.O.:720] Out: 1251 [Urine:1250; Stool:1]   Intake/Output this shift:  Total I/O In: 240 [P.O.:240] Out: -   CVS- RRR RS- CTA ABD- BS present soft non-distended EXT- no edema   Basic Metabolic Panel:  Recent Labs Lab 06/27/15 0325 06/27/15 0330 06/28/15 0435 06/29/15 0404 06/30/15 0241 07/01/15 0420  NA  --  134* 136 137 136 139  K  --  3.9 3.9 3.9 3.9 4.6  CL  --  98* 99* 101 100* 100*  CO2  --  21* 23 23 20* 22  GLUCOSE  --  111* 98 108* 169* 89  BUN  --  80* 45* 62* 75* 89*  CREATININE  --  7.35* 5.25* 7.37* 8.77* 10.45*  CALCIUM  --  8.9 8.4* 8.6* 8.5* 8.6*  MG 2.0  --  1.7 1.7 1.6* 1.8  PHOS  --  5.2* 3.7 5.2* 6.8* 9.6*    Liver Function Tests:  Recent Labs Lab 06/27/15 0330 06/28/15 0435 06/29/15 0404 06/30/15 0241 07/01/15 0420  ALBUMIN 1.6* 1.5* 1.5* 1.6* 1.7*   No results for input(s): LIPASE, AMYLASE in the last 168 hours. No results for input(s): AMMONIA in the last 168 hours.  CBC:  Recent Labs Lab 06/27/15 0325 06/28/15 0435 06/29/15 0404 06/30/15 0241 07/01/15 0420  WBC 10.0 8.7 9.3 9.2 8.4  HGB 9.0* 9.0* 8.6* 8.7* 8.7*  HCT 26.3* 28.0* 26.8* 27.5* 26.9*  MCV 86.2 84.8 85.4 85.9 85.7  PLT 270 264 263 269 262    Cardiac Enzymes: No results for input(s):  CKTOTAL, CKMB, CKMBINDEX, TROPONINI in the last 168 hours.  BNP: Invalid input(s): POCBNP  CBG:  Recent Labs Lab 06/30/15 1118 06/30/15 1615 06/30/15 2130 07/01/15 0749 07/01/15 1122  GLUCAP 110* 74 86 112* 109*    Microbiology: Results for orders placed or performed during the hospital encounter of 06/09/15  Culture, respiratory (NON-Expectorated)     Status: None   Collection Time: 06/13/15 10:08 AM  Result Value Ref Range Status   Specimen Description ENDOTRACHEAL  Final   Special Requests NONE  Final   Gram Stain   Final    ABUNDANT WBC PRESENT,BOTH PMN AND MONONUCLEAR FEW SQUAMOUS EPITHELIAL CELLS PRESENT ABUNDANT GRAM NEGATIVE RODS FEW GRAM POSITIVE COCCI IN PAIRS Performed at [facility]    Culture   Final    ABUNDANT SERRATIA MARCESCENS Performed at [facility]    Report Status 06/15/2015 FINAL  Final   Organism ID, Bacteria SERRATIA MARCESCENS  Final      Susceptibility   Serratia marcescens - MIC*    CEFAZOLIN >=64 RESISTANT Resistant     CEFEPIME <=1 SENSITIVE Sensitive     CEFTAZIDIME <=1 SENSITIVE Sensitive     CEFTRIAXONE <=1 SENSITIVE  Sensitive     CIPROFLOXACIN <=0.25 SENSITIVE Sensitive     GENTAMICIN <=1 SENSITIVE Sensitive     TOBRAMYCIN <=1 SENSITIVE Sensitive     TRIMETH/SULFA Value in next row Sensitive      <=20 SENSITIVE(NOTE)    * ABUNDANT SERRATIA MARCESCENS  Culture, blood (routine x 2)     Status: None   Collection Time: 06/20/15 11:57 AM  Result Value Ref Range Status   Specimen Description BLOOD RIGHT ANTECUBITAL  Final   Special Requests BOTTLES DRAWN AEROBIC AND ANAEROBIC 10CCS  Final   Culture NO GROWTH 5 DAYS  Final   Report Status 06/25/2015 FINAL  Final  Culture, blood (single) w Reflex to ID Panel     Status: None   Collection Time: 06/20/15  1:25 PM  Result Value Ref Range Status   Specimen Description BLOOD CONFIRMED BY CLINITEST AS NEGATIVE  Final   Special Requests   Final    BOTTLES DRAWN AEROBIC  AND ANAEROBIC  5CCS RIGHT IJ LINE   Culture NO GROWTH 5 DAYS  Final   Report Status 06/25/2015 FINAL  Final  Culture, blood (routine x 2)     Status: None   Collection Time: 06/20/15  1:25 PM  Result Value Ref Range Status   Specimen Description BLOOD CONFIRMED BY CLINITEST AS NEGATIVE  Final   Special Requests   Final    BOTTLES DRAWN AEROBIC AND ANAEROBIC 5CCS LEFT IJ LINE   Culture NO GROWTH 5 DAYS  Final   Report Status 06/25/2015 FINAL  Final    Coagulation Studies:  Recent Labs  06/29/15 0404 06/30/15 0241 07/01/15 0420  LABPROT 18.2* 22.0* 32.6*  INR 1.50* 1.93* 3.27*    Urinalysis: No results for input(s): COLORURINE, LABSPEC, PHURINE, GLUCOSEU, HGBUR, BILIRUBINUR, KETONESUR, PROTEINUR, UROBILINOGEN, NITRITE, LEUKOCYTESUR in the last 72 hours.  Invalid input(s): APPERANCEUR    Imaging: No results found.   Medications:   . sodium chloride 20 mL/hr at 06/24/15 2300  . sodium chloride     . alteplase  2 mg Intracatheter Once  . antiseptic oral rinse  7 mL Mouth Rinse q12n4p  . atorvastatin  20 mg Oral QHS  . chlorhexidine  15 mL Mouth Rinse BID  . darbepoetin (ARANESP) injection - NON-DIALYSIS  150 mcg Subcutaneous Q Mon-1800  . insulin aspart  0-9 Units Subcutaneous TID WC  . labetalol  200 mg Oral BID  . sodium chloride flush  10-40 mL Intracatheter Q12H  . tamsulosin  0.4 mg Oral QPC supper  . Warfarin - Pharmacist Dosing Inpatient   Does not apply q1800   acetaminophen **OR** acetaminophen, bisacodyl, fentaNYL (SUBLIMAZE) injection, heparin, hydrALAZINE, ipratropium, labetalol, levalbuterol, oxyCODONE-acetaminophen, sodium chloride flush, zolpidem  Assessment/ Plan:  1. A on CRF- crt of 1.27 on 2/2- AKI in the setting of a chronically nonfunctional right kdney and hemodynamic changes associated with AAA repair and left renal artery bypass on 2/6. required CRRT 2/9- 2/13- then IHD 2/15 , 2/17 2/20 , 2/22 and 2/24- averaging about 500-600 of urine q day  but had not been able to pass - s/p foley so we can keep track but also to spare him the repeated in and out caths Will hold off on dialysis  2. HTN/vol- Improved  Change metoprolol to labetalol 3. Anemia- due to surgery and CKD- - started aranesp and transfuse as needed- replete iron as well 4. S./p AAA repair and renal artery bypass - per VVS 5. Bones- phos down with HD only- PTH 92  6. Hypokalemia-  Stable 7. GI issues- requiring TNA but now weaned off- albumin quite poor 8. BOO- several days of not being able to void- on flomax and placed foley on 2/25  Creatinine continues to worsen will need dialysis most likely will check labs in AM      LOS: 52 [name] [name] W @TODAY @12 :31 PM

## 2015-07-01 NOTE — Progress Notes (Signed)
Removed abdominal incision staples. Patient tolerated well. 32 staples removed. All intact. Skin cleansed. Incision clean, dry, and intact. Closed margins. Will continue to monitor.  Cyndia Bent

## 2015-07-01 NOTE — Progress Notes (Signed)
Physical Therapy Treatment Patient Details Name: [name] [name] MRN: [id number] DOB: 1950/10/20 Today's Date: 07/01/2015    History of Present Illness 65 yo Male with PMH significant for HTN, Atrial Fib on warfarin, CVA, and known AAA.   Underwent arterial aortogram with lower extremity 1/25 which discovered occluded R renal artery moderate calcific stenosis and bilateral iliac arteries right greater than left, and diseased suprarenal aorta. He was admitted on 06/09/15 for open repair of infrarenal aorta with L renal artery bypass and possible R renal artery bypass.    PT Comments    Pt performed increased activity and ambulated without RW HR elevated 110-143 per cardiac monitor.  Pt required seated rest periods during HR elevated.  Cardiac monitor reports AFIB.  Pt performed stair training with LOB requiring mod assist to correct.  Pt performed standing therapeutic exercise to improve strength.    Follow Up Recommendations  Supervision/Assistance - 24 hour;Home health PT     Equipment Recommendations       Recommendations for Other Services       Precautions / Restrictions Precautions Precautions: Fall Restrictions Weight Bearing Restrictions: No    Mobility  Bed Mobility Overal bed mobility: Modified Independent Bed Mobility: Rolling;Supine to Sit;Sit to Supine Rolling: Modified independent (Device/Increase time)   Supine to sit: Modified independent (Device/Increase time) Sit to supine: Modified independent (Device/Increase time)   General bed mobility comments: Pt performed with safe technique.    Transfers Overall transfer level: Needs assistance Equipment used: Rolling walker (2 wheeled) Transfers: Sit to/from Stand Sit to Stand: Supervision Stand pivot transfers: Supervision       General transfer comment: cues to push from seated surface with RUE vs. pulling on RW.  Ambulation/Gait Ambulation/Gait assistance: Min guard;Min assist Ambulation Distance (Feet):  280 Feet Assistive device: None Gait Pattern/deviations: Step-through pattern;Decreased stride length;Decreased weight shift to right;Decreased weight shift to left     General Gait Details: Decreased reciprocal armswing.  pt fatigue quickly, no RW used during intervention to challenge balance but RW remains needed for safety without PT intervention.  Pt educated to use RW if walking with staff.  Pt required cues to increase step length and reciprocal armswing.     Stairs Stairs: Yes Stairs assistance: Min assist (required mod assist on first step after poor foot placement and unable to ascend, PTA assisted pt to regain balance and B rails used for support to negotiate x 5 stairs with min assist.  ) Stair Management: One rail Right;One rail Left Number of Stairs: 6 (1 stair with R rail unable to negotiate due to LOB, x5 stairs with B rails support and min assist.  ) General stair comments: Pt required cues for safety and foot placement to improve technique.  pt educated to place entire foot on stair before ascending.  pt required mod VCs for safety.  Forward negotiation to ascend and backwards negotiation to descend stairs. Pt required rest break post stair training with presentation of DOE and fatigue.    Wheelchair Mobility    Modified Rankin (Stroke Patients Only)       Balance                                    Cognition Arousal/Alertness: Awake/alert Behavior During Therapy: Impulsive Overall Cognitive Status: Within Functional Limits for tasks assessed Area of Impairment: Safety/judgement   Current Attention Level: Focused   Following Commands: Follows one step commands  consistently Safety/Judgement: Decreased awareness of safety     General Comments: still getting up in room on his own despite lots of education about calling for A    Exercises General Exercises - Lower Extremity Hip ABduction/ADduction: AROM;Standing;10 reps Hip Flexion/Marching:  AROM;Standing;10 reps Heel Raises: AROM;10 reps;Standing Mini-Sqauts: AROM;Standing;10 reps Other Exercises Other Exercises: Pt required seated rest breaks inbetween sets due to elevated HR.      General Comments        Pertinent Vitals/Pain Pain Assessment: No/denies pain    Home Living                      Prior Function            PT Goals (current goals can now be found in the care plan section) Acute Rehab PT Goals Potential to Achieve Goals: Good Progress towards PT goals: Progressing toward goals    Frequency  Min 3X/week    PT Plan      Co-evaluation             End of Session Equipment Utilized During Treatment: Gait belt Activity Tolerance: Patient tolerated treatment well Patient left: in bed;with call bell/phone within reach;with family/visitor present     Time: [id number] PT Time Calculation (min) (ACUTE ONLY): 28 min  Charges:  $Gait Training: 8-22 mins $Therapeutic Exercise: 8-22 mins                    G Codes:      Cristela Blue 2015/07/03, 10:35 AM  Governor Rooks, PTA pager [phone]

## 2015-07-01 NOTE — Progress Notes (Signed)
Occupational Therapy Treatment Patient Details Name: [name] [name] MRN: [id number] DOB: December 20, 1950 Today's Date: 07/01/2015    History of present illness 65 yo Male with PMH significant for HTN, Atrial Fib on warfarin, CVA, and known AAA.   Underwent arterial aortogram with lower extremity 1/25 which discovered occluded R renal artery moderate calcific stenosis and bilateral iliac arteries right greater than left, and diseased suprarenal aorta. He was admitted on 06/09/15 for open repair of infrarenal aorta with L renal artery bypass and possible R renal artery bypass.   OT comments  Pt able to gather and perform grooming at sink x 3 activities with min guard assist. One LOB when drying hair in standing, but did not require assist to correct. Improved attention.   Follow Up Recommendations  Home health OT;Supervision/Assistance - 24 hour    Equipment Recommendations       Recommendations for Other Services      Precautions / Restrictions Precautions Precautions: Fall       Mobility Bed Mobility Overal bed mobility: Modified Independent             General bed mobility comments: used rail, HOB flat  Transfers Overall transfer level: Needs assistance Equipment used: None Transfers: Sit to/from Stand Sit to Stand: Supervision              Balance     Sitting balance-Leahy Scale: Good       Standing balance-Leahy Scale: Fair                     ADL Overall ADL's : Needs assistance/impaired     Grooming: Brushing hair;Oral care;Wash/dry hands;Min guard Grooming Details (indicate cue type and reason): on LOB when drying hair following shampoo cap                             Functional mobility during ADLs: Min guard (no device) General ADL Comments: Pt with improved attention and less flat, making jokes. Still not able to enjoy TV as he once did.      Vision                     Perception     Praxis      Cognition    Behavior During Therapy: Impulsive Overall Cognitive Status: Impaired/Different from baseline Area of Impairment: Safety/judgement   Current Attention Level: Sustained Memory: Decreased short-term memory    Safety/Judgement: Decreased awareness of safety;Decreased awareness of deficits   Problem Solving: Slow processing General Comments: Pt able to identify and retrieve items for ADL around room with min guard assist. Recalled to take foley with him as he walked.    Extremity/Trunk Assessment               Exercises     Shoulder Instructions       General Comments      Pertinent Vitals/ Pain       Pain Assessment: No/denies pain  Home Living                                          Prior Functioning/Environment              Frequency Min 2X/week     Progress Toward Goals  OT Goals(current goals can now be found in the care plan  section)  Progress towards OT goals: Progressing toward goals  Acute Rehab OT Goals Patient Stated Goal: go home  Plan Discharge plan needs to be updated    Co-evaluation                 End of Session Equipment Utilized During Treatment: Gait belt   Activity Tolerance Patient tolerated treatment well   Patient Left in chair;with call bell/phone within reach   Nurse Communication          Time: [id number] OT Time Calculation (min): 23 min  Charges: OT General Charges $OT Visit: 1 Procedure OT Treatments $Self Care/Home Management : 23-37 mins  [name] 07/01/2015, 4:21 PM  [phone]

## 2015-07-02 LAB — RENAL FUNCTION PANEL
Albumin: 1.6 g/dL — ABNORMAL LOW (ref 3.5–5.0)
Anion gap: 18 — ABNORMAL HIGH (ref 5–15)
BUN: 102 mg/dL — ABNORMAL HIGH (ref 6–20)
CO2: 19 mmol/L — ABNORMAL LOW (ref 22–32)
Calcium: 8.4 mg/dL — ABNORMAL LOW (ref 8.9–10.3)
Chloride: 101 mmol/L (ref 101–111)
Creatinine, Ser: 11.48 mg/dL — ABNORMAL HIGH (ref 0.61–1.24)
GFR calc Af Amer: 5 mL/min — ABNORMAL LOW (ref >60)
GFR calc non Af Amer: 4 mL/min — ABNORMAL LOW (ref >60)
Glucose, Bld: 87 mg/dL (ref 65–99)
Phosphorus: 10.6 mg/dL — ABNORMAL HIGH (ref 2.5–4.6)
Potassium: 4.3 mmol/L (ref 3.5–5.1)
Sodium: 138 mmol/L (ref 135–145)

## 2015-07-02 LAB — CBC
HCT: 26.3 % — ABNORMAL LOW (ref 39.0–52.0)
Hemoglobin: 8.4 g/dL — ABNORMAL LOW (ref 13.0–17.0)
MCH: 27.4 pg (ref 26.0–34.0)
MCHC: 31.9 g/dL (ref 30.0–36.0)
MCV: 85.7 fL (ref 78.0–100.0)
Platelets: 262 10*3/uL (ref 150–400)
RBC: 3.07 MIL/uL — ABNORMAL LOW (ref 4.22–5.81)
RDW: 16 % — ABNORMAL HIGH (ref 11.5–15.5)
WBC: 7.4 10*3/uL (ref 4.0–10.5)

## 2015-07-02 LAB — PROTIME-INR
INR: 2.66 — ABNORMAL HIGH (ref 0.00–1.49)
Prothrombin Time: 27.9 seconds — ABNORMAL HIGH (ref 11.6–15.2)

## 2015-07-02 LAB — MAGNESIUM: Magnesium: 1.7 mg/dL (ref 1.7–2.4)

## 2015-07-02 LAB — GLUCOSE, CAPILLARY
Glucose-Capillary: 87 mg/dL (ref 65–99)
Glucose-Capillary: 111 mg/dL — ABNORMAL HIGH (ref 65–99)
Glucose-Capillary: 132 mg/dL — ABNORMAL HIGH (ref 65–99)
Glucose-Capillary: 107 mg/dL — ABNORMAL HIGH (ref 65–99)

## 2015-07-02 MED ORDER — DARBEPOETIN ALFA 150 MCG/0.3ML IJ SOSY
150.0000 ug | PREFILLED_SYRINGE | INTRAMUSCULAR | Status: DC
  Administered 2015-07-02: 150 ug via SUBCUTANEOUS
  Filled 2015-07-02 (×2): qty 0.3

## 2015-07-02 MED ORDER — WARFARIN SODIUM 5 MG PO TABS
5.0000 mg | ORAL_TABLET | Freq: Once | ORAL | Status: AC
  Administered 2015-07-02: 5 mg via ORAL
  Filled 2015-07-02: qty 1

## 2015-07-02 MED ORDER — NEPRO/CARBSTEADY PO LIQD
237.0000 mL | Freq: Two times a day (BID) | ORAL | Status: DC
  Administered 2015-07-02 – 2015-07-05 (×6): 237 mL via ORAL
  Filled 2015-07-02 (×14): qty 237

## 2015-07-02 MED ORDER — ENSURE ENLIVE PO LIQD: 237.0000 mL | Freq: Three times a day (TID) | ORAL | Status: DC

## 2015-07-02 MED ORDER — DOCUSATE SODIUM 100 MG PO CAPS
100.0000 mg | ORAL_CAPSULE | Freq: Two times a day (BID) | ORAL | Status: DC
  Administered 2015-07-02 – 2015-07-07 (×7): 100 mg via ORAL
  Filled 2015-07-02 (×10): qty 1

## 2015-07-02 NOTE — Progress Notes (Signed)
[facility] ROUNDING NOTE   Subjective:   Interval History: no nausea no shortness of breath - feels well with no confusion  Urine output good  Objective:  Vital signs in last 24 hours:  Temp:  [97.6 F (36.4 C)-98.2 F (36.8 C)] 97.8 F (36.6 C) (03/01 0422) Pulse Rate:  [89-124] 89 (03/01 0422) Resp:  [16-18] 16 (03/01 0422) BP: (147-183)/(75-90) 154/82 mmHg (03/01 0422) SpO2:  [95 %-96 %] 96 % (03/01 0422) Weight:  [86 kg (189 lb 9.5 oz)] 86 kg (189 lb 9.5 oz) (03/01 0422)  Weight change: -0.4 kg (-14.1 oz) Filed Weights   06/30/15 0413 07/01/15 0426 07/02/15 0422  Weight: 86.7 kg (191 lb 2.2 oz) 86.4 kg (190 lb 7.6 oz) 86 kg (189 lb 9.5 oz)    Intake/Output: I/O last 3 completed shifts: In: 720 [P.O.:720] Out: 1500 [Urine:1500]   Intake/Output this shift:     CVS-  Irregular irregular  Atrial fibrillation RS- CTA ABD- BS present soft non-distended EXT- no edema   Basic Metabolic Panel:  Recent Labs Lab 06/28/15 0435 06/29/15 0404 06/30/15 0241 07/01/15 0420 07/02/15 0307  NA 136 137 136 139 138  K 3.9 3.9 3.9 4.6 4.3  CL 99* 101 100* 100* 101  CO2 23 23 20* 22 19*  GLUCOSE 98 108* 169* 89 87  BUN 45* 62* 75* 89* 102*  CREATININE 5.25* 7.37* 8.77* 10.45* 11.48*  CALCIUM 8.4* 8.6* 8.5* 8.6* 8.4*  MG 1.7 1.7 1.6* 1.8 1.7  PHOS 3.7 5.2* 6.8* 9.6* 10.6*    Liver Function Tests:  Recent Labs Lab 06/28/15 0435 06/29/15 0404 06/30/15 0241 07/01/15 0420 07/02/15 0307  ALBUMIN 1.5* 1.5* 1.6* 1.7* 1.6*   No results for input(s): LIPASE, AMYLASE in the last 168 hours. No results for input(s): AMMONIA in the last 168 hours.  CBC:  Recent Labs Lab 06/28/15 0435 06/29/15 0404 06/30/15 0241 07/01/15 0420 07/02/15 0307  WBC 8.7 9.3 9.2 8.4 7.4  HGB 9.0* 8.6* 8.7* 8.7* 8.4*  HCT 28.0* 26.8* 27.5* 26.9* 26.3*  MCV 84.8 85.4 85.9 85.7 85.7  PLT 264 263 269 262 262    Cardiac Enzymes: No results for input(s): CKTOTAL, CKMB,  CKMBINDEX, TROPONINI in the last 168 hours.  BNP: Invalid input(s): POCBNP  CBG:  Recent Labs Lab 07/01/15 0749 07/01/15 1122 07/01/15 1631 07/01/15 2013 07/02/15 0557  GLUCAP 112* 109* 100* 126* 87    Microbiology: Results for orders placed or performed during the hospital encounter of 06/09/15  Culture, respiratory (NON-Expectorated)     Status: None   Collection Time: 06/13/15 10:08 AM  Result Value Ref Range Status   Specimen Description ENDOTRACHEAL  Final   Special Requests NONE  Final   Gram Stain   Final    ABUNDANT WBC PRESENT,BOTH PMN AND MONONUCLEAR FEW SQUAMOUS EPITHELIAL CELLS PRESENT ABUNDANT GRAM NEGATIVE RODS FEW GRAM POSITIVE COCCI IN PAIRS Performed at [facility]    Culture   Final    ABUNDANT SERRATIA MARCESCENS Performed at [facility]    Report Status 06/15/2015 FINAL  Final   Organism ID, Bacteria SERRATIA MARCESCENS  Final      Susceptibility   Serratia marcescens - MIC*    CEFAZOLIN >=64 RESISTANT Resistant     CEFEPIME <=1 SENSITIVE Sensitive     CEFTAZIDIME <=1 SENSITIVE Sensitive     CEFTRIAXONE <=1 SENSITIVE Sensitive     CIPROFLOXACIN <=0.25 SENSITIVE Sensitive     GENTAMICIN <=1 SENSITIVE Sensitive  TOBRAMYCIN <=1 SENSITIVE Sensitive     TRIMETH/SULFA Value in next row Sensitive      <=20 SENSITIVE(NOTE)    * ABUNDANT SERRATIA MARCESCENS  Culture, blood (routine x 2)     Status: None   Collection Time: 06/20/15 11:57 AM  Result Value Ref Range Status   Specimen Description BLOOD RIGHT ANTECUBITAL  Final   Special Requests BOTTLES DRAWN AEROBIC AND ANAEROBIC 10CCS  Final   Culture NO GROWTH 5 DAYS  Final   Report Status 06/25/2015 FINAL  Final  Culture, blood (single) w Reflex to ID Panel     Status: None   Collection Time: 06/20/15  1:25 PM  Result Value Ref Range Status   Specimen Description BLOOD CONFIRMED BY CLINITEST AS NEGATIVE  Final   Special Requests   Final    BOTTLES DRAWN AEROBIC AND  ANAEROBIC  5CCS RIGHT IJ LINE   Culture NO GROWTH 5 DAYS  Final   Report Status 06/25/2015 FINAL  Final  Culture, blood (routine x 2)     Status: None   Collection Time: 06/20/15  1:25 PM  Result Value Ref Range Status   Specimen Description BLOOD CONFIRMED BY CLINITEST AS NEGATIVE  Final   Special Requests   Final    BOTTLES DRAWN AEROBIC AND ANAEROBIC 5CCS LEFT IJ LINE   Culture NO GROWTH 5 DAYS  Final   Report Status 06/25/2015 FINAL  Final    Coagulation Studies:  Recent Labs  06/30/15 0241 07/01/15 0420 07/02/15 0307  LABPROT 22.0* 32.6* 27.9*  INR 1.93* 3.27* 2.66*    Urinalysis: No results for input(s): COLORURINE, LABSPEC, PHURINE, GLUCOSEU, HGBUR, BILIRUBINUR, KETONESUR, PROTEINUR, UROBILINOGEN, NITRITE, LEUKOCYTESUR in the last 72 hours.  Invalid input(s): APPERANCEUR    Imaging: No results found.   Medications:   . sodium chloride 20 mL/hr at 06/24/15 2300  . sodium chloride     . alteplase  2 mg Intracatheter Once  . antiseptic oral rinse  7 mL Mouth Rinse q12n4p  . atorvastatin  20 mg Oral QHS  . chlorhexidine  15 mL Mouth Rinse BID  . darbepoetin (ARANESP) injection - NON-DIALYSIS  150 mcg Subcutaneous Q Mon-1800  . docusate sodium  100 mg Oral BID  . feeding supplement (ENSURE ENLIVE)  237 mL Oral TID WC  . insulin aspart  0-9 Units Subcutaneous TID WC  . labetalol  200 mg Oral BID  . sodium chloride flush  10-40 mL Intracatheter Q12H  . tamsulosin  0.4 mg Oral QPC supper  . Warfarin - Pharmacist Dosing Inpatient   Does not apply q1800   acetaminophen **OR** acetaminophen, bisacodyl, fentaNYL (SUBLIMAZE) injection, heparin, hydrALAZINE, ipratropium, levalbuterol, oxyCODONE-acetaminophen, sodium chloride flush, zolpidem  Assessment/ Plan:  1. A on CRF- crt of 1.27 on 2/2- AKI in the setting of a chronically nonfunctional right kdney and hemodynamic changes associated with AAA repair and left renal artery bypass on 2/6. required CRRT 2/9- 2/13-  then IHD 2/15 , 2/17 2/20 , 2/22 and 2/24- averaging about 500-600 of urine q day but had not been able to pass - s/p foley so we can keep track but also to spare him the repeated in and out caths Will hold off on dialysis  2. HTN/vol- Improved Change metoprolol to labetalol 3. Anemia- due to surgery and CKD- - started aranesp and transfuse as needed- replete iron as well 4. S./p AAA repair and renal artery bypass - per VVS 5. Bones- phos down with HD only- PTH 92 6. Hypokalemia-  Stable 7. GI issues- requiring TNA but now weaned off- albumin quite poor 8. BOO- several days of not being able to void- on flomax and placed foley on 2/25  Good urine output today and continues to do well . Hopefully creatinine should improve     LOS: 23 Brallan Denio W @TODAY @8 :59 AM

## 2015-07-02 NOTE — Progress Notes (Addendum)
  AAA Progress Note    07/02/2015 8:29 AM 6 Days Post-Op  Subjective:  Feels weak, but overall no complaints  Afebrile HR 80's-100's Afib [id number] systolic 0000000 RA  Filed Vitals:   07/01/15 2015 07/02/15 0422  BP: 152/75 154/82  Pulse: 89 89  Temp: 97.6 F (36.4 C) 97.8 F (36.6 C)  Resp: 16 16    Physical Exam: Cardiac:  irregular Lungs:  CTAB Abdomen:  Soft, NT/ND; +BS; +BM Incisions:  C/d/i Extremities:  Palpable left PT and palpable right DP  CBC    Component Value Date/Time   WBC 7.4 07/02/2015 0307   RBC 3.07* 07/02/2015 0307   HGB 8.4* 07/02/2015 0307   HCT 26.3* 07/02/2015 0307   PLT 262 07/02/2015 0307   MCV 85.7 07/02/2015 0307   MCH 27.4 07/02/2015 0307   MCHC 31.9 07/02/2015 0307   RDW 16.0* 07/02/2015 0307   LYMPHSABS 0.9 06/23/2015 0250   MONOABS 1.2* 06/23/2015 0250   EOSABS 0.1 06/23/2015 0250   BASOSABS 0.0 06/23/2015 0250    BMET    Component Value Date/Time   NA 138 07/02/2015 0307   K 4.3 07/02/2015 0307   CL 101 07/02/2015 0307   CO2 19* 07/02/2015 0307   GLUCOSE 87 07/02/2015 0307   BUN 102* 07/02/2015 0307   CREATININE 11.48* 07/02/2015 0307   CREATININE 1.10 11/07/2014 0804   CALCIUM 8.4* 07/02/2015 0307   GFRNONAA 4* 07/02/2015 0307   GFRNONAA 53* 05/14/2013 0905   GFRAA 5* 07/02/2015 0307   GFRAA 62 05/14/2013 0905    INR    Component Value Date/Time   INR 2.66* 07/02/2015 0307   INR 3.6 03/10/2015 0828   INR 1.9 07/20/2010 0817     Intake/Output Summary (Last 24 hours) at 07/02/15 0829 Last data filed at 07/02/15 0422  Gross per 24 hour  Intake    720 ml  Output   1200 ml  Net   -480 ml     Assessment/Plan:  65 y.o. male is s/p  repair of juxtarenal AAA, left renal bypass, pancreatitis, AKI 6 Days Post-Op  -pt doing well this morning with palpable pedal pulses -HD per renal-BUN/Cr up from yesterday.  Foley per renal-would like to d/c prior to discharge -still with low albumin-will get nutrition  consult and start Ensure/Boost -BM this morning that was hard and bright red blood present per pt-continue to monitor since he is on coumadin.  Will start stool softener. -continue mobilizing -staples out yesterday and incision looks good. -INR is therapeutic at 2.66.  Continue coumadin per pharmacy for Afib.   Leontine Locket, PA-C [facility] [phone] 07/02/2015 8:29 AM  Agree with above.  Slow progress.  Hopefully d/c home on 07/04/15  Ruta Hinds, MD [facility] [place]: [phone] Pager: [phone]

## 2015-07-02 NOTE — Progress Notes (Signed)
Nutrition Consult/Follow Up  DOCUMENTATION CODES:   Not applicable  INTERVENTION:   Nepro Shake po BID, each supplement provides 425 kcal and 19 grams protein  NUTRITION DIAGNOSIS:   Increased nutrient needs related to HD as evidenced by estimated nutrition needs, ongoing  GOAL:   Patient will meet greater than or equal to 90% of their needs, progressing  MONITOR:   PO intake, Supplement acceptance, Labs, Weight trends, I & O's  ASSESSMENT:   65 yo Male with PMH as below, which is significant HTN, Atrial Fib on warfarin, CVA, and known AAA. He was recently admitted to [facility] ED 1/20 with complaints of abdominal pain. CT abdomen at that time showed evidence of hypoperfusion to the R kidney. CT angio showed some enlargement of the seat of an aneurysm. 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 It was on that day that he presented, and underwent the procedures. Estimated 4L blood loss during case. Post operatively he remains on vent in surgical ICU.   Patient s/p procedure 2/6: REPAIR OF JUXTARENAL ABDOMINAL AORTIC ANEURYSM, LEFT RENAL BYPASS  RD consulted for low albumin/oral nutrition supplements.  TPN discontinued 2/22. PO intake variable at 25-100% per flowsheet records. Ensure Enlive ordered.  Will change to Nepro Shake as pt on HD.  Albumin has a half-life of 21 days and is strongly affected by stress response and inflammatory process, therefore, do not expect to see an improvement in this lab value during acute hospitalization.  Diet Order:  Diet renal  with fluid restriction Fluid restriction:: 1200 mL Fluid; Room service appropriate?: Yes; Fluid consistency:: Thin  Skin:  Reviewed, no issues  Last BM:  2/27  Height:   Ht Readings from Last 1 Encounters:  06/09/15 6' (1.829 m)    Weight:   Wt Readings from Last 1 Encounters:  07/02/15 189 lb 9.5 oz (86 kg)    Ideal Body Weight:  81 kg  BMI:  Body mass index is 25.71 kg/(m^2).  Estimated Nutritional Needs:   Kcal:  2100-2300  Protein:  135-145 gm  Fluid:  per MD  EDUCATION NEEDS:   No education needs identified at this time  Arthur Holms, RD, LDN Pager #: [phone] After-Hours Pager #: [phone]

## 2015-07-02 NOTE — Progress Notes (Signed)
ANTICOAGULATION CONSULT NOTE - Follow Up Consult  Pharmacy Consult for Coumadin Indication: atrial fibrillation and stroke  No Known Allergies  Patient Measurements: Height: 6' (182.9 cm) Weight: 189 lb 9.5 oz (86 kg) IBW/kg (Calculated) : 77.6  Vital Signs: Temp: 97.8 F (36.6 C) (03/01 0422) Temp Source: Oral (03/01 0422) BP: 176/93 mmHg (03/01 1012) Pulse Rate: 96 (03/01 1012)  Labs:  Recent Labs  06/30/15 0241 07/01/15 0420 07/02/15 0307  HGB 8.7* 8.7* 8.4*  HCT 27.5* 26.9* 26.3*  PLT 269 262 262  LABPROT 22.0* 32.6* 27.9*  INR 1.93* 3.27* 2.66*  CREATININE 8.77* 10.45* 11.48*    Estimated Creatinine Clearance: 7.3 mL/min (by C-G formula based on Cr of 11.48).  Assessment: 64yom on coumadin pta for afib and hx stroke. He underwent elective repair of his AAA along with left renal artery bypass on 2/6. Post-op course complicated by renal failure, first requiring CRRT, now on iHD. He had an HD tunneled catheter placed 2/23. Coumadin resumed 2/24. INR with significant jump yesterday to  3.27, thus coumadin held yesterday. Today the INR has lowered to therapeutic range, INR = 2.66 today.Hgb low but stable. PLTC wnl/stable. No bleeding except patient reported BM this morning that was hard and bright red blood present. Vascular surgery  PA aware and noted to continue to monitor since he is on coumadin & started a stool softener.   Home dose: 5mg  daily except 7.5mg  on Wed/Thurs . Last taken PTA on 06/04/15 as was held for procedure. Admit date on 06/09/15.  Goal of Therapy:  INR 2-3 Monitor platelets by anticoagulation protocol: Yes   Plan:  Coumadin 5mg  tonight INR in AM  Nicole Cella, RPh Clinical Pharmacist Pager: [phone] 07/02/2015,1:03 PM

## 2015-07-03 LAB — RENAL FUNCTION PANEL
Albumin: 1.7 g/dL — ABNORMAL LOW (ref 3.5–5.0)
Anion gap: 18 — ABNORMAL HIGH (ref 5–15)
BUN: 112 mg/dL — ABNORMAL HIGH (ref 6–20)
CO2: 19 mmol/L — ABNORMAL LOW (ref 22–32)
Calcium: 8.3 mg/dL — ABNORMAL LOW (ref 8.9–10.3)
Chloride: 102 mmol/L (ref 101–111)
Creatinine, Ser: 12.08 mg/dL — ABNORMAL HIGH (ref 0.61–1.24)
GFR calc Af Amer: 4 mL/min — ABNORMAL LOW (ref >60)
GFR calc non Af Amer: 4 mL/min — ABNORMAL LOW (ref >60)
Glucose, Bld: 121 mg/dL — ABNORMAL HIGH (ref 65–99)
Phosphorus: 10.6 mg/dL — ABNORMAL HIGH (ref 2.5–4.6)
Potassium: 4.4 mmol/L (ref 3.5–5.1)
Sodium: 139 mmol/L (ref 135–145)

## 2015-07-03 LAB — CBC
HCT: 25.9 % — ABNORMAL LOW (ref 39.0–52.0)
Hemoglobin: 8.3 g/dL — ABNORMAL LOW (ref 13.0–17.0)
MCH: 27.6 pg (ref 26.0–34.0)
MCHC: 32 g/dL (ref 30.0–36.0)
MCV: 86 fL (ref 78.0–100.0)
Platelets: 249 10*3/uL (ref 150–400)
RBC: 3.01 MIL/uL — ABNORMAL LOW (ref 4.22–5.81)
RDW: 15.9 % — ABNORMAL HIGH (ref 11.5–15.5)
WBC: 7.7 10*3/uL (ref 4.0–10.5)

## 2015-07-03 LAB — GLUCOSE, CAPILLARY
Glucose-Capillary: 90 mg/dL (ref 65–99)
Glucose-Capillary: 135 mg/dL — ABNORMAL HIGH (ref 65–99)
Glucose-Capillary: 120 mg/dL — ABNORMAL HIGH (ref 65–99)
Glucose-Capillary: 101 mg/dL — ABNORMAL HIGH (ref 65–99)

## 2015-07-03 LAB — PROTIME-INR
INR: 3.28 — ABNORMAL HIGH (ref 0.00–1.49)
Prothrombin Time: 32.8 seconds — ABNORMAL HIGH (ref 11.6–15.2)

## 2015-07-03 LAB — MAGNESIUM: Magnesium: 1.7 mg/dL (ref 1.7–2.4)

## 2015-07-03 NOTE — Progress Notes (Addendum)
  Progress Note    07/03/2015 7:48 AM 7 Days Post-Op  Subjective:  No complaints-sitting up eating breakfast  Afebrile HR 90's-100's Afib 123XX123 - 123XX123 systolic 0000000 RA  Filed Vitals:   07/03/15 0017 07/03/15 0428  BP: 145/85 148/77  Pulse: 99 92  Temp:  98.1 F (36.7 C)  Resp:  18    Physical Exam: NAD sitting up eating breakfast  CBC    Component Value Date/Time   WBC 7.7 07/03/2015 0400   RBC 3.01* 07/03/2015 0400   HGB 8.3* 07/03/2015 0400   HCT 25.9* 07/03/2015 0400   PLT 249 07/03/2015 0400   MCV 86.0 07/03/2015 0400   MCH 27.6 07/03/2015 0400   MCHC 32.0 07/03/2015 0400   RDW 15.9* 07/03/2015 0400   LYMPHSABS 0.9 06/23/2015 0250   MONOABS 1.2* 06/23/2015 0250   EOSABS 0.1 06/23/2015 0250   BASOSABS 0.0 06/23/2015 0250    BMET    Component Value Date/Time   NA 139 07/03/2015 0400   K 4.4 07/03/2015 0400   CL 102 07/03/2015 0400   CO2 19* 07/03/2015 0400   GLUCOSE 121* 07/03/2015 0400   BUN 112* 07/03/2015 0400   CREATININE 12.08* 07/03/2015 0400   CREATININE 1.10 11/07/2014 0804   CALCIUM 8.3* 07/03/2015 0400   GFRNONAA 4* 07/03/2015 0400   GFRNONAA 53* 05/14/2013 0905   GFRAA 4* 07/03/2015 0400   GFRAA 62 05/14/2013 0905    INR    Component Value Date/Time   INR 3.28* 07/03/2015 0400   INR 3.6 03/10/2015 0828   INR 1.9 07/20/2010 0817     Intake/Output Summary (Last 24 hours) at 07/03/15 0748 Last data filed at 07/03/15 0428  Gross per 24 hour  Intake    500 ml  Output   1575 ml  Net  -1075 ml     Assessment:  65 y.o. male is s/p:  repair of juxtarenal AAA, left renal bypass, pancreatitis, AKI  7 Days Post-Op  Plan: -pt doing well this morning -renal failure per nephrology.  BUN/Cr slightly worse this morning.  Did not received HD yesterday.  Had >1500cc/24hr UOP. -DVT prophylaxis:  Coumadin.  INR 3.28 this morning after coumadin 5mg  last night, which is up from 2.66.  Management per pharmacy. -possibly home tomorrow depending  on nephrology's thoughts   Leontine Locket, [facility] [phone] 07/03/2015 7:48 AM  Agree with above.  Need to arrange disposition with Nephrology.  Otherwise ready for d/c tomorrow  Ruta Hinds, MD [facility] [place] Office: [phone] Pager: [phone]

## 2015-07-03 NOTE — Progress Notes (Signed)
[facility] ROUNDING NOTE   Subjective:   Interval History: rate of rise creatinine decreased  Objective:  Vital signs in last 24 hours:  Temp:  [97.6 F (36.4 C)-98.2 F (36.8 C)] 98.1 F (36.7 C) (03/02 0428) Pulse Rate:  [90-100] 92 (03/02 0428) Resp:  [18] 18 (03/02 0428) BP: (134-176)/(75-93) 148/77 mmHg (03/02 0428) SpO2:  [97 %-98 %] 97 % (03/02 0428) Weight:  [84.7 kg (186 lb 11.7 oz)] 84.7 kg (186 lb 11.7 oz) (03/02 0428)  Weight change: -1.3 kg (-2 lb 13.9 oz) Filed Weights   07/01/15 0426 07/02/15 0422 07/03/15 0428  Weight: 86.4 kg (190 lb 7.6 oz) 86 kg (189 lb 9.5 oz) 84.7 kg (186 lb 11.7 oz)    Intake/Output: I/O last 3 completed shifts: In: 500 [P.O.:500] Out: 1975 [Urine:1975]   Intake/Output this shift:     CVS- RRR RS- CTA ABD- BS present soft non-distended EXT- no edema   Basic Metabolic Panel:  Recent Labs Lab 06/29/15 0404 06/30/15 0241 07/01/15 0420 07/02/15 0307 07/03/15 0400  NA 137 136 139 138 139  K 3.9 3.9 4.6 4.3 4.4  CL 101 100* 100* 101 102  CO2 23 20* 22 19* 19*  GLUCOSE 108* 169* 89 87 121*  BUN 62* 75* 89* 102* 112*  CREATININE 7.37* 8.77* 10.45* 11.48* 12.08*  CALCIUM 8.6* 8.5* 8.6* 8.4* 8.3*  MG 1.7 1.6* 1.8 1.7 1.7  PHOS 5.2* 6.8* 9.6* 10.6* 10.6*    Liver Function Tests:  Recent Labs Lab 06/29/15 0404 06/30/15 0241 07/01/15 0420 07/02/15 0307 07/03/15 0400  ALBUMIN 1.5* 1.6* 1.7* 1.6* 1.7*   No results for input(s): LIPASE, AMYLASE in the last 168 hours. No results for input(s): AMMONIA in the last 168 hours.  CBC:  Recent Labs Lab 06/29/15 0404 06/30/15 0241 07/01/15 0420 07/02/15 0307 07/03/15 0400  WBC 9.3 9.2 8.4 7.4 7.7  HGB 8.6* 8.7* 8.7* 8.4* 8.3*  HCT 26.8* 27.5* 26.9* 26.3* 25.9*  MCV 85.4 85.9 85.7 85.7 86.0  PLT 263 269 262 262 249    Cardiac Enzymes: No results for input(s): CKTOTAL, CKMB, CKMBINDEX, TROPONINI in the last 168 hours.  BNP: Invalid input(s):  POCBNP  CBG:  Recent Labs Lab 07/02/15 0557 07/02/15 1144 07/02/15 1620 07/02/15 2032 07/03/15 0644  GLUCAP 87 111* 132* 107* 60    Microbiology: Results for orders placed or performed during the hospital encounter of 06/09/15  Culture, respiratory (NON-Expectorated)     Status: None   Collection Time: 06/13/15 10:08 AM  Result Value Ref Range Status   Specimen Description ENDOTRACHEAL  Final   Special Requests NONE  Final   Gram Stain   Final    ABUNDANT WBC PRESENT,BOTH PMN AND MONONUCLEAR FEW SQUAMOUS EPITHELIAL CELLS PRESENT ABUNDANT GRAM NEGATIVE RODS FEW GRAM POSITIVE COCCI IN PAIRS Performed at [facility]    Culture   Final    ABUNDANT SERRATIA MARCESCENS Performed at [facility]    Report Status 06/15/2015 FINAL  Final   Organism ID, Bacteria SERRATIA MARCESCENS  Final      Susceptibility   Serratia marcescens - MIC*    CEFAZOLIN >=64 RESISTANT Resistant     CEFEPIME <=1 SENSITIVE Sensitive     CEFTAZIDIME <=1 SENSITIVE Sensitive     CEFTRIAXONE <=1 SENSITIVE Sensitive     CIPROFLOXACIN <=0.25 SENSITIVE Sensitive     GENTAMICIN <=1 SENSITIVE Sensitive     TOBRAMYCIN <=1 SENSITIVE Sensitive     TRIMETH/SULFA Value in next row Sensitive      <=  20 SENSITIVE(NOTE)    * ABUNDANT SERRATIA MARCESCENS  Culture, blood (routine x 2)     Status: None   Collection Time: 06/20/15 11:57 AM  Result Value Ref Range Status   Specimen Description BLOOD RIGHT ANTECUBITAL  Final   Special Requests BOTTLES DRAWN AEROBIC AND ANAEROBIC 10CCS  Final   Culture NO GROWTH 5 DAYS  Final   Report Status 06/25/2015 FINAL  Final  Culture, blood (single) w Reflex to ID Panel     Status: None   Collection Time: 06/20/15  1:25 PM  Result Value Ref Range Status   Specimen Description BLOOD CONFIRMED BY CLINITEST AS NEGATIVE  Final   Special Requests   Final    BOTTLES DRAWN AEROBIC AND ANAEROBIC  5CCS RIGHT IJ LINE   Culture NO GROWTH 5 DAYS  Final   Report Status  06/25/2015 FINAL  Final  Culture, blood (routine x 2)     Status: None   Collection Time: 06/20/15  1:25 PM  Result Value Ref Range Status   Specimen Description BLOOD CONFIRMED BY CLINITEST AS NEGATIVE  Final   Special Requests   Final    BOTTLES DRAWN AEROBIC AND ANAEROBIC 5CCS LEFT IJ LINE   Culture NO GROWTH 5 DAYS  Final   Report Status 06/25/2015 FINAL  Final    Coagulation Studies:  Recent Labs  07/01/15 0420 07/02/15 0307 07/03/15 0400  LABPROT 32.6* 27.9* 32.8*  INR 3.27* 2.66* 3.28*    Urinalysis: No results for input(s): COLORURINE, LABSPEC, PHURINE, GLUCOSEU, HGBUR, BILIRUBINUR, KETONESUR, PROTEINUR, UROBILINOGEN, NITRITE, LEUKOCYTESUR in the last 72 hours.  Invalid input(s): APPERANCEUR    Imaging: No results found.   Medications:   . sodium chloride 20 mL/hr at 06/24/15 2300  . sodium chloride     . alteplase  2 mg Intracatheter Once  . antiseptic oral rinse  7 mL Mouth Rinse q12n4p  . atorvastatin  20 mg Oral QHS  . chlorhexidine  15 mL Mouth Rinse BID  . darbepoetin (ARANESP) injection - NON-DIALYSIS  150 mcg Subcutaneous Q Wed-1800  . docusate sodium  100 mg Oral BID  . feeding supplement (NEPRO CARB STEADY)  237 mL Oral BID BM  . insulin aspart  0-9 Units Subcutaneous TID WC  . labetalol  200 mg Oral BID  . sodium chloride flush  10-40 mL Intracatheter Q12H  . tamsulosin  0.4 mg Oral QPC supper  . Warfarin - Pharmacist Dosing Inpatient   Does not apply q1800   acetaminophen **OR** acetaminophen, bisacodyl, fentaNYL (SUBLIMAZE) injection, heparin, hydrALAZINE, ipratropium, levalbuterol, oxyCODONE-acetaminophen, sodium chloride flush, zolpidem  Assessment/ Plan:  1. A on CRF- crt of 1.27 on 2/2- AKI in the setting of a chronically nonfunctional right kdney and hemodynamic changes associated with AAA repair and left renal artery bypass on 2/6. required CRRT 2/9- 2/13- then IHD 2/15 , 2/17 2/20 , 2/22 and 2/24- averaging about 500-600 of urine q  day but had not been able to pass - s/p foley so we can keep track but also to spare him the repeated in and out caths Will hold off on dialysis  2. HTN/vol- Improved Changed metoprolol to labetalol 3. Anemia- due to surgery and CKD- - started aranesp and transfuse as needed- replete iron as well 4. S./p AAA repair and renal artery bypass - per VVS 5. Bones- phos down with HD only- PTH 92 6. Hypokalemia- Stable 7. GI issues- requiring TNA but now weaned off- albumin quite poor 8. BOO- several days of  not being able to void- on flomax and placed foley on 2/25  Good urine output today and continues to do well . Hopefully creatinine should improve. Rate of rise decrease    LOS: 24 Tavio Biegel W @TODAY @9 :07 AM

## 2015-07-03 NOTE — Progress Notes (Signed)
ANTICOAGULATION CONSULT NOTE - Follow Up Consult  Pharmacy Consult for Coumadin Indication: atrial fibrillation and stroke  No Known Allergies  Patient Measurements: Height: 6' (182.9 cm) Weight: 186 lb 11.7 oz (84.7 kg) IBW/kg (Calculated) : 77.6  Vital Signs: Temp: 98.1 F (36.7 C) (03/02 0428) Temp Source: Oral (03/02 0428) BP: 167/90 mmHg (03/02 1027) Pulse Rate: 88 (03/02 1027)  Labs:  Recent Labs  07/01/15 0420 07/02/15 0307 07/03/15 0400  HGB 8.7* 8.4* 8.3*  HCT 26.9* 26.3* 25.9*  PLT 262 262 249  LABPROT 32.6* 27.9* 32.8*  INR 3.27* 2.66* 3.28*  CREATININE 10.45* 11.48* 12.08*    Estimated Creatinine Clearance: 7 mL/min (by C-G formula based on Cr of 12.08).  Assessment: 64yom on coumadin pta for afib and hx stroke. He underwent elective repair of his AAA along with left renal artery bypass on 2/6. Post-op course complicated by renal failure, first requiring CRRT, now on iHD. He had an HD tunneled catheter placed 2/23.  - Coumadin resumed on 06/27/15.  INR 1.93 > 3.27>2.66>3.28 today.  Received coumadin 7.5mg , 7.5mg , 7.5mg , 5mg , none, 5mg  over last 6 days. Hgb 8.3 low but stable. PLTC wnl/stable. No bleeding noted. Meals eaten have been low 25-50% of meals on 2/27-2/28. Improved today 100% ?breakfast or lunch.   Home dose: 5mg  daily except 7.5mg  on Wed/Thurs . Last taken PTA on 06/04/15 as was held for procedure. Admit date on 06/09/15.  Goal of Therapy:  INR 2-3 Monitor platelets by anticoagulation protocol: Yes   Plan:  Hold Coumadin dose tonight INR in AM  Nicole Cella, RPh Clinical Pharmacist Pager: [phone] 07/03/2015,1:18 PM

## 2015-07-03 NOTE — Progress Notes (Signed)
Physical Therapy Treatment Patient Details Name: [name] [name] MRN: [id number] DOB: 11/04/1950 Today's Date: 07/03/2015    History of Present Illness 65 yo Male with PMH significant for HTN, Atrial Fib on warfarin, CVA, and known AAA.   Underwent arterial aortogram with lower extremity 1/25 which discovered occluded R renal artery moderate calcific stenosis and bilateral iliac arteries right greater than left, and diseased suprarenal aorta. He was admitted on 06/09/15 for open repair of infrarenal aorta with L renal artery bypass and possible R renal artery bypass.    PT Comments    Pt will require RW at d/c as fatigue remains an issue with PT interventions.  Informed supervising PT.    Follow Up Recommendations  Supervision/Assistance - 24 hour;Home health PT     Equipment Recommendations  Rolling walker with 5" wheels    Recommendations for Other Services       Precautions / Restrictions Precautions Precautions: Fall Precaution Comments: CVVHD transitioned to HD on 06/17/15 Restrictions Weight Bearing Restrictions: No    Mobility  Bed Mobility Overal bed mobility: Modified Independent Bed Mobility: Supine to Sit;Sit to Supine     Supine to sit: Modified independent (Device/Increase time) Sit to supine: Modified independent (Device/Increase time)   General bed mobility comments: used rail.    Transfers Overall transfer level: Needs assistance Equipment used: None (did not use RW, pt remains to fatigue without RW would benefit from RW at d/c.  ) Transfers: Sit to/from Stand Sit to Stand: Supervision Stand pivot transfers: Supervision       General transfer comment: from EOB and regular height toilet with grab bar. min guard for safety.  Ambulation/Gait Ambulation/Gait assistance: Min guard Ambulation Distance (Feet): 280 Feet Assistive device: None (Pt with noticeable DOE requiring standing rest breaks x 4.  Educated pt on RW use to improve energy conservation and  decrease pain in low back.  ) Gait Pattern/deviations: Step-through pattern;Decreased step length - right;Decreased step length - left;Decreased weight shift to right;Decreased weight shift to left     General Gait Details: Decreased reciprocal armswing.  pt fatigue quickly, no RW used during intervention to challenge balance but RW remains needed for safety without PT intervention.  Pt educated to use RW if walking with staff.  Pt required cues to increase step length and reciprocal armswing.     Stairs     Stair Management: One rail Right Number of Stairs: 12 (6x2 trials.  ) General stair comments: Pt required cues for hand placement and sequencing.  Pt required cues for forward negotiation to ascend demo's LOB forward required mod assist to correct, attempted additional trial with sidestepping and increased safety noted with decreased assist.    Wheelchair Mobility    Modified Rankin (Stroke Patients Only)       Balance Overall balance assessment: Needs assistance Sitting-balance support: No upper extremity supported;Feet supported Sitting balance-Leahy Scale: Good     Standing balance support: No upper extremity supported;Bilateral upper extremity supported;During functional activity Standing balance-Leahy Scale: Fair Standing balance comment: can stand without UE support, but needs walker for dynamic activities                    Cognition Arousal/Alertness: Awake/alert Behavior During Therapy: WFL for tasks assessed/performed Overall Cognitive Status: Impaired/Different from baseline Area of Impairment: Safety/judgement   Current Attention Level: Sustained Memory: Decreased short-term memory Following Commands: Follows one step commands consistently Safety/Judgement: Decreased awareness of safety  Exercises      General Comments        Pertinent Vitals/Pain Pain Assessment: Faces Faces Pain Scale: Hurts little more Pain Location: low back     Home Living                      Prior Function            PT Goals (current goals can now be found in the care plan section) Acute Rehab PT Goals Patient Stated Goal: go home Potential to Achieve Goals: Good Progress towards PT goals: Progressing toward goals    Frequency  Min 3X/week    PT Plan Discharge plan needs to be updated    Co-evaluation             End of Session Equipment Utilized During Treatment: Gait belt Activity Tolerance: Patient tolerated treatment well Patient left: in bed;with call bell/phone within reach;with family/visitor present     Time: 1550-1601 PT Time Calculation (min) (ACUTE ONLY): 11 min  Charges:  $Gait Training: 8-22 mins                    G Codes:      Cristela Blue 07-30-15, 4:21 PM  Governor Rooks, PTA pager [phone]

## 2015-07-03 NOTE — Consult Note (Addendum)
   [facility] CM Inpatient Consult   07/03/2015  [name] [name] [birth year] [id number]   Spoke with inpatient RNCM about patient. Went to bedside earlier to speak with  [name] [name] about [name] [name] program services. [name] [name] asked that writer come back later to speak with wife. She went down to get lunch at the time.   Second bedside visit made. Spoke with patient's wife about [name] [name] program services. She states she is not sure and would like to think about it and speak with patient about [name] [name] prior to consenting. Accepted [name] [name] brochure and contact information to call in case they are agreeable to [name] [name] follow up. Will make inpatient RNCM aware patient and wife decline to sign up with [name] [name] at this time.  [name] Rolling, MSN-Ed, RN,BSN [facility] Liaison [phone]

## 2015-07-03 NOTE — Progress Notes (Signed)
Occupational Therapy Treatment Patient Details Name: [name] [name] MRN: [id number] DOB: [birth year] Today's Date: 07/03/2015    History of present illness 65 yo Male with PMH significant for HTN, Atrial Fib on warfarin, CVA, and known AAA.   Underwent arterial aortogram with lower extremity 1/25 which discovered occluded R renal artery moderate calcific stenosis and bilateral iliac arteries right greater than left, and diseased suprarenal aorta. He was admitted on 06/09/15 for open repair of infrarenal aorta with L renal artery bypass and possible R renal artery bypass.   OT comments  Pt progressing towards acute OT goals. Focus of session was toilet transfers and grooming tasks. Energy conservation and fall prevention education also discussed. Pt requesting to ambulate in the hall ways. Completed household distance ambulation on RA. SOB noted. O2 in supine at end of session 99-100.  Follow Up Recommendations  Home health OT;Supervision/Assistance - 24 hour    Equipment Recommendations       Recommendations for Other Services      Precautions / Restrictions Precautions Precautions: Fall Precaution Comments: CVVHD transitioned to HD on 06/17/15 Restrictions Weight Bearing Restrictions: No       Mobility Bed Mobility Overal bed mobility: Modified Independent Bed Mobility: Supine to Sit;Sit to Supine     Supine to sit: Modified independent (Device/Increase time) Sit to supine: Modified independent (Device/Increase time)   General bed mobility comments: used rail, HOB elevated partially  Transfers Overall transfer level: Needs assistance Equipment used: None;Rolling walker (2 wheeled) Transfers: Sit to/from Stand Sit to Stand: Min guard         General transfer comment: from EOB and regular height toilet with grab bar. min guard for safety.    Balance Overall balance assessment: Needs assistance Sitting-balance support: No upper extremity supported;Feet supported Sitting  balance-Leahy Scale: Good     Standing balance support: No upper extremity supported;Bilateral upper extremity supported;During functional activity Standing balance-Leahy Scale: Fair Standing balance comment: can stand without UE support, but needs walker for dynamic activities                   ADL Overall ADL's : Needs assistance/impaired     Grooming: Oral care;Wash/dry hands;Brushing hair;Min guard;Standing Grooming Details (indicate cue type and reason): use of sink at times for external support                 Toilet Transfer: Min guard;Ambulation;RW;Comfort height toilet;Grab bars Toilet Transfer Details (indicate cue type and reason): min guard for safety         Functional mobility during ADLs: Min guard;Rolling walker General ADL Comments: Pt recalling and planning for foley with mobility. Pt requesting to walk in the halls. Motivated. Min guard for safety OOB. Some unsteadiness noted in static standing (swaying). Educated on energy conservation and fall prevention. RW utilized for household distance ambulation.      Vision                     Perception     Praxis      Cognition   Behavior During Therapy: [facility] for tasks assessed/performed                         Extremity/Trunk Assessment               Exercises     Shoulder Instructions       General Comments      Pertinent Vitals/ Pain  Pain Assessment: No/denies pain  Home Living                                          Prior Functioning/Environment              Frequency Min 2X/week     Progress Toward Goals  OT Goals(current goals can now be found in the care plan section)  Progress towards OT goals: Progressing toward goals  Acute Rehab OT Goals Patient Stated Goal: go home OT Goal Formulation: With patient Time For Goal Achievement: 07/10/15 Potential to Achieve Goals: Good ADL Goals Pt Will Perform Grooming: with min  assist;standing Pt Will Perform Upper Body Dressing: with min assist;sitting Pt Will Perform Lower Body Dressing: with min assist;sit to/from stand Pt Will Transfer to Toilet: with min assist;ambulating;bedside commode Pt Will Perform Toileting - Clothing Manipulation and hygiene: with min assist;sit to/from stand Additional ADL Goal #1: Pt will demonstrate use of energy conservation strategies and breathing technique during ADL and mobility with min verbal cues.  Plan Discharge plan remains appropriate    Co-evaluation                 End of Session Equipment Utilized During Treatment: Gait belt;Rolling walker   Activity Tolerance Patient tolerated treatment well   Patient Left in bed;with call bell/phone within reach;with family/visitor present   Nurse Communication          Time: [id number] OT Time Calculation (min): 21 min  Charges: OT General Charges $OT Visit: 1 Procedure OT Treatments $Self Care/Home Management : 8-22 mins  Hortencia Pilar 07/03/2015, 2:08 PM

## 2015-07-04 LAB — RENAL FUNCTION PANEL
Albumin: 1.9 g/dL — ABNORMAL LOW (ref 3.5–5.0)
Anion gap: 19 — ABNORMAL HIGH (ref 5–15)
BUN: 117 mg/dL — ABNORMAL HIGH (ref 6–20)
CO2: 17 mmol/L — ABNORMAL LOW (ref 22–32)
Calcium: 8.5 mg/dL — ABNORMAL LOW (ref 8.9–10.3)
Chloride: 103 mmol/L (ref 101–111)
Creatinine, Ser: 12.15 mg/dL — ABNORMAL HIGH (ref 0.61–1.24)
GFR calc Af Amer: 4 mL/min — ABNORMAL LOW (ref >60)
GFR calc non Af Amer: 4 mL/min — ABNORMAL LOW (ref >60)
Glucose, Bld: 127 mg/dL — ABNORMAL HIGH (ref 65–99)
Phosphorus: 9.8 mg/dL — ABNORMAL HIGH (ref 2.5–4.6)
Potassium: 4.4 mmol/L (ref 3.5–5.1)
Sodium: 139 mmol/L (ref 135–145)

## 2015-07-04 LAB — GLUCOSE, CAPILLARY
Glucose-Capillary: 98 mg/dL (ref 65–99)
Glucose-Capillary: 131 mg/dL — ABNORMAL HIGH (ref 65–99)
Glucose-Capillary: 153 mg/dL — ABNORMAL HIGH (ref 65–99)
Glucose-Capillary: 113 mg/dL — ABNORMAL HIGH (ref 65–99)

## 2015-07-04 LAB — MAGNESIUM: Magnesium: 1.6 mg/dL — ABNORMAL LOW (ref 1.7–2.4)

## 2015-07-04 LAB — PROTIME-INR
INR: 3.47 — ABNORMAL HIGH (ref 0.00–1.49)
Prothrombin Time: 34.1 seconds — ABNORMAL HIGH (ref 11.6–15.2)

## 2015-07-04 MED ORDER — ONDANSETRON HCL 4 MG/2ML IJ SOLN
4.0000 mg | Freq: Four times a day (QID) | INTRAMUSCULAR | Status: DC | PRN
  Administered 2015-07-04 – 2015-07-06 (×4): 4 mg via INTRAVENOUS
  Filled 2015-07-04 (×4): qty 2

## 2015-07-04 MED ORDER — SODIUM BICARBONATE 650 MG PO TABS
650.0000 mg | ORAL_TABLET | Freq: Three times a day (TID) | ORAL | Status: DC
  Administered 2015-07-04 – 2015-07-07 (×10): 650 mg via ORAL
  Filled 2015-07-04 (×10): qty 1

## 2015-07-04 NOTE — Progress Notes (Signed)
Patient vomited. PA paged and received verbal order for Zofran. Will continue to monitor.

## 2015-07-04 NOTE — Progress Notes (Addendum)
[facility]  Subjective  - Doing well over all.   Objective 156/91 97 98 F (36.7 C) (Oral) 18 99%  Intake/Output Summary (Last 24 hours) at 07/04/15 0957 Last data filed at 07/04/15 [id number]  Gross per 24 hour  Intake    360 ml  Output   1326 ml  Net   -966 ml    Feet well perfused, non labored breathing Ambulating Abdomen soft non tender  Assessment/Planning: POD # 8  repair of juxtarenal AAA, left renal bypass, pancreatitis, AKI  Pending HD plans over the week will maintain treat plan  Coumadin 3.47 management per pharmacy Possible D/C Monday  Laurence Slate [facility] 07/04/2015 9:57 AM -- Agree with above.  Slowly returning renal function.  Overall doing well.  Will keep over weekend to see if we can avoid sending home on dialysis.  Ruta Hinds, MD [facility] [place] Office: [phone] Pager: [phone]  Laboratory Lab Results:  Recent Labs  07/02/15 0307 07/03/15 0400  WBC 7.4 7.7  HGB 8.4* 8.3*  HCT 26.3* 25.9*  PLT 262 249   BMET  Recent Labs  07/03/15 0400 07/04/15 0320  NA 139 139  K 4.4 4.4  CL 102 103  CO2 19* 17*  GLUCOSE 121* 127*  BUN 112* 117*  CREATININE 12.08* 12.15*  CALCIUM 8.3* 8.5*    COAG Lab Results  Component Value Date   INR 3.47* 07/04/2015   INR 3.28* 07/03/2015   INR 2.66* 07/02/2015   No results found for: PTT

## 2015-07-04 NOTE — Progress Notes (Signed)
[facility] ROUNDING NOTE   Subjective:   Interval History: creatinine plateau  Objective:  Vital signs in last 24 hours:  Temp:  [98 F (36.7 C)-98.2 F (36.8 C)] 98 F (36.7 C) (03/03 0309) Pulse Rate:  [88-97] 97 (03/03 0309) Resp:  [18-20] 18 (03/03 0309) BP: (147-167)/(71-91) 156/91 mmHg (03/03 0309) SpO2:  [98 %-99 %] 99 % (03/03 0309) Weight:  [84.1 kg (185 lb 6.5 oz)] 84.1 kg (185 lb 6.5 oz) (03/03 0309)  Weight change: -0.6 kg (-1 lb 5.2 oz) Filed Weights   07/02/15 0422 07/03/15 0428 07/04/15 0309  Weight: 86 kg (189 lb 9.5 oz) 84.7 kg (186 lb 11.7 oz) 84.1 kg (185 lb 6.5 oz)    Intake/Output: I/O last 3 completed shifts: In: 1100 [P.O.:1100] Out: 1801 [Urine:1800; Stool:1]   Intake/Output this shift:     CVS- RRR RS- CTA ABD- BS present soft non-distended EXT- no edema   Basic Metabolic Panel:  Recent Labs Lab 06/30/15 0241 07/01/15 0420 07/02/15 0307 07/03/15 0400 07/04/15 0320  NA 136 139 138 139 139  K 3.9 4.6 4.3 4.4 4.4  CL 100* 100* 101 102 103  CO2 20* 22 19* 19* 17*  GLUCOSE 169* 89 87 121* 127*  BUN 75* 89* 102* 112* 117*  CREATININE 8.77* 10.45* 11.48* 12.08* 12.15*  CALCIUM 8.5* 8.6* 8.4* 8.3* 8.5*  MG 1.6* 1.8 1.7 1.7 1.6*  PHOS 6.8* 9.6* 10.6* 10.6* 9.8*    Liver Function Tests:  Recent Labs Lab 06/30/15 0241 07/01/15 0420 07/02/15 0307 07/03/15 0400 07/04/15 0320  ALBUMIN 1.6* 1.7* 1.6* 1.7* 1.9*   No results for input(s): LIPASE, AMYLASE in the last 168 hours. No results for input(s): AMMONIA in the last 168 hours.  CBC:  Recent Labs Lab 06/29/15 0404 06/30/15 0241 07/01/15 0420 07/02/15 0307 07/03/15 0400  WBC 9.3 9.2 8.4 7.4 7.7  HGB 8.6* 8.7* 8.7* 8.4* 8.3*  HCT 26.8* 27.5* 26.9* 26.3* 25.9*  MCV 85.4 85.9 85.7 85.7 86.0  PLT 263 269 262 262 249    Cardiac Enzymes: No results for input(s): CKTOTAL, CKMB, CKMBINDEX, TROPONINI in the last 168 hours.  BNP: Invalid input(s):  POCBNP  CBG:  Recent Labs Lab 07/03/15 0644 07/03/15 1159 07/03/15 1606 07/03/15 2057 07/04/15 0605  GLUCAP 90 135* 120* 101* 98    Microbiology: Results for orders placed or performed during the hospital encounter of 06/09/15  Culture, respiratory (NON-Expectorated)     Status: None   Collection Time: 06/13/15 10:08 AM  Result Value Ref Range Status   Specimen Description ENDOTRACHEAL  Final   Special Requests NONE  Final   Gram Stain   Final    ABUNDANT WBC PRESENT,BOTH PMN AND MONONUCLEAR FEW SQUAMOUS EPITHELIAL CELLS PRESENT ABUNDANT GRAM NEGATIVE RODS FEW GRAM POSITIVE COCCI IN PAIRS Performed at [facility]    Culture   Final    ABUNDANT SERRATIA MARCESCENS Performed at [facility]    Report Status 06/15/2015 FINAL  Final   Organism ID, Bacteria SERRATIA MARCESCENS  Final      Susceptibility   Serratia marcescens - MIC*    CEFAZOLIN >=64 RESISTANT Resistant     CEFEPIME <=1 SENSITIVE Sensitive     CEFTAZIDIME <=1 SENSITIVE Sensitive     CEFTRIAXONE <=1 SENSITIVE Sensitive     CIPROFLOXACIN <=0.25 SENSITIVE Sensitive     GENTAMICIN <=1 SENSITIVE Sensitive     TOBRAMYCIN <=1 SENSITIVE Sensitive     TRIMETH/SULFA Value in next row Sensitive      <=  20 SENSITIVE(NOTE)    * ABUNDANT SERRATIA MARCESCENS  Culture, blood (routine x 2)     Status: None   Collection Time: 06/20/15 11:57 AM  Result Value Ref Range Status   Specimen Description BLOOD RIGHT ANTECUBITAL  Final   Special Requests BOTTLES DRAWN AEROBIC AND ANAEROBIC 10CCS  Final   Culture NO GROWTH 5 DAYS  Final   Report Status 06/25/2015 FINAL  Final  Culture, blood (single) w Reflex to ID Panel     Status: None   Collection Time: 06/20/15  1:25 PM  Result Value Ref Range Status   Specimen Description BLOOD CONFIRMED BY CLINITEST AS NEGATIVE  Final   Special Requests   Final    BOTTLES DRAWN AEROBIC AND ANAEROBIC  5CCS RIGHT IJ LINE   Culture NO GROWTH 5 DAYS  Final   Report Status  06/25/2015 FINAL  Final  Culture, blood (routine x 2)     Status: None   Collection Time: 06/20/15  1:25 PM  Result Value Ref Range Status   Specimen Description BLOOD CONFIRMED BY CLINITEST AS NEGATIVE  Final   Special Requests   Final    BOTTLES DRAWN AEROBIC AND ANAEROBIC 5CCS LEFT IJ LINE   Culture NO GROWTH 5 DAYS  Final   Report Status 06/25/2015 FINAL  Final    Coagulation Studies:  Recent Labs  07/02/15 0307 07/03/15 0400 07/04/15 0320  LABPROT 27.9* 32.8* 34.1*  INR 2.66* 3.28* 3.47*    Urinalysis: No results for input(s): COLORURINE, LABSPEC, PHURINE, GLUCOSEU, HGBUR, BILIRUBINUR, KETONESUR, PROTEINUR, UROBILINOGEN, NITRITE, LEUKOCYTESUR in the last 72 hours.  Invalid input(s): APPERANCEUR    Imaging: No results found.   Medications:   . sodium chloride 20 mL/hr at 06/24/15 2300  . sodium chloride     . alteplase  2 mg Intracatheter Once  . antiseptic oral rinse  7 mL Mouth Rinse q12n4p  . atorvastatin  20 mg Oral QHS  . chlorhexidine  15 mL Mouth Rinse BID  . darbepoetin (ARANESP) injection - NON-DIALYSIS  150 mcg Subcutaneous Q Wed-1800  . docusate sodium  100 mg Oral BID  . feeding supplement (NEPRO CARB STEADY)  237 mL Oral BID BM  . insulin aspart  0-9 Units Subcutaneous TID WC  . labetalol  200 mg Oral BID  . sodium bicarbonate  650 mg Oral TID  . sodium chloride flush  10-40 mL Intracatheter Q12H  . tamsulosin  0.4 mg Oral QPC supper  . Warfarin - Pharmacist Dosing Inpatient   Does not apply q1800   acetaminophen **OR** acetaminophen, bisacodyl, fentaNYL (SUBLIMAZE) injection, heparin, hydrALAZINE, ipratropium, levalbuterol, oxyCODONE-acetaminophen, sodium chloride flush, zolpidem  Assessment/ Plan:  1. A on CRF- crt of 1.27 on 2/2- AKI in the setting of a chronically nonfunctional right kdney and hemodynamic changes associated with AAA repair and left renal artery bypass on 2/6. required CRRT 2/9- 2/13- then IHD 2/15 , 2/17 2/20 , 2/22 and  2/24- averaging about 500-600 of urine q day but had not been able to pass - s/p foley so we can keep track but also to spare him the repeated in and out caths Creatinine plateau at 12  2. HTN/vol- Improved Changed metoprolol to labetalol 3. Anemia- due to surgery and CKD- - started aranesp and transfuse as needed- replete iron as well 4. S./p AAA repair and renal artery bypass - per [facility]VS 5. Bones- phos down with HD only- PTH 92 6. Hypokalemia- Stable 7. GI issues- requiring TNA but now weaned off-  albumin quite poor 8. BOO- several days of not being able to void- on flomax and placed foley on 2/25  Good urine output today and continues to do well . Creatinine same anticipate recovery      LOS: 25 Gaylynn Seiple W @TODAY @9 :48 AM

## 2015-07-04 NOTE — Progress Notes (Signed)
ANTICOAGULATION CONSULT NOTE - Follow Up Consult  Pharmacy Consult for Coumadin Indication: atrial fibrillation and stroke  No Known Allergies  Patient Measurements: Height: 6' (182.9 cm) Weight: 185 lb 6.5 oz (84.1 kg) IBW/kg (Calculated) : 77.6  Vital Signs: Temp: 98 F (36.7 C) (03/03 0309) Temp Source: Oral (03/03 0309) BP: 156/91 mmHg (03/03 0309) Pulse Rate: 97 (03/03 0309)  Labs:  Recent Labs  07/02/15 0307 07/03/15 0400 07/04/15 0320  HGB 8.4* 8.3*  --   HCT 26.3* 25.9*  --   PLT 262 249  --   LABPROT 27.9* 32.8* 34.1*  INR 2.66* 3.28* 3.47*  CREATININE 11.48* 12.08* 12.15*    Estimated Creatinine Clearance: 6.9 mL/min (by C-G formula based on Cr of 12.15).  Assessment: 64yom on coumadin pta for afib and hx stroke. He underwent elective repair of his AAA along with left renal artery bypass on 2/6. Post-op course complicated by renal failure, first requiring CRRT, now on iHD. He had an HD tunneled catheter placed 2/23. Coumadin resumed 2/24. INR continues to trend up to 3.47 despite holding yesterday's dose. No bleeding.  Home dose: 5mg  daily except 7.5mg  on Wed/Thurs  Goal of Therapy:  INR 2-3 Monitor platelets by anticoagulation protocol: Yes   Plan:  1) No coumadin tonight 2) Daily INR  Deboraha Sprang 07/04/2015,9:18 AM

## 2015-07-04 NOTE — Care Management Important Message (Signed)
Important Message  Patient Details  Name: [name] [name] MRN: [id number] Date of Birth: 1950-08-30   Medicare Important Message Given:  Yes    Nathen May 07/04/2015, 11:52 AM

## 2015-07-04 NOTE — Care Management Note (Signed)
Case Management Note Previous CM note initiated by Elenor Quinones RN, CM  Patient Details  Name: AIZEN MALO MRN: [id number] Date of Birth: 11-03-1950  Subjective/Objective:    He underwent an elective surgical repair including an aorto to left renal bypass procedure on 06/09/2015 and developed VDRF -  Transferred to ICU                Action/Plan:  07/04/2015  CM spoke with pt directly while in HD and he is in agreement to discharge plan detailed below.  06/20/15 Pt extubated, on intermittent HD, IV amio, TPN due to prolonged post op ileus.  Plan is for pt to have tunneled HD catheter placed early next week.  Pt deemed appropriate for LTACH per attending and physician advisor.  Pt in HD, CM spoke with wife , agreement was obtained for [facility] placement insurance authorization for possible discharge early next week, wife offered choice of [facility] and [facility], chose [facility] due to location.  Wife advised CM against talking with pt during HD; wife wants to discuss it with pt this weekend.  CM will follow up on Monday and make adjustments to discharge plans accordingly.  [facility] actively seeking insurance auth   Pt re intubated 06/13/15  Pt is from home with wife and daughter serves as support person.  CM will continue to monitor for disposition needs   Expected Discharge Date:                  Expected Discharge Plan:  [place]  In-House Referral:     Discharge planning Services  CM Consult  Post Acute Care Choice:  Home Health, Durable Medical Equipment Choice offered to:     DME Arranged:  [facility] DME Agency:     HH Arranged:    HH Agency:     Status of Service:  In process, will continue to follow  Medicare Important Message Given:  Yes Date Medicare IM Given:    Medicare IM give by:    Date Additional Medicare IM Given:    Additional Medicare Important Message give by:     If discussed at [place] of Stay Meetings, dates discussed:   07/03/15  Additional Comments:  07/04/15- [place] RN, BSN- pt making good progress with mobility- plan for home with [facility]- will need RW- orders will need to be placed for both [facility] and DME at discharge- still awaiting renal recovery and to determine if pt will need outpt HD. CM to continue to follow  06/24/15- 1000- Marvetta Gibbons RN, BSN-  Pt has been denied LTACH by insurance [employer]- per Aaron Edelman with [facility]- attending MD could do a peer to peer if felt like LTACH is pt's best option however- pt progressing well with therapies- CIR also following (see note), pt remains on TNA for now - diet has been advanced- per notes do not see any documentation for any wounds. Renal following and note for possible tunneled cath at the end of the week for ?continued HD. CM to continue to follow for d/c needs- pt may progress to being able to go home with [facility].  Dawayne Patricia, RN 07/04/2015, 11:41 AM

## 2015-07-05 LAB — RENAL FUNCTION PANEL
Albumin: 1.8 g/dL — ABNORMAL LOW (ref 3.5–5.0)
Anion gap: 16 — ABNORMAL HIGH (ref 5–15)
BUN: 125 mg/dL — ABNORMAL HIGH (ref 6–20)
CO2: 19 mmol/L — ABNORMAL LOW (ref 22–32)
Calcium: 8.3 mg/dL — ABNORMAL LOW (ref 8.9–10.3)
Chloride: 104 mmol/L (ref 101–111)
Creatinine, Ser: 12.13 mg/dL — ABNORMAL HIGH (ref 0.61–1.24)
GFR calc Af Amer: 4 mL/min — ABNORMAL LOW (ref >60)
GFR calc non Af Amer: 4 mL/min — ABNORMAL LOW (ref >60)
Glucose, Bld: 102 mg/dL — ABNORMAL HIGH (ref 65–99)
Phosphorus: 10 mg/dL — ABNORMAL HIGH (ref 2.5–4.6)
Potassium: 4.3 mmol/L (ref 3.5–5.1)
Sodium: 139 mmol/L (ref 135–145)

## 2015-07-05 LAB — PROTIME-INR
INR: 3.51 — ABNORMAL HIGH (ref 0.00–1.49)
Prothrombin Time: 34.4 seconds — ABNORMAL HIGH (ref 11.6–15.2)

## 2015-07-05 LAB — GLUCOSE, CAPILLARY
Glucose-Capillary: 103 mg/dL — ABNORMAL HIGH (ref 65–99)
Glucose-Capillary: 109 mg/dL — ABNORMAL HIGH (ref 65–99)
Glucose-Capillary: 136 mg/dL — ABNORMAL HIGH (ref 65–99)
Glucose-Capillary: 92 mg/dL (ref 65–99)

## 2015-07-05 LAB — MAGNESIUM: Magnesium: 1.6 mg/dL — ABNORMAL LOW (ref 1.7–2.4)

## 2015-07-05 MED ORDER — AMLODIPINE BESYLATE 5 MG PO TABS
5.0000 mg | ORAL_TABLET | Freq: Every day | ORAL | Status: DC
  Administered 2015-07-05 – 2015-07-07 (×3): 5 mg via ORAL
  Filled 2015-07-05 (×3): qty 1

## 2015-07-05 NOTE — Progress Notes (Addendum)
Pt. Had 9 beats of VTach. Asymptomatic. Complained of Chronic back pain, given 2 tablets of percocet. MD paged no response. Will continue to monitor.

## 2015-07-05 NOTE — Progress Notes (Signed)
ANTICOAGULATION CONSULT NOTE - Follow Up Consult  Pharmacy Consult for Coumadin Indication: atrial fibrillation and stroke  No Known Allergies  Patient Measurements: Height: 6' (182.9 cm) Weight: 184 lb 9.6 oz (83.734 kg) IBW/kg (Calculated) : 77.6  Vital Signs: Temp: 98 F (36.7 C) (03/04 0930) Temp Source: Oral (03/04 0930) BP: 161/95 mmHg (03/04 0930) Pulse Rate: 101 (03/04 0930)  Labs:  Recent Labs  07/03/15 0400 07/04/15 0320 07/05/15 0325  HGB 8.3*  --   --   HCT 25.9*  --   --   PLT 249  --   --   LABPROT 32.8* 34.1* 34.4*  INR 3.28* 3.47* 3.51*  CREATININE 12.08* 12.15* 12.13*    Estimated Creatinine Clearance: 6.9 mL/min (by C-G formula based on Cr of 12.13).  Assessment: 64yom on coumadin pta for afib and hx stroke. He underwent elective repair of his AAA along with left renal artery bypass on 2/6. Post-op course complicated by renal failure, first requiring CRRT, now on iHD. He had an HD tunneled catheter placed 2/23. Coumadin resumed 2/24. INR continues to trend up to 3.51 despite holding x 2 days. No bleeding.  Home dose: 5mg  daily except 7.5mg  on Wed/Thurs  Goal of Therapy:  INR 2-3 Monitor platelets by anticoagulation protocol: Yes   Plan:  1) No coumadin tonight 2) Daily INR  Deboraha Sprang 07/05/2015,11:06 AM

## 2015-07-05 NOTE — Progress Notes (Signed)
[facility] ROUNDING NOTE   Subjective:   Interval History:  No complaints this morning  Objective:  Vital signs in last 24 hours:  Temp:  [97.8 F (36.6 C)-98.1 F (36.7 C)] 97.8 F (36.6 C) (03/04 0556) Pulse Rate:  [91-100] 92 (03/04 0556) Resp:  [18] 18 (03/04 0556) BP: (142-180)/(70-84) 169/82 mmHg (03/04 0556) SpO2:  [97 %-100 %] 97 % (03/04 0556) Weight:  [83.734 kg (184 lb 9.6 oz)] 83.734 kg (184 lb 9.6 oz) (03/04 0556)  Weight change: -0.366 kg (-12.9 oz) Filed Weights   07/03/15 0428 07/04/15 0309 07/05/15 0556  Weight: 84.7 kg (186 lb 11.7 oz) 84.1 kg (185 lb 6.5 oz) 83.734 kg (184 lb 9.6 oz)    Intake/Output: I/O last 3 completed shifts: In: 1080 [P.O.:1080] Out: 81 [Urine:1725; Emesis/NG output:1; Stool:2]   Intake/Output this shift:     CVS- RRR RS- CTA ABD- BS present soft non-distended EXT- no edema   Basic Metabolic Panel:  Recent Labs Lab 07/01/15 0420 07/02/15 0307 07/03/15 0400 07/04/15 0320 07/05/15 0325  NA 139 138 139 139 139  K 4.6 4.3 4.4 4.4 4.3  CL 100* 101 102 103 104  CO2 22 19* 19* 17* 19*  GLUCOSE 89 87 121* 127* 102*  BUN 89* 102* 112* 117* 125*  CREATININE 10.45* 11.48* 12.08* 12.15* 12.13*  CALCIUM 8.6* 8.4* 8.3* 8.5* 8.3*  MG 1.8 1.7 1.7 1.6* 1.6*  PHOS 9.6* 10.6* 10.6* 9.8* 10.0*    Liver Function Tests:  Recent Labs Lab 07/01/15 0420 07/02/15 0307 07/03/15 0400 07/04/15 0320 07/05/15 0325  ALBUMIN 1.7* 1.6* 1.7* 1.9* 1.8*   No results for input(s): LIPASE, AMYLASE in the last 168 hours. No results for input(s): AMMONIA in the last 168 hours.  CBC:  Recent Labs Lab 06/29/15 0404 06/30/15 0241 07/01/15 0420 07/02/15 0307 07/03/15 0400  WBC 9.3 9.2 8.4 7.4 7.7  HGB 8.6* 8.7* 8.7* 8.4* 8.3*  HCT 26.8* 27.5* 26.9* 26.3* 25.9*  MCV 85.4 85.9 85.7 85.7 86.0  PLT 263 269 262 262 249    Cardiac Enzymes: No results for input(s): CKTOTAL, CKMB, CKMBINDEX, TROPONINI in the last 168  hours.  BNP: Invalid input(s): POCBNP  CBG:  Recent Labs Lab 07/04/15 0605 07/04/15 1145 07/04/15 1605 07/04/15 2128 07/05/15 0632  GLUCAP 98 131* 153* 113* 103*    Microbiology: Results for orders placed or performed during the hospital encounter of 06/09/15  Culture, respiratory (NON-Expectorated)     Status: None   Collection Time: 06/13/15 10:08 AM  Result Value Ref Range Status   Specimen Description ENDOTRACHEAL  Final   Special Requests NONE  Final   Gram Stain   Final    ABUNDANT WBC PRESENT,BOTH PMN AND MONONUCLEAR FEW SQUAMOUS EPITHELIAL CELLS PRESENT ABUNDANT GRAM NEGATIVE RODS FEW GRAM POSITIVE COCCI IN PAIRS Performed at [facility]    Culture   Final    ABUNDANT SERRATIA MARCESCENS Performed at [facility]    Report Status 06/15/2015 FINAL  Final   Organism ID, Bacteria SERRATIA MARCESCENS  Final      Susceptibility   Serratia marcescens - MIC*    CEFAZOLIN >=64 RESISTANT Resistant     CEFEPIME <=1 SENSITIVE Sensitive     CEFTAZIDIME <=1 SENSITIVE Sensitive     CEFTRIAXONE <=1 SENSITIVE Sensitive     CIPROFLOXACIN <=0.25 SENSITIVE Sensitive     GENTAMICIN <=1 SENSITIVE Sensitive     TOBRAMYCIN <=1 SENSITIVE Sensitive     TRIMETH/SULFA Value in next row  Sensitive      <=20 SENSITIVE(NOTE)    * ABUNDANT SERRATIA MARCESCENS  Culture, blood (routine x 2)     Status: None   Collection Time: 06/20/15 11:57 AM  Result Value Ref Range Status   Specimen Description BLOOD RIGHT ANTECUBITAL  Final   Special Requests BOTTLES DRAWN AEROBIC AND ANAEROBIC 10CCS  Final   Culture NO GROWTH 5 DAYS  Final   Report Status 06/25/2015 FINAL  Final  Culture, blood (single) w Reflex to ID Panel     Status: None   Collection Time: 06/20/15  1:25 PM  Result Value Ref Range Status   Specimen Description BLOOD CONFIRMED BY CLINITEST AS NEGATIVE  Final   Special Requests   Final    BOTTLES DRAWN AEROBIC AND ANAEROBIC  5CCS RIGHT IJ LINE   Culture NO  GROWTH 5 DAYS  Final   Report Status 06/25/2015 FINAL  Final  Culture, blood (routine x 2)     Status: None   Collection Time: 06/20/15  1:25 PM  Result Value Ref Range Status   Specimen Description BLOOD CONFIRMED BY CLINITEST AS NEGATIVE  Final   Special Requests   Final    BOTTLES DRAWN AEROBIC AND ANAEROBIC 5CCS LEFT IJ LINE   Culture NO GROWTH 5 DAYS  Final   Report Status 06/25/2015 FINAL  Final    Coagulation Studies:  Recent Labs  07/03/15 0400 07/04/15 0320 07/05/15 0325  LABPROT 32.8* 34.1* 34.4*  INR 3.28* 3.47* 3.51*    Urinalysis: No results for input(s): COLORURINE, LABSPEC, PHURINE, GLUCOSEU, HGBUR, BILIRUBINUR, KETONESUR, PROTEINUR, UROBILINOGEN, NITRITE, LEUKOCYTESUR in the last 72 hours.  Invalid input(s): APPERANCEUR    Imaging: No results found.   Medications:   . sodium chloride 20 mL/hr at 06/24/15 2300  . sodium chloride     . alteplase  2 mg Intracatheter Once  . amLODipine  5 mg Oral Daily  . antiseptic oral rinse  7 mL Mouth Rinse q12n4p  . atorvastatin  20 mg Oral QHS  . chlorhexidine  15 mL Mouth Rinse BID  . darbepoetin (ARANESP) injection - NON-DIALYSIS  150 mcg Subcutaneous Q Wed-1800  . docusate sodium  100 mg Oral BID  . feeding supplement (NEPRO CARB STEADY)  237 mL Oral BID BM  . insulin aspart  0-9 Units Subcutaneous TID WC  . labetalol  200 mg Oral BID  . sodium bicarbonate  650 mg Oral TID  . sodium chloride flush  10-40 mL Intracatheter Q12H  . tamsulosin  0.4 mg Oral QPC supper  . Warfarin - Pharmacist Dosing Inpatient   Does not apply q1800   acetaminophen **OR** acetaminophen, bisacodyl, fentaNYL (SUBLIMAZE) injection, heparin, hydrALAZINE, ipratropium, levalbuterol, ondansetron (ZOFRAN) IV, oxyCODONE-acetaminophen, sodium chloride flush, zolpidem  Assessment/ Plan:  1. A on CRF- crt of 1.27 on 2/2- AKI in the setting of a chronically nonfunctional right kdney and hemodynamic changes associated with AAA repair and left  renal artery bypass on 2/6. required CRRT 2/9- 2/13- then IHD 2/15 , 2/17 2/20 , 2/22 and 2/24- averaging about 500-600 of urine q day but had not been able to pass - s/p foley so we can keep track but also to spare him the repeated in and out caths Creatinine plateau at 12  2. HTN/vol- Improved Changed metoprolol to labetalol 3. Anemia- due to surgery and CKD- - started aranesp and transfuse as needed- replete iron as well 4. S./p AAA repair and renal artery bypass - per [facility]VS 5. Bones- phos down  with HD only- PTH 92 6. Hypokalemia- Stable 7. GI issues- requiring TNA but now weaned off- albumin quite poor 8. BOO- several days of not being able to void- on flomax and placed foley on 2/25  Good urine output today and continues to do well . Creatinine same anticipate recovery      LOS: 26 Sumit Branham W @TODAY @9 :21 AM

## 2015-07-05 NOTE — Progress Notes (Addendum)
[facility]  Subjective  - Doing OK.  He is urinating often.  He had a bout of vomiting   Objective 169/82 92 97.8 F (36.6 C) (Oral) 18 97%  Intake/Output Summary (Last 24 hours) at 07/05/15 0816 Last data filed at 07/05/15 0402  Gross per 24 hour  Intake    840 ml  Output   1177 ml  Net   -337 ml    Feet well perfused Ambulating Abdomin soft Heart RRR Lungs Non labored breathing   Assessment/Planning: POD # 9 repair of juxtarenal AAA, left renal bypass, pancreatitis, AKI  Pending HD plans over the week will maintain treat plan   Coumadin   Held yesterday Continues to rise dose per pharmacy     [place], [facility] 07/05/2015 8:16 AM --  Laboratory Lab Results:  Recent Labs  07/03/15 0400  WBC 7.7  HGB 8.3*  HCT 25.9*  PLT 249   BMET  Recent Labs  07/04/15 0320 07/05/15 0325  NA 139 139  K 4.4 4.3  CL 103 104  CO2 17* 19*  GLUCOSE 127* 102*  BUN 117* 125*  CREATININE 12.15* 12.13*  CALCIUM 8.5* 8.3*    COAG Lab Results  Component Value Date   INR 3.51* 07/05/2015   INR 3.47* 07/04/2015   INR 3.28* 07/03/2015   No results found for: PTT    I agree with the above.  I have seen and evaluated the patient.  His creatinine is starting to trend down.  Continue with Coumadin, monitoring INR which was supratherapeutic.  Annamarie Major

## 2015-07-06 LAB — RENAL FUNCTION PANEL
Albumin: 1.9 g/dL — ABNORMAL LOW (ref 3.5–5.0)
Anion gap: 16 — ABNORMAL HIGH (ref 5–15)
BUN: 111 mg/dL — ABNORMAL HIGH (ref 6–20)
CO2: 19 mmol/L — ABNORMAL LOW (ref 22–32)
Calcium: 8.6 mg/dL — ABNORMAL LOW (ref 8.9–10.3)
Chloride: 105 mmol/L (ref 101–111)
Creatinine, Ser: 11.33 mg/dL — ABNORMAL HIGH (ref 0.61–1.24)
GFR calc Af Amer: 5 mL/min — ABNORMAL LOW (ref >60)
GFR calc non Af Amer: 4 mL/min — ABNORMAL LOW (ref >60)
Glucose, Bld: 107 mg/dL — ABNORMAL HIGH (ref 65–99)
Phosphorus: 9 mg/dL — ABNORMAL HIGH (ref 2.5–4.6)
Potassium: 4.4 mmol/L (ref 3.5–5.1)
Sodium: 140 mmol/L (ref 135–145)

## 2015-07-06 LAB — GLUCOSE, CAPILLARY
Glucose-Capillary: 88 mg/dL (ref 65–99)
Glucose-Capillary: 112 mg/dL — ABNORMAL HIGH (ref 65–99)
Glucose-Capillary: 146 mg/dL — ABNORMAL HIGH (ref 65–99)
Glucose-Capillary: 109 mg/dL — ABNORMAL HIGH (ref 65–99)

## 2015-07-06 LAB — PROTIME-INR
INR: 2.83 — ABNORMAL HIGH (ref 0.00–1.49)
Prothrombin Time: 29.3 seconds — ABNORMAL HIGH (ref 11.6–15.2)

## 2015-07-06 LAB — MAGNESIUM: Magnesium: 1.6 mg/dL — ABNORMAL LOW (ref 1.7–2.4)

## 2015-07-06 MED ORDER — WARFARIN SODIUM 7.5 MG PO TABS
7.5000 mg | ORAL_TABLET | Freq: Once | ORAL | Status: AC
  Administered 2015-07-06: 7.5 mg via ORAL
  Filled 2015-07-06: qty 1

## 2015-07-06 NOTE — Progress Notes (Signed)
Foley catheter removed per orders, tolerated well, will monitor for urine output.  Edward Qualia RN

## 2015-07-06 NOTE — Progress Notes (Signed)
Subjective  -   Feels good, wants to go home   Physical Exam:  Incisions intact Feet warm       Assessment/Plan:    Cr trending down, 11 today.  Good UOP, will d./c foley Coumadin, INR 2.8, per pharmacy   [name] [name] 07/06/2015 10:48 AM --  Danley Danker Vitals:   07/05/15 1940 07/06/15 0505  BP: 158/75 158/80  Pulse: 94 103  Temp: 97.9 F (36.6 C) 97.8 F (36.6 C)  Resp: 18 18    Intake/Output Summary (Last 24 hours) at 07/06/15 1048 Last data filed at 07/06/15 0214  Gross per 24 hour  Intake    480 ml  Output   1250 ml  Net   -770 ml     Laboratory CBC    Component Value Date/Time   WBC 7.7 07/03/2015 0400   HGB 8.3* 07/03/2015 0400   HCT 25.9* 07/03/2015 0400   PLT 249 07/03/2015 0400    BMET    Component Value Date/Time   NA 140 07/06/2015 0512   K 4.4 07/06/2015 0512   CL 105 07/06/2015 0512   CO2 19* 07/06/2015 0512   GLUCOSE 107* 07/06/2015 0512   BUN 111* 07/06/2015 0512   CREATININE 11.33* 07/06/2015 0512   CREATININE 1.10 11/07/2014 0804   CALCIUM 8.6* 07/06/2015 0512   GFRNONAA 4* 07/06/2015 0512   GFRNONAA 53* 05/14/2013 0905   GFRAA 5* 07/06/2015 0512   GFRAA 62 05/14/2013 0905    COAG Lab Results  Component Value Date   INR 2.83* 07/06/2015   INR 3.51* 07/05/2015   INR 3.47* 07/04/2015   No results found for: PTT  Antibiotics Anti-infectives    Start     Dose/Rate Route Frequency Ordered Stop   06/26/15 0945  cefUROXime (ZINACEF) 1.5 g in dextrose 5 % 50 mL IVPB     1.5 g 100 mL/hr over 30 Minutes Intravenous To ShortStay Surgical 06/26/15 0931 06/26/15 1015   06/16/15 1300  cefTRIAXone (ROCEPHIN) 1 g in dextrose 5 % 50 mL IVPB     1 g 100 mL/hr over 30 Minutes Intravenous Every 24 hours 06/16/15 1154 06/19/15 1358   06/14/15 1200  vancomycin (VANCOCIN) IVPB 1000 mg/200 mL premix  Status:  Discontinued     1,000 mg 200 mL/hr over 60 Minutes Intravenous Every 24 hours 06/13/15 1014 06/15/15 1101   06/13/15 1200   piperacillin-tazobactam (ZOSYN) IVPB 3.375 g  Status:  Discontinued     3.375 g 100 mL/hr over 30 Minutes Intravenous 4 times per day 06/13/15 1014 06/16/15 1154   06/13/15 1015  vancomycin (VANCOCIN) 1,500 mg in sodium chloride 0.9 % 250 mL IVPB     1,500 mg 250 mL/hr over 60 Minutes Intravenous  Once 06/13/15 1014 06/13/15 1143   06/09/15 2000  cefUROXime (ZINACEF) 1.5 g in dextrose 5 % 50 mL IVPB     1.5 g 100 mL/hr over 30 Minutes Intravenous Every 12 hours 06/09/15 1522 06/10/15 0855   06/09/15 1130  cefUROXime (ZINACEF) 1.5 g in dextrose 5 % 50 mL IVPB  Status:  Discontinued     1.5 g 100 mL/hr over 30 Minutes Intravenous To Surgery 06/09/15 1123 06/09/15 1509   06/09/15 0700  cefUROXime (ZINACEF) 1.5 g in dextrose 5 % 50 mL IVPB     1.5 g 100 mL/hr over 30 Minutes Intravenous To ShortStay Surgical 06/08/15 1434 06/09/15 1200       V. Leia Alf, M.D. [facility]  Office: [phone] Pager:  [phone]

## 2015-07-06 NOTE — Progress Notes (Signed)
[facility] ROUNDING NOTE   Subjective:   Interval History: no issues wants to go home  Objective:  Vital signs in last 24 hours:  Temp:  [97.8 F (36.6 C)-98 F (36.7 C)] 97.8 F (36.6 C) (03/05 0505) Pulse Rate:  [94-103] 103 (03/05 0505) Resp:  [18-20] 18 (03/05 0505) BP: (146-161)/(73-95) 158/80 mmHg (03/05 0505) SpO2:  [98 %-99 %] 98 % (03/05 0505) Weight:  [83.371 kg (183 lb 12.8 oz)] 83.371 kg (183 lb 12.8 oz) (03/05 0505)  Weight change: -0.363 kg (-12.8 oz) Filed Weights   07/04/15 0309 07/05/15 0556 07/06/15 0505  Weight: 84.1 kg (185 lb 6.5 oz) 83.734 kg (184 lb 9.6 oz) 83.371 kg (183 lb 12.8 oz)    Intake/Output: I/O last 3 completed shifts: In: 1320 [P.O.:1320] Out: 2776 [Urine:2775; Stool:1]   Intake/Output this shift:     CVS- RRR RS- CTA ABD- BS present soft non-distended EXT- no edema   Basic Metabolic Panel:  Recent Labs Lab 07/02/15 0307 07/03/15 0400 07/04/15 0320 07/05/15 0325 07/06/15 0512  NA 138 139 139 139 140  K 4.3 4.4 4.4 4.3 4.4  CL 101 102 103 104 105  CO2 19* 19* 17* 19* 19*  GLUCOSE 87 121* 127* 102* 107*  BUN 102* 112* 117* 125* 111*  CREATININE 11.48* 12.08* 12.15* 12.13* 11.33*  CALCIUM 8.4* 8.3* 8.5* 8.3* 8.6*  MG 1.7 1.7 1.6* 1.6* 1.6*  PHOS 10.6* 10.6* 9.8* 10.0* 9.0*    Liver Function Tests:  Recent Labs Lab 07/02/15 0307 07/03/15 0400 07/04/15 0320 07/05/15 0325 07/06/15 0512  ALBUMIN 1.6* 1.7* 1.9* 1.8* 1.9*   No results for input(s): LIPASE, AMYLASE in the last 168 hours. No results for input(s): AMMONIA in the last 168 hours.  CBC:  Recent Labs Lab 06/30/15 0241 07/01/15 0420 07/02/15 0307 07/03/15 0400  WBC 9.2 8.4 7.4 7.7  HGB 8.7* 8.7* 8.4* 8.3*  HCT 27.5* 26.9* 26.3* 25.9*  MCV 85.9 85.7 85.7 86.0  PLT 269 262 262 249    Cardiac Enzymes: No results for input(s): CKTOTAL, CKMB, CKMBINDEX, TROPONINI in the last 168 hours.  BNP: Invalid input(s):  POCBNP  CBG:  Recent Labs Lab 07/05/15 0632 07/05/15 1201 07/05/15 1611 07/05/15 2032 07/06/15 0547  GLUCAP 103* 109* 136* 92 88    Microbiology: Results for orders placed or performed during the hospital encounter of 06/09/15  Culture, respiratory (NON-Expectorated)     Status: None   Collection Time: 06/13/15 10:08 AM  Result Value Ref Range Status   Specimen Description ENDOTRACHEAL  Final   Special Requests NONE  Final   Gram Stain   Final    ABUNDANT WBC PRESENT,BOTH PMN AND MONONUCLEAR FEW SQUAMOUS EPITHELIAL CELLS PRESENT ABUNDANT GRAM NEGATIVE RODS FEW GRAM POSITIVE COCCI IN PAIRS Performed at [facility]    Culture   Final    ABUNDANT SERRATIA MARCESCENS Performed at [facility]    Report Status 06/15/2015 FINAL  Final   Organism ID, Bacteria SERRATIA MARCESCENS  Final      Susceptibility   Serratia marcescens - MIC*    CEFAZOLIN >=64 RESISTANT Resistant     CEFEPIME <=1 SENSITIVE Sensitive     CEFTAZIDIME <=1 SENSITIVE Sensitive     CEFTRIAXONE <=1 SENSITIVE Sensitive     CIPROFLOXACIN <=0.25 SENSITIVE Sensitive     GENTAMICIN <=1 SENSITIVE Sensitive     TOBRAMYCIN <=1 SENSITIVE Sensitive     TRIMETH/SULFA Value in next row Sensitive      <=20 SENSITIVE(NOTE)    *  ABUNDANT SERRATIA MARCESCENS  Culture, blood (routine x 2)     Status: None   Collection Time: 06/20/15 11:57 AM  Result Value Ref Range Status   Specimen Description BLOOD RIGHT ANTECUBITAL  Final   Special Requests BOTTLES DRAWN AEROBIC AND ANAEROBIC 10CCS  Final   Culture NO GROWTH 5 DAYS  Final   Report Status 06/25/2015 FINAL  Final  Culture, blood (single) w Reflex to ID Panel     Status: None   Collection Time: 06/20/15  1:25 PM  Result Value Ref Range Status   Specimen Description BLOOD CONFIRMED BY CLINITEST AS NEGATIVE  Final   Special Requests   Final    BOTTLES DRAWN AEROBIC AND ANAEROBIC  5CCS RIGHT IJ LINE   Culture NO GROWTH 5 DAYS  Final   Report Status  06/25/2015 FINAL  Final  Culture, blood (routine x 2)     Status: None   Collection Time: 06/20/15  1:25 PM  Result Value Ref Range Status   Specimen Description BLOOD CONFIRMED BY CLINITEST AS NEGATIVE  Final   Special Requests   Final    BOTTLES DRAWN AEROBIC AND ANAEROBIC 5CCS LEFT IJ LINE   Culture NO GROWTH 5 DAYS  Final   Report Status 06/25/2015 FINAL  Final    Coagulation Studies:  Recent Labs  07/04/15 0320 07/05/15 0325 07/06/15 0512  LABPROT 34.1* 34.4* 29.3*  INR 3.47* 3.51* 2.83*    Urinalysis: No results for input(s): COLORURINE, LABSPEC, PHURINE, GLUCOSEU, HGBUR, BILIRUBINUR, KETONESUR, PROTEINUR, UROBILINOGEN, NITRITE, LEUKOCYTESUR in the last 72 hours.  Invalid input(s): APPERANCEUR    Imaging: No results found.   Medications:   . sodium chloride 20 mL/hr at 06/24/15 2300  . sodium chloride     . alteplase  2 mg Intracatheter Once  . amLODipine  5 mg Oral Daily  . antiseptic oral rinse  7 mL Mouth Rinse q12n4p  . atorvastatin  20 mg Oral QHS  . chlorhexidine  15 mL Mouth Rinse BID  . darbepoetin (ARANESP) injection - NON-DIALYSIS  150 mcg Subcutaneous Q Wed-1800  . docusate sodium  100 mg Oral BID  . feeding supplement (NEPRO CARB STEADY)  237 mL Oral BID BM  . insulin aspart  0-9 Units Subcutaneous TID WC  . labetalol  200 mg Oral BID  . sodium bicarbonate  650 mg Oral TID  . sodium chloride flush  10-40 mL Intracatheter Q12H  . tamsulosin  0.4 mg Oral QPC supper  . Warfarin - Pharmacist Dosing Inpatient   Does not apply q1800   acetaminophen **OR** acetaminophen, bisacodyl, fentaNYL (SUBLIMAZE) injection, heparin, hydrALAZINE, ipratropium, levalbuterol, ondansetron (ZOFRAN) IV, oxyCODONE-acetaminophen, sodium chloride flush, zolpidem  Assessment/ Plan:  1. A on CRF- crt of 1.27 on 2/2- AKI in the setting of a chronically nonfunctional right kdney and hemodynamic changes associated with AAA repair and left renal artery bypass on 2/6. required  CRRT 2/9- 2/13- then IHD 2/15 , 2/17 2/20 , 2/22 and 2/24- averaging about 500-600 of urine q day but had not been able to pass - s/p foley so we can keep track but also to spare him the repeated in and out caths Creatinine down today 2. HTN/vol- Improved Changed metoprolol to labetalol 3. Anemia- due to surgery and CKD- - started aranesp and transfuse as needed- replete iron as well 4. S./p AAA repair and renal artery bypass - per VVS 5. Bones- phos down with HD only- PTH 92 6. Hypokalemia- Stable 7. GI issues- requiring TNA but  now weaned off- albumin quite poor 8. BOO- several days of not being able to void- on flomax and placed foley on 2/25  Creatinine is improved and patient wants to leave     LOS: 27 [name] [name] W @TODAY @9 :01 AM

## 2015-07-06 NOTE — Progress Notes (Signed)
ANTICOAGULATION CONSULT NOTE - Follow Up Consult  Pharmacy Consult for Coumadin Indication: atrial fibrillation and stroke  No Known Allergies  Patient Measurements: Height: 6' (182.9 cm) Weight: 183 lb 12.8 oz (83.371 kg) IBW/kg (Calculated) : 77.6  Vital Signs: Temp: 97.8 F (36.6 C) (03/05 0505) Temp Source: Oral (03/05 0505) BP: 158/80 mmHg (03/05 0505) Pulse Rate: 103 (03/05 0505)  Labs:  Recent Labs  07/04/15 0320 07/05/15 0325 07/06/15 0512  LABPROT 34.1* 34.4* 29.3*  INR 3.47* 3.51* 2.83*  CREATININE 12.15* 12.13* 11.33*    Estimated Creatinine Clearance: 7.4 mL/min (by C-G formula based on Cr of 11.33).  Assessment: 64yom on coumadin pta for afib and hx stroke. He underwent elective repair of his AAA along with left renal artery bypass on 2/6. Post-op course complicated by renal failure, first requiring CRRT, now on iHD. He had an HD tunneled catheter placed 2/23. Coumadin resumed 2/24. INR finally back within goal range at 2.83 after holding for several days.  Home dose: 5mg  daily except 7.5mg  on Wed/Thurs  Goal of Therapy:  INR 2-3 Monitor platelets by anticoagulation protocol: Yes   Plan:  1) Resume coumadin 7.5mg  x 1 tonight 2) Daily INR  Deboraha Sprang 07/06/2015,11:02 AM

## 2015-07-07 LAB — RENAL FUNCTION PANEL
Albumin: 1.9 g/dL — ABNORMAL LOW (ref 3.5–5.0)
Anion gap: 18 — ABNORMAL HIGH (ref 5–15)
BUN: 109 mg/dL — ABNORMAL HIGH (ref 6–20)
CO2: 20 mmol/L — ABNORMAL LOW (ref 22–32)
Calcium: 8.6 mg/dL — ABNORMAL LOW (ref 8.9–10.3)
Chloride: 103 mmol/L (ref 101–111)
Creatinine, Ser: 10.73 mg/dL — ABNORMAL HIGH (ref 0.61–1.24)
GFR calc Af Amer: 5 mL/min — ABNORMAL LOW (ref >60)
GFR calc non Af Amer: 4 mL/min — ABNORMAL LOW (ref >60)
Glucose, Bld: 128 mg/dL — ABNORMAL HIGH (ref 65–99)
Phosphorus: 8.2 mg/dL — ABNORMAL HIGH (ref 2.5–4.6)
Potassium: 4.1 mmol/L (ref 3.5–5.1)
Sodium: 141 mmol/L (ref 135–145)

## 2015-07-07 LAB — PROTIME-INR
INR: 3.07 — ABNORMAL HIGH (ref 0.00–1.49)
Prothrombin Time: 31.1 seconds — ABNORMAL HIGH (ref 11.6–15.2)

## 2015-07-07 LAB — GLUCOSE, CAPILLARY
Glucose-Capillary: 101 mg/dL — ABNORMAL HIGH (ref 65–99)
Glucose-Capillary: 125 mg/dL — ABNORMAL HIGH (ref 65–99)

## 2015-07-07 LAB — MAGNESIUM: Magnesium: 1.4 mg/dL — ABNORMAL LOW (ref 1.7–2.4)

## 2015-07-07 MED ORDER — SODIUM BICARBONATE 650 MG PO TABS: 650.0000 mg | ORAL_TABLET | Freq: Three times a day (TID) | ORAL | Status: DC

## 2015-07-07 MED ORDER — OXYCODONE-ACETAMINOPHEN 5-325 MG PO TABS: 1.0000 | ORAL_TABLET | Freq: Four times a day (QID) | ORAL | Status: DC | PRN

## 2015-07-07 MED ORDER — NEPRO/CARBSTEADY PO LIQD: 237.0000 mL | Freq: Two times a day (BID) | ORAL | Status: DC

## 2015-07-07 MED ORDER — PANTOPRAZOLE SODIUM 40 MG PO TBEC
40.0000 mg | DELAYED_RELEASE_TABLET | Freq: Every day | ORAL | Status: DC
  Administered 2015-07-07: 40 mg via ORAL

## 2015-07-07 MED ORDER — TAMSULOSIN HCL 0.4 MG PO CAPS: 0.4000 mg | ORAL_CAPSULE | Freq: Every day | ORAL | Status: DC

## 2015-07-07 MED ORDER — MAGNESIUM SULFATE 2 GM/50ML IV SOLN
2.0000 g | Freq: Once | INTRAVENOUS | Status: AC
  Administered 2015-07-07: 2 g via INTRAVENOUS
  Filled 2015-07-07: qty 50

## 2015-07-07 MED ORDER — LABETALOL HCL 200 MG PO TABS: 200.0000 mg | ORAL_TABLET | Freq: Two times a day (BID) | ORAL | Status: DC

## 2015-07-07 NOTE — Progress Notes (Signed)
Asked to remove TDC prior to discharge, however, pt is on Coumadin and INR is > 3.  I have spoken with Colletta Maryland in our office to have it coordinated to have catheter out next Monday.  He will stop his coumadin after tomorrow's dose.  He will have it checked on Friday to assure it is trending downward.  It will also be checked again here on Monday when he arrives.    Catheter was placed by Dr. Scot Dock on 06/26/15 - left IJ.   Kameron Blethen, [facility] 07/07/2015 11:38 AM

## 2015-07-07 NOTE — Care Management Note (Addendum)
Case Management Note  Patient Details  Name: [name] [name] MRN: [id number] Date of Birth: 08-Aug-1950  Subjective/Objective:      Abdominal aortic aneurysm               Action/Plan: NCM spoke to pt and wife, Vaughan Basta at bedside. Pt requesting RW with seat for home. Contacted [facility] DME rep for RW with seat. Instructed pt to pick up from [facility] store located on [address] Contacted MD for order for RW with seat. Will fax to once received. Pt declines Home Health at this time.   [place] NCM spoke to pt's wife, Vaughan Basta. States she prefers to pick up from [facility]. NCM contacted [place] and faxed orders for RW with seat to # [phone].  Expected Discharge Date:  07/07/2015               Expected Discharge Plan:  [place]  In-House Referral:  NA  Discharge planning Services  CM Consult  Post Acute Care Choice:  Durable Medical Equipment Choice offered to:     DME Arranged:  [place] rolling with seat DME Agency:  [place] Arranged:  NA HH Agency:  NA  Status of Service:  Completed, signed off  Medicare Important Message Given:  Yes Date Medicare IM Given:    Medicare IM give by:    Date Additional Medicare IM Given:    Additional Medicare Important Message give by:     If discussed at [place] of Stay Meetings, dates discussed:    Additional Comments:  Erenest Rasher, RN 07/07/2015, 12:03 PM

## 2015-07-07 NOTE — Progress Notes (Signed)
Occupational Therapy Treatment Patient Details Name: [name] [name] MRN: [id number] DOB: October 20, 1950 Today's Date: 07/07/2015    History of present illness 65 yo Male with PMH significant for HTN, Atrial Fib on warfarin, CVA, and known AAA.   Underwent arterial aortogram with lower extremity 1/25 which discovered occluded R renal artery moderate calcific stenosis and bilateral iliac arteries right greater than left, and diseased suprarenal aorta. He was admitted on 06/09/15 for open repair of infrarenal aorta with L renal artery bypass and possible R renal artery bypass.   OT comments  Pt. Seen for grooming tasks and educated on energy conservation and walker safety while in standing.  Wife present for session.  States home has been fully modified for walker use throughout home along with shower stall modifications with shower seat, grab bars, and hand held shower head.  Both eager for d/c home when able.    Follow Up Recommendations  Home health OT;Supervision/Assistance - 24 hour    Equipment Recommendations       Recommendations for Other Services      Precautions / Restrictions Precautions Precautions: Fall Precaution Comments: CVVHD transitioned to HD on 06/17/15 Restrictions Weight Bearing Restrictions: No       Mobility Bed Mobility Overal bed mobility: Modified Independent Bed Mobility: Sit to Supine              Transfers Overall transfer level: Needs assistance Equipment used: Rolling walker (2 wheeled) Transfers: Sit to/from [facility] Sit to Stand: Min guard Stand pivot transfers: Min guard       General transfer comment: cues for walker safety/management and hand placement on arm rests for sit/stand    Balance                                   ADL Overall ADL's : Needs assistance/impaired     Grooming: Wash/dry hands;Wash/dry face;Oral care;Min guard;Standing                   Toilet Transfer: Min  guard;Ambulation;RW;Comfort height toilet;Grab bars   Toileting- Clothing Manipulation and Hygiene: Min guard;Sit to/from Nurse, children's Details (indicate cue type and reason): wife present and reports shower stall, small ledge, built in chair, grab bars, and hand held shower head.  states it is set up for him.  both declined practice of this goal/transfer Functional mobility during ADLs: Min guard;Rolling walker General ADL Comments: education to pt. and spouse for walker management during standing tasks along with energy conservation tech. ie: sit when able for task completetion.  pt. initiated rest breaks appropriately without cues.  wife states all walking paths have been cleared in the home.  has all DME except rw      Vision                     Perception     Praxis      Cognition   Behavior During Therapy: [facility] for tasks assessed/performed                         Extremity/Trunk Assessment               Exercises     Shoulder Instructions       General Comments      Pertinent Vitals/ Pain       Pain Assessment:  (did  not rate but states back pain, requests back to bed)  Home Living                                          Prior Functioning/Environment              Frequency Min 2X/week     Progress Toward Goals  OT Goals(current goals can now be found in the care plan section)  Progress towards OT goals: Progressing toward goals     Plan Discharge plan remains appropriate    Co-evaluation                 End of Session Equipment Utilized During Treatment: Rolling walker   Activity Tolerance Patient tolerated treatment well   Patient Left in bed;with call bell/phone within reach;with family/visitor present   Nurse Communication          Time: [id number] OT Time Calculation (min): 10 min  Charges: OT General Charges $OT Visit: 1 Procedure OT Treatments $Self Care/Home  Management : 8-22 mins  Janice Coffin, COTA/L 07/07/2015, 8:49 AM

## 2015-07-07 NOTE — Progress Notes (Addendum)
[facility] Brief Note  OK for discharge today Dr. Justin Mend will be following labs as outpt - plans lab draw on Thursday Needs TDC removed by IR prior to discharge Continue sodium bicarb - Dr. Justin Mend will determine if needs to continue based on next labs  Jamal Maes, MD [place] Pager 07/07/2015, 11:17 AM

## 2015-07-07 NOTE — Progress Notes (Signed)
Spoke to Dr. Lorrene Reid with nephrology regarding pt discharge.  She advised for pt to continue bicarb at home and to continue labetalol (per [facility] with vascular's requests).  Aranesp should be discontinued until pt seen in outpatient setting, otherwise continue all meds pt has been receiving here.  Dr. Lorrene Reid said that pt is clear to have tunneled dialysis cath D/C today, IR notified.  Lorrene Reid asked me to contact Dr. Justin Mend regarding when pt to have outpatient labs drawn.  Per Dr. Justin Mend, pt to contact office to schedule draw on Thursday.

## 2015-07-07 NOTE — Progress Notes (Addendum)
  Progress Note    07/07/2015 7:43 AM 11 Days Post-Op  Subjective:  Ready to go home  Afebrile HR 90's Afib [id number] systolic 0000000 RA  Filed Vitals:   07/06/15 2119 07/07/15 0526  BP: 175/86 149/88  Pulse: 99 63  Temp: 98 F (36.7 C) 97.7 F (36.5 C)  Resp: 18 18    Physical Exam: Lungs:  Non labored Incisions:  Healing nicely Extremities:  Bilateral feet are warm and well perfused.  CBC    Component Value Date/Time   WBC 7.7 07/03/2015 0400   RBC 3.01* 07/03/2015 0400   HGB 8.3* 07/03/2015 0400   HCT 25.9* 07/03/2015 0400   PLT 249 07/03/2015 0400   MCV 86.0 07/03/2015 0400   MCH 27.6 07/03/2015 0400   MCHC 32.0 07/03/2015 0400   RDW 15.9* 07/03/2015 0400   LYMPHSABS 0.9 06/23/2015 0250   MONOABS 1.2* 06/23/2015 0250   EOSABS 0.1 06/23/2015 0250   BASOSABS 0.0 06/23/2015 0250    BMET    Component Value Date/Time   NA 141 07/07/2015 0241   K 4.1 07/07/2015 0241   CL 103 07/07/2015 0241   CO2 20* 07/07/2015 0241   GLUCOSE 128* 07/07/2015 0241   BUN 109* 07/07/2015 0241   CREATININE 10.73* 07/07/2015 0241   CREATININE 1.10 11/07/2014 0804   CALCIUM 8.6* 07/07/2015 0241   GFRNONAA 4* 07/07/2015 0241   GFRNONAA 53* 05/14/2013 0905   GFRAA 5* 07/07/2015 0241   GFRAA 62 05/14/2013 0905    INR    Component Value Date/Time   INR 3.07* 07/07/2015 0241   INR 3.6 03/10/2015 0828   INR 1.9 07/20/2010 0817     Intake/Output Summary (Last 24 hours) at 07/07/15 0743 Last data filed at 07/07/15 0548  Gross per 24 hour  Intake    476 ml  Output   1450 ml  Net   -974 ml     Assessment:  65 y.o. male is s/p:  repair of juxtarenal AAA, left renal bypass, pancreatitis, AKI   28 Days Post-Op And  TDC placement  11 Days Post-Op  Plan: -pt doing well  -creatinine continues to improve and is down to 10.3 today with ~1.5L urine out.  Foley out yesterday. -INR is trending b/w 2.8 and 3.5 (3.07 today). -DVT prophylaxis:  coumadin -Would appreciate  renal's input on follow up with you! Is it still recommended to change the metoprolol (home med) to new labatolol (new med) for discharge??  Continue Bicarb po?   Leontine Locket, PA-C [facility] [phone] 07/07/2015 7:43 AM  Agree with above.   Will plan to d/c home today if ok with Nephrology service.  He will need follow up with them Follow up with me in 1 month Hypomagnesemia replete prior to d/c  Ruta Hinds, MD [facility] [place]: [phone] Pager: [phone]

## 2015-07-07 NOTE — Progress Notes (Signed)
ANTICOAGULATION CONSULT NOTE - Follow Up Consult  Pharmacy Consult for Coumadin Indication: atrial fibrillation and stroke  No Known Allergies  Patient Measurements: Height: 6' (182.9 cm) Weight: 181 lb 9.6 oz (82.373 kg) IBW/kg (Calculated) : 77.6  Vital Signs: Temp: 97.7 F (36.5 C) (03/06 0526) Temp Source: Oral (03/06 0526) BP: 149/88 mmHg (03/06 0526) Pulse Rate: 63 (03/06 0526)  Labs:  Recent Labs  07/05/15 0325 07/06/15 0512 07/07/15 0241  LABPROT 34.4* 29.3* 31.1*  INR 3.51* 2.83* 3.07*  CREATININE 12.13* 11.33* 10.73*    Estimated Creatinine Clearance: 7.8 mL/min (by C-G formula based on Cr of 10.73).  Assessment: 64yom on coumadin pta for afib and hx stroke. He underwent elective repair of his AAA along with left renal artery bypass on 2/6. Post-op course complicated by renal failure, first requiring CRRT, now on iHD. He had an HD tunneled catheter placed 2/23. Coumadin resumed 2/24. INR was back within goal range at 2.83 yesterday after holding for several days. Today is slightly supratherapeutic to 3.07 after 7.5 mg PO x1 yesterday.  Home dose: 5mg  daily except 7.5mg  on Wed/Thurs  Goal of Therapy:  INR 2-3 Monitor platelets by anticoagulation protocol: Yes   Plan:  1) Hold coumadin tonight 2) Daily INR  Joya San, PharmD Clinical Pharmacy Resident Pager # [phone] 07/07/2015 8:50 AM

## 2015-07-07 NOTE — Progress Notes (Signed)
Pt cleared for D/C.  IV and Telemetry removed, CCMD notified.

## 2015-07-07 NOTE — Progress Notes (Signed)
Physical Therapy Treatment Patient Details Name: [name] [name] MRN: [id number] DOB: October 23, 1950 Today's Date: 07/07/2015    History of Present Illness 65 yo Male with PMH significant for HTN, Atrial Fib on warfarin, CVA, and known AAA.   Underwent arterial aortogram with lower extremity 1/25 which discovered occluded R renal artery moderate calcific stenosis and bilateral iliac arteries right greater than left, and diseased suprarenal aorta. He was admitted on 06/09/15 for open repair of infrarenal aorta with L renal artery bypass and possible R renal artery bypass.    PT Comments    Patient progressing close to goal for mobility.  Planned d/c today home with wife assist, who has assisted him with mobility in the room.  Agree with HHPT at d/c.  Follow Up Recommendations  Supervision/Assistance - 24 hour;Home health PT     Equipment Recommendations  Rolling walker with 5" wheels    Recommendations for Other Services       Precautions / Restrictions Precautions Precautions: Fall    Mobility  Bed Mobility Overal bed mobility: Modified Independent             General bed mobility comments: used rail.    Transfers Overall transfer level: Needs assistance Equipment used: Rolling walker (2 wheeled) Transfers: Sit to/from Stand Sit to Stand: Supervision         General transfer comment: assist for safety  Ambulation/Gait Ambulation/Gait assistance: Supervision Ambulation Distance (Feet): 250 Feet Assistive device: Rolling walker (2 wheeled) Gait Pattern/deviations: Step-through pattern;Decreased stride length     General Gait Details: safe with use of walker for ambulation in open environment   Stairs Stairs: Yes Stairs assistance: Min assist Stair Management: One rail Right;Step to pattern;Sideways Number of Stairs: 6 General stair comments: performed step to sequence then pt wanted to back down steps. cues for coming sideways to visualize steps, but pt reported  anxiety about heights and reverted to backwards technique; sat to rest on step after finishing with HR 118.  Wheelchair Mobility    Modified Rankin (Stroke Patients Only)       Balance Overall balance assessment: Needs assistance         Standing balance support: No upper extremity supported Standing balance-Leahy Scale: Fair Standing balance comment: safe with static balance, needs support for dynamic reaching and safety with gait                    Cognition Arousal/Alertness: Awake/alert Behavior During Therapy: WFL for tasks assessed/performed Overall Cognitive Status: Within Functional Limits for tasks assessed                      Exercises Other Exercises Other Exercises: sit to stand x 5 no UE support    General Comments        Pertinent Vitals/Pain Pain Assessment: No/denies pain    Home Living                      Prior Function            PT Goals (current goals can now be found in the care plan section) Progress towards PT goals: Progressing toward goals    Frequency  Min 3X/week    PT Plan Current plan remains appropriate    Co-evaluation             End of Session Equipment Utilized During Treatment: Gait belt Activity Tolerance: Patient tolerated treatment well Patient left: in bed;with call bell/phone  within reach     Time: 1045-1105 PT Time Calculation (min) (ACUTE ONLY): 20 min  Charges:  $Gait Training: 8-22 mins                    G Codes:      Reginia Naas 07-15-15, 5:14 PM  Magda Kiel, [place] 07-15-2015

## 2015-07-08 ENCOUNTER — Telehealth: Payer: Self-pay | Admitting: Vascular Surgery

## 2015-07-08 ENCOUNTER — Other Ambulatory Visit: Payer: Self-pay | Admitting: *Deleted

## 2015-07-08 ENCOUNTER — Telehealth: Payer: Self-pay | Admitting: Family Medicine

## 2015-07-08 MED ORDER — PANTOPRAZOLE SODIUM 40 MG PO TBEC: 40.0000 mg | DELAYED_RELEASE_TABLET | Freq: Every day | ORAL | Status: DC

## 2015-07-08 NOTE — Telephone Encounter (Signed)
Patients home # is disconnected, cell # is not accepting calls. Mailed letter, dpm

## 2015-07-08 NOTE — Telephone Encounter (Signed)
-----   Message from Mena Goes, RN sent at 07/07/2015  9:32 AM EST ----- Regarding: schedule   ----- Message -----    From: Gabriel Earing, PA-C    Sent: 07/07/2015   9:06 AM      To: [facility] Charge Pool  Please have this pt f/u in 4 weeks with Dr. Oneida Alar.  He has been in the hospital for 4 weeks.  He will see him back in 4 weeks.  Thanks, Aldona Bar

## 2015-07-08 NOTE — Telephone Encounter (Signed)
Prescription sent to pharmacy.

## 2015-07-08 NOTE — Telephone Encounter (Signed)
[name] [name] wife of patient requesting a refill to be sent to [facility] in [place] pantoprazole (PROTONIX) 40 MG tablet   CB# [phone]

## 2015-07-09 ENCOUNTER — Encounter (HOSPITAL_COMMUNITY): Payer: Self-pay | Admitting: *Deleted

## 2015-07-09 ENCOUNTER — Inpatient Hospital Stay (HOSPITAL_COMMUNITY)
Admission: EM | Admit: 2015-07-09 | Discharge: 2015-07-21 | DRG: 641 | Disposition: A | Discharge status: Home Health | Payer: Medicare Other | Attending: Vascular Surgery | Admitting: Vascular Surgery

## 2015-07-09 ENCOUNTER — Emergency Department (HOSPITAL_COMMUNITY): Payer: Medicare Other

## 2015-07-09 DIAGNOSIS — I5042 Chronic combined systolic (congestive) and diastolic (congestive) heart failure: Secondary | ICD-10-CM | POA: Diagnosis present

## 2015-07-09 DIAGNOSIS — Z8547 Personal history of malignant neoplasm of testis: Secondary | ICD-10-CM | POA: Diagnosis not present

## 2015-07-09 DIAGNOSIS — E46 Unspecified protein-calorie malnutrition: Secondary | ICD-10-CM | POA: Diagnosis present

## 2015-07-09 DIAGNOSIS — I5022 Chronic systolic (congestive) heart failure: Secondary | ICD-10-CM | POA: Diagnosis not present

## 2015-07-09 DIAGNOSIS — E872 Acidosis: Secondary | ICD-10-CM | POA: Diagnosis present

## 2015-07-09 DIAGNOSIS — M6281 Muscle weakness (generalized): Secondary | ICD-10-CM | POA: Diagnosis not present

## 2015-07-09 DIAGNOSIS — Z79899 Other long term (current) drug therapy: Secondary | ICD-10-CM | POA: Diagnosis not present

## 2015-07-09 DIAGNOSIS — T819XXA Unspecified complication of procedure, initial encounter: Secondary | ICD-10-CM | POA: Diagnosis not present

## 2015-07-09 DIAGNOSIS — Z7901 Long term (current) use of anticoagulants: Secondary | ICD-10-CM | POA: Diagnosis not present

## 2015-07-09 DIAGNOSIS — I4891 Unspecified atrial fibrillation: Secondary | ICD-10-CM | POA: Insufficient documentation

## 2015-07-09 DIAGNOSIS — Z955 Presence of coronary angioplasty implant and graft: Secondary | ICD-10-CM

## 2015-07-09 DIAGNOSIS — I129 Hypertensive chronic kidney disease with stage 1 through stage 4 chronic kidney disease, or unspecified chronic kidney disease: Secondary | ICD-10-CM | POA: Diagnosis present

## 2015-07-09 DIAGNOSIS — I2510 Atherosclerotic heart disease of native coronary artery without angina pectoris: Secondary | ICD-10-CM | POA: Diagnosis not present

## 2015-07-09 DIAGNOSIS — E876 Hypokalemia: Secondary | ICD-10-CM | POA: Diagnosis not present

## 2015-07-09 DIAGNOSIS — N184 Chronic kidney disease, stage 4 (severe): Secondary | ICD-10-CM | POA: Diagnosis present

## 2015-07-09 DIAGNOSIS — R072 Precordial pain: Secondary | ICD-10-CM | POA: Diagnosis not present

## 2015-07-09 DIAGNOSIS — E860 Dehydration: Principal | ICD-10-CM | POA: Diagnosis present

## 2015-07-09 DIAGNOSIS — R935 Abnormal findings on diagnostic imaging of other abdominal regions, including retroperitoneum: Secondary | ICD-10-CM | POA: Diagnosis not present

## 2015-07-09 DIAGNOSIS — D649 Anemia, unspecified: Secondary | ICD-10-CM | POA: Diagnosis present

## 2015-07-09 DIAGNOSIS — K219 Gastro-esophageal reflux disease without esophagitis: Secondary | ICD-10-CM | POA: Diagnosis present

## 2015-07-09 DIAGNOSIS — Z6823 Body mass index (BMI) 23.0-23.9, adult: Secondary | ICD-10-CM | POA: Diagnosis not present

## 2015-07-09 DIAGNOSIS — E785 Hyperlipidemia, unspecified: Secondary | ICD-10-CM | POA: Diagnosis present

## 2015-07-09 DIAGNOSIS — I25119 Atherosclerotic heart disease of native coronary artery with unspecified angina pectoris: Secondary | ICD-10-CM | POA: Diagnosis present

## 2015-07-09 DIAGNOSIS — R933 Abnormal findings on diagnostic imaging of other parts of digestive tract: Secondary | ICD-10-CM | POA: Diagnosis not present

## 2015-07-09 DIAGNOSIS — R112 Nausea with vomiting, unspecified: Secondary | ICD-10-CM | POA: Diagnosis present

## 2015-07-09 DIAGNOSIS — I482 Chronic atrial fibrillation: Secondary | ICD-10-CM | POA: Diagnosis present

## 2015-07-09 DIAGNOSIS — F17200 Nicotine dependence, unspecified, uncomplicated: Secondary | ICD-10-CM | POA: Diagnosis present

## 2015-07-09 DIAGNOSIS — Z95820 Peripheral vascular angioplasty status with implants and grafts: Secondary | ICD-10-CM

## 2015-07-09 DIAGNOSIS — R110 Nausea: Secondary | ICD-10-CM | POA: Diagnosis not present

## 2015-07-09 DIAGNOSIS — I252 Old myocardial infarction: Secondary | ICD-10-CM | POA: Diagnosis not present

## 2015-07-09 DIAGNOSIS — I255 Ischemic cardiomyopathy: Secondary | ICD-10-CM | POA: Diagnosis present

## 2015-07-09 DIAGNOSIS — R1110 Vomiting, unspecified: Secondary | ICD-10-CM | POA: Diagnosis not present

## 2015-07-09 DIAGNOSIS — A047 Enterocolitis due to Clostridium difficile: Secondary | ICD-10-CM | POA: Diagnosis present

## 2015-07-09 DIAGNOSIS — I10 Essential (primary) hypertension: Secondary | ICD-10-CM | POA: Diagnosis not present

## 2015-07-09 LAB — URINALYSIS, ROUTINE W REFLEX MICROSCOPIC
Bilirubin Urine: NEGATIVE
Glucose, UA: 250 mg/dL — AB
Ketones, ur: NEGATIVE mg/dL
Leukocytes, UA: NEGATIVE
Nitrite: NEGATIVE
Protein, ur: 100 mg/dL — AB
Specific Gravity, Urine: 1.014 (ref 1.005–1.030)
pH: 7.5 (ref 5.0–8.0)

## 2015-07-09 LAB — COMPREHENSIVE METABOLIC PANEL
ALT: 16 U/L — ABNORMAL LOW (ref 17–63)
ALT: 16 U/L — ABNORMAL LOW (ref 17–63)
AST: 10 U/L — ABNORMAL LOW (ref 15–41)
AST: 11 U/L — ABNORMAL LOW (ref 15–41)
Albumin: 2.3 g/dL — ABNORMAL LOW (ref 3.5–5.0)
Albumin: 2.3 g/dL — ABNORMAL LOW (ref 3.5–5.0)
Alkaline Phosphatase: 85 U/L (ref 38–126)
Alkaline Phosphatase: 84 U/L (ref 38–126)
Anion gap: 16 — ABNORMAL HIGH (ref 5–15)
Anion gap: 14 (ref 5–15)
BUN: 81 mg/dL — ABNORMAL HIGH (ref 6–20)
BUN: 81 mg/dL — ABNORMAL HIGH (ref 6–20)
CO2: 19 mmol/L — ABNORMAL LOW (ref 22–32)
CO2: 20 mmol/L — ABNORMAL LOW (ref 22–32)
Calcium: 9.2 mg/dL (ref 8.9–10.3)
Calcium: 9.2 mg/dL (ref 8.9–10.3)
Chloride: 103 mmol/L (ref 101–111)
Chloride: 104 mmol/L (ref 101–111)
Creatinine, Ser: 8.34 mg/dL — ABNORMAL HIGH (ref 0.61–1.24)
Creatinine, Ser: 8.31 mg/dL — ABNORMAL HIGH (ref 0.61–1.24)
GFR calc Af Amer: 7 mL/min — ABNORMAL LOW (ref >60)
GFR calc Af Amer: 7 mL/min — ABNORMAL LOW (ref >60)
GFR calc non Af Amer: 6 mL/min — ABNORMAL LOW (ref >60)
GFR calc non Af Amer: 6 mL/min — ABNORMAL LOW (ref >60)
Glucose, Bld: 120 mg/dL — ABNORMAL HIGH (ref 65–99)
Glucose, Bld: 118 mg/dL — ABNORMAL HIGH (ref 65–99)
Potassium: 3.5 mmol/L (ref 3.5–5.1)
Potassium: 3.5 mmol/L (ref 3.5–5.1)
Sodium: 138 mmol/L (ref 135–145)
Sodium: 138 mmol/L (ref 135–145)
Total Bilirubin: 0.5 mg/dL (ref 0.3–1.2)
Total Bilirubin: 0.6 mg/dL (ref 0.3–1.2)
Total Protein: 7.8 g/dL (ref 6.5–8.1)
Total Protein: 7.7 g/dL (ref 6.5–8.1)

## 2015-07-09 LAB — AMYLASE: Amylase: 46 U/L (ref 28–100)

## 2015-07-09 LAB — CBC
HCT: 28.9 % — ABNORMAL LOW (ref 39.0–52.0)
Hemoglobin: 9.4 g/dL — ABNORMAL LOW (ref 13.0–17.0)
MCH: 28.2 pg (ref 26.0–34.0)
MCHC: 32.5 g/dL (ref 30.0–36.0)
MCV: 86.8 fL (ref 78.0–100.0)
Platelets: 303 10*3/uL (ref 150–400)
RBC: 3.33 MIL/uL — ABNORMAL LOW (ref 4.22–5.81)
RDW: 15.2 % (ref 11.5–15.5)
WBC: 7.6 10*3/uL (ref 4.0–10.5)

## 2015-07-09 LAB — LIPASE, BLOOD
Lipase: 61 U/L — ABNORMAL HIGH (ref 11–51)
Lipase: 61 U/L — ABNORMAL HIGH (ref 11–51)

## 2015-07-09 LAB — URINE MICROSCOPIC-ADD ON: Squamous Epithelial / LPF: NONE SEEN

## 2015-07-09 LAB — PROTIME-INR
INR: 3.29 — ABNORMAL HIGH (ref 0.00–1.49)
Prothrombin Time: 32.8 seconds — ABNORMAL HIGH (ref 11.6–15.2)

## 2015-07-09 MED ORDER — ONDANSETRON 4 MG PO TBDP
ORAL_TABLET | ORAL | Status: AC
  Filled 2015-07-09: qty 1

## 2015-07-09 MED ORDER — PANTOPRAZOLE SODIUM 40 MG PO TBEC
40.0000 mg | DELAYED_RELEASE_TABLET | Freq: Every day | ORAL | Status: DC
  Administered 2015-07-09 – 2015-07-16 (×8): 40 mg via ORAL
  Filled 2015-07-09 (×8): qty 1

## 2015-07-09 MED ORDER — ONDANSETRON 4 MG PO TBDP
4.0000 mg | ORAL_TABLET | Freq: Once | ORAL | Status: AC | PRN
  Administered 2015-07-09: 4 mg via ORAL

## 2015-07-09 MED ORDER — HYDRALAZINE HCL 20 MG/ML IJ SOLN
5.0000 mg | INTRAMUSCULAR | Status: AC | PRN
  Administered 2015-07-10 – 2015-07-12 (×2): 5 mg via INTRAVENOUS
  Filled 2015-07-09 (×2): qty 1

## 2015-07-09 MED ORDER — BISACODYL 10 MG RE SUPP
10.0000 mg | Freq: Every day | RECTAL | Status: DC | PRN
Start: 2015-07-09 — End: 2015-07-21

## 2015-07-09 MED ORDER — DOCUSATE SODIUM 100 MG PO CAPS
100.0000 mg | ORAL_CAPSULE | Freq: Two times a day (BID) | ORAL | Status: DC
  Administered 2015-07-09 – 2015-07-11 (×4): 100 mg via ORAL
  Filled 2015-07-09 (×13): qty 1

## 2015-07-09 MED ORDER — LABETALOL HCL 5 MG/ML IV SOLN
10.0000 mg | INTRAVENOUS | Status: AC | PRN
  Administered 2015-07-10 – 2015-07-12 (×4): 10 mg via INTRAVENOUS
  Filled 2015-07-09 (×4): qty 4

## 2015-07-09 MED ORDER — METOPROLOL TARTRATE 1 MG/ML IV SOLN: 2.0000 mg | INTRAVENOUS | Status: DC | PRN

## 2015-07-09 MED ORDER — GUAIFENESIN-DM 100-10 MG/5ML PO SYRP: 15.0000 mL | ORAL_SOLUTION | ORAL | Status: DC | PRN

## 2015-07-09 MED ORDER — MORPHINE SULFATE (PF) 2 MG/ML IV SOLN
2.0000 mg | INTRAVENOUS | Status: DC | PRN
  Administered 2015-07-10: 2 mg via INTRAVENOUS
  Filled 2015-07-09: qty 1

## 2015-07-09 MED ORDER — DEXTROSE-NACL 5-0.45 % IV SOLN
INTRAVENOUS | Status: DC
  Administered 2015-07-09 – 2015-07-15 (×12): via INTRAVENOUS

## 2015-07-09 MED ORDER — ONDANSETRON HCL 4 MG/2ML IJ SOLN
4.0000 mg | Freq: Four times a day (QID) | INTRAMUSCULAR | Status: DC | PRN
  Administered 2015-07-10 – 2015-07-12 (×5): 4 mg via INTRAVENOUS
  Filled 2015-07-09 (×5): qty 2

## 2015-07-09 MED ORDER — ACETAMINOPHEN 325 MG PO TABS
325.0000 mg | ORAL_TABLET | ORAL | Status: DC | PRN
  Administered 2015-07-10 (×2): 650 mg via ORAL
  Filled 2015-07-09 (×2): qty 2

## 2015-07-09 MED ORDER — PHENOL 1.4 % MT LIQD: 1.0000 | OROMUCOSAL | Status: DC | PRN

## 2015-07-09 MED ORDER — ACETAMINOPHEN 325 MG RE SUPP
325.0000 mg | RECTAL | Status: DC | PRN
  Filled 2015-07-09: qty 2

## 2015-07-09 NOTE — ED Provider Notes (Signed)
CSN: [id number]     Arrival date & time 07/09/15  0940 History   First MD Initiated Contact with Patient 07/09/15 1102     Chief Complaint  Patient presents with  . Emesis      HPI  She presents for evaluation of nausea and vomiting. Recent abdominal aortic an usual repair with renal artery bypass by Dr. Oneida Alar. Doubt renal failure as an inpatient. Underwent upper dialysis. Was making urine. He was discharged home yesterday. Also worsening symptoms of repeat nausea and vomiting at home. Contacted Dr. Oneida Alar office. Was referred back here for admission. Seen by myself, as well as Dr. fields upon his arrival here.  Fever. Continuing to make urine. Nausea and vomiting. No diarrhea.  Past Medical History  Diagnosis Date  . Arteriosclerotic cardiovascular disease (ASCVD)     AMI in 2000 treated at [facility]; cath in 12/2006->  Chronic total obstruction of the RCA;  drug-eluting stent placed in M1; inferior hypokinesis with an EF of 45%  . Tobacco abuse, in remission     20 pack years; quit in 2009  . Hypertension   . Chronic anticoagulation   . Permanent atrial fibrillation (HCC)     on coumadin for couple of years, stopped plavix at that time  . PVD (peripheral vascular disease) ([place])     Ct angiogram in 2009 revealed stable disease with 80% celiac stenosis,50% right renal artery ,ASCVD with ulceration in the abdominal aortashe  . Nephrolithiasis   . Cerebrovascular disease 2002    carotid stent  . Hyperlipidemia   . Myocardial infarction ([place]) 10 yrs ago  . Testicular carcinoma ([place]) 1990    right orchiectomy  . AAA (abdominal aortic aneurysm) ([place]) 2017    3.3cm  . GERD (gastroesophageal reflux disease)   . Chronic combined systolic and diastolic CHF (congestive heart failure) [facility])    Past Surgical History  Procedure Laterality Date  . Appendectomy  2004  . Testicular cancer  1990    right orchiectomy  . Colonoscopy  06/17/2011    INCOMPLETE, PREP POOR. Procedure: COLONOSCOPY;   Surgeon: Daneil Dolin, MD;  [facility];  Service: Endoscopy;  Laterality: N/A;  10:00  . Esophagogastroduodenoscopy  06/17/2011    severe erosive/ulcerative reflux esophagitis, soft noncritical stricture dilatied, small hh, antral erosion   . Colonoscopy  07/15/2011    [id number] rectal and colon polyps  . Colonoscopy N/A 05/22/2015    Procedure: COLONOSCOPY;  Surgeon: Daneil Dolin, MD;  [facility];  Service: Endoscopy;  Laterality: N/A;  1300 - moved to 2:30 - office to notify  . Esophagogastroduodenoscopy N/A 05/22/2015    Procedure: ESOPHAGOGASTRODUODENOSCOPY (EGD);  Surgeon: Daneil Dolin, MD;  [facility];  Service: Endoscopy;  Laterality: N/A;  . Esophageal dilation N/A 05/22/2015    Procedure: ESOPHAGEAL DILATION;  Surgeon: Daneil Dolin, MD;  [facility];  Service: Endoscopy;  Laterality: N/A;  . Peripheral vascular catheterization N/A 05/28/2015    Procedure: Abdominal Aortogram;  Surgeon: Serafina Mitchell, MD;  Location: [place] CV LAB;  Service: Cardiovascular;  Laterality: N/A;  . Abdominal aortic aneurysm repair N/A 06/09/2015    Procedure: ANEURYSM ABDOMINAL AORTIC REPAIR;  Surgeon: Elam Dutch, MD;  Location: [place];  Service: Vascular;  Laterality: N/A;  . Aortic/renal bypass Left 06/09/2015    Procedure: LEFT RENAL Artery BYPASS;  Surgeon: Elam Dutch, MD;  Location: [place];  Service: Vascular;  Laterality: Left;  . Aortic  endarterecetomy N/A 06/09/2015    Procedure: AORTIC ENDARTERECETOMY;  Surgeon: Elam Dutch, MD;  Location: [place];  Service: Vascular;  Laterality: N/A;  . Insertion of dialysis catheter Left 06/26/2015    Procedure: INSERTION OF DIALYSIS CATHETER;  Surgeon: Angelia Mould, MD;  Location: [facility] OR;  Service: Vascular;  Laterality: Left;   Family History  Problem Relation Age of Onset  . Colon cancer Father 24    deceased  . Prostate cancer Father   . Liver disease Neg Hx   . Anesthesia  problems Neg Hx   . Hypotension Neg Hx   . Malignant hyperthermia Neg Hx   . Pseudochol deficiency Neg Hx    Social History  Substance Use Topics  . Smoking status: Former Smoker -- 0.10 packs/day for 40 years    Types: Cigarettes    Quit date: 05/24/2015  . Smokeless tobacco: Never Used     Comment: Patient smokes up to 5 cigarettes in a week  . Alcohol Use: No    Review of Systems  Constitutional: Negative for fever, chills, diaphoresis, appetite change and fatigue.  HENT: Negative for mouth sores, sore throat and trouble swallowing.   Eyes: Negative for visual disturbance.  Respiratory: Negative for cough, chest tightness, shortness of breath and wheezing.   Cardiovascular: Negative for chest pain.  Gastrointestinal: Positive for nausea and vomiting. Negative for abdominal pain, diarrhea and abdominal distention.  Endocrine: Negative for polydipsia, polyphagia and polyuria.  Genitourinary: Negative for dysuria, frequency and hematuria.  Musculoskeletal: Negative for gait problem.  Skin: Negative for color change, pallor and rash.  Neurological: Negative for dizziness, syncope, light-headedness and headaches.  Hematological: Does not bruise/bleed easily.  Psychiatric/Behavioral: Negative for behavioral problems and confusion.      Allergies  Review of patient's allergies indicates no known allergies.  Home Medications   Prior to Admission medications   Medication Sig Start Date End Date Taking? Authorizing Provider  albuterol (PROVENTIL HFA;VENTOLIN HFA) 108 (90 BASE) MCG/ACT inhaler Inhale 2-4 puffs into the lungs every 4 (four) hours as needed for wheezing or shortness of breath. 04/10/15   Kristen N Ward, DO  amLODipine (NORVASC) 10 MG tablet take 1 tablet by mouth once daily 07/16/14   Alycia Rossetti, MD  atorvastatin (LIPITOR) 20 MG tablet Take 1 tablet (20 mg total) by mouth at bedtime. 07/22/14   Alycia Rossetti, MD  diphenhydrAMINE (BENADRYL) 25 MG tablet Take 25  mg by mouth daily as needed for allergies.    Historical Provider, MD  fexofenadine (ALLEGRA) 180 MG tablet Take 1 tablet (180 mg total) by mouth daily. 07/01/11   Alycia Rossetti, MD  labetalol (NORMODYNE) 200 MG tablet Take 1 tablet (200 mg total) by mouth 2 (two) times daily. 07/07/15   Samantha J Rhyne, PA-C  Multiple Vitamin (MULITIVITAMIN WITH MINERALS) TABS Take 1 tablet by mouth daily.    Historical Provider, MD  Multiple Vitamin (ONE-A-DAY MENS PO) Take 1 tablet by mouth daily.    Historical Provider, MD  nitroGLYCERIN (NITROSTAT) 0.4 MG SL tablet place 1 tablet under the tongue if needed every 5 minutes for chest pain for 3 doses IF NO RELIEF AFTER 3RD DOSE CALL PRESCRIBER OR 911. 12/16/14   Lendon Colonel, NP  Nutritional Supplements (FEEDING SUPPLEMENT, NEPRO CARB STEADY,) LIQD Take 237 mLs by mouth 2 (two) times daily between meals. 07/07/15   Samantha J Rhyne, PA-C  Omega-3 Fatty Acids (FISH OIL) 1000 MG CAPS Take 1,000 mg by mouth daily.  11/12/11   Yehuda Savannah, MD  oxyCODONE-acetaminophen (PERCOCET/ROXICET) 5-325 MG tablet Take 1 tablet by mouth every 6 (six) hours as needed for moderate pain. 07/07/15   Samantha J Rhyne, PA-C  pantoprazole (PROTONIX) 40 MG tablet Take 1 tablet (40 mg total) by mouth daily. 07/08/15   Alycia Rossetti, MD  sodium bicarbonate 650 MG tablet Take 1 tablet (650 mg total) by mouth 3 (three) times daily. 07/07/15   Samantha J Rhyne, PA-C  tamsulosin (FLOMAX) 0.4 MG CAPS capsule Take 1 capsule (0.4 mg total) by mouth daily after supper. 07/07/15   Samantha J Rhyne, PA-C  traZODone (DESYREL) 50 MG tablet Take 0.5-1 tablets (25-50 mg total) by mouth at bedtime as needed for sleep. Patient taking differently: Take 50 mg by mouth at bedtime as needed for sleep.  07/16/14   Alycia Rossetti, MD  warfarin (COUMADIN) 5 MG tablet Take 1 1/2 tablets daily Patient taking differently: Take 5-7.5 mg by mouth daily. Take one tablet on all days except on Wednesdays and  Thursdays take one and one-half tablets 11/07/14   Lendon Colonel, NP   BP 161/87 mmHg  Pulse 96  Temp(Src) 98.1 F (36.7 C) (Oral)  Resp 20  Ht 6' (1.829 m)  Wt 176 lb 14.4 oz (80.241 kg)  BMI 23.99 kg/m2  SpO2 99% Physical Exam  Constitutional: He is oriented to person, place, and time. He appears well-developed and well-nourished. No distress.  HENT:  Head: Normocephalic.  Eyes: Conjunctivae are normal. Pupils are equal, round, and reactive to light. No scleral icterus.  Neck: Normal range of motion. Neck supple. No thyromegaly present.  Cardiovascular: Normal rate and regular rhythm.  Exam reveals no gallop and no friction rub.   No murmur heard. Pulmonary/Chest: Effort normal and breath sounds normal. No respiratory distress. He has no wheezes. He has no rales.  Abdominal: Soft. Bowel sounds are normal. He exhibits no distension. There is no tenderness. There is no rebound.    Musculoskeletal: Normal range of motion.  Neurological: He is alert and oriented to person, place, and time.  Skin: Skin is warm and dry. No rash noted.  Psychiatric: He has a normal mood and affect. His behavior is normal.    ED Course  Procedures (including critical care time) Labs Review Labs Reviewed  LIPASE, BLOOD - Abnormal; Notable for the following:    Lipase 61 (*)    All other components within normal limits  COMPREHENSIVE METABOLIC PANEL - Abnormal; Notable for the following:    CO2 19 (*)    Glucose, Bld 120 (*)    BUN 81 (*)    Creatinine, Ser 8.34 (*)    Albumin 2.3 (*)    AST 10 (*)    ALT 16 (*)    GFR calc non Af Amer 6 (*)    GFR calc Af Amer 7 (*)    Anion gap 16 (*)    All other components within normal limits  CBC - Abnormal; Notable for the following:    RBC 3.33 (*)    Hemoglobin 9.4 (*)    HCT 28.9 (*)    All other components within normal limits  COMPREHENSIVE METABOLIC PANEL - Abnormal; Notable for the following:    CO2 20 (*)    Glucose, Bld 118 (*)     BUN 81 (*)    Creatinine, Ser 8.31 (*)    Albumin 2.3 (*)    AST 11 (*)    ALT 16 (*)  GFR calc non Af Amer 6 (*)    GFR calc Af Amer 7 (*)    All other components within normal limits  PROTIME-INR - Abnormal; Notable for the following:    Prothrombin Time 32.8 (*)    INR 3.29 (*)    All other components within normal limits  LIPASE, BLOOD - Abnormal; Notable for the following:    Lipase 61 (*)    All other components within normal limits  AMYLASE  URINALYSIS, ROUTINE W REFLEX MICROSCOPIC (NOT AT [facility])  URINALYSIS, ROUTINE W REFLEX MICROSCOPIC (NOT AT [facility])  URINALYSIS COMPLETEWITH MICROSCOPIC [facility] ONLY)    Imaging Review Dg Abd Acute W/chest  07/09/2015  CLINICAL DATA:  Nausea, vomiting last night. EXAM: DG ABDOMEN ACUTE W/ 1V CHEST COMPARISON:  Chest x-ray 06/26/2015 FINDINGS: Left dialysis catheter remains in place, unchanged. Interval removal of right internal jugular central line. Heart is normal size. Bibasilar opacities, likely atelectasis with small bilateral effusions. Nonobstructive bowel gas pattern. No free air organomegaly. No suspicious calcification. No acute bony abnormality. IMPRESSION: No evidence of bowel obstruction or free air. Bibasilar atelectasis, small effusions. Electronically Signed   By: Rolm Baptise M.D.   On: 07/09/2015 10:56   I have personally reviewed and evaluated these images and lab results as part of my medical decision-making.   EKG Interpretation None      MDM   Final diagnoses:  Emesis   Admit orders written by Dr. Oneida Alar upon the patient's arrival to the ED. Labs noted. These do show improvement over most recent hospitalized numbers regarding his creatinine.    Tanna Furry, MD 07/09/15 1259

## 2015-07-09 NOTE — Consult Note (Signed)
Pt known to me.  Recent AAA repair with left renal bypass.  Had renal failure during admission.  Will admit for IV hydration and further evaluation.  Full H and P to follow.  Ruta Hinds, MD [facility] [place] Office: [phone] Pager: [phone]

## 2015-07-09 NOTE — ED Notes (Signed)
Pt states that he has had N/V since tuesday. Pt denies any abdominal pain. Pt reports only being able to tolerate water. Pt was recently discharged for a aneurysm repair. Pt states that he was having nausea while in the hospital as well.

## 2015-07-09 NOTE — H&P (Signed)
[facility] [place] HISTORY AND PHYSICAL   History of Present Illness:  Patient is a 65 y.o. male who presents for evaluation of nausea and vomiting.  Pt recently underwent repair of AAA and left renal bypass complicated by post op renal failure.  He was dc'd yesterday but at home was having nausea and vomiting mainly with solid foods.  Was able to keep liquids down.  He denies fever or chills.  He continues to pass flatus.  Past Medical History  Diagnosis Date  . Arteriosclerotic cardiovascular disease (ASCVD)     AMI in 2000 treated at [facility]; cath in 12/2006->  Chronic total obstruction of the RCA;  drug-eluting stent placed in M1; inferior hypokinesis with an EF of 45%  . Tobacco abuse, in remission     20 pack years; quit in 2009  . Hypertension   . Chronic anticoagulation   . Permanent atrial fibrillation (HCC)     on coumadin for couple of years, stopped plavix at that time  . PVD (peripheral vascular disease) ([place])     Ct angiogram in 2009 revealed stable disease with 80% celiac stenosis,50% right renal artery ,ASCVD with ulceration in the abdominal aortashe  . Nephrolithiasis   . Cerebrovascular disease 2002    carotid stent  . Hyperlipidemia   . Myocardial infarction ([place]) 10 yrs ago  . Testicular carcinoma ([place]) 1990    right orchiectomy  . AAA (abdominal aortic aneurysm) ([place]) 2017    3.3cm  . GERD (gastroesophageal reflux disease)   . Chronic combined systolic and diastolic CHF (congestive heart failure) [facility])     Past Surgical History  Procedure Laterality Date  . Appendectomy  2004  . Testicular cancer  1990    right orchiectomy  . Colonoscopy  06/17/2011    INCOMPLETE, PREP POOR. Procedure: COLONOSCOPY;  Surgeon: Daneil Dolin, MD;  [facility];  Service: Endoscopy;  Laterality: N/A;  10:00  . Esophagogastroduodenoscopy  06/17/2011    severe erosive/ulcerative reflux esophagitis, soft noncritical stricture dilatied, small hh,  antral erosion   . Colonoscopy  07/15/2011    [id number] rectal and colon polyps  . Colonoscopy N/A 05/22/2015    Procedure: COLONOSCOPY;  Surgeon: Daneil Dolin, MD;  [facility];  Service: Endoscopy;  Laterality: N/A;  1300 - moved to 2:30 - office to notify  . Esophagogastroduodenoscopy N/A 05/22/2015    Procedure: ESOPHAGOGASTRODUODENOSCOPY (EGD);  Surgeon: Daneil Dolin, MD;  [facility];  Service: Endoscopy;  Laterality: N/A;  . Esophageal dilation N/A 05/22/2015    Procedure: ESOPHAGEAL DILATION;  Surgeon: Daneil Dolin, MD;  [facility];  Service: Endoscopy;  Laterality: N/A;  . Peripheral vascular catheterization N/A 05/28/2015    Procedure: Abdominal Aortogram;  Surgeon: Serafina Mitchell, MD;  Location: [place] CV LAB;  Service: Cardiovascular;  Laterality: N/A;  . Abdominal aortic aneurysm repair N/A 06/09/2015    Procedure: ANEURYSM ABDOMINAL AORTIC REPAIR;  Surgeon: Elam Dutch, MD;  Location: [place];  Service: Vascular;  Laterality: N/A;  . Aortic/renal bypass Left 06/09/2015    Procedure: LEFT RENAL Artery BYPASS;  Surgeon: Elam Dutch, MD;  Location: [place];  Service: Vascular;  Laterality: Left;  . Aortic endarterecetomy N/A 06/09/2015    Procedure: AORTIC ENDARTERECETOMY;  Surgeon: Elam Dutch, MD;  Location: [place];  Service: Vascular;  Laterality: N/A;  . Insertion of dialysis catheter Left 06/26/2015    Procedure: INSERTION OF DIALYSIS CATHETER;  Surgeon: Angelia Mould, MD;  Location: [facility] OR;  Service: Vascular;  Laterality: Left;    Social History Social History  Substance Use Topics  . Smoking status: Former Smoker -- 0.10 packs/day for 40 years    Types: Cigarettes    Quit date: 05/24/2015  . Smokeless tobacco: Never Used     Comment: Patient smokes up to 5 cigarettes in a week  . Alcohol Use: No    Family History Family History  Problem Relation Age of Onset  . Colon cancer Father 70    deceased  .  Prostate cancer Father   . Liver disease Neg Hx   . Anesthesia problems Neg Hx   . Hypotension Neg Hx   . Malignant hyperthermia Neg Hx   . Pseudochol deficiency Neg Hx     Allergies  No Known Allergies   Current Facility-Administered Medications  Medication Dose Route Frequency Provider Last Rate Last Dose  . acetaminophen (TYLENOL) tablet 325-650 mg  325-650 mg Oral Q4H PRN Elam Dutch, MD       Or  . acetaminophen (TYLENOL) suppository 325-650 mg  325-650 mg Rectal Q4H PRN Elam Dutch, MD      . bisacodyl (DULCOLAX) suppository 10 mg  10 mg Rectal Daily PRN Elam Dutch, MD      . dextrose 5 %-0.45 % sodium chloride infusion   Intravenous Continuous Elam Dutch, MD 125 mL/hr at 07/09/15 1334    . docusate sodium (COLACE) capsule 100 mg  100 mg Oral BID Elam Dutch, MD   100 mg at 07/09/15 1338  . guaiFENesin-dextromethorphan (ROBITUSSIN DM) 100-10 MG/5ML syrup 15 mL  15 mL Oral Q4H PRN Elam Dutch, MD      . hydrALAZINE (APRESOLINE) injection 5 mg  5 mg Intravenous Q20 Min PRN Elam Dutch, MD      . labetalol (NORMODYNE,TRANDATE) injection 10 mg  10 mg Intravenous Q10 min PRN Elam Dutch, MD      . metoprolol (LOPRESSOR) injection 2-5 mg  2-5 mg Intravenous Q2H PRN Elam Dutch, MD      . morphine 2 MG/ML injection 2-5 mg  2-5 mg Intravenous Q1H PRN Elam Dutch, MD      . ondansetron [facility]) injection 4 mg  4 mg Intravenous Q6H PRN Elam Dutch, MD      . ondansetron (ZOFRAN-ODT) 4 MG disintegrating tablet           . pantoprazole (PROTONIX) EC tablet 40 mg  40 mg Oral Daily Elam Dutch, MD   40 mg at 07/09/15 1335  . phenol (CHLORASEPTIC) mouth spray 1 spray  1 spray Mouth/Throat PRN Elam Dutch, MD       Current Outpatient Prescriptions  Medication Sig Dispense Refill  . albuterol (PROVENTIL HFA;VENTOLIN HFA) 108 (90 BASE) MCG/ACT inhaler Inhale 2-4 puffs into the lungs every 4 (four) hours as needed for wheezing or  shortness of breath. 1 Inhaler 0  . amLODipine (NORVASC) 10 MG tablet take 1 tablet by mouth once daily 90 tablet 3  . atorvastatin (LIPITOR) 20 MG tablet Take 1 tablet (20 mg total) by mouth at bedtime. 30 tablet 3  . diphenhydrAMINE (BENADRYL) 25 MG tablet Take 25 mg by mouth daily as needed for allergies.    . fexofenadine (ALLEGRA) 180 MG tablet Take 1 tablet (180 mg total) by mouth daily. 30 tablet 2  . labetalol (NORMODYNE) 200 MG tablet Take 1 tablet (200 mg total)  by mouth 2 (two) times daily. 60 tablet 2  . Multiple Vitamin (MULITIVITAMIN WITH MINERALS) TABS Take 1 tablet by mouth daily.    . Multiple Vitamin (ONE-A-DAY MENS PO) Take 1 tablet by mouth daily.    . nitroGLYCERIN (NITROSTAT) 0.4 MG SL tablet place 1 tablet under the tongue if needed every 5 minutes for chest pain for 3 doses IF NO RELIEF AFTER 3RD DOSE CALL PRESCRIBER OR 911. 25 tablet 2  . Nutritional Supplements (FEEDING SUPPLEMENT, NEPRO CARB STEADY,) LIQD Take 237 mLs by mouth 2 (two) times daily between meals.  0  . Omega-3 Fatty Acids (FISH OIL) 1000 MG CAPS Take 1,000 mg by mouth daily.   0  . oxyCODONE-acetaminophen (PERCOCET/ROXICET) 5-325 MG tablet Take 1 tablet by mouth every 6 (six) hours as needed for moderate pain. 30 tablet 0  . pantoprazole (PROTONIX) 40 MG tablet Take 1 tablet (40 mg total) by mouth daily. 90 tablet 1  . sodium bicarbonate 650 MG tablet Take 1 tablet (650 mg total) by mouth 3 (three) times daily. 90 tablet 1  . tamsulosin (FLOMAX) 0.4 MG CAPS capsule Take 1 capsule (0.4 mg total) by mouth daily after supper. 30 capsule 2  . traZODone (DESYREL) 50 MG tablet Take 0.5-1 tablets (25-50 mg total) by mouth at bedtime as needed for sleep. (Patient taking differently: Take 50 mg by mouth at bedtime as needed for sleep. ) 30 tablet 3  . warfarin (COUMADIN) 5 MG tablet Take 1 1/2 tablets daily (Patient taking differently: Take 5-7.5 mg by mouth daily. Take one tablet on all days except on Wednesdays and  Thursdays take one and one-half tablets) 45 tablet 6    ROS:   General:  No weight loss, Fever, chills  HEENT: No recent headaches, no nasal bleeding, no visual changes, no sore throat  Neurologic: No dizziness, blackouts, seizures. No recent symptoms of stroke or mini- stroke. No recent episodes of slurred speech, or temporary blindness.  Cardiac: No recent episodes of chest pain/pressure, no shortness of breath at rest.  No shortness of breath with exertion.  Denies history of atrial fibrillation or irregular heartbeat  Vascular: No history of rest pain in feet.  No history of claudication.  No history of non-healing ulcer, No history of DVT   Pulmonary: No home oxygen, no productive cough, no hemoptysis,  No asthma or wheezing  Musculoskeletal:  [ ]  Arthritis, [ ]  Low back pain,  [ ]  Joint pain  Hematologic:No history of hypercoagulable state.  No history of easy bleeding.  No history of anemia  Gastrointestinal: No hematochezia or melena,  No gastroesophageal reflux, no trouble swallowing  Urinary: [ ]  chronic Kidney disease, [ ]  on HD - [ ]  MWF or [ ]  TTHS, [ ]  Burning with urination, [ ]  Frequent urination, [ ]  Difficulty urinating;   Skin: No rashes  Psychological: No history of anxiety,  No history of depression   Physical Examination  Filed Vitals:   07/09/15 1200 07/09/15 1230 07/09/15 1300 07/09/15 1330  BP: 154/94 150/93 152/97 162/90  Pulse: 99 86 98 103  Temp:      TempSrc:      Resp: 15 12 10 16   Height:      Weight:      SpO2: 98% 98% 98% 98%    Body mass index is 23.99 kg/(m^2).  General:  Alert and oriented, no acute distress HEENT: Normal Neck: No bruit or JVD Pulmonary: Clear to auscultation bilaterally Cardiac: Regular Rate and  Rhythm without murmur Abdomen: Soft, minmal right lower quadrant tenderness over recent lovenox injection site, non-distended, no mass, healed midline incision Skin: No rash Extremity Pulses:  2+ radial, brachial,  femoral, dorsalis pedis, posterior tibial pulses bilaterally Musculoskeletal: No deformity or edema  Neurologic: Upper and lower extremity motor 5/5 and symmetric  DATA:   CBC    Component Value Date/Time   WBC 7.6 07/09/2015 1007   RBC 3.33* 07/09/2015 1007   HGB 9.4* 07/09/2015 1007   HCT 28.9* 07/09/2015 1007   PLT 303 07/09/2015 1007   MCV 86.8 07/09/2015 1007   MCH 28.2 07/09/2015 1007   MCHC 32.5 07/09/2015 1007   RDW 15.2 07/09/2015 1007   LYMPHSABS 0.9 06/23/2015 0250   MONOABS 1.2* 06/23/2015 0250   EOSABS 0.1 06/23/2015 0250   BASOSABS 0.0 06/23/2015 0250     BMET    Component Value Date/Time   NA 138 07/09/2015 1007   NA 138 07/09/2015 1007   K 3.5 07/09/2015 1007   K 3.5 07/09/2015 1007   CL 103 07/09/2015 1007   CL 104 07/09/2015 1007   CO2 19* 07/09/2015 1007   CO2 20* 07/09/2015 1007   GLUCOSE 120* 07/09/2015 1007   GLUCOSE 118* 07/09/2015 1007   BUN 81* 07/09/2015 1007   BUN 81* 07/09/2015 1007   CREATININE 8.34* 07/09/2015 1007   CREATININE 8.31* 07/09/2015 1007   CREATININE 1.10 11/07/2014 0804   CALCIUM 9.2 07/09/2015 1007   CALCIUM 9.2 07/09/2015 1007   GFRNONAA 6* 07/09/2015 1007   GFRNONAA 6* 07/09/2015 1007   GFRNONAA 53* 05/14/2013 0905   GFRAA 7* 07/09/2015 1007   GFRAA 7* 07/09/2015 1007   GFRAA 62 05/14/2013 0905   Abdomen xray no air fluid levels Mildly elevated lipase   ASSESSMENT: Nausea and vomiting unknown etiology.  Creatinine improved from discharge but need to continue to monitor tenuous renal function.   PLAN:  Admit for IV hydration.  If unable to tolerate diet tomorrow may consider further work up of nausea.  Will hold coumadin for now so that maybe we can get his dialysis catheter out while here.  Ruta Hinds, MD [facility] [place] Office: [phone] Pager: [phone]

## 2015-07-09 NOTE — ED Notes (Signed)
Attempted report 

## 2015-07-10 LAB — CBC
HCT: 26.6 % — ABNORMAL LOW (ref 39.0–52.0)
Hemoglobin: 8.8 g/dL — ABNORMAL LOW (ref 13.0–17.0)
MCH: 28.5 pg (ref 26.0–34.0)
MCHC: 33.1 g/dL (ref 30.0–36.0)
MCV: 86.1 fL (ref 78.0–100.0)
Platelets: 251 10*3/uL (ref 150–400)
RBC: 3.09 MIL/uL — ABNORMAL LOW (ref 4.22–5.81)
RDW: 15 % (ref 11.5–15.5)
WBC: 8.2 10*3/uL (ref 4.0–10.5)

## 2015-07-10 LAB — BASIC METABOLIC PANEL
Anion gap: 14 (ref 5–15)
BUN: 67 mg/dL — ABNORMAL HIGH (ref 6–20)
CO2: 18 mmol/L — ABNORMAL LOW (ref 22–32)
Calcium: 8.7 mg/dL — ABNORMAL LOW (ref 8.9–10.3)
Chloride: 107 mmol/L (ref 101–111)
Creatinine, Ser: 7.21 mg/dL — ABNORMAL HIGH (ref 0.61–1.24)
GFR calc Af Amer: 8 mL/min — ABNORMAL LOW (ref >60)
GFR calc non Af Amer: 7 mL/min — ABNORMAL LOW (ref >60)
Glucose, Bld: 121 mg/dL — ABNORMAL HIGH (ref 65–99)
Potassium: 3.3 mmol/L — ABNORMAL LOW (ref 3.5–5.1)
Sodium: 139 mmol/L (ref 135–145)

## 2015-07-10 LAB — GLUCOSE, CAPILLARY: Glucose-Capillary: 96 mg/dL (ref 65–99)

## 2015-07-10 LAB — OCCULT BLOOD X 1 CARD TO LAB, STOOL: Fecal Occult Bld: POSITIVE — AB

## 2015-07-10 MED ORDER — PNEUMOCOCCAL VAC POLYVALENT 25 MCG/0.5ML IJ INJ: 0.5000 mL | INJECTION | INTRAMUSCULAR | Status: DC

## 2015-07-10 MED ORDER — OXYCODONE-ACETAMINOPHEN 5-325 MG PO TABS
1.0000 | ORAL_TABLET | Freq: Four times a day (QID) | ORAL | Status: DC | PRN
  Administered 2015-07-10 – 2015-07-21 (×31): 1 via ORAL
  Filled 2015-07-10 (×31): qty 1

## 2015-07-10 MED ORDER — BISACODYL 10 MG RE SUPP
10.0000 mg | Freq: Once | RECTAL | Status: AC
  Administered 2015-07-10: 10 mg via RECTAL
  Filled 2015-07-10: qty 1

## 2015-07-10 NOTE — Progress Notes (Signed)
Pt vomited 1x shortly after finishing clear liquid breakfast tray. Gave zofran. Will continue to monitor.

## 2015-07-10 NOTE — Progress Notes (Addendum)
[facility] [place]  Subjective  - still some nausea maybe slightly improve, passing flatus bowel movement 2 d ago   Objective 156/91 54 97.7 F (36.5 C) (Oral) 18 98%  Intake/Output Summary (Last 24 hours) at 07/10/15 0830 Last data filed at 07/10/15 0603  Gross per 24 hour  Intake      0 ml  Output   1175 ml  Net  -1175 ml   Abdomen soft non tender  Assessment/Planning: Start clears today.  If has further vomiting or persistent nausea will consider CT with oral contrast tomorrow. Will give dulcolax today to see if bowel function playing a role. Keep IV fluids going for now.  Trend renal function. Will plan to d/c dialysis catheter when INR < 1.7.  Hold coumadin, follow INR  Ruta Hinds 07/10/2015 8:30 AM --  Laboratory Lab Results:  Recent Labs  07/09/15 1007  WBC 7.6  HGB 9.4*  HCT 28.9*  PLT 303   BMET  Recent Labs  07/09/15 1007  NA 138  138  K 3.5  3.5  CL 104  103  CO2 20*  19*  GLUCOSE 118*  120*  BUN 81*  81*  CREATININE 8.31*  8.34*  CALCIUM 9.2  9.2    COAG Lab Results  Component Value Date   INR 3.29* 07/09/2015   INR 3.07* 07/07/2015   INR 2.83* 07/06/2015   No results found for: PTT

## 2015-07-10 NOTE — Progress Notes (Signed)
Pt arrived to unit.  Telemetry applied and CCMD notified.  VS taken and systolic BP elevated.  Will treat per PRN instruction.  Pt denies nausea at this time.   Will cont to monitor.

## 2015-07-11 ENCOUNTER — Other Ambulatory Visit (HOSPITAL_COMMUNITY): Payer: Self-pay | Admitting: *Deleted

## 2015-07-11 ENCOUNTER — Inpatient Hospital Stay (HOSPITAL_COMMUNITY): Payer: Medicare Other

## 2015-07-11 LAB — BASIC METABOLIC PANEL
Anion gap: 12 (ref 5–15)
BUN: 56 mg/dL — ABNORMAL HIGH (ref 6–20)
CO2: 19 mmol/L — ABNORMAL LOW (ref 22–32)
Calcium: 8.7 mg/dL — ABNORMAL LOW (ref 8.9–10.3)
Chloride: 106 mmol/L (ref 101–111)
Creatinine, Ser: 6.53 mg/dL — ABNORMAL HIGH (ref 0.61–1.24)
GFR calc Af Amer: 9 mL/min — ABNORMAL LOW (ref >60)
GFR calc non Af Amer: 8 mL/min — ABNORMAL LOW (ref >60)
Glucose, Bld: 111 mg/dL — ABNORMAL HIGH (ref 65–99)
Potassium: 3.1 mmol/L — ABNORMAL LOW (ref 3.5–5.1)
Sodium: 137 mmol/L (ref 135–145)

## 2015-07-11 LAB — GLUCOSE, CAPILLARY
Glucose-Capillary: 105 mg/dL — ABNORMAL HIGH (ref 65–99)
Glucose-Capillary: 110 mg/dL — ABNORMAL HIGH (ref 65–99)
Glucose-Capillary: 80 mg/dL (ref 65–99)

## 2015-07-11 LAB — PROTIME-INR
INR: 5.41 (ref 0.00–1.49)
Prothrombin Time: 47.1 seconds — ABNORMAL HIGH (ref 11.6–15.2)

## 2015-07-11 MED ORDER — PHYTONADIONE 5 MG PO TABS
5.0000 mg | ORAL_TABLET | Freq: Once | ORAL | Status: AC
  Administered 2015-07-11: 5 mg via ORAL
  Filled 2015-07-11 (×2): qty 1

## 2015-07-11 MED ORDER — IOHEXOL 300 MG/ML  SOLN
25.0000 mL | Freq: Once | INTRAMUSCULAR | Status: AC | PRN
  Administered 2015-07-11: 25 mL via ORAL

## 2015-07-11 MED ORDER — FERROUS SULFATE 325 (65 FE) MG PO TABS
325.0000 mg | ORAL_TABLET | Freq: Three times a day (TID) | ORAL | Status: DC
  Administered 2015-07-11 – 2015-07-21 (×29): 325 mg via ORAL
  Filled 2015-07-11 (×32): qty 1

## 2015-07-11 MED ORDER — POTASSIUM CHLORIDE ER 10 MEQ PO TBCR
40.0000 meq | EXTENDED_RELEASE_TABLET | Freq: Once | ORAL | Status: AC
  Administered 2015-07-11: 40 meq via ORAL
  Filled 2015-07-11 (×2): qty 4

## 2015-07-11 MED ORDER — SODIUM BICARBONATE 650 MG PO TABS
650.0000 mg | ORAL_TABLET | Freq: Three times a day (TID) | ORAL | Status: DC
  Administered 2015-07-11 – 2015-07-21 (×30): 650 mg via ORAL
  Filled 2015-07-11 (×31): qty 1

## 2015-07-11 MED ORDER — IOHEXOL 300 MG/ML  SOLN: 25.0000 mL | Freq: Once | INTRAMUSCULAR | Status: DC | PRN

## 2015-07-11 NOTE — Progress Notes (Signed)
CRITICAL VALUE ALERT  Critical value received:  INR 5.41 Date of notification:  07/11/2015  Time of notification:  [id number]  Critical value read back:Yes.    Nurse who received alert:  Alexia Freestone   MD notified (1st page):  MD Scot Dock  Time of first page:  [phone]  MD notified (2nd page):  Time of second page:  Responding MD:  MD Scot Dock  Time MD responded:  [phone]

## 2015-07-11 NOTE — Care Management Important Message (Signed)
Important Message  Patient Details  Name: [name] [name] MRN: [id number] Date of Birth: 04/26/1951   Medicare Important Message Given:  Yes    Nathen May 07/11/2015, 11:50 AM

## 2015-07-11 NOTE — Care Management Note (Signed)
Case Management Note  Patient Details  Name: [name] [name] MRN: [id number] Date of Birth: 1950-10-03  Subjective/Objective:     Readmission from home, 06/09/2015 Repair of juxtarenal abdominal aortic aneurysm, left renal artery bypass              Action/Plan:  NCM spoke to pt and wife at bedside. Wife states they are agreeable to [facility] with [facility]. Pt declined last admission. Pt states she picked up RW with seat from [employer]. Requesting 3n1 for home. Contacted [facility] for 3n1 for home. Contacted [facility] rep with [place] referral.  Will HH PT/RN orders with F2F at dc.    Expected Discharge Date:                  Expected Discharge Plan:  [place]  In-House Referral:  NA  Discharge planning Services  CM Consult  Post Acute Care Choice:  Home Health Choice offered to:  Spouse  DME Arranged:  3-N-1 DME Agency:  [place] Arranged:  RN, PT [facility] Agency:  [place]  Status of Service:  Completed, signed off  Medicare Important Message Given:  Yes Date Medicare IM Given:    Medicare IM give by:    Date Additional Medicare IM Given:    Additional Medicare Important Message give by:     If discussed at [place] of Stay Meetings, dates discussed:    Additional Comments:  Erenest Rasher, RN 07/11/2015, 3:58 PM

## 2015-07-11 NOTE — Progress Notes (Signed)
Utilization review completed. Avishai Reihl, RN, BSN. 

## 2015-07-11 NOTE — Progress Notes (Addendum)
[facility] [place]  Subjective  - Still having nausea and vomiting.  He did have a BM with hemoccult positive study.     Objective 155/84 77 97.7 F (36.5 C) (Oral) 18 98%  Intake/Output Summary (Last 24 hours) at 07/11/15 0747 Last data filed at 07/10/15 1629  Gross per 24 hour  Intake    600 ml  Output      0 ml  Net    600 ml    Abdomin soft, slight tenderness right LQ Feet warm well perfused  Assessment/Planning: 06/09/2015 Repair of juxtarenal abdominal aortic aneurysm, left renal artery bypass  He still has nausea, vomiting small amounts of clears, BM hemoccult positive HGB  9.4 down to 8.8 this am Cr 6.53 trending down, urinating 6 times yesterday with UO  Possible CTA abdomin and pelvis today  COLLINS, EMMA [facility] 07/11/2015 7:47 AM --  History and exam findings as above.  Still with nausea.  Mild abdominal pain. Will get CT abdomen/pelvis with oral contrast only today to rule out obstruction or ischemic colitis  Coagulapathy: INR trending up despite no coumadin for almost 72 hours.  Trend for now.  Possibly cause of hemoccult.  Doubt he has ongoing bleeding as hemoglobin has been fairly stable over last few weeks.  Anemia most likely from prior operation with renal dysfunction. Treat anemia with iron supplement.  Protein calorie malnutrition: continue IV fluid for now.  Hopefully advance diet if CT without findings.  Will need some type of nutritional supplement either IV or enteral soon.  Renal dysfunction.  Good urine output with creatinine continuing to trend down. Continue to follow.  Avoid any nephrotoxic drugs or contrast.  Hypokalemia: replete gently  Mild metabolic acidosis most likely renal related. Place on sodium bicarbonate until CO2 greater than 22.  Ruta Hinds, MD [facility] [place] Office: [phone] Pager: [phone]  Laboratory Lab Results:  Recent Labs  07/09/15 1007  07/10/15 0845  WBC 7.6 8.2  HGB 9.4* 8.8*  HCT 28.9* 26.6*  PLT 303 251   BMET  Recent Labs  07/10/15 0845 07/11/15 0240  NA 139 137  K 3.3* 3.1*  CL 107 106  CO2 18* 19*  GLUCOSE 121* 111*  BUN 67* 56*  CREATININE 7.21* 6.53*  CALCIUM 8.7* 8.7*    COAG Lab Results  Component Value Date   INR 5.41* 07/11/2015   INR 3.29* 07/09/2015   INR 3.07* 07/07/2015   No results found for: PTT

## 2015-07-12 DIAGNOSIS — R072 Precordial pain: Secondary | ICD-10-CM

## 2015-07-12 DIAGNOSIS — I10 Essential (primary) hypertension: Secondary | ICD-10-CM

## 2015-07-12 DIAGNOSIS — I4891 Unspecified atrial fibrillation: Secondary | ICD-10-CM

## 2015-07-12 LAB — BASIC METABOLIC PANEL
Anion gap: 10 (ref 5–15)
BUN: 47 mg/dL — ABNORMAL HIGH (ref 6–20)
CO2: 19 mmol/L — ABNORMAL LOW (ref 22–32)
Calcium: 8.4 mg/dL — ABNORMAL LOW (ref 8.9–10.3)
Chloride: 107 mmol/L (ref 101–111)
Creatinine, Ser: 5.57 mg/dL — ABNORMAL HIGH (ref 0.61–1.24)
GFR calc Af Amer: 11 mL/min — ABNORMAL LOW (ref >60)
GFR calc non Af Amer: 10 mL/min — ABNORMAL LOW (ref >60)
Glucose, Bld: 107 mg/dL — ABNORMAL HIGH (ref 65–99)
Potassium: 3 mmol/L — ABNORMAL LOW (ref 3.5–5.1)
Sodium: 136 mmol/L (ref 135–145)

## 2015-07-12 LAB — CBC
HCT: 27.3 % — ABNORMAL LOW (ref 39.0–52.0)
Hemoglobin: 8.7 g/dL — ABNORMAL LOW (ref 13.0–17.0)
MCH: 27.4 pg (ref 26.0–34.0)
MCHC: 31.9 g/dL (ref 30.0–36.0)
MCV: 86.1 fL (ref 78.0–100.0)
Platelets: 272 10*3/uL (ref 150–400)
RBC: 3.17 MIL/uL — ABNORMAL LOW (ref 4.22–5.81)
RDW: 14.9 % (ref 11.5–15.5)
WBC: 12.8 10*3/uL — ABNORMAL HIGH (ref 4.0–10.5)

## 2015-07-12 LAB — PROTIME-INR
INR: 2.67 — ABNORMAL HIGH (ref 0.00–1.49)
Prothrombin Time: 28 seconds — ABNORMAL HIGH (ref 11.6–15.2)

## 2015-07-12 LAB — GLUCOSE, CAPILLARY: Glucose-Capillary: 95 mg/dL (ref 65–99)

## 2015-07-12 LAB — TROPONIN I: Troponin I: 0.04 ng/mL — ABNORMAL HIGH (ref <0.031)

## 2015-07-12 MED ORDER — HYDRALAZINE HCL 20 MG/ML IJ SOLN
20.0000 mg | Freq: Once | INTRAMUSCULAR | Status: AC
  Administered 2015-07-12: 20 mg via INTRAVENOUS
  Filled 2015-07-12: qty 1

## 2015-07-12 MED ORDER — POTASSIUM CHLORIDE 10 MEQ/100ML IV SOLN
10.0000 meq | INTRAVENOUS | Status: AC
  Administered 2015-07-12 – 2015-07-13 (×3): 10 meq via INTRAVENOUS
  Filled 2015-07-12 (×3): qty 100

## 2015-07-12 MED ORDER — PROMETHAZINE HCL 25 MG/ML IJ SOLN
12.5000 mg | INTRAMUSCULAR | Status: DC | PRN
  Administered 2015-07-12 (×2): 25 mg via INTRAVENOUS
  Filled 2015-07-12 (×2): qty 1

## 2015-07-12 MED ORDER — NITROGLYCERIN 0.4 MG SL SUBL
SUBLINGUAL_TABLET | SUBLINGUAL | Status: AC
  Administered 2015-07-12: 21:00:00
  Filled 2015-07-12: qty 1

## 2015-07-12 MED ORDER — PROMETHAZINE HCL 25 MG/ML IJ SOLN
25.0000 mg | Freq: Four times a day (QID) | INTRAMUSCULAR | Status: DC | PRN
  Administered 2015-07-13 – 2015-07-18 (×10): 25 mg via INTRAVENOUS
  Filled 2015-07-12 (×14): qty 1

## 2015-07-12 MED ORDER — METOPROLOL TARTRATE 1 MG/ML IV SOLN
5.0000 mg | Freq: Four times a day (QID) | INTRAVENOUS | Status: DC | PRN
  Administered 2015-07-12 – 2015-07-13 (×2): 5 mg via INTRAVENOUS
  Filled 2015-07-12 (×3): qty 5

## 2015-07-12 NOTE — Progress Notes (Addendum)
pts bp 186/97. Paged MD, Adele Barthel, New order received for one dose of Hydralazine 20mg  IV. Will continue to monitor.

## 2015-07-12 NOTE — Progress Notes (Addendum)
[facility] [place]  Subjective  - N/V/D He states he is unable to tolerate PO even water makes him nauseous.     Objective 161/83 99 97.8 F (36.6 C) (Oral) 18 100%  Intake/Output Summary (Last 24 hours) at 07/12/15 0739 Last data filed at 07/11/15 1700  Gross per 24 hour  Intake      0 ml  Output      0 ml  Net      0 ml   CT with oral contrast: N/V IMPRESSION: 1. Interval development of a 3.9 x 5.2 x 3.2 cm circumscribed ovoid fluid collection at the junction of the small bowel wall and small bowel mesentery in the mid abdomen just left of midline in the region of the retroperitoneal operative field. This fluid collection exerts local mass effect on the small bowel but is not obstructing. There is no dilation of small bowel proximally and the ingested oral contrast material has passed distal to this region. This is likely an incidental finding and may not be related to the patient's clinical symptoms. Statistically, this most likely represents a postoperative seroma or lymphocele. Infection is difficult to exclude entirely but there are no suspicious imaging features. 2. Expected postoperative changes of Dacron graft aortic repair and left renal arterial bypass with expected retroperitoneal and perinephric stranding. Patency of the left renal graft cannot be assessed in the absence of intravenous contrast. 3. Mild left renal edema and perinephric stranding. 4. Interval development of numerous nodular opacities in the visualized lung bases in a peribronchovascular distribution. This is most consistent with an active infectious or inflammatory process including multifocal pneumonia, or aspiration given the clinical history of frequent vomiting. 5. Small bilateral layering pleural effusions. 6. Stable right renal atrophy.  Abdomin soft Feet well perfused and warm   Assessment/Planning: 06/09/2015 Repair of juxtarenal abdominal aortic aneurysm,  left renal artery bypass  I have added phenergan as an anti-nausea medication, he states the Zofran has made him feel sick.   I D/C'd the Zofran for now.   Will discuss CT finding with Dr. Bridgett Larsson.  He has IV fluids at 125 cc/hr.   Will maintain NPO for now.  Laurence Slate [facility] 07/12/2015 7:39 AM --  Laboratory Lab Results:  Recent Labs  07/10/15 0845 07/12/15 0336  WBC 8.2 12.8*  HGB 8.8* 8.7*  HCT 26.6* 27.3*  PLT 251 272   BMET  Recent Labs  07/11/15 0240 07/12/15 0336  NA 137 136  K 3.1* 3.0*  CL 106 107  CO2 19* 19*  GLUCOSE 111* 107*  BUN 56* 47*  CREATININE 6.53* 5.57*  CALCIUM 8.7* 8.4*    COAG Lab Results  Component Value Date   INR 2.67* 07/12/2015   INR 5.41* 07/11/2015   INR 3.29* 07/09/2015   No results found for: PTT  Addendum  I have independently interviewed and examined the patient, and I agree with the physician assistant's findings.  Incision ok.  Nausea better.  Abd CT not impressive: suspect lymphocele related to surgery.  No intervention needed.  Will trial clears.  Adele Barthel, MD [facility] [place] Office: [phone] Pager: [phone]  07/12/2015, 2:06 PM

## 2015-07-12 NOTE — Progress Notes (Signed)
Bp 184/91and Vtachs with SOB . Dr. Bridgett Larsson on call paged. Waiting for response.

## 2015-07-12 NOTE — Consult Note (Addendum)
CARDIOLOGY INPATIENT CONSULTATION NOTE  Patient ID: [name] [name] MRN: [id number], DOB/AGE: 1950/07/21   Admit date: 07/09/2015   Primary Physician: Vic Blackbird, MD Primary Cardiologist: none, prior cardiologist retired  Reason for Consult:   Chest pai  Requesting Physician: Dr. Bridgett Larsson   HPI: This is a 65 y.o. male with prior known history of MI in 2000 s/p PCI, CTO of RCA DES placed in OM1, ischemic cardiomyopathy LVEF 45%, permanent atrial fibrillation, peripheral vascular disease,  Patient recently underwent AAA repair and left renal bypass which was complicated by post op renal failure requiring hemodialysis and nausea/vomiting. Patient was readmitted to the hospital again on 3/8 with nausea/vomiting. Today he developed severe nausea and started having chest pain around 8 pm.  The chest pain was tight and dull in character across the whole chest, did not radiate, started before vomiting, stayed for 3-4 minutes and was relieved with NTG. Patient has not been having any chest pain for the last 5 months. He has taken only 1 time NTG in the last one year. This pain was different from his prior MI chest pain.    Problem List: Past Medical History  Diagnosis Date  . Arteriosclerotic cardiovascular disease (ASCVD)     AMI in 2000 treated at [facility]; cath in 12/2006->  Chronic total obstruction of the RCA;  drug-eluting stent placed in M1; inferior hypokinesis with an EF of 45%  . Tobacco abuse, in remission     20 pack years; quit in 2009  . Hypertension   . Chronic anticoagulation   . Permanent atrial fibrillation (HCC)     on coumadin for couple of years, stopped plavix at that time  . PVD (peripheral vascular disease) ([place])     Ct angiogram in 2009 revealed stable disease with 80% celiac stenosis,50% right renal artery ,ASCVD with ulceration in the abdominal aortashe  . Nephrolithiasis   . Cerebrovascular disease 2002    carotid stent  . Hyperlipidemia   . Myocardial  infarction ([place]) 10 yrs ago  . Testicular carcinoma ([place]) 1990    right orchiectomy  . AAA (abdominal aortic aneurysm) ([place]) 2017    3.3cm  . GERD (gastroesophageal reflux disease)   . Chronic combined systolic and diastolic CHF (congestive heart failure) [facility])     Past Surgical History  Procedure Laterality Date  . Appendectomy  2004  . Testicular cancer  1990    right orchiectomy  . Colonoscopy  06/17/2011    INCOMPLETE, PREP POOR. Procedure: COLONOSCOPY;  Surgeon: Daneil Dolin, MD;  [facility];  Service: Endoscopy;  Laterality: N/A;  10:00  . Esophagogastroduodenoscopy  06/17/2011    severe erosive/ulcerative reflux esophagitis, soft noncritical stricture dilatied, small hh, antral erosion   . Colonoscopy  07/15/2011    [id number] rectal and colon polyps  . Colonoscopy N/A 05/22/2015    Procedure: COLONOSCOPY;  Surgeon: Daneil Dolin, MD;  [facility];  Service: Endoscopy;  Laterality: N/A;  1300 - moved to 2:30 - office to notify  . Esophagogastroduodenoscopy N/A 05/22/2015    Procedure: ESOPHAGOGASTRODUODENOSCOPY (EGD);  Surgeon: Daneil Dolin, MD;  [facility];  Service: Endoscopy;  Laterality: N/A;  . Esophageal dilation N/A 05/22/2015    Procedure: ESOPHAGEAL DILATION;  Surgeon: Daneil Dolin, MD;  [facility];  Service: Endoscopy;  Laterality: N/A;  . Peripheral vascular catheterization N/A 05/28/2015    Procedure: Abdominal Aortogram;  Surgeon: Serafina Mitchell, MD;  Location: [place] CV  LAB;  Service: Cardiovascular;  Laterality: N/A;  . Abdominal aortic aneurysm repair N/A 06/09/2015    Procedure: ANEURYSM ABDOMINAL AORTIC REPAIR;  Surgeon: Elam Dutch, MD;  Location: [place];  Service: Vascular;  Laterality: N/A;  . Aortic/renal bypass Left 06/09/2015    Procedure: LEFT RENAL Artery BYPASS;  Surgeon: Elam Dutch, MD;  Location: [place];  Service: Vascular;  Laterality: Left;  . Aortic endarterecetomy N/A 06/09/2015     Procedure: AORTIC ENDARTERECETOMY;  Surgeon: Elam Dutch, MD;  Location: [place];  Service: Vascular;  Laterality: N/A;  . Insertion of dialysis catheter Left 06/26/2015    Procedure: INSERTION OF DIALYSIS CATHETER;  Surgeon: Angelia Mould, MD;  Location: [place];  Service: Vascular;  Laterality: Left;     Allergies: No Known Allergies   Home Medications Current Facility-Administered Medications  Medication Dose Route Frequency Provider Last Rate Last Dose  . acetaminophen (TYLENOL) tablet 325-650 mg  325-650 mg Oral Q4H PRN Elam Dutch, MD   650 mg at 07/10/15 2141   Or  . acetaminophen (TYLENOL) suppository 325-650 mg  325-650 mg Rectal Q4H PRN Elam Dutch, MD      . bisacodyl (DULCOLAX) suppository 10 mg  10 mg Rectal Daily PRN Elam Dutch, MD      . dextrose 5 %-0.45 % sodium chloride infusion   Intravenous Continuous Elam Dutch, MD 125 mL/hr at 07/12/15 2238    . docusate sodium (COLACE) capsule 100 mg  100 mg Oral BID Elam Dutch, MD   100 mg at 07/11/15 [id number]  . ferrous sulfate tablet 325 mg  325 mg Oral TID WC Elam Dutch, MD   325 mg at 07/12/15 1734  . guaiFENesin-dextromethorphan (ROBITUSSIN DM) 100-10 MG/5ML syrup 15 mL  15 mL Oral Q4H PRN Elam Dutch, MD      . iohexol (OMNIPAQUE) 300 MG/ML solution 25 mL  25 mL Oral Once PRN Elam Dutch, MD      . metoprolol (LOPRESSOR) injection 5 mg  5 mg Intravenous Q6H PRN Conrad McLean, MD   5 mg at 07/12/15 2227  . oxyCODONE-acetaminophen (PERCOCET/ROXICET) 5-325 MG per tablet 1 tablet  1 tablet Oral Q6H PRN Elam Dutch, MD   1 tablet at 07/12/15 1734  . pantoprazole (PROTONIX) EC tablet 40 mg  40 mg Oral Daily Elam Dutch, MD   40 mg at 07/12/15 [id number]  . phenol (CHLORASEPTIC) mouth spray 1 spray  1 spray Mouth/Throat PRN Elam Dutch, MD      . pneumococcal 23 valent vaccine (PNU-IMMUNE) injection 0.5 mL  0.5 mL Intramuscular Tomorrow-1000 Elam Dutch, MD      . potassium  chloride 10 mEq in 100 mL IVPB  10 mEq Intravenous Q1 Hr x 3 Conrad [place], MD   10 mEq at 07/12/15 2227  . promethazine (PHENERGAN) injection 25 mg  25 mg Intravenous Q6H PRN Conrad , MD      . sodium bicarbonate tablet 650 mg  650 mg Oral TID Elam Dutch, MD   650 mg at 07/12/15 2226     Family History  Problem Relation Age of Onset  . Colon cancer Father 49    deceased  . Prostate cancer Father   . Liver disease Neg Hx   . Anesthesia problems Neg Hx   . Hypotension Neg Hx   . Malignant hyperthermia Neg Hx   . Pseudochol deficiency Neg Hx  Social History   Social History  . Marital Status: Married    Spouse Name: N/A  . Number of Children: 2  . Years of Education: N/A   Occupational History  . disabled    Social History Main Topics  . Smoking status: Former Smoker -- 0.10 packs/day for 40 years    Types: Cigarettes    Quit date: 05/24/2015  . Smokeless tobacco: Never Used     Comment: Patient smokes up to 5 cigarettes in a week  . Alcohol Use: No  . Drug Use: No  . Sexual Activity: Yes    Birth Control/ Protection: None   Other Topics Concern  . Not on file   Social History Narrative     Review of Systems: General: fatigue no fevers/chills Cardiovascular: chest pain  Dermatological: negative for rash Respiratory: negative for wheezing  Urologic: negative for hematuria Abdominal: nausea/vomiting and diarrhea negative for bright red blood per rectum, melena, or hematemesis Neurologic: negative for visual changes, syncope, or dizziness Hematology: mild anemia Psychiatry: non suicidal/homicidal  Musculoskeletal: back pain   Physical Exam: Vitals: BP 169/88 mmHg  Pulse 115  Temp(Src) 97.9 F (36.6 C) (Oral)  Resp 20  Ht 6' (1.829 m)  Wt 78.926 kg (174 lb)  BMI 23.59 kg/m2  SpO2 99% General: not in acute distress Neck: JVP flat, neck supple Heart: irregular rate and rhythm, S1, S2, no murmurs  Lungs: CTAB  GI: non tender, non  distended, bowel sounds present Extremities: no edema Neuro: AAO x 3  Psych: normal affect, no anxiety   Labs:   Results for orders placed or performed during the hospital encounter of 07/09/15 (from the past 24 hour(s))  Basic metabolic panel     Status: Abnormal   Collection Time: 07/12/15  3:36 AM  Result Value Ref Range   Sodium 136 135 - 145 mmol/L   Potassium 3.0 (L) 3.5 - 5.1 mmol/L   Chloride 107 101 - 111 mmol/L   CO2 19 (L) 22 - 32 mmol/L   Glucose, Bld 107 (H) 65 - 99 mg/dL   BUN 47 (H) 6 - 20 mg/dL   Creatinine, Ser 5.57 (H) 0.61 - 1.24 mg/dL   Calcium 8.4 (L) 8.9 - 10.3 mg/dL   GFR calc non Af Amer 10 (L) >60 mL/min   GFR calc Af Amer 11 (L) >60 mL/min   Anion gap 10 5 - 15  Protime-INR     Status: Abnormal   Collection Time: 07/12/15  3:36 AM  Result Value Ref Range   Prothrombin Time 28.0 (H) 11.6 - 15.2 seconds   INR 2.67 (H) 0.00 - 1.49  CBC     Status: Abnormal   Collection Time: 07/12/15  3:36 AM  Result Value Ref Range   WBC 12.8 (H) 4.0 - 10.5 K/uL   RBC 3.17 (L) 4.22 - 5.81 MIL/uL   Hemoglobin 8.7 (L) 13.0 - 17.0 g/dL   HCT 27.3 (L) 39.0 - 52.0 %   MCV 86.1 78.0 - 100.0 fL   MCH 27.4 26.0 - 34.0 pg   MCHC 31.9 30.0 - 36.0 g/dL   RDW 14.9 11.5 - 15.5 %   Platelets 272 150 - 400 K/uL  Glucose, capillary     Status: None   Collection Time: 07/12/15  9:09 PM  Result Value Ref Range   Glucose-Capillary 95 65 - 99 mg/dL  Troponin I     Status: Abnormal   Collection Time: 07/12/15 10:23 PM  Result Value Ref Range  Troponin I 0.04 (H) <0.031 ng/mL     Radiology/Studies: Ct Abdomen Pelvis Wo Contrast  07/11/2015  CLINICAL DATA:  65 year old male with nausea and vomiting. Evaluate for obstruction or colitis. Patient is status post repair of aortic pseudoaneurysm with left renal bypass. EXAM: CT ABDOMEN AND PELVIS WITHOUT CONTRAST TECHNIQUE: Multidetector CT imaging of the abdomen and pelvis was performed following the standard protocol without IV  contrast. COMPARISON:  Prior CTA abdomen 05/26/2015 FINDINGS: Lower chest: Small bilateral layering pleural effusions. Associated mild lower lobe atelectasis. Interval development of all multifocal irregular nodular opacities in a peribronchovascular distribution throughout the visualized portion of both lower lungs. These are new compared to January of 2017 and favored to represent an active infectious/inflammatory process. Stable cardiac size with mild left ventricular dilatation. No pericardial effusion. Unremarkable visualized distal thoracic esophagus. Hepatobiliary: Normal hepatic contour and morphology. No discrete hepatic lesion. Gallbladder is unremarkable. No intra or extrahepatic biliary ductal dilatation. Pancreas: No mass or inflammatory process identified on this un-enhanced exam. Spleen: Within normal limits in size. Adrenals/Urinary Tract: Similar appearance of 1.4 cm nodule in the left adrenal gland. Right renal atrophy is again noted. Nonobstructing nephrolithiasis in the left upper pole. Stomach/Bowel: No evidence of obstruction or focal bowel wall thickening. The terminal ileum is unremarkable. The appendix is surgically absent. There is a circumscribed water attenuation fluid collection within the mesenteries of the small bowel left of midline adjacent to the surgical bed measuring approximately 3.9 by 5.2 by 3.2 cm. This abuts the bowel surface and exerts local mass effect but does not appear to be obstructing given the absence of dilatation proximally. This fluid collection could also be within the small bowel wall although this is felt less likely. Vascular/Lymphatic: No pathologically enlarged lymph nodes. Surgical changes of repair of the suprarenal aortic pseudoaneurysm with Dacron graft in surrounding the felt pledget. Left renal artery bypass graft is somewhat difficult to visualize in the absence of intravenous contrast. Reproductive: No mass or other significant abnormality. Other:  Expected postoperative changes in the retroperitoneum consistent with de chronic graft repair of the aortic pseudoaneurysm and de necrotic graft bypass of the left renal artery. There is mild nonspecific left perinephric edema. Musculoskeletal:  No suspicious bone lesions identified. IMPRESSION: 1. Interval development of a 3.9 x 5.2 x 3.2 cm circumscribed ovoid fluid collection at the junction of the small bowel wall and small bowel mesentery in the mid abdomen just left of midline in the region of the retroperitoneal operative field. This fluid collection exerts local mass effect on the small bowel but is not obstructing. There is no dilation of small bowel proximally and the ingested oral contrast material has passed distal to this region. This is likely an incidental finding and may not be related to the patient's clinical symptoms. Statistically, this most likely represents a postoperative seroma or lymphocele. Infection is difficult to exclude entirely but there are no suspicious imaging features. 2. Expected postoperative changes of Dacron graft aortic repair and left renal arterial bypass with expected retroperitoneal and perinephric stranding. Patency of the left renal graft cannot be assessed in the absence of intravenous contrast. 3. Mild left renal edema and perinephric stranding. 4. Interval development of numerous nodular opacities in the visualized lung bases in a peribronchovascular distribution. This is most consistent with an active infectious or inflammatory process including multifocal pneumonia, or aspiration given the clinical history of frequent vomiting. 5. Small bilateral layering pleural effusions. 6. Stable right renal atrophy. Electronically Signed   By:  Jacqulynn Cadet M.D.   On: 07/11/2015 15:32   Dg Abd 1 View  06/20/2015  CLINICAL DATA:  Post abdominal aortic aneurysm repair and LEFT renal artery bypass, leukocytosis, history smoking, hypertension, atrial fibrillation, coronary  artery disease post MI, testicular cancer EXAM: ABDOMEN - 1 VIEW COMPARISON:  Portable exam 0644 hours compared to 06/16/2015 FINDINGS: Post laparotomy. Normal bowel gas pattern. No bowel dilatation or bowel wall thickening. Osseous structures normal. No urinary tract calcification. Visualized lung bases clear. IMPRESSION: Normal bowel gas pattern. Electronically Signed   By: Lavonia Dana M.D.   On: 06/20/2015 08:19   Dg Abd 1 View  06/15/2015  CLINICAL DATA:  Abdominal tenderness, decreased bowel sounds. No recent bowel movements. Concern for ileus. EXAM: ABDOMEN - 1 VIEW COMPARISON:  Abdomen plain film dated 06/13/2015. FINDINGS: Abdominal wall surgical staples again noted. Enteric tube in the stomach. There is a paucity of bowel gas. Overall bowel gas pattern is nonobstructive. No evidence of free intraperitoneal air seen. No evidence of abnormal fluid collection. IMPRESSION: No radiographic evidence of bowel obstruction or ileus, although a paucity of bowel gas limits characterization. Enteric tube tip in the stomach, but with proximal side holes at or just below the level of the gastroesophageal junction. Would consider advancing for optimal radiographic positioning. Electronically Signed   By: Franki Cabot M.D.   On: 06/15/2015 12:45   Dg Chest Port 1 View  06/26/2015  CLINICAL DATA:  Diatek insertion EXAM: PORTABLE CHEST 1 VIEW COMPARISON:  06/20/2015 FINDINGS: Right IJ and left IJ catheters, both tips at the SVC level. No apical pneumothorax or new mediastinal widening. Normal heart size. New diffuse interstitial coarsening with bronchial cuffing. IMPRESSION: 1. Unremarkable appearance of central catheters. 2. Pulmonary edema. Electronically Signed   By: Monte Fantasia M.D.   On: 06/26/2015 11:27   Dg Chest Port 1 View  06/20/2015  CLINICAL DATA:  Respiratory failure, abdominal aortic aneurysm repair 11 days ago EXAM: PORTABLE CHEST 1 VIEW COMPARISON:  Portable chest x-ray of June 15, 2015  FINDINGS: The lungs are adequately inflated and clear the endotracheal tube and esophagogastric tube have been removed. The heart is mildly enlarged but stable. The pulmonary vascularity is not engorged. The mediastinum is normal in width. There is no significant pleural effusion demonstrated. The bilateral internal jugular catheters have their tips overlying the proximal SVC. IMPRESSION: Interval extubation of the trachea. Top-normal cardiac size without pulmonary vascular congestion. There is no evidence of pneumonia or significant atelectasis. Electronically Signed   By: David  Martinique M.D.   On: 06/20/2015 07:14   Dg Chest Port 1 View  06/15/2015  CLINICAL DATA:  65 year old male with respiratory failure. EXAM: PORTABLE CHEST 1 VIEW COMPARISON:  06/14/2015 and prior exams FINDINGS: Cardiomediastinal silhouette is unchanged. An endotracheal tube with tip 4.4 cm above carina, right IJ central venous catheter with tip overlying mid SVC, left IJ central venous catheter with tip overlying mid SVC, and NG tube entering the stomach with tip off the field of view again noted. Minimal left basilar atelectasis is again noted. There is no evidence of pulmonary edema or pneumothorax. IMPRESSION: Little significant change with minimal left basilar atelectasis and support apparatus as described. Electronically Signed   By: Margarette Canada M.D.   On: 06/15/2015 08:25   Dg Chest Port 1 View  06/14/2015  CLINICAL DATA:  Hypoxia EXAM: PORTABLE CHEST 1 VIEW COMPARISON:  June 13, 2015 FINDINGS: Endotracheal tube tip is 4.4 cm above the carina. Left and right  jugular catheter tips are both in the superior vena cava. Nasogastric tube tip and side port is below the diaphragm. No pneumothorax. There is no edema or consolidation. The heart size and pulmonary vascularity are normal. No adenopathy. Skin staples overlie the lower chest and visualized upper abdominal regions. IMPRESSION: Tube and catheter positions as described without  pneumothorax. No edema or consolidation. Electronically Signed   By: Lowella Grip III M.D.   On: 06/14/2015 07:46   Dg Chest Port 1 View  06/13/2015  CLINICAL DATA:  Right central venous line placement, intubation EXAM: PORTABLE CHEST 1 VIEW COMPARISON:  06/12/15 FINDINGS: Endotracheal tube tip 4.3 cm above the carina. Right IJ central line tip over the superior vena cava. No change left central line. No pneumothorax. Mild enlargement of cardiac silhouette similar to prior study. Mild patchy airspace disease left upper lobe of uncertain significance. This is new from the prior study. Decreased left pleural effusion. Decreased right pleural effusion. Persistent but improved airspace opacity in the right central lung. IMPRESSION: No pneumothorax. Endotracheal tube as described. Bilateral effusions and infiltrates as described above Electronically Signed   By: Skipper Cliche M.D.   On: 06/13/2015 11:26   Dg Abd Acute W/chest  07/09/2015  CLINICAL DATA:  Nausea, vomiting last night. EXAM: DG ABDOMEN ACUTE W/ 1V CHEST COMPARISON:  Chest x-ray 06/26/2015 FINDINGS: Left dialysis catheter remains in place, unchanged. Interval removal of right internal jugular central line. Heart is normal size. Bibasilar opacities, likely atelectasis with small bilateral effusions. Nonobstructive bowel gas pattern. No free air organomegaly. No suspicious calcification. No acute bony abnormality. IMPRESSION: No evidence of bowel obstruction or free air. Bibasilar atelectasis, small effusions. Electronically Signed   By: Rolm Baptise M.D.   On: 07/09/2015 10:56   Dg Abd Portable 1v  06/20/2015  CLINICAL DATA:  Feeding tube placement EXAM: PORTABLE ABDOMEN - 1 VIEW COMPARISON:  Study obtained earlier in the day FINDINGS: Feeding tube tip in region of second portion of duodenum. Bowel gas pattern unremarkable. There are skin staples in the midline. Lung bases appear clear. IMPRESSION: Feeding tube tip in region of second portion of  duodenum. Bowel gas pattern unremarkable. Electronically Signed   By: Lowella Grip III M.D.   On: 06/20/2015 11:00   Dg Abd Portable 1v  06/16/2015  CLINICAL DATA:  Absent bowel sounds question ileus EXAM: PORTABLE ABDOMEN - 1 VIEW COMPARISON:  Portable exam 1106 hours compared to 06/15/2015 FINDINGS: Surgical clips at midline post laparotomy. Tip of nasogastric tube projects over proximal stomach with proximal side-port at approximately the GE junction. Air-filled nondistended loops of bowel in the abdomen. No gross evidence of bowel wall thickening or obstruction. Bones unremarkable. IMPRESSION: Nonspecific bowel gas pattern. Electronically Signed   By: Lavonia Dana M.D.   On: 06/16/2015 11:32   Dg Abd Portable 1v  06/13/2015  CLINICAL DATA:  Question ileus. Status post aortic aneurysm repair 06/09/2015. EXAM: PORTABLE ABDOMEN - 1 VIEW COMPARISON:  Single view of the abdomen 06/09/2015. FINDINGS: Surgical staples in the midline are again seen. The bowel gas pattern is unremarkable. No focal bony abnormality is identified. IMPRESSION: Negative for bowel obstruction or ileus. Electronically Signed   By: Inge Rise M.D.   On: 06/13/2015 11:20    EKG: today showed atrial flutter with variable block with rapid ventricular response, vent rate was 122 and PVCs   Echo: 06/2015 Left ventricle: The cavity size was normal. Systolic function was  moderately reduced. The estimated ejection fraction was in  the  range of 35% to 40%. - Pulmonary arteries: PA peak pressure: 33 mm Hg (S). - Inferior vena cava: The vessel was dilated. The respirophasic  diameter changes were blunted (< 50%). This is a nonspecific  finding during positive pressure ventilation.  Cardiac cath: not available  Medical decision making:  Discussed care with the patient Discussed care with the physician on the phone Reviewed labs and imaging personally Reviewed prior records  ASSESSMENT AND PLAN:  This is a 65 y.o.  male known CAD, permanent atrial fibrillation on coumadin, HTN, ischemic cardiomyopathy (LVEF 35%), developed afib with RVR today and chest pain.    Active Problems:   Dehydration, severe  Permanent atrial fibrillation with RVR, currently rate controlled, not rhythm controlled Non valvular atrial fibrillation, likely trigger was nausea/vomiting  Prior history of CHF, hypertension, and vascular disease (CHADS2VASC = 3, CHADS2 = 2) Maintained on coumadin for anticoagulation, LA size is dilated by last echo Started on metoprolol 50 mg BID  TSH wnl  Keep potassium >4, calcium >9, magnesium >2 Aspirin for secondary prevention of coronary artery disease   Chest pain with high risk of cardiac etiology Cycle troponin Serial EKGs Risk stratify with cardiac stress test  NPO post midnight for Monday  Lipid panel, HbA1c, TSH Aspirin and atorvastatin 80 mg daily  Hypertension Started on metoprolol 50 mg BID, may need to add another agent if SBP >160   Hypomagnesemia Replacing magnesium  Acute diarrhea Ordered c difficile  Signed, Flossie Dibble, MD MS 07/12/2015, 11:55 PM

## 2015-07-12 NOTE — Progress Notes (Signed)
MD paged at [phone]. Awaiting return call. Dr office paged at [phone].  Raliegh Ip RN

## 2015-07-13 ENCOUNTER — Inpatient Hospital Stay (HOSPITAL_COMMUNITY): Payer: Medicare Other

## 2015-07-13 DIAGNOSIS — N184 Chronic kidney disease, stage 4 (severe): Secondary | ICD-10-CM

## 2015-07-13 DIAGNOSIS — R072 Precordial pain: Secondary | ICD-10-CM | POA: Insufficient documentation

## 2015-07-13 DIAGNOSIS — I25119 Atherosclerotic heart disease of native coronary artery with unspecified angina pectoris: Secondary | ICD-10-CM

## 2015-07-13 DIAGNOSIS — I4891 Unspecified atrial fibrillation: Secondary | ICD-10-CM | POA: Insufficient documentation

## 2015-07-13 LAB — C DIFFICILE QUICK SCREEN W PCR REFLEX
C Diff antigen: POSITIVE — AB
C Diff interpretation: POSITIVE
C Diff toxin: POSITIVE — AB

## 2015-07-13 LAB — HEPATIC FUNCTION PANEL
ALT: 10 U/L — ABNORMAL LOW (ref 17–63)
AST: 11 U/L — ABNORMAL LOW (ref 15–41)
Albumin: 2.1 g/dL — ABNORMAL LOW (ref 3.5–5.0)
Alkaline Phosphatase: 82 U/L (ref 38–126)
Bilirubin, Direct: 0.2 mg/dL (ref 0.1–0.5)
Indirect Bilirubin: 0.5 mg/dL (ref 0.3–0.9)
Total Bilirubin: 0.7 mg/dL (ref 0.3–1.2)
Total Protein: 7.2 g/dL (ref 6.5–8.1)

## 2015-07-13 LAB — GASTROINTESTINAL PANEL BY PCR, STOOL (REPLACES STOOL CULTURE)

## 2015-07-13 LAB — BASIC METABOLIC PANEL
Anion gap: 15 (ref 5–15)
BUN: 36 mg/dL — ABNORMAL HIGH (ref 6–20)
CO2: 16 mmol/L — ABNORMAL LOW (ref 22–32)
Calcium: 8.4 mg/dL — ABNORMAL LOW (ref 8.9–10.3)
Chloride: 105 mmol/L (ref 101–111)
Creatinine, Ser: 4.79 mg/dL — ABNORMAL HIGH (ref 0.61–1.24)
GFR calc Af Amer: 14 mL/min — ABNORMAL LOW (ref >60)
GFR calc non Af Amer: 12 mL/min — ABNORMAL LOW (ref >60)
Glucose, Bld: 114 mg/dL — ABNORMAL HIGH (ref 65–99)
Potassium: 3.1 mmol/L — ABNORMAL LOW (ref 3.5–5.1)
Sodium: 136 mmol/L (ref 135–145)

## 2015-07-13 LAB — MAGNESIUM: Magnesium: 2.2 mg/dL (ref 1.7–2.4)

## 2015-07-13 LAB — TROPONIN I
Troponin I: 0.05 ng/mL — ABNORMAL HIGH (ref <0.031)
Troponin I: 0.04 ng/mL — ABNORMAL HIGH (ref <0.031)

## 2015-07-13 LAB — AMYLASE
Amylase: 37 U/L (ref 28–100)
Amylase: 33 U/L (ref 28–100)

## 2015-07-13 LAB — LIPASE, BLOOD
Lipase: 42 U/L (ref 11–51)
Lipase: 44 U/L (ref 11–51)

## 2015-07-13 LAB — PROTIME-INR
INR: 2.81 — ABNORMAL HIGH (ref 0.00–1.49)
Prothrombin Time: 29.2 seconds — ABNORMAL HIGH (ref 11.6–15.2)

## 2015-07-13 MED ORDER — AMLODIPINE BESYLATE 5 MG PO TABS
5.0000 mg | ORAL_TABLET | Freq: Every day | ORAL | Status: DC
  Administered 2015-07-13 – 2015-07-21 (×9): 5 mg via ORAL
  Filled 2015-07-13 (×9): qty 1

## 2015-07-13 MED ORDER — METOPROLOL TARTRATE 50 MG PO TABS
50.0000 mg | ORAL_TABLET | Freq: Two times a day (BID) | ORAL | Status: DC
  Administered 2015-07-13: 50 mg via ORAL
  Filled 2015-07-13: qty 1

## 2015-07-13 MED ORDER — ASPIRIN 81 MG PO CHEW
81.0000 mg | CHEWABLE_TABLET | Freq: Every day | ORAL | Status: DC
  Administered 2015-07-13 – 2015-07-21 (×9): 81 mg via ORAL
  Filled 2015-07-13 (×9): qty 1

## 2015-07-13 MED ORDER — METOPROLOL SUCCINATE ER 100 MG PO TB24
100.0000 mg | ORAL_TABLET | Freq: Every day | ORAL | Status: DC
  Administered 2015-07-13 – 2015-07-21 (×9): 100 mg via ORAL
  Filled 2015-07-13 (×9): qty 1

## 2015-07-13 MED ORDER — ATORVASTATIN CALCIUM 80 MG PO TABS
80.0000 mg | ORAL_TABLET | Freq: Every day | ORAL | Status: DC
  Administered 2015-07-13 – 2015-07-21 (×8): 80 mg via ORAL
  Filled 2015-07-13 (×6): qty 1
  Filled 2015-07-13: qty 2
  Filled 2015-07-13 (×2): qty 1

## 2015-07-13 MED ORDER — METRONIDAZOLE IN NACL 5-0.79 MG/ML-% IV SOLN
500.0000 mg | Freq: Three times a day (TID) | INTRAVENOUS | Status: DC
  Administered 2015-07-13 – 2015-07-21 (×25): 500 mg via INTRAVENOUS
  Filled 2015-07-13 (×25): qty 100

## 2015-07-13 MED ORDER — MAGNESIUM SULFATE 2 GM/50ML IV SOLN
2.0000 g | Freq: Once | INTRAVENOUS | Status: AC
  Administered 2015-07-13: 2 g via INTRAVENOUS
  Filled 2015-07-13: qty 50

## 2015-07-13 NOTE — Progress Notes (Signed)
Primary cardiologist: Dr. Kate Sable  Seen for followup: CAD, CP  Subjective:    States that he feels better this morning. Had a feeling of chest pressure last evening also associated with nausea and emesis which has been his main complaint in the last few days.  Objective:   Temp:  [97.5 F (36.4 C)-97.9 F (36.6 C)] 97.6 F (36.4 C) (03/12 0454) Pulse Rate:  [95-127] 124 (03/12 0454) Resp:  [18-20] 18 (03/12 0454) BP: (154-186)/(74-103) 154/90 mmHg (03/12 0454) SpO2:  [99 %-100 %] 99 % (03/12 0454) Weight:  [174 lb (78.926 kg)] 174 lb (78.926 kg) (03/11 0840) Last BM Date: 07/12/15  Filed Weights   07/09/15 1003 07/12/15 0840  Weight: 176 lb 14.4 oz (80.241 kg) 174 lb (78.926 kg)    Intake/Output Summary (Last 24 hours) at 07/13/15 0805 Last data filed at 07/13/15 0454  Gross per 24 hour  Intake    240 ml  Output    626 ml  Net   -386 ml    Telemetry: Atrial fibrillation with occasional PVCs versus aberrantly conducted complexes.  Exam:  General: Chronically ill-appearing male in no distress.  HEENT: Poor dentition.  Lungs: Coarse breath sounds, decreased.  Cardiac: Irregularly irregular, no gallop.  Abdomen: NABS, mildly tender, no rebound.  Extremities: No pitting edema.  Lab Results:  Basic Metabolic Panel:  Recent Labs Lab 07/07/15 0241  07/11/15 0240 07/12/15 0336 07/13/15 0417  NA 141  < > 137 136 136  K 4.1  < > 3.1* 3.0* 3.1*  CL 103  < > 106 107 105  CO2 20*  < > 19* 19* 16*  GLUCOSE 128*  < > 111* 107* 114*  BUN 109*  < > 56* 47* 36*  CREATININE 10.73*  < > 6.53* 5.57* 4.79*  CALCIUM 8.6*  < > 8.7* 8.4* 8.4*  MG 1.4*  --   --   --  2.2  < > = values in this interval not displayed.  Liver Function Tests:  Recent Labs Lab 07/07/15 0241 07/09/15 1007  AST  --  11*  10*  ALT  --  16*  16*  ALKPHOS  --  84  85  BILITOT  --  0.6  0.5  PROT  --  7.7  7.8  ALBUMIN 1.9* 2.3*  2.3*    CBC:  Recent Labs Lab  07/09/15 1007 07/10/15 0845 07/12/15 0336  WBC 7.6 8.2 12.8*  HGB 9.4* 8.8* 8.7*  HCT 28.9* 26.6* 27.3*  MCV 86.8 86.1 86.1  PLT 303 251 272    Cardiac Enzymes:  Recent Labs Lab 07/12/15 2223 07/13/15 0417  TROPONINI 0.04* 0.05*    Coagulation:  Recent Labs Lab 07/11/15 0240 07/12/15 0336 07/13/15 0417  INR 5.41* 2.67* 2.81*    ECG: I personally reviewed the tracing from 07/12/2015 which showed atrial fibrillation with IVCD, old inferior infarct pattern, and nonspecific ST segment changes.  Echocardiogram 06/13/2015: Study Conclusions  - Left ventricle: The cavity size was normal. Systolic function was  moderately reduced. The estimated ejection fraction was in the  range of 35% to 40%. - Pulmonary arteries: PA peak pressure: 33 mm Hg (S). - Inferior vena cava: The vessel was dilated. The respirophasic  diameter changes were blunted (< 50%). This is a nonspecific  finding during positive pressure ventilation.  Impressions:  - Technically limited study due to poor echo windows and  tachycardia. Consider TEE if more accurate evaluation is  necessary.   Medications:  Scheduled Medications: . aspirin  81 mg Oral Daily  . atorvastatin  80 mg Oral q1800  . docusate sodium  100 mg Oral BID  . ferrous sulfate  325 mg Oral TID WC  . metoprolol tartrate  50 mg Oral BID  . pantoprazole  40 mg Oral Daily  . pneumococcal 23 valent vaccine  0.5 mL Intramuscular Tomorrow-1000  . sodium bicarbonate  650 mg Oral TID     Infusions: . dextrose 5 % and 0.45% NaCl 125 mL/hr at 07/12/15 2238     PRN Medications:  acetaminophen **OR** acetaminophen, bisacodyl, guaiFENesin-dextromethorphan, iohexol, metoprolol, oxyCODONE-acetaminophen, phenol, promethazine   Assessment:   1. Recent episode of chest pain in the setting of nausea and emesis. Angina certainly possible given substrate. Cardiac markers are equivocal and not clearly consistent with ACS. ECG  chronically abnormal with repolarization abnormalities as well.  2. CAD based on prior assessment in 2008 at [facility] at which time he had an occluded RCA and underwent DES to the obtuse marginal. He is currently on aspirin, Lopressor, and Lipitor. No recent ischemic testing. He has evidence of ischemic cardiomyopathy by recent echocardiogram.  3. Chronic atrial fibrillation, typically on Coumadin however this is held at the present time pending further evaluation of other active issues. INR still therapeutic at 2.8.  4. Status post repair of juxtarenal abdominal aortic aneurysm with left renal artery bypass in February of this year.  5. Renal failure requiring temporary hemodialysis. Present creatinine 4.7.   Plan/Discussion:    Recommend medical therapy adjustments aimed at angina control as patient is poor candidate for invasive cardiac evaluation in light of his recent renal failure and persistent CKD stage IV. Plan to increase beta blocker dose for better heart rate control and also add Norvasc for blood pressure and angina control. Cannot pursue an ACE inhibitor or ARB due to degree of renal insufficiency. Although follow-up ischemic testing via Myoview is certainly a consideration, not sure that this would further advance our plan since it will almost certainly be abnormal, and a cardiac catheterization would not be pursued at this particular time.   Satira Sark, M.D., F.A.C.C.

## 2015-07-13 NOTE — Progress Notes (Signed)
Pt is positive for cdiff, placed on enteric precaution, pt education given. Will continue to monitor

## 2015-07-13 NOTE — Progress Notes (Signed)
Dr Bridgett Larsson just called back. Ordered some potassium and metoprolol IV for BP greater than 160. Bp still elevated.

## 2015-07-13 NOTE — Progress Notes (Signed)
Patient called complaining of Chest tightness, diaphoretic and SOB. Rapid response team called. Patient relieved with one nitro.

## 2015-07-13 NOTE — Progress Notes (Addendum)
[facility] [place]  Subjective  - He continues to void multiple times per day and has had multiple loose BM.   N/V continue as well.   Objective 154/90 124 97.6 F (36.4 C) (Oral) 18 99%  Intake/Output Summary (Last 24 hours) at 07/13/15 0850 Last data filed at 07/13/15 0454  Gross per 24 hour  Intake    240 ml  Output    626 ml  Net   -386 ml   Abdomin soft positive BS Feet warm and well perfused Ill appearing NAD    Assessment/Planning: 06/09/2015 Repair of juxtarenal abdominal aortic aneurysm, left renal artery bypass  Continued N/V/D positive for C-diff will start Flagyl IV   2 view DG abdomin ordered INR 2.8 was 5.4 2 days ago.  He has not been given coumadin dosing since his admission on 07/09/2015. Cr continues to decrease now 4.79,   Cardiology consult yesterday secondary to angina reports per patient:  Recommendations and plan: Recommend medical therapy adjustments aimed at angina control as patient is poor candidate for invasive cardiac evaluation in light of his recent renal failure and persistent CKD stage IV. Plan to increase beta blocker dose for better heart rate control and also add Norvasc for blood pressure and angina control. Cannot pursue an ACE inhibitor or ARB due to degree of renal insufficiency. Although follow-up ischemic testing via Myoview is certainly a consideration, not sure that this would further advance our plan since it will almost certainly be abnormal, and a cardiac catheterization would not be pursued at this particular time.  Started on Norvasc 5 mg and Metoprolol 100 mg daily by Cardiology  Theda Sers [place] 07/13/2015 8:50 AM --  Laboratory Lab Results:  Recent Labs  07/12/15 0336  WBC 12.8*  HGB 8.7*  HCT 27.3*  PLT 272   BMET  Recent Labs  07/12/15 0336 07/13/15 0417  NA 136 136  K 3.0* 3.1*  CL 107 105  CO2 19* 16*  GLUCOSE 107* 114*  BUN 47* 36*  CREATININE 5.57* 4.79*  CALCIUM 8.4*  8.4*    COAG Lab Results  Component Value Date   INR 2.81* 07/13/2015   INR 2.67* 07/12/2015   INR 5.41* 07/11/2015   No results found for: PTT  Addendum  I have independently interviewed and examined the patient, and I agree with the physician assistant's findings.  Continued N/V despite schedule phenergan (initially some benefit).  CDiff +.  Pt's abd exam is not concerning for megacolon.  Appreciate Cardiology's help with this patient's CAD and afib  - Check LFT, Amylase, Lipase, Flat & upright abd X-ray.   - Flagyl 500 mg IV q8 for now as still nauseated.  I doubt he could tolerate oral Vancomycin   Adele Barthel, MD [facility] [place] Office: [phone] Pager: [phone]  07/13/2015, 9:43 AM

## 2015-07-13 NOTE — Progress Notes (Signed)
Patient has had multiple loose stools during the day. Nurse initiated enteric precaution. Patient educated. Nurse aske Cardiologist consulting to order text for C.diff. Order received.

## 2015-07-13 NOTE — Progress Notes (Signed)
Cardiologist consulted, Came and saw patient, nurse mentioned to cardiologist about patient low magnesium 1.4,  IV Magnesium ordered.

## 2015-07-14 ENCOUNTER — Inpatient Hospital Stay (HOSPITAL_COMMUNITY): Admission: RE | Admit: 2015-07-14 | Payer: Medicare Other | Source: Ambulatory Visit

## 2015-07-14 DIAGNOSIS — E860 Dehydration: Principal | ICD-10-CM

## 2015-07-14 LAB — BASIC METABOLIC PANEL
Anion gap: 13 (ref 5–15)
BUN: 34 mg/dL — ABNORMAL HIGH (ref 6–20)
CO2: 17 mmol/L — ABNORMAL LOW (ref 22–32)
Calcium: 8.5 mg/dL — ABNORMAL LOW (ref 8.9–10.3)
Chloride: 106 mmol/L (ref 101–111)
Creatinine, Ser: 4.48 mg/dL — ABNORMAL HIGH (ref 0.61–1.24)
GFR calc Af Amer: 15 mL/min — ABNORMAL LOW (ref >60)
GFR calc non Af Amer: 13 mL/min — ABNORMAL LOW (ref >60)
Glucose, Bld: 115 mg/dL — ABNORMAL HIGH (ref 65–99)
Potassium: 2.6 mmol/L — CL (ref 3.5–5.1)
Sodium: 136 mmol/L (ref 135–145)

## 2015-07-14 LAB — PROTIME-INR
INR: 3.27 — ABNORMAL HIGH (ref 0.00–1.49)
Prothrombin Time: 32.7 seconds — ABNORMAL HIGH (ref 11.6–15.2)

## 2015-07-14 LAB — HEMOGLOBIN AND HEMATOCRIT, BLOOD
HCT: 27.9 % — ABNORMAL LOW (ref 39.0–52.0)
Hemoglobin: 8.9 g/dL — ABNORMAL LOW (ref 13.0–17.0)

## 2015-07-14 MED ORDER — POTASSIUM CHLORIDE 10 MEQ/100ML IV SOLN
INTRAVENOUS | Status: AC
  Filled 2015-07-14: qty 100

## 2015-07-14 MED ORDER — POTASSIUM CHLORIDE 10 MEQ/100ML IV SOLN
INTRAVENOUS | Status: AC
  Administered 2015-07-14: 10 meq
  Filled 2015-07-14: qty 100

## 2015-07-14 MED ORDER — POTASSIUM CHLORIDE 10 MEQ/100ML IV SOLN
10.0000 meq | INTRAVENOUS | Status: AC
  Administered 2015-07-14 (×4): 10 meq via INTRAVENOUS
  Filled 2015-07-14 (×3): qty 100

## 2015-07-14 NOTE — Plan of Care (Signed)
Problem: Tissue Perfusion: Goal: Risk factors for ineffective tissue perfusion will decrease Outcome: Progressing Patient has not experienced any chest pain.

## 2015-07-14 NOTE — Progress Notes (Addendum)
[facility] [place]  Subjective  - Resting well.   Objective 149/76 86 97.4 F (36.3 C) (Oral) 16 98%  Intake/Output Summary (Last 24 hours) at 07/14/15 0723 Last data filed at 07/14/15 0054  Gross per 24 hour  Intake    120 ml  Output    600 ml  Net   -480 ml      Assessment/Planning: 06/09/2015 Repair of juxtarenal abdominal aortic aneurysm, left renal artery bypass Cr decreasing no 4.48.  We are suppose to take out the IJ catheter while he is here. LFT AST 11, ALT 10 INR elevated from 3.27 to 2.81 Chest pain: 07/12/2015  Cardiology consult yesterday secondary to angina reports per patient.  Troponin i 0.05, 0.04 Albumin 2.1 malnutrition continues N/V over the weekend CT no illus     [place], EMMA [facility] 07/14/2015 7:23 AM --  Laboratory Lab Results:  Recent Labs  07/12/15 0336  WBC 12.8*  HGB 8.7*  HCT 27.3*  PLT 272   BMET  Recent Labs  07/13/15 0417 07/14/15 0321  NA 136 136  K 3.1* 2.6*  CL 105 106  CO2 16* 17*  GLUCOSE 114* 115*  BUN 36* 34*  CREATININE 4.79* 4.48*  CALCIUM 8.4* 8.5*    COAG Lab Results  Component Value Date   INR 3.27* 07/14/2015   INR 2.81* 07/13/2015   INR 2.67* 07/12/2015   No results found for: PTT    I have examined the patient, reviewed and agree with above.Abd soft. Non tender.  Still with nausea  Curt Jews, MD 07/14/2015 6:53 PM

## 2015-07-14 NOTE — Progress Notes (Signed)
Lab called for critical potassium on patient at 2.6. Continue to have diarrhea. MD on call Dr. Bridgett Larsson notified. Telephone order to give four runs of 44meq of potassium.

## 2015-07-14 NOTE — Progress Notes (Signed)
Utilization review completed.  

## 2015-07-14 NOTE — Progress Notes (Addendum)
Primary cardiologist: Dr. Kate Sable  Seen for followup: CAD, CP  Subjective:    States that he feels better this morning. Had a feeling of chest pressure last evening also associated with nausea and emesis which has been his main complaint in the last few days.  No further chest pain  Has significant CAD and PVD. Still smokes.  Advised him to stop   Objective:   Temp:  [97.4 F (36.3 C)-97.6 F (36.4 C)] 97.4 F (36.3 C) (03/13 0420) Pulse Rate:  [86-93] 86 (03/13 0420) Resp:  [16-18] 16 (03/13 0420) BP: (146-157)/(72-85) 149/76 mmHg (03/13 0420) SpO2:  [98 %] 98 % (03/13 0420) Last BM Date: 07/14/15  Filed Weights   07/09/15 1003 07/12/15 0840  Weight: 176 lb 14.4 oz (80.241 kg) 174 lb (78.926 kg)    Intake/Output Summary (Last 24 hours) at 07/14/15 0918 Last data filed at 07/14/15 0054  Gross per 24 hour  Intake      0 ml  Output    600 ml  Net   -600 ml    Telemetry: Atrial fibrillation with occasional PVCs versus aberrantly conducted complexes.  Exam:  General: Chronically ill-appearing male in no distress.  HEENT: Poor dentition.  Lungs: Coarse breath sounds, decreased.  Cardiac: Irregularly irregular, no gallop.  Abdomen: NABS, mildly tender, no rebound.  Extremities: No pitting edema.  Lab Results:  Basic Metabolic Panel:  Recent Labs Lab 07/12/15 0336 07/13/15 0417 07/14/15 0321  NA 136 136 136  K 3.0* 3.1* 2.6*  CL 107 105 106  CO2 19* 16* 17*  GLUCOSE 107* 114* 115*  BUN 47* 36* 34*  CREATININE 5.57* 4.79* 4.48*  CALCIUM 8.4* 8.4* 8.5*  MG  --  2.2  --     Liver Function Tests:  Recent Labs Lab 07/09/15 1007 07/13/15 1025  AST 11*  10* 11*  ALT 16*  16* 10*  ALKPHOS 84  85 82  BILITOT 0.6  0.5 0.7  PROT 7.7  7.8 7.2  ALBUMIN 2.3*  2.3* 2.1*    CBC:  Recent Labs Lab 07/09/15 1007 07/10/15 0845 07/12/15 0336 07/14/15 0730  WBC 7.6 8.2 12.8*  --   HGB 9.4* 8.8* 8.7* 8.9*  HCT 28.9* 26.6* 27.3*  27.9*  MCV 86.8 86.1 86.1  --   PLT 303 251 272  --     Cardiac Enzymes:  Recent Labs Lab 07/12/15 2223 07/13/15 0417 07/13/15 1025  TROPONINI 0.04* 0.05* 0.04*    Coagulation:  Recent Labs Lab 07/12/15 0336 07/13/15 0417 07/14/15 0321  INR 2.67* 2.81* 3.27*    ECG: I personally reviewed the tracing from 07/12/2015 which showed atrial fibrillation with IVCD, old inferior infarct pattern, and nonspecific ST segment changes.  Echocardiogram 06/13/2015: Study Conclusions  - Left ventricle: The cavity size was normal. Systolic function was  moderately reduced. The estimated ejection fraction was in the  range of 35% to 40%. - Pulmonary arteries: PA peak pressure: 33 mm Hg (S). - Inferior vena cava: The vessel was dilated. The respirophasic  diameter changes were blunted (< 50%). This is a nonspecific  finding during positive pressure ventilation.  Impressions:  - Technically limited study due to poor echo windows and  tachycardia. Consider TEE if more accurate evaluation is  necessary.   Medications:   Scheduled Medications: . amLODipine  5 mg Oral Daily  . aspirin  81 mg Oral Daily  . atorvastatin  80 mg Oral q1800  . docusate sodium  100 mg  Oral BID  . ferrous sulfate  325 mg Oral TID WC  . metoprolol succinate  100 mg Oral Daily  . metronidazole  500 mg Intravenous Q8H  . pantoprazole  40 mg Oral Daily  . pneumococcal 23 valent vaccine  0.5 mL Intramuscular Tomorrow-1000  . potassium chloride  10 mEq Intravenous Q1 Hr x 4  . sodium bicarbonate  650 mg Oral TID    Infusions: . dextrose 5 % and 0.45% NaCl 125 mL/hr at 07/14/15 0421    PRN Medications: acetaminophen **OR** acetaminophen, bisacodyl, guaiFENesin-dextromethorphan, iohexol, metoprolol, oxyCODONE-acetaminophen, phenol, promethazine   Assessment:   1. Recent episode of chest pain in the setting of nausea and emesis. Angina certainly possible given substrate. Cardiac markers are  equivocal and not clearly consistent with ACS.  Continue conservative therapy for now. He has significant CKD and I would consider cath only if he is having significant symptoms of angina   2. CAD based on prior assessment in 2008 at [facility] at which time he had an occluded RCA and underwent DES to the obtuse marginal. He is currently on aspirin, Lopressor, and Lipitor. No recent ischemic testing. He has evidence of ischemic cardiomyopathy by recent echocardiogram.  3. Chronic atrial fibrillation, typically on Coumadin however this is held at the present time pending further evaluation of other active issues. INR still therapeutic at 2.8.  4. Status post repair of juxtarenal abdominal aortic aneurysm with left renal artery bypass in February of this year.  5. Renal failure requiring temporary hemodialysis. Present creatinine 4.7.  6.  hypolkalemia - he is getting IV D5 1/2NS   He should have some potassium added to his IVF.  Will have his primary team address this  His PO intake is poor     Nahser, Wonda Cheng, MD  07/14/2015 9:22 AM    [place],  [place], [place]  [address] Pager [phone] Phone: [phone]; Fax: [phone]   [facility]  [address] [place], [place]  [address] [phone]   Fax [phone]

## 2015-07-15 DIAGNOSIS — I5022 Chronic systolic (congestive) heart failure: Secondary | ICD-10-CM

## 2015-07-15 LAB — BASIC METABOLIC PANEL
Anion gap: 10 (ref 5–15)
BUN: 29 mg/dL — ABNORMAL HIGH (ref 6–20)
CO2: 19 mmol/L — ABNORMAL LOW (ref 22–32)
Calcium: 8.3 mg/dL — ABNORMAL LOW (ref 8.9–10.3)
Chloride: 108 mmol/L (ref 101–111)
Creatinine, Ser: 4 mg/dL — ABNORMAL HIGH (ref 0.61–1.24)
GFR calc Af Amer: 17 mL/min — ABNORMAL LOW (ref >60)
GFR calc non Af Amer: 15 mL/min — ABNORMAL LOW (ref >60)
Glucose, Bld: 97 mg/dL (ref 65–99)
Potassium: 2.6 mmol/L — CL (ref 3.5–5.1)
Sodium: 137 mmol/L (ref 135–145)

## 2015-07-15 LAB — PROTIME-INR
INR: 3.39 — ABNORMAL HIGH (ref 0.00–1.49)
Prothrombin Time: 33.6 seconds — ABNORMAL HIGH (ref 11.6–15.2)

## 2015-07-15 MED ORDER — POTASSIUM CHLORIDE 10 MEQ/100ML IV SOLN
10.0000 meq | INTRAVENOUS | Status: AC
  Administered 2015-07-15 (×4): 10 meq via INTRAVENOUS
  Filled 2015-07-15 (×4): qty 100

## 2015-07-15 NOTE — Progress Notes (Signed)
CRITICAL VALUE ALERT  Critical value received: potassium 2.6  Date of notification:  07/15/2015  Time of notification:  0321  Critical value read back: yes  Nurse who received alert:  Gerald Stabs Cattaleya Wien rn   MD notified (1st page): on call md  Time of first page:  0330  MD notified (2nd page):  Time of second page:  Responding MD:  T.Early  Time MD responded:  [phone]

## 2015-07-15 NOTE — Progress Notes (Addendum)
Primary cardiologist: Dr. Kate Sable  Seen for followup: CAD, CP  Subjective:    States that he feels better this morning. Had a feeling of chest pressure last evening also associated with nausea and emesis which has been his main complaint in the last few days.  No further chest pain  Has significant CAD and PVD. Still smokes.  Advised him to stop   Objective:   Temp:  [97.4 F (36.3 C)-98.4 F (36.9 C)] 98.4 F (36.9 C) (03/14 0600) Pulse Rate:  [85-92] 89 (03/14 0600) Resp:  [18] 18 (03/13 2100) BP: (149-155)/(81-93) 155/82 mmHg (03/14 1002) SpO2:  [97 %-100 %] 99 % (03/14 0600) Last BM Date: 07/14/15  Filed Weights   07/09/15 1003 07/12/15 0840  Weight: 176 lb 14.4 oz (80.241 kg) 174 lb (78.926 kg)    Intake/Output Summary (Last 24 hours) at 07/15/15 1052 Last data filed at 07/15/15 0900  Gross per 24 hour  Intake 1737.92 ml  Output   1251 ml  Net 486.92 ml    Telemetry: Atrial fibrillation with occasional PVCs versus aberrantly conducted complexes.  Exam:  General: Chronically ill-appearing male in no distress.  HEENT: Poor dentition.  Lungs: Coarse breath sounds, decreased.  Cardiac: Irregularly irregular, no gallop.  Abdomen: NABS, mildly tender, no rebound.  Extremities: No pitting edema.  Lab Results:  Basic Metabolic Panel:  Recent Labs Lab 07/13/15 0417 07/14/15 0321 07/15/15 0321  NA 136 136 137  K 3.1* 2.6* 2.6*  CL 105 106 108  CO2 16* 17* 19*  GLUCOSE 114* 115* 97  BUN 36* 34* 29*  CREATININE 4.79* 4.48* 4.00*  CALCIUM 8.4* 8.5* 8.3*  MG 2.2  --   --     Liver Function Tests:  Recent Labs Lab 07/09/15 1007 07/13/15 1025  AST 11*  10* 11*  ALT 16*  16* 10*  ALKPHOS 84  85 82  BILITOT 0.6  0.5 0.7  PROT 7.7  7.8 7.2  ALBUMIN 2.3*  2.3* 2.1*    CBC:  Recent Labs Lab 07/09/15 1007 07/10/15 0845 07/12/15 0336 07/14/15 0730  WBC 7.6 8.2 12.8*  --   HGB 9.4* 8.8* 8.7* 8.9*  HCT 28.9* 26.6*  27.3* 27.9*  MCV 86.8 86.1 86.1  --   PLT 303 251 272  --     Cardiac Enzymes:  Recent Labs Lab 07/12/15 2223 07/13/15 0417 07/13/15 1025  TROPONINI 0.04* 0.05* 0.04*    Coagulation:  Recent Labs Lab 07/13/15 0417 07/14/15 0321 07/15/15 0321  INR 2.81* 3.27* 3.39*    ECG: I personally reviewed the tracing from 07/12/2015 which showed atrial fibrillation with IVCD, old inferior infarct pattern, and nonspecific ST segment changes.  Echocardiogram 06/13/2015: Study Conclusions  - Left ventricle: The cavity size was normal. Systolic function was  moderately reduced. The estimated ejection fraction was in the  range of 35% to 40%. - Pulmonary arteries: PA peak pressure: 33 mm Hg (S). - Inferior vena cava: The vessel was dilated. The respirophasic  diameter changes were blunted (< 50%). This is a nonspecific  finding during positive pressure ventilation.  Impressions:  - Technically limited study due to poor echo windows and  tachycardia. Consider TEE if more accurate evaluation is  necessary.   Medications:   Scheduled Medications: . amLODipine  5 mg Oral Daily  . aspirin  81 mg Oral Daily  . atorvastatin  80 mg Oral q1800  . docusate sodium  100 mg Oral BID  . ferrous sulfate  325 mg Oral TID WC  . metoprolol succinate  100 mg Oral Daily  . metronidazole  500 mg Intravenous Q8H  . pantoprazole  40 mg Oral Daily  . pneumococcal 23 valent vaccine  0.5 mL Intramuscular Tomorrow-1000  . potassium chloride  10 mEq Intravenous Q1 Hr x 4  . sodium bicarbonate  650 mg Oral TID    Infusions: . dextrose 5 % and 0.45% NaCl 125 mL/hr at 07/15/15 0600    PRN Medications: acetaminophen **OR** acetaminophen, bisacodyl, guaiFENesin-dextromethorphan, iohexol, metoprolol, oxyCODONE-acetaminophen, phenol, promethazine   Assessment:   1. Recent episode of chest pain in the setting of nausea and emesis. Angina certainly possible given substrate. Cardiac markers are  equivocal and not clearly consistent with ACS.  Continue conservative therapy for now. He has significant CKD and I would consider cath only if he is having significant symptoms of angina   2. CAD based on prior assessment in 2008 at [facility] at which time he had an occluded RCA and underwent DES to the obtuse marginal. He is currently on aspirin, Lopressor, and Lipitor. No recent ischemic testing. He has evidence of ischemic cardiomyopathy by recent echocardiogram.  3. Chronic atrial fibrillation, typically on Coumadin however this is held at the present time pending further evaluation of other active issues. INR still therapeutic at 2.8.  4. Status post repair of juxtarenal abdominal aortic aneurysm with left renal artery bypass in February of this year.  5. Renal failure requiring temporary hemodialysis. Present creatinine 4.7.  6.  hypolkalemia - he is getting IV D5 1/2NS    and runs of KCl  Further managemet per primary team  His PO intake is poor   He appears to be stable at present  Nahser, Wonda Cheng, MD  07/15/2015 10:52 AM    [place],  [place], [place]  [address] Pager [phone] Phone: [phone]; Fax: [phone]

## 2015-07-15 NOTE — Progress Notes (Addendum)
  [facility] Progress Note  Subjective  -  Having some nausea and vomiting. Denies abd pain, CP or SOB.   Objective Filed Vitals:   07/14/15 2100 07/15/15 0600  BP: 149/81 150/88  Pulse: 85 89  Temp: 97.6 F (36.4 C) 98.4 F (36.9 C)  Resp: 18     Intake/Output Summary (Last 24 hours) at 07/15/15 0849 Last data filed at 07/15/15 [id number]  Gross per 24 hour  Intake 1737.92 ml  Output   1001 ml  Net 736.92 ml     Assessment/Planning: 65 y.o. male is s/p: repair of juxtarenal abdominal aortic aneurysm, left renal artery bypass  N/V: still present but improved. Continue clears. Continue IVF.  Renal failure: Creatinine continuing to trend down. Still with good UOP.  Hypokalemia: Replete K.  CP: no further episodes. Appreciate cardiology recommendations.  Afib: rate controlled. Coumadin held. INR still supratherapeutic.  Will need dialysis cath out once INR ~1.5   Alvia [place] 07/15/2015 8:49 AM --  Laboratory CBC    Component Value Date/Time   WBC 12.8* 07/12/2015 0336   HGB 8.9* 07/14/2015 0730   HCT 27.9* 07/14/2015 0730   PLT 272 07/12/2015 0336    BMET    Component Value Date/Time   NA 137 07/15/2015 0321   K 2.6* 07/15/2015 0321   CL 108 07/15/2015 0321   CO2 19* 07/15/2015 0321   GLUCOSE 97 07/15/2015 0321   BUN 29* 07/15/2015 0321   CREATININE 4.00* 07/15/2015 0321   CREATININE 1.10 11/07/2014 0804   CALCIUM 8.3* 07/15/2015 0321   GFRNONAA 15* 07/15/2015 0321   GFRNONAA 53* 05/14/2013 0905   GFRAA 17* 07/15/2015 0321   GFRAA 62 05/14/2013 0905    COAG Lab Results  Component Value Date   INR 3.39* 07/15/2015   INR 3.27* 07/14/2015   INR 2.81* 07/13/2015   No results found for: PTT  Antibiotics Anti-infectives    Start     Dose/Rate Route Frequency Ordered Stop   07/13/15 0930  metroNIDAZOLE (FLAGYL) IVPB 500 mg     500 mg 100 mL/hr over 60 Minutes Intravenous Every 8 hours 07/13/15 0924         Virgina Jock,  PA-C [facility] Office: [phone] Pager: [phone] 07/15/2015 8:49 AM     I have examined the patient, reviewed and agree with above. Still with some nausea and vomiting. Still with some diarrhea. Abdomen totally benign with no tenderness or pain.  Curt Jews, MD 07/15/2015 9:01 AM

## 2015-07-16 DIAGNOSIS — I2510 Atherosclerotic heart disease of native coronary artery without angina pectoris: Secondary | ICD-10-CM

## 2015-07-16 LAB — BASIC METABOLIC PANEL
Anion gap: 10 (ref 5–15)
BUN: 25 mg/dL — ABNORMAL HIGH (ref 6–20)
CO2: 18 mmol/L — ABNORMAL LOW (ref 22–32)
Calcium: 8.1 mg/dL — ABNORMAL LOW (ref 8.9–10.3)
Chloride: 109 mmol/L (ref 101–111)
Creatinine, Ser: 3.54 mg/dL — ABNORMAL HIGH (ref 0.61–1.24)
GFR calc Af Amer: 20 mL/min — ABNORMAL LOW (ref >60)
GFR calc non Af Amer: 17 mL/min — ABNORMAL LOW (ref >60)
Glucose, Bld: 87 mg/dL (ref 65–99)
Potassium: 2.8 mmol/L — ABNORMAL LOW (ref 3.5–5.1)
Sodium: 137 mmol/L (ref 135–145)

## 2015-07-16 LAB — PROTIME-INR
INR: 3.27 — ABNORMAL HIGH (ref 0.00–1.49)
Prothrombin Time: 32.7 seconds — ABNORMAL HIGH (ref 11.6–15.2)

## 2015-07-16 MED ORDER — KCL IN DEXTROSE-NACL 30-5-0.45 MEQ/L-%-% IV SOLN
INTRAVENOUS | Status: DC
  Administered 2015-07-16 – 2015-07-21 (×13): via INTRAVENOUS
  Filled 2015-07-16 (×18): qty 1000

## 2015-07-16 NOTE — Progress Notes (Signed)
Primary cardiologist: Dr. Kate Sable  Seen for followup: CAD, CP  Subjective:    States that he feels better this morning. Had a feeling of chest pressure last evening also associated with nausea and emesis which has been his main complaint in the last few days.  No further chest pain  Has significant CAD and PVD. Still smokes.  Advised him to stop   Objective:   Temp:  [97.9 F (36.6 C)-98 F (36.7 C)] 98 F (36.7 C) (03/15 0552) Pulse Rate:  [86-92] 89 (03/15 0552) Resp:  [18] 18 (03/15 0552) BP: (148-159)/(78-85) 159/78 mmHg (03/15 0552) SpO2:  [99 %-100 %] 99 % (03/15 0552) Last BM Date: 07/15/15  Filed Weights   07/09/15 1003 07/12/15 0840  Weight: 176 lb 14.4 oz (80.241 kg) 174 lb (78.926 kg)    Intake/Output Summary (Last 24 hours) at 07/16/15 1101 Last data filed at 07/16/15 0900  Gross per 24 hour  Intake    300 ml  Output   2226 ml  Net  -1926 ml    Telemetry: Atrial fibrillation with occasional PVCs versus aberrantly conducted complexes.  Exam:  General: Chronically ill-appearing male in no distress.  HEENT: Poor dentition.  Lungs: Coarse breath sounds, decreased.  Cardiac: Irregularly irregular, no gallop.  Abdomen: NABS, mildly tender, no rebound.  Extremities: No pitting edema.  Lab Results:  Basic Metabolic Panel:  Recent Labs Lab 07/13/15 0417 07/14/15 0321 07/15/15 0321 07/16/15 0245  NA 136 136 137 137  K 3.1* 2.6* 2.6* 2.8*  CL 105 106 108 109  CO2 16* 17* 19* 18*  GLUCOSE 114* 115* 97 87  BUN 36* 34* 29* 25*  CREATININE 4.79* 4.48* 4.00* 3.54*  CALCIUM 8.4* 8.5* 8.3* 8.1*  MG 2.2  --   --   --     Liver Function Tests:  Recent Labs Lab 07/13/15 1025  AST 11*  ALT 10*  ALKPHOS 82  BILITOT 0.7  PROT 7.2  ALBUMIN 2.1*    CBC:  Recent Labs Lab 07/10/15 0845 07/12/15 0336 07/14/15 0730  WBC 8.2 12.8*  --   HGB 8.8* 8.7* 8.9*  HCT 26.6* 27.3* 27.9*  MCV 86.1 86.1  --   PLT 251 272  --      Cardiac Enzymes:  Recent Labs Lab 07/12/15 2223 07/13/15 0417 07/13/15 1025  TROPONINI 0.04* 0.05* 0.04*    Coagulation:  Recent Labs Lab 07/14/15 0321 07/15/15 0321 07/16/15 0754  INR 3.27* 3.39* 3.27*    ECG: I personally reviewed the tracing from 07/12/2015 which showed atrial fibrillation with IVCD, old inferior infarct pattern, and nonspecific ST segment changes.  Echocardiogram 06/13/2015: Study Conclusions  - Left ventricle: The cavity size was normal. Systolic function was  moderately reduced. The estimated ejection fraction was in the  range of 35% to 40%. - Pulmonary arteries: PA peak pressure: 33 mm Hg (S). - Inferior vena cava: The vessel was dilated. The respirophasic  diameter changes were blunted (< 50%). This is a nonspecific  finding during positive pressure ventilation.  Impressions:  - Technically limited study due to poor echo windows and  tachycardia. Consider TEE if more accurate evaluation is  necessary.   Medications:   Scheduled Medications: . amLODipine  5 mg Oral Daily  . aspirin  81 mg Oral Daily  . atorvastatin  80 mg Oral q1800  . docusate sodium  100 mg Oral BID  . ferrous sulfate  325 mg Oral TID WC  . metoprolol succinate  100 mg Oral Daily  . metronidazole  500 mg Intravenous Q8H  . pantoprazole  40 mg Oral Daily  . pneumococcal 23 valent vaccine  0.5 mL Intramuscular Tomorrow-1000  . sodium bicarbonate  650 mg Oral TID    Infusions: . dexrose 5 % and 0.45 % NaCl with KCl 30 mEq/L      PRN Medications: acetaminophen **OR** acetaminophen, bisacodyl, guaiFENesin-dextromethorphan, iohexol, metoprolol, oxyCODONE-acetaminophen, phenol, promethazine   Assessment:   1. Recent episode of chest pain in the setting of nausea and emesis. Angina certainly possible given substrate. Cardiac markers are equivocal and not clearly consistent with ACS.  Continue conservative therapy for now. He has significant CKD and I  would consider cath only if he is having significant symptoms of angina   2. CAD based on prior assessment in 2008 at [facility] at which time he had an occluded RCA and underwent DES to the obtuse marginal. He is currently on aspirin, Lopressor, and Lipitor. No recent ischemic testing. He has evidence of ischemic cardiomyopathy by recent echocardiogram.  3. Chronic atrial fibrillation, typically on Coumadin however this is held at the present time pending further evaluation of other active issues. INR still therapeutic at 2.8.  4. Status post repair of juxtarenal abdominal aortic aneurysm with left renal artery bypass in February of this year.  5. Renal failure requiring temporary hemodialysis. Present creatinine 4.7.  6.  hypolkalemia - he is getting runs of KCl  Further managemet per primary team  Advancing diet today    He appears to be stable at present from a cardiac standpont We will sign off. Call for question s   Thayer Headings, MD  07/16/2015 11:01 AM    [facility],  [address], [place]  [address] Pager [phone] Phone: [phone]; Fax: [phone]

## 2015-07-16 NOTE — Progress Notes (Addendum)
  [facility] Progress Note  Subjective    Still having some nausea but is overall better. No abdominal pain. Wants some ice cream. Still having diarrhea.   Denies any CP.   Objective Filed Vitals:   07/15/15 2155 07/16/15 0552  BP: 148/85 159/78  Pulse: 92 89  Temp: 98 F (36.7 C) 98 F (36.7 C)  Resp: 18 18    Intake/Output Summary (Last 24 hours) at 07/16/15 0855 Last data filed at 07/16/15 0146  Gross per 24 hour  Intake    300 ml  Output   2076 ml  Net  -1776 ml   Abdomen soft and nontender.   Assessment/Planning: 65 y.o. male is s/p: repair of juxtarenal abdominal aortic aneurysm, left renal artery bypass  Still with nausea, but slowly improving. Abdomen soft and nontender. Will try full liquids.  Continue to replete K.  Flagyl for c.diff Needs to mobilize. No further CP. Appreciate cardiology following.  INR still supratherapeutic.     [name] [name] 07/16/2015 8:55 AM --  Laboratory CBC    Component Value Date/Time   WBC 12.8* 07/12/2015 0336   HGB 8.9* 07/14/2015 0730   HCT 27.9* 07/14/2015 0730   PLT 272 07/12/2015 0336    BMET    Component Value Date/Time   NA 137 07/16/2015 0245   K 2.8* 07/16/2015 0245   CL 109 07/16/2015 0245   CO2 18* 07/16/2015 0245   GLUCOSE 87 07/16/2015 0245   BUN 25* 07/16/2015 0245   CREATININE 3.54* 07/16/2015 0245   CREATININE 1.10 11/07/2014 0804   CALCIUM 8.1* 07/16/2015 0245   GFRNONAA 17* 07/16/2015 0245   GFRNONAA 53* 05/14/2013 0905   GFRAA 20* 07/16/2015 0245   GFRAA 62 05/14/2013 0905    COAG Lab Results  Component Value Date   INR 3.27* 07/16/2015   INR 3.39* 07/15/2015   INR 3.27* 07/14/2015   No results found for: PTT  Antibiotics Anti-infectives    Start     Dose/Rate Route Frequency Ordered Stop   07/13/15 0930  metroNIDAZOLE (FLAGYL) IVPB 500 mg     500 mg 100 mL/hr over 60 Minutes Intravenous Every 8 hours 07/13/15 0924         Virgina Jock,  PA-C [facility] Office: [phone] Pager: [phone] 07/16/2015 8:55 AM     I have examined the patient, reviewed and agree with above.  Curt Jews, MD 07/16/2015 3:58 PM

## 2015-07-17 LAB — BASIC METABOLIC PANEL
Anion gap: 10 (ref 5–15)
BUN: 21 mg/dL — ABNORMAL HIGH (ref 6–20)
CO2: 18 mmol/L — ABNORMAL LOW (ref 22–32)
Calcium: 7.9 mg/dL — ABNORMAL LOW (ref 8.9–10.3)
Chloride: 109 mmol/L (ref 101–111)
Creatinine, Ser: 3.4 mg/dL — ABNORMAL HIGH (ref 0.61–1.24)
GFR calc Af Amer: 21 mL/min — ABNORMAL LOW (ref >60)
GFR calc non Af Amer: 18 mL/min — ABNORMAL LOW (ref >60)
Glucose, Bld: 106 mg/dL — ABNORMAL HIGH (ref 65–99)
Potassium: 2.8 mmol/L — ABNORMAL LOW (ref 3.5–5.1)
Sodium: 137 mmol/L (ref 135–145)

## 2015-07-17 LAB — PROTIME-INR
INR: 3.11 — ABNORMAL HIGH (ref 0.00–1.49)
Prothrombin Time: 31.4 seconds — ABNORMAL HIGH (ref 11.6–15.2)

## 2015-07-17 MED ORDER — POTASSIUM CHLORIDE 10 MEQ/100ML IV SOLN
10.0000 meq | INTRAVENOUS | Status: AC
  Administered 2015-07-17 (×2): 10 meq via INTRAVENOUS
  Filled 2015-07-17 (×3): qty 100

## 2015-07-17 MED ORDER — PANTOPRAZOLE SODIUM 40 MG IV SOLR
40.0000 mg | Freq: Two times a day (BID) | INTRAVENOUS | Status: DC
  Administered 2015-07-17 – 2015-07-19 (×6): 40 mg via INTRAVENOUS
  Filled 2015-07-17 (×6): qty 40

## 2015-07-17 MED ORDER — METOCLOPRAMIDE HCL 5 MG/ML IJ SOLN
5.0000 mg | Freq: Four times a day (QID) | INTRAMUSCULAR | Status: DC
  Administered 2015-07-17 – 2015-07-21 (×18): 5 mg via INTRAVENOUS
  Filled 2015-07-17 (×18): qty 2

## 2015-07-17 NOTE — Progress Notes (Signed)
Pt requested pain medication; however, when I brought the pain medication for the pt, he began to get very nauseous and started dry heaving. Pain med was not given. Will continue to monitor.  Grant Fontana RN, BSN

## 2015-07-17 NOTE — Progress Notes (Addendum)
[facility] [place]  Subjective  -  Still nauseous and vomiting.  No diarrhea.   Objective 165/89 91 97.4 F (36.3 C) (Oral) 18 98%  Intake/Output Summary (Last 24 hours) at 07/17/15 [id number] Last data filed at 07/17/15 0343  Gross per 24 hour  Intake    480 ml  Output   2750 ml  Net  -2270 ml    Abdomin soft without distention Feet warm and well perfused   Assessment/Planning: 65 y.o. male is s/p: repair of juxtarenal abdominal aortic aneurysm, left renal artery bypass  Hypokalemia continue to replete K+  N/V since 07/09/15 bouts of D.  Phenergan, Zofran no aide.  He has developed C-Diff which is being treated with Flagyl.  INR  Has been elevated 3.11 today no coumadin has been dosed since admission.  Will consult GI for assistance Phenergan PRN   Laurence Slate [facility] 07/17/2015 8:12 AM --  Laboratory Lab Results: No results for input(s): WBC, HGB, HCT, PLT in the last 72 hours. BMET  Recent Labs  07/16/15 0245 07/17/15 0420  NA 137 137  K 2.8* 2.8*  CL 109 109  CO2 18* 18*  GLUCOSE 87 106*  BUN 25* 21*  CREATININE 3.54* 3.40*  CALCIUM 8.1* 7.9*    COAG Lab Results  Component Value Date   INR 3.11* 07/17/2015   INR 3.27* 07/16/2015   INR 3.39* 07/15/2015   No results found for: PTT    I have examined the patient, reviewed and agree with above.Very little progress overall. No diarrhea for 2 days but continues to have nausea and vomiting. Abdomen remained soft and nontender. Will consult GI for further suggestions  Curt Jews, MD 07/17/2015 8:28 AM

## 2015-07-17 NOTE — Consult Note (Signed)
   [facility] CM Inpatient Consult   07/17/2015  [name] [name] 1951-03-19 [id number] Patient was screened and evaluated for [facility] Management services for frequent admissions and with re-admission within the past 30 days.  Came by to see the patient and he was sound asleep after attempts to gently awake him.  Made inpatient RNCM aware of regards eligibility for [facility] Care Management services.  Request a consult again if patient will need post hospital monitoring through [facility] [facility]. For questions, please contact: Natividad Brood, RN BSN [facility] Liaison  [phone] business mobile phone Toll free office [phone]

## 2015-07-17 NOTE — Progress Notes (Signed)
Utilization review completed.  

## 2015-07-17 NOTE — Consult Note (Addendum)
[facility] Consult Note  Referring Provider: No ref. provider found Primary Care Physician:  Vic Blackbird, MD Primary Gastroenterologist:  Dr.  Laurel Dimmer Complaint: Nausea and vomiting HPI: [name] [name] is an 65 y.o. white male  status post AAA repair and left renal bypass readmitted on March 8 with nausea and vomiting. This has persisted throughout his admission. The patient had an EGD on January 17 for dysphagia with a hiatal hernia and otherwise normal esophagus and stomach with an empiric Maloney dilator passed with persistent resolution of some pre-existing dysphagia which has not returned. He denies any chest pain except related to dry heaving, denies abdominal pain, early satiety, dysphagia, odynophagia. He states the nausea and vomiting seems to be independent of eating and is relieved temporarily by antinausea medicines.  Past Medical History  Diagnosis Date  . Arteriosclerotic cardiovascular disease (ASCVD)     AMI in 2000 treated at [facility]; cath in 12/2006->  Chronic total obstruction of the RCA;  drug-eluting stent placed in M1; inferior hypokinesis with an EF of 45%  . Tobacco abuse, in remission     20 pack years; quit in 2009  . Hypertension   . Chronic anticoagulation   . Permanent atrial fibrillation (HCC)     on coumadin for couple of years, stopped plavix at that time  . PVD (peripheral vascular disease) ([place])     Ct angiogram in 2009 revealed stable disease with 80% celiac stenosis,50% right renal artery ,ASCVD with ulceration in the abdominal aortashe  . Nephrolithiasis   . Cerebrovascular disease 2002    carotid stent  . Hyperlipidemia   . Myocardial infarction ([place]) 10 yrs ago  . Testicular carcinoma ([place]) 1990    right orchiectomy  . AAA (abdominal aortic aneurysm) ([place]) 2017    3.3cm  . GERD (gastroesophageal reflux disease)   . Chronic combined systolic and diastolic CHF (congestive heart failure) [facility])     Past Surgical History  Procedure  Laterality Date  . Appendectomy  2004  . Testicular cancer  1990    right orchiectomy  . Colonoscopy  06/17/2011    INCOMPLETE, PREP POOR. Procedure: COLONOSCOPY;  Surgeon: Daneil Dolin, MD;  [facility];  Service: Endoscopy;  Laterality: N/A;  10:00  . Esophagogastroduodenoscopy  06/17/2011    severe erosive/ulcerative reflux esophagitis, soft noncritical stricture dilatied, small hh, antral erosion   . Colonoscopy  07/15/2011    [id number] rectal and colon polyps  . Colonoscopy N/A 05/22/2015    Procedure: COLONOSCOPY;  Surgeon: Daneil Dolin, MD;  [facility];  Service: Endoscopy;  Laterality: N/A;  1300 - moved to 2:30 - office to notify  . Esophagogastroduodenoscopy N/A 05/22/2015    Procedure: ESOPHAGOGASTRODUODENOSCOPY (EGD);  Surgeon: Daneil Dolin, MD;  [facility];  Service: Endoscopy;  Laterality: N/A;  . Esophageal dilation N/A 05/22/2015    Procedure: ESOPHAGEAL DILATION;  Surgeon: Daneil Dolin, MD;  [facility];  Service: Endoscopy;  Laterality: N/A;  . Peripheral vascular catheterization N/A 05/28/2015    Procedure: Abdominal Aortogram;  Surgeon: Serafina Mitchell, MD;  Location: [place] CV LAB;  Service: Cardiovascular;  Laterality: N/A;  . Abdominal aortic aneurysm repair N/A 06/09/2015    Procedure: ANEURYSM ABDOMINAL AORTIC REPAIR;  Surgeon: Elam Dutch, MD;  Location: [place];  Service: Vascular;  Laterality: N/A;  . Aortic/renal bypass Left 06/09/2015    Procedure: LEFT RENAL Artery BYPASS;  Surgeon: Elam Dutch, MD;  [facility]  OR;  Service: Vascular;  Laterality: Left;  . Aortic endarterecetomy N/A 06/09/2015    Procedure: AORTIC ENDARTERECETOMY;  Surgeon: Elam Dutch, MD;  Location: [facility];  Service: Vascular;  Laterality: N/A;  . Insertion of dialysis catheter Left 06/26/2015    Procedure: INSERTION OF DIALYSIS CATHETER;  Surgeon: Angelia Mould, MD;  Location: [place];  Service: Vascular;  Laterality:  Left;    Medications Prior to Admission  Medication Sig Dispense Refill  . albuterol (PROVENTIL HFA;VENTOLIN HFA) 108 (90 BASE) MCG/ACT inhaler Inhale 2-4 puffs into the lungs every 4 (four) hours as needed for wheezing or shortness of breath. 1 Inhaler 0  . amLODipine (NORVASC) 10 MG tablet take 1 tablet by mouth once daily 90 tablet 3  . atorvastatin (LIPITOR) 20 MG tablet Take 1 tablet (20 mg total) by mouth at bedtime. 30 tablet 3  . diphenhydrAMINE (BENADRYL) 25 MG tablet Take 25 mg by mouth daily as needed for allergies.    . fexofenadine (ALLEGRA) 180 MG tablet Take 1 tablet (180 mg total) by mouth daily. 30 tablet 2  . labetalol (NORMODYNE) 200 MG tablet Take 1 tablet (200 mg total) by mouth 2 (two) times daily. 60 tablet 2  . Multiple Vitamin (MULITIVITAMIN WITH MINERALS) TABS Take 1 tablet by mouth daily.    . Multiple Vitamin (ONE-A-DAY MENS PO) Take 1 tablet by mouth daily.    . nitroGLYCERIN (NITROSTAT) 0.4 MG SL tablet place 1 tablet under the tongue if needed every 5 minutes for chest pain for 3 doses IF NO RELIEF AFTER 3RD DOSE CALL PRESCRIBER OR 911. 25 tablet 2  . Nutritional Supplements (FEEDING SUPPLEMENT, NEPRO CARB STEADY,) LIQD Take 237 mLs by mouth 2 (two) times daily between meals.  0  . Omega-3 Fatty Acids (FISH OIL) 1000 MG CAPS Take 1,000 mg by mouth daily.   0  . oxyCODONE-acetaminophen (PERCOCET/ROXICET) 5-325 MG tablet Take 1 tablet by mouth every 6 (six) hours as needed for moderate pain. 30 tablet 0  . pantoprazole (PROTONIX) 40 MG tablet Take 1 tablet (40 mg total) by mouth daily. 90 tablet 1  . sodium bicarbonate 650 MG tablet Take 1 tablet (650 mg total) by mouth 3 (three) times daily. 90 tablet 1  . tamsulosin (FLOMAX) 0.4 MG CAPS capsule Take 1 capsule (0.4 mg total) by mouth daily after supper. 30 capsule 2  . traZODone (DESYREL) 50 MG tablet Take 0.5-1 tablets (25-50 mg total) by mouth at bedtime as needed for sleep. (Patient taking differently: Take 50 mg  by mouth at bedtime as needed for sleep. ) 30 tablet 3  . warfarin (COUMADIN) 5 MG tablet Take 1 1/2 tablets daily (Patient taking differently: Take 5-7.5 mg by mouth daily. Take one tablet on all days except on Wednesdays and Thursdays take one and one-half tablets) 45 tablet 6    Allergies: No Known Allergies  Family History  Problem Relation Age of Onset  . Colon cancer Father 33    deceased  . Prostate cancer Father   . Liver disease Neg Hx   . Anesthesia problems Neg Hx   . Hypotension Neg Hx   . Malignant hyperthermia Neg Hx   . Pseudochol deficiency Neg Hx     Social History:  reports that he quit smoking about 7 weeks ago. His smoking use included Cigarettes. He has a 4 pack-year smoking history. He has never used smokeless tobacco. He reports that he does not drink alcohol or use illicit drugs.  Review  of Systems: negative except as above   Blood pressure 165/89, pulse 91, temperature 97.4 F (36.3 C), temperature source Oral, resp. rate 18, height 6' (1.829 m), weight 78.926 kg (174 lb), SpO2 98 %. Head: Normocephalic, without obvious abnormality, atraumatic Neck: no adenopathy, no carotid bruit, no JVD, supple, symmetrical, trachea midline and thyroid not enlarged, symmetric, no tenderness/mass/nodules Resp: clear to auscultation bilaterally Cardio: regular rate and rhythm, S1, S2 normal, no murmur, click, rub or gallop GI: Abdomen soft nondistended with normoactive bowel sounds. No hepatosplenomegaly mass or guarding Extremities: extremities normal, atraumatic, no cyanosis or edema  Results for orders placed or performed during the hospital encounter of 07/09/15 (from the past 48 hour(s))  Basic metabolic panel     Status: Abnormal   Collection Time: 07/16/15  2:45 AM  Result Value Ref Range   Sodium 137 135 - 145 mmol/L   Potassium 2.8 (L) 3.5 - 5.1 mmol/L   Chloride 109 101 - 111 mmol/L   CO2 18 (L) 22 - 32 mmol/L   Glucose, Bld 87 65 - 99 mg/dL   BUN 25 (H) 6 -  20 mg/dL   Creatinine, Ser 3.54 (H) 0.61 - 1.24 mg/dL   Calcium 8.1 (L) 8.9 - 10.3 mg/dL   GFR calc non Af Amer 17 (L) >60 mL/min   GFR calc Af Amer 20 (L) >60 mL/min    Comment: (NOTE) The eGFR has been calculated using the CKD EPI equation. This calculation has not been validated in all clinical situations. eGFR's persistently <60 mL/min signify possible Chronic Kidney Disease.    Anion gap 10 5 - 15  Protime-INR     Status: Abnormal   Collection Time: 07/16/15  7:54 AM  Result Value Ref Range   Prothrombin Time 32.7 (H) 11.6 - 15.2 seconds   INR 3.27 (H) 0.00 - 4.58  Basic metabolic panel     Status: Abnormal   Collection Time: 07/17/15  4:20 AM  Result Value Ref Range   Sodium 137 135 - 145 mmol/L   Potassium 2.8 (L) 3.5 - 5.1 mmol/L   Chloride 109 101 - 111 mmol/L   CO2 18 (L) 22 - 32 mmol/L   Glucose, Bld 106 (H) 65 - 99 mg/dL   BUN 21 (H) 6 - 20 mg/dL   Creatinine, Ser 3.40 (H) 0.61 - 1.24 mg/dL   Calcium 7.9 (L) 8.9 - 10.3 mg/dL   GFR calc non Af Amer 18 (L) >60 mL/min   GFR calc Af Amer 21 (L) >60 mL/min    Comment: (NOTE) The eGFR has been calculated using the CKD EPI equation. This calculation has not been validated in all clinical situations. eGFR's persistently <60 mL/min signify possible Chronic Kidney Disease.    Anion gap 10 5 - 15  Protime-INR     Status: Abnormal   Collection Time: 07/17/15  4:20 AM  Result Value Ref Range   Prothrombin Time 31.4 (H) 11.6 - 15.2 seconds   INR 3.11 (H) 0.00 - 1.49   No results found.  Assessment: Persistent nausea and vomiting characterized by dry heaving, not associated with early satiety odynophagia dysphagia chest or abdominal pain or heartburn at present. EGD 2 months ago essentially unrevealing except for hiatal hernia Plan:  Difficult situation. Repeat EGD would probably be of low yield. Symptoms do not strongly suggest gastroparesis. Will try a course of low-dose Reglan and Change proton pump inhibitor to  double dose IV route  Will check H. pylori antibody. Would reviewed medicines  and discontinue any considered unessential and possibly contributing to nausea. Will follow with you Cottrell Gentles C 07/17/2015, 9:12 AM  Pager [phone] If no answer or after 5 PM call [phone]

## 2015-07-18 LAB — BASIC METABOLIC PANEL
Anion gap: 9 (ref 5–15)
BUN: 19 mg/dL (ref 6–20)
CO2: 18 mmol/L — ABNORMAL LOW (ref 22–32)
Calcium: 8.1 mg/dL — ABNORMAL LOW (ref 8.9–10.3)
Chloride: 111 mmol/L (ref 101–111)
Creatinine, Ser: 3.32 mg/dL — ABNORMAL HIGH (ref 0.61–1.24)
GFR calc Af Amer: 21 mL/min — ABNORMAL LOW (ref >60)
GFR calc non Af Amer: 18 mL/min — ABNORMAL LOW (ref >60)
Glucose, Bld: 105 mg/dL — ABNORMAL HIGH (ref 65–99)
Potassium: 3.6 mmol/L (ref 3.5–5.1)
Sodium: 138 mmol/L (ref 135–145)

## 2015-07-18 LAB — CBC
HCT: 28.3 % — ABNORMAL LOW (ref 39.0–52.0)
Hemoglobin: 8.9 g/dL — ABNORMAL LOW (ref 13.0–17.0)
MCH: 26.7 pg (ref 26.0–34.0)
MCHC: 31.4 g/dL (ref 30.0–36.0)
MCV: 85 fL (ref 78.0–100.0)
Platelets: 240 10*3/uL (ref 150–400)
RBC: 3.33 MIL/uL — ABNORMAL LOW (ref 4.22–5.81)
RDW: 15.1 % (ref 11.5–15.5)
WBC: 9.1 10*3/uL (ref 4.0–10.5)

## 2015-07-18 LAB — PROTIME-INR
INR: 3.05 — ABNORMAL HIGH (ref 0.00–1.49)
Prothrombin Time: 31 seconds — ABNORMAL HIGH (ref 11.6–15.2)

## 2015-07-18 NOTE — Progress Notes (Signed)
[facility] Progress Note  Subjective: Patient states nausea is slightly better  Objective: Vital signs in last 24 hours: Temp:  [97.5 F (36.4 C)-98 F (36.7 C)] 98 F (36.7 C) (03/17 0502) Pulse Rate:  [73-91] 83 (03/17 1011) Resp:  [18-20] 20 (03/17 0502) BP: (145-158)/(84-89) 145/89 mmHg (03/17 1011) SpO2:  [98 %-100 %] 98 % (03/17 0502) Weight change:    PE: Unchanged  Lab Results: Results for orders placed or performed during the hospital encounter of 07/09/15 (from the past 24 hour(s))  Protime-INR     Status: Abnormal   Collection Time: 07/18/15  4:20 AM  Result Value Ref Range   Prothrombin Time 31.0 (H) 11.6 - 15.2 seconds   INR 3.05 (H) 0.00 - 1.49  CBC     Status: Abnormal   Collection Time: 07/18/15  4:20 AM  Result Value Ref Range   WBC 9.1 4.0 - 10.5 K/uL   RBC 3.33 (L) 4.22 - 5.81 MIL/uL   Hemoglobin 8.9 (L) 13.0 - 17.0 g/dL   HCT 28.3 (L) 39.0 - 52.0 %   MCV 85.0 78.0 - 100.0 fL   MCH 26.7 26.0 - 34.0 pg   MCHC 31.4 30.0 - 36.0 g/dL   RDW 15.1 11.5 - 15.5 %   Platelets 240 150 - 400 K/uL  Basic metabolic panel     Status: Abnormal   Collection Time: 07/18/15  4:20 AM  Result Value Ref Range   Sodium 138 135 - 145 mmol/L   Potassium 3.6 3.5 - 5.1 mmol/L   Chloride 111 101 - 111 mmol/L   CO2 18 (L) 22 - 32 mmol/L   Glucose, Bld 105 (H) 65 - 99 mg/dL   BUN 19 6 - 20 mg/dL   Creatinine, Ser 3.32 (H) 0.61 - 1.24 mg/dL   Calcium 8.1 (L) 8.9 - 10.3 mg/dL   GFR calc non Af Amer 18 (L) >60 mL/min   GFR calc Af Amer 21 (L) >60 mL/min   Anion gap 9 5 - 15    Studies/Results: No results found.    Assessment: Persistent nausea with some vomiting etiology unclear, recent EGD with dilatation due to dysphagia 2 months ago apparently showing no other pathology than a lower esophageal ring.  Plan: Continue low-dose Reglan  Continue IV proton pump inhibitor until can reliably take by mouth medicines Await H. pylori antibody We'll continue to  follow.    [name] [name] C 07/18/2015, 10:30 AM  Pager [phone] If no answer or after 5 PM call [phone]

## 2015-07-18 NOTE — Progress Notes (Addendum)
[facility] [place]  Subjective  - Patient states he has had a BM and has kept his breakfast of full liquids down this am.  No nausea currently.     Objective 158/86 91 98 F (36.7 C) (Oral) 20 98%  Intake/Output Summary (Last 24 hours) at 07/18/15 1011 Last data filed at 07/17/15 2206  Gross per 24 hour  Intake    100 ml  Output    850 ml  Net   -750 ml    Palpable DP bilaterally Abdomin soft NTTP, Positive BS   Assessment/Planning:  65 y.o. male is s/p: repair of juxtarenal abdominal aortic aneurysm, left renal artery bypass He is feeling better this am.  We will take it slow with his PO intake.  If he tolerates full clears at lunch we may advance him to heart healthy as tolerates.   K+ 3.6 today  INR 3.05, chronic A fib he is therapeutic coumadin has been held and will need to be re started at discharges. Cr 3.54 continues to decrease, UO stable at 2, 000- 3,000 a day.   Plan per GI  Will try a course of low-dose Reglan and Change proton pump inhibitor to double dose IV route  Will check H. pylori antibody.  Laurence Slate [facility] 07/18/2015 10:11 AM --  Laboratory Lab Results:  Recent Labs  07/18/15 0420  WBC 9.1  HGB 8.9*  HCT 28.3*  PLT 240   BMET  Recent Labs  07/17/15 0420 07/18/15 0420  NA 137 138  K 2.8* 3.6  CL 109 111  CO2 18* 18*  GLUCOSE 106* 105*  BUN 21* 19  CREATININE 3.40* 3.32*  CALCIUM 7.9* 8.1*    COAG Lab Results  Component Value Date   INR 3.05* 07/18/2015   INR 3.11* 07/17/2015   INR 3.27* 07/16/2015   No results found for: PTT    I have examined the patient, reviewed and agree with above. Appreciate GI consultation from Dr. Amedeo Plenty. Patient actually feels a little better today. No nausea and vomiting since yesterday. Abdomen continues benign with no tenderness.  Curt Jews, MD 07/18/2015 1:36 PM

## 2015-07-19 LAB — CBC
HCT: 27.6 % — ABNORMAL LOW (ref 39.0–52.0)
Hemoglobin: 8.8 g/dL — ABNORMAL LOW (ref 13.0–17.0)
MCH: 27.3 pg (ref 26.0–34.0)
MCHC: 31.9 g/dL (ref 30.0–36.0)
MCV: 85.7 fL (ref 78.0–100.0)
Platelets: 213 10*3/uL (ref 150–400)
RBC: 3.22 MIL/uL — ABNORMAL LOW (ref 4.22–5.81)
RDW: 15.2 % (ref 11.5–15.5)
WBC: 8 10*3/uL (ref 4.0–10.5)

## 2015-07-19 LAB — BASIC METABOLIC PANEL
Anion gap: 9 (ref 5–15)
BUN: 19 mg/dL (ref 6–20)
CO2: 18 mmol/L — ABNORMAL LOW (ref 22–32)
Calcium: 8 mg/dL — ABNORMAL LOW (ref 8.9–10.3)
Chloride: 111 mmol/L (ref 101–111)
Creatinine, Ser: 3.19 mg/dL — ABNORMAL HIGH (ref 0.61–1.24)
GFR calc Af Amer: 22 mL/min — ABNORMAL LOW (ref >60)
GFR calc non Af Amer: 19 mL/min — ABNORMAL LOW (ref >60)
Glucose, Bld: 102 mg/dL — ABNORMAL HIGH (ref 65–99)
Potassium: 3.6 mmol/L (ref 3.5–5.1)
Sodium: 138 mmol/L (ref 135–145)

## 2015-07-19 LAB — H. PYLORI ANTIBODY, IGG: H Pylori IgG: <0.9 U/mL (ref 0.0–0.8)

## 2015-07-19 LAB — PROTIME-INR
INR: 2.26 — ABNORMAL HIGH (ref 0.00–1.49)
Prothrombin Time: 24.8 seconds — ABNORMAL HIGH (ref 11.6–15.2)

## 2015-07-19 NOTE — Progress Notes (Addendum)
  [facility] Progress Note  Subjective   Feeling better. No nausea.   Objective Filed Vitals:   07/18/15 2154 07/19/15 0424  BP: 160/90 156/87  Pulse: 94 94  Temp: 98.5 F (36.9 C) 97.6 F (36.4 C)  Resp: 18 17    Intake/Output Summary (Last 24 hours) at 07/19/15 0724 Last data filed at 07/19/15 [id number]  Gross per 24 hour  Intake 3049.17 ml  Output   2701 ml  Net 348.17 ml   Alert, appears comfortable in NAD Lungs clear Abdomen soft, non tender, no distension  Assessment/Planning: 65 y.o. male is s/p: repair of juxtarenal abdominal aortic aneurysm, left renal artery bypass  Nausea: tolerated regular diet last night, will change IV protonix to po when consistently tolerating po's, appreciate GI assistance C. Diff: continue flagyl  Renal failure: good UOP, creatinine continuing to trend down, continue IVF Afib: rate controlled. Coumadin held. INR finally trending down. 2.26 today.  Needs to mobilize in halls: discussed with daytime RN.  Plan to remove TDC once INR ~1.6.  Dispo: home once consistently tolerating regular diet. Hopefully next couple days.   [name] [name] 07/19/2015 7:24 AM --  Laboratory CBC    Component Value Date/Time   WBC 8.0 07/19/2015 0502   HGB 8.8* 07/19/2015 0502   HCT 27.6* 07/19/2015 0502   PLT 213 07/19/2015 0502    BMET    Component Value Date/Time   NA 138 07/19/2015 0502   K 3.6 07/19/2015 0502   CL 111 07/19/2015 0502   CO2 18* 07/19/2015 0502   GLUCOSE 102* 07/19/2015 0502   BUN 19 07/19/2015 0502   CREATININE 3.19* 07/19/2015 0502   CREATININE 1.10 11/07/2014 0804   CALCIUM 8.0* 07/19/2015 0502   GFRNONAA 19* 07/19/2015 0502   GFRNONAA 53* 05/14/2013 0905   GFRAA 22* 07/19/2015 0502   GFRAA 62 05/14/2013 0905    COAG Lab Results  Component Value Date   INR 2.26* 07/19/2015   INR 3.05* 07/18/2015   INR 3.11* 07/17/2015   No results found for: PTT  Antibiotics Anti-infectives    Start      Dose/Rate Route Frequency Ordered Stop   07/13/15 0930  metroNIDAZOLE (FLAGYL) IVPB 500 mg     500 mg 100 mL/hr over 60 Minutes Intravenous Every 8 hours 07/13/15 0924       Virgina Jock, PA-C [facility] Office: [phone] Pager: [phone] 07/19/2015 7:24 AM  Agree with above. He appears to have turned the corner. On Flagyl for C. Diff.   [name], MD, [place] [phone]

## 2015-07-19 NOTE — Progress Notes (Signed)
Subjective: No further nausea/vomiting. Tolerating regular diet. No blood in stool.  Objective: Vital signs in last 24 hours: Temp:  [97.6 F (36.4 C)-98.5 F (36.9 C)] 97.6 F (36.4 C) (03/18 0424) Pulse Rate:  [85-94] 94 (03/18 0424) Resp:  [17-18] 17 (03/18 0424) BP: (150-160)/(85-90) 156/87 mmHg (03/18 0424) SpO2:  [99 %-100 %] 100 % (03/18 0424) Weight change:  Last BM Date: 07/18/15  PE: GEN:  Chronically ill-appearing, older-appearing than stated age ABD:  Soft, non-tender, no peritonitis  Lab Results:  CBC    Component Value Date/Time   WBC 8.0 07/19/2015 0502   RBC 3.22* 07/19/2015 0502   HGB 8.8* 07/19/2015 0502   HCT 27.6* 07/19/2015 0502   PLT 213 07/19/2015 0502   MCV 85.7 07/19/2015 0502   MCH 27.3 07/19/2015 0502   MCHC 31.9 07/19/2015 0502   RDW 15.2 07/19/2015 0502   LYMPHSABS 0.9 06/23/2015 0250   MONOABS 1.2* 06/23/2015 0250   EOSABS 0.1 06/23/2015 0250   BASOSABS 0.0 06/23/2015 0250   CMP     Component Value Date/Time   NA 138 07/19/2015 0502   K 3.6 07/19/2015 0502   CL 111 07/19/2015 0502   CO2 18* 07/19/2015 0502   GLUCOSE 102* 07/19/2015 0502   BUN 19 07/19/2015 0502   CREATININE 3.19* 07/19/2015 0502   CREATININE 1.10 11/07/2014 0804   CALCIUM 8.0* 07/19/2015 0502   PROT 7.2 07/13/2015 1025   ALBUMIN 2.1* 07/13/2015 1025   AST 11* 07/13/2015 1025   ALT 10* 07/13/2015 1025   ALKPHOS 82 07/13/2015 1025   BILITOT 0.7 07/13/2015 1025   GFRNONAA 19* 07/19/2015 0502   GFRNONAA 53* 05/14/2013 0905   GFRAA 22* 07/19/2015 0502   GFRAA 62 05/14/2013 0905   Assessment:  1.  Nausea and vomiting after recent AAA repair.  CT last week showed likely post-operative seroma with some mass-effect on the small bowel.  Overall clinical course most compatible with pressure-like effect on small bowel from post-operative seroma seen on CT scan last week, which in turn caused some partial bowel transit troubles ultimately leading to nausea and  vomiting.  Suspect the seroma is decreasing over time, as is expected, and as a result his nausea and vomiting have improved, and, now, resolved. 2.  C. Difficile colitis.  Diarrhea improving on antibiotics.  Plan:  1.  10-day course of metronidazole 500 mg po/iv every 8 hours (TID). 2.  No further GI test evaluation or investigation is needed at the present time. 3.  Wean down metoclopramide, and would stop this medication prior to patient's hospital discharge. 4.  Will sign-off; please call with questions; thank you for the consultation.   Landry Dyke 07/19/2015, 12:59 PM   Pager [phone] If no answer or after 5 PM call [phone]

## 2015-07-20 LAB — BASIC METABOLIC PANEL
Anion gap: 10 (ref 5–15)
BUN: 23 mg/dL — ABNORMAL HIGH (ref 6–20)
CO2: 17 mmol/L — ABNORMAL LOW (ref 22–32)
Calcium: 7.8 mg/dL — ABNORMAL LOW (ref 8.9–10.3)
Chloride: 110 mmol/L (ref 101–111)
Creatinine, Ser: 2.94 mg/dL — ABNORMAL HIGH (ref 0.61–1.24)
GFR calc Af Amer: 25 mL/min — ABNORMAL LOW (ref >60)
GFR calc non Af Amer: 21 mL/min — ABNORMAL LOW (ref >60)
Glucose, Bld: 90 mg/dL (ref 65–99)
Potassium: 3.9 mmol/L (ref 3.5–5.1)
Sodium: 137 mmol/L (ref 135–145)

## 2015-07-20 LAB — PROTIME-INR
INR: 1.79 — ABNORMAL HIGH (ref 0.00–1.49)
Prothrombin Time: 20.7 seconds — ABNORMAL HIGH (ref 11.6–15.2)

## 2015-07-20 LAB — CBC
HCT: 26.9 % — ABNORMAL LOW (ref 39.0–52.0)
Hemoglobin: 8.5 g/dL — ABNORMAL LOW (ref 13.0–17.0)
MCH: 27.1 pg (ref 26.0–34.0)
MCHC: 31.6 g/dL (ref 30.0–36.0)
MCV: 85.7 fL (ref 78.0–100.0)
Platelets: 188 10*3/uL (ref 150–400)
RBC: 3.14 MIL/uL — ABNORMAL LOW (ref 4.22–5.81)
RDW: 15.2 % (ref 11.5–15.5)
WBC: 8.3 10*3/uL (ref 4.0–10.5)

## 2015-07-20 MED ORDER — METOCLOPRAMIDE HCL 5 MG/ML IJ SOLN: 2.5000 mg | Freq: Four times a day (QID) | INTRAMUSCULAR | Status: DC | PRN

## 2015-07-20 MED ORDER — PANTOPRAZOLE SODIUM 40 MG PO TBEC
40.0000 mg | DELAYED_RELEASE_TABLET | Freq: Every day | ORAL | Status: DC
  Administered 2015-07-20 – 2015-07-21 (×2): 40 mg via ORAL
  Filled 2015-07-20 (×2): qty 1

## 2015-07-20 NOTE — Progress Notes (Addendum)
  [facility] Progress Note  VASCULAR SURGERY ASSESSMENT & PLAN:   * INR = 1.79 so should be able to remove [facility] tomorrow.  * I will have Dr. Oneida Alar look at incision. The area of concern may be a suture, but reluctant to probe as currently it does not show signs of infection.  * Continues Flagyl for C. Diff.   * Should be able to go home soon.  [name] Mayo, MD, FACS [facility] [phone] Office: [phone]   Subjective    Feels better today. No nausea. No further diarrhea. Complaining of spot in incision.   Objective Filed Vitals:   07/19/15 1956 07/20/15 0640  BP: 133/73 135/59  Pulse: 92 86  Temp: 98 F (36.7 C) 98.2 F (36.8 C)  Resp: 18 18    Intake/Output Summary (Last 24 hours) at 07/20/15 0745 Last data filed at 07/20/15 0531  Gross per 24 hour  Intake 3064.58 ml  Output   1900 ml  Net 1164.58 ml   Abdomen soft and non tender.  Raised stitch towards mid incision.  Feet warm bilaterally.   Assessment/Planning: 65 y.o. male is s/p: repair of juxtarenal abdominal aortic aneurysm, left renal artery bypass  Nausea: resolved. Tolerating regular diet. Will wean down metoclopramide. Convert IV protonix to po. Appreciate GI assistance.  C.diff: continue flagyl (TID)  x 10 days.  Continue to mobilize in halls.  Afib: rate controlled. Continue to hold coumadin for TDC removal.  Renal failure: Creatinine continuing to trend down 2.94. UOP good.   INR 1.7, plan TDC removal tomorrow.  Dispo: Plan home next couple days.    [name] [name] 07/20/2015 7:45 AM --  Laboratory CBC    Component Value Date/Time   WBC 8.3 07/20/2015 0230   HGB 8.5* 07/20/2015 0230   HCT 26.9* 07/20/2015 0230   PLT 188 07/20/2015 0230    BMET    Component Value Date/Time   NA 137 07/20/2015 0230   K 3.9 07/20/2015 0230   CL 110 07/20/2015 0230   CO2 17* 07/20/2015 0230   GLUCOSE 90 07/20/2015 0230   BUN 23* 07/20/2015 0230   CREATININE 2.94*  07/20/2015 0230   CREATININE 1.10 11/07/2014 0804   CALCIUM 7.8* 07/20/2015 0230   GFRNONAA 21* 07/20/2015 0230   GFRNONAA 53* 05/14/2013 0905   GFRAA 25* 07/20/2015 0230   GFRAA 62 05/14/2013 0905    COAG Lab Results  Component Value Date   INR 1.79* 07/20/2015   INR 2.26* 07/19/2015   INR 3.05* 07/18/2015   No results found for: PTT  Antibiotics Anti-infectives    Start     Dose/Rate Route Frequency Ordered Stop   07/13/15 0930  metroNIDAZOLE (FLAGYL) IVPB 500 mg     500 mg 100 mL/hr over 60 Minutes Intravenous Every 8 hours 07/13/15 0924         [name] Jock, PA-C [facility] Office: [phone] Pager: [phone] 07/20/2015 7:45 AM

## 2015-07-21 LAB — CBC
HCT: 26.8 % — ABNORMAL LOW (ref 39.0–52.0)
Hemoglobin: 8.5 g/dL — ABNORMAL LOW (ref 13.0–17.0)
MCH: 27.2 pg (ref 26.0–34.0)
MCHC: 31.7 g/dL (ref 30.0–36.0)
MCV: 85.6 fL (ref 78.0–100.0)
Platelets: 199 10*3/uL (ref 150–400)
RBC: 3.13 MIL/uL — ABNORMAL LOW (ref 4.22–5.81)
RDW: 15.3 % (ref 11.5–15.5)
WBC: 7.9 10*3/uL (ref 4.0–10.5)

## 2015-07-21 LAB — BASIC METABOLIC PANEL
Anion gap: 12 (ref 5–15)
BUN: 26 mg/dL — ABNORMAL HIGH (ref 6–20)
CO2: 18 mmol/L — ABNORMAL LOW (ref 22–32)
Calcium: 7.8 mg/dL — ABNORMAL LOW (ref 8.9–10.3)
Chloride: 106 mmol/L (ref 101–111)
Creatinine, Ser: 3.15 mg/dL — ABNORMAL HIGH (ref 0.61–1.24)
GFR calc Af Amer: 23 mL/min — ABNORMAL LOW (ref >60)
GFR calc non Af Amer: 20 mL/min — ABNORMAL LOW (ref >60)
Glucose, Bld: 101 mg/dL — ABNORMAL HIGH (ref 65–99)
Potassium: 3.9 mmol/L (ref 3.5–5.1)
Sodium: 136 mmol/L (ref 135–145)

## 2015-07-21 LAB — PROTIME-INR
INR: 1.48 (ref 0.00–1.49)
Prothrombin Time: 18 seconds — ABNORMAL HIGH (ref 11.6–15.2)

## 2015-07-21 MED ORDER — METRONIDAZOLE 500 MG PO TABS: 500.0000 mg | ORAL_TABLET | Freq: Three times a day (TID) | ORAL | Status: DC

## 2015-07-21 MED ORDER — WARFARIN SODIUM 7.5 MG PO TABS
7.5000 mg | ORAL_TABLET | Freq: Once | ORAL | Status: AC
  Administered 2015-07-21: 7.5 mg via ORAL
  Filled 2015-07-21: qty 1

## 2015-07-21 MED ORDER — FERROUS SULFATE 325 (65 FE) MG PO TABS: 325.0000 mg | ORAL_TABLET | Freq: Three times a day (TID) | ORAL | Status: DC

## 2015-07-21 MED ORDER — WARFARIN - PHARMACIST DOSING INPATIENT: Freq: Every day | Status: DC

## 2015-07-21 NOTE — Progress Notes (Signed)
H&P    CC:  Catheter removal   HPI:  This is a 65 y.o. male who is s/p Repair of juxtarenal abdominal aortic aneurysm, left renal artery bypass.  Post operatively, he did have some renal failure, which required some dialysis.  His renal function continues to recover.  He is on coumadin for Afib.  This has been held to have his TDC removed.  His INR today is 1.48.     Past Medical History  Diagnosis Date  . Arteriosclerotic cardiovascular disease (ASCVD)     AMI in 2000 treated at [facility]; cath in 12/2006->  Chronic total obstruction of the RCA;  drug-eluting stent placed in M1; inferior hypokinesis with an EF of 45%  . Tobacco abuse, in remission     20 pack years; quit in 2009  . Hypertension   . Chronic anticoagulation   . Permanent atrial fibrillation (HCC)     on coumadin for couple of years, stopped plavix at that time  . PVD (peripheral vascular disease) ([place])     Ct angiogram in 2009 revealed stable disease with 80% celiac stenosis,50% right renal artery ,ASCVD with ulceration in the abdominal aortashe  . Nephrolithiasis   . Cerebrovascular disease 2002    carotid stent  . Hyperlipidemia   . Myocardial infarction ([place]) 10 yrs ago  . Testicular carcinoma ([place]) 1990    right orchiectomy  . AAA (abdominal aortic aneurysm) ([place]) 2017    3.3cm  . GERD (gastroesophageal reflux disease)   . Chronic combined systolic and diastolic CHF (congestive heart failure) (HCC)     FH:  Non-Contributory  Social History   Social History  . Marital Status: Married    Spouse Name: N/A  . Number of Children: 2  . Years of Education: N/A   Occupational History  . disabled    Social History Main Topics  . Smoking status: Former Smoker -- 0.10 packs/day for 40 years    Types: Cigarettes    Quit date: 05/24/2015  . Smokeless tobacco: Never Used     Comment: Patient smokes up to 5 cigarettes in a week  . Alcohol Use: No  . Drug Use: No  . Sexual Activity: Yes    Birth Control/  Protection: None   Other Topics Concern  . Not on file   Social History Narrative    No Known Allergies  Current Facility-Administered Medications  Medication Dose Route Frequency Provider Last Rate Last Dose  . acetaminophen (TYLENOL) tablet 325-650 mg  325-650 mg Oral Q4H PRN Elam Dutch, MD   650 mg at 07/10/15 2141   Or  . acetaminophen (TYLENOL) suppository 325-650 mg  325-650 mg Rectal Q4H PRN Elam Dutch, MD      . amLODipine (NORVASC) tablet 5 mg  5 mg Oral Daily Satira Sark, MD   5 mg at 07/20/15 0924  . aspirin chewable tablet 81 mg  81 mg Oral Daily Flossie Dibble, MD   81 mg at 07/20/15 [id number]  . atorvastatin (LIPITOR) tablet 80 mg  80 mg Oral q1800 Flossie Dibble, MD   80 mg at 07/20/15 2032  . bisacodyl (DULCOLAX) suppository 10 mg  10 mg Rectal Daily PRN Elam Dutch, MD      . dextrose 5 % and 0.45 % NaCl with KCl 30 mEq/L infusion   Intravenous Continuous Alvia [place], PA-C 125 mL/hr at 07/21/15 0554    . docusate sodium (COLACE) capsule 100 mg  100 mg Oral  BID Elam Dutch, MD   100 mg at 07/11/15 [id number]  . ferrous sulfate tablet 325 mg  325 mg Oral TID WC Elam Dutch, MD   325 mg at 07/20/15 2032  . guaiFENesin-dextromethorphan (ROBITUSSIN DM) 100-10 MG/5ML syrup 15 mL  15 mL Oral Q4H PRN Elam Dutch, MD      . iohexol (OMNIPAQUE) 300 MG/ML solution 25 mL  25 mL Oral Once PRN Elam Dutch, MD      . metoCLOPramide [facility]) injection 2.5 mg  2.5 mg Intravenous Q6H PRN Alvia Grove, PA-C   2.5 mg at 07/20/15 2053  . metoCLOPramide (REGLAN) injection 5 mg  5 mg Intravenous 4 times per day Teena Irani, MD   5 mg at 07/21/15 0550  . metoprolol (LOPRESSOR) injection 5 mg  5 mg Intravenous Q6H PRN Conrad [place]ster, MD   5 mg at 07/13/15 0109  . metoprolol succinate (TOPROL-XL) 24 hr tablet 100 mg  100 mg Oral Daily Satira Sark, MD   100 mg at 07/20/15 [id number]  . metroNIDAZOLE (FLAGYL) IVPB 500 mg  500 mg Intravenous Q8H Ulyses Amor,  PA-C   500 mg at 07/21/15 0259  . oxyCODONE-acetaminophen (PERCOCET/ROXICET) 5-325 MG per tablet 1 tablet  1 tablet Oral Q6H PRN Elam Dutch, MD   1 tablet at 07/21/15 0308  . pantoprazole (PROTONIX) EC tablet 40 mg  40 mg Oral Daily Alvia Grove, PA-C   40 mg at 07/20/15 0924  . phenol (CHLORASEPTIC) mouth spray 1 spray  1 spray Mouth/Throat PRN Elam Dutch, MD      . pneumococcal 23 valent vaccine (PNU-IMMUNE) injection 0.5 mL  0.5 mL Intramuscular Tomorrow-1000 Elam Dutch, MD      . promethazine (PHENERGAN) injection 25 mg  25 mg Intravenous Q6H PRN Conrad [place], MD   25 mg at 07/18/15 1811  . sodium bicarbonate tablet 650 mg  650 mg Oral TID Elam Dutch, MD   650 mg at 07/20/15 2033    ROS:  See HPI  PHYSICAL EXAM  Filed Vitals:   07/20/15 2041 07/21/15 0458  BP: 148/81 147/83  Pulse: 96 99  Temp: 97.9 F (36.6 C) 97.7 F (36.5 C)  Resp: 18 20    Gen:  Well developed well nourished HEENT:  normocephalic Neck:  Left IJ TDC Heart:  irregular Lungs:  Non-labored Abdomen:  Soft; well healed incision. Skin:  No obvious rashes Neuro:  In tact  Lab/X-ray:  Impression: This is a 65 y.o. male here for diatek catheter removal  Plan:  Removal of left diatek catheter  Leontine Locket, PA-C [facility] [phone] 07/21/2015 8:04 AM

## 2015-07-21 NOTE — Care Management Note (Signed)
Case Management Note  Patient Details  Name: [name] [name] MRN: [id number] Date of Birth: May 02, 1951  Subjective/Objective:     Readmission from home, 06/09/2015 Repair of juxtarenal abdominal aortic aneurysm, left renal artery bypass              Action/Plan:  NCM spoke to pt and wife at bedside. Wife states they are agreeable to [facility] with [facility]. Pt declined last admission. Pt states she picked up RW with seat from [employer]. Requesting 3n1 for home. Contacted [facility] for 3n1 for home. Contacted [facility] rep with [place] referral.  Will HH PT/RN orders with F2F at dc.    Expected Discharge Date:     07/21/15             Expected Discharge Plan:  [place]  In-House Referral:  NA  Discharge planning Services  CM Consult  Post Acute Care Choice:  Home Health, Resumption of Svcs/PTA Provider Choice offered to:  Spouse  DME Arranged:  3-N-1 DME Agency:  [place]:  RN, PT [facility] Agency:  [place]  Status of Service:  Completed, signed off  Medicare Important Message Given:  Yes Date Medicare IM Given:    Medicare IM give by:    Date Additional Medicare IM Given:    Additional Medicare Important Message give by:     If discussed at [place] of Stay Meetings, dates discussed:  07/15/15, 07/17/15   Discharge Disposition: home/home health   Additional Comments:  07/21/15- 1700- Marvetta Gibbons RN, BSN - pt for d/c home today- orders to resume [place] have been placed- call made to [place] with [facility] regarding d/c and [place] resumption-   [name], Romeo Rabon, RN 07/21/2015, 5:00 PM

## 2015-07-21 NOTE — Progress Notes (Signed)
  Catheter Removal Procedure Note    Diagnosis: ESRD  Plan:  Remove left diatek catheter  Consent signed:  Yes.   Time out completed:  Yes.   Coumadin:  Yes.   PT/INR (if applicable):  0000000 Other labs:  Procedure: 1.  Sterile prepping and draping over catheter area 2. 0 ml 2% lidocaine plain instilled at removal site. 3.  left catheter removed in its entirety with cuff in tact. 4.  Complications:  none 5. Tip of catheter sent for culture:  No.   Patient tolerated procedure well:  Yes.   Pressure held, no bleeding noted, dressing applied Instructions given to the pt regarding wound care and bleeding.  Other:  Leontine Locket, PA-C 07/21/2015 8:06 AM

## 2015-07-21 NOTE — Progress Notes (Signed)
Pt in stable condition. Went over discharge instructions wth pt,pt verbalized understanding paper prescriptions  given to pt, pt belongings at bedside, iv taken out, cardiac monitor dc, ccmd notified, pt taken off the unit on a wheelchair by a NT.

## 2015-07-21 NOTE — Progress Notes (Signed)
ANTICOAGULATION CONSULT NOTE - Follow Up Consult  Pharmacy Consult for coumadin Indication: atrial fibrillation  No Known Allergies  Patient Measurements: Height: 6' (182.9 cm) (pt verbalized height) Weight: 174 lb (78.926 kg) (pt verbalized weight) IBW/kg (Calculated) : 77.6  Vital Signs: Temp: 97.8 F (36.6 C) (03/20 1010) Temp Source: Oral (03/20 1010) BP: 141/93 mmHg (03/20 1010) Pulse Rate: 100 (03/20 1010)  Labs:  Recent Labs  07/19/15 0502 07/20/15 0230 07/21/15 0248  HGB 8.8* 8.5* 8.5*  HCT 27.6* 26.9* 26.8*  PLT 213 188 199  LABPROT 24.8* 20.7* 18.0*  INR 2.26* 1.79* 1.48  CREATININE 3.19* 2.94* 3.15*    Estimated Creatinine Clearance: 26.7 mL/min (by C-G formula based on Cr of 3.15).   Medications:  Scheduled:  . amLODipine  5 mg Oral Daily  . aspirin  81 mg Oral Daily  . atorvastatin  80 mg Oral q1800  . docusate sodium  100 mg Oral BID  . ferrous sulfate  325 mg Oral TID WC  . metoCLOPramide (REGLAN) injection  5 mg Intravenous 4 times per day  . metoprolol succinate  100 mg Oral Daily  . metronidazole  500 mg Intravenous Q8H  . pantoprazole  40 mg Oral Daily  . pneumococcal 23 valent vaccine  0.5 mL Intramuscular Tomorrow-1000  . sodium bicarbonate  650 mg Oral TID    Assessment: 65 yo male with ESRD on coumadin PTA for afib. He is now s/p HD cath removal on 3/20 and to restart coumadin. He is noted on flagyl D9 and for 10 days total treatment. -INR= 1.58, CHADSVASC= 5  home coumadin dose: 5mg  daily except 7.5mg  on Wed, Thurs  Goal of Therapy:  INR 2-3 Monitor platelets by anticoagulation protocol: Yes   Plan:  -Coumadin 7.5mg  x1 -Daily PT/INR  Hildred Laser, Pharm D 07/21/2015 2:11 PM

## 2015-07-21 NOTE — Progress Notes (Addendum)
  Progress Note    07/21/2015 8:00 AM * No surgery found *  Subjective:  No complaints; nausea resolved.  Afebrile [id number] sytolic HR [id number] Afib [id number] RA  Filed Vitals:   07/20/15 2041 07/21/15 0458  BP: 148/81 147/83  Pulse: 96 99  Temp: 97.9 F (36.6 C) 97.7 F (36.5 C)  Resp: 18 20    Physical Exam: Lungs:  Non labored Incisions:  Clean and dry with pimple like area mid incision Abdomen:  Soft, NT/ND  CBC    Component Value Date/Time   WBC 7.9 07/21/2015 0248   RBC 3.13* 07/21/2015 0248   HGB 8.5* 07/21/2015 0248   HCT 26.8* 07/21/2015 0248   PLT 199 07/21/2015 0248   MCV 85.6 07/21/2015 0248   MCH 27.2 07/21/2015 0248   MCHC 31.7 07/21/2015 0248   RDW 15.3 07/21/2015 0248   LYMPHSABS 0.9 06/23/2015 0250   MONOABS 1.2* 06/23/2015 0250   EOSABS 0.1 06/23/2015 0250   BASOSABS 0.0 06/23/2015 0250    BMET    Component Value Date/Time   NA 136 07/21/2015 0248   K 3.9 07/21/2015 0248   CL 106 07/21/2015 0248   CO2 18* 07/21/2015 0248   GLUCOSE 101* 07/21/2015 0248   BUN 26* 07/21/2015 0248   CREATININE 3.15* 07/21/2015 0248   CREATININE 1.10 11/07/2014 0804   CALCIUM 7.8* 07/21/2015 0248   GFRNONAA 20* 07/21/2015 0248   GFRNONAA 53* 05/14/2013 0905   GFRAA 23* 07/21/2015 0248   GFRAA 62 05/14/2013 0905    INR    Component Value Date/Time   INR 1.48 07/21/2015 0248   INR 3.6 03/10/2015 0828   INR 1.9 07/20/2010 0817     Intake/Output Summary (Last 24 hours) at 07/21/15 0800 Last data filed at 07/20/15 1500  Gross per 24 hour  Intake      0 ml  Output   1700 ml  Net  -1700 ml     Assessment:  65 y.o. male is s/p:  Repair of juxtarenal abdominal aortic aneurysm, left renal artery bypass with readmission  Plan: -nausea has resolved-continue to wean metoclopramide as per GI -continue 10d course of Flagyl every 8hr  -DVT prophylaxis:  May restart coumadin tonight -Creatinine is stable.  TDC removed this am without difficulty.    Leontine Locket, PA-C [facility] [phone] 07/21/2015 8:00 AM  Agree with above Will d/c home today Resume coumadin at prior dose for afib Continue flagyl for 2 more days Follow up renal  Ruta Hinds, MD [facility] [place]: [phone] Pager: [phone]

## 2015-07-22 ENCOUNTER — Telehealth: Payer: Self-pay | Admitting: *Deleted

## 2015-07-22 NOTE — Telephone Encounter (Signed)
Pt discharged from [facility] yesterday.  S/P AAA repair and renal artery bypass.  Per Dr Oneida Alar note it is OK for pt to restart coumadin at previous dose.  Pt will restart coumadin tonight taking 7.5mg  daily except 5mg  on Mondays and Thursdays.  [facility] will be seeing pt and will check INR's until discharge from [facility].  Admission [facility] nurse to call me tomorrow for INR orders.

## 2015-07-23 ENCOUNTER — Telehealth: Payer: Self-pay

## 2015-07-23 ENCOUNTER — Telehealth: Payer: Self-pay | Admitting: Adult Health

## 2015-07-23 ENCOUNTER — Ambulatory Visit (INDEPENDENT_AMBULATORY_CARE_PROVIDER_SITE_OTHER): Payer: Medicare Other | Admitting: *Deleted

## 2015-07-23 ENCOUNTER — Telehealth: Payer: Self-pay | Admitting: Vascular Surgery

## 2015-07-23 DIAGNOSIS — I5042 Chronic combined systolic (congestive) and diastolic (congestive) heart failure: Secondary | ICD-10-CM | POA: Diagnosis not present

## 2015-07-23 DIAGNOSIS — Z48812 Encounter for surgical aftercare following surgery on the circulatory system: Secondary | ICD-10-CM | POA: Diagnosis not present

## 2015-07-23 DIAGNOSIS — E785 Hyperlipidemia, unspecified: Secondary | ICD-10-CM | POA: Diagnosis not present

## 2015-07-23 DIAGNOSIS — I482 Chronic atrial fibrillation: Secondary | ICD-10-CM

## 2015-07-23 DIAGNOSIS — Z7951 Long term (current) use of inhaled steroids: Secondary | ICD-10-CM | POA: Diagnosis not present

## 2015-07-23 DIAGNOSIS — Z7901 Long term (current) use of anticoagulants: Secondary | ICD-10-CM | POA: Diagnosis not present

## 2015-07-23 DIAGNOSIS — I110 Hypertensive heart disease with heart failure: Secondary | ICD-10-CM | POA: Diagnosis not present

## 2015-07-23 DIAGNOSIS — I739 Peripheral vascular disease, unspecified: Secondary | ICD-10-CM | POA: Diagnosis not present

## 2015-07-23 DIAGNOSIS — Z5181 Encounter for therapeutic drug level monitoring: Secondary | ICD-10-CM

## 2015-07-23 DIAGNOSIS — Z95818 Presence of other cardiac implants and grafts: Secondary | ICD-10-CM | POA: Diagnosis not present

## 2015-07-23 DIAGNOSIS — Z8673 Personal history of transient ischemic attack (TIA), and cerebral infarction without residual deficits: Secondary | ICD-10-CM | POA: Diagnosis not present

## 2015-07-23 DIAGNOSIS — I2510 Atherosclerotic heart disease of native coronary artery without angina pectoris: Secondary | ICD-10-CM | POA: Diagnosis not present

## 2015-07-23 DIAGNOSIS — I4821 Permanent atrial fibrillation: Secondary | ICD-10-CM

## 2015-07-23 LAB — POCT INR: INR: 1.6

## 2015-07-23 NOTE — Telephone Encounter (Signed)
-----   Message from Mena Goes, RN sent at 07/21/2015  3:24 PM EDT ----- Regarding: schedule   ----- Message -----    From: Gabriel Earing, PA-C    Sent: 07/21/2015   3:20 PM      To: [facility] Charge Pool  Please have pt seen back in 4 weeks with Dr. Oneida Alar

## 2015-07-23 NOTE — Telephone Encounter (Signed)
Phone # has been disconnected, mailed letter,dpm

## 2015-07-23 NOTE — Telephone Encounter (Signed)
Phone call from [facility] RN.  Reported that 2 areas present on abdominal incision, with a "wire or retained staple" pointing beneath the skin; one located at base of sternum, and the 2nd one is approx. 4 " above the naval.  Stated that the areas appear like there may be a pocket of "purulent" material beneath the skin.  Reported the patient stated there is no pain.  Denied any redness.  Denied fever.  Denied any active drainage.  Appt. given for 3:15 PM on 3/24, with Nurse Practitioner.  Wife agreed with plan.

## 2015-07-23 NOTE — Discharge Summary (Signed)
[facility] Discharge Summary  [name] [name] 10-17-1950 65 y.o. male  [id number]  Admission Date: 07/09/2015  Discharge Date: 07/21/2015  Physician: Ruta Hinds, MD  Admission Diagnosis: Emesis [R11.10]  HPI:   This is a 65 y.o. male who presented for evaluation of nausea and vomiting. Pt recently underwent repair of AAA and left renal bypass complicated by post op renal failure. He was dc'd yesterday but at home was having nausea and vomiting mainly with solid foods. Was able to keep liquids down. He denies fever or chills. He continues to pass flatus.  Hospital Course:   The patient was admitted for hydration and further evaluation on 07/09/2014. His coumadin was held so his tunneled dialysis catheter could be removed.   Hospital Day 1: The patient was started on clear liquids. His IV fluids were continued. His atrial fibrillation was rate controlled.   Hospital Day 2: He still had nausea and vomiting. He had a bowel movement with positive hemoccult study. A CT abd/pelvis with oral contrast was ordered to rule out obstruction. His INR was elevated despite coumadin being held. His hemoglobin was stable. His UOP continued to be good with creatinine trending down. He had mild metabolic acidosis likely renal related. He was placed on sodium bicarbonate.   Hospital Day 3: His CT scan revealed a small lymphocele at the junction of the small bowel wall and small bowel mesentery that may have exerted a local nonobstructing effect on the small bowel. He was tried on clear liquids. The patient had multiple episodes of diarrhea and a c.diff was ordered. The patient also had hypertension with systolic greater than 0000000 despite administration of hydralazine. Metoprolol was ordered. The patient also complained of some chest pain and cardiology was consulted. Serial EKGs and cardiac enzymes were ordered. His electrolytes were repleted.   Hospital Day 4: His c.diff was  positive and started on flagyl. He continued to have nausea and vomiting. His LFTs, pancreatic enzymes, and 2 view abdominal plain films were ordered.  His blood pressure medications were adjusted. His cardiac markers and EKG were not clearly consistent with ACS. Cardiology was only considering catheterization only if he had significant angina given his renal insufficiency.   Hospital Day 5: His abdominal x-ray was unremarkable. His LFTs were a little low and amylase and lipase normal.   Hospital Day 6-7: He continued to have some nausea, vomiting and diarrhea. He denied any chest pain. He was continued on clear liquids. GI was consulted for assistance with nausea and vomiting. He was started on protonix and low dose reglan. An EGD was felt to be low yield.   The remainder of his hospital stay consisted of advancing his diet. His nausea and vomiting improved and diarrhea resolved. He was weaned off of reglan. His INR was 1.5 on hospital day 12 and his tunneled dialysis catheter was removed without complication. His renal function continued to improve with good urine output and decreasing creatinine. He was discharged home on hospital day 12 in good condition. He was continued on flagyl for his c.diff. His home dose coumadin was restarted at discharge.    CBC    Component Value Date/Time   WBC 7.9 07/21/2015 0248   RBC 3.13* 07/21/2015 0248   HGB 8.5* 07/21/2015 0248   HCT 26.8* 07/21/2015 0248   PLT 199 07/21/2015 0248   MCV 85.6 07/21/2015 0248   MCH 27.2 07/21/2015 0248   MCHC 31.7 07/21/2015 0248   RDW 15.3 07/21/2015 0248  LYMPHSABS 0.9 06/23/2015 0250   MONOABS 1.2* 06/23/2015 0250   EOSABS 0.1 06/23/2015 0250   BASOSABS 0.0 06/23/2015 0250    BMET    Component Value Date/Time   NA 136 07/21/2015 0248   K 3.9 07/21/2015 0248   CL 106 07/21/2015 0248   CO2 18* 07/21/2015 0248   GLUCOSE 101* 07/21/2015 0248   BUN 26* 07/21/2015 0248   CREATININE 3.15* 07/21/2015 0248    CREATININE 1.10 11/07/2014 0804   CALCIUM 7.8* 07/21/2015 0248   GFRNONAA 20* 07/21/2015 0248   GFRNONAA 53* 05/14/2013 0905   GFRAA 23* 07/21/2015 0248   GFRAA 62 05/14/2013 0905     Discharge Instructions:   The patient is discharged to home with extensive instructions on wound care and progressive ambulation.  They are instructed not to drive or perform any heavy lifting until returning to see the physician in his office.  Discharge Instructions    Call MD for:  redness, tenderness, or signs of infection (pain, swelling, bleeding, redness, odor or green/yellow discharge around incision site)    Complete by:  As directed      Call MD for:  severe or increased pain, loss or decreased feeling  in affected limb(s)    Complete by:  As directed      Call MD for:  temperature >100.5    Complete by:  As directed      Discharge instructions    Complete by:  As directed   Have your INR checked on 07/23/15.     Discharge wound care:    Complete by:  As directed   Shower daily with soap and water starting 07/22/15     Driving Restrictions    Complete by:  As directed   No driving for 2 weeks     Lifting restrictions    Complete by:  As directed   No lifting for 4 weeks     Resume previous diet    Complete by:  As directed            Discharge Diagnosis:  Emesis [R11.10]  Secondary Diagnosis: Patient Active Problem List   Diagnosis Date Noted  . Precordial pain   . Atrial fibrillation with RVR ([place])   . Dehydration, severe 07/09/2015  . Acute on chronic combined systolic and diastolic CHF (congestive heart failure) ([place]) 06/19/2015  . Renal failure   . Status post abdominal aortic aneurysm (AAA) repair   . History of CVA with residual deficit   . Acute blood loss anemia   . Tachypnea   . Leukocytosis   . Ileus ([place])   . Respiratory failure ([place])   . Acute respiratory failure with hypoxemia ([place])   . Acute on chronic kidney failure (HCC) - essentially anuric   .  Pseudoaneurysm of aorta ([place]) 06/09/2015  . Acute respiratory failure with hypoxia ([place]) 06/09/2015  . CAD - s/p PCI to Cx. 100% RCA CTO 05/28/2015  . Abdominal pain 05/24/2015  . Dysphagia   . Hiatal hernia   . History of colonic polyps   . Hx of adenomatous colonic polyps 05/14/2015  . Constipation 04/22/2015  . Lumbago 04/22/2015  . Insomnia 07/16/2014  . Encounter for therapeutic drug monitoring 07/05/2013  . ED (erectile dysfunction) 06/17/2013  . Testicular mass 06/15/2013  . Chronic kidney disease 09/28/2011  . Seasonal allergies 07/01/2011  . GERD (gastroesophageal reflux disease) 06/01/2011  . Esophageal dysphagia 06/01/2011  . Family history of colon cancer 05/20/2011  . Chronic anticoagulation 09/24/2010  .  Tobacco abuse   . Permanent atrial fibrillation ([place]) 11/13/2009  . ATHEROSCLEROTIC CARDIOVASCULAR DISEASE 11/13/2009  . PERIPHERAL VASCULAR DISEASE 11/13/2009  . Hyperlipidemia 10/24/2008  . Testicular cancer ([place]) 10/22/2008  . Essential hypertension 10/22/2008  . NEPHROLITHIASIS 10/22/2008   Past Medical History  Diagnosis Date  . Arteriosclerotic cardiovascular disease (ASCVD)     AMI in 2000 treated at [facility]; cath in 12/2006->  Chronic total obstruction of the RCA;  drug-eluting stent placed in M1; inferior hypokinesis with an EF of 45%  . Tobacco abuse, in remission     20 pack years; quit in 2009  . Hypertension   . Chronic anticoagulation   . Permanent atrial fibrillation (HCC)     on coumadin for couple of years, stopped plavix at that time  . PVD (peripheral vascular disease) ([place])     Ct angiogram in 2009 revealed stable disease with 80% celiac stenosis,50% right renal artery ,ASCVD with ulceration in the abdominal aortashe  . Nephrolithiasis   . Cerebrovascular disease 2002    carotid stent  . Hyperlipidemia   . Myocardial infarction ([place]) 10 yrs ago  . Testicular carcinoma ([place]) 1990    right orchiectomy  . AAA (abdominal aortic aneurysm)  ([place]) 2017    3.3cm  . GERD (gastroesophageal reflux disease)   . Chronic combined systolic and diastolic CHF (congestive heart failure) (HCC)        Medication List    TAKE these medications        albuterol 108 (90 Base) MCG/ACT inhaler  Commonly known as:  PROVENTIL HFA;VENTOLIN HFA  Inhale 2-4 puffs into the lungs every 4 (four) hours as needed for wheezing or shortness of breath.     amLODipine 10 MG tablet  Commonly known as:  NORVASC  take 1 tablet by mouth once daily     atorvastatin 20 MG tablet  Commonly known as:  LIPITOR  Take 1 tablet (20 mg total) by mouth at bedtime.     diphenhydrAMINE 25 MG tablet  Commonly known as:  BENADRYL  Take 25 mg by mouth daily as needed for allergies.     feeding supplement (NEPRO CARB STEADY) Liqd  Take 237 mLs by mouth 2 (two) times daily between meals.     ferrous sulfate 325 (65 FE) MG tablet  Take 1 tablet (325 mg total) by mouth 3 (three) times daily with meals.     fexofenadine 180 MG tablet  Commonly known as:  ALLEGRA  Take 1 tablet (180 mg total) by mouth daily.     Fish Oil 1000 MG Caps  Take 1,000 mg by mouth daily.     labetalol 200 MG tablet  Commonly known as:  NORMODYNE  Take 1 tablet (200 mg total) by mouth 2 (two) times daily.     metroNIDAZOLE 500 MG tablet  Commonly known as:  FLAGYL  Take 1 tablet (500 mg total) by mouth 3 (three) times daily.     multivitamin with minerals Tabs tablet  Take 1 tablet by mouth daily.     nitroGLYCERIN 0.4 MG SL tablet  Commonly known as:  NITROSTAT  place 1 tablet under the tongue if needed every 5 minutes for chest pain for 3 doses IF NO RELIEF AFTER 3RD DOSE CALL PRESCRIBER OR 911.     ONE-A-DAY MENS PO  Take 1 tablet by mouth daily.     oxyCODONE-acetaminophen 5-325 MG tablet  Commonly known as:  PERCOCET/ROXICET  Take 1 tablet by mouth every 6 (six)  hours as needed for moderate pain.     pantoprazole 40 MG tablet  Commonly known as:  PROTONIX  Take 1  tablet (40 mg total) by mouth daily.     sodium bicarbonate 650 MG tablet  Take 1 tablet (650 mg total) by mouth 3 (three) times daily.     tamsulosin 0.4 MG Caps capsule  Commonly known as:  FLOMAX  Take 1 capsule (0.4 mg total) by mouth daily after supper.     traZODone 50 MG tablet  Commonly known as:  DESYREL  Take 0.5-1 tablets (25-50 mg total) by mouth at bedtime as needed for sleep.     warfarin 5 MG tablet  Commonly known as:  COUMADIN  Take 1 1/2 tablets daily        Disposition: Home  Patient's condition: is Good  Follow up: 1. Dr. Oneida Alar in 2 weeks   Virgina Jock, PA-C [facility] [phone] 07/23/2015  10:17 AM

## 2015-07-23 NOTE — Telephone Encounter (Signed)
Error/tg °

## 2015-07-24 ENCOUNTER — Encounter: Payer: Self-pay | Admitting: Vascular Surgery

## 2015-07-24 ENCOUNTER — Ambulatory Visit (INDEPENDENT_AMBULATORY_CARE_PROVIDER_SITE_OTHER): Payer: Medicare Other | Admitting: Cardiology

## 2015-07-24 ENCOUNTER — Encounter: Payer: Self-pay | Admitting: Cardiology

## 2015-07-24 VITALS — BP 160/80 | HR 128 | Ht 72.0 in | Wt 168.0 lb

## 2015-07-24 DIAGNOSIS — Z79899 Other long term (current) drug therapy: Secondary | ICD-10-CM

## 2015-07-24 DIAGNOSIS — I5022 Chronic systolic (congestive) heart failure: Secondary | ICD-10-CM

## 2015-07-24 DIAGNOSIS — R000 Tachycardia, unspecified: Secondary | ICD-10-CM | POA: Diagnosis not present

## 2015-07-24 DIAGNOSIS — I4891 Unspecified atrial fibrillation: Secondary | ICD-10-CM | POA: Diagnosis not present

## 2015-07-24 DIAGNOSIS — I10 Essential (primary) hypertension: Secondary | ICD-10-CM

## 2015-07-24 MED ORDER — METOPROLOL SUCCINATE ER 100 MG PO TB24: 100.0000 mg | ORAL_TABLET | Freq: Every day | ORAL | Status: DC

## 2015-07-24 NOTE — Progress Notes (Signed)
Patient ID: [name] [name], male   DOB: August 22, 1950, 65 y.o.   MRN: [id number]     Clinical Summary [name] [name] is a 65 y.o.male last seen by NP Purcell Nails, this is our first visit together.  1. Afib - deneis any palpitations. No lightheadness or dizziness. Can have some SOB. - complinat with meds. At some point labetalol was substituted for Toprol XL.  - coumadin followed by our clinic  2. CAD - history of prior stenting as documented below -  Denies any chest pain   3. HTN - compliant with meds - does not check at home.   4. PAD - s/p repair of juxtarenal abdominal aortic aneurysm, left renal artery bypass, followed by vascular  5. Chronic sysotlic HF - ehco [id number] LVEF 35-40% - denies any LE edema, no SOB or DOE.   6. CKD IV - developed after recent vascular surgery, required HD for a period of time.  - no longer requiring HD, still with fairy siginificant renal dysfunction Past Medical History  Diagnosis Date  . Arteriosclerotic cardiovascular disease (ASCVD)     AMI in 2000 treated at [facility]; cath in 12/2006->  Chronic total obstruction of the RCA;  drug-eluting stent placed in M1; inferior hypokinesis with an EF of 45%  . Tobacco abuse, in remission     20 pack years; quit in 2009  . Hypertension   . Chronic anticoagulation   . Permanent atrial fibrillation (HCC)     on coumadin for couple of years, stopped plavix at that time  . PVD (peripheral vascular disease) ([place])     Ct angiogram in 2009 revealed stable disease with 80% celiac stenosis,50% right renal artery ,ASCVD with ulceration in the abdominal aortashe  . Nephrolithiasis   . Cerebrovascular disease 2002    carotid stent  . Hyperlipidemia   . Myocardial infarction ([place]) 10 yrs ago  . Testicular carcinoma ([place]) 1990    right orchiectomy  . AAA (abdominal aortic aneurysm) ([place]) 2017    3.3cm  . GERD (gastroesophageal reflux disease)   . Chronic combined systolic and diastolic CHF (congestive heart failure)  (HCC)      No Known Allergies   Current Outpatient Prescriptions  Medication Sig Dispense Refill  . albuterol (PROVENTIL HFA;VENTOLIN HFA) 108 (90 BASE) MCG/ACT inhaler Inhale 2-4 puffs into the lungs every 4 (four) hours as needed for wheezing or shortness of breath. 1 Inhaler 0  . amLODipine (NORVASC) 10 MG tablet take 1 tablet by mouth once daily 90 tablet 3  . atorvastatin (LIPITOR) 20 MG tablet Take 1 tablet (20 mg total) by mouth at bedtime. 30 tablet 3  . diphenhydrAMINE (BENADRYL) 25 MG tablet Take 25 mg by mouth daily as needed for allergies.    . ferrous sulfate 325 (65 FE) MG tablet Take 1 tablet (325 mg total) by mouth 3 (three) times daily with meals. 90 tablet 3  . fexofenadine (ALLEGRA) 180 MG tablet Take 1 tablet (180 mg total) by mouth daily. 30 tablet 2  . labetalol (NORMODYNE) 200 MG tablet Take 1 tablet (200 mg total) by mouth 2 (two) times daily. 60 tablet 2  . metroNIDAZOLE (FLAGYL) 500 MG tablet Take 1 tablet (500 mg total) by mouth 3 (three) times daily. 7 tablet 0  . Multiple Vitamin (MULITIVITAMIN WITH MINERALS) TABS Take 1 tablet by mouth daily.    . Multiple Vitamin (ONE-A-DAY MENS PO) Take 1 tablet by mouth daily.    . nitroGLYCERIN (NITROSTAT) 0.4 MG SL tablet place  1 tablet under the tongue if needed every 5 minutes for chest pain for 3 doses IF NO RELIEF AFTER 3RD DOSE CALL PRESCRIBER OR 911. 25 tablet 2  . Nutritional Supplements (FEEDING SUPPLEMENT, NEPRO CARB STEADY,) LIQD Take 237 mLs by mouth 2 (two) times daily between meals.  0  . Omega-3 Fatty Acids (FISH OIL) 1000 MG CAPS Take 1,000 mg by mouth daily.   0  . oxyCODONE-acetaminophen (PERCOCET/ROXICET) 5-325 MG tablet Take 1 tablet by mouth every 6 (six) hours as needed for moderate pain. 30 tablet 0  . pantoprazole (PROTONIX) 40 MG tablet Take 1 tablet (40 mg total) by mouth daily. 90 tablet 1  . sodium bicarbonate 650 MG tablet Take 1 tablet (650 mg total) by mouth 3 (three) times daily. 90 tablet 1    . tamsulosin (FLOMAX) 0.4 MG CAPS capsule Take 1 capsule (0.4 mg total) by mouth daily after supper. 30 capsule 2  . traZODone (DESYREL) 50 MG tablet Take 0.5-1 tablets (25-50 mg total) by mouth at bedtime as needed for sleep. (Patient taking differently: Take 50 mg by mouth at bedtime as needed for sleep. ) 30 tablet 3  . warfarin (COUMADIN) 5 MG tablet Take 1 1/2 tablets daily (Patient taking differently: Take 5-7.5 mg by mouth daily. Take one tablet on all days except on Wednesdays and Thursdays take one and one-half tablets) 45 tablet 6   No current facility-administered medications for this visit.     Past Surgical History  Procedure Laterality Date  . Appendectomy  2004  . Testicular cancer  1990    right orchiectomy  . Colonoscopy  06/17/2011    INCOMPLETE, PREP POOR. Procedure: COLONOSCOPY;  Surgeon: Daneil Dolin, MD;  [facility];  Service: Endoscopy;  Laterality: N/A;  10:00  . Esophagogastroduodenoscopy  06/17/2011    severe erosive/ulcerative reflux esophagitis, soft noncritical stricture dilatied, small hh, antral erosion   . Colonoscopy  07/15/2011    [id number] rectal and colon polyps  . Colonoscopy N/A 05/22/2015    Procedure: COLONOSCOPY;  Surgeon: Daneil Dolin, MD;  [facility];  Service: Endoscopy;  Laterality: N/A;  1300 - moved to 2:30 - office to notify  . Esophagogastroduodenoscopy N/A 05/22/2015    Procedure: ESOPHAGOGASTRODUODENOSCOPY (EGD);  Surgeon: Daneil Dolin, MD;  [facility];  Service: Endoscopy;  Laterality: N/A;  . Esophageal dilation N/A 05/22/2015    Procedure: ESOPHAGEAL DILATION;  Surgeon: Daneil Dolin, MD;  [facility];  Service: Endoscopy;  Laterality: N/A;  . Peripheral vascular catheterization N/A 05/28/2015    Procedure: Abdominal Aortogram;  Surgeon: Serafina Mitchell, MD;  Location: [place] [facility];  Service: Cardiovascular;  Laterality: N/A;  . Abdominal aortic aneurysm repair N/A 06/09/2015     Procedure: ANEURYSM ABDOMINAL AORTIC REPAIR;  Surgeon: Elam Dutch, MD;  Location: [place];  Service: Vascular;  Laterality: N/A;  . Aortic/renal bypass Left 06/09/2015    Procedure: LEFT RENAL Artery BYPASS;  Surgeon: Elam Dutch, MD;  Location: [place];  Service: Vascular;  Laterality: Left;  . Aortic endarterecetomy N/A 06/09/2015    Procedure: AORTIC ENDARTERECETOMY;  Surgeon: Elam Dutch, MD;  Location: [place];  Service: Vascular;  Laterality: N/A;  . Insertion of dialysis catheter Left 06/26/2015    Procedure: INSERTION OF DIALYSIS CATHETER;  Surgeon: Angelia Mould, MD;  Location: [place];  Service: Vascular;  Laterality: Left;     No Known Allergies    Family  History  Problem Relation Age of Onset  . Colon cancer Father 72    deceased  . Prostate cancer Father   . Liver disease Neg Hx   . Anesthesia problems Neg Hx   . Hypotension Neg Hx   . Malignant hyperthermia Neg Hx   . Pseudochol deficiency Neg Hx      Social History Mr. Bazinet reports that he quit smoking about 2 months ago. His smoking use included Cigarettes. He has a 4 pack-year smoking history. He has never used smokeless tobacco. Mr. Cagnina reports that he does not drink alcohol.   Review of Systems CONSTITUTIONAL: No weight loss, fever, chills, weakness or fatigue.  HEENT: Eyes: No visual loss, blurred vision, double vision or yellow sclerae.No hearing loss, sneezing, congestion, runny nose or sore throat.  SKIN: No rash or itching.  CARDIOVASCULAR: per HPI RESPIRATORY: No shortness of breath, cough or sputum.  GASTROINTESTINAL: No anorexia, nausea, vomiting or diarrhea. No abdominal pain or blood.  GENITOURINARY: No burning on urination, no polyuria NEUROLOGICAL: No headache, dizziness, syncope, paralysis, ataxia, numbness or tingling in the extremities. No change in bowel or bladder control.  MUSCULOSKELETAL: No muscle, back pain, joint pain or stiffness.  LYMPHATICS: No enlarged nodes. No  history of splenectomy.  PSYCHIATRIC: No history of depression or anxiety.  ENDOCRINOLOGIC: No reports of sweating, cold or heat intolerance. No polyuria or polydipsia.  [name]   Physical Examination Filed Vitals:   07/24/15 1000  BP: 160/80  Pulse: 128   Filed Vitals:   07/24/15 1000  Height: 6' (1.829 m)  Weight: 168 lb (76.204 kg)    Gen: resting comfortably, no acute distress HEENT: no scleral icterus, pupils equal round and reactive, no palptable cervical adenopathy,  CV: RRR, no m/r/g, no jvd Resp: Clear to auscultation bilaterally GI: abdomen is soft, non-tender, non-distended, normal bowel sounds, no hepatosplenomegaly MSK: extremities are warm, no edema.  Skin: warm, no rash Neuro:  no focal deficits Psych: appropriate affect   Diagnostic Studies 06/2015 echo - Left ventricle: The cavity size was normal. Systolic function was  moderately reduced. The estimated ejection fraction was in the  range of 35% to 40%. - Pulmonary arteries: PA peak pressure: 33 mm Hg (S). - Inferior vena cava: The vessel was dilated. The respirophasic  diameter changes were blunted (< 50%). This is a nonspecific  finding during positive pressure ventilation.  Impressions:  - Technically limited study due to poor echo windows and  tachycardia. Consider TEE if more accurate evaluation is  necessary.    Assessment and Plan  1. Afib - EKG in clinic shows afib with rate 110. He denies any symptoms - will change his labetlol to Toprol XL for better rate control and in setting of systolic dysfunction, titrate at follow up as needed for rate control - continue coumadin   2. CAD - no current symptoms, continue current meds  3. HTN - elevated in clinic, he has not yet taken his meds. Follow bp, if remains elevated would consider hydral/nitrates given his systolic dysfunction  4. Chronic systolic HF - appears euvolemic, no current symptoms - change labetolol to Toprol XL in setting  of LV dysfunction - no ACE/ARB/aldactone given poor renal function. Titrate toprol over follow up, consider hydral/nitrates   5. CKD IV - will repeat labs  6. Anemia - repeat labs F/u 1 week to follow up afib with elevated rates and elevated bp's. Try to titrate toprol XL at follow up.  Arnoldo Lenis, M.D.

## 2015-07-24 NOTE — Patient Instructions (Signed)
Medication Instructions:  STOP LABETOLOL  START TOPROL XL 100 MG DAILY   Labwork: Your physician recommends that you return for lab work in: [place] BMET   Testing/Procedures: NONE  Follow-Up: Your physician recommends that you schedule a follow-up appointment in: 1 WEEK    Any Other Special Instructions Will Be Listed Below (If Applicable).  PLEASE KEEP A BLOOD PRESSURE LOG FOR 1 WEEK AND BRING IT TO YOUR NEXT VISIT   If you need a refill on your cardiac medications before your next appointment, please call your pharmacy.

## 2015-07-25 ENCOUNTER — Emergency Department (HOSPITAL_COMMUNITY)
Admission: EM | Admit: 2015-07-25 | Discharge: 2015-07-25 | Disposition: A | Discharge status: Home / Self Care | Payer: Medicare Other | Attending: Emergency Medicine | Admitting: Emergency Medicine

## 2015-07-25 ENCOUNTER — Emergency Department (HOSPITAL_COMMUNITY): Payer: Medicare Other

## 2015-07-25 ENCOUNTER — Encounter (HOSPITAL_COMMUNITY): Payer: Self-pay

## 2015-07-25 ENCOUNTER — Encounter: Payer: Self-pay | Admitting: Family

## 2015-07-25 ENCOUNTER — Ambulatory Visit (INDEPENDENT_AMBULATORY_CARE_PROVIDER_SITE_OTHER): Payer: Medicare Other | Admitting: Family

## 2015-07-25 VITALS — BP 123/67 | HR 88 | Temp 97.9°F | Resp 16 | Ht 72.0 in | Wt 169.0 lb

## 2015-07-25 DIAGNOSIS — I679 Cerebrovascular disease, unspecified: Secondary | ICD-10-CM | POA: Diagnosis not present

## 2015-07-25 DIAGNOSIS — E785 Hyperlipidemia, unspecified: Secondary | ICD-10-CM | POA: Insufficient documentation

## 2015-07-25 DIAGNOSIS — I739 Peripheral vascular disease, unspecified: Secondary | ICD-10-CM | POA: Diagnosis not present

## 2015-07-25 DIAGNOSIS — Z48812 Encounter for surgical aftercare following surgery on the circulatory system: Secondary | ICD-10-CM | POA: Diagnosis not present

## 2015-07-25 DIAGNOSIS — I5042 Chronic combined systolic (congestive) and diastolic (congestive) heart failure: Secondary | ICD-10-CM | POA: Insufficient documentation

## 2015-07-25 DIAGNOSIS — R079 Chest pain, unspecified: Secondary | ICD-10-CM | POA: Diagnosis present

## 2015-07-25 DIAGNOSIS — Z7951 Long term (current) use of inhaled steroids: Secondary | ICD-10-CM | POA: Diagnosis not present

## 2015-07-25 DIAGNOSIS — R0789 Other chest pain: Secondary | ICD-10-CM | POA: Diagnosis not present

## 2015-07-25 DIAGNOSIS — Z87891 Personal history of nicotine dependence: Secondary | ICD-10-CM | POA: Insufficient documentation

## 2015-07-25 DIAGNOSIS — Z720 Tobacco use: Secondary | ICD-10-CM

## 2015-07-25 DIAGNOSIS — I252 Old myocardial infarction: Secondary | ICD-10-CM | POA: Insufficient documentation

## 2015-07-25 DIAGNOSIS — Z9889 Other specified postprocedural states: Secondary | ICD-10-CM

## 2015-07-25 DIAGNOSIS — Z792 Long term (current) use of antibiotics: Secondary | ICD-10-CM | POA: Diagnosis not present

## 2015-07-25 DIAGNOSIS — I2510 Atherosclerotic heart disease of native coronary artery without angina pectoris: Secondary | ICD-10-CM | POA: Insufficient documentation

## 2015-07-25 DIAGNOSIS — I701 Atherosclerosis of renal artery: Secondary | ICD-10-CM

## 2015-07-25 DIAGNOSIS — Z79899 Other long term (current) drug therapy: Secondary | ICD-10-CM | POA: Insufficient documentation

## 2015-07-25 DIAGNOSIS — Z8673 Personal history of transient ischemic attack (TIA), and cerebral infarction without residual deficits: Secondary | ICD-10-CM | POA: Diagnosis not present

## 2015-07-25 DIAGNOSIS — Z7901 Long term (current) use of anticoagulants: Secondary | ICD-10-CM | POA: Diagnosis not present

## 2015-07-25 DIAGNOSIS — I110 Hypertensive heart disease with heart failure: Secondary | ICD-10-CM | POA: Insufficient documentation

## 2015-07-25 DIAGNOSIS — I714 Abdominal aortic aneurysm, without rupture, unspecified: Secondary | ICD-10-CM

## 2015-07-25 DIAGNOSIS — I482 Chronic atrial fibrillation: Secondary | ICD-10-CM | POA: Diagnosis not present

## 2015-07-25 DIAGNOSIS — Z95818 Presence of other cardiac implants and grafts: Secondary | ICD-10-CM | POA: Diagnosis not present

## 2015-07-25 DIAGNOSIS — M79603 Pain in arm, unspecified: Secondary | ICD-10-CM | POA: Diagnosis not present

## 2015-07-25 DIAGNOSIS — F17200 Nicotine dependence, unspecified, uncomplicated: Secondary | ICD-10-CM

## 2015-07-25 LAB — BASIC METABOLIC PANEL
Anion gap: 9 (ref 5–15)
BUN: 36 mg/dL — ABNORMAL HIGH (ref 6–20)
CO2: 23 mmol/L (ref 22–32)
Calcium: 7.7 mg/dL — ABNORMAL LOW (ref 8.9–10.3)
Chloride: 102 mmol/L (ref 101–111)
Creatinine, Ser: 2.9 mg/dL — ABNORMAL HIGH (ref 0.61–1.24)
GFR calc Af Amer: 25 mL/min — ABNORMAL LOW (ref >60)
GFR calc non Af Amer: 22 mL/min — ABNORMAL LOW (ref >60)
Glucose, Bld: 130 mg/dL — ABNORMAL HIGH (ref 65–99)
Potassium: 3.3 mmol/L — ABNORMAL LOW (ref 3.5–5.1)
Sodium: 134 mmol/L — ABNORMAL LOW (ref 135–145)

## 2015-07-25 LAB — CBC
HCT: 28 % — ABNORMAL LOW (ref 39.0–52.0)
Hemoglobin: 9.3 g/dL — ABNORMAL LOW (ref 13.0–17.0)
MCH: 28.3 pg (ref 26.0–34.0)
MCHC: 33.2 g/dL (ref 30.0–36.0)
MCV: 85.1 fL (ref 78.0–100.0)
Platelets: 172 10*3/uL (ref 150–400)
RBC: 3.29 MIL/uL — ABNORMAL LOW (ref 4.22–5.81)
RDW: 15.7 % — ABNORMAL HIGH (ref 11.5–15.5)
WBC: 9.3 10*3/uL (ref 4.0–10.5)

## 2015-07-25 LAB — TROPONIN I: Troponin I: <0.03 ng/mL (ref <0.031)

## 2015-07-25 NOTE — Discharge Instructions (Signed)
Tests show no evidence of a heart attack. Follow-up your primary care doctor/cardiologist or return if worse. Take nitroglycerin for chest pain

## 2015-07-25 NOTE — ED Notes (Signed)
MD at bedside. 

## 2015-07-25 NOTE — Progress Notes (Signed)
    Post-operative Open AAA Repair  History of Present Illness  [name] [name] is a 65 y.o. male who is s/p repair of juxtarenal abdominal aortic aneurysm, left renal artery bypass on 06/09/15 by Dr. Oneida Alar for abdominal aortic aneurysm, left renal artery stenosis.  He is also s/p Removal of temporary L IJ dialysis catheter and Ultrasound guided placement of left IJ tunneled dialysis catheter (28 cm) on 06/26/15 by Dr. Scot Dock.   He returns today after phone call from [facility] RN. Reported that 2 areas present on abdominal incision, with a "wire or retained staple" pointing beneath the skin; one located at base of sternum, and the 2nd one is approx. 4 " above the naval. Stated that the areas appear like there may be a pocket of "purulent" material beneath the skin. Reported the patient stated there is no pain. Denies any redness. Denies any active drainage. Pt states his bowels and kidneys are functioning well.  He no longer needs renal dialysis. He denies fever or chills. He denies pain.   For VQI Use Only  PRE-ADM LIVING: Home  AMB STATUS: Ambulatory with walker  Physical Examination  Filed Vitals:   07/25/15 1452  BP: 123/67  Pulse: 88  Temp: 97.9 F (36.6 C)  TempSrc: Oral  Resp: 16  Height: 6' (1.829 m)  Weight: 169 lb (76.658 kg)  SpO2: 100%   Body mass index is 22.92 kg/(m^2).  Gastrointestinal: soft, NTND, -G/R, - HSM, - palpable masses, - CVAT B. Midline abdominal incision is well healed, no erythema, no drainage   Medical Decision Making  [name] [name] is a 65 y.o. male who is s/p repair of juxtarenal abdominal aortic aneurysm, left renal artery bypass on 06/09/15 by Dr. Oneida Alar for abdominal aortic aneurysm, left renal artery stenosis.    Dr. Bridgett Larsson spoke with pt and wife, and clipped the end of one of the prolene sutures that was protruding. The more proximal prolene suture tip that is almost protruding from pt's skin at midline abdominal suture is not clipable.  No erythema, no signs of infection, no drainage.    The patient will follow up with Dr. Oneida Alar on 08/06/15 as already scheduled. I advised pt and his wife to notify us if they have any further concerns.   Thank you for allowing [place] to participate in this patient's care.  Frederick Marro, Sharmon Leyden, RN, MSN, FNP-C [facility] [place] Office: [phone]  07/25/2015, 3:11 PM  Clinic MD: Bridgett Larsson

## 2015-07-25 NOTE — ED Provider Notes (Signed)
CSN: [id number]     Arrival date & time 07/25/15  1939 History  By signing my name below, I, Irene Pap, attest that this documentation has been prepared under the direction and in the presence of Nat Christen, MD. Electronically Signed: Irene Pap, ED Scribe. 07/25/2015. 11:34 PM.   Chief Complaint  Patient presents with  . Chest Pain   The history is provided by the patient. No language interpreter was used.  HPI Comments: [name] [name] is a 65 y.o. male with a hx of MI, AAA, testicular carcinoma, PVD, ASCVD, A-Fib, HTN, CHF, and cerebrovascular disease brought in by EMS who presents to the Emergency Department complaining of gradually resolving left superior lateral chest pain that radiates down the left arm onset 4 hours ago. Pt had 2 nitroglycerin PTA and states that his pain has since resolved. Pt is a former smoker. Pt recently had AAA repair 06/09/15 in [place] by Dr. Oneida Alar. He denies SOB, nausea, vomiting, or diaphoresis. Cardiologist: Dr. Harl Bowie   Past Medical History  Diagnosis Date  . Arteriosclerotic cardiovascular disease (ASCVD)     AMI in 2000 treated at [facility]; cath in 12/2006->  Chronic total obstruction of the RCA;  drug-eluting stent placed in M1; inferior hypokinesis with an EF of 45%  . Tobacco abuse, in remission     20 pack years; quit in 2009  . Hypertension   . Chronic anticoagulation   . Permanent atrial fibrillation (HCC)     on coumadin for couple of years, stopped plavix at that time  . PVD (peripheral vascular disease) ([place])     Ct angiogram in 2009 revealed stable disease with 80% celiac stenosis,50% right renal artery ,ASCVD with ulceration in the abdominal aortashe  . Nephrolithiasis   . Cerebrovascular disease 2002    carotid stent  . Hyperlipidemia   . Myocardial infarction ([place]) 10 yrs ago  . Testicular carcinoma ([place]) 1990    right orchiectomy  . AAA (abdominal aortic aneurysm) ([place]) 2017    3.3cm  . GERD (gastroesophageal reflux  disease)   . Chronic combined systolic and diastolic CHF (congestive heart failure) [facility])    Past Surgical History  Procedure Laterality Date  . Appendectomy  2004  . Testicular cancer  1990    right orchiectomy  . Colonoscopy  06/17/2011    INCOMPLETE, PREP POOR. Procedure: COLONOSCOPY;  Surgeon: Daneil Dolin, MD;  [facility];  Service: Endoscopy;  Laterality: N/A;  10:00  . Esophagogastroduodenoscopy  06/17/2011    severe erosive/ulcerative reflux esophagitis, soft noncritical stricture dilatied, small hh, antral erosion   . Colonoscopy  07/15/2011    [id number] rectal and colon polyps  . Colonoscopy N/A 05/22/2015    Procedure: COLONOSCOPY;  Surgeon: Daneil Dolin, MD;  [facility];  Service: Endoscopy;  Laterality: N/A;  1300 - moved to 2:30 - office to notify  . Esophagogastroduodenoscopy N/A 05/22/2015    Procedure: ESOPHAGOGASTRODUODENOSCOPY (EGD);  Surgeon: Daneil Dolin, MD;  [facility];  Service: Endoscopy;  Laterality: N/A;  . Esophageal dilation N/A 05/22/2015    Procedure: ESOPHAGEAL DILATION;  Surgeon: Daneil Dolin, MD;  [facility];  Service: Endoscopy;  Laterality: N/A;  . Peripheral vascular catheterization N/A 05/28/2015    Procedure: Abdominal Aortogram;  Surgeon: Serafina Mitchell, MD;  Location: [place] CV LAB;  Service: Cardiovascular;  Laterality: N/A;  . Abdominal aortic aneurysm repair N/A 06/09/2015    Procedure: ANEURYSM ABDOMINAL AORTIC REPAIR;  Surgeon:  Elam Dutch, MD;  Location: [place];  Service: Vascular;  Laterality: N/A;  . Aortic/renal bypass Left 06/09/2015    Procedure: LEFT RENAL Artery BYPASS;  Surgeon: Elam Dutch, MD;  Location: [place];  Service: Vascular;  Laterality: Left;  . Aortic endarterecetomy N/A 06/09/2015    Procedure: AORTIC ENDARTERECETOMY;  Surgeon: Elam Dutch, MD;  Location: [place];  Service: Vascular;  Laterality: N/A;  . Insertion of dialysis catheter Left 06/26/2015     Procedure: INSERTION OF DIALYSIS CATHETER;  Surgeon: Angelia Mould, MD;  Location: [facility] OR;  Service: Vascular;  Laterality: Left;   Family History  Problem Relation Age of Onset  . Colon cancer Father 52    deceased  . Prostate cancer Father   . Liver disease Neg Hx   . Anesthesia problems Neg Hx   . Hypotension Neg Hx   . Malignant hyperthermia Neg Hx   . Pseudochol deficiency Neg Hx    Social History  Substance Use Topics  . Smoking status: Former Smoker -- 0.10 packs/day for 40 years    Types: Cigarettes    Quit date: 05/24/2015  . Smokeless tobacco: Never Used     Comment: Patient smokes up to 5 cigarettes in a week  . Alcohol Use: No    Review of Systems A complete 10 system review of systems was obtained and all systems are negative except as noted in the HPI and PMH.   Allergies  Review of patient's allergies indicates no known allergies.  Home Medications   Prior to Admission medications   Medication Sig Start Date End Date Taking? Authorizing Provider  albuterol (PROVENTIL HFA;VENTOLIN HFA) 108 (90 BASE) MCG/ACT inhaler Inhale 2-4 puffs into the lungs every 4 (four) hours as needed for wheezing or shortness of breath. 04/10/15  Yes Kristen N Ward, DO  amLODipine (NORVASC) 10 MG tablet take 1 tablet by mouth once daily 07/16/14  Yes Alycia Rossetti, MD  atorvastatin (LIPITOR) 20 MG tablet Take 1 tablet (20 mg total) by mouth at bedtime. 07/22/14  Yes Alycia Rossetti, MD  diphenhydrAMINE (BENADRYL) 25 MG tablet Take 25 mg by mouth daily as needed for allergies.   Yes Historical Provider, MD  ferrous sulfate 325 (65 FE) MG tablet Take 1 tablet (325 mg total) by mouth 3 (three) times daily with meals. 07/21/15  Yes Samantha J Rhyne, PA-C  fexofenadine (ALLEGRA) 180 MG tablet Take 1 tablet (180 mg total) by mouth daily. 07/01/11  Yes Alycia Rossetti, MD  lisinopril (PRINIVIL,ZESTRIL) 40 MG tablet Take 40 mg by mouth daily.  05/05/15  Yes Historical Provider, MD   metoprolol succinate (TOPROL-XL) 100 MG 24 hr tablet Take 1 tablet (100 mg total) by mouth daily. Take with or immediately following a meal. 07/24/15  Yes Arnoldo Lenis, MD  metroNIDAZOLE (FLAGYL) 500 MG tablet Take 1 tablet (500 mg total) by mouth 3 (three) times daily. 07/21/15  Yes Samantha J Rhyne, PA-C  Multiple Vitamin (ONE-A-DAY MENS PO) Take 1 tablet by mouth daily.   Yes Historical Provider, MD  nitroGLYCERIN (NITROSTAT) 0.4 MG SL tablet place 1 tablet under the tongue if needed every 5 minutes for chest pain for 3 doses IF NO RELIEF AFTER 3RD DOSE CALL PRESCRIBER OR 911. 12/16/14  Yes Lendon Colonel, NP  Nutritional Supplements (FEEDING SUPPLEMENT, NEPRO CARB STEADY,) LIQD Take 237 mLs by mouth 2 (two) times daily between meals. 07/07/15  Yes Samantha J Rhyne, PA-C  Omega-3 Fatty Acids (FISH  OIL) 1000 MG CAPS Take 1,000 mg by mouth daily.  11/12/11  Yes Yehuda Savannah, MD  oxyCODONE-acetaminophen (PERCOCET/ROXICET) 5-325 MG tablet Take 1 tablet by mouth every 6 (six) hours as needed for moderate pain. 07/07/15  Yes Samantha J Rhyne, PA-C  pantoprazole (PROTONIX) 40 MG tablet Take 1 tablet (40 mg total) by mouth daily. 07/08/15  Yes Alycia Rossetti, MD  sodium bicarbonate 650 MG tablet Take 1 tablet (650 mg total) by mouth 3 (three) times daily. 07/07/15  Yes Samantha J Rhyne, PA-C  tamsulosin (FLOMAX) 0.4 MG CAPS capsule Take 1 capsule (0.4 mg total) by mouth daily after supper. 07/07/15  Yes Samantha J Rhyne, PA-C  traZODone (DESYREL) 50 MG tablet Take 0.5-1 tablets (25-50 mg total) by mouth at bedtime as needed for sleep. Patient taking differently: Take 50 mg by mouth at bedtime as needed for sleep.  07/16/14  Yes Alycia Rossetti, MD  warfarin (COUMADIN) 5 MG tablet Take 1 1/2 tablets daily Patient taking differently: Take 5-7.5 mg by mouth daily. Take one tablet on all days except on Wednesdays and Thursdays take one and one-half tablets 11/07/14  Yes Lendon Colonel, NP   BP 144/94  mmHg  Pulse 95  Temp(Src) 98.6 F (37 C) (Oral)  Resp 26  Ht 6' (1.829 m)  Wt 163 lb (73.936 kg)  BMI 22.10 kg/m2  SpO2 99% Physical Exam  Constitutional: He is oriented to person, place, and time. He appears well-developed and well-nourished.  HENT:  Head: Normocephalic and atraumatic.  Eyes: Conjunctivae and EOM are normal. Pupils are equal, round, and reactive to light.  Neck: Normal range of motion. Neck supple.  Cardiovascular: Normal rate and regular rhythm.   Pulmonary/Chest: Effort normal and breath sounds normal.  Abdominal: Soft. Bowel sounds are normal.  Healing vertical scar that is well appearing  Musculoskeletal: Normal range of motion.  Neurological: He is alert and oriented to person, place, and time.  Skin: Skin is warm and dry.  Psychiatric: He has a normal mood and affect. His behavior is normal.  Nursing note and vitals reviewed.   ED Course  Procedures (including critical care time) DIAGNOSTIC STUDIES: Oxygen Saturation is 100% on RA, normal by my interpretation.    COORDINATION OF CARE: 9:39 PM-Discussed treatment plan which includes labs and chest x-ray with pt at bedside and pt agreed to plan.    Labs Review Labs Reviewed  BASIC METABOLIC PANEL - Abnormal; Notable for the following:    Sodium 134 (*)    Potassium 3.3 (*)    Glucose, Bld 130 (*)    BUN 36 (*)    Creatinine, Ser 2.90 (*)    Calcium 7.7 (*)    GFR calc non Af Amer 22 (*)    GFR calc Af Amer 25 (*)    All other components within normal limits  CBC - Abnormal; Notable for the following:    RBC 3.29 (*)    Hemoglobin 9.3 (*)    HCT 28.0 (*)    RDW 15.7 (*)    All other components within normal limits  TROPONIN I    Imaging Review Dg Chest 2 View  07/25/2015  CLINICAL DATA:  Patient was released from hospital on Monday after an aneurism repair. Patient started complaining of chest pain with left arm pain starting today. htn controlled by meds smoker x 40 yrs EXAM: CHEST  2  VIEW COMPARISON:  07/09/2015 FINDINGS: Stable mildly enlarged cardiac silhouette. No evidence of effusion,  infiltrate, or pneumothorax. Nipple shadows noted. IMPRESSION: No acute cardiopulmonary process. Electronically Signed   By: Suzy Bouchard M.D.   On: 07/25/2015 20:46   I have personally reviewed and evaluated these images and lab results as part of my medical decision-making.   EKG Interpretation None      Date: 07/25/2015  Rate: 87  Rhythm: atrial fib  QRS Axis: normal  Intervals: normal  ST/T Wave abnormalities: normal  Conduction Disutrbances: prolonged QT  Narrative Interpretation: unremarkable    MDM   Final diagnoses:  Chest pain, unspecified chest pain type   Patient had fleeting episodes of chest pain that was relieved with nitroglycerin. He says he is completely normal now. Hemoglobin and creatinine remain at baseline. Troponin negative. EKG shows atrial fibrillation which is not new. Will DC home.  I personally performed the services described in this documentation, which was scribed in my presence. The recorded information has been reviewed and is accurate.     Nat Christen, MD 07/26/15 [phone]

## 2015-07-25 NOTE — ED Notes (Signed)
Pt reported mid sternal chest pain with radiation to his left arm and reported left arm numbness that was relieved after pt took his own nitro at home.  Pt denies pain at this time

## 2015-07-25 NOTE — Patient Instructions (Signed)
Smoking Cessation, Tips for Success If you are ready to quit smoking, congratulations! You have chosen to help yourself be healthier. Cigarettes bring nicotine, tar, carbon monoxide, and other irritants into your body. Your lungs, heart, and blood vessels will be able to work better without these poisons. There are many different ways to quit smoking. Nicotine gum, nicotine patches, a nicotine inhaler, or nicotine nasal spray can help with physical craving. Hypnosis, support groups, and medicines help break the habit of smoking. WHAT THINGS CAN I DO TO MAKE QUITTING EASIER?  Here are some tips to help you quit for good:  Pick a date when you will quit smoking completely. Tell all of your friends and family about your plan to quit on that date.  Do not try to slowly cut down on the number of cigarettes you are smoking. Pick a quit date and quit smoking completely starting on that day.  Throw away all cigarettes.   Clean and remove all ashtrays from your home, work, and car.  On a card, write down your reasons for quitting. Carry the card with you and read it when you get the urge to smoke.  Cleanse your body of nicotine. Drink enough water and fluids to keep your urine clear or pale yellow. Do this after quitting to flush the nicotine from your body.  Learn to predict your moods. Do not let a bad situation be your excuse to have a cigarette. Some situations in your life might tempt you into wanting a cigarette.  Never have "just one" cigarette. It leads to wanting another and another. Remind yourself of your decision to quit.  Change habits associated with smoking. If you smoked while driving or when feeling stressed, try other activities to replace smoking. Stand up when drinking your coffee. Brush your teeth after eating. Sit in a different chair when you read the paper. Avoid alcohol while trying to quit, and try to drink fewer caffeinated beverages. Alcohol and caffeine may urge you to  smoke.  Avoid foods and drinks that can trigger a desire to smoke, such as sugary or spicy foods and alcohol.  Ask people who smoke not to smoke around you.  Have something planned to do right after eating or having a cup of coffee. For example, plan to take a walk or exercise.  Try a relaxation exercise to calm you down and decrease your stress. Remember, you may be tense and nervous for the first 2 weeks after you quit, but this will pass.  Find new activities to keep your hands busy. Play with a pen, coin, or rubber band. Doodle or draw things on paper.  Brush your teeth right after eating. This will help cut down on the craving for the taste of tobacco after meals. You can also try mouthwash.   Use oral substitutes in place of cigarettes. Try using lemon drops, carrots, cinnamon sticks, or chewing gum. Keep them handy so they are available when you have the urge to smoke.  When you have the urge to smoke, try deep breathing.  Designate your home as a nonsmoking area.  If you are a heavy smoker, ask your health care provider about a prescription for nicotine chewing gum. It can ease your withdrawal from nicotine.  Reward yourself. Set aside the cigarette money you save and buy yourself something nice.  Look for support from others. Join a support group or smoking cessation program. Ask someone at home or at work to help you with your plan   to quit smoking.  Always ask yourself, "Do I need this cigarette or is this just a reflex?" Tell yourself, "Today, I choose not to smoke," or "I do not want to smoke." You are reminding yourself of your decision to quit.  Do not replace cigarette smoking with electronic cigarettes (commonly called e-cigarettes). The safety of e-cigarettes is unknown, and some may contain harmful chemicals.  If you relapse, do not give up! Plan ahead and think about what you will do the next time you get the urge to smoke. HOW WILL I FEEL WHEN I QUIT SMOKING? You  may have symptoms of withdrawal because your body is used to nicotine (the addictive substance in cigarettes). You may crave cigarettes, be irritable, feel very hungry, cough often, get headaches, or have difficulty concentrating. The withdrawal symptoms are only temporary. They are strongest when you first quit but will go away within 10-14 days. When withdrawal symptoms occur, stay in control. Think about your reasons for quitting. Remind yourself that these are signs that your body is healing and getting used to being without cigarettes. Remember that withdrawal symptoms are easier to treat than the major diseases that smoking can cause.  Even after the withdrawal is over, expect periodic urges to smoke. However, these cravings are generally short lived and will go away whether you smoke or not. Do not smoke! WHAT RESOURCES ARE AVAILABLE TO HELP ME QUIT SMOKING? Your health care provider can direct you to community resources or hospitals for support, which may include:  Group support.  Education.  Hypnosis.  Therapy.   This information is not intended to replace advice given to you by your health care provider. Make sure you discuss any questions you have with your health care provider.   Document Released: 01/16/2004 Document Revised: 05/10/2014 Document Reviewed: 10/05/2012 Elsevier Interactive Patient Education 2016 [employer].    Steps to Quit Smoking  Smoking tobacco can be harmful to your health and can affect almost every organ in your body. Smoking puts you, and those around you, at risk for developing many serious chronic diseases. Quitting smoking is difficult, but it is one of the best things that you can do for your health. It is never too late to quit. WHAT ARE THE BENEFITS OF QUITTING SMOKING? When you quit smoking, you lower your risk of developing serious diseases and conditions, such as:  Lung cancer or lung disease, such as COPD.  Heart disease.  Stroke.  Heart  attack.  Infertility.  Osteoporosis and bone fractures. Additionally, symptoms such as coughing, wheezing, and shortness of breath may get better when you quit. You may also find that you get sick less often because your body is stronger at fighting off colds and infections. If you are pregnant, quitting smoking can help to reduce your chances of having a baby of low birth weight. HOW DO I GET READY TO QUIT? When you decide to quit smoking, create a plan to make sure that you are successful. Before you quit:  Pick a date to quit. Set a date within the next two weeks to give you time to prepare.  Write down the reasons why you are quitting. Keep this list in places where you will see it often, such as on your bathroom mirror or in your car or wallet.  Identify the people, places, things, and activities that make you want to smoke (triggers) and avoid them. Make sure to take these actions:  Throw away all cigarettes at home, at work,  and in your car.  Throw away smoking accessories, such as Scientist, research (medical).  Clean your car and make sure to empty the ashtray.  Clean your home, including curtains and carpets.  Tell your family, friends, and coworkers that you are quitting. Support from your loved ones can make quitting easier.  Talk with your health care provider about your options for quitting smoking.  Find out what treatment options are covered by your health insurance. WHAT STRATEGIES CAN I USE TO QUIT SMOKING?  Talk with your healthcare provider about different strategies to quit smoking. Some strategies include:  Quitting smoking altogether instead of gradually lessening how much you smoke over a period of time. Research shows that quitting "cold [place]" is more successful than gradually quitting.  Attending in-person counseling to help you build problem-solving skills. You are more likely to have success in quitting if you attend several counseling sessions. Even short  sessions of 10 minutes can be effective.  Finding resources and support systems that can help you to quit smoking and remain smoke-free after you quit. These resources are most helpful when you use them often. They can include:  Online chats with a Social worker.  Telephone quitlines.  Printed Furniture conservator/restorer.  Support groups or group counseling.  Text messaging programs.  Mobile phone applications.  Taking medicines to help you quit smoking. (If you are pregnant or breastfeeding, talk with your health care provider first.) Some medicines contain nicotine and some do not. Both types of medicines help with cravings, but the medicines that include nicotine help to relieve withdrawal symptoms. Your health care provider may recommend:  Nicotine patches, gum, or lozenges.  Nicotine inhalers or sprays.  Non-nicotine medicine that is taken by mouth. Talk with your health care provider about combining strategies, such as taking medicines while you are also receiving in-person counseling. Using these two strategies together makes you more likely to succeed in quitting than if you used either strategy on its own. If you are pregnant or breastfeeding, talk with your health care provider about finding counseling or other support strategies to quit smoking. Do not take medicine to help you quit smoking unless told to do so by your health care provider. WHAT THINGS CAN I DO TO MAKE IT EASIER TO QUIT? Quitting smoking might feel overwhelming at first, but there is a lot that you can do to make it easier. Take these important actions:  Reach out to your family and friends and ask that they support and encourage you during this time. Call telephone quitlines, reach out to support groups, or work with a counselor for support.  Ask people who smoke to avoid smoking around you.  Avoid places that trigger you to smoke, such as bars, parties, or smoke-break areas at work.  Spend time around people who do  not smoke.  Lessen stress in your life, because stress can be a smoking trigger for some people. To lessen stress, try:  Exercising regularly.  Deep-breathing exercises.  Yoga.  Meditating.  Performing a body scan. This involves closing your eyes, scanning your body from head to toe, and noticing which parts of your body are particularly tense. Purposefully relax the muscles in those areas.  Download or purchase mobile phone or tablet apps (applications) that can help you stick to your quit plan by providing reminders, tips, and encouragement. There are many free apps, such as QuitGuide from the [employer] Office manager for Disease Control and Prevention). You can find other support for quitting smoking (smoking  cessation) through smokefree.gov and other websites. HOW WILL I FEEL WHEN I QUIT SMOKING? Within the first 24 hours of quitting smoking, you may start to feel some withdrawal symptoms. These symptoms are usually most noticeable 2-3 days after quitting, but they usually do not last beyond 2-3 weeks. Changes or symptoms that you might experience include:  Mood swings.  Restlessness, anxiety, or irritation.  Difficulty concentrating.  Dizziness.  Strong cravings for sugary foods in addition to nicotine.  Mild weight gain.  Constipation.  Nausea.  Coughing or a sore throat.  Changes in how your medicines work in your body.  A depressed mood.  Difficulty sleeping (insomnia). After the first 2-3 weeks of quitting, you may start to notice more positive results, such as:  Improved sense of smell and taste.  Decreased coughing and sore throat.  Slower heart rate.  Lower blood pressure.  Clearer skin.  The ability to breathe more easily.  Fewer sick days. Quitting smoking is very challenging for most people. Do not get discouraged if you are not successful the first time. Some people need to make many attempts to quit before they achieve long-term success. Do your best to  stick to your quit plan, and talk with your health care provider if you have any questions or concerns.   This information is not intended to replace advice given to you by your health care provider. Make sure you discuss any questions you have with your health care provider.   Document Released: 04/13/2001 Document Revised: 09/03/2014 Document Reviewed: 09/03/2014 Elsevier Interactive Patient Education [employer].

## 2015-07-25 NOTE — ED Notes (Signed)
Returned from xray

## 2015-07-28 ENCOUNTER — Ambulatory Visit (INDEPENDENT_AMBULATORY_CARE_PROVIDER_SITE_OTHER): Payer: Medicare Other | Admitting: *Deleted

## 2015-07-28 ENCOUNTER — Telehealth: Payer: Self-pay | Admitting: Adult Health

## 2015-07-28 DIAGNOSIS — I2510 Atherosclerotic heart disease of native coronary artery without angina pectoris: Secondary | ICD-10-CM | POA: Diagnosis not present

## 2015-07-28 DIAGNOSIS — I4821 Permanent atrial fibrillation: Secondary | ICD-10-CM

## 2015-07-28 DIAGNOSIS — Z5181 Encounter for therapeutic drug level monitoring: Secondary | ICD-10-CM

## 2015-07-28 DIAGNOSIS — I110 Hypertensive heart disease with heart failure: Secondary | ICD-10-CM | POA: Diagnosis not present

## 2015-07-28 DIAGNOSIS — I739 Peripheral vascular disease, unspecified: Secondary | ICD-10-CM | POA: Diagnosis not present

## 2015-07-28 DIAGNOSIS — E785 Hyperlipidemia, unspecified: Secondary | ICD-10-CM | POA: Diagnosis not present

## 2015-07-28 DIAGNOSIS — I482 Chronic atrial fibrillation: Secondary | ICD-10-CM | POA: Diagnosis not present

## 2015-07-28 DIAGNOSIS — Z95818 Presence of other cardiac implants and grafts: Secondary | ICD-10-CM | POA: Diagnosis not present

## 2015-07-28 DIAGNOSIS — Z8673 Personal history of transient ischemic attack (TIA), and cerebral infarction without residual deficits: Secondary | ICD-10-CM | POA: Diagnosis not present

## 2015-07-28 DIAGNOSIS — Z7901 Long term (current) use of anticoagulants: Secondary | ICD-10-CM | POA: Diagnosis not present

## 2015-07-28 DIAGNOSIS — Z7951 Long term (current) use of inhaled steroids: Secondary | ICD-10-CM | POA: Diagnosis not present

## 2015-07-28 DIAGNOSIS — I5042 Chronic combined systolic (congestive) and diastolic (congestive) heart failure: Secondary | ICD-10-CM | POA: Diagnosis not present

## 2015-07-28 DIAGNOSIS — Z48812 Encounter for surgical aftercare following surgery on the circulatory system: Secondary | ICD-10-CM | POA: Diagnosis not present

## 2015-07-28 LAB — POCT INR: INR: 2.8

## 2015-07-28 NOTE — Telephone Encounter (Signed)
Patient's BP has been running around 130/60 then "145, 146, 143" but today the nurse took his BP and it was 170/82. Patient is scheduled to see Jory Sims, NP on Friday, 08/01/15 so the nurse will have him continue to monitor and document his BP daily. She said she would be sure to remind him to bring his BP readings to his appointment with Curt Bears.

## 2015-07-28 NOTE — Telephone Encounter (Signed)
I spoke with HHN Tabathaand she will see patient again on thursday and do repeat BP then and call me

## 2015-07-29 ENCOUNTER — Encounter: Payer: Self-pay | Admitting: Vascular Surgery

## 2015-07-31 DIAGNOSIS — I5042 Chronic combined systolic (congestive) and diastolic (congestive) heart failure: Secondary | ICD-10-CM | POA: Diagnosis not present

## 2015-07-31 DIAGNOSIS — Z8673 Personal history of transient ischemic attack (TIA), and cerebral infarction without residual deficits: Secondary | ICD-10-CM | POA: Diagnosis not present

## 2015-07-31 DIAGNOSIS — Z7901 Long term (current) use of anticoagulants: Secondary | ICD-10-CM | POA: Diagnosis not present

## 2015-07-31 DIAGNOSIS — I482 Chronic atrial fibrillation: Secondary | ICD-10-CM | POA: Diagnosis not present

## 2015-07-31 DIAGNOSIS — Z95818 Presence of other cardiac implants and grafts: Secondary | ICD-10-CM | POA: Diagnosis not present

## 2015-07-31 DIAGNOSIS — I739 Peripheral vascular disease, unspecified: Secondary | ICD-10-CM | POA: Diagnosis not present

## 2015-07-31 DIAGNOSIS — I2510 Atherosclerotic heart disease of native coronary artery without angina pectoris: Secondary | ICD-10-CM | POA: Diagnosis not present

## 2015-07-31 DIAGNOSIS — Z48812 Encounter for surgical aftercare following surgery on the circulatory system: Secondary | ICD-10-CM | POA: Diagnosis not present

## 2015-07-31 DIAGNOSIS — Z7951 Long term (current) use of inhaled steroids: Secondary | ICD-10-CM | POA: Diagnosis not present

## 2015-07-31 DIAGNOSIS — E785 Hyperlipidemia, unspecified: Secondary | ICD-10-CM | POA: Diagnosis not present

## 2015-07-31 DIAGNOSIS — I110 Hypertensive heart disease with heart failure: Secondary | ICD-10-CM | POA: Diagnosis not present

## 2015-08-01 ENCOUNTER — Ambulatory Visit (INDEPENDENT_AMBULATORY_CARE_PROVIDER_SITE_OTHER): Payer: Medicare Other | Admitting: Adult Health

## 2015-08-01 ENCOUNTER — Encounter: Payer: Self-pay | Admitting: Adult Health

## 2015-08-01 VITALS — BP 140/80 | HR 77 | Ht 72.0 in | Wt 165.0 lb

## 2015-08-01 DIAGNOSIS — I5042 Chronic combined systolic (congestive) and diastolic (congestive) heart failure: Secondary | ICD-10-CM | POA: Diagnosis not present

## 2015-08-01 DIAGNOSIS — Z95818 Presence of other cardiac implants and grafts: Secondary | ICD-10-CM | POA: Diagnosis not present

## 2015-08-01 DIAGNOSIS — Z8673 Personal history of transient ischemic attack (TIA), and cerebral infarction without residual deficits: Secondary | ICD-10-CM | POA: Diagnosis not present

## 2015-08-01 DIAGNOSIS — I714 Abdominal aortic aneurysm, without rupture, unspecified: Secondary | ICD-10-CM

## 2015-08-01 DIAGNOSIS — I482 Chronic atrial fibrillation: Secondary | ICD-10-CM

## 2015-08-01 DIAGNOSIS — I10 Essential (primary) hypertension: Secondary | ICD-10-CM

## 2015-08-01 DIAGNOSIS — I739 Peripheral vascular disease, unspecified: Secondary | ICD-10-CM | POA: Diagnosis not present

## 2015-08-01 DIAGNOSIS — I4821 Permanent atrial fibrillation: Secondary | ICD-10-CM

## 2015-08-01 DIAGNOSIS — I110 Hypertensive heart disease with heart failure: Secondary | ICD-10-CM | POA: Diagnosis not present

## 2015-08-01 DIAGNOSIS — Z48812 Encounter for surgical aftercare following surgery on the circulatory system: Secondary | ICD-10-CM | POA: Diagnosis not present

## 2015-08-01 DIAGNOSIS — Z7901 Long term (current) use of anticoagulants: Secondary | ICD-10-CM | POA: Diagnosis not present

## 2015-08-01 DIAGNOSIS — Z7951 Long term (current) use of inhaled steroids: Secondary | ICD-10-CM | POA: Diagnosis not present

## 2015-08-01 DIAGNOSIS — I2510 Atherosclerotic heart disease of native coronary artery without angina pectoris: Secondary | ICD-10-CM | POA: Diagnosis not present

## 2015-08-01 DIAGNOSIS — E785 Hyperlipidemia, unspecified: Secondary | ICD-10-CM | POA: Diagnosis not present

## 2015-08-01 NOTE — Progress Notes (Deleted)
Name: [name] [name]    DOB: 02-21-1951  Age: 65 y.o.  MR#: [id number]       PCP:  Vic Blackbird, MD      Insurance: Payor: [employer] MEDICARE / Plan: [facility] MEDICARE / Product Type: *No Product type* /   CC:   No chief complaint on file.   VS Filed Vitals:   08/01/15 1447  BP: 140/80  Pulse: 77  Height: 6' (1.829 m)  Weight: 165 lb (74.844 kg)  SpO2: 98%    Weights Current Weight  08/01/15 165 lb (74.844 kg)  07/25/15 163 lb (73.936 kg)  07/25/15 169 lb (76.658 kg)    Blood Pressure  BP Readings from Last 3 Encounters:  08/01/15 140/80  07/25/15 151/96  07/25/15 123/67     Admit date:  (Not on file) Last encounter with RMR:  07/28/2015   Allergy Review of patient's allergies indicates no known allergies.  Current Outpatient Prescriptions  Medication Sig Dispense Refill  . albuterol (PROVENTIL HFA;VENTOLIN HFA) 108 (90 BASE) MCG/ACT inhaler Inhale 2-4 puffs into the lungs every 4 (four) hours as needed for wheezing or shortness of breath. 1 Inhaler 0  . amLODipine (NORVASC) 10 MG tablet take 1 tablet by mouth once daily 90 tablet 3  . atorvastatin (LIPITOR) 20 MG tablet Take 1 tablet (20 mg total) by mouth at bedtime. 30 tablet 3  . diphenhydrAMINE (BENADRYL) 25 MG tablet Take 25 mg by mouth daily as needed for allergies.    . ferrous sulfate 325 (65 FE) MG tablet Take 1 tablet (325 mg total) by mouth 3 (three) times daily with meals. 90 tablet 3  . fexofenadine (ALLEGRA) 180 MG tablet Take 1 tablet (180 mg total) by mouth daily. 30 tablet 2  . lisinopril (PRINIVIL,ZESTRIL) 40 MG tablet Take 40 mg by mouth daily.   0  . metoprolol succinate (TOPROL-XL) 100 MG 24 hr tablet Take 1 tablet (100 mg total) by mouth daily. Take with or immediately following a meal. 90 tablet 3  . metroNIDAZOLE (FLAGYL) 500 MG tablet Take 1 tablet (500 mg total) by mouth 3 (three) times daily. 7 tablet 0  . Multiple Vitamin (ONE-A-DAY MENS PO) Take 1 tablet by mouth daily.    .  nitroGLYCERIN (NITROSTAT) 0.4 MG SL tablet place 1 tablet under the tongue if needed every 5 minutes for chest pain for 3 doses IF NO RELIEF AFTER 3RD DOSE CALL PRESCRIBER OR 911. 25 tablet 2  . Nutritional Supplements (FEEDING SUPPLEMENT, NEPRO CARB STEADY,) LIQD Take 237 mLs by mouth 2 (two) times daily between meals.  0  . Omega-3 Fatty Acids (FISH OIL) 1000 MG CAPS Take 1,000 mg by mouth daily.   0  . oxyCODONE-acetaminophen (PERCOCET/ROXICET) 5-325 MG tablet Take 1 tablet by mouth every 6 (six) hours as needed for moderate pain. 30 tablet 0  . pantoprazole (PROTONIX) 40 MG tablet Take 1 tablet (40 mg total) by mouth daily. 90 tablet 1  . sodium bicarbonate 650 MG tablet Take 1 tablet (650 mg total) by mouth 3 (three) times daily. 90 tablet 1  . tamsulosin (FLOMAX) 0.4 MG CAPS capsule Take 1 capsule (0.4 mg total) by mouth daily after supper. 30 capsule 2  . traZODone (DESYREL) 50 MG tablet Take 0.5-1 tablets (25-50 mg total) by mouth at bedtime as needed for sleep. (Patient taking differently: Take 50 mg by mouth at bedtime as needed for sleep. ) 30 tablet 3  . warfarin (COUMADIN) 5 MG tablet Take 1 1/2  tablets daily (Patient taking differently: Take 5-7.5 mg by mouth daily. Take one tablet on all days except on Wednesdays and Thursdays take one and one-half tablets) 45 tablet 6   No current facility-administered medications for this visit.    Discontinued Meds:   There are no discontinued medications.  Patient Active Problem List   Diagnosis Date Noted  . Precordial pain   . Atrial fibrillation with RVR ([place])   . Dehydration, severe 07/09/2015  . Acute on chronic combined systolic and diastolic CHF (congestive heart failure) ([place]) 06/19/2015  . Renal failure   . Status post abdominal aortic aneurysm (AAA) repair   . History of CVA with residual deficit   . Acute blood loss anemia   . Tachypnea   . Leukocytosis   . Ileus ([place])   . Respiratory failure ([place])   . Acute respiratory  failure with hypoxemia ([place])   . Acute on chronic kidney failure (HCC) - essentially anuric   . 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 Acute respiratory failure with hypoxia ([place]) 06/09/2015  . CAD - s/p PCI to Cx. 100% RCA CTO 05/28/2015  . Abdominal pain 05/24/2015  . Dysphagia   . Hiatal hernia   . History of colonic polyps   . Hx of adenomatous colonic polyps 05/14/2015  . Constipation 04/22/2015  . Lumbago 04/22/2015  . Insomnia 07/16/2014  . Encounter for therapeutic drug monitoring 07/05/2013  . ED (erectile dysfunction) 06/17/2013  . Testicular mass 06/15/2013  . Chronic kidney disease 09/28/2011  . Seasonal allergies 07/01/2011  . GERD (gastroesophageal reflux disease) 06/01/2011  . Esophageal dysphagia 06/01/2011  . Family history of colon cancer 05/20/2011  . Chronic anticoagulation 09/24/2010  . Tobacco abuse   . Permanent atrial fibrillation ([place]) 11/13/2009  . ATHEROSCLEROTIC CARDIOVASCULAR DISEASE 11/13/2009  . PERIPHERAL VASCULAR DISEASE 11/13/2009  . Hyperlipidemia 10/24/2008  . Testicular cancer ([place]) 10/22/2008  . Essential hypertension 10/22/2008  . NEPHROLITHIASIS 10/22/2008    LABS    Component Value Date/Time   NA 134* 07/25/2015 2040   NA 136 07/21/2015 0248   NA 137 07/20/2015 0230   K 3.3* 07/25/2015 2040   K 3.9 07/21/2015 0248   K 3.9 07/20/2015 0230   CL 102 07/25/2015 2040   CL 106 07/21/2015 0248   CL 110 07/20/2015 0230   CO2 23 07/25/2015 2040   CO2 18* 07/21/2015 0248   CO2 17* 07/20/2015 0230   GLUCOSE 130* 07/25/2015 2040   GLUCOSE 101* 07/21/2015 0248   GLUCOSE 90 07/20/2015 0230   BUN 36* 07/25/2015 2040   BUN 26* 07/21/2015 0248   BUN 23* 07/20/2015 0230   CREATININE 2.90* 07/25/2015 2040   CREATININE 3.15* 07/21/2015 0248   CREATININE 2.94* 07/20/2015 0230   CREATININE 1.10 11/07/2014 0804   CREATININE 1.13 07/16/2014 1137   CREATININE 1.42* 05/14/2013 0905   CALCIUM 7.7* 07/25/2015 2040   CALCIUM 7.8* 07/21/2015  0248   CALCIUM 7.8* 07/20/2015 0230   GFRNONAA 22* 07/25/2015 2040   GFRNONAA 20* 07/21/2015 0248   GFRNONAA 21* 07/20/2015 0230   GFRNONAA 53* 05/14/2013 0905   GFRAA 25* 07/25/2015 2040   GFRAA 23* 07/21/2015 0248   GFRAA 25* 07/20/2015 0230   GFRAA 62 05/14/2013 0905   CMP     Component Value Date/Time   NA 134* 07/25/2015 2040   K 3.3* 07/25/2015 2040   CL 102 07/25/2015 2040   CO2 23 07/25/2015 2040   GLUCOSE 130* 07/25/2015 2040   BUN 36* 07/25/2015 2040  CREATININE 2.90* 07/25/2015 2040   CREATININE 1.10 11/07/2014 0804   CALCIUM 7.7* 07/25/2015 2040   PROT 7.2 07/13/2015 1025   ALBUMIN 2.1* 07/13/2015 1025   AST 11* 07/13/2015 1025   ALT 10* 07/13/2015 1025   ALKPHOS 82 07/13/2015 1025   BILITOT 0.7 07/13/2015 1025   GFRNONAA 22* 07/25/2015 2040   GFRNONAA 53* 05/14/2013 0905   GFRAA 25* 07/25/2015 2040   GFRAA 62 05/14/2013 0905       Component Value Date/Time   WBC 9.3 07/25/2015 2040   WBC 7.9 07/21/2015 0248   WBC 8.3 07/20/2015 0230   HGB 9.3* 07/25/2015 2040   HGB 8.5* 07/21/2015 0248   HGB 8.5* 07/20/2015 0230   HCT 28.0* 07/25/2015 2040   HCT 26.8* 07/21/2015 0248   HCT 26.9* 07/20/2015 0230   MCV 85.1 07/25/2015 2040   MCV 85.6 07/21/2015 0248   MCV 85.7 07/20/2015 0230    Lipid Panel     Component Value Date/Time   CHOL 141 11/07/2014 0804   TRIG 97 06/23/2015 0250   HDL 29* 11/07/2014 0804   CHOLHDL 4.9 11/07/2014 0804   VLDL 53* 11/07/2014 0804   LDLCALC 59 11/07/2014 0804    ABG    Component Value Date/Time   PHART 7.435 06/15/2015 1333   PCO2ART 37.0 06/15/2015 1333   PO2ART 102.0* 06/15/2015 1333   HCO3 24.8* 06/15/2015 1333   TCO2 26 06/15/2015 1333   ACIDBASEDEF 1.0 06/12/2015 0940   O2SAT 98.0 06/15/2015 1333     Lab Results  Component Value Date   TSH 1.030 06/13/2015   BNP (last 3 results) No results for input(s): BNP in the last 8760 hours.  ProBNP (last 3 results) No results for input(s): PROBNP in the  last 8760 hours.  Cardiac Panel (last 3 results) No results for input(s): CKTOTAL, CKMB, TROPONINI, RELINDX in the last 72 hours.  Iron/TIBC/Ferritin/ %Sat    Component Value Date/Time   IRON 18* 06/27/2015 0325   TIBC 167* 06/27/2015 0325   FERRITIN 562* 06/27/2015 0325   IRONPCTSAT 11* 06/27/2015 0325     EKG Orders placed or performed during the hospital encounter of 07/25/15  . ED EKG within 10 minutes  . ED EKG within 10 minutes  . EKG     Prior Assessment and Plan Problem List as of 08/01/2015      Cardiovascular and Mediastinum   Essential hypertension   Last Assessment & Plan 04/22/2015 Office Visit Written 04/22/2015  4:58 PM by Alycia Rossetti, MD    Blood pressure high normal today are not continued this medication will have to monitor this.      Permanent atrial fibrillation [facility])   Last Assessment & Plan 09/26/2013 Office Visit Written 09/27/2013  7:55 AM by Alycia Rossetti, MD    No recent chest pain or shortness of breath he is on Coumadin therapy by cardiology      CAD - s/p PCI to Cx. 100% RCA CTO   ATHEROSCLEROTIC CARDIOVASCULAR DISEASE   Last Assessment & Plan 05/14/2013 Office Visit Written 05/14/2013 11:00 AM by Alycia Rossetti, MD    Followed by cardiology has not had any chest pain and use of nitroglycerin      PERIPHERAL VASCULAR DISEASE   Last Assessment & Plan 08/20/2010 Office Visit Written 08/20/2010  5:06 PM by Yehuda Savannah, MD    ABIs showed no significant obstruction of flow to the lower extremities; Leg discomfort apparently did not represent claudication and has resolved  without specific therapy.      Pseudoaneurysm of aorta (HCC)   Acute on chronic combined systolic and diastolic CHF (congestive heart failure) (HCC)   Atrial fibrillation with RVR ([place])     Respiratory   Seasonal allergies   Last Assessment & Plan 09/28/2011 Office Visit Written 09/28/2011  4:10 PM by Alycia Rossetti, MD    He is to continue his Allegra.       Hiatal hernia   Acute respiratory failure with hypoxia (HCC)   Acute respiratory failure with hypoxemia (HCC)   Respiratory failure (HCC)     Digestive   GERD (gastroesophageal reflux disease)   Last Assessment & Plan 05/14/2015 Office Visit Written 05/14/2015  2:04 PM by Mahala Menghini, PA-C    History of erosive/ulcerative reflux esophagitis in 2013 with soft peptic stricture requiring dilation. Overall his symptoms of heartburn are fairly well-controlled on Protonix. He has had gradual recurrence of dysphagia and likely has developed esophageal stricture or ring. Offered upper endoscopy with dilation in the near future with Dr. Gala Romney. We will hold Coumadin 4 days prior to procedure. Cardiology has been notified who plans to hold Coumadin.  I have discussed the risks, alternatives, benefits with regards to but not limited to the risk of reaction to medication, bleeding, infection, perforation and the patient is agreeable to proceed. Written consent to be obtained.       Esophageal dysphagia   Last Assessment & Plan 06/01/2011 Office Visit Written 06/01/2011 10:11 AM by Mahala Menghini, PA    Chronic intermittent GERD for years, solid food esophageal dysphagia. Recommend EGD with dilation in the near future. Rule out Barrett's esophagus. Plan hold Coumadin for 4 days prior to procedure. We'll touch base with the Coumadin clinic to verify that this is okay.  I have discussed the risks, alternatives, benefits with regards to but not limited to the risk of reaction to medication, bleeding, infection, perforation and the patient is agreeable to proceed. Written consent to be obtained.       Constipation   Last Assessment & Plan 04/22/2015 Office Visit Written 04/22/2015  4:58 PM by Alycia Rossetti, MD    We'll treat his constipation with Linzess, as he has had bleeding polyps in the past also set him up for repeat colonoscopy is been 3 years.      Dysphagia   Ileus [facility])     Genitourinary    Testicular cancer [facility])   Last Assessment & Plan 05/20/2011 Office Visit Written 05/23/2011  5:20 PM by Alycia Rossetti, MD    Remote history of testicular cancer,no current therapy      NEPHROLITHIASIS   Chronic kidney disease   Last Assessment & Plan 09/26/2013 Office Visit Written 09/27/2013  7:55 AM by Alycia Rossetti, MD    Recheck renal function      ED (erectile dysfunction)   Last Assessment & Plan 06/15/2013 Office Visit Written 06/17/2013  7:23 AM by Alycia Rossetti, MD    Check Testosterone level and PSA      Acute on chronic kidney failure [facility]) - essentially anuric   Renal failure     Other   Hyperlipidemia   Last Assessment & Plan 09/26/2013 Office Visit Written 09/27/2013  7:55 AM by Alycia Rossetti, MD    Chest Lipitor as needed goals of LDL less than 100 with history of peripheral vascular disease coronary artery disease      Chronic anticoagulation   Last Assessment & Plan  11/12/2011 Office Visit Written 11/12/2011  2:28 PM by Yehuda Savannah, MD    Stable and therapeutic anticoagulation without adverse effects.  We will continue to monitor for possible occult GI blood loss.      Tobacco abuse   Family history of colon cancer   Last Assessment & Plan 07/05/2011 Office Visit Written 07/05/2011  9:56 AM by Mahala Menghini, PA    Second attempt for colonoscopy. Patient misunderstood bowel prep last time so colonoscopy was incomplete secondary to solid stool beyond sigmoid colon. Add dulcolax 10mg  po daily for three days before procedure. Will hold coumadin four days before procedure like last time, previously approved by cardiologist. Patient to call if any questions about prep. Full detailed verbal explanation given today along with handout.      Testicular mass   Last Assessment & Plan 06/15/2013 Office Visit Written 06/17/2013  7:22 AM by Alycia Rossetti, MD    Ultrasound to be done, as mass like lesion present for many years on the same side as his testicular cancer and  has never been evaluated He has not had any pain from the region      Encounter for therapeutic drug monitoring   Insomnia   Last Assessment & Plan 07/16/2014 Office Visit Written 07/16/2014  1:43 PM by Alycia Rossetti, MD     Trial of trazodone for his sleep      [place] 04/22/2015 Office Visit Written 04/22/2015  4:59 PM by Alycia Rossetti, MD    Recent URI infection resolved. I may give him hydrocodone 30 tablets when necessary for back pain. No red flags on exam      Hx of adenomatous colonic polyps   Last Assessment & Plan 05/14/2015 Office Visit Written 05/14/2015  2:05 PM by Mahala Menghini, PA-C    Due for surveillance colonoscopy given history of multiple adenomatous colon polyps. Suboptimal prep last time. Discussed prep extensively with patient today. He will stop Coumadin 4 days before procedure. Start Dulcolax 10 mg daily 3 days before bowel prep. 2 full days of clear liquids. Split prep utilizing TriLyte.  I have discussed the risks, alternatives, benefits with regards to but not limited to the risk of reaction to medication, bleeding, infection, perforation and the patient is agreeable to proceed. Written consent to be obtained.       History of colonic polyps   Abdominal pain   Status post abdominal aortic aneurysm (AAA) repair   History of CVA with residual deficit   Acute blood loss anemia   Tachypnea   Leukocytosis   Dehydration, severe   Precordial pain       Imaging: Ct Abdomen Pelvis Wo Contrast  07/11/2015  CLINICAL DATA:  65 year old male with nausea and vomiting. Evaluate for obstruction or colitis. Patient is status post repair of aortic pseudoaneurysm with left renal bypass. EXAM: CT ABDOMEN AND PELVIS WITHOUT CONTRAST TECHNIQUE: Multidetector CT imaging of the abdomen and pelvis was performed following the standard protocol without IV contrast. COMPARISON:  Prior CTA abdomen 05/26/2015 FINDINGS: Lower chest: Small bilateral layering  pleural effusions. Associated mild lower lobe atelectasis. Interval development of all multifocal irregular nodular opacities in a peribronchovascular distribution throughout the visualized portion of both lower lungs. These are new compared to January of 2017 and favored to represent an active infectious/inflammatory process. Stable cardiac size with mild left ventricular dilatation. No pericardial effusion. Unremarkable visualized distal thoracic esophagus. Hepatobiliary: Normal hepatic contour and morphology.  No discrete hepatic lesion. Gallbladder is unremarkable. No intra or extrahepatic biliary ductal dilatation. Pancreas: No mass or inflammatory process identified on this un-enhanced exam. Spleen: Within normal limits in size. Adrenals/Urinary Tract: Similar appearance of 1.4 cm nodule in the left adrenal gland. Right renal atrophy is again noted. Nonobstructing nephrolithiasis in the left upper pole. Stomach/Bowel: No evidence of obstruction or focal bowel wall thickening. The terminal ileum is unremarkable. The appendix is surgically absent. There is a circumscribed water attenuation fluid collection within the mesenteries of the small bowel left of midline adjacent to the surgical bed measuring approximately 3.9 by 5.2 by 3.2 cm. This abuts the bowel surface and exerts local mass effect but does not appear to be obstructing given the absence of dilatation proximally. This fluid collection could also be within the small bowel wall although this is felt less likely. Vascular/Lymphatic: No pathologically enlarged lymph nodes. Surgical changes of repair of the suprarenal aortic pseudoaneurysm with Dacron graft in surrounding the felt pledget. Left renal artery bypass graft is somewhat difficult to visualize in the absence of intravenous contrast. Reproductive: No mass or other significant abnormality. Other: Expected postoperative changes in the retroperitoneum consistent with de chronic graft repair of the  aortic pseudoaneurysm and de necrotic graft bypass of the left renal artery. There is mild nonspecific left perinephric edema. Musculoskeletal:  No suspicious bone lesions identified. IMPRESSION: 1. Interval development of a 3.9 x 5.2 x 3.2 cm circumscribed ovoid fluid collection at the junction of the small bowel wall and small bowel mesentery in the mid abdomen just left of midline in the region of the retroperitoneal operative field. This fluid collection exerts local mass effect on the small bowel but is not obstructing. There is no dilation of small bowel proximally and the ingested oral contrast material has passed distal to this region. This is likely an incidental finding and may not be related to the patient's clinical symptoms. Statistically, this most likely represents a postoperative seroma or lymphocele. Infection is difficult to exclude entirely but there are no suspicious imaging features. 2. Expected postoperative changes of Dacron graft aortic repair and left renal arterial bypass with expected retroperitoneal and perinephric stranding. Patency of the left renal graft cannot be assessed in the absence of intravenous contrast. 3. Mild left renal edema and perinephric stranding. 4. Interval development of numerous nodular opacities in the visualized lung bases in a peribronchovascular distribution. This is most consistent with an active infectious or inflammatory process including multifocal pneumonia, or aspiration given the clinical history of frequent vomiting. 5. Small bilateral layering pleural effusions. 6. Stable right renal atrophy. Electronically Signed   By: Jacqulynn Cadet M.D.   On: 07/11/2015 15:32   Dg Chest 2 View  07/25/2015  CLINICAL DATA:  Patient was released from hospital on Monday after an aneurism repair. Patient started complaining of chest pain with left arm pain starting today. htn controlled by meds smoker x 40 yrs EXAM: CHEST  2 VIEW COMPARISON:  07/09/2015 FINDINGS:  Stable mildly enlarged cardiac silhouette. No evidence of effusion, infiltrate, or pneumothorax. Nipple shadows noted. IMPRESSION: No acute cardiopulmonary process. Electronically Signed   By: Suzy Bouchard M.D.   On: 07/25/2015 20:46   Dg Abd 2 Views  07/13/2015  CLINICAL DATA:  65 year old male history of recent abdominal aortic aneurysm repair. Postoperative complication. No abdominal pain. EXAM: ABDOMEN - 2 VIEW COMPARISON:  CT the abdomen and pelvis 07/11/2015. FINDINGS: Gas and stool are noted throughout the colon. Paucity of distal rectal  gas. Nondilated gas-filled loops of small bowel are noted throughout the central abdomen, measuring up to 2.8 cm in diameter, which is nonspecific. No pathologic dilatation of small bowel. No pneumoperitoneum. Rim calcified structure to the left of the lumbar spine measuring 4.2 cm in diameter, corresponding to the calcified aneurysmal sac in this patient status post abdominal aortic aneurysm repair with Dacron graft. Surgical clips projecting over the superior aspect of the right sacroiliac joint. IMPRESSION: 1. Nonspecific, nonobstructive bowel gas pattern. 2. No pneumoperitoneum. 3. Additional incidental findings, as above. Electronically Signed   By: Vinnie Langton M.D.   On: 07/13/2015 11:23   Dg Abd Acute W/chest  07/09/2015  CLINICAL DATA:  Nausea, vomiting last night. EXAM: DG ABDOMEN ACUTE W/ 1V CHEST COMPARISON:  Chest x-ray 06/26/2015 FINDINGS: Left dialysis catheter remains in place, unchanged. Interval removal of right internal jugular central line. Heart is normal size. Bibasilar opacities, likely atelectasis with small bilateral effusions. Nonobstructive bowel gas pattern. No free air organomegaly. No suspicious calcification. No acute bony abnormality. IMPRESSION: No evidence of bowel obstruction or free air. Bibasilar atelectasis, small effusions. Electronically Signed   By: Rolm Baptise M.D.   On: 07/09/2015 10:56

## 2015-08-01 NOTE — Patient Instructions (Signed)
Your physician recommends that you schedule a follow-up appointment in: 3 Months with Jory Sims, NP  Your physician recommends that you continue on your current medications as directed. Please refer to the Current Medication list given to you today.  If you need a refill on your cardiac medications before your next appointment, please call your pharmacy.  Thank you for choosing [place]!

## 2015-08-01 NOTE — Progress Notes (Signed)
Cardiology Office Note   Date:  08/01/2015   ID:  WEIR CROFF, DOB 1951/01/16, MRN [id number]  PCP:  Vic Blackbird, MD  Cardiologist: Cloria Spring, NP   Chief Complaint  Patient presents with  . PAD      History of Present Illness: [name] [name] is a 65 y.o. male who presents for ongoing assessment and management of atrial fibrillation, coronary artery disease, with drug-eluting stent to OM1, with chronic total occlusion of the right coronary artery, hypertension, peripheral arterial disease, chronic systolic heart failure, with most recent EF of 35-40%.  He was recently discharged from the hospital after aortic aneurysm repair (AAA), and left renal bypass,dated [id number], which was complicated by postop renal failure.  He was also readmitted one day after discharge for C. Difficile.he is followed by Dr. Ruta Hinds, VVS.   He comes today tired, but slowly improving.  He is using physical therapy at home, is walking with a walker, slowly getting his strength back.  He denies any chest pain, he is free of any frequency of diarrhea, or nausea, which occurred with C. Difficile.  He is tolerating his medications without side effects.his been placed back on Coumadin therapy due to atrial fibrillation, and CHADS VASC Score of 3.     Past Medical History  Diagnosis Date  . Arteriosclerotic cardiovascular disease (ASCVD)     AMI in 2000 treated at [facility]; cath in 12/2006->  Chronic total obstruction of the RCA;  drug-eluting stent placed in M1; inferior hypokinesis with an EF of 45%  . Tobacco abuse, in remission     20 pack years; quit in 2009  . Hypertension   . Chronic anticoagulation   . Permanent atrial fibrillation (HCC)     on coumadin for couple of years, stopped plavix at that time  . PVD (peripheral vascular disease) ([place])     Ct angiogram in 2009 revealed stable disease with 80% celiac stenosis,50% right renal artery ,ASCVD with ulceration in the abdominal  aortashe  . Nephrolithiasis   . Cerebrovascular disease 2002    carotid stent  . Hyperlipidemia   . Myocardial infarction ([place]) 10 yrs ago  . Testicular carcinoma ([place]) 1990    right orchiectomy  . AAA (abdominal aortic aneurysm) ([place]) 2017    3.3cm  . GERD (gastroesophageal reflux disease)   . Chronic combined systolic and diastolic CHF (congestive heart failure) [facility])     Past Surgical History  Procedure Laterality Date  . Appendectomy  2004  . Testicular cancer  1990    right orchiectomy  . Colonoscopy  06/17/2011    INCOMPLETE, PREP POOR. Procedure: COLONOSCOPY;  Surgeon: Daneil Dolin, MD;  [facility];  Service: Endoscopy;  Laterality: N/A;  10:00  . Esophagogastroduodenoscopy  06/17/2011    severe erosive/ulcerative reflux esophagitis, soft noncritical stricture dilatied, small hh, antral erosion   . Colonoscopy  07/15/2011    [id number] rectal and colon polyps  . Colonoscopy N/A 05/22/2015    Procedure: COLONOSCOPY;  Surgeon: Daneil Dolin, MD;  [facility];  Service: Endoscopy;  Laterality: N/A;  1300 - moved to 2:30 - office to notify  . Esophagogastroduodenoscopy N/A 05/22/2015    Procedure: ESOPHAGOGASTRODUODENOSCOPY (EGD);  Surgeon: Daneil Dolin, MD;  [facility];  Service: Endoscopy;  Laterality: N/A;  . Esophageal dilation N/A 05/22/2015    Procedure: ESOPHAGEAL DILATION;  Surgeon: Daneil Dolin, MD;  [facility];  Service: Endoscopy;  Laterality:  N/A;  . Peripheral vascular catheterization N/A 05/28/2015    Procedure: Abdominal Aortogram;  Surgeon: Serafina Mitchell, MD;  Location: [place] CV LAB;  Service: Cardiovascular;  Laterality: N/A;  . Abdominal aortic aneurysm repair N/A 06/09/2015    Procedure: ANEURYSM ABDOMINAL AORTIC REPAIR;  Surgeon: Elam Dutch, MD;  Location: [place];  Service: Vascular;  Laterality: N/A;  . Aortic/renal bypass Left 06/09/2015    Procedure: LEFT RENAL Artery BYPASS;  Surgeon: Elam Dutch, MD;  Location: [place];  Service: Vascular;  Laterality: Left;  . Aortic endarterecetomy N/A 06/09/2015    Procedure: AORTIC ENDARTERECETOMY;  Surgeon: Elam Dutch, MD;  Location: [place];  Service: Vascular;  Laterality: N/A;  . Insertion of dialysis catheter Left 06/26/2015    Procedure: INSERTION OF DIALYSIS CATHETER;  Surgeon: Angelia Mould, MD;  Location: [place];  Service: Vascular;  Laterality: Left;     Current Outpatient Prescriptions  Medication Sig Dispense Refill  . albuterol (PROVENTIL HFA;VENTOLIN HFA) 108 (90 BASE) MCG/ACT inhaler Inhale 2-4 puffs into the lungs every 4 (four) hours as needed for wheezing or shortness of breath. 1 Inhaler 0  . amLODipine (NORVASC) 10 MG tablet take 1 tablet by mouth once daily 90 tablet 3  . atorvastatin (LIPITOR) 20 MG tablet Take 1 tablet (20 mg total) by mouth at bedtime. 30 tablet 3  . diphenhydrAMINE (BENADRYL) 25 MG tablet Take 25 mg by mouth daily as needed for allergies.    . ferrous sulfate 325 (65 FE) MG tablet Take 1 tablet (325 mg total) by mouth 3 (three) times daily with meals. 90 tablet 3  . fexofenadine (ALLEGRA) 180 MG tablet Take 1 tablet (180 mg total) by mouth daily. 30 tablet 2  . lisinopril (PRINIVIL,ZESTRIL) 40 MG tablet Take 40 mg by mouth daily.   0  . metoprolol succinate (TOPROL-XL) 100 MG 24 hr tablet Take 1 tablet (100 mg total) by mouth daily. Take with or immediately following a meal. 90 tablet 3  . metroNIDAZOLE (FLAGYL) 500 MG tablet Take 1 tablet (500 mg total) by mouth 3 (three) times daily. 7 tablet 0  . Multiple Vitamin (ONE-A-DAY MENS PO) Take 1 tablet by mouth daily.    . nitroGLYCERIN (NITROSTAT) 0.4 MG SL tablet place 1 tablet under the tongue if needed every 5 minutes for chest pain for 3 doses IF NO RELIEF AFTER 3RD DOSE CALL PRESCRIBER OR 911. 25 tablet 2  . Nutritional Supplements (FEEDING SUPPLEMENT, NEPRO CARB STEADY,) LIQD Take 237 mLs by mouth 2 (two) times daily between meals.  0  .  Omega-3 Fatty Acids (FISH OIL) 1000 MG CAPS Take 1,000 mg by mouth daily.   0  . oxyCODONE-acetaminophen (PERCOCET/ROXICET) 5-325 MG tablet Take 1 tablet by mouth every 6 (six) hours as needed for moderate pain. 30 tablet 0  . pantoprazole (PROTONIX) 40 MG tablet Take 1 tablet (40 mg total) by mouth daily. 90 tablet 1  . sodium bicarbonate 650 MG tablet Take 1 tablet (650 mg total) by mouth 3 (three) times daily. 90 tablet 1  . tamsulosin (FLOMAX) 0.4 MG CAPS capsule Take 1 capsule (0.4 mg total) by mouth daily after supper. 30 capsule 2  . traZODone (DESYREL) 50 MG tablet Take 0.5-1 tablets (25-50 mg total) by mouth at bedtime as needed for sleep. (Patient taking differently: Take 50 mg by mouth at bedtime as needed for sleep. ) 30 tablet 3  . warfarin (COUMADIN) 5 MG tablet Take 1 1/2 tablets  daily (Patient taking differently: Take 5-7.5 mg by mouth daily. Take one tablet on all days except on Wednesdays and Thursdays take one and one-half tablets) 45 tablet 6   No current facility-administered medications for this visit.    Allergies:   Review of patient's allergies indicates no known allergies.    Social History:  The patient  reports that he quit smoking about 2 months ago. His smoking use included Cigarettes. He has a 4 pack-year smoking history. He has never used smokeless tobacco. He reports that he does not drink alcohol or use illicit drugs.   Family History:  The patient's family history includes Colon cancer (age of onset: 3) in his father; Prostate cancer in his father. There is no history of Liver disease, Anesthesia problems, Hypotension, Malignant hyperthermia, or Pseudochol deficiency.    ROS: All other systems are reviewed and negative. Unless otherwise mentioned in H&P    PHYSICAL EXAM: VS:  BP 140/80 mmHg  Pulse 77  Ht 6' (1.829 m)  Wt 165 lb (74.844 kg)  BMI 22.37 kg/m2  SpO2 98% , BMI Body mass index is 22.37 kg/(m^2). GEN: Well nourished, well developed, in no  acute distresspale. HEENT: normal Neck: no JVD, carotid bruits, or masses Cardiac: IRRR; no murmurs, rubs, or gallops,no edema  Respiratory:  clear to auscultation bilaterally, normal work of breathing GI: soft, nontender, nondistended, + BS MS: no deformity or atrophymidline sternotomy to the lower abdomen is clean and dry with out evidence of evisceration or infection. Skin: warm and dry, no rash Neuro:  Strength and sensation are intact Psych: euthymic mood, full affect   Recent Labs: 06/13/2015: TSH 1.030 07/13/2015: ALT 10*; Magnesium 2.2 07/25/2015: BUN 36*; Creatinine, Ser 2.90*; Hemoglobin 9.3*; Platelets 172; Potassium 3.3*; Sodium 134*    Lipid Panel    Component Value Date/Time   CHOL 141 11/07/2014 0804   TRIG 97 06/23/2015 0250   HDL 29* 11/07/2014 0804   CHOLHDL 4.9 11/07/2014 0804   VLDL 53* 11/07/2014 0804   LDLCALC 59 11/07/2014 0804      Wt Readings from Last 3 Encounters:  08/01/15 165 lb (74.844 kg)  07/25/15 163 lb (73.936 kg)  07/25/15 169 lb (76.658 kg)      ASSESSMENT AND PLAN:  1. Peripheral arterial disease: Status post triple a repair with left renal bypass.  He is doing well and improving slowly.  He offers no complaints of pain that he is having some fatigue.  The patient is undergoing physical therapy.  And continues to follow with vascular surgeons. Will need followup CBC on next appointment.  Due to anemia postoperatively during hospitalization.  2. Atrial fibrillation:heart rate is controlled.  He is continued on Coumadin therapy.  He is not currently on metoprolol for  rate control   3. Hypertension: blood pressure is well controlled currently.  Will not make any changes in his regimen at this time.  We will see him again in 6 months unless symptomatic Current medicines are reviewed at length with the patient today.    Labs/ tests ordered today include:  No orders of the defined types were placed in this encounter.     Disposition:    FU with 6 months unless symptomatic   Signed, Jory Sims, NP  08/01/2015 4:13 PM    [facility] [address] [place], [place] [address] Phone: [phone]; Fax: [phone]

## 2015-08-04 ENCOUNTER — Ambulatory Visit (INDEPENDENT_AMBULATORY_CARE_PROVIDER_SITE_OTHER): Payer: Medicare Other | Admitting: *Deleted

## 2015-08-04 ENCOUNTER — Telehealth: Payer: Self-pay

## 2015-08-04 DIAGNOSIS — Z95818 Presence of other cardiac implants and grafts: Secondary | ICD-10-CM | POA: Diagnosis not present

## 2015-08-04 DIAGNOSIS — Z5181 Encounter for therapeutic drug level monitoring: Secondary | ICD-10-CM

## 2015-08-04 DIAGNOSIS — Z48812 Encounter for surgical aftercare following surgery on the circulatory system: Secondary | ICD-10-CM | POA: Diagnosis not present

## 2015-08-04 DIAGNOSIS — I5042 Chronic combined systolic (congestive) and diastolic (congestive) heart failure: Secondary | ICD-10-CM | POA: Diagnosis not present

## 2015-08-04 DIAGNOSIS — I2510 Atherosclerotic heart disease of native coronary artery without angina pectoris: Secondary | ICD-10-CM | POA: Diagnosis not present

## 2015-08-04 DIAGNOSIS — Z8673 Personal history of transient ischemic attack (TIA), and cerebral infarction without residual deficits: Secondary | ICD-10-CM | POA: Diagnosis not present

## 2015-08-04 DIAGNOSIS — I4821 Permanent atrial fibrillation: Secondary | ICD-10-CM

## 2015-08-04 DIAGNOSIS — Z7951 Long term (current) use of inhaled steroids: Secondary | ICD-10-CM | POA: Diagnosis not present

## 2015-08-04 DIAGNOSIS — I739 Peripheral vascular disease, unspecified: Secondary | ICD-10-CM | POA: Diagnosis not present

## 2015-08-04 DIAGNOSIS — I482 Chronic atrial fibrillation: Secondary | ICD-10-CM | POA: Diagnosis not present

## 2015-08-04 DIAGNOSIS — I110 Hypertensive heart disease with heart failure: Secondary | ICD-10-CM | POA: Diagnosis not present

## 2015-08-04 DIAGNOSIS — Z7901 Long term (current) use of anticoagulants: Secondary | ICD-10-CM | POA: Diagnosis not present

## 2015-08-04 DIAGNOSIS — E785 Hyperlipidemia, unspecified: Secondary | ICD-10-CM | POA: Diagnosis not present

## 2015-08-04 LAB — POCT INR: INR: 4.3

## 2015-08-04 NOTE — Telephone Encounter (Signed)
Phone call from [facility] RN.  Reported pt. Has c/o constipation and nausea.  Reported the last "good BM" was approx. 1 week ago.  Reported the wife medicated the pt. with Ex-Lax 2 days ago, and the pt. Has been expelling very small pieces of hard stool, and passing gas.  Reported with assessment this AM, abdomen is soft, positive bowel sounds, and denies abdominal pain.  Stated the pt. Has not been very mobile.  Also has been taking Oxycodone for pain.  Advised to encourage increased mobility, increased oral fluid intake; also recommended to instruct pt. To take a Fleets enema, or a Dulcolax supp. Today.  Noted that pt. Is taking Iron supplement.  Advised to hold off on the Iron supplement today and tomorrow, until able to have adequate BM.  Pt. Is to follow-up in the office on Wed., 4/5, and can discuss with Dr. Oneida Alar, re: resuming the Iron.  Encouraged to call office if symptoms continue or worsen.  Nurse, Lawerance Bach, stated she will instruct the pt. And wife on the above.

## 2015-08-06 ENCOUNTER — Ambulatory Visit (INDEPENDENT_AMBULATORY_CARE_PROVIDER_SITE_OTHER): Payer: Medicare Other | Admitting: Vascular Surgery

## 2015-08-06 ENCOUNTER — Encounter: Payer: Self-pay | Admitting: Vascular Surgery

## 2015-08-06 ENCOUNTER — Telehealth: Payer: Self-pay | Admitting: Internal Medicine

## 2015-08-06 ENCOUNTER — Encounter: Payer: Self-pay | Admitting: *Deleted

## 2015-08-06 VITALS — BP 136/83 | HR 96 | Temp 97.1°F | Resp 18 | Ht 72.0 in | Wt 162.0 lb

## 2015-08-06 DIAGNOSIS — I714 Abdominal aortic aneurysm, without rupture, unspecified: Secondary | ICD-10-CM

## 2015-08-06 DIAGNOSIS — Z7901 Long term (current) use of anticoagulants: Secondary | ICD-10-CM | POA: Diagnosis not present

## 2015-08-06 DIAGNOSIS — I2510 Atherosclerotic heart disease of native coronary artery without angina pectoris: Secondary | ICD-10-CM | POA: Diagnosis not present

## 2015-08-06 DIAGNOSIS — E785 Hyperlipidemia, unspecified: Secondary | ICD-10-CM | POA: Diagnosis not present

## 2015-08-06 DIAGNOSIS — I5042 Chronic combined systolic (congestive) and diastolic (congestive) heart failure: Secondary | ICD-10-CM | POA: Diagnosis not present

## 2015-08-06 DIAGNOSIS — Z8673 Personal history of transient ischemic attack (TIA), and cerebral infarction without residual deficits: Secondary | ICD-10-CM | POA: Diagnosis not present

## 2015-08-06 DIAGNOSIS — I739 Peripheral vascular disease, unspecified: Secondary | ICD-10-CM | POA: Diagnosis not present

## 2015-08-06 DIAGNOSIS — Z7951 Long term (current) use of inhaled steroids: Secondary | ICD-10-CM | POA: Diagnosis not present

## 2015-08-06 DIAGNOSIS — Z48812 Encounter for surgical aftercare following surgery on the circulatory system: Secondary | ICD-10-CM | POA: Diagnosis not present

## 2015-08-06 DIAGNOSIS — I110 Hypertensive heart disease with heart failure: Secondary | ICD-10-CM | POA: Diagnosis not present

## 2015-08-06 DIAGNOSIS — Z95818 Presence of other cardiac implants and grafts: Secondary | ICD-10-CM | POA: Diagnosis not present

## 2015-08-06 DIAGNOSIS — I482 Chronic atrial fibrillation: Secondary | ICD-10-CM | POA: Diagnosis not present

## 2015-08-06 DIAGNOSIS — I701 Atherosclerosis of renal artery: Secondary | ICD-10-CM

## 2015-08-06 NOTE — Progress Notes (Signed)
Patient is a 65 year old male who returns for postoperative follow-up today. He recently underwent repair of abdominal aortic aneurysm and left renal artery bypass February 2017. He had a fairly complicated course with renal failure in the Hospital. He is now off dialysis. He really has no complaints. He is continuing to 80 gain weight and have return of bowel function. He has no significant abdominal pain and is off narcotics at this point.  Physical exam:  Filed Vitals:   08/06/15 1101  BP: 136/83  Pulse: 96  Temp: 97.1 F (36.2 C)  TempSrc: Oral  Resp: 18  Height: 6' (1.829 m)  Weight: 162 lb (73.483 kg)  SpO2: 99%    Abdomen: Well-healed midline laparotomy incision no evidence of hernia 2+ femoral pulses  Vascular: bilateral palpable pedal pulses  Assessment: Slowly improving status post abdominal aortic aneurysm repair with left renal artery bypass.  Plan: Patient will follow-up in 6 months time with a left renal artery duplex.  I also encouraged him today to continue his follow-up with her nephrologist outpatient.  Ruta Hinds, MD [facility] Office: [phone] Pager: [phone]

## 2015-08-06 NOTE — Telephone Encounter (Signed)
Pt's wife called and she is trying to find out the name of the Kidney doctor the patient goes to. I told her that if his PCP referred him to a kidney doctor that she needed to call them to see who it was.

## 2015-08-06 NOTE — Telephone Encounter (Signed)
noted 

## 2015-08-18 ENCOUNTER — Ambulatory Visit (INDEPENDENT_AMBULATORY_CARE_PROVIDER_SITE_OTHER): Payer: Medicare Other | Admitting: *Deleted

## 2015-08-18 DIAGNOSIS — I4821 Permanent atrial fibrillation: Secondary | ICD-10-CM

## 2015-08-18 DIAGNOSIS — Z5181 Encounter for therapeutic drug level monitoring: Secondary | ICD-10-CM | POA: Diagnosis not present

## 2015-08-18 DIAGNOSIS — I482 Chronic atrial fibrillation: Secondary | ICD-10-CM

## 2015-08-18 LAB — POCT INR: INR: 4.1

## 2015-08-19 ENCOUNTER — Ambulatory Visit (INDEPENDENT_AMBULATORY_CARE_PROVIDER_SITE_OTHER): Payer: Medicare Other | Admitting: Family Medicine

## 2015-08-19 ENCOUNTER — Encounter: Payer: Self-pay | Admitting: Family Medicine

## 2015-08-19 VITALS — BP 128/84 | HR 90 | Temp 97.7°F | Resp 18 | Ht 72.0 in | Wt 161.0 lb

## 2015-08-19 DIAGNOSIS — E46 Unspecified protein-calorie malnutrition: Secondary | ICD-10-CM | POA: Diagnosis not present

## 2015-08-19 DIAGNOSIS — L089 Local infection of the skin and subcutaneous tissue, unspecified: Secondary | ICD-10-CM

## 2015-08-19 DIAGNOSIS — A047 Enterocolitis due to Clostridium difficile: Secondary | ICD-10-CM

## 2015-08-19 DIAGNOSIS — G4700 Insomnia, unspecified: Secondary | ICD-10-CM

## 2015-08-19 DIAGNOSIS — N182 Chronic kidney disease, stage 2 (mild): Secondary | ICD-10-CM

## 2015-08-19 DIAGNOSIS — L729 Follicular cyst of the skin and subcutaneous tissue, unspecified: Secondary | ICD-10-CM | POA: Diagnosis not present

## 2015-08-19 DIAGNOSIS — A0472 Enterocolitis due to Clostridium difficile, not specified as recurrent: Secondary | ICD-10-CM

## 2015-08-19 MED ORDER — LORAZEPAM 0.5 MG PO TABS: 0.5000 mg | ORAL_TABLET | Freq: Two times a day (BID) | ORAL | Status: DC | PRN

## 2015-08-19 MED ORDER — ONDANSETRON 4 MG PO TBDP: 4.0000 mg | ORAL_TABLET | Freq: Three times a day (TID) | ORAL | Status: DC | PRN

## 2015-08-19 NOTE — Progress Notes (Signed)
Patient ID: [name] [name], male   DOB: 1950/12/15, 65 y.o.   MRN: [id number]    Subjective:    Patient ID: [name] [name], male    DOB: 02/28/1951, 65 y.o.   MRN: [id number]  Patient presents for Hospital F/U Patient for hospital follow-up. He is significantly prolonged hospitalization over the past 2-1/2 months. His hospitalization summarizes that he would for colonoscopy which revealed multiple polyps and polypectomies were done and he then had significant abdominal discomfort the following days afterwards he was seen in the emergency room multiple times. 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 However before they could do surgery he was found to be in A. fib with RVR this had to be corrected. He had surgery on his abdominal aortic aneurysm however he did have significant blood loss. He also had worsening renal failure and had to be set up on hemodialysis while hospitalized. He once again went into A. fib with RVR however he was having increased cardiac demand and his oxygenation was worsening. He has to be reintubated. He was also treated empirically for possible healthcare associated pneumonia. He was also treated with TPN. He did have Serratia Marcescens in his respiratory cultures. He also needed pressures for blood pressure support. By hospital day #6 he was extubated. By hospital day 19 he was able to urinate some and he was also eating well.  Unfortunately he was only out of the hospital for about 2 days before he will return for another extended admission. On the second admission on March 8 he presented with nausea vomiting there was concern for a local nonobstructing effect on the small bowel and a small lymphocele. He was also diagnosed with C. difficile colitis. His diet was slowly advanced. He was also still urinating on his own. He is still on Flagyl for the C. Difficile.  Since my  last visit with him which was back in December he has lost 40 pounds. His wife states that his appetite is still poor and he continued to have vomiting initially daily when he came in from the hospital now every other day. She stopped his oxycodone as they thought this was causing some constipation and bowel issues he is not in any significant pain. She also stop his iron supplement and his vomiting started to decrease. He has not had any emesis the past 2 days. He is actually quite fearful of eating because of all the complications that he has had. He tells me that his nerves are bad and that he can't sleep as he cannot relax. They're requesting something to help with his sleep and his nurse.   On exam is also noted that he had a good sized cyst on his right mid abdomen his wife states she got was a boil she been putting warm compresses on it and antibiotic ointment and has not had any significant drainage. This has been present for the past few weeks.  He has follow-up with nephrology in a couple weeks he is supposed to have labs drawn this week.   Review Of Systems:  GEN- denies fatigue, fever, +weight loss,+weakness, recent illness HEENT- denies eye drainage, change in vision, nasal discharge, CVS- denies chest pain, palpitations RESP- denies SOB, cough, wheeze ABD- denies N/V, change in stools, abd pain GU- denies dysuria, hematuria, dribbling, incontinence MSK- denies joint pain, muscle aches, injury Neuro- denies headache, dizziness, syncope, seizure activity  Objective:    BP 128/84 mmHg  Pulse 90  Temp(Src) 97.7 F (36.5 C) (Oral)  Resp 18  Ht 6' (1.829 m)  Wt 161 lb (73.029 kg)  BMI 21.83 kg/m2  SpO2 98% GEN- NAD, alert and oriented x3,weight loss 40lbs, walks with cane  HEENT- PERRL, EOMI, non injected sclera, pink conjunctiva, MMM, oropharynx clear Neck- Supple, no LAD CVS- irregular rhythem, normal rate , no murmur RESP-CTAB ABD-NABS,soft,mild TTP Right mid  abdomen- Grape size cyst, +flucutation,, ND, midline incision d/c/i  Psych- not anxious appearing, not depressed, no SI EXT- No edema Pulses- Radial  2+  Procedure- Incision and Drainage Procedure explained to patient questions answered benefits and risks discussed verbal  consent obtained. Antiseptic-Betadine Anesthesia-lidocaine 1% with Epi Incision performed large amount of pus expressed and cystic material, cyst shell removed Culture taken Minimal blood loss Patient tolerated procedure well Bandage applied        Assessment & Plan:      Problem List Items Addressed This Visit    Insomnia    A trial of Ativan 0.5 mg as needed short-term Also  completed his temporary  handicap form.      Chronic kidney disease    I will follow with his labs therapy to be done this week as well as his nephrologist.       Other Visit Diagnoses    Infected cyst of skin    -  Primary    Entire cyst removed through small icision, no packing needed. Currently on flagyl, culture sent but will hold off on further antibiotics     Relevant Orders    Wound culture    Enteritis due to Clostridium difficile        Complete flagyl, hold on Iron tablets, I dont think he can handle all the supplements and meds at this time    Malnutrition [facility])        Expected based on extended hospital course and complications, continue protein supplements- Boost/Ensure, activia       Note: This dictation was prepared with Dragon dictation along with smaller Company secretary. Any transcriptional errors that result from this process are unintentional.

## 2015-08-19 NOTE — Patient Instructions (Addendum)
Try ativan for sleep and nerves Zofran for nausea Continue with ensure and Boost Stool softener  F/U 1 month

## 2015-08-19 NOTE — Assessment & Plan Note (Signed)
A trial of Ativan 0.5 mg as needed short-term Also  completed his temporary  handicap form.

## 2015-08-19 NOTE — Assessment & Plan Note (Signed)
I will follow with his labs therapy to be done this week as well as his nephrologist.

## 2015-08-20 ENCOUNTER — Ambulatory Visit (INDEPENDENT_AMBULATORY_CARE_PROVIDER_SITE_OTHER): Payer: Medicare Other | Admitting: Gastroenterology

## 2015-08-20 ENCOUNTER — Encounter: Payer: Self-pay | Admitting: Gastroenterology

## 2015-08-20 VITALS — BP 139/77 | HR 75 | Temp 96.9°F | Ht 72.0 in | Wt 164.8 lb

## 2015-08-20 DIAGNOSIS — Z86010 Personal history of colonic polyps: Secondary | ICD-10-CM | POA: Diagnosis not present

## 2015-08-20 DIAGNOSIS — R110 Nausea: Secondary | ICD-10-CM | POA: Diagnosis not present

## 2015-08-20 DIAGNOSIS — K219 Gastro-esophageal reflux disease without esophagitis: Secondary | ICD-10-CM

## 2015-08-20 NOTE — Assessment & Plan Note (Signed)
Neck surveillance colonoscopy January 2020. Consider 2 day bowel prep.

## 2015-08-20 NOTE — Progress Notes (Signed)
Primary Care Physician: Vic Blackbird, MD  Primary Gastroenterologist:    Chief Complaint  Patient presents with  . Follow-up    Has alot of nausea since the procedure    HPI: [name] [name] is a 65 y.o. male here for nausea. Patient was seen back in January 2017 when he presented to schedule surveillance colonoscopy. He also had recurrent dysphagia to solid foods, heartburn. He underwent a colonoscopy and EGD on January 19. Multiple colonic and rectal polyps removed (tubular adenomas). Next colonoscopy planned for 3 years. EGD showed 2 cm hiatal hernia but otherwise normal-appearing EGD, status post empiric esophageal dilation.   Patient developed abdominal pain after his colonoscopy. He went to the emergency department and had a CT scan. 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  Patient underwent repair of juxtarenal abdominal aortic aneurysm, left renal artery bypass on 06/09/2015. Estimated blood loss of 4 L. Patient developed oliguria and required hemodialysis during his hospitalization. Course was complicated by A. fib, ileus, possible HCAP. TPN was started. Postop day 5 he did require reintubation in the setting of A. fib with RVR, volume overload/acute renal failure/CHF. He required pressors as well. Discharge for less than 2 days and was readmitted with nausea and vomiting, poor oral intake. CT without contrast on March 10  showed interval development of 3.9 x 5.2 x 3.2 circumflex comprised ovoid fluid collection at the junction of the small bowel wall in the small bowel mesentery in the mid abdomen just left of the midline in the region of the retroperitoneal operative field. She did thirst local mass effect on the small bowel but no obstructing. The dilation of the small bowel proximally. Likely incidental finding such as postoperative seroma or lymphocele. Diagnosed with C. difficile on March 12, completed Flagyl. Spent total of about 8 weeks in the hospital, discharged on 07/21/2015.  Weight is down about 35 pounds since before surgery. At this time he starting to feel somewhat better. Bowel movements are regular, twice daily. No blood in the stool or melena. Main complaints of nausea, dry heaves although states they're less frequent. Used to be multiple times per day but may go a couple of days without symptoms now. Has a prescription for Zofran provided yesterday by PCP but has not had it filled yet. Denies abdominal pain. Constipation resolved after stopping pain medication. No dysphagia. No melena.   Supposed to get labs with his nephrologist in the next week or 2. They will have results forwarded via fax to [place].   Current Outpatient Prescriptions  Medication Sig Dispense Refill  . albuterol (PROVENTIL HFA;VENTOLIN HFA) 108 (90 BASE) MCG/ACT inhaler Inhale 2-4 puffs into the lungs every 4 (four) hours as needed for wheezing or shortness of breath. 1 Inhaler 0  . amLODipine (NORVASC) 10 MG tablet take 1 tablet by mouth once daily 90 tablet 3  . atorvastatin (LIPITOR) 20 MG tablet Take 1 tablet (20 mg total) by mouth at bedtime. 30 tablet 3  . diphenhydrAMINE (  BENADRYL) 25 MG tablet Take 25 mg by mouth daily as needed for allergies.    . fexofenadine (ALLEGRA) 180 MG tablet Take 1 tablet (180 mg total) by mouth daily. 30 tablet 2  . lisinopril (PRINIVIL,ZESTRIL) 40 MG tablet Take 40 mg by mouth daily.   0  .  LORazepam (ATIVAN) 0.5 MG tablet Take 1 tablet (0.5 mg total) by mouth 2 (two) times daily as needed for anxiety. 45 tablet 1  . metoprolol succinate (TOPROL-XL) 100 MG 24 hr tablet Take 1 tablet (100 mg total) by mouth daily. Take with or immediately following a meal. 90 tablet 3  . Multiple Vitamin (ONE-A-DAY MENS PO) Take 1 tablet by mouth daily.    . Nutritional Supplements (FEEDING SUPPLEMENT, NEPRO CARB STEADY,) LIQD Take 237 mLs by mouth 2 (two) times daily between meals.  0  . Omega-3 Fatty Acids (FISH OIL) 1000 MG CAPS Take 1,000 mg by mouth daily.   0  . ondansetron (ZOFRAN ODT) 4 MG disintegrating tablet Take 1 tablet (4 mg total) by mouth every 8 (eight) hours as needed for nausea or vomiting. 20 tablet 0  . pantoprazole (PROTONIX) 40 MG tablet Take 1 tablet (40 mg total) by mouth daily. 90 tablet 1  . sodium bicarbonate 650 MG tablet Take 1 tablet (650 mg total) by mouth 3 (three) times daily. 90 tablet 1  . tamsulosin (FLOMAX) 0.4 MG CAPS capsule Take 1 capsule (0.4 mg total) by mouth daily after supper. 30 capsule 2  . traZODone (DESYREL) 50 MG tablet Take 0.5-1 tablets (25-50 mg total) by mouth at bedtime as needed for sleep. (Patient taking differently: Take 50 mg by mouth at bedtime as needed for sleep. ) 30 tablet 3  . warfarin (COUMADIN) 5 MG tablet Take 1 1/2 tablets daily (Patient taking differently: Take 5-7.5 mg by mouth daily. 5mg  MWFSat  7.5mg  TThSun) 45 tablet 6  . nitroGLYCERIN (NITROSTAT) 0.4 MG SL tablet place 1 tablet under the tongue if needed every 5 minutes for chest pain for 3 doses IF NO RELIEF AFTER 3RD DOSE CALL PRESCRIBER OR 911. (Patient not taking: Reported on 08/20/2015) 25 tablet 2   No current facility-administered medications for this visit.    Allergies as of 08/20/2015  . (No Known Allergies)    ROS:  General: Negative for Fever. Weakness/fatigue slowly improving. Anorexia slowly improving.  ENT: Negative for hoarseness, difficulty swallowing ,  nasal congestion. CV: Negative for chest pain, angina, palpitations, dyspnea on exertion, peripheral edema.  Respiratory: Negative for dyspnea at rest, dyspnea on exertion, cough, sputum, wheezing.  GI: See history of present illness. GU:  Negative for dysuria, hematuria, urinary incontinence, urinary frequency, nocturnal urination.  Endo: See history of present illness.     Physical Examination:   BP 139/77 mmHg  Pulse 75  Temp(Src) 96.9 F (36.1 C) (Oral)  Ht 6' (1.829 m)  Wt 164 lb 12.8 oz (74.753 kg)  BMI 22.35 kg/m2  General: Chronically ill-appearing male in no acute distress. Eyes: No icterus. Mouth: Oropharyngeal mucosa moist and pink , no lesions erythema or exudate. Lungs: Clear to auscultation bilaterally.  Heart: Regular rate and rhythm, no murmurs rubs or gallops.  Abdomen: Bowel sounds are normal, nontender, well-healed midline incision, nondistended, no hepatosplenomegaly or masses, no abdominal bruits or hernia , no rebound or guarding.   Extremities: No lower extremity edema. No clubbing or deformities. Neuro: Alert and oriented x 4   Skin: Warm and dry, no jaundice.   Psych: Alert and  cooperative, normal mood and affect.  Labs:  Lab Results  Component Value Date   WBC 9.3 07/25/2015   HGB 9.3* 07/25/2015   HCT 28.0* 07/25/2015   MCV 85.1 07/25/2015   PLT 172 07/25/2015   Lab Results  Component Value Date   CREATININE 2.90* 07/25/2015   BUN 36* 07/25/2015   NA 134* 07/25/2015   K 3.3* 07/25/2015   CL 102 07/25/2015   CO2 23 07/25/2015   Lab Results  Component Value Date   ALT 10* 07/13/2015   AST 11* 07/13/2015   ALKPHOS 82 07/13/2015   BILITOT 0.7 07/13/2015    Imaging Studies: Dg Chest 2 View  07/25/2015  CLINICAL DATA:  Patient was released from hospital on Monday after an aneurism repair. Patient started complaining of chest pain with left arm pain starting today. htn controlled by meds smoker x 40 yrs EXAM: CHEST  2 VIEW COMPARISON:   07/09/2015 FINDINGS: Stable mildly enlarged cardiac silhouette. No evidence of effusion, infiltrate, or pneumothorax. Nipple shadows noted. IMPRESSION: No acute cardiopulmonary process. Electronically Signed   By: Suzy Bouchard M.D.   On: 07/25/2015 20:46

## 2015-08-20 NOTE — Assessment & Plan Note (Signed)
Complicated postoperative course including renal failure requiring hemodialysis, C. difficile as outlined above. Nausea appears to be improving at this point. Total of 35 pound weight loss since February. At this point in time, would consider symptomatic measures in the way of Zofran. He can utilize when necessary as his symptoms are not daily at this point. However a discussed with him that if he has frequent symptoms on a daily basis, with considered scheduled Zofran 2-3 times daily and we can write him a new prescription for that. He will let us know how he is doing over the next couple of weeks.  He will have his labs forwarded to [place] that are planned in the next couple weeks by his nephrologist. Further recommendations to follow.

## 2015-08-20 NOTE — Patient Instructions (Signed)
1. Please have your doctors fax to our office at [phone]. 2. I suspect her nausea will continue to improve. Use Zofran as needed for now. If you find that you need to use it more frequently, give me a call and I'll give you new prescription. You can use Zofran 4 mg every 6 hours if needed. 3. Please call if ongoing nausea.

## 2015-08-20 NOTE — Assessment & Plan Note (Signed)
GERD well controlled. No issues with dysphagia status post dilation. Continue PPI therapy. Continue anti-reflex measures.

## 2015-08-21 NOTE — Progress Notes (Signed)
cc'ed to pcp °

## 2015-08-22 LAB — WOUND CULTURE
Gram Stain: NONE SEEN
Organism ID, Bacteria: NORMAL

## 2015-08-27 NOTE — Addendum Note (Signed)
Addended by: Mena Goes on: 08/27/2015 05:18 PM   Modules accepted: Orders

## 2015-09-01 ENCOUNTER — Ambulatory Visit (INDEPENDENT_AMBULATORY_CARE_PROVIDER_SITE_OTHER): Payer: Medicare Other | Admitting: *Deleted

## 2015-09-01 ENCOUNTER — Telehealth: Payer: Self-pay

## 2015-09-01 DIAGNOSIS — Z5181 Encounter for therapeutic drug level monitoring: Secondary | ICD-10-CM

## 2015-09-01 DIAGNOSIS — I4821 Permanent atrial fibrillation: Secondary | ICD-10-CM

## 2015-09-01 DIAGNOSIS — I482 Chronic atrial fibrillation: Secondary | ICD-10-CM

## 2015-09-01 LAB — POCT INR: INR: 2.3

## 2015-09-01 NOTE — Telephone Encounter (Signed)
Pt called and wanted to let LSL know that he was still having nausea but not quite as bad. It seems to come and go. He is taking the zofran and it is helping some.  He vomits at times, it depends on what he eats. He ate a hot dog and vomited it up but ate a sub last night and this morning and was able to keep it down without any problems. No fever. He is keeping liquids down without any problems.

## 2015-09-02 NOTE — Telephone Encounter (Signed)
Recommend avoiding greasy/fatty foods.  Is his weight stable? Has he had his blood work with his neurologist, and if so we need a copy.

## 2015-09-03 ENCOUNTER — Other Ambulatory Visit: Payer: Self-pay | Admitting: *Deleted

## 2015-09-03 MED ORDER — ATORVASTATIN CALCIUM 20 MG PO TABS: 20.0000 mg | ORAL_TABLET | Freq: Every day | ORAL | Status: DC

## 2015-09-03 MED ORDER — SODIUM BICARBONATE 650 MG PO TABS: 650.0000 mg | ORAL_TABLET | Freq: Three times a day (TID) | ORAL | Status: DC

## 2015-09-03 NOTE — Telephone Encounter (Signed)
Received fax requesting refill on Lipitor and sodium Bicarbonate.   Refill appropriate and filled per protocol.

## 2015-09-04 NOTE — Telephone Encounter (Signed)
Pt said his weight is staying about the same. He said he feels like his weight is about the same. He also said he doesn't really eat anything fried or greasy. He can eat fruit and throw it up.   He has not had his blood work done yet, he said when he does he will have them send [place] a copy.

## 2015-09-04 NOTE — Telephone Encounter (Signed)
Recommend using Zofran 4mg  prior to meals TID. I would offer CT A/P with oral contrast only (no IV contrast) for ongoing nausea/vomiting, reevaluate fluid collection at small bowel wall and small bowel mesentery seen on prior CT,

## 2015-09-08 NOTE — Telephone Encounter (Signed)
Tried to call pt- NA- no voicemail.if he calls back, can you schedule him please.

## 2015-09-10 ENCOUNTER — Other Ambulatory Visit: Payer: Self-pay

## 2015-09-10 DIAGNOSIS — R112 Nausea with vomiting, unspecified: Secondary | ICD-10-CM

## 2015-09-10 NOTE — Telephone Encounter (Signed)
Pt is aware. Ok to set up CT. Please schedule.

## 2015-09-10 NOTE — Telephone Encounter (Signed)
Pt is set up for CT on 09/15/15 @ 1030 am. He is aware of date and time. NO PA was needed

## 2015-09-15 ENCOUNTER — Ambulatory Visit (HOSPITAL_COMMUNITY)
Admission: RE | Admit: 2015-09-15 | Discharge: 2015-09-15 | Disposition: A | Discharge status: Home / Self Care | Payer: Medicare Other | Source: Ambulatory Visit | Attending: Gastroenterology | Admitting: Gastroenterology

## 2015-09-15 DIAGNOSIS — R112 Nausea with vomiting, unspecified: Secondary | ICD-10-CM

## 2015-09-15 DIAGNOSIS — R109 Unspecified abdominal pain: Secondary | ICD-10-CM | POA: Diagnosis not present

## 2015-09-16 NOTE — Progress Notes (Signed)
Quick Note:  1. Please let patient know that his CT failed to show any cause of ongoing persistent nausea. The post surgical fluid collection within the mesentery that was there last time causing compression of the small bowel has now resolved. Continue Zofran as before.  2. OV with RMR only in 4-6 weeks.  3. He has persistent left perirenal fat stranding of unclear significance. We need to send a copy of the CT report to his nephrologist and make contact with nephrologist's nurse. He also has left adrenal nodule which is stable, possibly adenoma based on prior imaging which he should follow with his nephrologist as well. 4. I will forward copy of this CT report to Dr. Ruta Hinds for [facility]. Patient should continue follow up with Dr. Oneida Alar as already planned. 5. Need copy of last labs done by nephrologist. ______

## 2015-09-17 ENCOUNTER — Encounter: Payer: Self-pay | Admitting: Internal Medicine

## 2015-09-19 ENCOUNTER — Encounter: Payer: Self-pay | Admitting: Family Medicine

## 2015-09-19 ENCOUNTER — Ambulatory Visit (INDEPENDENT_AMBULATORY_CARE_PROVIDER_SITE_OTHER): Payer: Medicare Other | Admitting: Family Medicine

## 2015-09-19 VITALS — BP 148/80 | HR 80 | Temp 98.8°F | Resp 16 | Ht 72.0 in | Wt 168.0 lb

## 2015-09-19 DIAGNOSIS — E46 Unspecified protein-calorie malnutrition: Secondary | ICD-10-CM | POA: Diagnosis not present

## 2015-09-19 DIAGNOSIS — R110 Nausea: Secondary | ICD-10-CM | POA: Diagnosis not present

## 2015-09-19 DIAGNOSIS — I10 Essential (primary) hypertension: Secondary | ICD-10-CM | POA: Diagnosis not present

## 2015-09-19 DIAGNOSIS — G4700 Insomnia, unspecified: Secondary | ICD-10-CM | POA: Diagnosis not present

## 2015-09-19 MED ORDER — TRAZODONE HCL 50 MG PO TABS: 50.0000 mg | ORAL_TABLET | Freq: Every evening | ORAL | Status: DC | PRN

## 2015-09-19 NOTE — Assessment & Plan Note (Signed)
Bp a little elevated today, will have them monitor at home, has been well controlled with meds Nephrology to do labs next week

## 2015-09-19 NOTE — Progress Notes (Signed)
Patient ID: [name] [name], male   DOB: 03-05-1951, 65 y.o.   MRN: [id number]   Subjective:    Patient ID: [name] [name], male    DOB: 06/14/1950, 65 y.o.   MRN: [id number]  Patient presents for 4 week F/U Seen here for interim follow-up since his hospitalization. He was seen on the 18th for his hospital follow-up at that time he had malnutrition secondary to poor appetite in C. difficile Colitis. He had a prolonged hospitalization secondary to atrial fibrillation with RVR and PNA.    At the last visit he was treated for infected cyst of his skin I performed an incision and drainage on this  He was also started on Ativan 0.5 mg as needed for sleep and his nerves, but this did not help much, he also ran out of trazodone and did not realize this  For weight loss he was on Zofran for nausea was continued on protein supplements in the form of boost  - today weight up 7lbs  Review Of Systems:  GEN- denies fatigue, fever, weight loss,weakness, recent illness HEENT- denies eye drainage, change in vision, nasal discharge, CVS- denies chest pain, palpitations RESP- denies SOB, cough, wheeze ABD- denies N/V, change in stools, abd pain GU- denies dysuria, hematuria, dribbling, incontinence MSK- denies joint pain, muscle aches, injury Neuro- denies headache, dizziness, syncope, seizure activity       Objective:    BP 148/80 mmHg  Pulse 80  Temp(Src) 98.8 F (37.1 C) (Oral)  Resp 16  Ht 6' (1.829 m)  Wt 168 lb (76.204 kg)  BMI 22.78 kg/m2 GEN- NAD, alert and oriented x3,weight gain 7lbs HEENT- PERRL, EOMI, non injected sclera, pink conjunctiva, MMM, oropharynx clear Neck- Supple, no thyromegaly CVS- irregular rhythem, normal rate no murmur RESP-CTAB ABD-NABS,soft,NT,ND, cyst I and D site, well healed ,NT  EXT- No edema Pulses- Radial  2+        Assessment & Plan:      Problem List Items Addressed This Visit    Protein-calorie malnutrition (HCC)   Nausea without vomiting -  Primary    Now resolved, weight also improved, continue to slowly increase his diet      Insomnia    Restart trazadone 50mg  at bedtime      Essential hypertension (Chronic)    Bp a little elevated today, will have them monitor at home, has been well controlled with meds Nephrology to do labs next week         Note: This dictation was prepared with Dragon dictation along with smaller phrase technology. Any transcriptional errors that result from this process are unintentional.

## 2015-09-19 NOTE — Assessment & Plan Note (Signed)
Restart trazadone 50mg  at bedtime

## 2015-09-19 NOTE — Patient Instructions (Signed)
Restart the trazodone at bedtime Weight up 7lbs F/U 3 months

## 2015-09-19 NOTE — Assessment & Plan Note (Signed)
Now resolved, weight also improved, continue to slowly increase his diet

## 2015-09-22 ENCOUNTER — Ambulatory Visit (INDEPENDENT_AMBULATORY_CARE_PROVIDER_SITE_OTHER): Payer: Medicare Other | Admitting: *Deleted

## 2015-09-22 DIAGNOSIS — Z5181 Encounter for therapeutic drug level monitoring: Secondary | ICD-10-CM | POA: Diagnosis not present

## 2015-09-22 DIAGNOSIS — I4821 Permanent atrial fibrillation: Secondary | ICD-10-CM

## 2015-09-22 DIAGNOSIS — I482 Chronic atrial fibrillation: Secondary | ICD-10-CM | POA: Diagnosis not present

## 2015-09-22 LAB — POCT INR: INR: 2.2

## 2015-09-24 DIAGNOSIS — N183 Chronic kidney disease, stage 3 (moderate): Secondary | ICD-10-CM | POA: Diagnosis not present

## 2015-09-24 DIAGNOSIS — E785 Hyperlipidemia, unspecified: Secondary | ICD-10-CM | POA: Diagnosis not present

## 2015-09-24 DIAGNOSIS — N179 Acute kidney failure, unspecified: Secondary | ICD-10-CM | POA: Diagnosis not present

## 2015-09-24 DIAGNOSIS — I129 Hypertensive chronic kidney disease with stage 1 through stage 4 chronic kidney disease, or unspecified chronic kidney disease: Secondary | ICD-10-CM | POA: Diagnosis not present

## 2015-10-09 ENCOUNTER — Other Ambulatory Visit: Payer: Self-pay | Admitting: Family Medicine

## 2015-10-09 ENCOUNTER — Other Ambulatory Visit: Payer: Self-pay | Admitting: Vascular Surgery

## 2015-10-09 NOTE — Telephone Encounter (Signed)
Refill appropriate and filled per protocol. 

## 2015-10-13 DIAGNOSIS — N183 Chronic kidney disease, stage 3 (moderate): Secondary | ICD-10-CM | POA: Diagnosis not present

## 2015-10-17 ENCOUNTER — Other Ambulatory Visit: Payer: Self-pay | Admitting: Family Medicine

## 2015-10-20 ENCOUNTER — Ambulatory Visit (INDEPENDENT_AMBULATORY_CARE_PROVIDER_SITE_OTHER): Payer: Medicare Other | Admitting: *Deleted

## 2015-10-20 DIAGNOSIS — I482 Chronic atrial fibrillation: Secondary | ICD-10-CM | POA: Diagnosis not present

## 2015-10-20 DIAGNOSIS — Z5181 Encounter for therapeutic drug level monitoring: Secondary | ICD-10-CM | POA: Diagnosis not present

## 2015-10-20 DIAGNOSIS — I4821 Permanent atrial fibrillation: Secondary | ICD-10-CM

## 2015-10-20 LAB — POCT INR: INR: 1.6

## 2015-10-21 ENCOUNTER — Encounter: Payer: Self-pay | Admitting: Internal Medicine

## 2015-10-21 ENCOUNTER — Ambulatory Visit (INDEPENDENT_AMBULATORY_CARE_PROVIDER_SITE_OTHER): Payer: Medicare Other | Admitting: Internal Medicine

## 2015-10-21 VITALS — BP 140/83 | HR 76 | Temp 97.5°F | Ht 72.0 in | Wt 177.0 lb

## 2015-10-21 DIAGNOSIS — R195 Other fecal abnormalities: Secondary | ICD-10-CM | POA: Diagnosis not present

## 2015-10-21 DIAGNOSIS — K921 Melena: Secondary | ICD-10-CM

## 2015-10-21 LAB — HEMOGLOBIN: Hemoglobin: 12.3 g/dL — ABNORMAL LOW (ref 13.2–17.1)

## 2015-10-21 LAB — HEMATOCRIT: HCT: 36 % — ABNORMAL LOW (ref 38.5–50.0)

## 2015-10-21 NOTE — Telephone Encounter (Signed)
rx called in

## 2015-10-21 NOTE — Patient Instructions (Addendum)
Continue Protonix 40 mg daily  GERD information provided  Hemoglobin and hematocrit today  Further recommendations to follow  Office visit with [place] in one year.

## 2015-10-21 NOTE — Progress Notes (Signed)
Primary Care Physician:  Vic Blackbird, MD Primary Gastroenterologist:  Dr. Gala Romney  Pre-Procedure History & Physical: HPI:  [name] [name] is a 65 y.o. male here for followup of GERD, nausea and vomiting. After undergoing EGD colonoscopy earlier this year patient underwent repair of a AAA as well as revascularization of the kidney. Had a stormy postoperative course. Course complicated by C. Difficile infection along with a protracted episode of nausea and weight loss. Today he states he's back to baseline feels good, no further nausea or vomiting. He's gained 13 pounds since his last office visit here 2 months ago. His reflux symptoms well controlled on Protonix 40 mg daily. He is no longer taking anything for nausea or vomiting.  He does not he's had some black stools over the past week. He ate some collard greens recently. No Pepto-Bismol. No gross blood correct. Hemoglobin back in May done by his nephrologist demonstrated an H&H of 12.6 and 36.3 he remains on Coumadin for atrial fibrillation. Given history of colonic polyps he is due for surveillance examination in about 2-1/2 years.  Past Medical History  Diagnosis Date  . Arteriosclerotic cardiovascular disease (ASCVD)     AMI in 2000 treated at [facility]; cath in 12/2006->  Chronic total obstruction of the RCA;  drug-eluting stent placed in M1; inferior hypokinesis with an EF of 45%  . Tobacco abuse, in remission     20 pack years; quit in 2009  . Hypertension   . Chronic anticoagulation   . Permanent atrial fibrillation (HCC)     on coumadin for couple of years, stopped plavix at that time  . PVD (peripheral vascular disease) ([place])     Ct angiogram in 2009 revealed stable disease with 80% celiac stenosis,50% right renal artery ,ASCVD with ulceration in the abdominal aortashe  . Nephrolithiasis   . Cerebrovascular disease 2002    carotid stent  . Hyperlipidemia   . Myocardial infarction ([place]) 10 yrs ago  . Testicular carcinoma  ([place]) 1990    right orchiectomy  . AAA (abdominal aortic aneurysm) ([place]) 2017    3.3cm  . GERD (gastroesophageal reflux disease)   . Chronic combined systolic and diastolic CHF (congestive heart failure) [facility])     Past Surgical History  Procedure Laterality Date  . Appendectomy  2004  . Testicular cancer  1990    right orchiectomy  . Colonoscopy  06/17/2011    INCOMPLETE, PREP POOR. Procedure: COLONOSCOPY;  Surgeon: Daneil Dolin, MD;  [facility];  Service: Endoscopy;  Laterality: N/A;  10:00  . Esophagogastroduodenoscopy  06/17/2011    severe erosive/ulcerative reflux esophagitis, soft noncritical stricture dilatied, small hh, antral erosion   . Colonoscopy  07/15/2011    [id number] rectal and colon polyps  . Colonoscopy N/A 05/22/2015    [id number] colonic and rectal polyps. tubular adenomas.repeat TCS 05/2018  . Esophagogastroduodenoscopy N/A 05/22/2015    RMR: 2 cm HH otherwise normal. s/p empirical dilation.  . Esophageal dilation N/A 05/22/2015    Procedure: ESOPHAGEAL DILATION;  Surgeon: Daneil Dolin, MD;  [facility];  Service: Endoscopy;  Laterality: N/A;  . Peripheral vascular catheterization N/A 05/28/2015    Procedure: Abdominal Aortogram;  Surgeon: Serafina Mitchell, MD;  Location: [place] CV LAB;  Service: Cardiovascular;  Laterality: N/A;  . Abdominal aortic aneurysm repair N/A 06/09/2015    Procedure: ANEURYSM ABDOMINAL AORTIC REPAIR;  Surgeon: Elam Dutch, MD;  Location: [place];  Service: Vascular;  Laterality: N/A;  .  Aortic/renal bypass Left 06/09/2015    Procedure: LEFT RENAL Artery BYPASS;  Surgeon: Elam Dutch, MD;  Location: [place];  Service: Vascular;  Laterality: Left;  . Aortic endarterecetomy N/A 06/09/2015    Procedure: AORTIC ENDARTERECETOMY;  Surgeon: Elam Dutch, MD;  Location: [place];  Service: Vascular;  Laterality: N/A;  . Insertion of dialysis catheter Left 06/26/2015    Procedure: INSERTION OF DIALYSIS CATHETER;   Surgeon: Angelia Mould, MD;  Location: [place];  Service: Vascular;  Laterality: Left;    Prior to Admission medications   Medication Sig Start Date End Date Taking? Authorizing Provider  albuterol (PROVENTIL HFA;VENTOLIN HFA) 108 (90 BASE) MCG/ACT inhaler Inhale 2-4 puffs into the lungs every 4 (four) hours as needed for wheezing or shortness of breath. 04/10/15  Yes Kristen N Ward, DO  amLODipine (NORVASC) 10 MG tablet take 1 tablet by mouth once daily 07/16/14  Yes Alycia Rossetti, MD  atorvastatin (LIPITOR) 20 MG tablet Take 1 tablet (20 mg total) by mouth at bedtime. 09/03/15  Yes Alycia Rossetti, MD  diphenhydrAMINE (BENADRYL) 25 MG tablet Take 25 mg by mouth daily as needed for allergies.   Yes Historical Provider, MD  fexofenadine (ALLEGRA) 180 MG tablet Take 1 tablet (180 mg total) by mouth daily. 07/01/11  Yes Alycia Rossetti, MD  lisinopril (PRINIVIL,ZESTRIL) 40 MG tablet Take 40 mg by mouth daily.  05/05/15  Yes Historical Provider, MD  LORazepam (ATIVAN) 0.5 MG tablet TAKE 1 TABLET BY MOUTH TWICE DAILY AS NEEDED FOR ANXIETY 10/21/15  Yes Alycia Rossetti, MD  metoprolol succinate (TOPROL-XL) 100 MG 24 hr tablet Take 1 tablet (100 mg total) by mouth daily. Take with or immediately following a meal. 07/24/15  Yes Arnoldo Lenis, MD  Multiple Vitamin (ONE-A-DAY MENS PO) Take 1 tablet by mouth daily.   Yes Historical Provider, MD  nitroGLYCERIN (NITROSTAT) 0.4 MG SL tablet place 1 tablet under the tongue if needed every 5 minutes for chest pain for 3 doses IF NO RELIEF AFTER 3RD DOSE CALL PRESCRIBER OR 911. 12/16/14  Yes Lendon Colonel, NP  Nutritional Supplements (FEEDING SUPPLEMENT, NEPRO CARB STEADY,) LIQD Take 237 mLs by mouth 2 (two) times daily between meals. 07/07/15  Yes Samantha J Rhyne, PA-C  Omega-3 Fatty Acids (FISH OIL) 1000 MG CAPS Take 1,000 mg by mouth daily.  11/12/11  Yes Yehuda Savannah, MD  ondansetron (ZOFRAN ODT) 4 MG disintegrating tablet Take 1 tablet (4 mg  total) by mouth every 8 (eight) hours as needed for nausea or vomiting. 08/19/15  Yes Alycia Rossetti, MD  pantoprazole (PROTONIX) 40 MG tablet Take 1 tablet (40 mg total) by mouth daily. 07/08/15  Yes Alycia Rossetti, MD  sodium bicarbonate 650 MG tablet Take 1 tablet (650 mg total) by mouth 3 (three) times daily. 09/03/15  Yes Alycia Rossetti, MD  tamsulosin (FLOMAX) 0.4 MG CAPS capsule TAKE 1 CAPSULE BY MOUTH EVERY DAY AFTER SUPPER 10/09/15  Yes Alycia Rossetti, MD  traZODone (DESYREL) 50 MG tablet Take 1 tablet (50 mg total) by mouth at bedtime as needed for sleep. 09/19/15  Yes Alycia Rossetti, MD  warfarin (COUMADIN) 5 MG tablet Take 1 1/2 tablets daily Patient taking differently: Take 5-7.5 mg by mouth daily. 5mg  MWFSat  7.5mg  TThSun 11/07/14  Yes Lendon Colonel, NP    Allergies as of 10/21/2015  . (No Known Allergies)    Family History  Problem Relation Age of Onset  .  Colon cancer Father 34    deceased  . Prostate cancer Father   . Liver disease Neg Hx   . Anesthesia problems Neg Hx   . Hypotension Neg Hx   . Malignant hyperthermia Neg Hx   . Pseudochol deficiency Neg Hx     Social History   Social History  . Marital Status: Married    Spouse Name: N/A  . Number of Children: 2  . Years of Education: N/A   Occupational History  . disabled    Social History Main Topics  . Smoking status: Former Smoker -- 0.10 packs/day for 40 years    Types: Cigarettes    Quit date: 05/24/2015  . Smokeless tobacco: Never Used     Comment: 1/2 pack daily  . Alcohol Use: No  . Drug Use: No  . Sexual Activity: Yes    Birth Control/ Protection: None   Other Topics Concern  . Not on file   Social History Narrative    Review of Systems: See HPI, otherwise negative ROS  Physical Exam: BP 140/83 mmHg  Pulse 76  Temp(Src) 97.5 F (36.4 C)  Ht 6' (1.829 m)  Wt 177 lb (80.287 kg)  BMI 24.00 kg/m2 General:   Alert,  Well-developed, well-nourished, pleasant and cooperative in  NAD Skin:  Intact without significant lesions or rashes. Neck:  Supple; no masses or thyromegaly. No significant cervical adenopathy. Lungs:  Clear throughout to auscultation.   No wheezes, crackles, or rhonchi. No acute distress. Heart:  Regular rate and rhythm; no murmurs, clicks, rubs,  or gallops. Abdomen: nondistended. Patient has a long, well healing vertical laparotomy scar.Non-distended, normal bowel sounds.  Soft and nontender without appreciable mass or hepatosplenomegaly.  Pulses:  Normal pulses noted. Extremities:  Without clubbing or edema. Rectal:  The sphincter time. No mass rectal vault. Scant brown stools Hemoccult negative   Impression:  65 year old gentleman recent repair of a AAA and revascularization of his left kidney. A stormy postoperative course complicated by Clostridium difficile infection and prolonged postoperative nausea. Intra-abdominal fluid collection resolved on follow-up CT. Clinically, doing very well at this time. History of colonic adenomas removed;   due for surveillance in 2 and half years. GERD symptoms well controlled on Protonix 40 mg daily  Recent history of dark stools likely due to dietary indiscretion and not bleeding. Hemoccult negative today.    Recommendations:   Continue Protonix 40 mg daily  GERD information provided  Hemoglobin and hematocrit today  Further recommendations to follow  Office visit with [place] in one year.  Surveillance colonoscopy 2-1/2 years from now        Notice: This dictation was prepared with Dragon dictation along with smaller phrase technology. Any transcriptional errors that result from this process are unintentional and may not be corrected upon review.

## 2015-10-21 NOTE — Telephone Encounter (Signed)
okay

## 2015-10-21 NOTE — Telephone Encounter (Signed)
LRF 08/19/15 #45 + 1  LOV 09/19/15  OK refill?

## 2015-10-22 ENCOUNTER — Other Ambulatory Visit: Payer: Self-pay | Admitting: *Deleted

## 2015-10-22 MED ORDER — AMLODIPINE BESYLATE 10 MG PO TABS: ORAL_TABLET | ORAL | Status: DC

## 2015-10-22 NOTE — Telephone Encounter (Signed)
Received fax requesting refill on Norvasc.   Refill appropriate and filled per protocol.  

## 2015-10-23 ENCOUNTER — Other Ambulatory Visit: Payer: Self-pay | Admitting: Internal Medicine

## 2015-10-23 DIAGNOSIS — D649 Anemia, unspecified: Secondary | ICD-10-CM

## 2015-10-31 ENCOUNTER — Encounter: Payer: Self-pay | Admitting: Adult Health

## 2015-10-31 ENCOUNTER — Encounter: Payer: Medicare Other | Admitting: Adult Health

## 2015-10-31 NOTE — Progress Notes (Signed)
Cardiology Office Note   Date:  10/31/2015   ID:  DEMEATRICE KADIR, DOB August 24, 1950, MRN [id number]  PCP:  Vic Blackbird, MD  Cardiologist: Cloria Spring, NP   No chief complaint on file.   ERROR No Show

## 2015-11-04 ENCOUNTER — Encounter: Payer: Self-pay | Admitting: Physician Assistant

## 2015-11-04 NOTE — Progress Notes (Deleted)
cx

## 2015-11-05 ENCOUNTER — Ambulatory Visit (INDEPENDENT_AMBULATORY_CARE_PROVIDER_SITE_OTHER): Payer: Medicare Other | Admitting: *Deleted

## 2015-11-05 ENCOUNTER — Encounter: Payer: Medicare Other | Admitting: Physician Assistant

## 2015-11-05 DIAGNOSIS — Z5181 Encounter for therapeutic drug level monitoring: Secondary | ICD-10-CM

## 2015-11-05 DIAGNOSIS — I4821 Permanent atrial fibrillation: Secondary | ICD-10-CM

## 2015-11-05 DIAGNOSIS — I482 Chronic atrial fibrillation: Secondary | ICD-10-CM | POA: Diagnosis not present

## 2015-11-05 LAB — POCT INR: INR: 1.7

## 2015-11-06 NOTE — Progress Notes (Signed)
This encounter was created in error - please disregard.

## 2015-11-10 ENCOUNTER — Other Ambulatory Visit: Payer: Self-pay | Admitting: Adult Health

## 2015-11-10 ENCOUNTER — Other Ambulatory Visit: Payer: Self-pay | Admitting: Family Medicine

## 2015-11-10 ENCOUNTER — Other Ambulatory Visit: Payer: Self-pay

## 2015-11-10 DIAGNOSIS — D649 Anemia, unspecified: Secondary | ICD-10-CM

## 2015-11-10 NOTE — Telephone Encounter (Signed)
Refill appropriate and filled per protocol. 

## 2015-11-14 ENCOUNTER — Ambulatory Visit: Payer: Medicare Other | Admitting: Adult Health

## 2015-11-19 ENCOUNTER — Ambulatory Visit (INDEPENDENT_AMBULATORY_CARE_PROVIDER_SITE_OTHER): Payer: Medicare Other | Admitting: *Deleted

## 2015-11-19 ENCOUNTER — Ambulatory Visit (INDEPENDENT_AMBULATORY_CARE_PROVIDER_SITE_OTHER): Payer: Medicare Other | Admitting: Physician Assistant

## 2015-11-19 ENCOUNTER — Encounter: Payer: Self-pay | Admitting: Physician Assistant

## 2015-11-19 VITALS — BP 160/98 | HR 62 | Ht 72.0 in | Wt 179.0 lb

## 2015-11-19 DIAGNOSIS — Z5181 Encounter for therapeutic drug level monitoring: Secondary | ICD-10-CM | POA: Diagnosis not present

## 2015-11-19 DIAGNOSIS — I10 Essential (primary) hypertension: Secondary | ICD-10-CM | POA: Diagnosis not present

## 2015-11-19 DIAGNOSIS — I482 Chronic atrial fibrillation: Secondary | ICD-10-CM

## 2015-11-19 DIAGNOSIS — Z720 Tobacco use: Secondary | ICD-10-CM

## 2015-11-19 DIAGNOSIS — I5022 Chronic systolic (congestive) heart failure: Secondary | ICD-10-CM

## 2015-11-19 DIAGNOSIS — I4821 Permanent atrial fibrillation: Secondary | ICD-10-CM

## 2015-11-19 DIAGNOSIS — N189 Chronic kidney disease, unspecified: Secondary | ICD-10-CM

## 2015-11-19 LAB — POCT INR: INR: 2

## 2015-11-19 MED ORDER — HYDRALAZINE HCL 10 MG PO TABS: 10.0000 mg | ORAL_TABLET | Freq: Three times a day (TID) | ORAL | Status: DC

## 2015-11-19 NOTE — Progress Notes (Signed)
Quick Note:  [name] [name], did we contact patient's nephrologist about abnormal CT of adrenal nodule and left perirenal fat stranding. ______

## 2015-11-19 NOTE — Progress Notes (Signed)
Cardiology Office Note    Date:  11/19/2015   ID:  [name] [name], DOB [birth year], MRN [id number]  PCP:  [name] Blackbird, MD  Cardiologist: Dr. Harl Bowie  Chief complaint 3 month follow-up  History of Present Illness:  [name] [name] is a 65 y.o. male  with a history of chronic atrial fibrillation on Coumadin, coronary artery disease, with drug-eluting stent to OM1, with chronic total occlusion of the right coronary artery, hypertension, peripheral arterial disease, chronic systolic heart failure, with most recent EF of 35-40%.Also aortic aneurysm repair (AAA), and left renal bypass,dated [id number], which was complicated by postop renal failure.  He was also readmitted one day after discharge for C. Difficile.he is followed by Dr. Ruta [name], [facility].   Patient comes in today for routine checkup. His blood pressure is quite high today. He says he saw a renal month ago and it was normal and his kidney function is better. Hemodynamic 8 acres today and did rush to get in here but when I retook his blood pressure was 200/100. He follows a low-sodium diet. He has a blood pressure cuff at home but has not been checking it. Overall he feels well. He exercises and does kayaking on the weekends. He denies chest pain, palpitations, dyspnea, dyspnea on exertion, dizziness or presyncope. Unfortunately he started smoking again because his wife will quit. He had stopped for 2 years but is now smoking 5-6 cigarettes daily.      Past Medical History  Diagnosis Date  . Arteriosclerotic cardiovascular disease (ASCVD)     a. AMI in 2000 treated at [facility]. b. cath in 12/2006->  Chronic total obstruction of the RCA;  drug-eluting stent placed in OM1, LVEF abnormal.  . Tobacco abuse, in remission     20 pack years; quit in 2009  . Hypertension   . Chronic anticoagulation   . Permanent atrial fibrillation ([place])   . PVD (peripheral vascular disease) ([place])     Ct angiogram in 2009 revealed stable disease with  80% celiac stenosis,50% right renal artery ,ASCVD with ulceration in the abdominal aortashe  . Nephrolithiasis   . Cerebrovascular disease 2002    carotid stent  . Hyperlipidemia   . Myocardial infarction ([place]) 10 yrs ago  . Testicular carcinoma ([place]) 1990    right orchiectomy  . AAA (abdominal aortic aneurysm) ([place])     a. s/p repair 06/2015 with left renal artery bypass at that time, post-op course c/b renal failure requiring dialysis, C dif.  [name] GERD (gastroesophageal reflux disease)   . Chronic combined systolic and diastolic CHF (congestive heart failure) ([place])   . CKD (chronic kidney disease), stage IV ([place])   . Anemia     Past Surgical History  Procedure Laterality Date  . Appendectomy  2004  . Testicular cancer  1990    right orchiectomy  . Colonoscopy  06/17/2011    INCOMPLETE, PREP POOR. Procedure: COLONOSCOPY;  Surgeon: [name] Dolin, MD;  [facility];  Service: Endoscopy;  Laterality: N/A;  10:00  . Esophagogastroduodenoscopy  06/17/2011    severe erosive/ulcerative reflux esophagitis, soft noncritical stricture dilatied, small hh, antral erosion   . Colonoscopy  07/15/2011    [id number] rectal and colon polyps  . Colonoscopy N/A 05/22/2015    [id number] colonic and rectal polyps. tubular adenomas.repeat TCS 05/2018  . Esophagogastroduodenoscopy N/A 05/22/2015    RMR: 2 cm HH otherwise normal. s/p empirical dilation.  . Esophageal dilation N/A 05/22/2015    Procedure: ESOPHAGEAL DILATION;  Surgeon: [name] Dolin, MD;  [facility];  Service: Endoscopy;  Laterality: N/A;  . Peripheral vascular catheterization N/A 05/28/2015    Procedure: Abdominal Aortogram;  Surgeon: [name] Mitchell, MD;  Location: [place] CV LAB;  Service: Cardiovascular;  Laterality: N/A;  . Abdominal aortic aneurysm repair N/A 06/09/2015    Procedure: ANEURYSM ABDOMINAL AORTIC REPAIR;  Surgeon: [name] Dutch, MD;  Location: [place];  Service: Vascular;  Laterality: N/A;  .  Aortic/renal bypass Left 06/09/2015    Procedure: LEFT RENAL Artery BYPASS;  Surgeon: [name] Dutch, MD;  Location: [place];  Service: Vascular;  Laterality: Left;  . Aortic endarterecetomy N/A 06/09/2015    Procedure: AORTIC ENDARTERECETOMY;  Surgeon: [name] Dutch, MD;  Location: [place];  Service: Vascular;  Laterality: N/A;  . Insertion of dialysis catheter Left 06/26/2015    Procedure: INSERTION OF DIALYSIS CATHETER;  Surgeon: [name] Mould, MD;  Location: [facility] OR;  Service: Vascular;  Laterality: Left;    Current Medications: Outpatient Prescriptions Prior to Visit  Medication Sig Dispense Refill  . albuterol (PROVENTIL HFA;VENTOLIN HFA) 108 (90 BASE) MCG/ACT inhaler Inhale 2-4 puffs into the lungs every 4 (four) hours as needed for wheezing or shortness of breath. 1 Inhaler 0  . amLODipine (NORVASC) 10 MG tablet take 1 tablet by mouth once daily 90 tablet 3  . atorvastatin (LIPITOR) 20 MG tablet Take 1 tablet (20 mg total) by mouth at bedtime. 30 tablet 3  . diphenhydrAMINE (BENADRYL) 25 MG tablet Take 25 mg by mouth daily as needed for allergies.    . fexofenadine (ALLEGRA) 180 MG tablet Take 1 tablet (180 mg total) by mouth daily. 30 tablet 2  . lisinopril (PRINIVIL,ZESTRIL) 40 MG tablet Take 40 mg by mouth daily.   0  . LORazepam (ATIVAN) 0.5 MG tablet TAKE 1 TABLET BY MOUTH TWICE DAILY AS NEEDED FOR ANXIETY 45 tablet 1  . metoprolol succinate (TOPROL-XL) 100 MG 24 hr tablet Take 1 tablet (100 mg total) by mouth daily. Take with or immediately following a meal. 90 tablet 3  . Multiple Vitamin (ONE-A-DAY MENS PO) Take 1 tablet by mouth daily.    . nitroGLYCERIN (NITROSTAT) 0.4 MG SL tablet place 1 tablet under the tongue if needed every 5 minutes for chest pain for 3 doses IF NO RELIEF AFTER 3RD DOSE CALL PRESCRIBER OR 911. 25 tablet 2  . Nutritional Supplements (FEEDING SUPPLEMENT, NEPRO CARB STEADY,) LIQD Take 237 mLs by mouth 2 (two) times daily between meals.  0  . Omega-3 Fatty  Acids (FISH OIL) 1000 MG CAPS Take 1,000 mg by mouth daily.   0  . ondansetron (ZOFRAN ODT) 4 MG disintegrating tablet Take 1 tablet (4 mg total) by mouth every 8 (eight) hours as needed for nausea or vomiting. 20 tablet 0  . pantoprazole (PROTONIX) 40 MG tablet Take 1 tablet (40 mg total) by mouth daily. 90 tablet 1  . sodium bicarbonate 650 MG tablet TAKE 1 TABLET BY MOUTH BY MOUTH THREE TIMES DAILY 90 tablet 1  . tamsulosin (FLOMAX) 0.4 MG CAPS capsule TAKE 1 CAPSULE BY MOUTH EVERY DAY AFTER SUPPER 30 capsule 3  . traZODone (DESYREL) 50 MG tablet Take 1 tablet (50 mg total) by mouth at bedtime as needed for sleep. 30 tablet 6  . warfarin (COUMADIN) 5 MG tablet Take as directed by [facility]. 40 tablet 3   No facility-administered medications prior to visit.     Allergies:   Review of  patient's allergies indicates no known allergies.   Social History   Social History  . Marital Status: Married    Spouse Name: N/A  . Number of Children: 2  . Years of Education: N/A   Occupational History  . disabled    Social History Main Topics  . Smoking status: Current Every Day Smoker -- 0.10 packs/day for 40 years    Types: Cigarettes    Last Attempt to Quit: 05/24/2015  . Smokeless tobacco: Never Used     Comment: 1/2 pack daily  . Alcohol Use: No  . Drug Use: No  . Sexual Activity: Yes    Birth Control/ Protection: None   Other Topics Concern  . None   Social History Narrative     Family History:  The patient's    family history includes Colon cancer (age of onset: 82) in his father; Prostate cancer in his father. There is no history of Liver disease, Anesthesia problems, Hypotension, Malignant hyperthermia, or Pseudochol deficiency.   ROS:   Please see the history of present illness.    Review of Systems  Constitution: Negative.  HENT: Positive for hearing loss.   Cardiovascular: Negative.   Respiratory: Negative.   Endocrine: Negative.   Hematologic/Lymphatic:  Negative.   Musculoskeletal: Negative.   Gastrointestinal: Negative.   Genitourinary: Negative.   Neurological: Negative.    All other systems reviewed and are negative.   PHYSICAL EXAM:   VS:  BP 160/98 mmHg  Pulse 62  Ht 6' (1.829 m)  Wt 179 lb (81.194 kg)  BMI 24.27 kg/m2  SpO2 97%  Physical Exam  GEN: Well nourished, well developed, in no acute distress Neck: no JVD, carotid bruits, or masses Cardiac:RRR;positive S4, no murmurs, rubs  Respiratory:  clear to auscultation bilaterally, normal work of breathing GI: soft, nontender, nondistended, + BS Ext: without cyanosis, clubbing, or edema, Good distal pulses bilaterally MS: no deformity or atrophy Skin: warm and dry, no rash Neuro:  Alert and Oriented x 3, Strength and sensation are intact Psych: euthymic mood, full affect  Wt Readings from Last 3 Encounters:  11/19/15 179 lb (81.194 kg)  10/21/15 177 lb (80.287 kg)  09/19/15 168 lb (76.204 kg)      Studies/Labs Reviewed:   EKG:  EKG is not ordered today.    Recent Labs: 06/13/2015: TSH 1.030 07/13/2015: ALT 10*; Magnesium 2.2 07/25/2015: BUN 36*; Creatinine, Ser 2.90*; Platelets 172; Potassium 3.3*; Sodium 134* 10/21/2015: Hemoglobin 12.3*   Lipid Panel    Component Value Date/Time   CHOL 141 11/07/2014 0804   TRIG 97 06/23/2015 0250   HDL 29* 11/07/2014 0804   CHOLHDL 4.9 11/07/2014 0804   VLDL 53* 11/07/2014 0804   LDLCALC 59 11/07/2014 0804    Additional studies/ records that were reviewed today include:   2-D echo 06/2015 Study Conclusions   - Left ventricle: The cavity size was normal. Systolic function was   moderately reduced. The estimated ejection fraction was in the   range of 35% to 40%. - Pulmonary arteries: PA peak pressure: 33 mm Hg (S). - Inferior vena cava: The vessel was dilated. The respirophasic   diameter changes were blunted (< 50%). This is a nonspecific   finding during positive pressure ventilation.   Impressions:   -  Technically limited study due to poor echo windows and   tachycardia. Consider TEE if more accurate evaluation is   necessary.      ASSESSMENT:    1. Essential hypertension   2.  Permanent atrial fibrillation ([place])   3. Chronic systolic heart failure ([place])   4. Tobacco abuse   5. Chronic kidney disease, unspecified stage      PLAN:  In order of problems listed above:  Central hypertension patient's blood pressure is quite high today. He took all his medications and is following a low-sodium diet. We'll add hydralazine 10 mg every 8 hours. Follow-up renal function next week and office visit with me. Follow-up with Dr. Harl Bowie in 2 months. Patient's to take his blood pressures at home and bring the readings with him.  Chronic atrial fibrillation with controlled ventricular rate on Coumadin and metoprolol  Chronic systolic heart failure without evidence of heart failure. EF 35-40%  Tobacco abuse unfortunately the patient started smoking again. Smoking cessation discussed with patient in detail.  CKD followed by renal but we don't have most recent labs. Will check renal function next week.    Medication Adjustments/Labs and Tests Ordered: Current medicines are reviewed at length with the patient today.  Concerns regarding medicines are outlined above.  Medication changes, Labs and Tests ordered today are listed in the Patient Instructions below. There are no Patient Instructions on file for this visit.   Signed, Ermalinda Barrios, PA-C  11/19/2015 2:54 PM    [facility] [address], [place], [place]  [address] Phone: [phone]; Fax: [phone]

## 2015-11-19 NOTE — Patient Instructions (Signed)
Medication Instructions:  Start hydralazine 10 mg THREE TIMES DAILY ( EVERY 8 HOURS)   Labwork: NONE  Testing/Procedures: NONE  Follow-Up: Your physician recommends that you schedule a follow-up appointment in: [place] MICHELE LENZE, P.A.   Your physician recommends that you schedule a follow-up appointment in: 2 MONTHS WITH DR. BRANCH     Any Other Special Instructions Will Be Listed Below (If Applicable).  PLEASE TAKE YOUR BLOOD PRESSURE DAILY AND KEEP A RECORD OF IT   I HAVE GIVEN YOU A 2 GRAM SODIUM DIET TO FOLLOW     If you need a refill on your cardiac medications before your next appointment, please call your pharmacy.

## 2015-12-08 ENCOUNTER — Encounter: Payer: Self-pay | Admitting: *Deleted

## 2015-12-08 ENCOUNTER — Other Ambulatory Visit: Payer: Self-pay | Admitting: Family Medicine

## 2015-12-10 ENCOUNTER — Encounter: Payer: Self-pay | Admitting: Physician Assistant

## 2015-12-10 ENCOUNTER — Ambulatory Visit: Payer: Medicare Other | Admitting: Physician Assistant

## 2015-12-10 DIAGNOSIS — R0989 Other specified symptoms and signs involving the circulatory and respiratory systems: Secondary | ICD-10-CM

## 2015-12-23 ENCOUNTER — Ambulatory Visit: Payer: Medicare Other | Admitting: Family Medicine

## 2015-12-29 ENCOUNTER — Ambulatory Visit (INDEPENDENT_AMBULATORY_CARE_PROVIDER_SITE_OTHER): Payer: Medicare Other | Admitting: *Deleted

## 2015-12-29 DIAGNOSIS — Z5181 Encounter for therapeutic drug level monitoring: Secondary | ICD-10-CM

## 2015-12-29 DIAGNOSIS — I482 Chronic atrial fibrillation: Secondary | ICD-10-CM | POA: Diagnosis not present

## 2015-12-29 DIAGNOSIS — I4821 Permanent atrial fibrillation: Secondary | ICD-10-CM

## 2015-12-29 LAB — POCT INR: INR: 2.8

## 2016-01-07 ENCOUNTER — Other Ambulatory Visit: Payer: Self-pay | Admitting: Family Medicine

## 2016-01-10 ENCOUNTER — Other Ambulatory Visit: Payer: Self-pay | Admitting: Family Medicine

## 2016-01-12 NOTE — Telephone Encounter (Signed)
Ok to refill??  Last office visit 10/21/2015.  Last refill 09/19/2015, #1 refill.

## 2016-01-12 NOTE — Telephone Encounter (Signed)
okay

## 2016-01-13 NOTE — Telephone Encounter (Signed)
Rx called in 

## 2016-01-26 ENCOUNTER — Ambulatory Visit (INDEPENDENT_AMBULATORY_CARE_PROVIDER_SITE_OTHER): Payer: Medicare Other | Admitting: *Deleted

## 2016-01-26 DIAGNOSIS — I482 Chronic atrial fibrillation: Secondary | ICD-10-CM | POA: Diagnosis not present

## 2016-01-26 DIAGNOSIS — Z5181 Encounter for therapeutic drug level monitoring: Secondary | ICD-10-CM | POA: Diagnosis not present

## 2016-01-26 DIAGNOSIS — I4821 Permanent atrial fibrillation: Secondary | ICD-10-CM

## 2016-01-26 LAB — POCT INR: INR: 3.3

## 2016-02-03 ENCOUNTER — Ambulatory Visit (INDEPENDENT_AMBULATORY_CARE_PROVIDER_SITE_OTHER): Payer: Medicare Other | Admitting: Cardiology

## 2016-02-03 ENCOUNTER — Encounter: Payer: Self-pay | Admitting: Cardiology

## 2016-02-03 VITALS — BP 162/76 | HR 95 | Ht 72.0 in | Wt 177.0 lb

## 2016-02-03 DIAGNOSIS — I2510 Atherosclerotic heart disease of native coronary artery without angina pectoris: Secondary | ICD-10-CM

## 2016-02-03 DIAGNOSIS — I10 Essential (primary) hypertension: Secondary | ICD-10-CM

## 2016-02-03 DIAGNOSIS — I4891 Unspecified atrial fibrillation: Secondary | ICD-10-CM

## 2016-02-03 DIAGNOSIS — I5022 Chronic systolic (congestive) heart failure: Secondary | ICD-10-CM | POA: Diagnosis not present

## 2016-02-03 MED ORDER — METOPROLOL SUCCINATE ER 100 MG PO TB24: ORAL_TABLET | ORAL | 3 refills | Status: DC

## 2016-02-03 NOTE — Progress Notes (Signed)
Clinical Summary Mr. Mummert is a 65 y.o.male seen today for follow up of the following medical problems.   1. Afib  - no recent palpitations - compliant with meds - no bleeding troubles on coumadin.   2. CAD - history of prior stenting as documented below -  Denies any chest pain since last visit  3. HTN - compliant with meds - does not check bp regularly at home.   4. PAD - s/p repair of juxtarenal abdominal aortic aneurysm, left renal artery bypass, followed by vascular  5. Chronic sysotlic HF - ehco 10/6544 LVEF 35-40% - no recent SOB/DOE, no LE edema  6. CKD IV - developed after recent vascular surgery, required HD for a period of time.  - no longer requiring HD, still with fairy siginificant renal dysfunction  - he thinks kidney function has normalized. Appears he was restarted on lisionpril  Past Medical History:  Diagnosis Date  . AAA (abdominal aortic aneurysm) ([place])    a. s/p repair 06/2015 with left renal artery bypass at that time, post-op course c/b renal failure requiring dialysis, C dif.  . Anemia   . Arteriosclerotic cardiovascular disease (ASCVD)    a. AMI in 2000 treated at [facility]. b. cath in 12/2006->  Chronic total obstruction of the RCA;  drug-eluting stent placed in OM1, LVEF abnormal.  . Cerebrovascular disease 2002   carotid stent  . Chronic anticoagulation   . Chronic combined systolic and diastolic CHF (congestive heart failure) ([place])   . CKD (chronic kidney disease), stage IV ([place])   . GERD (gastroesophageal reflux disease)   . Hyperlipidemia   . Hypertension   . Myocardial infarction 10 yrs ago  . Nephrolithiasis   . Permanent atrial fibrillation ([place])   . PVD (peripheral vascular disease) ([place])    Ct angiogram in 2009 revealed stable disease with 80% celiac stenosis,50% right renal artery ,ASCVD with ulceration in the abdominal aortashe  . Testicular carcinoma ([place]) 1990   right orchiectomy  . Tobacco abuse, in remission    20  pack years; quit in 2009     No Known Allergies   Current Outpatient Prescriptions  Medication Sig Dispense Refill  . albuterol (PROVENTIL HFA;VENTOLIN HFA) 108 (90 BASE) MCG/ACT inhaler Inhale 2-4 puffs into the lungs every 4 (four) hours as needed for wheezing or shortness of breath. 1 Inhaler 0  . amLODipine (NORVASC) 10 MG tablet take 1 tablet by mouth once daily 90 tablet 3  . atorvastatin (LIPITOR) 20 MG tablet TAKE 1 TABLET BY MOUTH AT BEDTIME. 30 tablet 3  . diphenhydrAMINE (BENADRYL) 25 MG tablet Take 25 mg by mouth daily as needed for allergies.    . ferrous sulfate 325 (65 FE) MG tablet TAKE 1 TABLET BY MOUTH THREE TIMES DAILY WITH FOOD 90 tablet 2  . fexofenadine (ALLEGRA) 180 MG tablet Take 1 tablet (180 mg total) by mouth daily. 30 tablet 2  . hydrALAZINE (APRESOLINE) 10 MG tablet Take 1 tablet (10 mg total) by mouth 3 (three) times daily. 90 tablet 3  . lisinopril (PRINIVIL,ZESTRIL) 40 MG tablet Take 40 mg by mouth daily.   0  . LORazepam (ATIVAN) 0.5 MG tablet TAKE 1 TABLET BY MOUTH TWICE DAILY AS NEEDED 45 tablet 1  . metoprolol succinate (TOPROL-XL) 100 MG 24 hr tablet Take 1 tablet (100 mg total) by mouth daily. Take with or immediately following a meal. 90 tablet 3  . Multiple Vitamin (ONE-A-DAY MENS PO) Take 1 tablet by mouth  daily.    . nitroGLYCERIN (NITROSTAT) 0.4 MG SL tablet place 1 tablet under the tongue if needed every 5 minutes for chest pain for 3 doses IF NO RELIEF AFTER 3RD DOSE CALL PRESCRIBER OR 911. 25 tablet 2  . Nutritional Supplements (FEEDING SUPPLEMENT, NEPRO CARB STEADY,) LIQD Take 237 mLs by mouth 2 (two) times daily between meals.  0  . Omega-3 Fatty Acids (FISH OIL) 1000 MG CAPS Take 1,000 mg by mouth daily.   0  . ondansetron (ZOFRAN ODT) 4 MG disintegrating tablet Take 1 tablet (4 mg total) by mouth every 8 (eight) hours as needed for nausea or vomiting. 20 tablet 0  . pantoprazole (PROTONIX) 40 MG tablet TAKE 1 TABLET BY MOUTH EVERY DAY 90  tablet 0  . sodium bicarbonate 650 MG tablet TAKE 1 TABLET BY MOUTH BY MOUTH THREE TIMES DAILY 90 tablet 1  . tamsulosin (FLOMAX) 0.4 MG CAPS capsule TAKE 1 CAPSULE BY MOUTH EVERY DAY AFTER SUPPER 30 capsule 3  . traZODone (DESYREL) 50 MG tablet Take 1 tablet (50 mg total) by mouth at bedtime as needed for sleep. 30 tablet 6  . warfarin (COUMADIN) 5 MG tablet Take as directed by [facility]. 40 tablet 3   No current facility-administered medications for this visit.      Past Surgical History:  Procedure Laterality Date  . ABDOMINAL AORTIC ANEURYSM REPAIR N/A 06/09/2015   Procedure: ANEURYSM ABDOMINAL AORTIC REPAIR;  Surgeon: Elam Dutch, MD;  Location: [facility] OR;  Service: Vascular;  Laterality: N/A;  . AORTIC ENDARTERECETOMY N/A 06/09/2015   Procedure: AORTIC ENDARTERECETOMY;  Surgeon: Elam Dutch, MD;  Location: [place];  Service: Vascular;  Laterality: N/A;  . AORTIC/RENAL BYPASS Left 06/09/2015   Procedure: LEFT RENAL Artery BYPASS;  Surgeon: Elam Dutch, MD;  Location: [place];  Service: Vascular;  Laterality: Left;  . APPENDECTOMY  2004  . COLONOSCOPY  06/17/2011   INCOMPLETE, PREP POOR. Procedure: COLONOSCOPY;  Surgeon: Daneil Dolin, MD;  [facility];  Service: Endoscopy;  Laterality: N/A;  10:00  . COLONOSCOPY  07/15/2011   AUQ:[id number] rectal and colon polyps  . COLONOSCOPY N/A 05/22/2015   BWL:SLHTDSKA colonic and rectal polyps. tubular adenomas.repeat TCS 05/2018  . ESOPHAGEAL DILATION N/A 05/22/2015   Procedure: ESOPHAGEAL DILATION;  Surgeon: Daneil Dolin, MD;  [facility];  Service: Endoscopy;  Laterality: N/A;  . ESOPHAGOGASTRODUODENOSCOPY  06/17/2011   severe erosive/ulcerative reflux esophagitis, soft noncritical stricture dilatied, small hh, antral erosion   . ESOPHAGOGASTRODUODENOSCOPY N/A 05/22/2015   RMR: 2 cm HH otherwise normal. s/p empirical dilation.  . INSERTION OF DIALYSIS CATHETER Left 06/26/2015   Procedure: INSERTION OF DIALYSIS  CATHETER;  Surgeon: Angelia Mould, MD;  Location: [place];  Service: Vascular;  Laterality: Left;  . PERIPHERAL VASCULAR CATHETERIZATION N/A 05/28/2015   Procedure: Abdominal Aortogram;  Surgeon: Serafina Mitchell, MD;  Location: [place] CV LAB;  Service: Cardiovascular;  Laterality: N/A;  . testicular cancer  1990   right orchiectomy     No Known Allergies    Family History  Problem Relation Age of Onset  . Colon cancer Father 30    deceased  . Prostate cancer Father   . Liver disease Neg Hx   . Anesthesia problems Neg Hx   . Hypotension Neg Hx   . Malignant hyperthermia Neg Hx   . Pseudochol deficiency Neg Hx      Social History Mr. Herbers reports that he has been  smoking Cigarettes.  He has a 4.00 pack-year smoking history. He has never used smokeless tobacco. Mr. Chillemi reports that he does not drink alcohol.   Review of Systems CONSTITUTIONAL: No weight loss, fever, chills, weakness or fatigue.  HEENT: Eyes: No visual loss, blurred vision, double vision or yellow sclerae.No hearing loss, sneezing, congestion, runny nose or sore throat.  SKIN: No rash or itching.  CARDIOVASCULAR: per HPI RESPIRATORY: No shortness of breath, cough or sputum.  GASTROINTESTINAL: No anorexia, nausea, vomiting or diarrhea. No abdominal pain or blood.  GENITOURINARY: No burning on urination, no polyuria NEUROLOGICAL: No headache, dizziness, syncope, paralysis, ataxia, numbness or tingling in the extremities. No change in bowel or bladder control.  MUSCULOSKELETAL: No muscle, back pain, joint pain or stiffness.  LYMPHATICS: No enlarged nodes. No history of splenectomy.  PSYCHIATRIC: No history of depression or anxiety.  ENDOCRINOLOGIC: No reports of sweating, cold or heat intolerance. No polyuria or polydipsia.  [name]   Physical Examination Vitals:   02/03/16 1110  BP: (!) 162/76  Pulse: 95   Vitals:   02/03/16 1110  Weight: 177 lb (80.3 kg)  Height: 6' (1.829 m)    Gen:  resting comfortably, no acute distress HEENT: no scleral icterus, pupils equal round and reactive, no palptable cervical adenopathy,  CV: RRR, no m/r/g, no jvd Resp: Clear to auscultation bilaterally GI: abdomen is soft, non-tender, non-distended, normal bowel sounds, no hepatosplenomegaly MSK: extremities are warm, no edema.  Skin: warm, no rash Neuro:  no focal deficits Psych: appropriate affect   Diagnostic Studies 06/2015 echo - Left ventricle: The cavity size was normal. Systolic function was  moderately reduced. The estimated ejection fraction was in the  range of 35% to 40%. - Pulmonary arteries: PA peak pressure: 33 mm Hg (S). - Inferior vena cava: The vessel was dilated. The respirophasic  diameter changes were blunted (< 50%). This is a nonspecific  finding during positive pressure ventilation.  Impressions:  - Technically limited study due to poor echo windows and  tachycardia. Consider TEE if more accurate evaluation is  necessary.    Assessment and Plan  1. Afib - no recent symptoms. Continue current meds - CHADS2Vasc score is 3, continue anticoagulation  2. CAD - no current symptoms,  - we will continue current meds  3. HTN - elevated in clinic - we will increase Toprol XL to 150mg  daily  4. Chronic systolic HF - appears euvolemic, no current symptoms - increase Toprol XL to 150mg  daily   5. CKD IV - will repeat labs - request labs and notes from nephrology    F/u 4 weeks   Arnoldo Lenis, M.D.

## 2016-02-03 NOTE — Patient Instructions (Addendum)
Your physician recommends that you schedule a follow-up appointment in:  1 month Dr Harl Bowie     INCREASE Toprol XL to 150 mg daily,( take 1 1/2 tablets daily )   Get blood work       Thank you for choosing [facility] !

## 2016-02-04 LAB — BASIC METABOLIC PANEL
BUN: 22 mg/dL (ref 7–25)
CO2: 26 mmol/L (ref 20–31)
Calcium: 9.2 mg/dL (ref 8.6–10.3)
Chloride: 103 mmol/L (ref 98–110)
Creat: 1.96 mg/dL — ABNORMAL HIGH (ref 0.70–1.25)
Glucose, Bld: 99 mg/dL (ref 65–99)
Potassium: 3.4 mmol/L — ABNORMAL LOW (ref 3.5–5.3)
Sodium: 139 mmol/L (ref 135–146)

## 2016-02-04 LAB — MAGNESIUM: Magnesium: 2.1 mg/dL (ref 1.5–2.5)

## 2016-02-06 ENCOUNTER — Other Ambulatory Visit: Payer: Self-pay | Admitting: Family Medicine

## 2016-02-23 ENCOUNTER — Ambulatory Visit (INDEPENDENT_AMBULATORY_CARE_PROVIDER_SITE_OTHER): Payer: Medicare Other | Admitting: *Deleted

## 2016-02-23 DIAGNOSIS — Z5181 Encounter for therapeutic drug level monitoring: Secondary | ICD-10-CM

## 2016-02-23 DIAGNOSIS — I482 Chronic atrial fibrillation: Secondary | ICD-10-CM | POA: Diagnosis not present

## 2016-02-23 DIAGNOSIS — I4821 Permanent atrial fibrillation: Secondary | ICD-10-CM

## 2016-02-23 LAB — POCT INR: INR: 3.1

## 2016-02-24 ENCOUNTER — Encounter: Payer: Self-pay | Admitting: Vascular Surgery

## 2016-02-26 ENCOUNTER — Encounter (HOSPITAL_COMMUNITY): Payer: Medicare Other

## 2016-02-26 ENCOUNTER — Ambulatory Visit: Payer: Medicare Other | Admitting: Vascular Surgery

## 2016-03-02 ENCOUNTER — Ambulatory Visit: Payer: Medicare Other | Admitting: Adult Health

## 2016-03-08 ENCOUNTER — Other Ambulatory Visit: Payer: Self-pay | Admitting: Family Medicine

## 2016-03-08 ENCOUNTER — Other Ambulatory Visit: Payer: Self-pay | Admitting: Adult Health

## 2016-03-08 NOTE — Telephone Encounter (Signed)
LRF Lorazepam 01/13/16 #45 + 1   LOV 08/19/15  OK refill?

## 2016-03-08 NOTE — Telephone Encounter (Signed)
Okay to refill? 

## 2016-03-09 NOTE — Telephone Encounter (Signed)
Medication refilled per protocol. 

## 2016-03-15 ENCOUNTER — Ambulatory Visit: Payer: Medicare Other | Admitting: Adult Health

## 2016-03-15 ENCOUNTER — Encounter: Payer: Self-pay | Admitting: Adult Health

## 2016-03-15 NOTE — Progress Notes (Deleted)
Cardiology Office Note   Date:  03/15/2016   ID:  DAQUAVION CATALA, DOB Oct 17, 1950, MRN [id number]  PCP:  Vic Blackbird, MD  Cardiologist: Cloria Spring, NP   No chief complaint on file.     History of Present Illness: [name] [name] is a 65 y.o. male who presents for ongoing assessment and management of atrial fibrillation, CHADS VASC score of 3 coronary artery disease, hypertension, peripheral arterial disease, status post repair of juxtarenal abdominal aortic aneurysm, left renal artery bypass followed by vein and vascular, chronic systolic heart failure with most recent ejection fraction between 35 and 40% on 06/2015, chronic kidney disease stage IV.[name]   The patient was last seen by Dr. Harl Bowie on 02/03/2016, at which time he was clinically stable. Metoprolol was increased 250 mg daily due to elevated blood pressure. Follow-up BMET was ordered by nephrology.    Past Medical History:  Diagnosis Date  . AAA (abdominal aortic aneurysm) ([place])    a. s/p repair 06/2015 with left renal artery bypass at that time, post-op course c/b renal failure requiring dialysis, C dif.  . Anemia   . Arteriosclerotic cardiovascular disease (ASCVD)    a. AMI in 2000 treated at [facility]. b. cath in 12/2006->  Chronic total obstruction of the RCA;  drug-eluting stent placed in OM1, LVEF abnormal.  . Cerebrovascular disease 2002   carotid stent  . Chronic anticoagulation   . Chronic combined systolic and diastolic CHF (congestive heart failure) ([place])   . CKD (chronic kidney disease), stage IV ([place])   . GERD (gastroesophageal reflux disease)   . Hyperlipidemia   . Hypertension   . Myocardial infarction 10 yrs ago  . Nephrolithiasis   . Permanent atrial fibrillation ([place])   . PVD (peripheral vascular disease) ([place])    Ct angiogram in 2009 revealed stable disease with 80% celiac stenosis,50% right renal artery ,ASCVD with ulceration in the abdominal aortashe  . Testicular carcinoma ([place]) 1990    right orchiectomy  . Tobacco abuse, in remission    20 pack years; quit in 2009    Past Surgical History:  Procedure Laterality Date  . ABDOMINAL AORTIC ANEURYSM REPAIR N/A 06/09/2015   Procedure: ANEURYSM ABDOMINAL AORTIC REPAIR;  Surgeon: Elam Dutch, MD;  Location: [facility] OR;  Service: Vascular;  Laterality: N/A;  . AORTIC ENDARTERECETOMY N/A 06/09/2015   Procedure: AORTIC ENDARTERECETOMY;  Surgeon: Elam Dutch, MD;  Location: [place];  Service: Vascular;  Laterality: N/A;  . AORTIC/RENAL BYPASS Left 06/09/2015   Procedure: LEFT RENAL Artery BYPASS;  Surgeon: Elam Dutch, MD;  Location: [place];  Service: Vascular;  Laterality: Left;  . APPENDECTOMY  2004  . COLONOSCOPY  06/17/2011   INCOMPLETE, PREP POOR. Procedure: COLONOSCOPY;  Surgeon: Daneil Dolin, MD;  [facility];  Service: Endoscopy;  Laterality: N/A;  10:00  . COLONOSCOPY  07/15/2011   DEY:CXKGYJEH rectal and colon polyps  . COLONOSCOPY N/A 05/22/2015   UDJ:SHFWYOVZ colonic and rectal polyps. tubular adenomas.repeat TCS 05/2018  . ESOPHAGEAL DILATION N/A 05/22/2015   Procedure: ESOPHAGEAL DILATION;  Surgeon: Daneil Dolin, MD;  [facility];  Service: Endoscopy;  Laterality: N/A;  . ESOPHAGOGASTRODUODENOSCOPY  06/17/2011   severe erosive/ulcerative reflux esophagitis, soft noncritical stricture dilatied, small hh, antral erosion   . ESOPHAGOGASTRODUODENOSCOPY N/A 05/22/2015   RMR: 2 cm HH otherwise normal. s/p empirical dilation.  . INSERTION OF DIALYSIS CATHETER Left 06/26/2015   Procedure: INSERTION OF DIALYSIS CATHETER;  Surgeon: Judeth Cornfield  Scot Dock, MD;  Location: [place];  Service: Vascular;  Laterality: Left;  . PERIPHERAL VASCULAR CATHETERIZATION N/A 05/28/2015   Procedure: Abdominal Aortogram;  Surgeon: Serafina Mitchell, MD;  Location: [place] CV LAB;  Service: Cardiovascular;  Laterality: N/A;  . testicular cancer  1990   right orchiectomy     Current Outpatient Prescriptions   Medication Sig Dispense Refill  . albuterol (PROVENTIL HFA;VENTOLIN HFA) 108 (90 BASE) MCG/ACT inhaler Inhale 2-4 puffs into the lungs every 4 (four) hours as needed for wheezing or shortness of breath. 1 Inhaler 0  . amLODipine (NORVASC) 10 MG tablet take 1 tablet by mouth once daily 90 tablet 3  . atorvastatin (LIPITOR) 20 MG tablet TAKE 1 TABLET BY MOUTH AT BEDTIME. 30 tablet 3  . diphenhydrAMINE (BENADRYL) 25 MG tablet Take 25 mg by mouth daily as needed for allergies.    . ferrous sulfate 325 (65 FE) MG tablet TAKE 1 TABLET BY MOUTH THREE TIMES DAILY WITH FOOD 90 tablet 2  . fexofenadine (ALLEGRA) 180 MG tablet Take 1 tablet (180 mg total) by mouth daily. 30 tablet 2  . hydrALAZINE (APRESOLINE) 10 MG tablet Take 1 tablet (10 mg total) by mouth 3 (three) times daily. 90 tablet 3  . lisinopril (PRINIVIL,ZESTRIL) 40 MG tablet Take 40 mg by mouth daily.   0  . LORazepam (ATIVAN) 0.5 MG tablet TAKE 1 TABLET BY MOUTH TWICE DAILY AS NEEDED - MUST LAST 30 DAYS 45 tablet 1  . metoprolol succinate (TOPROL XL) 100 MG 24 hr tablet Take 1 and 1/2 tablets (150 mg ) daily 135 tablet 3  . Multiple Vitamin (ONE-A-DAY MENS PO) Take 1 tablet by mouth daily.    . nitroGLYCERIN (NITROSTAT) 0.4 MG SL tablet place 1 tablet under the tongue if needed every 5 minutes for chest pain for 3 doses IF NO RELIEF AFTER 3RD DOSE CALL PRESCRIBER OR 911. 25 tablet 2  . Nutritional Supplements (FEEDING SUPPLEMENT, NEPRO CARB STEADY,) LIQD Take 237 mLs by mouth 2 (two) times daily between meals.  0  . Omega-3 Fatty Acids (FISH OIL) 1000 MG CAPS Take 1,000 mg by mouth daily.   0  . ondansetron (ZOFRAN ODT) 4 MG disintegrating tablet Take 1 tablet (4 mg total) by mouth every 8 (eight) hours as needed for nausea or vomiting. 20 tablet 0  . pantoprazole (PROTONIX) 40 MG tablet TAKE 1 TABLET BY MOUTH EVERY DAY 90 tablet 0  . sodium bicarbonate 650 MG tablet TAKE 1 TABLET BY MOUTH BY MOUTH THREE TIMES DAILY 90 tablet 1  . tamsulosin  (FLOMAX) 0.4 MG CAPS capsule TAKE 1 CAPSULE BY MOUTH EVERY DAY AFTER SUPPER 30 capsule 3  . traZODone (DESYREL) 50 MG tablet Take 1 tablet (50 mg total) by mouth at bedtime as needed for sleep. 30 tablet 6  . warfarin (COUMADIN) 5 MG tablet Take 1 1/2 tablets daily except 1 tablet on Mondays and Thursdays 45 tablet 4   No current facility-administered medications for this visit.     Allergies:   Patient has no known allergies.    Social History:  The patient  reports that he has been smoking Cigarettes.  He has a 4.00 pack-year smoking history. He has never used smokeless tobacco. He reports that he does not drink alcohol or use drugs.   Family History:  The patient's family history includes Colon cancer (age of onset: 28) in his father; Prostate cancer in his father.    ROS: All other systems are reviewed  and negative. Unless otherwise mentioned in H&P    PHYSICAL EXAM: VS:  There were no vitals taken for this visit. , BMI There is no height or weight on file to calculate BMI. GEN: Well nourished, well developed, in no acute distress  HEENT: normal  Neck: no JVD, carotid bruits, or masses Cardiac: ***RRR; no murmurs, rubs, or gallops,no edema  Respiratory:  clear to auscultation bilaterally, normal work of breathing GI: soft, nontender, nondistended, + BS MS: no deformity or atrophy  Skin: warm and dry, no rash Neuro:  Strength and sensation are intact Psych: euthymic mood, full affect   EKG:  EKG {ACTION; IS/IS [id number]} ordered today. The ekg ordered today demonstrates ***   Recent Labs: 06/13/2015: TSH 1.030 07/13/2015: ALT 10 07/25/2015: Platelets 172 10/21/2015: Hemoglobin 12.3 02/03/2016: BUN 22; Creat 1.96; Magnesium 2.1; Potassium 3.4; Sodium 139    Lipid Panel    Component Value Date/Time   CHOL 141 11/07/2014 0804   TRIG 97 06/23/2015 0250   HDL 29 (L) 11/07/2014 0804   CHOLHDL 4.9 11/07/2014 0804   VLDL 53 (H) 11/07/2014 0804   LDLCALC 59 11/07/2014 0804       Wt Readings from Last 3 Encounters:  02/03/16 177 lb (80.3 kg)  11/19/15 179 lb (81.2 kg)  10/21/15 177 lb (80.3 kg)      Other studies Reviewed: Additional studies/ records that were reviewed today include: ***. Review of the above records demonstrates: ***   ASSESSMENT AND PLAN:  1.  ***   Current medicines are reviewed at length with the patient today.    Labs/ tests ordered today include: *** No orders of the defined types were placed in this encounter.    Disposition:   FU with *** in {gen number [id number]} {TIME; UNITS DAY/WEEK/MONTH:19136}   Signed, Jory Sims, NP  03/15/2016 7:09 AM    [facility] [address], [place], [place] [address] Phone: [phone]; Fax: [phone]

## 2016-03-22 ENCOUNTER — Encounter: Payer: Self-pay | Admitting: *Deleted

## 2016-03-31 ENCOUNTER — Ambulatory Visit (INDEPENDENT_AMBULATORY_CARE_PROVIDER_SITE_OTHER): Payer: Medicare Other | Admitting: *Deleted

## 2016-03-31 DIAGNOSIS — I482 Chronic atrial fibrillation: Secondary | ICD-10-CM

## 2016-03-31 DIAGNOSIS — Z5181 Encounter for therapeutic drug level monitoring: Secondary | ICD-10-CM

## 2016-03-31 DIAGNOSIS — I4821 Permanent atrial fibrillation: Secondary | ICD-10-CM

## 2016-03-31 LAB — POCT INR: INR: 4.1

## 2016-04-06 ENCOUNTER — Other Ambulatory Visit: Payer: Self-pay | Admitting: Family Medicine

## 2016-04-06 ENCOUNTER — Other Ambulatory Visit: Payer: Self-pay | Admitting: Physician Assistant

## 2016-04-14 ENCOUNTER — Encounter: Payer: Self-pay | Admitting: *Deleted

## 2016-05-10 ENCOUNTER — Other Ambulatory Visit: Payer: Self-pay | Admitting: Family Medicine

## 2016-05-10 NOTE — Telephone Encounter (Signed)
Ok to refill??  Last office visit 09/19/2015.  Last refill 03/09/2016, #1 refill.

## 2016-05-10 NOTE — Telephone Encounter (Signed)
okay

## 2016-05-10 NOTE — Telephone Encounter (Signed)
Medication called to pharmacy. 

## 2016-05-17 ENCOUNTER — Ambulatory Visit (INDEPENDENT_AMBULATORY_CARE_PROVIDER_SITE_OTHER): Payer: Medicare Other | Admitting: *Deleted

## 2016-05-17 DIAGNOSIS — I4821 Permanent atrial fibrillation: Secondary | ICD-10-CM

## 2016-05-17 DIAGNOSIS — Z5181 Encounter for therapeutic drug level monitoring: Secondary | ICD-10-CM

## 2016-05-17 DIAGNOSIS — I482 Chronic atrial fibrillation: Secondary | ICD-10-CM | POA: Diagnosis not present

## 2016-05-17 LAB — POCT INR: INR: 2.7

## 2016-06-07 ENCOUNTER — Other Ambulatory Visit: Payer: Self-pay | Admitting: Family Medicine

## 2016-06-14 ENCOUNTER — Encounter: Payer: Self-pay | Admitting: *Deleted

## 2016-07-05 ENCOUNTER — Other Ambulatory Visit: Payer: Self-pay | Admitting: Family Medicine

## 2016-07-05 NOTE — Telephone Encounter (Signed)
Medication called to pharmacy. 

## 2016-07-05 NOTE — Telephone Encounter (Signed)
Okay to refill? 

## 2016-07-05 NOTE — Telephone Encounter (Signed)
Ok to refill??  Last office visit 09/18/2015.  Last refill 1/8/208, #1 refill.

## 2016-07-07 ENCOUNTER — Ambulatory Visit (INDEPENDENT_AMBULATORY_CARE_PROVIDER_SITE_OTHER): Payer: Medicare Other | Admitting: *Deleted

## 2016-07-07 DIAGNOSIS — Z5181 Encounter for therapeutic drug level monitoring: Secondary | ICD-10-CM

## 2016-07-07 DIAGNOSIS — I482 Chronic atrial fibrillation: Secondary | ICD-10-CM | POA: Diagnosis not present

## 2016-07-07 DIAGNOSIS — I4821 Permanent atrial fibrillation: Secondary | ICD-10-CM

## 2016-07-07 LAB — POCT INR: INR: 3

## 2016-08-04 ENCOUNTER — Encounter: Payer: Self-pay | Admitting: *Deleted

## 2016-08-20 ENCOUNTER — Other Ambulatory Visit: Payer: Self-pay | Admitting: Adult Health

## 2016-08-20 ENCOUNTER — Other Ambulatory Visit: Payer: Self-pay | Admitting: *Deleted

## 2016-08-20 MED ORDER — WARFARIN SODIUM 5 MG PO TABS: ORAL_TABLET | ORAL | 0 refills | Status: DC

## 2016-08-20 NOTE — Telephone Encounter (Signed)
Pt was no show in feb and April, refilled #30, has apt with L Reid rescheduled for 08/23/16

## 2016-08-20 NOTE — Telephone Encounter (Signed)
Refill on Warfarin. Patient is completely out. / tg

## 2016-08-23 ENCOUNTER — Ambulatory Visit (INDEPENDENT_AMBULATORY_CARE_PROVIDER_SITE_OTHER): Payer: Medicare Other | Admitting: *Deleted

## 2016-08-23 DIAGNOSIS — I482 Chronic atrial fibrillation: Secondary | ICD-10-CM

## 2016-08-23 DIAGNOSIS — I4821 Permanent atrial fibrillation: Secondary | ICD-10-CM

## 2016-08-23 DIAGNOSIS — Z5181 Encounter for therapeutic drug level monitoring: Secondary | ICD-10-CM | POA: Diagnosis not present

## 2016-08-23 LAB — POCT INR: INR: 1.2

## 2016-08-23 MED ORDER — WARFARIN SODIUM 5 MG PO TABS: ORAL_TABLET | ORAL | 3 refills | Status: DC

## 2016-08-26 ENCOUNTER — Telehealth: Payer: Self-pay | Admitting: *Deleted

## 2016-08-26 MED ORDER — ATORVASTATIN CALCIUM 20 MG PO TABS: 20.0000 mg | ORAL_TABLET | Freq: Every day | ORAL | 3 refills | Status: DC

## 2016-08-26 MED ORDER — HYDRALAZINE HCL 10 MG PO TABS: 10.0000 mg | ORAL_TABLET | Freq: Three times a day (TID) | ORAL | 3 refills | Status: DC

## 2016-08-26 MED ORDER — LISINOPRIL 40 MG PO TABS: 40.0000 mg | ORAL_TABLET | Freq: Every day | ORAL | 0 refills | Status: DC

## 2016-08-26 NOTE — Telephone Encounter (Signed)
Received fax requesting refill on Lipitor, Lisinopril and Hydralazine.   Prescription sent to pharmacy.

## 2016-09-06 ENCOUNTER — Encounter: Payer: Self-pay | Admitting: *Deleted

## 2016-09-07 ENCOUNTER — Encounter: Payer: Self-pay | Admitting: Internal Medicine

## 2016-09-24 ENCOUNTER — Other Ambulatory Visit: Payer: Self-pay | Admitting: Family Medicine

## 2016-10-25 ENCOUNTER — Ambulatory Visit (INDEPENDENT_AMBULATORY_CARE_PROVIDER_SITE_OTHER): Payer: Medicare Other | Admitting: *Deleted

## 2016-10-25 ENCOUNTER — Encounter: Payer: Self-pay | Admitting: Adult Health

## 2016-10-25 ENCOUNTER — Ambulatory Visit (INDEPENDENT_AMBULATORY_CARE_PROVIDER_SITE_OTHER): Payer: Medicare Other | Admitting: Adult Health

## 2016-10-25 VITALS — BP 126/78 | HR 60 | Ht 72.0 in | Wt 190.0 lb

## 2016-10-25 DIAGNOSIS — Z5181 Encounter for therapeutic drug level monitoring: Secondary | ICD-10-CM | POA: Diagnosis not present

## 2016-10-25 DIAGNOSIS — I482 Chronic atrial fibrillation, unspecified: Secondary | ICD-10-CM

## 2016-10-25 DIAGNOSIS — M79604 Pain in right leg: Secondary | ICD-10-CM

## 2016-10-25 DIAGNOSIS — I5022 Chronic systolic (congestive) heart failure: Secondary | ICD-10-CM

## 2016-10-25 DIAGNOSIS — M79605 Pain in left leg: Secondary | ICD-10-CM | POA: Diagnosis not present

## 2016-10-25 DIAGNOSIS — I2510 Atherosclerotic heart disease of native coronary artery without angina pectoris: Secondary | ICD-10-CM

## 2016-10-25 DIAGNOSIS — I739 Peripheral vascular disease, unspecified: Secondary | ICD-10-CM | POA: Diagnosis not present

## 2016-10-25 DIAGNOSIS — I4821 Permanent atrial fibrillation: Secondary | ICD-10-CM

## 2016-10-25 LAB — POCT INR: INR: 2.5

## 2016-10-25 MED ORDER — NITROGLYCERIN 0.4 MG SL SUBL: SUBLINGUAL_TABLET | SUBLINGUAL | 2 refills | Status: DC

## 2016-10-25 NOTE — Patient Instructions (Signed)
Your physician wants you to follow-up in: 6 months Dr Bryna Colander will receive a reminder letter in the mail two months in advance. If you don't receive a letter, please call our office to schedule the follow-up appointment..    Your physician has requested that you have an ankle brachial index (ABI). During this test an ultrasound and blood pressure cuff are used to evaluate the arteries that supply the arms and legs with blood. Allow thirty minutes for this exam. There are no restrictions or special instructions.    Get lab work: cbc,cmet,lipid    I refilled your nitroglycerine    Thank you for choosing [facility] !

## 2016-10-25 NOTE — Progress Notes (Signed)
Cardiology Office Note   Date:  10/25/2016   ID:  [name] [name], DOB 03/17/1951, MRN [id number]  PCP:  Alycia Rossetti, MD  Cardiologist:  [facility]  Chief Complaint  Patient presents with  . Atrial Fibrillation  . Coronary Artery Disease  . Congestive Heart Failure      History of Present Illness: [name] [name] is a 66 y.o. male who presents for ongoing assessment management of atrial fibrillation, on Coumadin therapy, coronary artery disease, history of prior stenting to OM1 with CTO of the RCA. Hypertension, peripheral arterial disease status post repair of juxtarenal abdominal aortic aneurysm, left renal artery bypass, which is followed by vascular surgery. Other history includes chronic systolic heart failure with last EF in February 2017 with an EF of 35-40%.  Patient was last seen by Dr. Harl Bowie in October 2017. The patient was stable from a cardiac battery standpoint with the exception of blood pressure control. Metoprolol was increased to 150 mg daily. Follow-up labs were ordered for kidney function.  Labs: Sodium 139; potassium 3.4; chloride 103; CO2 26; glucose 99; creatinine 1.96; BUN 22.  He is here today without any cardiac complaints. The patient remains very active, works outdoors, [employer]. He is medically compliant. He's had no recurrent chest pain dyspnea or fatigue. His only complaint is some mild lower extremity pain with walking. States he "I get a catch in my legs" goes away with rest.  Past Medical History:  Diagnosis Date  . AAA (abdominal aortic aneurysm) ([place])    a. s/p repair 06/2015 with left renal artery bypass at that time, post-op course c/b renal failure requiring dialysis, C dif.  . Anemia   . Arteriosclerotic cardiovascular disease (ASCVD)    a. AMI in 2000 treated at [facility]. b. cath in 12/2006->  Chronic total obstruction of the RCA;  drug-eluting stent placed in OM1, LVEF abnormal.  . Cerebrovascular disease 2002   carotid stent  . Chronic  anticoagulation   . Chronic combined systolic and diastolic CHF (congestive heart failure) ([place])   . CKD (chronic kidney disease), stage IV ([place])   . GERD (gastroesophageal reflux disease)   . Hyperlipidemia   . Hypertension   . Myocardial infarction ([place]) 10 yrs ago  . Nephrolithiasis   . Permanent atrial fibrillation ([place])   . PVD (peripheral vascular disease) ([place])    Ct angiogram in 2009 revealed stable disease with 80% celiac stenosis,50% right renal artery ,ASCVD with ulceration in the abdominal aortashe  . Testicular carcinoma ([place]) 1990   right orchiectomy  . Tobacco abuse, in remission    20 pack years; quit in 2009    Past Surgical History:  Procedure Laterality Date  . ABDOMINAL AORTIC ANEURYSM REPAIR N/A 06/09/2015   Procedure: ANEURYSM ABDOMINAL AORTIC REPAIR;  Surgeon: Elam Dutch, MD;  Location: [facility] OR;  Service: Vascular;  Laterality: N/A;  . AORTIC ENDARTERECETOMY N/A 06/09/2015   Procedure: AORTIC ENDARTERECETOMY;  Surgeon: Elam Dutch, MD;  Location: [place];  Service: Vascular;  Laterality: N/A;  . AORTIC/RENAL BYPASS Left 06/09/2015   Procedure: LEFT RENAL Artery BYPASS;  Surgeon: Elam Dutch, MD;  Location: [facility] [place];  Service: Vascular;  Laterality: Left;  . APPENDECTOMY  2004  . COLONOSCOPY  06/17/2011   INCOMPLETE, PREP POOR. Procedure: COLONOSCOPY;  Surgeon: Daneil Dolin, MD;  [facility];  Service: Endoscopy;  Laterality: N/A;  10:00  . COLONOSCOPY  07/15/2011   QMV:HQIONGEX rectal and colon polyps  . COLONOSCOPY N/A 05/22/2015  IHK:VQQVZDGL colonic and rectal polyps. tubular adenomas.repeat TCS 05/2018  . ESOPHAGEAL DILATION N/A 05/22/2015   Procedure: ESOPHAGEAL DILATION;  Surgeon: Daneil Dolin, MD;  [facility];  Service: Endoscopy;  Laterality: N/A;  . ESOPHAGOGASTRODUODENOSCOPY  06/17/2011   severe erosive/ulcerative reflux esophagitis, soft noncritical stricture dilatied, small hh, antral erosion   .  ESOPHAGOGASTRODUODENOSCOPY N/A 05/22/2015   RMR: 2 cm HH otherwise normal. s/p empirical dilation.  . INSERTION OF DIALYSIS CATHETER Left 06/26/2015   Procedure: INSERTION OF DIALYSIS CATHETER;  Surgeon: Angelia Mould, MD;  Location: [place];  Service: Vascular;  Laterality: Left;  . PERIPHERAL VASCULAR CATHETERIZATION N/A 05/28/2015   Procedure: Abdominal Aortogram;  Surgeon: Serafina Mitchell, MD;  Location: [place] [facility];  Service: Cardiovascular;  Laterality: N/A;  . testicular cancer  1990   right orchiectomy     Current Outpatient Prescriptions  Medication Sig Dispense Refill  . albuterol (PROVENTIL HFA;VENTOLIN HFA) 108 (90 BASE) MCG/ACT inhaler Inhale 2-4 puffs into the lungs every 4 (four) hours as needed for wheezing or shortness of breath. 1 Inhaler 0  . amLODipine (NORVASC) 10 MG tablet take 1 tablet by mouth once daily 90 tablet 3  . atorvastatin (LIPITOR) 20 MG tablet TAKE 1 TABLET BY MOUTH AT BEDTIME. 30 tablet 3  . diphenhydrAMINE (BENADRYL) 25 MG tablet Take 25 mg by mouth daily as needed for allergies.    . ferrous sulfate 325 (65 FE) MG tablet TAKE 1 TABLET BY MOUTH THREE TIMES DAILY WITH FOOD 90 tablet 2  . fexofenadine (ALLEGRA) 180 MG tablet Take 1 tablet (180 mg total) by mouth daily. 30 tablet 2  . hydrALAZINE (APRESOLINE) 10 MG tablet Take 1 tablet (10 mg total) by mouth 3 (three) times daily. 270 tablet 3  . lisinopril (PRINIVIL,ZESTRIL) 40 MG tablet Take 1 tablet (40 mg total) by mouth daily. 90 tablet 0  . LORazepam (ATIVAN) 0.5 MG tablet TAKE 1 TABLET BY MOUTH TWICE DAILY AS NEEDED **MUST LAST 30 DAYS** 45 tablet 1  . metoprolol succinate (TOPROL XL) 100 MG 24 hr tablet Take 1 and 1/2 tablets (150 mg ) daily 135 tablet 3  . Multiple Vitamin (ONE-A-DAY MENS PO) Take 1 tablet by mouth daily.    . nitroGLYCERIN (NITROSTAT) 0.4 MG SL tablet place 1 tablet under the tongue if needed every 5 minutes for chest pain for 3 doses IF NO RELIEF AFTER 3RD DOSE CALL  PRESCRIBER OR 911. 25 tablet 2  . Nutritional Supplements (FEEDING SUPPLEMENT, NEPRO CARB STEADY,) LIQD Take 237 mLs by mouth 2 (two) times daily between meals.  0  . Omega-3 Fatty Acids (FISH OIL) 1000 MG CAPS Take 1,000 mg by mouth daily.   0  . ondansetron (ZOFRAN ODT) 4 MG disintegrating tablet Take 1 tablet (4 mg total) by mouth every 8 (eight) hours as needed for nausea or vomiting. 20 tablet 0  . pantoprazole (PROTONIX) 40 MG tablet TAKE 1 TABLET BY MOUTH EVERY DAY 90 tablet 0  . sodium bicarbonate 650 MG tablet TAKE 1 TABLET BY MOUTH BY MOUTH THREE TIMES DAILY 90 tablet 1  . tamsulosin (FLOMAX) 0.4 MG CAPS capsule TAKE 1 CAPSULE BY MOUTH EVERY DAY AFTER SUPPER 30 capsule 3  . traZODone (DESYREL) 50 MG tablet TAKE 1 TABLET BY MOUTH AT BEDTIME AS NEEDED FOR SLEEP 30 tablet 6  . warfarin (COUMADIN) 5 MG tablet Take 1 1/2 tablets daily except 1 tablet on Mondays and Thursdays 45 tablet 3   No current  facility-administered medications for this visit.     Allergies:   Patient has no known allergies.    Social History:  The patient  reports that he has been smoking Cigarettes.  He has a 4.00 pack-year smoking history. He has never used smokeless tobacco. He reports that he does not drink alcohol or use drugs.   Family History:  The patient's family history includes Colon cancer (age of onset: 40) in his father; Prostate cancer in his father.    ROS: All other systems are reviewed and negative. Unless otherwise mentioned in H&P    PHYSICAL EXAM: VS:  BP 126/78   Pulse 60   Ht 6' (1.829 m)   Wt 190 lb (86.2 kg)   SpO2 98%   BMI 25.77 kg/m  , BMI Body mass index is 25.77 kg/m. GEN: Well nourished, well developed, in no acute distress  HEENT: normal  Neck: no JVD, carotid bruits, or masses Cardiac: RRR; no murmurs, rubs, or gallops,no edema  Respiratory:  clear to auscultation bilaterally, normal work of breathing, some upper airway congestion at the base of the throat, cleared with  coughing GI: soft, nontender, nondistended, + BS MS: no deformity or atrophy diminished pulse in the right, palpable dorsalis pedis pulse on the left.  Skin: warm and dry, no rash Neuro:  Strength and sensation are intact Psych: euthymic mood, full affect   EKG:  Normal sinus rhythm with frequent PACs and PVCs. HR 62 bpm  Recent Labs: 02/03/2016: BUN 22; Creat 1.96; Magnesium 2.1; Potassium 3.4; Sodium 139    Lipid Panel    Component Value Date/Time   CHOL 141 11/07/2014 0804   TRIG 97 06/23/2015 0250   HDL 29 (L) 11/07/2014 0804   CHOLHDL 4.9 11/07/2014 0804   VLDL 53 (H) 11/07/2014 0804   LDLCALC 59 11/07/2014 0804      Wt Readings from Last 3 Encounters:  10/25/16 190 lb (86.2 kg)  02/03/16 177 lb (80.3 kg)  11/19/15 179 lb (81.2 kg)      Other studies Reviewed: Echocardiogram 07-07-15 Left ventricle: The cavity size was normal. Systolic function was   moderately reduced. The estimated ejection fraction was in the   range of 35% to 40%. - Pulmonary arteries: PA peak pressure: 33 mm Hg (S). - Inferior vena cava: The vessel was dilated. The respirophasic   diameter changes were blunted (< 50%). This is a nonspecific   finding during positive pressure ventilation.  ASSESSMENT AND PLAN:  1.  Chronic systolic heart failure: He is asymptomatic at this time. Denies dyspnea on exertion fluid retention or fatigue. Last echocardiogram was done in February 2017. I'm going to repeat this for reevaluation of patient's response to medication. He may be a candidate for Entresto to optimize medications. Would like to see where he stands concerning LV function right now. Will also have a BMET to evaluate kidney function.  2. Coronary artery disease: Patient has history of stenting to the OM1 and hold occlusion of the right coronary artery. He is currently asymptomatic. Continue statin therapy, beta blocker, and ACE inhibitor. Checking lipids for ongoing risk management  3.  Peripheral arterial disease: History of repair of the juxtarenal abdominal aortic aneurysm with left renal artery bypass. He is complaining of symptoms suggestive of intermittent claudication. Will check ABIs.  4. Atrial fibrillation: Heart rate is well controlled currently, continue Coumadin therapy. Checking CBC  5. Hypertension: Blood pressure is moderately controlled today, a little higher than optimal for most recent ejection  fraction. Repeating echocardiogram continue hydralazine, amlodipine, and lisinopril.    Current medicines are reviewed at length with the patient today.    Labs/ tests ordered today include: BMET, CBC, echocardiogram, lipids  Phill Myron. West Pugh, ANP, AACC   10/25/2016 5:15 PM    [facility] [address] [place], [place] [address] Phone: [phone]; Fax: [phone]

## 2016-10-26 ENCOUNTER — Telehealth: Payer: Self-pay | Admitting: *Deleted

## 2016-10-26 DIAGNOSIS — I10 Essential (primary) hypertension: Secondary | ICD-10-CM

## 2016-10-26 NOTE — Telephone Encounter (Signed)
-----   Message from Lendon Colonel, NP sent at 10/25/2016  5:24 PM EDT ----- Regarding: Echo Please order an echo for follow up on LV fx, with hx of ICM.

## 2016-10-27 DIAGNOSIS — I482 Chronic atrial fibrillation: Secondary | ICD-10-CM | POA: Diagnosis not present

## 2016-10-27 DIAGNOSIS — I739 Peripheral vascular disease, unspecified: Secondary | ICD-10-CM | POA: Diagnosis not present

## 2016-10-27 LAB — COMPREHENSIVE METABOLIC PANEL
ALT: 7 U/L — ABNORMAL LOW (ref 9–46)
AST: 7 U/L — ABNORMAL LOW (ref 10–35)
Albumin: 3.4 g/dL — ABNORMAL LOW (ref 3.6–5.1)
Alkaline Phosphatase: 98 U/L (ref 40–115)
BUN: 28 mg/dL — ABNORMAL HIGH (ref 7–25)
CO2: 25 mmol/L (ref 20–31)
Calcium: 9.7 mg/dL (ref 8.6–10.3)
Chloride: 108 mmol/L (ref 98–110)
Creat: 2.08 mg/dL — ABNORMAL HIGH (ref 0.70–1.25)
Glucose, Bld: 95 mg/dL (ref 65–99)
Potassium: 4.6 mmol/L (ref 3.5–5.3)
Sodium: 140 mmol/L (ref 135–146)
Total Bilirubin: 0.4 mg/dL (ref 0.2–1.2)
Total Protein: 6.5 g/dL (ref 6.1–8.1)

## 2016-10-27 LAB — LIPID PANEL
Cholesterol: 152 mg/dL (ref <200)
HDL: 29 mg/dL — ABNORMAL LOW (ref >40)
LDL Cholesterol: 62 mg/dL (ref <100)
Total CHOL/HDL Ratio: 5.2 Ratio — ABNORMAL HIGH (ref <5.0)
Triglycerides: 304 mg/dL — ABNORMAL HIGH (ref <150)
VLDL: 61 mg/dL — ABNORMAL HIGH (ref <30)

## 2016-10-27 LAB — CBC
HCT: 40.3 % (ref 38.5–50.0)
Hemoglobin: 13.1 g/dL — ABNORMAL LOW (ref 13.2–17.1)
MCH: 28.4 pg (ref 27.0–33.0)
MCHC: 32.5 g/dL (ref 32.0–36.0)
MCV: 87.2 fL (ref 80.0–100.0)
MPV: 9.7 fL (ref 7.5–12.5)
Platelets: 172 10*3/uL (ref 140–400)
RBC: 4.62 MIL/uL (ref 4.20–5.80)
RDW: 15.7 % — ABNORMAL HIGH (ref 11.0–15.0)
WBC: 6.4 10*3/uL (ref 3.8–10.8)

## 2016-10-28 ENCOUNTER — Ambulatory Visit (HOSPITAL_COMMUNITY)
Admission: RE | Admit: 2016-10-28 | Discharge: 2016-10-28 | Disposition: A | Discharge status: Home / Self Care | Payer: Medicare Other | Source: Ambulatory Visit | Attending: Adult Health | Admitting: Adult Health

## 2016-10-28 DIAGNOSIS — I739 Peripheral vascular disease, unspecified: Secondary | ICD-10-CM | POA: Insufficient documentation

## 2016-10-28 DIAGNOSIS — M79605 Pain in left leg: Secondary | ICD-10-CM | POA: Diagnosis not present

## 2016-10-28 DIAGNOSIS — M79604 Pain in right leg: Secondary | ICD-10-CM | POA: Diagnosis not present

## 2016-10-29 ENCOUNTER — Telehealth: Payer: Self-pay | Admitting: Cardiology

## 2016-10-29 NOTE — Telephone Encounter (Signed)
Pre-cert Verification for the following procedure   Echo scheduled for 11/05/16 at [facility]

## 2016-11-01 ENCOUNTER — Other Ambulatory Visit: Payer: Self-pay | Admitting: Family Medicine

## 2016-11-01 NOTE — Telephone Encounter (Signed)
Ok to refill??  Last office visit 09/19/2015.  Last refill 07/25/2016, #2 refills.

## 2016-11-01 NOTE — Telephone Encounter (Signed)
Okay to refill? 

## 2016-11-02 NOTE — Telephone Encounter (Signed)
Medication called to pharmacy. 

## 2016-11-04 ENCOUNTER — Other Ambulatory Visit (HOSPITAL_COMMUNITY): Payer: Medicare Other

## 2016-11-13 ENCOUNTER — Other Ambulatory Visit: Payer: Self-pay | Admitting: Family Medicine

## 2016-12-20 ENCOUNTER — Other Ambulatory Visit: Payer: Self-pay | Admitting: Family Medicine

## 2016-12-20 ENCOUNTER — Other Ambulatory Visit (HOSPITAL_COMMUNITY)
Admission: RE | Admit: 2016-12-20 | Discharge: 2016-12-20 | Disposition: A | Discharge status: Home / Self Care | Payer: Medicare Other | Source: Ambulatory Visit | Attending: Cardiology | Admitting: Cardiology

## 2016-12-20 ENCOUNTER — Ambulatory Visit (INDEPENDENT_AMBULATORY_CARE_PROVIDER_SITE_OTHER): Payer: Medicare Other | Admitting: *Deleted

## 2016-12-20 DIAGNOSIS — I482 Chronic atrial fibrillation: Secondary | ICD-10-CM | POA: Insufficient documentation

## 2016-12-20 DIAGNOSIS — I4821 Permanent atrial fibrillation: Secondary | ICD-10-CM

## 2016-12-20 DIAGNOSIS — Z5181 Encounter for therapeutic drug level monitoring: Secondary | ICD-10-CM | POA: Insufficient documentation

## 2016-12-20 LAB — PROTIME-INR
INR: 4.75
Prothrombin Time: 45.9 seconds — ABNORMAL HIGH (ref 11.4–15.2)

## 2016-12-20 LAB — POCT INR: INR: 7.6

## 2016-12-21 ENCOUNTER — Other Ambulatory Visit: Payer: Self-pay

## 2016-12-21 MED ORDER — METOPROLOL SUCCINATE ER 100 MG PO TB24: ORAL_TABLET | ORAL | 3 refills | Status: DC

## 2016-12-21 NOTE — Telephone Encounter (Signed)
Refilled metoprolol to [facility] per request

## 2016-12-27 ENCOUNTER — Ambulatory Visit (INDEPENDENT_AMBULATORY_CARE_PROVIDER_SITE_OTHER): Payer: Medicare Other | Admitting: *Deleted

## 2016-12-27 DIAGNOSIS — I482 Chronic atrial fibrillation: Secondary | ICD-10-CM

## 2016-12-27 DIAGNOSIS — Z5181 Encounter for therapeutic drug level monitoring: Secondary | ICD-10-CM

## 2016-12-27 DIAGNOSIS — I4821 Permanent atrial fibrillation: Secondary | ICD-10-CM

## 2016-12-27 LAB — POCT INR: INR: 4

## 2016-12-28 ENCOUNTER — Ambulatory Visit (INDEPENDENT_AMBULATORY_CARE_PROVIDER_SITE_OTHER): Payer: Medicare Other | Admitting: Family Medicine

## 2016-12-28 ENCOUNTER — Encounter: Payer: Self-pay | Admitting: Family Medicine

## 2016-12-28 VITALS — BP 128/66 | HR 62 | Temp 97.8°F | Resp 18 | Ht 72.0 in | Wt 188.0 lb

## 2016-12-28 DIAGNOSIS — N189 Chronic kidney disease, unspecified: Secondary | ICD-10-CM | POA: Diagnosis not present

## 2016-12-28 DIAGNOSIS — Z125 Encounter for screening for malignant neoplasm of prostate: Secondary | ICD-10-CM

## 2016-12-28 DIAGNOSIS — R197 Diarrhea, unspecified: Secondary | ICD-10-CM | POA: Diagnosis not present

## 2016-12-28 DIAGNOSIS — F5101 Primary insomnia: Secondary | ICD-10-CM | POA: Diagnosis not present

## 2016-12-28 DIAGNOSIS — R112 Nausea with vomiting, unspecified: Secondary | ICD-10-CM

## 2016-12-28 DIAGNOSIS — Z0000 Encounter for general adult medical examination without abnormal findings: Secondary | ICD-10-CM | POA: Diagnosis not present

## 2016-12-28 DIAGNOSIS — I10 Essential (primary) hypertension: Secondary | ICD-10-CM

## 2016-12-28 DIAGNOSIS — Z720 Tobacco use: Secondary | ICD-10-CM

## 2016-12-28 DIAGNOSIS — E782 Mixed hyperlipidemia: Secondary | ICD-10-CM

## 2016-12-28 DIAGNOSIS — I2510 Atherosclerotic heart disease of native coronary artery without angina pectoris: Secondary | ICD-10-CM | POA: Diagnosis not present

## 2016-12-28 DIAGNOSIS — Z114 Encounter for screening for human immunodeficiency virus [HIV]: Secondary | ICD-10-CM | POA: Diagnosis not present

## 2016-12-28 DIAGNOSIS — Z1159 Encounter for screening for other viral diseases: Secondary | ICD-10-CM

## 2016-12-28 DIAGNOSIS — R1030 Lower abdominal pain, unspecified: Secondary | ICD-10-CM

## 2016-12-28 LAB — CBC WITH DIFFERENTIAL/PLATELET
Basophils Absolute: 0 cells/uL (ref 0–200)
Basophils Relative: 0 %
Eosinophils Absolute: 118 cells/uL (ref 15–500)
Eosinophils Relative: 2 %
HCT: 36.2 % — ABNORMAL LOW (ref 38.5–50.0)
Hemoglobin: 11.9 g/dL — ABNORMAL LOW (ref 13.0–17.0)
Lymphocytes Relative: 23 %
Lymphs Abs: 1357 cells/uL (ref 850–3900)
MCH: 29.3 pg (ref 27.0–33.0)
MCHC: 32.9 g/dL (ref 32.0–36.0)
MCV: 89.2 fL (ref 80.0–100.0)
MPV: 10 fL (ref 7.5–12.5)
Monocytes Absolute: 472 cells/uL (ref 200–950)
Monocytes Relative: 8 %
Neutro Abs: 3953 cells/uL (ref 1500–7800)
Neutrophils Relative %: 67 %
Platelets: 183 10*3/uL (ref 140–400)
RBC: 4.06 MIL/uL — ABNORMAL LOW (ref 4.20–5.80)
RDW: 15.6 % — ABNORMAL HIGH (ref 11.0–15.0)
WBC: 5.9 10*3/uL (ref 3.8–10.8)

## 2016-12-28 MED ORDER — ONDANSETRON 4 MG PO TBDP: 4.0000 mg | ORAL_TABLET | Freq: Three times a day (TID) | ORAL | 0 refills | Status: DC | PRN

## 2016-12-28 NOTE — Progress Notes (Signed)
Subjective:   Patient presents for Medicare Annual/Subsequent preventive examination.   Last OV May 2017    Medications reviewed  Seen by cardiology in June - reviewed note , had non fasting labs TG were high at  304 ,LDL at goal at  62 . BUN 28/Creatining  2.08 , renal disease has been worsening over the past year    Diarrhea 3-4 times a day  For the past 3 weeks, no blood in stool,has cramping right before, takes immodium some days, if he eats fast food, heavy has vomiting. Last episode 1 week ago.  Denies any antibiotics recently . Belches a lot as well  No weight loss noted   Chronic insomnia- takes trazodone at bedtime   GAD- takes ativan daily as needed   Review Past Medical/Family/Social: Per EMR    Risk Factors  Current exercise habits: walks  Dietary issues discussed: YES  Cardiac risk factors: Obesity (BMI >= 30 kg/m2). HTN, Hyperlipidemia ,CAD   Depression Screen  (Note: if answer to either of the following is "Yes", a more complete depression screening is indicated)  Over the past two weeks, have you felt down, depressed or hopeless? No Over the past two weeks, have you felt little interest or pleasure in doing things? No Have you lost interest or pleasure in daily life? No Do you often feel hopeless? No Do you cry easily over simple problems? No   Activities of Daily Living  In your present state of health, do you have any difficulty performing the following activities?:  Driving? No  Managing money? No  Feeding yourself? No  Getting from bed to chair? No  Climbing a flight of stairs? No  Preparing food and eating?: No  Bathing or showering? No  Getting dressed: No  Getting to the toilet? No  Using the toilet:No  Moving around from place to place: No  In the past year have you fallen or had a near fall?:No  Are you sexually active? No  Do you have more than one partner? No   Hearing Difficulties: No  Do you often ask people to speak up or repeat  themselves? No  Do you experience ringing or noises in your ears? No Do you have difficulty understanding soft or whispered voices? No  Do you feel that you have a problem with memory? No Do you often misplace items? No  Do you feel safe at home? Yes  Cognitive Testing  Alert? Yes Normal Appearance?Yes  Oriented to person? Yes Place? Yes  Time? Yes  Recall of three objects? Yes  Can perform simple calculations? Yes  Displays appropriate judgment?Yes  Can read the correct time from a watch face?Yes   List the Names of Other Physician/Practitioners you currently use:   Cardiology   Screening Tests / Date Colonoscopy         UTD            Zostavax  Due  Influenza Vaccine UTD  Tetanus/tdapUTD   ROS: GEN- denies fatigue, fever, weight loss,weakness, recent illness HEENT- denies eye drainage, change in vision, nasal discharge, CVS- denies chest pain, palpitations RESP- denies SOB, cough, wheeze ABD- denies N/V, change in stools, abd pain GU- denies dysuria, hematuria, dribbling, incontinence MSK- denies joint pain, muscle aches, injury Neuro- denies headache, dizziness, syncope, seizure activity  PHYSICAL:weight up 20lbs since visit in May 2017 GEN- NAD, alert and oriented x3 HEENT- PERRL, EOMI, non injected sclera, pink conjunctiva, MMM, oropharynx clear Neck- Supple, no thryomegaly CVS- Irregular  rhythem,  no murmur RESP-CTAB ABD-NABS,soft,NT,ND  EXT- No edema Pulses- Radial, DP- 2+    Assessment:    Annual wellness medicare exam   Plan:    During the course of the visit the patient was educated and counseled about appropriate screening and preventive services including:  Discussed HEP C and HIV screening- Done  PSA screening to be done   Shingles vaccine. Prescription given to that she can get the vaccine at the pharmacy or Medicare part D.  Screen Neg for depression.   Diarrhea- Unknown cause but he does have history of enteritis due to C. difficile in the  past that he has not had any recent antibiotics. Obtain stool cultures on him. He is having some pain and diarrhea feeding support possible gallbladder etiology or pancreatitis O obtain a lipase and metabolic panel. We will go ahead and proceed with scanning him as he is a very complex history and this may be difficult to tease out. I've also given him some Zofran to use with the nausea and he is to keep himself hydrated. Unfortunately he also has chronic kidney disease with a creatinine greater than 2, he will be referred to nephrology  Counsled on tobacco cessation he continues to smoke at least 2-3 cigarettes a day    Diet review for nutrition referral? Yes ____ Not Indicated __x__  Patient Instructions (the written plan) was given to the patient.  Medicare Attestation  I have personally reviewed:  The patient's medical and social history  Their use of alcohol, tobacco or illicit drugs  Their current medications and supplements  The patient's functional ability including ADLs,fall risks, home safety risks, cognitive, and hearing and visual impairment  Diet and physical activities  Evidence for depression or mood disorders  The patient's weight, height, BMI, and visual acuity have been recorded in the chart. I have made referrals, counseling, and provided education to the patient based on review of the above and I have provided the patient with a written personalized care plan for preventive services.

## 2016-12-28 NOTE — Patient Instructions (Addendum)
Referral to nephrology  Return the stool cards  CT scan to be done F/U 3 months

## 2016-12-29 ENCOUNTER — Other Ambulatory Visit: Payer: Self-pay | Admitting: Family Medicine

## 2016-12-29 ENCOUNTER — Other Ambulatory Visit: Payer: Medicare Other

## 2016-12-29 DIAGNOSIS — R197 Diarrhea, unspecified: Secondary | ICD-10-CM | POA: Diagnosis not present

## 2016-12-29 LAB — COMPREHENSIVE METABOLIC PANEL
ALT: 10 U/L (ref 9–46)
AST: 11 U/L (ref 10–35)
Albumin: 3.3 g/dL — ABNORMAL LOW (ref 3.6–5.1)
Alkaline Phosphatase: 97 U/L (ref 40–115)
BUN: 23 mg/dL (ref 7–25)
CO2: 19 mmol/L — ABNORMAL LOW (ref 20–32)
Calcium: 9.3 mg/dL (ref 8.6–10.3)
Chloride: 108 mmol/L (ref 98–110)
Creat: 2.06 mg/dL — ABNORMAL HIGH (ref 0.70–1.25)
Glucose, Bld: 111 mg/dL — ABNORMAL HIGH (ref 70–99)
Potassium: 4.2 mmol/L (ref 3.5–5.3)
Sodium: 139 mmol/L (ref 135–146)
Total Bilirubin: 0.4 mg/dL (ref 0.2–1.2)
Total Protein: 6.1 g/dL (ref 6.1–8.1)

## 2016-12-29 LAB — LIPID PANEL
Cholesterol: 129 mg/dL (ref <200)
HDL: 30 mg/dL — ABNORMAL LOW (ref >40)
LDL Cholesterol: 47 mg/dL (ref <100)
Total CHOL/HDL Ratio: 4.3 Ratio (ref <5.0)
Triglycerides: 258 mg/dL — ABNORMAL HIGH (ref <150)
VLDL: 52 mg/dL — ABNORMAL HIGH (ref <30)

## 2016-12-29 LAB — HEPATITIS C ANTIBODY: HCV Ab: NONREACTIVE

## 2016-12-29 LAB — HIV ANTIBODY (ROUTINE TESTING W REFLEX): HIV 1&2 Ab, 4th Generation: NONREACTIVE

## 2016-12-29 LAB — LIPASE: Lipase: 42 U/L (ref 7–60)

## 2016-12-29 LAB — PSA: PSA: 0.9 ng/mL (ref <=4.0)

## 2016-12-30 LAB — FECAL GLOBIN BY IMMUNOCHEMISTRY
Fecal Globin Immuno: NOT DETECTED
Fecal Globin Immuno: NOT DETECTED

## 2016-12-30 LAB — CLOSTRIDIUM DIFFICILE BY PCR: Toxigenic C. Difficile by PCR: NOT DETECTED

## 2017-01-02 LAB — STOOL CULTURE

## 2017-01-04 ENCOUNTER — Ambulatory Visit (HOSPITAL_COMMUNITY): Payer: Medicare Other

## 2017-01-05 ENCOUNTER — Telehealth: Payer: Self-pay | Admitting: Family Medicine

## 2017-01-05 NOTE — Telephone Encounter (Signed)
-----   Message from Olena Mater, LPN sent at 07/02/5496 10:14 AM EDT ----- Pt has CT Scan scheduled yesterday but canceled because was at Urgent Care with wife.  I called him today to reschedule and he says he is feeling better and does not feel he needs to have done now.

## 2017-01-05 NOTE — Telephone Encounter (Signed)
noted 

## 2017-01-10 ENCOUNTER — Telehealth: Payer: Self-pay | Admitting: *Deleted

## 2017-01-10 ENCOUNTER — Other Ambulatory Visit: Payer: Self-pay | Admitting: Adult Health

## 2017-01-10 MED ORDER — WARFARIN SODIUM 5 MG PO TABS: ORAL_TABLET | ORAL | 3 refills | Status: DC

## 2017-01-10 NOTE — Telephone Encounter (Signed)
Refill on Warfarin to [facility] / tg

## 2017-01-10 NOTE — Telephone Encounter (Signed)
Done

## 2017-01-12 ENCOUNTER — Ambulatory Visit (INDEPENDENT_AMBULATORY_CARE_PROVIDER_SITE_OTHER): Payer: Medicare Other | Admitting: *Deleted

## 2017-01-12 DIAGNOSIS — Z5181 Encounter for therapeutic drug level monitoring: Secondary | ICD-10-CM

## 2017-01-12 DIAGNOSIS — I4891 Unspecified atrial fibrillation: Secondary | ICD-10-CM

## 2017-01-12 DIAGNOSIS — I482 Chronic atrial fibrillation: Secondary | ICD-10-CM

## 2017-01-12 DIAGNOSIS — I4821 Permanent atrial fibrillation: Secondary | ICD-10-CM

## 2017-01-12 LAB — POCT INR: INR: 2

## 2017-01-19 ENCOUNTER — Other Ambulatory Visit: Payer: Self-pay | Admitting: Family Medicine

## 2017-01-26 DIAGNOSIS — M9905 Segmental and somatic dysfunction of pelvic region: Secondary | ICD-10-CM | POA: Diagnosis not present

## 2017-01-26 DIAGNOSIS — M5137 Other intervertebral disc degeneration, lumbosacral region: Secondary | ICD-10-CM | POA: Diagnosis not present

## 2017-01-27 DIAGNOSIS — M5137 Other intervertebral disc degeneration, lumbosacral region: Secondary | ICD-10-CM | POA: Diagnosis not present

## 2017-01-27 DIAGNOSIS — M9905 Segmental and somatic dysfunction of pelvic region: Secondary | ICD-10-CM | POA: Diagnosis not present

## 2017-02-02 ENCOUNTER — Encounter: Payer: Self-pay | Admitting: *Deleted

## 2017-02-05 ENCOUNTER — Other Ambulatory Visit: Payer: Self-pay | Admitting: Family Medicine

## 2017-02-07 DIAGNOSIS — M5137 Other intervertebral disc degeneration, lumbosacral region: Secondary | ICD-10-CM | POA: Diagnosis not present

## 2017-02-07 DIAGNOSIS — M9905 Segmental and somatic dysfunction of pelvic region: Secondary | ICD-10-CM | POA: Diagnosis not present

## 2017-02-16 DIAGNOSIS — M5137 Other intervertebral disc degeneration, lumbosacral region: Secondary | ICD-10-CM | POA: Diagnosis not present

## 2017-02-16 DIAGNOSIS — M9905 Segmental and somatic dysfunction of pelvic region: Secondary | ICD-10-CM | POA: Diagnosis not present

## 2017-02-17 DIAGNOSIS — M5137 Other intervertebral disc degeneration, lumbosacral region: Secondary | ICD-10-CM | POA: Diagnosis not present

## 2017-02-17 DIAGNOSIS — M9905 Segmental and somatic dysfunction of pelvic region: Secondary | ICD-10-CM | POA: Diagnosis not present

## 2017-02-21 DIAGNOSIS — M9905 Segmental and somatic dysfunction of pelvic region: Secondary | ICD-10-CM | POA: Diagnosis not present

## 2017-02-21 DIAGNOSIS — M5137 Other intervertebral disc degeneration, lumbosacral region: Secondary | ICD-10-CM | POA: Diagnosis not present

## 2017-02-23 DIAGNOSIS — M5137 Other intervertebral disc degeneration, lumbosacral region: Secondary | ICD-10-CM | POA: Diagnosis not present

## 2017-02-23 DIAGNOSIS — M9905 Segmental and somatic dysfunction of pelvic region: Secondary | ICD-10-CM | POA: Diagnosis not present

## 2017-02-24 DIAGNOSIS — M9905 Segmental and somatic dysfunction of pelvic region: Secondary | ICD-10-CM | POA: Diagnosis not present

## 2017-02-24 DIAGNOSIS — M5137 Other intervertebral disc degeneration, lumbosacral region: Secondary | ICD-10-CM | POA: Diagnosis not present

## 2017-02-28 DIAGNOSIS — M9905 Segmental and somatic dysfunction of pelvic region: Secondary | ICD-10-CM | POA: Diagnosis not present

## 2017-02-28 DIAGNOSIS — M5137 Other intervertebral disc degeneration, lumbosacral region: Secondary | ICD-10-CM | POA: Diagnosis not present

## 2017-03-02 DIAGNOSIS — M9905 Segmental and somatic dysfunction of pelvic region: Secondary | ICD-10-CM | POA: Diagnosis not present

## 2017-03-02 DIAGNOSIS — M5137 Other intervertebral disc degeneration, lumbosacral region: Secondary | ICD-10-CM | POA: Diagnosis not present

## 2017-03-16 ENCOUNTER — Encounter: Payer: Self-pay | Admitting: *Deleted

## 2017-03-30 ENCOUNTER — Other Ambulatory Visit: Payer: Self-pay | Admitting: Family Medicine

## 2017-03-30 NOTE — Telephone Encounter (Signed)
Okay to refill? 

## 2017-03-30 NOTE — Telephone Encounter (Signed)
Ok to refill Lorazepam??  Last office visit 12/28/2016.  Last refill 11/02/2016, #2 refills.

## 2017-03-31 NOTE — Telephone Encounter (Signed)
Medication called to pharmacy. 

## 2017-04-08 ENCOUNTER — Other Ambulatory Visit: Payer: Self-pay | Admitting: *Deleted

## 2017-04-08 MED ORDER — LISINOPRIL 40 MG PO TABS: 40.0000 mg | ORAL_TABLET | Freq: Every day | ORAL | 0 refills | Status: DC

## 2017-05-10 ENCOUNTER — Other Ambulatory Visit: Payer: Self-pay | Admitting: Family Medicine

## 2017-05-12 ENCOUNTER — Encounter: Payer: Self-pay | Admitting: Cardiology

## 2017-05-12 ENCOUNTER — Ambulatory Visit (INDEPENDENT_AMBULATORY_CARE_PROVIDER_SITE_OTHER): Payer: Medicare Other | Admitting: Cardiology

## 2017-05-12 VITALS — BP 170/82 | HR 72 | Ht 72.0 in | Wt 184.0 lb

## 2017-05-12 DIAGNOSIS — I10 Essential (primary) hypertension: Secondary | ICD-10-CM

## 2017-05-12 DIAGNOSIS — E782 Mixed hyperlipidemia: Secondary | ICD-10-CM | POA: Diagnosis not present

## 2017-05-12 DIAGNOSIS — I5022 Chronic systolic (congestive) heart failure: Secondary | ICD-10-CM | POA: Diagnosis not present

## 2017-05-12 DIAGNOSIS — I714 Abdominal aortic aneurysm, without rupture, unspecified: Secondary | ICD-10-CM

## 2017-05-12 DIAGNOSIS — I4891 Unspecified atrial fibrillation: Secondary | ICD-10-CM

## 2017-05-12 DIAGNOSIS — I2510 Atherosclerotic heart disease of native coronary artery without angina pectoris: Secondary | ICD-10-CM | POA: Diagnosis not present

## 2017-05-12 MED ORDER — HYDRALAZINE HCL 25 MG PO TABS: 25.0000 mg | ORAL_TABLET | Freq: Three times a day (TID) | ORAL | 3 refills | Status: DC

## 2017-05-12 NOTE — Progress Notes (Signed)
Clinical Summary [name] [name] is a 67 y.o.male seen today for follow up of the following medical problems.   1. Afib  - no recent palpitations - no bleeding on coumadin  2. CAD  - no chest pain, no SOB/DOE  3. HTN - remains compliant with meds  4. PAD - s/p repair of juxtarenal abdominal aortic aneurysm, left renal artery bypass, followed by vascular - has not followed up with vascular recently  5. Chronic sysotlic HF - ehco 10/1605 LVEF 35-40% -no recent symptoms.    6. HL - 12/2016 TC 129 TG 258 HDL 30 LDL 47 - compliant with statin   Past Medical History:  Diagnosis Date  . AAA (abdominal aortic aneurysm) ([place])    a. s/p repair 06/2015 with left renal artery bypass at that time, post-op course c/b renal failure requiring dialysis, C dif.  . Anemia   . Arteriosclerotic cardiovascular disease (ASCVD)    a. AMI in 2000 treated at [facility]. b. cath in 12/2006->  Chronic total obstruction of the RCA;  drug-eluting stent placed in OM1, LVEF abnormal.  . Cerebrovascular disease 2002   carotid stent  . Chronic anticoagulation   . Chronic combined systolic and diastolic CHF (congestive heart failure) ([place])   . CKD (chronic kidney disease), stage IV ([place])   . GERD (gastroesophageal reflux disease)   . Hyperlipidemia   . Hypertension   . Myocardial infarction ([place]) 10 yrs ago  . Nephrolithiasis   . Permanent atrial fibrillation ([place])   . PVD (peripheral vascular disease) ([place])    Ct angiogram in 2009 revealed stable disease with 80% celiac stenosis,50% right renal artery ,ASCVD with ulceration in the abdominal aortashe  . Testicular carcinoma ([place]) 1990   right orchiectomy  . Tobacco abuse, in remission    20 pack years; quit in 2009     No Known Allergies   Current Outpatient Medications  Medication Sig Dispense Refill  . albuterol (PROVENTIL HFA;VENTOLIN HFA) 108 (90 BASE) MCG/ACT inhaler Inhale 2-4 puffs into the lungs every 4 (four) hours as needed for  wheezing or shortness of breath. 1 Inhaler 0  . amLODipine (NORVASC) 10 MG tablet TAKE 1 TABLET BY MOUTH ONCE DAILY 90 tablet 0  . atorvastatin (LIPITOR) 20 MG tablet TAKE 1 TABLET BY MOUTH AT BEDTIME. 30 tablet 3  . diphenhydrAMINE (BENADRYL) 25 MG tablet Take 25 mg by mouth daily as needed for allergies.    . ferrous sulfate 325 (65 FE) MG tablet TAKE 1 TABLET BY MOUTH THREE TIMES DAILY WITH FOOD 90 tablet 2  . fexofenadine (ALLEGRA) 180 MG tablet Take 1 tablet (180 mg total) by mouth daily. 30 tablet 2  . hydrALAZINE (APRESOLINE) 10 MG tablet Take 1 tablet (10 mg total) by mouth 3 (three) times daily. 270 tablet 3  . lisinopril (PRINIVIL,ZESTRIL) 40 MG tablet Take 1 tablet (40 mg total) by mouth daily. 90 tablet 0  . LORazepam (ATIVAN) 0.5 MG tablet TAKE 1 TABLET BY MOUTH TWICE DAILY AS NEEDED FOR ANXIETY **MUST  LAST  30  DAYS** 45 tablet 2  . metoprolol succinate (TOPROL XL) 100 MG 24 hr tablet Take 1 and 1/2 tablets (150 mg ) daily 135 tablet 3  . Multiple Vitamin (ONE-A-DAY MENS PO) Take 1 tablet by mouth daily.    . nitroGLYCERIN (NITROSTAT) 0.4 MG SL tablet place 1 tablet under the tongue if needed every 5 minutes for chest pain for 3 doses IF NO RELIEF AFTER 3RD DOSE CALL PRESCRIBER  OR 911. 25 tablet 2  . Nutritional Supplements (FEEDING SUPPLEMENT, NEPRO CARB STEADY,) LIQD Take 237 mLs by mouth 2 (two) times daily between meals.  0  . Omega-3 Fatty Acids (FISH OIL) 1000 MG CAPS Take 1,000 mg by mouth daily.   0  . ondansetron (ZOFRAN ODT) 4 MG disintegrating tablet Take 1 tablet (4 mg total) by mouth every 8 (eight) hours as needed for nausea or vomiting. 20 tablet 0  . pantoprazole (PROTONIX) 40 MG tablet TAKE 1 TABLET BY MOUTH ONCE DAILY 60 tablet 0  . sodium bicarbonate 650 MG tablet TAKE 1 TABLET BY MOUTH BY MOUTH THREE TIMES DAILY 90 tablet 1  . tamsulosin (FLOMAX) 0.4 MG CAPS capsule TAKE 1 CAPSULE BY MOUTH ONCE DAILY AFTER SUPPER 60 capsule 0  . traZODone (DESYREL) 50 MG tablet  TAKE 1 TABLET BY MOUTH AT BEDTIME AS NEEDED FOR SLEEP 90 tablet 0  . warfarin (COUMADIN) 5 MG tablet Take 1 1/2 tablets daily except 1 tablet on Mondays, Wednesdays and Fridays 45 tablet 3   No current facility-administered medications for this visit.      Past Surgical History:  Procedure Laterality Date  . ABDOMINAL AORTIC ANEURYSM REPAIR N/A 06/09/2015   Procedure: ANEURYSM ABDOMINAL AORTIC REPAIR;  Surgeon: Elam Dutch, MD;  Location: [facility] OR;  Service: Vascular;  Laterality: N/A;  . AORTIC ENDARTERECETOMY N/A 06/09/2015   Procedure: AORTIC ENDARTERECETOMY;  Surgeon: Elam Dutch, MD;  Location: [place];  Service: Vascular;  Laterality: N/A;  . AORTIC/RENAL BYPASS Left 06/09/2015   Procedure: LEFT RENAL Artery BYPASS;  Surgeon: Elam Dutch, MD;  Location: [place];  Service: Vascular;  Laterality: Left;  . APPENDECTOMY  2004  . COLONOSCOPY  06/17/2011   INCOMPLETE, PREP POOR. Procedure: COLONOSCOPY;  Surgeon: Daneil Dolin, MD;  [facility];  Service: Endoscopy;  Laterality: N/A;  10:00  . COLONOSCOPY  07/15/2011   LOV:FIEPPIRJ rectal and colon polyps  . COLONOSCOPY N/A 05/22/2015   JOA:[id number] colonic and rectal polyps. tubular adenomas.repeat TCS 05/2018  . ESOPHAGEAL DILATION N/A 05/22/2015   Procedure: ESOPHAGEAL DILATION;  Surgeon: Daneil Dolin, MD;  [facility];  Service: Endoscopy;  Laterality: N/A;  . ESOPHAGOGASTRODUODENOSCOPY  06/17/2011   severe erosive/ulcerative reflux esophagitis, soft noncritical stricture dilatied, small hh, antral erosion   . ESOPHAGOGASTRODUODENOSCOPY N/A 05/22/2015   RMR: 2 cm HH otherwise normal. s/p empirical dilation.  . INSERTION OF DIALYSIS CATHETER Left 06/26/2015   Procedure: INSERTION OF DIALYSIS CATHETER;  Surgeon: Angelia Mould, MD;  Location: [place];  Service: Vascular;  Laterality: Left;  . PERIPHERAL VASCULAR CATHETERIZATION N/A 05/28/2015   Procedure: Abdominal Aortogram;  Surgeon: Serafina Mitchell, MD;   Location: [place] [facility];  Service: Cardiovascular;  Laterality: N/A;  . testicular cancer  1990   right orchiectomy     No Known Allergies    Family History  Problem Relation Age of Onset  . Colon cancer Father 64       deceased  . Prostate cancer Father   . Liver disease Neg Hx   . Anesthesia problems Neg Hx   . Hypotension Neg Hx   . Malignant hyperthermia Neg Hx   . Pseudochol deficiency Neg Hx      Social History [name] [name] reports that he has been smoking cigarettes.  He has a 4.00 pack-year smoking history. he has never used smokeless tobacco. [name] [name] reports that he does not drink alcohol.   Review of  Systems CONSTITUTIONAL: No weight loss, fever, chills, weakness or fatigue.  HEENT: Eyes: No visual loss, blurred vision, double vision or yellow sclerae.No hearing loss, sneezing, congestion, runny nose or sore throat.  SKIN: No rash or itching.  CARDIOVASCULAR: per hpi RESPIRATORY: No shortness of breath, cough or sputum.  GASTROINTESTINAL: No anorexia, nausea, vomiting or diarrhea. No abdominal pain or blood.  GENITOURINARY: No burning on urination, no polyuria NEUROLOGICAL: No headache, dizziness, syncope, paralysis, ataxia, numbness or tingling in the extremities. No change in bowel or bladder control.  MUSCULOSKELETAL: No muscle, back pain, joint pain or stiffness.  LYMPHATICS: No enlarged nodes. No history of splenectomy.  PSYCHIATRIC: No history of depression or anxiety.  ENDOCRINOLOGIC: No reports of sweating, cold or heat intolerance. No polyuria or polydipsia.  [name]   Physical Examination Vitals:   05/12/17 1355  BP: (!) 170/82  Pulse: 72  SpO2: 99%   Vitals:   05/12/17 1355  Weight: 184 lb (83.5 kg)  Height: 6' (1.829 m)    Gen: resting comfortably, no acute distress HEENT: no scleral icterus, pupils equal round and reactive, no palptable cervical adenopathy,  CV: RRR, no m/r/,g no jvd Resp: Clear to auscultation bilaterally GI: abdomen  is soft, non-tender, non-distended, normal bowel sounds, no hepatosplenomegaly MSK: extremities are warm, no edema.  Skin: warm, no rash Neuro:  no focal deficits Psych: appropriate affect   Diagnostic Studies 06/2015 echo - Left ventricle: The cavity size was normal. Systolic function was  moderately reduced. The estimated ejection fraction was in the  range of 35% to 40%. - Pulmonary arteries: PA peak pressure: 33 mm Hg (S). - Inferior vena cava: The vessel was dilated. The respirophasic  diameter changes were blunted (<50%). This is a nonspecific  finding during positive pressure ventilation.  Impressions:  - Technically limited study due to poor echo windows and  tachycardia. Consider TEE if more accurate evaluation is  necessary.    Assessment and Plan  1. Afib - CHADS2Vasc score is 3, continue anticoagulation - no symptoms, continue current meds  2. CAD - no recent symptoms, continue currnet meds  3. HTN - elevated in clinic - increase hydralzine to 25mg  tid  4. Chronic systolic HF -asymptomatic, conintue current meds - repeat echo to reevaluate LV function, pending results further medication titraiton may be required.   5. PAD - reestablish with vascular    F/u 2 months      Arnoldo Lenis, M.D., F.A.C.C.

## 2017-05-12 NOTE — Patient Instructions (Signed)
Medication Instructions:  INCREASE HYDRALAZINE TO 25 MG THREE TIMES DAILY   Labwork: NONE  Testing/Procedures: Your physician has requested that you have an echocardiogram. Echocardiography is a painless test that uses sound waves to create images of your heart. It provides your doctor with information about the size and shape of your heart and how well your heart's chambers and valves are working. This procedure takes approximately one hour. There are no restrictions for this procedure.    Follow-Up: Your physician recommends that you schedule a follow-up appointment in: 2 MONTHS    Any Other Special Instructions Will Be Listed Below (If Applicable).     If you need a refill on your cardiac medications before your next appointment, please call your pharmacy.

## 2017-05-13 ENCOUNTER — Other Ambulatory Visit: Payer: Self-pay

## 2017-05-13 DIAGNOSIS — I714 Abdominal aortic aneurysm, without rupture, unspecified: Secondary | ICD-10-CM

## 2017-05-13 DIAGNOSIS — I722 Aneurysm of renal artery: Secondary | ICD-10-CM

## 2017-05-13 DIAGNOSIS — Z9889 Other specified postprocedural states: Principal | ICD-10-CM

## 2017-05-13 DIAGNOSIS — Z8679 Personal history of other diseases of the circulatory system: Secondary | ICD-10-CM

## 2017-05-16 ENCOUNTER — Encounter: Payer: Self-pay | Admitting: Cardiology

## 2017-05-19 ENCOUNTER — Ambulatory Visit (HOSPITAL_COMMUNITY)
Admission: RE | Admit: 2017-05-19 | Discharge: 2017-05-19 | Disposition: A | Discharge status: Home / Self Care | Payer: Medicare Other | Source: Ambulatory Visit | Attending: Cardiology | Admitting: Cardiology

## 2017-05-19 DIAGNOSIS — I5022 Chronic systolic (congestive) heart failure: Secondary | ICD-10-CM | POA: Diagnosis not present

## 2017-05-19 DIAGNOSIS — I4891 Unspecified atrial fibrillation: Secondary | ICD-10-CM | POA: Diagnosis not present

## 2017-05-19 DIAGNOSIS — I340 Nonrheumatic mitral (valve) insufficiency: Secondary | ICD-10-CM | POA: Insufficient documentation

## 2017-05-19 NOTE — Progress Notes (Signed)
*  PRELIMINARY RESULTS* Echocardiogram 2D Echocardiogram has been performed.  [name] [name] 05/19/2017, 2:03 PM

## 2017-05-21 ENCOUNTER — Other Ambulatory Visit: Payer: Self-pay | Admitting: Family Medicine

## 2017-05-23 ENCOUNTER — Telehealth: Payer: Self-pay | Admitting: Cardiology

## 2017-05-23 NOTE — Telephone Encounter (Signed)
Wanted to know results from Echo

## 2017-05-23 NOTE — Telephone Encounter (Signed)
I will forward to Dr Branch 

## 2017-06-13 ENCOUNTER — Other Ambulatory Visit: Payer: Self-pay | Admitting: Cardiology

## 2017-06-23 ENCOUNTER — Ambulatory Visit (INDEPENDENT_AMBULATORY_CARE_PROVIDER_SITE_OTHER): Payer: Medicare Other | Admitting: Vascular Surgery

## 2017-06-23 ENCOUNTER — Encounter: Payer: Self-pay | Admitting: Vascular Surgery

## 2017-06-23 ENCOUNTER — Ambulatory Visit (HOSPITAL_COMMUNITY)
Admission: RE | Admit: 2017-06-23 | Discharge: 2017-06-23 | Disposition: A | Discharge status: Home / Self Care | Payer: Medicare Other | Source: Ambulatory Visit | Attending: Vascular Surgery | Admitting: Vascular Surgery

## 2017-06-23 VITALS — BP 165/87 | HR 68 | Temp 96.8°F | Resp 21 | Ht 72.0 in | Wt 186.6 lb

## 2017-06-23 DIAGNOSIS — I714 Abdominal aortic aneurysm, without rupture, unspecified: Secondary | ICD-10-CM

## 2017-06-23 DIAGNOSIS — Z8679 Personal history of other diseases of the circulatory system: Secondary | ICD-10-CM | POA: Diagnosis not present

## 2017-06-23 DIAGNOSIS — Z9889 Other specified postprocedural states: Secondary | ICD-10-CM | POA: Diagnosis not present

## 2017-06-23 DIAGNOSIS — I722 Aneurysm of renal artery: Secondary | ICD-10-CM | POA: Insufficient documentation

## 2017-06-23 DIAGNOSIS — I701 Atherosclerosis of renal artery: Secondary | ICD-10-CM | POA: Diagnosis present

## 2017-06-23 DIAGNOSIS — N184 Chronic kidney disease, stage 4 (severe): Secondary | ICD-10-CM

## 2017-06-23 NOTE — Progress Notes (Signed)
Referring Physician: Dr Harl Bowie  Patient name: [name] [name] MRN: [id number] DOB: 1950/05/06 Sex: male  REASON FOR CONSULT: history of renal bypass AAA repair  HPI: [name] [name] is a 67 y.o. male with a history of repair of juxtarenal abdominal aortic aneurysm and left renal artery bypass in 2017.  Since that time he has essentially returned to normal activities.  He still enjoys kayaking in the spring and summer months.  He is currently learning how to play the guitar.  He denies any abdominal or back pain.  He states that his renal function has been stable.  He is not currently seeing a nephrologist.  His last serum creatinine available for my review was August 2018 which was 2.06.  He reports his blood pressure has been okay.  Other medical problems include coronary artery disease, atrial fibrillation, CKD 4, hyperlipidemia, hypertension all of which have been currently stable.  Past Medical History:  Diagnosis Date  . AAA (abdominal aortic aneurysm) ([place])    a. s/p repair 06/2015 with left renal artery bypass at that time, post-op course c/b renal failure requiring dialysis, C dif.  . Anemia   . Arteriosclerotic cardiovascular disease (ASCVD)    a. AMI in 2000 treated at [facility]. b. cath in 12/2006->  Chronic total obstruction of the RCA;  drug-eluting stent placed in OM1, LVEF abnormal.  . Cerebrovascular disease 2002   carotid stent  . Chronic anticoagulation   . Chronic combined systolic and diastolic CHF (congestive heart failure) ([place])   . CKD (chronic kidney disease), stage IV ([place])   . GERD (gastroesophageal reflux disease)   . Hyperlipidemia   . Hypertension   . Myocardial infarction ([place]) 10 yrs ago  . Nephrolithiasis   . Permanent atrial fibrillation ([place])   . PVD (peripheral vascular disease) ([place])    Ct angiogram in 2009 revealed stable disease with 80% celiac stenosis,50% right renal artery ,ASCVD with ulceration in the abdominal aortashe  . Testicular carcinoma  ([place]) 1990   right orchiectomy  . Tobacco abuse, in remission    20 pack years; quit in 2009   Past Surgical History:  Procedure Laterality Date  . ABDOMINAL AORTIC ANEURYSM REPAIR N/A 06/09/2015   Procedure: ANEURYSM ABDOMINAL AORTIC REPAIR;  Surgeon: Elam Dutch, MD;  Location: [facility] OR;  Service: Vascular;  Laterality: N/A;  . AORTIC ENDARTERECETOMY N/A 06/09/2015   Procedure: AORTIC ENDARTERECETOMY;  Surgeon: Elam Dutch, MD;  Location: [place];  Service: Vascular;  Laterality: N/A;  . AORTIC/RENAL BYPASS Left 06/09/2015   Procedure: LEFT RENAL Artery BYPASS;  Surgeon: Elam Dutch, MD;  Location: [place];  Service: Vascular;  Laterality: Left;  . APPENDECTOMY  2004  . COLONOSCOPY  06/17/2011   INCOMPLETE, PREP POOR. Procedure: COLONOSCOPY;  Surgeon: Daneil Dolin, MD;  [facility];  Service: Endoscopy;  Laterality: N/A;  10:00  . COLONOSCOPY  07/15/2011   DHR:CBULAGTX rectal and colon polyps  . COLONOSCOPY N/A 05/22/2015   MIW:OEHOZYYQ colonic and rectal polyps. tubular adenomas.repeat TCS 05/2018  . ESOPHAGEAL DILATION N/A 05/22/2015   Procedure: ESOPHAGEAL DILATION;  Surgeon: Daneil Dolin, MD;  [facility];  Service: Endoscopy;  Laterality: N/A;  . ESOPHAGOGASTRODUODENOSCOPY  06/17/2011   severe erosive/ulcerative reflux esophagitis, soft noncritical stricture dilatied, small hh, antral erosion   . ESOPHAGOGASTRODUODENOSCOPY N/A 05/22/2015   RMR: 2 cm HH otherwise normal. s/p empirical dilation.  . INSERTION OF DIALYSIS CATHETER Left 06/26/2015   Procedure: INSERTION OF  DIALYSIS CATHETER;  Surgeon: Angelia Mould, MD;  Location: [place];  Service: Vascular;  Laterality: Left;  . PERIPHERAL VASCULAR CATHETERIZATION N/A 05/28/2015   Procedure: Abdominal Aortogram;  Surgeon: Serafina Mitchell, MD;  Location: [place] [facility];  Service: Cardiovascular;  Laterality: N/A;  . testicular cancer  1990   right orchiectomy    Family History  Problem Relation Age  of Onset  . Colon cancer Father 76       deceased  . Prostate cancer Father   . Heart disease Mother   . Liver disease Neg Hx   . Anesthesia problems Neg Hx   . Hypotension Neg Hx   . Malignant hyperthermia Neg Hx   . Pseudochol deficiency Neg Hx     SOCIAL HISTORY: Social History   Socioeconomic History  . Marital status: Married    Spouse name: Not on file  . Number of children: 2  . Years of education: Not on file  . Highest education level: Not on file  Social Needs  . Financial resource strain: Not on file  . Food insecurity - worry: Not on file  . Food insecurity - inability: Not on file  . Transportation needs - medical: Not on file  . Transportation needs - non-medical: Not on file  Occupational History  . Occupation: disabled    Fish farm manager: UNEMPLOYED  Tobacco Use  . Smoking status: Current Some Day Smoker    Packs/day: 0.10    Years: 40.00    Pack years: 4.00    Types: Cigarettes    Last attempt to quit: 05/24/2015    Years since quitting: 2.0  . Smokeless tobacco: Never Used  . Tobacco comment: 1/2 pack daily  Substance and Sexual Activity  . Alcohol use: No    Alcohol/week: 0.0 oz  . Drug use: No  . Sexual activity: Yes    Birth control/protection: None  Other Topics Concern  . Not on file  Social History Narrative  . Not on file    No Known Allergies  Current Outpatient Medications  Medication Sig Dispense Refill  . albuterol (PROVENTIL HFA;VENTOLIN HFA) 108 (90 BASE) MCG/ACT inhaler Inhale 2-4 puffs into the lungs every 4 (four) hours as needed for wheezing or shortness of breath. 1 Inhaler 0  . amLODipine (NORVASC) 10 MG tablet TAKE 1 TABLET BY MOUTH ONCE DAILY 90 tablet 0  . atorvastatin (LIPITOR) 20 MG tablet TAKE 1 TABLET BY MOUTH AT BEDTIME. 30 tablet 3  . diphenhydrAMINE (BENADRYL) 25 MG tablet Take 25 mg by mouth daily as needed for allergies.    . ferrous sulfate 325 (65 FE) MG tablet TAKE 1 TABLET BY MOUTH THREE TIMES DAILY WITH FOOD 90  tablet 2  . fexofenadine (ALLEGRA) 180 MG tablet Take 1 tablet (180 mg total) by mouth daily. 30 tablet 2  . hydrALAZINE (APRESOLINE) 25 MG tablet Take 1 tablet (25 mg total) by mouth 3 (three) times daily. 270 tablet 3  . lisinopril (PRINIVIL,ZESTRIL) 40 MG tablet Take 1 tablet (40 mg total) by mouth daily. 90 tablet 0  . LORazepam (ATIVAN) 0.5 MG tablet TAKE 1 TABLET BY MOUTH TWICE DAILY AS NEEDED FOR ANXIETY **MUST  LAST  30  DAYS** 45 tablet 2  . metoprolol succinate (TOPROL XL) 100 MG 24 hr tablet Take 1 and 1/2 tablets (150 mg ) daily 135 tablet 3  . Multiple Vitamin (ONE-A-DAY MENS PO) Take 1 tablet by mouth daily.    . nitroGLYCERIN (NITROSTAT) 0.4 MG SL  tablet place 1 tablet under the tongue if needed every 5 minutes for chest pain for 3 doses IF NO RELIEF AFTER 3RD DOSE CALL PRESCRIBER OR 911. 25 tablet 2  . Nutritional Supplements (FEEDING SUPPLEMENT, NEPRO CARB STEADY,) LIQD Take 237 mLs by mouth 2 (two) times daily between meals.  0  . Omega-3 Fatty Acids (FISH OIL) 1000 MG CAPS Take 1,000 mg by mouth daily.   0  . ondansetron (ZOFRAN ODT) 4 MG disintegrating tablet Take 1 tablet (4 mg total) by mouth every 8 (eight) hours as needed for nausea or vomiting. 20 tablet 0  . pantoprazole (PROTONIX) 40 MG tablet TAKE 1 TABLET BY MOUTH ONCE DAILY 60 tablet 0  . sodium bicarbonate 650 MG tablet TAKE 1 TABLET BY MOUTH THREE TIMES DAILY 90 tablet 0  . tamsulosin (FLOMAX) 0.4 MG CAPS capsule TAKE 1 CAPSULE BY MOUTH ONCE DAILY AFTER SUPPER 60 capsule 0  . traZODone (DESYREL) 50 MG tablet TAKE 1 TABLET BY MOUTH AT BEDTIME AS NEEDED FOR SLEEP 90 tablet 0  . warfarin (COUMADIN) 5 MG tablet Take 1 1/2 tablets daily except 1 tablet on Mondays, Wednesdays and Fridays 45 tablet 3  . warfarin (COUMADIN) 5 MG tablet Take 1 1/2 tablets daily except 1 tablet on Mondays, Wednesdays and Fridays 40 tablet 0   No current facility-administered medications for this visit.     ROS:   General:  No weight loss,  Fever, chills  HEENT: No recent headaches, no nasal bleeding, no visual changes, no sore throat  Neurologic: No dizziness, blackouts, seizures. No recent symptoms of stroke or mini- stroke. No recent episodes of slurred speech, or temporary blindness.  Cardiac: No recent episodes of chest pain/pressure, no shortness of breath at rest.  No shortness of breath with exertion.  + history of atrial fibrillation or irregular heartbeat  Vascular: No history of rest pain in feet.  No history of claudication.  No history of non-healing ulcer, No history of DVT   Pulmonary: No home oxygen, no productive cough, no hemoptysis,  No asthma or wheezing  Musculoskeletal:  [ ]  Arthritis, [ ]  Low back pain,  [ ]  Joint pain  Hematologic:No history of hypercoagulable state.  No history of easy bleeding.  No history of anemia  Gastrointestinal: No hematochezia or melena,  No gastroesophageal reflux, no trouble swallowing  Urinary: [X]  chronic Kidney disease, [ ]  on HD - [ ]  MWF or [ ]  TTHS, [ ]  Burning with urination, [ ]  Frequent urination, [ ]  Difficulty urinating;   Skin: No rashes  Psychological: No history of anxiety,  No history of depression   Physical Examination  Vitals:   06/23/17 0836 06/23/17 0839  BP: (!) 160/88 (!) 165/87  Pulse: 68   Resp: (!) 21   Temp: (!) 96.8 F (36 C)   TempSrc: Oral   SpO2: 99%   Weight: 186 lb 9.6 oz (84.6 kg)   Height: 6' (1.829 m)     Body mass index is 25.31 kg/m.  General:  Alert and oriented, no acute distress HEENT: Normal Neck: No bruit or JVD Pulmonary: Clear to auscultation bilaterally Cardiac: Regular Rate and Rhythm without murmur Abdomen: Soft, non-tender, non-distended, no mass Skin: No rash Extremity Pulses:  2+ radial, brachial, femoral, dorsalis pedis, absent posterior tibial pulses bilaterally Musculoskeletal: No deformity 1+ pretibial edema bilaterally Neurologic: Upper and lower extremity motor 5/5 and symmetric  DATA:    Patient had an aortic ultrasound which showed a patent graft and a  patent left renal artery bypass with a kidney length on the left side of 12.82 cm.  There is no evidence of narrowing.  ASSESSMENT: Doing well status post juxtarenal aneurysm repair as well as left renal artery bypass.  Currently stable renal function.   PLAN: Follow-up in 5 years with our nurse practitioner.  Consideration for a follow-up CT scan in 2027 to recheck his aortic graft.   Ruta Hinds, MD [facility] Office: [phone] Pager: [phone]

## 2017-07-04 IMAGING — CT CT ABD-PELV W/ CM
2 of 4 series · 15 of 46 positions shown, 17 images · IV contrast (Omnipaque 300)
Comparison: CT dated 12/19/2007

CLINICAL DATA: 62-year-old male with abdominal pain. Status post
colonoscopy.

EXAM:
CT ABDOMEN AND PELVIS WITH CONTRAST
TECHNIQUE: Multidetector CT imaging of the abdomen and pelvis was performed
using the standard protocol following bolus administration of
intravenous contrast.
CONTRAST:  100 cc Omnipaque 300

[Series 2: abd_pel_with 5.0 b40f · axial · 0.74mm/px · z∈[-580,-154]mm · 12 of 95 slices shown, 14 images]
[im 5/95  soft-tissue]
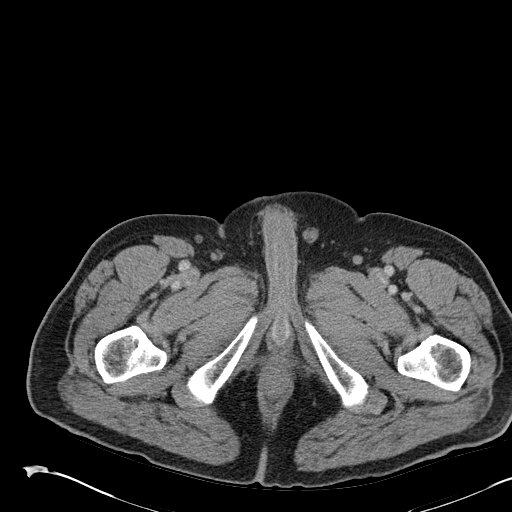
[im 5/95  bone]
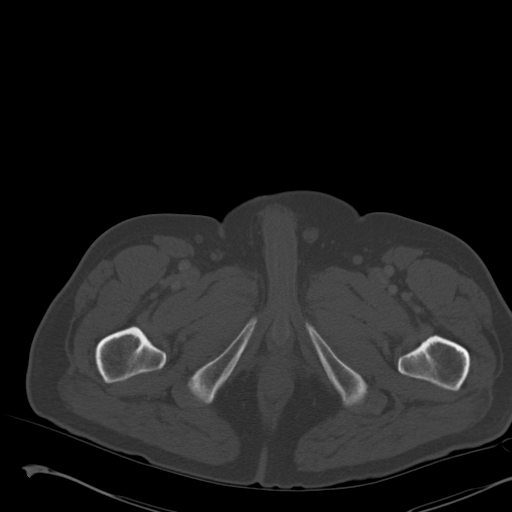
[im 15/95  soft-tissue]
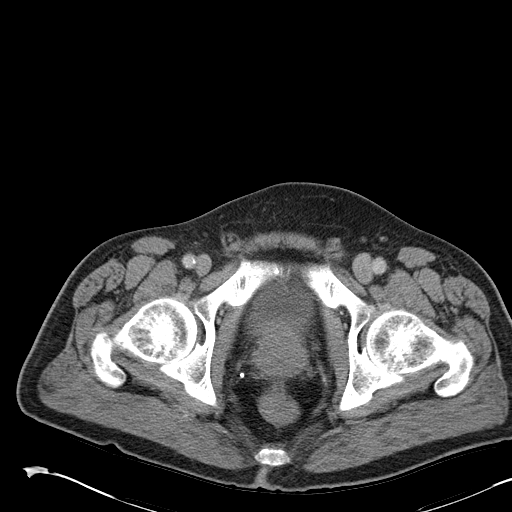
[im 20/95  soft-tissue]
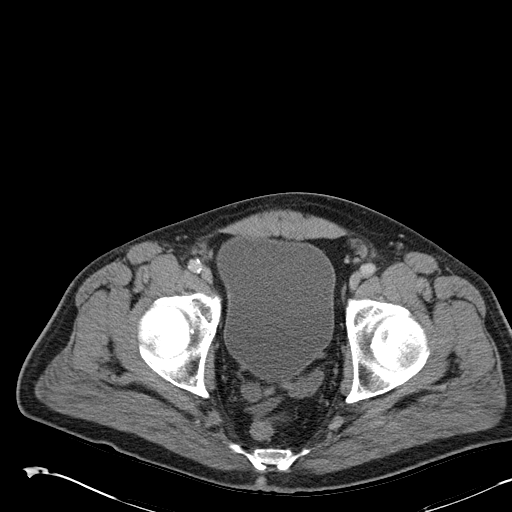
[im 30/95  soft-tissue]
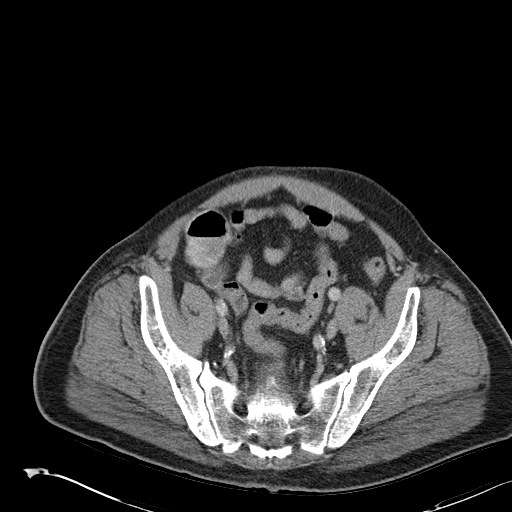
[im 35/95  soft-tissue]
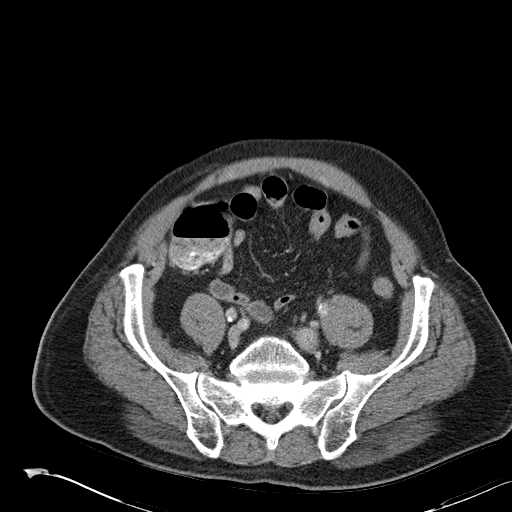
[im 45/95  soft-tissue]
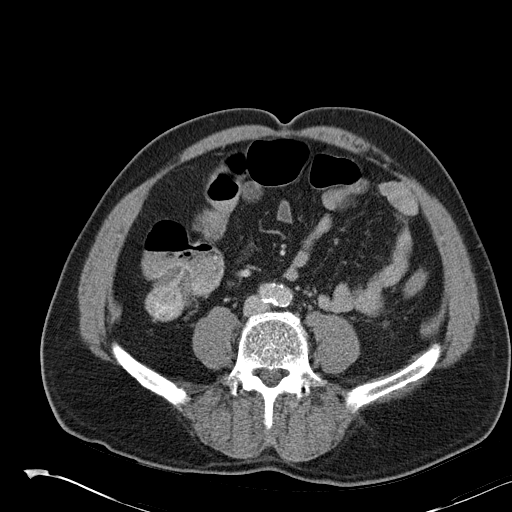
[im 50/95  soft-tissue]
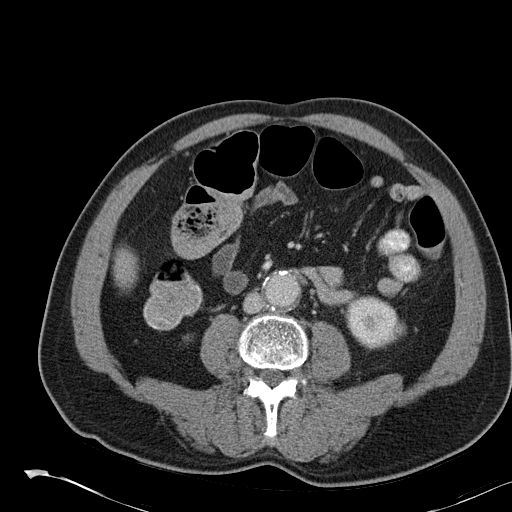
[im 60/95  soft-tissue]
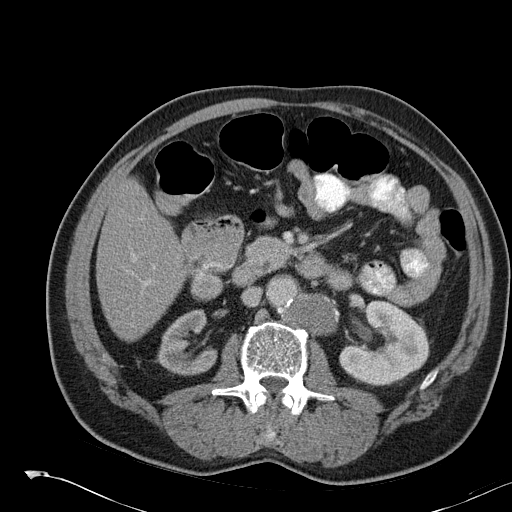
[im 65/95  soft-tissue]
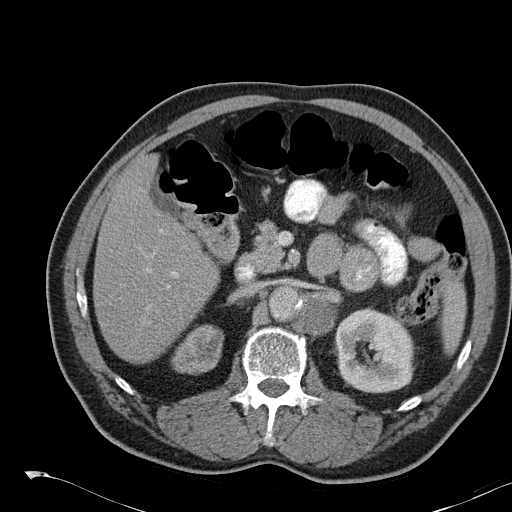
[im 65/95  bone]
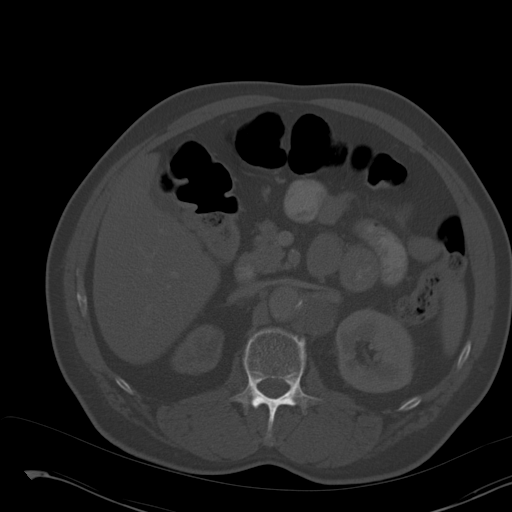
[im 75/95  soft-tissue]
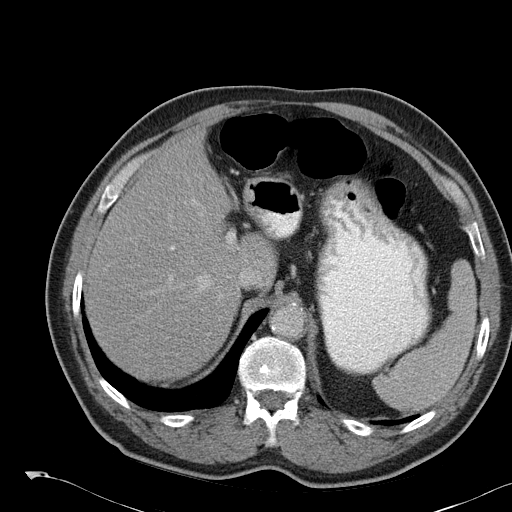
[im 80/95  soft-tissue]
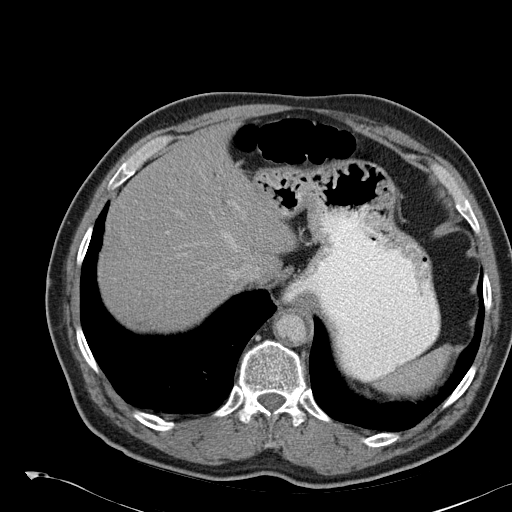
[im 90/95  soft-tissue]
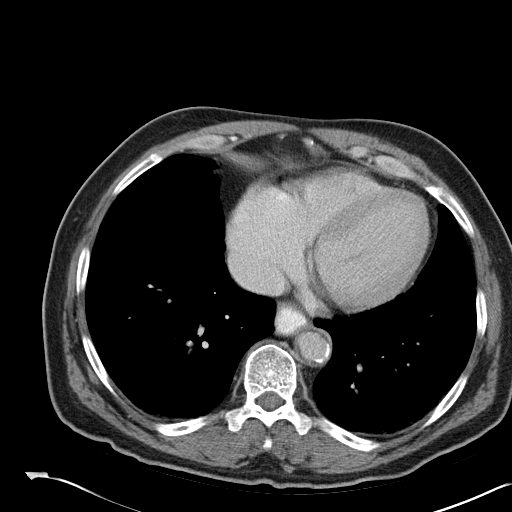

[Series 3: abd_pel_with 3.0 spo cor · coronal · 0.74mm/px · 3 of 109 slices shown]
[im 37/109  soft-tissue]
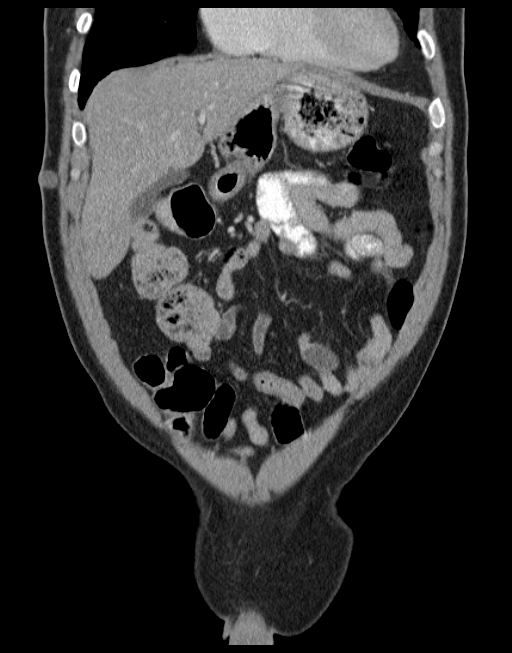
[im 49/109  soft-tissue]
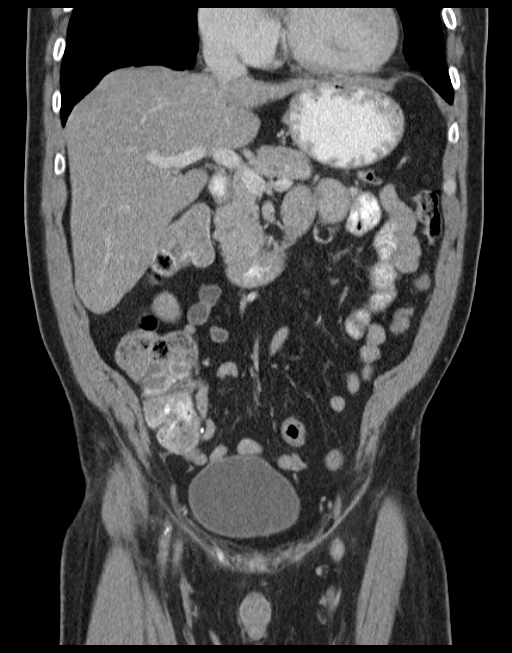
[im 61/109  soft-tissue]
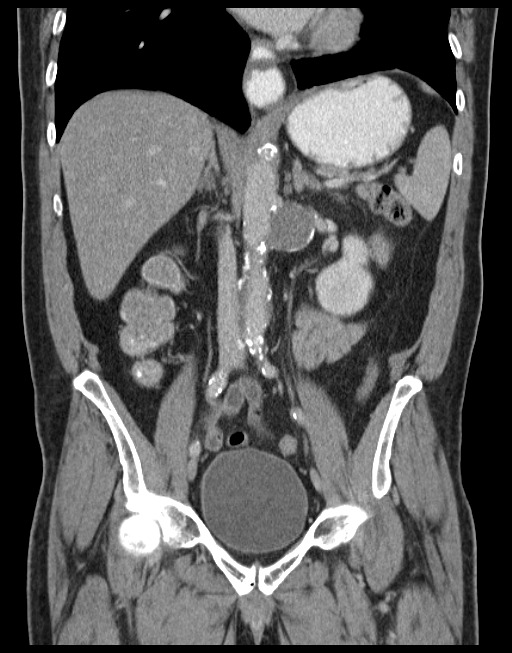

[15 of 46 positions shown; findings below may reference images not displayed]

FINDINGS: The visualized lung bases are clear. No intra-abdominal free air or
free fluid.

The liver, gallbladder, pancreas, spleen, right adrenal gland appear
unremarkable. A 1.3 cm indeterminate left adrenal nodule, likely an
adenoma. There is moderate right renal atrophy, increased from prior
study. There is asymmetric hypo enhancement of the right renal
parenchyma compatible with hypoperfusion likely related to vascular
compromise. Ultrasound with duplex interrogation of the right renal
artery or CT angiography is recommended for better evaluation. There
is no hydronephrosis on the right. A 4 mm nonobstructing left renal
upper pole calculus. No hydronephrosis. The visualized ureters and
urinary bladder appear unremarkable. The prostate and seminal
vesicles are grossly unremarkable.

Small hiatal hernia with gastroesophageal reflux. Oral contrast
opacifies the stomach and loops of small bowel. There is no evidence
of bowel obstruction or inflammation. Appendectomy.

Aortoiliac atherosclerotic disease. There is a nonenhancing 3.5 x
3.6 cm (previously 2.5 x 2.7 cm) the structure to the left of the
aorta at the level of the left renal artery. There is thin
calcification of the wall of this structure. This likely represents
a thrombosed pseudo aneurysm. Other etiologies are less likely.
Follow-up recommended. There is a 3.3 cm infrarenal abdominal aortic
aneurysm (previously 3.0 cm). No portal venous gas identified. There
is no adenopathy. The abdominal wall soft tissues appear
unremarkable. There is degenerative changes of the spine. No acute
fracture.
IMPRESSION: Move no pneumoperitoneum or evidence of traumatic bowel injury.

Moderate right renal atrophy with hypoperfusion of the right kidney
concerning for vascular compromise. Further evaluation of the right
kidney as well as interrogation of the right renal artery with
duplex ultrasound or CT angiography recommended.

Nonobstructing 4 mm left renal calculus.

Interval increase in the size of the left para-aortic lesion, likely
a thrombosed pseudoaneurysm. Follow-up recommended.

A 3.3 cm infrarenal abdominal aortic aneurysm. Recommend followup by
ultrasound in 3 years. This recommendation follows ACR consensus
guidelines: White Paper of the ACR Incidental Findings Committee II

## 2017-07-04 IMAGING — DX DG CHEST 2V
2 series · 2 of 2 positions shown · non-contrast
Comparison: Radiograph dated 04/09/2015

CLINICAL DATA: 64-year-old male with nausea and vomiting and sharp
right lower quadrant abdominal pain

EXAM:
CHEST  2 VIEW

[chest pa]
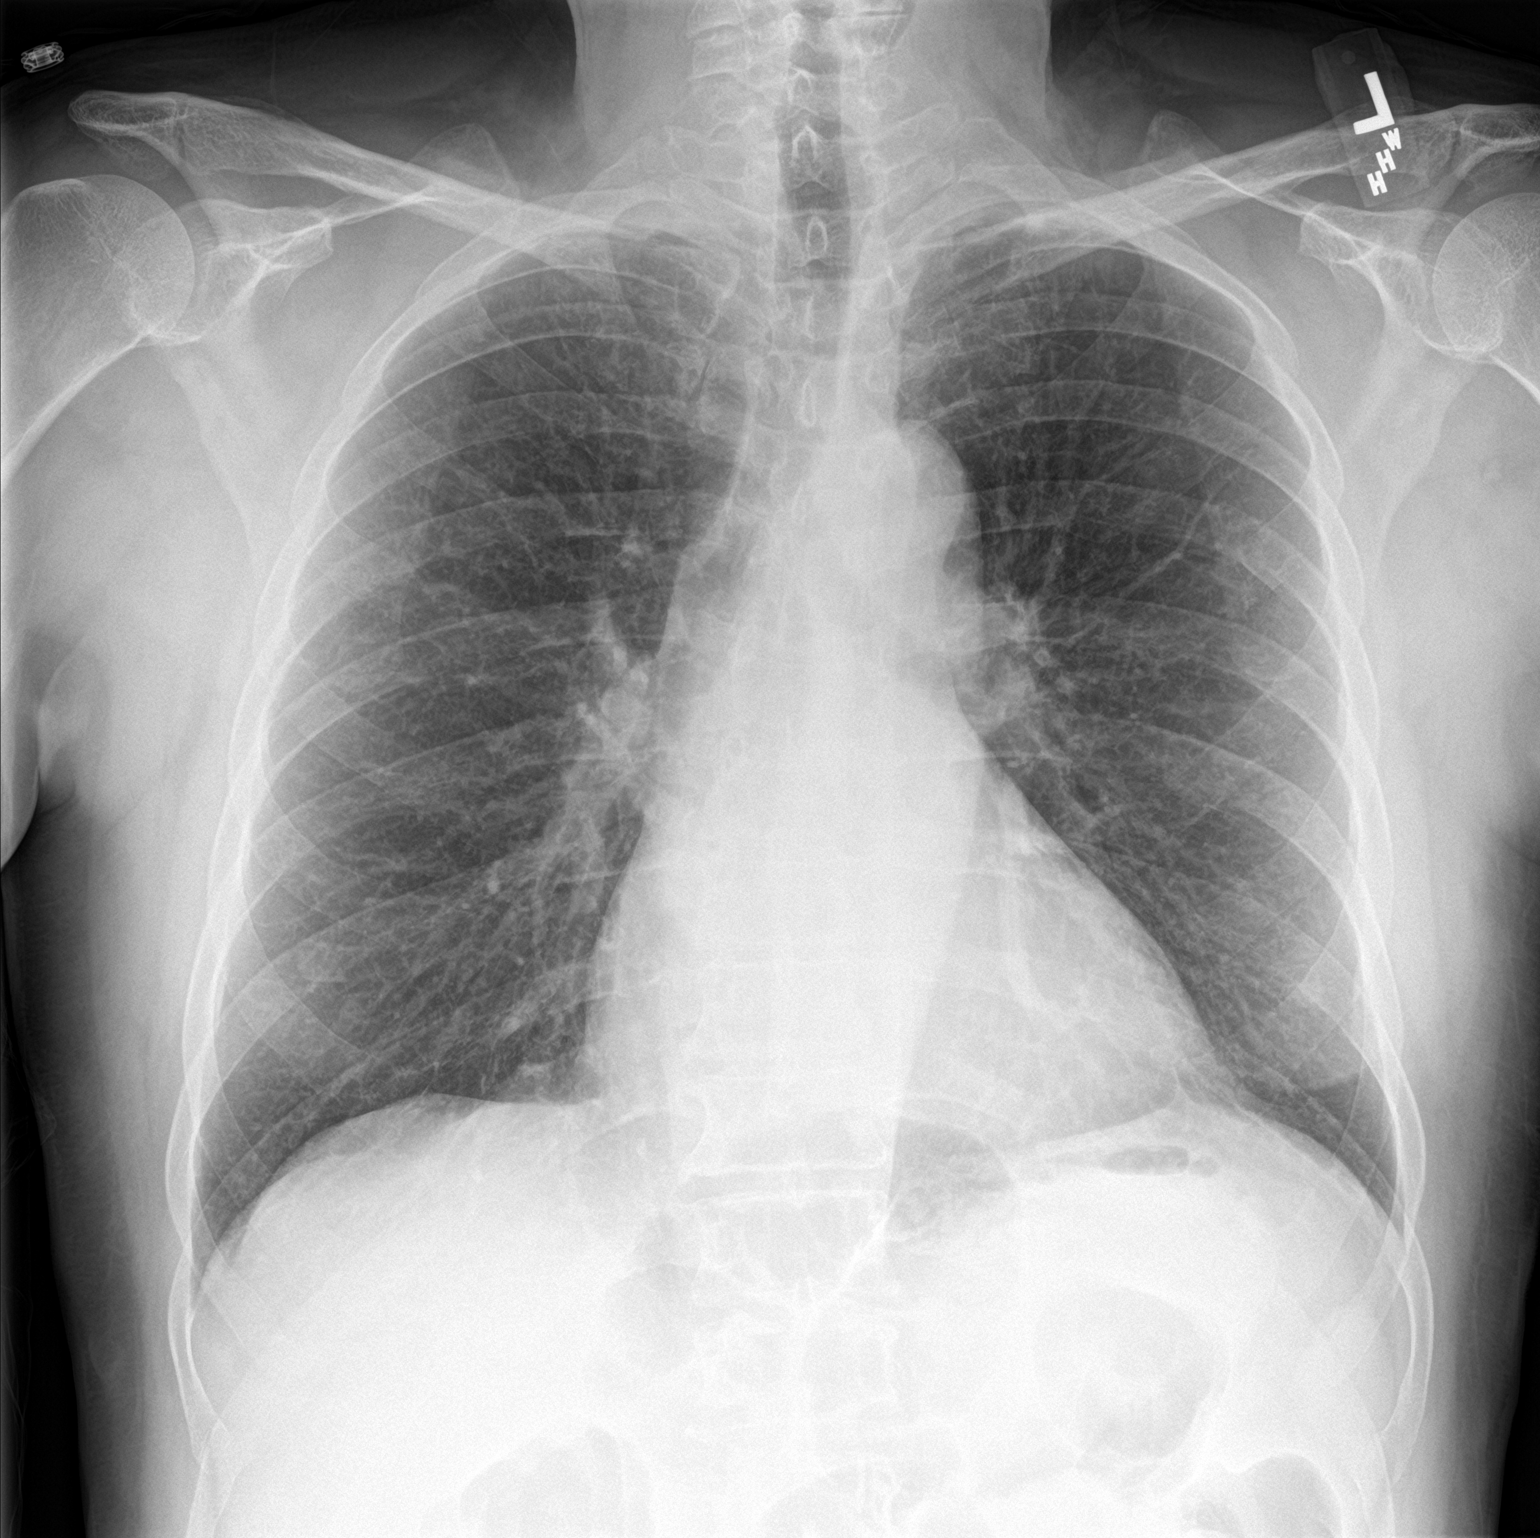

[chest lat]
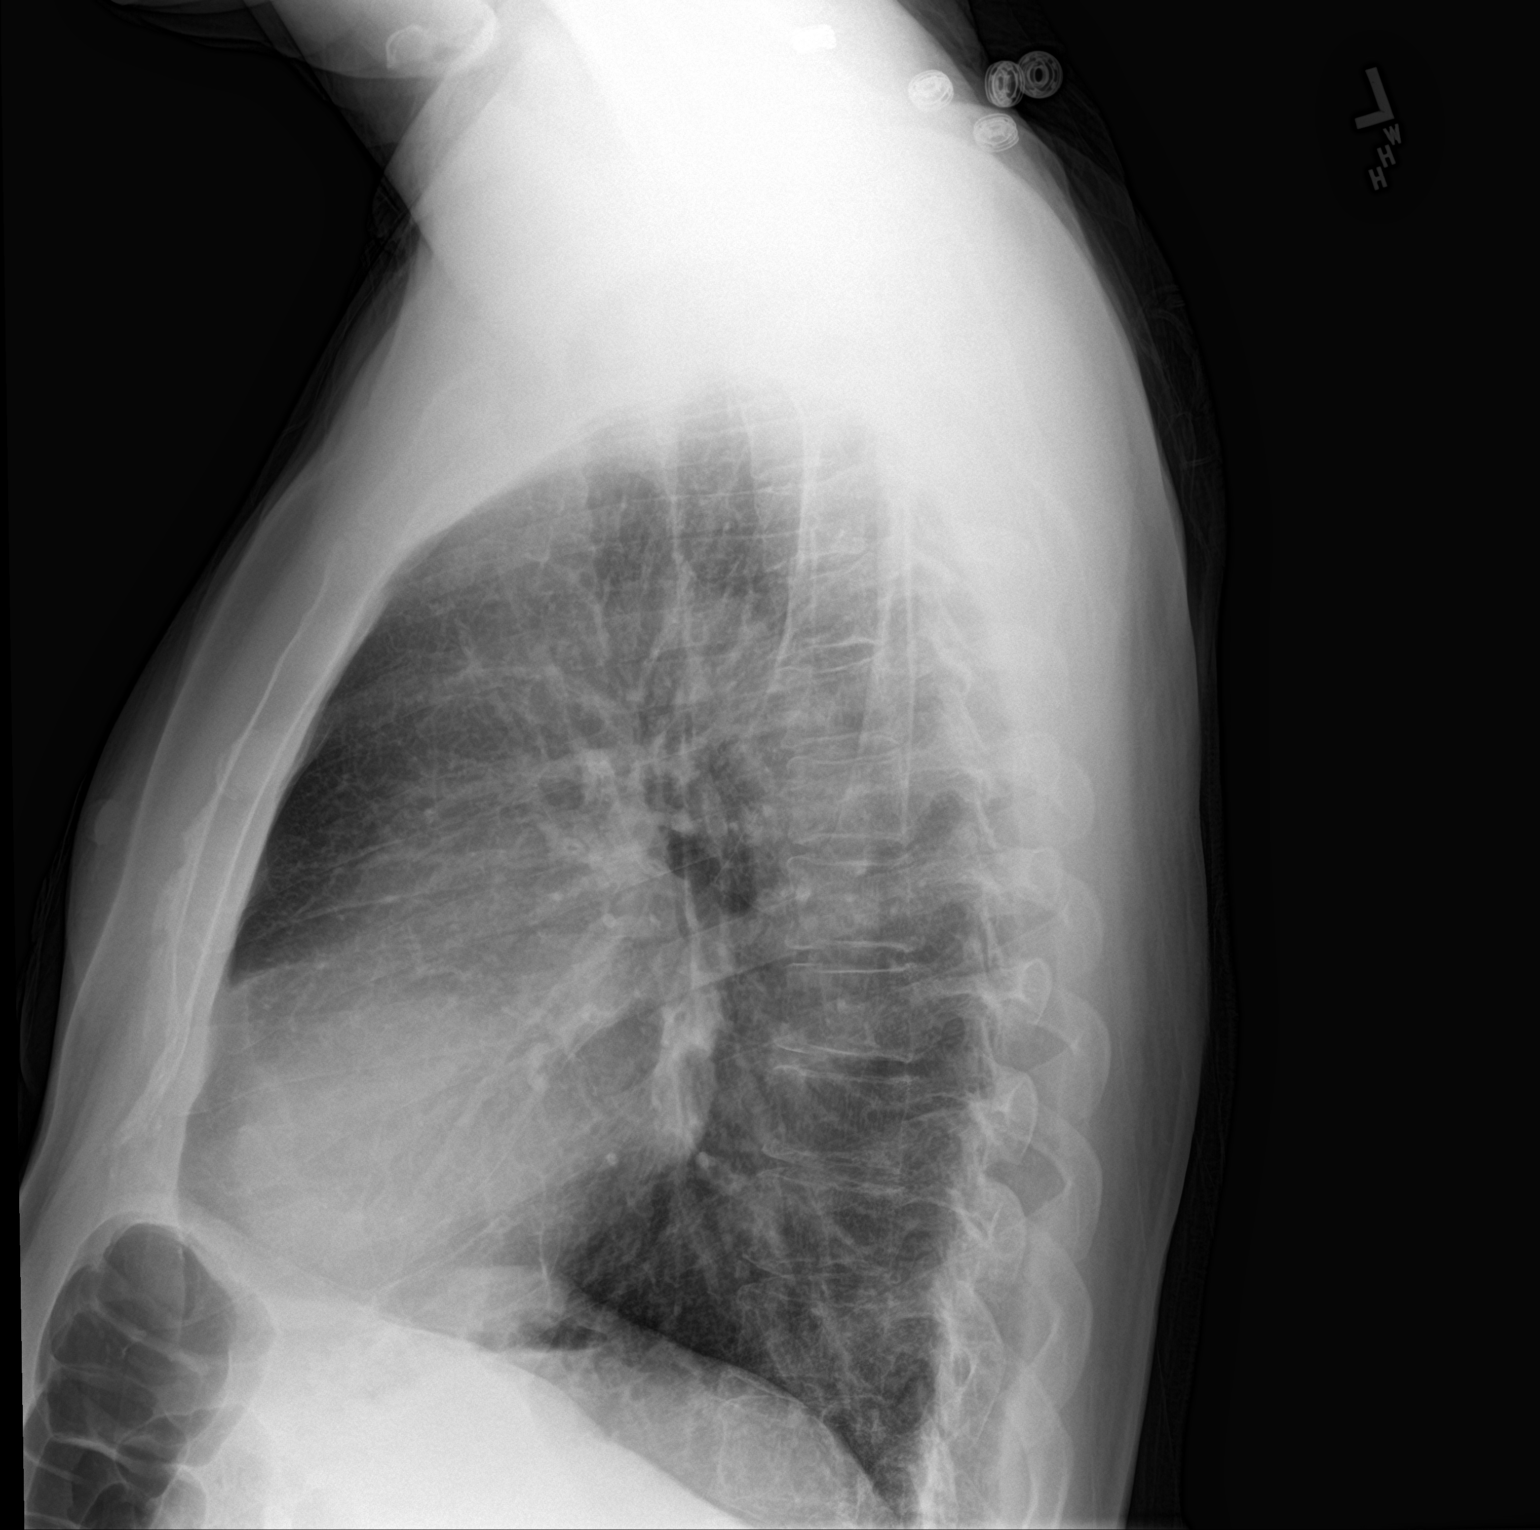

[2 of 2 positions shown; findings below may reference images not displayed]

FINDINGS: The heart size and mediastinal contours are within normal limits.
Both lungs are clear. The visualized skeletal structures are
unremarkable.
IMPRESSION: No active cardiopulmonary disease.

## 2017-07-07 ENCOUNTER — Other Ambulatory Visit: Payer: Self-pay | Admitting: Family Medicine

## 2017-07-11 ENCOUNTER — Encounter: Payer: Self-pay | Admitting: Cardiology

## 2017-07-11 ENCOUNTER — Ambulatory Visit (INDEPENDENT_AMBULATORY_CARE_PROVIDER_SITE_OTHER): Payer: Medicare Other | Admitting: Cardiology

## 2017-07-11 ENCOUNTER — Ambulatory Visit (INDEPENDENT_AMBULATORY_CARE_PROVIDER_SITE_OTHER): Payer: Medicare Other | Admitting: *Deleted

## 2017-07-11 VITALS — BP 164/100 | HR 78 | Ht 72.0 in | Wt 188.4 lb

## 2017-07-11 DIAGNOSIS — I4891 Unspecified atrial fibrillation: Secondary | ICD-10-CM

## 2017-07-11 DIAGNOSIS — I482 Chronic atrial fibrillation: Secondary | ICD-10-CM | POA: Diagnosis not present

## 2017-07-11 DIAGNOSIS — I10 Essential (primary) hypertension: Secondary | ICD-10-CM | POA: Diagnosis not present

## 2017-07-11 DIAGNOSIS — I5043 Acute on chronic combined systolic (congestive) and diastolic (congestive) heart failure: Secondary | ICD-10-CM

## 2017-07-11 DIAGNOSIS — I4821 Permanent atrial fibrillation: Secondary | ICD-10-CM

## 2017-07-11 DIAGNOSIS — I5022 Chronic systolic (congestive) heart failure: Secondary | ICD-10-CM | POA: Diagnosis not present

## 2017-07-11 DIAGNOSIS — Z79899 Other long term (current) drug therapy: Secondary | ICD-10-CM | POA: Diagnosis not present

## 2017-07-11 DIAGNOSIS — I2510 Atherosclerotic heart disease of native coronary artery without angina pectoris: Secondary | ICD-10-CM | POA: Diagnosis not present

## 2017-07-11 DIAGNOSIS — Z5181 Encounter for therapeutic drug level monitoring: Secondary | ICD-10-CM

## 2017-07-11 LAB — POCT INR: INR: 2.4

## 2017-07-11 MED ORDER — FUROSEMIDE 40 MG PO TABS: 40.0000 mg | ORAL_TABLET | Freq: Every day | ORAL | 3 refills | Status: DC

## 2017-07-11 MED ORDER — APIXABAN 5 MG PO TABS: 5.0000 mg | ORAL_TABLET | Freq: Two times a day (BID) | ORAL | 6 refills | Status: DC

## 2017-07-11 NOTE — Progress Notes (Signed)
Clinical Summary Mr. Beevers is a 67 y.o.male seen today for follow up of the following medical problems.  1. Afib - no recent palpiatons -no bleeding troubles  2. CAD  - no chest pain, no SOB/DOE  3. HTN - she is compliant with meds - does not check at home.   4. PAD - s/p repair of juxtarenal abdominal aortic aneurysm, left renal artery bypass, followed by vascular   5. Chronic sysotlic HF - ehco 06/7739 LVEF 35-40% - Jan 2019 echo LVEF 50%. Abnormal diastolic function - Occasional SOB with laying down. Has had some recent LE edema - compliant with meds   6. HL - 12/2016 TC 129 TG 258 HDL 30 LDL 47 - he remains compliant with statin   Past Medical History:  Diagnosis Date  . AAA (abdominal aortic aneurysm) ([place])    a. s/p repair 06/2015 with left renal artery bypass at that time, post-op course c/b renal failure requiring dialysis, C dif.  . Anemia   . Arteriosclerotic cardiovascular disease (ASCVD)    a. AMI in 2000 treated at [facility]. b. cath in 12/2006->  Chronic total obstruction of the RCA;  drug-eluting stent placed in OM1, LVEF abnormal.  . Cerebrovascular disease 2002   carotid stent  . Chronic anticoagulation   . Chronic combined systolic and diastolic CHF (congestive heart failure) ([place])   . CKD (chronic kidney disease), stage IV ([place])   . GERD (gastroesophageal reflux disease)   . Hyperlipidemia   . Hypertension   . Myocardial infarction ([place]) 10 yrs ago  . Nephrolithiasis   . Permanent atrial fibrillation ([place])   . PVD (peripheral vascular disease) ([place])    Ct angiogram in 2009 revealed stable disease with 80% celiac stenosis,50% right renal artery ,ASCVD with ulceration in the abdominal aortashe  . Testicular carcinoma ([place]) 1990   right orchiectomy  . Tobacco abuse, in remission    20 pack years; quit in 2009     No Known Allergies   Current Outpatient Medications  Medication Sig Dispense Refill  . albuterol (PROVENTIL  HFA;VENTOLIN HFA) 108 (90 BASE) MCG/ACT inhaler Inhale 2-4 puffs into the lungs every 4 (four) hours as needed for wheezing or shortness of breath. 1 Inhaler 0  . amLODipine (NORVASC) 10 MG tablet TAKE 1 TABLET BY MOUTH ONCE DAILY 90 tablet 0  . atorvastatin (LIPITOR) 20 MG tablet TAKE 1 TABLET BY MOUTH AT BEDTIME. 30 tablet 3  . diphenhydrAMINE (BENADRYL) 25 MG tablet Take 25 mg by mouth daily as needed for allergies.    . ferrous sulfate 325 (65 FE) MG tablet TAKE 1 TABLET BY MOUTH THREE TIMES DAILY WITH FOOD 90 tablet 2  . fexofenadine (ALLEGRA) 180 MG tablet Take 1 tablet (180 mg total) by mouth daily. 30 tablet 2  . hydrALAZINE (APRESOLINE) 25 MG tablet Take 1 tablet (25 mg total) by mouth 3 (three) times daily. 270 tablet 3  . lisinopril (PRINIVIL,ZESTRIL) 40 MG tablet Take 1 tablet (40 mg total) by mouth daily. 90 tablet 0  . LORazepam (ATIVAN) 0.5 MG tablet TAKE 1 TABLET BY MOUTH TWICE DAILY AS NEEDED FOR ANXIETY **MUST  LAST  30  DAYS** 45 tablet 2  . metoprolol succinate (TOPROL XL) 100 MG 24 hr tablet Take 1 and 1/2 tablets (150 mg ) daily 135 tablet 3  . Multiple Vitamin (ONE-A-DAY MENS PO) Take 1 tablet by mouth daily.    . nitroGLYCERIN (NITROSTAT) 0.4 MG SL tablet place 1 tablet under the  tongue if needed every 5 minutes for chest pain for 3 doses IF NO RELIEF AFTER 3RD DOSE CALL PRESCRIBER OR 911. 25 tablet 2  . Nutritional Supplements (FEEDING SUPPLEMENT, NEPRO CARB STEADY,) LIQD Take 237 mLs by mouth 2 (two) times daily between meals.  0  . Omega-3 Fatty Acids (FISH OIL) 1000 MG CAPS Take 1,000 mg by mouth daily.   0  . ondansetron (ZOFRAN ODT) 4 MG disintegrating tablet Take 1 tablet (4 mg total) by mouth every 8 (eight) hours as needed for nausea or vomiting. 20 tablet 0  . pantoprazole (PROTONIX) 40 MG tablet TAKE 1 TABLET BY MOUTH ONCE DAILY 60 tablet 0  . sodium bicarbonate 650 MG tablet TAKE 1 TABLET BY MOUTH THREE TIMES DAILY 90 tablet 0  . tamsulosin (FLOMAX) 0.4 MG CAPS  capsule TAKE 1 CAPSULE BY MOUTH ONCE DAILY AFTER SUPPER 60 capsule 0  . traZODone (DESYREL) 50 MG tablet TAKE 1 TABLET BY MOUTH AT BEDTIME AS NEEDED FOR SLEEP 90 tablet 0  . warfarin (COUMADIN) 5 MG tablet Take 1 1/2 tablets daily except 1 tablet on Mondays, Wednesdays and Fridays 45 tablet 3  . warfarin (COUMADIN) 5 MG tablet Take 1 1/2 tablets daily except 1 tablet on Mondays, Wednesdays and Fridays 40 tablet 0   No current facility-administered medications for this visit.      Past Surgical History:  Procedure Laterality Date  . ABDOMINAL AORTIC ANEURYSM REPAIR N/A 06/09/2015   Procedure: ANEURYSM ABDOMINAL AORTIC REPAIR;  Surgeon: Elam Dutch, MD;  Location: [facility] OR;  Service: Vascular;  Laterality: N/A;  . AORTIC ENDARTERECETOMY N/A 06/09/2015   Procedure: AORTIC ENDARTERECETOMY;  Surgeon: Elam Dutch, MD;  Location: [place];  Service: Vascular;  Laterality: N/A;  . AORTIC/RENAL BYPASS Left 06/09/2015   Procedure: LEFT RENAL Artery BYPASS;  Surgeon: Elam Dutch, MD;  Location: [place];  Service: Vascular;  Laterality: Left;  . APPENDECTOMY  2004  . COLONOSCOPY  06/17/2011   INCOMPLETE, PREP POOR. Procedure: COLONOSCOPY;  Surgeon: Daneil Dolin, MD;  [facility];  Service: Endoscopy;  Laterality: N/A;  10:00  . COLONOSCOPY  07/15/2011   WUX:[id number] rectal and colon polyps  . COLONOSCOPY N/A 05/22/2015   VOZ:[id number] colonic and rectal polyps. tubular adenomas.repeat TCS 05/2018  . ESOPHAGEAL DILATION N/A 05/22/2015   Procedure: ESOPHAGEAL DILATION;  Surgeon: Daneil Dolin, MD;  [facility];  Service: Endoscopy;  Laterality: N/A;  . ESOPHAGOGASTRODUODENOSCOPY  06/17/2011   severe erosive/ulcerative reflux esophagitis, soft noncritical stricture dilatied, small hh, antral erosion   . ESOPHAGOGASTRODUODENOSCOPY N/A 05/22/2015   RMR: 2 cm HH otherwise normal. s/p empirical dilation.  . INSERTION OF DIALYSIS CATHETER Left 06/26/2015   Procedure: INSERTION OF  DIALYSIS CATHETER;  Surgeon: Angelia Mould, MD;  Location: [place];  Service: Vascular;  Laterality: Left;  . PERIPHERAL VASCULAR CATHETERIZATION N/A 05/28/2015   Procedure: Abdominal Aortogram;  Surgeon: Serafina Mitchell, MD;  Location: [place] [facility];  Service: Cardiovascular;  Laterality: N/A;  . testicular cancer  1990   right orchiectomy     No Known Allergies    Family History  Problem Relation Age of Onset  . Colon cancer Father 75       deceased  . Prostate cancer Father   . Heart disease Mother   . Liver disease Neg Hx   . Anesthesia problems Neg Hx   . Hypotension Neg Hx   . Malignant hyperthermia Neg Hx   .  Pseudochol deficiency Neg Hx      Social History Mr. Lenderman reports that he has been smoking cigarettes.  He has a 4.00 pack-year smoking history. he has never used smokeless tobacco. Mr. Dinneen reports that he does not drink alcohol.   Review of Systems CONSTITUTIONAL: No weight loss, fever, chills, weakness or fatigue.  HEENT: Eyes: No visual loss, blurred vision, double vision or yellow sclerae.No hearing loss, sneezing, congestion, runny nose or sore throat.  SKIN: No rash or itching.  CARDIOVASCULAR: per hpi RESPIRATORY: No shortness of breath, cough or sputum.  GASTROINTESTINAL: No anorexia, nausea, vomiting or diarrhea. No abdominal pain or blood.  GENITOURINARY: No burning on urination, no polyuria NEUROLOGICAL: No headache, dizziness, syncope, paralysis, ataxia, numbness or tingling in the extremities. No change in bowel or bladder control.  MUSCULOSKELETAL: No muscle, back pain, joint pain or stiffness.  LYMPHATICS: No enlarged nodes. No history of splenectomy.  PSYCHIATRIC: No history of depression or anxiety.  ENDOCRINOLOGIC: No reports of sweating, cold or heat intolerance. No polyuria or polydipsia.  [name]   Physical Examination Vitals:   07/11/17 1506  BP: (!) 164/100  Pulse: 78  SpO2: 96%   Vitals:   07/11/17 1506  Weight: 188 lb  6.4 oz (85.5 kg)  Height: 6' (1.829 m)    Gen: resting comfortably, no acute distress HEENT: no scleral icterus, pupils equal round and reactive, no palptable cervical adenopathy,  CV: RRR, no m/r/g, no jvd Resp: Clear to auscultation bilaterally GI: abdomen is soft, non-tender, non-distended, normal bowel sounds, no hepatosplenomegaly MSK: extremities are warm, 1+ bilateral Edema Skin: warm, no rash Neuro:  no focal deficits Psych: appropriate affect   Diagnostic Studies 06/2015 echo - Left ventricle: The cavity size was normal. Systolic function was  moderately reduced. The estimated ejection fraction was in the  range of 35% to 40%. - Pulmonary arteries: PA peak pressure: 33 mm Hg (S). - Inferior vena cava: The vessel was dilated. The respirophasic  diameter changes were blunted (<50%). This is a nonspecific  finding during positive pressure ventilation.  Impressions:  - Technically limited study due to poor echo windows and  tachycardia. Consider TEE if more accurate evaluation is  necessary.   Jan 2019 echo  Study Conclusions  - Left ventricle: The cavity size was normal. Wall thickness was   increased in a pattern of mild LVH. Systolic function was at the   lower limits of normal. The estimated ejection fraction was 50%.   Diffuse hypokinesis. The study was not technically sufficient to   allow evaluation of LV diastolic dysfunction due to atrial   fibrillation. Doppler parameters are consistent with high   ventricular filling pressure. - Aortic valve: Trileaflet; mildly thickened leaflets. - Mitral valve: There was mild regurgitation. - Left atrium: The atrium was moderately dilated. - Right atrium: The atrium was moderately dilated.   Assessment and Plan  1. Afib - CHADS2Vasc score is 3. He is interested in changing to eliuqis. Stop coumadin, wait 3 days then start eliquis 5mg  bid - no symptoms, continue current meds  2. CAD - doing well,  continue current meds  3. HTN - elevated in clinic - signs of fluid overload, start lasix 40mg  daily and follow bp - nursing visit 2 weeks, if still elevated will need to change bp meds.   4. Chronic systolic HF -LVEF has normalized - does have some evidence of fluid overload.  - start lasix 40mg  daily, check BMET/Mg/TSH in 2 weeks.   5.  PAD - reestablish with vascular      Arnoldo Lenis, M.D.

## 2017-07-11 NOTE — Patient Instructions (Addendum)
Your physician wants you to follow-up in:3 months with Dr.Branch You will receive a reminder letter in the mail two months in advance. If you don't receive a letter, please call our office to schedule the follow-up appointment.   Make 2 week nurse apt for BP and weight check  In 2 weeks get lab work : BMET,Magnesium, TSH  Make 1 month apt with Edrick Oh RN, she will follow you on Eliquis also   START Eliquis 5 mg twice a dayLot [id number], exp 10/2019  3 boxes given  Plus 30 day free trail card   START Lasix 40 mg daily   STOP Warfarin, after 3 days START Eliquis on this Thursday 07/14/17   DASH Eating Plan DASH stands for "Dietary Approaches to Stop Hypertension." The DASH eating plan is a healthy eating plan that has been shown to reduce high blood pressure (hypertension). It may also reduce your risk for type 2 diabetes, heart disease, and stroke. The DASH eating plan may also help with weight loss. What are tips for following this plan? General guidelines  Avoid eating more than 2,300 mg (milligrams) of salt (sodium) a day. If you have hypertension, you may need to reduce your sodium intake to 1,500 mg a day.  Limit alcohol intake to no more than 1 drink a day for nonpregnant women and 2 drinks a day for men. One drink equals 12 oz of beer, 5 oz of wine, or 1 oz of hard liquor.  Work with your health care provider to maintain a healthy body weight or to lose weight. Ask what an ideal weight is for you.  Get at least 30 minutes of exercise that causes your heart to beat faster (aerobic exercise) most days of the week. Activities may include walking, swimming, or biking.  Work with your health care provider or diet and nutrition specialist (dietitian) to adjust your eating plan to your individual calorie needs. Reading food labels  Check food labels for the amount of sodium per serving. Choose foods with less than 5 percent of the Daily Value of sodium. Generally, foods with less  than 300 mg of sodium per serving fit into this eating plan.  To find whole grains, look for the word "whole" as the first word in the ingredient list. Shopping  Buy products labeled as "low-sodium" or "no salt added."  Buy fresh foods. Avoid canned foods and premade or frozen meals. Cooking  Avoid adding salt when cooking. Use salt-free seasonings or herbs instead of table salt or sea salt. Check with your health care provider or pharmacist before using salt substitutes.  Do not fry foods. Cook foods using healthy methods such as baking, boiling, grilling, and broiling instead.  Cook with heart-healthy oils, such as olive, canola, soybean, or sunflower oil. Meal planning   Eat a balanced diet that includes: ? 5 or more servings of fruits and vegetables each day. At each meal, try to fill half of your plate with fruits and vegetables. ? Up to 6-8 servings of whole grains each day. ? Less than 6 oz of lean meat, poultry, or fish each day. A 3-oz serving of meat is about the same size as a deck of cards. One egg equals 1 oz. ? 2 servings of low-fat dairy each day. ? A serving of nuts, seeds, or beans 5 times each week. ? Heart-healthy fats. Healthy fats called Omega-3 fatty acids are found in foods such as flaxseeds and coldwater fish, like sardines, salmon, and mackerel.  Limit how much you eat of the following: ? Canned or prepackaged foods. ? Food that is high in trans fat, such as fried foods. ? Food that is high in saturated fat, such as fatty meat. ? Sweets, desserts, sugary drinks, and other foods with added sugar. ? Full-fat dairy products.  Do not salt foods before eating.  Try to eat at least 2 vegetarian meals each week.  Eat more home-cooked food and less restaurant, buffet, and fast food.  When eating at a restaurant, ask that your food be prepared with less salt or no salt, if possible. What foods are recommended? The items listed may not be a complete list. Talk  with your dietitian about what dietary choices are best for you. Grains Whole-grain or whole-wheat bread. Whole-grain or whole-wheat pasta. Brown rice. [place] Morrow. Bulgur. Whole-grain and low-sodium cereals. Pita bread. Low-fat, low-sodium crackers. Whole-wheat flour tortillas. Vegetables Fresh or frozen vegetables (raw, steamed, roasted, or grilled). Low-sodium or reduced-sodium tomato and vegetable juice. Low-sodium or reduced-sodium tomato sauce and tomato paste. Low-sodium or reduced-sodium canned vegetables. Fruits All fresh, dried, or frozen fruit. Canned fruit in natural juice (without added sugar). Meat and other protein foods Skinless chicken or [place]. Ground chicken or [place]. Pork with fat trimmed off. Fish and seafood. Egg whites. Dried beans, peas, or lentils. Unsalted nuts, nut butters, and seeds. Unsalted canned beans. Lean cuts of beef with fat trimmed off. Low-sodium, lean deli meat. Dairy Low-fat (1%) or fat-free (skim) milk. Fat-free, low-fat, or reduced-fat cheeses. Nonfat, low-sodium ricotta or cottage cheese. Low-fat or nonfat yogurt. Low-fat, low-sodium cheese. Fats and oils Soft margarine without trans fats. Vegetable oil. Low-fat, reduced-fat, or light mayonnaise and salad dressings (reduced-sodium). Canola, safflower, olive, soybean, and sunflower oils. Avocado. Seasoning and other foods Herbs. Spices. Seasoning mixes without salt. Unsalted popcorn and pretzels. Fat-free sweets. What foods are not recommended? The items listed may not be a complete list. Talk with your dietitian about what dietary choices are best for you. Grains Baked goods made with fat, such as croissants, muffins, or some breads. Dry pasta or rice meal packs. Vegetables Creamed or fried vegetables. Vegetables in a cheese sauce. Regular canned vegetables (not low-sodium or reduced-sodium). Regular canned tomato sauce and paste (not low-sodium or reduced-sodium). Regular tomato and vegetable  juice (not low-sodium or reduced-sodium). Angie Fava. Olives. Fruits Canned fruit in a light or heavy syrup. Fried fruit. Fruit in cream or butter sauce. Meat and other protein foods Fatty cuts of meat. Ribs. Fried meat. Berniece Salines. Sausage. Bologna and other processed lunch meats. Salami. Fatback. Hotdogs. Bratwurst. Salted nuts and seeds. Canned beans with added salt. Canned or smoked fish. Whole eggs or egg yolks. Chicken or [place] with skin. Dairy Whole or 2% milk, cream, and half-and-half. Whole or full-fat cream cheese. Whole-fat or sweetened yogurt. Full-fat cheese. Nondairy creamers. Whipped toppings. Processed cheese and cheese spreads. Fats and oils Butter. Stick margarine. Lard. Shortening. Ghee. Bacon fat. Tropical oils, such as coconut, palm kernel, or palm oil. Seasoning and other foods Salted popcorn and pretzels. Onion salt, garlic salt, seasoned salt, table salt, and sea salt. Worcestershire sauce. Tartar sauce. Barbecue sauce. Teriyaki sauce. Soy sauce, including reduced-sodium. Steak sauce. Canned and packaged gravies. Fish sauce. Oyster sauce. Cocktail sauce. Horseradish that you find on the shelf. Ketchup. Mustard. Meat flavorings and tenderizers. Bouillon cubes. Hot sauce and Tabasco sauce. Premade or packaged marinades. Premade or packaged taco seasonings. Relishes. Regular salad dressings. Where to find more information:  National Heart, Lung, and Blood  Institute: https://wilson-eaton.com/  American Heart Association: www.heart.org Summary  The DASH eating plan is a healthy eating plan that has been shown to reduce high blood pressure (hypertension). It may also reduce your risk for type 2 diabetes, heart disease, and stroke.  With the DASH eating plan, you should limit salt (sodium) intake to 2,300 mg a day. If you have hypertension, you may need to reduce your sodium intake to 1,500 mg a day.  When on the DASH eating plan, aim to eat more fresh fruits and vegetables, whole grains,  lean proteins, low-fat dairy, and heart-healthy fats.  Work with your health care provider or diet and nutrition specialist (dietitian) to adjust your eating plan to your individual calorie needs. This information is not intended to replace advice given to you by your health care provider. Make sure you discuss any questions you have with your health care provider. Document Released: 04/08/2011 Document Revised: 04/12/2016 Document Reviewed: 04/12/2016 Elsevier Interactive Patient Education  2018 [employer].   Thank you for choosing [facility] !

## 2017-07-11 NOTE — Patient Instructions (Signed)
Continue coumadin 1.5 tablets everyday except 1 tablet on Mondays, Wednesdays and Fridays. Recheck in 6 weeks.

## 2017-07-12 ENCOUNTER — Other Ambulatory Visit: Payer: Self-pay | Admitting: Family Medicine

## 2017-07-14 ENCOUNTER — Encounter: Payer: Self-pay | Admitting: Cardiology

## 2017-07-26 ENCOUNTER — Ambulatory Visit (INDEPENDENT_AMBULATORY_CARE_PROVIDER_SITE_OTHER): Payer: Medicare Other

## 2017-07-26 VITALS — BP 126/72 | Wt 184.0 lb

## 2017-07-26 DIAGNOSIS — Z0130 Encounter for examination of blood pressure without abnormal findings: Secondary | ICD-10-CM | POA: Diagnosis not present

## 2017-07-26 NOTE — Progress Notes (Signed)
Pt has lost 4 lbs, BP is in normal range now,will forward to Dr Harl Bowie

## 2017-07-26 NOTE — Patient Instructions (Signed)
Continue taking your medications as directed unless we call you     Thank you for choosing [facility] !

## 2017-07-27 DIAGNOSIS — Z79899 Other long term (current) drug therapy: Secondary | ICD-10-CM | POA: Diagnosis not present

## 2017-07-27 LAB — BASIC METABOLIC PANEL
BUN/Creatinine Ratio: 8 (calc) (ref 6–22)
BUN: 18 mg/dL (ref 7–25)
CO2: 29 mmol/L (ref 20–32)
Calcium: 9.5 mg/dL (ref 8.6–10.3)
Chloride: 105 mmol/L (ref 98–110)
Creat: 2.34 mg/dL — ABNORMAL HIGH (ref 0.70–1.25)
Glucose, Bld: 125 mg/dL — ABNORMAL HIGH (ref 65–99)
Potassium: 3.9 mmol/L (ref 3.5–5.3)
Sodium: 140 mmol/L (ref 135–146)

## 2017-07-27 LAB — MAGNESIUM: Magnesium: 1.9 mg/dL (ref 1.5–2.5)

## 2017-07-27 LAB — TSH: TSH: 1.03 mIU/L (ref 0.40–4.50)

## 2017-07-28 ENCOUNTER — Telehealth: Payer: Self-pay

## 2017-07-28 MED ORDER — FUROSEMIDE 40 MG PO TABS: ORAL_TABLET | ORAL | 3 refills | Status: DC

## 2017-07-28 NOTE — Telephone Encounter (Signed)
-----   Message from Arnoldo Lenis, MD sent at 07/28/2017  4:04 PM EDT -----   ----- Message ----- From: Bernita Raisin, RN Sent: 07/26/2017   4:31 PM To: Arnoldo Lenis, MD

## 2017-07-28 NOTE — Progress Notes (Signed)
Labs show mild decrease in kidney function. Glad his weights are down and bp improved. I would change lasix to 40mg  alternating with 20mg . Clarify he has been taking 40mg  daily   Zandra Abts MD

## 2017-07-28 NOTE — Telephone Encounter (Signed)
Wife confirmed dose, will decrease to 40 mg alternating with 20 mg dosing of lasix

## 2017-08-10 ENCOUNTER — Other Ambulatory Visit (HOSPITAL_COMMUNITY)
Admission: RE | Admit: 2017-08-10 | Discharge: 2017-08-10 | Disposition: A | Discharge status: Home / Self Care | Payer: Medicare Other | Source: Ambulatory Visit | Attending: Cardiology | Admitting: Cardiology

## 2017-08-10 ENCOUNTER — Ambulatory Visit (INDEPENDENT_AMBULATORY_CARE_PROVIDER_SITE_OTHER): Payer: Medicare Other | Admitting: *Deleted

## 2017-08-10 DIAGNOSIS — Z5181 Encounter for therapeutic drug level monitoring: Secondary | ICD-10-CM | POA: Diagnosis not present

## 2017-08-10 DIAGNOSIS — I4891 Unspecified atrial fibrillation: Secondary | ICD-10-CM | POA: Diagnosis present

## 2017-08-10 LAB — BASIC METABOLIC PANEL
Anion gap: 10 (ref 5–15)
BUN: 16 mg/dL (ref 6–20)
CO2: 25 mmol/L (ref 22–32)
Calcium: 9.5 mg/dL (ref 8.9–10.3)
Chloride: 105 mmol/L (ref 101–111)
Creatinine, Ser: 2 mg/dL — ABNORMAL HIGH (ref 0.61–1.24)
GFR calc Af Amer: 39 mL/min — ABNORMAL LOW (ref >60)
GFR calc non Af Amer: 33 mL/min — ABNORMAL LOW (ref >60)
Glucose, Bld: 96 mg/dL (ref 65–99)
Potassium: 4.3 mmol/L (ref 3.5–5.1)
Sodium: 140 mmol/L (ref 135–145)

## 2017-08-10 LAB — CBC
HCT: 41.5 % (ref 39.0–52.0)
Hemoglobin: 13.5 g/dL (ref 13.0–17.0)
MCH: 29 pg (ref 26.0–34.0)
MCHC: 32.5 g/dL (ref 30.0–36.0)
MCV: 89.2 fL (ref 78.0–100.0)
Platelets: 162 10*3/uL (ref 150–400)
RBC: 4.65 MIL/uL (ref 4.22–5.81)
RDW: 14.2 % (ref 11.5–15.5)
WBC: 5.2 10*3/uL (ref 4.0–10.5)

## 2017-08-10 NOTE — Progress Notes (Addendum)
Pt was started on Eliquis 5mg  bid for atrial fib on 07/11/17 by Dr Harl Bowie.    Pt denies any problems since starting Eliquis 1 month ago.  No bleeding, abnormal bruising or GI upset.  Pt was sent to [facility] lab today for repeat BMP and CBC.  Wt: 85.1kg Labs: 08/10/17  SCr 2.0  CrCl 44.53  Hgb 13.5  Hct 41.5  Plts 162K  Reviewed patients medication list.  Pt is not currently on any combined P-gp and strong CYP3A4 inhibitors/inducers (ketoconazole, traconazole, ritonavir, carbamazepine, phenytoin, rifampin, St. John's wort).  Reviewed labs from 07/27/17.  SCr 2.34,  Weight 85.5, CrCl 38.50ml/min.  Dose is appropriate based on age, weight, and SCr.  Hgb and HCT:  Not done  A full discussion of the nature of anticoagulants has been carried out.  A benefit/risk analysis has been presented to the patient, so that they understand the justification for choosing anticoagulation with Eliquis at this time.  The need for compliance is stressed.  Pt is aware to take the medication twice daily.  Side effects of potential bleeding are discussed, including unusual colored urine or stools, coughing up blood or coffee ground emesis, nose bleeds or serious fall or head trauma.  Discussed signs and symptoms of stroke. The patient should avoid any OTC items containing aspirin or ibuprofen.  Avoid alcohol consumption.   Call if any signs of abnormal bleeding.  Discussed financial obligations and resolved any difficulty in obtaining medication.  Next lab test in 6 months.   Call pt with lab results/make f/u appt

## 2017-08-12 ENCOUNTER — Other Ambulatory Visit: Payer: Self-pay | Admitting: Family Medicine

## 2017-08-12 ENCOUNTER — Telehealth: Payer: Self-pay

## 2017-08-12 NOTE — Telephone Encounter (Signed)
-----   Message from Arnoldo Lenis, MD sent at 08/12/2017 10:51 AM EDT ----- Labs show kidney function is improving. No changes   J BrancH MD

## 2017-08-12 NOTE — Telephone Encounter (Signed)
Called pt to advise of lab results

## 2017-08-12 NOTE — Telephone Encounter (Signed)
Pt's wife made aware of lab results. She voiced understanding. Copy to pcp.

## 2017-08-13 ENCOUNTER — Other Ambulatory Visit: Payer: Self-pay | Admitting: Family Medicine

## 2017-08-30 ENCOUNTER — Ambulatory Visit (INDEPENDENT_AMBULATORY_CARE_PROVIDER_SITE_OTHER): Payer: Medicare Other | Admitting: Family Medicine

## 2017-08-30 ENCOUNTER — Encounter: Payer: Self-pay | Admitting: Family Medicine

## 2017-08-30 ENCOUNTER — Other Ambulatory Visit: Payer: Self-pay

## 2017-08-30 VITALS — BP 152/80 | HR 66 | Temp 98.1°F | Resp 16 | Ht 72.0 in | Wt 185.0 lb

## 2017-08-30 DIAGNOSIS — N184 Chronic kidney disease, stage 4 (severe): Secondary | ICD-10-CM | POA: Diagnosis not present

## 2017-08-30 DIAGNOSIS — I10 Essential (primary) hypertension: Secondary | ICD-10-CM

## 2017-08-30 DIAGNOSIS — F5101 Primary insomnia: Secondary | ICD-10-CM | POA: Diagnosis not present

## 2017-08-30 DIAGNOSIS — E782 Mixed hyperlipidemia: Secondary | ICD-10-CM

## 2017-08-30 LAB — LIPID PANEL
Cholesterol: 142 mg/dL (ref <200)
HDL: 33 mg/dL — ABNORMAL LOW (ref >40)
LDL Cholesterol (Calc): 79 mg/dL (calc)
Non-HDL Cholesterol (Calc): 109 mg/dL (calc) (ref <130)
Total CHOL/HDL Ratio: 4.3 (calc) (ref <5.0)
Triglycerides: 199 mg/dL — ABNORMAL HIGH (ref <150)

## 2017-08-30 MED ORDER — ATORVASTATIN CALCIUM 20 MG PO TABS: 20.0000 mg | ORAL_TABLET | Freq: Every day | ORAL | 3 refills | Status: DC

## 2017-08-30 MED ORDER — TAMSULOSIN HCL 0.4 MG PO CAPS: ORAL_CAPSULE | ORAL | 2 refills | Status: DC

## 2017-08-30 MED ORDER — TRAZODONE HCL 50 MG PO TABS: 50.0000 mg | ORAL_TABLET | Freq: Every evening | ORAL | 0 refills | Status: DC | PRN

## 2017-08-30 MED ORDER — PANTOPRAZOLE SODIUM 40 MG PO TBEC: 40.0000 mg | DELAYED_RELEASE_TABLET | Freq: Every day | ORAL | 2 refills | Status: DC

## 2017-08-30 MED ORDER — IRON 325 (65 FE) MG PO TABS: 1.0000 | ORAL_TABLET | Freq: Three times a day (TID) | ORAL | 2 refills | Status: DC

## 2017-08-30 MED ORDER — SODIUM BICARBONATE 650 MG PO TABS: 650.0000 mg | ORAL_TABLET | Freq: Three times a day (TID) | ORAL | 1 refills | Status: DC

## 2017-08-30 NOTE — Assessment & Plan Note (Signed)
Continue trazodone, no longer using ativan

## 2017-08-30 NOTE — Patient Instructions (Addendum)
If at home staying higher than 140/90, call us  Or Dr. Harl Bowie  Appointment with Kidney Doctor made F/U 6 months for Physical

## 2017-08-30 NOTE — Progress Notes (Signed)
   Subjective:    Patient ID: [name] [name], male    DOB: 1951/03/27, 67 y.o.   MRN: [id number]  Patient presents for Follow-up (is fasting)  Pt here to f/u chronic medical problems  Medications reviewed   HTN- taking BP meds, bp fluctates he checks at home states it has been good   AAA- has been stable, followed by vascular     CKD stage IV has not seen nephrology baseline Cr is 2.0, had recent labs from cardiology, Lasix was adjusted alternating 40mg  and 20mg  daily   Insomnia- taking ativan/trazodone once a day at bedtime   Meds reviewed  Review Of Systems:  GEN- denies fatigue, fever, weight loss,weakness, recent illness HEENT- denies eye drainage, change in vision, nasal discharge, CVS- denies chest pain, palpitations RESP- denies SOB, cough, wheeze ABD- denies N/V, change in stools, abd pain GU- denies dysuria, hematuria, dribbling, incontinence MSK- denies joint pain, muscle aches, injury Neuro- denies headache, dizziness, syncope, seizure activity       Objective:    BP (!) 152/80   Pulse 66   Temp 98.1 F (36.7 C) (Oral)   Resp 16   Ht 6' (1.829 m)   Wt 185 lb (83.9 kg)   SpO2 97%   BMI 25.09 kg/m  GEN- NAD, alert and oriented x3 HEENT- PERRL, EOMI, non injected sclera, pink conjunctiva, MMM, oropharynx clear Neck- Supple, no thyromegaly CVS- irregular rhythem, normal rate, no murmur RESP-CTAB ABD-NABS,soft,NT,ND EXT- No edema Pulses- Radial 2+        Assessment & Plan:      Problem List Items Addressed This Visit      Unprioritized   Hyperlipidemia (Chronic)   Relevant Orders   Lipid panel   Insomnia    Continue trazodone, no longer using ativan      Essential hypertension - Primary (Chronic)    BP a little elevated taoday, had check at cardiology was normal a few weeks ago, continue to monitor at home, he tends to fluctate a lot Check lipid panel      Chronic kidney disease    Referral to nephrology         Note: This  dictation was prepared with Dragon dictation along with smaller phrase technology. Any transcriptional errors that result from this process are unintentional.

## 2017-08-30 NOTE — Assessment & Plan Note (Signed)
Referral to nephrology. 

## 2017-08-30 NOTE — Assessment & Plan Note (Signed)
BP a little elevated taoday, had check at cardiology was normal a few weeks ago, continue to monitor at home, he tends to fluctate a lot Check lipid panel

## 2017-09-29 ENCOUNTER — Other Ambulatory Visit: Payer: Self-pay | Admitting: Family Medicine

## 2017-10-13 ENCOUNTER — Encounter: Payer: Self-pay | Admitting: Cardiology

## 2017-10-13 ENCOUNTER — Ambulatory Visit (INDEPENDENT_AMBULATORY_CARE_PROVIDER_SITE_OTHER): Payer: Medicare Other | Admitting: Cardiology

## 2017-10-13 VITALS — BP 130/70 | HR 62 | Ht 72.0 in | Wt 180.6 lb

## 2017-10-13 DIAGNOSIS — I2510 Atherosclerotic heart disease of native coronary artery without angina pectoris: Secondary | ICD-10-CM | POA: Diagnosis not present

## 2017-10-13 DIAGNOSIS — I10 Essential (primary) hypertension: Secondary | ICD-10-CM

## 2017-10-13 DIAGNOSIS — I255 Ischemic cardiomyopathy: Secondary | ICD-10-CM | POA: Diagnosis not present

## 2017-10-13 DIAGNOSIS — I4891 Unspecified atrial fibrillation: Secondary | ICD-10-CM | POA: Diagnosis not present

## 2017-10-13 MED ORDER — ATORVASTATIN CALCIUM 40 MG PO TABS: 40.0000 mg | ORAL_TABLET | Freq: Every day | ORAL | 3 refills | Status: DC

## 2017-10-13 NOTE — Patient Instructions (Signed)
Medication Instructions:  Your physician has recommended you make the following change in your medication:  Increase Atorvastatin to 40 mg Daily    Labwork: NONE   Testing/Procedures: NONE   Follow-Up: Your physician wants you to follow-up in: 6 Months. You will receive a reminder letter in the mail two months in advance. If you don't receive a letter, please call our office to schedule the follow-up appointment.   Any Other Special Instructions Will Be Listed Below (If Applicable).     If you need a refill on your cardiac medications before your next appointment, please call your pharmacy. Thank you for choosing [facility]!

## 2017-10-13 NOTE — Progress Notes (Signed)
Clinical Summary Mr. Cueto is a 67 y.o.male seen today for follow up of the following medical problems.  1. Afib  - no palpitaitons. No bleeding on eliquis.   2. CAD - denies any chest pain. No SOB but some recent sinus congestion.   3. HTN - compliant with meds  4. PAD - s/p repair of juxtarenal abdominal aortic aneurysm, left renal artery bypass, followed by vascular   5. Chronic sysotlic HF/ICM - ehco 07/4194 LVEF 35-40% - Jan 2019 echo LVEF 50%. Abnormal diastolic function - Occasional SOB with laying down. Has had some recent LE edema - compliant with meds  - last visit had some evidence of fluid overload. Started on lasix 40mg  daily. Had uptrend in Cr, we then changed lasix to 40mg  alternating with 20mg  daily.  - edema has improved.  -   6. HL  - 08/2017 TC 142 TG 199 HDL 33 LDL 79 - compliant with statin  7. CKD - followed at [facility]  Past Medical History:  Diagnosis Date  . AAA (abdominal aortic aneurysm) ([place])    a. s/p repair 06/2015 with left renal artery bypass at that time, post-op course c/b renal failure requiring dialysis, C dif.  . Anemia   . Arteriosclerotic cardiovascular disease (ASCVD)    a. AMI in 2000 treated at [facility]. b. cath in 12/2006->  Chronic total obstruction of the RCA;  drug-eluting stent placed in OM1, LVEF abnormal.  . Cerebrovascular disease 2002   carotid stent  . Chronic anticoagulation   . Chronic combined systolic and diastolic CHF (congestive heart failure) ([place])   . CKD (chronic kidney disease), stage IV ([place])   . GERD (gastroesophageal reflux disease)   . Hyperlipidemia   . Hypertension   . Myocardial infarction ([place]) 10 yrs ago  . Nephrolithiasis   . Permanent atrial fibrillation ([place])   . PVD (peripheral vascular disease) ([place])    Ct angiogram in 2009 revealed stable disease with 80% celiac stenosis,50% right renal artery ,ASCVD with ulceration in the abdominal aortashe  . Testicular carcinoma  ([place]) 1990   right orchiectomy  . Tobacco abuse, in remission    20 pack years; quit in 2009     No Known Allergies   Current Outpatient Medications  Medication Sig Dispense Refill  . albuterol (PROVENTIL HFA;VENTOLIN HFA) 108 (90 BASE) MCG/ACT inhaler Inhale 2-4 puffs into the lungs every 4 (four) hours as needed for wheezing or shortness of breath. 1 Inhaler 0  . amLODipine (NORVASC) 10 MG tablet TAKE 1 TABLET BY MOUTH ONCE DAILY 90 tablet 0  . apixaban (ELIQUIS) 5 MG TABS tablet Take 1 tablet (5 mg total) by mouth 2 (two) times daily. 60 tablet 6  . atorvastatin (LIPITOR) 20 MG tablet Take 1 tablet (20 mg total) by mouth at bedtime. 90 tablet 3  . atorvastatin (LIPITOR) 20 MG tablet TAKE 1 TABLET BY MOUTH AT BEDTIME. 30 tablet 3  . Ferrous Sulfate (IRON) 325 (65 Fe) MG TABS Take 1 tablet (325 mg total) by mouth 3 (three) times daily with meals. 90 tablet 2  . fexofenadine (ALLEGRA) 180 MG tablet Take 1 tablet (180 mg total) by mouth daily. 30 tablet 2  . furosemide (LASIX) 40 MG tablet Alternate 40 mg one day and 20 mg the next 90 tablet 3  . hydrALAZINE (APRESOLINE) 25 MG tablet Take 1 tablet (25 mg total) by mouth 3 (three) times daily. 270 tablet 3  . lisinopril (PRINIVIL,ZESTRIL) 40 MG tablet Take  1 tablet (40 mg total) by mouth daily. 90 tablet 0  . metoprolol succinate (TOPROL XL) 100 MG 24 hr tablet Take 1 and 1/2 tablets (150 mg ) daily 135 tablet 3  . Multiple Vitamin (ONE-A-DAY MENS PO) Take 1 tablet by mouth daily.    . nitroGLYCERIN (NITROSTAT) 0.4 MG SL tablet place 1 tablet under the tongue if needed every 5 minutes for chest pain for 3 doses IF NO RELIEF AFTER 3RD DOSE CALL PRESCRIBER OR 911. 25 tablet 2  . Nutritional Supplements (FEEDING SUPPLEMENT, NEPRO CARB STEADY,) LIQD Take 237 mLs by mouth 2 (two) times daily between meals.  0  . Omega-3 Fatty Acids (FISH OIL) 1000 MG CAPS Take 1,000 mg by mouth daily.   0  . pantoprazole (PROTONIX) 40 MG tablet Take 1 tablet (40  mg total) by mouth daily. 90 tablet 2  . sodium bicarbonate 650 MG tablet Take 1 tablet (650 mg total) by mouth 3 (three) times daily. 270 tablet 1  . tamsulosin (FLOMAX) 0.4 MG CAPS capsule TAKE 1 CAPSULE BY MOUTH ONCE DAILY AFTER SUPPER 90 capsule 2  . traZODone (DESYREL) 50 MG tablet Take 1 tablet (50 mg total) by mouth at bedtime as needed. for sleep. Requires office visit before any further refills can be given. 30 tablet 0   No current facility-administered medications for this visit.      Past Surgical History:  Procedure Laterality Date  . ABDOMINAL AORTIC ANEURYSM REPAIR N/A 06/09/2015   Procedure: ANEURYSM ABDOMINAL AORTIC REPAIR;  Surgeon: Elam Dutch, MD;  Location: [facility] OR;  Service: Vascular;  Laterality: N/A;  . AORTIC ENDARTERECETOMY N/A 06/09/2015   Procedure: AORTIC ENDARTERECETOMY;  Surgeon: Elam Dutch, MD;  Location: [place];  Service: Vascular;  Laterality: N/A;  . AORTIC/RENAL BYPASS Left 06/09/2015   Procedure: LEFT RENAL Artery BYPASS;  Surgeon: Elam Dutch, MD;  Location: [place];  Service: Vascular;  Laterality: Left;  . APPENDECTOMY  2004  . COLONOSCOPY  06/17/2011   INCOMPLETE, PREP POOR. Procedure: COLONOSCOPY;  Surgeon: Daneil Dolin, MD;  [facility];  Service: Endoscopy;  Laterality: N/A;  10:00  . COLONOSCOPY  07/15/2011   CZY:[id number] rectal and colon polyps  . COLONOSCOPY N/A 05/22/2015   FUX:[id number] colonic and rectal polyps. tubular adenomas.repeat TCS 05/2018  . ESOPHAGEAL DILATION N/A 05/22/2015   Procedure: ESOPHAGEAL DILATION;  Surgeon: Daneil Dolin, MD;  [facility];  Service: Endoscopy;  Laterality: N/A;  . ESOPHAGOGASTRODUODENOSCOPY  06/17/2011   severe erosive/ulcerative reflux esophagitis, soft noncritical stricture dilatied, small hh, antral erosion   . ESOPHAGOGASTRODUODENOSCOPY N/A 05/22/2015   RMR: 2 cm HH otherwise normal. s/p empirical dilation.  . INSERTION OF DIALYSIS CATHETER Left 06/26/2015   Procedure:  INSERTION OF DIALYSIS CATHETER;  Surgeon: Angelia Mould, MD;  Location: [place];  Service: Vascular;  Laterality: Left;  . PERIPHERAL VASCULAR CATHETERIZATION N/A 05/28/2015   Procedure: Abdominal Aortogram;  Surgeon: Serafina Mitchell, MD;  Location: [place] CV LAB;  Service: Cardiovascular;  Laterality: N/A;  . testicular cancer  1990   right orchiectomy     No Known Allergies    Family History  Problem Relation Age of Onset  . Colon cancer Father 34       deceased  . Prostate cancer Father   . Heart disease Mother   . Liver disease Neg Hx   . Anesthesia problems Neg Hx   . Hypotension Neg Hx   . Malignant hyperthermia  Neg Hx   . Pseudochol deficiency Neg Hx      Social History Mr. Moure reports that he has been smoking cigarettes.  He has a 4.00 pack-year smoking history. He has never used smokeless tobacco. Mr. Montesdeoca reports that he does not drink alcohol.   Review of Systems CONSTITUTIONAL: No weight loss, fever, chills, weakness or fatigue.  HEENT: Eyes: No visual loss, blurred vision, double vision or yellow sclerae.No hearing loss, sneezing, congestion, runny nose or sore throat.  SKIN: No rash or itching.  CARDIOVASCULAR: per hpi RESPIRATORY: per hpi GASTROINTESTINAL: No anorexia, nausea, vomiting or diarrhea. No abdominal pain or blood.  GENITOURINARY: No burning on urination, no polyuria NEUROLOGICAL: No headache, dizziness, syncope, paralysis, ataxia, numbness or tingling in the extremities. No change in bowel or bladder control.  MUSCULOSKELETAL: No muscle, back pain, joint pain or stiffness.  LYMPHATICS: No enlarged nodes. No history of splenectomy.  PSYCHIATRIC: No history of depression or anxiety.  ENDOCRINOLOGIC: No reports of sweating, cold or heat intolerance. No polyuria or polydipsia.  [name]   Physical Examination Vitals:   10/13/17 0903  BP: 130/70  Pulse: 62  SpO2: 98%   Vitals:   10/13/17 0903  Weight: 180 lb 9.6 oz (81.9 kg)    Height: 6' (1.829 m)    Gen: resting comfortably, no acute distress HEENT: no scleral icterus, pupils equal round and reactive, no palptable cervical adenopathy,  CV: RRR, no m/r/g, no jvd Resp: Clear to auscultation bilaterally GI: abdomen is soft, non-tender, non-distended, normal bowel sounds, no hepatosplenomegaly MSK: extremities are warm, no edema.  Skin: warm, no rash Neuro:  no focal deficits Psych: appropriate affect   Diagnostic Studies 06/2015 echo - Left ventricle: The cavity size was normal. Systolic function was  moderately reduced. The estimated ejection fraction was in the  range of 35% to 40%. - Pulmonary arteries: PA peak pressure: 33 mm Hg (S). - Inferior vena cava: The vessel was dilated. The respirophasic  diameter changes were blunted (<50%). This is a nonspecific  finding during positive pressure ventilation.  Impressions:  - Technically limited study due to poor echo windows and  tachycardia. Consider TEE if more accurate evaluation is  necessary.   Jan 2019 echo  Study Conclusions  - Left ventricle: The cavity size was normal. Wall thickness was increased in a pattern of mild LVH. Systolic function was at the lower limits of normal. The estimated ejection fraction was 50%. Diffuse hypokinesis. The study was not technically sufficient to allow evaluation of LV diastolic dysfunction due to atrial fibrillation. Doppler parameters are consistent with high ventricular filling pressure. - Aortic valve: Trileaflet; mildly thickened leaflets. - Mitral valve: There was mild regurgitation. - Left atrium: The atrium was moderately dilated. - Right atrium: The atrium was moderately dilated.     Assessment and Plan  1. Afib - CHADS2Vasc score is 3. Continue anticoag - no symptoms, continue current meds  2. CAD - no symptoms, continue current meds  3. HTN At goal, conitnue curent meds  4. Ischemic  cardiopmyopathy -LVEF has normalized - no significant symptoms, conitnue current meds  5. Hyperlipidemia - above goal of 70, increase atorva to 40mg  daily.          Arnoldo Lenis, M.D.

## 2017-10-18 ENCOUNTER — Other Ambulatory Visit: Payer: Self-pay | Admitting: Family Medicine

## 2017-10-20 ENCOUNTER — Encounter: Payer: Self-pay | Admitting: Cardiology

## 2017-11-04 ENCOUNTER — Other Ambulatory Visit: Payer: Self-pay | Admitting: Cardiology

## 2017-11-18 DIAGNOSIS — N183 Chronic kidney disease, stage 3 (moderate): Secondary | ICD-10-CM | POA: Diagnosis not present

## 2017-11-18 DIAGNOSIS — D631 Anemia in chronic kidney disease: Secondary | ICD-10-CM | POA: Diagnosis not present

## 2017-11-18 DIAGNOSIS — N2581 Secondary hyperparathyroidism of renal origin: Secondary | ICD-10-CM | POA: Diagnosis not present

## 2017-11-18 DIAGNOSIS — N179 Acute kidney failure, unspecified: Secondary | ICD-10-CM | POA: Diagnosis not present

## 2017-11-18 DIAGNOSIS — I129 Hypertensive chronic kidney disease with stage 1 through stage 4 chronic kidney disease, or unspecified chronic kidney disease: Secondary | ICD-10-CM | POA: Diagnosis not present

## 2017-12-20 ENCOUNTER — Ambulatory Visit: Payer: Medicare Other | Admitting: Family Medicine

## 2017-12-27 ENCOUNTER — Encounter: Payer: Self-pay | Admitting: Family Medicine

## 2018-01-01 ENCOUNTER — Other Ambulatory Visit: Payer: Self-pay | Admitting: Family Medicine

## 2018-01-26 ENCOUNTER — Other Ambulatory Visit: Payer: Self-pay | Admitting: Family Medicine

## 2018-02-06 ENCOUNTER — Telehealth: Payer: Self-pay | Admitting: Cardiology

## 2018-02-06 NOTE — Telephone Encounter (Signed)
Pt is out of his apixaban (ELIQUIS) 5 MG TABS tablet [[id number]]  And doesn't have any $ to pay to pick it up from the pharmacy would like to know if he can have some samples.

## 2018-02-06 NOTE — Telephone Encounter (Signed)
Pt made aware that I will put samples in front office for him.

## 2018-04-04 ENCOUNTER — Other Ambulatory Visit: Payer: Self-pay | Admitting: Family Medicine

## 2018-04-24 ENCOUNTER — Encounter: Payer: Self-pay | Admitting: Internal Medicine

## 2018-05-09 ENCOUNTER — Ambulatory Visit (INDEPENDENT_AMBULATORY_CARE_PROVIDER_SITE_OTHER): Payer: Medicare HMO | Admitting: Cardiology

## 2018-05-09 ENCOUNTER — Encounter: Payer: Self-pay | Admitting: Cardiology

## 2018-05-09 VITALS — BP 144/80 | HR 74 | Ht 72.0 in | Wt 179.0 lb

## 2018-05-09 DIAGNOSIS — I2510 Atherosclerotic heart disease of native coronary artery without angina pectoris: Secondary | ICD-10-CM | POA: Diagnosis not present

## 2018-05-09 DIAGNOSIS — I4891 Unspecified atrial fibrillation: Secondary | ICD-10-CM | POA: Diagnosis not present

## 2018-05-09 DIAGNOSIS — E782 Mixed hyperlipidemia: Secondary | ICD-10-CM | POA: Diagnosis not present

## 2018-05-09 DIAGNOSIS — I10 Essential (primary) hypertension: Secondary | ICD-10-CM

## 2018-05-09 MED ORDER — HYDRALAZINE HCL 25 MG PO TABS: 37.5000 mg | ORAL_TABLET | Freq: Three times a day (TID) | ORAL | 3 refills | Status: DC

## 2018-05-09 NOTE — Progress Notes (Signed)
Clinical Summary Mr. Caraway is a 68 y.o.male seen today for follow up of the following medical problems.  1. Afib - no recent palpitations - he is compliant with meds including eliquis.   2. CAD - remote cath at [facility]. History of RCA CTO, received stent to OM1.  - denies any chest pain. No SOB but some recent sinus congestion.    - no chest pain, no SOB or DOE  3. HTN - he is compliant with meds.   4. PAD - s/p repair of juxtarenal abdominal aortic aneurysm, left renal artery bypass, followed by vascular   5. Chronic sysotlic HF/ICM - ehco 05/9415 LVEF 35-40% - Jan 2019 echo LVEF 50%. Abnormal diastolic function - Occasional SOB with laying down.Has had some recent LE edema - compliant with meds  - lasix 40mg  daily led to [facility], has done well with 40mg  alternating days with 20mg  - no recent edema    6. HL - last visit we increased his atorvastatin to 40mg  daily since his LDL>70 - has not had repeat panel since - compliant with statin  7. CKD - followed at [facility]   Past Medical History:  Diagnosis Date  . AAA (abdominal aortic aneurysm) ([place])    a. s/p repair 06/2015 with left renal artery bypass at that time, post-op course c/b renal failure requiring dialysis, C dif.  . Anemia   . Arteriosclerotic cardiovascular disease (ASCVD)    a. AMI in 2000 treated at [facility]. b. cath in 12/2006->  Chronic total obstruction of the RCA;  drug-eluting stent placed in OM1, LVEF abnormal.  . Cerebrovascular disease 2002   carotid stent  . Chronic anticoagulation   . Chronic combined systolic and diastolic CHF (congestive heart failure) ([place])   . CKD (chronic kidney disease), stage IV ([place])   . GERD (gastroesophageal reflux disease)   . Hyperlipidemia   . Hypertension   . Myocardial infarction ([place]) 10 yrs ago  . Nephrolithiasis   . Permanent atrial fibrillation ([place])   . PVD (peripheral vascular disease) ([place])    Ct angiogram in 2009 revealed  stable disease with 80% celiac stenosis,50% right renal artery ,ASCVD with ulceration in the abdominal aortashe  . Testicular carcinoma ([place]) 1990   right orchiectomy  . Tobacco abuse, in remission    20 pack years; quit in 2009     No Known Allergies   Current Outpatient Medications  Medication Sig Dispense Refill  . albuterol (PROVENTIL HFA;VENTOLIN HFA) 108 (90 BASE) MCG/ACT inhaler Inhale 2-4 puffs into the lungs every 4 (four) hours as needed for wheezing or shortness of breath. 1 Inhaler 0  . amLODipine (NORVASC) 10 MG tablet TAKE 1 TABLET BY MOUTH ONCE DAILY 90 tablet 0  . apixaban (ELIQUIS) 5 MG TABS tablet Take 1 tablet (5 mg total) by mouth 2 (two) times daily. 60 tablet 6  . atorvastatin (LIPITOR) 40 MG tablet Take 1 tablet (40 mg total) by mouth daily. 90 tablet 3  . Ferrous Sulfate (IRON) 325 (65 Fe) MG TABS TAKE 1 TABLET BY MOUTH THREE TIMES DAILY WITH MEALS 270 tablet 0  . fexofenadine (ALLEGRA) 180 MG tablet Take 1 tablet (180 mg total) by mouth daily. 30 tablet 2  . furosemide (LASIX) 40 MG tablet Alternate 40 mg one day and 20 mg the next 90 tablet 3  . hydrALAZINE (APRESOLINE) 25 MG tablet Take 1 tablet (25 mg total) by mouth 3 (three) times daily. 270 tablet 3  . lisinopril (PRINIVIL,ZESTRIL)  40 MG tablet Take 1 tablet (40 mg total) by mouth daily. 90 tablet 0  . metoprolol succinate (TOPROL-XL) 100 MG 24 hr tablet TAKE 1 & 1/2 (ONE & ONE-HALF) TABLETS BY MOUTH ONCE DAILY 135 tablet 3  . Multiple Vitamin (ONE-A-DAY MENS PO) Take 1 tablet by mouth daily.    . nitroGLYCERIN (NITROSTAT) 0.4 MG SL tablet place 1 tablet under the tongue if needed every 5 minutes for chest pain for 3 doses IF NO RELIEF AFTER 3RD DOSE CALL PRESCRIBER OR 911. 25 tablet 2  . Omega-3 Fatty Acids (FISH OIL) 1000 MG CAPS Take 1,000 mg by mouth daily.   0  . pantoprazole (PROTONIX) 40 MG tablet Take 1 tablet (40 mg total) by mouth daily. 90 tablet 2  . sodium bicarbonate 650 MG tablet Take 1 tablet  (650 mg total) by mouth 3 (three) times daily. 270 tablet 1  . tamsulosin (FLOMAX) 0.4 MG CAPS capsule TAKE 1 CAPSULE BY MOUTH ONCE DAILY AFTER SUPPER 90 capsule 2  . traZODone (DESYREL) 50 MG tablet Take 1 tablet (50 mg total) by mouth at bedtime as needed. for sleep. Requires office visit before any further refills can be given. 30 tablet 0   No current facility-administered medications for this visit.      Past Surgical History:  Procedure Laterality Date  . ABDOMINAL AORTIC ANEURYSM REPAIR N/A 06/09/2015   Procedure: ANEURYSM ABDOMINAL AORTIC REPAIR;  Surgeon: Elam Dutch, MD;  Location: [facility] OR;  Service: Vascular;  Laterality: N/A;  . AORTIC ENDARTERECETOMY N/A 06/09/2015   Procedure: AORTIC ENDARTERECETOMY;  Surgeon: Elam Dutch, MD;  Location: [place];  Service: Vascular;  Laterality: N/A;  . AORTIC/RENAL BYPASS Left 06/09/2015   Procedure: LEFT RENAL Artery BYPASS;  Surgeon: Elam Dutch, MD;  Location: [place];  Service: Vascular;  Laterality: Left;  . APPENDECTOMY  2004  . COLONOSCOPY  06/17/2011   INCOMPLETE, PREP POOR. Procedure: COLONOSCOPY;  Surgeon: Daneil Dolin, MD;  [facility];  Service: Endoscopy;  Laterality: N/A;  10:00  . COLONOSCOPY  07/15/2011   OVF:[id number] rectal and colon polyps  . COLONOSCOPY N/A 05/22/2015   OAC:[id number] colonic and rectal polyps. tubular adenomas.repeat TCS 05/2018  . ESOPHAGEAL DILATION N/A 05/22/2015   Procedure: ESOPHAGEAL DILATION;  Surgeon: Daneil Dolin, MD;  [facility];  Service: Endoscopy;  Laterality: N/A;  . ESOPHAGOGASTRODUODENOSCOPY  06/17/2011   severe erosive/ulcerative reflux esophagitis, soft noncritical stricture dilatied, small hh, antral erosion   . ESOPHAGOGASTRODUODENOSCOPY N/A 05/22/2015   RMR: 2 cm HH otherwise normal. s/p empirical dilation.  . INSERTION OF DIALYSIS CATHETER Left 06/26/2015   Procedure: INSERTION OF DIALYSIS CATHETER;  Surgeon: Angelia Mould, MD;  Location: [place];   Service: Vascular;  Laterality: Left;  . PERIPHERAL VASCULAR CATHETERIZATION N/A 05/28/2015   Procedure: Abdominal Aortogram;  Surgeon: Serafina Mitchell, MD;  Location: [place] CV LAB;  Service: Cardiovascular;  Laterality: N/A;  . testicular cancer  1990   right orchiectomy     No Known Allergies    Family History  Problem Relation Age of Onset  . Colon cancer Father 76       deceased  . Prostate cancer Father   . Heart disease Mother   . Liver disease Neg Hx   . Anesthesia problems Neg Hx   . Hypotension Neg Hx   . Malignant hyperthermia Neg Hx   . Pseudochol deficiency Neg Hx      Social History Mr.  Negro reports that he has been smoking cigarettes. He has a 4.00 pack-year smoking history. He has never used smokeless tobacco. Mr. Degollado reports no history of alcohol use.   Review of Systems CONSTITUTIONAL: No weight loss, fever, chills, weakness or fatigue.  HEENT: Eyes: No visual loss, blurred vision, double vision or yellow sclerae.No hearing loss, sneezing, congestion, runny nose or sore throat.  SKIN: No rash or itching.  CARDIOVASCULAR: per hpi RESPIRATORY: No shortness of breath, cough or sputum.  GASTROINTESTINAL: No anorexia, nausea, vomiting or diarrhea. No abdominal pain or blood.  GENITOURINARY: No burning on urination, no polyuria NEUROLOGICAL: No headache, dizziness, syncope, paralysis, ataxia, numbness or tingling in the extremities. No change in bowel or bladder control.  MUSCULOSKELETAL: No muscle, back pain, joint pain or stiffness.  LYMPHATICS: No enlarged nodes. No history of splenectomy.  PSYCHIATRIC: No history of depression or anxiety.  ENDOCRINOLOGIC: No reports of sweating, cold or heat intolerance. No polyuria or polydipsia.  [name]   Physical Examination Vitals:   05/09/18 1041  BP: (!) 144/80  Pulse: 74  SpO2: 98%   Vitals:   05/09/18 1041  Weight: 179 lb (81.2 kg)  Height: 6' (1.829 m)    Gen: resting comfortably, no acute  distress HEENT: no scleral icterus, pupils equal round and reactive, no palptable cervical adenopathy,  CV: irreg, no m/r/g, no jvd Resp: Clear to auscultation bilaterally GI: abdomen is soft, non-tender, non-distended, normal bowel sounds, no hepatosplenomegaly MSK: extremities are warm, no edema.  Skin: warm, no rash Neuro:  no focal deficits Psych: appropriate affect   Diagnostic Studies 06/2015 echo - Left ventricle: The cavity size was normal. Systolic function was  moderately reduced. The estimated ejection fraction was in the  range of 35% to 40%. - Pulmonary arteries: PA peak pressure: 33 mm Hg (S). - Inferior vena cava: The vessel was dilated. The respirophasic  diameter changes were blunted (<50%). This is a nonspecific  finding during positive pressure ventilation.  Impressions:  - Technically limited study due to poor echo windows and  tachycardia. Consider TEE if more accurate evaluation is  necessary.   Jan 2019 echo Study Conclusions  - Left ventricle: The cavity size was normal. Wall thickness was increased in a pattern of mild LVH. Systolic function was at the lower limits of normal. The estimated ejection fraction was 50%. Diffuse hypokinesis. The study was not technically sufficient to allow evaluation of LV diastolic dysfunction due to atrial fibrillation. Doppler parameters are consistent with high ventricular filling pressure. - Aortic valve: Trileaflet; mildly thickened leaflets. - Mitral valve: There was mild regurgitation. - Left atrium: The atrium was moderately dilated. - Right atrium: The atrium was moderately dilated.    Assessment and Plan  1. Afib - no recent symptoms, continue current meds including anticoagulation  2. CAD -no recent symptoms, continue current meds  3. HTN - above goal of 130/80, increase hydralazine to 37.5mg  tid  4. Ischemic cardiopmyopathy -LVEF has normalized - doing well, continue  current meds  5. Hyperlipidemia - repeat lipid panel and CMET since increasing his atorvastatin last visit   F/u 6 months      Arnoldo Lenis, M.D

## 2018-05-09 NOTE — Patient Instructions (Signed)
Medication Instructions:  Increase hydralazine to 37.5 mg- three times daily   Labwork: asap  Fasting lipid cmet  Testing/Procedures: none  Follow-Up: Your physician wants you to follow-up in: 6 months.  You will receive a reminder letter in the mail two months in advance. If you don't receive a letter, please call our office to schedule the follow-up appointment.   Any Other Special Instructions Will Be Listed Below (If Applicable).     If you need a refill on your cardiac medications before your next appointment, please call your pharmacy.

## 2018-05-11 ENCOUNTER — Other Ambulatory Visit: Payer: Self-pay | Admitting: Family Medicine

## 2018-05-11 ENCOUNTER — Other Ambulatory Visit: Payer: Self-pay | Admitting: Cardiology

## 2018-06-22 ENCOUNTER — Other Ambulatory Visit: Payer: Self-pay | Admitting: Cardiology

## 2018-07-10 ENCOUNTER — Other Ambulatory Visit: Payer: Self-pay | Admitting: Family Medicine

## 2018-07-10 ENCOUNTER — Other Ambulatory Visit: Payer: Self-pay

## 2018-07-10 ENCOUNTER — Other Ambulatory Visit: Payer: Self-pay | Admitting: Cardiology

## 2018-07-10 MED ORDER — FUROSEMIDE 40 MG PO TABS: ORAL_TABLET | ORAL | 3 refills | Status: DC

## 2018-07-10 NOTE — Telephone Encounter (Signed)
Refilled lasix to [facility]

## 2018-07-14 ENCOUNTER — Emergency Department (HOSPITAL_COMMUNITY)
Admission: EM | Admit: 2018-07-14 | Discharge: 2018-07-15 | Disposition: A | Discharge status: Home / Self Care | Payer: Medicare HMO | Attending: Emergency Medicine | Admitting: Emergency Medicine

## 2018-07-14 ENCOUNTER — Encounter (HOSPITAL_COMMUNITY): Payer: Self-pay

## 2018-07-14 ENCOUNTER — Other Ambulatory Visit: Payer: Self-pay

## 2018-07-14 DIAGNOSIS — F17210 Nicotine dependence, cigarettes, uncomplicated: Secondary | ICD-10-CM | POA: Diagnosis not present

## 2018-07-14 DIAGNOSIS — I4891 Unspecified atrial fibrillation: Secondary | ICD-10-CM | POA: Diagnosis not present

## 2018-07-14 DIAGNOSIS — R1011 Right upper quadrant pain: Secondary | ICD-10-CM | POA: Insufficient documentation

## 2018-07-14 DIAGNOSIS — I491 Atrial premature depolarization: Secondary | ICD-10-CM | POA: Diagnosis not present

## 2018-07-14 DIAGNOSIS — R079 Chest pain, unspecified: Secondary | ICD-10-CM | POA: Diagnosis not present

## 2018-07-14 DIAGNOSIS — N184 Chronic kidney disease, stage 4 (severe): Secondary | ICD-10-CM | POA: Diagnosis not present

## 2018-07-14 DIAGNOSIS — K8020 Calculus of gallbladder without cholecystitis without obstruction: Secondary | ICD-10-CM | POA: Diagnosis not present

## 2018-07-14 DIAGNOSIS — I130 Hypertensive heart and chronic kidney disease with heart failure and stage 1 through stage 4 chronic kidney disease, or unspecified chronic kidney disease: Secondary | ICD-10-CM | POA: Diagnosis not present

## 2018-07-14 DIAGNOSIS — I714 Abdominal aortic aneurysm, without rupture: Secondary | ICD-10-CM | POA: Diagnosis not present

## 2018-07-14 DIAGNOSIS — N200 Calculus of kidney: Secondary | ICD-10-CM | POA: Diagnosis not present

## 2018-07-14 DIAGNOSIS — Z8547 Personal history of malignant neoplasm of testis: Secondary | ICD-10-CM | POA: Diagnosis not present

## 2018-07-14 DIAGNOSIS — J439 Emphysema, unspecified: Secondary | ICD-10-CM | POA: Diagnosis not present

## 2018-07-14 DIAGNOSIS — I5042 Chronic combined systolic (congestive) and diastolic (congestive) heart failure: Secondary | ICD-10-CM | POA: Insufficient documentation

## 2018-07-14 DIAGNOSIS — M5489 Other dorsalgia: Secondary | ICD-10-CM | POA: Diagnosis not present

## 2018-07-14 DIAGNOSIS — R109 Unspecified abdominal pain: Secondary | ICD-10-CM | POA: Diagnosis present

## 2018-07-14 DIAGNOSIS — I499 Cardiac arrhythmia, unspecified: Secondary | ICD-10-CM | POA: Diagnosis not present

## 2018-07-14 DIAGNOSIS — I252 Old myocardial infarction: Secondary | ICD-10-CM | POA: Diagnosis not present

## 2018-07-14 DIAGNOSIS — R1084 Generalized abdominal pain: Secondary | ICD-10-CM | POA: Diagnosis not present

## 2018-07-14 NOTE — ED Triage Notes (Signed)
Pt reports lower abd pain x 3 days, states started after eating chili.  Pt had some diarrhea, no vomiting.  Pt has history of AAA

## 2018-07-15 ENCOUNTER — Emergency Department (HOSPITAL_COMMUNITY): Payer: Medicare HMO

## 2018-07-15 DIAGNOSIS — J439 Emphysema, unspecified: Secondary | ICD-10-CM | POA: Diagnosis not present

## 2018-07-15 DIAGNOSIS — K8020 Calculus of gallbladder without cholecystitis without obstruction: Secondary | ICD-10-CM | POA: Diagnosis not present

## 2018-07-15 DIAGNOSIS — I714 Abdominal aortic aneurysm, without rupture: Secondary | ICD-10-CM | POA: Diagnosis not present

## 2018-07-15 DIAGNOSIS — N200 Calculus of kidney: Secondary | ICD-10-CM | POA: Diagnosis not present

## 2018-07-15 LAB — CBC WITH DIFFERENTIAL/PLATELET
Abs Immature Granulocytes: 0.02 10*3/uL (ref 0.00–0.07)
Basophils Absolute: 0 10*3/uL (ref 0.0–0.1)
Basophils Relative: 0 %
Eosinophils Absolute: 0.1 10*3/uL (ref 0.0–0.5)
Eosinophils Relative: 1 %
HCT: 37.8 % — ABNORMAL LOW (ref 39.0–52.0)
Hemoglobin: 12.6 g/dL — ABNORMAL LOW (ref 13.0–17.0)
Immature Granulocytes: 0 %
Lymphocytes Relative: 13 %
Lymphs Abs: 1.1 10*3/uL (ref 0.7–4.0)
MCH: 29.3 pg (ref 26.0–34.0)
MCHC: 33.3 g/dL (ref 30.0–36.0)
MCV: 87.9 fL (ref 80.0–100.0)
Monocytes Absolute: 0.8 10*3/uL (ref 0.1–1.0)
Monocytes Relative: 9 %
Neutro Abs: 6.4 10*3/uL (ref 1.7–7.7)
Neutrophils Relative %: 77 %
Platelets: 268 10*3/uL (ref 150–400)
RBC: 4.3 MIL/uL (ref 4.22–5.81)
RDW: 13.9 % (ref 11.5–15.5)
WBC: 8.4 10*3/uL (ref 4.0–10.5)
nRBC: 0 % (ref 0.0–0.2)

## 2018-07-15 LAB — COMPREHENSIVE METABOLIC PANEL
ALT: 10 U/L (ref 0–44)
AST: 12 U/L — ABNORMAL LOW (ref 15–41)
Albumin: 2.8 g/dL — ABNORMAL LOW (ref 3.5–5.0)
Alkaline Phosphatase: 105 U/L (ref 38–126)
Anion gap: 9 (ref 5–15)
BUN: 22 mg/dL (ref 8–23)
CO2: 20 mmol/L — ABNORMAL LOW (ref 22–32)
Calcium: 9.2 mg/dL (ref 8.9–10.3)
Chloride: 106 mmol/L (ref 98–111)
Creatinine, Ser: 2.05 mg/dL — ABNORMAL HIGH (ref 0.61–1.24)
GFR calc Af Amer: 38 mL/min — ABNORMAL LOW (ref >60)
GFR calc non Af Amer: 33 mL/min — ABNORMAL LOW (ref >60)
Glucose, Bld: 117 mg/dL — ABNORMAL HIGH (ref 70–99)
Potassium: 3.4 mmol/L — ABNORMAL LOW (ref 3.5–5.1)
Sodium: 135 mmol/L (ref 135–145)
Total Bilirubin: 0.3 mg/dL (ref 0.3–1.2)
Total Protein: 7.2 g/dL (ref 6.5–8.1)

## 2018-07-15 LAB — LIPASE, BLOOD: Lipase: 45 U/L (ref 11–51)

## 2018-07-15 MED ORDER — FENTANYL CITRATE (PF) 100 MCG/2ML IJ SOLN
50.0000 ug | Freq: Once | INTRAMUSCULAR | Status: AC
  Administered 2018-07-15: 50 ug via INTRAVENOUS

## 2018-07-15 MED ORDER — FENTANYL CITRATE (PF) 100 MCG/2ML IJ SOLN
INTRAMUSCULAR | Status: AC
  Filled 2018-07-15: qty 2

## 2018-07-15 MED ORDER — ONDANSETRON HCL 4 MG/2ML IJ SOLN
4.0000 mg | Freq: Once | INTRAMUSCULAR | Status: AC
  Administered 2018-07-15: 4 mg via INTRAVENOUS

## 2018-07-15 MED ORDER — ALUM & MAG HYDROXIDE-SIMETH 200-200-20 MG/5ML PO SUSP
30.0000 mL | Freq: Once | ORAL | Status: AC
  Administered 2018-07-15: 30 mL via ORAL
  Filled 2018-07-15: qty 30

## 2018-07-15 MED ORDER — ONDANSETRON HCL 4 MG PO TABS: 4.0000 mg | ORAL_TABLET | Freq: Three times a day (TID) | ORAL | 0 refills | Status: DC | PRN

## 2018-07-15 MED ORDER — OXYCODONE-ACETAMINOPHEN 5-325 MG PO TABS
1.0000 | ORAL_TABLET | Freq: Once | ORAL | Status: AC
  Administered 2018-07-15: 1 via ORAL
  Filled 2018-07-15: qty 1

## 2018-07-15 MED ORDER — OXYCODONE-ACETAMINOPHEN 5-325 MG PO TABS: 1.0000 | ORAL_TABLET | ORAL | 0 refills | Status: DC | PRN

## 2018-07-15 MED ORDER — ONDANSETRON HCL 4 MG/2ML IJ SOLN
INTRAMUSCULAR | Status: AC
  Administered 2018-07-15: 4 mg via INTRAVENOUS
  Filled 2018-07-15: qty 2

## 2018-07-15 MED ORDER — IOHEXOL 300 MG/ML  SOLN: 100.0000 mL | Freq: Once | INTRAMUSCULAR | Status: DC | PRN

## 2018-07-15 MED ORDER — LIDOCAINE VISCOUS HCL 2 % MT SOLN
15.0000 mL | Freq: Once | OROMUCOSAL | Status: AC
  Administered 2018-07-15: 15 mL via ORAL
  Filled 2018-07-15: qty 15

## 2018-07-15 NOTE — ED Provider Notes (Signed)
Emergency Department Provider Note   I have reviewed the triage vital signs and the nursing notes.   HISTORY  Chief Complaint Abdominal Pain   HPI [name] [name] is a 68 y.o. male with medical pons document below the presents emergency department today with abdominal pain.  Patient states that is been gone for 3 to 4 days after eating some chili.  At dull aching pain in his lower abdomen.  Has a history of AAA but been repaired.  No nausea, vomiting, constipation he did have one episode of diarrhea earlier today.  Patient's been eating and drinking normally since this started.  It has been unrelenting.  Has not tried anything to take for symptoms at home.  No trauma or rashes.  No fevers. No other associated or modifying symptoms.    Past Medical History:  Diagnosis Date  . AAA (abdominal aortic aneurysm) ([place])    a. s/p repair 06/2015 with left renal artery bypass at that time, post-op course c/b renal failure requiring dialysis, C dif.  . Anemia   . Arteriosclerotic cardiovascular disease (ASCVD)    a. AMI in 2000 treated at [facility]. b. cath in 12/2006->  Chronic total obstruction of the RCA;  drug-eluting stent placed in OM1, LVEF abnormal.  . Cerebrovascular disease 2002   carotid stent  . Chronic anticoagulation   . Chronic combined systolic and diastolic CHF (congestive heart failure) ([place])   . CKD (chronic kidney disease), stage IV ([place])   . GERD (gastroesophageal reflux disease)   . Hyperlipidemia   . Hypertension   . Myocardial infarction ([place]) 10 yrs ago  . Nephrolithiasis   . Permanent atrial fibrillation   . PVD (peripheral vascular disease) ([place])    Ct angiogram in 2009 revealed stable disease with 80% celiac stenosis,50% right renal artery ,ASCVD with ulceration in the abdominal aortashe  . Testicular carcinoma ([place]) 1990   right orchiectomy  . Tobacco abuse, in remission    20 pack years; quit in 2009    Patient Active Problem List   Diagnosis Date Noted   . Atrial fibrillation with RVR ([place])   . Acute on chronic combined systolic and diastolic CHF (congestive heart failure) ([place]) 06/19/2015  . Status post abdominal aortic aneurysm (AAA) repair   . History of CVA with residual deficit   . Pseudoaneurysm of aorta ([place]) 06/09/2015  . CAD - s/p PCI to Cx. 100% RCA CTO 05/28/2015  . Hiatal hernia   . History of colonic polyps   . Hx of adenomatous colonic polyps 05/14/2015  . Constipation 04/22/2015  . Insomnia 07/16/2014  . ED (erectile dysfunction) 06/17/2013  . Chronic kidney disease 09/28/2011  . GERD (gastroesophageal reflux disease) 06/01/2011  . Family history of colon cancer 05/20/2011  . Chronic anticoagulation 09/24/2010  . Tobacco abuse   . ATHEROSCLEROTIC CARDIOVASCULAR DISEASE 11/13/2009  . PERIPHERAL VASCULAR DISEASE 11/13/2009  . Hyperlipidemia 10/24/2008  . Testicular cancer ([place]) 10/22/2008  . Essential hypertension 10/22/2008    Past Surgical History:  Procedure Laterality Date  . ABDOMINAL AORTIC ANEURYSM REPAIR N/A 06/09/2015   Procedure: ANEURYSM ABDOMINAL AORTIC REPAIR;  Surgeon: Elam Dutch, MD;  Location: [place];  Service: Vascular;  Laterality: N/A;  . AORTIC ENDARTERECETOMY N/A 06/09/2015   Procedure: AORTIC ENDARTERECETOMY;  Surgeon: Elam Dutch, MD;  Location: [place];  Service: Vascular;  Laterality: N/A;  . AORTIC/RENAL BYPASS Left 06/09/2015   Procedure: LEFT RENAL Artery BYPASS;  Surgeon: Elam Dutch, MD;  Location: [place];  Service: Vascular;  Laterality: Left;  . APPENDECTOMY  2004  . COLONOSCOPY  06/17/2011   INCOMPLETE, PREP POOR. Procedure: COLONOSCOPY;  Surgeon: Daneil Dolin, MD;  [facility];  Service: Endoscopy;  Laterality: N/A;  10:00  . COLONOSCOPY  07/15/2011   XTK:WIOXBDZH rectal and colon polyps  . COLONOSCOPY N/A 05/22/2015   GDJ:MEQASTMH colonic and rectal polyps. tubular adenomas.repeat TCS 05/2018  . ESOPHAGEAL DILATION N/A 05/22/2015   Procedure: ESOPHAGEAL DILATION;   Surgeon: Daneil Dolin, MD;  [facility];  Service: Endoscopy;  Laterality: N/A;  . ESOPHAGOGASTRODUODENOSCOPY  06/17/2011   severe erosive/ulcerative reflux esophagitis, soft noncritical stricture dilatied, small hh, antral erosion   . ESOPHAGOGASTRODUODENOSCOPY N/A 05/22/2015   RMR: 2 cm HH otherwise normal. s/p empirical dilation.  . INSERTION OF DIALYSIS CATHETER Left 06/26/2015   Procedure: INSERTION OF DIALYSIS CATHETER;  Surgeon: Angelia Mould, MD;  Location: [place];  Service: Vascular;  Laterality: Left;  . PERIPHERAL VASCULAR CATHETERIZATION N/A 05/28/2015   Procedure: Abdominal Aortogram;  Surgeon: Serafina Mitchell, MD;  Location: [place] [facility];  Service: Cardiovascular;  Laterality: N/A;  . testicular cancer  1990   right orchiectomy    Current Outpatient Rx  . Order #: [id number] Class: Print  . Order #: [id number] Class: Normal  . Order #: [id number] Class: Normal  . Order #: [id number] Class: Normal  . Order #: [id number] Class: Normal  . Order #: [id number] Class: Normal  . Order #: [id number] Class: Normal  . Order #: [id number] Class: Normal  . Order #: [id number] Class: Normal  . Order #: [id number] Class: Normal  . Order #: [id number] Class: Historical Med  . Order #: [id number] Class: Normal  . Order #: [id number] Class: Historical Med  . Order #: [id number] Class: No Print  . Order #: [id number] Class: Print  . Order #: [id number] Class: Normal  . Order #: [id number] Class: Normal  . Order #: [id number] Class: Normal  . Order #: [id number] Class: Normal    Allergies Patient has no known allergies.  Family History  Problem Relation Age of Onset  . Colon cancer Father 63       deceased  . Prostate cancer Father   . Heart disease Mother   . Liver disease Neg Hx   . Anesthesia problems Neg Hx   . Hypotension Neg Hx   . Malignant hyperthermia Neg Hx   . Pseudochol deficiency Neg Hx     Social History Social History   Tobacco Use  . Smoking status: Current Some Day  Smoker    Packs/day: 0.10    Years: 40.00    Pack years: 4.00    Types: Cigarettes    Last attempt to quit: 05/24/2015    Years since quitting: 3.1  . Smokeless tobacco: Never Used  . Tobacco comment: 1/2 pack daily  Substance Use Topics  . Alcohol use: No    Alcohol/week: 0.0 standard drinks  . Drug use: No    Review of Systems  All other systems negative except as documented in the HPI. All pertinent positives and negatives as reviewed in the HPI. ____________________________________________   PHYSICAL EXAM:  VITAL SIGNS: ED Triage Vitals  Enc Vitals Group     BP 07/14/18 2355 (!) 153/76     Pulse Rate 07/14/18 2355 72     Resp 07/14/18 2355 20     Temp 07/14/18 2356 98.3 F (36.8 C)     Temp Source 07/14/18 2356 Oral     SpO2 07/14/18 2355 98 %     Weight 07/14/18 2350 179  lb (81.2 kg)     Height 07/14/18 2350 6' (1.829 m)     Head Circumference --      Peak Flow --      Pain Score 07/14/18 2350 8     Pain Loc --      Pain Edu? --      Excl. in [place]? --     Constitutional: Alert and oriented. Well appearing and in no acute distress. Eyes: Conjunctivae are normal. PERRL. EOMI. Head: Atraumatic. Nose: No congestion/rhinnorhea. Mouth/Throat: Mucous membranes are moist.  Oropharynx non-erythematous. Neck: No stridor.  No meningeal signs.   Cardiovascular: Normal rate, regular rhythm. Good peripheral circulation. Grossly normal heart sounds.   Respiratory: Normal respiratory effort.  No retractions. Lungs CTAB. Gastrointestinal: Soft and ttp RUQ/RLQ. Midline scar. No distention.  Musculoskeletal: No lower extremity tenderness nor edema. No gross deformities of extremities. Neurologic:  Normal speech and language. No gross focal neurologic deficits are appreciated.  Skin:  Skin is warm, dry and intact. No rash noted.   ____________________________________________   LABS (all labs ordered are listed, but only abnormal results are displayed)  Labs Reviewed  CBC  WITH DIFFERENTIAL/PLATELET - Abnormal; Notable for the following components:      Result Value   Hemoglobin 12.6 (*)    HCT 37.8 (*)    All other components within normal limits  COMPREHENSIVE METABOLIC PANEL - Abnormal; Notable for the following components:   Potassium 3.4 (*)    CO2 20 (*)    Glucose, Bld 117 (*)    Creatinine, Ser 2.05 (*)    Albumin 2.8 (*)    AST 12 (*)    GFR calc non Af Amer 33 (*)    GFR calc Af Amer 38 (*)    All other components within normal limits  LIPASE, BLOOD   ____________________________________________  EKG   EKG Interpretation  Date/Time:  Friday July 14 2018 23:53:55 EDT Ventricular Rate:  83 PR Interval:    QRS Duration: 114 QT Interval:  406 QTC Calculation: 478 R Axis:   23 Text Interpretation:  Atrial fibrillation Borderline intraventricular conduction delay Abnormal R-wave progression, early transition Borderline prolonged QT interval Confirmed by Merrily Pew [phone]) on 07/15/2018 12:21:10 AM       ____________________________________________  RADIOLOGY  Ct Abdomen Pelvis Wo Contrast  Result Date: 07/15/2018 CLINICAL DATA:  Acute generalized abdominal pain for 3 days. Diarrhea. History of abdominal aortic aneurysm. EXAM: CT ABDOMEN AND PELVIS WITHOUT CONTRAST TECHNIQUE: Multidetector CT imaging of the abdomen and pelvis was performed following the standard protocol without IV contrast. COMPARISON:  09/15/2015 FINDINGS: Lower chest: Small left pleural effusion. Mild basilar atelectasis. Small esophageal hiatal hernia. Hepatobiliary: Cholelithiasis. No gallbladder wall thickening or infiltration. No focal liver lesions. No bile duct dilatation. Pancreas: Unremarkable. No pancreatic ductal dilatation or surrounding inflammatory changes. Spleen: Normal in size without focal abnormality. Adrenals/Urinary Tract: 14 mm left adrenal gland nodule, unchanged since prior study. Atrophic right kidney. Stone in the upper pole left kidney  measuring 3 mm diameter. No hydronephrosis or hydroureter. Bladder is unremarkable. Stomach/Bowel: Stomach, small bowel, and colon are contrast filled suggesting no evidence of obstruction. Mild dilatation of the cecum may indicate ileus. No wall thickening or inflammatory changes. Appendix is surgically absent. Vascular/Lymphatic: Eccentric saccular abdominal aortic aneurysm arising from the upper abdominal aorta and measuring about 4 cm diameter. No change in appearance since previous study. There is prominent calcification of the aorta and branch vessels. No significant lymphadenopathy. Reproductive: Prostate is unremarkable.  Other: No free air or free fluid in the abdomen. Abdominal wall musculature appears intact. Musculoskeletal: Degenerative changes in the spine. Mild inferior endplate compression at L5 may represent developing Schmorl's node. This is new since previous study. IMPRESSION: 1. Small left pleural effusion with basilar atelectasis. 2. Cholelithiasis without evidence of cholecystitis. 3. Nonobstructing stone in the upper pole left kidney. Atrophic right kidney. 4. Saccular abdominal aortic aneurysm off of the left upper abdominal aorta measuring 4 cm diameter and without change since previous study. 5. Mild dilatation of the cecum may indicate ileus. No evidence of bowel obstruction. Electronically Signed   By: Lucienne Capers M.D.   On: 07/15/2018 03:26   Dg Chest 2 View  Result Date: 07/15/2018 CLINICAL DATA:  Lower abdominal pain for 3 days. Diarrhea. EXAM: CHEST - 2 VIEW COMPARISON:  07/25/2015 FINDINGS: Heart size and pulmonary vascularity are normal. Emphysematous changes in the lungs. Central interstitial pattern consistent with chronic bronchitis. Blunting of the left costophrenic angle suggesting fluid or thickened pleura. No airspace disease or consolidation in the lungs. No pneumothorax. Mediastinal contours appear intact. IMPRESSION: Emphysematous and chronic bronchitic changes in  the lungs. Fluid or thickened pleura in the left costophrenic angle. No focal consolidation. Electronically Signed   By: Lucienne Capers M.D.   On: 07/15/2018 01:06    ____________________________________________   PROCEDURES  Procedure(s) performed:   Procedures   ____________________________________________   INITIAL IMPRESSION / ASSESSMENT AND PLAN / ED COURSE  Eval gallbladder versus appendicitis versus colitis.  Will start with treatment for possible gastritis as well and continue treatment if that does not work.  Work-up as above.  Unclear etiology for symptoms at this time suspect possible Barrett biliary colic versus gastritis.  Will treat symptomatically and come back for an ultrasound tomorrow will return here if worsening symptoms otherwise follow-up with surgery/primary care doctor.     Pertinent labs & imaging results that were available during my care of the patient were reviewed by me and considered in my medical decision making (see chart for details).  ____________________________________________  FINAL CLINICAL IMPRESSION(S) / ED DIAGNOSES  Final diagnoses:  Right upper quadrant abdominal pain     MEDICATIONS GIVEN DURING THIS VISIT:  Medications  alum & mag hydroxide-simeth (MAALOX/MYLANTA) 200-200-20 MG/5ML suspension 30 mL (30 mLs Oral Given 07/15/18 0020)    And  lidocaine (XYLOCAINE) 2 % viscous mouth solution 15 mL (15 mLs Oral Given 07/15/18 0019)  fentaNYL (SUBLIMAZE) injection 50 mcg ( Intravenous Not Given 07/15/18 0134)  oxyCODONE-acetaminophen (PERCOCET/ROXICET) 5-325 MG per tablet 1 tablet (1 tablet Oral Given 07/15/18 0409)  ondansetron (ZOFRAN) injection 4 mg (4 mg Intravenous Given 07/15/18 0409)     NEW OUTPATIENT MEDICATIONS STARTED DURING THIS VISIT:  New Prescriptions   ONDANSETRON (ZOFRAN) 4 MG TABLET    Take 1 tablet (4 mg total) by mouth every 8 (eight) hours as needed for nausea or vomiting.   OXYCODONE-ACETAMINOPHEN  (PERCOCET/ROXICET) 5-325 MG TABLET    Take 1 tablet by mouth every 4 (four) hours as needed for severe pain.    Note:  This note was prepared with assistance of Dragon voice recognition software. Occasional wrong-word or sound-a-like substitutions may have occurred due to the inherent limitations of voice recognition software.   Maryclare Nydam, Corene Cornea, MD 07/15/18 [phone]

## 2018-07-17 ENCOUNTER — Other Ambulatory Visit: Payer: Self-pay

## 2018-07-17 ENCOUNTER — Ambulatory Visit (HOSPITAL_COMMUNITY)
Admission: RE | Admit: 2018-07-17 | Discharge: 2018-07-17 | Disposition: A | Discharge status: Home / Self Care | Payer: Medicare HMO | Source: Ambulatory Visit | Attending: Emergency Medicine | Admitting: Emergency Medicine

## 2018-07-17 DIAGNOSIS — K8020 Calculus of gallbladder without cholecystitis without obstruction: Secondary | ICD-10-CM | POA: Insufficient documentation

## 2018-07-17 DIAGNOSIS — R1110 Vomiting, unspecified: Secondary | ICD-10-CM | POA: Insufficient documentation

## 2018-07-17 DIAGNOSIS — R1011 Right upper quadrant pain: Secondary | ICD-10-CM | POA: Diagnosis not present

## 2018-07-17 MED FILL — Oxycodone w/ Acetaminophen Tab 5-325 MG: ORAL | Qty: 6 | Status: AC

## 2018-07-17 MED FILL — Ondansetron HCl Tab 4 MG: ORAL | Qty: 4 | Status: AC

## 2018-07-18 ENCOUNTER — Ambulatory Visit (HOSPITAL_COMMUNITY): Payer: Medicare HMO

## 2018-07-21 ENCOUNTER — Other Ambulatory Visit: Payer: Self-pay | Admitting: Cardiology

## 2018-08-16 ENCOUNTER — Other Ambulatory Visit: Payer: Self-pay | Admitting: Family Medicine

## 2018-08-28 ENCOUNTER — Other Ambulatory Visit: Payer: Self-pay

## 2018-08-28 ENCOUNTER — Telehealth: Payer: Self-pay | Admitting: Cardiology

## 2018-08-28 ENCOUNTER — Encounter: Payer: Self-pay | Admitting: Family Medicine

## 2018-08-28 ENCOUNTER — Ambulatory Visit (INDEPENDENT_AMBULATORY_CARE_PROVIDER_SITE_OTHER): Payer: Medicare Other | Admitting: Family Medicine

## 2018-08-28 ENCOUNTER — Telehealth: Payer: Self-pay | Admitting: Family Medicine

## 2018-08-28 DIAGNOSIS — K8020 Calculus of gallbladder without cholecystitis without obstruction: Secondary | ICD-10-CM | POA: Diagnosis not present

## 2018-08-28 DIAGNOSIS — R1011 Right upper quadrant pain: Secondary | ICD-10-CM | POA: Diagnosis not present

## 2018-08-28 MED ORDER — PANTOPRAZOLE SODIUM 40 MG PO TBEC: 40.0000 mg | DELAYED_RELEASE_TABLET | Freq: Every day | ORAL | 2 refills | Status: DC

## 2018-08-28 MED ORDER — OXYCODONE-ACETAMINOPHEN 5-325 MG PO TABS: 1.0000 | ORAL_TABLET | ORAL | 0 refills | Status: DC | PRN

## 2018-08-28 MED ORDER — ONDANSETRON HCL 4 MG PO TABS: 4.0000 mg | ORAL_TABLET | Freq: Three times a day (TID) | ORAL | 0 refills | Status: DC | PRN

## 2018-08-28 NOTE — Telephone Encounter (Signed)
Pt has phone visit today

## 2018-08-28 NOTE — Telephone Encounter (Signed)
Will forward to Dr. Harl Bowie to advise if pt is a candidate for coumadin.

## 2018-08-28 NOTE — Telephone Encounter (Signed)
Patient's wife called stating that patient was seen in the ED and was diagnosed with Gallstones. She would like to know if we can refer him to a gastroenterologist as he is taking otc pain medications with no relief. Please advise?

## 2018-08-28 NOTE — Telephone Encounter (Signed)
New Message   Pt c/o medication issue:  1. Name of Medication: Eliquis  2. How are you currently taking this medication (dosage and times per day)? 5mg  twice a day  3. Are you having a reaction (difficulty breathing--STAT)? NO   4. What is your medication issue? Patient states medication too expensive wants to know if there is a cheaper alternative.

## 2018-08-28 NOTE — Progress Notes (Signed)
Virtual Visit via Telephone Note  I connected with [name] [name] on 08/28/18 at 3pm  by telephone and verified that I am speaking with the correct person using two identifiers.   Pt location: at home   Physician location: in office, [place] , Vic Blackbird MD  On phone call-patient, his wife and physician I discussed the limitations, risks, security and privacy concerns of performing an evaluation and management service by telephone and the availability of in person appointments. I also discussed with the patient that there may be a patient responsible charge related to this service. The patient expressed understanding and agreed to proceed.   Called LVM for pt 2:08pm  Called LVM for pt 2:35pm- called both numbers  History of Present Illness: Patient called and he was seen in the emergency room 6 weeks ago secondary to right upper quadrant abdominal pain and vomiting.  CT scan showed gallstones ultrasound was done which showed calcified gallstones.  He also has known atrial fibrillation ,atherosclerotic disease, dominant aortic aneurysm which is stable and chronic kidney disease.  He is on multiple medications.  He states when he eats the wrong things such as fatty foods heavy meals pizza beings he will get pain in the right upper quadrant along with nausea.  Recently he is just been eating fruit boiled eggs drinking mostly water he has been able to tolerate this.  He denies any recent vomiting.  He does not have any pain currently because of the bland diet he has been following the past couple days.  He is out of his pain medicine and the nausea medicine.  He has not had any recent fever.   Observations/Objective: Unable to visualized, no acute distress over the phone  Assessment and Plan: RUQ pain/Gallstones-documented gallstones with right upper quadrant pain associated.  Fatty foods and heavy meals are triggering his symptoms.  Agree to continue with light bland diet.  I  will refill the Zofran as well as the Percocet.  We will refer him to general surgery I think that he needs his gallbladder removed.  He is high risk for surgery based on his cardiac history and his chronic kidney disease with a creatinine of 2.0 at his baseline.  Reviewed emergency room note as well as his labs and imaging  Follow Up Instructions:    I discussed the assessment and treatment plan with the patient. The patient was provided an opportunity to ask questions and all were answered. The patient agreed with the plan and demonstrated an understanding of the instructions.   The patient was advised to call back or seek an in-person evaluation if the symptoms worsen or if the condition fails to improve as anticipated.  I provided 15 minutes of non-face-to-face time during this encounter. End time 3:11pm  Vic Blackbird, MD

## 2018-08-28 NOTE — Telephone Encounter (Signed)
Pt can't afford his Eliquis, wants to know if there is anything cheaper he can take?

## 2018-08-29 ENCOUNTER — Other Ambulatory Visit: Payer: Self-pay | Admitting: *Deleted

## 2018-08-29 MED ORDER — WARFARIN SODIUM 5 MG PO TABS: ORAL_TABLET | ORAL | 1 refills | Status: DC

## 2018-08-29 NOTE — Telephone Encounter (Signed)
Please refer him to coumadin clinic. Does he still have some eliquis to last at home until the appt?   Zandra Abts MD

## 2018-08-29 NOTE — Telephone Encounter (Signed)
Just spoke with pt. He is fine with starting coumadin. He will tae his last eliquis tonight. So he will need to get in to see Edrick Oh, RN asap. I will forward this to Edrick Oh to advise when an appt is available.

## 2018-08-29 NOTE — Progress Notes (Signed)
Spoke with pt and wife.  Pt will take last dose of eliquis tonight. (all he has) and start on Warfarin 7.5mg  daily except 5mg  on M,W,F tomorrow 08/30/18.  New Rx for Warfarin 5mg  tablets sent to [facility].  Pt has been on warfarin before and understands guidelines. Follow up in coumadin clinic in 1 week.  Appt made.

## 2018-09-05 ENCOUNTER — Ambulatory Visit (INDEPENDENT_AMBULATORY_CARE_PROVIDER_SITE_OTHER): Payer: Medicare Other | Admitting: General Surgery

## 2018-09-05 ENCOUNTER — Other Ambulatory Visit: Payer: Self-pay

## 2018-09-05 ENCOUNTER — Encounter: Payer: Self-pay | Admitting: General Surgery

## 2018-09-05 VITALS — BP 165/78 | HR 68 | Temp 98.0°F | Resp 18 | Wt 183.0 lb

## 2018-09-05 DIAGNOSIS — I2510 Atherosclerotic heart disease of native coronary artery without angina pectoris: Secondary | ICD-10-CM

## 2018-09-05 DIAGNOSIS — K8050 Calculus of bile duct without cholangitis or cholecystitis without obstruction: Secondary | ICD-10-CM

## 2018-09-05 NOTE — Progress Notes (Signed)
[name] [name]; [id number]; [birth year]   HPI Patient is a 68 year old white male who was referred to my care by Dr. Buelah Manis for evaluation treatment of biliary colic secondary to cholelithiasis.  Patient states he has been seen in the emergency room for right upper quadrant abdominal pain which radiated around to his right flank after eating in March 2020.  He was diagnosed with gallstones at that time.  A follow-up ultrasound was performed the next day which revealed cholelithiasis with a normal common bile duct.  He did not have pericholecystic fluid at that time.  Since that time, he has had intermittent episodes of biliary colic which seem to be increasing in intensity and frequency.  He denies any fever, chills, or jaundice.  He does have a pain level of 8 out of 10 when the attacks occur.  He is on Coumadin for atrial fibrillation.  He last saw cardiology in January 2020. Past Medical History:  Diagnosis Date  . AAA (abdominal aortic aneurysm) ([place])    a. s/p repair 06/2015 with left renal artery bypass at that time, post-op course c/b renal failure requiring dialysis, C dif.  . Anemia   . Arteriosclerotic cardiovascular disease (ASCVD)    a. AMI in 2000 treated at [facility]. b. cath in 12/2006->  Chronic total obstruction of the RCA;  drug-eluting stent placed in OM1, LVEF abnormal.  . Cerebrovascular disease 2002   carotid stent  . Chronic anticoagulation   . Chronic combined systolic and diastolic CHF (congestive heart failure) ([place])   . CKD (chronic kidney disease), stage IV ([place])   . GERD (gastroesophageal reflux disease)   . Hyperlipidemia   . Hypertension   . Myocardial infarction ([place]) 10 yrs ago  . Nephrolithiasis   . Permanent atrial fibrillation   . PVD (peripheral vascular disease) ([place])    Ct angiogram in 2009 revealed stable disease with 80% celiac stenosis,50% right renal artery ,ASCVD with ulceration in the abdominal aortashe  . Testicular carcinoma ([place]) 1990   right  orchiectomy  . Tobacco abuse, in remission    20 pack years; quit in 2009    Past Surgical History:  Procedure Laterality Date  . ABDOMINAL AORTIC ANEURYSM REPAIR N/A 06/09/2015   Procedure: ANEURYSM ABDOMINAL AORTIC REPAIR;  Surgeon: Elam Dutch, MD;  Location[facility];  Service: Vascular;  Laterality: N/A;  . AORTIC ENDARTERECETOMY N/A 06/09/2015   Procedure: AORTIC ENDARTERECETOMY;  Surgeon: Elam Dutch, MD;  Location: [place];  Service: Vascular;  Laterality: N/A;  . AORTIC/RENAL BYPASS Left 06/09/2015   Procedure: LEFT RENAL Artery BYPASS;  Surgeon: Elam Dutch, MD;  Location: [place];  Service: Vascular;  Laterality: Left;  . APPENDECTOMY  2004  . COLONOSCOPY  06/17/2011   INCOMPLETE, PREP POOR. Procedure: COLONOSCOPY;  Surgeon: Daneil Dolin, MD;  [facility];  Service: Endoscopy;  Laterality: N/A;  10:00  . COLONOSCOPY  07/15/2011   [id number]:[id number] rectal and colon polyps  . COLONOSCOPY N/A 05/22/2015   [id number] colonic and rectal polyps. tubular adenomas.repeat TCS 05/2018  . ESOPHAGEAL DILATION N/A 05/22/2015   Procedure: ESOPHAGEAL DILATION;  Surgeon: Daneil Dolin, MD;  [facility];  Service: Endoscopy;  Laterality: N/A;  . ESOPHAGOGASTRODUODENOSCOPY  06/17/2011   severe erosive/ulcerative reflux esophagitis, soft noncritical stricture dilatied, small hh, antral erosion   . ESOPHAGOGASTRODUODENOSCOPY N/A 05/22/2015   RMR: 2 cm HH otherwise normal. s/p empirical dilation.  . INSERTION OF DIALYSIS CATHETER Left 06/26/2015   Procedure: INSERTION OF  DIALYSIS CATHETER;  Surgeon: Angelia Mould, MD;  Location: [place] [place];  Service: Vascular;  Laterality: Left;  . PERIPHERAL VASCULAR CATHETERIZATION N/A 05/28/2015   Procedure: Abdominal Aortogram;  Surgeon: Serafina Mitchell, MD;  Location: [place] [facility] LAB;  Service: Cardiovascular;  Laterality: N/A;  . testicular cancer  1990   right orchiectomy    Family History  Problem Relation Age of Onset  .  Colon cancer Father 32       deceased  . Prostate cancer Father   . Heart disease Mother   . Liver disease Neg Hx   . Anesthesia problems Neg Hx   . Hypotension Neg Hx   . Malignant hyperthermia Neg Hx   . Pseudochol deficiency Neg Hx     Current Outpatient Medications on File Prior to Visit  Medication Sig Dispense Refill  . albuterol (PROVENTIL HFA;VENTOLIN HFA) 108 (90 BASE) MCG/ACT inhaler Inhale 2-4 puffs into the lungs every 4 (four) hours as needed for wheezing or shortness of breath. 1 Inhaler 0  . amLODipine (NORVASC) 10 MG tablet Take 1 tablet by mouth once daily 90 tablet 0  . Ferrous Sulfate (IRON) 325 (65 Fe) MG TABS TAKE 1 TABLET BY MOUTH THREE TIMES DAILY WITH MEALS 270 each 0  . furosemide (LASIX) 40 MG tablet Take 1 tablet (40 mg total) by mouth every other day. Take 1 tablet by mouth once daily 45 tablet 3  . lisinopril (PRINIVIL,ZESTRIL) 40 MG tablet Take 1 tablet (40 mg total) by mouth daily. 90 tablet 0  . metoprolol succinate (TOPROL-XL) 100 MG 24 hr tablet TAKE 1 & 1/2 (ONE & ONE-HALF) TABLETS BY MOUTH ONCE DAILY 135 tablet 3  . Multiple Vitamin (ONE-A-DAY MENS PO) Take 1 tablet by mouth daily.    . nitroGLYCERIN (NITROSTAT) 0.4 MG SL tablet place 1 tablet under the tongue if needed every 5 minutes for chest pain for 3 doses IF NO RELIEF AFTER 3RD DOSE CALL PRESCRIBER OR 911. 25 tablet 2  . ondansetron (ZOFRAN) 4 MG tablet Take 1 tablet (4 mg total) by mouth every 8 (eight) hours as needed for nausea or vomiting. 20 tablet 0  . oxyCODONE-acetaminophen (PERCOCET/ROXICET) 5-325 MG tablet Take 1 tablet by mouth every 4 (four) hours as needed for severe pain. 30 tablet 0  . pantoprazole (PROTONIX) 40 MG tablet Take 1 tablet (40 mg total) by mouth daily. 90 tablet 2  . sodium bicarbonate 650 MG tablet Take 1 tablet (650 mg total) by mouth 3 (three) times daily. 270 tablet 1  . tamsulosin (FLOMAX) 0.4 MG CAPS capsule TAKE 1 CAPSULE BY MOUTH ONCE DAILY AFTER SUPPER 90 capsule  0  . warfarin (COUMADIN) 5 MG tablet Take 1 1/2 tablets daily except 1 tablet on Mondays, Wednesdays and Fridays 120 tablet 1  . atorvastatin (LIPITOR) 40 MG tablet Take 1 tablet (40 mg total) by mouth daily. 90 tablet 3  . fexofenadine (ALLEGRA) 180 MG tablet Take 1 tablet (180 mg total) by mouth daily. (Patient not taking: Reported on 09/05/2018) 30 tablet 2  . hydrALAZINE (APRESOLINE) 25 MG tablet Take 1.5 tablets (37.5 mg total) by mouth 3 (three) times daily. 405 tablet 3  . Omega-3 Fatty Acids (FISH OIL) 1000 MG CAPS Take 1,000 mg by mouth daily.   0  . traZODone (DESYREL) 50 MG tablet Take 1 tablet (50 mg total) by mouth at bedtime as needed. for sleep. Requires office visit before any further refills can be given. (Patient not taking: Reported on  09/05/2018) 30 tablet 0   No current facility-administered medications on file prior to visit.     Not on File  Social History   Substance and Sexual Activity  Alcohol Use No  . Alcohol/week: 0.0 standard drinks    Social History   Tobacco Use  Smoking Status Current Some Day Smoker  . Packs/day: 0.10  . Years: 40.00  . Pack years: 4.00  . Types: Cigarettes  . Last attempt to quit: 05/24/2015  . Years since quitting: 3.2  Smokeless Tobacco Never Used  Tobacco Comment   1/2 pack daily    Review of Systems  Constitutional: Negative.   HENT: Negative.   Eyes: Negative.   Respiratory: Negative.   Cardiovascular: Negative.   Gastrointestinal: Positive for abdominal pain.  Genitourinary: Negative.   Musculoskeletal: Positive for back pain.  Skin: Negative.   Neurological: Negative.   Endo/Heme/Allergies: Bruises/bleeds easily.  Psychiatric/Behavioral: Negative.     Objective   Vitals:   09/05/18 1415  BP: (!) 165/78  Pulse: 68  Resp: 18  Temp: 98 F (36.7 C)  SpO2: 97%    Physical Exam Vitals signs reviewed.  Constitutional:      Appearance: Normal appearance. He is not ill-appearing.  HENT:     Head:  Normocephalic and atraumatic.  Eyes:     General: No scleral icterus. Cardiovascular:     Rate and Rhythm: Normal rate. Rhythm irregular.     Heart sounds: Normal heart sounds. No murmur. No gallop.   Pulmonary:     Effort: Pulmonary effort is normal. No respiratory distress.     Breath sounds: Normal breath sounds. No stridor. No wheezing, rhonchi or rales.  Abdominal:     General: Abdomen is flat. Bowel sounds are normal. There is no distension.     Palpations: Abdomen is soft. There is no mass.     Tenderness: There is no abdominal tenderness. There is no guarding or rebound.     Hernia: No hernia is present.     Comments: Large midline surgical scar present.  Skin:    General: Skin is warm and dry.  Neurological:     Mental Status: He is alert and oriented to person, place, and time.   CT scan and ultrasound reports reviewed Primary care notes reviewed  ER note reviewed Cardiology note reviewed  Assessment  Symptomatic cholelithiasis, chronic anticoagulation due to atrial fibrillation, renal insufficiency Plan   Patient is scheduled for laparoscopic cholecystectomy, possible open on 09/18/2018.  The risks and benefits of the procedure including bleeding, infection, cardiopulmonary difficulties, bowel injury, hepatobiliary injury, and the possibility of an open procedure due to his previous abdominal aortic aneurysm repair were fully explained to the patient, who gave informed consent.  He was instructed to stop his Coumadin 3 days prior to the procedure.

## 2018-09-05 NOTE — Patient Instructions (Signed)
Stop coumadinthree days before procedure 09/15/18   Laparoscopic Cholecystectomy Laparoscopic cholecystectomy is surgery to remove the gallbladder. The gallbladder is a pear-shaped organ that lies beneath the liver on the right side of the body. The gallbladder stores bile, which is a fluid that helps the body to digest fats. Cholecystectomy is often done for inflammation of the gallbladder (cholecystitis). This condition is usually caused by a buildup of gallstones (cholelithiasis) in the gallbladder. Gallstones can block the flow of bile, which can result in inflammation and pain. In severe cases, emergency surgery may be required. This procedure is done though small incisions in your abdomen (laparoscopic surgery). A thin scope with a camera (laparoscope) is inserted through one incision. Thin surgical instruments are inserted through the other incisions. In some cases, a laparoscopic procedure may be turned into a type of surgery that is done through a larger incision (open surgery). Tell a health care provider about:  Any allergies you have.  All medicines you are taking, including vitamins, herbs, eye drops, creams, and over-the-counter medicines.  Any problems you or family members have had with anesthetic medicines.  Any blood disorders you have.  Any surgeries you have had.  Any medical conditions you have.  Whether you are pregnant or may be pregnant. What are the risks? Generally, this is a safe procedure. However, problems may occur, including:  Infection.  Bleeding.  Allergic reactions to medicines.  Damage to other structures or organs.  A stone remaining in the common bile duct. The common bile duct carries bile from the gallbladder into the small intestine.  A bile leak from the cyst duct that is clipped when your gallbladder is removed. What happens before the procedure? Medicines  Ask your health care provider about: ? Changing or stopping your regular  medicines. This is especially important if you are taking diabetes medicines or blood thinners. ? Taking medicines such as aspirin and ibuprofen. These medicines can thin your blood. Do not take these medicines before your procedure if your health care provider instructs you not to.  You may be given antibiotic medicine to help prevent infection. General instructions  Let your health care provider know if you develop a cold or an infection before surgery.  Plan to have someone take you home from the hospital or clinic.  Ask your health care provider how your surgical site will be marked or identified. What happens during the procedure?   To reduce your risk of infection: ? Your health care team will wash or sanitize their hands. ? Your skin will be washed with soap. ? Hair may be removed from the surgical area.  An IV tube may be inserted into one of your veins.  You will be given one or more of the following: ? A medicine to help you relax (sedative). ? A medicine to make you fall asleep (general anesthetic).  A breathing tube will be placed in your mouth.  Your surgeon will make several small cuts (incisions) in your abdomen.  The laparoscope will be inserted through one of the small incisions. The camera on the laparoscope will send images to a TV screen (monitor) in the operating room. This lets your surgeon see inside your abdomen.  Air-like gas will be pumped into your abdomen. This will expand your abdomen to give the surgeon more room to perform the surgery.  Other tools that are needed for the procedure will be inserted through the other incisions. The gallbladder will be removed through one of the  incisions.  Your common bile duct may be examined. If stones are found in the common bile duct, they may be removed.  After your gallbladder has been removed, the incisions will be closed with stitches (sutures), staples, or skin glue.  Your incisions may be covered with a  bandage (dressing). The procedure may vary among health care providers and hospitals. What happens after the procedure?  Your blood pressure, heart rate, breathing rate, and blood oxygen level will be monitored until the medicines you were given have worn off.  You will be given medicines as needed to control your pain.  Do not drive for 24 hours if you were given a sedative. This information is not intended to replace advice given to you by your health care provider. Make sure you discuss any questions you have with your health care provider. Document Released: 04/19/2005 Document Revised: 03/17/2017 Document Reviewed: 10/06/2015 Elsevier Interactive Patient Education  2019 [employer].

## 2018-09-05 NOTE — H&P (Signed)
[name] [name]; [id number]; [birth year]   HPI Patient is a 68 year old white male who was referred to my care by Dr. Buelah Manis for evaluation treatment of biliary colic secondary to cholelithiasis.  Patient states he has been seen in the emergency room for right upper quadrant abdominal pain which radiated around to his right flank after eating in March 2020.  He was diagnosed with gallstones at that time.  A follow-up ultrasound was performed the next day which revealed cholelithiasis with a normal common bile duct.  He did not have pericholecystic fluid at that time.  Since that time, he has had intermittent episodes of biliary colic which seem to be increasing in intensity and frequency.  He denies any fever, chills, or jaundice.  He does have a pain level of 8 out of 10 when the attacks occur.  He is on Coumadin for atrial fibrillation.  He last saw cardiology in January 2020. Past Medical History:  Diagnosis Date  . AAA (abdominal aortic aneurysm) ([place])    a. s/p repair 06/2015 with left renal artery bypass at that time, post-op course c/b renal failure requiring dialysis, C dif.  . Anemia   . Arteriosclerotic cardiovascular disease (ASCVD)    a. AMI in 2000 treated at [facility]. b. cath in 12/2006->  Chronic total obstruction of the RCA;  drug-eluting stent placed in OM1, LVEF abnormal.  . Cerebrovascular disease 2002   carotid stent  . Chronic anticoagulation   . Chronic combined systolic and diastolic CHF (congestive heart failure) ([place])   . CKD (chronic kidney disease), stage IV ([place])   . GERD (gastroesophageal reflux disease)   . Hyperlipidemia   . Hypertension   . Myocardial infarction ([place]) 10 yrs ago  . Nephrolithiasis   . Permanent atrial fibrillation   . PVD (peripheral vascular disease) ([place])    Ct angiogram in 2009 revealed stable disease with 80% celiac stenosis,50% right renal artery ,ASCVD with ulceration in the abdominal aortashe  . Testicular carcinoma ([place]) 1990   right  orchiectomy  . Tobacco abuse, in remission    20 pack years; quit in 2009    Past Surgical History:  Procedure Laterality Date  . ABDOMINAL AORTIC ANEURYSM REPAIR N/A 06/09/2015   Procedure: ANEURYSM ABDOMINAL AORTIC REPAIR;  Surgeon: Elam Dutch, MD;  Location: [place] OR;  Service: Vascular;  Laterality: N/A;  . AORTIC ENDARTERECETOMY N/A 06/09/2015   Procedure: AORTIC ENDARTERECETOMY;  Surgeon: Elam Dutch, MD;  Location: [place];  Service: Vascular;  Laterality: N/A;  . AORTIC/RENAL BYPASS Left 06/09/2015   Procedure: LEFT RENAL Artery BYPASS;  Surgeon: Elam Dutch, MD;  Location: [place];  Service: Vascular;  Laterality: Left;  . APPENDECTOMY  2004  . COLONOSCOPY  06/17/2011   INCOMPLETE, PREP POOR. Procedure: COLONOSCOPY;  Surgeon: Daneil Dolin, MD;  [facility];  Service: Endoscopy;  Laterality: N/A;  10:00  . COLONOSCOPY  07/15/2011   MGQ:[id number] rectal and colon polyps  . COLONOSCOPY N/A 05/22/2015   TOI:[id number] colonic and rectal polyps. tubular adenomas.repeat TCS 05/2018  . ESOPHAGEAL DILATION N/A 05/22/2015   Procedure: ESOPHAGEAL DILATION;  Surgeon: Daneil Dolin, MD;  [facility];  Service: Endoscopy;  Laterality: N/A;  . ESOPHAGOGASTRODUODENOSCOPY  06/17/2011   severe erosive/ulcerative reflux esophagitis, soft noncritical stricture dilatied, small hh, antral erosion   . ESOPHAGOGASTRODUODENOSCOPY N/A 05/22/2015   RMR: 2 cm HH otherwise normal. s/p empirical dilation.  . INSERTION OF DIALYSIS CATHETER Left 06/26/2015   Procedure: INSERTION OF  DIALYSIS CATHETER;  Surgeon: Angelia Mould, MD;  Location: [place];  Service: Vascular;  Laterality: Left;  . PERIPHERAL VASCULAR CATHETERIZATION N/A 05/28/2015   Procedure: Abdominal Aortogram;  Surgeon: Serafina Mitchell, MD;  Location: [place] [facility];  Service: Cardiovascular;  Laterality: N/A;  . testicular cancer  1990   right orchiectomy    Family History  Problem Relation Age of Onset  .  Colon cancer Father 41       deceased  . Prostate cancer Father   . Heart disease Mother   . Liver disease Neg Hx   . Anesthesia problems Neg Hx   . Hypotension Neg Hx   . Malignant hyperthermia Neg Hx   . Pseudochol deficiency Neg Hx     Current Outpatient Medications on File Prior to Visit  Medication Sig Dispense Refill  . albuterol (PROVENTIL HFA;VENTOLIN HFA) 108 (90 BASE) MCG/ACT inhaler Inhale 2-4 puffs into the lungs every 4 (four) hours as needed for wheezing or shortness of breath. 1 Inhaler 0  . amLODipine (NORVASC) 10 MG tablet Take 1 tablet by mouth once daily 90 tablet 0  . Ferrous Sulfate (IRON) 325 (65 Fe) MG TABS TAKE 1 TABLET BY MOUTH THREE TIMES DAILY WITH MEALS 270 each 0  . furosemide (LASIX) 40 MG tablet Take 1 tablet (40 mg total) by mouth every other day. Take 1 tablet by mouth once daily 45 tablet 3  . lisinopril (PRINIVIL,ZESTRIL) 40 MG tablet Take 1 tablet (40 mg total) by mouth daily. 90 tablet 0  . metoprolol succinate (TOPROL-XL) 100 MG 24 hr tablet TAKE 1 & 1/2 (ONE & ONE-HALF) TABLETS BY MOUTH ONCE DAILY 135 tablet 3  . Multiple Vitamin (ONE-A-DAY MENS PO) Take 1 tablet by mouth daily.    . nitroGLYCERIN (NITROSTAT) 0.4 MG SL tablet place 1 tablet under the tongue if needed every 5 minutes for chest pain for 3 doses IF NO RELIEF AFTER 3RD DOSE CALL PRESCRIBER OR 911. 25 tablet 2  . ondansetron (ZOFRAN) 4 MG tablet Take 1 tablet (4 mg total) by mouth every 8 (eight) hours as needed for nausea or vomiting. 20 tablet 0  . oxyCODONE-acetaminophen (PERCOCET/ROXICET) 5-325 MG tablet Take 1 tablet by mouth every 4 (four) hours as needed for severe pain. 30 tablet 0  . pantoprazole (PROTONIX) 40 MG tablet Take 1 tablet (40 mg total) by mouth daily. 90 tablet 2  . sodium bicarbonate 650 MG tablet Take 1 tablet (650 mg total) by mouth 3 (three) times daily. 270 tablet 1  . tamsulosin (FLOMAX) 0.4 MG CAPS capsule TAKE 1 CAPSULE BY MOUTH ONCE DAILY AFTER SUPPER 90 capsule  0  . warfarin (COUMADIN) 5 MG tablet Take 1 1/2 tablets daily except 1 tablet on Mondays, Wednesdays and Fridays 120 tablet 1  . atorvastatin (LIPITOR) 40 MG tablet Take 1 tablet (40 mg total) by mouth daily. 90 tablet 3  . fexofenadine (ALLEGRA) 180 MG tablet Take 1 tablet (180 mg total) by mouth daily. (Patient not taking: Reported on 09/05/2018) 30 tablet 2  . hydrALAZINE (APRESOLINE) 25 MG tablet Take 1.5 tablets (37.5 mg total) by mouth 3 (three) times daily. 405 tablet 3  . Omega-3 Fatty Acids (FISH OIL) 1000 MG CAPS Take 1,000 mg by mouth daily.   0  . traZODone (DESYREL) 50 MG tablet Take 1 tablet (50 mg total) by mouth at bedtime as needed. for sleep. Requires office visit before any further refills can be given. (Patient not taking: Reported on  09/05/2018) 30 tablet 0   No current facility-administered medications on file prior to visit.     Not on File  Social History   Substance and Sexual Activity  Alcohol Use No  . Alcohol/week: 0.0 standard drinks    Social History   Tobacco Use  Smoking Status Current Some Day Smoker  . Packs/day: 0.10  . Years: 40.00  . Pack years: 4.00  . Types: Cigarettes  . Last attempt to quit: 05/24/2015  . Years since quitting: 3.2  Smokeless Tobacco Never Used  Tobacco Comment   1/2 pack daily    Review of Systems  Constitutional: Negative.   HENT: Negative.   Eyes: Negative.   Respiratory: Negative.   Cardiovascular: Negative.   Gastrointestinal: Positive for abdominal pain.  Genitourinary: Negative.   Musculoskeletal: Positive for back pain.  Skin: Negative.   Neurological: Negative.   Endo/Heme/Allergies: Bruises/bleeds easily.  Psychiatric/Behavioral: Negative.     Objective   Vitals:   09/05/18 1415  BP: (!) 165/78  Pulse: 68  Resp: 18  Temp: 98 F (36.7 C)  SpO2: 97%    Physical Exam Vitals signs reviewed.  Constitutional:      Appearance: Normal appearance. He is not ill-appearing.  HENT:     Head:  Normocephalic and atraumatic.  Eyes:     General: No scleral icterus. Cardiovascular:     Rate and Rhythm: Normal rate. Rhythm irregular.     Heart sounds: Normal heart sounds. No murmur. No gallop.   Pulmonary:     Effort: Pulmonary effort is normal. No respiratory distress.     Breath sounds: Normal breath sounds. No stridor. No wheezing, rhonchi or rales.  Abdominal:     General: Abdomen is flat. Bowel sounds are normal. There is no distension.     Palpations: Abdomen is soft. There is no mass.     Tenderness: There is no abdominal tenderness. There is no guarding or rebound.     Hernia: No hernia is present.     Comments: Large midline surgical scar present.  Skin:    General: Skin is warm and dry.  Neurological:     Mental Status: He is alert and oriented to person, place, and time.   CT scan and ultrasound reports reviewed Primary care notes reviewed  ER note reviewed Cardiology note reviewed  Assessment  Symptomatic cholelithiasis, chronic anticoagulation due to atrial fibrillation, renal insufficiency Plan   Patient is scheduled for laparoscopic cholecystectomy, possible open on 09/18/2018.  The risks and benefits of the procedure including bleeding, infection, cardiopulmonary difficulties, bowel injury, hepatobiliary injury, and the possibility of an open procedure due to his previous abdominal aortic aneurysm repair were fully explained to the patient, who gave informed consent.  He was instructed to stop his Coumadin 3 days prior to the procedure.

## 2018-09-06 ENCOUNTER — Telehealth: Payer: Self-pay | Admitting: *Deleted

## 2018-09-06 NOTE — Telephone Encounter (Signed)
° ° °  COVID-19 Pre-Screening Questions:   Do you currently have a fever? No    Have you recently travelled on a cruise, internationally, or to [place], [place], [place], [place], [place], or [place], [place] [employer]) ? No   Have you been in contact with someone that is currently pending confirmation of Covid19 testing or has been confirmed to have the [place] virus? No   Are you currently experiencing fatigue or cough? no

## 2018-09-07 ENCOUNTER — Other Ambulatory Visit: Payer: Self-pay

## 2018-09-07 ENCOUNTER — Telehealth: Payer: Self-pay | Admitting: *Deleted

## 2018-09-07 ENCOUNTER — Ambulatory Visit (INDEPENDENT_AMBULATORY_CARE_PROVIDER_SITE_OTHER): Payer: Medicare Other | Admitting: *Deleted

## 2018-09-07 DIAGNOSIS — Z5181 Encounter for therapeutic drug level monitoring: Secondary | ICD-10-CM | POA: Diagnosis not present

## 2018-09-07 DIAGNOSIS — I4891 Unspecified atrial fibrillation: Secondary | ICD-10-CM | POA: Diagnosis not present

## 2018-09-07 LAB — POCT INR: INR: 2.8 (ref 2.0–3.0)

## 2018-09-07 NOTE — Patient Instructions (Signed)
Pending Lap Cholecystectomy on 5/18 by Dr Arnoldo Morale Will need to hold coumadin 4 days before procedure Continue coumadin 1 1/2 tablets daily except 1 tablet on Mondays, Wednesdays and Fridays Recheck on 09/14/18

## 2018-09-07 NOTE — Telephone Encounter (Signed)
Noted.  Will give pt instructions at INR appt on 09/14/18.

## 2018-09-07 NOTE — Telephone Encounter (Signed)
-----   Message from Leeroy Bock, [facility] sent at 09/07/2018  1:51 PM EDT ----- Efraim Kaufmann,  We called Mr Haralson and he does not report ever having had a stroke. He should be safe to hold his Coumadin for 4 days prior to procedure without a Lovenox bridge.  Sharlene Dory ----- Message ----- From: Malen Gauze, RN Sent: 09/07/2018  12:54 PM EDT To: Rockne Menghini, RPH-CPP, #  Pt is scheduled for lap chole on 5/18 by Dr Arnoldo Morale.  Wants pt off coumadin 4 days before procedure.  Does pt need Lovenox.  Mention of CVA in DX but I can't find any documentation of CVA. Thanks, Lattie Haw

## 2018-09-08 NOTE — Patient Instructions (Signed)
[name] [name]  09/08/2018     @PREFPERIOPPHARMACY @   Your procedure is scheduled on 09/18/18.  Report to [facility] at [phone] A.M.  Call this number if you have problems the morning of surgery:  [phone]   Remember:  Do not eat or drink after midnight.   Take these medicines the morning of surgery with A SIP OF WATER allegra, norvasc, hydralazine, metoprolol, protonix, flomax. Take pain pill if needed.    Do not wear jewelry, make-up or nail polish.  Do not wear lotions, powders, or perfumes, or deodorant.  Do not shave 48 hours prior to surgery.  Men may shave face and neck.  Do not bring valuables to the hospital.  [facility] is not responsible for any belongings or valuables.  Contacts, dentures or bridgework may not be worn into surgery.  Leave your suitcase in the car.  After surgery it may be brought to your room.  For patients admitted to the hospital, discharge time will be determined by your treatment team.  Patients discharged the day of surgery will not be allowed to drive home.   Name and phone number of your driver:  family Special instructions:  family  Please read over the following fact sheets that you were given. Surgical Site Infection Prevention and Anesthesia Post-op Instructions      PATIENT INSTRUCTIONS POST-ANESTHESIA  IMMEDIATELY FOLLOWING SURGERY:  Do not drive or operate machinery for the first twenty four hours after surgery.  Do not make any important decisions for twenty four hours after surgery or while taking narcotic pain medications or sedatives.  If you develop intractable nausea and vomiting or a severe headache please notify your doctor immediately.  FOLLOW-UP:  Please make an appointment with your surgeon as instructed. You do not need to follow up with anesthesia unless specifically instructed to do so.  WOUND CARE INSTRUCTIONS (if applicable):  Keep a dry clean dressing on the anesthesia/puncture wound site if there is drainage.   Once the wound has quit draining you may leave it open to air.  Generally you should leave the bandage intact for twenty four hours unless there is drainage.  If the epidural site drains for more than 36-48 hours please call the anesthesia department.  QUESTIONS?:  Please feel free to call your physician or the hospital operator if you have any questions, and they will be happy to assist you.       Laparoscopic Cholecystectomy Laparoscopic cholecystectomy is surgery to remove the gallbladder. The gallbladder is a pear-shaped organ that lies beneath the liver on the right side of the body. The gallbladder stores bile, which is a fluid that helps the body to digest fats. Cholecystectomy is often done for inflammation of the gallbladder (cholecystitis). This condition is usually caused by a buildup of gallstones (cholelithiasis) in the gallbladder. Gallstones can block the flow of bile, which can result in inflammation and pain. In severe cases, emergency surgery may be required. This procedure is done though small incisions in your abdomen (laparoscopic surgery). A thin scope with a camera (laparoscope) is inserted through one incision. Thin surgical instruments are inserted through the other incisions. In some cases, a laparoscopic procedure may be turned into a type of surgery that is done through a larger incision (open surgery). Tell a health care provider about:  Any allergies you have.  All medicines you are taking, including vitamins, herbs, eye drops, creams, and over-the-counter medicines.  Any problems you or family members have had  with anesthetic medicines.  Any blood disorders you have.  Any surgeries you have had.  Any medical conditions you have.  Whether you are pregnant or may be pregnant. What are the risks? Generally, this is a safe procedure. However, problems may occur, including:  Infection.  Bleeding.  Allergic reactions to medicines.  Damage to other structures  or organs.  A stone remaining in the common bile duct. The common bile duct carries bile from the gallbladder into the small intestine.  A bile leak from the cyst duct that is clipped when your gallbladder is removed. What happens before the procedure? Staying hydrated Follow instructions from your health care provider about hydration, which may include:  Up to 2 hours before the procedure - you may continue to drink clear liquids, such as water, clear fruit juice, black coffee, and plain tea. Eating and drinking restrictions Follow instructions from your health care provider about eating and drinking, which may include:  8 hours before the procedure - stop eating heavy meals or foods such as meat, fried foods, or fatty foods.  6 hours before the procedure - stop eating light meals or foods, such as toast or cereal.  6 hours before the procedure - stop drinking milk or drinks that contain milk.  2 hours before the procedure - stop drinking clear liquids. Medicines  Ask your health care provider about: ? Changing or stopping your regular medicines. This is especially important if you are taking diabetes medicines or blood thinners. ? Taking medicines such as aspirin and ibuprofen. These medicines can thin your blood. Do not take these medicines before your procedure if your health care provider instructs you not to.  You may be given antibiotic medicine to help prevent infection. General instructions  Let your health care provider know if you develop a cold or an infection before surgery.  Plan to have someone take you home from the hospital or clinic.  Ask your health care provider how your surgical site will be marked or identified. What happens during the procedure?   To reduce your risk of infection: ? Your health care team will wash or sanitize their hands. ? Your skin will be washed with soap. ? Hair may be removed from the surgical area.  An IV tube may be inserted  into one of your veins.  You will be given one or more of the following: ? A medicine to help you relax (sedative). ? A medicine to make you fall asleep (general anesthetic).  A breathing tube will be placed in your mouth.  Your surgeon will make several small cuts (incisions) in your abdomen.  The laparoscope will be inserted through one of the small incisions. The camera on the laparoscope will send images to a TV screen (monitor) in the operating room. This lets your surgeon see inside your abdomen.  Air-like gas will be pumped into your abdomen. This will expand your abdomen to give the surgeon more room to perform the surgery.  Other tools that are needed for the procedure will be inserted through the other incisions. The gallbladder will be removed through one of the incisions.  Your common bile duct may be examined. If stones are found in the common bile duct, they may be removed.  After your gallbladder has been removed, the incisions will be closed with stitches (sutures), staples, or skin glue.  Your incisions may be covered with a bandage (dressing). The procedure may vary among health care providers and hospitals. What happens  after the procedure?  Your blood pressure, heart rate, breathing rate, and blood oxygen level will be monitored until the medicines you were given have worn off.  You will be given medicines as needed to control your pain.  Do not drive for 24 hours if you were given a sedative. This information is not intended to replace advice given to you by your health care provider. Make sure you discuss any questions you have with your health care provider. Document Released: 04/19/2005 Document Revised: 03/17/2017 Document Reviewed: 10/06/2015 Elsevier Interactive Patient Education  2019 [employer].

## 2018-09-12 ENCOUNTER — Telehealth: Payer: Self-pay

## 2018-09-12 ENCOUNTER — Other Ambulatory Visit: Payer: Self-pay

## 2018-09-12 ENCOUNTER — Encounter (HOSPITAL_COMMUNITY)
Admission: RE | Admit: 2018-09-12 | Discharge: 2018-09-12 | Disposition: A | Discharge status: Home / Self Care | Payer: Medicare Other | Source: Ambulatory Visit | Attending: General Surgery | Admitting: General Surgery

## 2018-09-12 ENCOUNTER — Other Ambulatory Visit: Payer: Self-pay | Admitting: *Deleted

## 2018-09-12 ENCOUNTER — Encounter (HOSPITAL_COMMUNITY): Payer: Self-pay

## 2018-09-12 DIAGNOSIS — Z01812 Encounter for preprocedural laboratory examination: Secondary | ICD-10-CM | POA: Diagnosis not present

## 2018-09-12 LAB — COMPREHENSIVE METABOLIC PANEL
ALT: 14 U/L (ref 0–44)
AST: 13 U/L — ABNORMAL LOW (ref 15–41)
Albumin: 3.5 g/dL (ref 3.5–5.0)
Alkaline Phosphatase: 105 U/L (ref 38–126)
Anion gap: 9 (ref 5–15)
BUN: 29 mg/dL — ABNORMAL HIGH (ref 8–23)
CO2: 21 mmol/L — ABNORMAL LOW (ref 22–32)
Calcium: 9.4 mg/dL (ref 8.9–10.3)
Chloride: 108 mmol/L (ref 98–111)
Creatinine, Ser: 2.28 mg/dL — ABNORMAL HIGH (ref 0.61–1.24)
GFR calc Af Amer: 33 mL/min — ABNORMAL LOW (ref >60)
GFR calc non Af Amer: 29 mL/min — ABNORMAL LOW (ref >60)
Glucose, Bld: 155 mg/dL — ABNORMAL HIGH (ref 70–99)
Potassium: 3.4 mmol/L — ABNORMAL LOW (ref 3.5–5.1)
Sodium: 138 mmol/L (ref 135–145)
Total Bilirubin: 0.4 mg/dL (ref 0.3–1.2)
Total Protein: 7.6 g/dL (ref 6.5–8.1)

## 2018-09-12 LAB — CBC WITH DIFFERENTIAL/PLATELET
Abs Immature Granulocytes: 0.04 10*3/uL (ref 0.00–0.07)
Basophils Absolute: 0 10*3/uL (ref 0.0–0.1)
Basophils Relative: 1 %
Eosinophils Absolute: 0.1 10*3/uL (ref 0.0–0.5)
Eosinophils Relative: 2 %
HCT: 39.5 % (ref 39.0–52.0)
Hemoglobin: 12.4 g/dL — ABNORMAL LOW (ref 13.0–17.0)
Immature Granulocytes: 1 %
Lymphocytes Relative: 20 %
Lymphs Abs: 1.2 10*3/uL (ref 0.7–4.0)
MCH: 29 pg (ref 26.0–34.0)
MCHC: 31.4 g/dL (ref 30.0–36.0)
MCV: 92.5 fL (ref 80.0–100.0)
Monocytes Absolute: 0.4 10*3/uL (ref 0.1–1.0)
Monocytes Relative: 6 %
Neutro Abs: 4.2 10*3/uL (ref 1.7–7.7)
Neutrophils Relative %: 70 %
Platelets: 206 10*3/uL (ref 150–400)
RBC: 4.27 MIL/uL (ref 4.22–5.81)
RDW: 15.3 % (ref 11.5–15.5)
WBC: 6.1 10*3/uL (ref 4.0–10.5)
nRBC: 0 % (ref 0.0–0.2)

## 2018-09-12 LAB — PROTIME-INR
INR: 3.2 — ABNORMAL HIGH (ref 0.8–1.2)
Prothrombin Time: 31.9 seconds — ABNORMAL HIGH (ref 11.4–15.2)

## 2018-09-12 MED ORDER — OXYCODONE-ACETAMINOPHEN 5-325 MG PO TABS: 1.0000 | ORAL_TABLET | ORAL | 0 refills | Status: DC | PRN

## 2018-09-12 NOTE — Telephone Encounter (Signed)
Patient's wife Vaughan Basta called, states that patient is out of pain meds and would like to see if he could get more. I let her know that you would be in the office at 1pm and I would call her back.

## 2018-09-12 NOTE — Telephone Encounter (Signed)
Received call from patient spouse, Vaughan Basta.   Reports that patient has gallbladder surgery scheduled on 09/18/2018. States that he requires refill on Oxycodone/APAP.  Ok to refill??  Last office visit/ refill 08/28/2018.

## 2018-09-12 NOTE — Progress Notes (Signed)
   09/12/18 1019  OBSTRUCTIVE SLEEP APNEA  Have you ever been diagnosed with sleep apnea through a sleep study? No  Do you snore loudly (loud enough to be heard through closed doors)?  1  Do you often feel tired, fatigued, or sleepy during the daytime (such as falling asleep during driving or talking to someone)? 0  Has anyone observed you stop breathing during your sleep? 0  Do you have, or are you being treated for high blood pressure? 1  BMI more than 35 kg/m2? 0  Age > 50 (1-yes) 1  Neck circumference greater than:Male 16 inches or larger, Male 17inches or larger? 0  Male Gender (Yes=1) 1  Obstructive Sleep Apnea Score 4  Score 5 or greater  Results sent to PCP

## 2018-09-14 ENCOUNTER — Telehealth: Payer: Self-pay

## 2018-09-14 ENCOUNTER — Other Ambulatory Visit: Payer: Self-pay

## 2018-09-14 ENCOUNTER — Other Ambulatory Visit (HOSPITAL_COMMUNITY)
Admission: RE | Admit: 2018-09-14 | Discharge: 2018-09-14 | Disposition: A | Discharge status: Home / Self Care | Payer: Medicare Other | Source: Ambulatory Visit | Attending: General Surgery | Admitting: General Surgery

## 2018-09-14 ENCOUNTER — Ambulatory Visit (INDEPENDENT_AMBULATORY_CARE_PROVIDER_SITE_OTHER): Payer: Medicare Other | Admitting: *Deleted

## 2018-09-14 DIAGNOSIS — I2510 Atherosclerotic heart disease of native coronary artery without angina pectoris: Secondary | ICD-10-CM | POA: Diagnosis not present

## 2018-09-14 DIAGNOSIS — Z8673 Personal history of transient ischemic attack (TIA), and cerebral infarction without residual deficits: Secondary | ICD-10-CM | POA: Diagnosis not present

## 2018-09-14 DIAGNOSIS — I252 Old myocardial infarction: Secondary | ICD-10-CM | POA: Diagnosis not present

## 2018-09-14 DIAGNOSIS — Z955 Presence of coronary angioplasty implant and graft: Secondary | ICD-10-CM | POA: Diagnosis not present

## 2018-09-14 DIAGNOSIS — I4891 Unspecified atrial fibrillation: Secondary | ICD-10-CM

## 2018-09-14 DIAGNOSIS — I4821 Permanent atrial fibrillation: Secondary | ICD-10-CM | POA: Diagnosis not present

## 2018-09-14 DIAGNOSIS — I5042 Chronic combined systolic (congestive) and diastolic (congestive) heart failure: Secondary | ICD-10-CM | POA: Diagnosis not present

## 2018-09-14 DIAGNOSIS — Z8547 Personal history of malignant neoplasm of testis: Secondary | ICD-10-CM | POA: Diagnosis not present

## 2018-09-14 DIAGNOSIS — K219 Gastro-esophageal reflux disease without esophagitis: Secondary | ICD-10-CM | POA: Diagnosis not present

## 2018-09-14 DIAGNOSIS — I130 Hypertensive heart and chronic kidney disease with heart failure and stage 1 through stage 4 chronic kidney disease, or unspecified chronic kidney disease: Secondary | ICD-10-CM | POA: Diagnosis not present

## 2018-09-14 DIAGNOSIS — E785 Hyperlipidemia, unspecified: Secondary | ICD-10-CM | POA: Diagnosis not present

## 2018-09-14 DIAGNOSIS — Z5181 Encounter for therapeutic drug level monitoring: Secondary | ICD-10-CM

## 2018-09-14 DIAGNOSIS — D649 Anemia, unspecified: Secondary | ICD-10-CM | POA: Diagnosis not present

## 2018-09-14 DIAGNOSIS — F17210 Nicotine dependence, cigarettes, uncomplicated: Secondary | ICD-10-CM | POA: Diagnosis not present

## 2018-09-14 DIAGNOSIS — Z1159 Encounter for screening for other viral diseases: Secondary | ICD-10-CM | POA: Diagnosis not present

## 2018-09-14 DIAGNOSIS — N184 Chronic kidney disease, stage 4 (severe): Secondary | ICD-10-CM | POA: Diagnosis not present

## 2018-09-14 DIAGNOSIS — K8010 Calculus of gallbladder with chronic cholecystitis without obstruction: Secondary | ICD-10-CM | POA: Diagnosis not present

## 2018-09-14 DIAGNOSIS — I739 Peripheral vascular disease, unspecified: Secondary | ICD-10-CM | POA: Diagnosis not present

## 2018-09-14 DIAGNOSIS — Z79899 Other long term (current) drug therapy: Secondary | ICD-10-CM | POA: Diagnosis not present

## 2018-09-14 DIAGNOSIS — Z7901 Long term (current) use of anticoagulants: Secondary | ICD-10-CM | POA: Diagnosis not present

## 2018-09-14 LAB — POCT INR: INR: 3.9 — AB (ref 2.0–3.0)

## 2018-09-14 NOTE — Patient Instructions (Signed)
Pending Lap Cholecystectomy on 5/18 by Dr Arnoldo Morale Will need to hold coumadin 4 days before procedure.  Took last dose of coumadin last night.  Restart coumadin day after surgery if OK with surgeon.  Decrease dose to 1 tablet daily except 1 1/2 tablets on Tuesdays and Fridays Recheck on 10/02/18

## 2018-09-14 NOTE — Telephone Encounter (Signed)
Patient called and stated that he had questions regarding Covid testing, patient was given the number to [facility]

## 2018-09-15 LAB — TYPE AND SCREEN
ABO/RH(D): B NEG
Antibody Screen: NEGATIVE

## 2018-09-15 LAB — NOVEL CORONAVIRUS, NAA (HOSP ORDER, SEND-OUT TO REF LAB; TAT 18-24 HRS): SARS-CoV-2, NAA: NOT DETECTED

## 2018-09-18 ENCOUNTER — Ambulatory Visit (HOSPITAL_COMMUNITY): Payer: Medicare Other | Admitting: Anesthesiology

## 2018-09-18 ENCOUNTER — Ambulatory Visit (HOSPITAL_COMMUNITY)
Admission: RE | Admit: 2018-09-18 | Discharge: 2018-09-18 | Disposition: A | Discharge status: Home / Self Care | Payer: Medicare Other | Attending: General Surgery | Admitting: General Surgery

## 2018-09-18 ENCOUNTER — Other Ambulatory Visit: Payer: Self-pay

## 2018-09-18 ENCOUNTER — Encounter (HOSPITAL_COMMUNITY): Payer: Self-pay | Admitting: *Deleted

## 2018-09-18 ENCOUNTER — Encounter (HOSPITAL_COMMUNITY)
Admission: RE | Disposition: A | Discharge status: Home / Self Care | Payer: Self-pay | Source: Home / Self Care | Attending: General Surgery

## 2018-09-18 DIAGNOSIS — K8000 Calculus of gallbladder with acute cholecystitis without obstruction: Secondary | ICD-10-CM

## 2018-09-18 DIAGNOSIS — I5042 Chronic combined systolic (congestive) and diastolic (congestive) heart failure: Secondary | ICD-10-CM | POA: Insufficient documentation

## 2018-09-18 DIAGNOSIS — Z8673 Personal history of transient ischemic attack (TIA), and cerebral infarction without residual deficits: Secondary | ICD-10-CM | POA: Diagnosis not present

## 2018-09-18 DIAGNOSIS — Z7901 Long term (current) use of anticoagulants: Secondary | ICD-10-CM | POA: Insufficient documentation

## 2018-09-18 DIAGNOSIS — N184 Chronic kidney disease, stage 4 (severe): Secondary | ICD-10-CM | POA: Diagnosis not present

## 2018-09-18 DIAGNOSIS — Z1159 Encounter for screening for other viral diseases: Secondary | ICD-10-CM | POA: Diagnosis not present

## 2018-09-18 DIAGNOSIS — D649 Anemia, unspecified: Secondary | ICD-10-CM | POA: Diagnosis not present

## 2018-09-18 DIAGNOSIS — Z79899 Other long term (current) drug therapy: Secondary | ICD-10-CM | POA: Insufficient documentation

## 2018-09-18 DIAGNOSIS — K219 Gastro-esophageal reflux disease without esophagitis: Secondary | ICD-10-CM | POA: Diagnosis not present

## 2018-09-18 DIAGNOSIS — K8010 Calculus of gallbladder with chronic cholecystitis without obstruction: Secondary | ICD-10-CM | POA: Insufficient documentation

## 2018-09-18 DIAGNOSIS — E785 Hyperlipidemia, unspecified: Secondary | ICD-10-CM | POA: Insufficient documentation

## 2018-09-18 DIAGNOSIS — Z955 Presence of coronary angioplasty implant and graft: Secondary | ICD-10-CM | POA: Diagnosis not present

## 2018-09-18 DIAGNOSIS — I252 Old myocardial infarction: Secondary | ICD-10-CM | POA: Diagnosis not present

## 2018-09-18 DIAGNOSIS — Z8547 Personal history of malignant neoplasm of testis: Secondary | ICD-10-CM | POA: Insufficient documentation

## 2018-09-18 DIAGNOSIS — I739 Peripheral vascular disease, unspecified: Secondary | ICD-10-CM | POA: Insufficient documentation

## 2018-09-18 DIAGNOSIS — I2510 Atherosclerotic heart disease of native coronary artery without angina pectoris: Secondary | ICD-10-CM | POA: Diagnosis not present

## 2018-09-18 DIAGNOSIS — I130 Hypertensive heart and chronic kidney disease with heart failure and stage 1 through stage 4 chronic kidney disease, or unspecified chronic kidney disease: Secondary | ICD-10-CM | POA: Insufficient documentation

## 2018-09-18 DIAGNOSIS — F17210 Nicotine dependence, cigarettes, uncomplicated: Secondary | ICD-10-CM | POA: Insufficient documentation

## 2018-09-18 DIAGNOSIS — I4821 Permanent atrial fibrillation: Secondary | ICD-10-CM | POA: Insufficient documentation

## 2018-09-18 DIAGNOSIS — K8070 Calculus of gallbladder and bile duct without cholecystitis without obstruction: Secondary | ICD-10-CM | POA: Diagnosis not present

## 2018-09-18 HISTORY — PX: CHOLECYSTECTOMY: SHX55

## 2018-09-18 LAB — PROTIME-INR
INR: 1.4 — ABNORMAL HIGH (ref 0.8–1.2)
Prothrombin Time: 16.7 seconds — ABNORMAL HIGH (ref 11.4–15.2)

## 2018-09-18 SURGERY — LAPAROSCOPIC CHOLECYSTECTOMY
Anesthesia: General

## 2018-09-18 MED ORDER — PROPOFOL 10 MG/ML IV BOLUS
INTRAVENOUS | Status: AC
  Filled 2018-09-18: qty 40

## 2018-09-18 MED ORDER — LIDOCAINE 2% (20 MG/ML) 5 ML SYRINGE
INTRAMUSCULAR | Status: DC | PRN
  Administered 2018-09-18: 40 mg via INTRAVENOUS

## 2018-09-18 MED ORDER — POVIDONE-IODINE 10 % EX OINT
TOPICAL_OINTMENT | CUTANEOUS | Status: AC
  Filled 2018-09-18: qty 1

## 2018-09-18 MED ORDER — LACTATED RINGERS IV SOLN
INTRAVENOUS | Status: DC
  Administered 2018-09-18: 07:00:00 via INTRAVENOUS

## 2018-09-18 MED ORDER — CHLORHEXIDINE GLUCONATE CLOTH 2 % EX PADS: 6.0000 | MEDICATED_PAD | Freq: Once | CUTANEOUS | Status: DC

## 2018-09-18 MED ORDER — LACTATED RINGERS IV SOLN: INTRAVENOUS | Status: DC

## 2018-09-18 MED ORDER — DEXAMETHASONE SODIUM PHOSPHATE 4 MG/ML IJ SOLN
INTRAMUSCULAR | Status: DC | PRN
  Administered 2018-09-18: 8 mg via INTRAVENOUS

## 2018-09-18 MED ORDER — CIPROFLOXACIN IN D5W 400 MG/200ML IV SOLN
INTRAVENOUS | Status: AC
  Filled 2018-09-18: qty 200

## 2018-09-18 MED ORDER — ONDANSETRON HCL 4 MG/2ML IJ SOLN
INTRAMUSCULAR | Status: AC
  Filled 2018-09-18: qty 2

## 2018-09-18 MED ORDER — SUGAMMADEX SODIUM 200 MG/2ML IV SOLN
INTRAVENOUS | Status: DC | PRN
  Administered 2018-09-18: 165.2 mg via INTRAVENOUS

## 2018-09-18 MED ORDER — CIPROFLOXACIN IN D5W 400 MG/200ML IV SOLN
400.0000 mg | INTRAVENOUS | Status: AC
  Administered 2018-09-18: 400 mg via INTRAVENOUS

## 2018-09-18 MED ORDER — BUPIVACAINE LIPOSOME 1.3 % IJ SUSP
INTRAMUSCULAR | Status: DC | PRN
  Administered 2018-09-18: 20 mL

## 2018-09-18 MED ORDER — SUCCINYLCHOLINE CHLORIDE 20 MG/ML IJ SOLN
INTRAMUSCULAR | Status: DC | PRN
  Administered 2018-09-18: 160 mg via INTRAVENOUS

## 2018-09-18 MED ORDER — SODIUM CHLORIDE 0.9 % IR SOLN
Status: DC | PRN
  Administered 2018-09-18: 1000 mL

## 2018-09-18 MED ORDER — HYDROCODONE-ACETAMINOPHEN 7.5-325 MG PO TABS: 1.0000 | ORAL_TABLET | Freq: Once | ORAL | Status: DC | PRN

## 2018-09-18 MED ORDER — MEPERIDINE HCL 50 MG/ML IJ SOLN: 6.2500 mg | INTRAMUSCULAR | Status: DC | PRN

## 2018-09-18 MED ORDER — ARTIFICIAL TEARS OPHTHALMIC OINT
TOPICAL_OINTMENT | OPHTHALMIC | Status: AC
  Filled 2018-09-18: qty 3.5

## 2018-09-18 MED ORDER — PROPOFOL 10 MG/ML IV BOLUS
INTRAVENOUS | Status: DC | PRN
  Administered 2018-09-18: 130 mg via INTRAVENOUS

## 2018-09-18 MED ORDER — PROMETHAZINE HCL 25 MG/ML IJ SOLN: 6.2500 mg | INTRAMUSCULAR | Status: DC | PRN

## 2018-09-18 MED ORDER — HEMOSTATIC AGENTS (NO CHARGE) OPTIME
TOPICAL | Status: DC | PRN
  Administered 2018-09-18 (×2): 1 via TOPICAL

## 2018-09-18 MED ORDER — GLYCOPYRROLATE PF 0.2 MG/ML IJ SOSY
PREFILLED_SYRINGE | INTRAMUSCULAR | Status: DC | PRN
  Administered 2018-09-18: .2 mg via INTRAVENOUS

## 2018-09-18 MED ORDER — HYDROMORPHONE HCL 1 MG/ML IJ SOLN
0.2500 mg | INTRAMUSCULAR | Status: DC | PRN
  Administered 2018-09-18: 0.5 mg via INTRAVENOUS
  Filled 2018-09-18: qty 0.5

## 2018-09-18 MED ORDER — OXYCODONE-ACETAMINOPHEN 7.5-325 MG PO TABS: 1.0000 | ORAL_TABLET | Freq: Four times a day (QID) | ORAL | 0 refills | Status: DC | PRN

## 2018-09-18 MED ORDER — ROCURONIUM BROMIDE 50 MG/5ML IV SOSY
PREFILLED_SYRINGE | INTRAVENOUS | Status: DC | PRN
  Administered 2018-09-18: 30 mg via INTRAVENOUS

## 2018-09-18 MED ORDER — FENTANYL CITRATE (PF) 250 MCG/5ML IJ SOLN
INTRAMUSCULAR | Status: AC
  Filled 2018-09-18: qty 5

## 2018-09-18 MED ORDER — DEXAMETHASONE SODIUM PHOSPHATE 10 MG/ML IJ SOLN
INTRAMUSCULAR | Status: AC
  Filled 2018-09-18: qty 1

## 2018-09-18 MED ORDER — KETOROLAC TROMETHAMINE 30 MG/ML IJ SOLN
15.0000 mg | Freq: Once | INTRAMUSCULAR | Status: AC
  Administered 2018-09-18: 15 mg via INTRAVENOUS
  Filled 2018-09-18: qty 1

## 2018-09-18 MED ORDER — FENTANYL CITRATE (PF) 100 MCG/2ML IJ SOLN
INTRAMUSCULAR | Status: DC | PRN
  Administered 2018-09-18 (×5): 50 ug via INTRAVENOUS

## 2018-09-18 MED ORDER — BUPIVACAINE LIPOSOME 1.3 % IJ SUSP
INTRAMUSCULAR | Status: AC
  Filled 2018-09-18: qty 20

## 2018-09-18 MED ORDER — ONDANSETRON HCL 4 MG/2ML IJ SOLN
INTRAMUSCULAR | Status: DC | PRN
  Administered 2018-09-18: 4 mg via INTRAVENOUS

## 2018-09-18 SURGICAL SUPPLY — 47 items
APPLIER CLIP ROT 10 11.4 M/L (STAPLE) ×2
BAG RETRIEVAL 10 (BASKET) ×1
CHLORAPREP W/TINT 26 (MISCELLANEOUS) ×2 IMPLANT
CLIP APPLIE ROT 10 11.4 M/L (STAPLE) ×1 IMPLANT
CLOTH BEACON ORANGE TIMEOUT ST (SAFETY) ×2 IMPLANT
COVER LIGHT HANDLE STERIS (MISCELLANEOUS) ×4 IMPLANT
COVER WAND RF STERILE (DRAPES) ×2 IMPLANT
DERMABOND ADVANCED (GAUZE/BANDAGES/DRESSINGS) ×1
DERMABOND ADVANCED .7 DNX12 (GAUZE/BANDAGES/DRESSINGS) ×1 IMPLANT
ELECT REM PT RETURN 9FT ADLT (ELECTROSURGICAL) ×2
ELECTRODE REM PT RTRN 9FT ADLT (ELECTROSURGICAL) ×1 IMPLANT
FILTER SMOKE EVAC LAPAROSHD (FILTER) ×2 IMPLANT
GLOVE BIO SURGEON STRL SZ 6.5 (GLOVE) ×2 IMPLANT
GLOVE BIO SURGEON STRL SZ7 (GLOVE) ×2 IMPLANT
GLOVE BIOGEL M 6.5 STRL (GLOVE) ×2 IMPLANT
GLOVE BIOGEL PI IND STRL 6.5 (GLOVE) ×2 IMPLANT
GLOVE BIOGEL PI IND STRL 7.0 (GLOVE) ×2 IMPLANT
GLOVE BIOGEL PI INDICATOR 6.5 (GLOVE) ×2
GLOVE BIOGEL PI INDICATOR 7.0 (GLOVE) ×2
GLOVE SURG SS PI 7.5 STRL IVOR (GLOVE) ×2 IMPLANT
GOWN STRL REUS W/TWL LRG LVL3 (GOWN DISPOSABLE) ×6 IMPLANT
HEMOSTAT SNOW SURGICEL 2X4 (HEMOSTASIS) ×4 IMPLANT
INST SET LAPROSCOPIC AP (KITS) ×2 IMPLANT
KIT TURNOVER KIT A (KITS) ×2 IMPLANT
MANIFOLD NEPTUNE II (INSTRUMENTS) ×2 IMPLANT
NEEDLE HYPO 18GX1.5 BLUNT FILL (NEEDLE) ×2 IMPLANT
NEEDLE HYPO 22GX1.5 SAFETY (NEEDLE) ×2 IMPLANT
NEEDLE INSUFFLATION 14GA 120MM (NEEDLE) ×2 IMPLANT
NS IRRIG 1000ML POUR BTL (IV SOLUTION) ×2 IMPLANT
PACK LAP CHOLE LZT030E (CUSTOM PROCEDURE TRAY) ×2 IMPLANT
PAD ARMBOARD 7.5X6 YLW CONV (MISCELLANEOUS) ×2 IMPLANT
SET BASIN LINEN APH (SET/KITS/TRAYS/PACK) ×2 IMPLANT
SET TUBE IRRIG SUCTION NO TIP (IRRIGATION / IRRIGATOR) IMPLANT
SLEEVE ENDOPATH XCEL 5M (ENDOMECHANICALS) ×2 IMPLANT
SPONGE GAUZE 2X2 8PLY STRL LF (GAUZE/BANDAGES/DRESSINGS) ×8 IMPLANT
SUT MNCRL AB 4-0 PS2 18 (SUTURE) ×4 IMPLANT
SUT VICRYL 0 UR6 27IN ABS (SUTURE) ×2 IMPLANT
SYR 20CC LL (SYRINGE) ×2 IMPLANT
SYR 30ML LL (SYRINGE) ×2 IMPLANT
SYS BAG RETRIEVAL 10MM (BASKET) ×1
SYSTEM BAG RETRIEVAL 10MM (BASKET) ×1 IMPLANT
TROCAR ENDO BLADELESS 11MM (ENDOMECHANICALS) ×2 IMPLANT
TROCAR XCEL NON-BLD 5MMX100MML (ENDOMECHANICALS) ×2 IMPLANT
TROCAR XCEL UNIV SLVE 11M 100M (ENDOMECHANICALS) ×2 IMPLANT
TUBE CONNECTING 12X1/4 (SUCTIONS) ×2 IMPLANT
TUBING INSUFFLATION (TUBING) ×2 IMPLANT
WARMER LAPAROSCOPE (MISCELLANEOUS) ×2 IMPLANT

## 2018-09-18 NOTE — Anesthesia Procedure Notes (Signed)
Procedure Name: Intubation Date/Time: 09/18/2018 7:56 AM Performed by: Andree Elk, Jkwon Treptow A, CRNA Pre-anesthesia Checklist: Patient identified, Patient being monitored, Timeout performed, Emergency Drugs available and Suction available Patient Re-evaluated:Patient Re-evaluated prior to induction Oxygen Delivery Method: Circle system utilized Preoxygenation: Pre-oxygenation with 100% oxygen Induction Type: IV induction and Rapid sequence Laryngoscope Size: 3 and Glidescope Grade View: Grade I Tube type: Oral Tube size: 7.0 mm Number of attempts: 1 Airway Equipment and Method: Stylet Placement Confirmation: ETT inserted through vocal cords under direct vision,  positive ETCO2 and breath sounds checked- equal and bilateral Secured at: 21 cm Tube secured with: Tape Dental Injury: Teeth and Oropharynx as per pre-operative assessment

## 2018-09-18 NOTE — Op Note (Signed)
Patient:  EISSA BUCHBERGER  DOB:  09/06/1950  MRN:  [id number]   Preop Diagnosis: Cholecystitis, cholelithiasis  Postop Diagnosis: Same  Procedure: Laparoscopic cholecystectomy  Surgeon: Aviva Signs, MD  Assistant: Curlene Labrum, MD  Anes: General endotracheal  Indications: Patient is a 68 year old white male who presents with biliary colic secondary to cholelithiasis.  The risks and benefits of the procedure including bleeding, infection, cardiopulmonary difficulties, hepatobiliary injury, and the possibility of an open procedure were fully explained to the patient, who gave informed consent.  His INR at the time of surgery was 1.4.  Procedure note: The patient was placed in the supine position.  After induction of general endotracheal anesthesia, the abdomen was prepped and draped using the usual sterile technique with ChloraPrep.  Surgical site confirmation was performed.  A supraumbilical incision was made down to the fascia.  A Veress needle was introduced into the abdominal cavity and confirmation of placement was done using saline drop test.  The abdomen was then insufflated to 15 mmHg pressure.  An 11 mm trocar was introduced into the abdominal cavity under direct visualization without difficulty.  Additional 11 mm trochars were placed in the epigastric region and 5 mm trochars were placed in the right upper quadrant and right flank regions once omental adhesions to the anterior abdominal wall were taken down sharply.  The patient was in reverse Trendelenburg position.  The gallbladder wall was noted to be slightly edematous.  The gallbladder was retracted in a dynamic fashion in order to provide a critical view of the triangle of Calot.  The cystic duct was first identified.  Its juncture to the infundibulum was fully identified.  Endoclips were placed proximally and distally on the cystic duct and the cystic duct was divided.  This was likewise done to the cystic artery.  The  gallbladder was freed away from the gallbladder fossa using Bovie electrocautery.  The gallbladder was delivered through the epigastric trocar site using an Endo Catch bag.  The gallbladder fossa was inspected and no abnormal bleeding or bile leakage was noted.  Surgicel was placed in the gallbladder fossa.  All fluid and air were then evacuated from the abdominal cavity prior to removal of the trochars.  All wounds were irrigated with normal saline.  All wounds were injected with Exparel.  The epigastric fascia as well as supraumbilical fascia were reapproximated using 0 Vicryl interrupted sutures.  All skin incisions were closed using a 4-0 Monocryl subcuticular suture.  Dermabond was applied.  All tape and needle counts were correct at the end of the procedure.  The patient was extubated in the operating room and transferred to PACU in stable condition.  Complications: None  EBL: Minimal  Specimen: Gallbladder

## 2018-09-18 NOTE — Anesthesia Postprocedure Evaluation (Signed)
Anesthesia Post Note  Patient: [name] [name]  Procedure(s) Performed: LAPAROSCOPIC CHOLECYSTECTOMY (N/A )  Patient location during evaluation: PACU Anesthesia Type: General Level of consciousness: awake and alert and oriented Pain management: pain level controlled Vital Signs Assessment: post-procedure vital signs reviewed and stable Respiratory status: spontaneous breathing Cardiovascular status: stable Postop Assessment: no apparent nausea or vomiting Anesthetic complications: no     Last Vitals:  Vitals:   09/18/18 0905 09/18/18 0915  BP: 127/78 131/73  Pulse: 65 64  Resp: 20 17  Temp: 36.4 C   SpO2: 99% 99%    Last Pain:  Vitals:   09/18/18 0915  TempSrc:   PainSc: 7                  Wandalene Abrams A

## 2018-09-18 NOTE — Anesthesia Preprocedure Evaluation (Signed)
Anesthesia Evaluation    Airway Mallampati: III       Dental  (+) Poor Dentition   Pulmonary Current Smoker,    breath sounds clear to auscultation + decreased breath sounds      Cardiovascular hypertension, + CAD, + Past MI, + Cardiac Stents, + Peripheral Vascular Disease and +CHF  Atrial Fibrillation  Rhythm:irregular     Neuro/Psych CVA    GI/Hepatic hiatal hernia, GERD  ,  Endo/Other    Renal/GU Renal disease     Musculoskeletal   Abdominal   Peds  Hematology  (+) Blood dyscrasia, anemia ,   Anesthesia Other Findings CVA 2004 PVD, AAA s/p repair 2017 ASCVD, MI 2000 s/p stent Chronic Afib, anticoagulated Dialysis for failure for 3 weeks in 2017  Reproductive/Obstetrics                             Anesthesia Physical Anesthesia Plan  ASA: IV  Anesthesia Plan: General   Post-op Pain Management:    Induction:   PONV Risk Score and Plan:   Airway Management Planned:   Additional Equipment:   Intra-op Plan:   Post-operative Plan:   Informed Consent: I have reviewed the patients History and Physical, chart, labs and discussed the procedure including the risks, benefits and alternatives for the proposed anesthesia with the patient or authorized representative who has indicated his/her understanding and acceptance.     Dental Advisory Given  Plan Discussed with: Anesthesiologist  Anesthesia Plan Comments:         Anesthesia Quick Evaluation

## 2018-09-18 NOTE — Transfer of Care (Signed)
Immediate Anesthesia Transfer of Care Note  Patient: [name] [name]  Procedure(s) Performed: LAPAROSCOPIC CHOLECYSTECTOMY (N/A )  Patient Location: PACU  Anesthesia Type:General  Level of Consciousness: awake, oriented and patient cooperative  Airway & Oxygen Therapy: Patient Spontanous Breathing and Patient connected to face mask oxygen  Post-op Assessment: Report given to RN and Post -op Vital signs reviewed and stable  Post vital signs: Reviewed and stable  Last Vitals:  Vitals Value Taken Time  BP 127/78 09/18/2018  9:05 AM  Temp    Pulse 56 09/18/2018  9:11 AM  Resp 22 09/18/2018  9:11 AM  SpO2 100 % 09/18/2018  9:11 AM  Vitals shown include unvalidated device data.  Last Pain:  Vitals:   09/18/18 0643  TempSrc: Oral  PainSc: 0-No pain      Patients Stated Pain Goal: 3 (41/03/01 3143)  Complications: No apparent anesthesia complications

## 2018-09-18 NOTE — Interval H&P Note (Signed)
History and Physical Interval Note:  09/18/2018 7:12 AM  [name] [name]  has presented today for surgery, with the diagnosis of biliary colic.  The various methods of treatment have been discussed with the patient and family. After consideration of risks, benefits and other options for treatment, the patient has consented to  Procedure(s): LAPAROSCOPIC CHOLECYSTECTOMY (N/A) as a surgical intervention.  The patient's history has been reviewed, patient examined, no change in status, stable for surgery.  I have reviewed the patient's chart and labs.  Questions were answered to the patient's satisfaction.     Aviva Signs

## 2018-09-18 NOTE — Discharge Instructions (Signed)
Restart Coumadin tomorrow 09/19/18 as per Cardiology recommendation     Laparoscopic Cholecystectomy, Care After This sheet gives you information about how to care for yourself after your procedure. Your health care provider may also give you more specific instructions. If you have problems or questions, contact your health care provider. What can I expect after the procedure? After the procedure, it is common to have:  Pain at your incision sites. You will be given medicines to control this pain.  Mild nausea or vomiting.  Bloating and possible shoulder pain from the air-like gas that was used during the procedure. Follow these instructions at home: Incision care   Follow instructions from your health care provider about how to take care of your incisions. Make sure you: ? Wash your hands with soap and water before you change your bandage (dressing). If soap and water are not available, use hand sanitizer. ? Change your dressing as told by your health care provider. ? Leave stitches (sutures), skin glue, or adhesive strips in place. These skin closures may need to be in place for 2 weeks or longer. If adhesive strip edges start to loosen and curl up, you may trim the loose edges. Do not remove adhesive strips completely unless your health care provider tells you to do that.  Do not take baths, swim, or use a hot tub until your health care provider approves. Ask your health care provider if you can take showers. You may only be allowed to take sponge baths for bathing.  Check your incision area every day for signs of infection. Check for: ? More redness, swelling, or pain. ? More fluid or blood. ? Warmth. ? Pus or a bad smell. Activity  Do not drive or use heavy machinery while taking prescription pain medicine.  Do not lift anything that is heavier than 10 lb (4.5 kg) until your health care provider approves.  Do not play contact sports until your health care provider  approves.  Do not drive for 24 hours if you were given a medicine to help you relax (sedative).  Rest as needed. Do not return to work or school until your health care provider approves. General instructions  Take over-the-counter and prescription medicines only as told by your health care provider.  To prevent or treat constipation while you are taking prescription pain medicine, your health care provider may recommend that you: ? Drink enough fluid to keep your urine clear or pale yellow. ? Take over-the-counter or prescription medicines. ? Eat foods that are high in fiber, such as fresh fruits and vegetables, whole grains, and beans. ? Limit foods that are high in fat and processed sugars, such as fried and sweet foods. Contact a health care provider if:  You develop a rash.  You have more redness, swelling, or pain around your incisions.  You have more fluid or blood coming from your incisions.  Your incisions feel warm to the touch.  You have pus or a bad smell coming from your incisions.  You have a fever.  One or more of your incisions breaks open. Get help right away if:  You have trouble breathing.  You have chest pain.  You have increasing pain in your shoulders.  You faint or feel dizzy when you stand.  You have severe pain in your abdomen.  You have nausea or vomiting that lasts for more than one day.  You have leg pain. This information is not intended to replace advice given to you by  your health care provider. Make sure you discuss any questions you have with your health care provider. Document Released: 04/19/2005 Document Revised: 11/08/2015 Document Reviewed: 10/06/2015 Elsevier Interactive Patient Education  2019 [employer] Anesthesia, Adult, Care After This sheet gives you information about how to care for yourself after your procedure. Your health care provider may also give you more specific instructions. If you have problems or  questions, contact your health care provider. What can I expect after the procedure? After the procedure, the following side effects are common:  Pain or discomfort at the IV site.  Nausea.  Vomiting.  Sore throat.  Trouble concentrating.  Feeling cold or chills.  Weak or tired.  Sleepiness and fatigue.  Soreness and body aches. These side effects can affect parts of the body that were not involved in surgery. Follow these instructions at home:  For at least 24 hours after the procedure:  Have a responsible adult stay with you. It is important to have someone help care for you until you are awake and alert.  Rest as needed.  Do not: ? Participate in activities in which you could fall or become injured. ? Drive. ? Use heavy machinery. ? Drink alcohol. ? Take sleeping pills or medicines that cause drowsiness. ? Make important decisions or sign legal documents. ? Take care of children on your own. Eating and drinking  Follow any instructions from your health care provider about eating or drinking restrictions.  When you feel hungry, start by eating small amounts of foods that are soft and easy to digest (bland), such as toast. Gradually return to your regular diet.  Drink enough fluid to keep your urine pale yellow.  If you vomit, rehydrate by drinking water, juice, or clear broth. General instructions  If you have sleep apnea, surgery and certain medicines can increase your risk for breathing problems. Follow instructions from your health care provider about wearing your sleep device: ? Anytime you are sleeping, including during daytime naps. ? While taking prescription pain medicines, sleeping medicines, or medicines that make you drowsy.  Return to your normal activities as told by your health care provider. Ask your health care provider what activities are safe for you.  Take over-the-counter and prescription medicines only as told by your health care  provider.  If you smoke, do not smoke without supervision.  Keep all follow-up visits as told by your health care provider. This is important. Contact a health care provider if:  You have nausea or vomiting that does not get better with medicine.  You cannot eat or drink without vomiting.  You have pain that does not get better with medicine.  You are unable to pass urine.  You develop a skin rash.  You have a fever.  You have redness around your IV site that gets worse. Get help right away if:  You have difficulty breathing.  You have chest pain.  You have blood in your urine or stool, or you vomit blood. Summary  After the procedure, it is common to have a sore throat or nausea. It is also common to feel tired.  Have a responsible adult stay with you for the first 24 hours after general anesthesia. It is important to have someone help care for you until you are awake and alert.  When you feel hungry, start by eating small amounts of foods that are soft and easy to digest (bland), such as toast. Gradually return to your regular diet.  Drink  enough fluid to keep your urine pale yellow.  Return to your normal activities as told by your health care provider. Ask your health care provider what activities are safe for you. This information is not intended to replace advice given to you by your health care provider. Make sure you discuss any questions you have with your health care provider. Document Released: 07/26/2000 Document Revised: 12/03/2016 Document Reviewed: 12/03/2016 Elsevier Interactive Patient Education  2019 [employer].

## 2018-09-19 ENCOUNTER — Encounter (HOSPITAL_COMMUNITY): Payer: Self-pay | Admitting: General Surgery

## 2018-09-28 ENCOUNTER — Telehealth (INDEPENDENT_AMBULATORY_CARE_PROVIDER_SITE_OTHER): Payer: Self-pay | Admitting: General Surgery

## 2018-09-28 ENCOUNTER — Encounter: Payer: Self-pay | Admitting: General Surgery

## 2018-09-28 DIAGNOSIS — Z09 Encounter for follow-up examination after completed treatment for conditions other than malignant neoplasm: Secondary | ICD-10-CM

## 2018-09-28 NOTE — Telephone Encounter (Signed)
Attempted to contact patient for postoperative visit.  Message left to call me should any problems arise.

## 2018-10-16 ENCOUNTER — Ambulatory Visit (INDEPENDENT_AMBULATORY_CARE_PROVIDER_SITE_OTHER): Payer: Medicare Other | Admitting: *Deleted

## 2018-10-16 DIAGNOSIS — Z5181 Encounter for therapeutic drug level monitoring: Secondary | ICD-10-CM | POA: Diagnosis not present

## 2018-10-16 DIAGNOSIS — I4891 Unspecified atrial fibrillation: Secondary | ICD-10-CM

## 2018-10-16 LAB — POCT INR: INR: 5.1 — AB (ref 2.0–3.0)

## 2018-10-16 NOTE — Patient Instructions (Signed)
Lap Cholecystectomy on 5/18 by Dr Floy Sabina coumadin 4 days before procedure.   Hold coumadin tonight and tomorrow night then decrease dose to 1 tablet daily Recheck on 11/13/18

## 2018-11-02 ENCOUNTER — Other Ambulatory Visit: Payer: Self-pay | Admitting: Cardiology

## 2018-11-02 ENCOUNTER — Other Ambulatory Visit: Payer: Self-pay | Admitting: Family Medicine

## 2018-11-13 ENCOUNTER — Telehealth: Payer: Self-pay | Admitting: *Deleted

## 2018-11-13 NOTE — Telephone Encounter (Signed)

## 2018-11-14 ENCOUNTER — Ambulatory Visit (INDEPENDENT_AMBULATORY_CARE_PROVIDER_SITE_OTHER): Payer: Medicare Other | Admitting: *Deleted

## 2018-11-14 DIAGNOSIS — I4891 Unspecified atrial fibrillation: Secondary | ICD-10-CM

## 2018-11-14 DIAGNOSIS — Z5181 Encounter for therapeutic drug level monitoring: Secondary | ICD-10-CM

## 2018-11-14 LAB — POCT INR: INR: 2.1 (ref 2.0–3.0)

## 2018-11-14 NOTE — Patient Instructions (Signed)
Continue coumadin 1 tablet daily Recheck on 12/13/18

## 2018-11-22 ENCOUNTER — Encounter: Payer: Self-pay | Admitting: Cardiology

## 2018-11-22 ENCOUNTER — Ambulatory Visit (INDEPENDENT_AMBULATORY_CARE_PROVIDER_SITE_OTHER): Payer: Medicare Other | Admitting: Cardiology

## 2018-11-22 ENCOUNTER — Other Ambulatory Visit: Payer: Self-pay

## 2018-11-22 VITALS — BP 158/80 | HR 68 | Temp 97.1°F | Ht 72.0 in | Wt 178.0 lb

## 2018-11-22 DIAGNOSIS — I10 Essential (primary) hypertension: Secondary | ICD-10-CM

## 2018-11-22 DIAGNOSIS — I4891 Unspecified atrial fibrillation: Secondary | ICD-10-CM | POA: Diagnosis not present

## 2018-11-22 DIAGNOSIS — I255 Ischemic cardiomyopathy: Secondary | ICD-10-CM

## 2018-11-22 DIAGNOSIS — I2510 Atherosclerotic heart disease of native coronary artery without angina pectoris: Secondary | ICD-10-CM | POA: Diagnosis not present

## 2018-11-22 DIAGNOSIS — E782 Mixed hyperlipidemia: Secondary | ICD-10-CM | POA: Diagnosis not present

## 2018-11-22 MED ORDER — HYDRALAZINE HCL 50 MG PO TABS: 50.0000 mg | ORAL_TABLET | Freq: Three times a day (TID) | ORAL | 3 refills | Status: DC

## 2018-11-22 NOTE — Progress Notes (Signed)
Clinical Summary [name] [name] is a 68 y.o.male  seen today for follow up of the following medical problems.  1. Afib - no recent palpitations - he is compliant with meds including eliquis.    - eliquis was too expensive, changed to coumadin. Doing well without bleeding - no recent palpitations.     2. CAD - remote cath at [facility]. History of RCA CTO, received stent to OM1.   -denies chest pain, no SOB/DOE>     3. HTN - compliant with meds  4. PAD - s/p repair of juxtarenal abdominal aortic aneurysm, left renal artery bypass, followed by vascular   5. Chronic sysotlic HF/ICM - ehco 08/5807 LVEF 35-40% - Jan 2019 echo LVEF 50%. Abnormal diastolic function - lasix 40mg  daily led to [facility], has done well with 40mg  alternating days with 20mg    - no recent edema. No SOB or DOE   6. HL 08/2017 TC 142 HDL 33 TG 199 LDL 79 - compliant with statin   7. CKD - followed at [facility]   Past Medical History:  Diagnosis Date  . AAA (abdominal aortic aneurysm) ([place])    a. s/p repair 06/2015 with left renal artery bypass at that time, post-op course c/b renal failure requiring dialysis, C dif.  . Anemia   . Arteriosclerotic cardiovascular disease (ASCVD)    a. AMI in 2000 treated at [facility]. b. cath in 12/2006->  Chronic total obstruction of the RCA;  drug-eluting stent placed in OM1, LVEF abnormal.  . Cerebrovascular disease 2002   carotid stent  . Chronic anticoagulation   . Chronic combined systolic and diastolic CHF (congestive heart failure) ([place])   . CKD (chronic kidney disease), stage IV ([place])   . GERD (gastroesophageal reflux disease)   . Hyperlipidemia   . Hypertension   . Myocardial infarction ([place]) 10 yrs ago  . Nephrolithiasis   . Permanent atrial fibrillation   . PVD (peripheral vascular disease) ([place])    Ct angiogram in 2009 revealed stable disease with 80% celiac stenosis,50% right renal artery ,ASCVD with ulceration in the abdominal aortashe   . Testicular carcinoma ([place]) 1990   right orchiectomy  . Tobacco abuse, in remission    20 pack years; quit in 2009     No Known Allergies   Current Outpatient Medications  Medication Sig Dispense Refill  . albuterol (PROVENTIL HFA;VENTOLIN HFA) 108 (90 BASE) MCG/ACT inhaler Inhale 2-4 puffs into the lungs every 4 (four) hours as needed for wheezing or shortness of breath. 1 Inhaler 0  . amLODipine (NORVASC) 10 MG tablet Take 1 tablet by mouth once daily (Patient taking differently: Take 10 mg by mouth daily. ) 90 tablet 0  . atorvastatin (LIPITOR) 40 MG tablet Take 1 tablet by mouth once daily 90 tablet 3  . Ferrous Sulfate (IRON) 325 (65 Fe) MG TABS TAKE 1 TABLET BY MOUTH THREE TIMES DAILY WITH MEALS 270 tablet 0  . fluticasone (FLONASE) 50 MCG/ACT nasal spray Place 1 spray into both nostrils daily as needed for allergies or rhinitis.    . furosemide (LASIX) 40 MG tablet Take 1 tablet (40 mg total) by mouth every other day. Take 1 tablet by mouth once daily (Patient taking differently: Take 20-40 mg by mouth See admin instructions. Take 40 mg by mouth every other day, alternating with 20 mg) 45 tablet 3  . hydrALAZINE (APRESOLINE) 25 MG tablet Take 1.5 tablets (37.5 mg total) by mouth 3 (three) times daily. 405 tablet 3  .  metoprolol succinate (TOPROL-XL) 100 MG 24 hr tablet TAKE 1 & 1/2 (ONE & ONE-HALF) TABLETS BY MOUTH ONCE DAILY (Patient taking differently: Take 150 mg by mouth daily. ) 135 tablet 3  . nitroGLYCERIN (NITROSTAT) 0.4 MG SL tablet place 1 tablet under the tongue if needed every 5 minutes for chest pain for 3 doses IF NO RELIEF AFTER 3RD DOSE CALL PRESCRIBER OR 911. (Patient taking differently: Place 0.4 mg under the tongue every 5 (five) minutes as needed for chest pain. place 1 tablet under the tongue if needed every 5 minutes for chest pain for 3 doses IF NO RELIEF AFTER 3RD DOSE CALL PRESCRIBER OR 911.) 25 tablet 2  . ondansetron (ZOFRAN) 4 MG tablet Take 1 tablet (4 mg  total) by mouth every 8 (eight) hours as needed for nausea or vomiting. 20 tablet 0  . oxyCODONE-acetaminophen (PERCOCET) 7.5-325 MG tablet Take 1 tablet by mouth every 6 (six) hours as needed. 25 tablet 0  . pantoprazole (PROTONIX) 40 MG tablet Take 1 tablet (40 mg total) by mouth daily. 90 tablet 2  . tamsulosin (FLOMAX) 0.4 MG CAPS capsule TAKE 1 CAPSULE BY MOUTH ONCE DAILY AFTER SUPPER 90 capsule 0  . warfarin (COUMADIN) 5 MG tablet Take 1 1/2 tablets daily except 1 tablet on Mondays, Wednesdays and Fridays (Patient taking differently: Take 5-7.5 mg by mouth See admin instructions. Take 5 mg by mouth on Monday, Wednesday and Friday, take 7.5 mg on Tuesday, Thursday, Saturday and Sunday) 120 tablet 1   No current facility-administered medications for this visit.      Past Surgical History:  Procedure Laterality Date  . ABDOMINAL AORTIC ANEURYSM REPAIR N/A 06/09/2015   Procedure: ANEURYSM ABDOMINAL AORTIC REPAIR;  Surgeon: Elam Dutch, MD;  Location: [facility] OR;  Service: Vascular;  Laterality: N/A;  . AORTIC ENDARTERECETOMY N/A 06/09/2015   Procedure: AORTIC ENDARTERECETOMY;  Surgeon: Elam Dutch, MD;  Location: [place];  Service: Vascular;  Laterality: N/A;  . AORTIC/RENAL BYPASS Left 06/09/2015   Procedure: LEFT RENAL Artery BYPASS;  Surgeon: Elam Dutch, MD;  Location: [place];  Service: Vascular;  Laterality: Left;  . APPENDECTOMY  2004  . CHOLECYSTECTOMY N/A 09/18/2018   Procedure: LAPAROSCOPIC CHOLECYSTECTOMY;  Surgeon: Aviva Signs, MD;  Location: [facility] ORS;  Service: General;  Laterality: N/A;  . COLONOSCOPY  06/17/2011   INCOMPLETE, PREP POOR. Procedure: COLONOSCOPY;  Surgeon: Daneil Dolin, MD;  Location: [facility];  Service: Endoscopy;  Laterality: N/A;  10:00  . COLONOSCOPY  07/15/2011   FBP:ZWCHENID rectal and colon polyps  . COLONOSCOPY N/A 05/22/2015   POE:UMPNTIRW colonic and rectal polyps. tubular adenomas.repeat TCS 05/2018  . ESOPHAGEAL DILATION N/A 05/22/2015    Procedure: ESOPHAGEAL DILATION;  Surgeon: Daneil Dolin, MD;  Location: [facility];  Service: Endoscopy;  Laterality: N/A;  . ESOPHAGOGASTRODUODENOSCOPY  06/17/2011   severe erosive/ulcerative reflux esophagitis, soft noncritical stricture dilatied, small hh, antral erosion   . ESOPHAGOGASTRODUODENOSCOPY N/A 05/22/2015   RMR: 2 cm HH otherwise normal. s/p empirical dilation.  . INSERTION OF DIALYSIS CATHETER Left 06/26/2015   Procedure: INSERTION OF DIALYSIS CATHETER;  Surgeon: Angelia Mould, MD;  Location: [place];  Service: Vascular;  Laterality: Left;  . PERIPHERAL VASCULAR CATHETERIZATION N/A 05/28/2015   Procedure: Abdominal Aortogram;  Surgeon: Serafina Mitchell, MD;  Location: [place] [facility];  Service: Cardiovascular;  Laterality: N/A;  . testicular cancer  1990   right orchiectomy     No Known Allergies  Family History  Problem Relation Age of Onset  . Colon cancer Father 88       deceased  . Prostate cancer Father   . Heart disease Mother   . Liver disease Neg Hx   . Anesthesia problems Neg Hx   . Hypotension Neg Hx   . Malignant hyperthermia Neg Hx   . Pseudochol deficiency Neg Hx      Social History Mr. Treto reports that he has been smoking cigarettes. He has a 20.00 pack-year smoking history. He has never used smokeless tobacco. Mr. Hearne reports no history of alcohol use.   Review of Systems CONSTITUTIONAL: No weight loss, fever, chills, weakness or fatigue.  HEENT: Eyes: No visual loss, blurred vision, double vision or yellow sclerae.No hearing loss, sneezing, congestion, runny nose or sore throat.  SKIN: No rash or itching.  CARDIOVASCULAR: per hpi RESPIRATORY: No shortness of breath, cough or sputum.  GASTROINTESTINAL: No anorexia, nausea, vomiting or diarrhea. No abdominal pain or blood.  GENITOURINARY: No burning on urination, no polyuria NEUROLOGICAL: No headache, dizziness, syncope, paralysis, ataxia, numbness or tingling in the extremities.  No change in bowel or bladder control.  MUSCULOSKELETAL: No muscle, back pain, joint pain or stiffness.  LYMPHATICS: No enlarged nodes. No history of splenectomy.  PSYCHIATRIC: No history of depression or anxiety.  ENDOCRINOLOGIC: No reports of sweating, cold or heat intolerance. No polyuria or polydipsia.  [name]   Physical Examination Today's Vitals   11/22/18 0841  BP: (!) 158/80  Pulse: 68  Temp: (!) 97.1 F (36.2 C)  SpO2: 97%  Weight: 178 lb (80.7 kg)  Height: 6' (1.829 m)   Body mass index is 24.14 kg/m.  Gen: resting comfortably, no acute distress HEENT: no scleral icterus, pupils equal round and reactive, no palptable cervical adenopathy,  CV: irreg, no m/r/g, no jvd Resp: Clear to auscultation bilaterally GI: abdomen is soft, non-tender, non-distended, normal bowel sounds, no hepatosplenomegaly MSK: extremities are warm, no edema.  Skin: warm, no rash Neuro:  no focal deficits Psych: appropriate affect   Diagnostic Studies 06/2015 echo - Left ventricle: The cavity size was normal. Systolic function was  moderately reduced. The estimated ejection fraction was in the  range of 35% to 40%. - Pulmonary arteries: PA peak pressure: 33 mm Hg (S). - Inferior vena cava: The vessel was dilated. The respirophasic  diameter changes were blunted (<50%). This is a nonspecific  finding during positive pressure ventilation.  Impressions:  - Technically limited study due to poor echo windows and  tachycardia. Consider TEE if more accurate evaluation is  necessary.   Jan 2019 echo Study Conclusions  - Left ventricle: The cavity size was normal. Wall thickness was increased in a pattern of mild LVH. Systolic function was at the lower limits of normal. The estimated ejection fraction was 50%. Diffuse hypokinesis. The study was not technically sufficient to allow evaluation of LV diastolic dysfunction due to atrial fibrillation. Doppler parameters are  consistent with high ventricular filling pressure. - Aortic valve: Trileaflet; mildly thickened leaflets. - Mitral valve: There was mild regurgitation. - Left atrium: The atrium was moderately dilated. - Right atrium: The atrium was moderately dilated.     Assessment and Plan  1. Afib - no symptoms, continue current meds. NOACs too expensive, continue coumadin  2. CAD - no symptoms, continue current meds  3. HTN - above goal, he will increase hydralazine to 50mg  tid.   4.Ischemic cardiopmyopathy -LVEF has normalized -no recent symptoms, continue current meds. Medical  therapy limited by renal dysfunction  5. Hyperlipidemia - continue statin   F/u 6 months      Arnoldo Lenis, M.D

## 2018-11-22 NOTE — Patient Instructions (Signed)
Medication Instructions:  INCREASE HYDRALAZINE TO 50 MG - THREE TIMES DAILY   Labwork: NONE  Testing/Procedures: NONE  Follow-Up: Your physician wants you to follow-up in: 6 MONTHS.  You will receive a reminder letter in the mail two months in advance. If you don't receive a letter, please call our office to schedule the follow-up appointment.   Any Other Special Instructions Will Be Listed Below (If Applicable).     If you need a refill on your cardiac medications before your next appointment, please call your pharmacy.

## 2018-12-03 ENCOUNTER — Other Ambulatory Visit: Payer: Self-pay | Admitting: Family Medicine

## 2018-12-13 ENCOUNTER — Ambulatory Visit (INDEPENDENT_AMBULATORY_CARE_PROVIDER_SITE_OTHER): Payer: Medicare Other | Admitting: *Deleted

## 2018-12-13 ENCOUNTER — Other Ambulatory Visit: Payer: Self-pay

## 2018-12-13 DIAGNOSIS — I4891 Unspecified atrial fibrillation: Secondary | ICD-10-CM | POA: Diagnosis not present

## 2018-12-13 DIAGNOSIS — Z5181 Encounter for therapeutic drug level monitoring: Secondary | ICD-10-CM

## 2018-12-13 LAB — POCT INR: INR: 1.8 — AB (ref 2.0–3.0)

## 2018-12-13 NOTE — Patient Instructions (Signed)
Take coumadin 1 1/2 tablets tonight then increase dose to 1 tablet daily except 1 1/2 tablets on Tuesdays and Fridays Recheck on 4 wks

## 2019-01-12 ENCOUNTER — Other Ambulatory Visit: Payer: Self-pay

## 2019-01-12 ENCOUNTER — Ambulatory Visit (INDEPENDENT_AMBULATORY_CARE_PROVIDER_SITE_OTHER): Payer: Medicare Other | Admitting: *Deleted

## 2019-01-12 DIAGNOSIS — I4891 Unspecified atrial fibrillation: Secondary | ICD-10-CM | POA: Diagnosis not present

## 2019-01-12 DIAGNOSIS — Z5181 Encounter for therapeutic drug level monitoring: Secondary | ICD-10-CM

## 2019-01-12 LAB — POCT INR: INR: 3 (ref 2.0–3.0)

## 2019-01-12 NOTE — Patient Instructions (Signed)
Continue coumadin 1 tablet daily except 1 1/2 tablets on Tuesdays and Fridays Recheck on 4 wks

## 2019-02-06 ENCOUNTER — Other Ambulatory Visit: Payer: Self-pay | Admitting: Cardiology

## 2019-02-06 ENCOUNTER — Other Ambulatory Visit: Payer: Self-pay | Admitting: Family Medicine

## 2019-02-12 ENCOUNTER — Other Ambulatory Visit: Payer: Self-pay

## 2019-02-12 ENCOUNTER — Ambulatory Visit (INDEPENDENT_AMBULATORY_CARE_PROVIDER_SITE_OTHER): Payer: Medicare Other | Admitting: *Deleted

## 2019-02-12 DIAGNOSIS — I4891 Unspecified atrial fibrillation: Secondary | ICD-10-CM

## 2019-02-12 DIAGNOSIS — Z5181 Encounter for therapeutic drug level monitoring: Secondary | ICD-10-CM

## 2019-02-12 LAB — POCT INR: INR: 2.6 (ref 2.0–3.0)

## 2019-02-12 NOTE — Patient Instructions (Signed)
Continue coumadin 1 tablet daily except 1 1/2 tablets on Tuesdays and Fridays Recheck on 5 wks

## 2019-02-16 ENCOUNTER — Other Ambulatory Visit: Payer: Self-pay | Admitting: Family Medicine

## 2019-03-05 ENCOUNTER — Other Ambulatory Visit: Payer: Self-pay | Admitting: Family Medicine

## 2019-03-31 ENCOUNTER — Other Ambulatory Visit: Payer: Self-pay | Admitting: Cardiology

## 2019-04-02 ENCOUNTER — Other Ambulatory Visit: Payer: Self-pay

## 2019-04-02 ENCOUNTER — Telehealth: Payer: Self-pay | Admitting: Cardiology

## 2019-04-02 ENCOUNTER — Ambulatory Visit (INDEPENDENT_AMBULATORY_CARE_PROVIDER_SITE_OTHER): Payer: Medicare Other | Admitting: *Deleted

## 2019-04-02 DIAGNOSIS — I4891 Unspecified atrial fibrillation: Secondary | ICD-10-CM

## 2019-04-02 DIAGNOSIS — Z5181 Encounter for therapeutic drug level monitoring: Secondary | ICD-10-CM

## 2019-04-02 LAB — POCT INR: INR: 4.3 — AB (ref 2.0–3.0)

## 2019-04-02 NOTE — Telephone Encounter (Signed)
Having BP problems

## 2019-04-02 NOTE — Telephone Encounter (Signed)

## 2019-04-02 NOTE — Telephone Encounter (Signed)
Returned Pt call. No answer. Left msg to call back.

## 2019-04-02 NOTE — Patient Instructions (Signed)
Hold warfarin tonight, take 1 tablet tomorrow then resume 1 tablet daily except 1 1/2 tablets on Tuesdays and Fridays Recheck in 3 wks

## 2019-04-03 NOTE — Telephone Encounter (Signed)
Called pt. No answer, left message for pt to return call.  

## 2019-04-04 ENCOUNTER — Encounter: Payer: Self-pay | Admitting: *Deleted

## 2019-04-04 ENCOUNTER — Telehealth (INDEPENDENT_AMBULATORY_CARE_PROVIDER_SITE_OTHER): Payer: Medicare Other | Admitting: Cardiology

## 2019-04-04 ENCOUNTER — Encounter: Payer: Self-pay | Admitting: Cardiology

## 2019-04-04 VITALS — Ht 72.0 in | Wt 180.0 lb

## 2019-04-04 DIAGNOSIS — I2510 Atherosclerotic heart disease of native coronary artery without angina pectoris: Secondary | ICD-10-CM

## 2019-04-04 DIAGNOSIS — I10 Essential (primary) hypertension: Secondary | ICD-10-CM

## 2019-04-04 DIAGNOSIS — I4891 Unspecified atrial fibrillation: Secondary | ICD-10-CM | POA: Diagnosis not present

## 2019-04-04 DIAGNOSIS — E782 Mixed hyperlipidemia: Secondary | ICD-10-CM

## 2019-04-04 DIAGNOSIS — I255 Ischemic cardiomyopathy: Secondary | ICD-10-CM

## 2019-04-04 DIAGNOSIS — N184 Chronic kidney disease, stage 4 (severe): Secondary | ICD-10-CM

## 2019-04-04 NOTE — Progress Notes (Signed)
Virtual Visit via Telephone Note   This visit type was conducted due to national recommendations for restrictions regarding the COVID-19 Pandemic (e.g. social distancing) in an effort to limit this patient's exposure and mitigate transmission in our community.  Due to his co-morbid illnesses, this patient is at least at moderate risk for complications without adequate follow up.  This format is felt to be most appropriate for this patient at this time.  The patient did not have access to video technology/had technical difficulties with video requiring transitioning to audio format only (telephone).  All issues noted in this document were discussed and addressed.  No physical exam could be performed with this format.  Please refer to the patient's chart for his  consent to telehealth for [facility].   Date:  04/04/2019   ID:  [name] [name], DOB 1950/07/23, MRN [id number]  Patient Location: Home Provider Location: Office  PCP:  Alycia Rossetti, MD  Cardiologist:  Carlyle Dolly, MD  Electrophysiologist:  None   Evaluation Performed:  Follow-Up Visit  Chief Complaint:  Follow up  History of Present Illness:    [name] [name] is a 68 y.o. male seen today for follow up of the following medical problems.  1. Afib - eliquis was too expensive, changed to coumadin.  - no significant palpitations. No bleeding on coumaidn    2. CAD -remotecath at [facility]. History of RCA CTO, received stent to OM1.  Denies any chest pain, no SOB or DOE    3. HTN - family some recent increase sodium intake, had some increasein bp's but trending down now that he has adjusted his diet. Now 130s/80s    4. PAD - s/p repair of juxtarenal abdominal aortic aneurysm, left renal artery bypass, followed by vascular   5. Chronic sysotlic HF/ICM - ehco 06/7515 LVEF 35-40% - Jan 2019 echo LVEF 50%. Abnormal diastolic function - lasix 40mg  daily led to [facility], has done well with 40mg   alternating days with 20mg    - no recent SOB/DOE, no LE edema   6. HL 08/2017 TC 142 HDL 33 TG 199 LDL 79 - he remains compliant with statin - labs followed by pcp   7. CKD IV - followed at [facility]   The patient does not have symptoms concerning for COVID-19 infection (fever, chills, cough, or new shortness of breath).    Past Medical History:  Diagnosis Date  . AAA (abdominal aortic aneurysm) ([place])    a. s/p repair 06/2015 with left renal artery bypass at that time, post-op course c/b renal failure requiring dialysis, C dif.  . Anemia   . Arteriosclerotic cardiovascular disease (ASCVD)    a. AMI in 2000 treated at [facility]. b. cath in 12/2006->  Chronic total obstruction of the RCA;  drug-eluting stent placed in OM1, LVEF abnormal.  . Cerebrovascular disease 2002   carotid stent  . Chronic anticoagulation   . Chronic combined systolic and diastolic CHF (congestive heart failure) ([place])   . CKD (chronic kidney disease), stage IV ([place])   . GERD (gastroesophageal reflux disease)   . Hyperlipidemia   . Hypertension   . Myocardial infarction ([place]) 10 yrs ago  . Nephrolithiasis   . Permanent atrial fibrillation   . PVD (peripheral vascular disease) ([place])    Ct angiogram in 2009 revealed stable disease with 80% celiac stenosis,50% right renal artery ,ASCVD with ulceration in the abdominal aortashe  . Testicular carcinoma ([place]) 1990   right orchiectomy  . Tobacco abuse, in  remission    20 pack years; quit in 2009   Past Surgical History:  Procedure Laterality Date  . ABDOMINAL AORTIC ANEURYSM REPAIR N/A 06/09/2015   Procedure: ANEURYSM ABDOMINAL AORTIC REPAIR;  Surgeon: Elam Dutch, MD;  Location: [facility] OR;  Service: Vascular;  Laterality: N/A;  . AORTIC ENDARTERECETOMY N/A 06/09/2015   Procedure: AORTIC ENDARTERECETOMY;  Surgeon: Elam Dutch, MD;  Location: [place];  Service: Vascular;  Laterality: N/A;  . AORTIC/RENAL BYPASS Left 06/09/2015   Procedure: LEFT  RENAL Artery BYPASS;  Surgeon: Elam Dutch, MD;  Location: [place];  Service: Vascular;  Laterality: Left;  . APPENDECTOMY  2004  . CHOLECYSTECTOMY N/A 09/18/2018   Procedure: LAPAROSCOPIC CHOLECYSTECTOMY;  Surgeon: Aviva Signs, MD;  Location: [facility];  Service: General;  Laterality: N/A;  . COLONOSCOPY  06/17/2011   INCOMPLETE, PREP POOR. Procedure: COLONOSCOPY;  Surgeon: Daneil Dolin, MD;  [facility];  Service: Endoscopy;  Laterality: N/A;  10:00  . COLONOSCOPY  07/15/2011   YWV:[id number] rectal and colon polyps  . COLONOSCOPY N/A 05/22/2015   SWN:[id number] colonic and rectal polyps. tubular adenomas.repeat TCS 05/2018  . ESOPHAGEAL DILATION N/A 05/22/2015   Procedure: ESOPHAGEAL DILATION;  Surgeon: Daneil Dolin, MD;  [facility];  Service: Endoscopy;  Laterality: N/A;  . ESOPHAGOGASTRODUODENOSCOPY  06/17/2011   severe erosive/ulcerative reflux esophagitis, soft noncritical stricture dilatied, small hh, antral erosion   . ESOPHAGOGASTRODUODENOSCOPY N/A 05/22/2015   RMR: 2 cm HH otherwise normal. s/p empirical dilation.  . INSERTION OF DIALYSIS CATHETER Left 06/26/2015   Procedure: INSERTION OF DIALYSIS CATHETER;  Surgeon: Angelia Mould, MD;  Location: [place];  Service: Vascular;  Laterality: Left;  . PERIPHERAL VASCULAR CATHETERIZATION N/A 05/28/2015   Procedure: Abdominal Aortogram;  Surgeon: Serafina Mitchell, MD;  Location: [place] [facility];  Service: Cardiovascular;  Laterality: N/A;  . testicular cancer  1990   right orchiectomy     No outpatient medications have been marked as taking for the 04/04/19 encounter (Appointment) with Arnoldo Lenis, MD.     Allergies:   Patient has no known allergies.   Social History   Tobacco Use  . Smoking status: Current Some Day Smoker    Packs/day: 0.50    Years: 40.00    Pack years: 20.00    Types: Cigarettes  . Smokeless tobacco: Never Used  . Tobacco comment: 1/2 pack daily  Substance Use Topics  .  Alcohol use: No    Alcohol/week: 0.0 standard drinks  . Drug use: No     Family Hx: The patient's family history includes Colon cancer (age of onset: 58) in his father; Heart disease in his mother; Prostate cancer in his father. There is no history of Liver disease, Anesthesia problems, Hypotension, Malignant hyperthermia, or Pseudochol deficiency.  ROS:   Please see the history of present illness.     All other systems reviewed and are negative.   Prior CV studies:   The following studies were reviewed today:  06/2015 echo - Left ventricle: The cavity size was normal. Systolic function was  moderately reduced. The estimated ejection fraction was in the  range of 35% to 40%. - Pulmonary arteries: PA peak pressure: 33 mm Hg (S). - Inferior vena cava: The vessel was dilated. The respirophasic  diameter changes were blunted (<50%). This is a nonspecific  finding during positive pressure ventilation.  Impressions:  - Technically limited study due to poor echo windows and  tachycardia. Consider TEE  if more accurate evaluation is  necessary.   Jan 2019 echo Study Conclusions  - Left ventricle: The cavity size was normal. Wall thickness was increased in a pattern of mild LVH. Systolic function was at the lower limits of normal. The estimated ejection fraction was 50%. Diffuse hypokinesis. The study was not technically sufficient to allow evaluation of LV diastolic dysfunction due to atrial fibrillation. Doppler parameters are consistent with high ventricular filling pressure. - Aortic valve: Trileaflet; mildly thickened leaflets. - Mitral valve: There was mild regurgitation. - Left atrium: The atrium was moderately dilated. - Right atrium: The atrium was moderately dilated.   Labs/Other Tests and Data Reviewed:    EKG:  n/a  Recent Labs: 09/12/2018: ALT 14; BUN 29; Creatinine, Ser 2.28; Hemoglobin 12.4; Platelets 206; Potassium 3.4; Sodium 138    Recent Lipid Panel Lab Results  Component Value Date/Time   CHOL 142 08/30/2017 12:05 PM   TRIG 199 (H) 08/30/2017 12:05 PM   HDL 33 (L) 08/30/2017 12:05 PM   CHOLHDL 4.3 08/30/2017 12:05 PM   LDLCALC 79 08/30/2017 12:05 PM    Wt Readings from Last 3 Encounters:  11/22/18 178 lb (80.7 kg)  09/18/18 182 lb (82.6 kg)  09/12/18 (P) 183 lb (83 kg)     Objective:    Vital Signs:   Today's Vitals   04/04/19 1253  Weight: 180 lb (81.6 kg)  Height: 6' (1.829 m)   Body mass index is 24.41 kg/m.  Normal affect. Normal speech pattern and tone. Comfortable, no apparnet distress. No audible signs of SOB or wheezing.   ASSESSMENT & PLAN:    1. Afib -  NOACs too expensive, continue coumadin - no recent symptoms, he will continue current meds  2. CAD - denies any symptoms, conitnue currentmeds  3. HTN -reasonablke control at home, continue to monitor  4.Ischemic cardiopmyopathy -LVEF has normalized -no recent symptoms, conitnue current meds  5. Hyperlipidemia - he will continue staitn, labs followed by pcp  6. CKD IV - avoid nephrotoxic meds - request labs from [facility]  COVID-19 Education: The signs and symptoms of COVID-19 were discussed with the patient and how to seek care for testing (follow up with PCP or arrange E-visit).  The importance of social distancing was discussed today.  Time:   Today, I have spent 23 minutes with the patient with telehealth technology discussing the above problems.     Medication Adjustments/Labs and Tests Ordered: Current medicines are reviewed at length with the patient today.  Concerns regarding medicines are outlined above.   Tests Ordered: No orders of the defined types were placed in this encounter.   Medication Changes: No orders of the defined types were placed in this encounter.   Follow Up:  Either In Person or Virtual in 6 month(s)  Signed, Carlyle Dolly, MD  04/04/2019 12:49 PM    [place]

## 2019-04-04 NOTE — Patient Instructions (Signed)

## 2019-04-06 ENCOUNTER — Telehealth: Payer: Self-pay | Admitting: Family Medicine

## 2019-04-06 ENCOUNTER — Other Ambulatory Visit: Payer: Self-pay | Admitting: Family Medicine

## 2019-04-06 ENCOUNTER — Telehealth: Payer: Self-pay | Admitting: Cardiology

## 2019-04-06 MED ORDER — ALBUTEROL SULFATE HFA 108 (90 BASE) MCG/ACT IN AERS: 2.0000 | INHALATION_SPRAY | RESPIRATORY_TRACT | 0 refills | Status: DC | PRN

## 2019-04-06 NOTE — Telephone Encounter (Signed)
Pt's wife states that she called the cardiologist and found out why his bp was going up. The cardiologist started him back on the calcitrol 0.5 mg. Pt's wife states she will monitor his bp and let us know if it does not come back down.

## 2019-04-06 NOTE — Telephone Encounter (Signed)
Spoke with Vaughan Basta Cassada regarding pt. She reports that has not taken his Norvasc and BP is elevated. She has called the the pharmacy and they have faxed the PCP. BP are as follows 178/95 69 164/89 54 130/87 80 183/132 71 148/96 79 Please advise.

## 2019-04-06 NOTE — Telephone Encounter (Signed)
BP this morning 177/105  HR 68  She is concerned about it being so high this morning

## 2019-04-06 NOTE — Telephone Encounter (Signed)
Patients wife called and said she is having a hard time getting his blood pressure under control  Would love a call back today if possible  [phone]

## 2019-04-06 NOTE — Telephone Encounter (Signed)
If has not been taking norvasc then first step would be restarting and monitoring bp's from there   Zandra Abts MD

## 2019-04-06 NOTE — Telephone Encounter (Signed)
Pt.notified

## 2019-04-23 ENCOUNTER — Ambulatory Visit (INDEPENDENT_AMBULATORY_CARE_PROVIDER_SITE_OTHER): Payer: Medicare Other | Admitting: *Deleted

## 2019-04-23 ENCOUNTER — Other Ambulatory Visit: Payer: Self-pay

## 2019-04-23 DIAGNOSIS — I4891 Unspecified atrial fibrillation: Secondary | ICD-10-CM | POA: Diagnosis not present

## 2019-04-23 DIAGNOSIS — Z5181 Encounter for therapeutic drug level monitoring: Secondary | ICD-10-CM

## 2019-04-23 LAB — POCT INR: INR: 3.3 — AB (ref 2.0–3.0)

## 2019-04-23 NOTE — Patient Instructions (Signed)
Take 1/2 tablet tonight then decrease dose to 1 tablet daily except 1 1/2 tablets on Fridays Recheck in 4 wks

## 2019-05-15 ENCOUNTER — Other Ambulatory Visit: Payer: Self-pay | Admitting: Family Medicine

## 2019-05-16 ENCOUNTER — Ambulatory Visit (INDEPENDENT_AMBULATORY_CARE_PROVIDER_SITE_OTHER): Payer: Medicare Other | Admitting: *Deleted

## 2019-05-16 ENCOUNTER — Other Ambulatory Visit: Payer: Self-pay

## 2019-05-16 DIAGNOSIS — Z5181 Encounter for therapeutic drug level monitoring: Secondary | ICD-10-CM

## 2019-05-16 DIAGNOSIS — I4891 Unspecified atrial fibrillation: Secondary | ICD-10-CM

## 2019-05-16 LAB — POCT INR: INR: 1.6 — AB (ref 2.0–3.0)

## 2019-05-16 NOTE — Patient Instructions (Signed)
Take 2 tablets tonight then increase dose to 1 tablet daily except 1 1/2 tablets on Tuesdays and Fridays Recheck in 3 wks

## 2019-06-03 ENCOUNTER — Other Ambulatory Visit: Payer: Self-pay | Admitting: Family Medicine

## 2019-06-05 DIAGNOSIS — H2513 Age-related nuclear cataract, bilateral: Secondary | ICD-10-CM | POA: Diagnosis not present

## 2019-06-05 DIAGNOSIS — H524 Presbyopia: Secondary | ICD-10-CM | POA: Diagnosis not present

## 2019-06-08 ENCOUNTER — Other Ambulatory Visit: Payer: Self-pay | Admitting: Family Medicine

## 2019-06-11 MED ORDER — TAMSULOSIN HCL 0.4 MG PO CAPS: 0.4000 mg | ORAL_CAPSULE | Freq: Every day | ORAL | 0 refills | Status: DC

## 2019-06-11 NOTE — Addendum Note (Signed)
Addended by: Sheral Flow on: 06/11/2019 08:05 AM   Modules accepted: Orders

## 2019-06-19 DIAGNOSIS — H524 Presbyopia: Secondary | ICD-10-CM | POA: Diagnosis not present

## 2019-06-19 DIAGNOSIS — H2513 Age-related nuclear cataract, bilateral: Secondary | ICD-10-CM | POA: Diagnosis not present

## 2019-06-25 ENCOUNTER — Other Ambulatory Visit: Payer: Self-pay | Admitting: Family Medicine

## 2019-07-05 ENCOUNTER — Other Ambulatory Visit: Payer: Self-pay

## 2019-07-05 ENCOUNTER — Ambulatory Visit (INDEPENDENT_AMBULATORY_CARE_PROVIDER_SITE_OTHER): Payer: Medicare Other | Admitting: *Deleted

## 2019-07-05 DIAGNOSIS — I4891 Unspecified atrial fibrillation: Secondary | ICD-10-CM | POA: Diagnosis not present

## 2019-07-05 DIAGNOSIS — Z5181 Encounter for therapeutic drug level monitoring: Secondary | ICD-10-CM | POA: Diagnosis not present

## 2019-07-05 LAB — POCT INR: INR: 2.4 (ref 2.0–3.0)

## 2019-07-05 NOTE — Patient Instructions (Signed)
Continue warfarin 1 tablet daily except 1 1/2 tablets on Tuesdays and Fridays Recheck in 4 wks 

## 2019-07-06 ENCOUNTER — Other Ambulatory Visit: Payer: Self-pay | Admitting: Family Medicine

## 2019-07-09 DIAGNOSIS — N2581 Secondary hyperparathyroidism of renal origin: Secondary | ICD-10-CM | POA: Diagnosis not present

## 2019-07-09 DIAGNOSIS — N183 Chronic kidney disease, stage 3 unspecified: Secondary | ICD-10-CM | POA: Diagnosis not present

## 2019-07-09 DIAGNOSIS — D631 Anemia in chronic kidney disease: Secondary | ICD-10-CM | POA: Diagnosis not present

## 2019-07-09 DIAGNOSIS — I129 Hypertensive chronic kidney disease with stage 1 through stage 4 chronic kidney disease, or unspecified chronic kidney disease: Secondary | ICD-10-CM | POA: Diagnosis not present

## 2019-07-09 DIAGNOSIS — N179 Acute kidney failure, unspecified: Secondary | ICD-10-CM | POA: Diagnosis not present

## 2019-07-25 DIAGNOSIS — H25812 Combined forms of age-related cataract, left eye: Secondary | ICD-10-CM | POA: Diagnosis not present

## 2019-07-25 DIAGNOSIS — H5709 Other anomalies of pupillary function: Secondary | ICD-10-CM | POA: Diagnosis not present

## 2019-07-25 DIAGNOSIS — H2512 Age-related nuclear cataract, left eye: Secondary | ICD-10-CM | POA: Diagnosis not present

## 2019-08-02 ENCOUNTER — Other Ambulatory Visit: Payer: Self-pay

## 2019-08-02 ENCOUNTER — Ambulatory Visit (INDEPENDENT_AMBULATORY_CARE_PROVIDER_SITE_OTHER): Payer: Medicare Other | Admitting: *Deleted

## 2019-08-02 DIAGNOSIS — I4891 Unspecified atrial fibrillation: Secondary | ICD-10-CM | POA: Diagnosis not present

## 2019-08-02 DIAGNOSIS — Z5181 Encounter for therapeutic drug level monitoring: Secondary | ICD-10-CM

## 2019-08-02 LAB — POCT INR: INR: 2 (ref 2.0–3.0)

## 2019-08-02 NOTE — Patient Instructions (Signed)
Continue warfarin 1 tablet daily except 1 1/2 tablets on Tuesdays and Fridays Recheck in 4 wks 

## 2019-08-13 ENCOUNTER — Other Ambulatory Visit: Payer: Self-pay | Admitting: Family Medicine

## 2019-08-19 ENCOUNTER — Emergency Department (HOSPITAL_COMMUNITY): Payer: Medicare Other

## 2019-08-19 ENCOUNTER — Inpatient Hospital Stay (HOSPITAL_COMMUNITY)
Admission: EM | Admit: 2019-08-19 | Discharge: 2019-08-22 | DRG: 291 | Disposition: A | Discharge status: Home / Self Care | Payer: Medicare Other | Attending: Internal Medicine | Admitting: Internal Medicine

## 2019-08-19 ENCOUNTER — Other Ambulatory Visit: Payer: Self-pay

## 2019-08-19 DIAGNOSIS — Z720 Tobacco use: Secondary | ICD-10-CM | POA: Diagnosis not present

## 2019-08-19 DIAGNOSIS — I480 Paroxysmal atrial fibrillation: Secondary | ICD-10-CM | POA: Diagnosis present

## 2019-08-19 DIAGNOSIS — S3991XA Unspecified injury of abdomen, initial encounter: Secondary | ICD-10-CM | POA: Diagnosis not present

## 2019-08-19 DIAGNOSIS — Z800 Family history of malignant neoplasm of digestive organs: Secondary | ICD-10-CM

## 2019-08-19 DIAGNOSIS — I679 Cerebrovascular disease, unspecified: Secondary | ICD-10-CM | POA: Diagnosis not present

## 2019-08-19 DIAGNOSIS — I110 Hypertensive heart disease with heart failure: Secondary | ICD-10-CM | POA: Diagnosis not present

## 2019-08-19 DIAGNOSIS — J441 Chronic obstructive pulmonary disease with (acute) exacerbation: Secondary | ICD-10-CM | POA: Diagnosis present

## 2019-08-19 DIAGNOSIS — K219 Gastro-esophageal reflux disease without esophagitis: Secondary | ICD-10-CM | POA: Diagnosis present

## 2019-08-19 DIAGNOSIS — I2510 Atherosclerotic heart disease of native coronary artery without angina pectoris: Secondary | ICD-10-CM | POA: Diagnosis not present

## 2019-08-19 DIAGNOSIS — E782 Mixed hyperlipidemia: Secondary | ICD-10-CM | POA: Diagnosis not present

## 2019-08-19 DIAGNOSIS — R079 Chest pain, unspecified: Secondary | ICD-10-CM | POA: Diagnosis not present

## 2019-08-19 DIAGNOSIS — Z6825 Body mass index (BMI) 25.0-25.9, adult: Secondary | ICD-10-CM

## 2019-08-19 DIAGNOSIS — J449 Chronic obstructive pulmonary disease, unspecified: Secondary | ICD-10-CM

## 2019-08-19 DIAGNOSIS — J811 Chronic pulmonary edema: Secondary | ICD-10-CM

## 2019-08-19 DIAGNOSIS — Z8042 Family history of malignant neoplasm of prostate: Secondary | ICD-10-CM

## 2019-08-19 DIAGNOSIS — I4892 Unspecified atrial flutter: Secondary | ICD-10-CM | POA: Diagnosis not present

## 2019-08-19 DIAGNOSIS — R9431 Abnormal electrocardiogram [ECG] [EKG]: Secondary | ICD-10-CM | POA: Diagnosis present

## 2019-08-19 DIAGNOSIS — I482 Chronic atrial fibrillation, unspecified: Secondary | ICD-10-CM | POA: Diagnosis present

## 2019-08-19 DIAGNOSIS — I5043 Acute on chronic combined systolic (congestive) and diastolic (congestive) heart failure: Secondary | ICD-10-CM

## 2019-08-19 DIAGNOSIS — E785 Hyperlipidemia, unspecified: Secondary | ICD-10-CM | POA: Diagnosis present

## 2019-08-19 DIAGNOSIS — I5021 Acute systolic (congestive) heart failure: Secondary | ICD-10-CM | POA: Diagnosis present

## 2019-08-19 DIAGNOSIS — R52 Pain, unspecified: Secondary | ICD-10-CM

## 2019-08-19 DIAGNOSIS — I739 Peripheral vascular disease, unspecified: Secondary | ICD-10-CM | POA: Diagnosis present

## 2019-08-19 DIAGNOSIS — Z7901 Long term (current) use of anticoagulants: Secondary | ICD-10-CM

## 2019-08-19 DIAGNOSIS — R0602 Shortness of breath: Secondary | ICD-10-CM | POA: Diagnosis not present

## 2019-08-19 DIAGNOSIS — I130 Hypertensive heart and chronic kidney disease with heart failure and stage 1 through stage 4 chronic kidney disease, or unspecified chronic kidney disease: Principal | ICD-10-CM | POA: Diagnosis present

## 2019-08-19 DIAGNOSIS — I5042 Chronic combined systolic (congestive) and diastolic (congestive) heart failure: Secondary | ICD-10-CM | POA: Diagnosis present

## 2019-08-19 DIAGNOSIS — Z8249 Family history of ischemic heart disease and other diseases of the circulatory system: Secondary | ICD-10-CM | POA: Diagnosis not present

## 2019-08-19 DIAGNOSIS — I4821 Permanent atrial fibrillation: Secondary | ICD-10-CM | POA: Diagnosis present

## 2019-08-19 DIAGNOSIS — F17210 Nicotine dependence, cigarettes, uncomplicated: Secondary | ICD-10-CM | POA: Diagnosis present

## 2019-08-19 DIAGNOSIS — N179 Acute kidney failure, unspecified: Secondary | ICD-10-CM | POA: Diagnosis not present

## 2019-08-19 DIAGNOSIS — N1832 Chronic kidney disease, stage 3b: Secondary | ICD-10-CM | POA: Diagnosis present

## 2019-08-19 DIAGNOSIS — Z8679 Personal history of other diseases of the circulatory system: Secondary | ICD-10-CM | POA: Diagnosis not present

## 2019-08-19 DIAGNOSIS — I252 Old myocardial infarction: Secondary | ICD-10-CM

## 2019-08-19 DIAGNOSIS — Z8547 Personal history of malignant neoplasm of testis: Secondary | ICD-10-CM

## 2019-08-19 DIAGNOSIS — Z20822 Contact with and (suspected) exposure to covid-19: Secondary | ICD-10-CM | POA: Diagnosis not present

## 2019-08-19 DIAGNOSIS — S3993XA Unspecified injury of pelvis, initial encounter: Secondary | ICD-10-CM | POA: Diagnosis not present

## 2019-08-19 DIAGNOSIS — T501X5A Adverse effect of loop [high-ceiling] diuretics, initial encounter: Secondary | ICD-10-CM | POA: Diagnosis not present

## 2019-08-19 DIAGNOSIS — I5023 Acute on chronic systolic (congestive) heart failure: Secondary | ICD-10-CM | POA: Diagnosis not present

## 2019-08-19 DIAGNOSIS — I10 Essential (primary) hypertension: Secondary | ICD-10-CM | POA: Diagnosis present

## 2019-08-19 DIAGNOSIS — Z9861 Coronary angioplasty status: Secondary | ICD-10-CM

## 2019-08-19 DIAGNOSIS — Z79899 Other long term (current) drug therapy: Secondary | ICD-10-CM

## 2019-08-19 LAB — CBC WITH DIFFERENTIAL/PLATELET
Abs Immature Granulocytes: 0.05 10*3/uL (ref 0.00–0.07)
Basophils Absolute: 0 10*3/uL (ref 0.0–0.1)
Basophils Relative: 0 %
Eosinophils Absolute: 0.1 10*3/uL (ref 0.0–0.5)
Eosinophils Relative: 1 %
HCT: 37.8 % — ABNORMAL LOW (ref 39.0–52.0)
Hemoglobin: 12.6 g/dL — ABNORMAL LOW (ref 13.0–17.0)
Immature Granulocytes: 1 %
Lymphocytes Relative: 14 %
Lymphs Abs: 1.3 10*3/uL (ref 0.7–4.0)
MCH: 31.4 pg (ref 26.0–34.0)
MCHC: 33.3 g/dL (ref 30.0–36.0)
MCV: 94.3 fL (ref 80.0–100.0)
Monocytes Absolute: 0.9 10*3/uL (ref 0.1–1.0)
Monocytes Relative: 10 %
Neutro Abs: 6.7 10*3/uL (ref 1.7–7.7)
Neutrophils Relative %: 74 %
Platelets: 154 10*3/uL (ref 150–400)
RBC: 4.01 MIL/uL — ABNORMAL LOW (ref 4.22–5.81)
RDW: 13.8 % (ref 11.5–15.5)
WBC: 9 10*3/uL (ref 4.0–10.5)
nRBC: 0 % (ref 0.0–0.2)

## 2019-08-19 LAB — BASIC METABOLIC PANEL
Anion gap: 11 (ref 5–15)
BUN: 25 mg/dL — ABNORMAL HIGH (ref 8–23)
CO2: 21 mmol/L — ABNORMAL LOW (ref 22–32)
Calcium: 9.2 mg/dL (ref 8.9–10.3)
Chloride: 106 mmol/L (ref 98–111)
Creatinine, Ser: 2.48 mg/dL — ABNORMAL HIGH (ref 0.61–1.24)
GFR calc Af Amer: 30 mL/min — ABNORMAL LOW (ref >60)
GFR calc non Af Amer: 26 mL/min — ABNORMAL LOW (ref >60)
Glucose, Bld: 112 mg/dL — ABNORMAL HIGH (ref 70–99)
Potassium: 4.6 mmol/L (ref 3.5–5.1)
Sodium: 138 mmol/L (ref 135–145)

## 2019-08-19 LAB — PROTIME-INR
INR: 2.5 — ABNORMAL HIGH (ref 0.8–1.2)
Prothrombin Time: 26.8 seconds — ABNORMAL HIGH (ref 11.4–15.2)

## 2019-08-19 LAB — HEPATIC FUNCTION PANEL
ALT: 16 U/L (ref 0–44)
AST: 15 U/L (ref 15–41)
Albumin: 3.5 g/dL (ref 3.5–5.0)
Alkaline Phosphatase: 78 U/L (ref 38–126)
Bilirubin, Direct: 0.1 mg/dL (ref 0.0–0.2)
Indirect Bilirubin: 0.5 mg/dL (ref 0.3–0.9)
Total Bilirubin: 0.6 mg/dL (ref 0.3–1.2)
Total Protein: 7.2 g/dL (ref 6.5–8.1)

## 2019-08-19 LAB — BRAIN NATRIURETIC PEPTIDE: B Natriuretic Peptide: 532 pg/mL — ABNORMAL HIGH (ref 0.0–100.0)

## 2019-08-19 LAB — TROPONIN I (HIGH SENSITIVITY): Troponin I (High Sensitivity): 10 ng/L (ref <18)

## 2019-08-19 MED ORDER — ALBUTEROL SULFATE HFA 108 (90 BASE) MCG/ACT IN AERS
4.0000 | INHALATION_SPRAY | Freq: Once | RESPIRATORY_TRACT | Status: AC
  Administered 2019-08-19: 4 via RESPIRATORY_TRACT
  Filled 2019-08-19: qty 6.7

## 2019-08-19 MED ORDER — AEROCHAMBER PLUS FLO-VU MEDIUM MISC
1.0000 | Freq: Once | Status: AC
  Administered 2019-08-19: 1
  Filled 2019-08-19 (×2): qty 1

## 2019-08-19 NOTE — ED Notes (Signed)
Pt to CT

## 2019-08-19 NOTE — ED Triage Notes (Signed)
Pt reports fall ing onto "an old sewing machine"  Onto his R ribs

## 2019-08-19 NOTE — ED Notes (Signed)
Pt very short of breath upon walking back to room. States it was "unusal for him".

## 2019-08-19 NOTE — ED Provider Notes (Signed)
[facility] EMERGENCY DEPARTMENT Provider Note   CSN: [id number] Arrival date & time: 08/19/19  2028     History Chief Complaint  Patient presents with  . Shortness of Breath    [name] [name] is a 69 y.o. male with a past medical history of permanent A. fib anticoagulated with warfarin, CKD 4, hypertension, hyperlipidemia, MI, PVD, AAA status post repair, who presents today for evaluation of shortness of breath. He reports that 3 days ago he was walking with his walker and had a mechanical nonsyncopal fall onto a old sewing machine landing on the right sided chest laterally.  He reports that since then he has had increasing shortness of breath.  He also notes he has had leg swelling.  He reports compliance with his medications.  He denies any fevers.  No increased sputum production.  He denies any vomiting or diarrhea.  He states he last had albuterol about 3 hours ago.  He does not have any pain in the left side of his chest.  Has not been vaccinated against Covid and has not previously had Covid.    He denies striking his head when he fell.  No pain in his neck.  He reports that his symptoms have been worsening.    HPI     Past Medical History:  Diagnosis Date  . AAA (abdominal aortic aneurysm) ([place])    a. s/p repair 06/2015 with left renal artery bypass at that time, post-op course c/b renal failure requiring dialysis, C dif.  . Anemia   . Arteriosclerotic cardiovascular disease (ASCVD)    a. AMI in 2000 treated at [facility]. b. cath in 12/2006->  Chronic total obstruction of the RCA;  drug-eluting stent placed in OM1, LVEF abnormal.  . Cerebrovascular disease 2002   carotid stent  . Chronic anticoagulation   . Chronic combined systolic and diastolic CHF (congestive heart failure) ([place])   . CKD (chronic kidney disease), stage IV ([place])   . GERD (gastroesophageal reflux disease)   . Hyperlipidemia   . Hypertension   . Myocardial infarction ([place]) 10 yrs ago  . Nephrolithiasis     . Permanent atrial fibrillation ([place])   . PVD (peripheral vascular disease) ([place])    Ct angiogram in 2009 revealed stable disease with 80% celiac stenosis,50% right renal artery ,ASCVD with ulceration in the abdominal aortashe  . Testicular carcinoma ([place]) 1990   right orchiectomy  . Tobacco abuse, in remission    20 pack years; quit in 2009    Patient Active Problem List   Diagnosis Date Noted  . Follow-up examination following surgery 09/28/2018  . Calculus of gallbladder with acute cholecystitis without obstruction   . Atrial fibrillation with RVR ([place])   . Acute on chronic combined systolic and diastolic CHF (congestive heart failure) ([place]) 06/19/2015  . Status post abdominal aortic aneurysm (AAA) repair   . History of CVA with residual deficit   . Pseudoaneurysm of aorta ([place]) 06/09/2015  . CAD - s/p PCI to Cx. 100% RCA CTO 05/28/2015  . Hiatal hernia   . History of colonic polyps   . Hx of adenomatous colonic polyps 05/14/2015  . Constipation 04/22/2015  . Insomnia 07/16/2014  . ED (erectile dysfunction) 06/17/2013  . Chronic kidney disease 09/28/2011  . GERD (gastroesophageal reflux disease) 06/01/2011  . Family history of colon cancer 05/20/2011  . Chronic anticoagulation 09/24/2010  . Tobacco abuse   . ATHEROSCLEROTIC CARDIOVASCULAR DISEASE 11/13/2009  . PERIPHERAL VASCULAR DISEASE 11/13/2009  . Hyperlipidemia 10/24/2008  .  Testicular cancer ([place]) 10/22/2008  . Essential hypertension 10/22/2008    Past Surgical History:  Procedure Laterality Date  . ABDOMINAL AORTIC ANEURYSM REPAIR N/A 06/09/2015   Procedure: ANEURYSM ABDOMINAL AORTIC REPAIR;  Surgeon: Elam Dutch, MD;  [facility];  Service: Vascular;  Laterality: N/A;  . AORTIC ENDARTERECETOMY N/A 06/09/2015   Procedure: AORTIC ENDARTERECETOMY;  Surgeon: Elam Dutch, MD;  Location: [place];  Service: Vascular;  Laterality: N/A;  . AORTIC/RENAL BYPASS Left 06/09/2015   Procedure: LEFT RENAL Artery BYPASS;   Surgeon: Elam Dutch, MD;  Location: [place];  Service: Vascular;  Laterality: Left;  . APPENDECTOMY  2004  . CHOLECYSTECTOMY N/A 09/18/2018   Procedure: LAPAROSCOPIC CHOLECYSTECTOMY;  Surgeon: Aviva Signs, MD;  Location: [facility] ORS;  Service: General;  Laterality: N/A;  . COLONOSCOPY  06/17/2011   INCOMPLETE, PREP POOR. Procedure: COLONOSCOPY;  Surgeon: Daneil Dolin, MD;  Location[facility] ENDO [facility];  Service: Endoscopy;  Laterality: N/A;  10:00  . COLONOSCOPY  07/15/2011   [id number] rectal and colon polyps  . COLONOSCOPY N/A 05/22/2015   [id number] colonic and rectal polyps. tubular adenomas.repeat TCS 05/2018  . ESOPHAGEAL DILATION N/A 05/22/2015   Procedure: ESOPHAGEAL DILATION;  Surgeon: Daneil Dolin, MD;  [facility];  Service: Endoscopy;  Laterality: N/A;  . ESOPHAGOGASTRODUODENOSCOPY  06/17/2011   severe erosive/ulcerative reflux esophagitis, soft noncritical stricture dilatied, small hh, antral erosion   . ESOPHAGOGASTRODUODENOSCOPY N/A 05/22/2015   RMR: 2 cm HH otherwise normal. s/p empirical dilation.  . INSERTION OF DIALYSIS CATHETER Left 06/26/2015   Procedure: INSERTION OF DIALYSIS CATHETER;  Surgeon: Angelia Mould, MD;  Location: [place];  Service: Vascular;  Laterality: Left;  . PERIPHERAL VASCULAR CATHETERIZATION N/A 05/28/2015   Procedure: Abdominal Aortogram;  Surgeon: Serafina Mitchell, MD;  Location: [place] CV LAB;  Service: Cardiovascular;  Laterality: N/A;  . testicular cancer  1990   right orchiectomy       Family History  Problem Relation Age of Onset  . Colon cancer Father 56       deceased  . Prostate cancer Father   . Heart disease Mother   . Liver disease Neg Hx   . Anesthesia problems Neg Hx   . Hypotension Neg Hx   . Malignant hyperthermia Neg Hx   . Pseudochol deficiency Neg Hx     Social History   Tobacco Use  . Smoking status: Current Some Day Smoker    Packs/day: 0.50    Years: 40.00    Pack years: 20.00    Types:  Cigarettes  . Smokeless tobacco: Never Used  . Tobacco comment: 1/2 pack daily  Substance Use Topics  . Alcohol use: No    Alcohol/week: 0.0 standard drinks  . Drug use: No    Home Medications Prior to Admission medications   Medication Sig Start Date End Date Taking? Authorizing Provider  albuterol (VENTOLIN HFA) 108 (90 Base) MCG/ACT inhaler Inhale 2-4 puffs into the lungs every 4 (four) hours as needed for wheezing or shortness of breath. 04/06/19   Alycia Rossetti, MD  amLODipine (NORVASC) 10 MG tablet Take 1 tablet by mouth once daily 07/06/19   Alycia Rossetti, MD  calcitRIOL (ROCALTROL) 0.5 MCG capsule Take 0.5 mcg by mouth daily.    [provider]  Ferrous Sulfate (IRON) 325 (65 Fe) MG TABS TAKE 1 TABLET BY MOUTH THREE TIMES DAILY WITH MEALS 08/13/19   [place], Modena Nunnery, MD  fluticasone [facility]) 50 MCG/ACT  nasal spray Place 1 spray into both nostrils daily as needed for allergies or rhinitis.    [provider]  furosemide (LASIX) 40 MG tablet Take 1 tablet (40 mg total) by mouth every other day. Take 1 tablet by mouth once daily Patient taking differently: Take 20-40 mg by mouth See admin instructions. Take 40 mg by mouth every other day, alternating with 20 mg 07/21/18   Arnoldo Lenis, MD  hydrALAZINE (APRESOLINE) 50 MG tablet Take 1 tablet (50 mg total) by mouth 3 (three) times daily. 11/22/18 04/04/19  Arnoldo Lenis, MD  metoprolol succinate (TOPROL-XL) 100 MG 24 hr tablet TAKE 1 & 1/2 (ONE & ONE-HALF) TABLETS BY MOUTH ONCE DAILY 02/06/19   Arnoldo Lenis, MD  nitroGLYCERIN (NITROSTAT) 0.4 MG SL tablet place 1 tablet under the tongue if needed every 5 minutes for chest pain for 3 doses IF NO RELIEF AFTER 3RD DOSE CALL PRESCRIBER OR 911. Patient taking differently: Place 0.4 mg under the tongue every 5 (five) minutes as needed for chest pain. place 1 tablet under the tongue if needed every 5 minutes for chest pain for 3 doses IF NO RELIEF AFTER 3RD DOSE  CALL PRESCRIBER OR 911. 10/25/16   Lendon Colonel, NP  ondansetron (ZOFRAN) 4 MG tablet Take 1 tablet (4 mg total) by mouth every 8 (eight) hours as needed for nausea or vomiting. 08/28/18   Alycia Rossetti, MD  oxyCODONE-acetaminophen (PERCOCET) 7.5-325 MG tablet Take 1 tablet by mouth every 6 (six) hours as needed. 09/18/18 09/18/19  Aviva Signs, MD  pantoprazole (PROTONIX) 40 MG tablet Take 1 tablet by mouth once daily 06/25/19   Alycia Rossetti, MD  tamsulosin (FLOMAX) 0.4 MG CAPS capsule Take 1 capsule (0.4 mg total) by mouth daily after supper. 06/11/19   Alycia Rossetti, MD  warfarin (COUMADIN) 5 MG tablet Take 1 tablet daily except 1 1/2 tablets on Tuesdays and Fridays 04/02/19   Arnoldo Lenis, MD    Allergies    Patient has no known allergies.  Review of Systems   Review of Systems  Constitutional: Negative for chills and fever.  HENT: Negative for congestion.   Eyes: Negative for visual disturbance.  Respiratory: Positive for cough and shortness of breath.   Cardiovascular: Positive for chest pain and leg swelling. Negative for palpitations.  Gastrointestinal: Negative for abdominal pain, diarrhea, nausea and vomiting.  Genitourinary: Negative for dysuria.  Musculoskeletal: Negative for back pain and neck pain.  Skin: Negative for color change, rash and wound.  Neurological: Negative for weakness and headaches.  Psychiatric/Behavioral: Negative for confusion.  All other systems reviewed and are negative.   Physical Exam Updated Vital Signs BP (!) 156/91   Pulse 71   Temp 98 F (36.7 C) (Oral)   Resp (!) 30   Ht 6' (1.829 m)   Wt 84.8 kg   SpO2 95%   BMI 25.36 kg/m   Physical Exam Vitals and nursing note reviewed.  Constitutional:      Appearance: He is well-developed. He is obese. He is ill-appearing.  HENT:     Head: Normocephalic and atraumatic.     Mouth/Throat:     Mouth: Mucous membranes are moist.  Eyes:     Conjunctiva/sclera: Conjunctivae  normal.     Pupils: Pupils are equal, round, and reactive to light.  Cardiovascular:     Rate and Rhythm: Normal rate. Rhythm irregular.     Heart sounds: No murmur.  Pulmonary:  Effort: Tachypnea, accessory muscle usage and respiratory distress (Mild) present.     Breath sounds: Examination of the right-upper field reveals wheezing and rhonchi. Examination of the left-upper field reveals wheezing and rhonchi. Examination of the right-middle field reveals wheezing and rhonchi. Examination of the left-middle field reveals wheezing and rhonchi. Examination of the right-lower field reveals wheezing and rhonchi. Examination of the left-lower field reveals wheezing and rhonchi. Wheezing and rhonchi present.  Chest:     Chest wall: Tenderness (Mild, right lateral chest) present. No deformity.  Abdominal:     Palpations: Abdomen is soft.     Tenderness: There is abdominal tenderness (Right lateral abdomen).  Musculoskeletal:     Cervical back: Normal range of motion and neck supple.     Right lower leg: Edema present.     Left lower leg: Edema present.     Comments: 2+ pitting edema bilaterally.   Skin:    General: Skin is warm and dry.  Neurological:     General: No focal deficit present.     Mental Status: He is alert.     Cranial Nerves: No cranial nerve deficit.  Psychiatric:        Mood and Affect: Mood normal.        Behavior: Behavior normal.     ED Results / Procedures / Treatments   Labs (all labs ordered are listed, but only abnormal results are displayed) Labs Reviewed  CBC WITH DIFFERENTIAL/PLATELET - Abnormal; Notable for the following components:      Result Value   RBC 4.01 (*)    Hemoglobin 12.6 (*)    HCT 37.8 (*)    All other components within normal limits  BASIC METABOLIC PANEL - Abnormal; Notable for the following components:   CO2 21 (*)    Glucose, Bld 112 (*)    BUN 25 (*)    Creatinine, Ser 2.48 (*)    GFR calc non Af Amer 26 (*)    GFR calc Af Amer  30 (*)    All other components within normal limits  BRAIN NATRIURETIC PEPTIDE - Abnormal; Notable for the following components:   B Natriuretic Peptide 532.0 (*)    All other components within normal limits  PROTIME-INR - Abnormal; Notable for the following components:   Prothrombin Time 26.8 (*)    INR 2.5 (*)    All other components within normal limits  RESPIRATORY PANEL BY RT PCR (FLU A&B, COVID)  HEPATIC FUNCTION PANEL  TROPONIN I (HIGH SENSITIVITY)  TROPONIN I (HIGH SENSITIVITY)    EKG EKG Interpretation  Date/Time:  Sunday August 19 2019 22:25:45 EDT Ventricular Rate:  94 PR Interval:    QRS Duration: 108 QT Interval:  413 QTC Calculation: 517 R Axis:   -39 Text Interpretation: Atrial flutter Ventricular premature complex Probable anterior infarct, old Prolonged QT interval Baseline wander in lead(s) II Confirmed by Orpah Greek [id number]) on 08/19/2019 11:08:10 PM   Radiology CT ABDOMEN PELVIS WO CONTRAST  Result Date: 08/20/2019 CLINICAL DATA:  Right chest pain after falling against a sewing machine 4 days prior small BB marker at the site of discomfort EXAM: CT CHEST, ABDOMEN AND PELVIS WITHOUT CONTRAST TECHNIQUE: Multidetector CT imaging of the chest, abdomen and pelvis was performed following the standard protocol without IV contrast. COMPARISON:  CT abdomen pelvis 07/11/2015, 09/15/2015, 07/15/2019, same day chest radiograph FINDINGS: CT CHEST FINDINGS Cardiovascular: There is cardiomegaly with four-chamber enlargement. Three-vessel coronary artery disease is present. No pericardial effusion. Atherosclerotic plaque within  the normal caliber aorta. Normal 3 vessel branching of the aortic arch with moderate calcification of the proximal great vessels which are otherwise unremarkable. Central pulmonary arteries are normal caliber. Luminal evaluation precluded in the absence of contrast media. Mediastinum/Nodes: No mediastinal fluid or gas. Normal thyroid gland and  thoracic inlet. No acute abnormality of the trachea. Small hiatal hernia. No other esophageal abnormality. No worrisome mediastinal or axillary adenopathy. Hilar nodal evaluation is limited in the absence of intravenous contrast media. Lungs/Pleura: There are small bilateral pleural effusions with some adjacent mild atelectatic change. There is mild biapical pleuroparenchymal scarring. Some interlobular septal thickening is noted towards the apices and lung bases. No suspicious pulmonary nodules or masses. Few bandlike opacities towards the lung bases likely reflect atelectasis and/or scarring. Mild hyperinflation of the lungs with flattening of the diaphragms and diffuse mild airways thickening with scattered secretions likely reflecting features of chronic bronchitis. Musculoskeletal: No visible displaced rib fracture is seen at the indicated area by the radiopaque BB marker or elsewhere within the chest. No other acute osseous abnormality or suspicious osseous lesion.There is mild dextrocurvature of the thoracic spine. Slight straightening of the normal thoracic kyphosis. Multilevel degenerative changes are present in the imaged portions of the spine. Mild bilateral gynecomastia. Small probable dermal inclusion cyst along the right chest wall (2/56). CT ABDOMEN PELVIS FINDINGS Hepatobiliary: No visible direct hepatic injury or perihepatic hematoma. No focal liver abnormality is seen. Patient is post cholecystectomy. Slight prominence of the biliary tree likely related to reservoir effect. No calcified intraductal gallstones. Pancreas: Unremarkable. No pancreatic ductal dilatation or surrounding inflammatory changes. Spleen: No direct splenic injury or perisplenic hematoma. Normal splenic size. No concerning splenic lesions. Evaluation of spleen is significantly limited by extensive motion artifact. Adrenals/Urinary Tract: Lobular thickening of the left adrenal gland with a stable 1.6 cm nodule in the medial limb  of the right adrenal gland which is not significantly changed from comparison studies, most compatible with an adrenal adenoma. There is asymmetric right renal atrophy likely secondary to the severe stenosis at the right renal artery ostium. There is extensive retroperitoneal stranding which is similar to comparison CT 07/15/2019 though does reflect interval change from more remote comparison exams. Unlikely to reflect an acute traumatic injury. Retroperitoneal stranding does extend along the left ureter. Few calcifications in the renal sinus could reflect nonobstructing calculus or vascular calcium. No obstructive urolithiasis or hydronephrosis is seen in either kidney. No visible or contour deforming renal lesion. Question some mild bladder wall thickening. Stomach/Bowel: Small hiatal hernia, apparent gastric wall thickening may be related to underdistention. Duodenal sweep takes a normal course across the mid abdomen the. There are several possibly thickened loops of bowel in the left upper quadrant with surrounding edematous changes of the adjacent mesentery which is similar to slightly increased from comparison exam March 2020 though incompletely assessed in the absence of intravenous or luminal contrast. No resulting obstruction is evident. No distal small bowel dilatation or wall thickening. Postsurgical changes near the cecal tip may reflect sequela of prior appendectomy. No colonic wall thickening or dilatation. Vascular/Lymphatic: Extensive atherosclerotic calcification throughout the abdominal aorta and branch vessels including previously described critical stenosis of the right renal artery. There is a saccular 2.7 cm aneurysm arising from the 4 o'clock position of the aorta just below the origin of the renal arteries. This is unchanged in size from comparison study 07/15/2018. Further vascular evaluation is limited in the absence of intravenous contrast media. The some reactive adenopathy noted in  the  mesentery and left Reproductive: Mild stranding upon the prostate and seminal vesicles, nonspecific. Other: Edematous changes in the left retroperitoneum and left mesentery, as detailed above. No free air or free fluid. No organized collection or abscess. Soft tissue thickening along the anterior abdomen is nonspecific, could reflect a cellulitis. Edematous changes noted posteriorly. Musculoskeletal: Multilevel degenerative changes are present in the imaged portions of the spine. No acute osseous abnormality or suspicious osseous lesion. Unchanged inferior endplate deformity L5. The osseous structures appear diffusely demineralized which may limit detection of small or nondisplaced fractures. No acute osseous abnormality or suspicious osseous lesion. IMPRESSION: 1. No convincing acute traumatic abnormality of the right chest wall are elsewhere in the chest abdomen or pelvis. 2. Focal left abdominal proximal small bowel wall thickening and mesenteric stranding as well as diffuse edematous changes in the left retroperitoneum about the kidney and left ureter is similar to slightly increased from the comparison CT 07/15/2019 though does reflect a significant interval change from more remote comparison exams. Unlikely to reflect an acute traumatic injury. Consider correlation with lab values, urinalysis and possible reimaging with enteric luminal contrast to further assess the bowel wall. 3. Cardiomegaly with four-chamber enlargement. Three-vessel coronary artery disease. Additional features heart failure and volume overload with small bilateral pleural effusions with some adjacent mild atelectatic change. Some interlobular septal thickening towards the apices and lung bases is suggestive of pulmonary edema. 4. Stable 1.6 cm right adrenal adenoma. 5. Stable 2.7 cm saccular aneurysm arising from the 4 o'clock position of the aorta just below the origin of the renal arteries. This is unchanged in size from comparison study  07/15/2018. 6. Mild stranding about the prostate, correlate for features of prostatitis. 7. Aortic Atherosclerosis (ICD10-I70.0). These results were called by telephone at the time of interpretation on 08/20/2019 at 12:24 am to provider [facility] , who verbally acknowledged these results. Electronically Signed   By: Lovena Le M.D.   On: 08/20/2019 00:25   DG Chest 2 View  Result Date: 08/19/2019 CLINICAL DATA:  Shortness of breath EXAM: CHEST - 2 VIEW COMPARISON:  07/15/2018 FINDINGS: Cardiomegaly. Mild hyperinflation of the lungs. No confluent opacities, effusions or edema. No acute bony abnormality. IMPRESSION: Cardiomegaly, hyperinflation.  No active disease. Electronically Signed   By: Rolm Baptise M.D.   On: 08/19/2019 21:52   CT Chest Wo Contrast  Result Date: 08/20/2019 CLINICAL DATA:  Right chest pain after falling against a sewing machine 4 days prior small BB marker at the site of discomfort EXAM: CT CHEST, ABDOMEN AND PELVIS WITHOUT CONTRAST TECHNIQUE: Multidetector CT imaging of the chest, abdomen and pelvis was performed following the standard protocol without IV contrast. COMPARISON:  CT abdomen pelvis 07/11/2015, 09/15/2015, 07/15/2019, same day chest radiograph FINDINGS: CT CHEST FINDINGS Cardiovascular: There is cardiomegaly with four-chamber enlargement. Three-vessel coronary artery disease is present. No pericardial effusion. Atherosclerotic plaque within the normal caliber aorta. Normal 3 vessel branching of the aortic arch with moderate calcification of the proximal great vessels which are otherwise unremarkable. Central pulmonary arteries are normal caliber. Luminal evaluation precluded in the absence of contrast media. Mediastinum/Nodes: No mediastinal fluid or gas. Normal thyroid gland and thoracic inlet. No acute abnormality of the trachea. Small hiatal hernia. No other esophageal abnormality. No worrisome mediastinal or axillary adenopathy. Hilar nodal evaluation is  limited in the absence of intravenous contrast media. Lungs/Pleura: There are small bilateral pleural effusions with some adjacent mild atelectatic change. There is mild biapical pleuroparenchymal scarring. Some interlobular septal  thickening is noted towards the apices and lung bases. No suspicious pulmonary nodules or masses. Few bandlike opacities towards the lung bases likely reflect atelectasis and/or scarring. Mild hyperinflation of the lungs with flattening of the diaphragms and diffuse mild airways thickening with scattered secretions likely reflecting features of chronic bronchitis. Musculoskeletal: No visible displaced rib fracture is seen at the indicated area by the radiopaque BB marker or elsewhere within the chest. No other acute osseous abnormality or suspicious osseous lesion.There is mild dextrocurvature of the thoracic spine. Slight straightening of the normal thoracic kyphosis. Multilevel degenerative changes are present in the imaged portions of the spine. Mild bilateral gynecomastia. Small probable dermal inclusion cyst along the right chest wall (2/56). CT ABDOMEN PELVIS FINDINGS Hepatobiliary: No visible direct hepatic injury or perihepatic hematoma. No focal liver abnormality is seen. Patient is post cholecystectomy. Slight prominence of the biliary tree likely related to reservoir effect. No calcified intraductal gallstones. Pancreas: Unremarkable. No pancreatic ductal dilatation or surrounding inflammatory changes. Spleen: No direct splenic injury or perisplenic hematoma. Normal splenic size. No concerning splenic lesions. Evaluation of spleen is significantly limited by extensive motion artifact. Adrenals/Urinary Tract: Lobular thickening of the left adrenal gland with a stable 1.6 cm nodule in the medial limb of the right adrenal gland which is not significantly changed from comparison studies, most compatible with an adrenal adenoma. There is asymmetric right renal atrophy likely  secondary to the severe stenosis at the right renal artery ostium. There is extensive retroperitoneal stranding which is similar to comparison CT 07/15/2019 though does reflect interval change from more remote comparison exams. Unlikely to reflect an acute traumatic injury. Retroperitoneal stranding does extend along the left ureter. Few calcifications in the renal sinus could reflect nonobstructing calculus or vascular calcium. No obstructive urolithiasis or hydronephrosis is seen in either kidney. No visible or contour deforming renal lesion. Question some mild bladder wall thickening. Stomach/Bowel: Small hiatal hernia, apparent gastric wall thickening may be related to underdistention. Duodenal sweep takes a normal course across the mid abdomen the. There are several possibly thickened loops of bowel in the left upper quadrant with surrounding edematous changes of the adjacent mesentery which is similar to slightly increased from comparison exam March 2020 though incompletely assessed in the absence of intravenous or luminal contrast. No resulting obstruction is evident. No distal small bowel dilatation or wall thickening. Postsurgical changes near the cecal tip may reflect sequela of prior appendectomy. No colonic wall thickening or dilatation. Vascular/Lymphatic: Extensive atherosclerotic calcification throughout the abdominal aorta and branch vessels including previously described critical stenosis of the right renal artery. There is a saccular 2.7 cm aneurysm arising from the 4 o'clock position of the aorta just below the origin of the renal arteries. This is unchanged in size from comparison study 07/15/2018. Further vascular evaluation is limited in the absence of intravenous contrast media. The some reactive adenopathy noted in the mesentery and left Reproductive: Mild stranding upon the prostate and seminal vesicles, nonspecific. Other: Edematous changes in the left retroperitoneum and left mesentery, as  detailed above. No free air or free fluid. No organized collection or abscess. Soft tissue thickening along the anterior abdomen is nonspecific, could reflect a cellulitis. Edematous changes noted posteriorly. Musculoskeletal: Multilevel degenerative changes are present in the imaged portions of the spine. No acute osseous abnormality or suspicious osseous lesion. Unchanged inferior endplate deformity L5. The osseous structures appear diffusely demineralized which may limit detection of small or nondisplaced fractures. No acute osseous abnormality or suspicious osseous lesion.  IMPRESSION: 1. No convincing acute traumatic abnormality of the right chest wall are elsewhere in the chest abdomen or pelvis. 2. Focal left abdominal proximal small bowel wall thickening and mesenteric stranding as well as diffuse edematous changes in the left retroperitoneum about the kidney and left ureter is similar to slightly increased from the comparison CT 07/15/2019 though does reflect a significant interval change from more remote comparison exams. Unlikely to reflect an acute traumatic injury. Consider correlation with lab values, urinalysis and possible reimaging with enteric luminal contrast to further assess the bowel wall. 3. Cardiomegaly with four-chamber enlargement. Three-vessel coronary artery disease. Additional features heart failure and volume overload with small bilateral pleural effusions with some adjacent mild atelectatic change. Some interlobular septal thickening towards the apices and lung bases is suggestive of pulmonary edema. 4. Stable 1.6 cm right adrenal adenoma. 5. Stable 2.7 cm saccular aneurysm arising from the 4 o'clock position of the aorta just below the origin of the renal arteries. This is unchanged in size from comparison study 07/15/2018. 6. Mild stranding about the prostate, correlate for features of prostatitis. 7. Aortic Atherosclerosis (ICD10-I70.0). These results were called by telephone at the  time of interpretation on 08/20/2019 at 12:24 am to provider [facility] , who verbally acknowledged these results. Electronically Signed   By: Lovena Le M.D.   On: 08/20/2019 00:25    Procedures Procedures (including critical care time)  Medications Ordered in ED Medications  albuterol (VENTOLIN HFA) 108 (90 Base) MCG/ACT inhaler 4 puff (4 puffs Inhalation Given 08/19/19 2341)  AeroChamber Plus Flo-Vu Medium MISC 1 each (1 each Other Given 08/19/19 2341)  furosemide (LASIX) injection 40 mg (40 mg Intravenous Given 08/20/19 0051)    ED Course  I have reviewed the triage vital signs and the nursing notes.  Pertinent labs & imaging results that were available during my care of the patient were reviewed by me and considered in my medical decision making (see chart for details).    MDM Rules/Calculators/A&P                     Patient is a 69 year old man who presents today for evaluation of shortness of breath.  His symptoms started after he fell striking the right side of his chest on a antique sewing machine about 3 days prior to arrival.  Here he is tachypneic with increased respiratory effort and in mild respiratory distress.  He is anticoagulated with warfarin for chronic A. fib.  CBC with mild anemia however no significant leukocytosis.  BMP appears consistent with his baseline with elevated creatinine at 2.48.  BNP is elevated at 532.  PT/INR is 2.5.  Covid testing is sent.  He was placed on 2 L of oxygen for comfort.  CT chest, abdomen, and pelvis was obtained without contrast given his renal function without evidence of rib fracture or clear acute traumatic injury.  There are abnormalities of the intestines as documented, however question if this is incidental.  He is given IV Lasix in the emergency room.  He is given albuterol.  At shift change care was transferred to Dr. Betsey Holiday who will follow pending studies, re-evaulate and determine disposition.      Final Clinical  Impression(s) / ED Diagnoses Final diagnoses:  Pain  COPD exacerbation ([place])  Acute on chronic combined systolic and diastolic CHF (congestive heart failure) ([place])    Rx / DC Orders ED Discharge Orders    None       Phylliss Bob,  Ree Shay, PA-C 08/20/19 0136    Orpah Greek, MD 08/20/19 [phone]

## 2019-08-19 NOTE — ED Triage Notes (Signed)
three day history of shortness of breath   Infrequent cough   Reports seasonal allergies   Takes flonase

## 2019-08-20 ENCOUNTER — Observation Stay (HOSPITAL_BASED_OUTPATIENT_CLINIC_OR_DEPARTMENT_OTHER): Payer: Medicare Other

## 2019-08-20 ENCOUNTER — Encounter (HOSPITAL_COMMUNITY): Payer: Self-pay | Admitting: Internal Medicine

## 2019-08-20 DIAGNOSIS — Z20822 Contact with and (suspected) exposure to covid-19: Secondary | ICD-10-CM | POA: Diagnosis present

## 2019-08-20 DIAGNOSIS — J449 Chronic obstructive pulmonary disease, unspecified: Secondary | ICD-10-CM

## 2019-08-20 DIAGNOSIS — Z720 Tobacco use: Secondary | ICD-10-CM | POA: Diagnosis not present

## 2019-08-20 DIAGNOSIS — Z7901 Long term (current) use of anticoagulants: Secondary | ICD-10-CM | POA: Diagnosis not present

## 2019-08-20 DIAGNOSIS — I5023 Acute on chronic systolic (congestive) heart failure: Secondary | ICD-10-CM | POA: Diagnosis not present

## 2019-08-20 DIAGNOSIS — R9431 Abnormal electrocardiogram [ECG] [EKG]: Secondary | ICD-10-CM | POA: Diagnosis present

## 2019-08-20 DIAGNOSIS — I482 Chronic atrial fibrillation, unspecified: Secondary | ICD-10-CM | POA: Diagnosis present

## 2019-08-20 DIAGNOSIS — R05 Cough: Secondary | ICD-10-CM | POA: Diagnosis not present

## 2019-08-20 DIAGNOSIS — J441 Chronic obstructive pulmonary disease with (acute) exacerbation: Secondary | ICD-10-CM | POA: Diagnosis not present

## 2019-08-20 DIAGNOSIS — Z8679 Personal history of other diseases of the circulatory system: Secondary | ICD-10-CM | POA: Diagnosis not present

## 2019-08-20 DIAGNOSIS — F17210 Nicotine dependence, cigarettes, uncomplicated: Secondary | ICD-10-CM | POA: Diagnosis present

## 2019-08-20 DIAGNOSIS — I5042 Chronic combined systolic (congestive) and diastolic (congestive) heart failure: Secondary | ICD-10-CM | POA: Diagnosis present

## 2019-08-20 DIAGNOSIS — R0602 Shortness of breath: Secondary | ICD-10-CM | POA: Diagnosis present

## 2019-08-20 DIAGNOSIS — I4892 Unspecified atrial flutter: Secondary | ICD-10-CM | POA: Diagnosis present

## 2019-08-20 DIAGNOSIS — I5043 Acute on chronic combined systolic (congestive) and diastolic (congestive) heart failure: Secondary | ICD-10-CM | POA: Diagnosis present

## 2019-08-20 DIAGNOSIS — I5021 Acute systolic (congestive) heart failure: Secondary | ICD-10-CM

## 2019-08-20 DIAGNOSIS — K219 Gastro-esophageal reflux disease without esophagitis: Secondary | ICD-10-CM | POA: Diagnosis present

## 2019-08-20 DIAGNOSIS — I739 Peripheral vascular disease, unspecified: Secondary | ICD-10-CM | POA: Diagnosis present

## 2019-08-20 DIAGNOSIS — N1832 Chronic kidney disease, stage 3b: Secondary | ICD-10-CM | POA: Diagnosis present

## 2019-08-20 DIAGNOSIS — I130 Hypertensive heart and chronic kidney disease with heart failure and stage 1 through stage 4 chronic kidney disease, or unspecified chronic kidney disease: Secondary | ICD-10-CM | POA: Diagnosis present

## 2019-08-20 DIAGNOSIS — I480 Paroxysmal atrial fibrillation: Secondary | ICD-10-CM | POA: Diagnosis not present

## 2019-08-20 DIAGNOSIS — I252 Old myocardial infarction: Secondary | ICD-10-CM | POA: Diagnosis not present

## 2019-08-20 DIAGNOSIS — E785 Hyperlipidemia, unspecified: Secondary | ICD-10-CM | POA: Diagnosis present

## 2019-08-20 DIAGNOSIS — T501X5A Adverse effect of loop [high-ceiling] diuretics, initial encounter: Secondary | ICD-10-CM | POA: Diagnosis not present

## 2019-08-20 DIAGNOSIS — N179 Acute kidney failure, unspecified: Secondary | ICD-10-CM | POA: Diagnosis not present

## 2019-08-20 DIAGNOSIS — Z8042 Family history of malignant neoplasm of prostate: Secondary | ICD-10-CM | POA: Diagnosis not present

## 2019-08-20 DIAGNOSIS — I679 Cerebrovascular disease, unspecified: Secondary | ICD-10-CM | POA: Diagnosis present

## 2019-08-20 DIAGNOSIS — E782 Mixed hyperlipidemia: Secondary | ICD-10-CM | POA: Diagnosis not present

## 2019-08-20 DIAGNOSIS — I4821 Permanent atrial fibrillation: Secondary | ICD-10-CM | POA: Diagnosis present

## 2019-08-20 DIAGNOSIS — Z8249 Family history of ischemic heart disease and other diseases of the circulatory system: Secondary | ICD-10-CM | POA: Diagnosis not present

## 2019-08-20 DIAGNOSIS — I2510 Atherosclerotic heart disease of native coronary artery without angina pectoris: Secondary | ICD-10-CM | POA: Diagnosis not present

## 2019-08-20 DIAGNOSIS — Z800 Family history of malignant neoplasm of digestive organs: Secondary | ICD-10-CM | POA: Diagnosis not present

## 2019-08-20 DIAGNOSIS — Z8547 Personal history of malignant neoplasm of testis: Secondary | ICD-10-CM | POA: Diagnosis not present

## 2019-08-20 DIAGNOSIS — Z9861 Coronary angioplasty status: Secondary | ICD-10-CM | POA: Diagnosis not present

## 2019-08-20 LAB — RESPIRATORY PANEL BY RT PCR (FLU A&B, COVID)
Influenza A by PCR: NEGATIVE
Influenza B by PCR: NEGATIVE
SARS Coronavirus 2 by RT PCR: NEGATIVE

## 2019-08-20 LAB — ECHOCARDIOGRAM COMPLETE
Height: 72 in
Weight: 3142.88 oz

## 2019-08-20 LAB — TROPONIN I (HIGH SENSITIVITY): Troponin I (High Sensitivity): 10 ng/L (ref <18)

## 2019-08-20 LAB — HIV ANTIBODY (ROUTINE TESTING W REFLEX): HIV Screen 4th Generation wRfx: NONREACTIVE

## 2019-08-20 MED ORDER — METHYLPREDNISOLONE SODIUM SUCC 125 MG IJ SOLR
125.0000 mg | Freq: Once | INTRAMUSCULAR | Status: AC
  Administered 2019-08-20: 125 mg via INTRAVENOUS
  Filled 2019-08-20: qty 2

## 2019-08-20 MED ORDER — NICOTINE 21 MG/24HR TD PT24: 21.0000 mg | MEDICATED_PATCH | Freq: Every day | TRANSDERMAL | Status: DC | PRN

## 2019-08-20 MED ORDER — FUROSEMIDE 10 MG/ML IJ SOLN
40.0000 mg | Freq: Two times a day (BID) | INTRAMUSCULAR | Status: DC
  Administered 2019-08-20 (×2): 40 mg via INTRAVENOUS
  Filled 2019-08-20 (×3): qty 4

## 2019-08-20 MED ORDER — ALBUTEROL SULFATE (2.5 MG/3ML) 0.083% IN NEBU: 3.0000 mL | INHALATION_SOLUTION | RESPIRATORY_TRACT | Status: DC | PRN

## 2019-08-20 MED ORDER — WARFARIN SODIUM 5 MG PO TABS
5.0000 mg | ORAL_TABLET | Freq: Once | ORAL | Status: AC
  Administered 2019-08-20: 5 mg via ORAL
  Filled 2019-08-20: qty 1

## 2019-08-20 MED ORDER — FUROSEMIDE 10 MG/ML IJ SOLN
40.0000 mg | Freq: Once | INTRAMUSCULAR | Status: AC
  Administered 2019-08-20: 40 mg via INTRAVENOUS
  Filled 2019-08-20: qty 4

## 2019-08-20 MED ORDER — MAGNESIUM SULFATE 2 GM/50ML IV SOLN
2.0000 g | Freq: Once | INTRAVENOUS | Status: AC
  Administered 2019-08-20: 2 g via INTRAVENOUS
  Filled 2019-08-20: qty 50

## 2019-08-20 MED ORDER — WARFARIN - PHARMACIST DOSING INPATIENT: Freq: Every day | Status: DC

## 2019-08-20 NOTE — Progress Notes (Signed)
*  PRELIMINARY RESULTS* Echocardiogram 2D Echocardiogram has been performed.  Leavy Cella 08/20/2019, 9:42 AM

## 2019-08-20 NOTE — Care Management Obs Status (Signed)
[place] NOTIFICATION   Patient Details  Name: [name] [name] MRN: [id number] Date of Birth: 1951/01/29   Medicare Observation Status Notification Given:  Yes    Tommy Medal 08/20/2019, 2:51 PM

## 2019-08-20 NOTE — Progress Notes (Signed)
ANTICOAGULATION CONSULT NOTE - Initial Consult  Pharmacy Consult for warfarin Indication: atrial fibrillation  No Known Allergies  Patient Measurements: Height: 6' (182.9 cm) Weight: 89.1 kg (196 lb 6.9 oz) IBW/kg (Calculated) : 77.6   Vital Signs: Temp: 98 F (36.7 C) (04/19 0409) Temp Source: Oral (04/19 0409) BP: 184/97 (04/19 0409) Pulse Rate: 78 (04/19 0409)  Labs: Recent Labs    08/19/19 2225 08/19/19 2242 08/20/19 0036  HGB 12.6*  --   --   HCT 37.8*  --   --   PLT 154  --   --   LABPROT  --  26.8*  --   INR  --  2.5*  --   CREATININE 2.48*  --   --   TROPONINIHS 10  --  10    Estimated Creatinine Clearance: 31.7 mL/min (A) (by C-G formula based on SCr of 2.48 mg/dL (H)).   Medical History: Past Medical History:  Diagnosis Date  . AAA (abdominal aortic aneurysm) ([place])    a. s/p repair 06/2015 with left renal artery bypass at that time, post-op course c/b renal failure requiring dialysis, C dif.  . Anemia   . Arteriosclerotic cardiovascular disease (ASCVD)    a. AMI in 2000 treated at [facility]. b. cath in 12/2006->  Chronic total obstruction of the RCA;  drug-eluting stent placed in OM1, LVEF abnormal.  . Cerebrovascular disease 2002   carotid stent  . Chronic anticoagulation   . Chronic combined systolic and diastolic CHF (congestive heart failure) ([place])   . CKD (chronic kidney disease), stage IV ([place])   . GERD (gastroesophageal reflux disease)   . Hyperlipidemia   . Hypertension   . Myocardial infarction ([place]) 10 yrs ago  . Nephrolithiasis   . Permanent atrial fibrillation ([place])   . PVD (peripheral vascular disease) ([place])    Ct angiogram in 2009 revealed stable disease with 80% celiac stenosis,50% right renal artery ,ASCVD with ulceration in the abdominal aortashe  . Testicular carcinoma ([place]) 1990   right orchiectomy  . Tobacco abuse, in remission    20 pack years; quit in 2009    Medications:  Medications Prior to Admission  Medication Sig  Dispense Refill Last Dose  . albuterol (VENTOLIN HFA) 108 (90 Base) MCG/ACT inhaler Inhale 2-4 puffs into the lungs every 4 (four) hours as needed for wheezing or shortness of breath. 8 g 0   . amLODipine (NORVASC) 10 MG tablet Take 1 tablet by mouth once daily 90 tablet 0   . calcitRIOL (ROCALTROL) 0.5 MCG capsule Take 0.5 mcg by mouth daily.     . Ferrous Sulfate (IRON) 325 (65 Fe) MG TABS TAKE 1 TABLET BY MOUTH THREE TIMES DAILY WITH MEALS 270 tablet 0   . fluticasone (FLONASE) 50 MCG/ACT nasal spray Place 1 spray into both nostrils daily as needed for allergies or rhinitis.     . furosemide (LASIX) 40 MG tablet Take 1 tablet (40 mg total) by mouth every other day. Take 1 tablet by mouth once daily (Patient taking differently: Take 20-40 mg by mouth See admin instructions. Take 40 mg by mouth every other day, alternating with 20 mg) 45 tablet 3   . hydrALAZINE (APRESOLINE) 50 MG tablet Take 1 tablet (50 mg total) by mouth 3 (three) times daily. 270 tablet 3   . metoprolol succinate (TOPROL-XL) 100 MG 24 hr tablet TAKE 1 & 1/2 (ONE & ONE-HALF) TABLETS BY MOUTH ONCE DAILY 135 tablet 6   . nitroGLYCERIN (NITROSTAT) 0.4 MG SL  tablet place 1 tablet under the tongue if needed every 5 minutes for chest pain for 3 doses IF NO RELIEF AFTER 3RD DOSE CALL PRESCRIBER OR 911. (Patient taking differently: Place 0.4 mg under the tongue every 5 (five) minutes as needed for chest pain. place 1 tablet under the tongue if needed every 5 minutes for chest pain for 3 doses IF NO RELIEF AFTER 3RD DOSE CALL PRESCRIBER OR 911.) 25 tablet 2   . ondansetron (ZOFRAN) 4 MG tablet Take 1 tablet (4 mg total) by mouth every 8 (eight) hours as needed for nausea or vomiting. 20 tablet 0   . oxyCODONE-acetaminophen (PERCOCET) 7.5-325 MG tablet Take 1 tablet by mouth every 6 (six) hours as needed. 25 tablet 0   . pantoprazole (PROTONIX) 40 MG tablet Take 1 tablet by mouth once daily 90 tablet 0   . tamsulosin (FLOMAX) 0.4 MG CAPS  capsule Take 1 capsule (0.4 mg total) by mouth daily after supper. 90 capsule 0   . warfarin (COUMADIN) 5 MG tablet Take 1 tablet daily except 1 1/2 tablets on Tuesdays and Fridays 100 tablet 2     Assessment: Pharmacy consulted to dose warfarin in patient with atrial fibrillation.  INR on admission is therapeutic at 2.5.  Home dose listed as 7.5 mg Tue/Fri and 5 mg ROW per anti-coag clinic.  Last dose unknown at this time.  Goal of Therapy:  INR 2-3 Monitor platelets by anticoagulation protocol: Yes   Plan:  Warfarin 5 mg x 1 dose. Daily INR and s/s of bleeding.  Margot Ables, PharmD Clinical Pharmacist 08/20/2019 8:34 AM

## 2019-08-20 NOTE — Progress Notes (Signed)
Consult request has been received. CSW attempting to follow up at present time  Quavis Klutz M. Bernarr Longsworth LCSWA Transitions of Care  Clinical Social Worker  Ph: [phone] 

## 2019-08-20 NOTE — Progress Notes (Signed)
CSW in contact with patient via room phone. CSW interviewed patient regarding home needs and what led to his hospital admission. Patient explains that he had a fall while walking into the bedroom and later learned that his shortness of breath stemmed from CHF and fluid around his heart. Patient does not have a hx of home health services. Patient goes into detail and states that he walks independently without the use of assistive devices. CSW will continue to follow patient for discharge related needs   [place] Transitions of Care  Clinical Social Worker  Ph: [phone]

## 2019-08-20 NOTE — Progress Notes (Signed)
ASSUMPTION OF CARE NOTE   08/20/2019 7:00 PM  [name] [name] was seen and examined.  The H&P by the admitting provider, orders, imaging was reviewed.  Please see new orders.  Will continue to follow.   Vitals:   08/20/19 1448 08/20/19 1610  BP: (!) 171/81 (!) 165/88  Pulse: 77 90  Resp: 20 20  Temp: 98.7 F (37.1 C) 99.1 F (37.3 C)  SpO2: 93% 96%    Results for orders placed or performed during the hospital encounter of 08/19/19  Respiratory Panel by RT PCR (Flu A&B, Covid) - Nasopharyngeal Swab   Specimen: Nasopharyngeal Swab  Result Value Ref Range   SARS Coronavirus 2 by RT PCR NEGATIVE NEGATIVE   Influenza A by PCR NEGATIVE NEGATIVE   Influenza B by PCR NEGATIVE NEGATIVE  CBC with Differential  Result Value Ref Range   WBC 9.0 4.0 - 10.5 K/uL   RBC 4.01 (L) 4.22 - 5.81 MIL/uL   Hemoglobin 12.6 (L) 13.0 - 17.0 g/dL   HCT 37.8 (L) 39.0 - 52.0 %   MCV 94.3 80.0 - 100.0 fL   MCH 31.4 26.0 - 34.0 pg   MCHC 33.3 30.0 - 36.0 g/dL   RDW 13.8 11.5 - 15.5 %   Platelets 154 150 - 400 K/uL   nRBC 0.0 0.0 - 0.2 %   Neutrophils Relative % 74 %   Neutro Abs 6.7 1.7 - 7.7 K/uL   Lymphocytes Relative 14 %   Lymphs Abs 1.3 0.7 - 4.0 K/uL   Monocytes Relative 10 %   Monocytes Absolute 0.9 0.1 - 1.0 K/uL   Eosinophils Relative 1 %   Eosinophils Absolute 0.1 0.0 - 0.5 K/uL   Basophils Relative 0 %   Basophils Absolute 0.0 0.0 - 0.1 K/uL   Immature Granulocytes 1 %   Abs Immature Granulocytes 0.05 0.00 - 0.07 K/uL  Basic metabolic panel  Result Value Ref Range   Sodium 138 135 - 145 mmol/L   Potassium 4.6 3.5 - 5.1 mmol/L   Chloride 106 98 - 111 mmol/L   CO2 21 (L) 22 - 32 mmol/L   Glucose, Bld 112 (H) 70 - 99 mg/dL   BUN 25 (H) 8 - 23 mg/dL   Creatinine, Ser 2.48 (H) 0.61 - 1.24 mg/dL   Calcium 9.2 8.9 - 10.3 mg/dL   GFR calc non Af Amer 26 (L) >60 mL/min   GFR calc Af Amer 30 (L) >60 mL/min   Anion gap 11 5 - 15  Brain natriuretic peptide  Result Value Ref Range   B  Natriuretic Peptide 532.0 (H) 0.0 - 100.0 pg/mL  Protime-INR  Result Value Ref Range   Prothrombin Time 26.8 (H) 11.4 - 15.2 seconds   INR 2.5 (H) 0.8 - 1.2  Hepatic function panel  Result Value Ref Range   Total Protein 7.2 6.5 - 8.1 g/dL   Albumin 3.5 3.5 - 5.0 g/dL   AST 15 15 - 41 U/L   ALT 16 0 - 44 U/L   Alkaline Phosphatase 78 38 - 126 U/L   Total Bilirubin 0.6 0.3 - 1.2 mg/dL   Bilirubin, Direct 0.1 0.0 - 0.2 mg/dL   Indirect Bilirubin 0.5 0.3 - 0.9 mg/dL  HIV Antibody (routine testing w rflx)  Result Value Ref Range   HIV Screen 4th Generation wRfx NON REACTIVE NON REACTIVE  ECHOCARDIOGRAM COMPLETE  Result Value Ref Range   Weight 3,142.88 oz   Height 72 in   BP 184/97 mmHg  Troponin I (High Sensitivity)  Result Value Ref Range   Troponin I (High Sensitivity) 10 <18 ng/L  Troponin I (High Sensitivity)  Result Value Ref Range   Troponin I (High Sensitivity) 10 <18 ng/L     C. Wynetta Emery, MD [facility]   08/19/2019 10:12 PM How to contact the [facility] Attending or Consulting provider [place] or covering provider during after hours [place], for this patient?  1. Check the care team in [facility] and look for a) attending/consulting TR[facility] provider listed and b) the [facility] team listed 2. Log into [email or web address] and use [place]'s universal password to access. If you do not have the password, please contact the hospital operator. 3. Locate the [facility] provider you are looking for under [facility] and page to a number that you can be directly reached. 4. If you still have difficulty reaching the provider, please page the [facility] (Director on Call) for the Hospitalists listed on amion for assistance.

## 2019-08-20 NOTE — H&P (Signed)
History and Physical    [name] [name] KNL:[id number] DOB: June 23, 1950 DOA: 08/19/2019  PCP: Alycia Rossetti, MD   Patient coming from: Home.  I have personally briefly reviewed patient's old medical records in [place]  Chief Complaint: Shortness of breath.  HPI: [name] [name] is a 69 y.o. male with medical history significant of AAA, anemia, CAD, history of MI, history of cerebrovascular disease, carotid stent, chronic combined systolic and diastolic CHF, GERD, hyperlipidemia, hypertension, nephrolithiasis, paroxysmal atrial fibrillation, peripheral vascular disease, history of testicular carcinoma, tobacco abuse of half a pack of cigarettes daily who is coming to the emergency department with complaints of progressively worse dyspnea associated with fatigue, dry cough, wheezing and lower extremity edema.  He mentions he fell onto an old sewing machine injuring his right chest wall.  He denies fever, chills, rhinorrhea, sore throat, precordial chest pain, palpitations, dizziness, diaphoresis, abdominal pain, diarrhea, constipation, melena or hematochezia.  No dysuria, frequency or materia.  No polyuria, polydipsia, polyphagia or blurred vision.  ED Course: Initial vital signs temperature 98 F, pulse 77, respiration 18, blood pressure 182/73 mmHg and O2 sat 98% on room air.  Patient was given supplemental oxygen and 40 mg of furosemide in the emergency department.  His white count is 9.0, hemoglobin 12.6 g/dL and platelets 154.  PT 26.8 seconds and INR 2.5.  Troponin x2 was normal.  BNP was 532.0 pg/mL.  EKG shows atrial flutter with VPC prolonged QT interval.  SARS coronavirus 2 and influenza PCR was negative.  Imaging: Chest radiograph showed cardiomegaly and hyperinflation.  CT chest show cardiomegaly with four-chamber enlargement and three-vessel CAD.  He has only small bilateral pleural effusions.  Stable right adrenal adenoma.  Stable 2.7 cm saccular aneurysm just below the  renal arteries origin.  Please see images and full radiology report for further detail.  Review of Systems: As per HPI otherwise all other systems reviewed and are negative.  Past Medical History:  Diagnosis Date   AAA (abdominal aortic aneurysm) ([place])    a. s/p repair 06/2015 with left renal artery bypass at that time, post-op course c/b renal failure requiring dialysis, C dif.   Anemia    Arteriosclerotic cardiovascular disease (ASCVD)    a. AMI in 2000 treated at [facility]. b. cath in 12/2006->  Chronic total obstruction of the RCA;  drug-eluting stent placed in OM1, LVEF abnormal.   Cerebrovascular disease 2002   carotid stent   Chronic anticoagulation    Chronic combined systolic and diastolic CHF (congestive heart failure) ([place])    CKD (chronic kidney disease), stage IV ([place])    GERD (gastroesophageal reflux disease)    Hyperlipidemia    Hypertension    Myocardial infarction ([place]) 10 yrs ago   Nephrolithiasis    Permanent atrial fibrillation ([facility])    PVD (peripheral vascular disease) ([place])    Ct angiogram in 2009 revealed stable disease with 80% celiac stenosis,50% right renal artery ,ASCVD with ulceration in the abdominal aortashe   Testicular carcinoma ([place]) 1990   right orchiectomy   Tobacco abuse, in remission    20 pack years; quit in 2009    Past Surgical History:  Procedure Laterality Date   ABDOMINAL AORTIC ANEURYSM REPAIR N/A 06/09/2015   Procedure: ANEURYSM ABDOMINAL AORTIC REPAIR;  Surgeon: Elam Dutch, MD;  Location: [place];  Service: Vascular;  Laterality: N/A;   AORTIC ENDARTERECETOMY N/A 06/09/2015   Procedure: AORTIC ENDARTERECETOMY;  Surgeon: Elam Dutch, MD;  Location: [place];  Service: Vascular;  Laterality: N/A;   AORTIC/RENAL BYPASS Left 06/09/2015   Procedure: LEFT RENAL Artery BYPASS;  Surgeon: Elam Dutch, MD;  Location: [facility];  Service: Vascular;  Laterality: Left;   APPENDECTOMY  2004   CHOLECYSTECTOMY N/A 09/18/2018    Procedure: LAPAROSCOPIC CHOLECYSTECTOMY;  Surgeon: Aviva Signs, MD;  Location: [facility];  Service: General;  Laterality: N/A;   COLONOSCOPY  06/17/2011   INCOMPLETE, PREP POOR. Procedure: COLONOSCOPY;  Surgeon: Daneil Dolin, MD;  [facility];  Service: Endoscopy;  Laterality: N/A;  10:00   COLONOSCOPY  07/15/2011   [id number] rectal and colon polyps   COLONOSCOPY N/A 05/22/2015   JJH:[id number] colonic and rectal polyps. tubular adenomas.repeat TCS 05/2018   ESOPHAGEAL DILATION N/A 05/22/2015   Procedure: ESOPHAGEAL DILATION;  Surgeon: Daneil Dolin, MD;  [facility];  Service: Endoscopy;  Laterality: N/A;   ESOPHAGOGASTRODUODENOSCOPY  06/17/2011   severe erosive/ulcerative reflux esophagitis, soft noncritical stricture dilatied, small hh, antral erosion    ESOPHAGOGASTRODUODENOSCOPY N/A 05/22/2015   RMR: 2 cm HH otherwise normal. s/p empirical dilation.   INSERTION OF DIALYSIS CATHETER Left 06/26/2015   Procedure: INSERTION OF DIALYSIS CATHETER;  Surgeon: Angelia Mould, MD;  Location: [place];  Service: Vascular;  Laterality: Left;   PERIPHERAL VASCULAR CATHETERIZATION N/A 05/28/2015   Procedure: Abdominal Aortogram;  Surgeon: Serafina Mitchell, MD;  Location: [place] [facility];  Service: Cardiovascular;  Laterality: N/A;   testicular cancer  1990   right orchiectomy    Social History  reports that he has been smoking cigarettes. He has a 20.00 pack-year smoking history. He has never used smokeless tobacco. He reports that he does not drink alcohol or use drugs.  No Known Allergies  Family History  Problem Relation Age of Onset   Colon cancer Father 39       deceased   Prostate cancer Father    Heart disease Mother    Liver disease Neg Hx    Anesthesia problems Neg Hx    Hypotension Neg Hx    Malignant hyperthermia Neg Hx    Pseudochol deficiency Neg Hx    Prior to Admission medications   Medication Sig Start Date End Date Taking?  Authorizing Provider  albuterol (VENTOLIN HFA) 108 (90 Base) MCG/ACT inhaler Inhale 2-4 puffs into the lungs every 4 (four) hours as needed for wheezing or shortness of breath. 04/06/19   Alycia Rossetti, MD  amLODipine (NORVASC) 10 MG tablet Take 1 tablet by mouth once daily 07/06/19   Alycia Rossetti, MD  calcitRIOL (ROCALTROL) 0.5 MCG capsule Take 0.5 mcg by mouth daily.    [provider]  Ferrous Sulfate (IRON) 325 (65 Fe) MG TABS TAKE 1 TABLET BY MOUTH THREE TIMES DAILY WITH MEALS 08/13/19   Elrod, Modena Nunnery, MD  fluticasone [facility]) 50 MCG/ACT nasal spray Place 1 spray into both nostrils daily as needed for allergies or rhinitis.    [provider]  furosemide (LASIX) 40 MG tablet Take 1 tablet (40 mg total) by mouth every other day. Take 1 tablet by mouth once daily Patient taking differently: Take 20-40 mg by mouth See admin instructions. Take 40 mg by mouth every other day, alternating with 20 mg 07/21/18   Arnoldo Lenis, MD  hydrALAZINE (APRESOLINE) 50 MG tablet Take 1 tablet (50 mg total) by mouth 3 (three) times daily. 11/22/18 04/04/19  Arnoldo Lenis, MD  metoprolol succinate (TOPROL-XL) 100 MG 24 hr tablet TAKE 1 &  1/2 (ONE & ONE-HALF) TABLETS BY MOUTH ONCE DAILY 02/06/19   Arnoldo Lenis, MD  nitroGLYCERIN (NITROSTAT) 0.4 MG SL tablet place 1 tablet under the tongue if needed every 5 minutes for chest pain for 3 doses IF NO RELIEF AFTER 3RD DOSE CALL PRESCRIBER OR 911. Patient taking differently: Place 0.4 mg under the tongue every 5 (five) minutes as needed for chest pain. place 1 tablet under the tongue if needed every 5 minutes for chest pain for 3 doses IF NO RELIEF AFTER 3RD DOSE CALL PRESCRIBER OR 911. 10/25/16   Lendon Colonel, NP  ondansetron (ZOFRAN) 4 MG tablet Take 1 tablet (4 mg total) by mouth every 8 (eight) hours as needed for nausea or vomiting. 08/28/18   Alycia Rossetti, MD  oxyCODONE-acetaminophen (PERCOCET) 7.5-325 MG tablet Take 1  tablet by mouth every 6 (six) hours as needed. 09/18/18 09/18/19  Aviva Signs, MD  pantoprazole (PROTONIX) 40 MG tablet Take 1 tablet by mouth once daily 06/25/19   Alycia Rossetti, MD  tamsulosin (FLOMAX) 0.4 MG CAPS capsule Take 1 capsule (0.4 mg total) by mouth daily after supper. 06/11/19   Alycia Rossetti, MD  warfarin (COUMADIN) 5 MG tablet Take 1 tablet daily except 1 1/2 tablets on Tuesdays and Fridays 04/02/19   Arnoldo Lenis, MD    Physical Exam: Vitals:   08/20/19 0230 08/20/19 0300 08/20/19 0406 08/20/19 0409  BP: (!) 169/92 (!) 160/84  (!) 184/97  Pulse: 86 73  78  Resp: 20 (!) 22  (!) 22  Temp:    98 F (36.7 C)  TempSrc:    Oral  SpO2: 94% 92%  96%  Weight:   89.1 kg   Height:   6' (1.829 m)     Constitutional: NAD, calm, comfortable Eyes: PERRL, lids and conjunctivae normal ENMT: Nasal cannula in place.  Mucous membranes are moist. Posterior pharynx clear of any exudate or lesions. Neck: normal, supple, no masses, no thyromegaly Respiratory: Bilateral wheezing and rhonchi with bibasilar crackles. Normal respiratory effort. No accessory muscle use.  Cardiovascular: Irregularly irregular, no murmurs / rubs / gallops.  2+ lower extremities pitting edema. 2+ pedal pulses. No carotid bruits.  Abdomen: Nondistended.  Soft, right lower chest wall and RUQ tenderness, no masses palpated. No hepatosplenomegaly. Bowel sounds positive.  Musculoskeletal: no clubbing / cyanosis. Good ROM, no contractures. Normal muscle tone.  Skin: Areas of ecchymosis on extremities. Neurologic: CN 2-12 grossly intact. Sensation intact, DTR normal. Strength 5/5 in all 4.  Psychiatric: Normal judgment and insight. Alert and oriented x 3. Normal mood.   Labs on Admission: I have personally reviewed following labs and imaging studies  CBC: Recent Labs  Lab 08/19/19 2225  WBC 9.0  NEUTROABS 6.7  HGB 12.6*  HCT 37.8*  MCV 94.3  PLT 948    Basic Metabolic Panel: Recent Labs  Lab  08/19/19 2225  NA 138  K 4.6  CL 106  CO2 21*  GLUCOSE 112*  BUN 25*  CREATININE 2.48*  CALCIUM 9.2    GFR: Estimated Creatinine Clearance: 31.7 mL/min (A) (by C-G formula based on SCr of 2.48 mg/dL (H)).  Liver Function Tests: Recent Labs  Lab 08/19/19 2225  AST 15  ALT 16  ALKPHOS 78  BILITOT 0.6  PROT 7.2  ALBUMIN 3.5   Radiological Exams on Admission: CT ABDOMEN PELVIS WO CONTRAST  Result Date: 08/20/2019 CLINICAL DATA:  Right chest pain after falling against a sewing machine 4 days prior small  BB marker at the site of discomfort EXAM: CT CHEST, ABDOMEN AND PELVIS WITHOUT CONTRAST TECHNIQUE: Multidetector CT imaging of the chest, abdomen and pelvis was performed following the standard protocol without IV contrast. COMPARISON:  CT abdomen pelvis 07/11/2015, 09/15/2015, 07/15/2019, same day chest radiograph FINDINGS: CT CHEST FINDINGS Cardiovascular: There is cardiomegaly with four-chamber enlargement. Three-vessel coronary artery disease is present. No pericardial effusion. Atherosclerotic plaque within the normal caliber aorta. Normal 3 vessel branching of the aortic arch with moderate calcification of the proximal great vessels which are otherwise unremarkable. Central pulmonary arteries are normal caliber. Luminal evaluation precluded in the absence of contrast media. Mediastinum/Nodes: No mediastinal fluid or gas. Normal thyroid gland and thoracic inlet. No acute abnormality of the trachea. Small hiatal hernia. No other esophageal abnormality. No worrisome mediastinal or axillary adenopathy. Hilar nodal evaluation is limited in the absence of intravenous contrast media. Lungs/Pleura: There are small bilateral pleural effusions with some adjacent mild atelectatic change. There is mild biapical pleuroparenchymal scarring. Some interlobular septal thickening is noted towards the apices and lung bases. No suspicious pulmonary nodules or masses. Few bandlike opacities towards the lung  bases likely reflect atelectasis and/or scarring. Mild hyperinflation of the lungs with flattening of the diaphragms and diffuse mild airways thickening with scattered secretions likely reflecting features of chronic bronchitis. Musculoskeletal: No visible displaced rib fracture is seen at the indicated area by the radiopaque BB marker or elsewhere within the chest. No other acute osseous abnormality or suspicious osseous lesion.There is mild dextrocurvature of the thoracic spine. Slight straightening of the normal thoracic kyphosis. Multilevel degenerative changes are present in the imaged portions of the spine. Mild bilateral gynecomastia. Small probable dermal inclusion cyst along the right chest wall (2/56). CT ABDOMEN PELVIS FINDINGS Hepatobiliary: No visible direct hepatic injury or perihepatic hematoma. No focal liver abnormality is seen. Patient is post cholecystectomy. Slight prominence of the biliary tree likely related to reservoir effect. No calcified intraductal gallstones. Pancreas: Unremarkable. No pancreatic ductal dilatation or surrounding inflammatory changes. Spleen: No direct splenic injury or perisplenic hematoma. Normal splenic size. No concerning splenic lesions. Evaluation of spleen is significantly limited by extensive motion artifact. Adrenals/Urinary Tract: Lobular thickening of the left adrenal gland with a stable 1.6 cm nodule in the medial limb of the right adrenal gland which is not significantly changed from comparison studies, most compatible with an adrenal adenoma. There is asymmetric right renal atrophy likely secondary to the severe stenosis at the right renal artery ostium. There is extensive retroperitoneal stranding which is similar to comparison CT 07/15/2019 though does reflect interval change from more remote comparison exams. Unlikely to reflect an acute traumatic injury. Retroperitoneal stranding does extend along the left ureter. Few calcifications in the renal sinus  could reflect nonobstructing calculus or vascular calcium. No obstructive urolithiasis or hydronephrosis is seen in either kidney. No visible or contour deforming renal lesion. Question some mild bladder wall thickening. Stomach/Bowel: Small hiatal hernia, apparent gastric wall thickening may be related to underdistention. Duodenal sweep takes a normal course across the mid abdomen the. There are several possibly thickened loops of bowel in the left upper quadrant with surrounding edematous changes of the adjacent mesentery which is similar to slightly increased from comparison exam March 2020 though incompletely assessed in the absence of intravenous or luminal contrast. No resulting obstruction is evident. No distal small bowel dilatation or wall thickening. Postsurgical changes near the cecal tip may reflect sequela of prior appendectomy. No colonic wall thickening or dilatation. Vascular/Lymphatic: Extensive atherosclerotic  calcification throughout the abdominal aorta and branch vessels including previously described critical stenosis of the right renal artery. There is a saccular 2.7 cm aneurysm arising from the 4 o'clock position of the aorta just below the origin of the renal arteries. This is unchanged in size from comparison study 07/15/2018. Further vascular evaluation is limited in the absence of intravenous contrast media. The some reactive adenopathy noted in the mesentery and left Reproductive: Mild stranding upon the prostate and seminal vesicles, nonspecific. Other: Edematous changes in the left retroperitoneum and left mesentery, as detailed above. No free air or free fluid. No organized collection or abscess. Soft tissue thickening along the anterior abdomen is nonspecific, could reflect a cellulitis. Edematous changes noted posteriorly. Musculoskeletal: Multilevel degenerative changes are present in the imaged portions of the spine. No acute osseous abnormality or suspicious osseous lesion.  Unchanged inferior endplate deformity L5. The osseous structures appear diffusely demineralized which may limit detection of small or nondisplaced fractures. No acute osseous abnormality or suspicious osseous lesion. IMPRESSION: 1. No convincing acute traumatic abnormality of the right chest wall are elsewhere in the chest abdomen or pelvis. 2. Focal left abdominal proximal small bowel wall thickening and mesenteric stranding as well as diffuse edematous changes in the left retroperitoneum about the kidney and left ureter is similar to slightly increased from the comparison CT 07/15/2019 though does reflect a significant interval change from more remote comparison exams. Unlikely to reflect an acute traumatic injury. Consider correlation with lab values, urinalysis and possible reimaging with enteric luminal contrast to further assess the bowel wall. 3. Cardiomegaly with four-chamber enlargement. Three-vessel coronary artery disease. Additional features heart failure and volume overload with small bilateral pleural effusions with some adjacent mild atelectatic change. Some interlobular septal thickening towards the apices and lung bases is suggestive of pulmonary edema. 4. Stable 1.6 cm right adrenal adenoma. 5. Stable 2.7 cm saccular aneurysm arising from the 4 o'clock position of the aorta just below the origin of the renal arteries. This is unchanged in size from comparison study 07/15/2018. 6. Mild stranding about the prostate, correlate for features of prostatitis. 7. Aortic Atherosclerosis (ICD10-I70.0). These results were called by telephone at the time of interpretation on 08/20/2019 at 12:24 am to provider [facility] , who verbally acknowledged these results. Electronically Signed   By: Lovena Le M.D.   On: 08/20/2019 00:25   DG Chest 2 View  Result Date: 08/19/2019 CLINICAL DATA:  Shortness of breath EXAM: CHEST - 2 VIEW COMPARISON:  07/15/2018 FINDINGS: Cardiomegaly. Mild hyperinflation of  the lungs. No confluent opacities, effusions or edema. No acute bony abnormality. IMPRESSION: Cardiomegaly, hyperinflation.  No active disease. Electronically Signed   By: Rolm Baptise M.D.   On: 08/19/2019 21:52   CT Chest Wo Contrast  Result Date: 08/20/2019 CLINICAL DATA:  Right chest pain after falling against a sewing machine 4 days prior small BB marker at the site of discomfort EXAM: CT CHEST, ABDOMEN AND PELVIS WITHOUT CONTRAST TECHNIQUE: Multidetector CT imaging of the chest, abdomen and pelvis was performed following the standard protocol without IV contrast. COMPARISON:  CT abdomen pelvis 07/11/2015, 09/15/2015, 07/15/2019, same day chest radiograph FINDINGS: CT CHEST FINDINGS Cardiovascular: There is cardiomegaly with four-chamber enlargement. Three-vessel coronary artery disease is present. No pericardial effusion. Atherosclerotic plaque within the normal caliber aorta. Normal 3 vessel branching of the aortic arch with moderate calcification of the proximal great vessels which are otherwise unremarkable. Central pulmonary arteries are normal caliber. Luminal evaluation precluded in the absence  of contrast media. Mediastinum/Nodes: No mediastinal fluid or gas. Normal thyroid gland and thoracic inlet. No acute abnormality of the trachea. Small hiatal hernia. No other esophageal abnormality. No worrisome mediastinal or axillary adenopathy. Hilar nodal evaluation is limited in the absence of intravenous contrast media. Lungs/Pleura: There are small bilateral pleural effusions with some adjacent mild atelectatic change. There is mild biapical pleuroparenchymal scarring. Some interlobular septal thickening is noted towards the apices and lung bases. No suspicious pulmonary nodules or masses. Few bandlike opacities towards the lung bases likely reflect atelectasis and/or scarring. Mild hyperinflation of the lungs with flattening of the diaphragms and diffuse mild airways thickening with scattered  secretions likely reflecting features of chronic bronchitis. Musculoskeletal: No visible displaced rib fracture is seen at the indicated area by the radiopaque BB marker or elsewhere within the chest. No other acute osseous abnormality or suspicious osseous lesion.There is mild dextrocurvature of the thoracic spine. Slight straightening of the normal thoracic kyphosis. Multilevel degenerative changes are present in the imaged portions of the spine. Mild bilateral gynecomastia. Small probable dermal inclusion cyst along the right chest wall (2/56). CT ABDOMEN PELVIS FINDINGS Hepatobiliary: No visible direct hepatic injury or perihepatic hematoma. No focal liver abnormality is seen. Patient is post cholecystectomy. Slight prominence of the biliary tree likely related to reservoir effect. No calcified intraductal gallstones. Pancreas: Unremarkable. No pancreatic ductal dilatation or surrounding inflammatory changes. Spleen: No direct splenic injury or perisplenic hematoma. Normal splenic size. No concerning splenic lesions. Evaluation of spleen is significantly limited by extensive motion artifact. Adrenals/Urinary Tract: Lobular thickening of the left adrenal gland with a stable 1.6 cm nodule in the medial limb of the right adrenal gland which is not significantly changed from comparison studies, most compatible with an adrenal adenoma. There is asymmetric right renal atrophy likely secondary to the severe stenosis at the right renal artery ostium. There is extensive retroperitoneal stranding which is similar to comparison CT 07/15/2019 though does reflect interval change from more remote comparison exams. Unlikely to reflect an acute traumatic injury. Retroperitoneal stranding does extend along the left ureter. Few calcifications in the renal sinus could reflect nonobstructing calculus or vascular calcium. No obstructive urolithiasis or hydronephrosis is seen in either kidney. No visible or contour deforming renal  lesion. Question some mild bladder wall thickening. Stomach/Bowel: Small hiatal hernia, apparent gastric wall thickening may be related to underdistention. Duodenal sweep takes a normal course across the mid abdomen the. There are several possibly thickened loops of bowel in the left upper quadrant with surrounding edematous changes of the adjacent mesentery which is similar to slightly increased from comparison exam March 2020 though incompletely assessed in the absence of intravenous or luminal contrast. No resulting obstruction is evident. No distal small bowel dilatation or wall thickening. Postsurgical changes near the cecal tip may reflect sequela of prior appendectomy. No colonic wall thickening or dilatation. Vascular/Lymphatic: Extensive atherosclerotic calcification throughout the abdominal aorta and branch vessels including previously described critical stenosis of the right renal artery. There is a saccular 2.7 cm aneurysm arising from the 4 o'clock position of the aorta just below the origin of the renal arteries. This is unchanged in size from comparison study 07/15/2018. Further vascular evaluation is limited in the absence of intravenous contrast media. The some reactive adenopathy noted in the mesentery and left Reproductive: Mild stranding upon the prostate and seminal vesicles, nonspecific. Other: Edematous changes in the left retroperitoneum and left mesentery, as detailed above. No free air or free fluid. No organized collection  or abscess. Soft tissue thickening along the anterior abdomen is nonspecific, could reflect a cellulitis. Edematous changes noted posteriorly. Musculoskeletal: Multilevel degenerative changes are present in the imaged portions of the spine. No acute osseous abnormality or suspicious osseous lesion. Unchanged inferior endplate deformity L5. The osseous structures appear diffusely demineralized which may limit detection of small or nondisplaced fractures. No acute osseous  abnormality or suspicious osseous lesion. IMPRESSION: 1. No convincing acute traumatic abnormality of the right chest wall are elsewhere in the chest abdomen or pelvis. 2. Focal left abdominal proximal small bowel wall thickening and mesenteric stranding as well as diffuse edematous changes in the left retroperitoneum about the kidney and left ureter is similar to slightly increased from the comparison CT 07/15/2019 though does reflect a significant interval change from more remote comparison exams. Unlikely to reflect an acute traumatic injury. Consider correlation with lab values, urinalysis and possible reimaging with enteric luminal contrast to further assess the bowel wall. 3. Cardiomegaly with four-chamber enlargement. Three-vessel coronary artery disease. Additional features heart failure and volume overload with small bilateral pleural effusions with some adjacent mild atelectatic change. Some interlobular septal thickening towards the apices and lung bases is suggestive of pulmonary edema. 4. Stable 1.6 cm right adrenal adenoma. 5. Stable 2.7 cm saccular aneurysm arising from the 4 o'clock position of the aorta just below the origin of the renal arteries. This is unchanged in size from comparison study 07/15/2018. 6. Mild stranding about the prostate, correlate for features of prostatitis. 7. Aortic Atherosclerosis (ICD10-I70.0). These results were called by telephone at the time of interpretation on 08/20/2019 at 12:24 am to provider [facility] , who verbally acknowledged these results. Electronically Signed   By: Lovena Le M.D.   On: 08/20/2019 00:25   05/19/2017 echocardiogram -------------------------------------------------------------------  LV EF: 50%   -------------------------------------------------------------------  Indications:   CHF - 428.0.   -------------------------------------------------------------------  History:  PMH: GERD. Atrial fibrillation. Coronary  artery  disease. Risk factors: Current tobacco use. Hypertension.  Dyslipidemia.   -------------------------------------------------------------------  Study Conclusions   - Left ventricle: The cavity size was normal. Wall thickness was  increased in a pattern of mild LVH. Systolic function was at the  lower limits of normal. The estimated ejection fraction was 50%.  Diffuse hypokinesis. The study was not technically sufficient to  allow evaluation of LV diastolic dysfunction due to atrial  fibrillation. Doppler parameters are consistent with high  ventricular filling pressure.  - Aortic valve: Trileaflet; mildly thickened leaflets.  - Mitral valve: There was mild regurgitation.  - Left atrium: The atrium was moderately dilated.  - Right atrium: The atrium was moderately dilated.   ------------------------------------------------------------------- EKG: Independently reviewed. Vent. rate 94 BPM PR interval * ms QRS duration 108 ms QT/QTc 413/517 ms P-R-T axes * -39 24 Atrial flutter Ventricular premature complex Probable anterior infarct, old Prolonged QT interval Baseline wander in lead(s) II  Assessment/Plan Principal Problem:   Acute on chronic systolic congestive heart failure (HCC) Observation/telemetry. Continue supplemental oxygen. Sodium and fluid restriction. Monitor daily weights, intake and output. Continue furosemide 40 mg IVP twice daily. Monitor electrolytes and replace as needed. Follow-up renal function daily. Check echocardiogram. Consider cardiology evaluation.  Active Problems:   CAD - s/p PCI to Cx. 100% RCA CTO No chest pain. Need to reconcile metoprolol. Not sure if on statin. On warfarin.    Paroxysmal A-fib (HCC) CHA?DS?-VASc Score of at least 4. Warfarin per pharmacy. Continue metoprolol after med rec done.    Prolonged QT interval  Magnesium sulfate. Keep electrolytes optimized. Avoid QT prolonging medications     Hyperlipidemia Not on statin per old med reconciliation. The patient is not sure if he is taken it or not.    Essential hypertension On hydralazine, metoprolol, furosemide and amlodipine. Resume treatment after pharmacy reconciliation. Monitor blood pressure.    Tobacco abuse Encouraged to quit smoking. Nicotine replacement therapy ordered. Staff to provide tobacco cessation information.    GERD (gastroesophageal reflux disease) On Protonix     DVT prophylaxis: On warfarin. Code Status:   Full code. Family Communication: Disposition Plan:   Patient is from:  Home.  Anticipated DC to:  08/21/2019  Anticipated DC date:  08/21/2019  Anticipated DC barriers: Clinical improvement.  Consults called: Admission status:  Observation/telemetry.    Severity of Illness: Moderate.     Reubin Milan MD [facility]  How to contact the [facility] Attending or Consulting provider [place] or covering provider during after hours [place], for this patient?   1. Check the care team in [facility] and look for a) attending/consulting [facility] provider listed and b) the [facility] team listed 2. Log into [email or web address] and use [place]'s universal password to access. If you do not have the password, please contact the hospital operator. 3. Locate the [facility] provider you are looking for under [facility] and page to a number that you can be directly reached. 4. If you still have difficulty reaching the provider, please page the [facility] (Director on Call) for the Hospitalists listed on amion for assistance.  08/20/2019, 6:13 AM   This document was prepared using Dragon voice recognition software and may contain some unintended transcription errors.

## 2019-08-21 ENCOUNTER — Inpatient Hospital Stay (HOSPITAL_COMMUNITY): Payer: Medicare Other

## 2019-08-21 DIAGNOSIS — E782 Mixed hyperlipidemia: Secondary | ICD-10-CM

## 2019-08-21 DIAGNOSIS — I480 Paroxysmal atrial fibrillation: Secondary | ICD-10-CM

## 2019-08-21 DIAGNOSIS — Z720 Tobacco use: Secondary | ICD-10-CM

## 2019-08-21 DIAGNOSIS — I2510 Atherosclerotic heart disease of native coronary artery without angina pectoris: Secondary | ICD-10-CM

## 2019-08-21 LAB — MAGNESIUM: Magnesium: 2.7 mg/dL — ABNORMAL HIGH (ref 1.7–2.4)

## 2019-08-21 LAB — BASIC METABOLIC PANEL
Anion gap: 9 (ref 5–15)
BUN: 42 mg/dL — ABNORMAL HIGH (ref 8–23)
CO2: 23 mmol/L (ref 22–32)
Calcium: 9.1 mg/dL (ref 8.9–10.3)
Chloride: 107 mmol/L (ref 98–111)
Creatinine, Ser: 2.89 mg/dL — ABNORMAL HIGH (ref 0.61–1.24)
GFR calc Af Amer: 25 mL/min — ABNORMAL LOW (ref >60)
GFR calc non Af Amer: 21 mL/min — ABNORMAL LOW (ref >60)
Glucose, Bld: 147 mg/dL — ABNORMAL HIGH (ref 70–99)
Potassium: 4.3 mmol/L (ref 3.5–5.1)
Sodium: 139 mmol/L (ref 135–145)

## 2019-08-21 LAB — CBC
HCT: 37.1 % — ABNORMAL LOW (ref 39.0–52.0)
Hemoglobin: 12.1 g/dL — ABNORMAL LOW (ref 13.0–17.0)
MCH: 30.3 pg (ref 26.0–34.0)
MCHC: 32.6 g/dL (ref 30.0–36.0)
MCV: 92.8 fL (ref 80.0–100.0)
Platelets: 165 10*3/uL (ref 150–400)
RBC: 4 MIL/uL — ABNORMAL LOW (ref 4.22–5.81)
RDW: 13.3 % (ref 11.5–15.5)
WBC: 12.3 10*3/uL — ABNORMAL HIGH (ref 4.0–10.5)
nRBC: 0 % (ref 0.0–0.2)

## 2019-08-21 LAB — PROTIME-INR
INR: 2.6 — ABNORMAL HIGH (ref 0.8–1.2)
Prothrombin Time: 27.6 seconds — ABNORMAL HIGH (ref 11.4–15.2)

## 2019-08-21 MED ORDER — METOPROLOL SUCCINATE ER 50 MG PO TB24: 150.0000 mg | ORAL_TABLET | Freq: Every day | ORAL | Status: DC

## 2019-08-21 MED ORDER — PANTOPRAZOLE SODIUM 40 MG PO TBEC
40.0000 mg | DELAYED_RELEASE_TABLET | Freq: Every day | ORAL | Status: DC
  Administered 2019-08-21 – 2019-08-22 (×2): 40 mg via ORAL
  Filled 2019-08-21 (×2): qty 1

## 2019-08-21 MED ORDER — TAMSULOSIN HCL 0.4 MG PO CAPS
0.4000 mg | ORAL_CAPSULE | Freq: Every day | ORAL | Status: DC
  Administered 2019-08-21: 0.4 mg via ORAL
  Filled 2019-08-21: qty 1

## 2019-08-21 MED ORDER — FLUTICASONE PROPIONATE 50 MCG/ACT NA SUSP: 1.0000 | Freq: Every day | NASAL | Status: DC | PRN

## 2019-08-21 MED ORDER — CALCITRIOL 0.25 MCG PO CAPS
0.2500 ug | ORAL_CAPSULE | Freq: Every day | ORAL | Status: DC
  Administered 2019-08-21 – 2019-08-22 (×2): 0.25 ug via ORAL
  Filled 2019-08-21 (×2): qty 1

## 2019-08-21 MED ORDER — FERROUS SULFATE 325 (65 FE) MG PO TABS
325.0000 mg | ORAL_TABLET | Freq: Every day | ORAL | Status: DC
  Administered 2019-08-22: 325 mg via ORAL
  Filled 2019-08-21: qty 1

## 2019-08-21 MED ORDER — WARFARIN SODIUM 7.5 MG PO TABS
7.5000 mg | ORAL_TABLET | Freq: Once | ORAL | Status: AC
  Administered 2019-08-21: 7.5 mg via ORAL
  Filled 2019-08-21: qty 1

## 2019-08-21 MED ORDER — METOPROLOL SUCCINATE ER 50 MG PO TB24
75.0000 mg | ORAL_TABLET | Freq: Every day | ORAL | Status: DC
  Administered 2019-08-21 – 2019-08-22 (×2): 75 mg via ORAL
  Filled 2019-08-21 (×2): qty 1

## 2019-08-21 MED ORDER — HYDRALAZINE HCL 25 MG PO TABS
50.0000 mg | ORAL_TABLET | Freq: Three times a day (TID) | ORAL | Status: DC
  Administered 2019-08-21 – 2019-08-22 (×3): 50 mg via ORAL
  Filled 2019-08-21 (×3): qty 2

## 2019-08-21 MED ORDER — METHYLPREDNISOLONE SODIUM SUCC 125 MG IJ SOLR: 60.0000 mg | Freq: Once | INTRAMUSCULAR | Status: DC

## 2019-08-21 MED ORDER — FUROSEMIDE 10 MG/ML IJ SOLN
40.0000 mg | Freq: Every day | INTRAMUSCULAR | Status: DC
  Administered 2019-08-21: 40 mg via INTRAVENOUS

## 2019-08-21 MED ORDER — IPRATROPIUM-ALBUTEROL 0.5-2.5 (3) MG/3ML IN SOLN
3.0000 mL | Freq: Three times a day (TID) | RESPIRATORY_TRACT | Status: DC
  Administered 2019-08-21: 3 mL via RESPIRATORY_TRACT

## 2019-08-21 MED ORDER — FUROSEMIDE 10 MG/ML IJ SOLN: 30.0000 mg | Freq: Every day | INTRAMUSCULAR | Status: DC

## 2019-08-21 MED ORDER — GUAIFENESIN ER 600 MG PO TB12
1200.0000 mg | ORAL_TABLET | Freq: Two times a day (BID) | ORAL | Status: DC
  Administered 2019-08-21 – 2019-08-22 (×2): 1200 mg via ORAL
  Filled 2019-08-21 (×3): qty 2

## 2019-08-21 MED ORDER — FUROSEMIDE 10 MG/ML IJ SOLN: 20.0000 mg | Freq: Every day | INTRAMUSCULAR | Status: DC

## 2019-08-21 MED ORDER — DOXYCYCLINE HYCLATE 100 MG PO TABS
100.0000 mg | ORAL_TABLET | Freq: Two times a day (BID) | ORAL | Status: DC
  Administered 2019-08-21 – 2019-08-22 (×3): 100 mg via ORAL
  Filled 2019-08-21 (×3): qty 1

## 2019-08-21 MED ORDER — HYDROCOD POLST-CPM POLST ER 10-8 MG/5ML PO SUER: 5.0000 mL | Freq: Two times a day (BID) | ORAL | Status: DC | PRN

## 2019-08-21 MED ORDER — IPRATROPIUM-ALBUTEROL 0.5-2.5 (3) MG/3ML IN SOLN
3.0000 mL | Freq: Three times a day (TID) | RESPIRATORY_TRACT | Status: DC
  Filled 2019-08-21: qty 3

## 2019-08-21 MED ORDER — AMLODIPINE BESYLATE 5 MG PO TABS
10.0000 mg | ORAL_TABLET | Freq: Every day | ORAL | Status: DC
  Administered 2019-08-21 – 2019-08-22 (×2): 10 mg via ORAL
  Filled 2019-08-21 (×2): qty 2

## 2019-08-21 NOTE — Evaluation (Signed)
Physical Therapy Evaluation Patient Details Name: [name] [name] MRN: [id number] DOB: [birth year] Today's Date: 08/21/2019   History of Present Illness  [name] [name] is a 69 y.o. male with medical history significant of AAA, anemia, CAD, history of MI, history of cerebrovascular disease, carotid stent, chronic combined systolic and diastolic CHF, GERD, hyperlipidemia, hypertension, nephrolithiasis, paroxysmal atrial fibrillation, peripheral vascular disease, history of testicular carcinoma, tobacco abuse of half a pack of cigarettes daily who is coming to the emergency department with complaints of progressively worse dyspnea associated with fatigue, dry cough, wheezing and lower extremity edema.  He mentions he fell onto an old sewing machine injuring his right chest wall.  He denies fever, chills, rhinorrhea, sore throat, precordial chest pain, palpitations, dizziness, diaphoresis, abdominal pain, diarrhea, constipation, melena or hematochezia.  No dysuria, frequency or materia.  No polyuria, polydipsia, polyphagia or blurred vision.    Clinical Impression  Patient functioning near baseline for functional mobility and gait. He requires slightly increased time for bed mobility but is able to complete without assist. He demonstrates minimal unsteadiness with transfer to standing without loss of balance. Patient O2 sat  at rest on 1L is 95%, Patient ambualtes without supplemental O2 at 93-95% saturation. He ambulates with minimal unsteadiness and begins wheezing and having minimal SOB after 100 feet but patient does not have c/o difficulty breathing and saturation above 93%. Patient returned to room and set up in chair for breakfast with wife present. Patient educated on using rollator at home if he feels he is weak or unsteady. Patient and wife are educated on following up with OP PT in future should he feel weak or have balance issues. Patient will benefit from continued physical therapy in hospital and  recommended venue below to increase strength, balance, endurance for safe ADLs and gait.      Follow Up Recommendations No PT follow up    Equipment Recommendations  None recommended by PT    Recommendations for Other Services       Precautions / Restrictions Precautions Precautions: Fall Restrictions Weight Bearing Restrictions: No      Mobility  Bed Mobility Overal bed mobility: Modified Independent             General bed mobility comments: slightly increased time  Transfers Overall transfer level: Modified independent Equipment used: None             General transfer comment: to transfer to/from standing from bed and chair, minimal unsteadiness upon standing  Ambulation/Gait Ambulation/Gait assistance: Min guard Gait Distance (Feet): 150 Feet Assistive device: None Gait Pattern/deviations: Step-through pattern;Decreased step length - right;Decreased step length - left;Decreased stride length Gait velocity: decreased   General Gait Details: min guard for safety and balance, patient has no loss of balance, Patient o2 sat at rest on 1L 95%. Patient ambualtes without supplemental O2 at 93-95% saturation. minimal increase in wheezing/SOB after 100 feet  Stairs            Wheelchair Mobility    Modified Rankin (Stroke Patients Only)       Balance Overall balance assessment: Needs assistance Sitting-balance support: Feet supported;No upper extremity supported Sitting balance-Leahy Scale: Good Sitting balance - Comments: seated EoB   Standing balance support: No upper extremity supported;During functional activity Standing balance-Leahy Scale: Fair Standing balance comment: without AD                             Pertinent Vitals/Pain  Pain Assessment: No/denies pain    Home Living Family/patient expects to be discharged to:: Private residence Living Arrangements: Spouse/significant other Available Help at Discharge:  Family;Available 24 hours/day Type of Home: House Home Access: Stairs to enter Entrance Stairs-Rails: Right Entrance Stairs-Number of Steps: 4 Home Layout: One level Home Equipment: Walker - 4 wheels;Cane - single point;Bedside commode;Shower seat      Prior Function Level of Independence: Independent         Comments: Patient states he is a limited community ambulator without AD, able to grocery shop, independent with basic ADL     Hand Dominance   Dominant Hand: Right    Extremity/Trunk Assessment   Upper Extremity Assessment Upper Extremity Assessment: Overall WFL for tasks assessed    Lower Extremity Assessment Lower Extremity Assessment: Generalized weakness    Cervical / Trunk Assessment Cervical / Trunk Assessment: Normal  Communication   Communication: No difficulties  Cognition Arousal/Alertness: Awake/alert Behavior During Therapy: WFL for tasks assessed/performed Overall Cognitive Status: Within Functional Limits for tasks assessed                                        General Comments      Exercises     Assessment/Plan    PT Assessment Patient needs continued PT services  PT Problem List Decreased strength;Decreased activity tolerance;Decreased balance;Decreased mobility       PT Treatment Interventions DME instruction;Balance training;Gait training;Neuromuscular re-education;Stair training;Functional mobility training;Patient/family education;Therapeutic activities;Therapeutic exercise;Manual techniques    PT Goals (Current goals can be found in the Care Plan section)  Acute Rehab PT Goals Patient Stated Goal: return home Time For Goal Achievement: 08/31/19 Potential to Achieve Goals: Good    Frequency Min 3X/week   Barriers to discharge        Co-evaluation               AM-PAC PT "6 Clicks" Mobility  Outcome Measure Help needed turning from your back to your side while in a flat bed without using bedrails?:  None Help needed moving from lying on your back to sitting on the side of a flat bed without using bedrails?: None Help needed moving to and from a bed to a chair (including a wheelchair)?: A Little Help needed standing up from a chair using your arms (e.g., wheelchair or bedside chair)?: None Help needed to walk in hospital room?: A Little Help needed climbing 3-5 steps with a railing? : A Little 6 Click Score: 21    End of Session Equipment Utilized During Treatment: Gait belt;Oxygen Activity Tolerance: Patient tolerated treatment well Patient left: in chair;with call bell/phone within reach;with family/visitor present Nurse Communication: Mobility status PT Visit Diagnosis: Unsteadiness on feet (R26.81);Other abnormalities of gait and mobility (R26.89);Muscle weakness (generalized) (M62.81);History of falling (Z91.81)    Time: 3716-9678 PT Time Calculation (min) (ACUTE ONLY): 26 min   Charges:   PT Evaluation $PT Eval Moderate Complexity: 1 Mod PT Treatments $Therapeutic Activity: 8-22 mins        8:56 AM, 08/21/19 Mearl Latin PT, DPT Physical Therapist at [facility]

## 2019-08-21 NOTE — Progress Notes (Signed)
PROGRESS NOTE   [name] [name]  QAS:[id number] DOB: May 23, 1950 DOA: 08/19/2019 PCP: Alycia Rossetti, MD   Chief Complaint  Patient presents with  . Shortness of Breath    Brief Narrative:  69 y.o. male with medical history significant of AAA, anemia, CAD, history of MI, history of cerebrovascular disease, carotid stent, chronic combined systolic and diastolic CHF, GERD, hyperlipidemia, hypertension, nephrolithiasis, paroxysmal atrial fibrillation, peripheral vascular disease, history of testicular carcinoma, tobacco abuse of half a pack of cigarettes daily who is coming to the emergency department with complaints of progressively worse dyspnea associated with fatigue, dry cough, wheezing and lower extremity edema.    Assessment & Plan:   Principal Problem:   Acute on chronic diastolic congestive heart failure (HCC) Active Problems:   Hyperlipidemia   Essential hypertension   Tobacco abuse   GERD (gastroesophageal reflux disease)   CAD - s/p PCI to Cx. 100% RCA CTO   Paroxysmal A-fib (HCC)   Prolonged QT interval   Acute systolic CHF (congestive heart failure) (HCC)   COPD exacerbation    1. Acute diastolic CHF exacerbation - pt has responded very well to IV lasix and diuresed nearly 3.5L.  I am reducing the IV Lasix dose today and will continue to monitor intake and output.  2D echo with findings of preserved EF indeterminate diastolic function. 2. Acute COPD exacerbation-ordered DuoNebs 3 times daily, doxycycline 100 mg twice daily, Mucinex, cough syrup and Solu-Medrol x1 dose IV. 3. Essential hypertension-resume home blood pressure medications, slightly reduced dose of metoprolol due to heart rate. 4. CAD-stable-resume home medications and follow. 5. GERD-Protonix ordered for GI protection. 6. Tobacco abuse-patient advised to quit smoking will offer nicotine patch. 7. Chronic atrial fibrillation-he is fully anticoagulated with warfarin being managed by the Pharm.D., rate is  well controlled on metoprolol. 8. Cerebrovascular disease-stable 9. Peripheral vascular disease and resume home medications.   DVT prophylaxis: Warfarin Code Status: Full Family Communication: Wife at bedside updated Disposition:   Status is: Inpatient  Remains inpatient appropriate because:IV treatments appropriate due to intensity of illness or inability to take PO   Dispo: The patient is from: Home              Anticipated d/c is to: Home              Anticipated d/c date is: 1 day   I told the patient that if he felt well tomorrow he likely could discharge home              Patient currently is not medically stable to d/c.    Consultants:     Procedures:   2D Echo 08/20/19 1. Left ventricular ejection fraction, by estimation, is 50 to 55%. The  left ventricle has low normal function. The left ventricle has no regional  wall motion abnormalities. There is moderate concentric left ventricular  hypertrophy. Left ventricular  diastolic parameters are indeterminate. Elevated left ventricular  end-diastolic pressure.  2. Right ventricular systolic function is normal. The right ventricular  size is normal.  3. Left atrial size was severely dilated.  4. Right atrial size was mild to moderately dilated.  5. The mitral valve is grossly normal. Mild mitral valve regurgitation.  6. The aortic valve is tricuspid. Aortic valve regurgitation is trivial.  No aortic stenosis is present.  7. The inferior vena cava is dilated in size with >50% respiratory  variability, suggesting right atrial pressure of 8 mmHg.   Antimicrobials:     Subjective:  Patient says he remains weak but overall starting to feel better  Objective: Vitals:   08/20/19 1610 08/20/19 2018 08/20/19 2107 08/21/19 0550  BP: (!) 165/88 (!) 164/86  (!) 166/89  Pulse: 90 81  77  Resp: 20 18  18   Temp: 99.1 F (37.3 C) 98.3 F (36.8 C)  98 F (36.7 C)  TempSrc: Oral Oral  Oral  SpO2: 96% 97% 95% 95%   Weight:      Height:        Intake/Output Summary (Last 24 hours) at 08/21/2019 1442 Last data filed at 08/21/2019 1000 Gross per 24 hour  Intake 240 ml  Output 3200 ml  Net -2960 ml   Filed Weights   08/19/19 2106 08/20/19 0406  Weight: 84.8 kg 89.1 kg    Examination:  General exam: Appears calm and comfortable  Respiratory system: Rare crackles in both bases.  Anterior wheezes heard bilateral with expiration, Respiratory effort normal. Cardiovascular system: S1 & S2 heard, RRR. No JVD, murmurs, rubs, gallops or clicks.  Trace pedal edema. Gastrointestinal system: Abdomen is nondistended, soft and nontender. No organomegaly or masses felt. Normal bowel sounds heard. Central nervous system: Alert and oriented. No focal neurological deficits. Extremities: Symmetric 5 x 5 power.  Trace edema bilateral lower extremities Skin: No rashes, lesions or ulcers Psychiatry: Judgement and insight appear normal. Mood & affect appropriate.   Data Reviewed: I have personally reviewed following labs and imaging studies  CBC: Recent Labs  Lab 08/19/19 2225 08/21/19 0457  WBC 9.0 12.3*  NEUTROABS 6.7  --   HGB 12.6* 12.1*  HCT 37.8* 37.1*  MCV 94.3 92.8  PLT 154 329    Basic Metabolic Panel: Recent Labs  Lab 08/19/19 2225 08/21/19 0457  NA 138 139  K 4.6 4.3  CL 106 107  CO2 21* 23  GLUCOSE 112* 147*  BUN 25* 42*  CREATININE 2.48* 2.89*  CALCIUM 9.2 9.1  MG  --  2.7*    GFR: Estimated Creatinine Clearance: 27.2 mL/min (A) (by C-G formula based on SCr of 2.89 mg/dL (H)).  Liver Function Tests: Recent Labs  Lab 08/19/19 2225  AST 15  ALT 16  ALKPHOS 78  BILITOT 0.6  PROT 7.2  ALBUMIN 3.5    CBG: No results for input(s): GLUCAP in the last 168 hours.   Recent Results (from the past 240 hour(s))  Respiratory Panel by RT PCR (Flu A&B, Covid) - Nasopharyngeal Swab     Status: None   Collection Time: 08/20/19 12:33 AM   Specimen: Nasopharyngeal Swab  Result  Value Ref Range Status   SARS Coronavirus 2 by RT PCR NEGATIVE NEGATIVE Final    Comment: (NOTE) SARS-CoV-2 target nucleic acids are NOT DETECTED. The SARS-CoV-2 RNA is generally detectable in upper respiratoy specimens during the acute phase of infection. The lowest concentration of SARS-CoV-2 viral copies this assay can detect is 131 copies/mL. A negative result does not preclude SARS-Cov-2 infection and should not be used as the sole basis for treatment or other patient management decisions. A negative result may occur with  improper specimen collection/handling, submission of specimen other than nasopharyngeal swab, presence of viral mutation(s) within the areas targeted by this assay, and inadequate number of viral copies (<131 copies/mL). A negative result must be combined with clinical observations, patient history, and epidemiological information. The expected result is Negative. Fact Sheet for Patients:  PinkCheek.be Fact Sheet for Healthcare Providers:  GravelBags.it This test is not yet ap proved or cleared by  the [employer] and  has been authorized for detection and/or diagnosis of SARS-CoV-2 by FDA under an Emergency Use Authorization (EUA). This EUA will remain  in effect (meaning this test can be used) for the duration of the COVID-19 declaration under Section 564(b)(1) of the Act, 21 U.S.C. section 360bbb-3(b)(1), unless the authorization is terminated or revoked sooner.    Influenza A by PCR NEGATIVE NEGATIVE Final   Influenza B by PCR NEGATIVE NEGATIVE Final    Comment: (NOTE) The Xpert Xpress SARS-CoV-2/FLU/RSV assay is intended as an aid in  the diagnosis of influenza from Nasopharyngeal swab specimens and  should not be used as a sole basis for treatment. Nasal washings and  aspirates are unacceptable for Xpert Xpress SARS-CoV-2/FLU/RSV  testing. Fact Sheet for  Patients: PinkCheek.be Fact Sheet for Healthcare Providers: GravelBags.it This test is not yet approved or cleared by the [place] FDA and  has been authorized for detection and/or diagnosis of SARS-CoV-2 by  FDA under an Emergency Use Authorization (EUA). This EUA will remain  in effect (meaning this test can be used) for the duration of the  Covid-19 declaration under Section 564(b)(1) of the Act, 21  U.S.C. section 360bbb-3(b)(1), unless the authorization is  terminated or revoked. Performed at [facility], [address], [place], [place] [address]    Radiology Studies: CT ABDOMEN PELVIS WO CONTRAST  Result Date: 08/20/2019 CLINICAL DATA:  Right chest pain after falling against a sewing machine 4 days prior small BB marker at the site of discomfort EXAM: CT CHEST, ABDOMEN AND PELVIS WITHOUT CONTRAST TECHNIQUE: Multidetector CT imaging of the chest, abdomen and pelvis was performed following the standard protocol without IV contrast. COMPARISON:  CT abdomen pelvis 07/11/2015, 09/15/2015, 07/15/2019, same day chest radiograph FINDINGS: CT CHEST FINDINGS Cardiovascular: There is cardiomegaly with four-chamber enlargement. Three-vessel coronary artery disease is present. No pericardial effusion. Atherosclerotic plaque within the normal caliber aorta. Normal 3 vessel branching of the aortic arch with moderate calcification of the proximal great vessels which are otherwise unremarkable. Central pulmonary arteries are normal caliber. Luminal evaluation precluded in the absence of contrast media. Mediastinum/Nodes: No mediastinal fluid or gas. Normal thyroid gland and thoracic inlet. No acute abnormality of the trachea. Small hiatal hernia. No other esophageal abnormality. No worrisome mediastinal or axillary adenopathy. Hilar nodal evaluation is limited in the absence of intravenous contrast media. Lungs/Pleura: There are small bilateral  pleural effusions with some adjacent mild atelectatic change. There is mild biapical pleuroparenchymal scarring. Some interlobular septal thickening is noted towards the apices and lung bases. No suspicious pulmonary nodules or masses. Few bandlike opacities towards the lung bases likely reflect atelectasis and/or scarring. Mild hyperinflation of the lungs with flattening of the diaphragms and diffuse mild airways thickening with scattered secretions likely reflecting features of chronic bronchitis. Musculoskeletal: No visible displaced rib fracture is seen at the indicated area by the radiopaque BB marker or elsewhere within the chest. No other acute osseous abnormality or suspicious osseous lesion.There is mild dextrocurvature of the thoracic spine. Slight straightening of the normal thoracic kyphosis. Multilevel degenerative changes are present in the imaged portions of the spine. Mild bilateral gynecomastia. Small probable dermal inclusion cyst along the right chest wall (2/56). CT ABDOMEN PELVIS FINDINGS Hepatobiliary: No visible direct hepatic injury or perihepatic hematoma. No focal liver abnormality is seen. Patient is post cholecystectomy. Slight prominence of the biliary tree likely related to reservoir effect. No calcified intraductal gallstones. Pancreas: Unremarkable. No pancreatic ductal dilatation or surrounding inflammatory changes.  Spleen: No direct splenic injury or perisplenic hematoma. Normal splenic size. No concerning splenic lesions. Evaluation of spleen is significantly limited by extensive motion artifact. Adrenals/Urinary Tract: Lobular thickening of the left adrenal gland with a stable 1.6 cm nodule in the medial limb of the right adrenal gland which is not significantly changed from comparison studies, most compatible with an adrenal adenoma. There is asymmetric right renal atrophy likely secondary to the severe stenosis at the right renal artery ostium. There is extensive retroperitoneal  stranding which is similar to comparison CT 07/15/2019 though does reflect interval change from more remote comparison exams. Unlikely to reflect an acute traumatic injury. Retroperitoneal stranding does extend along the left ureter. Few calcifications in the renal sinus could reflect nonobstructing calculus or vascular calcium. No obstructive urolithiasis or hydronephrosis is seen in either kidney. No visible or contour deforming renal lesion. Question some mild bladder wall thickening. Stomach/Bowel: Small hiatal hernia, apparent gastric wall thickening may be related to underdistention. Duodenal sweep takes a normal course across the mid abdomen the. There are several possibly thickened loops of bowel in the left upper quadrant with surrounding edematous changes of the adjacent mesentery which is similar to slightly increased from comparison exam March 2020 though incompletely assessed in the absence of intravenous or luminal contrast. No resulting obstruction is evident. No distal small bowel dilatation or wall thickening. Postsurgical changes near the cecal tip may reflect sequela of prior appendectomy. No colonic wall thickening or dilatation. Vascular/Lymphatic: Extensive atherosclerotic calcification throughout the abdominal aorta and branch vessels including previously described critical stenosis of the right renal artery. There is a saccular 2.7 cm aneurysm arising from the 4 o'clock position of the aorta just below the origin of the renal arteries. This is unchanged in size from comparison study 07/15/2018. Further vascular evaluation is limited in the absence of intravenous contrast media. The some reactive adenopathy noted in the mesentery and left Reproductive: Mild stranding upon the prostate and seminal vesicles, nonspecific. Other: Edematous changes in the left retroperitoneum and left mesentery, as detailed above. No free air or free fluid. No organized collection or abscess. Soft tissue thickening  along the anterior abdomen is nonspecific, could reflect a cellulitis. Edematous changes noted posteriorly. Musculoskeletal: Multilevel degenerative changes are present in the imaged portions of the spine. No acute osseous abnormality or suspicious osseous lesion. Unchanged inferior endplate deformity L5. The osseous structures appear diffusely demineralized which may limit detection of small or nondisplaced fractures. No acute osseous abnormality or suspicious osseous lesion. IMPRESSION: 1. No convincing acute traumatic abnormality of the right chest wall are elsewhere in the chest abdomen or pelvis. 2. Focal left abdominal proximal small bowel wall thickening and mesenteric stranding as well as diffuse edematous changes in the left retroperitoneum about the kidney and left ureter is similar to slightly increased from the comparison CT 07/15/2019 though does reflect a significant interval change from more remote comparison exams. Unlikely to reflect an acute traumatic injury. Consider correlation with lab values, urinalysis and possible reimaging with enteric luminal contrast to further assess the bowel wall. 3. Cardiomegaly with four-chamber enlargement. Three-vessel coronary artery disease. Additional features heart failure and volume overload with small bilateral pleural effusions with some adjacent mild atelectatic change. Some interlobular septal thickening towards the apices and lung bases is suggestive of pulmonary edema. 4. Stable 1.6 cm right adrenal adenoma. 5. Stable 2.7 cm saccular aneurysm arising from the 4 o'clock position of the aorta just below the origin of the renal arteries. This is  unchanged in size from comparison study 07/15/2018. 6. Mild stranding about the prostate, correlate for features of prostatitis. 7. Aortic Atherosclerosis (ICD10-I70.0). These results were called by telephone at the time of interpretation on 08/20/2019 at 12:24 am to provider [facility] , who verbally  acknowledged these results. Electronically Signed   By: Lovena Le M.D.   On: 08/20/2019 00:25   DG Chest 2 View  Result Date: 08/19/2019 CLINICAL DATA:  Shortness of breath EXAM: CHEST - 2 VIEW COMPARISON:  07/15/2018 FINDINGS: Cardiomegaly. Mild hyperinflation of the lungs. No confluent opacities, effusions or edema. No acute bony abnormality. IMPRESSION: Cardiomegaly, hyperinflation.  No active disease. Electronically Signed   By: Rolm Baptise M.D.   On: 08/19/2019 21:52   CT Chest Wo Contrast  Result Date: 08/20/2019 CLINICAL DATA:  Right chest pain after falling against a sewing machine 4 days prior small BB marker at the site of discomfort EXAM: CT CHEST, ABDOMEN AND PELVIS WITHOUT CONTRAST TECHNIQUE: Multidetector CT imaging of the chest, abdomen and pelvis was performed following the standard protocol without IV contrast. COMPARISON:  CT abdomen pelvis 07/11/2015, 09/15/2015, 07/15/2019, same day chest radiograph FINDINGS: CT CHEST FINDINGS Cardiovascular: There is cardiomegaly with four-chamber enlargement. Three-vessel coronary artery disease is present. No pericardial effusion. Atherosclerotic plaque within the normal caliber aorta. Normal 3 vessel branching of the aortic arch with moderate calcification of the proximal great vessels which are otherwise unremarkable. Central pulmonary arteries are normal caliber. Luminal evaluation precluded in the absence of contrast media. Mediastinum/Nodes: No mediastinal fluid or gas. Normal thyroid gland and thoracic inlet. No acute abnormality of the trachea. Small hiatal hernia. No other esophageal abnormality. No worrisome mediastinal or axillary adenopathy. Hilar nodal evaluation is limited in the absence of intravenous contrast media. Lungs/Pleura: There are small bilateral pleural effusions with some adjacent mild atelectatic change. There is mild biapical pleuroparenchymal scarring. Some interlobular septal thickening is noted towards the apices and  lung bases. No suspicious pulmonary nodules or masses. Few bandlike opacities towards the lung bases likely reflect atelectasis and/or scarring. Mild hyperinflation of the lungs with flattening of the diaphragms and diffuse mild airways thickening with scattered secretions likely reflecting features of chronic bronchitis. Musculoskeletal: No visible displaced rib fracture is seen at the indicated area by the radiopaque BB marker or elsewhere within the chest. No other acute osseous abnormality or suspicious osseous lesion.There is mild dextrocurvature of the thoracic spine. Slight straightening of the normal thoracic kyphosis. Multilevel degenerative changes are present in the imaged portions of the spine. Mild bilateral gynecomastia. Small probable dermal inclusion cyst along the right chest wall (2/56). CT ABDOMEN PELVIS FINDINGS Hepatobiliary: No visible direct hepatic injury or perihepatic hematoma. No focal liver abnormality is seen. Patient is post cholecystectomy. Slight prominence of the biliary tree likely related to reservoir effect. No calcified intraductal gallstones. Pancreas: Unremarkable. No pancreatic ductal dilatation or surrounding inflammatory changes. Spleen: No direct splenic injury or perisplenic hematoma. Normal splenic size. No concerning splenic lesions. Evaluation of spleen is significantly limited by extensive motion artifact. Adrenals/Urinary Tract: Lobular thickening of the left adrenal gland with a stable 1.6 cm nodule in the medial limb of the right adrenal gland which is not significantly changed from comparison studies, most compatible with an adrenal adenoma. There is asymmetric right renal atrophy likely secondary to the severe stenosis at the right renal artery ostium. There is extensive retroperitoneal stranding which is similar to comparison CT 07/15/2019 though does reflect interval change from more remote comparison exams. Unlikely to  reflect an acute traumatic injury.  Retroperitoneal stranding does extend along the left ureter. Few calcifications in the renal sinus could reflect nonobstructing calculus or vascular calcium. No obstructive urolithiasis or hydronephrosis is seen in either kidney. No visible or contour deforming renal lesion. Question some mild bladder wall thickening. Stomach/Bowel: Small hiatal hernia, apparent gastric wall thickening may be related to underdistention. Duodenal sweep takes a normal course across the mid abdomen the. There are several possibly thickened loops of bowel in the left upper quadrant with surrounding edematous changes of the adjacent mesentery which is similar to slightly increased from comparison exam March 2020 though incompletely assessed in the absence of intravenous or luminal contrast. No resulting obstruction is evident. No distal small bowel dilatation or wall thickening. Postsurgical changes near the cecal tip may reflect sequela of prior appendectomy. No colonic wall thickening or dilatation. Vascular/Lymphatic: Extensive atherosclerotic calcification throughout the abdominal aorta and branch vessels including previously described critical stenosis of the right renal artery. There is a saccular 2.7 cm aneurysm arising from the 4 o'clock position of the aorta just below the origin of the renal arteries. This is unchanged in size from comparison study 07/15/2018. Further vascular evaluation is limited in the absence of intravenous contrast media. The some reactive adenopathy noted in the mesentery and left Reproductive: Mild stranding upon the prostate and seminal vesicles, nonspecific. Other: Edematous changes in the left retroperitoneum and left mesentery, as detailed above. No free air or free fluid. No organized collection or abscess. Soft tissue thickening along the anterior abdomen is nonspecific, could reflect a cellulitis. Edematous changes noted posteriorly. Musculoskeletal: Multilevel degenerative changes are present in  the imaged portions of the spine. No acute osseous abnormality or suspicious osseous lesion. Unchanged inferior endplate deformity L5. The osseous structures appear diffusely demineralized which may limit detection of small or nondisplaced fractures. No acute osseous abnormality or suspicious osseous lesion. IMPRESSION: 1. No convincing acute traumatic abnormality of the right chest wall are elsewhere in the chest abdomen or pelvis. 2. Focal left abdominal proximal small bowel wall thickening and mesenteric stranding as well as diffuse edematous changes in the left retroperitoneum about the kidney and left ureter is similar to slightly increased from the comparison CT 07/15/2019 though does reflect a significant interval change from more remote comparison exams. Unlikely to reflect an acute traumatic injury. Consider correlation with lab values, urinalysis and possible reimaging with enteric luminal contrast to further assess the bowel wall. 3. Cardiomegaly with four-chamber enlargement. Three-vessel coronary artery disease. Additional features heart failure and volume overload with small bilateral pleural effusions with some adjacent mild atelectatic change. Some interlobular septal thickening towards the apices and lung bases is suggestive of pulmonary edema. 4. Stable 1.6 cm right adrenal adenoma. 5. Stable 2.7 cm saccular aneurysm arising from the 4 o'clock position of the aorta just below the origin of the renal arteries. This is unchanged in size from comparison study 07/15/2018. 6. Mild stranding about the prostate, correlate for features of prostatitis. 7. Aortic Atherosclerosis (ICD10-I70.0). These results were called by telephone at the time of interpretation on 08/20/2019 at 12:24 am to provider [facility] , who verbally acknowledged these results. Electronically Signed   By: Lovena Le M.D.   On: 08/20/2019 00:25   DG CHEST PORT 1 VIEW  Result Date: 08/21/2019 CLINICAL DATA:  Pulmonary  edema, progressively worsening dyspnea, fatigue, dry cough wheezing and lower extremity edema, history hypertension, coronary artery disease post MI, smoker EXAM: PORTABLE CHEST 1 VIEW COMPARISON:  Portable exam 0909 hours compared to 08/19/2019 FINDINGS: Mild enlargement of cardiac silhouette. Mediastinal contours and pulmonary vascularity normal. Atherosclerotic calcification aorta. Minimal bronchitic changes with subsegmental atelectasis RIGHT base. No acute infiltrate, pleural effusion or pneumothorax. Osseous structures unremarkable. Probable RIGHT nipple shadow. IMPRESSION: Bronchitic changes with subsegmental atelectasis RIGHT base. Electronically Signed   By: Lavonia Dana M.D.   On: 08/21/2019 11:02   ECHOCARDIOGRAM COMPLETE  Result Date: 08/20/2019    ECHOCARDIOGRAM REPORT   Patient Name:   [name] [name] Date of Exam: 08/20/2019 Medical Rec #:  [id number]       Height:       72.0 in Accession #:    [id number]      Weight:       196.4 lb Date of Birth:  1951-02-07       BSA:          2.114 m Patient Age:    44 years        BP:           184/97 mmHg Patient Gender: M               HR:           78 bpm. Exam Location:  [facility] Procedure: 2D Echo Indications:    CHF-Acute Systolic 623.76 / E83.15  History:        Patient has prior history of Echocardiogram examinations, most                 recent 05/19/2017. CAD, Stroke, Arrythmias:Atrial Fibrillation;                 Risk Factors:Hypertension, Dyslipidemia and Current Smoker.                 Testicular cancer.  Sonographer:    Leavy Cella RDCS (AE) Referring Phys: [id number] DAVID MANUEL [place]  1. Left ventricular ejection fraction, by estimation, is 50 to 55%. The left ventricle has low normal function. The left ventricle has no regional wall motion abnormalities. There is moderate concentric left ventricular hypertrophy. Left ventricular diastolic parameters are indeterminate. Elevated left ventricular end-diastolic pressure.  2. Right  ventricular systolic function is normal. The right ventricular size is normal.  3. Left atrial size was severely dilated.  4. Right atrial size was mild to moderately dilated.  5. The mitral valve is grossly normal. Mild mitral valve regurgitation.  6. The aortic valve is tricuspid. Aortic valve regurgitation is trivial. No aortic stenosis is present.  7. The inferior vena cava is dilated in size with >50% respiratory variability, suggesting right atrial pressure of 8 mmHg. FINDINGS  Left Ventricle: Left ventricular ejection fraction, by estimation, is 50 to 55%. The left ventricle has low normal function. The left ventricle has no regional wall motion abnormalities. The left ventricular internal cavity size was normal in size. There is moderate concentric left ventricular hypertrophy. Left ventricular diastolic parameters are indeterminate. Elevated left ventricular end-diastolic pressure. Right Ventricle: The right ventricular size is normal. No increase in right ventricular wall thickness. Right ventricular systolic function is normal. Left Atrium: Left atrial size was severely dilated. Right Atrium: Right atrial size was mild to moderately dilated. Pericardium: There is no evidence of pericardial effusion. Mitral Valve: The mitral valve is grossly normal. Mild mitral annular calcification. Mild mitral valve regurgitation. Tricuspid Valve: The tricuspid valve is grossly normal. Tricuspid valve regurgitation is mild. Aortic Valve: The aortic valve is tricuspid. . There is mild thickening of  the aortic valve. Aortic valve regurgitation is trivial. No aortic stenosis is present. There is mild thickening of the aortic valve. Pulmonic Valve: The pulmonic valve was grossly normal. Pulmonic valve regurgitation is not visualized. Aorta: The aortic root is normal in size and structure. Venous: The inferior vena cava is dilated in size with greater than 50% respiratory variability, suggesting right atrial pressure of 8  mmHg. IAS/Shunts: No atrial level shunt detected by color flow Doppler.  LEFT VENTRICLE PLAX 2D LVIDd:         4.39 cm  Diastology LVIDs:         3.16 cm  LV e' lateral:   10.30 cm/s LV PW:         1.34 cm  LV E/e' lateral: 13.9 LV IVS:        1.48 cm  LV e' medial:    5.11 cm/s LVOT diam:     2.10 cm  LV E/e' medial:  28.0 LVOT Area:     3.46 cm  RIGHT VENTRICLE RV S prime:     13.70 cm/s TAPSE (M-mode): 1.7 cm LEFT ATRIUM              Index LA diam:        4.60 cm  2.18 cm/m LA Vol (A2C):   156.0 ml 73.78 ml/m LA Vol (A4C):   60.9 ml  28.80 ml/m LA Biplane Vol: 101.0 ml 47.77 ml/m   AORTA Ao Root diam: 3.10 cm MITRAL VALVE MV Area (PHT): 3.63 cm     SHUNTS MV Decel Time: 209 msec     Systemic Diam: 2.10 cm MV E velocity: 143.00 cm/s MV A velocity: 36.50 cm/s MV E/A ratio:  3.92 Kate Sable MD Electronically signed by Kate Sable MD Signature Date/Time: 08/20/2019/10:57:02 AM    Final    Scheduled Meds: . Derrill Memo ON 08/22/2019] furosemide  30 mg Intravenous Daily  . warfarin  7.5 mg Oral ONCE-1600  . Warfarin - Pharmacist Dosing Inpatient   Does not apply q1600   Continuous Infusions:   LOS: 1 day   Time spent:23 minutes   Irwin Brakeman, MD [facility]   To contact the attending provider between 7A-7P or the covering provider during after hours 7P-7A, please log into the web site [email or web address] and access using universal [place] password for that web site. If you do not have the password, please call the hospital operator.  08/21/2019, 2:42 PM

## 2019-08-21 NOTE — Progress Notes (Signed)
ANTICOAGULATION CONSULT NOTE -   Pharmacy Consult for warfarin Indication: atrial fibrillation  No Known Allergies  Patient Measurements: Height: 6' (182.9 cm) Weight: 89.1 kg (196 lb 6.9 oz) IBW/kg (Calculated) : 77.6   Vital Signs: Temp: 98 F (36.7 C) (04/20 0550) Temp Source: Oral (04/20 0550) BP: 166/89 (04/20 0550) Pulse Rate: 77 (04/20 0550)  Labs: Recent Labs    08/19/19 2225 08/19/19 2242 08/20/19 0036 08/21/19 0457  HGB 12.6*  --   --  12.1*  HCT 37.8*  --   --  37.1*  PLT 154  --   --  165  LABPROT  --  26.8*  --  27.6*  INR  --  2.5*  --  2.6*  CREATININE 2.48*  --   --  2.89*  TROPONINIHS 10  --  10  --     Estimated Creatinine Clearance: 27.2 mL/min (A) (by C-G formula based on SCr of 2.89 mg/dL (H)).   Medical History: Past Medical History:  Diagnosis Date  . AAA (abdominal aortic aneurysm) ([place])    a. s/p repair 06/2015 with left renal artery bypass at that time, post-op course c/b renal failure requiring dialysis, C dif.  . Anemia   . Arteriosclerotic cardiovascular disease (ASCVD)    a. AMI in 2000 treated at [facility]. b. cath in 12/2006->  Chronic total obstruction of the RCA;  drug-eluting stent placed in OM1, LVEF abnormal.  . Cerebrovascular disease 2002   carotid stent  . Chronic anticoagulation   . Chronic combined systolic and diastolic CHF (congestive heart failure) ([place])   . CKD (chronic kidney disease), stage IV ([place])   . GERD (gastroesophageal reflux disease)   . Hyperlipidemia   . Hypertension   . Myocardial infarction ([place]) 10 yrs ago  . Nephrolithiasis   . Permanent atrial fibrillation ([place])   . PVD (peripheral vascular disease) ([place])    Ct angiogram in 2009 revealed stable disease with 80% celiac stenosis,50% right renal artery ,ASCVD with ulceration in the abdominal aortashe  . Testicular carcinoma ([place]) 1990   right orchiectomy  . Tobacco abuse, in remission    20 pack years; quit in 2009    Medications:  Medications  Prior to Admission  Medication Sig Dispense Refill Last Dose  . albuterol (VENTOLIN HFA) 108 (90 Base) MCG/ACT inhaler Inhale 2-4 puffs into the lungs every 4 (four) hours as needed for wheezing or shortness of breath. 8 g 0 Past Week at Unknown time  . amLODipine (NORVASC) 10 MG tablet Take 1 tablet by mouth once daily (Patient taking differently: Take 10 mg by mouth daily. take 1 tablet by mouth once daily) 90 tablet 0 08/19/2019 at Unknown time  . BESIVANCE 0.6 % SUSP Place 1 drop into the left eye 4 (four) times daily.    08/19/2019 at Unknown time  . calcitRIOL (ROCALTROL) 0.5 MCG capsule Take 0.25 mcg by mouth daily.    08/19/2019 at Unknown time  . Ferrous Sulfate (IRON) 325 (65 Fe) MG TABS TAKE 1 TABLET BY MOUTH THREE TIMES DAILY WITH MEALS (Patient taking differently: Take 325 mg by mouth in the morning, at noon, and at bedtime. ) 270 tablet 0 08/19/2019 at Unknown time  . fluticasone (FLONASE) 50 MCG/ACT nasal spray Place 1 spray into both nostrils daily as needed for allergies or rhinitis.   Past Week at Unknown time  . furosemide (LASIX) 40 MG tablet Take 1 tablet (40 mg total) by mouth every other day. Take 1 tablet by mouth  once daily (Patient taking differently: Take 20-40 mg by mouth See admin instructions. Take 40 mg by mouth every other day, alternating with 20 mg) 45 tablet 3 08/19/2019 at Unknown time  . hydrALAZINE (APRESOLINE) 50 MG tablet Take 1 tablet (50 mg total) by mouth 3 (three) times daily. (Patient taking differently: Take 75 mg by mouth 3 (three) times daily. ) 270 tablet 3 08/19/2019 at Unknown time  . metoprolol succinate (TOPROL-XL) 100 MG 24 hr tablet TAKE 1 & 1/2 (ONE & ONE-HALF) TABLETS BY MOUTH ONCE DAILY (Patient taking differently: Take 150 mg by mouth daily. ) 135 tablet 6 08/19/2019 at 0830  . pantoprazole (PROTONIX) 40 MG tablet Take 1 tablet by mouth once daily (Patient taking differently: Take 40 mg by mouth daily. ) 90 tablet 0 08/19/2019 at Unknown time  .  tamsulosin (FLOMAX) 0.4 MG CAPS capsule Take 1 capsule (0.4 mg total) by mouth daily after supper. 90 capsule 0 08/19/2019 at Unknown time  . warfarin (COUMADIN) 5 MG tablet Take 1 tablet daily except 1 1/2 tablets on Tuesdays and Fridays (Patient taking differently: Take 5 mg by mouth See admin instructions. Take 5 mg daily except for 7.5 mg on Tuesdays and Fridays) 100 tablet 2 08/19/2019 at 0630  . nitroGLYCERIN (NITROSTAT) 0.4 MG SL tablet place 1 tablet under the tongue if needed every 5 minutes for chest pain for 3 doses IF NO RELIEF AFTER 3RD DOSE CALL PRESCRIBER OR 911. (Patient taking differently: Place 0.4 mg under the tongue every 5 (five) minutes as needed for chest pain. place 1 tablet under the tongue if needed every 5 minutes for chest pain for 3 doses IF NO RELIEF AFTER 3RD DOSE CALL PRESCRIBER OR 911.) 25 tablet 2 unknown  . ondansetron (ZOFRAN) 4 MG tablet Take 1 tablet (4 mg total) by mouth every 8 (eight) hours as needed for nausea or vomiting. 20 tablet 0 unknown  . oxyCODONE-acetaminophen (PERCOCET) 7.5-325 MG tablet Take 1 tablet by mouth every 6 (six) hours as needed. (Patient not taking: Reported on 08/20/2019) 25 tablet 0 Not Taking at Unknown time  . prednisoLONE acetate (PRED FORTE) 1 % ophthalmic suspension Place 1 drop into the right eye 4 (four) times daily.    Not Taking at Unknown time    Assessment: Pharmacy consulted to dose warfarin in patient with atrial fibrillation.  INR on admission is therapeutic at 2.5.  Home dose listed as 7.5 mg Tue/Fri and 5 mg ROW per anti-coag clinic.  Last dose unknown at this time. INR is therapeutic 2.6  Goal of Therapy:  INR 2-3 Monitor platelets by anticoagulation protocol: Yes   Plan:  Warfarin 7.5 mg x 1 dose. Daily INR and s/s of bleeding.  Isac Sarna, BS Pharm D, [place] Clinical Pharmacist Pager [phone] 08/21/2019 11:21 AM

## 2019-08-21 NOTE — Plan of Care (Signed)
  Problem: Acute Rehab PT Goals(only PT should resolve) Goal: Pt Will Ambulate Outcome: Progressing Flowsheets (Taken 08/21/2019 0857) Pt will Ambulate:  with modified independence  > 125 feet Goal: Pt/caregiver will Perform Home Exercise Program Outcome: Progressing Flowsheets (Taken 08/21/2019 0857) Pt/caregiver will Perform Home Exercise Program:  For increased strengthening  For improved balance  Independently  8:57 AM, 08/21/19 Mearl Latin PT, DPT Physical Therapist at [facility]

## 2019-08-22 ENCOUNTER — Other Ambulatory Visit: Payer: Self-pay | Admitting: Cardiology

## 2019-08-22 LAB — BASIC METABOLIC PANEL
Anion gap: 11 (ref 5–15)
BUN: 46 mg/dL — ABNORMAL HIGH (ref 8–23)
CO2: 22 mmol/L (ref 22–32)
Calcium: 8.5 mg/dL — ABNORMAL LOW (ref 8.9–10.3)
Chloride: 105 mmol/L (ref 98–111)
Creatinine, Ser: 3.04 mg/dL — ABNORMAL HIGH (ref 0.61–1.24)
GFR calc Af Amer: 23 mL/min — ABNORMAL LOW (ref >60)
GFR calc non Af Amer: 20 mL/min — ABNORMAL LOW (ref >60)
Glucose, Bld: 97 mg/dL (ref 70–99)
Potassium: 3.7 mmol/L (ref 3.5–5.1)
Sodium: 138 mmol/L (ref 135–145)

## 2019-08-22 LAB — PROTIME-INR
INR: 2.8 — ABNORMAL HIGH (ref 0.8–1.2)
Prothrombin Time: 29.8 seconds — ABNORMAL HIGH (ref 11.4–15.2)

## 2019-08-22 MED ORDER — GUAIFENESIN ER 600 MG PO TB12: 1200.0000 mg | ORAL_TABLET | Freq: Two times a day (BID) | ORAL | 0 refills | Status: AC

## 2019-08-22 NOTE — Progress Notes (Signed)
IV removed and discharge instructions reviewed.  Scripts sent to pharmacy.  Wife to drive home

## 2019-08-22 NOTE — Discharge Summary (Signed)
Physician Discharge Summary  Samvel Zinn Lehmkuhl KZL:[id number] DOB: 1950/09/11 DOA: 08/19/2019  PCP: Alycia Rossetti, MD  Admit date: 08/19/2019  Discharge date: 08/22/2019  Admitted From:Home  Disposition:  Home  Recommendations for Outpatient Follow-up:  1. Follow up with PCP in 1-2 weeks 2. Please obtain BMP repeat in one week to ensure creatinine levels have stabilized to usual baseline of 2.0-2.2 3. Continue on usual home medications to include oral Lasix to resume on 4/22 as discussed with patient 4. Continue to use albuterol as needed for shortness of breath or wheezing at home  Home Health: None  Equipment/Devices: None  Discharge Condition: Stable  CODE STATUS: Full  Diet recommendation: Heart Healthy  Brief/Interim Summary: 69 y.o.malewith medical history significant ofAAA, anemia, CAD, history of MI, history of cerebrovascular disease, carotid stent, chronic combined systolic and diastolic CHF, GERD, hyperlipidemia, hypertension, nephrolithiasis, paroxysmal atrial fibrillation, peripheral vascular disease, history of testicular carcinoma, tobacco abuse of half a pack of cigarettes daily who is coming to the emergency department with complaints of progressively worse dyspnea associated with fatigue, dry cough, wheezing and lower extremity edema.   1. Acute diastolic CHF exacerbation -resolved.  Pt has responded very well to IV lasix and diuresed nearly 4.9L.   No further IV Lasix due to renal intolerance and creatinine of 3.04 noted today.  Plans to resume home Lasix on 4/22 as discussed with patient and follow-up BMP in the outpatient setting in 1 week. 2D echo with findings of preserved EF indeterminate diastolic function. 2. AKI on CKD stage IIIb.  Secondary to Lasix use.  He is nonoliguric and does not have any hyperkalemia or acidosis.  No signs of uremia noted.  He is otherwise stable for discharge and is to hold Lasix today and resume oral Lasix dosing as usual on 4/22  with close follow-up to PCP with repeat BMP in 1 week. 3. Acute COPD exacerbation-resolved.  Patient given Mucinex as needed and has home inhaler as needed for shortness of breath or wheezing.  This did not appear to be serious, nor did it cause significant hypoxemia.  He has no further need for doxycycline at home. 4. Essential hypertension-resume home blood pressure medications as prior with Lasix to reinitiate on 4/22.  Again, plan for repeat BMP in 1 week. 5. CAD-stable-resume home medications and follow. 6. Tobacco abuse-patient advised to quit smoking. 7. Chronic atrial fibrillation-he is fully anticoagulated with warfarin with therapeutic INR. 8. Cerebrovascular disease-stable 9. Peripheral vascular disease and resume home medications.  Discharge Diagnoses:  Principal Problem:   Acute on chronic systolic congestive heart failure (HCC) Active Problems:   Hyperlipidemia   Essential hypertension   Tobacco abuse   GERD (gastroesophageal reflux disease)   CAD - s/p PCI to Cx. 100% RCA CTO   Paroxysmal A-fib (HCC)   Prolonged QT interval   Acute systolic CHF (congestive heart failure) (HCC)   COPD exacerbation (HCC)  Principal discharge diagnosis: Acute on chronic diastolic CHF exacerbation.  Discharge Instructions  Discharge Instructions    Diet - low sodium heart healthy   Complete by: As directed    Increase activity slowly   Complete by: As directed      Allergies as of 08/22/2019   No Known Allergies     Medication List    TAKE these medications   albuterol 108 (90 Base) MCG/ACT inhaler Commonly known as: VENTOLIN HFA Inhale 2-4 puffs into the lungs every 4 (four) hours as needed for wheezing or shortness of breath.  amLODipine 10 MG tablet Commonly known as: NORVASC Take 1 tablet by mouth once daily What changed: additional instructions   Besivance 0.6 % Susp Generic drug: Besifloxacin HCl Place 1 drop into the left eye 4 (four) times daily.   calcitRIOL  0.5 MCG capsule Commonly known as: ROCALTROL Take 0.25 mcg by mouth daily.   fluticasone 50 MCG/ACT nasal spray Commonly known as: FLONASE Place 1 spray into both nostrils daily as needed for allergies or rhinitis.   furosemide 40 MG tablet Commonly known as: LASIX Take 1 tablet (40 mg total) by mouth every other day. Take 1 tablet by mouth once daily What changed:   how much to take  when to take this  additional instructions   guaiFENesin 600 MG 12 hr tablet Commonly known as: MUCINEX Take 2 tablets (1,200 mg total) by mouth 2 (two) times daily for 10 days.   hydrALAZINE 50 MG tablet Commonly known as: APRESOLINE Take 1 tablet (50 mg total) by mouth 3 (three) times daily. What changed: how much to take   Iron 325 (65 Fe) MG Tabs TAKE 1 TABLET BY MOUTH THREE TIMES DAILY WITH MEALS What changed: when to take this   metoprolol succinate 100 MG 24 hr tablet Commonly known as: TOPROL-XL TAKE 1 & 1/2 (ONE & ONE-HALF) TABLETS BY MOUTH ONCE DAILY What changed: See the new instructions.   nitroGLYCERIN 0.4 MG SL tablet Commonly known as: Nitrostat place 1 tablet under the tongue if needed every 5 minutes for chest pain for 3 doses IF NO RELIEF AFTER 3RD DOSE CALL PRESCRIBER OR 911. What changed:   how much to take  how to take this  when to take this  reasons to take this   ondansetron 4 MG tablet Commonly known as: ZOFRAN Take 1 tablet (4 mg total) by mouth every 8 (eight) hours as needed for nausea or vomiting.   pantoprazole 40 MG tablet Commonly known as: PROTONIX Take 1 tablet by mouth once daily   prednisoLONE acetate 1 % ophthalmic suspension Commonly known as: PRED FORTE Place 1 drop into the right eye 4 (four) times daily.   tamsulosin 0.4 MG Caps capsule Commonly known as: FLOMAX Take 1 capsule (0.4 mg total) by mouth daily after supper.   warfarin 5 MG tablet Commonly known as: COUMADIN Take as directed. If you are unsure how to take this  medication, talk to your nurse or doctor. Original instructions: Take 1 tablet daily except 1 1/2 tablets on Tuesdays and Fridays What changed:   how much to take  how to take this  when to take this  additional instructions      Follow-up Information    [place], Modena Nunnery, MD Follow up in 1 week(s).   Specialty: Family Medicine Contact information: [address] [place] [address] [phone]          No Known Allergies  Consultations:  None   Procedures/Studies: CT ABDOMEN PELVIS WO CONTRAST  Result Date: 08/20/2019 CLINICAL DATA:  Right chest pain after falling against a sewing machine 4 days prior small BB marker at the site of discomfort EXAM: CT CHEST, ABDOMEN AND PELVIS WITHOUT CONTRAST TECHNIQUE: Multidetector CT imaging of the chest, abdomen and pelvis was performed following the standard protocol without IV contrast. COMPARISON:  CT abdomen pelvis 07/11/2015, 09/15/2015, 07/15/2019, same day chest radiograph FINDINGS: CT CHEST FINDINGS Cardiovascular: There is cardiomegaly with four-chamber enlargement. Three-vessel coronary artery disease is present. No pericardial effusion. Atherosclerotic plaque within  the normal caliber aorta. Normal 3 vessel branching of the aortic arch with moderate calcification of the proximal great vessels which are otherwise unremarkable. Central pulmonary arteries are normal caliber. Luminal evaluation precluded in the absence of contrast media. Mediastinum/Nodes: No mediastinal fluid or gas. Normal thyroid gland and thoracic inlet. No acute abnormality of the trachea. Small hiatal hernia. No other esophageal abnormality. No worrisome mediastinal or axillary adenopathy. Hilar nodal evaluation is limited in the absence of intravenous contrast media. Lungs/Pleura: There are small bilateral pleural effusions with some adjacent mild atelectatic change. There is mild biapical pleuroparenchymal scarring. Some interlobular septal thickening  is noted towards the apices and lung bases. No suspicious pulmonary nodules or masses. Few bandlike opacities towards the lung bases likely reflect atelectasis and/or scarring. Mild hyperinflation of the lungs with flattening of the diaphragms and diffuse mild airways thickening with scattered secretions likely reflecting features of chronic bronchitis. Musculoskeletal: No visible displaced rib fracture is seen at the indicated area by the radiopaque BB marker or elsewhere within the chest. No other acute osseous abnormality or suspicious osseous lesion.There is mild dextrocurvature of the thoracic spine. Slight straightening of the normal thoracic kyphosis. Multilevel degenerative changes are present in the imaged portions of the spine. Mild bilateral gynecomastia. Small probable dermal inclusion cyst along the right chest wall (2/56). CT ABDOMEN PELVIS FINDINGS Hepatobiliary: No visible direct hepatic injury or perihepatic hematoma. No focal liver abnormality is seen. Patient is post cholecystectomy. Slight prominence of the biliary tree likely related to reservoir effect. No calcified intraductal gallstones. Pancreas: Unremarkable. No pancreatic ductal dilatation or surrounding inflammatory changes. Spleen: No direct splenic injury or perisplenic hematoma. Normal splenic size. No concerning splenic lesions. Evaluation of spleen is significantly limited by extensive motion artifact. Adrenals/Urinary Tract: Lobular thickening of the left adrenal gland with a stable 1.6 cm nodule in the medial limb of the right adrenal gland which is not significantly changed from comparison studies, most compatible with an adrenal adenoma. There is asymmetric right renal atrophy likely secondary to the severe stenosis at the right renal artery ostium. There is extensive retroperitoneal stranding which is similar to comparison CT 07/15/2019 though does reflect interval change from more remote comparison exams. Unlikely to reflect an  acute traumatic injury. Retroperitoneal stranding does extend along the left ureter. Few calcifications in the renal sinus could reflect nonobstructing calculus or vascular calcium. No obstructive urolithiasis or hydronephrosis is seen in either kidney. No visible or contour deforming renal lesion. Question some mild bladder wall thickening. Stomach/Bowel: Small hiatal hernia, apparent gastric wall thickening may be related to underdistention. Duodenal sweep takes a normal course across the mid abdomen the. There are several possibly thickened loops of bowel in the left upper quadrant with surrounding edematous changes of the adjacent mesentery which is similar to slightly increased from comparison exam March 2020 though incompletely assessed in the absence of intravenous or luminal contrast. No resulting obstruction is evident. No distal small bowel dilatation or wall thickening. Postsurgical changes near the cecal tip may reflect sequela of prior appendectomy. No colonic wall thickening or dilatation. Vascular/Lymphatic: Extensive atherosclerotic calcification throughout the abdominal aorta and branch vessels including previously described critical stenosis of the right renal artery. There is a saccular 2.7 cm aneurysm arising from the 4 o'clock position of the aorta just below the origin of the renal arteries. This is unchanged in size from comparison study 07/15/2018. Further vascular evaluation is limited in the absence of intravenous contrast media. The some reactive adenopathy noted in  the mesentery and left Reproductive: Mild stranding upon the prostate and seminal vesicles, nonspecific. Other: Edematous changes in the left retroperitoneum and left mesentery, as detailed above. No free air or free fluid. No organized collection or abscess. Soft tissue thickening along the anterior abdomen is nonspecific, could reflect a cellulitis. Edematous changes noted posteriorly. Musculoskeletal: Multilevel degenerative  changes are present in the imaged portions of the spine. No acute osseous abnormality or suspicious osseous lesion. Unchanged inferior endplate deformity L5. The osseous structures appear diffusely demineralized which may limit detection of small or nondisplaced fractures. No acute osseous abnormality or suspicious osseous lesion. IMPRESSION: 1. No convincing acute traumatic abnormality of the right chest wall are elsewhere in the chest abdomen or pelvis. 2. Focal left abdominal proximal small bowel wall thickening and mesenteric stranding as well as diffuse edematous changes in the left retroperitoneum about the kidney and left ureter is similar to slightly increased from the comparison CT 07/15/2019 though does reflect a significant interval change from more remote comparison exams. Unlikely to reflect an acute traumatic injury. Consider correlation with lab values, urinalysis and possible reimaging with enteric luminal contrast to further assess the bowel wall. 3. Cardiomegaly with four-chamber enlargement. Three-vessel coronary artery disease. Additional features heart failure and volume overload with small bilateral pleural effusions with some adjacent mild atelectatic change. Some interlobular septal thickening towards the apices and lung bases is suggestive of pulmonary edema. 4. Stable 1.6 cm right adrenal adenoma. 5. Stable 2.7 cm saccular aneurysm arising from the 4 o'clock position of the aorta just below the origin of the renal arteries. This is unchanged in size from comparison study 07/15/2018. 6. Mild stranding about the prostate, correlate for features of prostatitis. 7. Aortic Atherosclerosis (ICD10-I70.0). These results were called by telephone at the time of interpretation on 08/20/2019 at 12:24 am to provider [facility] , who verbally acknowledged these results. Electronically Signed   By: Lovena Le M.D.   On: 08/20/2019 00:25   DG Chest 2 View  Result Date: 08/19/2019 CLINICAL  DATA:  Shortness of breath EXAM: CHEST - 2 VIEW COMPARISON:  07/15/2018 FINDINGS: Cardiomegaly. Mild hyperinflation of the lungs. No confluent opacities, effusions or edema. No acute bony abnormality. IMPRESSION: Cardiomegaly, hyperinflation.  No active disease. Electronically Signed   By: Rolm Baptise M.D.   On: 08/19/2019 21:52   CT Chest Wo Contrast  Result Date: 08/20/2019 CLINICAL DATA:  Right chest pain after falling against a sewing machine 4 days prior small BB marker at the site of discomfort EXAM: CT CHEST, ABDOMEN AND PELVIS WITHOUT CONTRAST TECHNIQUE: Multidetector CT imaging of the chest, abdomen and pelvis was performed following the standard protocol without IV contrast. COMPARISON:  CT abdomen pelvis 07/11/2015, 09/15/2015, 07/15/2019, same day chest radiograph FINDINGS: CT CHEST FINDINGS Cardiovascular: There is cardiomegaly with four-chamber enlargement. Three-vessel coronary artery disease is present. No pericardial effusion. Atherosclerotic plaque within the normal caliber aorta. Normal 3 vessel branching of the aortic arch with moderate calcification of the proximal great vessels which are otherwise unremarkable. Central pulmonary arteries are normal caliber. Luminal evaluation precluded in the absence of contrast media. Mediastinum/Nodes: No mediastinal fluid or gas. Normal thyroid gland and thoracic inlet. No acute abnormality of the trachea. Small hiatal hernia. No other esophageal abnormality. No worrisome mediastinal or axillary adenopathy. Hilar nodal evaluation is limited in the absence of intravenous contrast media. Lungs/Pleura: There are small bilateral pleural effusions with some adjacent mild atelectatic change. There is mild biapical pleuroparenchymal scarring. Some interlobular septal thickening  is noted towards the apices and lung bases. No suspicious pulmonary nodules or masses. Few bandlike opacities towards the lung bases likely reflect atelectasis and/or scarring. Mild  hyperinflation of the lungs with flattening of the diaphragms and diffuse mild airways thickening with scattered secretions likely reflecting features of chronic bronchitis. Musculoskeletal: No visible displaced rib fracture is seen at the indicated area by the radiopaque BB marker or elsewhere within the chest. No other acute osseous abnormality or suspicious osseous lesion.There is mild dextrocurvature of the thoracic spine. Slight straightening of the normal thoracic kyphosis. Multilevel degenerative changes are present in the imaged portions of the spine. Mild bilateral gynecomastia. Small probable dermal inclusion cyst along the right chest wall (2/56). CT ABDOMEN PELVIS FINDINGS Hepatobiliary: No visible direct hepatic injury or perihepatic hematoma. No focal liver abnormality is seen. Patient is post cholecystectomy. Slight prominence of the biliary tree likely related to reservoir effect. No calcified intraductal gallstones. Pancreas: Unremarkable. No pancreatic ductal dilatation or surrounding inflammatory changes. Spleen: No direct splenic injury or perisplenic hematoma. Normal splenic size. No concerning splenic lesions. Evaluation of spleen is significantly limited by extensive motion artifact. Adrenals/Urinary Tract: Lobular thickening of the left adrenal gland with a stable 1.6 cm nodule in the medial limb of the right adrenal gland which is not significantly changed from comparison studies, most compatible with an adrenal adenoma. There is asymmetric right renal atrophy likely secondary to the severe stenosis at the right renal artery ostium. There is extensive retroperitoneal stranding which is similar to comparison CT 07/15/2019 though does reflect interval change from more remote comparison exams. Unlikely to reflect an acute traumatic injury. Retroperitoneal stranding does extend along the left ureter. Few calcifications in the renal sinus could reflect nonobstructing calculus or vascular calcium.  No obstructive urolithiasis or hydronephrosis is seen in either kidney. No visible or contour deforming renal lesion. Question some mild bladder wall thickening. Stomach/Bowel: Small hiatal hernia, apparent gastric wall thickening may be related to underdistention. Duodenal sweep takes a normal course across the mid abdomen the. There are several possibly thickened loops of bowel in the left upper quadrant with surrounding edematous changes of the adjacent mesentery which is similar to slightly increased from comparison exam March 2020 though incompletely assessed in the absence of intravenous or luminal contrast. No resulting obstruction is evident. No distal small bowel dilatation or wall thickening. Postsurgical changes near the cecal tip may reflect sequela of prior appendectomy. No colonic wall thickening or dilatation. Vascular/Lymphatic: Extensive atherosclerotic calcification throughout the abdominal aorta and branch vessels including previously described critical stenosis of the right renal artery. There is a saccular 2.7 cm aneurysm arising from the 4 o'clock position of the aorta just below the origin of the renal arteries. This is unchanged in size from comparison study 07/15/2018. Further vascular evaluation is limited in the absence of intravenous contrast media. The some reactive adenopathy noted in the mesentery and left Reproductive: Mild stranding upon the prostate and seminal vesicles, nonspecific. Other: Edematous changes in the left retroperitoneum and left mesentery, as detailed above. No free air or free fluid. No organized collection or abscess. Soft tissue thickening along the anterior abdomen is nonspecific, could reflect a cellulitis. Edematous changes noted posteriorly. Musculoskeletal: Multilevel degenerative changes are present in the imaged portions of the spine. No acute osseous abnormality or suspicious osseous lesion. Unchanged inferior endplate deformity L5. The osseous structures  appear diffusely demineralized which may limit detection of small or nondisplaced fractures. No acute osseous abnormality or suspicious osseous lesion.  IMPRESSION: 1. No convincing acute traumatic abnormality of the right chest wall are elsewhere in the chest abdomen or pelvis. 2. Focal left abdominal proximal small bowel wall thickening and mesenteric stranding as well as diffuse edematous changes in the left retroperitoneum about the kidney and left ureter is similar to slightly increased from the comparison CT 07/15/2019 though does reflect a significant interval change from more remote comparison exams. Unlikely to reflect an acute traumatic injury. Consider correlation with lab values, urinalysis and possible reimaging with enteric luminal contrast to further assess the bowel wall. 3. Cardiomegaly with four-chamber enlargement. Three-vessel coronary artery disease. Additional features heart failure and volume overload with small bilateral pleural effusions with some adjacent mild atelectatic change. Some interlobular septal thickening towards the apices and lung bases is suggestive of pulmonary edema. 4. Stable 1.6 cm right adrenal adenoma. 5. Stable 2.7 cm saccular aneurysm arising from the 4 o'clock position of the aorta just below the origin of the renal arteries. This is unchanged in size from comparison study 07/15/2018. 6. Mild stranding about the prostate, correlate for features of prostatitis. 7. Aortic Atherosclerosis (ICD10-I70.0). These results were called by telephone at the time of interpretation on 08/20/2019 at 12:24 am to provider [facility] , who verbally acknowledged these results. Electronically Signed   By: Lovena Le M.D.   On: 08/20/2019 00:25   DG CHEST PORT 1 VIEW  Result Date: 08/21/2019 CLINICAL DATA:  Pulmonary edema, progressively worsening dyspnea, fatigue, dry cough wheezing and lower extremity edema, history hypertension, coronary artery disease post MI, smoker EXAM:  PORTABLE CHEST 1 VIEW COMPARISON:  Portable exam 0909 hours compared to 08/19/2019 FINDINGS: Mild enlargement of cardiac silhouette. Mediastinal contours and pulmonary vascularity normal. Atherosclerotic calcification aorta. Minimal bronchitic changes with subsegmental atelectasis RIGHT base. No acute infiltrate, pleural effusion or pneumothorax. Osseous structures unremarkable. Probable RIGHT nipple shadow. IMPRESSION: Bronchitic changes with subsegmental atelectasis RIGHT base. Electronically Signed   By: Lavonia Dana M.D.   On: 08/21/2019 11:02   ECHOCARDIOGRAM COMPLETE  Result Date: 08/20/2019    ECHOCARDIOGRAM REPORT   Patient Name:   Anothy E Tolbert Date of Exam: 08/20/2019 Medical Rec #:  [id number]       Height:       72.0 in Accession #:    [id number]      Weight:       196.4 lb Date of Birth:  [birth year]       BSA:          2.114 m Patient Age:    64 years        BP:           184/97 mmHg Patient Gender: M               HR:           78 bpm. Exam Location:  [facility] Procedure: 2D Echo Indications:    CHF-Acute Systolic 425.95 / G38.75  History:        Patient has prior history of Echocardiogram examinations, most                 recent 05/19/2017. CAD, Stroke, Arrythmias:Atrial Fibrillation;                 Risk Factors:Hypertension, Dyslipidemia and Current Smoker.                 Testicular cancer.  Sonographer:    Leavy Cella RDCS (AE) Referring Phys: [phone] [place]  1. Left ventricular ejection fraction, by estimation, is 50 to 55%. The left ventricle has low normal function. The left ventricle has no regional wall motion abnormalities. There is moderate concentric left ventricular hypertrophy. Left ventricular diastolic parameters are indeterminate. Elevated left ventricular end-diastolic pressure.  2. Right ventricular systolic function is normal. The right ventricular size is normal.  3. Left atrial size was severely dilated.  4. Right atrial size was mild to moderately  dilated.  5. The mitral valve is grossly normal. Mild mitral valve regurgitation.  6. The aortic valve is tricuspid. Aortic valve regurgitation is trivial. No aortic stenosis is present.  7. The inferior vena cava is dilated in size with >50% respiratory variability, suggesting right atrial pressure of 8 mmHg. FINDINGS  Left Ventricle: Left ventricular ejection fraction, by estimation, is 50 to 55%. The left ventricle has low normal function. The left ventricle has no regional wall motion abnormalities. The left ventricular internal cavity size was normal in size. There is moderate concentric left ventricular hypertrophy. Left ventricular diastolic parameters are indeterminate. Elevated left ventricular end-diastolic pressure. Right Ventricle: The right ventricular size is normal. No increase in right ventricular wall thickness. Right ventricular systolic function is normal. Left Atrium: Left atrial size was severely dilated. Right Atrium: Right atrial size was mild to moderately dilated. Pericardium: There is no evidence of pericardial effusion. Mitral Valve: The mitral valve is grossly normal. Mild mitral annular calcification. Mild mitral valve regurgitation. Tricuspid Valve: The tricuspid valve is grossly normal. Tricuspid valve regurgitation is mild. Aortic Valve: The aortic valve is tricuspid. . There is mild thickening of the aortic valve. Aortic valve regurgitation is trivial. No aortic stenosis is present. There is mild thickening of the aortic valve. Pulmonic Valve: The pulmonic valve was grossly normal. Pulmonic valve regurgitation is not visualized. Aorta: The aortic root is normal in size and structure. Venous: The inferior vena cava is dilated in size with greater than 50% respiratory variability, suggesting right atrial pressure of 8 mmHg. IAS/Shunts: No atrial level shunt detected by color flow Doppler.  LEFT VENTRICLE PLAX 2D LVIDd:         4.39 cm  Diastology LVIDs:         3.16 cm  LV e' lateral:    10.30 cm/s LV PW:         1.34 cm  LV E/e' lateral: 13.9 LV IVS:        1.48 cm  LV e' medial:    5.11 cm/s LVOT diam:     2.10 cm  LV E/e' medial:  28.0 LVOT Area:     3.46 cm  RIGHT VENTRICLE RV S prime:     13.70 cm/s TAPSE (M-mode): 1.7 cm LEFT ATRIUM              Index LA diam:        4.60 cm  2.18 cm/m LA Vol (A2C):   156.0 ml 73.78 ml/m LA Vol (A4C):   60.9 ml  28.80 ml/m LA Biplane Vol: 101.0 ml 47.77 ml/m   AORTA Ao Root diam: 3.10 cm MITRAL VALVE MV Area (PHT): 3.63 cm     SHUNTS MV Decel Time: 209 msec     Systemic Diam: 2.10 cm MV E velocity: 143.00 cm/s MV A velocity: 36.50 cm/s MV E/A ratio:  3.92 Kate Sable MD Electronically signed by Kate Sable MD Signature Date/Time: 08/20/2019/10:57:02 AM    Final      Discharge Exam: Vitals:   08/21/19 2119 08/22/19 0865  BP: (!) 177/74 (!) 158/74  Pulse: 90 79  Resp: 20 18  Temp: 98.5 F (36.9 C) 97.7 F (36.5 C)  SpO2: 95% 95%   Vitals:   08/21/19 1530 08/21/19 2031 08/21/19 2119 08/22/19 0627  BP:   (!) 177/74 (!) 158/74  Pulse:   90 79  Resp:   20 18  Temp:   98.5 F (36.9 C) 97.7 F (36.5 C)  TempSrc:    Oral  SpO2: 97% 96% 95% 95%  Weight:      Height:        General: Pt is alert, awake, not in acute distress Cardiovascular: RRR, S1/S2 +, no rubs, no gallops Respiratory: CTA bilaterally, no wheezing, no rhonchi Abdominal: Soft, NT, ND, bowel sounds + Extremities: no edema, no cyanosis    The results of significant diagnostics from this hospitalization (including imaging, microbiology, ancillary and laboratory) are listed below for reference.     Microbiology: Recent Results (from the past 240 hour(s))  Respiratory Panel by RT PCR (Flu A&B, Covid) - Nasopharyngeal Swab     Status: None   Collection Time: 08/20/19 12:33 AM   Specimen: Nasopharyngeal Swab  Result Value Ref Range Status   SARS Coronavirus 2 by RT PCR NEGATIVE NEGATIVE Final    Comment: (NOTE) SARS-CoV-2 target nucleic acids are  NOT DETECTED. The SARS-CoV-2 RNA is generally detectable in upper respiratoy specimens during the acute phase of infection. The lowest concentration of SARS-CoV-2 viral copies this assay can detect is 131 copies/mL. A negative result does not preclude SARS-Cov-2 infection and should not be used as the sole basis for treatment or other patient management decisions. A negative result may occur with  improper specimen collection/handling, submission of specimen other than nasopharyngeal swab, presence of viral mutation(s) within the areas targeted by this assay, and inadequate number of viral copies (<131 copies/mL). A negative result must be combined with clinical observations, patient history, and epidemiological information. The expected result is Negative. Fact Sheet for Patients:  PinkCheek.be Fact Sheet for Healthcare Providers:  GravelBags.it This test is not yet ap proved or cleared by the [place] FDA and  has been authorized for detection and/or diagnosis of SARS-CoV-2 by FDA under an Emergency Use Authorization (EUA). This EUA will remain  in effect (meaning this test can be used) for the duration of the COVID-19 declaration under Section 564(b)(1) of the Act, 21 U.S.C. section 360bbb-3(b)(1), unless the authorization is terminated or revoked sooner.    Influenza A by PCR NEGATIVE NEGATIVE Final   Influenza B by PCR NEGATIVE NEGATIVE Final    Comment: (NOTE) The Xpert Xpress SARS-CoV-2/FLU/RSV assay is intended as an aid in  the diagnosis of influenza from Nasopharyngeal swab specimens and  should not be used as a sole basis for treatment. Nasal washings and  aspirates are unacceptable for Xpert Xpress SARS-CoV-2/FLU/RSV  testing. Fact Sheet for Patients: PinkCheek.be Fact Sheet for Healthcare Providers: GravelBags.it This test is not yet approved or  cleared by the [place] FDA and  has been authorized for detection and/or diagnosis of SARS-CoV-2 by  FDA under an Emergency Use Authorization (EUA). This EUA will remain  in effect (meaning this test can be used) for the duration of the  Covid-19 declaration under Section 564(b)(1) of the Act, 21  U.S.C. section 360bbb-3(b)(1), unless the authorization is  terminated or revoked. Performed at [facility], [address], [place], [place] [address]      Labs: BNP (last 3 results) Recent Labs  08/19/19 2231  BNP 161.0*   Basic Metabolic Panel: Recent Labs  Lab 08/19/19 2225 08/21/19 0457 08/22/19 0444  NA 138 139 138  K 4.6 4.3 3.7  CL 106 107 105  CO2 21* 23 22  GLUCOSE 112* 147* 97  BUN 25* 42* 46*  CREATININE 2.48* 2.89* 3.04*  CALCIUM 9.2 9.1 8.5*  MG  --  2.7*  --    Liver Function Tests: Recent Labs  Lab 08/19/19 2225  AST 15  ALT 16  ALKPHOS 78  BILITOT 0.6  PROT 7.2  ALBUMIN 3.5   No results for input(s): LIPASE, AMYLASE in the last 168 hours. No results for input(s): AMMONIA in the last 168 hours. CBC: Recent Labs  Lab 08/19/19 2225 08/21/19 0457  WBC 9.0 12.3*  NEUTROABS 6.7  --   HGB 12.6* 12.1*  HCT 37.8* 37.1*  MCV 94.3 92.8  PLT 154 165   Cardiac Enzymes: No results for input(s): CKTOTAL, CKMB, CKMBINDEX, TROPONINI in the last 168 hours. BNP: Invalid input(s): POCBNP CBG: No results for input(s): GLUCAP in the last 168 hours. D-Dimer No results for input(s): DDIMER in the last 72 hours. Hgb A1c No results for input(s): HGBA1C in the last 72 hours. Lipid Profile No results for input(s): CHOL, HDL, LDLCALC, TRIG, CHOLHDL, LDLDIRECT in the last 72 hours. Thyroid function studies No results for input(s): TSH, T4TOTAL, T3FREE, THYROIDAB in the last 72 hours.  Invalid input(s): FREET3 Anemia work up No results for input(s): VITAMINB12, FOLATE, FERRITIN, TIBC, IRON, RETICCTPCT in the last 72 hours. Urinalysis    Component  Value Date/Time   COLORURINE YELLOW 07/09/2015 1600   APPEARANCEUR CLEAR 07/09/2015 1600   LABSPEC 1.014 07/09/2015 1600   PHURINE 7.5 07/09/2015 1600   GLUCOSEU 250 (A) 07/09/2015 1600   HGBUR TRACE (A) 07/09/2015 1600   BILIRUBINUR NEGATIVE 07/09/2015 1600   KETONESUR NEGATIVE 07/09/2015 1600   PROTEINUR 100 (A) 07/09/2015 1600   NITRITE NEGATIVE 07/09/2015 1600   LEUKOCYTESUR NEGATIVE 07/09/2015 1600   Sepsis Labs Invalid input(s): PROCALCITONIN,  WBC,  LACTICIDVEN Microbiology Recent Results (from the past 240 hour(s))  Respiratory Panel by RT PCR (Flu A&B, Covid) - Nasopharyngeal Swab     Status: None   Collection Time: 08/20/19 12:33 AM   Specimen: Nasopharyngeal Swab  Result Value Ref Range Status   SARS Coronavirus 2 by RT PCR NEGATIVE NEGATIVE Final    Comment: (NOTE) SARS-CoV-2 target nucleic acids are NOT DETECTED. The SARS-CoV-2 RNA is generally detectable in upper respiratoy specimens during the acute phase of infection. The lowest concentration of SARS-CoV-2 viral copies this assay can detect is 131 copies/mL. A negative result does not preclude SARS-Cov-2 infection and should not be used as the sole basis for treatment or other patient management decisions. A negative result may occur with  improper specimen collection/handling, submission of specimen other than nasopharyngeal swab, presence of viral mutation(s) within the areas targeted by this assay, and inadequate number of viral copies (<131 copies/mL). A negative result must be combined with clinical observations, patient history, and epidemiological information. The expected result is Negative. Fact Sheet for Patients:  PinkCheek.be Fact Sheet for Healthcare Providers:  GravelBags.it This test is not yet ap proved or cleared by the [place] FDA and  has been authorized for detection and/or diagnosis of SARS-CoV-2 by FDA under an Emergency Use  Authorization (EUA). This EUA will remain  in effect (meaning this test can be used) for the duration of the COVID-19 declaration under Section 564(b)(1)  of the Act, 21 U.S.C. section 360bbb-3(b)(1), unless the authorization is terminated or revoked sooner.    Influenza A by PCR NEGATIVE NEGATIVE Final   Influenza B by PCR NEGATIVE NEGATIVE Final    Comment: (NOTE) The Xpert Xpress SARS-CoV-2/FLU/RSV assay is intended as an aid in  the diagnosis of influenza from Nasopharyngeal swab specimens and  should not be used as a sole basis for treatment. Nasal washings and  aspirates are unacceptable for Xpert Xpress SARS-CoV-2/FLU/RSV  testing. Fact Sheet for Patients: PinkCheek.be Fact Sheet for Healthcare Providers: GravelBags.it This test is not yet approved or cleared by the [place] FDA and  has been authorized for detection and/or diagnosis of SARS-CoV-2 by  FDA under an Emergency Use Authorization (EUA). This EUA will remain  in effect (meaning this test can be used) for the duration of the  Covid-19 declaration under Section 564(b)(1) of the Act, 21  U.S.C. section 360bbb-3(b)(1), unless the authorization is  terminated or revoked. Performed at [facility], [address], [place],  [address]      Time coordinating discharge: 35 minutes  SIGNED:   Rodena Goldmann, DO [facility] 08/22/2019, 9:45 AM  If 7PM-7AM, please contact night-coverage [email or web address]

## 2019-08-26 ENCOUNTER — Other Ambulatory Visit: Payer: Self-pay | Admitting: Cardiology

## 2019-08-28 ENCOUNTER — Other Ambulatory Visit: Payer: Self-pay

## 2019-08-28 NOTE — Patient Outreach (Signed)
[facility]) Care Management  08/28/2019  [name] [name] [birth year] [id number]   Red Emmi: Reason for alert:  Sad/hopeless/empty- yes  Placed call to patient and reviewed reason for call. Patient reports to me that he is not feeling depression or sad or hopeless.   Patient reports that he is doing well. Denies any other concerns today.  PLAN: will close case as no needs identified.  Tomasa Rand, RN, BSN, CEN [facility] [employer] [phone]

## 2019-08-30 ENCOUNTER — Other Ambulatory Visit: Payer: Self-pay

## 2019-08-30 ENCOUNTER — Ambulatory Visit (INDEPENDENT_AMBULATORY_CARE_PROVIDER_SITE_OTHER): Payer: Medicare Other

## 2019-08-30 DIAGNOSIS — Z5181 Encounter for therapeutic drug level monitoring: Secondary | ICD-10-CM | POA: Diagnosis not present

## 2019-08-30 DIAGNOSIS — I4891 Unspecified atrial fibrillation: Secondary | ICD-10-CM

## 2019-08-30 LAB — POCT INR: INR: 2.9 (ref 2.0–3.0)

## 2019-08-30 NOTE — Patient Instructions (Signed)
Description   Continue warfarin 1 tablet daily except 1 1/2 tablets on Tuesdays and Fridays Recheck in 4 wks

## 2019-08-31 ENCOUNTER — Ambulatory Visit (INDEPENDENT_AMBULATORY_CARE_PROVIDER_SITE_OTHER): Payer: Medicare Other | Admitting: Family Medicine

## 2019-08-31 ENCOUNTER — Encounter: Payer: Self-pay | Admitting: Family Medicine

## 2019-08-31 VITALS — BP 150/82 | HR 66 | Temp 97.9°F | Resp 16 | Ht 72.0 in | Wt 196.0 lb

## 2019-08-31 DIAGNOSIS — N184 Chronic kidney disease, stage 4 (severe): Secondary | ICD-10-CM

## 2019-08-31 DIAGNOSIS — L723 Sebaceous cyst: Secondary | ICD-10-CM

## 2019-08-31 DIAGNOSIS — I2510 Atherosclerotic heart disease of native coronary artery without angina pectoris: Secondary | ICD-10-CM

## 2019-08-31 DIAGNOSIS — I739 Peripheral vascular disease, unspecified: Secondary | ICD-10-CM | POA: Diagnosis not present

## 2019-08-31 DIAGNOSIS — E782 Mixed hyperlipidemia: Secondary | ICD-10-CM | POA: Diagnosis not present

## 2019-08-31 DIAGNOSIS — I5042 Chronic combined systolic (congestive) and diastolic (congestive) heart failure: Secondary | ICD-10-CM

## 2019-08-31 DIAGNOSIS — I10 Essential (primary) hypertension: Secondary | ICD-10-CM | POA: Diagnosis not present

## 2019-08-31 DIAGNOSIS — R609 Edema, unspecified: Secondary | ICD-10-CM

## 2019-08-31 DIAGNOSIS — D2321 Other benign neoplasm of skin of right ear and external auricular canal: Secondary | ICD-10-CM

## 2019-08-31 NOTE — Patient Instructions (Addendum)
COVID Vaccination Information As of right now, we will not be giving COVID-19 vaccines here in our office. It is too many storage and administrating regulations that our office is not equipped to provide at this time.    You can go online at [email or web address]   That website will give you information on all counties.    If not here are the numbers you can call. [place] or [place] [phone] opt.2 [facility] Department [phone]   You can also find information on [email or web address] or call the state's COVID-19 information phone number at 211.   You can also find information in regards to local pharmacies at: [email or web address]  [facility]~ [phone]  Referral to general surgery for removal  F/U  4 months for Physocal

## 2019-08-31 NOTE — Progress Notes (Signed)
Subjective:    Patient ID: [name] [name], male    DOB: 1950/07/19, 69 y.o.   MRN: [id number]  Patient presents for Hospital F/u (COPD Exacerbation)  Patient here for hospital follow-up.  His last visit with April 2020   He was admitted to the hospital secondary to COPD exacerbation along with acute diastolic heart failure exacerbation.  He was given IV Lasix to treat the heart failure and diuresed almost 5 L.  Due to renal intolerance this have to be reduced.  He is now back on his home dose of Lasix which is  COPD exacerbation he was given Mucinex this along with nebulizers in the hospital.  He did not have COVID-19 infection.  Hypertension he was maintained on his regular medications  Atrial fibrillation he did not have RVR during hospitalization he is anticoagulated with warfarin which is followed by cardiology  He is due for repeat metabolic panel next baseline creatinine is 2-2.2 his creatinine peaked at 3 in the hospital,he is followed by Nephrology   Since his last visit he did have gallbladder removed in May 2020  Cyst behind right ear > 1 year would like to have removed, and cyst on right lower back, is umcomfortable at times   Review Of Systems:  GEN- denies fatigue, fever, weight loss,weakness, recent illness HEENT- denies eye drainage, change in vision, nasal discharge, CVS- denies chest pain, palpitations RESP- denies SOB, cough, wheeze ABD- denies N/V, change in stools, abd pain GU- denies dysuria, hematuria, dribbling, incontinence MSK- denies joint pain, muscle aches, injury Neuro- denies headache, dizziness, syncope, seizure activity       Objective:    BP (!) 150/82   Pulse 66   Temp 97.9 F (36.6 C) (Temporal)   Resp 16   Ht 6' (1.829 m)   Wt 196 lb (88.9 kg)   SpO2 97%   BMI 26.58 kg/m  GEN- NAD, alert and oriented x3 HEENT- PERRL, EOMI, non injected sclera, pink conjunctiva, MMM, oropharynx clear Neck- Supple, no thyromegaly CVS- RRR, no  murmur RESP-CTAB ABD-NABS,soft,NT,ND Skin- Cystic lesion behind right ear, cyst Right lower back  EXT-1+ bilat  edema Pulses- Radial, DP- 2+        Assessment & Plan:      Problem List Items Addressed This Visit      Unprioritized   CAD - s/p PCI to Cx. 100% RCA CTO (Chronic)    He would benefit from statin drug, based on lipid panel start lipitor 20mg  at bedtime       Relevant Orders   Lipid panel (Completed)   Chronic combined systolic and diastolic CHF (congestive heart failure) (HCC)    Per above renal function is 2.68 today, now on lasix 40mg  once a day  Defer to cardiology  Script given for compression hose       Chronic kidney disease   Relevant Orders   CBC with Differential/Platelet (Completed)   Comprehensive metabolic panel (Completed)   Essential hypertension (Chronic)    BP still elevated, but pt also has a good amount of fluid on him Will get him scheduled with cardiology outpatient, this was not set up when he left the hospital He feels swelling is much improved Concern with meds is his renal function pushing the lasix dose       Relevant Orders   CBC with Differential/Platelet (Completed)   Comprehensive metabolic panel (Completed)   Hyperlipidemia - Primary (Chronic)   PERIPHERAL VASCULAR DISEASE   Relevant Orders  Lipid panel (Completed)    Other Visit Diagnoses    Peripheral edema       Dermoid cyst of right ear       Relevant Orders   Ambulatory referral to General Surgery   Sebaceous cyst lower back        Relevant Orders   Ambulatory referral to General Surgery      Note: This dictation was prepared with Dragon dictation along with smaller phrase technology. Any transcriptional errors that result from this process are unintentional.

## 2019-09-01 LAB — COMPREHENSIVE METABOLIC PANEL
AG Ratio: 1.2 (calc) (ref 1.0–2.5)
ALT: 12 U/L (ref 9–46)
AST: 11 U/L (ref 10–35)
Albumin: 3.6 g/dL (ref 3.6–5.1)
Alkaline phosphatase (APISO): 87 U/L (ref 35–144)
BUN/Creatinine Ratio: 13 (calc) (ref 6–22)
BUN: 36 mg/dL — ABNORMAL HIGH (ref 7–25)
CO2: 19 mmol/L — ABNORMAL LOW (ref 20–32)
Calcium: 9.2 mg/dL (ref 8.6–10.3)
Chloride: 108 mmol/L (ref 98–110)
Creat: 2.68 mg/dL — ABNORMAL HIGH (ref 0.70–1.25)
Globulin: 2.9 g/dL (calc) (ref 1.9–3.7)
Glucose, Bld: 94 mg/dL (ref 65–99)
Potassium: 4.9 mmol/L (ref 3.5–5.3)
Sodium: 138 mmol/L (ref 135–146)
Total Bilirubin: 0.5 mg/dL (ref 0.2–1.2)
Total Protein: 6.5 g/dL (ref 6.1–8.1)

## 2019-09-01 LAB — LIPID PANEL
Cholesterol: 203 mg/dL — ABNORMAL HIGH (ref <200)
HDL: 34 mg/dL — ABNORMAL LOW (ref >=40)
LDL Cholesterol (Calc): 140 mg/dL (calc) — ABNORMAL HIGH
Non-HDL Cholesterol (Calc): 169 mg/dL (calc) — ABNORMAL HIGH (ref <130)
Total CHOL/HDL Ratio: 6 (calc) — ABNORMAL HIGH (ref <5.0)
Triglycerides: 154 mg/dL — ABNORMAL HIGH (ref <150)

## 2019-09-01 LAB — CBC WITH DIFFERENTIAL/PLATELET
Absolute Monocytes: 662 cells/uL (ref 200–950)
Basophils Absolute: 43 cells/uL (ref 0–200)
Basophils Relative: 0.6 %
Eosinophils Absolute: 101 cells/uL (ref 15–500)
Eosinophils Relative: 1.4 %
HCT: 37.7 % — ABNORMAL LOW (ref 38.5–50.0)
Hemoglobin: 12.7 g/dL — ABNORMAL LOW (ref 13.2–17.1)
Lymphs Abs: 1152 cells/uL (ref 850–3900)
MCH: 30 pg (ref 27.0–33.0)
MCHC: 33.7 g/dL (ref 32.0–36.0)
MCV: 89.1 fL (ref 80.0–100.0)
MPV: 11.1 fL (ref 7.5–12.5)
Monocytes Relative: 9.2 %
Neutro Abs: 5242 cells/uL (ref 1500–7800)
Neutrophils Relative %: 72.8 %
Platelets: 187 10*3/uL (ref 140–400)
RBC: 4.23 10*6/uL (ref 4.20–5.80)
RDW: 13.3 % (ref 11.0–15.0)
Total Lymphocyte: 16 %
WBC: 7.2 10*3/uL (ref 3.8–10.8)

## 2019-09-02 MED ORDER — ATORVASTATIN CALCIUM 20 MG PO TABS: 20.0000 mg | ORAL_TABLET | Freq: Every day | ORAL | 3 refills | Status: DC

## 2019-09-02 NOTE — Assessment & Plan Note (Addendum)
Per above renal function is 2.68 today, now on lasix 40mg  once a day  Defer to cardiology  Script given for compression hose

## 2019-09-02 NOTE — Assessment & Plan Note (Signed)
He would benefit from statin drug, based on lipid panel start lipitor 20mg  at bedtime

## 2019-09-02 NOTE — Assessment & Plan Note (Signed)
BP still elevated, but pt also has a good amount of fluid on him Will get him scheduled with cardiology outpatient, this was not set up when he left the hospital He feels swelling is much improved Concern with meds is his renal function pushing the lasix dose

## 2019-09-03 ENCOUNTER — Other Ambulatory Visit: Payer: Self-pay | Admitting: *Deleted

## 2019-09-03 MED ORDER — ATORVASTATIN CALCIUM 20 MG PO TABS: 20.0000 mg | ORAL_TABLET | Freq: Every day | ORAL | 3 refills | Status: DC

## 2019-09-04 ENCOUNTER — Telehealth: Payer: Self-pay | Admitting: *Deleted

## 2019-09-04 ENCOUNTER — Other Ambulatory Visit: Payer: Self-pay | Admitting: Cardiology

## 2019-09-04 NOTE — Telephone Encounter (Signed)
-----   Message from Arnoldo Lenis, MD sent at 09/03/2019  3:35 PM EDT ----- He needs a hospital f/u, can we arrange with the PA at [facility] in 1-2 weeks   Zandra Abts MD

## 2019-09-04 NOTE — Telephone Encounter (Signed)
Pt scheduled for 5/19 with Estella Husk, PA - pt aware

## 2019-09-13 ENCOUNTER — Ambulatory Visit (INDEPENDENT_AMBULATORY_CARE_PROVIDER_SITE_OTHER): Payer: Medicare Other | Admitting: General Surgery

## 2019-09-13 ENCOUNTER — Encounter: Payer: Self-pay | Admitting: General Surgery

## 2019-09-13 ENCOUNTER — Other Ambulatory Visit: Payer: Self-pay

## 2019-09-13 VITALS — BP 155/72 | HR 66 | Temp 97.9°F | Ht 72.0 in | Wt 195.0 lb

## 2019-09-13 DIAGNOSIS — L723 Sebaceous cyst: Secondary | ICD-10-CM | POA: Diagnosis not present

## 2019-09-13 NOTE — Patient Instructions (Signed)
Epidermal Cyst  An epidermal cyst is a small, painless lump under your skin. The cyst contains a grayish-white, bad-smelling substance (keratin). Do not try to pop or open an epidermal cyst yourself. What are the causes?  A blocked hair follicle.  A hair that curls and re-enters the skin instead of growing straight out of the skin.  A blocked pore.  Irritated skin.  An injury to the skin.  Certain conditions that are passed along from parent to child (inherited).  Human papillomavirus (HPV).  Long-term sun damage to the skin. What increases the risk?  Having acne.  Being overweight.  Being 30-40 years old. What are the signs or symptoms? These cysts are usually harmless, but they can get infected. Symptoms of infection may include:  Redness.  Inflammation.  Tenderness.  Warmth.  Fever.  A grayish-white, bad-smelling substance drains from the cyst.  Pus drains from the cyst. How is this treated? In many cases, epidermal cysts go away on their own without treatment. If a cyst becomes infected, treatment may include:  Opening and draining the cyst, done by a doctor. After draining, you may need minor surgery to remove the rest of the cyst.  Antibiotic medicine.  Shots of medicines (steroids) that help to reduce inflammation.  Surgery to remove the cyst. Surgery may be done if the cyst: ? Becomes large. ? Bothers you. ? Has a chance of turning into cancer.  Do not try to open a cyst yourself. Follow these instructions at home:  Take over-the-counter and prescription medicines only as told by your doctor.  If you were prescribed an antibiotic medicine, take it it as told by your doctor. Do not stop using the antibiotic even if you start to feel better.  Keep the area around your cyst clean and dry.  Wear loose, dry clothing.  Avoid touching your cyst.  Check your cyst every day for signs of infection. Check for: ? Redness, swelling, or pain. ? Fluid  or blood. ? Warmth. ? Pus or a bad smell.  Keep all follow-up visits as told by your doctor. This is important. How is this prevented?  Wear clean, dry, clothing.  Avoid wearing tight clothing.  Keep your skin clean and dry. Take showers or baths every day. Contact a doctor if:  Your cyst has symptoms of infection.  Your condition does not improve or gets worse.  You have a cyst that looks different from other cysts you have had.  You have a fever. Get help right away if:  Redness spreads from the cyst into the area close by. Summary  An epidermal cyst is a sac made of skin tissue.  If a cyst becomes infected, treatment may include surgery to open and drain the cyst, or to remove it.  Take over-the-counter and prescription medicines only as told by your doctor.  Contact a doctor if your condition is not improving or is getting worse.  Keep all follow-up visits as told by your doctor. This is important. This information is not intended to replace advice given to you by your health care provider. Make sure you discuss any questions you have with your health care provider. Document Revised: 08/10/2018 Document Reviewed: 01/26/2018 Elsevier Patient Education  2020 [employer].  

## 2019-09-13 NOTE — Progress Notes (Addendum)
Cardiology Office Note    Date:  09/19/2019   ID:  DEMITRIS POKORNY, DOB 1951-01-09, MRN [id number]  PCP:  Alycia Rossetti, MD  Cardiologist: Carlyle Dolly, MD EPS: None  Chief Complaint  Patient presents with  . Hospitalization Follow-up    History of Present Illness:  [name] [name] is a 69 y.o. male with chronic Afib on coumadin, CAD stent OM1 and CTO RCA-remote cath [facility], HTN, PAD, history of ICM EF 35-40% 2017, 08% with diastolic dysfunction 10/5782,ONG, CKD-stage 4, AAA repair with LRA bypass 2017  Discharged 08/22/19 after being treated with acute on chronic CHF-Diuresed 4.9L.,echo 08/20/2019 normal LVEF 50 to 55%, moderate LVH indeterminate diastolic function,  COPD exacerbation, AKI.  We did not see during this hospitalization.  Patient comes in for hospital f/u. Breathing difficult if he does anything. Quit smoking a year ago. Wearing compression hose. Trying to watch salt. Quit smoking a year ago.  Going deep sea fishing in June and his daughters get married in July.  He has not been vaccinated for COVID-19.  Past Medical History:  Diagnosis Date  . AAA (abdominal aortic aneurysm) ([place])    a. s/p repair 06/2015 with left renal artery bypass at that time, post-op course c/b renal failure requiring dialysis, C dif.  . Anemia   . Arteriosclerotic cardiovascular disease (ASCVD)    a. AMI in 2000 treated at [facility]. b. cath in 12/2006->  Chronic total obstruction of the RCA;  drug-eluting stent placed in OM1, LVEF abnormal.  . Cerebrovascular disease 2002   carotid stent  . Chronic anticoagulation   . Chronic combined systolic and diastolic CHF (congestive heart failure) ([place])   . CKD (chronic kidney disease), stage IV ([place])   . GERD (gastroesophageal reflux disease)   . Hyperlipidemia   . Hypertension   . Myocardial infarction ([place]) 10 yrs ago  . Nephrolithiasis   . Permanent atrial fibrillation ([place])   . PVD (peripheral vascular disease) ([place])    Ct angiogram in  2009 revealed stable disease with 80% celiac stenosis,50% right renal artery ,ASCVD with ulceration in the abdominal aortashe  . Testicular carcinoma ([place]) 1990   right orchiectomy  . Tobacco abuse, in remission    20 pack years; quit in 2009    Past Surgical History:  Procedure Laterality Date  . ABDOMINAL AORTIC ANEURYSM REPAIR N/A 06/09/2015   Procedure: ANEURYSM ABDOMINAL AORTIC REPAIR;  Surgeon: Elam Dutch, MD;  Location: [facility];  Service: Vascular;  Laterality: N/A;  . AORTIC ENDARTERECETOMY N/A 06/09/2015   Procedure: AORTIC ENDARTERECETOMY;  Surgeon: Elam Dutch, MD;  Location: [place];  Service: Vascular;  Laterality: N/A;  . AORTIC/RENAL BYPASS Left 06/09/2015   Procedure: LEFT RENAL Artery BYPASS;  Surgeon: Elam Dutch, MD;  Location: [place];  Service: Vascular;  Laterality: Left;  . APPENDECTOMY  2004  . CHOLECYSTECTOMY N/A 09/18/2018   Procedure: LAPAROSCOPIC CHOLECYSTECTOMY;  Surgeon: Aviva Signs, MD;  Location: [facility] ORS;  Service: General;  Laterality: N/A;  . COLONOSCOPY  06/17/2011   INCOMPLETE, PREP POOR. Procedure: COLONOSCOPY;  Surgeon: Daneil Dolin, MD;  Location: [facility] ENDO SU[facility];  Service: Endoscopy;  Laterality: N/A;  10:00  . COLONOSCOPY  07/15/2011   EXB:[id number] rectal and colon polyps  . COLONOSCOPY N/A 05/22/2015   WNU:[id number] colonic and rectal polyps. tubular adenomas.repeat TCS 05/2018  . ESOPHAGEAL DILATION N/A 05/22/2015   Procedure: ESOPHAGEAL DILATION;  Surgeon: Daneil Dolin, MD;  [facility];  Service: Endoscopy;  Laterality: N/A;  . ESOPHAGOGASTRODUODENOSCOPY  06/17/2011   severe erosive/ulcerative reflux esophagitis, soft noncritical stricture dilatied, small hh, antral erosion   . ESOPHAGOGASTRODUODENOSCOPY N/A 05/22/2015   RMR: 2 cm HH otherwise normal. s/p empirical dilation.  . INSERTION OF DIALYSIS CATHETER Left 06/26/2015   Procedure: INSERTION OF DIALYSIS CATHETER;  Surgeon: Angelia Mould, MD;  Location: [place];  Service:  Vascular;  Laterality: Left;  . PERIPHERAL VASCULAR CATHETERIZATION N/A 05/28/2015   Procedure: Abdominal Aortogram;  Surgeon: Serafina Mitchell, MD;  Location: [place] CV LAB;  Service: Cardiovascular;  Laterality: N/A;  . testicular cancer  1990   right orchiectomy    Current Medications: Current Meds  Medication Sig  . albuterol (VENTOLIN HFA) 108 (90 Base) MCG/ACT inhaler Inhale 2-4 puffs into the lungs every 4 (four) hours as needed for wheezing or shortness of breath.  [name] amLODipine (NORVASC) 10 MG tablet Take 1 tablet by mouth once daily (Patient taking differently: Take 10 mg by mouth daily. take 1 tablet by mouth once daily)  . atorvastatin (LIPITOR) 20 MG tablet Take 1 tablet (20 mg total) by mouth daily.  [name] BESIVANCE 0.6 % SUSP Place 1 drop into the left eye 4 (four) times daily.   . calcitRIOL (ROCALTROL) 0.5 MCG capsule Take 0.25 mcg by mouth daily.   . Ferrous Sulfate (IRON) 325 (65 Fe) MG TABS TAKE 1 TABLET BY MOUTH THREE TIMES DAILY WITH MEALS (Patient taking differently: Take 325 mg by mouth in the morning, at noon, and at bedtime. )  . fluticasone (FLONASE) 50 MCG/ACT nasal spray Place 1 spray into both nostrils daily as needed for allergies or rhinitis.  . furosemide (LASIX) 40 MG tablet Take 0.5-1 tablets (20-40 mg total) by mouth See admin instructions. Take 40 mg by mouth every other day, alternating with 20 mg  . hydrALAZINE (APRESOLINE) 25 MG tablet TAKE 1 & 1/2 (ONE & ONE-HALF) TABLETS BY MOUTH THREE TIMES DAILY  . metoprolol succinate (TOPROL-XL) 100 MG 24 hr tablet TAKE 1 & 1/2 (ONE & ONE-HALF) TABLETS BY MOUTH ONCE DAILY (Patient taking differently: Take 150 mg by mouth daily. )  . nitroGLYCERIN (NITROSTAT) 0.4 MG SL tablet place 1 tablet under the tongue if needed every 5 minutes for chest pain for 3 doses IF NO RELIEF AFTER 3RD DOSE CALL PRESCRIBER OR 911. (Patient taking differently: Place 0.4 mg under the tongue every 5 (five) minutes as needed for chest pain.  place 1 tablet under the tongue if needed every 5 minutes for chest pain for 3 doses IF NO RELIEF AFTER 3RD DOSE CALL PRESCRIBER OR 911.)  . ondansetron (ZOFRAN) 4 MG tablet Take 1 tablet (4 mg total) by mouth every 8 (eight) hours as needed for nausea or vomiting.  . pantoprazole (PROTONIX) 40 MG tablet Take 1 tablet by mouth once daily (Patient taking differently: Take 40 mg by mouth daily. )  . tamsulosin (FLOMAX) 0.4 MG CAPS capsule Take 1 capsule (0.4 mg total) by mouth daily after supper.  . warfarin (COUMADIN) 5 MG tablet Take 1 tablet daily except 1 1/2 tablets on Tuesdays and Fridays (Patient taking differently: Take 5 mg by mouth See admin instructions. Take 5 mg daily except for 7.5 mg on Tuesdays and Fridays)     Allergies:   Patient has no known allergies.   Social History   Socioeconomic History  . Marital status: Married    Spouse name: Not on file  . Number of children: 2  . Years of  education: Not on file  . Highest education level: Not on file  Occupational History  . Occupation: disabled    Fish farm manager: UNEMPLOYED  Tobacco Use  . Smoking status: Former Smoker    Packs/day: 0.50    Years: 40.00    Pack years: 20.00    Types: Cigarettes    Quit date: 03/02/2019    Years since quitting: 0.5  . Smokeless tobacco: Never Used  . Tobacco comment: 1/2 pack daily  Substance and Sexual Activity  . Alcohol use: No    Alcohol/week: 0.0 standard drinks  . Drug use: No  . Sexual activity: Yes    Birth control/protection: None  Other Topics Concern  . Not on file  Social History Narrative  . Not on file   Social Determinants of Health   Financial Resource Strain:   . Difficulty of Paying Living Expenses:   Food Insecurity:   . Worried About Charity fundraiser in the Last Year:   . Arboriculturist in the Last Year:   Transportation Needs:   . Film/video editor (Medical):   [name] Lack of Transportation (Non-Medical):   Physical Activity:   . Days of Exercise per  Week:   . Minutes of Exercise per Session:   Stress:   . Feeling of Stress :   Social Connections:   . Frequency of Communication with Friends and Family:   . Frequency of Social Gatherings with Friends and Family:   . Attends Religious Services:   . Active Member of Clubs or Organizations:   . Attends Archivist Meetings:   [name] Marital Status:      Family History:  The patient's   family history includes Colon cancer (age of onset: 53) in his father; Heart disease in his mother; Prostate cancer in his father.   ROS:   Please see the history of present illness.    ROS All other systems reviewed and are negative.   PHYSICAL EXAM:   VS:  BP (!) 142/84   Pulse 76   Ht 6' (1.829 m)   Wt 191 lb (86.6 kg)   SpO2 97%   BMI 25.90 kg/m   Physical Exam  GEN: Well nourished, well developed, in no acute distress  Neck: no JVD, carotid bruits, or masses Cardiac: Irregular irregular; no murmurs, rubs, or gallops  Respiratory: Decreased breath sounds with scattered wheezes throughout  GI: soft, nontender, nondistended, + BS Ext: without cyanosis, clubbing, or edema, Good distal pulses bilaterally Neuro:  Alert and Oriented x 3 Psych: euthymic mood, full affect  Wt Readings from Last 3 Encounters:  09/19/19 191 lb (86.6 kg)  09/13/19 195 lb (88.5 kg)  08/31/19 196 lb (88.9 kg)      Studies/Labs Reviewed:   EKG:  EKG is not ordered today.    Recent Labs: 08/19/2019: B Natriuretic Peptide 532.0 08/21/2019: Magnesium 2.7 08/31/2019: ALT 12; BUN 36; Creat 2.68; Hemoglobin 12.7; Platelets 187; Potassium 4.9; Sodium 138   Lipid Panel    Component Value Date/Time   CHOL 203 (H) 08/31/2019 1431   TRIG 154 (H) 08/31/2019 1431   HDL 34 (L) 08/31/2019 1431   CHOLHDL 6.0 (H) 08/31/2019 1431   VLDL 52 (H) 12/28/2016 1017   LDLCALC 140 (H) 08/31/2019 1431    Additional studies/ records that were reviewed today include:  Echo 08/20/19 IMPRESSIONS     1. Left ventricular  ejection fraction, by estimation, is 50 to 55%. The  left ventricle has low  normal function. The left ventricle has no regional  wall motion abnormalities. There is moderate concentric left ventricular  hypertrophy. Left ventricular  diastolic parameters are indeterminate. Elevated left ventricular  end-diastolic pressure.   2. Right ventricular systolic function is normal. The right ventricular  size is normal.   3. Left atrial size was severely dilated.   4. Right atrial size was mild to moderately dilated.   5. The mitral valve is grossly normal. Mild mitral valve regurgitation.   6. The aortic valve is tricuspid. Aortic valve regurgitation is trivial.  No aortic stenosis is present.   7. The inferior vena cava is dilated in size with >50% respiratory  variability, suggesting right atrial pressure of 8 mmHg.      ASSESSMENT:    1. Permanent atrial fibrillation ([place])   2. Chronic diastolic CHF (congestive heart failure) ([place])   3. Ischemic cardiomyopathy   4. Coronary artery disease involving native coronary artery of native heart without angina pectoris   5. Essential hypertension   6. Hyperlipidemia, unspecified hyperlipidemia type   7. CKD (chronic kidney disease) stage 4, GFR 15-29 ml/min (H[facility])   8. Educated about COVID-19 virus infection      PLAN:  In order of problems listed above:  Permanent Afib on Coumadin and metoprolol no changes  Chronic diastolic CHF with recent exacerbation-echo 08/20/19 LVEF 50-55% mod LVH -wearing compression stockings taking Lasix 40 mg alternating with 20 mg every other day.  Creatinine 2.68 08/31/2019 which was better in the hospital at 3.04  History of ischemic CM now normal LVEF  CAD-remote stent OM1 and CTO RCA [place] no angina  HTN blood pressure controlled  HLD goal <70  LDL 140-08/31/19 on increased lipitor 20 mg followed by PCP  CKD stage 4 Crt 2.68 08/31/19  COVID-19 education advised patient should get vaccinated with his  underlying medical conditions and plans for his daughter's wedding and deep sea fishing with his son and friends.  He says he will go get it [facility]  Medication Adjustments/Labs and Tests Ordered: Current medicines are reviewed at length with the patient today.  Concerns regarding medicines are outlined above.  Medication changes, Labs and Tests ordered today are listed in the Patient Instructions below. Patient Instructions  Medication Instructions:  Your physician recommends that you continue on your current medications as directed. Please refer to the Current Medication list given to you today.  *If you need a refill on your cardiac medications before your next appointment, please call your pharmacy*   Lab Work: NONE   If you have labs (blood work) drawn today and your tests are completely normal, you will receive your results only by: [name] MyChart Message (if you have MyChart) OR . A paper copy in the mail If you have any lab test that is abnormal or we need to change your treatment, we will call you to review the results.   Testing/Procedures: NONE   Follow-Up: At [facility], you and your health needs are our priority.  As part of our continuing mission to provide you with exceptional heart care, we have created designated Provider Care Teams.  These Care Teams include your primary Cardiologist (physician) and Advanced Practice Providers (APPs -  Physician Assistants and Nurse Practitioners) who all work together to provide you with the care you need, when you need it.  We recommend signing up for the patient portal called "MyChart".  Sign up information is provided on this After Visit Summary.  MyChart is used  to connect with patients for Virtual Visits (Telemedicine).  Patients are able to view lab/test results, encounter notes, upcoming appointments, etc.  Non-urgent messages can be sent to your provider as well.   To learn more about what you can do with MyChart, go to  [email or web address].    Your next appointment:   3 month(s)  The format for your next appointment:   In Person  Provider:   Carlyle Dolly, MD   Other Instructions Thank you for choosing [facility]!       Sumner Boast, PA-C  09/19/2019 1:30 PM    [facility] [place], [place], [place]  [address] Phone: [phone]; Fax: [phone]

## 2019-09-13 NOTE — Progress Notes (Signed)
Subjective:     [name] [name]  Patient is a 69 year old white male who was referred back to my care by Dr. Buelah Manis for a cyst behind his right ear.  He states it is present for some time.  No recent growth has been noted.  He just wanted me to look at it.  No drainage has been noted.  He currently has 0 out of 10 pain. Objective:    BP (!) 155/72   Pulse 66   Temp 97.9 F (36.6 C)   Ht 6' (1.829 m)   Wt 195 lb (88.5 kg)   SpO2 96%   BMI 26.45 kg/m   General:  alert, cooperative and no distress  Head is normocephalic, atraumatic.  A 5 to 7 mm raised cystic lesion is noted posterior to the right ear.  It is mobile.  No significant skin changes are noted above it.  It may contain some adipose tissue. Lungs are clear to auscultation with equal breath sounds bilaterally Heart examination reveals an irregular rate and rhythm. Skin: A 2 cm sebaceous cyst is noted in the right lower back.  It is not fluctuant.  No drainage is noted.  A punctum is present.    Assessment:    Benign cystic lesion, right posterior auricular   Sebaceous cyst, right lower back Multiple medical problems including chronic anticoagulation for atrial fibrillation   Plan:  I told the patient that this appears to be benign in nature.  Should it increase in size or causing more discomfort, I would be more than happy to excise it.  He would have to stop his Coumadin prior to any surgical intervention and I would like to avoid doing anything elective due to his comorbidities unless absolutely necessary.  He understands this and agrees.  Follow-up here as needed.

## 2019-09-19 ENCOUNTER — Other Ambulatory Visit: Payer: Self-pay

## 2019-09-19 ENCOUNTER — Ambulatory Visit (INDEPENDENT_AMBULATORY_CARE_PROVIDER_SITE_OTHER): Payer: Medicare Other | Admitting: Physician Assistant

## 2019-09-19 ENCOUNTER — Encounter: Payer: Self-pay | Admitting: Physician Assistant

## 2019-09-19 VITALS — BP 142/84 | HR 76 | Ht 72.0 in | Wt 191.0 lb

## 2019-09-19 DIAGNOSIS — Z7189 Other specified counseling: Secondary | ICD-10-CM

## 2019-09-19 DIAGNOSIS — I255 Ischemic cardiomyopathy: Secondary | ICD-10-CM | POA: Diagnosis not present

## 2019-09-19 DIAGNOSIS — I10 Essential (primary) hypertension: Secondary | ICD-10-CM | POA: Diagnosis not present

## 2019-09-19 DIAGNOSIS — I2510 Atherosclerotic heart disease of native coronary artery without angina pectoris: Secondary | ICD-10-CM

## 2019-09-19 DIAGNOSIS — I4821 Permanent atrial fibrillation: Secondary | ICD-10-CM | POA: Diagnosis not present

## 2019-09-19 DIAGNOSIS — N184 Chronic kidney disease, stage 4 (severe): Secondary | ICD-10-CM

## 2019-09-19 DIAGNOSIS — E785 Hyperlipidemia, unspecified: Secondary | ICD-10-CM

## 2019-09-19 DIAGNOSIS — I5032 Chronic diastolic (congestive) heart failure: Secondary | ICD-10-CM

## 2019-09-19 NOTE — Patient Instructions (Signed)
Medication Instructions:  Your physician recommends that you continue on your current medications as directed. Please refer to the Current Medication list given to you today.  *If you need a refill on your cardiac medications before your next appointment, please call your pharmacy*   Lab Work: NONE   If you have labs (blood work) drawn today and your tests are completely normal, you will receive your results only by: . MyChart Message (if you have MyChart) OR . A paper copy in the mail If you have any lab test that is abnormal or we need to change your treatment, we will call you to review the results.   Testing/Procedures: NONE    Follow-Up: At [facility], you and your health needs are our priority.  As part of our continuing mission to provide you with exceptional heart care, we have created designated Provider Care Teams.  These Care Teams include your primary Cardiologist (physician) and Advanced Practice Providers (APPs -  Physician Assistants and Nurse Practitioners) who all work together to provide you with the care you need, when you need it.  We recommend signing up for the patient portal called "MyChart".  Sign up information is provided on this After Visit Summary.  MyChart is used to connect with patients for Virtual Visits (Telemedicine).  Patients are able to view lab/test results, encounter notes, upcoming appointments, etc.  Non-urgent messages can be sent to your provider as well.   To learn more about what you can do with MyChart, go to [email or web address].    Your next appointment:   3 month(s)  The format for your next appointment:   In Person  Provider:   Jonathan Branch, MD   Other Instructions Thank you for choosing [facility]!    

## 2019-09-24 ENCOUNTER — Other Ambulatory Visit: Payer: Self-pay | Admitting: Family Medicine

## 2019-09-27 ENCOUNTER — Ambulatory Visit (INDEPENDENT_AMBULATORY_CARE_PROVIDER_SITE_OTHER): Payer: Medicare Other | Admitting: *Deleted

## 2019-09-27 ENCOUNTER — Other Ambulatory Visit: Payer: Self-pay

## 2019-09-27 DIAGNOSIS — Z5181 Encounter for therapeutic drug level monitoring: Secondary | ICD-10-CM

## 2019-09-27 DIAGNOSIS — I4891 Unspecified atrial fibrillation: Secondary | ICD-10-CM | POA: Diagnosis not present

## 2019-09-27 LAB — POCT INR: INR: 2.7 (ref 2.0–3.0)

## 2019-09-27 NOTE — Patient Instructions (Signed)
Continue warfarin 1 tablet daily except 1 1/2 tablets on Tuesdays and Fridays Recheck in 6 wks 

## 2019-10-07 ENCOUNTER — Other Ambulatory Visit: Payer: Self-pay | Admitting: Family Medicine

## 2019-10-16 ENCOUNTER — Encounter (HOSPITAL_COMMUNITY): Payer: Self-pay | Admitting: Emergency Medicine

## 2019-10-16 ENCOUNTER — Emergency Department (HOSPITAL_COMMUNITY)
Admission: EM | Admit: 2019-10-16 | Discharge: 2019-10-16 | Disposition: A | Discharge status: Home / Self Care | Payer: Medicare Other | Attending: Emergency Medicine | Admitting: Emergency Medicine

## 2019-10-16 ENCOUNTER — Other Ambulatory Visit: Payer: Self-pay

## 2019-10-16 ENCOUNTER — Emergency Department (HOSPITAL_COMMUNITY): Payer: Medicare Other

## 2019-10-16 DIAGNOSIS — R0602 Shortness of breath: Secondary | ICD-10-CM | POA: Diagnosis not present

## 2019-10-16 DIAGNOSIS — I5043 Acute on chronic combined systolic (congestive) and diastolic (congestive) heart failure: Secondary | ICD-10-CM | POA: Insufficient documentation

## 2019-10-16 DIAGNOSIS — Z79899 Other long term (current) drug therapy: Secondary | ICD-10-CM | POA: Insufficient documentation

## 2019-10-16 DIAGNOSIS — J984 Other disorders of lung: Secondary | ICD-10-CM | POA: Diagnosis not present

## 2019-10-16 DIAGNOSIS — Z7951 Long term (current) use of inhaled steroids: Secondary | ICD-10-CM | POA: Diagnosis not present

## 2019-10-16 DIAGNOSIS — I130 Hypertensive heart and chronic kidney disease with heart failure and stage 1 through stage 4 chronic kidney disease, or unspecified chronic kidney disease: Secondary | ICD-10-CM | POA: Diagnosis not present

## 2019-10-16 DIAGNOSIS — Z20822 Contact with and (suspected) exposure to covid-19: Secondary | ICD-10-CM | POA: Insufficient documentation

## 2019-10-16 DIAGNOSIS — Z992 Dependence on renal dialysis: Secondary | ICD-10-CM | POA: Insufficient documentation

## 2019-10-16 DIAGNOSIS — Z8547 Personal history of malignant neoplasm of testis: Secondary | ICD-10-CM | POA: Diagnosis not present

## 2019-10-16 DIAGNOSIS — Z7901 Long term (current) use of anticoagulants: Secondary | ICD-10-CM | POA: Diagnosis not present

## 2019-10-16 DIAGNOSIS — N184 Chronic kidney disease, stage 4 (severe): Secondary | ICD-10-CM | POA: Diagnosis not present

## 2019-10-16 DIAGNOSIS — J90 Pleural effusion, not elsewhere classified: Secondary | ICD-10-CM | POA: Diagnosis not present

## 2019-10-16 DIAGNOSIS — Z87891 Personal history of nicotine dependence: Secondary | ICD-10-CM | POA: Diagnosis not present

## 2019-10-16 DIAGNOSIS — J441 Chronic obstructive pulmonary disease with (acute) exacerbation: Secondary | ICD-10-CM | POA: Diagnosis not present

## 2019-10-16 DIAGNOSIS — I110 Hypertensive heart disease with heart failure: Secondary | ICD-10-CM | POA: Diagnosis not present

## 2019-10-16 HISTORY — DX: Chronic obstructive pulmonary disease, unspecified: J44.9

## 2019-10-16 LAB — COMPREHENSIVE METABOLIC PANEL
ALT: 17 U/L (ref 0–44)
AST: 19 U/L (ref 15–41)
Albumin: 3.7 g/dL (ref 3.5–5.0)
Alkaline Phosphatase: 107 U/L (ref 38–126)
Anion gap: 12 (ref 5–15)
BUN: 24 mg/dL — ABNORMAL HIGH (ref 8–23)
CO2: 20 mmol/L — ABNORMAL LOW (ref 22–32)
Calcium: 9.4 mg/dL (ref 8.9–10.3)
Chloride: 106 mmol/L (ref 98–111)
Creatinine, Ser: 2.94 mg/dL — ABNORMAL HIGH (ref 0.61–1.24)
GFR calc Af Amer: 24 mL/min — ABNORMAL LOW (ref >60)
GFR calc non Af Amer: 21 mL/min — ABNORMAL LOW (ref >60)
Glucose, Bld: 129 mg/dL — ABNORMAL HIGH (ref 70–99)
Potassium: 3.9 mmol/L (ref 3.5–5.1)
Sodium: 138 mmol/L (ref 135–145)
Total Bilirubin: 0.5 mg/dL (ref 0.3–1.2)
Total Protein: 8.1 g/dL (ref 6.5–8.1)

## 2019-10-16 LAB — CBC
HCT: 40.2 % (ref 39.0–52.0)
Hemoglobin: 13.1 g/dL (ref 13.0–17.0)
MCH: 29.5 pg (ref 26.0–34.0)
MCHC: 32.6 g/dL (ref 30.0–36.0)
MCV: 90.5 fL (ref 80.0–100.0)
Platelets: 182 10*3/uL (ref 150–400)
RBC: 4.44 MIL/uL (ref 4.22–5.81)
RDW: 14.2 % (ref 11.5–15.5)
WBC: 9 10*3/uL (ref 4.0–10.5)
nRBC: 0 % (ref 0.0–0.2)

## 2019-10-16 LAB — PROTIME-INR
INR: 3.2 — ABNORMAL HIGH (ref 0.8–1.2)
Prothrombin Time: 32.1 seconds — ABNORMAL HIGH (ref 11.4–15.2)

## 2019-10-16 LAB — BRAIN NATRIURETIC PEPTIDE: B Natriuretic Peptide: 559 pg/mL — ABNORMAL HIGH (ref 0.0–100.0)

## 2019-10-16 MED ORDER — ALBUTEROL SULFATE HFA 108 (90 BASE) MCG/ACT IN AERS
6.0000 | INHALATION_SPRAY | Freq: Once | RESPIRATORY_TRACT | Status: AC
  Administered 2019-10-16: 6 via RESPIRATORY_TRACT
  Filled 2019-10-16: qty 6.7

## 2019-10-16 MED ORDER — PREDNISONE 50 MG PO TABS
60.0000 mg | ORAL_TABLET | Freq: Once | ORAL | Status: AC
  Administered 2019-10-16: 60 mg via ORAL
  Filled 2019-10-16: qty 1

## 2019-10-16 MED ORDER — IPRATROPIUM-ALBUTEROL 20-100 MCG/ACT IN AERS
2.0000 | INHALATION_SPRAY | Freq: Once | RESPIRATORY_TRACT | Status: AC
  Administered 2019-10-16: 2 via RESPIRATORY_TRACT
  Filled 2019-10-16: qty 4

## 2019-10-16 MED ORDER — FUROSEMIDE 10 MG/ML IJ SOLN
40.0000 mg | Freq: Once | INTRAMUSCULAR | Status: AC
  Administered 2019-10-16: 40 mg via INTRAVENOUS
  Filled 2019-10-16: qty 4

## 2019-10-16 MED ORDER — IPRATROPIUM BROMIDE HFA 17 MCG/ACT IN AERS
2.0000 | INHALATION_SPRAY | Freq: Once | RESPIRATORY_TRACT | Status: DC
  Filled 2019-10-16: qty 12.9

## 2019-10-16 MED ORDER — IPRATROPIUM BROMIDE 0.02 % IN SOLN: 0.5000 mg | Freq: Once | RESPIRATORY_TRACT | Status: DC

## 2019-10-16 MED ORDER — DOXYCYCLINE HYCLATE 100 MG PO CAPS
100.0000 mg | ORAL_CAPSULE | Freq: Two times a day (BID) | ORAL | 0 refills | Status: AC
Start: 2019-10-16 — End: 2019-10-23

## 2019-10-16 NOTE — ED Notes (Signed)
Pt ambulated in room. O2 stayed between 94%-95% while ambulating.

## 2019-10-16 NOTE — ED Triage Notes (Signed)
Pt complains of SOB that began today and has gotten progressively worse.

## 2019-10-16 NOTE — Discharge Instructions (Addendum)
Please take antibiotics, as directed.  Please watch for evidence of bleeding as you are at increased risk when combining doxycyline with your warfarin.  Please watch for dark stools.  You may continue to use your albuterol at home as well as the ipratropium (Combivent) as needed.  Continue take your at home furosemide medications.  It is vitally important that you follow-up with your primary care provider tomorrow regarding today's encounter.  Please maintain isolation precautions pending results of your COVID-19 testing.  Return to the ED or seek immediate medical attention should you experience any new or worsening symptoms.

## 2019-10-16 NOTE — ED Provider Notes (Signed)
[facility] EMERGENCY DEPARTMENT Provider Note   CSN: [id number] Arrival date & time: 10/16/19  1523     History Chief Complaint  Patient presents with  . Shortness of Breath    [name] [name] is a 69 y.o. male with PMH of HTN, HLD, CKD, combined CHF, COPD, tobacco use, PVD, AAA, ACS, and atrial fibrillation on warfarin who presents to the ED with a 1 day history of progressively worsening shortness of breath symptoms.  I reviewed patient medical record and he was recently admitted to the hospital and discharged 08/22/2019 for acute diastolic CHF exacerbation that responded well with IV diuresis.  Patient states that symptoms began at approximately 5 PM yesterday and have been getting increasingly worse.  On my examination, he does demonstrate increased work of breathing.  He is on 2 L supplemental O2 via [place].  Patient is also endorsing a nonproductive cough.  He does not typically require oxygen at home.  He stopped smoking cigarettes approximately 1 year ago, but states that his wife "smokes a Economist".  On my examination, he is wheezing diffusely.  He denies any obvious aggravating or alleviating factors.  He has been taking his furosemide, as directed.  He tried using albuterol this morning which did not relieve or improve his symptoms.  He denies any recent illness, fevers or chills, hemoptysis, sore throat, chest pain, abdominal pain, nausea or vomiting, diminished appetite, urinary symptoms, or changes in the labs.  HPI     Past Medical History:  Diagnosis Date  . AAA (abdominal aortic aneurysm) ([place])    a. s/p repair 06/2015 with left renal artery bypass at that time, post-op course c/b renal failure requiring dialysis, C dif.  . Anemia   . Arteriosclerotic cardiovascular disease (ASCVD)    a. AMI in 2000 treated at [facility] b. cath in 12/2006->  Chronic total obstruction of the RCA;  drug-eluting stent placed in OM1, LVEF abnormal.  . Cerebrovascular disease 2002   carotid stent  .  Chronic anticoagulation   . Chronic combined systolic and diastolic CHF (congestive heart failure) ([place])   . CKD (chronic kidney disease), stage IV ([place])   . COPD (chronic obstructive pulmonary disease) ([place])   . GERD (gastroesophageal reflux disease)   . Hyperlipidemia   . Hypertension   . Myocardial infarction ([place]) 10 yrs ago  . Nephrolithiasis   . Permanent atrial fibrillation ([place])   . PVD (peripheral vascular disease) ([place])    Ct angiogram in 2009 revealed stable disease with 80% celiac stenosis,50% right renal artery ,ASCVD with ulceration in the abdominal aortashe  . Testicular carcinoma ([place]) 1990   right orchiectomy  . Tobacco abuse, in remission    20 pack years; quit in 2009    Patient Active Problem List   Diagnosis Date Noted  . Chronic combined systolic and diastolic CHF (congestive heart failure) ([place]) 08/20/2019  . Paroxysmal A-fib ([place]) 08/20/2019  . Prolonged QT interval 08/20/2019  . COPD exacerbation ([place])   . Follow-up examination following surgery 09/28/2018  . Calculus of gallbladder with acute cholecystitis without obstruction   . Status post abdominal aortic aneurysm (AAA) repair   . History of CVA with residual deficit   . Pseudoaneurysm of aorta ([place]) 06/09/2015  . CAD - s/p PCI to Cx. 100% RCA CTO 05/28/2015  . Hiatal hernia   . History of colonic polyps   . Hx of adenomatous colonic polyps 05/14/2015  . Constipation 04/22/2015  . Insomnia 07/16/2014  . ED (erectile dysfunction)  06/17/2013  . Chronic kidney disease 09/28/2011  . GERD (gastroesophageal reflux disease) 06/01/2011  . Family history of colon cancer 05/20/2011  . Chronic anticoagulation 09/24/2010  . Tobacco abuse   . ATHEROSCLEROTIC CARDIOVASCULAR DISEASE 11/13/2009  . PERIPHERAL VASCULAR DISEASE 11/13/2009  . Hyperlipidemia 10/24/2008  . Testicular cancer ([place]) 10/22/2008  . Essential hypertension 10/22/2008    Past Surgical History:  Procedure Laterality Date  . ABDOMINAL  AORTIC ANEURYSM REPAIR N/A 06/09/2015   Procedure: ANEURYSM ABDOMINAL AORTIC REPAIR;  Surgeon: Elam Dutch, MD;  Location: [facility] OR;  Service: Vascular;  Laterality: N/A;  . AORTIC ENDARTERECETOMY N/A 06/09/2015   Procedure: AORTIC ENDARTERECETOMY;  Surgeon: Elam Dutch, MD;  Location: [place];  Service: Vascular;  Laterality: N/A;  . AORTIC/RENAL BYPASS Left 06/09/2015   Procedure: LEFT RENAL Artery BYPASS;  Surgeon: Elam Dutch, MD;  Location: [place];  Service: Vascular;  Laterality: Left;  . APPENDECTOMY  2004  . CHOLECYSTECTOMY N/A 09/18/2018   Procedure: LAPAROSCOPIC CHOLECYSTECTOMY;  Surgeon: Aviva Signs, MD;  Location: [facility] ORS;  Service: General;  Laterality: N/A;  . COLONOSCOPY  06/17/2011   INCOMPLETE, PREP POOR. Procedure: COLONOSCOPY;  Surgeon: Daneil Dolin, MD;  Location: [facility] ENDO SUITE;  Service: Endoscopy;  Laterality: N/A;  10:00  . COLONOSCOPY  07/15/2011   BHA:[id number] rectal and colon polyps  . COLONOSCOPY N/A 05/22/2015   OXB:[id number] colonic and rectal polyps. tubular adenomas.repeat TCS 05/2018  . ESOPHAGEAL DILATION N/A 05/22/2015   Procedure: ESOPHAGEAL DILATION;  Surgeon: Daneil Dolin, MD;  Location: [facility] END[facility];  Service: Endoscopy;  Laterality: N/A;  . ESOPHAGOGASTRODUODENOSCOPY  06/17/2011   severe erosive/ulcerative reflux esophagitis, soft noncritical stricture dilatied, small hh, antral erosion   . ESOPHAGOGASTRODUODENOSCOPY N/A 05/22/2015   RMR: 2 cm HH otherwise normal. s/p empirical dilation.  . INSERTION OF DIALYSIS CATHETER Left 06/26/2015   Procedure: INSERTION OF DIALYSIS CATHETER;  Surgeon: Angelia Mould, MD;  Location: [place];  Service: Vascular;  Laterality: Left;  . PERIPHERAL VASCULAR CATHETERIZATION N/A 05/28/2015   Procedure: Abdominal Aortogram;  Surgeon: Serafina Mitchell, MD;  Location: [place] CV LAB;  Service: Cardiovascular;  Laterality: N/A;  . testicular cancer  1990   right orchiectomy       Family History  Problem Relation  Age of Onset  . Colon cancer Father 71       deceased  . Prostate cancer Father   . Heart disease Mother   . Liver disease Neg Hx   . Anesthesia problems Neg Hx   . Hypotension Neg Hx   . Malignant hyperthermia Neg Hx   . Pseudochol deficiency Neg Hx     Social History   Tobacco Use  . Smoking status: Former Smoker    Packs/day: 0.50    Years: 40.00    Pack years: 20.00    Types: Cigarettes    Quit date: 03/02/2019    Years since quitting: 0.6  . Smokeless tobacco: Never Used  . Tobacco comment: 1/2 pack daily  Vaping Use  . Vaping Use: Never used  Substance Use Topics  . Alcohol use: No    Alcohol/week: 0.0 standard drinks  . Drug use: No    Home Medications Prior to Admission medications   Medication Sig Start Date End Date Taking? Authorizing Provider  albuterol (VENTOLIN HFA) 108 (90 Base) MCG/ACT inhaler Inhale 2-4 puffs into the lungs every 4 (four) hours as needed for wheezing or shortness of breath. 04/06/19  Yes  [place], [place],  MD  amLODipine (NORVASC) 10 MG tablet Take 1 tablet by mouth once daily Patient taking differently: Take 10 mg by mouth daily.  10/08/19  Yes [place], Modena Nunnery, MD  atorvastatin (LIPITOR) 20 MG tablet Take 1 tablet (20 mg total) by mouth daily. 09/02/19  Yes [place], Modena Nunnery, MD  calcitRIOL (ROCALTROL) 0.25 MCG capsule Take 0.25 mcg by mouth daily.    Yes [provider]  Ferrous Sulfate (IRON) 325 (65 Fe) MG TABS TAKE 1 TABLET BY MOUTH THREE TIMES DAILY WITH MEALS Patient taking differently: Take 325 mg by mouth in the morning, at noon, and at bedtime.  08/13/19  Yes [place], Modena Nunnery, MD  fluticasone [facility]) 50 MCG/ACT nasal spray Place 1 spray into both nostrils daily as needed for allergies or rhinitis.   Yes [provider]  furosemide (LASIX) 40 MG tablet Take 0.5-1 tablets (20-40 mg total) by mouth See admin instructions. Take 40 mg by mouth every other day, alternating with 20 mg 09/05/19  Yes Branch, Alphonse Guild, MD    hydrALAZINE (APRESOLINE) 25 MG tablet TAKE 1 & 1/2 (ONE & ONE-HALF) TABLETS BY MOUTH THREE TIMES DAILY Patient taking differently: Take 37.5 mg by mouth 3 (three) times daily.  08/27/19  Yes Branch, Alphonse Guild, MD  metoprolol succinate (TOPROL-XL) 100 MG 24 hr tablet TAKE 1 & 1/2 (ONE & ONE-HALF) TABLETS BY MOUTH ONCE DAILY Patient taking differently: Take 150 mg by mouth daily.  02/06/19  Yes Branch, Alphonse Guild, MD  nitroGLYCERIN (NITROSTAT) 0.4 MG SL tablet place 1 tablet under the tongue if needed every 5 minutes for chest pain for 3 doses IF NO RELIEF AFTER 3RD DOSE CALL PRESCRIBER OR 911. Patient taking differently: Place 0.4 mg under the tongue every 5 (five) minutes as needed for chest pain. place 1 tablet under the tongue if needed every 5 minutes for chest pain for 3 doses IF NO RELIEF AFTER 3RD DOSE CALL PRESCRIBER OR 911. 10/25/16  Yes Lendon Colonel, NP  ondansetron (ZOFRAN) 4 MG tablet Take 1 tablet (4 mg total) by mouth every 8 (eight) hours as needed for nausea or vomiting. 08/28/18  Yes [place], Modena Nunnery, MD  pantoprazole (PROTONIX) 40 MG tablet Take 1 tablet by mouth once daily Patient taking differently: Take 40 mg by mouth daily.  09/24/19  Yes Loyal, Modena Nunnery, MD  tamsulosin (FLOMAX) 0.4 MG CAPS capsule Take 1 capsule (0.4 mg total) by mouth daily after supper. 06/11/19  Yes [place], Modena Nunnery, MD  warfarin (COUMADIN) 5 MG tablet Take 1 tablet daily except 1 1/2 tablets on Tuesdays and Fridays Patient taking differently: Take 5 mg by mouth See admin instructions. Take 5 mg daily except for 7.5 mg on Tuesdays and Fridays 04/02/19  Yes Branch, Alphonse Guild, MD    Allergies    Patient has no known allergies.  Review of Systems   Review of Systems  All other systems reviewed and are negative.   Physical Exam Updated Vital Signs BP (!) 180/70   Pulse 93   Temp 98.2 F (36.8 C) (Oral)   Resp (!) 24   Ht 6' (1.829 m)   Wt 86.6 kg   SpO2 96%   BMI 25.90 kg/m   Physical  Exam Vitals and nursing note reviewed. Exam conducted with a chaperone present.  Constitutional:      General: He is not in acute distress.    Appearance: He is ill-appearing.  HENT:     Head: Normocephalic and atraumatic.  Eyes:     General: No scleral icterus.    Conjunctiva/sclera: Conjunctivae normal.  Cardiovascular:     Rate and Rhythm: Normal rate and regular rhythm.     Pulses: Normal pulses.     Heart sounds: Normal heart sounds.  Pulmonary:     Comments: Mildly increased work of breathing.  Tachypneic.  Wheezing that is audible even before auscultation.  Breath sounds intact bilaterally. Musculoskeletal:     Cervical back: Normal range of motion. No rigidity.     Comments: Mild 1+ pitting edema bilaterally.  Skin:    General: Skin is dry.     Capillary Refill: Capillary refill takes less than 2 seconds.  Neurological:     Mental Status: He is alert and oriented to person, place, and time.     GCS: GCS eye subscore is 4. GCS verbal subscore is 5. GCS motor subscore is 6.  Psychiatric:        Mood and Affect: Mood normal.        Behavior: Behavior normal.        Thought Content: Thought content normal.     ED Results / Procedures / Treatments   Labs (all labs ordered are listed, but only abnormal results are displayed) Labs Reviewed  PROTIME-INR - Abnormal; Notable for the following components:      Result Value   Prothrombin Time 32.1 (*)    INR 3.2 (*)    All other components within normal limits  COMPREHENSIVE METABOLIC PANEL - Abnormal; Notable for the following components:   CO2 20 (*)    Glucose, Bld 129 (*)    BUN 24 (*)    Creatinine, Ser 2.94 (*)    GFR calc non Af Amer 21 (*)    GFR calc Af Amer 24 (*)    All other components within normal limits  BRAIN NATRIURETIC PEPTIDE - Abnormal; Notable for the following components:   B Natriuretic Peptide 559.0 (*)    All other components within normal limits  SARS CORONAVIRUS 2 BY RT PCR (HOSPITAL ORDER,  [place] [facility])  CBC    EKG None  Radiology DG Chest 2 View  Result Date: 10/16/2019 CLINICAL DATA:  Shortness of breath. EXAM: CHEST - 2 VIEW COMPARISON:  August 21, 2019. FINDINGS: Stable cardiomediastinal silhouette. No pneumothorax or pleural effusion is noted. Both lungs are clear. The visualized skeletal structures are unremarkable. IMPRESSION: No active cardiopulmonary disease. Electronically Signed   By: Marijo Conception M.D.   On: 10/16/2019 17:08    Procedures Procedures (including critical care time)  Medications Ordered in ED Medications  predniSONE (DELTASONE) tablet 60 mg (60 mg Oral Given 10/16/19 1911)  albuterol (VENTOLIN HFA) 108 (90 Base) MCG/ACT inhaler 6 puff (6 puffs Inhalation Given 10/16/19 1911)  furosemide (LASIX) injection 40 mg (40 mg Intravenous Given 10/16/19 2140)  Ipratropium-Albuterol (COMBIVENT) respimat 2 puff (2 puffs Inhalation Given 10/16/19 2319)    ED Course  I have reviewed the triage vital signs and the nursing notes.  Pertinent labs & imaging results that were available during my care of the patient were reviewed by me and considered in my medical decision making (see chart for details).    MDM Rules/Calculators/A&P                          Patient's history and physical exam is consistent with a COPD exacerbation.  We will treat with prednisone and albuterol here  in the ED.  He is requiring 2 L supplemental O2 which is a new requirement for him.  He had documented hypoxia at 1 point here in the ED.  Patient was planning on receiving his COVID-19 vaccinations today, but could not go due to illness.  No obvious sick contacts.  On reexamination, patient was feeling significantly improved, however was still tachypneic with wheezing appreciated diffusely.  He was now accompanied by his wife who is also pleased by his improvement here in the ED.  Given that he still is wheezing and tachypneic, will give patient 2 puffs  ipratropium in addition to 40 mg IV furosemide diuresis for his elevated BNP to 559.0.    On reevaluation, patient feels almost entirely improved and is prepared to go home.  While he is still borderline given his COPD exacerbation also in context of CHF involvement, he is significantly improved from when he first arrived to the ED and is now breathing comfortably and without tachypnea.  His O2 sats have been persistently WNL without any supplemental oxygen.  We will ambulate the patient on pulse oximetry and assess for any desaturation or increased work of breathing.  If not, patient will be safe for discharge with strict instructions to follow-up with his primary care provider regarding today's encounter.  Patient did have mildly purulent sputum here in the ED, will add azithromycin 500 mg p.o. daily x5 days.  Patient was successfully ambulated here in the ED without any hypoxia or increased work of breathing.  Strict ED return precautions discussed.  All of the evaluation and work-up results were discussed with the patient and any family at bedside. They were provided opportunity to ask any additional questions and have none at this time. They have expressed understanding of verbal discharge instructions as well as return precautions and are agreeable to the plan.   [name] [name] was evaluated in Emergency Department on 10/16/2019 for the symptoms described in the history of present illness. He was evaluated in the context of the global COVID-19 pandemic, which necessitated consideration that the patient might be at risk for infection with the SARS-CoV-2 virus that causes COVID-19. Institutional protocols and algorithms that pertain to the evaluation of patients at risk for COVID-19 are in a state of rapid change based on information released by regulatory bodies including the CDC and federal and state organizations. These policies and algorithms were followed during the patient's care in the ED.  Final  Clinical Impression(s) / ED Diagnoses Final diagnoses:  COPD exacerbation ([place])  Acute on chronic combined systolic and diastolic congestive heart failure [facility])    Rx / DC Orders ED Discharge Orders    None       Corena Herter, PA-C 10/16/19 2336    Milton Ferguson, MD 10/17/19 1013

## 2019-10-17 ENCOUNTER — Ambulatory Visit (INDEPENDENT_AMBULATORY_CARE_PROVIDER_SITE_OTHER): Payer: Medicare Other | Admitting: Family Medicine

## 2019-10-17 ENCOUNTER — Encounter: Payer: Self-pay | Admitting: Family Medicine

## 2019-10-17 VITALS — BP 150/78 | HR 78 | Temp 97.8°F | Resp 16 | Ht 72.0 in | Wt 188.0 lb

## 2019-10-17 DIAGNOSIS — I5042 Chronic combined systolic (congestive) and diastolic (congestive) heart failure: Secondary | ICD-10-CM | POA: Diagnosis not present

## 2019-10-17 DIAGNOSIS — N184 Chronic kidney disease, stage 4 (severe): Secondary | ICD-10-CM | POA: Diagnosis not present

## 2019-10-17 DIAGNOSIS — J441 Chronic obstructive pulmonary disease with (acute) exacerbation: Secondary | ICD-10-CM

## 2019-10-17 LAB — SARS CORONAVIRUS 2 BY RT PCR (HOSPITAL ORDER, PERFORMED IN ~~LOC~~ HOSPITAL LAB): SARS Coronavirus 2: NEGATIVE

## 2019-10-17 MED ORDER — PREDNISONE 10 MG PO TABS
ORAL_TABLET | ORAL | 0 refills | Status: DC
Start: 2019-10-17 — End: 2019-12-28

## 2019-10-17 MED ORDER — ALBUTEROL SULFATE (2.5 MG/3ML) 0.083% IN NEBU
2.5000 mg | INHALATION_SOLUTION | Freq: Once | RESPIRATORY_TRACT | Status: AC
  Administered 2019-10-17: 2.5 mg via RESPIRATORY_TRACT

## 2019-10-17 MED ORDER — ALBUTEROL SULFATE (2.5 MG/3ML) 0.083% IN NEBU
2.5000 mg | INHALATION_SOLUTION | RESPIRATORY_TRACT | 1 refills | Status: DC | PRN
Start: 2019-10-17 — End: 2020-04-03

## 2019-10-17 MED ORDER — METHYLPREDNISOLONE ACETATE 40 MG/ML IJ SUSP
40.0000 mg | Freq: Once | INTRAMUSCULAR | Status: AC
  Administered 2019-10-17: 40 mg via INTRAMUSCULAR

## 2019-10-17 NOTE — Assessment & Plan Note (Addendum)
Depo-Medrol 40 mg IM given in the office.  He was also given albuterol neb.  He improved significantly after the neb.  His bronchospasm pretty much resolved he had scattered wheeze and his work of breathing was much improved.    He will complete the antibiotics I will add prednisone taper.  Prescription  given for nebulizer - albuterol nebs every 4 hours as needed Change to mucinex DM or Coricidan for cough   if he does not improve he should go back to the emergency room.  O2 sat looks okay at 94% here in the office.

## 2019-10-17 NOTE — Patient Instructions (Addendum)
Take the prednisone taper starting in the morning Use mucinex DM for cough and congestion or Coricidan Use nebulizer every 4 hours as needed Go to ER if you do not improve  F/U 3 months

## 2019-10-17 NOTE — Assessment & Plan Note (Signed)
Does not appear to be fluid overloaded I will continue him on his current dose of Lasix in the setting of his chronic kidney disease

## 2019-10-17 NOTE — Progress Notes (Signed)
   Subjective:    Patient ID: [name] [name], male    DOB: Oct 15, 1950, 69 y.o.   MRN: [id number]  Patient presents for ER F/U (COPD/ CHF)   Pt here for ER follow up, for COPD/CHF.  He was seen in the emergency room after he had progressive shortness of breath.  Started with cough and congestion and then worsened over 24 hours.  Chest x-ray was unremarkable however he did have elevated BNP.  His renal function was about his baseline at two-point.  He had one episode of hypoxia when he was in the emergency room however he states that the blood pressure cuff was on at the same time as his O2 sat.  I reviewed the ER notes.  They diuresed him with IV Lasix he was given 1 dose of prednisone 60 mg both Dr. Loni Muse prescription to go home with.  He was given a nebulizer treatment with some improvement.  He was prescribed doxycycline which he has taken 1 tablet.  Comparison to our last visit in April weight is down 8 pounds.  Once a day.    Review Of Systems:  GEN- denies fatigue, fever, weight loss,weakness, recent illness HEENT- denies eye drainage, change in vision, nasal discharge, CVS- denies chest pain, palpitations RESP- denies SOB, cough, wheeze ABD- denies N/V, change in stools, abd pain GU- denies dysuria, hematuria, dribbling, incontinence MSK- denies joint pain, muscle aches, injury Neuro- denies headache, dizziness, syncope, seizure activity       Objective:    BP (!) 150/78   Pulse 78   Temp 97.8 F (36.6 C) (Temporal)   Resp 16   Ht 6' (1.829 m)   Wt 188 lb (85.3 kg)   SpO2 94%   BMI 25.50 kg/m  GEN- NAD, alert and oriented x3 HEENT- PERRL, EOMI, non injected sclera, pink conjunctiva, MMM, oropharynx clear Neck- Supple, no LAD, no JVD  CVS- RRR, no murmur RESP- scattered wheeze, bronchospasm, audible wheeze in room, increased WOB with ralking, no rales, congestion bilat  ABD-NABS,soft,NT,ND EXT-trace pedal edema Pulses- Radial, DP- 2+        Assessment & Plan:       Problem List Items Addressed This Visit      Unprioritized   Chronic combined systolic and diastolic CHF (congestive heart failure) ([place])    Does not appear to be fluid overloaded I will continue him on his current dose of Lasix in the setting of his chronic kidney disease      Chronic kidney disease   COPD exacerbation ([facility]) - Primary    Depo-Medrol 40 mg IM given in the office.  He was also given albuterol neb.  He improved significantly after the neb.  His bronchospasm pretty much resolved he had scattered wheeze and his work of breathing was much improved.    He will complete the antibiotics I will add prednisone taper.  Prescription  given for nebulizer - albuterol nebs every 4 hours as needed Change to mucinex DM or Coricidan for cough   if he does not improve he should go back to the emergency room.  O2 sat looks okay at 94% here in the office.        Relevant Medications   predniSONE (DELTASONE) 10 MG tablet   albuterol (PROVENTIL) (2.5 MG/3ML) 0.083% nebulizer solution      Note: This dictation was prepared with Dragon dictation along with smaller phrase technology. Any transcriptional errors that result from this process are unintentional.

## 2019-11-08 ENCOUNTER — Other Ambulatory Visit: Payer: Self-pay

## 2019-11-08 ENCOUNTER — Ambulatory Visit (INDEPENDENT_AMBULATORY_CARE_PROVIDER_SITE_OTHER): Payer: Medicare Other | Admitting: *Deleted

## 2019-11-08 DIAGNOSIS — I4891 Unspecified atrial fibrillation: Secondary | ICD-10-CM | POA: Diagnosis not present

## 2019-11-08 DIAGNOSIS — Z5181 Encounter for therapeutic drug level monitoring: Secondary | ICD-10-CM | POA: Diagnosis not present

## 2019-11-08 LAB — POCT INR: INR: 4.7 — AB (ref 2.0–3.0)

## 2019-11-08 NOTE — Patient Instructions (Signed)
Hold warfarin tonight and tomorrow night then resume 1 tablet daily except 1 1/2 tablets on Tuesdays and Fridays Recheck in 2 wks

## 2019-11-22 ENCOUNTER — Ambulatory Visit (INDEPENDENT_AMBULATORY_CARE_PROVIDER_SITE_OTHER): Payer: Medicare Other | Admitting: *Deleted

## 2019-11-22 ENCOUNTER — Other Ambulatory Visit: Payer: Self-pay | Admitting: Family Medicine

## 2019-11-22 DIAGNOSIS — I4891 Unspecified atrial fibrillation: Secondary | ICD-10-CM

## 2019-11-22 DIAGNOSIS — Z5181 Encounter for therapeutic drug level monitoring: Secondary | ICD-10-CM

## 2019-11-22 LAB — POCT INR: INR: 3.5 — AB (ref 2.0–3.0)

## 2019-11-22 NOTE — Patient Instructions (Signed)
Hold warfarin tonight then decrease dose to 1 tablet daily Recheck in 3 wks

## 2019-11-26 DIAGNOSIS — N179 Acute kidney failure, unspecified: Secondary | ICD-10-CM | POA: Diagnosis not present

## 2019-11-26 DIAGNOSIS — I129 Hypertensive chronic kidney disease with stage 1 through stage 4 chronic kidney disease, or unspecified chronic kidney disease: Secondary | ICD-10-CM | POA: Diagnosis not present

## 2019-11-26 DIAGNOSIS — D631 Anemia in chronic kidney disease: Secondary | ICD-10-CM | POA: Diagnosis not present

## 2019-11-26 DIAGNOSIS — N2581 Secondary hyperparathyroidism of renal origin: Secondary | ICD-10-CM | POA: Diagnosis not present

## 2019-11-26 DIAGNOSIS — N184 Chronic kidney disease, stage 4 (severe): Secondary | ICD-10-CM | POA: Diagnosis not present

## 2019-12-01 ENCOUNTER — Other Ambulatory Visit: Payer: Self-pay | Admitting: Family Medicine

## 2019-12-07 ENCOUNTER — Ambulatory Visit: Payer: Medicare Other | Admitting: Cardiology

## 2019-12-11 ENCOUNTER — Ambulatory Visit (INDEPENDENT_AMBULATORY_CARE_PROVIDER_SITE_OTHER): Payer: Medicare Other | Admitting: *Deleted

## 2019-12-11 DIAGNOSIS — Z5181 Encounter for therapeutic drug level monitoring: Secondary | ICD-10-CM | POA: Diagnosis not present

## 2019-12-11 DIAGNOSIS — I4891 Unspecified atrial fibrillation: Secondary | ICD-10-CM | POA: Diagnosis not present

## 2019-12-11 LAB — POCT INR: INR: 1.8 — AB (ref 2.0–3.0)

## 2019-12-11 NOTE — Patient Instructions (Signed)
Increase warfarin to 1 tablet daily except 1 1/2 tablets on Tuesdays Recheck in 3 wks 

## 2019-12-19 DIAGNOSIS — H2511 Age-related nuclear cataract, right eye: Secondary | ICD-10-CM | POA: Diagnosis not present

## 2019-12-19 DIAGNOSIS — H21561 Pupillary abnormality, right eye: Secondary | ICD-10-CM | POA: Diagnosis not present

## 2019-12-19 DIAGNOSIS — H25811 Combined forms of age-related cataract, right eye: Secondary | ICD-10-CM | POA: Diagnosis not present

## 2019-12-22 ENCOUNTER — Other Ambulatory Visit: Payer: Self-pay | Admitting: Family Medicine

## 2019-12-22 ENCOUNTER — Other Ambulatory Visit: Payer: Self-pay | Admitting: Cardiology

## 2019-12-26 MED ORDER — WARFARIN SODIUM 5 MG PO TABS: ORAL_TABLET | ORAL | 3 refills | Status: DC

## 2019-12-26 NOTE — Addendum Note (Signed)
Addended by: Malen Gauze on: 12/26/2019 01:47 PM   Modules accepted: Orders

## 2019-12-28 ENCOUNTER — Other Ambulatory Visit: Payer: Self-pay

## 2019-12-28 ENCOUNTER — Encounter: Payer: Self-pay | Admitting: Student

## 2019-12-28 ENCOUNTER — Ambulatory Visit (INDEPENDENT_AMBULATORY_CARE_PROVIDER_SITE_OTHER): Payer: Medicare Other | Admitting: Student

## 2019-12-28 VITALS — BP 138/68 | HR 72 | Ht 72.0 in | Wt 193.8 lb

## 2019-12-28 DIAGNOSIS — I2510 Atherosclerotic heart disease of native coronary artery without angina pectoris: Secondary | ICD-10-CM | POA: Diagnosis not present

## 2019-12-28 DIAGNOSIS — I10 Essential (primary) hypertension: Secondary | ICD-10-CM | POA: Diagnosis not present

## 2019-12-28 DIAGNOSIS — N184 Chronic kidney disease, stage 4 (severe): Secondary | ICD-10-CM | POA: Diagnosis not present

## 2019-12-28 DIAGNOSIS — I255 Ischemic cardiomyopathy: Secondary | ICD-10-CM

## 2019-12-28 DIAGNOSIS — I4821 Permanent atrial fibrillation: Secondary | ICD-10-CM

## 2019-12-28 NOTE — Patient Instructions (Signed)
Medication Instructions:  Your physician recommends that you continue on your current medications as directed. Please refer to the Current Medication list given to you today.  *If you need a refill on your cardiac medications before your next appointment, please call your pharmacy*   Lab Work: NONE   If you have labs (blood work) drawn today and your tests are completely normal, you will receive your results only by: . MyChart Message (if you have MyChart) OR . A paper copy in the mail If you have any lab test that is abnormal or we need to change your treatment, we will call you to review the results.   Testing/Procedures: NONE    Follow-Up: At [facility], you and your health needs are our priority.  As part of our continuing mission to provide you with exceptional heart care, we have created designated Provider Care Teams.  These Care Teams include your primary Cardiologist (physician) and Advanced Practice Providers (APPs -  Physician Assistants and Nurse Practitioners) who all work together to provide you with the care you need, when you need it.  We recommend signing up for the patient portal called "MyChart".  Sign up information is provided on this After Visit Summary.  MyChart is used to connect with patients for Virtual Visits (Telemedicine).  Patients are able to view lab/test results, encounter notes, upcoming appointments, etc.  Non-urgent messages can be sent to your provider as well.   To learn more about what you can do with MyChart, go to [email or web address].    Your next appointment:   6 month(s)  The format for your next appointment:   In Person  Provider:   Jonathan Branch, MD   Other Instructions Thank you for choosing [facility]!    

## 2019-12-28 NOTE — Progress Notes (Signed)
Cardiology Office Note    Date:  12/29/2019   ID:  [name] [name], DOB [birth year], MRN [id number]  PCP:  Alycia Rossetti, MD  Cardiologist: Carlyle Dolly, MD    Chief Complaint  Patient presents with  . Follow-up    3 month visit    History of Present Illness:    [name] [name] is a 69 y.o. male with past medical history of permanent atrial fibrillation (on Coumadin), CAD (known CTO of RCA with DES to OM1 in 2008), history of ICM (EF previously 35-40% by echo in 2017, at 50-55% by echo in 08/2019), HTN, HLD, Stage 4 CKD and AAA ([name] in 2017) who presents to the office today for 35-month follow-up.  He was last examined by Ermalinda Barrios, PA-C in 09/2019 and had recently been admitted for an acute CHF exacerbation which was complicated by an AKI. He reported still having dyspnea on exertion but denied any chest pain or palpitations. Weight was stable at 191 lbs and he was continued on Lasix 40 mg daily alternating with 20 mg daily.  In talking with the patient today, he reports overall doing well from a cardiac perspective since his last visit. He denies any recent chest pain or palpitations. He is asymptomatic with his arrhythmia. He does have baseline dyspnea on exertion and denies any acute changes in this. No recent orthopnea, PND or lower extremity edema.  He is followed by Dr. Justin Mend with [facility] in regards to his CKD. Says he was told by recent labs that his kidneys looked "stable".  Past Medical History:  Diagnosis Date  . AAA (abdominal aortic aneurysm) ([place])    a. [name] 06/2015 with left renal artery bypass at that time, post-op course c/b renal failure requiring dialysis, C dif.  . Anemia   . Arteriosclerotic cardiovascular disease (ASCVD)    a. AMI in 2000 treated at [facility]. b. cath in 12/2006->  Chronic total obstruction of the RCA;  drug-eluting stent placed in OM1, LVEF abnormal.  . Cerebrovascular disease 2002   carotid stent  . Chronic  anticoagulation   . Chronic combined systolic and diastolic CHF (congestive heart failure) ([place])   . CKD (chronic kidney disease), stage IV ([place])   . COPD (chronic obstructive pulmonary disease) ([place])   . GERD (gastroesophageal reflux disease)   . Hyperlipidemia   . Hypertension   . Myocardial infarction ([place]) 10 yrs ago  . Nephrolithiasis   . Permanent atrial fibrillation ([place])   . PVD (peripheral vascular disease) ([place])    Ct angiogram in 2009 revealed stable disease with 80% celiac stenosis,50% right renal artery ,ASCVD with ulceration in the abdominal aortashe  . Testicular carcinoma ([place]) 1990   right orchiectomy  . Tobacco abuse, in remission    20 pack years; quit in 2009    Past Surgical History:  Procedure Laterality Date  . ABDOMINAL AORTIC ANEURYSM REPAIR N/A 06/09/2015   Procedure: ANEURYSM ABDOMINAL AORTIC REPAIR;  Surgeon: Elam Dutch, MD;  Location: [facility] OR;  Service: Vascular;  Laterality: N/A;  . AORTIC ENDARTERECETOMY N/A 06/09/2015   Procedure: AORTIC ENDARTERECETOMY;  Surgeon: Elam Dutch, MD;  Location: [place];  Service: Vascular;  Laterality: N/A;  . AORTIC/RENAL BYPASS Left 06/09/2015   Procedure: LEFT RENAL Artery BYPASS;  Surgeon: Elam Dutch, MD;  Location: [place];  Service: Vascular;  Laterality: Left;  . APPENDECTOMY  2004  . CHOLECYSTECTOMY N/A 09/18/2018   Procedure: LAPAROSCOPIC CHOLECYSTECTOMY;  Surgeon: Aviva Signs,  MD;  Location: [facility] ORS;  Service: General;  Laterality: N/A;  . COLONOSCOPY  06/17/2011   INCOMPLETE, PREP POOR. Procedure: COLONOSCOPY;  Surgeon: Daneil Dolin, MD;  Location: [facility] ENDO SUITE;  Service: Endoscopy;  Laterality: N/A;  10:00  . COLONOSCOPY  07/15/2011   TDD:UKGURKYH rectal and colon polyps  . COLONOSCOPY N/A 05/22/2015   CWC:BJSEGBTD colonic and rectal polyps. tubular adenomas.repeat TCS 05/2018  . ESOPHAGEAL DILATION N/A 05/22/2015   Procedure: ESOPHAGEAL DILATION;  Surgeon: Daneil Dolin, MD;  [facility];   Service: Endoscopy;  Laterality: N/A;  . ESOPHAGOGASTRODUODENOSCOPY  06/17/2011   severe erosive/ulcerative reflux esophagitis, soft noncritical stricture dilatied, small hh, antral erosion   . ESOPHAGOGASTRODUODENOSCOPY N/A 05/22/2015   RMR: 2 cm HH otherwise normal. [name]/p empirical dilation.  . INSERTION OF DIALYSIS CATHETER Left 06/26/2015   Procedure: INSERTION OF DIALYSIS CATHETER;  Surgeon: Angelia Mould, MD;  Location: [place];  Service: Vascular;  Laterality: Left;  . PERIPHERAL VASCULAR CATHETERIZATION N/A 05/28/2015   Procedure: Abdominal Aortogram;  Surgeon: Serafina Mitchell, MD;  Location: [place] CV LAB;  Service: Cardiovascular;  Laterality: N/A;  . testicular cancer  1990   right orchiectomy    Current Medications: Outpatient Medications Prior to Visit  Medication Sig Dispense Refill  . albuterol (PROVENTIL) (2.5 MG/3ML) 0.083% nebulizer solution Take 3 mLs (2.5 mg total) by nebulization every 4 (four) hours as needed for wheezing or shortness of breath. 75 mL 1  . albuterol (VENTOLIN HFA) 108 (90 Base) MCG/ACT inhaler Inhale 2-4 puffs into the lungs every 4 (four) hours as needed for wheezing or shortness of breath. 8 g 0  . amLODipine (NORVASC) 10 MG tablet Take 1 tablet by mouth once daily 90 tablet 0  . atorvastatin (LIPITOR) 20 MG tablet Take 1 tablet (20 mg total) by mouth daily. 30 tablet 3  . calcitRIOL (ROCALTROL) 0.25 MCG capsule Take 0.25 mcg by mouth daily.     . ferrous sulfate 325 (65 FE) MG tablet Take 325 mg by mouth 3 (three) times daily.    . fluticasone (FLONASE) 50 MCG/ACT nasal spray Place 1 spray into both nostrils daily as needed for allergies or rhinitis.    . furosemide (LASIX) 40 MG tablet Take 0.5-1 tablets (20-40 mg total) by mouth See admin instructions. Take 40 mg by mouth every other day, alternating with 20 mg 45 tablet 2  . hydrALAZINE (APRESOLINE) 25 MG tablet TAKE 1 & 1/2 (ONE & ONE-HALF) TABLETS BY MOUTH THREE TIMES DAILY 405 tablet 0  .  metoprolol succinate (TOPROL-XL) 100 MG 24 hr tablet TAKE 1 & 1/2 (ONE & ONE-HALF) TABLETS BY MOUTH ONCE DAILY 135 tablet 6  . nitroGLYCERIN (NITROSTAT) 0.4 MG SL tablet place 1 tablet under the tongue if needed every 5 minutes for chest pain for 3 doses IF NO RELIEF AFTER 3RD DOSE CALL PRESCRIBER OR 911. 25 tablet 2  . pantoprazole (PROTONIX) 40 MG tablet Take 1 tablet by mouth once daily 90 tablet 0  . tamsulosin (FLOMAX) 0.4 MG CAPS capsule TAKE 1 CAPSULE BY MOUTH ONCE DAILY AFTER  SUPPER. 90 capsule 0  . warfarin (COUMADIN) 5 MG tablet TAKE 1 TABLET BY MOUTH ONCE DAILY EXCEPT  1.5  TABLETS  ON  TUESDAYS OR AS DIRECTED 100 tablet 3  . Ferrous Sulfate (IRON) 325 (65 Fe) MG TABS TAKE 1 TABLET BY MOUTH THREE TIMES DAILY WITH MEALS 270 tablet 0  . ondansetron (ZOFRAN) 4 MG tablet Take 1 tablet (4  mg total) by mouth every 8 (eight) hours as needed for nausea or vomiting. 20 tablet 0  . predniSONE (DELTASONE) 10 MG tablet Take 40mg  x 2 days,20mg  x2 days, 10mg  x2 days 14 tablet 0   No facility-administered medications prior to visit.     Allergies:   Patient has no known allergies.   Social History   Socioeconomic History  . Marital status: Married    Spouse name: Not on file  . Number of children: 2  . Years of education: Not on file  . Highest education level: Not on file  Occupational History  . Occupation: disabled    Fish farm manager: UNEMPLOYED  Tobacco Use  . Smoking status: Former Smoker    Packs/day: 0.50    Years: 40.00    Pack years: 20.00    Types: Cigarettes    Quit date: 03/02/2019    Years since quitting: 0.8  . Smokeless tobacco: Never Used  . Tobacco comment: 1/2 pack daily  Vaping Use  . Vaping Use: Never used  Substance and Sexual Activity  . Alcohol use: No    Alcohol/week: 0.0 standard drinks  . Drug use: No  . Sexual activity: Yes    Birth control/protection: None  Other Topics Concern  . Not on file  Social History Narrative  . Not on file   Social  Determinants of Health   Financial Resource Strain:   . Difficulty of Paying Living Expenses: Not on file  Food Insecurity:   . Worried About Charity fundraiser in the Last Year: Not on file  . Ran Out of Food in the Last Year: Not on file  Transportation Needs:   . Lack of Transportation (Medical): Not on file  . Lack of Transportation (Non-Medical): Not on file  Physical Activity:   . Days of Exercise per Week: Not on file  . Minutes of Exercise per Session: Not on file  Stress:   . Feeling of Stress : Not on file  Social Connections:   . Frequency of Communication with Friends and Family: Not on file  . Frequency of Social Gatherings with Friends and Family: Not on file  . Attends Religious Services: Not on file  . Active Member of Clubs or Organizations: Not on file  . Attends Archivist Meetings: Not on file  . Marital Status: Not on file     Family History:  The patient'[name] family history includes Colon cancer (age of onset: 8) in his father; Heart disease in his mother; Prostate cancer in his father.   Review of Systems:   Please see the history of present illness.     General:  No chills, fever, night sweats or weight changes.  Cardiovascular:  No chest pain, edema, orthopnea, palpitations, paroxysmal nocturnal dyspnea. Positive for dyspnea on exertion.  Dermatological: No rash, lesions/masses Respiratory: No cough, dyspnea Urologic: No hematuria, dysuria Abdominal:   No nausea, vomiting, diarrhea, bright red blood per rectum, melena, or hematemesis Neurologic:  No visual changes, wkns, changes in mental status. All other systems reviewed and are otherwise negative except as noted above.   Physical Exam:    VS:  BP 138/68   Pulse 72   Ht 6' (1.829 m)   Wt 193 lb 12.8 oz (87.9 kg)   SpO2 96%   BMI 26.28 kg/m    General: Well developed, well nourished,male appearing in no acute distress. Head: Normocephalic, atraumatic. Neck: No carotid bruits. JVD  not elevated.  Lungs: Respirations regular and  unlabored, without wheezes or rales.  Heart: Irregularly irregular. No S3 or S4.  No murmur, no rubs, or gallops appreciated. Abdomen: Appears non-distended. No obvious abdominal masses. Msk:  Strength and tone appear normal for age. No obvious joint deformities or effusions. Extremities: No clubbing or cyanosis. Trace ankle edema bilaterally.  Distal pedal pulses are 2+ bilaterally. Neuro: Alert and oriented X 3. Moves all extremities spontaneously. No focal deficits noted. Psych:  Responds to questions appropriately with a normal affect. Skin: No rashes or lesions noted  Wt Readings from Last 3 Encounters:  12/28/19 193 lb 12.8 oz (87.9 kg)  10/17/19 188 lb (85.3 kg)  10/16/19 191 lb (86.6 kg)     Studies/Labs Reviewed:   EKG:  EKG is not ordered today.    Recent Labs: 08/21/2019: Magnesium 2.7 10/16/2019: ALT 17; B Natriuretic Peptide 559.0; BUN 24; Creatinine, Ser 2.94; Hemoglobin 13.1; Platelets 182; Potassium 3.9; Sodium 138   Lipid Panel    Component Value Date/Time   CHOL 203 (H) 08/31/2019 1431   TRIG 154 (H) 08/31/2019 1431   HDL 34 (L) 08/31/2019 1431   CHOLHDL 6.0 (H) 08/31/2019 1431   VLDL 52 (H) 12/28/2016 1017   LDLCALC 140 (H) 08/31/2019 1431    Additional studies/ records that were reviewed today include:   Echocardiogram: 08/2019 IMPRESSIONS    1. Left ventricular ejection fraction, by estimation, is 50 to 55%. The  left ventricle has low normal function. The left ventricle has no regional  wall motion abnormalities. There is moderate concentric left ventricular  hypertrophy. Left ventricular  diastolic parameters are indeterminate. Elevated left ventricular  end-diastolic pressure.  2. Right ventricular systolic function is normal. The right ventricular  size is normal.  3. Left atrial size was severely dilated.  4. Right atrial size was mild to moderately dilated.  5. The mitral valve is grossly  normal. Mild mitral valve regurgitation.  6. The aortic valve is tricuspid. Aortic valve regurgitation is trivial.  No aortic stenosis is present.  7. The inferior vena cava is dilated in size with >50% respiratory  variability, suggesting right atrial pressure of 8 mmHg.   Assessment:    1. Permanent atrial fibrillation ([place])   2. Coronary artery disease involving native coronary artery of native heart without angina pectoris   3. Ischemic cardiomyopathy   4. Essential hypertension   5. CKD (chronic kidney disease) stage 4, GFR 15-29 ml/min (HCC)      Plan:   In order of problems listed above:  1. Permanent Atrial Fibrillation - He denies any recent palpitations and HR is well-controlled in the 70'[name] during today'[name] visit. Continue Toprol-XL 150mg  daily for rate-control. - He denies any evidence of active bleeding. Remains on Coumadin for anticoagulation. INR was slightly low at 1.8 earlier this month and he is scheduled for a repeat INR check next week. Hgb and platelets were WNL in 10/2019.  2. CAD - He has a known CTO of RCA with DES to OM1 in 2008. He denies any recent chest pain and his breathing has been at baseline.  - Continue BB and statin therapy. He is no longer on ASA given the need for anticoagulation.   3. History of Ischemic Cardiomyopathy - EF was previously reduced at 35-40% by echo in 2017, improved to 50-55% by echo in 08/2019.  - He appears euvolemic on examination. Continue current medication regimen with Lasix 40mg  daily alternating with 20mg  daily, Toprol-XL 150mg  daily and Hydralazine 37.5mg  TID. Not on an ACE-I/ARB/ARNI  due to underlying CKD.   4. HTN - BP is at 138/68 during today'[name] visit. Continue current medication regimen with Amlodipine, Hydralazine and Toprol-XL.   5. Stage 4 CKD - Followed by Dr. Justin Mend. Creatinine was at 2.94 in 10/2019 which appears close to his baseline (variable from 2.48 - 3.04 this year by review of labs in Epic).      Medication Adjustments/Labs and Tests Ordered: Current medicines are reviewed at length with the patient today.  Concerns regarding medicines are outlined above.  Medication changes, Labs and Tests ordered today are listed in the Patient Instructions below. Patient Instructions  Medication Instructions:  Your physician recommends that you continue on your current medications as directed. Please refer to the Current Medication list given to you today.  *If you need a refill on your cardiac medications before your next appointment, please call your pharmacy*   Lab Work: NONE  If you have labs (blood work) drawn today and your tests are completely normal, you will receive your results only by: [name] MyChart Message (if you have MyChart) OR . A paper copy in the mail If you have any lab test that is abnormal or we need to change your treatment, we will call you to review the results.   Testing/Procedures: NONE   Follow-Up: At [facility], you and your health needs are our priority.  As part of our continuing mission to provide you with exceptional heart care, we have created designated Provider Care Teams.  These Care Teams include your primary Cardiologist (physician) and Advanced Practice Providers (APPs -  Physician Assistants and Nurse Practitioners) who all work together to provide you with the care you need, when you need it.  We recommend signing up for the patient portal called "MyChart".  Sign up information is provided on this After Visit Summary.  MyChart is used to connect with patients for Virtual Visits (Telemedicine).  Patients are able to view lab/test results, encounter notes, upcoming appointments, etc.  Non-urgent messages can be sent to your provider as well.   To learn more about what you can do with MyChart, go to [email or web address].    Your next appointment:   6 month([name])  The format for your next appointment:   In Person  Provider:   Carlyle Dolly,  MD   Other Instructions Thank you for choosing [facility]!     Signed, Erma Heritage, PA-C  12/29/2019 7:53 AM    [name] [name]. [address] [place], [place] [address] Phone: [phone] Fax: [phone]

## 2019-12-29 ENCOUNTER — Encounter: Payer: Self-pay | Admitting: Student

## 2020-01-01 ENCOUNTER — Ambulatory Visit (INDEPENDENT_AMBULATORY_CARE_PROVIDER_SITE_OTHER): Payer: Medicare Other | Admitting: *Deleted

## 2020-01-01 ENCOUNTER — Other Ambulatory Visit: Payer: Self-pay

## 2020-01-01 DIAGNOSIS — Z5181 Encounter for therapeutic drug level monitoring: Secondary | ICD-10-CM

## 2020-01-01 DIAGNOSIS — I4891 Unspecified atrial fibrillation: Secondary | ICD-10-CM | POA: Diagnosis not present

## 2020-01-01 LAB — POCT INR: INR: 2.4 (ref 2.0–3.0)

## 2020-01-01 NOTE — Patient Instructions (Signed)
Continue warfarin 1 tablet daily except 1 1/2 tablets on Tuesdays  Recheck in 4 wks 

## 2020-01-13 ENCOUNTER — Other Ambulatory Visit: Payer: Self-pay | Admitting: Family Medicine

## 2020-01-13 ENCOUNTER — Other Ambulatory Visit: Payer: Self-pay | Admitting: Cardiology

## 2020-01-22 ENCOUNTER — Other Ambulatory Visit: Payer: Self-pay

## 2020-01-22 ENCOUNTER — Encounter: Payer: Self-pay | Admitting: Family Medicine

## 2020-01-22 ENCOUNTER — Ambulatory Visit (INDEPENDENT_AMBULATORY_CARE_PROVIDER_SITE_OTHER): Payer: Medicare Other | Admitting: Family Medicine

## 2020-01-22 VITALS — BP 142/80 | HR 64 | Temp 98.2°F | Resp 18 | Ht 72.0 in | Wt 199.0 lb

## 2020-01-22 DIAGNOSIS — E782 Mixed hyperlipidemia: Secondary | ICD-10-CM | POA: Diagnosis not present

## 2020-01-22 DIAGNOSIS — D509 Iron deficiency anemia, unspecified: Secondary | ICD-10-CM

## 2020-01-22 DIAGNOSIS — I10 Essential (primary) hypertension: Secondary | ICD-10-CM | POA: Diagnosis not present

## 2020-01-22 DIAGNOSIS — Z125 Encounter for screening for malignant neoplasm of prostate: Secondary | ICD-10-CM

## 2020-01-22 DIAGNOSIS — Z8547 Personal history of malignant neoplasm of testis: Secondary | ICD-10-CM

## 2020-01-22 DIAGNOSIS — J449 Chronic obstructive pulmonary disease, unspecified: Secondary | ICD-10-CM

## 2020-01-22 DIAGNOSIS — N184 Chronic kidney disease, stage 4 (severe): Secondary | ICD-10-CM

## 2020-01-22 DIAGNOSIS — Z23 Encounter for immunization: Secondary | ICD-10-CM

## 2020-01-22 DIAGNOSIS — I5042 Chronic combined systolic (congestive) and diastolic (congestive) heart failure: Secondary | ICD-10-CM

## 2020-01-22 MED ORDER — FERROUS SULFATE 325 (65 FE) MG PO TABS: 325.0000 mg | ORAL_TABLET | Freq: Three times a day (TID) | ORAL | 1 refills | Status: DC

## 2020-01-22 MED ORDER — TAMSULOSIN HCL 0.4 MG PO CAPS: ORAL_CAPSULE | ORAL | 1 refills | Status: DC

## 2020-01-22 NOTE — Progress Notes (Signed)
   Subjective:    Patient ID: [name] [name], male    DOB: 1951-05-02, 69 y.o.   MRN: [id number]  Patient presents for Follow-up (is not fasting)   Pt here f/u chronic medical problems.  He was recently seen by his cardiologist.  He has atrial fibrillation on Coumadin also underlying heart failure.  He has CKD stage for which he is followed by nephrology for.  States that he is supposed to have some type of virtual learning about dialysis.  He is taking all his medicines as prescribed.  He is due for repeat lipid panel as he is on Lipitor 20 mg now.   PNA and flu vaccine due    COVI D vaccine done at [facility]   He has no new concerns today.  States that his breathing is okay he has not had any chest pain.  He is still urinating at baseline.  No difficulties with bowels.  He is on iron therapy three times a day    Review Of Systems:  GEN- denies fatigue, fever, weight loss,weakness, recent illness HEENT- denies eye drainage, change in vision, nasal discharge, CVS- denies chest pain, palpitations RESP- denies SOB, cough, wheeze ABD- denies N/V, change in stools, abd pain GU- denies dysuria, hematuria, dribbling, incontinence MSK- denies joint pain, muscle aches, injury Neuro- denies headache, dizziness, syncope, seizure activity       Objective:    BP (!) 142/80   Pulse 64   Temp 98.2 F (36.8 C) (Temporal)   Resp 18   Ht 6' (1.829 m)   Wt 199 lb (90.3 kg)   SpO2 97%   BMI 26.99 kg/m  GEN- NAD, alert and oriented x3 HEENT- PERRL, EOMI, non injected sclera, pink conjunctiva, MMM, oropharynx clear Neck- Supple, no thyromegaly CVS- irregular rhythem, normal rate , no murmur RESP-CTAB ABD-NABS,soft,NT,ND EXT- pedal 1+ edema Pulses- Radial, DP- 2+        Assessment & Plan:      Problem List Items Addressed This Visit      Unprioritized   Chronic combined systolic and diastolic CHF (congestive heart failure) (HCC)    Mild fluid overload though he is not  symptomatic Per above      Chronic kidney disease   Relevant Orders   Comprehensive metabolic panel   COPD (chronic obstructive pulmonary disease) (HCC)    No bronchosasm, prn albuterol for now Flu and PNA vaccine given Obtain COVID-19 records       Essential hypertension - Primary (Chronic)    bp mildly elevated but he does have more fluid today He will take lasix in AM as prescribed by nephrology  Continue all other meds       Relevant Orders   CBC with Differential/Platelet   History of testicular cancer   Hyperlipidemia (Chronic)   Relevant Orders   Lipid panel   Iron deficiency anemia    Iron def in setting of CKD Check iron stores, see if iron can be reduced       Relevant Orders   Iron, TIBC and Ferritin Panel    Other Visit Diagnoses    Prostate cancer screening       check PSA   Relevant Orders   PSA      Note: This dictation was prepared with Dragon dictation along with smaller phrase technology. Any transcriptional errors that result from this process are unintentional.

## 2020-01-22 NOTE — Assessment & Plan Note (Signed)
Mild fluid overload though he is not symptomatic Per above

## 2020-01-22 NOTE — Assessment & Plan Note (Signed)
Iron def in setting of CKD Check iron stores, see if iron can be reduced

## 2020-01-22 NOTE — Assessment & Plan Note (Signed)
bp mildly elevated but he does have more fluid today He will take lasix in AM as prescribed by nephrology  Continue all other meds

## 2020-01-22 NOTE — Patient Instructions (Signed)
Refills made to pharamacy We will call with lab results FLu shot given We will call with lab results F/U 4 months

## 2020-01-22 NOTE — Assessment & Plan Note (Signed)
No bronchosasm, prn albuterol for now Flu and PNA vaccine given Obtain COVID-19 records

## 2020-01-23 LAB — CBC WITH DIFFERENTIAL/PLATELET
Absolute Monocytes: 694 cells/uL (ref 200–950)
Basophils Absolute: 41 cells/uL (ref 0–200)
Basophils Relative: 0.6 %
Eosinophils Absolute: 143 cells/uL (ref 15–500)
Eosinophils Relative: 2.1 %
HCT: 38.3 % — ABNORMAL LOW (ref 38.5–50.0)
Hemoglobin: 12.7 g/dL — ABNORMAL LOW (ref 13.2–17.1)
Lymphs Abs: 1605 cells/uL (ref 850–3900)
MCH: 29.8 pg (ref 27.0–33.0)
MCHC: 33.2 g/dL (ref 32.0–36.0)
MCV: 89.9 fL (ref 80.0–100.0)
MPV: 10.9 fL (ref 7.5–12.5)
Monocytes Relative: 10.2 %
Neutro Abs: 4318 cells/uL (ref 1500–7800)
Neutrophils Relative %: 63.5 %
Platelets: 184 10*3/uL (ref 140–400)
RBC: 4.26 10*6/uL (ref 4.20–5.80)
RDW: 14.4 % (ref 11.0–15.0)
Total Lymphocyte: 23.6 %
WBC: 6.8 10*3/uL (ref 3.8–10.8)

## 2020-01-23 LAB — COMPREHENSIVE METABOLIC PANEL
AG Ratio: 1.4 (calc) (ref 1.0–2.5)
ALT: 8 U/L — ABNORMAL LOW (ref 9–46)
AST: 10 U/L (ref 10–35)
Albumin: 3.6 g/dL (ref 3.6–5.1)
Alkaline phosphatase (APISO): 100 U/L (ref 35–144)
BUN/Creatinine Ratio: 7 (calc) (ref 6–22)
BUN: 21 mg/dL (ref 7–25)
CO2: 21 mmol/L (ref 20–32)
Calcium: 9.1 mg/dL (ref 8.6–10.3)
Chloride: 112 mmol/L — ABNORMAL HIGH (ref 98–110)
Creat: 2.99 mg/dL — ABNORMAL HIGH (ref 0.70–1.25)
Globulin: 2.6 g/dL (calc) (ref 1.9–3.7)
Glucose, Bld: 90 mg/dL (ref 65–99)
Potassium: 4.7 mmol/L (ref 3.5–5.3)
Sodium: 141 mmol/L (ref 135–146)
Total Bilirubin: 0.3 mg/dL (ref 0.2–1.2)
Total Protein: 6.2 g/dL (ref 6.1–8.1)

## 2020-01-23 LAB — LIPID PANEL
Cholesterol: 145 mg/dL (ref <200)
HDL: 30 mg/dL — ABNORMAL LOW (ref >=40)
LDL Cholesterol (Calc): 78 mg/dL (calc)
Non-HDL Cholesterol (Calc): 115 mg/dL (calc) (ref <130)
Total CHOL/HDL Ratio: 4.8 (calc) (ref <5.0)
Triglycerides: 303 mg/dL — ABNORMAL HIGH (ref <150)

## 2020-01-23 LAB — IRON,TIBC AND FERRITIN PANEL
%SAT: 18 % (calc) — ABNORMAL LOW (ref 20–48)
Ferritin: 209 ng/mL (ref 24–380)
Iron: 40 ug/dL — ABNORMAL LOW (ref 50–180)
TIBC: 225 mcg/dL (calc) — ABNORMAL LOW (ref 250–425)

## 2020-01-23 LAB — PSA: PSA: 0.95 ng/mL (ref <=4.0)

## 2020-01-23 NOTE — Addendum Note (Signed)
Addended by: Sheral Flow on: 01/23/2020 02:25 PM   Modules accepted: Orders

## 2020-01-31 ENCOUNTER — Ambulatory Visit (INDEPENDENT_AMBULATORY_CARE_PROVIDER_SITE_OTHER): Payer: Medicare Other | Admitting: *Deleted

## 2020-01-31 DIAGNOSIS — Z5181 Encounter for therapeutic drug level monitoring: Secondary | ICD-10-CM | POA: Diagnosis not present

## 2020-01-31 DIAGNOSIS — I4891 Unspecified atrial fibrillation: Secondary | ICD-10-CM

## 2020-01-31 LAB — POCT INR: INR: 2.3 (ref 2.0–3.0)

## 2020-01-31 NOTE — Patient Instructions (Signed)
Continue warfarin 1 tablet daily except 1 1/2 tablets on Tuesdays Recheck in 6 wks

## 2020-02-09 ENCOUNTER — Other Ambulatory Visit: Payer: Self-pay | Admitting: Cardiology

## 2020-02-23 ENCOUNTER — Other Ambulatory Visit: Payer: Self-pay | Admitting: Cardiology

## 2020-03-13 ENCOUNTER — Ambulatory Visit (INDEPENDENT_AMBULATORY_CARE_PROVIDER_SITE_OTHER): Payer: Medicare Other | Admitting: *Deleted

## 2020-03-13 DIAGNOSIS — I4891 Unspecified atrial fibrillation: Secondary | ICD-10-CM | POA: Diagnosis not present

## 2020-03-13 DIAGNOSIS — Z5181 Encounter for therapeutic drug level monitoring: Secondary | ICD-10-CM

## 2020-03-13 LAB — POCT INR: INR: 2.8 (ref 2.0–3.0)

## 2020-03-13 NOTE — Patient Instructions (Signed)
Continue warfarin 1 tablet daily except 1 1/2 tablets on Tuesdays Recheck in 6 wks

## 2020-03-14 ENCOUNTER — Other Ambulatory Visit: Payer: Self-pay | Admitting: Family Medicine

## 2020-03-31 ENCOUNTER — Other Ambulatory Visit: Payer: Self-pay | Admitting: Family Medicine

## 2020-04-03 ENCOUNTER — Other Ambulatory Visit: Payer: Self-pay | Admitting: Family Medicine

## 2020-04-07 ENCOUNTER — Other Ambulatory Visit: Payer: Self-pay | Admitting: Physician Assistant

## 2020-04-24 ENCOUNTER — Ambulatory Visit (INDEPENDENT_AMBULATORY_CARE_PROVIDER_SITE_OTHER): Payer: Medicare Other | Admitting: *Deleted

## 2020-04-24 DIAGNOSIS — I4891 Unspecified atrial fibrillation: Secondary | ICD-10-CM

## 2020-04-24 DIAGNOSIS — Z5181 Encounter for therapeutic drug level monitoring: Secondary | ICD-10-CM | POA: Diagnosis not present

## 2020-04-24 LAB — POCT INR: INR: 2.5 (ref 2.0–3.0)

## 2020-04-24 NOTE — Patient Instructions (Signed)
Continue warfarin 1 tablet daily except 1 1/2 tablets on Tuesdays Recheck in 6 wks

## 2020-05-01 ENCOUNTER — Ambulatory Visit (HOSPITAL_COMMUNITY)
Admission: RE | Admit: 2020-05-01 | Discharge: 2020-05-01 | Disposition: A | Discharge status: Home / Self Care | Payer: Medicare Other | Source: Ambulatory Visit | Attending: Nurse Practitioner | Admitting: Nurse Practitioner

## 2020-05-01 ENCOUNTER — Encounter: Payer: Self-pay | Admitting: Nurse Practitioner

## 2020-05-01 ENCOUNTER — Ambulatory Visit (INDEPENDENT_AMBULATORY_CARE_PROVIDER_SITE_OTHER): Payer: Medicare Other | Admitting: Nurse Practitioner

## 2020-05-01 ENCOUNTER — Other Ambulatory Visit: Payer: Self-pay

## 2020-05-01 VITALS — HR 82 | Temp 99.0°F

## 2020-05-01 DIAGNOSIS — R059 Cough, unspecified: Secondary | ICD-10-CM | POA: Diagnosis not present

## 2020-05-01 DIAGNOSIS — J811 Chronic pulmonary edema: Secondary | ICD-10-CM | POA: Diagnosis not present

## 2020-05-01 DIAGNOSIS — J069 Acute upper respiratory infection, unspecified: Secondary | ICD-10-CM

## 2020-05-01 DIAGNOSIS — R0602 Shortness of breath: Secondary | ICD-10-CM | POA: Diagnosis not present

## 2020-05-01 DIAGNOSIS — J441 Chronic obstructive pulmonary disease with (acute) exacerbation: Secondary | ICD-10-CM | POA: Insufficient documentation

## 2020-05-01 DIAGNOSIS — R509 Fever, unspecified: Secondary | ICD-10-CM | POA: Diagnosis not present

## 2020-05-01 DIAGNOSIS — R1114 Bilious vomiting: Secondary | ICD-10-CM

## 2020-05-01 DIAGNOSIS — I517 Cardiomegaly: Secondary | ICD-10-CM | POA: Diagnosis not present

## 2020-05-01 MED ORDER — ONDANSETRON 4 MG PO TBDP: 4.0000 mg | ORAL_TABLET | Freq: Three times a day (TID) | ORAL | 0 refills | Status: DC | PRN

## 2020-05-01 MED ORDER — PREDNISONE 20 MG PO TABS: 40.0000 mg | ORAL_TABLET | Freq: Every day | ORAL | 0 refills | Status: DC

## 2020-05-01 MED ORDER — AZITHROMYCIN 250 MG PO TABS: ORAL_TABLET | ORAL | 0 refills | Status: DC

## 2020-05-01 NOTE — Progress Notes (Signed)
Subjective:    Patient ID: [name] [name], male    DOB: March 11, 1951, 69 y.o.   MRN: [id number]  HPI: [name] [name] is a 69 y.o. male presenting via parking lot visit due to COVID-19 pandemic for body aches.   Chief Complaint  Patient presents with  . Back Pain  . Illness   UPPER RESPIRATORY TRACT INFECTION Reports fluid build up around his lungs and was in the hospital for earlier this year.  Has received 2 COVID vaccines, no booster yet, is UTD on pneumonia and flu shots.  Has been unable to keep much down the past week; has been vomiting most of what he eats and drinks and has not been hungry or thirsty. Onset: 1 week ago Worst symptom: "can't catch my breath" Fever: yes; subjective, body aches Cough: yes Shortness of breath: yes Wheezing: yes; albuterol nebulizer is helping some Chest pain: no Chest tightness: no Chest congestion: no Nasal congestion: yes; clear nasal congestion Runny nose: no Post nasal drip: no Sneezing: no Sore throat: no Swollen glands: no Sinus pressure: no Headache: yes Face pain: yes Toothache: yes Ear pain: no  Ear pressure: no  Eyes red/itching:no Eye drainage/crusting: yes  Nausea: yes Vomiting: yes Diarrhea: yes Change in appetite: yes; unable to keep fluids down Loss of taste/smell: no Rash: no Fatigue: yes Sick contacts: no Strep contacts: no  Context: stable Recurrent sinusitis: no Treatments attempted: Tylenol, chicken noodle soup, water  Relief with OTC medications: no  No Known Allergies  Outpatient Encounter Medications as of 05/01/2020  Medication Sig  . pantoprazole (PROTONIX) 40 MG tablet Take 1 tablet by mouth once daily  . warfarin (COUMADIN) 5 MG tablet TAKE 1 TABLET BY MOUTH ONCE DAILY EXCEPT 1.5 TABLETS ON TUESDAYS  . albuterol (PROVENTIL) (2.5 MG/3ML) 0.083% nebulizer solution USE 1 VIAL IN NEBULIZER EVERY 4 HOURS AS NEEDED FOR WHEEZING OR SHORTNESS OF BREATH  . albuterol (VENTOLIN HFA) 108 (90 Base)  MCG/ACT inhaler INHALE 2 TO 4 PUFFS BY MOUTH EVERY 4 HOURS AS NEEDED FOR WHEEZING FOR SHORTNESS OF BREATH  . amLODipine (NORVASC) 10 MG tablet Take 1 tablet by mouth once daily  . atorvastatin (LIPITOR) 20 MG tablet Take 1 tablet (20 mg total) by mouth daily.  . calcitRIOL (ROCALTROL) 0.25 MCG capsule Take 0.25 mcg by mouth daily.   . ferrous sulfate 325 (65 FE) MG tablet Take 1 tablet (325 mg total) by mouth 3 (three) times daily.  . fluticasone (FLONASE) 50 MCG/ACT nasal spray Place 1 spray into both nostrils daily as needed for allergies or rhinitis.  . furosemide (LASIX) 40 MG tablet TAKE 1 TABLET ALTERNATING WITH ONE-HALF TABLET EVERY OTHER DAY  . hydrALAZINE (APRESOLINE) 25 MG tablet TAKE 1 & 1/2 (ONE & ONE-HALF) TABLETS BY MOUTH THREE TIMES DAILY  . metoprolol succinate (TOPROL-XL) 100 MG 24 hr tablet TAKE 1 & 1/2 (ONE & ONE-HALF) TABLETS BY MOUTH ONCE DAILY  . nitroGLYCERIN (NITROSTAT) 0.4 MG SL tablet place 1 tablet under the tongue if needed every 5 minutes for chest pain for 3 doses IF NO RELIEF AFTER 3RD DOSE CALL PRESCRIBER OR 911.  . ondansetron (ZOFRAN ODT) 4 MG disintegrating tablet Take 1 tablet (4 mg total) by mouth every 8 (eight) hours as needed for nausea or vomiting.  . tamsulosin (FLOMAX) 0.4 MG CAPS capsule TAKE 1 CAPSULE BY MOUTH ONCE DAILY AFTER  SUPPER.   No facility-administered encounter medications on file as of 05/01/2020.    Patient Active Problem List  Diagnosis Date Noted  . Upper respiratory tract infection 05/01/2020  . Iron deficiency anemia 01/22/2020  . Chronic combined systolic and diastolic CHF (congestive heart failure) ([place]) 08/20/2019  . Paroxysmal A-fib ([place]) 08/20/2019  . Prolonged QT interval 08/20/2019  . COPD (chronic obstructive pulmonary disease) ([place])   . Follow-up examination following surgery 09/28/2018  . Calculus of gallbladder with acute cholecystitis without obstruction   . Status post abdominal aortic aneurysm (AAA) repair   .  History of CVA with residual deficit   . Pseudoaneurysm of aorta ([place]) 06/09/2015  . CAD - s/p PCI to Cx. 100% RCA CTO 05/28/2015  . Hiatal hernia   . History of colonic polyps   . Hx of adenomatous colonic polyps 05/14/2015  . Constipation 04/22/2015  . Insomnia 07/16/2014  . ED (erectile dysfunction) 06/17/2013  . Chronic kidney disease 09/28/2011  . GERD (gastroesophageal reflux disease) 06/01/2011  . Family history of colon cancer 05/20/2011  . Chronic anticoagulation 09/24/2010  . Tobacco abuse   . ATHEROSCLEROTIC CARDIOVASCULAR DISEASE 11/13/2009  . PERIPHERAL VASCULAR DISEASE 11/13/2009  . Hyperlipidemia 10/24/2008  . History of testicular cancer 10/22/2008  . Essential hypertension 10/22/2008    Past Medical History:  Diagnosis Date  . AAA (abdominal aortic aneurysm) ([place])    a. s/p repair 06/2015 with left renal artery bypass at that time, post-op course c/b renal failure requiring dialysis, C dif.  . Anemia   . Arteriosclerotic cardiovascular disease (ASCVD)    a. AMI in 2000 treated at [facility]. b. cath in 12/2006->  Chronic total obstruction of the RCA;  drug-eluting stent placed in OM1, LVEF abnormal.  . Cerebrovascular disease 2002   carotid stent  . Chronic anticoagulation   . Chronic combined systolic and diastolic CHF (congestive heart failure) ([place])   . CKD (chronic kidney disease), stage IV ([place])   . COPD (chronic obstructive pulmonary disease) ([place])   . GERD (gastroesophageal reflux disease)   . Hyperlipidemia   . Hypertension   . Myocardial infarction ([place]) 10 yrs ago  . Nephrolithiasis   . Permanent atrial fibrillation ([place])   . PVD (peripheral vascular disease) ([place])    Ct angiogram in 2009 revealed stable disease with 80% celiac stenosis,50% right renal artery ,ASCVD with ulceration in the abdominal aortashe  . Testicular carcinoma ([place]) 1990   right orchiectomy  . Tobacco abuse, in remission    20 pack years; quit in 2009    Relevant past medical,  surgical, family and social history reviewed and updated as indicated. Interim medical history since our last visit reviewed.  Review of Systems  Constitutional: Positive for activity change, appetite change, chills, fatigue, fever and unexpected weight change. Negative for diaphoresis.  HENT: Positive for congestion. Negative for ear discharge, ear pain, postnasal drip, rhinorrhea, sinus pressure, sinus pain, sneezing, sore throat and trouble swallowing.   Eyes: Positive for discharge (clear, watery drainage). Negative for pain, redness and itching.  Respiratory: Positive for cough, shortness of breath and wheezing. Negative for chest tightness.   Cardiovascular: Negative.  Negative for chest pain.  Gastrointestinal: Positive for diarrhea, nausea and vomiting. Negative for blood in stool and constipation.  Musculoskeletal: Positive for myalgias.  Skin: Negative.  Negative for color change, pallor and rash.  Neurological: Negative.   Psychiatric/Behavioral: Negative.     Per HPI unless specifically indicated above     Objective:    Pulse 82   Temp 99 F (37.2 C) (Temporal)   SpO2 94%   Wt Readings from Last  3 Encounters:  01/22/20 199 lb (90.3 kg)  12/28/19 193 lb 12.8 oz (87.9 kg)  10/17/19 188 lb (85.3 kg)    Physical Exam Vitals and nursing note reviewed.  Constitutional:      General: He is not in acute distress.    Appearance: Normal appearance. He is ill-appearing. He is not toxic-appearing or diaphoretic.  HENT:     Head: Normocephalic and atraumatic.     Right Ear: External ear normal.     Left Ear: External ear normal.     Nose: Congestion present. No rhinorrhea.     Mouth/Throat:     Mouth: Mucous membranes are moist.     Pharynx: Oropharynx is clear. No oropharyngeal exudate or posterior oropharyngeal erythema.  Eyes:     General: No scleral icterus.    Extraocular Movements: Extraocular movements intact.  Cardiovascular:     Heart sounds: Normal heart sounds.  No murmur heard.   Pulmonary:     Effort: Pulmonary effort is normal. No respiratory distress.     Breath sounds: Wheezing present. No rhonchi or rales.  Abdominal:     General: Abdomen is flat.  Lymphadenopathy:     Cervical: No cervical adenopathy.  Skin:    General: Skin is warm and dry.     Coloration: Skin is not jaundiced or pale.     Findings: No erythema.  Neurological:     Mental Status: He is alert and oriented to person, place, and time.  Psychiatric:        Mood and Affect: Mood normal.        Behavior: Behavior normal.        Thought Content: Thought content normal.        Judgment: Judgment normal.        Assessment & Plan:   Problem List Items Addressed This Visit      Respiratory   Upper respiratory tract infection - Primary    Acute, ongoing for about a week.  Concerned for Covid given ongoing body aches, cough, shortness of breath, wheezing, nausea and vomiting with very little appetite.  Covid swab obtained today.  Given presentation and severity of symptoms, will obtain stat chest x-ray to rule out pneumonia.  In meantime, will treat for COPD exacerbation and follow patient closely.  Continue to use nebulizer if this is helping with breathing.  If breathing worsens or chest pain develops, go to ER.      Relevant Orders   SARS-COV-2 RNA,(COVID-19) QUAL NAAT   DG Chest 2 View (Completed)    Other Visit Diagnoses    Bilious vomiting with nausea       Relevant Medications   ondansetron (ZOFRAN ODT) 4 MG disintegrating tablet       Follow up plan: Return if symptoms worsen or fail to improve.

## 2020-05-01 NOTE — Assessment & Plan Note (Signed)
Acute, ongoing for about a week.  Concerned for Covid given ongoing body aches, cough, shortness of breath, wheezing, nausea and vomiting with very little appetite.  Covid swab obtained today.  Given presentation and severity of symptoms, will obtain stat chest x-ray to rule out pneumonia.  In meantime, will treat for COPD exacerbation and follow patient closely.  Continue to use nebulizer if this is helping with breathing.  If breathing worsens or chest pain develops, go to ER.

## 2020-05-03 LAB — SARS-COV-2 RNA,(COVID-19) QUALITATIVE NAAT: SARS CoV2 RNA: NOT DETECTED

## 2020-06-05 ENCOUNTER — Ambulatory Visit (INDEPENDENT_AMBULATORY_CARE_PROVIDER_SITE_OTHER): Payer: Medicare Other | Admitting: *Deleted

## 2020-06-05 DIAGNOSIS — I4891 Unspecified atrial fibrillation: Secondary | ICD-10-CM

## 2020-06-05 DIAGNOSIS — Z5181 Encounter for therapeutic drug level monitoring: Secondary | ICD-10-CM

## 2020-06-05 LAB — POCT INR: INR: 4.4 — AB (ref 2.0–3.0)

## 2020-06-05 NOTE — Patient Instructions (Signed)
Hold warfarin tonight, take 1/2 tablet tomorrow night then resume 1 tablet daily except 1 1/2 tablets on Tuesdays Recheck in 2 wks

## 2020-06-06 ENCOUNTER — Other Ambulatory Visit: Payer: Self-pay | Admitting: Family Medicine

## 2020-06-09 ENCOUNTER — Ambulatory Visit: Payer: Medicare Other | Admitting: Cardiology

## 2020-06-12 ENCOUNTER — Other Ambulatory Visit: Payer: Self-pay

## 2020-06-12 ENCOUNTER — Encounter: Payer: Self-pay | Admitting: Cardiology

## 2020-06-12 ENCOUNTER — Ambulatory Visit (INDEPENDENT_AMBULATORY_CARE_PROVIDER_SITE_OTHER): Payer: Medicare Other | Admitting: Cardiology

## 2020-06-12 VITALS — BP 138/74 | HR 91 | Ht 72.0 in | Wt 201.0 lb

## 2020-06-12 DIAGNOSIS — I2510 Atherosclerotic heart disease of native coronary artery without angina pectoris: Secondary | ICD-10-CM

## 2020-06-12 DIAGNOSIS — E782 Mixed hyperlipidemia: Secondary | ICD-10-CM | POA: Diagnosis not present

## 2020-06-12 DIAGNOSIS — I4891 Unspecified atrial fibrillation: Secondary | ICD-10-CM

## 2020-06-12 DIAGNOSIS — R0602 Shortness of breath: Secondary | ICD-10-CM | POA: Diagnosis not present

## 2020-06-12 MED ORDER — HYDRALAZINE HCL 50 MG PO TABS: 50.0000 mg | ORAL_TABLET | Freq: Three times a day (TID) | ORAL | 3 refills | Status: DC

## 2020-06-12 NOTE — Progress Notes (Signed)
Clinical Summary [name] is a 70 y.o.male seen today for follow up of the following medical problems.  1. Afib - eliquis was too expensive, changed to coumadin.   - no palpitations - no bleeding on coumadin   2. CAD -remotecath at [facility]. History of RCA CTO, received stent to OM1.    - no recent chest pain. Some ongoing SOB better with prn inhaler    3. HTN - compliant with meds    4. PAD - s/p repair of juxtarenal abdominal aortic aneurysm, left renal artery bypass, followed by vascular   5. Chronic sysotlic HF/ICM - ehco 10/9627 LVEF 35-40% - Jan 2019 echo LVEF 50%. Abnormal diastolic function - lasix 40mg  daily led to [facility], has done well with 40mg  alternating days with 20mg    -some sob, no LE edema -  SOB better with inhaler   6. HL - labs followed by pcp  01/2020 TC 145 HDL 30 TG 303 LDL 78   7. CKD IV - followed at [facility]  8. COPD - do not see formal PFTs in epic - has prn albuterol - some recent SOB  Past Medical History:  Diagnosis Date  . AAA (abdominal aortic aneurysm) ([place])    a. s/p repair 06/2015 with left renal artery bypass at that time, post-op course c/b renal failure requiring dialysis, C dif.  . Anemia   . Arteriosclerotic cardiovascular disease (ASCVD)    a. AMI in 2000 treated at [facility]. b. cath in 12/2006->  Chronic total obstruction of the RCA;  drug-eluting stent placed in OM1, LVEF abnormal.  . Cerebrovascular disease 2002   carotid stent  . Chronic anticoagulation   . Chronic combined systolic and diastolic CHF (congestive heart failure) ([place])   . CKD (chronic kidney disease), stage IV ([place])   . COPD (chronic obstructive pulmonary disease) ([place])   . GERD (gastroesophageal reflux disease)   . Hyperlipidemia   . Hypertension   . Myocardial infarction ([place]) 10 yrs ago  . Nephrolithiasis   . Permanent atrial fibrillation ([place])   . PVD (peripheral vascular disease) ([place])    Ct  angiogram in 2009 revealed stable disease with 80% celiac stenosis,50% right renal artery ,ASCVD with ulceration in the abdominal aortashe  . Testicular carcinoma ([place]) 1990   right orchiectomy  . Tobacco abuse, in remission    20 pack years; quit in 2009     No Known Allergies   Current Outpatient Medications  Medication Sig Dispense Refill  . pantoprazole (PROTONIX) 40 MG tablet Take 1 tablet by mouth once daily 90 tablet 0  . warfarin (COUMADIN) 5 MG tablet TAKE 1 TABLET BY MOUTH ONCE DAILY EXCEPT 1.5 TABLETS ON TUESDAYS 100 tablet 3  . albuterol (PROVENTIL) (2.5 MG/3ML) 0.083% nebulizer solution USE 1 VIAL IN NEBULIZER EVERY 4 HOURS AS NEEDED FOR WHEEZING FOR SHORTNESS OF BREATH 180 mL 0  . albuterol (VENTOLIN HFA) 108 (90 Base) MCG/ACT inhaler INHALE 2 TO 4 PUFFS BY MOUTH EVERY 4 HOURS AS NEEDED FOR WHEEZING FOR SHORTNESS OF BREATH 9 g 0  . amLODipine (NORVASC) 10 MG tablet Take 1 tablet by mouth once daily 90 tablet 2  . atorvastatin (LIPITOR) 20 MG tablet Take 1 tablet (20 mg total) by mouth daily. 30 tablet 3  . azithromycin (ZITHROMAX) 250 MG tablet Take 2 pills (500 mg) on day 1 and then 1 pill (250 mg) on days 2 through 5. 6 tablet 0  . calcitRIOL (ROCALTROL) 0.25 MCG capsule Take  0.25 mcg by mouth daily.     . ferrous sulfate 325 (65 FE) MG tablet Take 1 tablet (325 mg total) by mouth 3 (three) times daily. 180 tablet 1  . fluticasone (FLONASE) 50 MCG/ACT nasal spray Place 1 spray into both nostrils daily as needed for allergies or rhinitis.    . furosemide (LASIX) 40 MG tablet TAKE 1 TABLET ALTERNATING WITH ONE-HALF TABLET EVERY OTHER DAY 45 tablet 3  . hydrALAZINE (APRESOLINE) 25 MG tablet TAKE 1 & 1/2 (ONE & ONE-HALF) TABLETS BY MOUTH THREE TIMES DAILY 405 tablet 1  . metoprolol succinate (TOPROL-XL) 100 MG 24 hr tablet TAKE 1 & 1/2 (ONE & ONE-HALF) TABLETS BY MOUTH ONCE DAILY 135 tablet 1  . nitroGLYCERIN (NITROSTAT) 0.4 MG SL tablet place 1 tablet under the tongue if needed  every 5 minutes for chest pain for 3 doses IF NO RELIEF AFTER 3RD DOSE CALL PRESCRIBER OR 911. 25 tablet 2  . ondansetron (ZOFRAN ODT) 4 MG disintegrating tablet Take 1 tablet (4 mg total) by mouth every 8 (eight) hours as needed for nausea or vomiting. 20 tablet 0  . predniSONE (DELTASONE) 20 MG tablet Take 2 tablets (40 mg total) by mouth daily with breakfast. 10 tablet 0  . tamsulosin (FLOMAX) 0.4 MG CAPS capsule TAKE 1 CAPSULE BY MOUTH ONCE DAILY AFTER  SUPPER. 90 capsule 1   No current facility-administered medications for this visit.     Past Surgical History:  Procedure Laterality Date  . ABDOMINAL AORTIC ANEURYSM REPAIR N/A 06/09/2015   Procedure: ANEURYSM ABDOMINAL AORTIC REPAIR;  Surgeon: Elam Dutch, MD;  Location: [facility] OR;  Service: Vascular;  Laterality: N/A;  . AORTIC ENDARTERECETOMY N/A 06/09/2015   Procedure: AORTIC ENDARTERECETOMY;  Surgeon: Elam Dutch, MD;  Location: [place];  Service: Vascular;  Laterality: N/A;  . AORTIC/RENAL BYPASS Left 06/09/2015   Procedure: LEFT RENAL Artery BYPASS;  Surgeon: Elam Dutch, MD;  Location: [place];  Service: Vascular;  Laterality: Left;  . APPENDECTOMY  2004  . CHOLECYSTECTOMY N/A 09/18/2018   Procedure: LAPAROSCOPIC CHOLECYSTECTOMY;  Surgeon: Aviva Signs, MD;  Location: [facility] ORS;  Service: General;  Laterality: N/A;  . COLONOSCOPY  06/17/2011   INCOMPLETE, PREP POOR. Procedure: COLONOSCOPY;  Surgeon: Daneil Dolin, MD;  Location: [facility] ENDO SUITE;  Service: Endoscopy;  Laterality: N/A;  10:00  . COLONOSCOPY  07/15/2011   [id number] rectal and colon polyps  . COLONOSCOPY N/A 05/22/2015   [id number] colonic and rectal polyps. tubular adenomas.repeat TCS 05/2018  . ESOPHAGEAL DILATION N/A 05/22/2015   Procedure: ESOPHAGEAL DILATION;  Surgeon: Daneil Dolin, MD;  Location[facility];  Service: Endoscopy;  Laterality: N/A;  . ESOPHAGOGASTRODUODENOSCOPY  06/17/2011   severe erosive/ulcerative reflux esophagitis, soft noncritical  stricture dilatied, small hh, antral erosion   . ESOPHAGOGASTRODUODENOSCOPY N/A 05/22/2015   RMR: 2 cm HH otherwise normal. s/p empirical dilation.  . INSERTION OF DIALYSIS CATHETER Left 06/26/2015   Procedure: INSERTION OF DIALYSIS CATHETER;  Surgeon: Angelia Mould, MD;  Location: [place];  Service: Vascular;  Laterality: Left;  . PERIPHERAL VASCULAR CATHETERIZATION N/A 05/28/2015   Procedure: Abdominal Aortogram;  Surgeon: Serafina Mitchell, MD;  Location: [place] [facility] LAB;  Service: Cardiovascular;  Laterality: N/A;  . testicular cancer  1990   right orchiectomy     No Known Allergies    Family History  Problem Relation Age of Onset  . Colon cancer Father 25       deceased  . Prostate  cancer Father   . Heart disease Mother   . Liver disease Neg Hx   . Anesthesia problems Neg Hx   . Hypotension Neg Hx   . Malignant hyperthermia Neg Hx   . Pseudochol deficiency Neg Hx      Social History Mr. Lizarraga reports that he quit smoking about 15 months ago. His smoking use included cigarettes. He has a 20.00 pack-year smoking history. He has never used smokeless tobacco. Mr. Sees reports no history of alcohol use.   Review of Systems CONSTITUTIONAL: No weight loss, fever, chills, weakness or fatigue.  HEENT: Eyes: No visual loss, blurred vision, double vision or yellow sclerae.No hearing loss, sneezing, congestion, runny nose or sore throat.  SKIN: No rash or itching.  CARDIOVASCULAR: per hpi RESPIRATORY: No shortness of breath, cough or sputum.  GASTROINTESTINAL: No anorexia, nausea, vomiting or diarrhea. No abdominal pain or blood.  GENITOURINARY: No burning on urination, no polyuria NEUROLOGICAL: No headache, dizziness, syncope, paralysis, ataxia, numbness or tingling in the extremities. No change in bowel or bladder control.  MUSCULOSKELETAL: No muscle, back pain, joint pain or stiffness.  LYMPHATICS: No enlarged nodes. No history of splenectomy.  PSYCHIATRIC: No history  of depression or anxiety.  ENDOCRINOLOGIC: No reports of sweating, cold or heat intolerance. No polyuria or polydipsia.  [name]   Physical Examination Today's Vitals   06/12/20 1013  BP: 138/74  Pulse: 91  SpO2: 97%  Weight: 201 lb (91.2 kg)  Height: 6' (1.829 m)   Body mass index is 27.26 kg/m.  Gen: resting comfortably, no acute distress HEENT: no scleral icterus, pupils equal round and reactive, no palptable cervical adenopathy,  CV: irreg, no m/r/g, no jvd Resp: Clear to auscultation bilaterally GI: abdomen is soft, non-tender, non-distended, normal bowel sounds, no hepatosplenomegaly MSK: extremities are warm, no edema.  Skin: warm, no rash Neuro:  no focal deficits Psych: appropriate affect   Diagnostic Studies 06/2015 echo - Left ventricle: The cavity size was normal. Systolic function was  moderately reduced. The estimated ejection fraction was in the  range of 35% to 40%. - Pulmonary arteries: PA peak pressure: 33 mm Hg (S). - Inferior vena cava: The vessel was dilated. The respirophasic  diameter changes were blunted (<50%). This is a nonspecific  finding during positive pressure ventilation.  Impressions:  - Technically limited study due to poor echo windows and  tachycardia. Consider TEE if more accurate evaluation is  necessary.   Jan 2019 echo Study Conclusions  - Left ventricle: The cavity size was normal. Wall thickness was increased in a pattern of mild LVH. Systolic function was at the lower limits of normal. The estimated ejection fraction was 50%. Diffuse hypokinesis. The study was not technically sufficient to allow evaluation of LV diastolic dysfunction due to atrial fibrillation. Doppler parameters are consistent with high ventricular filling pressure. - Aortic valve: Trileaflet; mildly thickened leaflets. - Mitral valve: There was mild regurgitation. - Left atrium: The atrium was moderately dilated. - Right atrium:  The atrium was moderately dilated.    Assessment and Plan   1. Afib -  NOACs too expensive, continue coumadin - no symptoms continue current meds  2. CAD - no symptoms, continue current meds  3. HTN -above goal, increase hydralazine to 50mg  tid  4.Ischemic cardiopmyopathy -LVEF has normalized -remains euvolemic  5. Hyperlipidemia - LDL reasonable, we discussed dietary changes for his TGs  6. COPD - some recent SOB - has prn albuterol - do not see forma pFTs in system,  will order. May require maintence therpy depending on findings.     Arnoldo Lenis, M.D.

## 2020-06-12 NOTE — Patient Instructions (Signed)
Medication Instructions:  Your physician has recommended you make the following change in your medication:   Increase Hydralazine to 50 mg Three Times Daily   *If you need a refill on your cardiac medications before your next appointment, please call your pharmacy*   Lab Work: NONE   If you have labs (blood work) drawn today and your tests are completely normal, you will receive your results only by: [name] MyChart Message (if you have MyChart) OR . A paper copy in the mail If you have any lab test that is abnormal or we need to change your treatment, we will call you to review the results.   Testing/Procedures: Your physician has recommended that you have a pulmonary function test. Pulmonary Function Tests are a group of tests that measure how well air moves in and out of your lungs.   Follow-Up: At [facility], you and your health needs are our priority.  As part of our continuing mission to provide you with exceptional heart care, we have created designated Provider Care Teams.  These Care Teams include your primary Cardiologist (physician) and Advanced Practice Providers (APPs -  Physician Assistants and Nurse Practitioners) who all work together to provide you with the care you need, when you need it.  We recommend signing up for the patient portal called "MyChart".  Sign up information is provided on this After Visit Summary.  MyChart is used to connect with patients for Virtual Visits (Telemedicine).  Patients are able to view lab/test results, encounter notes, upcoming appointments, etc.  Non-urgent messages can be sent to your provider as well.   To learn more about what you can do with MyChart, go to [email or web address].    Your next appointment:   6 month(s)  The format for your next appointment:   In Person  Provider:   Carlyle Dolly, MD   Other Instructions Thank you for choosing [facility]!

## 2020-06-19 ENCOUNTER — Ambulatory Visit (INDEPENDENT_AMBULATORY_CARE_PROVIDER_SITE_OTHER): Payer: Medicare Other | Admitting: *Deleted

## 2020-06-19 DIAGNOSIS — Z5181 Encounter for therapeutic drug level monitoring: Secondary | ICD-10-CM

## 2020-06-19 DIAGNOSIS — I4891 Unspecified atrial fibrillation: Secondary | ICD-10-CM

## 2020-06-19 LAB — POCT INR: INR: 3.2 — AB (ref 2.0–3.0)

## 2020-06-19 NOTE — Patient Instructions (Signed)
Hold warfarin tonight then decrease dose to 1 tablet daily Recheck in 3 wks

## 2020-06-20 ENCOUNTER — Other Ambulatory Visit (HOSPITAL_COMMUNITY)
Admission: RE | Admit: 2020-06-20 | Discharge: 2020-06-20 | Disposition: A | Discharge status: Home / Self Care | Payer: Medicare Other | Source: Ambulatory Visit | Attending: Cardiology | Admitting: Cardiology

## 2020-06-20 ENCOUNTER — Other Ambulatory Visit: Payer: Self-pay

## 2020-06-20 DIAGNOSIS — Z01812 Encounter for preprocedural laboratory examination: Secondary | ICD-10-CM | POA: Insufficient documentation

## 2020-06-20 DIAGNOSIS — Z20822 Contact with and (suspected) exposure to covid-19: Secondary | ICD-10-CM | POA: Insufficient documentation

## 2020-06-20 LAB — SARS CORONAVIRUS 2 (TAT 6-24 HRS): SARS Coronavirus 2: NEGATIVE

## 2020-06-24 ENCOUNTER — Other Ambulatory Visit: Payer: Self-pay

## 2020-06-24 ENCOUNTER — Ambulatory Visit (HOSPITAL_COMMUNITY)
Admission: RE | Admit: 2020-06-24 | Discharge: 2020-06-24 | Disposition: A | Discharge status: Home / Self Care | Payer: Medicare Other | Source: Ambulatory Visit | Attending: Cardiology | Admitting: Cardiology

## 2020-06-24 DIAGNOSIS — R0602 Shortness of breath: Secondary | ICD-10-CM | POA: Diagnosis not present

## 2020-06-24 LAB — PULMONARY FUNCTION TEST
DL/VA % pred: 74 %
DL/VA: 3.04 ml/min/mmHg/L
DLCO unc % pred: 56 %
DLCO unc: 15.75 ml/min/mmHg
FEF 25-75 Post: 1.07 L/sec
FEF 25-75 Pre: 0.94 L/sec
FEF2575-%Change-Post: 13 %
FEF2575-%Pred-Post: 37 %
FEF2575-%Pred-Pre: 33 %
FEV1-%Change-Post: 4 %
FEV1-%Pred-Post: 48 %
FEV1-%Pred-Pre: 46 %
FEV1-Post: 1.76 L
FEV1-Pre: 1.68 L
FEV1FVC-%Change-Post: 3 %
FEV1FVC-%Pred-Pre: 87 %
FEV6-%Change-Post: 0 %
FEV6-%Pred-Post: 54 %
FEV6-%Pred-Pre: 54 %
FEV6-Post: 2.54 L
FEV6-Pre: 2.52 L
FEV6FVC-%Change-Post: 1 %
FEV6FVC-%Pred-Post: 104 %
FEV6FVC-%Pred-Pre: 103 %
FVC-%Change-Post: 0 %
FVC-%Pred-Post: 53 %
FVC-%Pred-Pre: 53 %
FVC-Post: 2.62 L
FVC-Pre: 2.6 L
Post FEV1/FVC ratio: 67 %
Post FEV6/FVC ratio: 99 %
Pre FEV1/FVC ratio: 65 %
Pre FEV6/FVC Ratio: 98 %
RV % pred: 152 %
RV: 3.8 L
TLC % pred: 88 %
TLC: 6.6 L

## 2020-06-24 MED ORDER — ALBUTEROL SULFATE (2.5 MG/3ML) 0.083% IN NEBU
2.5000 mg | INHALATION_SOLUTION | Freq: Once | RESPIRATORY_TRACT | Status: AC
  Administered 2020-06-24: 2.5 mg via RESPIRATORY_TRACT

## 2020-07-01 ENCOUNTER — Other Ambulatory Visit: Payer: Self-pay | Admitting: Family Medicine

## 2020-07-09 ENCOUNTER — Other Ambulatory Visit: Payer: Self-pay | Admitting: Family Medicine

## 2020-07-10 ENCOUNTER — Ambulatory Visit (INDEPENDENT_AMBULATORY_CARE_PROVIDER_SITE_OTHER): Payer: Medicare Other | Admitting: *Deleted

## 2020-07-10 DIAGNOSIS — Z5181 Encounter for therapeutic drug level monitoring: Secondary | ICD-10-CM | POA: Diagnosis not present

## 2020-07-10 DIAGNOSIS — I4891 Unspecified atrial fibrillation: Secondary | ICD-10-CM

## 2020-07-10 LAB — POCT INR: INR: 1.9 — AB (ref 2.0–3.0)

## 2020-07-10 NOTE — Patient Instructions (Signed)
Increase warfarin to 1 tablet daily except 1 1/2 tablets on Thursdays Recheck in 4 wks

## 2020-07-11 ENCOUNTER — Telehealth: Payer: Self-pay | Admitting: *Deleted

## 2020-07-11 NOTE — Telephone Encounter (Signed)
-----   Message from Arnoldo Lenis, MD sent at 07/07/2020  6:48 PM EST ----- Breathing tests are abnormal, we will forward to pcp. They may consider adjustement in inhalers or possible pulmonary evaluation  Zandra Abts MD

## 2020-07-11 NOTE — Telephone Encounter (Signed)
Pt voiced understanding

## 2020-07-17 ENCOUNTER — Other Ambulatory Visit: Payer: Self-pay | Admitting: Family Medicine

## 2020-07-24 ENCOUNTER — Other Ambulatory Visit: Payer: Self-pay | Admitting: *Deleted

## 2020-07-24 DIAGNOSIS — R1114 Bilious vomiting: Secondary | ICD-10-CM

## 2020-07-24 DIAGNOSIS — J069 Acute upper respiratory infection, unspecified: Secondary | ICD-10-CM

## 2020-07-24 DIAGNOSIS — J449 Chronic obstructive pulmonary disease, unspecified: Secondary | ICD-10-CM

## 2020-07-24 DIAGNOSIS — J441 Chronic obstructive pulmonary disease with (acute) exacerbation: Secondary | ICD-10-CM

## 2020-07-29 ENCOUNTER — Other Ambulatory Visit: Payer: Self-pay | Admitting: Family Medicine

## 2020-08-05 ENCOUNTER — Other Ambulatory Visit: Payer: Self-pay | Admitting: Family Medicine

## 2020-08-07 ENCOUNTER — Other Ambulatory Visit: Payer: Self-pay

## 2020-08-07 ENCOUNTER — Ambulatory Visit (INDEPENDENT_AMBULATORY_CARE_PROVIDER_SITE_OTHER): Payer: Medicare Other | Admitting: *Deleted

## 2020-08-07 DIAGNOSIS — Z5181 Encounter for therapeutic drug level monitoring: Secondary | ICD-10-CM | POA: Diagnosis not present

## 2020-08-07 DIAGNOSIS — I4891 Unspecified atrial fibrillation: Secondary | ICD-10-CM | POA: Diagnosis not present

## 2020-08-07 LAB — POCT INR: INR: 3.2 — AB (ref 2.0–3.0)

## 2020-08-07 NOTE — Patient Instructions (Signed)
Decrease warfarin to 1 tablet daily  Recheck in 4 wks

## 2020-08-08 ENCOUNTER — Other Ambulatory Visit: Payer: Self-pay | Admitting: *Deleted

## 2020-08-18 ENCOUNTER — Other Ambulatory Visit: Payer: Self-pay | Admitting: Student

## 2020-08-24 ENCOUNTER — Encounter (HOSPITAL_COMMUNITY): Payer: Self-pay

## 2020-08-24 ENCOUNTER — Other Ambulatory Visit: Payer: Self-pay

## 2020-08-24 ENCOUNTER — Emergency Department (HOSPITAL_COMMUNITY)
Admission: EM | Admit: 2020-08-24 | Discharge: 2020-08-25 | Disposition: A | Discharge status: Home / Self Care | Payer: Medicare Other | Attending: Emergency Medicine | Admitting: Emergency Medicine

## 2020-08-24 ENCOUNTER — Emergency Department (HOSPITAL_COMMUNITY): Payer: Medicare Other

## 2020-08-24 DIAGNOSIS — R0602 Shortness of breath: Secondary | ICD-10-CM | POA: Diagnosis not present

## 2020-08-24 DIAGNOSIS — J811 Chronic pulmonary edema: Secondary | ICD-10-CM | POA: Diagnosis not present

## 2020-08-24 DIAGNOSIS — Z7901 Long term (current) use of anticoagulants: Secondary | ICD-10-CM | POA: Insufficient documentation

## 2020-08-24 DIAGNOSIS — I130 Hypertensive heart and chronic kidney disease with heart failure and stage 1 through stage 4 chronic kidney disease, or unspecified chronic kidney disease: Secondary | ICD-10-CM | POA: Diagnosis not present

## 2020-08-24 DIAGNOSIS — Z79899 Other long term (current) drug therapy: Secondary | ICD-10-CM | POA: Diagnosis not present

## 2020-08-24 DIAGNOSIS — I517 Cardiomegaly: Secondary | ICD-10-CM | POA: Diagnosis not present

## 2020-08-24 DIAGNOSIS — Z8547 Personal history of malignant neoplasm of testis: Secondary | ICD-10-CM | POA: Insufficient documentation

## 2020-08-24 DIAGNOSIS — Z20822 Contact with and (suspected) exposure to covid-19: Secondary | ICD-10-CM | POA: Diagnosis not present

## 2020-08-24 DIAGNOSIS — J441 Chronic obstructive pulmonary disease with (acute) exacerbation: Secondary | ICD-10-CM | POA: Insufficient documentation

## 2020-08-24 DIAGNOSIS — N184 Chronic kidney disease, stage 4 (severe): Secondary | ICD-10-CM | POA: Insufficient documentation

## 2020-08-24 DIAGNOSIS — Z87891 Personal history of nicotine dependence: Secondary | ICD-10-CM | POA: Diagnosis not present

## 2020-08-24 DIAGNOSIS — I5042 Chronic combined systolic (congestive) and diastolic (congestive) heart failure: Secondary | ICD-10-CM | POA: Insufficient documentation

## 2020-08-24 LAB — BASIC METABOLIC PANEL
Anion gap: 10 (ref 5–15)
BUN: 29 mg/dL — ABNORMAL HIGH (ref 8–23)
CO2: 21 mmol/L — ABNORMAL LOW (ref 22–32)
Calcium: 9.5 mg/dL (ref 8.9–10.3)
Chloride: 107 mmol/L (ref 98–111)
Creatinine, Ser: 3.41 mg/dL — ABNORMAL HIGH (ref 0.61–1.24)
GFR, Estimated: 19 mL/min — ABNORMAL LOW (ref >60)
Glucose, Bld: 112 mg/dL — ABNORMAL HIGH (ref 70–99)
Potassium: 4.3 mmol/L (ref 3.5–5.1)
Sodium: 138 mmol/L (ref 135–145)

## 2020-08-24 LAB — CBC WITH DIFFERENTIAL/PLATELET
Abs Immature Granulocytes: 0.02 10*3/uL (ref 0.00–0.07)
Basophils Absolute: 0 10*3/uL (ref 0.0–0.1)
Basophils Relative: 1 %
Eosinophils Absolute: 0 10*3/uL (ref 0.0–0.5)
Eosinophils Relative: 1 %
HCT: 41.4 % (ref 39.0–52.0)
Hemoglobin: 13.4 g/dL (ref 13.0–17.0)
Immature Granulocytes: 1 %
Lymphocytes Relative: 18 %
Lymphs Abs: 0.6 10*3/uL — ABNORMAL LOW (ref 0.7–4.0)
MCH: 30 pg (ref 26.0–34.0)
MCHC: 32.4 g/dL (ref 30.0–36.0)
MCV: 92.8 fL (ref 80.0–100.0)
Monocytes Absolute: 0.6 10*3/uL (ref 0.1–1.0)
Monocytes Relative: 17 %
Neutro Abs: 2.2 10*3/uL (ref 1.7–7.7)
Neutrophils Relative %: 62 %
Platelets: 156 10*3/uL (ref 150–400)
RBC: 4.46 MIL/uL (ref 4.22–5.81)
RDW: 14 % (ref 11.5–15.5)
WBC: 3.5 10*3/uL — ABNORMAL LOW (ref 4.0–10.5)
nRBC: 0 % (ref 0.0–0.2)

## 2020-08-24 LAB — PROTIME-INR
INR: 2.8 — ABNORMAL HIGH (ref 0.8–1.2)
Prothrombin Time: 29.3 seconds — ABNORMAL HIGH (ref 11.4–15.2)

## 2020-08-24 LAB — RESP PANEL BY RT-PCR (FLU A&B, COVID) ARPGX2
Influenza A by PCR: NEGATIVE
Influenza B by PCR: NEGATIVE
SARS Coronavirus 2 by RT PCR: NEGATIVE

## 2020-08-24 LAB — BRAIN NATRIURETIC PEPTIDE: B Natriuretic Peptide: 200 pg/mL — ABNORMAL HIGH (ref 0.0–100.0)

## 2020-08-24 MED ORDER — METHYLPREDNISOLONE SODIUM SUCC 125 MG IJ SOLR
125.0000 mg | Freq: Once | INTRAMUSCULAR | Status: AC
  Administered 2020-08-24: 125 mg via INTRAVENOUS
  Filled 2020-08-24: qty 2

## 2020-08-24 MED ORDER — IPRATROPIUM-ALBUTEROL 0.5-2.5 (3) MG/3ML IN SOLN: 3.0000 mL | Freq: Once | RESPIRATORY_TRACT | Status: DC

## 2020-08-24 MED ORDER — ALBUTEROL (5 MG/ML) CONTINUOUS INHALATION SOLN
10.0000 mg/h | INHALATION_SOLUTION | RESPIRATORY_TRACT | Status: DC
  Administered 2020-08-24: 10 mg/h via RESPIRATORY_TRACT
  Filled 2020-08-24: qty 20

## 2020-08-24 NOTE — ED Triage Notes (Signed)
Sob for a few days tried home inhalers with no help.

## 2020-08-24 NOTE — ED Provider Notes (Signed)
[facility] EMERGENCY DEPARTMENT Provider Note   CSN: [id number] Arrival date & time: 08/24/20  2049     History Chief Complaint  Patient presents with  . Shortness of Breath    [name] [name] is a 70 y.o. male.  Pt presents to the ED today with sob.  The pt said he's been sob for a few days.  He's been using his inhalers, but they have not been helping.  Pt denies f/c.  He has been vaccinated against Covid, but no booster.         Past Medical History:  Diagnosis Date  . AAA (abdominal aortic aneurysm) ([place])    a. s/p repair 06/2015 with left renal artery bypass at that time, post-op course c/b renal failure requiring dialysis, C dif.  . Anemia   . Arteriosclerotic cardiovascular disease (ASCVD)    a. AMI in 2000 treated at [facility]. b. cath in 12/2006->  Chronic total obstruction of the RCA;  drug-eluting stent placed in OM1, LVEF abnormal.  . Cerebrovascular disease 2002   carotid stent  . Chronic anticoagulation   . Chronic combined systolic and diastolic CHF (congestive heart failure) ([place])   . CKD (chronic kidney disease), stage IV ([place])   . COPD (chronic obstructive pulmonary disease) ([place])   . GERD (gastroesophageal reflux disease)   . Hyperlipidemia   . Hypertension   . Myocardial infarction ([place]) 10 yrs ago  . Nephrolithiasis   . Permanent atrial fibrillation ([place])   . PVD (peripheral vascular disease) ([place])    Ct angiogram in 2009 revealed stable disease with 80% celiac stenosis,50% right renal artery ,ASCVD with ulceration in the abdominal aortashe  . Testicular carcinoma ([place]) 1990   right orchiectomy  . Tobacco abuse, in remission    20 pack years; quit in 2009    Patient Active Problem List   Diagnosis Date Noted  . Upper respiratory tract infection 05/01/2020  . COPD exacerbation ([place]) 05/01/2020  . Iron deficiency anemia 01/22/2020  . Chronic combined systolic and diastolic CHF (congestive heart failure) ([place]) 08/20/2019  . Paroxysmal A-fib ([place])  08/20/2019  . Prolonged QT interval 08/20/2019  . COPD (chronic obstructive pulmonary disease) ([place])   . Follow-up examination following surgery 09/28/2018  . Calculus of gallbladder with acute cholecystitis without obstruction   . Status post abdominal aortic aneurysm (AAA) repair   . History of CVA with residual deficit   . Pseudoaneurysm of aorta ([place]) 06/09/2015  . CAD - s/p PCI to Cx. 100% RCA CTO 05/28/2015  . Hiatal hernia   . History of colonic polyps   . Hx of adenomatous colonic polyps 05/14/2015  . Constipation 04/22/2015  . Insomnia 07/16/2014  . ED (erectile dysfunction) 06/17/2013  . Chronic kidney disease 09/28/2011  . GERD (gastroesophageal reflux disease) 06/01/2011  . Family history of colon cancer 05/20/2011  . Chronic anticoagulation 09/24/2010  . Tobacco abuse   . ATHEROSCLEROTIC CARDIOVASCULAR DISEASE 11/13/2009  . PERIPHERAL VASCULAR DISEASE 11/13/2009  . Hyperlipidemia 10/24/2008  . History of testicular cancer 10/22/2008  . Essential hypertension 10/22/2008    Past Surgical History:  Procedure Laterality Date  . ABDOMINAL AORTIC ANEURYSM REPAIR N/A 06/09/2015   Procedure: ANEURYSM ABDOMINAL AORTIC REPAIR;  Surgeon: Elam Dutch, MD;  Location: [place];  Service: Vascular;  Laterality: N/A;  . AORTIC ENDARTERECETOMY N/A 06/09/2015   Procedure: AORTIC ENDARTERECETOMY;  Surgeon: Elam Dutch, MD;  Location: [place];  Service: Vascular;  Laterality: N/A;  . AORTIC/RENAL BYPASS Left 06/09/2015  Procedure: LEFT RENAL Artery BYPASS;  Surgeon: Elam Dutch, MD;  Location: [place];  Service: Vascular;  Laterality: Left;  . APPENDECTOMY  2004  . CHOLECYSTECTOMY N/A 09/18/2018   Procedure: LAPAROSCOPIC CHOLECYSTECTOMY;  Surgeon: Aviva Signs, MD;  Location: [facility];  Service: General;  Laterality: N/A;  . COLONOSCOPY  06/17/2011   INCOMPLETE, PREP POOR. Procedure: COLONOSCOPY;  Surgeon: Daneil Dolin, MD;  [facility];  Service: Endoscopy;  Laterality:  N/A;  10:00  . COLONOSCOPY  07/15/2011   QPR:[id number] rectal and colon polyps  . COLONOSCOPY N/A 05/22/2015   ZLD:[id number] colonic and rectal polyps. tubular adenomas.repeat TCS 05/2018  . ESOPHAGEAL DILATION N/A 05/22/2015   Procedure: ESOPHAGEAL DILATION;  Surgeon: Daneil Dolin, MD;  [facility];  Service: Endoscopy;  Laterality: N/A;  . ESOPHAGOGASTRODUODENOSCOPY  06/17/2011   severe erosive/ulcerative reflux esophagitis, soft noncritical stricture dilatied, small hh, antral erosion   . ESOPHAGOGASTRODUODENOSCOPY N/A 05/22/2015   RMR: 2 cm HH otherwise normal. s/p empirical dilation.  . INSERTION OF DIALYSIS CATHETER Left 06/26/2015   Procedure: INSERTION OF DIALYSIS CATHETER;  Surgeon: Angelia Mould, MD;  Location: [place];  Service: Vascular;  Laterality: Left;  . PERIPHERAL VASCULAR CATHETERIZATION N/A 05/28/2015   Procedure: Abdominal Aortogram;  Surgeon: Serafina Mitchell, MD;  Location: [place] [facility];  Service: Cardiovascular;  Laterality: N/A;  . testicular cancer  1990   right orchiectomy       Family History  Problem Relation Age of Onset  . Colon cancer Father 47       deceased  . Prostate cancer Father   . Heart disease Mother   . Liver disease Neg Hx   . Anesthesia problems Neg Hx   . Hypotension Neg Hx   . Malignant hyperthermia Neg Hx   . Pseudochol deficiency Neg Hx     Social History   Tobacco Use  . Smoking status: Former Smoker    Packs/day: 0.50    Years: 40.00    Pack years: 20.00    Types: Cigarettes    Quit date: 03/02/2019    Years since quitting: 1.4  . Smokeless tobacco: Never Used  . Tobacco comment: 1/2 pack daily  Vaping Use  . Vaping Use: Never used  Substance Use Topics  . Alcohol use: No    Alcohol/week: 0.0 standard drinks  . Drug use: No    Home Medications Prior to Admission medications   Medication Sig Start Date End Date Taking? Authorizing Provider  albuterol (PROVENTIL) (2.5 MG/3ML) 0.083% nebulizer  solution USE 1 VIAL IN NEBULIZER EVERY 4 HOURS AS NEEDED FOR WHEEZING FOR SHORTNESS OF BREATH 06/06/20   Alycia Rossetti, MD  albuterol (VENTOLIN HFA) 108 (90 Base) MCG/ACT inhaler INHALE 2 TO 4 PUFFS BY MOUTH EVERY 4 HOURS AS NEEDED FOR WHEEZING OR SHORTNESS OF BREATH 08/05/20   Susy Frizzle, MD  amLODipine (NORVASC) 10 MG tablet Take 1 tablet by mouth once daily 01/15/20   Alycia Rossetti, MD  atorvastatin (LIPITOR) 20 MG tablet Take 1 tablet (20 mg total) by mouth daily. 09/02/19   Alycia Rossetti, MD  calcitRIOL (ROCALTROL) 0.25 MCG capsule Take 0.25 mcg by mouth daily.     [provider]  Ferrous Sulfate (IRON) 325 (65 Fe) MG TABS TAKE 1 TABLET BY MOUTH THREE TIMES DAILY 07/09/20   Alycia Rossetti, MD  fluticasone [facility]) 50 MCG/ACT nasal spray Place 1 spray into both nostrils daily as needed for allergies  or rhinitis.    [provider]  furosemide (LASIX) 40 MG tablet TAKE 1 TABLET ALTERNATING WITH ONE-HALF TABLET EVERY OTHER DAY 02/25/20   Arnoldo Lenis, MD  hydrALAZINE (APRESOLINE) 50 MG tablet Take 1 tablet (50 mg total) by mouth 3 (three) times daily. 06/12/20 09/10/20  Arnoldo Lenis, MD  metoprolol succinate (TOPROL-XL) 100 MG 24 hr tablet TAKE 1 & 1/2 (ONE & ONE-HALF) TABLETS BY MOUTH ONCE DAILY 08/18/20   Arnoldo Lenis, MD  nitroGLYCERIN (NITROSTAT) 0.4 MG SL tablet place 1 tablet under the tongue if needed every 5 minutes for chest pain for 3 doses IF NO RELIEF AFTER 3RD DOSE CALL PRESCRIBER OR 911. 10/25/16   Lendon Colonel, NP  pantoprazole (PROTONIX) 40 MG tablet Take 1 tablet by mouth once daily 07/01/20   Alycia Rossetti, MD  tamsulosin (FLOMAX) 0.4 MG CAPS capsule TAKE 1 CAPSULE BY MOUTH ONCE DAILY AFTER  SUPPER. 01/22/20   Alycia Rossetti, MD  warfarin (COUMADIN) 5 MG tablet TAKE 1 TABLET BY MOUTH ONCE DAILY EXCEPT 1.5 TABLETS ON TUESDAYS 04/07/20   Arnoldo Lenis, MD    Allergies    Patient has no known allergies.  Review of  Systems   Review of Systems  Respiratory: Positive for cough, shortness of breath and wheezing.   All other systems reviewed and are negative.   Physical Exam Updated Vital Signs BP (!) 162/83   Pulse 92   Temp 98.8 F (37.1 C) (Oral)   Resp (!) 28   Ht 6' (1.829 m)   Wt 90.7 kg   SpO2 94%   BMI 27.12 kg/m   Physical Exam Vitals and nursing note reviewed.  Constitutional:      Appearance: He is well-developed.  HENT:     Head: Normocephalic and atraumatic.     Mouth/Throat:     Mouth: Mucous membranes are moist.     Pharynx: Oropharynx is clear.  Eyes:     Extraocular Movements: Extraocular movements intact.     Pupils: Pupils are equal, round, and reactive to light.  Cardiovascular:     Rate and Rhythm: Normal rate and regular rhythm.  Pulmonary:     Effort: Tachypnea present.     Breath sounds: Wheezing present.  Abdominal:     General: Bowel sounds are normal.     Palpations: Abdomen is soft.  Musculoskeletal:        General: Normal range of motion.     Cervical back: Normal range of motion and neck supple.  Skin:    General: Skin is warm.     Capillary Refill: Capillary refill takes less than 2 seconds.  Neurological:     General: No focal deficit present.     Mental Status: He is alert and oriented to person, place, and time.  Psychiatric:        Mood and Affect: Mood normal.        Behavior: Behavior normal.     ED Results / Procedures / Treatments   Labs (all labs ordered are listed, but only abnormal results are displayed) Labs Reviewed  BASIC METABOLIC PANEL - Abnormal; Notable for the following components:      Result Value   CO2 21 (*)    Glucose, Bld 112 (*)    BUN 29 (*)    Creatinine, Ser 3.41 (*)    GFR, Estimated 19 (*)    All other components within normal limits  BRAIN NATRIURETIC PEPTIDE - Abnormal; Notable  for the following components:   B Natriuretic Peptide 200.0 (*)    All other components within normal limits  PROTIME-INR -  Abnormal; Notable for the following components:   Prothrombin Time 29.3 (*)    INR 2.8 (*)    All other components within normal limits  CBC WITH DIFFERENTIAL/PLATELET - Abnormal; Notable for the following components:   WBC 3.5 (*)    Lymphs Abs 0.6 (*)    All other components within normal limits  RESP PANEL BY RT-PCR (FLU A&B, COVID) [id number]    EKG EKG Interpretation  Date/Time:  Sunday August 24 2020 21:12:02 EDT Ventricular Rate:  88 PR Interval:    QRS Duration: 109 QT Interval:  421 QTC Calculation: 507 R Axis:   -33 Text Interpretation: Atrial fibrillation Left axis deviation Borderline repol abnormality, diffuse leads Prolonged QT interval Baseline wander in lead(s) V2 No significant change since last tracing Confirmed by Isla Pence [phone]) on 08/24/2020 9:44:13 PM   Radiology DG Chest Port 1 View  Result Date: 08/24/2020 CLINICAL DATA:  Shortness of breath EXAM: PORTABLE CHEST 1 VIEW COMPARISON:  May 01, 2020 FINDINGS: Unchanged mild cardiomegaly with mild pulmonary vascular congestion. No focal consolidation or overt pulmonary edema. No significant pleural effusion or visible pneumothorax. The visualized skeletal structures are unremarkable. IMPRESSION: Unchanged mild cardiomegaly with mild pulmonary vascular congestion. No focal consolidation or overt pulmonary edema. Electronically Signed   By: Dahlia Bailiff MD   On: 08/24/2020 21:34    Procedures Procedures   Medications Ordered in ED Medications  albuterol (PROVENTIL,VENTOLIN) solution continuous neb (10 mg/hr Nebulization New Bag/Given 08/24/20 2311)  methylPREDNISolone sodium succinate (SOLU-MEDROL) 125 mg/2 mL injection 125 mg (125 mg Intravenous Given 08/24/20 2152)    ED Course  I have reviewed the triage vital signs and the nursing notes.  Pertinent labs & imaging results that were available during my care of the patient were reviewed by me and considered in my medical decision making (see chart for  details).    MDM Rules/Calculators/A&P                           Pt given 125 mg solumedrol.  He was able to get a continuous neb after the covid test came back negative.  The nebs have just been started at shift change.  Pt CXR is unchanged.  He has slight worsening of his CKD.    Pt is signed out to Dr. Karle Starch at shift change.  [name] [name] was evaluated in Emergency Department on 08/24/2020 for the symptoms described in the history of present illness. He was evaluated in the context of the global COVID-19 pandemic, which necessitated consideration that the patient might be at risk for infection with the SARS-CoV-2 virus that causes COVID-19. Institutional protocols and algorithms that pertain to the evaluation of patients at risk for COVID-19 are in a state of rapid change based on information released by regulatory bodies including the CDC and federal and state organizations. These policies and algorithms were followed during the patient's care in the ED. Final Clinical Impression(s) / ED Diagnoses Final diagnoses:  None    Rx / DC Orders ED Discharge Orders    None       Isla Pence, MD 08/24/20 2337

## 2020-08-24 NOTE — ED Notes (Signed)
ED Provider at bedside. 

## 2020-08-25 MED ORDER — PREDNISONE 20 MG PO TABS: 60.0000 mg | ORAL_TABLET | Freq: Every day | ORAL | 0 refills | Status: DC

## 2020-08-25 NOTE — ED Provider Notes (Signed)
Care of the patient assumed at the change of shift. Here for SOB, COPD. Getting a continuous neb Physical Exam  BP 132/65   Pulse (!) 107   Temp 98.8 F (37.1 C) (Oral)   Resp (!) 29   Ht 6' (1.829 m)   Wt 90.7 kg   SpO2 99%   BMI 27.12 kg/m   Physical Exam No distress Resp even and unlabored, mild wheezing diffusely. No hypoxia on RA.  ED Course/Procedures     Procedures  MDM  Patient feels back to his baseline and would like to go home. Rx for Prednisone, continue nebs, PCP followup. RTED if worsening.        Truddie Hidden, MD 08/25/20 [phone]

## 2020-09-01 ENCOUNTER — Other Ambulatory Visit: Payer: Self-pay | Admitting: Family Medicine

## 2020-09-04 ENCOUNTER — Ambulatory Visit (INDEPENDENT_AMBULATORY_CARE_PROVIDER_SITE_OTHER): Payer: Medicare Other | Admitting: *Deleted

## 2020-09-04 ENCOUNTER — Other Ambulatory Visit: Payer: Self-pay | Admitting: Family Medicine

## 2020-09-04 ENCOUNTER — Ambulatory Visit (INDEPENDENT_AMBULATORY_CARE_PROVIDER_SITE_OTHER): Payer: Medicare Other | Admitting: Internal Medicine

## 2020-09-04 ENCOUNTER — Encounter: Payer: Self-pay | Admitting: Internal Medicine

## 2020-09-04 ENCOUNTER — Other Ambulatory Visit: Payer: Self-pay

## 2020-09-04 VITALS — BP 166/79 | HR 87 | Temp 97.6°F | Resp 22 | Ht 72.0 in | Wt 193.4 lb

## 2020-09-04 DIAGNOSIS — N184 Chronic kidney disease, stage 4 (severe): Secondary | ICD-10-CM

## 2020-09-04 DIAGNOSIS — Z5181 Encounter for therapeutic drug level monitoring: Secondary | ICD-10-CM | POA: Diagnosis not present

## 2020-09-04 DIAGNOSIS — K219 Gastro-esophageal reflux disease without esophagitis: Secondary | ICD-10-CM

## 2020-09-04 DIAGNOSIS — I480 Paroxysmal atrial fibrillation: Secondary | ICD-10-CM | POA: Diagnosis not present

## 2020-09-04 DIAGNOSIS — I4891 Unspecified atrial fibrillation: Secondary | ICD-10-CM | POA: Diagnosis not present

## 2020-09-04 DIAGNOSIS — I10 Essential (primary) hypertension: Secondary | ICD-10-CM

## 2020-09-04 DIAGNOSIS — J441 Chronic obstructive pulmonary disease with (acute) exacerbation: Secondary | ICD-10-CM

## 2020-09-04 DIAGNOSIS — I2510 Atherosclerotic heart disease of native coronary artery without angina pectoris: Secondary | ICD-10-CM | POA: Diagnosis not present

## 2020-09-04 DIAGNOSIS — Z7689 Persons encountering health services in other specified circumstances: Secondary | ICD-10-CM

## 2020-09-04 LAB — POCT INR: INR: 6.8 — AB (ref 2.0–3.0)

## 2020-09-04 MED ORDER — FLUTICASONE FUROATE-VILANTEROL 100-25 MCG/INH IN AEPB: 1.0000 | INHALATION_SPRAY | Freq: Every day | RESPIRATORY_TRACT | 11 refills | Status: DC

## 2020-09-04 MED ORDER — PREDNISONE 10 MG (21) PO TBPK: ORAL_TABLET | ORAL | 0 refills | Status: DC

## 2020-09-04 NOTE — Patient Instructions (Signed)
Please start taking Prednisone as prescribed.  Please start using Breo inhaler daily. Use Albuterol inhaler as rescue inhaler.  Please cut down -> quit smoking.  Please get fasting blood tests done before the next visit.

## 2020-09-04 NOTE — Patient Instructions (Addendum)
Hold warfarin x 3 days (Thurs,Fri,Sat) then take 1/2 tablet on Sunday and recheck on Mondays Recheck on Monday Pt denies bleeding Bleeding and fall precautions discussed with pt and he verbalized understanding.  Starting prednisone taper today for COPD

## 2020-09-05 NOTE — Assessment & Plan Note (Signed)
On Pantoprazole 

## 2020-09-05 NOTE — Assessment & Plan Note (Signed)
Wheezing currently Sterapred dosepak Added maintenance inhaler - Breo Albuterol PRN

## 2020-09-05 NOTE — Assessment & Plan Note (Addendum)
On Statin On Coumadin for A Fib On Metoprolol F/u with Cardiology

## 2020-09-05 NOTE — Assessment & Plan Note (Signed)
F/u with Nephrology Avoid nephrotoxic agents On Calcitriol On iron supplements On Lasix

## 2020-09-05 NOTE — Assessment & Plan Note (Signed)
BP Readings from Last 1 Encounters:  09/04/20 (!) 166/79   uncontrolled with Amlodipine, Metoprolol and Hydralazine COPD exacerbation might be contributing to elevated BP currently Counseled for compliance with the medications Advised DASH diet and moderate exercise/walking, at least 150 mins/week

## 2020-09-05 NOTE — Progress Notes (Signed)
New Patient Office Visit  Subjective:  Patient ID: [name] [name], male    DOB: [birth year]  Age: 70 y.o. MRN: [id number]  CC:  Chief Complaint  Patient presents with  . New Patient (Initial Visit)    New patient was seeing dr Buelah Manis has been sob for about 2 weeks was at [facility] er about a week ago for being sob given steroid shots     HPI [name] [name] is a 70 year old male with PMH of CAD s/p stent placement, AAA s/p repair, paroxysmal A Fib, CKD stage 4, HTN, COPD, GERD and HLD who presents for establishing care.  He has extensive cardiac history, for which he follows up with Cardiologist. He is on Coumadin for A Fib, and follows up with [facility] clinic regularly.  He has been having cough and mild dyspnea with wheezing for last 2 weeks. He has h/o COPD, for which he uses Albuterol PRN. Denies using any maintenance inhaler. He denies any fever, chills, nasal congestion or sore throat.  He follows up with Nephrologist for CKD stage 4.  He has had 2 doses of COVID vaccine.  Past Medical History:  Diagnosis Date  . AAA (abdominal aortic aneurysm) ([place])    a. s/p repair 06/2015 with left renal artery bypass at that time, post-op course c/b renal failure requiring dialysis, C dif.  . Anemia   . Arteriosclerotic cardiovascular disease (ASCVD)    a. AMI in 2000 treated at [facility]. b. cath in 12/2006->  Chronic total obstruction of the RCA;  drug-eluting stent placed in OM1, LVEF abnormal.  . Calculus of gallbladder with acute cholecystitis without obstruction   . Cerebrovascular disease 2002   carotid stent  . Chronic anticoagulation   . Chronic combined systolic and diastolic CHF (congestive heart failure) ([place])   . CKD (chronic kidney disease), stage IV ([place])   . COPD (chronic obstructive pulmonary disease) ([place])   . GERD (gastroesophageal reflux disease)   . Hyperlipidemia   . Hypertension   . Myocardial infarction ([place]) 10 yrs ago  . Nephrolithiasis   . Permanent atrial  fibrillation ([place])   . Pseudoaneurysm of aorta ([place]) 06/09/2015  . PVD (peripheral vascular disease) ([place])    Ct angiogram in 2009 revealed stable disease with 80% celiac stenosis,50% right renal artery ,ASCVD with ulceration in the abdominal aortashe  . Testicular carcinoma ([place]) 1990   right orchiectomy  . Tobacco abuse, in remission    20 pack years; quit in 2009    Past Surgical History:  Procedure Laterality Date  . ABDOMINAL AORTIC ANEURYSM REPAIR N/A 06/09/2015   Procedure: ANEURYSM ABDOMINAL AORTIC REPAIR;  Surgeon: Elam Dutch, MD;  Location: [facility] OR;  Service: Vascular;  Laterality: N/A;  . AORTIC ENDARTERECETOMY N/A 06/09/2015   Procedure: AORTIC ENDARTERECETOMY;  Surgeon: Elam Dutch, MD;  Location: [place];  Service: Vascular;  Laterality: N/A;  . AORTIC/RENAL BYPASS Left 06/09/2015   Procedure: LEFT RENAL Artery BYPASS;  Surgeon: Elam Dutch, MD;  Location: [place];  Service: Vascular;  Laterality: Left;  . APPENDECTOMY  2004  . CHOLECYSTECTOMY N/A 09/18/2018   Procedure: LAPAROSCOPIC CHOLECYSTECTOMY;  Surgeon: Aviva Signs, MD;  Location: [facility] OR[facility];  Service: General;  Laterality: N/A;  . COLONOSCOPY  06/17/2011   INCOMPLETE, PREP POOR. Procedure: COLONOSCOPY;  Surgeon: Daneil Dolin, MD;  Location[facility] END[facility];  Service: Endoscopy;  Laterality: N/A;  10:00  . COLONOSCOPY  07/15/2011   [id number] rectal and colon polyps  . COLONOSCOPY  N/A 05/22/2015   [id number] colonic and rectal polyps. tubular adenomas.repeat TCS 05/2018  . ESOPHAGEAL DILATION N/A 05/22/2015   Procedure: ESOPHAGEAL DILATION;  Surgeon: Daneil Dolin, MD;  [facility];  Service: Endoscopy;  Laterality: N/A;  . ESOPHAGOGASTRODUODENOSCOPY  06/17/2011   severe erosive/ulcerative reflux esophagitis, soft noncritical stricture dilatied, small hh, antral erosion   . ESOPHAGOGASTRODUODENOSCOPY N/A 05/22/2015   RMR: 2 cm HH otherwise normal. s/p empirical dilation.  . INSERTION OF DIALYSIS CATHETER  Left 06/26/2015   Procedure: INSERTION OF DIALYSIS CATHETER;  Surgeon: Angelia Mould, MD;  Location: [place];  Service: Vascular;  Laterality: Left;  . PERIPHERAL VASCULAR CATHETERIZATION N/A 05/28/2015   Procedure: Abdominal Aortogram;  Surgeon: Serafina Mitchell, MD;  Location: [place] CV LAB;  Service: Cardiovascular;  Laterality: N/A;  . testicular cancer  1990   right orchiectomy    Family History  Problem Relation Age of Onset  . Colon cancer Father 52       deceased  . Prostate cancer Father   . Heart disease Mother   . Liver disease Neg Hx   . Anesthesia problems Neg Hx   . Hypotension Neg Hx   . Malignant hyperthermia Neg Hx   . Pseudochol deficiency Neg Hx     Social History   Socioeconomic History  . Marital status: Married    Spouse name: Not on file  . Number of children: 2  . Years of education: Not on file  . Highest education level: Not on file  Occupational History  . Occupation: disabled    Fish farm manager: UNEMPLOYED  Tobacco Use  . Smoking status: Former Smoker    Packs/day: 0.50    Years: 40.00    Pack years: 20.00    Types: Cigarettes    Quit date: 03/02/2019    Years since quitting: 1.5  . Smokeless tobacco: Never Used  . Tobacco comment: 1/2 pack daily  Vaping Use  . Vaping Use: Never used  Substance and Sexual Activity  . Alcohol use: No    Alcohol/week: 0.0 standard drinks  . Drug use: No  . Sexual activity: Yes    Birth control/protection: None  Other Topics Concern  . Not on file  Social History Narrative  . Not on file   Social Determinants of Health   Financial Resource Strain: Not on file  Food Insecurity: Not on file  Transportation Needs: Not on file  Physical Activity: Not on file  Stress: Not on file  Social Connections: Not on file  Intimate Partner Violence: Not on file    ROS Review of Systems  Constitutional: Negative for chills and fever.  HENT: Negative for congestion and sore throat.   Eyes: Negative for  pain and discharge.  Respiratory: Positive for cough (chronic) and shortness of breath.   Cardiovascular: Negative for chest pain and palpitations.  Gastrointestinal: Negative for constipation, diarrhea, nausea and vomiting.  Endocrine: Negative for polydipsia and polyuria.  Genitourinary: Negative for dysuria and hematuria.  Musculoskeletal: Negative for neck pain and neck stiffness.  Skin: Negative for rash.  Neurological: Negative for dizziness, weakness, numbness and headaches.  Psychiatric/Behavioral: Negative for agitation and behavioral problems.    Objective:   Today's Vitals: BP (!) 166/79 (BP Location: Right Arm, Patient Position: Sitting, Cuff Size: Normal)   Pulse 87   Temp 97.6 F (36.4 C) (Oral)   Resp (!) 22   Ht 6' (1.829 m)   Wt 193 lb 6.4 oz (87.7 kg)  SpO2 97%   BMI 26.23 kg/m   Physical Exam Vitals reviewed.  Constitutional:      General: He is not in acute distress.    Appearance: He is not diaphoretic.  HENT:     Head: Normocephalic and atraumatic.     Nose: Nose normal.     Mouth/Throat:     Mouth: Mucous membranes are moist.  Eyes:     General: No scleral icterus.    Extraocular Movements: Extraocular movements intact.  Cardiovascular:     Rate and Rhythm: Normal rate and regular rhythm.     Pulses: Normal pulses.     Heart sounds: Normal heart sounds. No murmur heard.   Pulmonary:     Breath sounds: Wheezing (Diffuse b/l (mild)) present. No rales.  Musculoskeletal:     Cervical back: Neck supple. No tenderness.     Right lower leg: No edema.     Left lower leg: No edema.  Skin:    General: Skin is warm.     Findings: No rash.  Neurological:     General: No focal deficit present.     Mental Status: He is alert and oriented to person, place, and time.  Psychiatric:        Mood and Affect: Mood normal.        Behavior: Behavior normal.     Assessment & Plan:   Problem List Items Addressed This Visit      Encounter to establish  care    -  Primary Care established Previous chart reviewed History and medications reviewed with the patient   Relevant Orders  CBC with Differential/Platelet  CMP14+EGFR  Lipid panel  TSH  Hemoglobin A1c    Cardiovascular and Mediastinum   Essential hypertension (Chronic)    BP Readings from Last 1 Encounters:  09/04/20 (!) 166/79   uncontrolled with Amlodipine, Metoprolol and Hydralazine COPD exacerbation might be contributing to elevated BP currently Counseled for compliance with the medications Advised DASH diet and moderate exercise/walking, at least 150 mins/week       CAD - s/p PCI to Cx. 100% RCA CTO (Chronic)    On Statin On Coumadin for A Fib On Metoprolol F/u with Cardiology      Paroxysmal A-fib (HCC)    On Metoprolol for rate control On Coumadin Follows up with [facility] clinic        Respiratory   COPD with acute exacerbation ([place])    Wheezing currently Sterapred dosepak Added maintenance inhaler - Breo Albuterol PRN      Relevant Medications   fluticasone furoate-vilanterol (BREO ELLIPTA) 100-25 MCG/INH AEPB   predniSONE (STERAPRED UNI-PAK 21 TAB) 10 MG (21) TBPK tablet     Digestive   GERD (gastroesophageal reflux disease)    On Pantoprazole        Genitourinary   Chronic kidney disease    F/u with Nephrology Avoid nephrotoxic agents On Calcitriol On iron supplements On Lasix      Relevant Orders   CMP14+EGFR    Outpatient Encounter Medications as of 09/04/2020  Medication Sig  . albuterol (PROVENTIL) (2.5 MG/3ML) 0.083% nebulizer solution USE 1 VIAL IN NEBULIZER EVERY 4 HOURS AS NEEDED FOR WHEEZING OR SHORTNESS OF BREATH  . albuterol (VENTOLIN HFA) 108 (90 Base) MCG/ACT inhaler INHALE 2 TO 4 PUFFS BY MOUTH EVERY 4 HOURS AS NEEDED FOR WHEEZING OR SHORTNESS OF BREATH  . amLODipine (NORVASC) 10 MG tablet Take 1 tablet by mouth once daily  . atorvastatin (LIPITOR) 20 MG tablet  Take 1 tablet (20 mg total) by mouth daily.  . calcitRIOL  (ROCALTROL) 0.25 MCG capsule Take 0.25 mcg by mouth daily.   . Ferrous Sulfate (IRON) 325 (65 Fe) MG TABS TAKE 1 TABLET BY MOUTH THREE TIMES DAILY  . fluticasone (FLONASE) 50 MCG/ACT nasal spray Place 1 spray into both nostrils daily as needed for allergies or rhinitis.  . fluticasone furoate-vilanterol (BREO ELLIPTA) 100-25 MCG/INH AEPB Inhale 1 puff into the lungs daily.  . furosemide (LASIX) 40 MG tablet TAKE 1 TABLET ALTERNATING WITH ONE-HALF TABLET EVERY OTHER DAY  . hydrALAZINE (APRESOLINE) 50 MG tablet Take 1 tablet (50 mg total) by mouth 3 (three) times daily.  . metoprolol succinate (TOPROL-XL) 100 MG 24 hr tablet TAKE 1 & 1/2 (ONE & ONE-HALF) TABLETS BY MOUTH ONCE DAILY  . nitroGLYCERIN (NITROSTAT) 0.4 MG SL tablet place 1 tablet under the tongue if needed every 5 minutes for chest pain for 3 doses IF NO RELIEF AFTER 3RD DOSE CALL PRESCRIBER OR 911.  . pantoprazole (PROTONIX) 40 MG tablet Take 1 tablet by mouth once daily  . predniSONE (STERAPRED UNI-PAK 21 TAB) 10 MG (21) TBPK tablet Take as package instructions.  [name] warfarin (COUMADIN) 5 MG tablet TAKE 1 TABLET BY MOUTH ONCE DAILY EXCEPT 1.5 TABLETS ON TUESDAYS  . [DISCONTINUED] predniSONE (DELTASONE) 20 MG tablet Take 3 tablets (60 mg total) by mouth daily. (Patient not taking: Reported on 09/04/2020)  . [DISCONTINUED] tamsulosin (FLOMAX) 0.4 MG CAPS capsule TAKE 1 CAPSULE BY MOUTH ONCE DAILY AFTER  SUPPER. (Patient not taking: Reported on 09/04/2020)   No facility-administered encounter medications on file as of 09/04/2020.    Follow-up: Return in about 4 weeks (around 10/02/2020) for Annual physical.   Lindell Spar, MD

## 2020-09-05 NOTE — Assessment & Plan Note (Signed)
On Metoprolol for rate control ?On Coumadin ?Follows up with [facility] ?

## 2020-09-08 ENCOUNTER — Ambulatory Visit (INDEPENDENT_AMBULATORY_CARE_PROVIDER_SITE_OTHER): Payer: Medicare Other | Admitting: *Deleted

## 2020-09-08 DIAGNOSIS — Z5181 Encounter for therapeutic drug level monitoring: Secondary | ICD-10-CM

## 2020-09-08 DIAGNOSIS — I4891 Unspecified atrial fibrillation: Secondary | ICD-10-CM | POA: Diagnosis not present

## 2020-09-08 LAB — POCT INR: INR: 3.3 — AB (ref 2.0–3.0)

## 2020-09-08 NOTE — Patient Instructions (Signed)
Hold warfarin tonight then resume 1 tablet daily Finishes prednisone taper today for COPD Recheck in 2 weeks

## 2020-09-09 ENCOUNTER — Other Ambulatory Visit: Payer: Self-pay | Admitting: *Deleted

## 2020-09-09 ENCOUNTER — Telehealth: Payer: Self-pay

## 2020-09-09 ENCOUNTER — Other Ambulatory Visit: Payer: Self-pay

## 2020-09-09 MED ORDER — NITROGLYCERIN 0.4 MG SL SUBL: SUBLINGUAL_TABLET | SUBLINGUAL | 2 refills | Status: DC

## 2020-09-09 MED ORDER — TAMSULOSIN HCL 0.4 MG PO CAPS: ORAL_CAPSULE | ORAL | 1 refills | Status: DC

## 2020-09-09 NOTE — Telephone Encounter (Signed)
Patient advised.

## 2020-09-09 NOTE — Telephone Encounter (Signed)
Pt wife contacted our office for a prescription refill of Nitro for pt. Medication was approved and sent to [facility] in [place].

## 2020-09-09 NOTE — Telephone Encounter (Signed)
Patient needing a refill on nitrostat sent to [facility] in [place] ph# [phone]

## 2020-09-09 NOTE — Telephone Encounter (Signed)
This is sent in by cardiology please advise him to call the cardiologist for refill

## 2020-09-17 ENCOUNTER — Telehealth: Payer: Self-pay | Admitting: Cardiology

## 2020-09-17 MED ORDER — FUROSEMIDE 40 MG PO TABS: ORAL_TABLET | ORAL | 3 refills | Status: DC

## 2020-09-17 NOTE — Telephone Encounter (Signed)
New message      *STAT* If patient is at the pharmacy, call can be transferred to refill team.   1. Which medications need to be refilled? (please list name of each medication and dose if known) furosemide (LASIX) 40 MG tablet 2. Which pharmacy/location (including street and city if local pharmacy) is medication to be sent to? [facility] [place]  3. Do they need a 30 day or 90 day supply? [place]

## 2020-09-17 NOTE — Telephone Encounter (Signed)
Medication refill request approved for Furosemide 40 mg tablets and sent to [facility] per patient request.

## 2020-09-22 ENCOUNTER — Ambulatory Visit (INDEPENDENT_AMBULATORY_CARE_PROVIDER_SITE_OTHER): Payer: Medicare Other | Admitting: *Deleted

## 2020-09-22 DIAGNOSIS — Z5181 Encounter for therapeutic drug level monitoring: Secondary | ICD-10-CM

## 2020-09-22 DIAGNOSIS — I4891 Unspecified atrial fibrillation: Secondary | ICD-10-CM

## 2020-09-22 LAB — POCT INR: INR: 3.4 — AB (ref 2.0–3.0)

## 2020-09-22 NOTE — Patient Instructions (Signed)
Hold warfarin tonight then decrease dose to 1 tablet daily except 1/2 tablet on Sundays Recheck in 3 weeks

## 2020-09-25 ENCOUNTER — Institutional Professional Consult (permissible substitution): Payer: Medicare Other | Admitting: Pulmonary Disease

## 2020-09-30 ENCOUNTER — Other Ambulatory Visit: Payer: Self-pay | Admitting: Family Medicine

## 2020-10-02 ENCOUNTER — Ambulatory Visit (INDEPENDENT_AMBULATORY_CARE_PROVIDER_SITE_OTHER): Payer: Medicare Other | Admitting: Internal Medicine

## 2020-10-02 ENCOUNTER — Other Ambulatory Visit: Payer: Self-pay

## 2020-10-02 ENCOUNTER — Encounter: Payer: Self-pay | Admitting: Internal Medicine

## 2020-10-02 VITALS — BP 138/88 | HR 85 | Temp 98.3°F | Resp 16 | Ht 72.0 in | Wt 205.8 lb

## 2020-10-02 DIAGNOSIS — I10 Essential (primary) hypertension: Secondary | ICD-10-CM

## 2020-10-02 DIAGNOSIS — N184 Chronic kidney disease, stage 4 (severe): Secondary | ICD-10-CM

## 2020-10-02 DIAGNOSIS — I480 Paroxysmal atrial fibrillation: Secondary | ICD-10-CM

## 2020-10-02 DIAGNOSIS — Z23 Encounter for immunization: Secondary | ICD-10-CM | POA: Diagnosis not present

## 2020-10-02 DIAGNOSIS — Z0000 Encounter for general adult medical examination without abnormal findings: Secondary | ICD-10-CM | POA: Diagnosis not present

## 2020-10-02 DIAGNOSIS — Z0001 Encounter for general adult medical examination with abnormal findings: Secondary | ICD-10-CM | POA: Insufficient documentation

## 2020-10-02 DIAGNOSIS — J449 Chronic obstructive pulmonary disease, unspecified: Secondary | ICD-10-CM | POA: Diagnosis not present

## 2020-10-02 NOTE — Assessment & Plan Note (Signed)
BP Readings from Last 1 Encounters:  10/02/20 138/88   Well-controlled with Amlodipine, Metoprolol and Hydralazine Counseled for compliance with the medications Advised DASH diet and moderate exercise/walking as tolerated

## 2020-10-02 NOTE — Assessment & Plan Note (Signed)
Annual exam as documented. Counseling done  re healthy lifestyle involving commitment to 150 minutes exercise per week, heart healthy diet, and attaining healthy weight.The importance of adequate sleep also discussed. Changes in health habits are decided on by the patient with goals and time frames  set for achieving them. Immunization and cancer screening needs are specifically addressed at this visit. 

## 2020-10-02 NOTE — Assessment & Plan Note (Addendum)
Recently quit smoking Well-controlled with Breo now Albuterol PRN

## 2020-10-02 NOTE — Assessment & Plan Note (Signed)
F/u with Nephrology - Dr Justin Mend Avoid nephrotoxic agents On Calcitriol On iron supplements On Lasix

## 2020-10-02 NOTE — Patient Instructions (Signed)
Please continue taking medications as prescribed.  Please continue to follow low salt diet and ambulate as tolerated.  Please bring blood test reports from Nephrology clinic in the next visit.

## 2020-10-02 NOTE — Assessment & Plan Note (Signed)
On Metoprolol for rate control ?On Coumadin ?Follows up with [facility] ?

## 2020-10-02 NOTE — Progress Notes (Signed)
Established Patient Office Visit  Subjective:  Patient ID: [name] [name], male    DOB: 09/10/1950  Age: 70 y.o. MRN: [id number]  CC:  Chief Complaint  Patient presents with  . Annual Exam    Annual exam no complaints at this time     HPI [name] [name] is a 70 year old male with PMH of CAD s/p stent placement, AAA s/p repair, paroxysmal A Fib, CKD stage 4, HTN, COPD, GERD and HLD who presents for annual physical.  HTN: His BP was better today upon repeat check. BP is well-controlled. Takes medications regularly. Patient denies headache, dizziness, chest pain, dyspnea or palpitations.  COPD: He feels better now, but still has chronic cough. He has compeleted Sterapred and is now using Breo regularly. Currently denies dyspnea or wheezing.  Past Medical History:  Diagnosis Date  . AAA (abdominal aortic aneurysm) ([place])    a. s/p repair 06/2015 with left renal artery bypass at that time, post-op course c/b renal failure requiring dialysis, C dif.  . Anemia   . Arteriosclerotic cardiovascular disease (ASCVD)    a. AMI in 2000 treated at [facility]. b. cath in 12/2006->  Chronic total obstruction of the RCA;  drug-eluting stent placed in OM1, LVEF abnormal.  . Calculus of gallbladder with acute cholecystitis without obstruction   . Cerebrovascular disease 2002   carotid stent  . Chronic anticoagulation   . Chronic combined systolic and diastolic CHF (congestive heart failure) ([place])   . CKD (chronic kidney disease), stage IV ([place])   . COPD (chronic obstructive pulmonary disease) ([place])   . GERD (gastroesophageal reflux disease)   . Hyperlipidemia   . Hypertension   . Myocardial infarction ([place]) 10 yrs ago  . Nephrolithiasis   . Permanent atrial fibrillation ([place])   . Pseudoaneurysm of aorta ([place]) 06/09/2015  . PVD (peripheral vascular disease) ([place])    Ct angiogram in 2009 revealed stable disease with 80% celiac stenosis,50% right renal artery ,ASCVD with ulceration in the abdominal  aortashe  . Testicular carcinoma ([place]) 1990   right orchiectomy  . Tobacco abuse, in remission    20 pack years; quit in 2009    Past Surgical History:  Procedure Laterality Date  . ABDOMINAL AORTIC ANEURYSM REPAIR N/A 06/09/2015   Procedure: ANEURYSM ABDOMINAL AORTIC REPAIR;  Surgeon: Elam Dutch, MD;  Location: [facility] OR;  Service: Vascular;  Laterality: N/A;  . AORTIC ENDARTERECETOMY N/A 06/09/2015   Procedure: AORTIC ENDARTERECETOMY;  Surgeon: Elam Dutch, MD;  Location: [facility];  Service: Vascular;  Laterality: N/A;  . AORTIC/RENAL BYPASS Left 06/09/2015   Procedure: LEFT RENAL Artery BYPASS;  Surgeon: Elam Dutch, MD;  Location: [facility];  Service: Vascular;  Laterality: Left;  . APPENDECTOMY  2004  . CHOLECYSTECTOMY N/A 09/18/2018   Procedure: LAPAROSCOPIC CHOLECYSTECTOMY;  Surgeon: Aviva Signs, MD;  Location: [facility] ORS;  Service: General;  Laterality: N/A;  . COLONOSCOPY  06/17/2011   INCOMPLETE, PREP POOR. Procedure: COLONOSCOPY;  Surgeon: Daneil Dolin, MD;  Location: [facility] END[facility] SU[facility];  Service: Endoscopy;  Laterality: N/A;  10:00  . COLONOSCOPY  07/15/2011   YBW:LSLHTDSK rectal and colon polyps  . COLONOSCOPY N/A 05/22/2015   AJG:OTLXBWIO colonic and rectal polyps. tubular adenomas.repeat TCS 05/2018  . ESOPHAGEAL DILATION N/A 05/22/2015   Procedure: ESOPHAGEAL DILATION;  Surgeon: Daneil Dolin, MD;  [facility];  Service: Endoscopy;  Laterality: N/A;  . ESOPHAGOGASTRODUODENOSCOPY  06/17/2011   severe erosive/ulcerative reflux esophagitis, soft noncritical stricture dilatied, small hh,  antral erosion   . ESOPHAGOGASTRODUODENOSCOPY N/A 05/22/2015   RMR: 2 cm HH otherwise normal. s/p empirical dilation.  . INSERTION OF DIALYSIS CATHETER Left 06/26/2015   Procedure: INSERTION OF DIALYSIS CATHETER;  Surgeon: Angelia Mould, MD;  Location: [facility];  Service: Vascular;  Laterality: Left;  . PERIPHERAL VASCULAR CATHETERIZATION N/A 05/28/2015   Procedure: Abdominal Aortogram;   Surgeon: Serafina Mitchell, MD;  Location: [facility] LAB;  Service: Cardiovascular;  Laterality: N/A;  . testicular cancer  1990   right orchiectomy    Family History  Problem Relation Age of Onset  . Colon cancer Father 73       deceased  . Prostate cancer Father   . Heart disease Mother   . Liver disease Neg Hx   . Anesthesia problems Neg Hx   . Hypotension Neg Hx   . Malignant hyperthermia Neg Hx   . Pseudochol deficiency Neg Hx     Social History   Socioeconomic History  . Marital status: Married    Spouse name: Not on file  . Number of children: 2  . Years of education: Not on file  . Highest education level: Not on file  Occupational History  . Occupation: disabled    Fish farm manager: UNEMPLOYED  Tobacco Use  . Smoking status: Former Smoker    Packs/day: 0.50    Years: 40.00    Pack years: 20.00    Types: Cigarettes    Quit date: 03/02/2019    Years since quitting: 1.5  . Smokeless tobacco: Never Used  . Tobacco comment: 1/2 pack daily  Vaping Use  . Vaping Use: Never used  Substance and Sexual Activity  . Alcohol use: No    Alcohol/week: 0.0 standard drinks  . Drug use: No  . Sexual activity: Yes    Birth control/protection: None  Other Topics Concern  . Not on file  Social History Narrative  . Not on file   Social Determinants of Health   Financial Resource Strain: Not on file  Food Insecurity: Not on file  Transportation Needs: Not on file  Physical Activity: Not on file  Stress: Not on file  Social Connections: Not on file  Intimate Partner Violence: Not on file    Outpatient Medications Prior to Visit  Medication Sig Dispense Refill  . albuterol (PROVENTIL) (2.5 MG/3ML) 0.083% nebulizer solution USE 1 VIAL IN NEBULIZER EVERY 4 HOURS AS NEEDED FOR WHEEZING OR SHORTNESS OF BREATH 180 mL 0  . albuterol (VENTOLIN HFA) 108 (90 Base) MCG/ACT inhaler INHALE 2 TO 4 PUFFS BY MOUTH EVERY 4 HOURS AS NEEDED FOR WHEEZING OR SHORTNESS OF BREATH 9 g 0  .  amLODipine (NORVASC) 10 MG tablet Take 1 tablet by mouth once daily 90 tablet 2  . atorvastatin (LIPITOR) 20 MG tablet Take 1 tablet (20 mg total) by mouth daily. 30 tablet 3  . calcitRIOL (ROCALTROL) 0.25 MCG capsule Take 0.25 mcg by mouth daily.     . Ferrous Sulfate (IRON) 325 (65 Fe) MG TABS TAKE 1 TABLET BY MOUTH THREE TIMES DAILY 180 tablet 0  . fluticasone (FLONASE) 50 MCG/ACT nasal spray Place 1 spray into both nostrils daily as needed for allergies or rhinitis.    . fluticasone furoate-vilanterol (BREO ELLIPTA) 100-25 MCG/INH AEPB Inhale 1 puff into the lungs daily. 1 each 11  . furosemide (LASIX) 40 MG tablet TAKE 1 TABLET ALTERNATING WITH ONE-HALF TABLET EVERY OTHER DAY 45 tablet 3  . metoprolol succinate (TOPROL-XL) 100 MG 24 hr  tablet TAKE 1 & 1/2 (ONE & ONE-HALF) TABLETS BY MOUTH ONCE DAILY 135 tablet 2  . nitroGLYCERIN (NITROSTAT) 0.4 MG SL tablet place 1 tablet under the tongue if needed every 5 minutes for chest pain for 3 doses IF NO RELIEF AFTER 3RD DOSE CALL PRESCRIBER OR 911. 25 tablet 2  . pantoprazole (PROTONIX) 40 MG tablet Take 1 tablet by mouth once daily 90 tablet 0  . tamsulosin (FLOMAX) 0.4 MG CAPS capsule TAKE 1 CAPSULE BY MOUTH ONCE DAILY AFTER  SUPPER. 90 capsule 1  . warfarin (COUMADIN) 5 MG tablet TAKE 1 TABLET BY MOUTH ONCE DAILY EXCEPT 1.5 TABLETS ON TUESDAYS 100 tablet 3  . predniSONE (STERAPRED UNI-PAK 21 TAB) 10 MG (21) TBPK tablet Take as package instructions. 1 each 0  . hydrALAZINE (APRESOLINE) 50 MG tablet Take 1 tablet (50 mg total) by mouth 3 (three) times daily. 270 tablet 3   No facility-administered medications prior to visit.    No Known Allergies  ROS Review of Systems  Constitutional: Negative for chills and fever.  HENT: Negative for congestion and sore throat.   Eyes: Negative for pain and discharge.  Respiratory: Positive for cough (chronic). Negative for shortness of breath.   Cardiovascular: Negative for chest pain and palpitations.   Gastrointestinal: Negative for constipation, diarrhea, nausea and vomiting.  Endocrine: Negative for polydipsia and polyuria.  Genitourinary: Negative for dysuria and hematuria.  Musculoskeletal: Negative for neck pain and neck stiffness.  Skin: Negative for rash.  Neurological: Negative for dizziness, weakness, numbness and headaches.  Psychiatric/Behavioral: Negative for agitation and behavioral problems.      Objective:    Physical Exam Vitals reviewed.  Constitutional:      General: He is not in acute distress.    Appearance: He is not diaphoretic.  HENT:     Head: Normocephalic and atraumatic.     Nose: Nose normal.     Mouth/Throat:     Mouth: Mucous membranes are moist.  Eyes:     General: No scleral icterus.    Extraocular Movements: Extraocular movements intact.  Cardiovascular:     Rate and Rhythm: Normal rate and regular rhythm.     Pulses: Normal pulses.     Heart sounds: Normal heart sounds. No murmur heard.   Pulmonary:     Breath sounds: Normal breath sounds. No wheezing or rales.  Abdominal:     Palpations: Abdomen is soft.     Tenderness: There is no abdominal tenderness.  Musculoskeletal:     Cervical back: Neck supple. No tenderness.     Right lower leg: Edema (1+) present.     Left lower leg: Edema (1+) present.  Skin:    General: Skin is warm.     Findings: No rash.  Neurological:     General: No focal deficit present.     Mental Status: He is alert and oriented to person, place, and time.  Psychiatric:        Mood and Affect: Mood normal.        Behavior: Behavior normal.     BP 138/88 (BP Location: Left Arm, Cuff Size: Normal)   Pulse 85   Temp 98.3 F (36.8 C) (Oral)   Resp 16   Ht 6' (1.829 m)   Wt 205 lb 12.8 oz (93.4 kg)   SpO2 97%   BMI 27.91 kg/m  Wt Readings from Last 3 Encounters:  10/02/20 205 lb 12.8 oz (93.4 kg)  09/04/20 193 lb 6.4 oz (87.7 kg)  08/24/20 200 lb (90.7 kg)     Health Maintenance Due  Topic Date  Due  . Zoster Vaccines- Shingrix (1 of 2) Never done  . COVID-19 Vaccine (3 - Booster for Moderna series) 05/14/2020    There are no preventive care reminders to display for this patient.  Lab Results  Component Value Date   TSH 1.03 07/27/2017   Lab Results  Component Value Date   WBC 3.5 (L) 08/24/2020   HGB 13.4 08/24/2020   HCT 41.4 08/24/2020   MCV 92.8 08/24/2020   PLT 156 08/24/2020   Lab Results  Component Value Date   NA 138 08/24/2020   K 4.3 08/24/2020   CO2 21 (L) 08/24/2020   GLUCOSE 112 (H) 08/24/2020   BUN 29 (H) 08/24/2020   CREATININE 3.41 (H) 08/24/2020   BILITOT 0.3 01/22/2020   ALKPHOS 107 10/16/2019   AST 10 01/22/2020   ALT 8 (L) 01/22/2020   PROT 6.2 01/22/2020   ALBUMIN 3.7 10/16/2019   CALCIUM 9.5 08/24/2020   ANIONGAP 10 08/24/2020   Lab Results  Component Value Date   CHOL 145 01/22/2020   Lab Results  Component Value Date   HDL 30 (L) 01/22/2020   Lab Results  Component Value Date   LDLCALC 78 01/22/2020   Lab Results  Component Value Date   TRIG 303 (H) 01/22/2020   Lab Results  Component Value Date   CHOLHDL 4.8 01/22/2020   No results found for: HGBA1C    Assessment & Plan:   Problem List Items Addressed This Visit      Annual physical exam - Primary   Annual exam as documented. Counseling done  re healthy lifestyle involving commitment to 150 minutes exercise per week, heart healthy diet, and attaining healthy weight.The importance of adequate sleep also discussed. Changes in health habits are decided on by the patient with goals and time frames  set for achieving them. Immunization and cancer screening needs are specifically addressed at this visit.        Cardiovascular and Mediastinum   Essential hypertension (Chronic)    BP Readings from Last 1 Encounters:  10/02/20 138/88   Well-controlled with Amlodipine, Metoprolol and Hydralazine Counseled for compliance with the medications Advised DASH diet and  moderate exercise/walking as tolerated      Paroxysmal A-fib (HCC)    On Metoprolol for rate control On Coumadin Follows up with [facility]        Respiratory   COPD (chronic obstructive pulmonary disease) ([place])    Recently quit smoking Well-controlled with Breo now Albuterol PRN        Genitourinary   Chronic kidney disease    F/u with Nephrology - Dr Justin Mend Avoid nephrotoxic agents On Calcitriol On iron supplements On Lasix      First dose of Shingrix vaccine given in the office today.  No orders of the defined types were placed in this encounter.   Follow-up: Return in about 4 months (around 02/01/2021) for HTN and Shingrix - second dose.    Lindell Spar, MD

## 2020-10-08 ENCOUNTER — Other Ambulatory Visit: Payer: Self-pay | Admitting: *Deleted

## 2020-10-08 MED ORDER — PANTOPRAZOLE SODIUM 40 MG PO TBEC: 1.0000 | DELAYED_RELEASE_TABLET | Freq: Every day | ORAL | 0 refills | Status: DC

## 2020-10-08 MED ORDER — AMLODIPINE BESYLATE 10 MG PO TABS: 1.0000 | ORAL_TABLET | Freq: Every day | ORAL | 2 refills | Status: DC

## 2020-10-13 ENCOUNTER — Ambulatory Visit (INDEPENDENT_AMBULATORY_CARE_PROVIDER_SITE_OTHER): Payer: Medicare Other | Admitting: *Deleted

## 2020-10-13 DIAGNOSIS — I4891 Unspecified atrial fibrillation: Secondary | ICD-10-CM | POA: Diagnosis not present

## 2020-10-13 DIAGNOSIS — Z5181 Encounter for therapeutic drug level monitoring: Secondary | ICD-10-CM | POA: Diagnosis not present

## 2020-10-13 LAB — POCT INR: INR: 1.9 — AB (ref 2.0–3.0)

## 2020-10-13 NOTE — Patient Instructions (Signed)
Take warfarin 1 1/2 tablets today then resume 1 tablet daily except 1/2 tablet on Sundays Recheck in 4 weeks

## 2020-10-23 ENCOUNTER — Institutional Professional Consult (permissible substitution): Payer: Medicare Other | Admitting: Internal Medicine

## 2020-10-23 NOTE — Progress Notes (Deleted)
[name] [name], male    DOB: Feb 02, 1951,  MRN: [id number]   Brief patient profile:      History of Present Illness  10/23/2020  Pulmonary/ 1st office eval/ [name] [name] / [place] [facility]  No chief complaint on file.    Dyspnea:  *** Cough: *** Sleep: *** SABA use:   Past Medical History:  Diagnosis Date   AAA (abdominal aortic aneurysm) ([place])    a. s/p repair 06/2015 with left renal artery bypass at that time, post-op course c/b renal failure requiring dialysis, C dif.   Anemia    Arteriosclerotic cardiovascular disease (ASCVD)    a. AMI in 2000 treated at [facility]. b. cath in 12/2006->  Chronic total obstruction of the RCA;  drug-eluting stent placed in OM1, LVEF abnormal.   Calculus of gallbladder with acute cholecystitis without obstruction    Cerebrovascular disease 2002   carotid stent   Chronic anticoagulation    Chronic combined systolic and diastolic CHF (congestive heart failure) (H[facility])    CKD (chronic kidney disease), stage IV ([facility])    COPD (chronic obstructive pulmonary disease) (H[facility])    GERD (gastroesophageal reflux disease)    Hyperlipidemia    Hypertension    Myocardial infarction ([place]) 10 yrs ago   Nephrolithiasis    Permanent atrial fibrillation (H[facility])    Pseudoaneurysm of aorta ([place]) 06/09/2015   PVD (peripheral vascular disease) ([place])    Ct angiogram in 2009 revealed stable disease with 80% celiac stenosis,50% right renal artery ,ASCVD with ulceration in the abdominal aortashe   Testicular carcinoma ([place]) 1990   right orchiectomy   Tobacco abuse, in remission    20 pack years; quit in 2009    Outpatient Medications Prior to Visit  Medication Sig Dispense Refill   albuterol (PROVENTIL) (2.5 MG/3ML) 0.083% nebulizer solution USE 1 VIAL IN NEBULIZER EVERY 4 HOURS AS NEEDED FOR WHEEZING OR SHORTNESS OF BREATH 180 mL 0   albuterol (VENTOLIN HFA) 108 (90 Base) MCG/ACT inhaler INHALE 2 TO 4 PUFFS BY MOUTH EVERY 4 HOURS AS NEEDED FOR WHEEZING OR SHORTNESS OF BREATH 9  g 0   amLODipine (NORVASC) 10 MG tablet Take 1 tablet (10 mg total) by mouth daily. 90 tablet 2   atorvastatin (LIPITOR) 20 MG tablet Take 1 tablet (20 mg total) by mouth daily. 30 tablet 3   calcitRIOL (ROCALTROL) 0.25 MCG capsule Take 0.25 mcg by mouth daily.      Ferrous Sulfate (IRON) 325 (65 Fe) MG TABS TAKE 1 TABLET BY MOUTH THREE TIMES DAILY 180 tablet 0   fluticasone (FLONASE) 50 MCG/ACT nasal spray Place 1 spray into both nostrils daily as needed for allergies or rhinitis.     fluticasone furoate-vilanterol (BREO ELLIPTA) 100-25 MCG/INH AEPB Inhale 1 puff into the lungs daily. 1 each 11   furosemide (LASIX) 40 MG tablet TAKE 1 TABLET ALTERNATING WITH ONE-HALF TABLET EVERY OTHER DAY 45 tablet 3   hydrALAZINE (APRESOLINE) 50 MG tablet Take 1 tablet (50 mg total) by mouth 3 (three) times daily. 270 tablet 3   metoprolol succinate (TOPROL-XL) 100 MG 24 hr tablet TAKE 1 & 1/2 (ONE & ONE-HALF) TABLETS BY MOUTH ONCE DAILY 135 tablet 2   nitroGLYCERIN (NITROSTAT) 0.4 MG SL tablet place 1 tablet under the tongue if needed every 5 minutes for chest pain for 3 doses IF NO RELIEF AFTER 3RD DOSE CALL PRESCRIBER OR 911. 25 tablet 2   pantoprazole (PROTONIX) 40 MG tablet Take 1 tablet (40 mg total) by mouth daily. [place]  tablet 0   tamsulosin (FLOMAX) 0.4 MG CAPS capsule TAKE 1 CAPSULE BY MOUTH ONCE DAILY AFTER  SUPPER. 90 capsule 1   warfarin (COUMADIN) 5 MG tablet TAKE 1 TABLET BY MOUTH ONCE DAILY EXCEPT 1.5 TABLETS ON TUESDAYS 100 tablet 3   No facility-administered medications prior to visit.     Objective:     There were no vitals taken for this visit.         I personally reviewed images and agree with radiology impression as follows:  CXR:   08/24/20 Unchanged mild cardiomegaly with mild pulmonary vascular congestion. No focal consolidation or overt pulmonary edema. Assessment   No problem-specific Assessment & Plan notes found for this encounter.     Christinia Gully, MD 10/23/2020

## 2020-10-30 ENCOUNTER — Other Ambulatory Visit: Payer: Self-pay | Admitting: *Deleted

## 2020-10-30 MED ORDER — ALBUTEROL SULFATE (2.5 MG/3ML) 0.083% IN NEBU: INHALATION_SOLUTION | RESPIRATORY_TRACT | 0 refills | Status: DC

## 2020-11-07 ENCOUNTER — Other Ambulatory Visit: Payer: Self-pay

## 2020-11-07 ENCOUNTER — Ambulatory Visit (INDEPENDENT_AMBULATORY_CARE_PROVIDER_SITE_OTHER): Payer: Medicare Other | Admitting: *Deleted

## 2020-11-07 DIAGNOSIS — I4891 Unspecified atrial fibrillation: Secondary | ICD-10-CM

## 2020-11-07 DIAGNOSIS — Z5181 Encounter for therapeutic drug level monitoring: Secondary | ICD-10-CM

## 2020-11-07 LAB — POCT INR: INR: 2.6 (ref 2.0–3.0)

## 2020-11-07 NOTE — Patient Instructions (Signed)
Continue warfarin 1 tablet daily except 1/2 tablet on Sundays Recheck in 4 weeks

## 2020-12-08 ENCOUNTER — Ambulatory Visit (INDEPENDENT_AMBULATORY_CARE_PROVIDER_SITE_OTHER): Payer: Medicare Other | Admitting: *Deleted

## 2020-12-08 ENCOUNTER — Other Ambulatory Visit: Payer: Self-pay

## 2020-12-08 DIAGNOSIS — Z5181 Encounter for therapeutic drug level monitoring: Secondary | ICD-10-CM

## 2020-12-08 DIAGNOSIS — I4891 Unspecified atrial fibrillation: Secondary | ICD-10-CM

## 2020-12-08 LAB — POCT INR: INR: 2.3 (ref 2.0–3.0)

## 2020-12-08 NOTE — Patient Instructions (Signed)
Continue warfarin 1 tablet daily except 1/2 tablet on Sundays Recheck in 6 weeks 

## 2020-12-09 ENCOUNTER — Other Ambulatory Visit: Payer: Self-pay | Admitting: *Deleted

## 2020-12-09 MED ORDER — ATORVASTATIN CALCIUM 20 MG PO TABS: 20.0000 mg | ORAL_TABLET | Freq: Every day | ORAL | 3 refills | Status: DC

## 2020-12-17 ENCOUNTER — Telehealth: Payer: Self-pay | Admitting: Cardiology

## 2020-12-17 ENCOUNTER — Encounter: Payer: Self-pay | Admitting: Cardiology

## 2020-12-17 ENCOUNTER — Ambulatory Visit (INDEPENDENT_AMBULATORY_CARE_PROVIDER_SITE_OTHER): Payer: Medicare Other | Admitting: Cardiology

## 2020-12-17 ENCOUNTER — Other Ambulatory Visit: Payer: Self-pay

## 2020-12-17 VITALS — BP 150/60 | HR 75 | Ht 72.0 in | Wt 198.0 lb

## 2020-12-17 DIAGNOSIS — I2510 Atherosclerotic heart disease of native coronary artery without angina pectoris: Secondary | ICD-10-CM | POA: Diagnosis not present

## 2020-12-17 DIAGNOSIS — I10 Essential (primary) hypertension: Secondary | ICD-10-CM

## 2020-12-17 DIAGNOSIS — I4891 Unspecified atrial fibrillation: Secondary | ICD-10-CM | POA: Diagnosis not present

## 2020-12-17 DIAGNOSIS — E782 Mixed hyperlipidemia: Secondary | ICD-10-CM

## 2020-12-17 MED ORDER — HYDRALAZINE HCL 50 MG PO TABS: 75.0000 mg | ORAL_TABLET | Freq: Three times a day (TID) | ORAL | 3 refills | Status: DC

## 2020-12-17 NOTE — Patient Instructions (Addendum)
Medication Instructions:  Increase Hydralazine to 75mg  three times per day Continue all other medications.     Labwork: none  Testing/Procedures: none  Follow-Up: 6 months   Any Other Special Instructions Will Be Listed Below (If Applicable).    If you need a refill on your cardiac medications before your next appointment, please call your pharmacy.

## 2020-12-17 NOTE — Telephone Encounter (Signed)
Patient called in regards to his fluid pill. He has a question regarding the medication .

## 2020-12-17 NOTE — Telephone Encounter (Signed)
Pt just wanted name of medication- Furosemide and how it was supposed to be taken. Pt verbalized understanding of medication instructions.

## 2020-12-17 NOTE — Progress Notes (Signed)
Clinical Summary [name] [name] is a 70 y.o.maleseen today for follow up of the following medical problems.    1. Afib - eliquis was too expensive, changed to coumadin.    - no recent palpitaitons - compliant with med - no bleeding on coumadin.      2. CAD - remote cath at [facility]. History of RCA CTO, received stent to OM1.      - no chest pain. No SOB/DOE        3. HTN -  he is compliant with meds -10/2020 appt 138/88       4. PAD - s/p repair of juxtarenal abdominal aortic aneurysm, left renal artery bypass, followed by vascular - from 06/2017 note was to have f/u in 5 years     5. Chronic sysotlic HF/ICM - ehco 05/5398 LVEF 35-40% - Jan 2019 echo LVEF 50%. Abnormal diastolic function - lasix 40mg  daily led to [facility], has done well with 40mg  alternating days with 20mg      -no SOB/DOE. Occasional LE edema     6. HL - labs followed by pcp   01/2020 TC 145 HDL 30 TG 303 LDL 78     7. CKD IV - followed at [facility]    8. COPD - do not see formal PFTs in epic - has prn albuterol, started on breo     Past Medical History:  Diagnosis Date   AAA (abdominal aortic aneurysm) ([place])    a. s/p repair 06/2015 with left renal artery bypass at that time, post-op course c/b renal failure requiring dialysis, C dif.   Anemia    Arteriosclerotic cardiovascular disease (ASCVD)    a. AMI in 2000 treated at [facility]. b. cath in 12/2006->  Chronic total obstruction of the RCA;  drug-eluting stent placed in OM1, LVEF abnormal.   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   PVD (peripheral vascular disease) ([place])    Ct angiogram in 2009 revealed stable disease with 80% celiac stenosis,50% right renal artery ,ASCVD with ulceration in the abdominal aortashe   Testicular carcinoma ([place]) 1990   right orchiectomy   Tobacco abuse, in remission    20 pack years; quit in 2009     No Known Allergies   Current Outpatient Medications  Medication Sig Dispense Refill   albuterol (PROVENTIL) (2.5 MG/3ML) 0.083% nebulizer solution USE 1 VIAL IN NEBULIZER EVERY 4 HOURS AS NEEDED FOR WHEEZING OR SHORTNESS OF BREATH 180 mL 0   albuterol (VENTOLIN HFA) 108 (90 Base) MCG/ACT inhaler INHALE 2 TO 4 PUFFS BY MOUTH EVERY 4 HOURS AS NEEDED FOR WHEEZING OR SHORTNESS OF BREATH 9 g 0   amLODipine (NORVASC) 10 MG tablet Take 1 tablet (10 mg total) by mouth daily. 90 tablet 2   atorvastatin (LIPITOR) 20 MG tablet Take 1 tablet (20 mg total) by mouth daily. 30 tablet 3   calcitRIOL (ROCALTROL) 0.25 MCG capsule Take 0.25 mcg by mouth daily.  Ferrous Sulfate (IRON) 325 (65 Fe) MG TABS TAKE 1 TABLET BY MOUTH THREE TIMES DAILY 180 tablet 0   fluticasone (FLONASE) 50 MCG/ACT nasal spray Place 1 spray into both nostrils daily as needed for allergies or rhinitis.     fluticasone furoate-vilanterol (BREO ELLIPTA) 100-25 MCG/INH AEPB Inhale 1 puff into the lungs daily. 1 each 11   furosemide (LASIX) 40 MG tablet TAKE 1 TABLET ALTERNATING WITH ONE-HALF TABLET EVERY OTHER DAY 45 tablet 3   hydrALAZINE (APRESOLINE) 50 MG tablet Take 1 tablet (50 mg total) by mouth 3 (three) times daily. 270 tablet 3   metoprolol succinate (TOPROL-XL) 100 MG 24 hr tablet TAKE 1 & 1/2 (ONE & ONE-HALF) TABLETS BY MOUTH ONCE DAILY 135 tablet 2   nitroGLYCERIN (NITROSTAT) 0.4 MG SL tablet place 1 tablet under the tongue if needed every 5 minutes for chest pain for 3 doses IF NO RELIEF AFTER 3RD DOSE CALL PRESCRIBER OR  911. 25 tablet 2   pantoprazole (PROTONIX) 40 MG tablet Take 1 tablet (40 mg total) by mouth daily. 90 tablet 0   tamsulosin (FLOMAX) 0.4 MG CAPS capsule TAKE 1 CAPSULE BY MOUTH ONCE DAILY AFTER  SUPPER. 90 capsule 1   warfarin (COUMADIN) 5 MG tablet TAKE 1 TABLET BY MOUTH ONCE DAILY EXCEPT 1.5 TABLETS ON TUESDAYS 100 tablet 3   No current facility-administered medications for this visit.     Past Surgical History:  Procedure Laterality Date   ABDOMINAL AORTIC ANEURYSM REPAIR N/A 06/09/2015   Procedure: ANEURYSM ABDOMINAL AORTIC REPAIR;  Surgeon: Elam Dutch, MD;  Location: [place];  Service: Vascular;  Laterality: N/A;   AORTIC ENDARTERECETOMY N/A 06/09/2015   Procedure: AORTIC ENDARTERECETOMY;  Surgeon: Elam Dutch, MD;  Location: [place];  Service: Vascular;  Laterality: N/A;   AORTIC/RENAL BYPASS Left 06/09/2015   Procedure: LEFT RENAL Artery BYPASS;  Surgeon: Elam Dutch, MD;  Location: [place];  Service: Vascular;  Laterality: Left;   APPENDECTOMY  2004   CHOLECYSTECTOMY N/A 09/18/2018   Procedure: LAPAROSCOPIC CHOLECYSTECTOMY;  Surgeon: Aviva Signs, MD;  [facility]: [facility];  Service: General;  Laterality: N/A;   COLONOSCOPY  06/17/2011   INCOMPLETE, PREP POOR. Procedure: COLONOSCOPY;  Surgeon: Daneil Dolin, MD;  [facility];  Service: Endoscopy;  Laterality: N/A;  10:00   COLONOSCOPY  07/15/2011   SAY:[id number] rectal and colon polyps   COLONOSCOPY N/A 05/22/2015   NAT:[id number] colonic and rectal polyps. tubular adenomas.repeat TCS 05/2018   ESOPHAGEAL DILATION N/A 05/22/2015   Procedure: ESOPHAGEAL DILATION;  Surgeon: Daneil Dolin, MD;  [facility];  Service: Endoscopy;  Laterality: N/A;   ESOPHAGOGASTRODUODENOSCOPY  06/17/2011   severe erosive/ulcerative reflux esophagitis, soft noncritical stricture dilatied, small hh, antral erosion    ESOPHAGOGASTRODUODENOSCOPY N/A 05/22/2015   RMR: 2 cm HH otherwise normal. s/p empirical dilation.   INSERTION OF  DIALYSIS CATHETER Left 06/26/2015   Procedure: INSERTION OF DIALYSIS CATHETER;  Surgeon: Angelia Mould, MD;  Location: [place];  Service: Vascular;  Laterality: Left;   PERIPHERAL VASCULAR CATHETERIZATION N/A 05/28/2015   Procedure: Abdominal Aortogram;  Surgeon: Serafina Mitchell, MD;  Location: [place] [facility];  Service: Cardiovascular;  Laterality: N/A;   testicular cancer  1990   right orchiectomy     No Known Allergies    Family History  Problem Relation Age of Onset   Colon cancer Father 67       deceased   Prostate cancer Father  Heart disease Mother    Liver disease Neg Hx    Anesthesia problems Neg Hx    Hypotension Neg Hx    Malignant hyperthermia Neg Hx    Pseudochol deficiency Neg Hx      Social History [name] [name] reports that he quit smoking about 21 months ago. His smoking use included cigarettes. He has a 20.00 pack-year smoking history. He has never used smokeless tobacco. [name] [name] reports no history of alcohol use.   Review of Systems CONSTITUTIONAL: No weight loss, fever, chills, weakness or fatigue.  HEENT: Eyes: No visual loss, blurred vision, double vision or yellow sclerae.No hearing loss, sneezing, congestion, runny nose or sore throat.  SKIN: No rash or itching.  CARDIOVASCULAR: per hpi RESPIRATORY: No shortness of breath, cough or sputum.  GASTROINTESTINAL: No anorexia, nausea, vomiting or diarrhea. No abdominal pain or blood.  GENITOURINARY: No burning on urination, no polyuria NEUROLOGICAL: No headache, dizziness, syncope, paralysis, ataxia, numbness or tingling in the extremities. No change in bowel or bladder control.  MUSCULOSKELETAL: No muscle, back pain, joint pain or stiffness.  LYMPHATICS: No enlarged nodes. No history of splenectomy.  PSYCHIATRIC: No history of depression or anxiety.  ENDOCRINOLOGIC: No reports of sweating, cold or heat intolerance. No polyuria or polydipsia.  [name]   Physical Examination Today's Vitals    12/17/20 0958  BP: (!) 150/60  Pulse: 75  SpO2: 98%  Weight: 198 lb (89.8 kg)  Height: 6' (1.829 m)   Body mass index is 26.85 kg/m.  Gen: resting comfortably, no acute distress HEENT: no scleral icterus, pupils equal round and reactive, no palptable cervical adenopathy,  CV: RRR, no m/r/g no jvd Resp: Clear to auscultation bilaterally GI: abdomen is soft, non-tender, non-distended, normal bowel sounds, no hepatosplenomegaly MSK: extremities are warm, no edema.  Skin: warm, no rash Neuro:  no focal deficits Psych: appropriate affect   Diagnostic Studies  06/2015 echo - Left ventricle: The cavity size was normal. Systolic function was   moderately reduced. The estimated ejection fraction was in the   range of 35% to 40%. - Pulmonary arteries: PA peak pressure: 33 mm Hg (S). - Inferior vena cava: The vessel was dilated. The respirophasic   diameter changes were blunted (< 50%). This is a nonspecific   finding during positive pressure ventilation.   Impressions:   - Technically limited study due to poor echo windows and   tachycardia. Consider TEE if more accurate evaluation is   necessary.     Jan 2019 echo  Study Conclusions   - Left ventricle: The cavity size was normal. Wall thickness was   increased in a pattern of mild LVH. Systolic function was at the   lower limits of normal. The estimated ejection fraction was 50%.   Diffuse hypokinesis. The study was not technically sufficient to   allow evaluation of LV diastolic dysfunction due to atrial   fibrillation. Doppler parameters are consistent with high   ventricular filling pressure. - Aortic valve: Trileaflet; mildly thickened leaflets. - Mitral valve: There was mild regurgitation. - Left atrium: The atrium was moderately dilated. - Right atrium: The atrium was moderately dilated.     Assessment and Plan   1. Afib -  NOACs too expensive, continue coumadin - no recent symptoms, continue current meds    2. CAD - denies any symptoms, continue current meds   3. HTN -remains above goal, increase hydral to 75mg  tid      4. Hyperlipidemia -labs followed by pcp, continue  statin   5. COPD - breathing has improved since pcp added breo     [name] [name], M.D.

## 2020-12-31 ENCOUNTER — Other Ambulatory Visit: Payer: Self-pay | Admitting: Internal Medicine

## 2021-01-06 ENCOUNTER — Telehealth: Payer: Self-pay | Admitting: Internal Medicine

## 2021-01-06 NOTE — Telephone Encounter (Signed)
Left message for patient to call back and schedule Medicare Annual Wellness Visit (AWV) in office.   If unable to come into the office for AWV,  please offer to do virtually or by telephone.  Last AWV: 12/28/2016  Please schedule at anytime with [place].  40 minute appointment  Any questions, please contact me at [phone]

## 2021-01-15 ENCOUNTER — Other Ambulatory Visit: Payer: Self-pay | Admitting: *Deleted

## 2021-01-15 MED ORDER — ALBUTEROL SULFATE (2.5 MG/3ML) 0.083% IN NEBU: INHALATION_SOLUTION | RESPIRATORY_TRACT | 0 refills | Status: DC

## 2021-01-19 ENCOUNTER — Other Ambulatory Visit: Payer: Self-pay

## 2021-01-19 ENCOUNTER — Ambulatory Visit (INDEPENDENT_AMBULATORY_CARE_PROVIDER_SITE_OTHER): Payer: Medicare Other | Admitting: *Deleted

## 2021-01-19 DIAGNOSIS — Z5181 Encounter for therapeutic drug level monitoring: Secondary | ICD-10-CM | POA: Diagnosis not present

## 2021-01-19 DIAGNOSIS — I4891 Unspecified atrial fibrillation: Secondary | ICD-10-CM | POA: Diagnosis not present

## 2021-01-19 LAB — POCT INR: INR: 2.9 (ref 2.0–3.0)

## 2021-01-19 NOTE — Patient Instructions (Signed)
Continue warfarin 1 tablet daily except 1/2 tablet on Sundays Recheck in 6 weeks 

## 2021-02-02 ENCOUNTER — Ambulatory Visit: Payer: Medicare Other | Admitting: Internal Medicine

## 2021-02-03 ENCOUNTER — Encounter: Payer: Self-pay | Admitting: Internal Medicine

## 2021-02-03 ENCOUNTER — Other Ambulatory Visit: Payer: Self-pay

## 2021-02-03 ENCOUNTER — Ambulatory Visit (INDEPENDENT_AMBULATORY_CARE_PROVIDER_SITE_OTHER): Payer: Medicare Other | Admitting: Internal Medicine

## 2021-02-03 ENCOUNTER — Telehealth: Payer: Self-pay | Admitting: Internal Medicine

## 2021-02-03 DIAGNOSIS — J209 Acute bronchitis, unspecified: Secondary | ICD-10-CM

## 2021-02-03 DIAGNOSIS — J440 Chronic obstructive pulmonary disease with acute lower respiratory infection: Secondary | ICD-10-CM | POA: Diagnosis not present

## 2021-02-03 MED ORDER — BENZONATATE 100 MG PO CAPS: 100.0000 mg | ORAL_CAPSULE | Freq: Two times a day (BID) | ORAL | 0 refills | Status: DC | PRN

## 2021-02-03 MED ORDER — AZITHROMYCIN 250 MG PO TABS: ORAL_TABLET | ORAL | 0 refills | Status: DC

## 2021-02-03 MED ORDER — AMOXICILLIN-POT CLAVULANATE 875-125 MG PO TABS: 1.0000 | ORAL_TABLET | Freq: Two times a day (BID) | ORAL | 0 refills | Status: DC

## 2021-02-03 NOTE — Progress Notes (Addendum)
Virtual Visit via Telephone Note   This visit type was conducted due to national recommendations for restrictions regarding the COVID-19 Pandemic (e.g. social distancing) in an effort to limit this patient's exposure and mitigate transmission in our community.  Due to his co-morbid illnesses, this patient is at least at moderate risk for complications without adequate follow up.  This format is felt to be most appropriate for this patient at this time.  The patient did not have access to video technology/had technical difficulties with video requiring transitioning to audio format only (telephone).  All issues noted in this document were discussed and addressed.  No physical exam could be performed with this format.   Evaluation Performed:  Follow-up visit  Date:  02/03/2021   ID:  JERAMIE SCOGIN, DOB 10-18-1950, MRN [id number]  Patient Location: Home Provider Location: Office/Clinic  Participants: Patient Location of Patient: Home Location of Provider: Telehealth Consent was obtain for visit to be over via telehealth. I verified that I am speaking with the correct person using two identifiers.  PCP:  Lindell Spar, MD   Chief Complaint:  Cough and fever  History of Present Illness:    Doron Shake Lang is a 70 y.o. male who has a televisit for c/o cough with increased expectoration for last 1 week.  He also had fever initially, but denies any fever or chills now.  He has been using Breo for his COPD.  He denies any dyspnea or wheezing currently.  The patient does have symptoms concerning for COVID-19 infection (fever, chills, cough, or new shortness of breath).   Past Medical, Surgical, Social History, Allergies, and Medications have been Reviewed.  Past Medical History:  Diagnosis Date   AAA (abdominal aortic aneurysm)    a. s/p repair 06/2015 with left renal artery bypass at that time, post-op course c/b renal failure requiring dialysis, C dif.   Anemia    Arteriosclerotic  cardiovascular disease (ASCVD)    a. AMI in 2000 treated at [facility]. b. cath in 12/2006->  Chronic total obstruction of the RCA;  drug-eluting stent placed in OM1, LVEF abnormal.   Calculus of gallbladder with acute cholecystitis without obstruction    Cerebrovascular disease 2002   carotid stent   Chronic anticoagulation    Chronic combined systolic and diastolic CHF (congestive heart failure) (HCC)    CKD (chronic kidney disease), stage IV (HCC)    COPD (chronic obstructive pulmonary disease) (HCC)    GERD (gastroesophageal reflux disease)    Hyperlipidemia    Hypertension    Myocardial infarction ([place]) 10 yrs ago   Nephrolithiasis    Permanent atrial fibrillation (HCC)    Pseudoaneurysm of aorta ([place]) 06/09/2015   PVD (peripheral vascular disease) ([place])    Ct angiogram in 2009 revealed stable disease with 80% celiac stenosis,50% right renal artery ,ASCVD with ulceration in the abdominal aortashe   Testicular carcinoma ([place]) 1990   right orchiectomy   Tobacco abuse, in remission    20 pack years; quit in 2009   Past Surgical History:  Procedure Laterality Date   ABDOMINAL AORTIC ANEURYSM REPAIR N/A 06/09/2015   Procedure: ANEURYSM ABDOMINAL AORTIC REPAIR;  Surgeon: Elam Dutch, MD;  Location: [facility];  Service: Vascular;  Laterality: N/A;   AORTIC ENDARTERECETOMY N/A 06/09/2015   Procedure: AORTIC ENDARTERECETOMY;  Surgeon: Elam Dutch, MD;  Location: [place];  Service: Vascular;  Laterality: N/A;   AORTIC/RENAL BYPASS Left 06/09/2015   Procedure: LEFT RENAL Artery BYPASS;  Surgeon: Elam Dutch, MD;  Location: [place];  Service: Vascular;  Laterality: Left;   APPENDECTOMY  2004   CHOLECYSTECTOMY N/A 09/18/2018   Procedure: LAPAROSCOPIC CHOLECYSTECTOMY;  Surgeon: Aviva Signs, MD;  [facility];  Service: General;  Laterality: N/A;   COLONOSCOPY  06/17/2011   INCOMPLETE, PREP POOR. Procedure: COLONOSCOPY;  Surgeon: Daneil Dolin, MD;  [facility];  Service:  Endoscopy;  Laterality: N/A;  10:00   COLONOSCOPY  07/15/2011   TTS:[id number] rectal and colon polyps   COLONOSCOPY N/A 05/22/2015   ESP:[id number] colonic and rectal polyps. tubular adenomas.repeat TCS 05/2018   ESOPHAGEAL DILATION N/A 05/22/2015   Procedure: ESOPHAGEAL DILATION;  Surgeon: Daneil Dolin, MD;  [facility];  Service: Endoscopy;  Laterality: N/A;   ESOPHAGOGASTRODUODENOSCOPY  06/17/2011   severe erosive/ulcerative reflux esophagitis, soft noncritical stricture dilatied, small hh, antral erosion    ESOPHAGOGASTRODUODENOSCOPY N/A 05/22/2015   RMR: 2 cm HH otherwise normal. s/p empirical dilation.   INSERTION OF DIALYSIS CATHETER Left 06/26/2015   Procedure: INSERTION OF DIALYSIS CATHETER;  Surgeon: Angelia Mould, MD;  Location: [place];  Service: Vascular;  Laterality: Left;   PERIPHERAL VASCULAR CATHETERIZATION N/A 05/28/2015   Procedure: Abdominal Aortogram;  Surgeon: Serafina Mitchell, MD;  Location: [place] [facility];  Service: Cardiovascular;  Laterality: N/A;   testicular cancer  1990   right orchiectomy     Current Meds  Medication Sig   albuterol (PROVENTIL) (2.5 MG/3ML) 0.083% nebulizer solution USE 1 VIAL IN NEBULIZER EVERY 4 HOURS AS NEEDED FOR WHEEZING OR SHORTNESS OF BREATH   albuterol (VENTOLIN HFA) 108 (90 Base) MCG/ACT inhaler INHALE 2 TO 4 PUFFS BY MOUTH EVERY 4 HOURS AS NEEDED FOR WHEEZING OR SHORTNESS OF BREATH   amLODipine (NORVASC) 10 MG tablet Take 1 tablet (10 mg total) by mouth daily.   atorvastatin (LIPITOR) 20 MG tablet Take 1 tablet (20 mg total) by mouth daily.   azithromycin (ZITHROMAX) 250 MG tablet Take 2 tablets on day 1, then 1 tablet daily on days 2 through 5   benzonatate (TESSALON) 100 MG capsule Take 1 capsule (100 mg total) by mouth 2 (two) times daily as needed for cough.   calcitRIOL (ROCALTROL) 0.25 MCG capsule Take 0.25 mcg by mouth daily.    Ferrous Sulfate (IRON) 325 (65 Fe) MG TABS TAKE 1 TABLET BY MOUTH THREE TIMES DAILY    fluticasone (FLONASE) 50 MCG/ACT nasal spray Place 1 spray into both nostrils daily as needed for allergies or rhinitis.   fluticasone furoate-vilanterol (BREO ELLIPTA) 100-25 MCG/INH AEPB Inhale 1 puff into the lungs daily.   furosemide (LASIX) 40 MG tablet TAKE 1 TABLET ALTERNATING WITH ONE-HALF TABLET EVERY OTHER DAY   hydrALAZINE (APRESOLINE) 50 MG tablet Take 1.5 tablets (75 mg total) by mouth 3 (three) times daily.   metoprolol succinate (TOPROL-XL) 100 MG 24 hr tablet TAKE 1 & 1/2 (ONE & ONE-HALF) TABLETS BY MOUTH ONCE DAILY   nitroGLYCERIN (NITROSTAT) 0.4 MG SL tablet place 1 tablet under the tongue if needed every 5 minutes for chest pain for 3 doses IF NO RELIEF AFTER 3RD DOSE CALL PRESCRIBER OR 911.   pantoprazole (PROTONIX) 40 MG tablet Take 1 tablet by mouth once daily   tamsulosin (FLOMAX) 0.4 MG CAPS capsule TAKE 1 CAPSULE BY MOUTH ONCE DAILY AFTER  SUPPER.   warfarin (COUMADIN) 5 MG tablet TAKE 1 TABLET BY MOUTH ONCE DAILY EXCEPT 1.5 TABLETS ON TUESDAYS     Allergies:  Patient has no known allergies.   ROS:   Please see the history of present illness.     All other systems reviewed and are negative.   Labs/Other Tests and Data Reviewed:    Recent Labs: 08/24/2020: B Natriuretic Peptide 200.0; BUN 29; Creatinine, Ser 3.41; Hemoglobin 13.4; Platelets 156; Potassium 4.3; Sodium 138   Recent Lipid Panel Lab Results  Component Value Date/Time   CHOL 145 01/22/2020 03:01 PM   TRIG 303 (H) 01/22/2020 03:01 PM   HDL 30 (L) 01/22/2020 03:01 PM   CHOLHDL 4.8 01/22/2020 03:01 PM   LDLCALC 78 01/22/2020 03:01 PM    Wt Readings from Last 3 Encounters:  12/17/20 198 lb (89.8 kg)  10/02/20 205 lb 12.8 oz (93.4 kg)  09/04/20 193 lb 6.4 oz (87.7 kg)     ASSESSMENT & PLAN:    Acute bronchitis with COPD Since symptoms have started about a week ago, would avoid testing for COVID at this time, as he is outside of treatment window anyways Started Augmentin for acute  bronchitis/COPD exacerbation Continue Breo Continue albuterol as needed Tessalon as needed for cough  Time:   Today, I have spent 13 minutes reviewing the chart, including problem list, medications, and with the patient with telehealth technology discussing the above problems.   Medication Adjustments/Labs and Tests Ordered: Current medicines are reviewed at length with the patient today.  Concerns regarding medicines are outlined above.   Tests Ordered: No orders of the defined types were placed in this encounter.   Medication Changes: Meds ordered this encounter  Medications   azithromycin (ZITHROMAX) 250 MG tablet    Sig: Take 2 tablets on day 1, then 1 tablet daily on days 2 through 5    Dispense:  6 tablet    Refill:  0   benzonatate (TESSALON) 100 MG capsule    Sig: Take 1 capsule (100 mg total) by mouth 2 (two) times daily as needed for cough.    Dispense:  20 capsule    Refill:  0     Note: This dictation was prepared with Dragon dictation along with smaller phrase technology. Similar sounding words can be transcribed inadequately or may not be corrected upon review. Any transcriptional errors that result from this process are unintentional.      Disposition:  Follow up  Signed, Lindell Spar, MD  02/03/2021 9:20 AM     [name] [name]

## 2021-02-03 NOTE — Addendum Note (Signed)
Addended byIhor Dow on: 02/03/2021 12:12 PM   Modules accepted: Orders

## 2021-02-03 NOTE — Telephone Encounter (Signed)
[facility] pharmacy notified

## 2021-02-03 NOTE — Telephone Encounter (Signed)
The zithomycin will interfere with the Coumadin--please call pharmacy back

## 2021-02-16 ENCOUNTER — Ambulatory Visit (INDEPENDENT_AMBULATORY_CARE_PROVIDER_SITE_OTHER): Payer: Medicare Other

## 2021-02-16 ENCOUNTER — Other Ambulatory Visit: Payer: Self-pay

## 2021-02-16 VITALS — Ht 72.0 in | Wt 198.0 lb

## 2021-02-16 DIAGNOSIS — Z0000 Encounter for general adult medical examination without abnormal findings: Secondary | ICD-10-CM

## 2021-02-16 NOTE — Progress Notes (Signed)
Subjective:   [name] [name] is a 70 y.o. male who presents for Medicare Annual/Subsequent preventive examination. Virtual Visit via Telephone Note  I connected with  [name] [name] on 02/16/21 at  3:40 PM EDT by telephone and verified that I am speaking with the correct person using two identifiers.  Location: Patient: home  Provider: [facility]pc Persons participating in the virtual visit: patient/Nurse Health Advisor   I discussed the limitations, risks, security and privacy concerns of performing an evaluation and management service by telephone and the availability of in person appointments. The patient expressed understanding and agreed to proceed.  Interactive audio and video telecommunications were attempted between this nurse and patient, however failed, due to patient having technical difficulties OR patient did not have access to video capability.  We continued and completed visit with audio only.  Some vital signs may be absent or patient reported.   Chriss Driver, LPN  Review of Systems     Cardiac Risk Factors include: advanced age (>74men, >106 women);hypertension;male gender;dyslipidemia;sedentary lifestyle;smoking/ tobacco exposure     Objective:    Today's Vitals   02/16/21 1534  Weight: 198 lb (89.8 kg)  Height: 6' (1.829 m)  PainSc: 0-No pain   Body mass index is 26.85 kg/m.  Advanced Directives 02/16/2021 08/24/2020 10/16/2019 08/20/2019 08/19/2019 09/12/2018 07/25/2015  Does Patient Have a Medical Advance Directive? No No No No No No No  Type of Advance Directive - - - - - - -  Does patient want to make changes to medical advance directive? - - - - - - -  Copy of [place] in Chart? - - - - - - -  Would patient like information on creating a medical advance directive? No - Patient declined - No - Patient declined No - Patient declined - No - Patient declined -  Pre-existing out of facility DNR order (yellow form or pink MOST form) - - - - -  - -    Current Medications (verified) Outpatient Encounter Medications as of 02/16/2021  Medication Sig   albuterol (PROVENTIL) (2.5 MG/3ML) 0.083% nebulizer solution USE 1 VIAL IN NEBULIZER EVERY 4 HOURS AS NEEDED FOR WHEEZING OR SHORTNESS OF BREATH   albuterol (VENTOLIN HFA) 108 (90 Base) MCG/ACT inhaler INHALE 2 TO 4 PUFFS BY MOUTH EVERY 4 HOURS AS NEEDED FOR WHEEZING OR SHORTNESS OF BREATH   amLODipine (NORVASC) 10 MG tablet Take 1 tablet (10 mg total) by mouth daily.   amoxicillin-clavulanate (AUGMENTIN) 875-125 MG tablet Take 1 tablet by mouth 2 (two) times daily.   atorvastatin (LIPITOR) 20 MG tablet Take 1 tablet (20 mg total) by mouth daily.   benzonatate (TESSALON) 100 MG capsule Take 1 capsule (100 mg total) by mouth 2 (two) times daily as needed for cough.   calcitRIOL (ROCALTROL) 0.25 MCG capsule Take 0.25 mcg by mouth daily.    Ferrous Sulfate (IRON) 325 (65 Fe) MG TABS TAKE 1 TABLET BY MOUTH THREE TIMES DAILY   fluticasone (FLONASE) 50 MCG/ACT nasal spray Place 1 spray into both nostrils daily as needed for allergies or rhinitis.   fluticasone furoate-vilanterol (BREO ELLIPTA) 100-25 MCG/INH AEPB Inhale 1 puff into the lungs daily.   furosemide (LASIX) 40 MG tablet TAKE 1 TABLET ALTERNATING WITH ONE-HALF TABLET EVERY OTHER DAY   hydrALAZINE (APRESOLINE) 50 MG tablet Take 1.5 tablets (75 mg total) by mouth 3 (three) times daily.   metoprolol succinate (TOPROL-XL) 100 MG 24 hr tablet TAKE 1 & 1/2 (ONE &  ONE-HALF) TABLETS BY MOUTH ONCE DAILY   nitroGLYCERIN (NITROSTAT) 0.4 MG SL tablet place 1 tablet under the tongue if needed every 5 minutes for chest pain for 3 doses IF NO RELIEF AFTER 3RD DOSE CALL PRESCRIBER OR 911.   pantoprazole (PROTONIX) 40 MG tablet Take 1 tablet by mouth once daily   tamsulosin (FLOMAX) 0.4 MG CAPS capsule TAKE 1 CAPSULE BY MOUTH ONCE DAILY AFTER  SUPPER.   warfarin (COUMADIN) 5 MG tablet TAKE 1 TABLET BY MOUTH ONCE DAILY EXCEPT 1.5 TABLETS ON TUESDAYS    No facility-administered encounter medications on file as of 02/16/2021.    Allergies (verified) Patient has no known allergies.   History: Past Medical History:  Diagnosis Date   AAA (abdominal aortic aneurysm)    a. s/p repair 06/2015 with left renal artery bypass at that time, post-op course c/b renal failure requiring dialysis, C dif.   Anemia    Arteriosclerotic cardiovascular disease (ASCVD)    a. AMI in 2000 [facility]. b. cath in 12/2006->  Chronic total obstruction of the RCA;  drug-eluting stent placed in OM1, LVEF abnormal.   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   PVD (peripheral vascular disease) ([place])    Ct angiogram in 2009 revealed stable disease with 80% celiac stenosis,50% right renal artery ,ASCVD with ulceration in the abdominal aortashe   Testicular carcinoma ([place]) 1990   right orchiectomy   Tobacco abuse, in remission    20 pack years; quit in 2009   Past Surgical History:  Procedure Laterality Date   ABDOMINAL AORTIC ANEURYSM REPAIR N/A 06/09/2015   Procedure: ANEURYSM ABDOMINAL AORTIC REPAIR;  Surgeon: Elam Dutch, MD;  Location: [place];  Service: Vascular;  Laterality: N/A;   AORTIC ENDARTERECETOMY N/A 06/09/2015   Procedure: AORTIC ENDARTERECETOMY;  Surgeon: Elam Dutch, MD;  Location: [place];  Service: Vascular;  Laterality: N/A;   AORTIC/RENAL BYPASS Left 06/09/2015   Procedure: LEFT RENAL Artery BYPASS;  Surgeon: Elam Dutch, MD;   Location: [place];  Service: Vascular;  Laterality: Left;   APPENDECTOMY  2004   CHOLECYSTECTOMY N/A 09/18/2018   Procedure: LAPAROSCOPIC CHOLECYSTECTOMY;  Surgeon: Aviva Signs, MD;  Location: [facility];  Service: General;  Laterality: N/A;   COLONOSCOPY  06/17/2011   INCOMPLETE, PREP POOR. Procedure: COLONOSCOPY;  Surgeon: Daneil Dolin, MD;  [facility];  Service: Endoscopy;  Laterality: N/A;  10:00   COLONOSCOPY  07/15/2011   MOQ:[id number] rectal and colon polyps   COLONOSCOPY N/A 05/22/2015   KPT:[id number] colonic and rectal polyps. tubular adenomas.repeat TCS 05/2018   ESOPHAGEAL DILATION N/A 05/22/2015   Procedure: ESOPHAGEAL DILATION;  Surgeon: Daneil Dolin, MD;  [facility];  Service: Endoscopy;  Laterality: N/A;   ESOPHAGOGASTRODUODENOSCOPY  06/17/2011   severe erosive/ulcerative reflux esophagitis, soft noncritical stricture dilatied, small hh, antral erosion    ESOPHAGOGASTRODUODENOSCOPY N/A 05/22/2015   RMR: 2 cm HH otherwise normal. s/p empirical dilation.   INSERTION OF  DIALYSIS CATHETER Left 06/26/2015   Procedure: INSERTION OF DIALYSIS CATHETER;  Surgeon: Angelia Mould, MD;  Location: [place];  Service: Vascular;  Laterality: Left;   PERIPHERAL VASCULAR CATHETERIZATION N/A 05/28/2015   Procedure: Abdominal Aortogram;  Surgeon: Serafina Mitchell, MD;  Location: [place] [facility];  Service: Cardiovascular;  Laterality: N/A;   testicular cancer  1990   right orchiectomy   Family History  Problem Relation Age of Onset   Colon cancer Father 64       deceased   Prostate cancer Father    Heart disease Mother    Liver disease Neg Hx    Anesthesia problems Neg Hx    Hypotension Neg Hx    Malignant hyperthermia Neg Hx    Pseudochol deficiency Neg Hx    Social History   Socioeconomic History   Marital status: Married    Spouse name: Not on file   Number of children: 2   Years of education: Not on file   Highest education level: Not on file   Occupational History   Occupation: disabled    Employer: UNEMPLOYED  Tobacco Use   Smoking status: Former    Packs/day: 0.50    Years: 40.00    Pack years: 20.00    Types: Cigarettes    Quit date: 03/02/2019    Years since quitting: 1.9   Smokeless tobacco: Never   Tobacco comments:    1/2 pack daily  Vaping Use   Vaping Use: Never used  Substance and Sexual Activity   Alcohol use: No    Alcohol/week: 0.0 standard drinks   Drug use: No   Sexual activity: Yes    Birth control/protection: None  Other Topics Concern   Not on file  Social History Narrative   Not on file   Social Determinants of Health   Financial Resource Strain: Low Risk    Difficulty of Paying Living Expenses: Not hard at all  Food Insecurity: No Food Insecurity   Worried About Charity fundraiser in the Last Year: Never true   Ran Out of Food in the Last Year: Never true  Transportation Needs: No Transportation Needs   Lack of Transportation (Medical): No   Lack of Transportation (Non-Medical): No  Physical Activity: Insufficiently Active   Days of Exercise per Week: 3 days   Minutes of Exercise per Session: 30 min  Stress: No Stress Concern Present   Feeling of Stress : Not at all  Social Connections: Socially Integrated   Frequency of Communication with Friends and Family: More than three times a week   Frequency of Social Gatherings with Friends and Family: More than three times a week   Attends Religious Services: More than 4 times per year   Active Member of [employer] or Organizations: Yes   Attends Music therapist: More than 4 times per year   Marital Status: Married    Tobacco Counseling Counseling given: Not Answered Tobacco comments: 1/2 pack daily   Clinical Intake:  Pre-visit preparation completed: Yes  Pain : No/denies pain Pain Score: 0-No pain     Nutritional Risks: None Diabetes: No  How often do you need to have someone help you when you read instructions,  pamphlets, or other written materials from your doctor or pharmacy?: 1 - Never  Diabetic?no  Interpreter Needed?: No      Activities of Daily Living In your present state of health, do you have any difficulty performing the following activities: 02/16/2021 09/04/2020  Hearing? N N  Vision? N N  Difficulty concentrating or making decisions? N N  Walking or climbing stairs? N N  Dressing or bathing? N N  Doing errands, shopping? N N  Preparing Food and eating ? N -  Using the Toilet? N -  In the past six months, have you accidently leaked urine? N -  Do you have problems with loss of bowel control? N -  Managing your Medications? N -  Managing your Finances? N -  Housekeeping or managing your Housekeeping? N -  Some recent data might be hidden    Patient Care Team: Lindell Spar, MD as PCP - General (Internal Medicine) Harl Bowie Alphonse Guild, MD as PCP - Cardiology (Cardiology) Gala Romney Cristopher Estimable, MD (Gastroenterology)  Indicate any recent Medical Services you may have received from other than [facility] providers in the past year (date may be approximate).     Assessment:   This is a routine wellness examination for [place].  Hearing/Vision screen Hearing Screening - Comments:: No hearing issues. Vision Screening - Comments:: Readers. Dr. Gershon Crane. Up to date.  Dietary issues and exercise activities discussed: Current Exercise Habits: Home exercise routine, Type of exercise: walking, Time (Minutes): 30, Frequency (Times/Week): 3, Weekly Exercise (Minutes/Week): 90, Intensity: Mild, Exercise limited by: cardiac condition(s)   Goals Addressed             This Visit's Progress    Quit Smoking         Depression Screen PHQ 2/9 Scores 02/16/2021 02/03/2021 10/02/2020 09/04/2020 01/22/2020 08/31/2019 08/30/2017  PHQ - 2 Score 0 0 0 0 0 0 0  PHQ- 9 Score - - - - - 0 -    Fall Risk Fall Risk  02/16/2021 02/03/2021 10/02/2020 09/04/2020 01/22/2020  Falls in the past year? 0 0 0 0 0  Number  falls in past yr: 0 0 0 0 -  Injury with Fall? 0 0 0 0 -  Risk for fall due to : No Fall Risks No Fall Risks No Fall Risks No Fall Risks No Fall Risks  Follow up Falls prevention discussed Falls evaluation completed Falls evaluation completed Falls evaluation completed Falls evaluation completed    FALL RISK PREVENTION PERTAINING TO THE HOME:  Any stairs in or around the home? No  If so, are there any without handrails? No  Home free of loose throw rugs in walkways, pet beds, electrical cords, etc? No  Adequate lighting in your home to reduce risk of falls? No   ASSISTIVE DEVICES UTILIZED TO PREVENT FALLS:  Life alert? No  Use of a cane, walker or w/c? No  Grab bars in the bathroom? Yes  Shower chair or bench in shower? Yes  Elevated toilet seat or a handicapped toilet? No   TIMED UP AND GO:  Was the test performed? No . Phone visit      Cognitive Function:     6CIT Screen 02/16/2021  What Year? 0 points  What month? 0 points  What time? 0 points  Count back from 20 0 points  Months in reverse 4 points  Repeat phrase 4 points  Total Score 8    Immunizations Immunization History  Administered Date(s) Administered   Fluad Quad(high Dose 65+) 01/22/2020   Influenza, High Dose Seasonal PF 01/26/2018   Influenza-Unspecified 01/24/2014, 02/22/2015, 01/12/2016, 02/21/2017   Moderna Sars-Covid-2 Vaccination 09/20/2019, 12/13/2019   Pneumococcal Conjugate-13 01/12/2016, 03/07/2018   Pneumococcal Polysaccharide-23 02/21/2017, 01/22/2020   Tdap 05/20/2011   Zoster Recombinat (Shingrix)  10/02/2020    TDAP status: Up to date  Flu Vaccine status: Due, Education has been provided regarding the importance of this vaccine. Advised may receive this vaccine at local pharmacy or Health Dept. Aware to provide a copy of the vaccination record if obtained from local pharmacy or Health Dept. Verbalized acceptance and understanding.  Pneumococcal vaccine status: Up to  date  Covid-19 vaccine status: Information provided on how to obtain vaccines.   Qualifies for Shingles Vaccine? Yes   Zostavax completed Yes   Shingrix Completed?: No.    Education has been provided regarding the importance of this vaccine. Patient has been advised to call insurance company to determine out of pocket expense if they have not yet received this vaccine. Advised may also receive vaccine at local pharmacy or Health Dept. Verbalized acceptance and understanding.  Screening Tests Health Maintenance  Topic Date Due   COVID-19 Vaccine (3 - Booster for Moderna series) 05/14/2020   Zoster Vaccines- Shingrix (2 of 2) 11/27/2020   INFLUENZA VACCINE  12/01/2020   TETANUS/TDAP  05/19/2021   COLONOSCOPY (Pts 45-71yrs Insurance coverage will need to be confirmed)  05/21/2025   Hepatitis C Screening  Completed   HPV VACCINES  Aged Out    Health Maintenance  Health Maintenance Due  Topic Date Due   COVID-19 Vaccine (3 - Booster for Moderna series) 05/14/2020   Zoster Vaccines- Shingrix (2 of 2) 11/27/2020   INFLUENZA VACCINE  12/01/2020    Colorectal cancer screening: Type of screening: Colonoscopy. Completed 05/22/2015. Repeat every 10 years  Lung Cancer Screening: (Low Dose CT Chest recommended if Age 37-80 years, 30 pack-year currently smoking OR have quit w/in 15years.) does qualify.   Lung Cancer Screening Referral: Declined  Additional Screening:  Hepatitis C Screening: does qualify; Completed 12/28/2016  Vision Screening: Recommended annual ophthalmology exams for early detection of glaucoma and other disorders of the eye. Is the patient up to date with their annual eye exam?  Yes  Who is the provider or what is the name of the office in which the patient attends annual eye exams? Dr. Gershon Crane If pt is not established with a provider, would they like to be referred to a provider to establish care? No .   Dental Screening: Recommended annual dental exams for proper oral  hygiene  Community Resource Referral / Chronic Care Management: CRR required this visit?  No   CCM required this visit?  No      Plan:     I have personally reviewed and noted the following in the patient's chart:   Medical and social history Use of alcohol, tobacco or illicit drugs  Current medications and supplements including opioid prescriptions. Patient is not currently taking opioid prescriptions. Functional ability and status Nutritional status Physical activity Advanced directives List of other physicians Hospitalizations, surgeries, and ER visits in previous 12 months Vitals Screenings to include cognitive, depression, and falls Referrals and appointments  In addition, I have reviewed and discussed with patient certain preventive protocols, quality metrics, and best practice recommendations. A written personalized care plan for preventive services as well as general preventive health recommendations were provided to patient.     Chriss Driver, LPN   [id number]   Nurse Notes: Pt states he is doing well. Has appointment scheduled to do Flu and Shingles vaccines. Colonoscopy due 05/2025. 6CIT score of 8.

## 2021-02-16 NOTE — Patient Instructions (Signed)
[name] [name] , Thank you for taking time to come for your Medicare Wellness Visit. I appreciate your ongoing commitment to your health goals. Please review the following plan we discussed and let me know if I can assist you in the future.   Screening recommendations/referrals: Colonoscopy: Done 05/22/2015 Repeat in 10 years  Recommended yearly ophthalmology/optometry visit for glaucoma screening and checkup Recommended yearly dental visit for hygiene and checkup  Vaccinations: Influenza vaccine: Done 01/22/2020 Repeat annually  Pneumococcal vaccine: Done 03/07/2018 and 01/22/2020 Tdap vaccine: Done 05/20/2011 Repeat in 10 years  Shingles vaccine: Shingrix discussed. Please contact your pharmacy for coverage information.  Zoster done 10/02/2020    Covid-19: Done 09/20/2019 and 12/13/2019  Advanced directives: Advance directive discussed with you today. I have provided a copy for you to complete at home and have notarized. Once this is complete please bring a copy in to our office so we can scan it into your chart.   Conditions/risks identified: If you wish to quit smoking, help is available. For free tobacco cessation program offerings call the [facility] at [phone] or Live Well Line at (313) 285-8200. You may also visit [email or web address] or email [email or web address] for more information on other programs.   You may also call 1-800-QUIT-NOW 734-700-5540) or visit www.[employer].dk or www.[facility].org for additional resources on smoking cessation.    Next appointment: Follow up in one year for your annual wellness visit. 2023.  Preventive Care 22 Years and Older, Male  Preventive care refers to lifestyle choices and visits with your health care provider that can promote health and wellness. What does preventive care include? A yearly physical exam. This is also called an annual well check. Dental exams once or twice a year. Routine eye exams. Ask your health  care provider how often you should have your eyes checked. Personal lifestyle choices, including: Daily care of your teeth and gums. Regular physical activity. Eating a healthy diet. Avoiding tobacco and drug use. Limiting alcohol use. Practicing safe sex. Taking low doses of aspirin every day. Taking vitamin and mineral supplements as recommended by your health care provider. What happens during an annual well check? The services and screenings done by your health care provider during your annual well check will depend on your age, overall health, lifestyle risk factors, and family history of disease. Counseling  Your health care provider may ask you questions about your: Alcohol use. Tobacco use. Drug use. Emotional well-being. Home and relationship well-being. Sexual activity. Eating habits. History of falls. Memory and ability to understand (cognition). Work and work Statistician. Screening  You may have the following tests or measurements: Height, weight, and BMI. Blood pressure. Lipid and cholesterol levels. These may be checked every 5 years, or more frequently if you are over 71 years old. Skin check. Lung cancer screening. You may have this screening every year starting at age 7 if you have a 30-pack-year history of smoking and currently smoke or have quit within the past 15 years. Fecal occult blood test (FOBT) of the stool. You may have this test every year starting at age 84. Flexible sigmoidoscopy or colonoscopy. You may have a sigmoidoscopy every 5 years or a colonoscopy every 10 years starting at age 15. Prostate cancer screening. Recommendations will vary depending on your family history and other risks. Hepatitis C blood test. Hepatitis B blood test. Sexually transmitted disease (STD) testing. Diabetes screening. This is done by checking your blood sugar (glucose) after you have not eaten for a while (  fasting). You may have this done every 1-3 years. Abdominal  aortic aneurysm (AAA) screening. You may need this if you are a current or former smoker. Osteoporosis. You may be screened starting at age 84 if you are at high risk. Talk with your health care provider about your test results, treatment options, and if necessary, the need for more tests. Vaccines  Your health care provider may recommend certain vaccines, such as: Influenza vaccine. This is recommended every year. Tetanus, diphtheria, and acellular pertussis (Tdap, Td) vaccine. You may need a Td booster every 10 years. Zoster vaccine. You may need this after age 73. Pneumococcal 13-valent conjugate (PCV13) vaccine. One dose is recommended after age 20. Pneumococcal polysaccharide (PPSV23) vaccine. One dose is recommended after age 68. Talk to your health care provider about which screenings and vaccines you need and how often you need them. This information is not intended to replace advice given to you by your health care provider. Make sure you discuss any questions you have with your health care provider. Document Released: 05/16/2015 Document Revised: 01/07/2016 Document Reviewed: 02/18/2015 Elsevier Interactive Patient Education  2017 [place] Prevention in the Home Falls can cause injuries. They can happen to people of all ages. There are many things you can do to make your home safe and to help prevent falls. What can I do on the outside of my home? Regularly fix the edges of walkways and driveways and fix any cracks. Remove anything that might make you trip as you walk through a door, such as a raised step or threshold. Trim any bushes or trees on the path to your home. Use bright outdoor lighting. Clear any walking paths of anything that might make someone trip, such as rocks or tools. Regularly check to see if handrails are loose or broken. Make sure that both sides of any steps have handrails. Any raised decks and porches should have guardrails on the edges. Have any  leaves, snow, or ice cleared regularly. Use sand or salt on walking paths during winter. Clean up any spills in your garage right away. This includes oil or grease spills. What can I do in the bathroom? Use night lights. Install grab bars by the toilet and in the tub and shower. Do not use towel bars as grab bars. Use non-skid mats or decals in the tub or shower. If you need to sit down in the shower, use a plastic, non-slip stool. Keep the floor dry. Clean up any water that spills on the floor as soon as it happens. Remove soap buildup in the tub or shower regularly. Attach bath mats securely with double-sided non-slip rug tape. Do not have throw rugs and other things on the floor that can make you trip. What can I do in the bedroom? Use night lights. Make sure that you have a light by your bed that is easy to reach. Do not use any sheets or blankets that are too big for your bed. They should not hang down onto the floor. Have a firm chair that has side arms. You can use this for support while you get dressed. Do not have throw rugs and other things on the floor that can make you trip. What can I do in the kitchen? Clean up any spills right away. Avoid walking on wet floors. Keep items that you use a lot in easy-to-reach places. If you need to reach something above you, use a strong step stool that has a grab bar.  Keep electrical cords out of the way. Do not use floor polish or wax that makes floors slippery. If you must use wax, use non-skid floor wax. Do not have throw rugs and other things on the floor that can make you trip. What can I do with my stairs? Do not leave any items on the stairs. Make sure that there are handrails on both sides of the stairs and use them. Fix handrails that are broken or loose. Make sure that handrails are as long as the stairways. Check any carpeting to make sure that it is firmly attached to the stairs. Fix any carpet that is loose or worn. Avoid  having throw rugs at the top or bottom of the stairs. If you do have throw rugs, attach them to the floor with carpet tape. Make sure that you have a light switch at the top of the stairs and the bottom of the stairs. If you do not have them, ask someone to add them for you. What else can I do to help prevent falls? Wear shoes that: Do not have high heels. Have rubber bottoms. Are comfortable and fit you well. Are closed at the toe. Do not wear sandals. If you use a stepladder: Make sure that it is fully opened. Do not climb a closed stepladder. Make sure that both sides of the stepladder are locked into place. Ask someone to hold it for you, if possible. Clearly mark and make sure that you can see: Any grab bars or handrails. First and last steps. Where the edge of each step is. Use tools that help you move around (mobility aids) if they are needed. These include: Canes. Walkers. Scooters. Crutches. Turn on the lights when you go into a dark area. Replace any light bulbs as soon as they burn out. Set up your furniture so you have a clear path. Avoid moving your furniture around. If any of your floors are uneven, fix them. If there are any pets around you, be aware of where they are. Review your medicines with your doctor. Some medicines can make you feel dizzy. This can increase your chance of falling. Ask your doctor what other things that you can do to help prevent falls. This information is not intended to replace advice given to you by your health care provider. Make sure you discuss any questions you have with your health care provider. Document Released: 02/13/2009 Document Revised: 09/25/2015 Document Reviewed: 05/24/2014 Elsevier Interactive Patient Education  2017 [employer].

## 2021-02-17 ENCOUNTER — Encounter: Payer: Self-pay | Admitting: Internal Medicine

## 2021-02-17 ENCOUNTER — Ambulatory Visit (INDEPENDENT_AMBULATORY_CARE_PROVIDER_SITE_OTHER): Payer: Medicare Other | Admitting: Internal Medicine

## 2021-02-17 VITALS — BP 146/62 | HR 73 | Temp 97.7°F | Ht 72.0 in | Wt 187.1 lb

## 2021-02-17 DIAGNOSIS — I10 Essential (primary) hypertension: Secondary | ICD-10-CM

## 2021-02-17 DIAGNOSIS — Z131 Encounter for screening for diabetes mellitus: Secondary | ICD-10-CM

## 2021-02-17 DIAGNOSIS — Z8673 Personal history of transient ischemic attack (TIA), and cerebral infarction without residual deficits: Secondary | ICD-10-CM

## 2021-02-17 DIAGNOSIS — N184 Chronic kidney disease, stage 4 (severe): Secondary | ICD-10-CM | POA: Diagnosis not present

## 2021-02-17 DIAGNOSIS — J449 Chronic obstructive pulmonary disease, unspecified: Secondary | ICD-10-CM

## 2021-02-17 DIAGNOSIS — I480 Paroxysmal atrial fibrillation: Secondary | ICD-10-CM

## 2021-02-17 DIAGNOSIS — I6930 Unspecified sequelae of cerebral infarction: Secondary | ICD-10-CM | POA: Diagnosis not present

## 2021-02-17 DIAGNOSIS — Z23 Encounter for immunization: Secondary | ICD-10-CM | POA: Diagnosis not present

## 2021-02-17 DIAGNOSIS — R351 Nocturia: Secondary | ICD-10-CM

## 2021-02-17 DIAGNOSIS — N400 Enlarged prostate without lower urinary tract symptoms: Secondary | ICD-10-CM | POA: Insufficient documentation

## 2021-02-17 DIAGNOSIS — N401 Enlarged prostate with lower urinary tract symptoms: Secondary | ICD-10-CM

## 2021-02-17 MED ORDER — ALBUTEROL SULFATE (2.5 MG/3ML) 0.083% IN NEBU: INHALATION_SOLUTION | RESPIRATORY_TRACT | 0 refills | Status: DC

## 2021-02-17 NOTE — Assessment & Plan Note (Signed)
On Flomax Check PSA 

## 2021-02-17 NOTE — Assessment & Plan Note (Signed)
On Statin On Coumadin for A Fib.

## 2021-02-17 NOTE — Patient Instructions (Signed)
Please continue to take medications as prescribed.  Please follow low salt diet and ambulate as tolerated.  Please try to avoid passive smoking exposure as well.  Schedule an appointment with Nephrologist.

## 2021-02-17 NOTE — Progress Notes (Signed)
Established Patient Office Visit  Subjective:  Patient ID: [name] [name], male    DOB: 1950/09/03  Age: 70 y.o. MRN: [id number]  CC:  Chief Complaint  Patient presents with   Follow-up    4 month F/U HTN and 2nd Shingrix vaccine    HPI [name] [name] is a 70 year old male with PMH of CAD s/p stent placement, AAA s/p repair, paroxysmal A Fib, CKD stage 4, HTN, COPD, GERD and HLD who presents for f/u of his chronic medical conditions.   HTN: His BP was better today upon repeat check. BP is well-controlled. Takes medications regularly. Patient denies headache, dizziness, chest pain, dyspnea or palpitations.   COPD: He feels better now, but still has chronic cough. He has compeleted Azithromycin recently for acute bronchitis. He is using Breo regularly. Currently denies dyspnea or wheezing.  CKD stage 4: Denies any dysuria or hematuria. He needs to follow up with Nephrology. Takes Flomax for BPH, has nocturia at times, but overall well-controlled.  He received flu vaccine and second dose of Shingrix vaccine in the office today.     Past Medical History:  Diagnosis Date   AAA (abdominal aortic aneurysm)    a. s/p repair 06/2015 with left renal artery bypass at that time, post-op course c/b renal failure requiring dialysis, C dif.   Anemia    Arteriosclerotic cardiovascular disease (ASCVD)    a. AMI in 2000 treated at [facility]. b. cath in 12/2006->  Chronic total obstruction of the RCA;  drug-eluting stent placed in OM1, LVEF abnormal.   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   PVD (peripheral vascular disease) ([place])    Ct angiogram in 2009 revealed stable disease with 80% celiac stenosis,50% right renal artery ,ASCVD with ulceration in the abdominal aortashe   Testicular carcinoma ([place]) 1990   right orchiectomy   Tobacco abuse, in remission    20 pack years; quit in 2009    Past Surgical History:  Procedure Laterality Date   ABDOMINAL AORTIC ANEURYSM REPAIR N/A 06/09/2015   Procedure: ANEURYSM ABDOMINAL AORTIC REPAIR;  Surgeon: Elam Dutch, MD;  Location: [place];  Service: Vascular;  Laterality: N/A;   AORTIC ENDARTERECETOMY N/A 06/09/2015   Procedure: AORTIC ENDARTERECETOMY;  Surgeon: Elam Dutch, MD;  Location: [place];  Service: Vascular;  Laterality: N/A;   AORTIC/RENAL BYPASS Left 06/09/2015   Procedure: LEFT RENAL Artery BYPASS;  Surgeon: Elam Dutch, MD;  Location: [place];  Service: Vascular;  Laterality: Left;   APPENDECTOMY  2004   CHOLECYSTECTOMY N/A 09/18/2018   Procedure: LAPAROSCOPIC CHOLECYSTECTOMY;  Surgeon: Aviva Signs, MD;  Location[facility];  Service: General;  Laterality: N/A;   COLONOSCOPY  06/17/2011   INCOMPLETE, PREP POOR. Procedure: COLONOSCOPY;  Surgeon: Daneil Dolin, MD;  [facility];  Service: Endoscopy;  Laterality: N/A;  10:00   COLONOSCOPY  07/15/2011   NWG:NFAOZHYQ rectal and colon polyps   COLONOSCOPY N/A 05/22/2015   MVH:QIONGEXB colonic  and rectal polyps. tubular adenomas.repeat TCS 05/2018   ESOPHAGEAL DILATION N/A 05/22/2015   Procedure: ESOPHAGEAL DILATION;  Surgeon: Daneil Dolin, MD;  [facility];  Service: Endoscopy;  Laterality: N/A;   ESOPHAGOGASTRODUODENOSCOPY  06/17/2011   severe erosive/ulcerative reflux esophagitis, soft noncritical stricture dilatied, small hh, antral erosion    ESOPHAGOGASTRODUODENOSCOPY N/A 05/22/2015   RMR: 2 cm HH otherwise normal. s/p empirical dilation.   INSERTION  OF DIALYSIS CATHETER Left 06/26/2015   Procedure: INSERTION OF DIALYSIS CATHETER;  Surgeon: Angelia Mould, MD;  Location: [place];  Service: Vascular;  Laterality: Left;   PERIPHERAL VASCULAR CATHETERIZATION N/A 05/28/2015   Procedure: Abdominal Aortogram;  Surgeon: Serafina Mitchell, MD;  Location: [place] [facility];  Service: Cardiovascular;  Laterality: N/A;   testicular cancer  1990   right orchiectomy    Family History  Problem Relation Age of Onset   Colon cancer Father 44       deceased   Prostate cancer Father    Heart disease Mother    Liver disease Neg Hx    Anesthesia problems Neg Hx    Hypotension Neg Hx    Malignant hyperthermia Neg Hx    Pseudochol deficiency Neg Hx     Social History   Socioeconomic History   Marital status: Married    Spouse name: Not on file   Number of children: 2   Years of education: Not on file   Highest education level: Not on file  Occupational History   Occupation: disabled    Employer: UNEMPLOYED  Tobacco Use   Smoking status: Former    Packs/day: 0.50    Years: 40.00    Pack years: 20.00    Types: Cigarettes    Quit date: 03/02/2019    Years since quitting: 1.9   Smokeless tobacco: Never   Tobacco comments:    1/2 pack daily  Vaping Use   Vaping Use: Never used  Substance and Sexual Activity   Alcohol use: No    Alcohol/week: 0.0 standard drinks   Drug use: No   Sexual activity: Yes    Birth control/protection: None  Other Topics Concern   Not on file  Social History Narrative   Not on file   Social Determinants of Health   Financial Resource Strain: Low Risk    Difficulty of Paying Living Expenses: Not hard at all  Food Insecurity: No Food Insecurity   Worried About Charity fundraiser in the Last Year: Never true   Ran Out of Food in the Last Year: Never true  Transportation Needs: No Transportation Needs   Lack of Transportation (Medical): No   Lack of Transportation (Non-Medical): No  Physical  Activity: Insufficiently Active   Days of Exercise per Week: 3 days   Minutes of Exercise per Session: 30 min  Stress: No Stress Concern Present   Feeling of Stress : Not at all  Social Connections: Socially Integrated   Frequency of Communication with Friends and Family: More than three times a week   Frequency of Social Gatherings with Friends and Family: More than three times a week   Attends Religious Services: More than 4 times per year   Active Member of [employer] or Organizations: Yes   Attends Music therapist: More than 4 times per year   Marital Status: Married  Human resources officer Violence: Not At Risk   Fear of Current or Ex-Partner: No   Emotionally Abused: No   Physically Abused:  No   Sexually Abused: No    Outpatient Medications Prior to Visit  Medication Sig Dispense Refill   albuterol (VENTOLIN HFA) 108 (90 Base) MCG/ACT inhaler INHALE 2 TO 4 PUFFS BY MOUTH EVERY 4 HOURS AS NEEDED FOR WHEEZING OR SHORTNESS OF BREATH 9 g 0   amLODipine (NORVASC) 10 MG tablet Take 1 tablet (10 mg total) by mouth daily. 90 tablet 2   atorvastatin (LIPITOR) 20 MG tablet Take 1 tablet (20 mg total) by mouth daily. 30 tablet 3   benzonatate (TESSALON) 100 MG capsule Take 1 capsule (100 mg total) by mouth 2 (two) times daily as needed for cough. 20 capsule 0   calcitRIOL (ROCALTROL) 0.25 MCG capsule Take 0.25 mcg by mouth daily.      Ferrous Sulfate (IRON) 325 (65 Fe) MG TABS TAKE 1 TABLET BY MOUTH THREE TIMES DAILY 180 tablet 0   fluticasone (FLONASE) 50 MCG/ACT nasal spray Place 1 spray into both nostrils daily as needed for allergies or rhinitis.     fluticasone furoate-vilanterol (BREO ELLIPTA) 100-25 MCG/INH AEPB Inhale 1 puff into the lungs daily. 1 each 11   furosemide (LASIX) 40 MG tablet TAKE 1 TABLET ALTERNATING WITH ONE-HALF TABLET EVERY OTHER DAY 45 tablet 3   hydrALAZINE (APRESOLINE) 50 MG tablet Take 1.5 tablets (75 mg total) by mouth 3 (three) times daily. 405 tablet 3    metoprolol succinate (TOPROL-XL) 100 MG 24 hr tablet TAKE 1 & 1/2 (ONE & ONE-HALF) TABLETS BY MOUTH ONCE DAILY 135 tablet 2   nitroGLYCERIN (NITROSTAT) 0.4 MG SL tablet place 1 tablet under the tongue if needed every 5 minutes for chest pain for 3 doses IF NO RELIEF AFTER 3RD DOSE CALL PRESCRIBER OR 911. 25 tablet 2   pantoprazole (PROTONIX) 40 MG tablet Take 1 tablet by mouth once daily 90 tablet 0   tamsulosin (FLOMAX) 0.4 MG CAPS capsule TAKE 1 CAPSULE BY MOUTH ONCE DAILY AFTER  SUPPER. 90 capsule 1   warfarin (COUMADIN) 5 MG tablet TAKE 1 TABLET BY MOUTH ONCE DAILY EXCEPT 1.5 TABLETS ON TUESDAYS 100 tablet 3   albuterol (PROVENTIL) (2.5 MG/3ML) 0.083% nebulizer solution USE 1 VIAL IN NEBULIZER EVERY 4 HOURS AS NEEDED FOR WHEEZING OR SHORTNESS OF BREATH 180 mL 0   amoxicillin-clavulanate (AUGMENTIN) 875-125 MG tablet Take 1 tablet by mouth 2 (two) times daily. 14 tablet 0   No facility-administered medications prior to visit.    No Known Allergies  ROS Review of Systems  Constitutional:  Negative for chills and fever.  HENT:  Negative for congestion and sore throat.   Eyes:  Negative for pain and discharge.  Respiratory:  Positive for cough (chronic). Negative for shortness of breath.   Cardiovascular:  Negative for chest pain and palpitations.  Gastrointestinal:  Negative for constipation, diarrhea, nausea and vomiting.  Endocrine: Negative for polydipsia and polyuria.  Genitourinary:  Negative for dysuria and hematuria.  Musculoskeletal:  Negative for neck pain and neck stiffness.  Skin:  Negative for rash.  Neurological:  Negative for dizziness, weakness, numbness and headaches.  Psychiatric/Behavioral:  Negative for agitation and behavioral problems.      Objective:    Physical Exam Vitals reviewed.  Constitutional:      General: He is not in acute distress.    Appearance: He is not diaphoretic.  HENT:     Head: Normocephalic and atraumatic.     Nose: Nose normal.      Mouth/Throat:     Mouth: Mucous membranes are moist.  Eyes:     General: No scleral icterus.    Extraocular Movements: Extraocular movements intact.  Cardiovascular:     Rate and Rhythm: Normal rate and regular rhythm.     Pulses: Normal pulses.     Heart sounds: Normal heart sounds. No murmur heard. Pulmonary:     Breath sounds: Normal breath sounds. No wheezing or rales.  Musculoskeletal:     Cervical back: Neck supple. No tenderness.     Right lower leg: Edema (1+) present.     Left lower leg: Edema (1+) present.  Skin:    General: Skin is warm.     Findings: No rash.  Neurological:     General: No focal deficit present.     Mental Status: He is alert and oriented to person, place, and time.  Psychiatric:        Mood and Affect: Mood normal.        Behavior: Behavior normal.    BP (!) 146/62 (BP Location: Left Arm, Cuff Size: Normal)   Pulse 73   Temp 97.7 F (36.5 C) (Oral)   Ht 6' (1.829 m)   Wt 187 lb 1.3 oz (84.9 kg)   SpO2 98%   BMI 25.37 kg/m  Wt Readings from Last 3 Encounters:  02/17/21 187 lb 1.3 oz (84.9 kg)  02/16/21 198 lb (89.8 kg)  12/17/20 198 lb (89.8 kg)     Health Maintenance Due  Topic Date Due   COVID-19 Vaccine (3 - Booster for Moderna series) 05/14/2020    There are no preventive care reminders to display for this patient.  Lab Results  Component Value Date   TSH 1.03 07/27/2017   Lab Results  Component Value Date   WBC 3.5 (L) 08/24/2020   HGB 13.4 08/24/2020   HCT 41.4 08/24/2020   MCV 92.8 08/24/2020   PLT 156 08/24/2020   Lab Results  Component Value Date   NA 138 08/24/2020   K 4.3 08/24/2020   CO2 21 (L) 08/24/2020   GLUCOSE 112 (H) 08/24/2020   BUN 29 (H) 08/24/2020   CREATININE 3.41 (H) 08/24/2020   BILITOT 0.3 01/22/2020   ALKPHOS 107 10/16/2019   AST 10 01/22/2020   ALT 8 (L) 01/22/2020   PROT 6.2 01/22/2020   ALBUMIN 3.7 10/16/2019   CALCIUM 9.5 08/24/2020   ANIONGAP 10 08/24/2020   Lab Results   Component Value Date   CHOL 145 01/22/2020   Lab Results  Component Value Date   HDL 30 (L) 01/22/2020   Lab Results  Component Value Date   LDLCALC 78 01/22/2020   Lab Results  Component Value Date   TRIG 303 (H) 01/22/2020   Lab Results  Component Value Date   CHOLHDL 4.8 01/22/2020   No results found for: HGBA1C    Assessment & Plan:   Problem List Items Addressed This Visit       Cardiovascular and Mediastinum   Essential hypertension (Chronic)    BP Readings from Last 1 Encounters:  02/17/21 (!) 146/62  Elevated today, but usually well-controlled with Amlodipine, Metoprolol and Hydralazine - followed by Cardiology and Nephrology Counseled for compliance with the medications Advised DASH diet and moderate exercise/walking as tolerated      Relevant Orders   CBC   CMP14+EGFR   Paroxysmal A-fib ([place])    On Metoprolol for rate control On Coumadin Follows up with [facility] clinic        Respiratory   COPD technically gold 3 but with  restrictive component     Recently quit smoking Had acute bronchitis recently, better with Azithromycin now Well-controlled with Breo now Albuterol PRN      Relevant Medications   albuterol (PROVENTIL) (2.5 MG/3ML) 0.083% nebulizer solution     Genitourinary   Chronic kidney disease    F/u with Nephrology - Dr Justin Mend Avoid nephrotoxic agents On Calcitriol On iron supplements On Lasix      Relevant Orders   CMP14+EGFR   BPH (benign prostatic hyperplasia)    On Flomax Check PSA      Relevant Orders   PSA     Other   History of CVA (cerebrovascular accident)    On Statin On Coumadin for A Fib.      Relevant Orders   Lipid panel   Other Visit Diagnoses     Chronic obstructive pulmonary disease, unspecified COPD type ([place])    -  Primary   Relevant Medications   albuterol (PROVENTIL) (2.5 MG/3ML) 0.083% nebulizer solution   Screening for diabetes mellitus (DM)       Relevant Orders   HgB A1c   Need for viral  immunization       Relevant Orders   Varicella-zoster vaccine IM (Shingrix) (Completed)   Need for immunization against influenza       Relevant Orders   Flu Vaccine QUAD High Dose(Fluad) (Completed)       Meds ordered this encounter  Medications   albuterol (PROVENTIL) (2.5 MG/3ML) 0.083% nebulizer solution    Sig: USE 1 VIAL IN NEBULIZER EVERY 4 HOURS AS NEEDED FOR WHEEZING OR SHORTNESS OF BREATH    Dispense:  180 mL    Refill:  0    Follow-up: Return in about 5 months (around 07/18/2021) for HTN and COPD.    Lindell Spar, MD

## 2021-02-17 NOTE — Assessment & Plan Note (Signed)
F/u with Nephrology - Dr Justin Mend Avoid nephrotoxic agents On Calcitriol On iron supplements On Lasix

## 2021-02-17 NOTE — Assessment & Plan Note (Signed)
Recently quit smoking Had acute bronchitis recently, better with Azithromycin now Well-controlled with Breo now Albuterol PRN

## 2021-02-17 NOTE — Assessment & Plan Note (Signed)
On Metoprolol for rate control ?On Coumadin ?Follows up with [facility] ?

## 2021-02-17 NOTE — Assessment & Plan Note (Signed)
BP Readings from Last 1 Encounters:  02/17/21 (!) 146/62   Elevated today, but usually well-controlled with Amlodipine, Metoprolol and Hydralazine - followed by Cardiology and Nephrology Counseled for compliance with the medications Advised DASH diet and moderate exercise/walking as tolerated

## 2021-02-18 LAB — CMP14+EGFR
ALT: 16 IU/L (ref 0–44)
AST: 14 IU/L (ref 0–40)
Albumin/Globulin Ratio: 1.5 (ref 1.2–2.2)
Albumin: 4 g/dL (ref 3.8–4.8)
Alkaline Phosphatase: 125 IU/L — ABNORMAL HIGH (ref 44–121)
BUN/Creatinine Ratio: 9 — ABNORMAL LOW (ref 10–24)
BUN: 33 mg/dL — ABNORMAL HIGH (ref 8–27)
Bilirubin Total: 0.3 mg/dL (ref 0.0–1.2)
CO2: 17 mmol/L — ABNORMAL LOW (ref 20–29)
Calcium: 10.1 mg/dL (ref 8.6–10.2)
Chloride: 109 mmol/L — ABNORMAL HIGH (ref 96–106)
Creatinine, Ser: 3.79 mg/dL — ABNORMAL HIGH (ref 0.76–1.27)
Globulin, Total: 2.7 g/dL (ref 1.5–4.5)
Glucose: 126 mg/dL — ABNORMAL HIGH (ref 70–99)
Potassium: 4.4 mmol/L (ref 3.5–5.2)
Sodium: 143 mmol/L (ref 134–144)
Total Protein: 6.7 g/dL (ref 6.0–8.5)
eGFR: 17 mL/min/{1.73_m2} — ABNORMAL LOW (ref >59)

## 2021-02-18 LAB — HEMOGLOBIN A1C
Est. average glucose Bld gHb Est-mCnc: 111 mg/dL
Hgb A1c MFr Bld: 5.5 % (ref 4.8–5.6)

## 2021-02-18 LAB — LIPID PANEL
Chol/HDL Ratio: 4.5 ratio (ref 0.0–5.0)
Cholesterol, Total: 112 mg/dL (ref 100–199)
HDL: 25 mg/dL — ABNORMAL LOW (ref >39)
LDL Chol Calc (NIH): 56 mg/dL (ref 0–99)
Triglycerides: 183 mg/dL — ABNORMAL HIGH (ref 0–149)
VLDL Cholesterol Cal: 31 mg/dL (ref 5–40)

## 2021-02-18 LAB — CBC
Hematocrit: 32.8 % — ABNORMAL LOW (ref 37.5–51.0)
Hemoglobin: 10.8 g/dL — ABNORMAL LOW (ref 13.0–17.7)
MCH: 27.6 pg (ref 26.6–33.0)
MCHC: 32.9 g/dL (ref 31.5–35.7)
MCV: 84 fL (ref 79–97)
Platelets: 206 10*3/uL (ref 150–450)
RBC: 3.92 x10E6/uL — ABNORMAL LOW (ref 4.14–5.80)
RDW: 15 % (ref 11.6–15.4)
WBC: 6.2 10*3/uL (ref 3.4–10.8)

## 2021-02-18 LAB — PSA: Prostate Specific Ag, Serum: 1 ng/mL (ref 0.0–4.0)

## 2021-03-02 ENCOUNTER — Other Ambulatory Visit: Payer: Self-pay

## 2021-03-02 ENCOUNTER — Ambulatory Visit (INDEPENDENT_AMBULATORY_CARE_PROVIDER_SITE_OTHER): Payer: Medicare Other | Admitting: *Deleted

## 2021-03-02 DIAGNOSIS — Z5181 Encounter for therapeutic drug level monitoring: Secondary | ICD-10-CM | POA: Diagnosis not present

## 2021-03-02 DIAGNOSIS — I4891 Unspecified atrial fibrillation: Secondary | ICD-10-CM | POA: Diagnosis not present

## 2021-03-02 LAB — POCT INR: INR: 2.5 (ref 2.0–3.0)

## 2021-03-02 NOTE — Patient Instructions (Signed)
Continue warfarin 1 tablet daily except 1/2 tablet on Sundays Recheck in 6 weeks 

## 2021-03-12 ENCOUNTER — Other Ambulatory Visit: Payer: Self-pay | Admitting: Internal Medicine

## 2021-03-16 ENCOUNTER — Other Ambulatory Visit: Payer: Self-pay | Admitting: *Deleted

## 2021-03-16 MED ORDER — ATORVASTATIN CALCIUM 20 MG PO TABS: 20.0000 mg | ORAL_TABLET | Freq: Every day | ORAL | 3 refills | Status: DC

## 2021-04-09 ENCOUNTER — Other Ambulatory Visit: Payer: Self-pay | Admitting: Internal Medicine

## 2021-04-13 ENCOUNTER — Ambulatory Visit (INDEPENDENT_AMBULATORY_CARE_PROVIDER_SITE_OTHER): Payer: Medicare Other | Admitting: *Deleted

## 2021-04-13 DIAGNOSIS — I4891 Unspecified atrial fibrillation: Secondary | ICD-10-CM

## 2021-04-13 DIAGNOSIS — Z5181 Encounter for therapeutic drug level monitoring: Secondary | ICD-10-CM

## 2021-04-13 LAB — POCT INR: INR: 1.9 — AB (ref 2.0–3.0)

## 2021-04-13 NOTE — Patient Instructions (Signed)
Take warfarin 1 1/2 tablets tonight then resume 1 tablet daily except 1/2 tablet on Sundays Recheck in 6 weeks

## 2021-04-18 ENCOUNTER — Other Ambulatory Visit: Payer: Self-pay | Admitting: Internal Medicine

## 2021-04-18 DIAGNOSIS — J449 Chronic obstructive pulmonary disease, unspecified: Secondary | ICD-10-CM

## 2021-04-22 ENCOUNTER — Ambulatory Visit (INDEPENDENT_AMBULATORY_CARE_PROVIDER_SITE_OTHER): Payer: Medicare Other | Admitting: Nurse Practitioner

## 2021-04-22 ENCOUNTER — Encounter: Payer: Self-pay | Admitting: Nurse Practitioner

## 2021-04-22 ENCOUNTER — Other Ambulatory Visit: Payer: Self-pay

## 2021-04-22 DIAGNOSIS — J209 Acute bronchitis, unspecified: Secondary | ICD-10-CM | POA: Diagnosis not present

## 2021-04-22 DIAGNOSIS — J440 Chronic obstructive pulmonary disease with acute lower respiratory infection: Secondary | ICD-10-CM | POA: Diagnosis not present

## 2021-04-22 MED ORDER — AMOXICILLIN-POT CLAVULANATE 875-125 MG PO TABS: 1.0000 | ORAL_TABLET | Freq: Two times a day (BID) | ORAL | 0 refills | Status: DC

## 2021-04-22 MED ORDER — PREDNISONE 20 MG PO TABS: 40.0000 mg | ORAL_TABLET | Freq: Every day | ORAL | 0 refills | Status: DC

## 2021-04-22 NOTE — Progress Notes (Signed)
Acute Office Visit  Subjective:    Patient ID: [name] [name], male    DOB: Sep 12, 1950, 70 y.o.   MRN: [id number]  Chief Complaint  Patient presents with   Cough    Cough x 2 weeks    Cough Associated symptoms include rhinorrhea, shortness of breath and wheezing. Pertinent negatives include no chills, fever or sore throat.  Patient is in today for sick visit. Symptoms started 2 weeks ago. No recent sick contacts. He has been taking mucinex and robitussin, and tylenol.  Past Medical History:  Diagnosis Date   AAA (abdominal aortic aneurysm)    a. s/p repair 06/2015 with left renal artery bypass at that time, post-op course c/b renal failure requiring dialysis, C dif.   Anemia    Arteriosclerotic cardiovascular disease (ASCVD)    a. AMI in 2000 treated at [facility]. b. cath in 12/2006->  Chronic total obstruction of the RCA;  drug-eluting stent placed in OM1, LVEF abnormal.   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)    Ct angiogram in 2009 revealed stable disease with 80% celiac stenosis,50% right renal artery ,ASCVD with ulceration in the abdominal aortashe   Testicular carcinoma ([place]) 1990   right orchiectomy   Tobacco abuse, in remission    20 pack years; quit in 2009    Past Surgical History:  Procedure Laterality Date   ABDOMINAL AORTIC ANEURYSM REPAIR N/A 06/09/2015   Procedure: ANEURYSM ABDOMINAL AORTIC REPAIR;  Surgeon: Elam Dutch, MD;  Location: [place];  Service: Vascular;  Laterality: N/A;   AORTIC ENDARTERECETOMY N/A 06/09/2015   Procedure: AORTIC ENDARTERECETOMY;  Surgeon: Elam Dutch, MD;  Location: [place];  Service: Vascular;  Laterality: N/A;   AORTIC/RENAL BYPASS Left 06/09/2015   Procedure: LEFT RENAL Artery BYPASS;  Surgeon: Elam Dutch, MD;  Location: [place];  Service: Vascular;  Laterality: Left;   APPENDECTOMY  2004   CHOLECYSTECTOMY N/A 09/18/2018   Procedure: LAPAROSCOPIC CHOLECYSTECTOMY;  Surgeon: Aviva Signs, MD;  Location: [facility];  Service: General;  Laterality: N/A;   COLONOSCOPY  06/17/2011   INCOMPLETE, PREP POOR. Procedure: COLONOSCOPY;  Surgeon: Daneil Dolin, MD;  [facility];  Service: Endoscopy;  Laterality: N/A;  10:00   COLONOSCOPY  07/15/2011   YIF:OYDXAJOI rectal and colon polyps   COLONOSCOPY N/A 05/22/2015   NOM:VEHMCNOB colonic and rectal polyps. tubular adenomas.repeat TCS 05/2018   ESOPHAGEAL DILATION N/A 05/22/2015   Procedure: ESOPHAGEAL DILATION;  Surgeon: Daneil Dolin, MD;  [facility];  Service: Endoscopy;  Laterality: N/A;   ESOPHAGOGASTRODUODENOSCOPY  06/17/2011   severe erosive/ulcerative reflux esophagitis, soft noncritical stricture dilatied, small hh, antral erosion    ESOPHAGOGASTRODUODENOSCOPY N/A 05/22/2015   RMR: 2 cm HH otherwise normal. s/p empirical dilation.   INSERTION OF DIALYSIS CATHETER Left 06/26/2015   Procedure: INSERTION OF DIALYSIS CATHETER;  Surgeon: Angelia Mould, MD;  Location: [place];  Service: Vascular;  Laterality: Left;   PERIPHERAL VASCULAR CATHETERIZATION N/A 05/28/2015   Procedure: Abdominal Aortogram;  Surgeon: Serafina Mitchell, MD;  Location: [place] [facility];  Service: Cardiovascular;  Laterality: N/A;   testicular cancer  1990   right orchiectomy    Family History  Problem Relation Age of Onset   Colon cancer Father 37       deceased   Prostate cancer Father    Heart disease Mother    Liver  disease Neg Hx    Anesthesia problems Neg Hx    Hypotension Neg Hx    Malignant hyperthermia Neg Hx    Pseudochol deficiency Neg Hx     Social History   Socioeconomic History   Marital status: Married    Spouse name: Not on file   Number of children: 2   Years of education: Not on file   Highest education level: Not on file  Occupational History   Occupation: disabled    Employer: UNEMPLOYED  Tobacco Use   Smoking status: Former    Packs/day: 0.50    Years: 40.00    Pack years: 20.00    Types: Cigarettes    Quit date: 03/02/2019    Years since quitting: 2.1   Smokeless tobacco: Never   Tobacco comments:    1/2 pack daily  Vaping Use   Vaping Use: Never used  Substance and Sexual Activity   Alcohol use: No    Alcohol/week: 0.0 standard drinks   Drug use: No   Sexual activity: Yes    Birth control/protection: None  Other Topics Concern   Not on file  Social History Narrative   Not on file   Social Determinants of Health   Financial Resource Strain: Low Risk    Difficulty of Paying Living Expenses: Not hard at all  Food Insecurity: No Food Insecurity   Worried About Charity fundraiser in the Last Year: Never true   Ran Out of Food in the Last Year: Never true  Transportation Needs: No Transportation Needs   Lack of Transportation (Medical): No   Lack of Transportation (Non-Medical): No  Physical Activity: Insufficiently Active   Days of Exercise per Week: 3 days   Minutes of Exercise per Session: 30 min  Stress: No Stress Concern Present   Feeling of Stress : Not at all  Social Connections: Socially Integrated   Frequency of Communication with Friends and Family: More than three times a week   Frequency of Social Gatherings with Friends and Family: More than three times a week   Attends Religious Services: More than 4 times per year   Active Member of [employer] or Organizations: Yes   Attends Music therapist: More than 4 times per year   Marital  Status: Married  Human resources officer Violence: Not At Risk   Fear of Current or Ex-Partner: No   Emotionally Abused: No   Physically Abused: No   Sexually Abused: No    Outpatient Medications Prior to Visit  Medication Sig Dispense Refill   albuterol (PROVENTIL) (2.5 MG/3ML) 0.083% nebulizer solution USE 1 VIAL IN NEBULIZER EVERY 4 HOURS AS NEEDED FOR WHEEZING OR SHORTNESS OF BREATH 180 mL 0   albuterol (VENTOLIN HFA) 108 (90 Base) MCG/ACT inhaler INHALE 2 TO 4 PUFFS BY MOUTH EVERY 4 HOURS AS NEEDED FOR WHEEZING OR SHORTNESS OF BREATH 9 g 0   amLODipine (NORVASC) 10 MG tablet Take 1 tablet (10 mg total) by mouth daily. 90 tablet 2   atorvastatin (  LIPITOR) 20 MG tablet Take 1 tablet (20 mg total) by mouth daily. 30 tablet 3   benzonatate (TESSALON) 100 MG capsule Take 1 capsule (100 mg total) by mouth 2 (two) times daily as needed for cough. 20 capsule 0   calcitRIOL (ROCALTROL) 0.25 MCG capsule Take 0.25 mcg by mouth daily.      Ferrous Sulfate (IRON) 325 (65 Fe) MG TABS TAKE 1 TABLET BY MOUTH THREE TIMES DAILY 180 tablet 0   fluticasone (FLONASE) 50 MCG/ACT nasal spray Place 1 spray into both nostrils daily as needed for allergies or rhinitis.     fluticasone furoate-vilanterol (BREO ELLIPTA) 100-25 MCG/INH AEPB Inhale 1 puff into the lungs daily. 1 each 11   furosemide (LASIX) 40 MG tablet TAKE 1 TABLET ALTERNATING WITH ONE-HALF TABLET EVERY OTHER DAY 45 tablet 3   hydrALAZINE (APRESOLINE) 50 MG tablet Take 1.5 tablets (75 mg total) by mouth 3 (three) times daily. 405 tablet 3   metoprolol succinate (TOPROL-XL) 100 MG 24 hr tablet TAKE 1 & 1/2 (ONE & ONE-HALF) TABLETS BY MOUTH ONCE DAILY 135 tablet 2   nitroGLYCERIN (NITROSTAT) 0.4 MG SL tablet place 1 tablet under the tongue if needed every 5 minutes for chest pain for 3 doses IF NO RELIEF AFTER 3RD DOSE CALL PRESCRIBER OR 911. 25 tablet 2   pantoprazole (PROTONIX) 40 MG tablet Take 1 tablet by mouth once daily 90 tablet 0   tamsulosin  (FLOMAX) 0.4 MG CAPS capsule TAKE 1 CAPSULE BY MOUTH ONCE DAILY AFTER SUPPER 90 capsule 0   warfarin (COUMADIN) 5 MG tablet TAKE 1 TABLET BY MOUTH ONCE DAILY EXCEPT 1.5 TABLETS ON TUESDAYS 100 tablet 3   No facility-administered medications prior to visit.    No Known Allergies  Review of Systems  Constitutional:  Negative for chills, fatigue and fever.       Fever at onset, but this resolved  HENT:  Positive for congestion, rhinorrhea, sinus pressure and sinus pain. Negative for sore throat.   Respiratory:  Positive for cough, shortness of breath and wheezing.       Objective:    Physical Exam  There were no vitals taken for this visit. Wt Readings from Last 3 Encounters:  02/17/21 187 lb 1.3 oz (84.9 kg)  02/16/21 198 lb (89.8 kg)  12/17/20 198 lb (89.8 kg)    Health Maintenance Due  Topic Date Due   COVID-19 Vaccine (3 - Booster for Moderna series) 02/07/2020    There are no preventive care reminders to display for this patient.   Lab Results  Component Value Date   TSH 1.03 07/27/2017   Lab Results  Component Value Date   WBC 6.2 02/17/2021   HGB 10.8 (L) 02/17/2021   HCT 32.8 (L) 02/17/2021   MCV 84 02/17/2021   PLT 206 02/17/2021   Lab Results  Component Value Date   NA 143 02/17/2021   K 4.4 02/17/2021   CO2 17 (L) 02/17/2021   GLUCOSE 126 (H) 02/17/2021   BUN 33 (H) 02/17/2021   CREATININE 3.79 (H) 02/17/2021   BILITOT 0.3 02/17/2021   ALKPHOS 125 (H) 02/17/2021   AST 14 02/17/2021   ALT 16 02/17/2021   PROT 6.7 02/17/2021   ALBUMIN 4.0 02/17/2021   CALCIUM 10.1 02/17/2021   ANIONGAP 10 08/24/2020   EGFR 17 (L) 02/17/2021   Lab Results  Component Value Date   CHOL 112 02/17/2021   Lab Results  Component Value Date   HDL 25 (L) 02/17/2021  Lab Results  Component Value Date   LDLCALC 56 02/17/2021   Lab Results  Component Value Date   TRIG 183 (H) 02/17/2021   Lab Results  Component Value Date   CHOLHDL 4.5 02/17/2021   Lab  Results  Component Value Date   HGBA1C 5.5 02/17/2021       Assessment & Plan:   Problem List Items Addressed This Visit       Respiratory   Acute bronchitis with COPD ([place]) - Primary    -Rx. augmentin and prednisone; symptoms started 2 weeks ago -if no improvement in 1 week, return to clinic      Relevant Medications   amoxicillin-clavulanate (AUGMENTIN) 875-125 MG tablet   predniSONE (DELTASONE) 20 MG tablet     Meds ordered this encounter  Medications   amoxicillin-clavulanate (AUGMENTIN) 875-125 MG tablet    Sig: Take 1 tablet by mouth 2 (two) times daily.    Dispense:  14 tablet    Refill:  0   predniSONE (DELTASONE) 20 MG tablet    Sig: Take 2 tablets (40 mg total) by mouth daily with breakfast.    Dispense:  10 tablet    Refill:  0   Date:  04/22/2021   Location of Patient: Home Location of Provider: Office Consent was obtain for visit to be over via telehealth. I verified that I am speaking with the correct person using two identifiers.  I connected with  Kasyn E Bermingham on 04/22/21 via telephone and verified that I am speaking with the correct person using two identifiers.   I discussed the limitations of evaluation and management by telemedicine. The patient expressed understanding and agreed to proceed.  Time spent: 4 min     Noreene Larsson, NP

## 2021-04-22 NOTE — Assessment & Plan Note (Signed)
-  Rx. augmentin and prednisone; symptoms started 2 weeks ago -if no improvement in 1 week, return to clinic

## 2021-04-30 ENCOUNTER — Telehealth: Payer: Self-pay | Admitting: Internal Medicine

## 2021-04-30 NOTE — Telephone Encounter (Signed)
We do not do refills on these medications if pt is not feeling well will need an appt to discuss

## 2021-04-30 NOTE — Telephone Encounter (Signed)
Vaughan Basta called in on pt behalf in regard to   amoxicillin-clavulanate (AUGMENTIN) 875-125 MG tablet   predniSONE (DELTASONE) 20 MG tablet  States that while pt was taking meds pt was feeling better and now that meds have been completed pt isn't feeling as well   Pt is scheduled for 12/11   Pt wants to get refills on  predniSONE (DELTASONE) 20 MG tablet  amoxicillin-clavulanate (AUGMENTIN) 875-125 MG tablet    Until next appt

## 2021-05-01 NOTE — Telephone Encounter (Signed)
Pt aware , pt appt moved to 1/2

## 2021-05-04 ENCOUNTER — Ambulatory Visit (INDEPENDENT_AMBULATORY_CARE_PROVIDER_SITE_OTHER): Payer: Commercial Managed Care - HMO | Admitting: Internal Medicine

## 2021-05-04 ENCOUNTER — Other Ambulatory Visit: Payer: Self-pay

## 2021-05-04 ENCOUNTER — Encounter: Payer: Self-pay | Admitting: Internal Medicine

## 2021-05-04 VITALS — BP 164/68 | HR 84 | Resp 24 | Ht 72.0 in | Wt 195.0 lb

## 2021-05-04 DIAGNOSIS — J440 Chronic obstructive pulmonary disease with acute lower respiratory infection: Secondary | ICD-10-CM

## 2021-05-04 DIAGNOSIS — J209 Acute bronchitis, unspecified: Secondary | ICD-10-CM

## 2021-05-04 DIAGNOSIS — Z122 Encounter for screening for malignant neoplasm of respiratory organs: Secondary | ICD-10-CM | POA: Diagnosis not present

## 2021-05-04 DIAGNOSIS — D509 Iron deficiency anemia, unspecified: Secondary | ICD-10-CM

## 2021-05-04 DIAGNOSIS — I10 Essential (primary) hypertension: Secondary | ICD-10-CM | POA: Diagnosis not present

## 2021-05-04 DIAGNOSIS — N184 Chronic kidney disease, stage 4 (severe): Secondary | ICD-10-CM

## 2021-05-04 MED ORDER — ALBUTEROL SULFATE HFA 108 (90 BASE) MCG/ACT IN AERS: 1.0000 | INHALATION_SPRAY | Freq: Four times a day (QID) | RESPIRATORY_TRACT | 11 refills | Status: DC | PRN

## 2021-05-04 MED ORDER — TRELEGY ELLIPTA 100-62.5-25 MCG/ACT IN AEPB: 1.0000 | INHALATION_SPRAY | Freq: Every day | RESPIRATORY_TRACT | 11 refills | Status: DC

## 2021-05-04 MED ORDER — METHYLPREDNISOLONE SODIUM SUCC 125 MG IJ SOLR
125.0000 mg | Freq: Once | INTRAMUSCULAR | Status: AC
  Administered 2021-05-04: 125 mg via INTRAMUSCULAR

## 2021-05-04 MED ORDER — PREDNISONE 10 MG (21) PO TBPK: ORAL_TABLET | ORAL | 0 refills | Status: DC

## 2021-05-04 NOTE — Patient Instructions (Signed)
Please start using Trelegy as prescribed. Continue to use Albuterol as prescribed.  Please start taking Prednisone as prescribed.  Please continue to take other medications as prescribed.  Please schedule appointment with Dr Justin Mend.

## 2021-05-04 NOTE — Progress Notes (Signed)
Established Patient Office Visit  Subjective:  Patient ID: [name] [name], male    DOB: [birth year]  Age: 71 y.o. MRN: [id number]  CC:  Chief Complaint  Patient presents with   Follow-up    Medication follow up pt has been SOB for awhile now     HPI [name] [name] is a 71 y.o. male with past medical history of CAD s/p stent placement, AAA s/p repair, paroxysmal A Fib, CKD stage 4, HTN, COPD, GERD and HLD who presents for f/u of his chronic medical conditions.  COPD: He has been having dyspnea for about a month now.  He also complains of worsening of his chronic cough recently.  He was given Z-Pak and prednisone about 10 days ago by a different provider for COPD exacerbation, which improved his dyspnea for few days, but has been worse since he completed prednisone.  He denies any fever, chills, nasal congestion or sore throat currently.  He has quit smoking, but does report that his partner smokes at home.  He has been using Breo, but has apparently run out of her albuterol.  HTN: His BP was elevated in the office today upon multiple measurements.  He takes his medications regularly.  He denies any headache, dizziness, chest pain or palpitations.  CKD stage 4: Denies any dysuria or hematuria. He needs to follow up with Nephrology. Takes Flomax for BPH, has nocturia at times, but overall well-controlled.  Past Medical History:  Diagnosis Date   AAA (abdominal aortic aneurysm)    a. s/p repair 06/2015 with left renal artery bypass at that time, post-op course c/b renal failure requiring dialysis, C dif.   Anemia    Arteriosclerotic cardiovascular disease (ASCVD)    a. AMI in 2000 treated at [facility]. b. cath in 12/2006->  Chronic total obstruction of the RCA;  drug-eluting stent placed in OM1, LVEF abnormal.   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   PVD (peripheral vascular disease) ([place])    Ct angiogram in 2009 revealed stable disease with 80% celiac stenosis,50% right renal artery ,ASCVD with ulceration in the abdominal aortashe   Testicular carcinoma ([place]) 1990   right orchiectomy   Tobacco abuse, in remission    20 pack years; quit in 2009    Past Surgical History:  Procedure Laterality Date   ABDOMINAL AORTIC ANEURYSM REPAIR N/A 06/09/2015   Procedure: ANEURYSM ABDOMINAL AORTIC REPAIR;  Surgeon: Elam Dutch, MD;  Location: [place];  Service: Vascular;  Laterality: N/A;   AORTIC ENDARTERECETOMY N/A 06/09/2015   Procedure: AORTIC ENDARTERECETOMY;  Surgeon: Elam Dutch, MD;  Location: [place];  Service: Vascular;  Laterality: N/A;   AORTIC/RENAL BYPASS Left 06/09/2015   Procedure: LEFT RENAL Artery BYPASS;  Surgeon: Elam Dutch, MD;  Location: [place];  Service: Vascular;  Laterality: Left;   APPENDECTOMY  2004   CHOLECYSTECTOMY N/A 09/18/2018   Procedure: LAPAROSCOPIC CHOLECYSTECTOMY;  Surgeon: Aviva Signs, MD;  [facility];  Service: General;  Laterality: N/A;   COLONOSCOPY  06/17/2011   INCOMPLETE, PREP POOR. Procedure: COLONOSCOPY;  Surgeon: Daneil Dolin, MD;  [facility];  Service: Endoscopy;  Laterality: N/A;  10:00   COLONOSCOPY  07/15/2011   YQM:VHQIONGE rectal and colon polyps   COLONOSCOPY N/A 05/22/2015   XBM:[id number] colonic and rectal polyps. tubular adenomas.repeat TCS 05/2018   ESOPHAGEAL DILATION N/A 05/22/2015   Procedure: ESOPHAGEAL DILATION;  Surgeon: Daneil Dolin, MD;  [facility];  Service: Endoscopy;   Laterality: N/A;   ESOPHAGOGASTRODUODENOSCOPY  06/17/2011   severe erosive/ulcerative reflux esophagitis, soft noncritical stricture dilatied, small hh, antral erosion    ESOPHAGOGASTRODUODENOSCOPY N/A 05/22/2015   RMR: 2 cm HH otherwise normal. s/p empirical dilation.   INSERTION OF DIALYSIS CATHETER Left 06/26/2015   Procedure: INSERTION OF DIALYSIS CATHETER;  Surgeon: Angelia Mould, MD;  Location: [place];  Service: Vascular;  Laterality: Left;   PERIPHERAL VASCULAR CATHETERIZATION N/A 05/28/2015   Procedure: Abdominal Aortogram;  Surgeon: Serafina Mitchell, MD;  Location: [place] [facility];  Service: Cardiovascular;  Laterality: N/A;   testicular cancer  1990   right orchiectomy    Family History  Problem Relation Age of Onset   Colon cancer Father 57       deceased   Prostate cancer Father    Heart disease Mother    Liver disease Neg Hx    Anesthesia problems Neg Hx    Hypotension Neg Hx    Malignant hyperthermia Neg Hx    Pseudochol deficiency Neg Hx     Social History   Socioeconomic History   Marital status: Married    Spouse name: Not on file   Number of children: 2   Years of education: Not on file   Highest education level: Not on file  Occupational History   Occupation: disabled    Employer: UNEMPLOYED  Tobacco Use   Smoking status: Former    Packs/day: 0.50    Years: 40.00    Pack years: 20.00    Types: Cigarettes    Quit date: 03/02/2019    Years since quitting: 2.1   Smokeless tobacco: Never   Tobacco comments:    1/2 pack daily  Vaping Use   Vaping Use: Never used  Substance and Sexual Activity   Alcohol use: No    Alcohol/week: 0.0 standard drinks   Drug use: No   Sexual activity: Yes    Birth control/protection: None  Other Topics Concern   Not on file  Social History Narrative   Not on file   Social Determinants of Health   Financial Resource Strain: Low Risk    Difficulty of Paying Living Expenses: Not hard at all  Food Insecurity:  No Food Insecurity   Worried About Charity fundraiser in the Last Year: Never true   Ran Out of Food in the Last Year: Never true  Transportation Needs: No Transportation Needs   Lack of Transportation (Medical): No   Lack of Transportation (Non-Medical): No  Physical Activity: Insufficiently Active   Days of Exercise per Week: 3 days   Minutes of Exercise per Session: 30 min  Stress: No Stress Concern Present   Feeling of Stress : Not at all  Social Connections: Socially Integrated   Frequency of Communication with Friends and Family: More than three times a week   Frequency of Social Gatherings with Friends and Family: More than three times a week   Attends Religious Services:  More than 4 times per year   Active Member of Clubs or Organizations: Yes   Attends Archivist Meetings: More than 4 times per year   Marital Status: Married  Human resources officer Violence: Not At Risk   Fear of Current or Ex-Partner: No   Emotionally Abused: No   Physically Abused: No   Sexually Abused: No    Outpatient Medications Prior to Visit  Medication Sig Dispense Refill   albuterol (PROVENTIL) (2.5 MG/3ML) 0.083% nebulizer solution USE 1 VIAL IN NEBULIZER EVERY 4 HOURS AS NEEDED FOR WHEEZING OR SHORTNESS OF BREATH 180 mL 0   amLODipine (NORVASC) 10 MG tablet Take 1 tablet (10 mg total) by mouth daily. 90 tablet 2   atorvastatin (LIPITOR) 20 MG tablet Take 1 tablet (20 mg total) by mouth daily. 30 tablet 3   calcitRIOL (ROCALTROL) 0.25 MCG capsule Take 0.25 mcg by mouth daily.      Ferrous Sulfate (IRON) 325 (65 Fe) MG TABS TAKE 1 TABLET BY MOUTH THREE TIMES DAILY 180 tablet 0   fluticasone (FLONASE) 50 MCG/ACT nasal spray Place 1 spray into both nostrils daily as needed for allergies or rhinitis.     fluticasone furoate-vilanterol (BREO ELLIPTA) 100-25 MCG/INH AEPB Inhale 1 puff into the lungs daily. 1 each 11   furosemide (LASIX) 40 MG tablet TAKE 1 TABLET ALTERNATING WITH ONE-HALF TABLET  EVERY OTHER DAY 45 tablet 3   hydrALAZINE (APRESOLINE) 50 MG tablet Take 1.5 tablets (75 mg total) by mouth 3 (three) times daily. 405 tablet 3   metoprolol succinate (TOPROL-XL) 100 MG 24 hr tablet TAKE 1 & 1/2 (ONE & ONE-HALF) TABLETS BY MOUTH ONCE DAILY 135 tablet 2   nitroGLYCERIN (NITROSTAT) 0.4 MG SL tablet place 1 tablet under the tongue if needed every 5 minutes for chest pain for 3 doses IF NO RELIEF AFTER 3RD DOSE CALL PRESCRIBER OR 911. 25 tablet 2   pantoprazole (PROTONIX) 40 MG tablet Take 1 tablet by mouth once daily 90 tablet 0   tamsulosin (FLOMAX) 0.4 MG CAPS capsule TAKE 1 CAPSULE BY MOUTH ONCE DAILY AFTER SUPPER 90 capsule 0   warfarin (COUMADIN) 5 MG tablet TAKE 1 TABLET BY MOUTH ONCE DAILY EXCEPT 1.5 TABLETS ON TUESDAYS 100 tablet 3   albuterol (VENTOLIN HFA) 108 (90 Base) MCG/ACT inhaler INHALE 2 TO 4 PUFFS BY MOUTH EVERY 4 HOURS AS NEEDED FOR WHEEZING OR SHORTNESS OF BREATH 9 g 0   amoxicillin-clavulanate (AUGMENTIN) 875-125 MG tablet Take 1 tablet by mouth 2 (two) times daily. (Patient not taking: Reported on 05/04/2021) 14 tablet 0   benzonatate (TESSALON) 100 MG capsule Take 1 capsule (100 mg total) by mouth 2 (two) times daily as needed for cough. (Patient not taking: Reported on 05/04/2021) 20 capsule 0   predniSONE (DELTASONE) 20 MG tablet Take 2 tablets (40 mg total) by mouth daily with breakfast. (Patient not taking: Reported on 05/04/2021) 10 tablet 0   No facility-administered medications prior to visit.    No Known Allergies  ROS Review of Systems  Constitutional:  Negative for chills and fever.  HENT:  Negative for congestion and sore throat.   Eyes:  Negative for pain and discharge.  Respiratory:  Positive for cough (chronic), shortness of breath and wheezing.   Cardiovascular:  Negative for chest pain and palpitations.  Gastrointestinal:  Negative for constipation, diarrhea, nausea and vomiting.  Endocrine: Negative for polydipsia and polyuria.  Genitourinary:   Negative for dysuria and hematuria.  Musculoskeletal:  Negative for neck  pain and neck stiffness.  Skin:  Negative for rash.  Neurological:  Negative for dizziness, weakness, numbness and headaches.  Psychiatric/Behavioral:  Negative for agitation and behavioral problems.      Objective:    Physical Exam Vitals reviewed.  Constitutional:      General: He is not in acute distress.    Appearance: He is not diaphoretic.  HENT:     Head: Normocephalic and atraumatic.     Nose: Nose normal.     Mouth/Throat:     Mouth: Mucous membranes are moist.  Eyes:     General: No scleral icterus.    Extraocular Movements: Extraocular movements intact.  Cardiovascular:     Rate and Rhythm: Normal rate and regular rhythm.     Pulses: Normal pulses.     Heart sounds: Normal heart sounds. No murmur heard. Pulmonary:     Breath sounds: Wheezing (diffuse b/l) present. No rales.  Musculoskeletal:     Cervical back: Neck supple. No tenderness.     Right lower leg: Edema (1+) present.     Left lower leg: Edema (1+) present.  Skin:    General: Skin is warm.     Findings: No rash.  Neurological:     General: No focal deficit present.     Mental Status: He is alert and oriented to person, place, and time.  Psychiatric:        Mood and Affect: Mood normal.        Behavior: Behavior normal.    BP (!) 164/68 (BP Location: Right Arm, Cuff Size: Normal)    Pulse 84    Resp (!) 24    Ht 6' (1.829 m)    Wt 195 lb (88.5 kg)    SpO2 95%    BMI 26.45 kg/m  Wt Readings from Last 3 Encounters:  05/04/21 195 lb (88.5 kg)  02/17/21 187 lb 1.3 oz (84.9 kg)  02/16/21 198 lb (89.8 kg)    Lab Results  Component Value Date   TSH 1.03 07/27/2017   Lab Results  Component Value Date   WBC 6.2 02/17/2021   HGB 10.8 (L) 02/17/2021   HCT 32.8 (L) 02/17/2021   MCV 84 02/17/2021   PLT 206 02/17/2021   Lab Results  Component Value Date   NA 143 02/17/2021   K 4.4 02/17/2021   CO2 17 (L) 02/17/2021    GLUCOSE 126 (H) 02/17/2021   BUN 33 (H) 02/17/2021   CREATININE 3.79 (H) 02/17/2021   BILITOT 0.3 02/17/2021   ALKPHOS 125 (H) 02/17/2021   AST 14 02/17/2021   ALT 16 02/17/2021   PROT 6.7 02/17/2021   ALBUMIN 4.0 02/17/2021   CALCIUM 10.1 02/17/2021   ANIONGAP 10 08/24/2020   EGFR 17 (L) 02/17/2021   Lab Results  Component Value Date   CHOL 112 02/17/2021   Lab Results  Component Value Date   HDL 25 (L) 02/17/2021   Lab Results  Component Value Date   LDLCALC 56 02/17/2021   Lab Results  Component Value Date   TRIG 183 (H) 02/17/2021   Lab Results  Component Value Date   CHOLHDL 4.5 02/17/2021   Lab Results  Component Value Date   HGBA1C 5.5 02/17/2021      Assessment & Plan:   Problem List Items Addressed This Visit       Cardiovascular and Mediastinum   Essential hypertension (Chronic)    BP Readings from Last 1 Encounters:  05/04/21 (!) 164/68  Elevated today  likely due to COPD exacerbation, but usually well-controlled with Amlodipine, Metoprolol and Hydralazine - followed by Cardiology and Nephrology Counseled for compliance with the medications Advised DASH diet and moderate exercise/walking as tolerated      Relevant Orders   CBC with Differential/Platelet   Basic Metabolic Panel (BMET)     Respiratory   Acute bronchitis with COPD ([place]) - Primary    Recently quit smoking Had acute bronchitis recently, was slightly better with Azithromycin and Prednisone, but again feels worse SoluMedrol 125 mg given in the office Started Sterapred taper Uncontrolled with Breo now, started Trelegy instead, sample provided Albuterol PRN, refilled - has run out of it      Relevant Medications   predniSONE (STERAPRED UNI-PAK 21 TAB) 10 MG (21) TBPK tablet   albuterol (VENTOLIN HFA) 108 (90 Base) MCG/ACT inhaler   Fluticasone-Umeclidin-Vilant (TRELEGY ELLIPTA) 100-62.5-25 MCG/ACT AEPB   methylPREDNISolone sodium succinate (SOLU-MEDROL) 125 mg/2 mL injection 125  mg   Other Relevant Orders   CBC with Differential/Platelet     Genitourinary   Chronic kidney disease    F/u with Nephrology - Dr Justin Mend - needs a f/u visit Avoid nephrotoxic agents On Calcitriol On iron supplements On Lasix      Relevant Orders   Basic Metabolic Panel (BMET)     Other   Iron deficiency anemia    In the setting of CKD On iron supplements Needs to follow up with Nephrology      Other Visit Diagnoses     Screening for lung cancer       Relevant Orders   CT CHEST LUNG CA SCREEN LOW DOSE W/O CM       Meds ordered this encounter  Medications   predniSONE (STERAPRED UNI-PAK 21 TAB) 10 MG (21) TBPK tablet    Sig: Take as package instructions.    Dispense:  1 each    Refill:  0   albuterol (VENTOLIN HFA) 108 (90 Base) MCG/ACT inhaler    Sig: Inhale 1-2 puffs into the lungs every 6 (six) hours as needed for wheezing or shortness of breath.    Dispense:  8 g    Refill:  11   Fluticasone-Umeclidin-Vilant (TRELEGY ELLIPTA) 100-62.5-25 MCG/ACT AEPB    Sig: Inhale 1 puff into the lungs daily.    Dispense:  1 each    Refill:  11   methylPREDNISolone sodium succinate (SOLU-MEDROL) 125 mg/2 mL injection 125 mg    Follow-up: Return in about 3 months (around 08/02/2021) for HTN and COPD.    Lindell Spar, MD

## 2021-05-04 NOTE — Assessment & Plan Note (Signed)
F/u with Nephrology - Dr Justin Mend - needs a f/u visit Avoid nephrotoxic agents On Calcitriol On iron supplements On Lasix

## 2021-05-04 NOTE — Assessment & Plan Note (Signed)
In the setting of CKD On iron supplements Needs to follow up with Nephrology

## 2021-05-04 NOTE — Assessment & Plan Note (Signed)
Recently quit smoking Had acute bronchitis recently, was slightly better with Azithromycin and Prednisone, but again feels worse SoluMedrol 125 mg given in the office Started Sterapred taper Uncontrolled with Memory Dance now, started Trelegy instead, sample provided Albuterol PRN, refilled - has run out of it

## 2021-05-04 NOTE — Assessment & Plan Note (Signed)
BP Readings from Last 1 Encounters:  05/04/21 (!) 164/68   Elevated today likely due to COPD exacerbation, but usually well-controlled with Amlodipine, Metoprolol and Hydralazine - followed by Cardiology and Nephrology Counseled for compliance with the medications Advised DASH diet and moderate exercise/walking as tolerated

## 2021-05-05 ENCOUNTER — Telehealth: Payer: Self-pay

## 2021-05-05 NOTE — Telephone Encounter (Signed)
Patient spouse called needs to know if he can take iron pill OTC.  Call back # [phone].

## 2021-05-06 NOTE — Telephone Encounter (Signed)
Vaughan Basta pt wife notified verbal understanding

## 2021-05-06 NOTE — Telephone Encounter (Signed)
LVM for pt to call the office.

## 2021-05-08 DIAGNOSIS — D631 Anemia in chronic kidney disease: Secondary | ICD-10-CM | POA: Diagnosis not present

## 2021-05-08 DIAGNOSIS — I739 Peripheral vascular disease, unspecified: Secondary | ICD-10-CM | POA: Diagnosis not present

## 2021-05-08 DIAGNOSIS — N189 Chronic kidney disease, unspecified: Secondary | ICD-10-CM | POA: Diagnosis not present

## 2021-05-08 DIAGNOSIS — K219 Gastro-esophageal reflux disease without esophagitis: Secondary | ICD-10-CM | POA: Diagnosis not present

## 2021-05-08 DIAGNOSIS — I129 Hypertensive chronic kidney disease with stage 1 through stage 4 chronic kidney disease, or unspecified chronic kidney disease: Secondary | ICD-10-CM | POA: Diagnosis not present

## 2021-05-08 DIAGNOSIS — I714 Abdominal aortic aneurysm, without rupture, unspecified: Secondary | ICD-10-CM | POA: Diagnosis not present

## 2021-05-08 DIAGNOSIS — N184 Chronic kidney disease, stage 4 (severe): Secondary | ICD-10-CM | POA: Diagnosis not present

## 2021-05-08 DIAGNOSIS — F17200 Nicotine dependence, unspecified, uncomplicated: Secondary | ICD-10-CM | POA: Diagnosis not present

## 2021-05-08 DIAGNOSIS — N2581 Secondary hyperparathyroidism of renal origin: Secondary | ICD-10-CM | POA: Diagnosis not present

## 2021-05-08 DIAGNOSIS — E785 Hyperlipidemia, unspecified: Secondary | ICD-10-CM | POA: Diagnosis not present

## 2021-05-08 DIAGNOSIS — I509 Heart failure, unspecified: Secondary | ICD-10-CM | POA: Diagnosis not present

## 2021-05-08 DIAGNOSIS — I4891 Unspecified atrial fibrillation: Secondary | ICD-10-CM | POA: Diagnosis not present

## 2021-05-11 ENCOUNTER — Other Ambulatory Visit: Payer: Self-pay | Admitting: Cardiology

## 2021-05-12 ENCOUNTER — Other Ambulatory Visit: Payer: Self-pay

## 2021-05-12 ENCOUNTER — Ambulatory Visit (HOSPITAL_COMMUNITY)
Admission: RE | Admit: 2021-05-12 | Discharge: 2021-05-12 | Disposition: A | Discharge status: Home / Self Care | Payer: Commercial Managed Care - HMO | Source: Ambulatory Visit | Attending: Internal Medicine | Admitting: Internal Medicine

## 2021-05-12 DIAGNOSIS — I2510 Atherosclerotic heart disease of native coronary artery without angina pectoris: Secondary | ICD-10-CM | POA: Insufficient documentation

## 2021-05-12 DIAGNOSIS — I517 Cardiomegaly: Secondary | ICD-10-CM | POA: Diagnosis not present

## 2021-05-12 DIAGNOSIS — I700 Atherosclerosis of aorta: Secondary | ICD-10-CM | POA: Diagnosis not present

## 2021-05-12 DIAGNOSIS — Z122 Encounter for screening for malignant neoplasm of respiratory organs: Secondary | ICD-10-CM | POA: Diagnosis not present

## 2021-05-12 DIAGNOSIS — J432 Centrilobular emphysema: Secondary | ICD-10-CM | POA: Insufficient documentation

## 2021-05-12 DIAGNOSIS — F17210 Nicotine dependence, cigarettes, uncomplicated: Secondary | ICD-10-CM | POA: Diagnosis not present

## 2021-05-12 DIAGNOSIS — R918 Other nonspecific abnormal finding of lung field: Secondary | ICD-10-CM | POA: Diagnosis not present

## 2021-05-12 DIAGNOSIS — Z87891 Personal history of nicotine dependence: Secondary | ICD-10-CM | POA: Insufficient documentation

## 2021-05-12 DIAGNOSIS — J438 Other emphysema: Secondary | ICD-10-CM | POA: Insufficient documentation

## 2021-05-13 ENCOUNTER — Ambulatory Visit: Payer: Medicare Other | Admitting: Internal Medicine

## 2021-05-15 LAB — CBC WITH DIFFERENTIAL/PLATELET
Basophils Absolute: 0 10*3/uL (ref 0.0–0.2)
Basos: 0 %
EOS (ABSOLUTE): 0.1 10*3/uL (ref 0.0–0.4)
Eos: 2 %
Hematocrit: 30.4 % — ABNORMAL LOW (ref 37.5–51.0)
Hemoglobin: 9.8 g/dL — ABNORMAL LOW (ref 13.0–17.7)
Immature Grans (Abs): 0 10*3/uL (ref 0.0–0.1)
Immature Granulocytes: 1 %
Lymphocytes Absolute: 0.7 10*3/uL (ref 0.7–3.1)
Lymphs: 11 %
MCH: 28.4 pg (ref 26.6–33.0)
MCHC: 32.2 g/dL (ref 31.5–35.7)
MCV: 88 fL (ref 79–97)
Monocytes Absolute: 0.5 10*3/uL (ref 0.1–0.9)
Monocytes: 9 %
Neutrophils Absolute: 4.7 10*3/uL (ref 1.4–7.0)
Neutrophils: 77 %
Platelets: 151 10*3/uL (ref 150–450)
RBC: 3.45 x10E6/uL — ABNORMAL LOW (ref 4.14–5.80)
RDW: 16.2 % — ABNORMAL HIGH (ref 11.6–15.4)
WBC: 6.1 10*3/uL (ref 3.4–10.8)

## 2021-05-15 LAB — BASIC METABOLIC PANEL
BUN/Creatinine Ratio: 9 — ABNORMAL LOW (ref 10–24)
BUN: 29 mg/dL — ABNORMAL HIGH (ref 8–27)
CO2: 15 mmol/L — ABNORMAL LOW (ref 20–29)
Calcium: 9.1 mg/dL (ref 8.6–10.2)
Chloride: 109 mmol/L — ABNORMAL HIGH (ref 96–106)
Creatinine, Ser: 3.4 mg/dL — ABNORMAL HIGH (ref 0.76–1.27)
Glucose: 155 mg/dL — ABNORMAL HIGH (ref 70–99)
Potassium: 3.7 mmol/L (ref 3.5–5.2)
Sodium: 142 mmol/L (ref 134–144)
eGFR: 19 mL/min/{1.73_m2} — ABNORMAL LOW (ref >59)

## 2021-05-18 ENCOUNTER — Other Ambulatory Visit: Payer: Self-pay | Admitting: Cardiology

## 2021-05-18 ENCOUNTER — Other Ambulatory Visit: Payer: Self-pay | Admitting: Internal Medicine

## 2021-05-18 DIAGNOSIS — J449 Chronic obstructive pulmonary disease, unspecified: Secondary | ICD-10-CM

## 2021-05-18 NOTE — Telephone Encounter (Signed)
Warfarin refill: LOV 12/17/20  Zandra Abts MD Last INR  04/13/21  INR 1.9 Refill approved

## 2021-05-20 ENCOUNTER — Telehealth: Payer: Self-pay | Admitting: Internal Medicine

## 2021-05-20 ENCOUNTER — Encounter (HOSPITAL_COMMUNITY)
Admission: RE | Admit: 2021-05-20 | Discharge: 2021-05-20 | Disposition: A | Discharge status: Home / Self Care | Payer: Medicare Other | Source: Ambulatory Visit | Attending: Internal Medicine | Admitting: Internal Medicine

## 2021-05-20 DIAGNOSIS — D631 Anemia in chronic kidney disease: Secondary | ICD-10-CM | POA: Insufficient documentation

## 2021-05-20 MED ORDER — SODIUM CHLORIDE 0.9 % IV SOLN: Freq: Once | INTRAVENOUS | Status: AC

## 2021-05-20 MED ORDER — SODIUM CHLORIDE 0.9 % IV SOLN
510.0000 mg | INTRAVENOUS | Status: DC
  Administered 2021-05-20: 510 mg via INTRAVENOUS
  Filled 2021-05-20: qty 510

## 2021-05-20 NOTE — Telephone Encounter (Signed)
error 

## 2021-05-21 ENCOUNTER — Encounter: Payer: Self-pay | Admitting: Internal Medicine

## 2021-05-21 ENCOUNTER — Other Ambulatory Visit: Payer: Self-pay

## 2021-05-21 ENCOUNTER — Ambulatory Visit (INDEPENDENT_AMBULATORY_CARE_PROVIDER_SITE_OTHER): Payer: Medicare Other | Admitting: Internal Medicine

## 2021-05-21 ENCOUNTER — Other Ambulatory Visit: Payer: Self-pay | Admitting: Internal Medicine

## 2021-05-21 VITALS — BP 158/62 | HR 88 | Resp 20 | Ht 72.0 in | Wt 208.0 lb

## 2021-05-21 DIAGNOSIS — I10 Essential (primary) hypertension: Secondary | ICD-10-CM

## 2021-05-21 DIAGNOSIS — I5042 Chronic combined systolic (congestive) and diastolic (congestive) heart failure: Secondary | ICD-10-CM | POA: Diagnosis not present

## 2021-05-21 NOTE — Assessment & Plan Note (Addendum)
BP Readings from Last 1 Encounters:  05/21/21 (!) 158/62   Elevated today likely due to CHF exacerbation - volume overload, but usually well-controlled with Amlodipine, Metoprolol and Hydralazine - followed by Cardiology and Nephrology Counseled for compliance with the medications Advised DASH diet and moderate exercise/walking as tolerated

## 2021-05-21 NOTE — Patient Instructions (Signed)
Please take Furosemide twice daily for 2 days.  Please try leg elevation as tolerated.  Please follow low salt diet and ambulate as tolerated.  If your leg swelling does not improve, please contact Cardiology office.

## 2021-05-21 NOTE — Progress Notes (Signed)
Acute Office Visit  Subjective:    Patient ID: [name] [name], male    DOB: November 20, 1950, 71 y.o.   MRN: [id number]  Chief Complaint  Patient presents with   Leg Swelling    Legs and feet have been swelling    HPI Patient is in today for complaint of bilateral leg swelling for the last 2 months, which is worse for the last 1 week.  He has a history of CHF and CKD, but has adequate urine output.  Denies any recent change in medications.  He takes Lasix 40 mg and 20 mg on alternate days.  Denies skipping any doses recently.  He also has noticed recent weight gain in the last 1 week.  He denies any recent worsening of dyspnea, chest pain or palpitations.  Past Medical History:  Diagnosis Date   AAA (abdominal aortic aneurysm)    a. s/p repair 06/2015 with left renal artery bypass at that time, post-op course c/b renal failure requiring dialysis, C dif.   Anemia    Arteriosclerotic cardiovascular disease (ASCVD)    a. AMI in 2000 treated at [facility]. b. cath in 12/2006->  Chronic total obstruction of the RCA;  drug-eluting stent placed in OM1, LVEF abnormal.   Calculus of gallbladder with acute cholecystitis without obstruction    Cerebrovascular disease 2002   carotid stent   Chronic anticoagulation    Chronic combined systolic and diastolic CHF (congestive heart failure) (HCC)    CKD (chronic kidney disease), stage IV (HCC)    COPD (chronic obstructive pulmonary disease) (HCC)    GERD (gastroesophageal reflux disease)    Hyperlipidemia    Hypertension    Myocardial infarction ([place]) 10 yrs ago   Nephrolithiasis    Permanent atrial fibrillation ([place])    Pseudoaneurysm of aorta ([place]) 06/09/2015   PVD (peripheral vascular disease) ([place])    Ct angiogram in 2009 revealed stable disease with 80% celiac stenosis,50% right renal artery ,ASCVD with ulceration in the abdominal aortashe   Testicular carcinoma ([place]) 1990   right orchiectomy   Tobacco abuse, in remission    20 pack years; quit  in 2009    Past Surgical History:  Procedure Laterality Date   ABDOMINAL AORTIC ANEURYSM REPAIR N/A 06/09/2015   Procedure: ANEURYSM ABDOMINAL AORTIC REPAIR;  Surgeon: Elam Dutch, MD;  Location: [place];  Service: Vascular;  Laterality: N/A;   AORTIC ENDARTERECETOMY N/A 06/09/2015   Procedure: AORTIC ENDARTERECETOMY;  Surgeon: Elam Dutch, MD;  Location: [place];  Service: Vascular;  Laterality: N/A;   AORTIC/RENAL BYPASS Left 06/09/2015   Procedure: LEFT RENAL Artery BYPASS;  Surgeon: Elam Dutch, MD;  Location: [place];  Service: Vascular;  Laterality: Left;   APPENDECTOMY  2004   CHOLECYSTECTOMY N/A 09/18/2018   Procedure: LAPAROSCOPIC CHOLECYSTECTOMY;  Surgeon: Aviva Signs, MD;  [facility];  Service: General;  Laterality: N/A;   COLONOSCOPY  06/17/2011   INCOMPLETE, PREP POOR. Procedure: COLONOSCOPY;  Surgeon: Daneil Dolin, MD;  [facility];  Service: Endoscopy;  Laterality: N/A;  10:00   COLONOSCOPY  07/15/2011   WUJ:[id number] rectal and colon polyps   COLONOSCOPY N/A 05/22/2015   [id number] colonic and rectal polyps. tubular adenomas.repeat TCS 05/2018   ESOPHAGEAL DILATION N/A 05/22/2015   Procedure: ESOPHAGEAL DILATION;  Surgeon: Daneil Dolin, MD;  [facility];  Service: Endoscopy;  Laterality: N/A;   ESOPHAGOGASTRODUODENOSCOPY  06/17/2011   severe erosive/ulcerative reflux esophagitis, soft noncritical stricture dilatied, small hh, antral  erosion    ESOPHAGOGASTRODUODENOSCOPY N/A 05/22/2015   RMR: 2 cm HH otherwise normal. s/p empirical dilation.   INSERTION OF DIALYSIS CATHETER Left 06/26/2015   Procedure: INSERTION OF DIALYSIS CATHETER;  Surgeon: Angelia Mould, MD;  Location: [place];  Service: Vascular;  Laterality: Left;   PERIPHERAL VASCULAR CATHETERIZATION N/A 05/28/2015   Procedure: Abdominal Aortogram;  Surgeon: Serafina Mitchell, MD;  Location: [place] [facility];  Service: Cardiovascular;  Laterality: N/A;   testicular cancer  1990    right orchiectomy    Family History  Problem Relation Age of Onset   Colon cancer Father 42       deceased   Prostate cancer Father    Heart disease Mother    Liver disease Neg Hx    Anesthesia problems Neg Hx    Hypotension Neg Hx    Malignant hyperthermia Neg Hx    Pseudochol deficiency Neg Hx     Social History   Socioeconomic History   Marital status: Married    Spouse name: Not on file   Number of children: 2   Years of education: Not on file   Highest education level: Not on file  Occupational History   Occupation: disabled    Employer: UNEMPLOYED  Tobacco Use   Smoking status: Former    Packs/day: 0.50    Years: 40.00    Pack years: 20.00    Types: Cigarettes    Quit date: 03/02/2019    Years since quitting: 2.2   Smokeless tobacco: Never   Tobacco comments:    1/2 pack daily  Vaping Use   Vaping Use: Never used  Substance and Sexual Activity   Alcohol use: No    Alcohol/week: 0.0 standard drinks   Drug use: No   Sexual activity: Yes    Birth control/protection: None  Other Topics Concern   Not on file  Social History Narrative   Not on file   Social Determinants of Health   Financial Resource Strain: Low Risk    Difficulty of Paying Living Expenses: Not hard at all  Food Insecurity: No Food Insecurity   Worried About Charity fundraiser in the Last Year: Never true   Ran Out of Food in the Last Year: Never true  Transportation Needs: No Transportation Needs   Lack of Transportation (Medical): No   Lack of Transportation (Non-Medical): No  Physical Activity: Insufficiently Active   Days of Exercise per Week: 3 days   Minutes of Exercise per Session: 30 min  Stress: No Stress Concern Present   Feeling of Stress : Not at all  Social Connections: Socially Integrated   Frequency of Communication with Friends and Family: More than three times a week   Frequency of Social Gatherings with Friends and Family: More than three times a week    Attends Religious Services: More than 4 times per year   Active Member of [employer] or Organizations: Yes   Attends Music therapist: More than 4 times per year   Marital Status: Married  Human resources officer Violence: Not At Risk   Fear of Current or Ex-Partner: No   Emotionally Abused: No   Physically Abused: No   Sexually Abused: No    Outpatient Medications Prior to Visit  Medication Sig Dispense Refill   albuterol (PROVENTIL) (2.5 MG/3ML) 0.083% nebulizer solution USE 1 VIAL IN NEBULIZER EVERY 4 HOURS AS NEEDED FOR WHEEZING OR SHORTNESS OF BREATH 180 mL 0   albuterol (VENTOLIN HFA) 108 (  90 Base) MCG/ACT inhaler Inhale 1-2 puffs into the lungs every 6 (six) hours as needed for wheezing or shortness of breath. 8 g 11   amLODipine (NORVASC) 10 MG tablet Take 1 tablet (10 mg total) by mouth daily. 90 tablet 2   atorvastatin (LIPITOR) 20 MG tablet Take 1 tablet (20 mg total) by mouth daily. 30 tablet 3   calcitRIOL (ROCALTROL) 0.25 MCG capsule Take 0.25 mcg by mouth daily.      Ferrous Sulfate (IRON) 325 (65 Fe) MG TABS TAKE 1 TABLET BY MOUTH THREE TIMES DAILY 180 tablet 0   fluticasone (FLONASE) 50 MCG/ACT nasal spray Place 1 spray into both nostrils daily as needed for allergies or rhinitis.     fluticasone furoate-vilanterol (BREO ELLIPTA) 100-25 MCG/INH AEPB Inhale 1 puff into the lungs daily. 1 each 11   Fluticasone-Umeclidin-Vilant (TRELEGY ELLIPTA) 100-62.5-25 MCG/ACT AEPB Inhale 1 puff into the lungs daily. 1 each 11   furosemide (LASIX) 40 MG tablet TAKE 1 TABLET BY MOUTH ALTERNATING WITH ONE HALF TABLET EVERY OTHER DAY 45 tablet 0   hydrALAZINE (APRESOLINE) 50 MG tablet Take 1.5 tablets (75 mg total) by mouth 3 (three) times daily. 405 tablet 3   metoprolol succinate (TOPROL-XL) 100 MG 24 hr tablet TAKE 1 & 1/2 (ONE & ONE-HALF) TABLETS BY MOUTH ONCE DAILY 135 tablet 0   nitroGLYCERIN (NITROSTAT) 0.4 MG SL tablet place 1 tablet under the tongue if needed every 5 minutes for  chest pain for 3 doses IF NO RELIEF AFTER 3RD DOSE CALL PRESCRIBER OR 911. 25 tablet 2   pantoprazole (PROTONIX) 40 MG tablet Take 1 tablet by mouth once daily 90 tablet 0   predniSONE (STERAPRED UNI-PAK 21 TAB) 10 MG (21) TBPK tablet Take as package instructions. 1 each 0   warfarin (COUMADIN) 5 MG tablet TAKE 1 TABLET BY MOUTH ONCE DAILY EXCEPT  ONE  AND  ONE-HALF  TABLETS  ON  TUESDAYS 100 tablet 1   tamsulosin (FLOMAX) 0.4 MG CAPS capsule TAKE 1 CAPSULE BY MOUTH ONCE DAILY AFTER SUPPER 90 capsule 0   No facility-administered medications prior to visit.    No Known Allergies  Review of Systems  Constitutional:  Negative for chills and fever.  HENT:  Negative for congestion and sore throat.   Eyes:  Negative for pain and discharge.  Respiratory:  Positive for cough (chronic) and shortness of breath. Negative for wheezing.   Cardiovascular:  Positive for leg swelling. Negative for chest pain and palpitations.  Gastrointestinal:  Negative for constipation, diarrhea, nausea and vomiting.  Endocrine: Negative for polydipsia and polyuria.  Genitourinary:  Negative for dysuria and hematuria.  Musculoskeletal:  Negative for neck pain and neck stiffness.  Skin:  Negative for rash.  Neurological:  Negative for dizziness, weakness, numbness and headaches.  Psychiatric/Behavioral:  Negative for agitation and behavioral problems.       Objective:    Physical Exam Vitals reviewed.  Constitutional:      General: He is not in acute distress.    Appearance: He is not diaphoretic.  HENT:     Head: Normocephalic and atraumatic.     Nose: Nose normal.     Mouth/Throat:     Mouth: Mucous membranes are moist.  Eyes:     General: No scleral icterus.    Extraocular Movements: Extraocular movements intact.  Cardiovascular:     Rate and Rhythm: Normal rate and regular rhythm.     Pulses: Normal pulses.     Heart sounds: Normal  heart sounds. No murmur heard. Pulmonary:     Breath sounds: No  wheezing or rales.  Musculoskeletal:     Cervical back: Neck supple. No tenderness.     Right lower leg: Edema (2+) present.     Left lower leg: Edema (2+) present.  Skin:    General: Skin is warm.     Findings: No rash.  Neurological:     General: No focal deficit present.     Mental Status: He is alert and oriented to person, place, and time.  Psychiatric:        Mood and Affect: Mood normal.        Behavior: Behavior normal.    BP (!) 158/62 (BP Location: Right Arm, Cuff Size: Normal)    Pulse 88    Resp 20    Ht 6' (1.829 m)    Wt 208 lb (94.3 kg)    SpO2 99%    BMI 28.21 kg/m  Wt Readings from Last 3 Encounters:  05/21/21 208 lb (94.3 kg)  05/04/21 195 lb (88.5 kg)  02/17/21 187 lb 1.3 oz (84.9 kg)        Assessment & Plan:   Problem List Items Addressed This Visit       Cardiovascular and Mediastinum   Essential hypertension (Chronic)    BP Readings from Last 1 Encounters:  05/21/21 (!) 158/62  Elevated today likely due to CHF exacerbation - volume overload, but usually well-controlled with Amlodipine, Metoprolol and Hydralazine - followed by Cardiology and Nephrology Counseled for compliance with the medications Advised DASH diet and moderate exercise/walking as tolerated      Chronic combined systolic and diastolic CHF (congestive heart failure) ([place]) - Primary    Echo in 08/2019 showed EF of 50-55%. Has leg swelling and recent weight gain, could be CHF exacerbation Advised to take Lasix 40 mg BID for 2 days, and then continue current regimen Needs to follow low salt diet Continue daily weight checks If more than 3 lb weight gain in 1 day or more than 5 lb in 1 week, needs to contact Cardiology        No orders of the defined types were placed in this encounter.    Lindell Spar, MD

## 2021-05-21 NOTE — Assessment & Plan Note (Signed)
Echo in 08/2019 showed EF of 50-55%. Has leg swelling and recent weight gain, could be CHF exacerbation Advised to take Lasix 40 mg BID for 2 days, and then continue current regimen Needs to follow low salt diet Continue daily weight checks If more than 3 lb weight gain in 1 day or more than 5 lb in 1 week, needs to contact Cardiology

## 2021-05-25 ENCOUNTER — Other Ambulatory Visit: Payer: Self-pay

## 2021-05-25 ENCOUNTER — Ambulatory Visit (INDEPENDENT_AMBULATORY_CARE_PROVIDER_SITE_OTHER): Payer: Medicare Other | Admitting: *Deleted

## 2021-05-25 DIAGNOSIS — I4891 Unspecified atrial fibrillation: Secondary | ICD-10-CM

## 2021-05-25 DIAGNOSIS — Z5181 Encounter for therapeutic drug level monitoring: Secondary | ICD-10-CM | POA: Diagnosis not present

## 2021-05-25 LAB — POCT INR: INR: 3.2 — AB (ref 2.0–3.0)

## 2021-05-25 NOTE — Patient Instructions (Signed)
Hold warfarin tonight then resume 1 tablet daily except 1/2 tablet on Sundays Recheck in 5 weeks

## 2021-05-27 ENCOUNTER — Encounter (HOSPITAL_COMMUNITY)
Admission: RE | Admit: 2021-05-27 | Discharge: 2021-05-27 | Disposition: A | Discharge status: Home / Self Care | Payer: Medicare Other | Source: Ambulatory Visit | Attending: Internal Medicine | Admitting: Internal Medicine

## 2021-05-27 ENCOUNTER — Encounter (HOSPITAL_COMMUNITY): Payer: Self-pay

## 2021-05-27 DIAGNOSIS — I129 Hypertensive chronic kidney disease with stage 1 through stage 4 chronic kidney disease, or unspecified chronic kidney disease: Secondary | ICD-10-CM | POA: Diagnosis not present

## 2021-05-27 DIAGNOSIS — D631 Anemia in chronic kidney disease: Secondary | ICD-10-CM | POA: Diagnosis not present

## 2021-05-27 MED ORDER — SODIUM CHLORIDE 0.9 % IV SOLN
510.0000 mg | Freq: Once | INTRAVENOUS | Status: AC
  Administered 2021-05-27: 13:00:00 510 mg via INTRAVENOUS
  Filled 2021-05-27: qty 510

## 2021-05-27 MED ORDER — SODIUM CHLORIDE 0.9 % IV SOLN
Freq: Once | INTRAVENOUS | Status: AC
  Administered 2021-05-27: 13:00:00 250 mL via INTRAVENOUS

## 2021-05-31 ENCOUNTER — Other Ambulatory Visit: Payer: Self-pay | Admitting: Internal Medicine

## 2021-05-31 DIAGNOSIS — J449 Chronic obstructive pulmonary disease, unspecified: Secondary | ICD-10-CM

## 2021-06-08 ENCOUNTER — Other Ambulatory Visit: Payer: Self-pay | Admitting: Internal Medicine

## 2021-06-15 ENCOUNTER — Other Ambulatory Visit: Payer: Self-pay | Admitting: Cardiology

## 2021-06-22 ENCOUNTER — Other Ambulatory Visit: Payer: Self-pay | Admitting: Cardiology

## 2021-06-24 ENCOUNTER — Other Ambulatory Visit: Payer: Self-pay | Admitting: *Deleted

## 2021-06-24 DIAGNOSIS — J449 Chronic obstructive pulmonary disease, unspecified: Secondary | ICD-10-CM

## 2021-06-24 MED ORDER — ALBUTEROL SULFATE (2.5 MG/3ML) 0.083% IN NEBU: INHALATION_SOLUTION | RESPIRATORY_TRACT | 0 refills | Status: DC

## 2021-06-29 ENCOUNTER — Other Ambulatory Visit: Payer: Self-pay | Admitting: Cardiology

## 2021-06-29 ENCOUNTER — Other Ambulatory Visit: Payer: Self-pay | Admitting: Internal Medicine

## 2021-06-29 ENCOUNTER — Ambulatory Visit: Payer: Medicare Other | Admitting: Cardiology

## 2021-06-30 ENCOUNTER — Ambulatory Visit: Payer: Medicare Other | Admitting: *Deleted

## 2021-06-30 DIAGNOSIS — I4891 Unspecified atrial fibrillation: Secondary | ICD-10-CM

## 2021-06-30 DIAGNOSIS — Z5181 Encounter for therapeutic drug level monitoring: Secondary | ICD-10-CM | POA: Diagnosis not present

## 2021-06-30 LAB — POCT INR: INR: 1.7 — AB (ref 2.0–3.0)

## 2021-06-30 NOTE — Patient Instructions (Signed)
Take warfarin 2 tablets tonight then resume 1 tablet daily except 1/2 tablet on Sundays Recheck in 3 weeks

## 2021-07-06 ENCOUNTER — Other Ambulatory Visit: Payer: Self-pay | Admitting: Internal Medicine

## 2021-07-08 ENCOUNTER — Ambulatory Visit (INDEPENDENT_AMBULATORY_CARE_PROVIDER_SITE_OTHER): Payer: Medicare Other | Admitting: Cardiology

## 2021-07-08 ENCOUNTER — Encounter: Payer: Self-pay | Admitting: Cardiology

## 2021-07-08 VITALS — BP 148/80 | HR 74 | Ht 72.0 in | Wt 193.4 lb

## 2021-07-08 DIAGNOSIS — I10 Essential (primary) hypertension: Secondary | ICD-10-CM

## 2021-07-08 DIAGNOSIS — I2510 Atherosclerotic heart disease of native coronary artery without angina pectoris: Secondary | ICD-10-CM

## 2021-07-08 DIAGNOSIS — I4891 Unspecified atrial fibrillation: Secondary | ICD-10-CM

## 2021-07-08 DIAGNOSIS — I5033 Acute on chronic diastolic (congestive) heart failure: Secondary | ICD-10-CM | POA: Diagnosis not present

## 2021-07-08 MED ORDER — HYDRALAZINE HCL 100 MG PO TABS: 100.0000 mg | ORAL_TABLET | Freq: Three times a day (TID) | ORAL | 1 refills | Status: DC

## 2021-07-08 NOTE — Patient Instructions (Addendum)
Medication Instructions:  ?Your physician has recommended you make the following change in your medication:  ?Increase hydralazine to 100 mg by mouth three times a day ?Continue all other medications as directed ? ?Labwork: ?Your physician recommends that you return for lab work in:  ?BMET ?BNP ?Magnesium  ? ?Testing/Procedures: ?none ? ?Follow-Up: ?Your physician recommends that you schedule a follow-up appointment in: 2 months ? ?Any Other Special Instructions Will Be Listed Below (If Applicable). ? ?If you need a refill on your cardiac medications before your next appointment, please call your pharmacy. ? ?

## 2021-07-08 NOTE — Progress Notes (Signed)
Clinical Summary [name] [name] is a 71 y.o.male seen today for follow up of the following medical problems.    1. Afib - eliquis was too expensive, changed to coumadin.   - no recent palpitations - no bleeding on coumadin     2. CAD - remote cath at [facility]. History of RCA CTO, received stent to OM1.      - no chest pain.          3. HTN -  compliant with meds       4. PAD - s/p repair of juxtarenal abdominal aortic aneurysm, left renal artery bypass, followed by vascular - from 06/2017 note was to have f/u in 5 years     5. Chronic sysotlic HF/ICM - ehco 05/6107 LVEF 35-40% - Jan 2019 echo LVEF 50%. Abnormal diastolic function - lasix '40mg'$  daily led to [facility], has done well with '40mg'$  alternating days with '20mg'$      -Jan 2023 pcp visit increased leg swelling and weightin gain. Listed weight 208 lbs, 2 weeks prior had been 195 lbs.  - lasix increased, taking '40mg'$  daily.       6. HL - labs followed by pcp        7. CKD IV - followed at [facility]    8. COPD  Past Medical History:  Diagnosis Date   AAA (abdominal aortic aneurysm)    a. s/p repair 06/2015 with left renal artery bypass at that time, post-op course c/b renal failure requiring dialysis, C dif.   Anemia    Arteriosclerotic cardiovascular disease (ASCVD)    a. AMI in 2000 treated at [facility]. b. cath in 12/2006->  Chronic total obstruction of the RCA;  drug-eluting stent placed in OM1, LVEF abnormal.   Calculus of gallbladder with acute cholecystitis without obstruction    Cerebrovascular disease 2002   carotid stent   Chronic anticoagulation    Chronic combined systolic and diastolic CHF (congestive heart failure) ([place])    CKD (chronic kidney disease), stage IV (HCC)    COPD (chronic obstructive pulmonary disease) ([place])    GERD (gastroesophageal reflux disease)    Hyperlipidemia    Hypertension    Myocardial infarction ([place]) 10 yrs ago   Nephrolithiasis    Permanent atrial fibrillation  ([place])    Pseudoaneurysm of aorta ([place]) 06/09/2015   PVD (peripheral vascular disease) ([place])    Ct angiogram in 2009 revealed stable disease with 80% celiac stenosis,50% right renal artery ,ASCVD with ulceration in the abdominal aortashe   Testicular carcinoma ([place]) 1990   right orchiectomy   Tobacco abuse, in remission    20 pack years; quit in 2009     No Known Allergies   Current Outpatient Medications  Medication Sig Dispense Refill   albuterol (PROVENTIL) (2.5 MG/3ML) 0.083% nebulizer solution USE 1 VIAL IN NEBULIZER EVERY 4 HOURS AS NEEDED FOR WHEEZING OR SHORTNESS OF BREATH 180 mL 0   albuterol (VENTOLIN HFA) 108 (90 Base) MCG/ACT inhaler Inhale 1-2 puffs into the lungs every 6 (six) hours as needed for wheezing or shortness of breath. 8 g 11   amLODipine (NORVASC) 10 MG tablet Take 1 tablet by mouth once daily 90 tablet 0   atorvastatin (LIPITOR) 20 MG tablet Take 1 tablet by mouth once daily 90 tablet 3   calcitRIOL (ROCALTROL) 0.25 MCG capsule Take 0.25 mcg by mouth daily.      Ferrous Sulfate (IRON) 325 (65 Fe) MG TABS TAKE 1 TABLET BY MOUTH THREE TIMES  DAILY 180 tablet 0   fluticasone (FLONASE) 50 MCG/ACT nasal spray Place 1 spray into both nostrils daily as needed for allergies or rhinitis.     fluticasone furoate-vilanterol (BREO ELLIPTA) 100-25 MCG/INH AEPB Inhale 1 puff into the lungs daily. 1 each 11   Fluticasone-Umeclidin-Vilant (TRELEGY ELLIPTA) 100-62.5-25 MCG/ACT AEPB Inhale 1 puff into the lungs daily. 1 each 11   furosemide (LASIX) 40 MG tablet TAKE 1 TABLET ALTERNATING WITH ONE-HALF TABLET BY MOUTH EVERY OTHER DAY 45 tablet 1   hydrALAZINE (APRESOLINE) 50 MG tablet Take 1.5 tablets (75 mg total) by mouth 3 (three) times daily. 405 tablet 3   metoprolol succinate (TOPROL-XL) 100 MG 24 hr tablet TAKE 1 & 1/2 (ONE & ONE-HALF) TABLETS BY MOUTH ONCE DAILY 135 tablet 0   nitroGLYCERIN (NITROSTAT) 0.4 MG SL tablet place 1 tablet under the tongue if needed every 5 minutes  for chest pain for 3 doses IF NO RELIEF AFTER 3RD DOSE CALL PRESCRIBER OR 911. 25 tablet 2   pantoprazole (PROTONIX) 40 MG tablet Take 1 tablet by mouth once daily 90 tablet 0   predniSONE (STERAPRED UNI-PAK 21 TAB) 10 MG (21) TBPK tablet Take as package instructions. 1 each 0   tamsulosin (FLOMAX) 0.4 MG CAPS capsule TAKE 1 CAPSULE BY MOUTH ONCE DAILY AFTER SUPPER 90 capsule 0   warfarin (COUMADIN) 5 MG tablet TAKE 1 TABLET BY MOUTH ONCE DAILY EXCEPT  ONE  AND  ONE-HALF  TABLETS  ON  TUESDAYS 100 tablet 1   No current facility-administered medications for this visit.     Past Surgical History:  Procedure Laterality Date   ABDOMINAL AORTIC ANEURYSM REPAIR N/A 06/09/2015   Procedure: ANEURYSM ABDOMINAL AORTIC REPAIR;  Surgeon: Elam Dutch, MD;  Location: [place];  Service: Vascular;  Laterality: N/A;   AORTIC ENDARTERECETOMY N/A 06/09/2015   Procedure: AORTIC ENDARTERECETOMY;  Surgeon: Elam Dutch, MD;  Location: [place];  Service: Vascular;  Laterality: N/A;   AORTIC/RENAL BYPASS Left 06/09/2015   Procedure: LEFT RENAL Artery BYPASS;  Surgeon: Elam Dutch, MD;  Location: [place];  Service: Vascular;  Laterality: Left;   APPENDECTOMY  2004   CHOLECYSTECTOMY N/A 09/18/2018   Procedure: LAPAROSCOPIC CHOLECYSTECTOMY;  Surgeon: Aviva Signs, MD;  [facility];  Service: General;  Laterality: N/A;   COLONOSCOPY  06/17/2011   INCOMPLETE, PREP POOR. Procedure: COLONOSCOPY;  Surgeon: Daneil Dolin, MD;  [facility];  Service: Endoscopy;  Laterality: N/A;  10:00   COLONOSCOPY  07/15/2011   [id number] rectal and colon polyps   COLONOSCOPY N/A 05/22/2015   [id number] colonic and rectal polyps. tubular adenomas.repeat TCS 05/2018   ESOPHAGEAL DILATION N/A 05/22/2015   Procedure: ESOPHAGEAL DILATION;  Surgeon: Daneil Dolin, MD;  [facility];  Service: Endoscopy;  Laterality: N/A;   ESOPHAGOGASTRODUODENOSCOPY  06/17/2011   severe erosive/ulcerative reflux esophagitis,  soft noncritical stricture dilatied, small hh, antral erosion    ESOPHAGOGASTRODUODENOSCOPY N/A 05/22/2015   RMR: 2 cm HH otherwise normal. s/p empirical dilation.   INSERTION OF DIALYSIS CATHETER Left 06/26/2015   Procedure: INSERTION OF DIALYSIS CATHETER;  Surgeon: Angelia Mould, MD;  Location: [place];  Service: Vascular;  Laterality: Left;   PERIPHERAL VASCULAR CATHETERIZATION N/A 05/28/2015   Procedure: Abdominal Aortogram;  Surgeon: Serafina Mitchell, MD;  Location: [place] [facility];  Service: Cardiovascular;  Laterality: N/A;   testicular cancer  1990   right orchiectomy     No Known Allergies  Family History  Problem Relation Age of Onset   Colon cancer Father 37       deceased   Prostate cancer Father    Heart disease Mother    Liver disease Neg Hx    Anesthesia problems Neg Hx    Hypotension Neg Hx    Malignant hyperthermia Neg Hx    Pseudochol deficiency Neg Hx      Social History Mr. Granade reports that he quit smoking about 2 years ago. His smoking use included cigarettes. He has a 20.00 pack-year smoking history. He has never used smokeless tobacco. Mr. Rakestraw reports no history of alcohol use.   Review of Systems CONSTITUTIONAL: No weight loss, fever, chills, weakness or fatigue.  HEENT: Eyes: No visual loss, blurred vision, double vision or yellow sclerae.No hearing loss, sneezing, congestion, runny nose or sore throat.  SKIN: No rash or itching.  CARDIOVASCULAR: per hpi RESPIRATORY: per hpi GASTROINTESTINAL: No anorexia, nausea, vomiting or diarrhea. No abdominal pain or blood.  GENITOURINARY: No burning on urination, no polyuria NEUROLOGICAL: No headache, dizziness, syncope, paralysis, ataxia, numbness or tingling in the extremities. No change in bowel or bladder control.  MUSCULOSKELETAL: No muscle, back pain, joint pain or stiffness.  LYMPHATICS: No enlarged nodes. No history of splenectomy.  PSYCHIATRIC: No history of depression or anxiety.   ENDOCRINOLOGIC: No reports of sweating, cold or heat intolerance. No polyuria or polydipsia.  [name]   Physical Examination Today's Vitals   07/08/21 1430  BP: (!) 148/80  Pulse: 74  SpO2: 98%  Weight: 193 lb 6.4 oz (87.7 kg)  Height: 6' (1.829 m)   Body mass index is 26.23 kg/m.  Gen: resting comfortably, no acute distress HEENT: no scleral icterus, pupils equal round and reactive, no palptable cervical adenopathy,  CV: irreg, no m/r/ gno jvd Resp: Clear to auscultation bilaterally GI: abdomen is soft, non-tender, non-distended, normal bowel sounds, no hepatosplenomegaly MSK: extremities are warm, 1+ bilateral LE edema Skin: warm, no rash Neuro:  no focal deficits Psych: appropriate affect   Diagnostic Studies  06/2015 echo - Left ventricle: The cavity size was normal. Systolic function was   moderately reduced. The estimated ejection fraction was in the   range of 35% to 40%. - Pulmonary arteries: PA peak pressure: 33 mm Hg (S). - Inferior vena cava: The vessel was dilated. The respirophasic   diameter changes were blunted (< 50%). This is a nonspecific   finding during positive pressure ventilation.   Impressions:   - Technically limited study due to poor echo windows and   tachycardia. Consider TEE if more accurate evaluation is   necessary.     Jan 2019 echo  Study Conclusions   - Left ventricle: The cavity size was normal. Wall thickness was   increased in a pattern of mild LVH. Systolic function was at the   lower limits of normal. The estimated ejection fraction was 50%.   Diffuse hypokinesis. The study was not technically sufficient to   allow evaluation of LV diastolic dysfunction due to atrial   fibrillation. Doppler parameters are consistent with high   ventricular filling pressure. - Aortic valve: Trileaflet; mildly thickened leaflets. - Mitral valve: There was mild regurgitation. - Left atrium: The atrium was moderately dilated. - Right atrium: The  atrium was moderately dilated.   Assessment and Plan   1. Afib -  NOACs too expensive, continue coumadin - no symptoms, continue current meds   2. CAD - no symptoms, continue current meds  3. HTN -above goal, increase hydralazine to '100mg'$  tid.        4.Acute on chronic diastolic HF - weight was up to 208 at pcp's office, back down to 193 lbs today. Some ongoing edema - obtain bmet/mg/bnp, pending labs may increase lasix further as still has some extra fluid.   F/u 59month    JArnoldo Lenis M.D.

## 2021-07-15 DIAGNOSIS — E785 Hyperlipidemia, unspecified: Secondary | ICD-10-CM | POA: Diagnosis not present

## 2021-07-15 DIAGNOSIS — I509 Heart failure, unspecified: Secondary | ICD-10-CM | POA: Diagnosis not present

## 2021-07-15 DIAGNOSIS — I714 Abdominal aortic aneurysm, without rupture, unspecified: Secondary | ICD-10-CM | POA: Diagnosis not present

## 2021-07-15 DIAGNOSIS — I739 Peripheral vascular disease, unspecified: Secondary | ICD-10-CM | POA: Diagnosis not present

## 2021-07-15 DIAGNOSIS — I129 Hypertensive chronic kidney disease with stage 1 through stage 4 chronic kidney disease, or unspecified chronic kidney disease: Secondary | ICD-10-CM | POA: Diagnosis not present

## 2021-07-15 DIAGNOSIS — N2581 Secondary hyperparathyroidism of renal origin: Secondary | ICD-10-CM | POA: Diagnosis not present

## 2021-07-15 DIAGNOSIS — F17200 Nicotine dependence, unspecified, uncomplicated: Secondary | ICD-10-CM | POA: Diagnosis not present

## 2021-07-15 DIAGNOSIS — I4891 Unspecified atrial fibrillation: Secondary | ICD-10-CM | POA: Diagnosis not present

## 2021-07-15 DIAGNOSIS — N184 Chronic kidney disease, stage 4 (severe): Secondary | ICD-10-CM | POA: Diagnosis not present

## 2021-07-15 DIAGNOSIS — K219 Gastro-esophageal reflux disease without esophagitis: Secondary | ICD-10-CM | POA: Diagnosis not present

## 2021-07-15 DIAGNOSIS — D631 Anemia in chronic kidney disease: Secondary | ICD-10-CM | POA: Diagnosis not present

## 2021-07-20 ENCOUNTER — Ambulatory Visit: Payer: Medicare Other | Admitting: Internal Medicine

## 2021-07-21 ENCOUNTER — Ambulatory Visit: Payer: Medicare Other | Admitting: *Deleted

## 2021-07-21 DIAGNOSIS — I4891 Unspecified atrial fibrillation: Secondary | ICD-10-CM

## 2021-07-21 DIAGNOSIS — Z5181 Encounter for therapeutic drug level monitoring: Secondary | ICD-10-CM

## 2021-07-21 LAB — POCT INR: INR: 2.2 (ref 2.0–3.0)

## 2021-07-21 NOTE — Patient Instructions (Signed)
Continue warfarin 1 tablet daily except 1/2 tablet on Sundays ?Recheck in 4 weeks ?

## 2021-07-22 ENCOUNTER — Telehealth: Payer: Self-pay | Admitting: Cardiology

## 2021-07-22 MED ORDER — FUROSEMIDE 40 MG PO TABS: 20.0000 mg | ORAL_TABLET | Freq: Every day | ORAL | 6 refills | Status: DC

## 2021-07-22 NOTE — Telephone Encounter (Signed)
?*  STAT* If patient is at the pharmacy, call can be transferred to refill team. ? ? ?1. Which medications need to be refilled? (please list name of each medication and dose if known)  ?furosemide (LASIX) 40 MG tablet ? ?2. Which pharmacy/location (including street and city if local pharmacy) is medication to be sent to? ?[place], [place] - [address] ? ?3. Do they need a 30 day or 90 day supply?  ? ?30 day supply. ?Patient's wife states the patient is completely of medication. ? ?

## 2021-07-27 ENCOUNTER — Other Ambulatory Visit: Payer: Self-pay | Admitting: *Deleted

## 2021-07-27 DIAGNOSIS — N186 End stage renal disease: Secondary | ICD-10-CM

## 2021-07-28 ENCOUNTER — Telehealth: Payer: Self-pay | Admitting: Cardiology

## 2021-07-28 MED ORDER — FUROSEMIDE 40 MG PO TABS: 20.0000 mg | ORAL_TABLET | Freq: Every day | ORAL | 6 refills | Status: DC

## 2021-07-28 NOTE — Telephone Encounter (Signed)
Pt c/o medication issue: ? ?1. Name of Medication:  ?furosemide (LASIX) 40 MG tablet ? ?2. How are you currently taking this medication (dosage and times per day)?  ? ?3. Are you having a reaction (difficulty breathing--STAT)?  ? ?4. What is your medication issue?  ? ?Patient's wife is calling to clarify the correct dose for Furosemide. She assumes it has been increased to 100 MG. If so, she states the pharmacy will need a refill with the correct dose. Please advise as able. ? ?

## 2021-07-28 NOTE — Telephone Encounter (Signed)
Wife made aware of medication changes. Verbalized understanding. Request that a refill on furosemide be called in the [facility] in [place]. ?

## 2021-07-29 ENCOUNTER — Other Ambulatory Visit: Payer: Self-pay

## 2021-07-29 ENCOUNTER — Ambulatory Visit (INDEPENDENT_AMBULATORY_CARE_PROVIDER_SITE_OTHER): Payer: Medicare Other

## 2021-07-29 ENCOUNTER — Encounter: Payer: Self-pay | Admitting: Vascular Surgery

## 2021-07-29 ENCOUNTER — Ambulatory Visit (INDEPENDENT_AMBULATORY_CARE_PROVIDER_SITE_OTHER): Payer: Medicare Other | Admitting: Vascular Surgery

## 2021-07-29 VITALS — BP 180/73 | HR 75 | Ht 72.0 in | Wt 188.8 lb

## 2021-07-29 DIAGNOSIS — N186 End stage renal disease: Secondary | ICD-10-CM | POA: Diagnosis not present

## 2021-07-29 DIAGNOSIS — N184 Chronic kidney disease, stage 4 (severe): Secondary | ICD-10-CM

## 2021-07-29 NOTE — Progress Notes (Signed)
? ? ?[facility] ? ?Patient name: [name] [name] MRN: [id number] DOB: 05-Sep-1950 Sex: male ? ?REASON FOR CONSULT: Discuss access for hemodialysis ? ?HPI: ?[name] [name] is a 71 y.o. male, who is here today for discussion of access for hemodialysis.  He is here today with his wife.  He sees Dr. Edrick Oh for nephrology.  He has a complicated past history.  He underwent repair of a juxtarenal abdominal aortic aneurysm with Dr. Oneida Alar in February 2017.  Also had a left renal artery prosthetic bypass at that time.  He had renal failure following this procedure and had a hemodialysis catheter placed for short-term dialysis.  He had a renal recovery and the catheter was removed.  He has not had dialysis since 2017.  He has had progressive renal insufficiency and his creatinine is now 3.4.  We are seeing him today for discussion of hemodialysis access.  He is on chronic anticoagulation due to cardiac issues.  He is on chronic Coumadin therapy.  He is right-handed.  He does not have a pacemaker ? ?Past Medical History:  ?Diagnosis Date  ? AAA (abdominal aortic aneurysm)   ? a. s/p repair 06/2015 with left renal artery bypass at that time, post-op course c/b renal failure requiring dialysis, C dif.  ? Anemia   ? Arteriosclerotic cardiovascular disease (ASCVD)   ? a. AMI in 2000 treated at [facility]. b. cath in 12/2006->  Chronic total obstruction of the RCA;  drug-eluting stent placed in OM1, LVEF abnormal.  ? Calculus of gallbladder with acute cholecystitis without obstruction   ? Cerebrovascular disease 2002  ? carotid stent  ? Chronic anticoagulation   ? Chronic combined systolic and diastolic CHF (congestive heart failure) ([place])   ? CKD (chronic kidney disease), stage IV ([place])   ? COPD (chronic obstructive pulmonary disease) ([place])   ? GERD (gastroesophageal reflux disease)   ? Hyperlipidemia   ? Hypertension   ? Myocardial infarction ([place]) 10 yrs ago  ?  Nephrolithiasis   ? Permanent atrial fibrillation ([place])   ? Pseudoaneurysm of aorta ([place]) 06/09/2015  ? PVD (peripheral vascular disease) ([place])   ? Ct angiogram in 2009 revealed stable disease with 80% celiac stenosis,50% right renal artery ,ASCVD with ulceration in the abdominal aortashe  ? Testicular carcinoma ([place]) 1990  ? right orchiectomy  ? Tobacco abuse, in remission   ? 20 pack years; quit in 2009  ? ? ?Family History  ?Problem Relation Age of Onset  ? Colon cancer Father 20  ?     deceased  ? Prostate cancer Father   ? Heart disease Mother   ? Liver disease Neg Hx   ? Anesthesia problems Neg Hx   ? Hypotension Neg Hx   ? Malignant hyperthermia Neg Hx   ? Pseudochol deficiency Neg Hx   ? ? ?SOCIAL HISTORY: ?Social History  ? ?Socioeconomic History  ? Marital status: Married  ?  Spouse name: Not on file  ? Number of children: 2  ? Years of education: Not on file  ? Highest education level: Not on file  ?Occupational History  ? Occupation: disabled  ?  Employer: UNEMPLOYED  ?Tobacco Use  ? Smoking status: Every Day  ?  Packs/day: 0.50  ?  Years: 40.00  ?  Pack years: 20.00  ?  Types: Cigarettes  ?  Last attempt to quit: 03/02/2019  ?  Years since quitting: 2.4  ? Smokeless tobacco: Never  ? Tobacco comments:  ?  1/2 pack daily  ?Vaping Use  ? Vaping Use: Never used  ?Substance and Sexual Activity  ? Alcohol use: No  ?  Alcohol/week: 0.0 standard drinks  ? Drug use: No  ? Sexual activity: Yes  ?  Birth control/protection: None  ?Other Topics Concern  ? Not on file  ?Social History Narrative  ? Not on file  ? ?Social Determinants of Health  ? ?Financial Resource Strain: Low Risk   ? Difficulty of Paying Living Expenses: Not hard at all  ?Food Insecurity: No Food Insecurity  ? Worried About Charity fundraiser in the Last Year: Never true  ? Ran Out of Food in the Last Year: Never true  ?Transportation Needs: No Transportation Needs  ? Lack of Transportation (Medical): No  ? Lack of Transportation (Non-Medical): No   ?Physical Activity: Insufficiently Active  ? Days of Exercise per Week: 3 days  ? Minutes of Exercise per Session: 30 min  ?Stress: No Stress Concern Present  ? Feeling of Stress : Not at all  ?Social Connections: Socially Integrated  ? Frequency of Communication with Friends and Family: More than three times a week  ? Frequency of Social Gatherings with Friends and Family: More than three times a week  ? Attends Religious Services: More than 4 times per year  ? Active Member of Clubs or Organizations: Yes  ? Attends Archivist Meetings: More than 4 times per year  ? Marital Status: Married  ?Intimate Partner Violence: Not At Risk  ? Fear of Current or Ex-Partner: No  ? Emotionally Abused: No  ? Physically Abused: No  ? Sexually Abused: No  ? ? ?No Known Allergies ? ?Current Outpatient Medications  ?Medication Sig Dispense Refill  ? albuterol (PROVENTIL) (2.5 MG/3ML) 0.083% nebulizer solution USE 1 VIAL IN NEBULIZER EVERY 4 HOURS AS NEEDED FOR WHEEZING OR SHORTNESS OF BREATH 180 mL 0  ? albuterol (VENTOLIN HFA) 108 (90 Base) MCG/ACT inhaler Inhale 1-2 puffs into the lungs every 6 (six) hours as needed for wheezing or shortness of breath. 8 g 11  ? amLODipine (NORVASC) 10 MG tablet Take 1 tablet by mouth once daily 90 tablet 0  ? atorvastatin (LIPITOR) 20 MG tablet Take 1 tablet by mouth once daily 90 tablet 3  ? calcitRIOL (ROCALTROL) 0.25 MCG capsule Take 0.25 mcg by mouth daily.     ? fluticasone (FLONASE) 50 MCG/ACT nasal spray Place 1 spray into both nostrils daily as needed for allergies or rhinitis.    ? fluticasone furoate-vilanterol (BREO ELLIPTA) 100-25 MCG/INH AEPB Inhale 1 puff into the lungs daily. 1 each 11  ? Fluticasone-Umeclidin-Vilant (TRELEGY ELLIPTA) 100-62.5-25 MCG/ACT AEPB Inhale 1 puff into the lungs daily. 1 each 11  ? furosemide (LASIX) 40 MG tablet Take 0.5-1 tablets (20-40 mg total) by mouth daily. Alternate 1 whole tablet with 1/2 tablet daily 45 tablet 6  ? hydrALAZINE  (APRESOLINE) 100 MG tablet Take 1 tablet (100 mg total) by mouth 3 (three) times daily. 270 tablet 1  ? metoprolol succinate (TOPROL-XL) 100 MG 24 hr tablet TAKE 1 & 1/2 (ONE & ONE-HALF) TABLETS BY MOUTH ONCE DAILY 135 tablet 0  ? nitroGLYCERIN (NITROSTAT) 0.4 MG SL tablet place 1 tablet under the tongue if needed every 5 minutes for chest pain for 3 doses IF NO RELIEF AFTER 3RD DOSE CALL PRESCRIBER OR 911. 25 tablet 2  ? pantoprazole (PROTONIX) 40 MG tablet Take 1 tablet by mouth once daily 90 tablet 0  ? tamsulosin (  FLOMAX) 0.4 MG CAPS capsule TAKE 1 CAPSULE BY MOUTH ONCE DAILY AFTER SUPPER 90 capsule 0  ? warfarin (COUMADIN) 5 MG tablet TAKE 1 TABLET BY MOUTH ONCE DAILY EXCEPT  ONE  AND  ONE-HALF  TABLETS  ON  TUESDAYS 100 tablet 1  ? ?No current facility-administered medications for this visit.  ? ? ?REVIEW OF SYSTEMS:  ?'[X]'$  denotes positive finding, '[ ]'$  denotes negative finding ?Cardiac  Comments:  ?Chest pain or chest pressure:    ?Shortness of breath upon exertion: x   ?Short of breath when lying flat:    ?Irregular heart rhythm:    ?    ?Vascular    ?Pain in calf, thigh, or hip brought on by ambulation:    ?Pain in feet at night that wakes you up from your sleep:     ?Blood clot in your veins:    ?Leg swelling:     ?    ?Pulmonary    ?Oxygen at home:    ?Productive cough:     ?Wheezing:     ?    ?Neurologic    ?Sudden weakness in arms or legs:     ?Sudden numbness in arms or legs:     ?Sudden onset of difficulty speaking or slurred speech:    ?Temporary loss of vision in one eye:     ?Problems with dizziness:     ?    ?Gastrointestinal    ?Blood in stool:     ?Vomited blood:     ?    ?Genitourinary    ?Burning when urinating:     ?Blood in urine:    ?    ?Psychiatric    ?Major depression:     ?    ?Hematologic    ?Bleeding problems:    ?Problems with blood clotting too easily:    ?    ?Skin    ?Rashes or ulcers:    ?    ?Constitutional    ?Fever or chills:    ? ? ?PHYSICAL EXAM: ?Vitals:  ? 07/29/21 1236   ?BP: (!) 180/73  ?Pulse: 75  ?Weight: 188 lb 12.8 oz (85.6 kg)  ?Height: 6' (1.829 m)  ? ? ?GENERAL: The patient is a well-nourished male, in no acute distress. The vital signs are documented above. ?CARDIOVASCULA

## 2021-07-31 ENCOUNTER — Other Ambulatory Visit: Payer: Self-pay

## 2021-08-03 ENCOUNTER — Ambulatory Visit (INDEPENDENT_AMBULATORY_CARE_PROVIDER_SITE_OTHER): Payer: Medicare Other | Admitting: Internal Medicine

## 2021-08-03 ENCOUNTER — Encounter: Payer: Self-pay | Admitting: Internal Medicine

## 2021-08-03 VITALS — BP 128/82 | HR 64 | Resp 18 | Ht 72.0 in | Wt 189.2 lb

## 2021-08-03 DIAGNOSIS — I480 Paroxysmal atrial fibrillation: Secondary | ICD-10-CM | POA: Diagnosis not present

## 2021-08-03 DIAGNOSIS — I10 Essential (primary) hypertension: Secondary | ICD-10-CM

## 2021-08-03 DIAGNOSIS — J449 Chronic obstructive pulmonary disease, unspecified: Secondary | ICD-10-CM | POA: Diagnosis not present

## 2021-08-03 NOTE — Patient Instructions (Signed)
Please use Trelegy regularly and use Albuterol only as needed for shortness of breath. ? ?Please continue to take other medications as prescribed. ?

## 2021-08-03 NOTE — Progress Notes (Signed)
? ?Established Patient Office Visit ? ?Subjective:  ?Patient ID: [name] [name], male    DOB: 05-Aug-1950  Age: 71 y.o. MRN: [id number] ? ?CC:  ?Chief Complaint  ?Patient presents with  ? Follow-up  ?  3 month follow up HTN and COPD   ? ? ?HPI ?[name] [name] is a 71 y.o. male with past medical history of CAD s/p stent placement, AAA s/p repair, paroxysmal A Fib, CKD stage 4, HTN, COPD, GERD and HLD who presents for f/u of his chronic medical conditions. ? ?HTN: His BP was well controlled today.  He denies any chest pain, dyspnea or palpitations currently.  He follows up with cardiology for history of CHF and nephrology for history of CKD.  He is planning to get right arm AV fistula placed. ? ?COPD: He states that his breathing is better now.  He has not required albuterol recently.  He was placed on Trelegy in the last visit, but has not been compliant to it.  Denies any dyspnea or wheezing currently. ? ?Past Medical History:  ?Diagnosis Date  ? AAA (abdominal aortic aneurysm) ([place])   ? a. s/p repair 06/2015 with left renal artery bypass at that time, post-op course c/b renal failure requiring dialysis, C dif.  ? Anemia   ? Arteriosclerotic cardiovascular disease (ASCVD)   ? a. AMI in 2000 treated at [facility]. b. cath in 12/2006->  Chronic total obstruction of the RCA;  drug-eluting stent placed in OM1, LVEF abnormal.  ? Calculus of gallbladder with acute cholecystitis without obstruction   ? Cerebrovascular disease 2002  ? carotid stent  ? Chronic anticoagulation   ? Chronic combined systolic and diastolic CHF (congestive heart failure) ([place])   ? CKD (chronic kidney disease), stage IV ([place])   ? COPD (chronic obstructive pulmonary disease) ([place])   ? GERD (gastroesophageal reflux disease)   ? Hyperlipidemia   ? Hypertension   ? Myocardial infarction ([place]) 10 yrs ago  ? Nephrolithiasis   ? Permanent atrial fibrillation ([place])   ? Pseudoaneurysm of aorta ([place]) 06/09/2015  ? PVD (peripheral vascular disease) ([place])   ? Ct  angiogram in 2009 revealed stable disease with 80% celiac stenosis,50% right renal artery ,ASCVD with ulceration in the abdominal aortashe  ? Testicular carcinoma ([place]) 1990  ? right orchiectomy  ? Tobacco abuse, in remission   ? 20 pack years; quit in 2009  ? ? ?Past Surgical History:  ?Procedure Laterality Date  ? ABDOMINAL AORTIC ANEURYSM REPAIR N/A 06/09/2015  ? Procedure: ANEURYSM ABDOMINAL AORTIC REPAIR;  Surgeon: Elam Dutch, MD;  Location: [facility];  Service: Vascular;  Laterality: N/A;  ? AORTIC ENDARTERECETOMY N/A 06/09/2015  ? Procedure: AORTIC ENDARTERECETOMY;  Surgeon: Elam Dutch, MD;  Location: [facility];  Service: Vascular;  Laterality: N/A;  ? AORTIC/RENAL BYPASS Left 06/09/2015  ? Procedure: LEFT RENAL Artery BYPASS;  Surgeon: Elam Dutch, MD;  Location: [place] [facility];  Service: Vascular;  Laterality: Left;  ? APPENDECTOMY  2004  ? CHOLECYSTECTOMY N/A 09/18/2018  ? Procedure: LAPAROSCOPIC CHOLECYSTECTOMY;  Surgeon: Aviva Signs, MD;  Location: [facility];  Service: General;  Laterality: N/A;  ? COLONOSCOPY  06/17/2011  ? INCOMPLETE, PREP POOR. Procedure: COLONOSCOPY;  Surgeon: Daneil Dolin, MD;  [facility];  Service: Endoscopy;  Laterality: N/A;  10:00  ? COLONOSCOPY  07/15/2011  ? [id number] rectal and colon polyps  ? COLONOSCOPY N/A 05/22/2015  ? [id number] colonic and rectal polyps. tubular adenomas.repeat TCS 05/2018  ? ESOPHAGEAL DILATION  N/A 05/22/2015  ? Procedure: ESOPHAGEAL DILATION;  Surgeon: Daneil Dolin, MD;  [facility];  Service: Endoscopy;  Laterality: N/A;  ? ESOPHAGOGASTRODUODENOSCOPY  06/17/2011  ? severe erosive/ulcerative reflux esophagitis, soft noncritical stricture dilatied, small hh, antral erosion   ? ESOPHAGOGASTRODUODENOSCOPY N/A 05/22/2015  ? RMR: 2 cm HH otherwise normal. s/p empirical dilation.  ? INSERTION OF DIALYSIS CATHETER Left 06/26/2015  ? Procedure: INSERTION OF DIALYSIS CATHETER;  Surgeon: Angelia Mould, MD;  Location: [facility];  Service:  Vascular;  Laterality: Left;  ? PERIPHERAL VASCULAR CATHETERIZATION N/A 05/28/2015  ? Procedure: Abdominal Aortogram;  Surgeon: Serafina Mitchell, MD;  Location: [facility];  Service: Cardiovascular;  Laterality: N/A;  ? testicular cancer  1990  ? right orchiectomy  ? ? ?Family History  ?Problem Relation Age of Onset  ? Colon cancer Father 41  ?     deceased  ? Prostate cancer Father   ? Heart disease Mother   ? Liver disease Neg Hx   ? Anesthesia problems Neg Hx   ? Hypotension Neg Hx   ? Malignant hyperthermia Neg Hx   ? Pseudochol deficiency Neg Hx   ? ? ?Social History  ? ?Socioeconomic History  ? Marital status: Married  ?  Spouse name: Not on file  ? Number of children: 2  ? Years of education: Not on file  ? Highest education level: Not on file  ?Occupational History  ? Occupation: disabled  ?  Employer: UNEMPLOYED  ?Tobacco Use  ? Smoking status: Every Day  ?  Packs/day: 0.50  ?  Years: 40.00  ?  Pack years: 20.00  ?  Types: Cigarettes  ?  Last attempt to quit: 03/02/2019  ?  Years since quitting: 2.4  ? Smokeless tobacco: Never  ? Tobacco comments:  ?  1/2 pack daily  ?Vaping Use  ? Vaping Use: Never used  ?Substance and Sexual Activity  ? Alcohol use: No  ?  Alcohol/week: 0.0 standard drinks  ? Drug use: No  ? Sexual activity: Yes  ?  Birth control/protection: None  ?Other Topics Concern  ? Not on file  ?Social History Narrative  ? Not on file  ? ?Social Determinants of Health  ? ?Financial Resource Strain: Low Risk   ? Difficulty of Paying Living Expenses: Not hard at all  ?Food Insecurity: No Food Insecurity  ? Worried About Charity fundraiser in the Last Year: Never true  ? Ran Out of Food in the Last Year: Never true  ?Transportation Needs: No Transportation Needs  ? Lack of Transportation (Medical): No  ? Lack of Transportation (Non-Medical): No  ?Physical Activity: Insufficiently Active  ? Days of Exercise per Week: 3 days  ? Minutes of Exercise per Session: 30 min  ?Stress: No Stress Concern  Present  ? Feeling of Stress : Not at all  ?Social Connections: Socially Integrated  ? Frequency of Communication with Friends and Family: More than three times a week  ? Frequency of Social Gatherings with Friends and Family: More than three times a week  ? Attends Religious Services: More than 4 times per year  ? Active Member of Clubs or Organizations: Yes  ? Attends Archivist Meetings: More than 4 times per year  ? Marital Status: Married  ?Intimate Partner Violence: Not At Risk  ? Fear of Current or Ex-Partner: No  ? Emotionally Abused: No  ? Physically Abused: No  ? Sexually Abused: No  ? ? ?  Outpatient Medications Prior to Visit  ?Medication Sig Dispense Refill  ? albuterol (PROVENTIL) (2.5 MG/3ML) 0.083% nebulizer solution USE 1 VIAL IN NEBULIZER EVERY 4 HOURS AS NEEDED FOR WHEEZING OR SHORTNESS OF BREATH 180 mL 0  ? albuterol (VENTOLIN HFA) 108 (90 Base) MCG/ACT inhaler Inhale 1-2 puffs into the lungs every 6 (six) hours as needed for wheezing or shortness of breath. 8 g 11  ? amLODipine (NORVASC) 10 MG tablet Take 1 tablet by mouth once daily 90 tablet 0  ? atorvastatin (LIPITOR) 20 MG tablet Take 1 tablet by mouth once daily 90 tablet 3  ? calcitRIOL (ROCALTROL) 0.25 MCG capsule Take 0.25 mcg by mouth daily.     ? fluticasone (FLONASE) 50 MCG/ACT nasal spray Place 1 spray into both nostrils daily as needed for allergies or rhinitis.    ? Fluticasone-Umeclidin-Vilant (TRELEGY ELLIPTA) 100-62.5-25 MCG/ACT AEPB Inhale 1 puff into the lungs daily. 1 each 11  ? furosemide (LASIX) 40 MG tablet Take 0.5-1 tablets (20-40 mg total) by mouth daily. Alternate 1 whole tablet with 1/2 tablet daily 45 tablet 6  ? hydrALAZINE (APRESOLINE) 100 MG tablet Take 1 tablet (100 mg total) by mouth 3 (three) times daily. 270 tablet 1  ? metoprolol succinate (TOPROL-XL) 100 MG 24 hr tablet TAKE 1 & 1/2 (ONE & ONE-HALF) TABLETS BY MOUTH ONCE DAILY 135 tablet 0  ? nitroGLYCERIN (NITROSTAT) 0.4 MG SL tablet place 1 tablet  under the tongue if needed every 5 minutes for chest pain for 3 doses IF NO RELIEF AFTER 3RD DOSE CALL PRESCRIBER OR 911. 25 tablet 2  ? pantoprazole (PROTONIX) 40 MG tablet Take 1 tablet by mouth once dai

## 2021-08-03 NOTE — Assessment & Plan Note (Signed)
BP Readings from Last 1 Encounters:  ?08/03/21 128/82  ? ?Usually well-controlled with Amlodipine, Metoprolol and Hydralazine - followed by Cardiology and Nephrology ?Counseled for compliance with the medications ?Advised DASH diet and moderate exercise/walking as tolerated ?

## 2021-08-03 NOTE — Assessment & Plan Note (Signed)
On Metoprolol for rate control ?On Coumadin ?Follows up with [facility] clinic ?

## 2021-08-03 NOTE — Assessment & Plan Note (Signed)
Quit smoking in 2022 ?Had acute bronchitis recently, better with Azithromycin and Sterapred now ?Well-controlled now, advised to stay compliant with Trelegy and use albuterol as needed ?

## 2021-08-10 ENCOUNTER — Other Ambulatory Visit: Payer: Self-pay | Admitting: Cardiology

## 2021-08-11 ENCOUNTER — Telehealth: Payer: Self-pay

## 2021-08-11 NOTE — Telephone Encounter (Signed)
Received a telephone call from patient's wife advising that patient has decided not to proceed with surgery on 08/18/21 for a right arm AVF vs AVG. Will update Dr. Donnetta Hutching of patient's decision.  ?

## 2021-08-13 ENCOUNTER — Encounter (HOSPITAL_COMMUNITY): Admission: RE | Admit: 2021-08-13 | Payer: Medicare Other | Source: Ambulatory Visit

## 2021-08-18 ENCOUNTER — Other Ambulatory Visit: Payer: Self-pay | Admitting: Internal Medicine

## 2021-08-18 ENCOUNTER — Ambulatory Visit: Admit: 2021-08-18 | Payer: Medicare Other | Admitting: Vascular Surgery

## 2021-08-18 ENCOUNTER — Ambulatory Visit: Payer: Medicare Other | Admitting: *Deleted

## 2021-08-18 DIAGNOSIS — I4891 Unspecified atrial fibrillation: Secondary | ICD-10-CM | POA: Diagnosis not present

## 2021-08-18 DIAGNOSIS — Z5181 Encounter for therapeutic drug level monitoring: Secondary | ICD-10-CM | POA: Diagnosis not present

## 2021-08-18 LAB — POCT INR: INR: 2.4 (ref 2.0–3.0)

## 2021-08-18 SURGERY — ARTERIOVENOUS (AV) FISTULA CREATION
Anesthesia: Choice | Laterality: Right

## 2021-08-18 NOTE — Patient Instructions (Signed)
Continue warfarin 1 tablet daily except 1/2 tablet on Sundays ?Recheck in 4 weeks ?

## 2021-08-24 ENCOUNTER — Other Ambulatory Visit: Payer: Self-pay | Admitting: Internal Medicine

## 2021-09-10 ENCOUNTER — Ambulatory Visit (INDEPENDENT_AMBULATORY_CARE_PROVIDER_SITE_OTHER): Payer: Medicare Other | Admitting: Cardiology

## 2021-09-10 ENCOUNTER — Encounter: Payer: Self-pay | Admitting: Cardiology

## 2021-09-10 VITALS — BP 130/60 | HR 62 | Ht 72.0 in | Wt 183.6 lb

## 2021-09-10 DIAGNOSIS — I5032 Chronic diastolic (congestive) heart failure: Secondary | ICD-10-CM

## 2021-09-10 DIAGNOSIS — I10 Essential (primary) hypertension: Secondary | ICD-10-CM

## 2021-09-10 NOTE — Patient Instructions (Signed)

## 2021-09-10 NOTE — Progress Notes (Signed)
? ? ? ?Clinical Summary ?Mr. Purifoy is a 71 y.o.male seen today for follow up of the following medical problems. Seen today for a focused visit on recent issues with acute on chronic diastolic HF and HTN.  ?  ? ?1.Acute on chronic diastolic HF ? - ehco 09/3974 LVEF 35-40% ?- Jan 2019 echo LVEF 50%. Abnormal diastolic function ?- lasix '40mg'$  daily led to [facility], has done well with '40mg'$  alternating days with '20mg'$  ?  ?  ?-Jan 2023 pcp visit increased leg swelling and weightin gain. Listed weight 208 lbs, 2 weeks prior had been 195 lbs.  ?- lasix increased at that time to '40mg'$  daily. At our f/u weigth was down to 188 lbs but still some ongoing edema, continued lasix at '40mg'$  daily ?- today swelling has completely resolved, no SOB/DOE. DId not get the labs we ordered but reports recent labs with [place] [facility].  ?  ? ?2.HTN ?- elevated at our last visit, we increased hydralazine to '100mg'$  tid.  ?  ?  ? ?  ?  ?  ?  ?  ?Past Medical History:  ?Diagnosis Date  ? AAA (abdominal aortic aneurysm) ([place])   ? a. s/p repair 06/2015 with left renal artery bypass at that time, post-op course c/b renal failure requiring dialysis, C dif.  ? Anemia   ? Arteriosclerotic cardiovascular disease (ASCVD)   ? a. AMI in 2000 treated at [facility]. b. cath in 12/2006->  Chronic total obstruction of the RCA;  drug-eluting stent placed in OM1, LVEF abnormal.  ? Calculus of gallbladder with acute cholecystitis without obstruction   ? Cerebrovascular disease 2002  ? carotid stent  ? Chronic anticoagulation   ? Chronic combined systolic and diastolic CHF (congestive heart failure) ([place])   ? CKD (chronic kidney disease), stage IV ([place])   ? COPD (chronic obstructive pulmonary disease) ([place])   ? GERD (gastroesophageal reflux disease)   ? Hyperlipidemia   ? Hypertension   ? Myocardial infarction ([place]) 10 yrs ago  ? Nephrolithiasis   ? Permanent atrial fibrillation ([place])   ? Pseudoaneurysm of aorta ([place]) 06/09/2015  ? PVD (peripheral vascular disease) ([place])   ? Ct  angiogram in 2009 revealed stable disease with 80% celiac stenosis,50% right renal artery ,ASCVD with ulceration in the abdominal aortashe  ? Testicular carcinoma ([place]) 1990  ? right orchiectomy  ? Tobacco abuse, in remission   ? 20 pack years; quit in 2009  ? ? ? ?No Known Allergies ? ? ?Current Outpatient Medications  ?Medication Sig Dispense Refill  ? albuterol (PROVENTIL) (2.5 MG/3ML) 0.083% nebulizer solution USE 1 VIAL IN NEBULIZER EVERY 4 HOURS AS NEEDED FOR WHEEZING OR SHORTNESS OF BREATH 180 mL 0  ? albuterol (VENTOLIN HFA) 108 (90 Base) MCG/ACT inhaler Inhale 1-2 puffs into the lungs every 6 (six) hours as needed for wheezing or shortness of breath. 8 g 11  ? amLODipine (NORVASC) 10 MG tablet Take 1 tablet by mouth once daily 90 tablet 0  ? atorvastatin (LIPITOR) 20 MG tablet Take 1 tablet by mouth once daily 90 tablet 3  ? calcitRIOL (ROCALTROL) 0.25 MCG capsule Take 0.25 mcg by mouth daily.     ? fluticasone (FLONASE) 50 MCG/ACT nasal spray Place 1 spray into both nostrils daily as needed for allergies or rhinitis.    ? Fluticasone-Umeclidin-Vilant (TRELEGY ELLIPTA) 100-62.5-25 MCG/ACT AEPB Inhale 1 puff into the lungs daily. 1 each 11  ? furosemide (LASIX) 40 MG tablet Take 0.5-1 tablets (20-40 mg total) by mouth  daily. Alternate 1 whole tablet with 1/2 tablet daily 45 tablet 6  ? hydrALAZINE (APRESOLINE) 100 MG tablet Take 1 tablet (100 mg total) by mouth 3 (three) times daily. 270 tablet 1  ? metoprolol succinate (TOPROL-XL) 100 MG 24 hr tablet TAKE 1 & 1/2 (ONE & ONE-HALF) TABLETS BY MOUTH ONCE DAILY 135 tablet 1  ? nitroGLYCERIN (NITROSTAT) 0.4 MG SL tablet place 1 tablet under the tongue if needed every 5 minutes for chest pain for 3 doses IF NO RELIEF AFTER 3RD DOSE CALL PRESCRIBER OR 911. 25 tablet 2  ? pantoprazole (PROTONIX) 40 MG tablet Take 1 tablet by mouth once daily 90 tablet 0  ? tamsulosin (FLOMAX) 0.4 MG CAPS capsule TAKE 1 CAPSULE BY MOUTH ONCE DAILY AFTER SUPPER 90 capsule 0  ?  warfarin (COUMADIN) 5 MG tablet TAKE 1 TABLET BY MOUTH ONCE DAILY EXCEPT  ONE  AND  ONE-HALF  TABLETS  ON  TUESDAYS 100 tablet 1  ? ?No current facility-administered medications for this visit.  ? ? ? ?Past Surgical History:  ?Procedure Laterality Date  ? ABDOMINAL AORTIC ANEURYSM REPAIR N/A 06/09/2015  ? Procedure: ANEURYSM ABDOMINAL AORTIC REPAIR;  Surgeon: Elam Dutch, MD;  Location: [place];  Service: Vascular;  Laterality: N/A;  ? AORTIC ENDARTERECETOMY N/A 06/09/2015  ? Procedure: AORTIC ENDARTERECETOMY;  Surgeon: Elam Dutch, MD;  Location: [place];  Service: Vascular;  Laterality: N/A;  ? AORTIC/RENAL BYPASS Left 06/09/2015  ? Procedure: LEFT RENAL Artery BYPASS;  Surgeon: Elam Dutch, MD;  Location: [place];  Service: Vascular;  Laterality: Left;  ? APPENDECTOMY  2004  ? CHOLECYSTECTOMY N/A 09/18/2018  ? Procedure: LAPAROSCOPIC CHOLECYSTECTOMY;  Surgeon: Aviva Signs, MD;  [facility];  Service: General;  Laterality: N/A;  ? COLONOSCOPY  06/17/2011  ? INCOMPLETE, PREP POOR. Procedure: COLONOSCOPY;  Surgeon: Daneil Dolin, MD;  [facility];  Service: Endoscopy;  Laterality: N/A;  10:00  ? COLONOSCOPY  07/15/2011  ? [id number] rectal and colon polyps  ? COLONOSCOPY N/A 05/22/2015  ? PZW:[id number] colonic and rectal polyps. tubular adenomas.repeat TCS 05/2018  ? ESOPHAGEAL DILATION N/A 05/22/2015  ? Procedure: ESOPHAGEAL DILATION;  Surgeon: Daneil Dolin, MD;  [facility];  Service: Endoscopy;  Laterality: N/A;  ? ESOPHAGOGASTRODUODENOSCOPY  06/17/2011  ? severe erosive/ulcerative reflux esophagitis, soft noncritical stricture dilatied, small hh, antral erosion   ? ESOPHAGOGASTRODUODENOSCOPY N/A 05/22/2015  ? RMR: 2 cm HH otherwise normal. s/p empirical dilation.  ? INSERTION OF DIALYSIS CATHETER Left 06/26/2015  ? Procedure: INSERTION OF DIALYSIS CATHETER;  Surgeon: Angelia Mould, MD;  Location: [place];  Service: Vascular;  Laterality: Left;  ? PERIPHERAL VASCULAR  CATHETERIZATION N/A 05/28/2015  ? Procedure: Abdominal Aortogram;  Surgeon: Serafina Mitchell, MD;  Location: [place] [facility];  Service: Cardiovascular;  Laterality: N/A;  ? testicular cancer  1990  ? right orchiectomy  ? ? ? ?No Known Allergies ? ? ? ?Family History  ?Problem Relation Age of Onset  ? Colon cancer Father 6  ?     deceased  ? Prostate cancer Father   ? Heart disease Mother   ? Liver disease Neg Hx   ? Anesthesia problems Neg Hx   ? Hypotension Neg Hx   ? Malignant hyperthermia Neg Hx   ? Pseudochol deficiency Neg Hx   ? ? ? ?Social History ?Mr. Wedeking reports that he has been smoking cigarettes. He has a 20.00 pack-year smoking history. He has never used  smokeless tobacco. ?Mr. Provencal reports no history of alcohol use. ? ? ?Review of Systems ?CONSTITUTIONAL: No weight loss, fever, chills, weakness or fatigue.  ?HEENT: Eyes: No visual loss, blurred vision, double vision or yellow sclerae.No hearing loss, sneezing, congestion, runny nose or sore throat.  ?SKIN: No rash or itching.  ?CARDIOVASCULAR: per hpi ?RESPIRATORY: No shortness of breath, cough or sputum.  ?GASTROINTESTINAL: No anorexia, nausea, vomiting or diarrhea. No abdominal pain or blood.  ?GENITOURINARY: No burning on urination, no polyuria ?NEUROLOGICAL: No headache, dizziness, syncope, paralysis, ataxia, numbness or tingling in the extremities. No change in bowel or bladder control.  ?MUSCULOSKELETAL: No muscle, back pain, joint pain or stiffness.  ?LYMPHATICS: No enlarged nodes. No history of splenectomy.  ?PSYCHIATRIC: No history of depression or anxiety.  ?ENDOCRINOLOGIC: No reports of sweating, cold or heat intolerance. No polyuria or polydipsia.  ?. ? ? ?Physical Examination ?Today's Vitals  ? 09/10/21 0835  ?BP: 130/60  ?Pulse: 62  ?SpO2: 98%  ?Weight: 183 lb 9.6 oz (83.3 kg)  ?Height: 6' (1.829 m)  ? ?Body mass index is 24.9 kg/m?. ? ?Gen: resting comfortably, no acute distress ?HEENT: no scleral icterus, pupils equal round and  reactive, no palptable cervical adenopathy,  ?CV: RRR, no m/r/g no jvd ?Resp: Clear to auscultation bilaterally ?GI: abdomen is soft, non-tender, non-distended, normal bowel sounds, no hepatosplenomegaly ?MSK: extremities

## 2021-09-15 ENCOUNTER — Ambulatory Visit: Payer: Medicare Other | Admitting: *Deleted

## 2021-09-15 DIAGNOSIS — Z5181 Encounter for therapeutic drug level monitoring: Secondary | ICD-10-CM | POA: Diagnosis not present

## 2021-09-15 DIAGNOSIS — I4891 Unspecified atrial fibrillation: Secondary | ICD-10-CM

## 2021-09-15 LAB — POCT INR: INR: 2.4 (ref 2.0–3.0)

## 2021-09-15 NOTE — Patient Instructions (Signed)
Continue warfarin 1 tablet daily except 1/2 tablet on Sundays Recheck in 6 weeks 

## 2021-10-09 ENCOUNTER — Other Ambulatory Visit: Payer: Self-pay | Admitting: Internal Medicine

## 2021-10-27 ENCOUNTER — Ambulatory Visit (INDEPENDENT_AMBULATORY_CARE_PROVIDER_SITE_OTHER): Payer: Medicare Other | Admitting: *Deleted

## 2021-10-27 DIAGNOSIS — I4891 Unspecified atrial fibrillation: Secondary | ICD-10-CM

## 2021-10-27 DIAGNOSIS — Z5181 Encounter for therapeutic drug level monitoring: Secondary | ICD-10-CM

## 2021-10-27 LAB — POCT INR: INR: 2.6 (ref 2.0–3.0)

## 2021-11-24 ENCOUNTER — Other Ambulatory Visit: Payer: Self-pay | Admitting: Internal Medicine

## 2021-11-24 DIAGNOSIS — J449 Chronic obstructive pulmonary disease, unspecified: Secondary | ICD-10-CM

## 2021-12-02 ENCOUNTER — Other Ambulatory Visit: Payer: Self-pay | Admitting: Internal Medicine

## 2021-12-02 DIAGNOSIS — J441 Chronic obstructive pulmonary disease with (acute) exacerbation: Secondary | ICD-10-CM

## 2021-12-03 ENCOUNTER — Encounter: Payer: Medicare Other | Admitting: Internal Medicine

## 2021-12-07 ENCOUNTER — Other Ambulatory Visit: Payer: Self-pay | Admitting: Internal Medicine

## 2021-12-07 DIAGNOSIS — J441 Chronic obstructive pulmonary disease with (acute) exacerbation: Secondary | ICD-10-CM

## 2021-12-08 ENCOUNTER — Ambulatory Visit (INDEPENDENT_AMBULATORY_CARE_PROVIDER_SITE_OTHER): Payer: Medicare Other | Admitting: *Deleted

## 2021-12-08 DIAGNOSIS — Z5181 Encounter for therapeutic drug level monitoring: Secondary | ICD-10-CM | POA: Diagnosis not present

## 2021-12-08 DIAGNOSIS — I4891 Unspecified atrial fibrillation: Secondary | ICD-10-CM

## 2021-12-08 LAB — POCT INR: INR: 2.4 (ref 2.0–3.0)

## 2021-12-08 NOTE — Patient Instructions (Signed)
Continue warfarin 1 tablet daily except 1/2 tablet on Sundays Recheck in 6 weeks 

## 2021-12-16 ENCOUNTER — Other Ambulatory Visit: Payer: Self-pay | Admitting: Cardiology

## 2021-12-22 ENCOUNTER — Other Ambulatory Visit: Payer: Self-pay | Admitting: Internal Medicine

## 2021-12-22 DIAGNOSIS — J441 Chronic obstructive pulmonary disease with (acute) exacerbation: Secondary | ICD-10-CM

## 2022-01-12 ENCOUNTER — Other Ambulatory Visit: Payer: Self-pay | Admitting: Internal Medicine

## 2022-01-19 ENCOUNTER — Ambulatory Visit: Payer: Medicare Other | Attending: Cardiology | Admitting: *Deleted

## 2022-01-19 DIAGNOSIS — I4891 Unspecified atrial fibrillation: Secondary | ICD-10-CM

## 2022-01-19 DIAGNOSIS — Z5181 Encounter for therapeutic drug level monitoring: Secondary | ICD-10-CM | POA: Diagnosis not present

## 2022-01-19 LAB — POCT INR: INR: 1.9 — AB (ref 2.0–3.0)

## 2022-01-19 NOTE — Patient Instructions (Signed)
Take warfarin 1 1/2 tablets today the resume 1 tablet daily except 1/2 tablet on Sundays Recheck in 6 weeks

## 2022-01-26 ENCOUNTER — Other Ambulatory Visit: Payer: Self-pay | Admitting: Cardiology

## 2022-02-01 ENCOUNTER — Other Ambulatory Visit: Payer: Self-pay | Admitting: Cardiology

## 2022-02-22 ENCOUNTER — Encounter: Payer: Self-pay | Admitting: Internal Medicine

## 2022-02-22 ENCOUNTER — Ambulatory Visit (INDEPENDENT_AMBULATORY_CARE_PROVIDER_SITE_OTHER): Payer: Medicare Other | Admitting: Internal Medicine

## 2022-02-22 VITALS — BP 112/61 | HR 66 | Ht 72.0 in | Wt 178.4 lb

## 2022-02-22 DIAGNOSIS — J440 Chronic obstructive pulmonary disease with acute lower respiratory infection: Secondary | ICD-10-CM

## 2022-02-22 DIAGNOSIS — Z0000 Encounter for general adult medical examination without abnormal findings: Secondary | ICD-10-CM | POA: Diagnosis not present

## 2022-02-22 DIAGNOSIS — J209 Acute bronchitis, unspecified: Secondary | ICD-10-CM

## 2022-02-22 DIAGNOSIS — J449 Chronic obstructive pulmonary disease, unspecified: Secondary | ICD-10-CM | POA: Diagnosis not present

## 2022-02-22 DIAGNOSIS — J441 Chronic obstructive pulmonary disease with (acute) exacerbation: Secondary | ICD-10-CM | POA: Diagnosis not present

## 2022-02-22 MED ORDER — TRELEGY ELLIPTA 100-62.5-25 MCG/ACT IN AEPB: 1.0000 | INHALATION_SPRAY | Freq: Every day | RESPIRATORY_TRACT | 11 refills | Status: DC

## 2022-02-22 NOTE — Assessment & Plan Note (Signed)
Given Trelegy sample at [facility]. Also sent prescription to pharmacy for future.

## 2022-02-22 NOTE — Progress Notes (Signed)
Subjective:   [name] [name] is a 71 y.o. male who presents for Medicare Annual/Subsequent preventive examination.  Review of Systems    Review of Systems  All other systems reviewed and are negative.    Objective:    Today's Vitals   02/22/22 1542  BP: 112/61  Pulse: 66  SpO2: 95%  Weight: 178 lb 6.4 oz (80.9 kg)  Height: 6' (1.829 m)   Body mass index is 24.2 kg/m.     02/22/2022    3:46 PM 02/16/2021    3:36 PM 08/24/2020    9:06 PM 10/16/2019    4:06 PM 08/20/2019    4:00 AM 08/19/2019    9:07 PM 09/12/2018   10:22 AM  Advanced Directives  Does Patient Have a Medical Advance Directive? Yes No No No No No No  Does patient want to make changes to medical advance directive? Yes (MAU/Ambulatory/Procedural Areas - Information given)        Would patient like information on creating a medical advance directive?  No - Patient declined  No - Patient declined No - Patient declined  No - Patient declined    Current Medications (verified) Outpatient Encounter Medications as of 02/22/2022  Medication Sig   albuterol (PROVENTIL) (2.5 MG/3ML) 0.083% nebulizer solution USE 1 VIAL IN NEBULIZER EVERY 4 HOURS AS NEEDED FOR WHEEZING OR SHORTNESS OF BREATH   albuterol (VENTOLIN HFA) 108 (90 Base) MCG/ACT inhaler Inhale 1-2 puffs into the lungs every 6 (six) hours as needed for wheezing or shortness of breath.   amLODipine (NORVASC) 10 MG tablet Take 1 tablet by mouth once daily   atorvastatin (LIPITOR) 20 MG tablet Take 1 tablet by mouth once daily   BREO ELLIPTA 100-25 MCG/ACT AEPB Inhale 1 puff by mouth once daily   calcitRIOL (ROCALTROL) 0.25 MCG capsule Take 0.25 mcg by mouth daily.    fluticasone (FLONASE) 50 MCG/ACT nasal spray Place 1 spray into both nostrils daily as needed for allergies or rhinitis.   Fluticasone-Umeclidin-Vilant (TRELEGY ELLIPTA) 100-62.5-25 MCG/ACT AEPB Inhale 1 puff into the lungs daily.   furosemide (LASIX) 40 MG tablet Take 0.5-1 tablets (20-40 mg total)  by mouth daily. Alternate 1 whole tablet with 1/2 tablet daily   hydrALAZINE (APRESOLINE) 100 MG tablet TAKE 1 TABLET BY MOUTH THREE TIMES DAILY   metoprolol succinate (TOPROL-XL) 100 MG 24 hr tablet TAKE 1 & 1/2 (ONE & ONE-HALF) TABLETS BY MOUTH ONCE DAILY   nitroGLYCERIN (NITROSTAT) 0.4 MG SL tablet place 1 tablet under the tongue if needed every 5 minutes for chest pain for 3 doses IF NO RELIEF AFTER 3RD DOSE CALL PRESCRIBER OR 911.   pantoprazole (PROTONIX) 40 MG tablet Take 1 tablet by mouth once daily   tamsulosin (FLOMAX) 0.4 MG CAPS capsule TAKE 1 CAPSULE BY MOUTH ONCE DAILY AFTER SUPPER   warfarin (COUMADIN) 5 MG tablet TAKE 1/2 TO 1 TABLET BY MOUTH ONCE DAILY AS DIRECTED BY COUMADIN CLINIC   No facility-administered encounter medications on file as of 02/22/2022.    Allergies (verified) Patient has no known allergies.   History: Past Medical History:  Diagnosis Date   AAA (abdominal aortic aneurysm) ([place])    a. s/p repair 06/2015 with left renal artery bypass at that time, post-op course c/b renal failure requiring dialysis, C dif.   Anemia    Arteriosclerotic cardiovascular disease (ASCVD)    a. AMI in 2000 treated at [facility]. b. cath in 12/2006->  Chronic total obstruction of the RCA;  drug-eluting stent  placed in OM1, LVEF abnormal.   Calculus of gallbladder with acute cholecystitis without obstruction    Cerebrovascular disease 2002   carotid stent   Chronic anticoagulation    Chronic combined systolic and diastolic CHF (congestive heart failure) (H[facility])    CKD (chronic kidney disease), stage IV (H[facility])    COPD (chronic obstructive pulmonary disease) (H[facility])    GERD (gastroesophageal reflux disease)    Hyperlipidemia    Hypertension    Myocardial infarction ([place]) 10 yrs ago   Nephrolithiasis    Permanent atrial fibrillation ([facility])    Pseudoaneurysm of aorta ([place]) 06/09/2015   PVD (peripheral vascular disease) ([place])    Ct angiogram in 2009 revealed stable disease with 80%  celiac stenosis,50% right renal artery ,ASCVD with ulceration in the abdominal aortashe   Testicular carcinoma ([place]) 1990   right orchiectomy   Tobacco abuse, in remission    20 pack years; quit in 2009   Past Surgical History:  Procedure Laterality Date   ABDOMINAL AORTIC ANEURYSM REPAIR N/A 06/09/2015   Procedure: ANEURYSM ABDOMINAL AORTIC REPAIR;  Surgeon: Elam Dutch, MD;  Location: [place];  Service: Vascular;  Laterality: N/A;   AORTIC ENDARTERECETOMY N/A 06/09/2015   Procedure: AORTIC ENDARTERECETOMY;  Surgeon: Elam Dutch, MD;  Location: [place];  Service: Vascular;  Laterality: N/A;   AORTIC/RENAL BYPASS Left 06/09/2015   Procedure: LEFT RENAL Artery BYPASS;  Surgeon: Elam Dutch, MD;  Location: [place];  Service: Vascular;  Laterality: Left;   APPENDECTOMY  2004   CHOLECYSTECTOMY N/A 09/18/2018   Procedure: LAPAROSCOPIC CHOLECYSTECTOMY;  Surgeon: Aviva Signs, MD;  Location: [facility];  Service: General;  Laterality: N/A;   COLONOSCOPY  06/17/2011   INCOMPLETE, PREP POOR. Procedure: COLONOSCOPY;  Surgeon: Daneil Dolin, MD;  [facility];  Service: Endoscopy;  Laterality: N/A;  10:00   COLONOSCOPY  07/15/2011   [id number] rectal and colon polyps   COLONOSCOPY N/A 05/22/2015   LNL:[id number] colonic and rectal polyps. tubular adenomas.repeat TCS 05/2018   ESOPHAGEAL DILATION N/A 05/22/2015   Procedure: ESOPHAGEAL DILATION;  Surgeon: Daneil Dolin, MD;  [facility];  Service: Endoscopy;  Laterality: N/A;   ESOPHAGOGASTRODUODENOSCOPY  06/17/2011   severe erosive/ulcerative reflux esophagitis, soft noncritical stricture dilatied, small hh, antral erosion    ESOPHAGOGASTRODUODENOSCOPY N/A 05/22/2015   RMR: 2 cm HH otherwise normal. s/p empirical dilation.   INSERTION OF DIALYSIS CATHETER Left 06/26/2015   Procedure: INSERTION OF DIALYSIS CATHETER;  Surgeon: Angelia Mould, MD;  Location: [place];  Service: Vascular;  Laterality: Left;   PERIPHERAL VASCULAR  CATHETERIZATION N/A 05/28/2015   Procedure: Abdominal Aortogram;  Surgeon: Serafina Mitchell, MD;  Location: [place] [facility];  Service: Cardiovascular;  Laterality: N/A;   testicular cancer  1990   right orchiectomy   Family History  Problem Relation Age of Onset   Colon cancer Father 44       deceased   Prostate cancer Father    Heart disease Mother    Liver disease Neg Hx    Anesthesia problems Neg Hx    Hypotension Neg Hx    Malignant hyperthermia Neg Hx    Pseudochol deficiency Neg Hx    Social History   Socioeconomic History   Marital status: Married    Spouse name: Not on file   Number of children: 2   Years of education: Not on file   Highest education level: Not on file  Occupational History   Occupation: disabled  Employer: UNEMPLOYED  Tobacco Use   Smoking status: Every Day    Packs/day: 0.50    Years: 40.00    Total pack years: 20.00    Types: Cigarettes    Last attempt to quit: 03/02/2019    Years since quitting: 2.9   Smokeless tobacco: Never   Tobacco comments:    1/2 pack daily  Vaping Use   Vaping Use: Never used  Substance and Sexual Activity   Alcohol use: No    Alcohol/week: 0.0 standard drinks of alcohol   Drug use: No   Sexual activity: Yes    Birth control/protection: None  Other Topics Concern   Not on file  Social History Narrative   Not on file   Social Determinants of Health   Financial Resource Strain: Low Risk  (02/16/2021)   Overall Financial Resource Strain (CARDIA)    Difficulty of Paying Living Expenses: Not hard at all  Food Insecurity: No Food Insecurity (02/16/2021)   Hunger Vital Sign    Worried About Running Out of Food in the Last Year: Never true    Ran Out of Food in the Last Year: Never true  Transportation Needs: No Transportation Needs (02/16/2021)   PRAPARE - Hydrologist (Medical): No    Lack of Transportation (Non-Medical): No  Physical Activity: Insufficiently Active  (02/16/2021)   Exercise Vital Sign    Days of Exercise per Week: 3 days    Minutes of Exercise per Session: 30 min  Stress: No Stress Concern Present (02/16/2021)   [place]    Feeling of Stress : Not at all  Social Connections: [place] (02/16/2021)   Social Connection and Isolation Panel [NHANES]    Frequency of Communication with Friends and Family: More than three times a week    Frequency of Social Gatherings with Friends and Family: More than three times a week    Attends Religious Services: More than 4 times per year    Active Member of [employer] or Organizations: Yes    Attends Music therapist: More than 4 times per year    Marital Status: Married    Tobacco Counseling Ready to quit: Not Answered Counseling given: Not Answered Tobacco comments: 1/2 pack daily   Clinical Intake:  Pre-visit preparation completed: Yes  Pain : No/denies pain    How often do you need to have someone help you when you read instructions, pamphlets, or other written materials from your doctor or pharmacy?: 1 - Never What is the last grade level you completed in school?: 12th grade  Diabetic?No    Activities of Daily Living    02/22/2022    3:47 PM  In your present state of health, do you have any difficulty performing the following activities:  Hearing? 0  Vision? 0  Difficulty concentrating or making decisions? 0  Walking or climbing stairs? 0  Dressing or bathing? 0  Doing errands, shopping? 0    Patient Care Team: Lindell Spar, MD as PCP - General (Internal Medicine) Harl Bowie Alphonse Guild, MD as PCP - Cardiology (Cardiology) Gala Romney Cristopher Estimable, MD (Gastroenterology) Pa, [place] any recent Medical Services you may have received from other than [facility] providers in the past year (date may be approximate).     Assessment:   This is a routine wellness examination  for [place].  Hearing/Vision screen No results found.  Dietary issues and exercise activities discussed:  Goals Addressed   None    Depression Screen    02/22/2022    3:47 PM 02/22/2022    3:42 PM 08/03/2021    9:47 AM 05/21/2021   11:44 AM 05/04/2021   10:20 AM 04/22/2021    9:08 AM 02/17/2021   11:15 AM  PHQ 2/9 Scores  PHQ - 2 Score 0 0 0 0 0 0 0    Fall Risk    02/22/2022    3:47 PM 02/22/2022    3:42 PM 08/03/2021    9:47 AM 05/21/2021   11:43 AM 05/04/2021   10:20 AM  Fall Risk   Falls in the past year? 0 0 0 0 0  Number falls in past yr: 0 0 0 0 0  Injury with Fall? 0 0 0 0 0  Risk for fall due to :   No Fall Risks No Fall Risks No Fall Risks  Follow up   Falls evaluation completed Falls evaluation completed Falls evaluation completed    [place]:  Any stairs in or around the home? Yes  If so, are there any without handrails? No  Home free of loose throw rugs in walkways, pet beds, electrical cords, etc? No  Adequate lighting in your home to reduce risk of falls? Yes   ASSISTIVE DEVICES UTILIZED TO PREVENT FALLS:  Life alert? No  Use of a cane, walker or w/c? No  Grab bars in the bathroom? No  Shower chair or bench in shower? Yes  Elevated toilet seat or a handicapped toilet? Yes    Cognitive Function:        02/22/2022    3:50 PM 02/16/2021    3:39 PM  6CIT Screen  What Year? 0 points 0 points  What month? 0 points 0 points  What time? 0 points 0 points  Count back from 20 0 points 0 points  Months in reverse 4 points 4 points  Repeat phrase 2 points 4 points  Total Score 6 points 8 points    Immunizations Immunization History  Administered Date(s) Administered   Fluad Quad(high Dose 65+) 01/22/2020, 02/17/2021   Influenza, High Dose Seasonal PF 01/26/2018   Influenza-Unspecified 01/24/2014, 02/22/2015, 01/12/2016, 02/21/2017   Moderna Sars-Covid-2 Vaccination 09/20/2019, 12/13/2019   Pneumococcal  Conjugate-13 01/12/2016, 03/07/2018   Pneumococcal Polysaccharide-23 02/21/2017, 01/22/2020   Tdap 05/20/2011   Zoster Recombinat (Shingrix) 10/02/2020, 02/17/2021    TDAP status: Due, Education has been provided regarding the importance of this vaccine. Advised may receive this vaccine at local pharmacy or Health Dept. Aware to provide a copy of the vaccination record if obtained from local pharmacy or Health Dept. Verbalized acceptance and understanding.  Flu Vaccine status: Declined, Education has been provided regarding the importance of this vaccine but patient still declined. Advised may receive this vaccine at local pharmacy or Health Dept. Aware to provide a copy of the vaccination record if obtained from local pharmacy or Health Dept. Verbalized acceptance and understanding.  Pneumococcal vaccine status: Up to date  Covid-19 vaccine status: Information provided on how to obtain vaccines.   Qualifies for Shingles Vaccine? Yes   Zostavax completed No   Shingrix Completed?: Yes  Screening Tests Health Maintenance  Topic Date Due   COVID-19 Vaccine (3 - Moderna series) 02/07/2020   TETANUS/TDAP  05/19/2021   INFLUENZA VACCINE  12/01/2021   COLONOSCOPY (Pts 45-72yr Insurance coverage will need to be confirmed)  05/21/2025   Pneumonia Vaccine 69 Years old  Completed  Hepatitis C Screening  Completed   Zoster Vaccines- Shingrix  Completed   HPV VACCINES  Aged Out    Health Maintenance  Health Maintenance Due  Topic Date Due   COVID-19 Vaccine (3 - Moderna series) 02/07/2020   TETANUS/TDAP  05/19/2021   INFLUENZA VACCINE  12/01/2021    Colorectal cancer screening: Type of screening: Colonoscopy. Completed 05/22/2015. Repeat every 10 years  Lung Cancer Screening: (Low Dose CT Chest recommended if Age 49-80 years, 30 pack-year currently smoking OR have quit w/in 15years.) does not qualify.   Lung Cancer Screening Referral: Completed 05/2021, will follow up with PCP for  next screening  Additional Screening:  Hepatitis C Screening: does not qualify; Completed 12/28/2016  Vision Screening: Recommended annual ophthalmology exams for early detection of glaucoma and other disorders of the eye. Is the patient up to date with their annual eye exam?  No  Who is the provider or what is the name of the office in which the patient attends annual eye exams? [facility] If pt is not established with a provider, would they like to be referred to a provider to establish care? Yes . Dr.Cotter   Dental Screening: Recommended annual dental exams for proper oral hygiene  Community Resource Referral / Chronic Care Management: CRR required this visit?  No   CCM required this visit?  No      Plan:     I have personally reviewed and noted the following in the patient's chart:   Medical and social history Use of alcohol, tobacco or illicit drugs  Current medications and supplements including opioid prescriptions. Patient is not currently taking opioid prescriptions. Functional ability and status Nutritional status Physical activity Advanced directives List of other physicians Hospitalizations, surgeries, and ER visits in previous 12 months Vitals Screenings to include cognitive, depression, and falls Referrals and appointments  In addition, I have reviewed and discussed with patient certain preventive protocols, quality metrics, and best practice recommendations. A written personalized care plan for preventive services as well as general preventive health recommendations were provided to patient.     Lorene Dy, MD   02/22/2022

## 2022-02-22 NOTE — Addendum Note (Signed)
Addended by: Lyndal Pulley on: 02/22/2022 04:14 PM   Modules accepted: Orders

## 2022-02-22 NOTE — Patient Instructions (Signed)
  Mr. Barge , Thank you for taking time to come for your Medicare Wellness Visit. I appreciate your ongoing commitment to your health goals. Please review the following plan we discussed and let me know if I can assist you in the future.   These are the goals we discussed: Tetanus Vaccine . Consider Covid and Flue Vaccine in the future.  Goals      Quit Smoking        This is a list of the screening recommended for you and due dates:  Health Maintenance  Topic Date Due   COVID-19 Vaccine (3 - Moderna series) 02/07/2020   Tetanus Vaccine  05/19/2021   Flu Shot  12/01/2021   Colon Cancer Screening  05/21/2025   Pneumonia Vaccine  Completed   Hepatitis C Screening: USPSTF Recommendation to screen - Ages 18-79 yo.  Completed   Zoster (Shingles) Vaccine  Completed   HPV Vaccine  Aged Out

## 2022-02-23 ENCOUNTER — Other Ambulatory Visit: Payer: Self-pay | Admitting: Internal Medicine

## 2022-03-02 ENCOUNTER — Ambulatory Visit: Payer: Medicare Other | Attending: Cardiology | Admitting: *Deleted

## 2022-03-02 DIAGNOSIS — Z5181 Encounter for therapeutic drug level monitoring: Secondary | ICD-10-CM

## 2022-03-02 DIAGNOSIS — I4891 Unspecified atrial fibrillation: Secondary | ICD-10-CM

## 2022-03-02 LAB — POCT INR: INR: 1.6 — AB (ref 2.0–3.0)

## 2022-03-02 NOTE — Patient Instructions (Signed)
Take warfarin 2 tablets today then increase dose to 1 tablet daily   Recheck in 3 weeks

## 2022-03-30 ENCOUNTER — Encounter: Payer: Self-pay | Admitting: Cardiology

## 2022-03-30 ENCOUNTER — Ambulatory Visit (INDEPENDENT_AMBULATORY_CARE_PROVIDER_SITE_OTHER): Payer: Medicare Other | Admitting: *Deleted

## 2022-03-30 ENCOUNTER — Ambulatory Visit: Payer: Medicare Other | Attending: Cardiology | Admitting: Cardiology

## 2022-03-30 VITALS — BP 138/60 | HR 70 | Ht 72.0 in | Wt 178.8 lb

## 2022-03-30 DIAGNOSIS — D6869 Other thrombophilia: Secondary | ICD-10-CM

## 2022-03-30 DIAGNOSIS — I5022 Chronic systolic (congestive) heart failure: Secondary | ICD-10-CM | POA: Diagnosis not present

## 2022-03-30 DIAGNOSIS — I2510 Atherosclerotic heart disease of native coronary artery without angina pectoris: Secondary | ICD-10-CM | POA: Diagnosis not present

## 2022-03-30 DIAGNOSIS — I4891 Unspecified atrial fibrillation: Secondary | ICD-10-CM

## 2022-03-30 DIAGNOSIS — E782 Mixed hyperlipidemia: Secondary | ICD-10-CM | POA: Diagnosis not present

## 2022-03-30 DIAGNOSIS — I10 Essential (primary) hypertension: Secondary | ICD-10-CM

## 2022-03-30 DIAGNOSIS — Z5181 Encounter for therapeutic drug level monitoring: Secondary | ICD-10-CM

## 2022-03-30 LAB — POCT INR: INR: 1.9 — AB (ref 2.0–3.0)

## 2022-03-30 NOTE — Progress Notes (Signed)
Clinical Summary [name] is a 71 y.o.male seen today for follow up of the following medical problems.      1.Chronic HFimpEF  - ehco 06/2015 LVEF 35-40% - Jan 2019 echo LVEF 50%. Abnormal diastolic function - 08/6501 echo LVEF 54%, indet diastolic, severe LAE - lasix '40mg'$  daily led to [facility], has done well with '40mg'$  alternating days with '20mg'$    - no recent leg swelling. No SOB/DOE - compliant with meds. Taking lasix '40mg'$  alternating days with '20mg'$  days.  - weight today 178 lbs, last visits 183 lbs.      2.HTN - compliant with meds  3. 4. PAD - s/p repair of juxtarenal abdominal aortic aneurysm, left renal artery bypass, followed by vascular - from 06/2017 note was to have f/u in 5 years     4.Afib - eliquis was too expensive, changed to coumadin.   - no palpitaions - no bleeding on coumadin     5. CAD - remote cath at [facility]. History of RCA CTO, received stent to OM1.  - no chest pains    6. HL - labs followed by pcp 01/2021 TC 112 TG 183 HDL 25 LDL 56         7. CKD IV - followed at [facility] - last labs in our system Jan 2023, Cr 3.4 and GFR 19    8. COPD Past Medical History:  Diagnosis Date   AAA (abdominal aortic aneurysm) ([place])    a. s/p repair 06/2015 with left renal artery bypass at that time, post-op course c/b renal failure requiring dialysis, C dif.   Anemia    Arteriosclerotic cardiovascular disease (ASCVD)    a. AMI in 2000 treated at [facility]. b. cath in 12/2006->  Chronic total obstruction of the RCA;  drug-eluting stent placed in OM1, LVEF abnormal.   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   PVD (peripheral vascular disease) ([place])    Ct angiogram in 2009 revealed stable disease with 80% celiac stenosis,50% right renal artery ,ASCVD with ulceration in the abdominal aortashe   Testicular carcinoma ([place]) 1990   right orchiectomy   Tobacco abuse, in remission    20 pack years; quit in 2009     No Known Allergies   Current Outpatient Medications  Medication Sig Dispense Refill   albuterol (PROVENTIL) (2.5 MG/3ML) 0.083% nebulizer solution USE 1 VIAL IN NEBULIZER EVERY 4 HOURS AS NEEDED FOR WHEEZING OR SHORTNESS OF BREATH 180 mL 0   albuterol (VENTOLIN HFA) 108 (90 Base) MCG/ACT inhaler Inhale 1-2 puffs into the lungs every 6 (six) hours as needed for wheezing or shortness of breath. 8 g 11   amLODipine (NORVASC) 10 MG tablet Take 1 tablet by mouth once daily 90 tablet 0   atorvastatin (LIPITOR) 20 MG tablet Take 1 tablet by mouth once daily 90 tablet 3   calcitRIOL (ROCALTROL) 0.25 MCG capsule Take 0.25 mcg by mouth daily.      fluticasone (  FLONASE) 50 MCG/ACT nasal spray Place 1 spray into both nostrils daily as needed for allergies or rhinitis.     Fluticasone-Umeclidin-Vilant (TRELEGY ELLIPTA) 100-62.5-25 MCG/ACT AEPB Inhale 1 puff into the lungs daily. 1 each 11   furosemide (LASIX) 40 MG tablet Take 0.5-1 tablets (20-40 mg total) by mouth daily. Alternate 1 whole tablet with 1/2 tablet daily 45 tablet 6   hydrALAZINE (APRESOLINE) 100 MG tablet TAKE 1 TABLET BY MOUTH THREE TIMES DAILY 270 tablet 0   metoprolol succinate (TOPROL-XL) 100 MG 24 hr tablet TAKE 1 & 1/2 (ONE & ONE-HALF) TABLETS BY MOUTH ONCE DAILY 135 tablet 2   nitroGLYCERIN (NITROSTAT) 0.4 MG SL tablet place 1 tablet under the tongue if needed every 5 minutes for chest pain for 3 doses IF NO RELIEF AFTER 3RD DOSE CALL PRESCRIBER OR 911. 25 tablet 2    pantoprazole (PROTONIX) 40 MG tablet Take 1 tablet by mouth once daily 90 tablet 0   tamsulosin (FLOMAX) 0.4 MG CAPS capsule TAKE 1 CAPSULE BY MOUTH ONCE DAILY AFTER SUPPER 90 capsule 0   warfarin (COUMADIN) 5 MG tablet TAKE 1/2 TO 1 TABLET BY MOUTH ONCE DAILY AS DIRECTED BY COUMADIN CLINIC 90 tablet 1   No current facility-administered medications for this visit.     Past Surgical History:  Procedure Laterality Date   ABDOMINAL AORTIC ANEURYSM REPAIR N/A 06/09/2015   Procedure: ANEURYSM ABDOMINAL AORTIC REPAIR;  Surgeon: [name] Dutch, MD;  Location: [place];  Service: Vascular;  Laterality: N/A;   AORTIC ENDARTERECETOMY N/A 06/09/2015   Procedure: AORTIC ENDARTERECETOMY;  Surgeon: [name] Dutch, MD;  Location: [place];  Service: Vascular;  Laterality: N/A;   AORTIC/RENAL BYPASS Left 06/09/2015   Procedure: LEFT RENAL Artery BYPASS;  Surgeon: [name] Dutch, MD;  Location: [place];  Service: Vascular;  Laterality: Left;   APPENDECTOMY  2004   CHOLECYSTECTOMY N/A 09/18/2018   Procedure: LAPAROSCOPIC CHOLECYSTECTOMY;  Surgeon: [name] Signs, MD;  [facility];  Service: General;  Laterality: N/A;   COLONOSCOPY  06/17/2011   INCOMPLETE, PREP POOR. Procedure: COLONOSCOPY;  Surgeon: [name] Dolin, MD;  [facility];  Service: Endoscopy;  Laterality: N/A;  10:00   COLONOSCOPY  07/15/2011   [id number] rectal and colon polyps   COLONOSCOPY N/A 05/22/2015   [id number] colonic and rectal polyps. tubular adenomas.repeat TCS 05/2018   ESOPHAGEAL DILATION N/A 05/22/2015   Procedure: ESOPHAGEAL DILATION;  Surgeon: [name] Dolin, MD;  [facility];  Service: Endoscopy;  Laterality: N/A;   ESOPHAGOGASTRODUODENOSCOPY  06/17/2011   severe erosive/ulcerative reflux esophagitis, soft noncritical stricture dilatied, small hh, antral erosion    ESOPHAGOGASTRODUODENOSCOPY N/A 05/22/2015   RMR: 2 cm HH otherwise normal. s/p empirical dilation.   INSERTION OF DIALYSIS CATHETER Left  06/26/2015   Procedure: INSERTION OF DIALYSIS CATHETER;  Surgeon: [name] Mould, MD;  Location: [place];  Service: Vascular;  Laterality: Left;   PERIPHERAL VASCULAR CATHETERIZATION N/A 05/28/2015   Procedure: Abdominal Aortogram;  Surgeon: Serafina Mitchell, MD;  Location: [place] CV LAB;  Service: Cardiovascular;  Laterality: N/A;   testicular cancer  1990   right orchiectomy     No Known Allergies    Family History  Problem Relation Age of Onset   Colon cancer Father 23       deceased   Prostate cancer Father    Heart disease Mother    Liver disease Neg Hx    Anesthesia problems Neg Hx    Hypotension  Neg Hx    Malignant hyperthermia Neg Hx    Pseudochol deficiency Neg Hx      Social History Mr. Shammas reports that he has been smoking cigarettes. He has a 20.00 pack-year smoking history. He has never used smokeless tobacco. Mr. Vanbenschoten reports no history of alcohol use.   Review of Systems CONSTITUTIONAL: No weight loss, fever, chills, weakness or fatigue.  HEENT: Eyes: No visual loss, blurred vision, double vision or yellow sclerae.No hearing loss, sneezing, congestion, runny nose or sore throat.  SKIN: No rash or itching.  CARDIOVASCULAR: per hpi RESPIRATORY: No shortness of breath, cough or sputum.  GASTROINTESTINAL: No anorexia, nausea, vomiting or diarrhea. No abdominal pain or blood.  GENITOURINARY: No burning on urination, no polyuria NEUROLOGICAL: No headache, dizziness, syncope, paralysis, ataxia, numbness or tingling in the extremities. No change in bowel or bladder control.  MUSCULOSKELETAL: No muscle, back pain, joint pain or stiffness.  LYMPHATICS: No enlarged nodes. No history of splenectomy.  PSYCHIATRIC: No history of depression or anxiety.  ENDOCRINOLOGIC: No reports of sweating, cold or heat intolerance. No polyuria or polydipsia.  [name]   Physical Examination Today's Vitals   03/30/22 0830  BP: 138/60  Pulse: 70  SpO2: 97%  Weight: 178 lb 12.8  oz (81.1 kg)  Height: 6' (1.829 m)   Body mass index is 24.25 kg/m.  Gen: resting comfortably, no acute distress HEENT: no scleral icterus, pupils equal round and reactive, no palptable cervical adenopathy,  CV: irreg, no m/r/g no jvd Resp: Clear to auscultation bilaterally GI: abdomen is soft, non-tender, non-distended, normal bowel sounds, no hepatosplenomegaly MSK: extremities are warm, no edema.  Skin: warm, no rash Neuro:  no focal deficits Psych: appropriate affect   Diagnostic Studies  06/2015 echo - Left ventricle: The cavity size was normal. Systolic function was   moderately reduced. The estimated ejection fraction was in the   range of 35% to 40%. - Pulmonary arteries: PA peak pressure: 33 mm Hg (S). - Inferior vena cava: The vessel was dilated. The respirophasic   diameter changes were blunted (< 50%). This is a nonspecific   finding during positive pressure ventilation.   Impressions:   - Technically limited study due to poor echo windows and   tachycardia. Consider TEE if more accurate evaluation is   necessary.     Jan 2019 echo  Study Conclusions   - Left ventricle: The cavity size was normal. Wall thickness was   increased in a pattern of mild LVH. Systolic function was at the   lower limits of normal. The estimated ejection fraction was 50%.   Diffuse hypokinesis. The study was not technically sufficient to   allow evaluation of LV diastolic dysfunction due to atrial   fibrillation. Doppler parameters are consistent with high   ventricular filling pressure. - Aortic valve: Trileaflet; mildly thickened leaflets. - Mitral valve: There was mild regurgitation. - Left atrium: The atrium was moderately dilated. - Right atrium: The atrium was moderately dilated.   Assessment and Plan  Chronic HFimpEF - no symptoms, he is euvolemic. Doing well on current lasix dosing - no SLGT2i given advanced kidney dysfynction  2. HTN - essentially at goal, continue  current meds. Just last monthy 112/61  3. Hyperlipidemia - LDL has been at goal, continue atorvastatin  4. CAD - no symptoms, continue current meds  5. Afib/acquired thrombophilia - no symptoms, cnotinue current meds including coumadin for stroke prevention. Eliquis was too expensive      Alphonse Guild.  Harl Bowie, M.D

## 2022-03-30 NOTE — Patient Instructions (Signed)
Medication Instructions:  Continue all current medications.   Labwork: none  Testing/Procedures: none  Follow-Up: 6 months   Any Other Special Instructions Will Be Listed Below (If Applicable).   If you need a refill on your cardiac medications before your next appointment, please call your pharmacy.  

## 2022-03-30 NOTE — Patient Instructions (Signed)
Increase warfarin to 1 tablet daily  except 1 1/2 tablets on Tuesdays Recheck in 4 weeks

## 2022-04-19 ENCOUNTER — Other Ambulatory Visit: Payer: Self-pay | Admitting: Internal Medicine

## 2022-04-23 ENCOUNTER — Other Ambulatory Visit: Payer: Self-pay | Admitting: Internal Medicine

## 2022-04-29 ENCOUNTER — Ambulatory Visit: Payer: Medicare Other | Attending: Cardiology | Admitting: *Deleted

## 2022-04-29 DIAGNOSIS — I4891 Unspecified atrial fibrillation: Secondary | ICD-10-CM | POA: Diagnosis not present

## 2022-04-29 DIAGNOSIS — Z5181 Encounter for therapeutic drug level monitoring: Secondary | ICD-10-CM | POA: Diagnosis not present

## 2022-04-29 LAB — POCT INR: INR: 3.1 — AB (ref 2.0–3.0)

## 2022-04-29 NOTE — Patient Instructions (Signed)
Continue warfarin 1 tablet daily  except 1 1/2 tablets on Tuesdays Recheck in 4 weeks Increase greens

## 2022-05-01 ENCOUNTER — Other Ambulatory Visit: Payer: Self-pay | Admitting: Cardiology

## 2022-05-27 ENCOUNTER — Ambulatory Visit: Payer: 59 | Attending: Cardiology | Admitting: *Deleted

## 2022-05-27 DIAGNOSIS — Z5181 Encounter for therapeutic drug level monitoring: Secondary | ICD-10-CM | POA: Diagnosis not present

## 2022-05-27 DIAGNOSIS — I4891 Unspecified atrial fibrillation: Secondary | ICD-10-CM | POA: Diagnosis not present

## 2022-05-27 LAB — POCT INR: INR: 2.4 (ref 2.0–3.0)

## 2022-05-27 NOTE — Patient Instructions (Signed)
Continue warfarin 1 tablet daily  except 1 1/2 tablets on Tuesdays Recheck in 4 weeks Continue greens

## 2022-06-07 ENCOUNTER — Other Ambulatory Visit: Payer: Self-pay | Admitting: Internal Medicine

## 2022-06-09 ENCOUNTER — Telehealth: Payer: Self-pay | Admitting: Cardiology

## 2022-06-09 ENCOUNTER — Other Ambulatory Visit: Payer: Self-pay | Admitting: Cardiology

## 2022-06-09 NOTE — Telephone Encounter (Signed)
Pt c/o medication issue:  1. Name of Medication: warfarin (COUMADIN) 5 MG tablet   2. How are you currently taking this medication (dosage and times per day)?   TAKE 1 TO 1 1/2 TABLETS BY MOUTH ONCE DAILY AS DIRECTED BY COUMADIN CLINIC    3. Are you having a reaction (difficulty breathing--STAT)? no  4. What is your medication issue? Calling to get permission to use a different company to get the medication. Please advise

## 2022-06-09 NOTE — Telephone Encounter (Signed)
Refill request for warfarin:  Last INR was 2.4 on 05/27/22 Next INR due 06/24/22 LOV was 03/30/22  Zandra Abts MD  Refill approved.

## 2022-06-09 NOTE — Telephone Encounter (Signed)
OK to change warfarin manufacturer.  Has INR appt on 06/24/22.

## 2022-06-24 ENCOUNTER — Other Ambulatory Visit: Payer: Self-pay | Admitting: Internal Medicine

## 2022-06-24 ENCOUNTER — Ambulatory Visit: Payer: 59 | Attending: Cardiology | Admitting: *Deleted

## 2022-06-24 DIAGNOSIS — Z5181 Encounter for therapeutic drug level monitoring: Secondary | ICD-10-CM | POA: Diagnosis not present

## 2022-06-24 DIAGNOSIS — I4891 Unspecified atrial fibrillation: Secondary | ICD-10-CM

## 2022-06-24 LAB — POCT INR: INR: 3 (ref 2.0–3.0)

## 2022-06-24 NOTE — Patient Instructions (Signed)
Continue warfarin 1 tablet daily  except 1 1/2 tablets on Tuesdays Recheck in 6 weeks Continue greens

## 2022-06-29 ENCOUNTER — Other Ambulatory Visit: Payer: Self-pay | Admitting: Internal Medicine

## 2022-07-12 ENCOUNTER — Other Ambulatory Visit: Payer: Self-pay | Admitting: Cardiology

## 2022-07-21 ENCOUNTER — Other Ambulatory Visit: Payer: Self-pay | Admitting: Internal Medicine

## 2022-08-05 ENCOUNTER — Ambulatory Visit: Payer: 59 | Attending: Cardiology | Admitting: *Deleted

## 2022-08-05 DIAGNOSIS — Z5181 Encounter for therapeutic drug level monitoring: Secondary | ICD-10-CM | POA: Diagnosis not present

## 2022-08-05 DIAGNOSIS — I4891 Unspecified atrial fibrillation: Secondary | ICD-10-CM | POA: Diagnosis not present

## 2022-08-05 LAB — POCT INR: INR: 2.4 (ref 2.0–3.0)

## 2022-08-05 NOTE — Patient Instructions (Signed)
Continue warfarin 1 tablet daily  except 1 1/2 tablets on Tuesdays Recheck in 6 weeks Continue greens 

## 2022-08-07 ENCOUNTER — Other Ambulatory Visit: Payer: Self-pay | Admitting: Internal Medicine

## 2022-08-11 ENCOUNTER — Ambulatory Visit: Payer: 59 | Admitting: Internal Medicine

## 2022-08-13 ENCOUNTER — Other Ambulatory Visit: Payer: Self-pay | Admitting: Internal Medicine

## 2022-08-13 DIAGNOSIS — J209 Acute bronchitis, unspecified: Secondary | ICD-10-CM

## 2022-09-03 ENCOUNTER — Other Ambulatory Visit: Payer: Self-pay | Admitting: Cardiology

## 2022-09-15 ENCOUNTER — Other Ambulatory Visit: Payer: Self-pay | Admitting: Internal Medicine

## 2022-09-16 ENCOUNTER — Ambulatory Visit: Payer: 59 | Attending: Cardiology | Admitting: *Deleted

## 2022-09-16 DIAGNOSIS — I4891 Unspecified atrial fibrillation: Secondary | ICD-10-CM

## 2022-09-16 DIAGNOSIS — Z5181 Encounter for therapeutic drug level monitoring: Secondary | ICD-10-CM

## 2022-09-16 LAB — POCT INR: POC INR: 4.8

## 2022-09-16 NOTE — Patient Instructions (Addendum)
Description   Hold warfarin today and tomorrow, then continue warfarin 1 tablet daily  except 1 1/2 tablets on Tuesdays. Recheck INR in 1 week.

## 2022-09-22 ENCOUNTER — Ambulatory Visit: Payer: Medicare Other | Admitting: Cardiology

## 2022-09-22 NOTE — Progress Notes (Deleted)
Clinical Summary Mr. Worsham is a 72 y.o.male  seen today for follow up of the following medical problems.      1.Chronic HFimpEF  - ehco 06/2015 LVEF 35-40% - Jan 2019 echo LVEF 50%. Abnormal diastolic function - 08/2019 echo LVEF 55%, indet diastolic, severe LAE - lasix 40mg  daily led to [facility], has done well with 40mg  alternating days with 20mg    - no recent leg swelling. No SOB/DOE - compliant with meds. Taking lasix 40mg  alternating days with 20mg  days.  - weight today 178 lbs, last visits 183 lbs.      2.HTN - compliant with meds   3. 4. PAD - s/p repair of juxtarenal abdominal aortic aneurysm, left renal artery bypass, followed by vascular - from 06/2017 note was to have f/u in 5 years       4.Afib - eliquis was too expensive, changed to coumadin.    - no palpitaions - no bleeding on coumadin     5. CAD - remote cath at [facility]. History of RCA CTO, received stent to OM1.  - no chest pains     6. HL - labs followed by pcp 01/2021 TC 112 TG 183 HDL 25 LDL 56         7. CKD IV - followed at [facility] - last labs in our system Jan 2023, Cr 3.4 and GFR 19    8. COPD Past Medical History:  Diagnosis Date   AAA (abdominal aortic aneurysm) (HCC)    a. s/p repair 06/2015 with left renal artery bypass at that time, post-op course c/b renal failure requiring dialysis, C dif.   Anemia    Arteriosclerotic cardiovascular disease (ASCVD)    a. AMI in 2000 treated at [facility]. b. cath in 12/2006->  Chronic total obstruction of the RCA;  drug-eluting stent placed in OM1, LVEF abnormal.   Calculus of gallbladder with acute cholecystitis without obstruction    Cerebrovascular disease 2002   carotid stent   Chronic anticoagulation    Chronic combined systolic and diastolic CHF (congestive heart failure) (HCC)    CKD (chronic kidney disease), stage IV (HCC)    COPD (chronic obstructive pulmonary disease) (HCC)    GERD (gastroesophageal reflux disease)     Hyperlipidemia    Hypertension    Myocardial infarction (HCC) 10 yrs ago   Nephrolithiasis    Permanent atrial fibrillation (HCC)    Pseudoaneurysm of aorta (HCC) 06/09/2015   PVD (peripheral vascular disease) (HCC)    Ct angiogram in 2009 revealed stable disease with 80% celiac stenosis,50% right renal artery ,ASCVD with ulceration in the abdominal aortashe   Testicular carcinoma (HCC) 1990   right orchiectomy   Tobacco abuse, in remission    20 pack years; quit in 2009     No Known Allergies   Current Outpatient Medications  Medication Sig Dispense Refill   albuterol (PROVENTIL) (2.5 MG/3ML) 0.083% nebulizer solution USE 1 VIAL IN NEBULIZER EVERY 4 HOURS AS NEEDED FOR WHEEZING OR SHORTNESS OF BREATH 180 mL 0   albuterol (VENTOLIN HFA) 108 (90 Base) MCG/ACT inhaler INHALE 1 TO 2 PUFFS BY MOUTH EVERY 6 HOURS AS NEEDED FOR WHEEZING FOR SHORTNESS OF BREATH 9 g 0   amLODipine (NORVASC) 10 MG tablet Take 1 tablet by mouth once daily 90 tablet 0   atorvastatin (LIPITOR) 20 MG tablet Take 1 tablet by mouth once daily 90 tablet 0   calcitRIOL (ROCALTROL) 0.25 MCG capsule Take 0.25 mcg by mouth daily.  fluticasone (FLONASE) 50 MCG/ACT nasal spray Place 1 spray into both nostrils daily as needed for allergies or rhinitis.     Fluticasone-Umeclidin-Vilant (TRELEGY ELLIPTA) 100-62.5-25 MCG/ACT AEPB Inhale 1 puff into the lungs daily. 1 each 11   furosemide (LASIX) 40 MG tablet TAKE 1/2 TO 1 (ONE-HALF TO ONE) TABLET BY MOUTH ONCE DAILY. ALTERNATE 1 WHOLE TABLET WITH 1/2 TABLET DAILY 45 tablet 0   hydrALAZINE (APRESOLINE) 100 MG tablet TAKE 1 TABLET BY MOUTH THREE TIMES DAILY 270 tablet 1   metoprolol succinate (TOPROL-XL) 100 MG 24 hr tablet TAKE 1 & 1/2 (ONE & ONE-HALF) TABLETS BY MOUTH ONCE DAILY 135 tablet 2   nitroGLYCERIN (NITROSTAT) 0.4 MG SL tablet place 1 tablet under the tongue if needed every 5 minutes for chest pain for 3 doses IF NO RELIEF AFTER 3RD DOSE CALL PRESCRIBER OR 911. 25  tablet 2   pantoprazole (PROTONIX) 40 MG tablet Take 1 tablet by mouth once daily 90 tablet 0   tamsulosin (FLOMAX) 0.4 MG CAPS capsule TAKE 1 CAPSULE BY MOUTH ONCE DAILY AFTER SUPPER 90 capsule 0   warfarin (COUMADIN) 5 MG tablet TAKE 1 TO 1 1/2  TABLETS BY MOUTH ONCE DAILY AS  DIRECTED  BY  COUMADIN  CLINIC 100 tablet 1   No current facility-administered medications for this visit.     Past Surgical History:  Procedure Laterality Date   ABDOMINAL AORTIC ANEURYSM REPAIR N/A 06/09/2015   Procedure: ANEURYSM ABDOMINAL AORTIC REPAIR;  Surgeon: Sherren Kerns, MD;  Location: [facility] OR;  Service: Vascular;  Laterality: N/A;   AORTIC ENDARTERECETOMY N/A 06/09/2015   Procedure: AORTIC ENDARTERECETOMY;  Surgeon: Sherren Kerns, MD;  Location: [facility] OR;  Service: Vascular;  Laterality: N/A;   AORTIC/RENAL BYPASS Left 06/09/2015   Procedure: LEFT RENAL Artery BYPASS;  Surgeon: Sherren Kerns, MD;  Location: [facility] OR;  Service: Vascular;  Laterality: Left;   APPENDECTOMY  2004   CHOLECYSTECTOMY N/A 09/18/2018   Procedure: LAPAROSCOPIC CHOLECYSTECTOMY;  Surgeon: Franky Macho, MD;  [facility];  Service: General;  Laterality: N/A;   COLONOSCOPY  06/17/2011   INCOMPLETE, PREP POOR. Procedure: COLONOSCOPY;  Surgeon: Corbin Ade, MD;  [facility];  Service: Endoscopy;  Laterality: N/A;  10:00   COLONOSCOPY  07/15/2011   [id number] rectal and colon polyps   COLONOSCOPY N/A 05/22/2015   [id number] colonic and rectal polyps. tubular adenomas.repeat TCS 05/2018   ESOPHAGEAL DILATION N/A 05/22/2015   Procedure: ESOPHAGEAL DILATION;  Surgeon: Corbin Ade, MD;  [facility];  Service: Endoscopy;  Laterality: N/A;   ESOPHAGOGASTRODUODENOSCOPY  06/17/2011   severe erosive/ulcerative reflux esophagitis, soft noncritical stricture dilatied, small hh, antral erosion    ESOPHAGOGASTRODUODENOSCOPY N/A 05/22/2015   RMR: 2 cm HH otherwise normal. s/p empirical dilation.   INSERTION OF DIALYSIS  CATHETER Left 06/26/2015   Procedure: INSERTION OF DIALYSIS CATHETER;  Surgeon: Chuck Hint, MD;  Location: [facility] OR;  Service: Vascular;  Laterality: Left;   PERIPHERAL VASCULAR CATHETERIZATION N/A 05/28/2015   Procedure: Abdominal Aortogram;  Surgeon: Nada Libman, MD;  [facility];  Service: Cardiovascular;  Laterality: N/A;   testicular cancer  1990   right orchiectomy     No Known Allergies    Family History  Problem Relation Age of Onset   Colon cancer Father 94       deceased   Prostate cancer Father    Heart disease Mother    Liver disease Neg Hx  Anesthesia problems Neg Hx    Hypotension Neg Hx    Malignant hyperthermia Neg Hx    Pseudochol deficiency Neg Hx      Social History Mr. Look reports that he has been smoking cigarettes. He has a 20.00 pack-year smoking history. He has never used smokeless tobacco. Mr. Simonian reports no history of alcohol use.   Review of Systems CONSTITUTIONAL: No weight loss, fever, chills, weakness or fatigue.  HEENT: Eyes: No visual loss, blurred vision, double vision or yellow sclerae.No hearing loss, sneezing, congestion, runny nose or sore throat.  SKIN: No rash or itching.  CARDIOVASCULAR:  RESPIRATORY: No shortness of breath, cough or sputum.  GASTROINTESTINAL: No anorexia, nausea, vomiting or diarrhea. No abdominal pain or blood.  GENITOURINARY: No burning on urination, no polyuria NEUROLOGICAL: No headache, dizziness, syncope, paralysis, ataxia, numbness or tingling in the extremities. No change in bowel or bladder control.  MUSCULOSKELETAL: No muscle, back pain, joint pain or stiffness.  LYMPHATICS: No enlarged nodes. No history of splenectomy.  PSYCHIATRIC: No history of depression or anxiety.  ENDOCRINOLOGIC: No reports of sweating, cold or heat intolerance. No polyuria or polydipsia.  [name]   Physical Examination There were no vitals filed for this visit. There were no vitals filed for this  visit.  Gen: resting comfortably, no acute distress HEENT: no scleral icterus, pupils equal round and reactive, no palptable cervical adenopathy,  CV Resp: Clear to auscultation bilaterally GI: abdomen is soft, non-tender, non-distended, normal bowel sounds, no hepatosplenomegaly MSK: extremities are warm, no edema.  Skin: warm, no rash Neuro:  no focal deficits Psych: appropriate affect   Diagnostic Studies  06/2015 echo - Left ventricle: The cavity size was normal. Systolic function was   moderately reduced. The estimated ejection fraction was in the   range of 35% to 40%. - Pulmonary arteries: PA peak pressure: 33 mm Hg (S). - Inferior vena cava: The vessel was dilated. The respirophasic   diameter changes were blunted (< 50%). This is a nonspecific   finding during positive pressure ventilation.   Impressions:   - Technically limited study due to poor echo windows and   tachycardia. Consider TEE if more accurate evaluation is   necessary.     Jan 2019 echo  Study Conclusions   - Left ventricle: The cavity size was normal. Wall thickness was   increased in a pattern of mild LVH. Systolic function was at the   lower limits of normal. The estimated ejection fraction was 50%.   Diffuse hypokinesis. The study was not technically sufficient to   allow evaluation of LV diastolic dysfunction due to atrial   fibrillation. Doppler parameters are consistent with high   ventricular filling pressure. - Aortic valve: Trileaflet; mildly thickened leaflets. - Mitral valve: There was mild regurgitation. - Left atrium: The atrium was moderately dilated. - Right atrium: The atrium was moderately dilated.     Assessment and Plan   Chronic HFimpEF - no symptoms, he is euvolemic. Doing well on current lasix dosing - no SLGT2i given advanced kidney dysfynction   2. HTN - essentially at goal, continue current meds. Just last monthy 112/61   3. Hyperlipidemia - LDL has been at  goal, continue atorvastatin   4. CAD - no symptoms, continue current meds   5. Afib/acquired thrombophilia - no symptoms, cnotinue current meds including coumadin for stroke prevention. Eliquis was too expensive     Antoine Poche, M.D., F.A.C.C.

## 2022-09-23 ENCOUNTER — Ambulatory Visit: Payer: 59 | Attending: Cardiology | Admitting: Nurse Practitioner

## 2022-09-23 ENCOUNTER — Encounter: Payer: Self-pay | Admitting: Cardiology

## 2022-09-23 ENCOUNTER — Encounter: Payer: Self-pay | Admitting: Nurse Practitioner

## 2022-09-23 ENCOUNTER — Ambulatory Visit (INDEPENDENT_AMBULATORY_CARE_PROVIDER_SITE_OTHER): Payer: 59 | Admitting: *Deleted

## 2022-09-23 ENCOUNTER — Telehealth: Payer: Self-pay | Admitting: Cardiology

## 2022-09-23 VITALS — BP 154/64 | HR 68 | Ht 72.0 in | Wt 174.6 lb

## 2022-09-23 DIAGNOSIS — E785 Hyperlipidemia, unspecified: Secondary | ICD-10-CM | POA: Diagnosis not present

## 2022-09-23 DIAGNOSIS — I4819 Other persistent atrial fibrillation: Secondary | ICD-10-CM | POA: Diagnosis not present

## 2022-09-23 DIAGNOSIS — I4891 Unspecified atrial fibrillation: Secondary | ICD-10-CM

## 2022-09-23 DIAGNOSIS — J449 Chronic obstructive pulmonary disease, unspecified: Secondary | ICD-10-CM | POA: Diagnosis not present

## 2022-09-23 DIAGNOSIS — I739 Peripheral vascular disease, unspecified: Secondary | ICD-10-CM

## 2022-09-23 DIAGNOSIS — Z5181 Encounter for therapeutic drug level monitoring: Secondary | ICD-10-CM

## 2022-09-23 DIAGNOSIS — I10 Essential (primary) hypertension: Secondary | ICD-10-CM

## 2022-09-23 DIAGNOSIS — I2510 Atherosclerotic heart disease of native coronary artery without angina pectoris: Secondary | ICD-10-CM

## 2022-09-23 DIAGNOSIS — N184 Chronic kidney disease, stage 4 (severe): Secondary | ICD-10-CM

## 2022-09-23 DIAGNOSIS — Z9889 Other specified postprocedural states: Secondary | ICD-10-CM

## 2022-09-23 DIAGNOSIS — I5032 Chronic diastolic (congestive) heart failure: Secondary | ICD-10-CM | POA: Diagnosis not present

## 2022-09-23 LAB — POCT INR: INR: 3 (ref 2.0–3.0)

## 2022-09-23 NOTE — Progress Notes (Signed)
Office Visit    Patient Name: [name] [name] Date of Encounter: 09/23/2022  PCP:  Anabel Halon, MD   [facility]  Cardiologist:  Dina Rich, MD  Advanced Practice Provider:  No care team member to display Electrophysiologist:  None   Chief Complaint    [name] [name] is a 72 y.o. male with a hx of heart failure with improved ejection fraction, hypertension, CAD, hyperlipidemia, AAA, s/p repair in 2017 with left renal artery bypass at the time, PAD, A-fib, CKD stage IV, and COPD, who presents today for 5-month follow-up.  Past Medical History    Past Medical History:  Diagnosis Date   AAA (abdominal aortic aneurysm) (HCC)    a. s/p repair 06/2015 with left renal artery bypass at that time, post-op course c/b renal failure requiring dialysis, C dif.   Anemia    Arteriosclerotic cardiovascular disease (ASCVD)    a. AMI in 2000 treated at [facility]. b. cath in 12/2006->  Chronic total obstruction of the RCA;  drug-eluting stent placed in OM1, LVEF abnormal.   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   PVD (peripheral vascular disease) (HCC)    Ct angiogram in 2009 revealed stable disease with 80% celiac stenosis,50% right renal artery ,ASCVD with ulceration in the abdominal aortashe   Testicular carcinoma (HCC) 1990   right orchiectomy   Tobacco abuse, in remission    20 pack years; quit in 2009   Past Surgical History:  Procedure Laterality Date    ABDOMINAL AORTIC ANEURYSM REPAIR N/A 06/09/2015   Procedure: ANEURYSM ABDOMINAL AORTIC REPAIR;  Surgeon: Sherren Kerns, MD;  Location: [facility] OR;  Service: Vascular;  Laterality: N/A;   AORTIC ENDARTERECETOMY N/A 06/09/2015   Procedure: AORTIC ENDARTERECETOMY;  Surgeon: Sherren Kerns, MD;  Location: [facility] OR;  Service: Vascular;  Laterality: N/A;   AORTIC/RENAL BYPASS Left 06/09/2015   Procedure: LEFT RENAL Artery BYPASS;  Surgeon: Sherren Kerns, MD;  Location: [facility] OR;  Service: Vascular;  Laterality: Left;   APPENDECTOMY  2004   CHOLECYSTECTOMY N/A 09/18/2018   Procedure: LAPAROSCOPIC CHOLECYSTECTOMY;  Surgeon: Franky Macho, MD;  Location: [facility] ORS;  Service: General;  Laterality: N/A;   COLONOSCOPY  06/17/2011   INCOMPLETE, PREP POOR. Procedure: COLONOSCOPY;  Surgeon: Corbin Ade, MD;  Location: [facility] ENDO SUITE;  Service: Endoscopy;  Laterality: N/A;  10:00   COLONOSCOPY  07/15/2011   [id number] rectal and colon polyps   COLONOSCOPY N/A 05/22/2015   [id number]:[id number] colonic and rectal polyps. tubular adenomas.repeat TCS 05/2018   ESOPHAGEAL DILATION N/A 05/22/2015   Procedure: ESOPHAGEAL DILATION;  Surgeon: Corbin Ade, MD;  Location: [facility] ENDO SUITE;  Service: Endoscopy;  Laterality: N/A;   ESOPHAGOGASTRODUODENOSCOPY  06/17/2011   severe erosive/ulcerative reflux esophagitis, soft noncritical stricture dilatied, small hh, antral erosion    ESOPHAGOGASTRODUODENOSCOPY N/A 05/22/2015   RMR: 2 cm HH otherwise normal. s/p empirical dilation.   INSERTION OF DIALYSIS CATHETER Left 06/26/2015  Procedure: INSERTION OF DIALYSIS CATHETER;  Surgeon: Chuck Hint, MD;  Location: [facility] OR;  Service: Vascular;  Laterality: Left;   PERIPHERAL VASCULAR CATHETERIZATION N/A 05/28/2015   Procedure: Abdominal Aortogram;  Surgeon: Nada Libman, MD;  Location: [facility] INVASIVE CV LAB;  Service: Cardiovascular;  Laterality: N/A;   testicular cancer  1990   right orchiectomy    Allergies  No Known Allergies  History  of Present Illness    [name] [name] is a 72 y.o. male with a PMH as mentioned above.  Previous cardiovascular history of remote cath at [facility].  History of CTO of RCA, received stent to OM1.  Echocardiogram in 2021 revealed EF 50 to 55%, no RWMA, moderate concentric LVH, elevated LVEDP, mild MR, BAE.   Last seen by Dr. Dina Rich on March 30, 2022.  Was doing well at the time.  Today he presents for 71-month follow-up.  He states he is doing well. Denies any chest pain, shortness of breath, palpitations, syncope, presyncope, dizziness, orthopnea, PND, swelling or significant weight changes, acute bleeding, or claudication.  EKGs/Labs/Other Studies Reviewed:   The following studies were reviewed today:   EKG:  EKG is ordered today.  The ekg ordered today demonstrates A-fib, 61 bpm, incomplete LBBB.   Echo 08/2019: 1. Left ventricular ejection fraction, by estimation, is 50 to 55%. The  left ventricle has low normal function. The left ventricle has no regional  wall motion abnormalities. There is moderate concentric left ventricular  hypertrophy. Left ventricular  diastolic parameters are indeterminate. Elevated left ventricular  end-diastolic pressure.   2. Right ventricular systolic function is normal. The right ventricular  size is normal.   3. Left atrial size was severely dilated.   4. Right atrial size was mild to moderately dilated.   5. The mitral valve is grossly normal. Mild mitral valve regurgitation.   6. The aortic valve is tricuspid. Aortic valve regurgitation is trivial.  No aortic stenosis is present.   7. The inferior vena cava is dilated in size with >50% respiratory  variability, suggesting right atrial pressure of 8 mmHg.  Recent Labs: No results found for requested labs within last 365 days.  Recent Lipid Panel    Component Value Date/Time   CHOL 112 02/17/2021 1152   TRIG 183 (H) 02/17/2021 1152   HDL 25 (L) 02/17/2021  1152   CHOLHDL 4.5 02/17/2021 1152   CHOLHDL 4.8 01/22/2020 1501   VLDL 52 (H) 12/28/2016 1017   LDLCALC 56 02/17/2021 1152   LDLCALC 78 01/22/2020 1501    Risk Assessment/Calculations:   CHA2DS2-VASc Score = 4  This indicates a 4.8% annual risk of stroke. The patient's score is based upon: CHF History: 1 HTN History: 1 Diabetes History: 0 Stroke History: 0 Vascular Disease History: 1 Age Score: 1 Gender Score: 0  Home Medications   Current Meds  Medication Sig   albuterol (PROVENTIL) (2.5 MG/3ML) 0.083% nebulizer solution USE 1 VIAL IN NEBULIZER EVERY 4 HOURS AS NEEDED FOR WHEEZING OR SHORTNESS OF BREATH   albuterol (VENTOLIN HFA) 108 (90 Base) MCG/ACT inhaler INHALE 1 TO 2 PUFFS BY MOUTH EVERY 6 HOURS AS NEEDED FOR WHEEZING FOR SHORTNESS OF BREATH   amLODipine (NORVASC) 10 MG tablet Take 1 tablet by mouth once daily   atorvastatin (LIPITOR) 20 MG tablet Take 1 tablet by mouth once daily   calcitRIOL (ROCALTROL) 0.25 MCG capsule Take 0.25 mcg by mouth daily.    fluticasone (FLONASE) 50 MCG/ACT nasal  spray Place 1 spray into both nostrils daily as needed for allergies or rhinitis.   Fluticasone-Umeclidin-Vilant (TRELEGY ELLIPTA) 100-62.5-25 MCG/ACT AEPB Inhale 1 puff into the lungs daily.   furosemide (LASIX) 40 MG tablet TAKE 1/2 TO 1 (ONE-HALF TO ONE) TABLET BY MOUTH ONCE DAILY. ALTERNATE 1 WHOLE TABLET WITH 1/2 TABLET DAILY   hydrALAZINE (APRESOLINE) 100 MG tablet TAKE 1 TABLET BY MOUTH THREE TIMES DAILY   metoprolol succinate (TOPROL-XL) 100 MG 24 hr tablet TAKE 1 & 1/2 (ONE & ONE-HALF) TABLETS BY MOUTH ONCE DAILY   nitroGLYCERIN (NITROSTAT) 0.4 MG SL tablet place 1 tablet under the tongue if needed every 5 minutes for chest pain for 3 doses IF NO RELIEF AFTER 3RD DOSE CALL PRESCRIBER OR 911.   pantoprazole (PROTONIX) 40 MG tablet Take 1 tablet by mouth once daily   tamsulosin (FLOMAX) 0.4 MG CAPS capsule TAKE 1 CAPSULE BY MOUTH ONCE DAILY AFTER SUPPER   warfarin (COUMADIN)  5 MG tablet TAKE 1 TO 1 1/2  TABLETS BY MOUTH ONCE DAILY AS  DIRECTED  BY  COUMADIN  CLINIC     Review of Systems    All other systems reviewed and are otherwise negative except as noted above.  Physical Exam    VS:  BP (!) 154/64   Pulse 68   Ht 6' (1.829 m)   Wt 174 lb 9.6 oz (79.2 kg)   SpO2 96%   BMI 23.68 kg/m  , BMI Body mass index is 23.68 kg/m.  Wt Readings from Last 3 Encounters:  09/23/22 174 lb 9.6 oz (79.2 kg)  03/30/22 178 lb 12.8 oz (81.1 kg)  02/22/22 178 lb 6.4 oz (80.9 kg)    Repeat BP: 144/61   GEN: Well nourished, well developed, in no acute distress. HEENT: normal. Neck: Supple, no JVD, carotid bruits, or masses. Cardiac: S1/S2, irregular rhythm and regular rate, no murmurs, rubs, or gallops. No clubbing, cyanosis, edema.  Radials/PT 2+ and equal bilaterally.  Respiratory:  Respirations regular and unlabored, clear to auscultation bilaterally. MS: No deformity or atrophy. Skin: Warm and dry, no rash. Neuro:  Strength and sensation are intact. Psych: Normal affect.  Assessment & Plan    HFimpEF Stage C, NYHA class I-II symptoms. Echo 08/2019 revealed EF improved to 50-55%. Euvolemic and well compensated on exam.  Continue Lasix, hydralazine, and metoprolol succinate. Low sodium diet, fluid restriction <2L, and daily weights encouraged. Educated to contact our office for weight gain of 2 lbs overnight or 5 lbs in one week.  HTN Blood pressure on arrival 154/64, repeat BP 144/61.  Discussed SBP goal is less than 140. Discussed to monitor BP at home at least 2 hours after medications and sitting for 5-10 minutes.  Continue amlodipine, hydralazine, and metoprolol succinate.  Discussed to notify office if SBP is not at goal in the next few weeks.  He verbalized understanding.  Recommended that he contact his nephrologist office for lab work and blood pressure medication titration, appreciate recs - see below. Heart healthy diet and regular cardiovascular exercise  encouraged.   A-fib Denies any tachycardia or palpitations.  Heart rate well-controlled today.  Continue metoprolol succinate.  Tolerating Coumadin well, denies any bleeding issues.  Continue to follow-up at Coumadin clinic.   CAD Stable with no anginal symptoms.  No indication for ischemic evaluation.  Not on aspirin due to being on Coumadin.  Continue atorvastatin, metoprolol succinate, and nitroglycerin as needed. Heart healthy diet and regular cardiovascular exercise encouraged.   PAD, HLD Arterial  duplex on March 2023 showed patent upper extremity arteries and patent cephalic and basilic veins.  Denies any symptoms.  Continue atorvastatin, labs being managed by PCP. Heart healthy diet and regular cardiovascular exercise encouraged.  Continue follow-up with Dr. Arbie Cookey.  AAA, s/p repair in 2017 with left renal artery bypass at the time Denies any issues.  Continue to follow-up with Dr. Arbie Cookey and PCP.  CKD stage IV  Has upcoming labs with PCP.  Continue to follow-up with Dr. Wolfgang Phoenix and PCP.  Recommended to follow-up with nephrology as he is due for labs and needs titration of blood pressure medication.  GDMT limited due to severe chronic kidney disease.  Avoid nephrotoxic agents.  No medication changes at this time.   COPD Denies any recent acute exacerbations or worsening symptoms.  No medication changes.  Continue follow-up with PCP.   Disposition: Follow up in 3 month(s) with Dina Rich, MD or APP.  Signed, Sharlene Dory, NP 09/25/2022, 2:44 PM [facility]

## 2022-09-23 NOTE — Patient Instructions (Signed)
Continue warfarin 1 tablet daily  except 1 1/2 tablets on Tuesdays. Recheck INR in 3 weeks.

## 2022-09-23 NOTE — Telephone Encounter (Signed)
Error

## 2022-09-23 NOTE — Telephone Encounter (Signed)
Patients wife is calling stating she's returning a call she received from the office. Was unable to find documentation of who called.   Please advise.

## 2022-09-23 NOTE — Patient Instructions (Addendum)
Medication Instructions:  Your physician recommends that you continue on your current medications as directed. Please refer to the Current Medication list given to you today.  Labwork: none  Testing/Procedures: none  Follow-Up: Your physician recommends that you schedule a follow-up appointment in: 3 months   Any Other Special Instructions Will Be Listed Below (If Applicable). Please contact Dr. Wolfgang Phoenix (Nephrology) [phone]) for a follow up appointment as soon as possible. Your physician has requested that you regularly monitor and record your blood pressure readings at home. Please use the same machine at the same time of day to check your readings and record them to bring to your follow-up visit.   If you need a refill on your cardiac medications before your next appointment, please call your pharmacy.

## 2022-10-12 ENCOUNTER — Ambulatory Visit (INDEPENDENT_AMBULATORY_CARE_PROVIDER_SITE_OTHER): Payer: 59 | Admitting: Internal Medicine

## 2022-10-12 ENCOUNTER — Encounter: Payer: Self-pay | Admitting: Internal Medicine

## 2022-10-12 VITALS — BP 121/57 | HR 69 | Ht 72.0 in | Wt 174.6 lb

## 2022-10-12 DIAGNOSIS — R739 Hyperglycemia, unspecified: Secondary | ICD-10-CM

## 2022-10-12 DIAGNOSIS — I10 Essential (primary) hypertension: Secondary | ICD-10-CM

## 2022-10-12 DIAGNOSIS — Z122 Encounter for screening for malignant neoplasm of respiratory organs: Secondary | ICD-10-CM | POA: Diagnosis not present

## 2022-10-12 DIAGNOSIS — Z0001 Encounter for general adult medical examination with abnormal findings: Secondary | ICD-10-CM | POA: Diagnosis not present

## 2022-10-12 DIAGNOSIS — E559 Vitamin D deficiency, unspecified: Secondary | ICD-10-CM

## 2022-10-12 DIAGNOSIS — I2510 Atherosclerotic heart disease of native coronary artery without angina pectoris: Secondary | ICD-10-CM | POA: Diagnosis not present

## 2022-10-12 DIAGNOSIS — N184 Chronic kidney disease, stage 4 (severe): Secondary | ICD-10-CM

## 2022-10-12 DIAGNOSIS — I480 Paroxysmal atrial fibrillation: Secondary | ICD-10-CM | POA: Diagnosis not present

## 2022-10-12 DIAGNOSIS — K219 Gastro-esophageal reflux disease without esophagitis: Secondary | ICD-10-CM

## 2022-10-12 DIAGNOSIS — N401 Enlarged prostate with lower urinary tract symptoms: Secondary | ICD-10-CM

## 2022-10-12 DIAGNOSIS — I5042 Chronic combined systolic (congestive) and diastolic (congestive) heart failure: Secondary | ICD-10-CM | POA: Diagnosis not present

## 2022-10-12 DIAGNOSIS — R351 Nocturia: Secondary | ICD-10-CM

## 2022-10-12 MED ORDER — TAMSULOSIN HCL 0.4 MG PO CAPS: 0.4000 mg | ORAL_CAPSULE | Freq: Every day | ORAL | 3 refills | Status: DC

## 2022-10-12 MED ORDER — PANTOPRAZOLE SODIUM 40 MG PO TBEC: 40.0000 mg | DELAYED_RELEASE_TABLET | Freq: Every day | ORAL | 3 refills | Status: DC

## 2022-10-12 MED ORDER — AMLODIPINE BESYLATE 10 MG PO TABS: 10.0000 mg | ORAL_TABLET | Freq: Every day | ORAL | 3 refills | Status: DC

## 2022-10-12 NOTE — Progress Notes (Signed)
Established Patient Office Visit  Subjective:  Patient ID: [name] [name], male    DOB: 1950/07/02  Age: 72 y.o. MRN: [id number]  CC:  Chief Complaint  Patient presents with   Hypertension   Annual Exam    HPI [name] [name] is a 72 y.o. male with past medical history of CAD s/p stent placement, AAA s/p repair, paroxysmal A Fib, CKD stage 4, HTN, COPD, GERD and HLD who presents for annual physical.  HTN and A Fib: His BP was well controlled today.  He denies any chest pain, dyspnea or palpitations currently.  He follows up with cardiology for history of CHF and nephrology for history of CKD.  He takes metoprolol and Coumadin for A-fib.  COPD: He states that his breathing is better now.  He has not required albuterol recently.  He was placed on Trelegy, but has not been compliant to it.  Denies any dyspnea or wheezing currently.   He needs fasting blood tests done, ordered today.   Past Medical History:  Diagnosis Date   AAA (abdominal aortic aneurysm) (HCC)    a. s/p repair 06/2015 with left renal artery bypass at that time, post-op course c/b renal failure requiring dialysis, C dif.   Anemia    Arteriosclerotic cardiovascular disease (ASCVD)    a. AMI in 2000 treated at [facility]. b. cath in 12/2006->  Chronic total obstruction of the RCA;  drug-eluting stent placed in OM1, LVEF abnormal.   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   PVD (peripheral vascular disease) (HCC)    Ct  angiogram in 2009 revealed stable disease with 80% celiac stenosis,50% right renal artery ,ASCVD with ulceration in the abdominal aortashe   Testicular carcinoma (HCC) 1990   right orchiectomy   Tobacco abuse, in remission    20 pack years; quit in 2009    Past Surgical History:  Procedure Laterality Date   ABDOMINAL AORTIC ANEURYSM REPAIR N/A 06/09/2015   Procedure: ANEURYSM ABDOMINAL AORTIC REPAIR;  Surgeon: Sherren Kerns, MD;  Location: [facility] OR;  Service: Vascular;  Laterality: N/A;   AORTIC ENDARTERECETOMY N/A 06/09/2015   Procedure: AORTIC ENDARTERECETOMY;  Surgeon: Sherren Kerns, MD;  Location: [facility] OR;  Service: Vascular;  Laterality: N/A;   AORTIC/RENAL BYPASS Left 06/09/2015   Procedure: LEFT RENAL Artery BYPASS;  Surgeon: Sherren Kerns, MD;  Location: [facility] OR;  Service: Vascular;  Laterality: Left;   APPENDECTOMY  2004   CHOLECYSTECTOMY N/A 09/18/2018   Procedure: LAPAROSCOPIC CHOLECYSTECTOMY;  Surgeon: Franky Macho, MD;  Location: [facility] ORS;  Service: General;  Laterality: N/A;   COLONOSCOPY  06/17/2011   INCOMPLETE, PREP POOR. Procedure: COLONOSCOPY;  Surgeon: Corbin Ade, MD;  Location: [facility] ENDO SUITE;  Service: Endoscopy;  Laterality: N/A;  10:00   COLONOSCOPY  07/15/2011   IOE:[id number] rectal and colon polyps   COLONOSCOPY N/A 05/22/2015   GHW:[id number] colonic and rectal polyps. tubular adenomas.repeat TCS 05/2018   ESOPHAGEAL DILATION N/A 05/22/2015  Procedure: ESOPHAGEAL DILATION;  Surgeon: Corbin Ade, MD;  Location: [facility] ENDO SUITE;  Service: Endoscopy;  Laterality: N/A;   ESOPHAGOGASTRODUODENOSCOPY  06/17/2011   severe erosive/ulcerative reflux esophagitis, soft noncritical stricture dilatied, small hh, antral erosion    ESOPHAGOGASTRODUODENOSCOPY N/A 05/22/2015   RMR: 2 cm HH otherwise normal. s/p empirical dilation.   INSERTION OF DIALYSIS CATHETER Left 06/26/2015   Procedure: INSERTION OF DIALYSIS CATHETER;  Surgeon: Chuck Hint, MD;  Location: [facility] OR;  Service:  Vascular;  Laterality: Left;   PERIPHERAL VASCULAR CATHETERIZATION N/A 05/28/2015   Procedure: Abdominal Aortogram;  Surgeon: Nada Libman, MD;  Location: [facility] INVASIVE CV LAB;  Service: Cardiovascular;  Laterality: N/A;   testicular cancer  1990   right orchiectomy    Family History  Problem Relation Age of Onset   Colon cancer Father 66       deceased   Prostate cancer Father    Heart disease Mother    Liver disease Neg Hx    Anesthesia problems Neg Hx    Hypotension Neg Hx    Malignant hyperthermia Neg Hx    Pseudochol deficiency Neg Hx     Social History   Socioeconomic History   Marital status: Married    Spouse name: Not on file   Number of children: 2   Years of education: Not on file   Highest education level: Not on file  Occupational History   Occupation: disabled    Employer: UNEMPLOYED  Tobacco Use   Smoking status: Former    Packs/day: 0.50    Years: 40.00    Additional pack years: 0.00    Total pack years: 20.00    Types: Cigarettes    Quit date: 01/04/2022    Years since quitting: 0.7   Smokeless tobacco: Never   Tobacco comments:    1/2 pack daily  Vaping Use   Vaping Use: Never used  Substance and Sexual Activity   Alcohol use: No    Alcohol/week: 0.0 standard drinks of alcohol   Drug use: No   Sexual activity: Yes    Birth control/protection: None  Other Topics Concern   Not on file  Social History Narrative   Not on file   Social Determinants of Health   Financial Resource Strain: Low Risk  (02/16/2021)   Overall Financial Resource Strain (CARDIA)    Difficulty of Paying Living Expenses: Not hard at all  Food Insecurity: No Food Insecurity (02/16/2021)   Hunger Vital Sign    Worried About Running Out of Food in the Last Year: Never true    Ran Out of Food in the Last Year: Never true  Transportation Needs: No Transportation Needs (02/16/2021)   PRAPARE - Administrator, Civil Service (Medical): No    Lack of  Transportation (Non-Medical): No  Physical Activity: Insufficiently Active (02/16/2021)   Exercise Vital Sign    Days of Exercise per Week: 3 days    Minutes of Exercise per Session: 30 min  Stress: No Stress Concern Present (02/16/2021)   [employer] of Occupational Health - Occupational Stress Questionnaire    Feeling of Stress : Not at all  Social Connections: Socially Integrated (02/16/2021)   Social Connection and Isolation Panel [NHANES]    Frequency of Communication with Friends and Family: More than three times a week    Frequency of Social Gatherings with Friends and Family: More than three times a week    Attends Religious Services: More than 4 times  per year    Active Member of Clubs or Organizations: Yes    Attends Banker Meetings: More than 4 times per year    Marital Status: Married  Catering manager Violence: Not At Risk (02/16/2021)   Humiliation, Afraid, Rape, and Kick questionnaire    Fear of Current or Ex-Partner: No    Emotionally Abused: No    Physically Abused: No    Sexually Abused: No    Outpatient Medications Prior to Visit  Medication Sig Dispense Refill   albuterol (PROVENTIL) (2.5 MG/3ML) 0.083% nebulizer solution USE 1 VIAL IN NEBULIZER EVERY 4 HOURS AS NEEDED FOR WHEEZING OR SHORTNESS OF BREATH 180 mL 0   albuterol (VENTOLIN HFA) 108 (90 Base) MCG/ACT inhaler INHALE 1 TO 2 PUFFS BY MOUTH EVERY 6 HOURS AS NEEDED FOR WHEEZING FOR SHORTNESS OF BREATH 9 g 0   atorvastatin (LIPITOR) 20 MG tablet Take 1 tablet by mouth once daily 90 tablet 0   calcitRIOL (ROCALTROL) 0.25 MCG capsule Take 0.25 mcg by mouth daily.      fluticasone (FLONASE) 50 MCG/ACT nasal spray Place 1 spray into both nostrils daily as needed for allergies or rhinitis.     Fluticasone-Umeclidin-Vilant (TRELEGY ELLIPTA) 100-62.5-25 MCG/ACT AEPB Inhale 1 puff into the lungs daily. 1 each 11   furosemide (LASIX) 40 MG tablet TAKE 1/2 TO 1 (ONE-HALF TO ONE) TABLET BY MOUTH ONCE  DAILY. ALTERNATE 1 WHOLE TABLET WITH 1/2 TABLET DAILY 45 tablet 0   hydrALAZINE (APRESOLINE) 100 MG tablet TAKE 1 TABLET BY MOUTH THREE TIMES DAILY 270 tablet 1   metoprolol succinate (TOPROL-XL) 100 MG 24 hr tablet TAKE 1 & 1/2 (ONE & ONE-HALF) TABLETS BY MOUTH ONCE DAILY 135 tablet 2   nitroGLYCERIN (NITROSTAT) 0.4 MG SL tablet place 1 tablet under the tongue if needed every 5 minutes for chest pain for 3 doses IF NO RELIEF AFTER 3RD DOSE CALL PRESCRIBER OR 911. 25 tablet 2   amLODipine (NORVASC) 10 MG tablet Take 1 tablet by mouth once daily 90 tablet 0   pantoprazole (PROTONIX) 40 MG tablet Take 1 tablet by mouth once daily 90 tablet 0   tamsulosin (FLOMAX) 0.4 MG CAPS capsule TAKE 1 CAPSULE BY MOUTH ONCE DAILY AFTER SUPPER 90 capsule 0   warfarin (COUMADIN) 5 MG tablet TAKE 1 TO 1 1/2  TABLETS BY MOUTH ONCE DAILY AS  DIRECTED  BY  COUMADIN  CLINIC 100 tablet 1   No facility-administered medications prior to visit.    No Known Allergies  ROS Review of Systems  Constitutional:  Negative for chills and fever.  HENT:  Negative for congestion and sore throat.   Eyes:  Negative for pain and discharge.  Respiratory:  Positive for cough (chronic). Negative for shortness of breath and wheezing.   Cardiovascular:  Negative for chest pain and palpitations.  Gastrointestinal:  Negative for constipation, diarrhea, nausea and vomiting.  Endocrine: Negative for polydipsia and polyuria.  Genitourinary:  Negative for dysuria and hematuria.  Musculoskeletal:  Negative for neck pain and neck stiffness.  Skin:  Negative for rash.  Neurological:  Negative for dizziness, weakness, numbness and headaches.  Psychiatric/Behavioral:  Negative for agitation and behavioral problems.       Objective:    Physical Exam Vitals reviewed.  Constitutional:      General: He is not in acute distress.    Appearance: He is not diaphoretic.  HENT:     Head: Normocephalic and atraumatic.     Nose: Nose normal.  Mouth/Throat:     Mouth: Mucous membranes are moist.  Eyes:     General: No scleral icterus.    Extraocular Movements: Extraocular movements intact.  Cardiovascular:     Rate and Rhythm: Normal rate and regular rhythm.     Heart sounds: Normal heart sounds. No murmur heard. Pulmonary:     Breath sounds: No wheezing or rales.  Abdominal:     Palpations: Abdomen is soft.     Tenderness: There is no abdominal tenderness.  Musculoskeletal:     Cervical back: Neck supple. No tenderness.     Right lower leg: No edema.     Left lower leg: No edema.  Skin:    General: Skin is warm.     Findings: No rash.  Neurological:     General: No focal deficit present.     Mental Status: He is alert and oriented to person, place, and time.     Cranial Nerves: No cranial nerve deficit.     Sensory: No sensory deficit.     Motor: No weakness.  Psychiatric:        Mood and Affect: Mood normal.        Behavior: Behavior normal.     BP (!) 121/57 (BP Location: Left Arm, Patient Position: Sitting, Cuff Size: Normal)   Pulse 69   Ht 6' (1.829 m)   Wt 174 lb 9.6 oz (79.2 kg)   SpO2 96%   BMI 23.68 kg/m  Wt Readings from Last 3 Encounters:  10/12/22 174 lb 9.6 oz (79.2 kg)  09/23/22 174 lb 9.6 oz (79.2 kg)  03/30/22 178 lb 12.8 oz (81.1 kg)    Lab Results  Component Value Date   TSH 1.03 07/27/2017   Lab Results  Component Value Date   WBC 6.1 05/04/2021   HGB 9.8 (L) 05/04/2021   HCT 30.4 (L) 05/04/2021   MCV 88 05/04/2021   PLT 151 05/04/2021   Lab Results  Component Value Date   NA 142 05/04/2021   K 3.7 05/04/2021   CO2 15 (L) 05/04/2021   GLUCOSE 155 (H) 05/04/2021   BUN 29 (H) 05/04/2021   CREATININE 3.40 (H) 05/04/2021   BILITOT 0.3 02/17/2021   ALKPHOS 125 (H) 02/17/2021   AST 14 02/17/2021   ALT 16 02/17/2021   PROT 6.7 02/17/2021   ALBUMIN 4.0 02/17/2021   CALCIUM 9.1 05/04/2021   ANIONGAP 10 08/24/2020   EGFR 19 (L) 05/04/2021   Lab Results  Component  Value Date   CHOL 112 02/17/2021   Lab Results  Component Value Date   HDL 25 (L) 02/17/2021   Lab Results  Component Value Date   LDLCALC 56 02/17/2021   Lab Results  Component Value Date   TRIG 183 (H) 02/17/2021   Lab Results  Component Value Date   CHOLHDL 4.5 02/17/2021   Lab Results  Component Value Date   HGBA1C 5.5 02/17/2021      Assessment & Plan:   Problem List Items Addressed This Visit       Cardiovascular and Mediastinum   Essential hypertension (Chronic)    BP Readings from Last 1 Encounters:  10/12/22 (!) 121/57  Usually well-controlled with Amlodipine, Metoprolol and Hydralazine - followed by Cardiology and Nephrology Counseled for compliance with the medications Advised DASH diet and moderate exercise/walking as tolerated      Relevant Medications   amLODipine (NORVASC) 10 MG tablet   CAD - s/p PCI to Cx. 100% RCA CTO (Chronic)  On Statin On Coumadin for A Fib On Metoprolol F/u with Cardiology      Relevant Medications   amLODipine (NORVASC) 10 MG tablet   Other Relevant Orders   Lipid panel   Chronic combined systolic and diastolic CHF (congestive heart failure) (HCC)    Echo in 08/2019 showed EF of 50-55%. Appears euvolemic currently Needs to follow low salt diet Continue daily weight checks On Lasix If more than 3 lb weight gain in 1 day or more than 5 lb in 1 week, needs to contact Cardiology      Relevant Medications   amLODipine (NORVASC) 10 MG tablet   Paroxysmal A-fib (HCC)    On Metoprolol for rate control On Coumadin Follows up with [facility] clinic      Relevant Medications   amLODipine (NORVASC) 10 MG tablet   Other Relevant Orders   TSH     Digestive   GERD (gastroesophageal reflux disease)    On Pantoprazole      Relevant Medications   pantoprazole (PROTONIX) 40 MG tablet     Genitourinary   Chronic kidney disease    F/u with Nephrology - Dr Hyman Hopes - needs a f/u visit Avoid nephrotoxic agents On  Calcitriol On iron supplements On Lasix      Relevant Orders   CMP14+EGFR   CBC with Differential/Platelet   BPH (benign prostatic hyperplasia)    On Flomax Check PSA      Relevant Medications   tamsulosin (FLOMAX) 0.4 MG CAPS capsule   Other Relevant Orders   PSA     Other   Encounter for general adult medical examination with abnormal findings - Primary    Annual exam as documented. Counseling done  re healthy lifestyle involving commitment to 150 minutes exercise per week, heart healthy diet, and attaining healthy weight.The importance of adequate sleep also discussed. Changes in health habits are decided on by the patient with goals and time frames  set for achieving them. Immunization and cancer screening needs are specifically addressed at this visit.      Encounter for screening for lung cancer    Quit smoking in 2023, has > 20-pack-year smoking history Ordered low-dose CT chest after discussing with the patient.       Relevant Orders   CT CHEST LUNG CANCER SCREENING LOW DOSE WO CONTRAST   Other Visit Diagnoses     Hyperglycemia       Relevant Orders   Hemoglobin A1c   Vitamin D deficiency       Relevant Orders   VITAMIN D 25 Hydroxy (Vit-D Deficiency, Fractures)       Meds ordered this encounter  Medications   amLODipine (NORVASC) 10 MG tablet    Sig: Take 1 tablet (10 mg total) by mouth daily.    Dispense:  90 tablet    Refill:  3   pantoprazole (PROTONIX) 40 MG tablet    Sig: Take 1 tablet (40 mg total) by mouth daily.    Dispense:  90 tablet    Refill:  3   tamsulosin (FLOMAX) 0.4 MG CAPS capsule    Sig: Take 1 capsule (0.4 mg total) by mouth daily after supper.    Dispense:  90 capsule    Refill:  3    Follow-up: Return in about 6 months (around 04/13/2023) for COPD and A Fib.    Anabel Halon, MD

## 2022-10-12 NOTE — Patient Instructions (Addendum)
Please continue to take medications as prescribed.  Please continue to follow low salt diet and perform moderate exercise/walking at least 150 mins/week.  Please consider getting Tdap vaccine at local pharmacy.

## 2022-10-14 ENCOUNTER — Ambulatory Visit: Payer: 59 | Attending: Internal Medicine | Admitting: *Deleted

## 2022-10-14 ENCOUNTER — Other Ambulatory Visit: Payer: Self-pay | Admitting: Cardiology

## 2022-10-14 DIAGNOSIS — Z5181 Encounter for therapeutic drug level monitoring: Secondary | ICD-10-CM

## 2022-10-14 DIAGNOSIS — I4891 Unspecified atrial fibrillation: Secondary | ICD-10-CM | POA: Diagnosis not present

## 2022-10-14 LAB — POCT INR: INR: 3.6 — AB (ref 2.0–3.0)

## 2022-10-14 NOTE — Patient Instructions (Signed)
Hold warfarin tonight then decrease dose to 1 tablet daily °Recheck INR in 3 weeks  °

## 2022-10-14 NOTE — Telephone Encounter (Signed)
Refill request for warfarin:  Last INR was 3.6 on 10/14/22 Next INR due 11/03/22 LOV was 09/23/22  Refill approved.

## 2022-10-15 ENCOUNTER — Encounter: Payer: Self-pay | Admitting: Internal Medicine

## 2022-10-15 DIAGNOSIS — Z122 Encounter for screening for malignant neoplasm of respiratory organs: Secondary | ICD-10-CM | POA: Insufficient documentation

## 2022-10-15 NOTE — Assessment & Plan Note (Signed)
Quit smoking in 2023, has > 20-pack-year smoking history Ordered low-dose CT chest after discussing with the patient.

## 2022-10-15 NOTE — Assessment & Plan Note (Signed)
On Pantoprazole 

## 2022-10-15 NOTE — Assessment & Plan Note (Signed)
Annual exam as documented. Counseling done  re healthy lifestyle involving commitment to 150 minutes exercise per week, heart healthy diet, and attaining healthy weight.The importance of adequate sleep also discussed. Changes in health habits are decided on by the patient with goals and time frames  set for achieving them. Immunization and cancer screening needs are specifically addressed at this visit. 

## 2022-10-15 NOTE — Assessment & Plan Note (Signed)
On Flomax Check PSA 

## 2022-10-15 NOTE — Assessment & Plan Note (Signed)
F/u with Nephrology - Dr Webb - needs a f/u visit °Avoid nephrotoxic agents °On Calcitriol °On iron supplements °On Lasix °

## 2022-10-15 NOTE — Assessment & Plan Note (Signed)
On Statin On Coumadin for A Fib On Metoprolol F/u with Cardiology 

## 2022-10-15 NOTE — Assessment & Plan Note (Addendum)
Echo in 08/2019 showed EF of 50-55%. Appears euvolemic currently Needs to follow low salt diet Continue daily weight checks On Lasix If more than 3 lb weight gain in 1 day or more than 5 lb in 1 week, needs to contact Cardiology

## 2022-10-15 NOTE — Assessment & Plan Note (Signed)
On Metoprolol for rate control ?On Coumadin ?Follows up with [facility] ?

## 2022-10-15 NOTE — Assessment & Plan Note (Signed)
BP Readings from Last 1 Encounters:  10/12/22 (!) 121/57   Usually well-controlled with Amlodipine, Metoprolol and Hydralazine - followed by Cardiology and Nephrology Counseled for compliance with the medications Advised DASH diet and moderate exercise/walking as tolerated

## 2022-10-22 ENCOUNTER — Other Ambulatory Visit: Payer: Self-pay | Admitting: Cardiology

## 2022-10-25 ENCOUNTER — Other Ambulatory Visit: Payer: Self-pay | Admitting: Cardiology

## 2022-11-02 ENCOUNTER — Other Ambulatory Visit: Payer: Self-pay | Admitting: Internal Medicine

## 2022-11-08 ENCOUNTER — Ambulatory Visit: Payer: 59 | Attending: Cardiology | Admitting: *Deleted

## 2022-11-08 DIAGNOSIS — I4891 Unspecified atrial fibrillation: Secondary | ICD-10-CM | POA: Diagnosis not present

## 2022-11-08 DIAGNOSIS — Z5181 Encounter for therapeutic drug level monitoring: Secondary | ICD-10-CM

## 2022-11-08 LAB — POCT INR: INR: 2.9 (ref 2.0–3.0)

## 2022-11-08 NOTE — Patient Instructions (Signed)
Continue warfarin 1 tablet daily.  Recheck INR in 4 weeks. 

## 2022-12-06 ENCOUNTER — Ambulatory Visit: Payer: 59 | Attending: Cardiology

## 2022-12-22 ENCOUNTER — Ambulatory Visit: Payer: 59 | Attending: Nurse Practitioner | Admitting: Nurse Practitioner

## 2022-12-22 ENCOUNTER — Ambulatory Visit: Payer: 59 | Attending: Cardiology | Admitting: *Deleted

## 2022-12-22 ENCOUNTER — Encounter: Payer: Self-pay | Admitting: Nurse Practitioner

## 2022-12-22 VITALS — BP 152/76 | HR 73 | Ht 72.0 in | Wt 175.2 lb

## 2022-12-22 DIAGNOSIS — I2510 Atherosclerotic heart disease of native coronary artery without angina pectoris: Secondary | ICD-10-CM

## 2022-12-22 DIAGNOSIS — I4891 Unspecified atrial fibrillation: Secondary | ICD-10-CM

## 2022-12-22 DIAGNOSIS — E785 Hyperlipidemia, unspecified: Secondary | ICD-10-CM

## 2022-12-22 DIAGNOSIS — I739 Peripheral vascular disease, unspecified: Secondary | ICD-10-CM | POA: Diagnosis not present

## 2022-12-22 DIAGNOSIS — Z5181 Encounter for therapeutic drug level monitoring: Secondary | ICD-10-CM | POA: Diagnosis not present

## 2022-12-22 DIAGNOSIS — I10 Essential (primary) hypertension: Secondary | ICD-10-CM

## 2022-12-22 DIAGNOSIS — Z720 Tobacco use: Secondary | ICD-10-CM | POA: Diagnosis not present

## 2022-12-22 DIAGNOSIS — I5032 Chronic diastolic (congestive) heart failure: Secondary | ICD-10-CM

## 2022-12-22 DIAGNOSIS — Z9889 Other specified postprocedural states: Secondary | ICD-10-CM | POA: Diagnosis not present

## 2022-12-22 DIAGNOSIS — J449 Chronic obstructive pulmonary disease, unspecified: Secondary | ICD-10-CM | POA: Diagnosis not present

## 2022-12-22 DIAGNOSIS — N184 Chronic kidney disease, stage 4 (severe): Secondary | ICD-10-CM | POA: Diagnosis not present

## 2022-12-22 LAB — POCT INR: INR: 2.9 (ref 2.0–3.0)

## 2022-12-22 NOTE — Patient Instructions (Addendum)
Medication Instructions:  Your physician recommends that you continue on your current medications as directed. Please refer to the Current Medication list given to you today.  Labwork: None  Testing/Procedures: None  Follow-Up: Your physician recommends that you schedule a follow-up appointment in: 3-4 Months with Philis Nettle   Any Other Special Instructions Will Be Listed Below (If Applicable).  If you need a refill on your cardiac medications before your next appointment, please call your pharmacy.

## 2022-12-22 NOTE — Patient Instructions (Signed)
Continue warfarin 1 tablet daily  Recheck INR in 4 weeks.  

## 2022-12-22 NOTE — Progress Notes (Signed)
Office Visit    Patient Name: [name] [name] Date of Encounter: 12/22/2022 PCP:  Anabel Halon, MD [facility]  Cardiologist:  Dina Rich, MD  Advanced Practice Provider:  No care team member to display Electrophysiologist:  None   Chief Complaint    [name] [name] is a 72 y.o. male with a hx of heart failure with improved ejection fraction, hypertension, CAD, hyperlipidemia, AAA, s/p repair in 2017 with left renal artery bypass at the time, PAD, A-fib, CKD stage IV, and COPD, who presents today for follow-up.  Previous cardiovascular history of remote cath at [facility].  History of CTO of RCA, received stent to OM1.  Echocardiogram in 2021 revealed EF 50 to 55%, no RWMA, moderate concentric LVH, elevated LVEDP, mild MR, BAE.   Last seen by Dr. Dina Rich on March 30, 2022.  Was doing well at the time.  I last saw this patient for 50-month follow-up on Sep 23, 2022.  Was doing well at the time, however blood pressure was elevated and not at goal. Today he presents for follow-up. Does not have his BP log but says he records his BP on his monitor at home. Says he can bring this back in to show [place] his readings. Overall he continues to do well. Denies any chest pain, shortness of breath, palpitations, syncope, presyncope, dizziness, orthopnea, PND, swelling or significant weight changes, acute bleeding, or claudication. Only smoking 1 cigarette per day.   SH: Very active and mows 8 acres of land he owns and enjoys gardening   EKGs/Labs/Other Studies Reviewed:   The following studies were reviewed today:  EKG:  EKG is not ordered today.   Echo 08/2019: 1. Left ventricular ejection fraction, by estimation, is 50 to 55%. The  left ventricle has low normal function. The left ventricle has no regional  wall motion abnormalities. There is moderate concentric left ventricular  hypertrophy. Left ventricular  diastolic parameters  are indeterminate. Elevated left ventricular  end-diastolic pressure.   2. Right ventricular systolic function is normal. The right ventricular  size is normal.   3. Left atrial size was severely dilated.   4. Right atrial size was mild to moderately dilated.   5. The mitral valve is grossly normal. Mild mitral valve regurgitation.   6. The aortic valve is tricuspid. Aortic valve regurgitation is trivial.  No aortic stenosis is present.   7. The inferior vena cava is dilated in size with >50% respiratory  variability, suggesting right atrial pressure of 8 mmHg.  Risk Assessment/Calculations:   CHA2DS2-VASc Score = 4  This indicates a 4.8% annual risk of stroke. The patient's score is based upon: CHF History: 1 HTN History: 1 Diabetes History: 0 Stroke History: 0 Vascular Disease History: 1 Age Score: 1 Gender Score: 0  Review of Systems    All other systems reviewed and are otherwise negative except as noted above.  Physical Exam    VS:  BP (!) 152/76 (BP Location: Right Arm, Patient Position: Sitting, Cuff Size: Normal)   Pulse 73   Ht 6' (1.829 m)   Wt 175 lb 3.2 oz (79.5 kg)   SpO2 97%   BMI 23.76 kg/m  , BMI Body mass index is 23.76 kg/m.  Wt Readings from Last 3 Encounters:  12/22/22 175 lb 3.2 oz (79.5 kg)  10/12/22 174 lb 9.6 oz (79.2 kg)  09/23/22 174 lb 9.6 oz (79.2 kg)    GEN: Well nourished,  well developed, in no acute distress. HEENT: normal. Neck: Supple, no JVD, carotid bruits, or masses. Cardiac: S1/S2, irregular rhythm and regular rate, no murmurs, rubs, or gallops. No clubbing, cyanosis, edema.  Radials/PT 2+ and equal bilaterally.  Respiratory:  Respirations regular and unlabored, clear to auscultation bilaterally. MS: No deformity or atrophy. Skin: Warm and dry, no rash. Neuro:  Strength and sensation are intact. Psych: Normal affect.  Assessment & Plan    HFimpEF Stage C, NYHA class I-II symptoms. Echo 08/2019 revealed EF improved to  50-55%. Euvolemic and well compensated on exam.  Continue Lasix, hydralazine, and metoprolol succinate. Low sodium diet, fluid restriction <2L, and daily weights encouraged. Educated to contact our office for weight gain of 2 lbs overnight or 5 lbs in one week.  HTN Blood pressure elevated.  Discussed SBP goal is less than 140. Will bring patient in for RN visit in 2 weeks for BP check and for calibration check for his device. Continue amlodipine, hydralazine, and metoprolol succinate.  Heart healthy diet and regular cardiovascular exercise encouraged.   A-fib Denies any tachycardia or palpitations.  Heart rate well-controlled today.  Continue metoprolol succinate.  Tolerating Coumadin well, denies any bleeding issues.  Continue to follow-up at Coumadin clinic.   CAD Stable with no anginal symptoms.  No indication for ischemic evaluation.  Not on aspirin due to being on Coumadin.  Continue atorvastatin, metoprolol succinate, and nitroglycerin as needed. Heart healthy diet and regular cardiovascular exercise encouraged.   PAD, HLD Arterial duplex on March 2023 showed patent upper extremity arteries and patent cephalic and basilic veins.  Denies any symptoms.  Continue atorvastatin, labs being managed by PCP. Heart healthy diet and regular cardiovascular exercise encouraged.  Continue follow-up with Dr. Arbie Cookey.   AAA, s/p repair in 2017 with left renal artery bypass at the time Denies any issues.  Continue to follow-up with Dr. Arbie Cookey and PCP.  CKD stage IV  Appears he has upcoming labs with PCP.  Continue to follow-up with Dr. Wolfgang Phoenix and PCP.  Recommended to follow-up with nephrology.  GDMT limited due to severe chronic kidney disease.  Avoid nephrotoxic agents.  No medication changes at this time.   COPD, tobacco abuse Denies any recent acute exacerbations or worsening symptoms.  No medication changes.  Continue follow-up with PCP. Reports only smoking 1 cigarette per day. Smoking cessation  encouraged and discussed.  Disposition: Follow up in 3-4 months with Dina Rich, MD or APP.  Signed, Sharlene Dory, NP 12/22/2022, 9:29 AM [facility]

## 2023-01-05 ENCOUNTER — Ambulatory Visit: Payer: 59 | Attending: Cardiology

## 2023-01-05 NOTE — Progress Notes (Signed)
Patient presents today for nurse visit per E. Peck to get BP readings from monitor and calibrate   Pt machine has been calibrated and instructed to monitor BP daily until next OV with Peck BP log Given   Patient states no pain or SOB   BP today on our machine is 138/67   HR 58 Pt Machine 136/61  HR 63   Patient provided me with a copy of his BP reading given to E. Philis Nettle to review and sign to be scanned into the computer

## 2023-01-10 ENCOUNTER — Other Ambulatory Visit: Payer: Self-pay | Admitting: Internal Medicine

## 2023-01-10 DIAGNOSIS — J209 Acute bronchitis, unspecified: Secondary | ICD-10-CM

## 2023-01-19 ENCOUNTER — Telehealth: Payer: Self-pay | Admitting: *Deleted

## 2023-01-19 NOTE — Telephone Encounter (Signed)
Pt missed appt today at the coumadin clinic. Attempted to call pt to reschedule. LMOM for pt to call back.

## 2023-01-24 ENCOUNTER — Ambulatory Visit: Payer: 59 | Attending: Cardiology | Admitting: *Deleted

## 2023-01-24 DIAGNOSIS — Z5181 Encounter for therapeutic drug level monitoring: Secondary | ICD-10-CM

## 2023-01-24 DIAGNOSIS — I4891 Unspecified atrial fibrillation: Secondary | ICD-10-CM | POA: Diagnosis not present

## 2023-01-24 LAB — POCT INR: INR: 3.7 — AB (ref 2.0–3.0)

## 2023-01-24 NOTE — Patient Instructions (Signed)
Hold warfarin tonight then resume 1 tablet daily   Recheck INR in 3 weeks.

## 2023-02-01 ENCOUNTER — Other Ambulatory Visit: Payer: Self-pay

## 2023-02-01 ENCOUNTER — Other Ambulatory Visit: Payer: Self-pay | Admitting: Internal Medicine

## 2023-02-01 DIAGNOSIS — J209 Acute bronchitis, unspecified: Secondary | ICD-10-CM

## 2023-02-01 MED ORDER — TRELEGY ELLIPTA 100-62.5-25 MCG/ACT IN AEPB
1.0000 | INHALATION_SPRAY | Freq: Every day | RESPIRATORY_TRACT | 11 refills | Status: DC
Start: 2023-02-01 — End: 2023-06-16

## 2023-02-13 ENCOUNTER — Other Ambulatory Visit: Payer: Self-pay | Admitting: Internal Medicine

## 2023-02-14 ENCOUNTER — Ambulatory Visit: Payer: 59 | Attending: Cardiology | Admitting: *Deleted

## 2023-02-14 DIAGNOSIS — I4891 Unspecified atrial fibrillation: Secondary | ICD-10-CM

## 2023-02-14 DIAGNOSIS — Z5181 Encounter for therapeutic drug level monitoring: Secondary | ICD-10-CM

## 2023-02-14 LAB — POCT INR: INR: 1.9 — AB (ref 2.0–3.0)

## 2023-02-14 NOTE — Patient Instructions (Signed)
Take warfarin 1 1/2 tablets tonight then resume 1 tablet daily   Recheck INR in 4 weeks.

## 2023-03-01 ENCOUNTER — Other Ambulatory Visit: Payer: Self-pay | Admitting: Cardiology

## 2023-03-01 NOTE — Telephone Encounter (Signed)
Refill request for warfarin:  Last INR was 1.9 on 02/14/23 Next INR due 03/14/23 LOV was 12/22/22  Refill approved.

## 2023-03-02 ENCOUNTER — Other Ambulatory Visit: Payer: Self-pay | Admitting: Internal Medicine

## 2023-03-02 DIAGNOSIS — J209 Acute bronchitis, unspecified: Secondary | ICD-10-CM

## 2023-03-14 ENCOUNTER — Ambulatory Visit: Payer: 59 | Attending: Cardiology | Admitting: *Deleted

## 2023-03-14 DIAGNOSIS — Z5181 Encounter for therapeutic drug level monitoring: Secondary | ICD-10-CM

## 2023-03-14 DIAGNOSIS — I4891 Unspecified atrial fibrillation: Secondary | ICD-10-CM

## 2023-03-14 LAB — POCT INR: INR: 4.3 — AB (ref 2.0–3.0)

## 2023-03-14 NOTE — Patient Instructions (Signed)
Hold warfarin tonight then resume 1 tablet daily   Recheck INR in 2 weeks.

## 2023-03-24 ENCOUNTER — Telehealth: Payer: Self-pay | Admitting: Nurse Practitioner

## 2023-03-24 ENCOUNTER — Ambulatory Visit: Payer: 59 | Attending: Nurse Practitioner | Admitting: Nurse Practitioner

## 2023-03-24 ENCOUNTER — Encounter: Payer: Self-pay | Admitting: Nurse Practitioner

## 2023-03-24 VITALS — BP 128/58 | HR 60 | Ht 72.0 in | Wt 174.2 lb

## 2023-03-24 DIAGNOSIS — Z720 Tobacco use: Secondary | ICD-10-CM

## 2023-03-24 DIAGNOSIS — I739 Peripheral vascular disease, unspecified: Secondary | ICD-10-CM

## 2023-03-24 DIAGNOSIS — E785 Hyperlipidemia, unspecified: Secondary | ICD-10-CM | POA: Diagnosis not present

## 2023-03-24 DIAGNOSIS — Z9889 Other specified postprocedural states: Secondary | ICD-10-CM | POA: Diagnosis not present

## 2023-03-24 DIAGNOSIS — N184 Chronic kidney disease, stage 4 (severe): Secondary | ICD-10-CM

## 2023-03-24 DIAGNOSIS — I10 Essential (primary) hypertension: Secondary | ICD-10-CM | POA: Diagnosis not present

## 2023-03-24 DIAGNOSIS — I4891 Unspecified atrial fibrillation: Secondary | ICD-10-CM

## 2023-03-24 DIAGNOSIS — I5032 Chronic diastolic (congestive) heart failure: Secondary | ICD-10-CM

## 2023-03-24 DIAGNOSIS — J449 Chronic obstructive pulmonary disease, unspecified: Secondary | ICD-10-CM | POA: Diagnosis not present

## 2023-03-24 DIAGNOSIS — I2510 Atherosclerotic heart disease of native coronary artery without angina pectoris: Secondary | ICD-10-CM

## 2023-03-24 NOTE — Patient Instructions (Addendum)
Medication Instructions:  Your physician recommends that you continue on your current medications as directed. Please refer to the Current Medication list given to you today.  Labwork: In 1-2 weeks   Testing/Procedures: None  Follow-Up: Your physician recommends that you schedule a follow-up appointment in: 6 Months   Any Other Special Instructions Will Be Listed Below (If Applicable).  If you need a refill on your cardiac medications before your next appointment, please call your pharmacy.

## 2023-03-24 NOTE — Telephone Encounter (Signed)
New Message:     Patient wife called. She would like for [facility] to please refer him to a kidney. His previous doctor have retired.

## 2023-03-24 NOTE — Progress Notes (Signed)
Office Visit    Patient Name: [name] [name] Date of Encounter: 03/24/2023 PCP:  Anabel Halon, MD [facility]  Cardiologist:  Dina Rich, MD  Advanced Practice Provider:  No care team member to display Electrophysiologist:  None   Chief Complaint    [name] [name] is a 72 y.o. male with a hx of heart failure with improved ejection fraction, hypertension, CAD, hyperlipidemia, AAA, s/p repair in 2017 with left renal artery bypass at the time, PAD, A-fib, CKD stage IV, and COPD, who presents today for follow-up.  Previous cardiovascular history of remote cath at [facility].  History of CTO of RCA, received stent to OM1.  Echocardiogram in 2021 revealed EF 50 to 55%, no RWMA, moderate concentric LVH, elevated LVEDP, mild MR, BAE.   Last seen by Dr. Dina Rich on March 30, 2022.  Was doing well at the time.  12/22/2022 - I last saw this patient for 69-month follow-up on Sep 23, 2022.  Was doing well at the time, however blood pressure was elevated and not at goal. Today he presents for follow-up. Does not have his BP log but says he records his BP on his monitor at home. Says he can bring this back in to show [place] his readings. Overall he continues to do well. Denies any chest pain, shortness of breath, palpitations, syncope, presyncope, dizziness, orthopnea, PND, swelling or significant weight changes, acute bleeding, or claudication. Only smoking 1 cigarette per day.   03/24/2023 -presents today for follow-up.  He is doing well and denies any acute cardiac complaints or issues.  Shows me his blood pressure log recent BP readings are overall at goal with SBP averaging 130s to 140s. Denies any chest pain, shortness of breath, palpitations, syncope, presyncope, dizziness, orthopnea, PND, swelling or significant weight changes, acute bleeding, or claudication.  SH: Very active and mows 8 acres of land he owns and enjoys gardening    EKGs/Labs/Other Studies Reviewed:   The following studies were reviewed today:  EKG:  EKG is not ordered today.   Echo 08/2019: 1. Left ventricular ejection fraction, by estimation, is 50 to 55%. The  left ventricle has low normal function. The left ventricle has no regional  wall motion abnormalities. There is moderate concentric left ventricular  hypertrophy. Left ventricular  diastolic parameters are indeterminate. Elevated left ventricular  end-diastolic pressure.   2. Right ventricular systolic function is normal. The right ventricular  size is normal.   3. Left atrial size was severely dilated.   4. Right atrial size was mild to moderately dilated.   5. The mitral valve is grossly normal. Mild mitral valve regurgitation.   6. The aortic valve is tricuspid. Aortic valve regurgitation is trivial.  No aortic stenosis is present.   7. The inferior vena cava is dilated in size with >50% respiratory  variability, suggesting right atrial pressure of 8 mmHg.  Risk Assessment/Calculations:   CHA2DS2-VASc Score = 4  This indicates a 4.8% annual risk of stroke. The patient's score is based upon: CHF History: 1 HTN History: 1 Diabetes History: 0 Stroke History: 0 Vascular Disease History: 1 Age Score: 1 Gender Score: 0  Review of Systems    All other systems reviewed and are otherwise negative except as noted above.  Physical Exam    VS:  BP (!) 128/58 (BP Location: Right Arm)   Pulse 60   Ht 6' (1.829 m)   Wt 174 lb 3.2 oz (  79 kg)   SpO2 98%   BMI 23.63 kg/m  , BMI Body mass index is 23.63 kg/m.  Wt Readings from Last 3 Encounters:  03/24/23 174 lb 3.2 oz (79 kg)  01/05/23 171 lb (77.6 kg)  12/22/22 175 lb 3.2 oz (79.5 kg)    GEN: Well nourished, well developed, in no acute distress. HEENT: normal. Neck: Supple, no JVD, carotid bruits, or masses. Cardiac: S1/S2, irregular rhythm and regular rate, no murmurs, rubs, or gallops. No clubbing, cyanosis, edema.   Radials/PT 2+ and equal bilaterally.  Respiratory:  Respirations regular and unlabored, clear to auscultation bilaterally. MS: No deformity or atrophy. Skin: Warm and dry, no rash. Neuro:  Strength and sensation are intact. Psych: Normal affect.  Assessment & Plan    HFimpEF Stage C, NYHA class I-II symptoms. Echo 08/2019 revealed EF improved to 50-55%. Euvolemic and well compensated on exam.  Continue Lasix, hydralazine, and metoprolol succinate. Low sodium diet, fluid restriction <2L, and daily weights encouraged. Educated to contact our office for weight gain of 2 lbs overnight or 5 lbs in one week. Will obtain CBC and CMET.   HTN Blood pressure stable and at goal.  Discussed SBP goal is less than 140. Discussed to monitor BP at home at least 2 hours after medications and sitting for 5-10 minutes. Continue amlodipine, hydralazine, and metoprolol succinate.  Heart healthy diet and regular cardiovascular exercise encouraged.   A-fib Denies any tachycardia or palpitations.  Heart rate well-controlled today.  Continue metoprolol succinate.  Tolerating Coumadin well, denies any bleeding issues.  Continue to follow-up at Coumadin clinic. Will obtain CBC and CMET.    CAD Stable with no anginal symptoms.  No indication for ischemic evaluation.  Not on aspirin due to being on Coumadin.  Continue atorvastatin, metoprolol succinate, and nitroglycerin as needed. Heart healthy diet and regular cardiovascular exercise encouraged.  PAD, HLD Arterial duplex on March 2023 showed patent upper extremity arteries and patent cephalic and basilic veins.  Denies any symptoms.  Continue atorvastatin, labs being managed by PCP. Heart healthy diet and regular cardiovascular exercise encouraged.  Continue follow-up with [facility]. Will obtain FLP.   AAA, s/p repair in 2017 with left renal artery bypass at the time Denies any issues.  Continue to follow-up with [facility] and PCP.  CKD stage IV  He is due for labs. Continue to  follow-up with Dr. Wolfgang Phoenix and PCP.  Recommended to follow-up with nephrology.  GDMT limited due to severe chronic kidney disease.  Avoid nephrotoxic agents.  No medication changes at this time. Will obtain CMET.   COPD, tobacco use Denies any recent acute exacerbations or worsening symptoms.  No medication changes.  Continue follow-up with PCP. Reports only smoking 1 cigarette per day. Smoking cessation encouraged and discussed.  Disposition: Follow up in 6 months with Dina Rich, MD or APP.  Signed, Sharlene Dory, NP 03/24/2023, 1:11 PM [facility]

## 2023-03-25 ENCOUNTER — Telehealth: Payer: Self-pay

## 2023-03-25 ENCOUNTER — Other Ambulatory Visit: Payer: Self-pay

## 2023-03-25 ENCOUNTER — Other Ambulatory Visit (HOSPITAL_COMMUNITY)
Admission: RE | Admit: 2023-03-25 | Discharge: 2023-03-25 | Disposition: A | Discharge status: Home / Self Care | Payer: 59 | Source: Ambulatory Visit | Attending: Nurse Practitioner | Admitting: Nurse Practitioner

## 2023-03-25 DIAGNOSIS — I5032 Chronic diastolic (congestive) heart failure: Secondary | ICD-10-CM | POA: Diagnosis not present

## 2023-03-25 LAB — COMPREHENSIVE METABOLIC PANEL
ALT: 19 U/L (ref 0–44)
AST: 17 U/L (ref 15–41)
Albumin: 3.2 g/dL — ABNORMAL LOW (ref 3.5–5.0)
Alkaline Phosphatase: 109 U/L (ref 38–126)
Anion gap: 11 (ref 5–15)
BUN: 47 mg/dL — ABNORMAL HIGH (ref 8–23)
CO2: 18 mmol/L — ABNORMAL LOW (ref 22–32)
Calcium: 9.2 mg/dL (ref 8.9–10.3)
Chloride: 109 mmol/L (ref 98–111)
Creatinine, Ser: 4.35 mg/dL — ABNORMAL HIGH (ref 0.61–1.24)
GFR, Estimated: 14 mL/min — ABNORMAL LOW (ref >60)
Glucose, Bld: 103 mg/dL — ABNORMAL HIGH (ref 70–99)
Potassium: 4.1 mmol/L (ref 3.5–5.1)
Sodium: 138 mmol/L (ref 135–145)
Total Bilirubin: 0.8 mg/dL (ref <1.2)
Total Protein: 6.7 g/dL (ref 6.5–8.1)

## 2023-03-25 LAB — CBC
HCT: 31 % — ABNORMAL LOW (ref 39.0–52.0)
Hemoglobin: 9.9 g/dL — ABNORMAL LOW (ref 13.0–17.0)
MCH: 30.7 pg (ref 26.0–34.0)
MCHC: 31.9 g/dL (ref 30.0–36.0)
MCV: 96 fL (ref 80.0–100.0)
Platelets: 146 10*3/uL — ABNORMAL LOW (ref 150–400)
RBC: 3.23 MIL/uL — ABNORMAL LOW (ref 4.22–5.81)
RDW: 14.4 % (ref 11.5–15.5)
WBC: 5.4 10*3/uL (ref 4.0–10.5)
nRBC: 0 % (ref 0.0–0.2)

## 2023-03-25 LAB — LIPID PANEL
Cholesterol: 110 mg/dL (ref 0–200)
HDL: 26 mg/dL — ABNORMAL LOW (ref >40)
LDL Cholesterol: 57 mg/dL (ref 0–99)
Total CHOL/HDL Ratio: 4.2 {ratio}
Triglycerides: 136 mg/dL (ref <150)
VLDL: 27 mg/dL (ref 0–40)

## 2023-03-25 MED ORDER — CALCITRIOL 0.25 MCG PO CAPS: 0.2500 ug | ORAL_CAPSULE | Freq: Every day | ORAL | 0 refills | Status: DC

## 2023-03-25 NOTE — Telephone Encounter (Signed)
Refills sent, left message for patient.

## 2023-03-25 NOTE — Telephone Encounter (Signed)
Copied from CRM [id number]. Topic: Clinical - Medication Refill >> Mar 25, 2023  8:57 AM Dennison Nancy wrote: Most Recent Primary Care Visit:  Provider: Anabel Halon  Department: [facility]  Visit Type: OFFICE VISIT  Date: 10/12/2022  Medication: calcitRIOL (ROCALTROL) 0.25 MCG capsule  Has the patient contacted their pharmacy? yes (Agent: If no, request that the patient contact the pharmacy for the refill. If patient does not wish to contact the pharmacy document the reason why and proceed with request.) (Agent: If yes, when and what did the pharmacy advise?)  Is this the correct pharmacy for this prescription? yes If no, delete pharmacy and type the correct one.  This is the patient's preferred pharmacy:  [facility] [address], [place] - [address] [place] [address]  [place] [address] Phone: [phone] Fax: [phone]  [facility] [place], [place] - [address] [place] [address] Phone: [phone] Fax: [phone]   Has the prescription been filled recently? No last refill 02/03/23   Is the patient out of the medication? Yes   Has the patient been seen for an appointment in the last year OR does the patient have an upcoming appointment? Yes   Can we respond through MyChart? Yes   Agent: Please be advised that Rx refills may take up to 3 business days. We ask that you follow-up with your pharmacy.

## 2023-03-25 NOTE — Telephone Encounter (Signed)
Referral sent to Dr.Bhutani Patient doing labs today

## 2023-03-28 ENCOUNTER — Other Ambulatory Visit: Payer: Self-pay | Admitting: Internal Medicine

## 2023-03-28 DIAGNOSIS — J209 Acute bronchitis, unspecified: Secondary | ICD-10-CM

## 2023-03-29 ENCOUNTER — Ambulatory Visit: Payer: 59 | Attending: Cardiology | Admitting: *Deleted

## 2023-03-29 DIAGNOSIS — Z5181 Encounter for therapeutic drug level monitoring: Secondary | ICD-10-CM

## 2023-03-29 DIAGNOSIS — I4891 Unspecified atrial fibrillation: Secondary | ICD-10-CM

## 2023-03-29 LAB — POCT INR: INR: 4 — AB (ref 2.0–3.0)

## 2023-03-29 NOTE — Patient Instructions (Signed)
Hold warfarin tonight then decrease dose to 1 tablet daily  except 1/2 tablet on Sundays and Wednesdays Recheck INR in 2 weeks.

## 2023-04-05 DIAGNOSIS — N184 Chronic kidney disease, stage 4 (severe): Secondary | ICD-10-CM | POA: Diagnosis not present

## 2023-04-05 DIAGNOSIS — E559 Vitamin D deficiency, unspecified: Secondary | ICD-10-CM | POA: Diagnosis not present

## 2023-04-05 DIAGNOSIS — R739 Hyperglycemia, unspecified: Secondary | ICD-10-CM | POA: Diagnosis not present

## 2023-04-05 DIAGNOSIS — I480 Paroxysmal atrial fibrillation: Secondary | ICD-10-CM | POA: Diagnosis not present

## 2023-04-05 DIAGNOSIS — I2510 Atherosclerotic heart disease of native coronary artery without angina pectoris: Secondary | ICD-10-CM | POA: Diagnosis not present

## 2023-04-06 LAB — CMP14+EGFR
ALT: 11 [IU]/L (ref 0–44)
AST: 13 [IU]/L (ref 0–40)
Albumin: 3.6 g/dL — ABNORMAL LOW (ref 3.9–4.9)
Alkaline Phosphatase: 102 [IU]/L (ref 44–121)
BUN/Creatinine Ratio: 9 — ABNORMAL LOW (ref 10–24)
BUN: 42 mg/dL — ABNORMAL HIGH (ref 8–27)
Bilirubin Total: 0.5 mg/dL (ref 0.0–1.2)
CO2: 18 mmol/L — ABNORMAL LOW (ref 20–29)
Calcium: 9.3 mg/dL (ref 8.6–10.2)
Chloride: 108 mmol/L — ABNORMAL HIGH (ref 96–106)
Creatinine, Ser: 4.45 mg/dL — ABNORMAL HIGH (ref 0.76–1.27)
Globulin, Total: 2.3 g/dL (ref 1.5–4.5)
Glucose: 104 mg/dL — ABNORMAL HIGH (ref 70–99)
Potassium: 3.6 mmol/L (ref 3.5–5.2)
Sodium: 143 mmol/L (ref 134–144)
Total Protein: 5.9 g/dL — ABNORMAL LOW (ref 6.0–8.5)
eGFR: 13 mL/min/{1.73_m2} — ABNORMAL LOW (ref >59)

## 2023-04-06 LAB — CBC WITH DIFFERENTIAL/PLATELET
Basophils Absolute: 0 10*3/uL (ref 0.0–0.2)
Basos: 1 %
EOS (ABSOLUTE): 0.1 10*3/uL (ref 0.0–0.4)
Eos: 3 %
Hematocrit: 28.7 % — ABNORMAL LOW (ref 37.5–51.0)
Hemoglobin: 9.1 g/dL — ABNORMAL LOW (ref 13.0–17.7)
Immature Grans (Abs): 0 10*3/uL (ref 0.0–0.1)
Immature Granulocytes: 0 %
Lymphocytes Absolute: 1.2 10*3/uL (ref 0.7–3.1)
Lymphs: 22 %
MCH: 29.8 pg (ref 26.6–33.0)
MCHC: 31.7 g/dL (ref 31.5–35.7)
MCV: 94 fL (ref 79–97)
Monocytes Absolute: 0.6 10*3/uL (ref 0.1–0.9)
Monocytes: 10 %
Neutrophils Absolute: 3.6 10*3/uL (ref 1.4–7.0)
Neutrophils: 64 %
Platelets: 144 10*3/uL — ABNORMAL LOW (ref 150–450)
RBC: 3.05 x10E6/uL — ABNORMAL LOW (ref 4.14–5.80)
RDW: 14 % (ref 11.6–15.4)
WBC: 5.6 10*3/uL (ref 3.4–10.8)

## 2023-04-06 LAB — HEMOGLOBIN A1C
Est. average glucose Bld gHb Est-mCnc: 91 mg/dL
Hgb A1c MFr Bld: 4.8 % (ref 4.8–5.6)

## 2023-04-06 LAB — PSA: Prostate Specific Ag, Serum: 0.7 ng/mL (ref 0.0–4.0)

## 2023-04-06 LAB — VITAMIN D 25 HYDROXY (VIT D DEFICIENCY, FRACTURES): Vit D, 25-Hydroxy: 31.2 ng/mL (ref 30.0–100.0)

## 2023-04-06 LAB — LIPID PANEL
Chol/HDL Ratio: 3.9 {ratio} (ref 0.0–5.0)
Cholesterol, Total: 106 mg/dL (ref 100–199)
HDL: 27 mg/dL — ABNORMAL LOW (ref >39)
LDL Chol Calc (NIH): 53 mg/dL (ref 0–99)
Triglycerides: 149 mg/dL (ref 0–149)
VLDL Cholesterol Cal: 26 mg/dL (ref 5–40)

## 2023-04-06 LAB — TSH: TSH: 0.79 u[IU]/mL (ref 0.450–4.500)

## 2023-04-07 ENCOUNTER — Telehealth: Payer: Self-pay | Admitting: Nurse Practitioner

## 2023-04-07 NOTE — Telephone Encounter (Signed)
Dr. Wolfgang Phoenix office called in stating they received referral however there were no demographics or insurance listed for pt. They were identify because MRN was on there but they asked if that information can be faxed over.

## 2023-04-09 ENCOUNTER — Other Ambulatory Visit: Payer: Self-pay | Admitting: Cardiology

## 2023-04-12 ENCOUNTER — Ambulatory Visit: Payer: 59 | Attending: Cardiology | Admitting: *Deleted

## 2023-04-12 DIAGNOSIS — Z5181 Encounter for therapeutic drug level monitoring: Secondary | ICD-10-CM

## 2023-04-12 DIAGNOSIS — I4891 Unspecified atrial fibrillation: Secondary | ICD-10-CM

## 2023-04-12 LAB — POCT INR: INR: 3 (ref 2.0–3.0)

## 2023-04-12 NOTE — Patient Instructions (Signed)
Continue warfarin 1 tablet daily  except 1/2 tablet on Sundays and Wednesdays Recheck INR in 4 weeks.

## 2023-04-13 ENCOUNTER — Ambulatory Visit: Payer: 59 | Admitting: Internal Medicine

## 2023-04-15 DIAGNOSIS — D631 Anemia in chronic kidney disease: Secondary | ICD-10-CM | POA: Diagnosis not present

## 2023-04-15 DIAGNOSIS — E8722 Chronic metabolic acidosis: Secondary | ICD-10-CM | POA: Diagnosis not present

## 2023-04-15 DIAGNOSIS — I480 Paroxysmal atrial fibrillation: Secondary | ICD-10-CM | POA: Diagnosis not present

## 2023-04-15 DIAGNOSIS — N185 Chronic kidney disease, stage 5: Secondary | ICD-10-CM | POA: Diagnosis not present

## 2023-04-18 ENCOUNTER — Other Ambulatory Visit: Payer: Self-pay | Admitting: Internal Medicine

## 2023-04-18 DIAGNOSIS — J209 Acute bronchitis, unspecified: Secondary | ICD-10-CM

## 2023-04-20 ENCOUNTER — Ambulatory Visit (INDEPENDENT_AMBULATORY_CARE_PROVIDER_SITE_OTHER): Payer: 59

## 2023-04-20 VITALS — Ht 72.0 in | Wt 175.0 lb

## 2023-04-20 DIAGNOSIS — Z0000 Encounter for general adult medical examination without abnormal findings: Secondary | ICD-10-CM | POA: Diagnosis not present

## 2023-04-20 DIAGNOSIS — Z122 Encounter for screening for malignant neoplasm of respiratory organs: Secondary | ICD-10-CM

## 2023-04-20 NOTE — Progress Notes (Signed)
Subjective:   [name] [name] is a 72 y.o. male who presents for Medicare Annual/Subsequent preventive examination.  Visit Complete: Virtual I connected with  [name] [name] on 04/20/23 by a audio enabled telemedicine application and verified that I am speaking with the correct person using two identifiers.  Patient Location: Home  Provider Location: Office/Clinic  I discussed the limitations of evaluation and management by telemedicine. The patient expressed understanding and agreed to proceed.  Vital Signs: Because this visit was a virtual/telehealth visit, some criteria may be missing or patient reported. Any vitals not documented were not able to be obtained and vitals that have been documented are patient reported.  Cardiac Risk Factors include: advanced age (>72men, >55 women);dyslipidemia;family history of premature cardiovascular disease;hypertension;male gender;Other (see comment), Risk factor comments: hx of smoking     Objective:    Today's Vitals   04/20/23 0903  Weight: 175 lb (79.4 kg)  Height: 6' (1.829 m)  PainSc: 0-No pain   Body mass index is 23.73 kg/m.     04/20/2023    9:05 AM 02/22/2022    3:46 PM 02/16/2021    3:36 PM 08/24/2020    9:06 PM 10/16/2019    4:06 PM 08/20/2019    4:00 AM 08/19/2019    9:07 PM  Advanced Directives  Does Patient Have a Medical Advance Directive? No Yes No No No No No  Does patient want to make changes to medical advance directive?  Yes (MAU/Ambulatory/Procedural Areas - Information given)       Would patient like information on creating a medical advance directive? No - Patient declined  No - Patient declined  No - Patient declined No - Patient declined     Current Medications (verified) Outpatient Encounter Medications as of 04/20/2023  Medication Sig   albuterol (PROVENTIL) (2.5 MG/3ML) 0.083% nebulizer solution USE 1 VIAL IN NEBULIZER EVERY 4 HOURS AS NEEDED FOR WHEEZING OR SHORTNESS OF BREATH   albuterol (VENTOLIN  HFA) 108 (90 Base) MCG/ACT inhaler INHALE 1 TO 2 PUFFS BY MOUTH EVERY 6 HOURS AS NEEDED FOR WHEEZING OR SHORTNESS OF BREATH   amLODipine (NORVASC) 10 MG tablet Take 1 tablet (10 mg total) by mouth daily.   atorvastatin (LIPITOR) 20 MG tablet Take 1 tablet by mouth once daily   calcitRIOL (ROCALTROL) 0.25 MCG capsule Take 1 capsule (0.25 mcg total) by mouth daily.   fluticasone (FLONASE) 50 MCG/ACT nasal spray Place 1 spray into both nostrils daily as needed for allergies or rhinitis.   Fluticasone-Umeclidin-Vilant (TRELEGY ELLIPTA) 100-62.5-25 MCG/ACT AEPB Inhale 1 puff into the lungs daily.   furosemide (LASIX) 40 MG tablet TAKE 1/2 TO 1 (ONE-HALF TO ONE) TABLET BY MOUTH ONCE DAILY ALTERNATING  BETWEEN  1/2  ONE  DAY  AND  1  TABLET  THE  NEXT   hydrALAZINE (APRESOLINE) 100 MG tablet TAKE 1 TABLET BY MOUTH THREE TIMES DAILY   metoprolol succinate (TOPROL-XL) 100 MG 24 hr tablet TAKE 1.5 TABLETS BY MOUTH ONCE DAILY   nitroGLYCERIN (NITROSTAT) 0.4 MG SL tablet place 1 tablet under the tongue if needed every 5 minutes for chest pain for 3 doses IF NO RELIEF AFTER 3RD DOSE CALL PRESCRIBER OR 911.   pantoprazole (PROTONIX) 40 MG tablet Take 1 tablet (40 mg total) by mouth daily.   sodium bicarbonate 650 MG tablet Take 650 mg by mouth 3 (three) times daily.   tamsulosin (FLOMAX) 0.4 MG CAPS capsule Take 1 capsule (0.4 mg total) by mouth daily after supper.  warfarin (COUMADIN) 5 MG tablet TAKE 1 TO 1 & 1/2 (ONE & ONE-HALF) TABLETS BY MOUTH ONCE DAILY AS DIRECTED BY THE COUMADIN CLINIC   No facility-administered encounter medications on file as of 04/20/2023.    Allergies (verified) Patient has no known allergies.   History: Past Medical History:  Diagnosis Date   AAA (abdominal aortic aneurysm) (HCC)    a. s/p repair 06/2015 with left renal artery bypass at that time, post-op course c/b renal failure requiring dialysis, C dif.   Anemia    Arteriosclerotic cardiovascular disease (ASCVD)    a.  AMI in 2000 treated at [facility]. b. cath in 12/2006->  Chronic total obstruction of the RCA;  drug-eluting stent placed in OM1, LVEF abnormal.   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   PVD (peripheral vascular disease) (HCC)    Ct angiogram in 2009 revealed stable disease with 80% celiac stenosis,50% right renal artery ,ASCVD with ulceration in the abdominal aortashe   Testicular carcinoma (HCC) 1990   right orchiectomy   Tobacco abuse, in remission    20 pack years; quit in 2009   Past Surgical History:  Procedure Laterality Date   ABDOMINAL AORTIC ANEURYSM REPAIR N/A 06/09/2015   Procedure: ANEURYSM ABDOMINAL AORTIC REPAIR;  Surgeon: Sherren Kerns, MD;  Location: [facility] OR;  Service: Vascular;  Laterality: N/A;   AORTIC ENDARTERECETOMY N/A 06/09/2015   Procedure: AORTIC ENDARTERECETOMY;  Surgeon: Sherren Kerns, MD;  Location: [facility] OR;  Service: Vascular;  Laterality: N/A;   AORTIC/RENAL BYPASS Left 06/09/2015   Procedure: LEFT RENAL Artery BYPASS;  Surgeon: Sherren Kerns, MD;  Location: [facility] OR;  Service: Vascular;  Laterality: Left;   APPENDECTOMY  2004   CHOLECYSTECTOMY N/A 09/18/2018   Procedure: LAPAROSCOPIC CHOLECYSTECTOMY;  Surgeon: Franky Macho, MD;  Location: [facility] ORS;  Service: General;  Laterality: N/A;   COLONOSCOPY  06/17/2011   INCOMPLETE, PREP POOR. Procedure: COLONOSCOPY;  Surgeon: Corbin Ade, MD;  Location: [facility] ENDO SUITE;  Service: Endoscopy;  Laterality: N/A;  10:00    COLONOSCOPY  07/15/2011   NGE:[id number] rectal and colon polyps   COLONOSCOPY N/A 05/22/2015   GMW:[id number] colonic and rectal polyps. tubular adenomas.repeat TCS 05/2018   ESOPHAGEAL DILATION N/A 05/22/2015   Procedure: ESOPHAGEAL DILATION;  Surgeon: Corbin Ade, MD;  Location: [facility] ENDO SUITE;  Service: Endoscopy;  Laterality: N/A;   ESOPHAGOGASTRODUODENOSCOPY  06/17/2011   severe erosive/ulcerative reflux esophagitis, soft noncritical stricture dilatied, small hh, antral erosion    ESOPHAGOGASTRODUODENOSCOPY N/A 05/22/2015   RMR: 2 cm HH otherwise normal. s/p empirical dilation.   INSERTION OF DIALYSIS CATHETER Left 06/26/2015   Procedure: INSERTION OF DIALYSIS CATHETER;  Surgeon: Chuck Hint, MD;  Location: [facility] OR;  Service: Vascular;  Laterality: Left;   PERIPHERAL VASCULAR CATHETERIZATION N/A 05/28/2015   Procedure: Abdominal Aortogram;  Surgeon: Nada Libman, MD;  Location: [facility] INVASIVE CV LAB;  Service: Cardiovascular;  Laterality: N/A;   testicular cancer  1990   right  orchiectomy   Family History  Problem Relation Age of Onset   Colon cancer Father 35       deceased   Prostate cancer Father    Heart disease Mother    Liver disease Neg Hx    Anesthesia problems Neg Hx    Hypotension Neg Hx    Malignant hyperthermia Neg Hx    Pseudochol deficiency Neg Hx    Social History   Socioeconomic History   Marital status: Married    Spouse name: Not on file   Number of children: 2   Years of education: Not on file   Highest education level: Not on file  Occupational History   Occupation: disabled    Employer: UNEMPLOYED  Tobacco Use   Smoking status: Former    Current packs/day: 0.00    Average packs/day: 0.5 packs/day for 40.0 years (20.0 ttl pk-yrs)    Types: Cigarettes    Start date: 01/04/1982    Quit date: 01/04/2022    Years since quitting: 1.2   Smokeless tobacco: Never   Tobacco comments:    1/2 pack daily  Vaping Use   Vaping status: Never Used  Substance  and Sexual Activity   Alcohol use: No    Alcohol/week: 0.0 standard drinks of alcohol   Drug use: No   Sexual activity: Yes    Birth control/protection: None  Other Topics Concern   Not on file  Social History Narrative   Not on file   Social Drivers of Health   Financial Resource Strain: Low Risk  (04/20/2023)   Overall Financial Resource Strain (CARDIA)    Difficulty of Paying Living Expenses: Not hard at all  Food Insecurity: No Food Insecurity (04/20/2023)   Hunger Vital Sign    Worried About Running Out of Food in the Last Year: Never true    Ran Out of Food in the Last Year: Never true  Transportation Needs: No Transportation Needs (04/20/2023)   PRAPARE - Administrator, Civil Service (Medical): No    Lack of Transportation (Non-Medical): No  Physical Activity: Insufficiently Active (04/20/2023)   Exercise Vital Sign    Days of Exercise per Week: 3 days    Minutes of Exercise per Session: 30 min  Stress: No Stress Concern Present (04/20/2023)   [employer] of Occupational Health - Occupational Stress Questionnaire    Feeling of Stress : Not at all  Social Connections: Socially Integrated (04/20/2023)   Social Connection and Isolation Panel [NHANES]    Frequency of Communication with Friends and Family: More than three times a week    Frequency of Social Gatherings with Friends and Family: More than three times a week    Attends Religious Services: More than 4 times per year    Active Member of [employer] or Organizations: Yes    Attends Engineer, structural: More than 4 times per year    Marital Status: Married    Tobacco Counseling Counseling given: Not Answered Tobacco comments: 1/2 pack daily   Clinical Intake:  Pre-visit preparation completed: Yes  Pain : No/denies pain Pain Score: 0-No pain     BMI - recorded: 23.73 Nutritional Status: BMI of 19-24  Normal Nutritional Risks: None Diabetes: No  How often do you need to have  someone help you when you read instructions, pamphlets, or other written materials from your doctor or pharmacy?: 1 - Never What is the last grade level you completed in school?: HSG  Interpreter Needed?: No  Information entered by :: Antonios Ostrow N. Japheth Diekman, LPN.   Activities of Daily Living    04/20/2023    9:09 AM  In your present state of health, do you have any difficulty performing the following activities:  Hearing? 0  Vision? 0  Difficulty concentrating or making decisions? 0  Walking or climbing stairs? 0  Dressing or bathing? 0  Doing errands, shopping? 0  Preparing Food and eating ? N  Using the Toilet? N  In the past six months, have you accidently leaked urine? N  Do you have problems with loss of bowel control? N  Managing your Medications? N  Managing your Finances? N  Housekeeping or managing your Housekeeping? N    Patient Care Team: Anabel Halon, MD as PCP - General (Internal Medicine) Wyline Mood Dorothe Pea, MD as PCP - Cardiology (Cardiology) Jena Gauss Gerrit Friends, MD (Gastroenterology) [facility], [facility] Randa Lynn, MD as Consulting Physician (Nephrology)  Indicate any recent Medical Services you may have received from other than [facility] providers in the past year (date may be approximate).     Assessment:   This is a routine wellness examination for [place].  Hearing/Vision screen Hearing Screening - Comments:: Denies hearing difficulties.   Vision Screening - Comments:: Wears reading glasses - not up to date with routine eye exams.   Goals Addressed             This Visit's Progress    Client understands the importance of follow-up with providers by attending scheduled visits.        Depression Screen    04/20/2023    9:06 AM 10/12/2022    2:05 PM 02/22/2022    3:47 PM 02/22/2022    3:42 PM 08/03/2021    9:47 AM 05/21/2021   11:44 AM 05/04/2021   10:20 AM  PHQ 2/9 Scores  PHQ - 2 Score 0 0 0 0 0 0 0  PHQ- 9 Score 4           Fall Risk    04/20/2023    9:06 AM 10/12/2022    2:05 PM 02/22/2022    3:47 PM 02/22/2022    3:42 PM 08/03/2021    9:47 AM  Fall Risk   Falls in the past year? 0 0 0 0 0  Number falls in past yr: 0 0 0 0 0  Injury with Fall? 0 0 0 0 0  Risk for fall due to : No Fall Risks    No Fall Risks  Follow up Falls prevention discussed    Falls evaluation completed    MEDICARE RISK AT HOME: Medicare Risk at Home Any stairs in or around the home?: No If so, are there any without handrails?: No Home free of loose throw rugs in walkways, pet beds, electrical cords, etc?: Yes Adequate lighting in your home to reduce risk of falls?: Yes Life alert?: No Use of a cane, walker or w/c?: No Grab bars in the bathroom?: Yes Shower chair or bench in shower?: Yes Elevated toilet seat or a handicapped toilet?: No  TIMED UP AND GO:  Was the test performed?  No    Cognitive Function:    04/20/2023    9:08 AM  MMSE - Mini Mental State Exam  Not completed: Unable to complete        04/20/2023    9:08 AM 02/22/2022    3:50 PM 02/16/2021    3:39 PM  6CIT Screen  What Year? 0 points 0  points 0 points  What month? 0 points 0 points 0 points  What time? 0 points 0 points 0 points  Count back from 20 0 points 0 points 0 points  Months in reverse 0 points 4 points 4 points  Repeat phrase 0 points 2 points 4 points  Total Score 0 points 6 points 8 points    Immunizations Immunization History  Administered Date(s) Administered   Fluad Quad(high Dose 65+) 01/22/2020, 02/17/2021   Influenza, High Dose Seasonal PF 01/26/2018   Influenza-Unspecified 01/24/2014, 02/22/2015, 01/12/2016, 02/21/2017   Moderna Sars-Covid-2 Vaccination 09/20/2019, 12/13/2019   Pneumococcal Conjugate-13 01/12/2016, 03/07/2018   Pneumococcal Polysaccharide-23 02/21/2017, 01/22/2020   Tdap 05/20/2011   Zoster Recombinant(Shingrix) 10/02/2020, 02/17/2021    TDAP status: Due, Education has been provided regarding the  importance of this vaccine. Advised may receive this vaccine at local pharmacy or Health Dept. Aware to provide a copy of the vaccination record if obtained from local pharmacy or Health Dept. Verbalized acceptance and understanding.  Flu Vaccine status: Due, Education has been provided regarding the importance of this vaccine. Advised may receive this vaccine at local pharmacy or Health Dept. Aware to provide a copy of the vaccination record if obtained from local pharmacy or Health Dept. Verbalized acceptance and understanding.  Pneumococcal vaccine status: Up to date  Covid-19 vaccine status: Completed vaccines  Qualifies for Shingles Vaccine? Yes   Zostavax completed No   Shingrix Completed?: Yes  Screening Tests Health Maintenance  Topic Date Due   DTaP/Tdap/Td (2 - Td or Tdap) 05/19/2021   Lung Cancer Screening  05/12/2022   INFLUENZA VACCINE  12/02/2022   COVID-19 Vaccine (3 - 2024-25 season) 01/02/2023   Medicare Annual Wellness (AWV)  04/19/2024   Colonoscopy  05/21/2025   Pneumonia Vaccine 9+ Years old  Completed   Hepatitis C Screening  Completed   Zoster Vaccines- Shingrix  Completed   HPV VACCINES  Aged Out    Health Maintenance  Health Maintenance Due  Topic Date Due   DTaP/Tdap/Td (2 - Td or Tdap) 05/19/2021   Lung Cancer Screening  05/12/2022   INFLUENZA VACCINE  12/02/2022   COVID-19 Vaccine (3 - 2024-25 season) 01/02/2023    Colorectal cancer screening: Type of screening: Colonoscopy. Completed 05/22/2015. Repeat every 10 years  Lung Cancer Screening: (Low Dose CT Chest recommended if Age 27-80 years, 20 pack-year currently smoking OR have quit w/in 15years.) does qualify.   Lung Cancer Screening Referral: ordered 04/20/2023  Additional Screening:  Hepatitis C Screening: does qualify; Completed 12/28/2016  Vision Screening: Recommended annual ophthalmology exams for early detection of glaucoma and other disorders of the eye. Is the patient up to date  with their annual eye exam?  No  Who is the provider or what is the name of the office in which the patient attends annual eye exams? None If pt is not established with a provider, would they like to be referred to a provider to establish care? Yes .   Dental Screening: Recommended annual dental exams for proper oral hygiene  Community Resource Referral / Chronic Care Management: CRR required this visit?  No   CCM required this visit?  Appt scheduled with PCP     Plan:     I have personally reviewed and noted the following in the patient's chart:   Medical and social history Use of alcohol, tobacco or illicit drugs  Current medications and supplements including opioid prescriptions. Patient is not currently taking opioid prescriptions. Functional ability and status Nutritional  status Physical activity Advanced directives List of other physicians Hospitalizations, surgeries, and ER visits in previous 12 months Vitals Screenings to include cognitive, depression, and falls Referrals and appointments  In addition, I have reviewed and discussed with patient certain preventive protocols, quality metrics, and best practice recommendations. A written personalized care plan for preventive services as well as general preventive health recommendations were provided to patient.     Mickeal Needy, LPN   16/02/9603   After Visit Summary: (Mail) Due to this being a telephonic visit, the after visit summary with patients personalized plan was offered to patient via mail   Nurse Notes: Patient is requesting referral to optometrist and pulmonology for lung cancer screening.

## 2023-04-20 NOTE — Patient Instructions (Addendum)
[name] [name] , Thank you for taking time to come for your Medicare Wellness Visit. I appreciate your ongoing commitment to your health goals. Please review the following plan we discussed and let me know if I can assist you in the future.   Referrals/Orders/Follow-Ups/Clinician Recommendations: Order placed for lung cancer screening to [facility] Pulmonology.  Office will call you to schedule.  This is a list of the screening recommended for you and due dates:  Health Maintenance  Topic Date Due   DTaP/Tdap/Td vaccine (2 - Td or Tdap) 05/19/2021   Screening for Lung Cancer  05/12/2022   Flu Shot  12/02/2022   COVID-19 Vaccine (3 - 2024-25 season) 01/02/2023   Medicare Annual Wellness Visit  04/19/2024   Colon Cancer Screening  05/21/2025   Pneumonia Vaccine  Completed   Hepatitis C Screening  Completed   Zoster (Shingles) Vaccine  Completed   HPV Vaccine  Aged Out    Advanced directives: (Declined) Advance directive discussed with you today. Even though you declined this today, please call our office should you change your mind, and we can give you the proper paperwork for you to fill out.  Next Medicare Annual Wellness Visit scheduled for next year: Yes

## 2023-04-21 ENCOUNTER — Encounter: Payer: Self-pay | Admitting: Internal Medicine

## 2023-04-21 ENCOUNTER — Ambulatory Visit (INDEPENDENT_AMBULATORY_CARE_PROVIDER_SITE_OTHER): Payer: 59 | Admitting: Internal Medicine

## 2023-04-21 VITALS — BP 123/54 | HR 68 | Ht 72.0 in | Wt 171.4 lb

## 2023-04-21 DIAGNOSIS — R1900 Intra-abdominal and pelvic swelling, mass and lump, unspecified site: Secondary | ICD-10-CM

## 2023-04-21 DIAGNOSIS — I480 Paroxysmal atrial fibrillation: Secondary | ICD-10-CM | POA: Diagnosis not present

## 2023-04-21 DIAGNOSIS — F5101 Primary insomnia: Secondary | ICD-10-CM

## 2023-04-21 DIAGNOSIS — J449 Chronic obstructive pulmonary disease, unspecified: Secondary | ICD-10-CM

## 2023-04-21 DIAGNOSIS — I10 Essential (primary) hypertension: Secondary | ICD-10-CM | POA: Diagnosis not present

## 2023-04-21 DIAGNOSIS — E559 Vitamin D deficiency, unspecified: Secondary | ICD-10-CM | POA: Diagnosis not present

## 2023-04-21 DIAGNOSIS — R2232 Localized swelling, mass and lump, left upper limb: Secondary | ICD-10-CM

## 2023-04-21 DIAGNOSIS — D631 Anemia in chronic kidney disease: Secondary | ICD-10-CM | POA: Diagnosis not present

## 2023-04-21 DIAGNOSIS — N186 End stage renal disease: Secondary | ICD-10-CM | POA: Diagnosis not present

## 2023-04-21 DIAGNOSIS — E213 Hyperparathyroidism, unspecified: Secondary | ICD-10-CM | POA: Diagnosis not present

## 2023-04-21 DIAGNOSIS — I2510 Atherosclerotic heart disease of native coronary artery without angina pectoris: Secondary | ICD-10-CM

## 2023-04-21 DIAGNOSIS — N185 Chronic kidney disease, stage 5: Secondary | ICD-10-CM | POA: Diagnosis not present

## 2023-04-21 DIAGNOSIS — Z1159 Encounter for screening for other viral diseases: Secondary | ICD-10-CM | POA: Diagnosis not present

## 2023-04-21 DIAGNOSIS — R351 Nocturia: Secondary | ICD-10-CM

## 2023-04-21 DIAGNOSIS — N401 Enlarged prostate with lower urinary tract symptoms: Secondary | ICD-10-CM

## 2023-04-21 DIAGNOSIS — Z23 Encounter for immunization: Secondary | ICD-10-CM | POA: Diagnosis not present

## 2023-04-21 MED ORDER — TRAZODONE HCL 50 MG PO TABS: 25.0000 mg | ORAL_TABLET | Freq: Every evening | ORAL | 3 refills | Status: DC | PRN

## 2023-04-21 MED ORDER — NITROGLYCERIN 0.4 MG SL SUBL: SUBLINGUAL_TABLET | SUBLINGUAL | 2 refills | Status: DC

## 2023-04-21 NOTE — Assessment & Plan Note (Signed)
BP Readings from Last 1 Encounters:  04/21/23 (!) 123/54   Usually well-controlled with Amlodipine, Metoprolol and Hydralazine - followed by Cardiology and Nephrology Counseled for compliance with the medications Advised DASH diet and moderate exercise/walking as tolerated

## 2023-04-21 NOTE — Assessment & Plan Note (Signed)
Soft, nontender mass Could be cyst versus lipoma Check [place] of left axilla

## 2023-04-21 NOTE — Assessment & Plan Note (Signed)
On Metoprolol for rate control ?On Coumadin ?Follows up with [facility] clinic ?

## 2023-04-21 NOTE — Patient Instructions (Addendum)
Please start taking Trazodone as needed for insomnia.  Please maintain simple sleep hygiene. - Maintain dark and non-noisy environment in the bedroom. - Please use the bedroom for sleep and sexual activity only. - Do not use electronic devices in the bedroom. - Please take dinner at least 2 hours before bedtime. - Please avoid caffeinated products in the evening, including coffee, soft drinks. - Please try to maintain the regular sleep-wake cycle - Go to bed and wake up at the same time.  Please continue to take medications as prescribed.  Please continue to follow low salt diet and ambulate as tolerated.

## 2023-04-21 NOTE — Assessment & Plan Note (Signed)
F/u with Nephrology - Dr Webb - needs a f/u visit °Avoid nephrotoxic agents °On Calcitriol °On iron supplements °On Lasix °

## 2023-04-21 NOTE — Assessment & Plan Note (Addendum)
On Flomax Checked PSA - wnl

## 2023-04-21 NOTE — Progress Notes (Signed)
Established Patient Office Visit  Subjective:  Patient ID: [name] [name], male    DOB: 1951/03/26  Age: 72 y.o. MRN: [id number]  CC:  Chief Complaint  Patient presents with   buldge    Buldge on back and armpit     HPI [name] [name] is a 72 y.o. male with past medical history of CAD s/p stent placement, AAA s/p repair, paroxysmal A Fib, CKD stage 4, HTN, COPD, GERD and HLD who presents for f/u of his chronic medical conditions.  HTN and A Fib: His BP was well controlled today.  He denies any chest pain, dyspnea or palpitations currently.  He follows up with cardiology for history of CHF and nephrology for history of CKD.  He takes metoprolol and Coumadin for A-fib.  COPD: He states that his breathing is better now.  He has not required albuterol recently.  He was placed on Trelegy, but has not been compliant to it.  Denies any dyspnea or wheezing currently.   ESRD: Last CMP reviewed. He sees Dr. Wolfgang Phoenix and is planning to start peritoneal dialysis. He takes calcitriol and sodium bicarbonate currently.  He has a left axillary mass and the right flank area mass for the last 1 year.  They have been enlarging chronically, denies any pain in the areas.  Denies any recent injury.  He reports difficulty maintaining sleep.  He is able to sleep only for 2 hours at times.  Denies anhedonia, anxiety spells, SI or HI currently.  Past Medical History:  Diagnosis Date   AAA (abdominal aortic aneurysm) (HCC)    a. s/p repair 06/2015 with left renal artery bypass at that time, post-op course c/b renal failure requiring dialysis, C dif.   Anemia    Arteriosclerotic cardiovascular disease (ASCVD)    a. AMI in 2000 treated at [facility]. b. cath in 12/2006->  Chronic total obstruction of the RCA;  drug-eluting stent placed in OM1, LVEF abnormal.   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   PVD (peripheral vascular disease) (HCC)    Ct angiogram in 2009 revealed stable disease with 80% celiac stenosis,50% right renal artery ,ASCVD with ulceration in the abdominal aortashe   Testicular carcinoma (HCC) 1990   right orchiectomy   Tobacco abuse, in remission    20 pack years; quit in 2009    Past Surgical History:  Procedure Laterality Date   ABDOMINAL AORTIC ANEURYSM REPAIR N/A 06/09/2015   Procedure: ANEURYSM ABDOMINAL AORTIC REPAIR;  Surgeon: Sherren Kerns, MD;  Location: [facility] OR;  Service: Vascular;  Laterality: N/A;   AORTIC ENDARTERECETOMY N/A 06/09/2015   Procedure: AORTIC ENDARTERECETOMY;  Surgeon: Sherren Kerns, MD;  Location: [facility] OR;  Service: Vascular;  Laterality: N/A;   AORTIC/RENAL BYPASS Left 06/09/2015   Procedure: LEFT RENAL Artery BYPASS;  Surgeon: Sherren Kerns, MD;  Location: [facility] OR;  Service: Vascular;  Laterality: Left;   APPENDECTOMY  2004   CHOLECYSTECTOMY N/A 09/18/2018   Procedure:  LAPAROSCOPIC CHOLECYSTECTOMY;  Surgeon: Franky Macho, MD;  Location: [facility] ORS;  Service: General;  Laterality: N/A;   COLONOSCOPY  06/17/2011   INCOMPLETE, PREP POOR. Procedure: COLONOSCOPY;  Surgeon: Corbin Ade, MD;  Location: [facility] ENDO SUITE;  Service: Endoscopy;  Laterality: N/A;  10:00   COLONOSCOPY  07/15/2011   HQI:[id number] rectal and colon polyps   COLONOSCOPY N/A 05/22/2015   UXL:[id number] colonic and rectal polyps. tubular adenomas.repeat TCS 05/2018   ESOPHAGEAL DILATION N/A 05/22/2015   Procedure: ESOPHAGEAL DILATION;  Surgeon: Corbin Ade, MD;  Location: [facility] ENDO SUITE;   Service: Endoscopy;  Laterality: N/A;   ESOPHAGOGASTRODUODENOSCOPY  06/17/2011   severe erosive/ulcerative reflux esophagitis, soft noncritical stricture dilatied, small hh, antral erosion    ESOPHAGOGASTRODUODENOSCOPY N/A 05/22/2015   RMR: 2 cm HH otherwise normal. s/p empirical dilation.   INSERTION OF DIALYSIS CATHETER Left 06/26/2015   Procedure: INSERTION OF DIALYSIS CATHETER;  Surgeon: Chuck Hint, MD;  Location: [facility] OR;  Service: Vascular;  Laterality: Left;   PERIPHERAL VASCULAR CATHETERIZATION N/A 05/28/2015   Procedure: Abdominal Aortogram;  Surgeon: Nada Libman, MD;  Location: [facility] INVASIVE CV LAB;  Service: Cardiovascular;  Laterality: N/A;   testicular cancer  1990   right orchiectomy    Family History  Problem Relation Age of Onset   Colon cancer Father 34       deceased   Prostate cancer Father    Heart disease Mother    Liver disease Neg Hx    Anesthesia problems Neg Hx    Hypotension Neg Hx    Malignant hyperthermia Neg Hx    Pseudochol deficiency Neg Hx     Social History   Socioeconomic History   Marital status: Married    Spouse name: Not on file   Number of children: 2   Years of education: Not on file   Highest education level: Not on file  Occupational History   Occupation: disabled    Employer: UNEMPLOYED  Tobacco Use   Smoking status: Former    Current packs/day: 0.00    Average packs/day: 0.5 packs/day for 40.0 years (20.0 ttl pk-yrs)    Types: Cigarettes    Start date: 01/04/1982    Quit date: 01/04/2022    Years since quitting: 1.2   Smokeless tobacco: Never   Tobacco comments:    1/2 pack daily  Vaping Use   Vaping status: Never Used  Substance and Sexual Activity   Alcohol use: No    Alcohol/week: 0.0 standard drinks of alcohol   Drug use: No   Sexual activity: Yes    Birth control/protection: None  Other Topics Concern   Not on file  Social History Narrative   Not on file   Social Drivers of Health   Financial Resource  Strain: Low Risk  (04/20/2023)   Overall Financial Resource Strain (CARDIA)    Difficulty of Paying Living Expenses: Not hard at all  Food Insecurity: No Food Insecurity (04/20/2023)   Hunger Vital Sign    Worried About Running Out of Food in the Last Year: Never true    Ran Out of Food in the Last Year: Never true  Transportation Needs: No Transportation Needs (04/20/2023)   PRAPARE - Administrator, Civil Service (Medical): No    Lack of Transportation (Non-Medical): No  Physical Activity: Insufficiently Active (04/20/2023)   Exercise Vital Sign    Days of Exercise per Week: 3 days    Minutes of Exercise per Session: 30 min  Stress: No Stress Concern Present (04/20/2023)   [employer] of Occupational Health - Occupational Stress Questionnaire    Feeling of Stress : Not at all  Social Connections: Socially Integrated (04/20/2023)   Social Connection and Isolation Panel [NHANES]    Frequency of Communication with Friends and Family: More than three times a week    Frequency of Social Gatherings with Friends and Family: More than three times a week    Attends Religious Services: More than 4 times per year    Active Member of [employer] or Organizations: Yes    Attends Engineer, structural: More than 4 times per year    Marital Status: Married  Catering manager Violence: Not At Risk (04/20/2023)   Humiliation, Afraid, Rape, and Kick questionnaire    Fear of Current or Ex-Partner: No    Emotionally Abused: No    Physically Abused: No    Sexually Abused: No    Outpatient Medications Prior to Visit  Medication Sig Dispense Refill   albuterol (PROVENTIL) (2.5 MG/3ML) 0.083% nebulizer solution USE 1 VIAL IN NEBULIZER EVERY 4 HOURS AS NEEDED FOR WHEEZING OR SHORTNESS OF BREATH 180 mL 0   albuterol (VENTOLIN HFA) 108 (90 Base) MCG/ACT inhaler INHALE 1 TO 2 PUFFS BY MOUTH EVERY 6 HOURS AS NEEDED FOR WHEEZING OR SHORTNESS OF BREATH 9 g 0   amLODipine (NORVASC) 10 MG  tablet Take 1 tablet (10 mg total) by mouth daily. 90 tablet 3   atorvastatin (LIPITOR) 20 MG tablet Take 1 tablet by mouth once daily 90 tablet 0   calcitRIOL (ROCALTROL) 0.25 MCG capsule Take 1 capsule (0.25 mcg total) by mouth daily. 30 capsule 0   fluticasone (FLONASE) 50 MCG/ACT nasal spray Place 1 spray into both nostrils daily as needed for allergies or rhinitis.     Fluticasone-Umeclidin-Vilant (TRELEGY ELLIPTA) 100-62.5-25 MCG/ACT AEPB Inhale 1 puff into the lungs daily. 1 each 11   furosemide (LASIX) 40 MG tablet TAKE 1/2 TO 1 (ONE-HALF TO ONE) TABLET BY MOUTH ONCE DAILY ALTERNATING  BETWEEN  1/2  ONE  DAY  AND  1  TABLET  THE  NEXT 45 tablet 1   hydrALAZINE (APRESOLINE) 100 MG tablet TAKE 1 TABLET BY MOUTH THREE TIMES DAILY 270 tablet 2   metoprolol succinate (TOPROL-XL) 100 MG 24 hr tablet TAKE 1.5 TABLETS BY MOUTH ONCE DAILY 135 tablet 2   pantoprazole (PROTONIX) 40 MG tablet Take 1 tablet (40 mg total) by mouth daily. 90 tablet 3   sodium bicarbonate 650 MG tablet Take 650 mg by mouth 3 (three) times daily.     tamsulosin (FLOMAX) 0.4 MG CAPS capsule Take 1 capsule (0.4 mg total) by mouth daily after supper. 90 capsule 3   warfarin (COUMADIN) 5 MG tablet TAKE 1 TO 1 & 1/2 (ONE & ONE-HALF) TABLETS BY MOUTH ONCE DAILY AS DIRECTED BY THE COUMADIN CLINIC 100 tablet 1   nitroGLYCERIN (NITROSTAT) 0.4 MG SL tablet place 1 tablet under the tongue if needed every 5 minutes for chest pain for 3 doses IF NO RELIEF AFTER 3RD DOSE CALL PRESCRIBER OR 911. 25 tablet 2   No facility-administered medications prior to visit.    No Known Allergies  ROS Review of Systems  Constitutional:  Negative for chills and fever.  HENT:  Negative for congestion and sore throat.   Eyes:  Negative for pain and discharge.  Respiratory:  Positive for cough (chronic). Negative for shortness of breath and wheezing.   Cardiovascular:  Negative for chest  pain and palpitations.  Gastrointestinal:  Negative for  constipation, diarrhea, nausea and vomiting.  Endocrine: Negative for polydipsia and polyuria.  Genitourinary:  Negative for dysuria and hematuria.  Musculoskeletal:  Negative for neck pain and neck stiffness.  Skin:  Negative for rash.  Neurological:  Negative for dizziness, weakness, numbness and headaches.  Psychiatric/Behavioral:  Negative for agitation and behavioral problems.       Objective:    Physical Exam Vitals reviewed.  Constitutional:      General: He is not in acute distress.    Appearance: He is not diaphoretic.  HENT:     Head: Normocephalic and atraumatic.     Nose: Nose normal.     Mouth/Throat:     Mouth: Mucous membranes are moist.  Eyes:     General: No scleral icterus.    Extraocular Movements: Extraocular movements intact.  Cardiovascular:     Rate and Rhythm: Normal rate and regular rhythm.     Heart sounds: Normal heart sounds. No murmur heard. Pulmonary:     Breath sounds: No wheezing or rales.  Abdominal:     Palpations: Abdomen is soft.     Tenderness: There is no abdominal tenderness.  Musculoskeletal:     Cervical back: Neck supple. No tenderness.     Right lower leg: No edema.     Left lower leg: No edema.  Skin:    General: Skin is warm.     Findings: No rash.     Comments: Left axillary mass, about 2 cm in diameter, nontender Right flank mass, about 3 cm in diameter, nontender, mobile  Neurological:     General: No focal deficit present.     Mental Status: He is alert and oriented to person, place, and time.     Sensory: No sensory deficit.     Motor: No weakness.  Psychiatric:        Mood and Affect: Mood normal.        Behavior: Behavior normal.     BP (!) 123/54 (BP Location: Left Arm, Patient Position: Sitting, Cuff Size: Normal)   Pulse 68   Ht 6' (1.829 m)   Wt 171 lb 6.4 oz (77.7 kg)   SpO2 96%   BMI 23.25 kg/m  Wt Readings from Last 3 Encounters:  04/21/23 171 lb 6.4 oz (77.7 kg)  04/20/23 175 lb (79.4 kg)   03/24/23 174 lb 3.2 oz (79 kg)    Lab Results  Component Value Date   TSH 0.790 04/05/2023   Lab Results  Component Value Date   WBC 5.6 04/05/2023   HGB 9.1 (L) 04/05/2023   HCT 28.7 (L) 04/05/2023   MCV 94 04/05/2023   PLT 144 (L) 04/05/2023   Lab Results  Component Value Date   NA 143 04/05/2023   K 3.6 04/05/2023   CO2 18 (L) 04/05/2023   GLUCOSE 104 (H) 04/05/2023   BUN 42 (H) 04/05/2023   CREATININE 4.45 (H) 04/05/2023   BILITOT 0.5 04/05/2023   ALKPHOS 102 04/05/2023   AST 13 04/05/2023   ALT 11 04/05/2023   PROT 5.9 (L) 04/05/2023   ALBUMIN 3.6 (L) 04/05/2023   CALCIUM 9.3 04/05/2023   ANIONGAP 11 03/25/2023   EGFR 13 (L) 04/05/2023   Lab Results  Component Value Date   CHOL 106 04/05/2023   Lab Results  Component Value Date   HDL 27 (L) 04/05/2023   Lab Results  Component Value Date   LDLCALC 53 04/05/2023   Lab  Results  Component Value Date   TRIG 149 04/05/2023   Lab Results  Component Value Date   CHOLHDL 3.9 04/05/2023   Lab Results  Component Value Date   HGBA1C 4.8 04/05/2023      Assessment & Plan:   Problem List Items Addressed This Visit       Cardiovascular and Mediastinum   Essential hypertension - Primary (Chronic)   BP Readings from Last 1 Encounters:  04/21/23 (!) 123/54   Usually well-controlled with Amlodipine, Metoprolol and Hydralazine - followed by Cardiology and Nephrology Counseled for compliance with the medications Advised DASH diet and moderate exercise/walking as tolerated      Relevant Medications   nitroGLYCERIN (NITROSTAT) 0.4 MG SL tablet   CAD - s/p PCI to Cx. 100% RCA CTO (Chronic)   Relevant Medications   nitroGLYCERIN (NITROSTAT) 0.4 MG SL tablet   Paroxysmal A-fib (HCC)   On Metoprolol for rate control On Coumadin Follows up with [facility] clinic      Relevant Medications   nitroGLYCERIN (NITROSTAT) 0.4 MG SL tablet     Respiratory   COPD   Quit smoking in 2022 Well-controlled now, advised  to stay compliant with Trelegy and use albuterol as needed        Genitourinary   ESRD (end stage renal disease) (HCC)   F/u with Nephrology - Dr Hyman Hopes - needs a f/u visit Avoid nephrotoxic agents On Calcitriol On iron supplements On Lasix      BPH (benign prostatic hyperplasia)   On Flomax Checked PSA - wnl        Other   Insomnia   Chronic insomnia, started trazodone as needed for insomnia Sleep hygiene material provided      Relevant Medications   traZODone (DESYREL) 50 MG tablet   Right flank mass   Soft, nontender, mobile mass - but has irregular borders Could be lipoma Due to irregular borders, check [place] of flank area      Relevant Orders   US Abdomen Limited   Axillary mass, left   Soft, nontender mass Could be cyst versus lipoma Check [place] of left axilla      Relevant Orders   [place] AXILLA LEFT   Other Visit Diagnoses       Encounter for immunization       Relevant Orders   Flu Vaccine Trivalent High Dose (Fluad) (Completed)        Meds ordered this encounter  Medications   nitroGLYCERIN (NITROSTAT) 0.4 MG SL tablet    Sig: place 1 tablet under the tongue if needed every 5 minutes for chest pain for 3 doses IF NO RELIEF AFTER 3RD DOSE CALL PRESCRIBER OR 911.    Dispense:  10 tablet    Refill:  2   traZODone (DESYREL) 50 MG tablet    Sig: Take 0.5-1 tablets (25-50 mg total) by mouth at bedtime as needed for sleep.    Dispense:  30 tablet    Refill:  3    Follow-up: Return in about 6 months (around 10/20/2023).    Anabel Halon, MD

## 2023-04-21 NOTE — Assessment & Plan Note (Addendum)
Quit smoking in 2022 Well-controlled now, advised to stay compliant with Trelegy and use albuterol as needed

## 2023-04-21 NOTE — Assessment & Plan Note (Signed)
Chronic insomnia, started trazodone as needed for insomnia Sleep hygiene material provided

## 2023-04-21 NOTE — Assessment & Plan Note (Signed)
Soft, nontender, mobile mass - but has irregular borders Could be lipoma Due to irregular borders, check [place] of flank area

## 2023-04-25 ENCOUNTER — Ambulatory Visit (HOSPITAL_COMMUNITY): Payer: 59

## 2023-04-26 ENCOUNTER — Ambulatory Visit (HOSPITAL_COMMUNITY)
Admission: RE | Admit: 2023-04-26 | Discharge: 2023-04-26 | Disposition: A | Discharge status: Home / Self Care | Payer: 59 | Source: Ambulatory Visit | Attending: Internal Medicine | Admitting: Internal Medicine

## 2023-04-26 ENCOUNTER — Other Ambulatory Visit: Payer: Self-pay | Admitting: Internal Medicine

## 2023-04-26 DIAGNOSIS — R1900 Intra-abdominal and pelvic swelling, mass and lump, unspecified site: Secondary | ICD-10-CM

## 2023-04-26 DIAGNOSIS — R2232 Localized swelling, mass and lump, left upper limb: Secondary | ICD-10-CM

## 2023-04-26 DIAGNOSIS — R222 Localized swelling, mass and lump, trunk: Secondary | ICD-10-CM | POA: Diagnosis not present

## 2023-05-02 ENCOUNTER — Other Ambulatory Visit: Payer: Self-pay | Admitting: Internal Medicine

## 2023-05-02 ENCOUNTER — Other Ambulatory Visit: Payer: Self-pay

## 2023-05-02 DIAGNOSIS — R2232 Localized swelling, mass and lump, left upper limb: Secondary | ICD-10-CM

## 2023-05-02 DIAGNOSIS — Z122 Encounter for screening for malignant neoplasm of respiratory organs: Secondary | ICD-10-CM

## 2023-05-02 DIAGNOSIS — F17210 Nicotine dependence, cigarettes, uncomplicated: Secondary | ICD-10-CM

## 2023-05-02 DIAGNOSIS — Z87891 Personal history of nicotine dependence: Secondary | ICD-10-CM

## 2023-05-10 ENCOUNTER — Ambulatory Visit (INDEPENDENT_AMBULATORY_CARE_PROVIDER_SITE_OTHER): Payer: 59 | Admitting: Acute Care

## 2023-05-10 ENCOUNTER — Ambulatory Visit: Payer: 59 | Attending: Cardiology | Admitting: *Deleted

## 2023-05-10 DIAGNOSIS — Z5181 Encounter for therapeutic drug level monitoring: Secondary | ICD-10-CM | POA: Diagnosis not present

## 2023-05-10 DIAGNOSIS — F17210 Nicotine dependence, cigarettes, uncomplicated: Secondary | ICD-10-CM

## 2023-05-10 DIAGNOSIS — I4891 Unspecified atrial fibrillation: Secondary | ICD-10-CM | POA: Diagnosis not present

## 2023-05-10 LAB — POCT INR: INR: 2.5 (ref 2.0–3.0)

## 2023-05-10 NOTE — Patient Instructions (Signed)

## 2023-05-10 NOTE — Progress Notes (Signed)
 Provider Attestation I agree with the documentation of the Shared Decision Making visit,  smoking cessation counseling if appropriate, and verification or eligibility for lung cancer screening as documented by the RN Nurse Navigator.   Raejean Bullock, MSN, AGACNP-BC [facility] See Amion for personal pager PCCM on call pager [phone]     Virtual Visit via Telephone Note  I connected with Xerxes E Auvil on 05/10/23 at  3:00 PM EST by telephone and verified that I am speaking with the correct person using two identifiers.  Location: Patient: in home Provider: [address], [place], [place], [address]    Shared Decision Making Visit Lung Cancer Screening Program [phone])   Eligibility: Age 73 y.o. Pack Years Smoking History Calculation 52 (# packs/per year x # years smoked) Recent History of coughing up blood  no Unexplained weight loss? no ( >Than 15 pounds within the last 6 months ) Prior History Lung / other cancer no (Diagnosis within the last 5 years already requiring surveillance chest CT Scans). Smoking Status Current Smoker Former Smokers: Years since quit:  NA  Quit Date: NA  Visit Components: Discussion included one or more decision making aids. yes Discussion included risk/benefits of screening. yes Discussion included potential follow up diagnostic testing for abnormal scans. yes Discussion included meaning and risk of over diagnosis. yes Discussion included meaning and risk of False Positives. yes Discussion included meaning of total radiation exposure. yes  Counseling Included: Importance of adherence to annual lung cancer LDCT screening. yes Impact of comorbidities on ability to participate in the program. yes Ability and willingness to under diagnostic treatment. yes  Smoking Cessation Counseling: Current Smokers:  Discussed importance of smoking cessation. yes Information about tobacco cessation classes and  interventions provided to patient. yes Patient provided with "ticket" for LDCT Scan. yes Symptomatic Patient. no  Counseling NA Diagnosis Code: Tobacco Use Z72.0 Asymptomatic Patient yes  Counseling (Intermediate counseling: > three minutes counseling) [id number] Former Smokers:  Discussed the importance of maintaining cigarette abstinence. yes Diagnosis Code: Personal History of Nicotine  Dependence. O13.086 Information about tobacco cessation classes and interventions provided to patient. Yes Patient provided with "ticket" for LDCT Scan. yes Written Order for Lung Cancer Screening with LDCT placed in Epic. Yes (CT Chest Lung Cancer Screening Low Dose W/O CM) [id number] Z12.2-Screening of respiratory organs Z87.891-Personal history of nicotine  dependence   Valentin Gaskins, RN 05/10/23

## 2023-05-10 NOTE — Patient Instructions (Signed)
 Continue warfarin 1 tablet daily  except 1/2 tablet on Sundays and Wednesdays Recheck INR in 4 weeks.

## 2023-05-12 ENCOUNTER — Other Ambulatory Visit: Payer: Self-pay | Admitting: Internal Medicine

## 2023-05-17 ENCOUNTER — Ambulatory Visit (INDEPENDENT_AMBULATORY_CARE_PROVIDER_SITE_OTHER): Payer: 59 | Admitting: General Surgery

## 2023-05-17 ENCOUNTER — Encounter: Payer: Self-pay | Admitting: General Surgery

## 2023-05-17 VITALS — BP 170/67 | HR 64 | Temp 97.8°F | Resp 16 | Ht 72.0 in | Wt 176.0 lb

## 2023-05-17 DIAGNOSIS — L723 Sebaceous cyst: Secondary | ICD-10-CM

## 2023-05-17 NOTE — Progress Notes (Signed)
 [name] [name]; [id number]; 1951-01-22   HPI Patient is a 73 year old white male who was referred to my care by Dr. Tobie for evaluation treatment of multiple subcutaneous nodules.  Patient states that he has had the back soft tissue mass for several years but it seems to be increasing in size.  Does not cause him discomfort.  He also has another subcutaneous nodule in the left axilla.  No growth has been noted.  No drainage has been noted.  He does note that he has multiple sebaceous cysts on his abdominal wall and back.  He is currently being evaluated for possible fistula placement for dialysis.  He is on Coumadin  chronically for coronary artery disease and congestive heart disease. Past Medical History:  Diagnosis Date   AAA (abdominal aortic aneurysm) (HCC)    a. s/p repair 06/2015 with left renal artery bypass at that time, post-op course c/b renal failure requiring dialysis, C dif.   Anemia    Arteriosclerotic cardiovascular disease (ASCVD)    a. AMI in 2000 treated at [facility]. b. cath in 12/2006->  Chronic total obstruction of the RCA;  drug-eluting stent placed in OM1, LVEF abnormal.   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   PVD (peripheral vascular disease) (HCC)    Ct angiogram in 2009 revealed stable disease with 80% celiac stenosis,50% right renal artery ,ASCVD with ulceration in the abdominal aortashe   Testicular carcinoma (HCC) 1990   right orchiectomy   Tobacco abuse, in  remission    20 pack years; quit in 2009    Past Surgical History:  Procedure Laterality Date   ABDOMINAL AORTIC ANEURYSM REPAIR N/A 06/09/2015   Procedure: ANEURYSM ABDOMINAL AORTIC REPAIR;  Surgeon: Carlin FORBES Haddock, MD;  Location: [facility] OR;  Service: Vascular;  Laterality: N/A;   AORTIC ENDARTERECETOMY N/A 06/09/2015   Procedure: AORTIC ENDARTERECETOMY;  Surgeon: Carlin FORBES Haddock, MD;  Location: [facility] OR;  Service: Vascular;  Laterality: N/A;   AORTIC/RENAL BYPASS Left 06/09/2015   Procedure: LEFT RENAL Artery BYPASS;  Surgeon: Carlin FORBES Haddock, MD;  Location: [facility] OR;  Service: Vascular;  Laterality: Left;   APPENDECTOMY  2004   CHOLECYSTECTOMY N/A 09/18/2018   Procedure: LAPAROSCOPIC CHOLECYSTECTOMY;  Surgeon: Mavis Anes, MD;  Location: [facility] ORS;  Service: General;  Laterality: N/A;   COLONOSCOPY  06/17/2011   INCOMPLETE, PREP POOR. Procedure: COLONOSCOPY;  Surgeon: Lamar CHRISTELLA Hollingshead, MD;  Location: [facility] ENDO SUITE;  Service: Endoscopy;  Laterality: N/A;  10:00   COLONOSCOPY  07/15/2011   MFM:floupeoz rectal and colon polyps   COLONOSCOPY N/A 05/22/2015   MFM:floupeoz colonic and rectal polyps. tubular adenomas.repeat TCS 05/2018   ESOPHAGEAL DILATION N/A 05/22/2015   Procedure: ESOPHAGEAL DILATION;  Surgeon: Lamar CHRISTELLA Hollingshead, MD;  Location: [facility] ENDO SUITE;  Service: Endoscopy;  Laterality: N/A;   ESOPHAGOGASTRODUODENOSCOPY  06/17/2011   severe erosive/ulcerative reflux esophagitis, soft noncritical stricture dilatied, small hh, antral erosion    ESOPHAGOGASTRODUODENOSCOPY N/A 05/22/2015   RMR: 2 cm HH  otherwise normal. s/p empirical dilation.   INSERTION OF DIALYSIS CATHETER Left 06/26/2015   Procedure: INSERTION OF DIALYSIS CATHETER;  Surgeon: Lonni GORMAN Blade, MD;  Location: [facility] OR;  Service: Vascular;  Laterality: Left;   PERIPHERAL VASCULAR CATHETERIZATION N/A 05/28/2015   Procedure: Abdominal Aortogram;  Surgeon: Gaile LELON New, MD;  Location: [facility] INVASIVE CV LAB;  Service: Cardiovascular;  Laterality:  N/A;   testicular cancer  1990   right orchiectomy    Family History  Problem Relation Age of Onset   Colon cancer Father 59       deceased   Prostate cancer Father    Heart disease Mother    Liver disease Neg Hx    Anesthesia problems Neg Hx    Hypotension Neg Hx    Malignant hyperthermia Neg Hx    Pseudochol deficiency Neg Hx     Current Outpatient Medications on File Prior to Visit  Medication Sig Dispense Refill   albuterol  (PROVENTIL ) (2.5 MG/3ML) 0.083% nebulizer solution USE 1 VIAL IN NEBULIZER EVERY 4 HOURS AS NEEDED FOR WHEEZING OR SHORTNESS OF BREATH 180 mL 0   albuterol  (VENTOLIN  HFA) 108 (90 Base) MCG/ACT inhaler INHALE 1 TO 2 PUFFS BY MOUTH EVERY 6 HOURS AS NEEDED FOR WHEEZING OR SHORTNESS OF BREATH 9 g 0   amLODipine  (NORVASC ) 10 MG tablet Take 1 tablet (10 mg total) by mouth daily. 90 tablet 3   atorvastatin  (LIPITOR ) 20 MG tablet Take 1 tablet by mouth once daily 90 tablet 0   calcitRIOL  (ROCALTROL ) 0.25 MCG capsule Take 1 capsule (0.25 mcg total) by mouth daily. 30 capsule 0   fluticasone  (FLONASE ) 50 MCG/ACT nasal spray Place 1 spray into both nostrils daily as needed for allergies or rhinitis.     Fluticasone -Umeclidin-Vilant (TRELEGY ELLIPTA ) 100-62.5-25 MCG/ACT AEPB Inhale 1 puff into the lungs daily. 1 each 11   furosemide  (LASIX ) 40 MG tablet TAKE 1/2 TO 1 (ONE-HALF TO ONE) TABLET BY MOUTH ONCE DAILY ALTERNATING  BETWEEN  1/2  ONE  DAY  AND  1  TABLET  THE  NEXT 45 tablet 1   hydrALAZINE  (APRESOLINE ) 100 MG tablet TAKE 1 TABLET BY MOUTH THREE TIMES DAILY 270 tablet 2   metoprolol  succinate (TOPROL -XL) 100 MG 24 hr tablet TAKE 1.5 TABLETS BY MOUTH ONCE DAILY 135 tablet 2   nitroGLYCERIN  (NITROSTAT ) 0.4 MG SL tablet place 1 tablet under the tongue if needed every 5 minutes for chest pain for 3 doses IF NO RELIEF AFTER 3RD DOSE CALL PRESCRIBER OR 911. 10 tablet 2   pantoprazole  (PROTONIX ) 40 MG tablet Take 1 tablet (40 mg total) by mouth daily. 90 tablet 3   sodium  bicarbonate 650 MG tablet Take 650 mg by mouth 3 (three) times daily.     tamsulosin  (FLOMAX ) 0.4 MG CAPS capsule Take 1 capsule (0.4 mg total) by mouth daily after supper. 90 capsule 3   traZODone  (DESYREL ) 50 MG tablet Take 0.5-1 tablets (25-50 mg total) by mouth at bedtime as needed for sleep. 30 tablet 3   warfarin (COUMADIN ) 5 MG tablet TAKE 1 TO 1 & 1/2 (ONE & ONE-HALF) TABLETS BY MOUTH ONCE DAILY AS DIRECTED BY THE COUMADIN  CLINIC 100 tablet 1   No current facility-administered medications on file prior to visit.    No Known Allergies  Social History   Substance and Sexual Activity  Alcohol  Use No   Alcohol /week: 0.0 standard drinks of alcohol     Social History   Tobacco Use  Smoking Status Every  Day   Current packs/day: 0.50   Average packs/day: 0.5 packs/day for 41.4 years (20.7 ttl pk-yrs)   Types: Cigarettes   Start date: 01/04/1982  Smokeless Tobacco Never  Tobacco Comments   1/2 pack daily    Review of Systems  Constitutional:  Positive for malaise/fatigue.  HENT:  Positive for sinus pain.   Eyes:  Positive for pain.  Respiratory:  Positive for shortness of breath.   Cardiovascular: Negative.   Gastrointestinal:  Positive for heartburn.  Genitourinary: Negative.   Musculoskeletal:  Positive for back pain.  Neurological: Negative.   Endo/Heme/Allergies:  Bruises/bleeds easily.  Psychiatric/Behavioral: Negative.      Objective   Vitals:   05/17/23 1429  BP: (!) 170/67  Pulse: 64  Resp: 16  Temp: 97.8 F (36.6 C)  SpO2: 96%    Physical Exam Vitals reviewed.  Constitutional:      Appearance: Normal appearance. He is normal weight. He is not ill-appearing.  HENT:     Head: Normocephalic and atraumatic.  Cardiovascular:     Rate and Rhythm: Normal rate and regular rhythm.     Heart sounds: Normal heart sounds. No murmur heard.    No friction rub. No gallop.  Pulmonary:     Effort: Pulmonary effort is normal. No respiratory distress.     Breath  sounds: Normal breath sounds. No stridor. No wheezing, rhonchi or rales.  Skin:    General: Skin is warm and dry.     Comments: 3 cm subcutaneous mass in the right lower back with a punctum present.  It is rubbery to touch.  Mobile. Pea-sized subcutaneous rubbery nodule in posterior aspect of left axilla close to the trapezius muscle. Multiple sebaceous cysts with punctum's present on abdominal wall and back.  Neurological:     Mental Status: He is alert and oriented to person, place, and time.     Assessment  Subcutaneous mass, back, most likely in large sebaceous cyst. Left axillary subcutaneous nodule appears benign and possibly another sebaceous cyst. Multiple comorbidities including COPD, CHF, renal insufficiency, and chronic anticoagulation use with Coumadin  Plan  As he is on Coumadin  at the present time, I could not lance the sebaceous cyst.  This may need to be done in the operating room, although he would have to stop his Coumadin  and would be at increased risk for wound infection.  He is scheduled undergo an CT scan to further evaluate the nodules apparently.  This will be done on 05/19/2023.  I told the family that we may need to follow this expectantly.  Should he started having drainage, he was instructed return to my office.  No need for antibiotics at the present time.

## 2023-05-19 ENCOUNTER — Ambulatory Visit (HOSPITAL_COMMUNITY)
Admission: RE | Admit: 2023-05-19 | Discharge: 2023-05-19 | Disposition: A | Discharge status: Home / Self Care | Payer: 59 | Source: Ambulatory Visit | Attending: Acute Care | Admitting: Acute Care

## 2023-05-19 DIAGNOSIS — Z122 Encounter for screening for malignant neoplasm of respiratory organs: Secondary | ICD-10-CM | POA: Diagnosis not present

## 2023-05-19 DIAGNOSIS — Z87891 Personal history of nicotine dependence: Secondary | ICD-10-CM | POA: Diagnosis not present

## 2023-05-19 DIAGNOSIS — F17210 Nicotine dependence, cigarettes, uncomplicated: Secondary | ICD-10-CM | POA: Insufficient documentation

## 2023-05-27 DIAGNOSIS — E8722 Chronic metabolic acidosis: Secondary | ICD-10-CM | POA: Diagnosis not present

## 2023-05-27 DIAGNOSIS — D631 Anemia in chronic kidney disease: Secondary | ICD-10-CM | POA: Diagnosis not present

## 2023-05-27 DIAGNOSIS — I480 Paroxysmal atrial fibrillation: Secondary | ICD-10-CM | POA: Diagnosis not present

## 2023-05-27 DIAGNOSIS — N185 Chronic kidney disease, stage 5: Secondary | ICD-10-CM | POA: Diagnosis not present

## 2023-05-30 ENCOUNTER — Other Ambulatory Visit: Payer: Self-pay

## 2023-05-30 DIAGNOSIS — F17210 Nicotine dependence, cigarettes, uncomplicated: Secondary | ICD-10-CM

## 2023-05-30 DIAGNOSIS — Z87891 Personal history of nicotine dependence: Secondary | ICD-10-CM

## 2023-05-30 DIAGNOSIS — Z122 Encounter for screening for malignant neoplasm of respiratory organs: Secondary | ICD-10-CM

## 2023-05-31 ENCOUNTER — Inpatient Hospital Stay: Payer: 59

## 2023-05-31 ENCOUNTER — Encounter: Payer: Self-pay | Admitting: Oncology

## 2023-05-31 ENCOUNTER — Inpatient Hospital Stay (HOSPITAL_BASED_OUTPATIENT_CLINIC_OR_DEPARTMENT_OTHER): Payer: 59 | Admitting: Oncology

## 2023-05-31 ENCOUNTER — Telehealth: Payer: Self-pay

## 2023-05-31 ENCOUNTER — Inpatient Hospital Stay: Payer: 59 | Admitting: Oncology

## 2023-05-31 VITALS — BP 154/69 | HR 77 | Temp 98.0°F | Resp 18 | Ht 72.0 in | Wt 173.7 lb

## 2023-05-31 DIAGNOSIS — J449 Chronic obstructive pulmonary disease, unspecified: Secondary | ICD-10-CM | POA: Diagnosis not present

## 2023-05-31 DIAGNOSIS — Z992 Dependence on renal dialysis: Secondary | ICD-10-CM | POA: Insufficient documentation

## 2023-05-31 DIAGNOSIS — N184 Chronic kidney disease, stage 4 (severe): Secondary | ICD-10-CM | POA: Diagnosis not present

## 2023-05-31 DIAGNOSIS — I132 Hypertensive heart and chronic kidney disease with heart failure and with stage 5 chronic kidney disease, or end stage renal disease: Secondary | ICD-10-CM | POA: Diagnosis not present

## 2023-05-31 DIAGNOSIS — D696 Thrombocytopenia, unspecified: Secondary | ICD-10-CM | POA: Diagnosis not present

## 2023-05-31 DIAGNOSIS — Z720 Tobacco use: Secondary | ICD-10-CM

## 2023-05-31 DIAGNOSIS — N186 End stage renal disease: Secondary | ICD-10-CM | POA: Insufficient documentation

## 2023-05-31 DIAGNOSIS — J9601 Acute respiratory failure with hypoxia: Secondary | ICD-10-CM | POA: Diagnosis not present

## 2023-05-31 DIAGNOSIS — E870 Hyperosmolality and hypernatremia: Secondary | ICD-10-CM | POA: Diagnosis not present

## 2023-05-31 DIAGNOSIS — Z800 Family history of malignant neoplasm of digestive organs: Secondary | ICD-10-CM

## 2023-05-31 DIAGNOSIS — Z79899 Other long term (current) drug therapy: Secondary | ICD-10-CM | POA: Insufficient documentation

## 2023-05-31 DIAGNOSIS — R6520 Severe sepsis without septic shock: Secondary | ICD-10-CM | POA: Diagnosis not present

## 2023-05-31 DIAGNOSIS — D631 Anemia in chronic kidney disease: Secondary | ICD-10-CM

## 2023-05-31 DIAGNOSIS — I5043 Acute on chronic combined systolic (congestive) and diastolic (congestive) heart failure: Secondary | ICD-10-CM | POA: Diagnosis not present

## 2023-05-31 DIAGNOSIS — R6521 Severe sepsis with septic shock: Secondary | ICD-10-CM | POA: Diagnosis not present

## 2023-05-31 DIAGNOSIS — Z66 Do not resuscitate: Secondary | ICD-10-CM | POA: Diagnosis not present

## 2023-05-31 DIAGNOSIS — E440 Moderate protein-calorie malnutrition: Secondary | ICD-10-CM | POA: Diagnosis not present

## 2023-05-31 DIAGNOSIS — Z515 Encounter for palliative care: Secondary | ICD-10-CM | POA: Diagnosis not present

## 2023-05-31 DIAGNOSIS — I517 Cardiomegaly: Secondary | ICD-10-CM | POA: Diagnosis not present

## 2023-05-31 DIAGNOSIS — I129 Hypertensive chronic kidney disease with stage 1 through stage 4 chronic kidney disease, or unspecified chronic kidney disease: Secondary | ICD-10-CM | POA: Diagnosis not present

## 2023-05-31 DIAGNOSIS — J439 Emphysema, unspecified: Secondary | ICD-10-CM | POA: Diagnosis not present

## 2023-05-31 DIAGNOSIS — J440 Chronic obstructive pulmonary disease with acute lower respiratory infection: Secondary | ICD-10-CM | POA: Diagnosis not present

## 2023-05-31 DIAGNOSIS — I272 Pulmonary hypertension, unspecified: Secondary | ICD-10-CM | POA: Diagnosis not present

## 2023-05-31 DIAGNOSIS — A419 Sepsis, unspecified organism: Secondary | ICD-10-CM | POA: Diagnosis not present

## 2023-05-31 DIAGNOSIS — F17210 Nicotine dependence, cigarettes, uncomplicated: Secondary | ICD-10-CM

## 2023-05-31 DIAGNOSIS — J811 Chronic pulmonary edema: Secondary | ICD-10-CM | POA: Diagnosis not present

## 2023-05-31 DIAGNOSIS — I739 Peripheral vascular disease, unspecified: Secondary | ICD-10-CM | POA: Diagnosis not present

## 2023-05-31 DIAGNOSIS — I083 Combined rheumatic disorders of mitral, aortic and tricuspid valves: Secondary | ICD-10-CM | POA: Diagnosis not present

## 2023-05-31 DIAGNOSIS — R918 Other nonspecific abnormal finding of lung field: Secondary | ICD-10-CM | POA: Diagnosis not present

## 2023-05-31 DIAGNOSIS — N179 Acute kidney failure, unspecified: Secondary | ICD-10-CM | POA: Diagnosis not present

## 2023-05-31 DIAGNOSIS — I4821 Permanent atrial fibrillation: Secondary | ICD-10-CM | POA: Diagnosis not present

## 2023-05-31 DIAGNOSIS — E874 Mixed disorder of acid-base balance: Secondary | ICD-10-CM | POA: Diagnosis not present

## 2023-05-31 DIAGNOSIS — G9341 Metabolic encephalopathy: Secondary | ICD-10-CM | POA: Diagnosis not present

## 2023-05-31 DIAGNOSIS — J441 Chronic obstructive pulmonary disease with (acute) exacerbation: Secondary | ICD-10-CM | POA: Diagnosis not present

## 2023-05-31 LAB — CBC WITH DIFFERENTIAL/PLATELET
Abs Immature Granulocytes: 0.05 10*3/uL (ref 0.00–0.07)
Basophils Absolute: 0 10*3/uL (ref 0.0–0.1)
Basophils Relative: 1 %
Eosinophils Absolute: 0.2 10*3/uL (ref 0.0–0.5)
Eosinophils Relative: 3 %
HCT: 31.4 % — ABNORMAL LOW (ref 39.0–52.0)
Hemoglobin: 10 g/dL — ABNORMAL LOW (ref 13.0–17.0)
Immature Granulocytes: 1 %
Lymphocytes Relative: 19 %
Lymphs Abs: 1.1 10*3/uL (ref 0.7–4.0)
MCH: 30.4 pg (ref 26.0–34.0)
MCHC: 31.8 g/dL (ref 30.0–36.0)
MCV: 95.4 fL (ref 80.0–100.0)
Monocytes Absolute: 0.6 10*3/uL (ref 0.1–1.0)
Monocytes Relative: 9 %
Neutro Abs: 4.1 10*3/uL (ref 1.7–7.7)
Neutrophils Relative %: 67 %
Platelets: 170 10*3/uL (ref 150–400)
RBC: 3.29 MIL/uL — ABNORMAL LOW (ref 4.22–5.81)
RDW: 14.8 % (ref 11.5–15.5)
WBC: 6 10*3/uL (ref 4.0–10.5)
nRBC: 0 % (ref 0.0–0.2)

## 2023-05-31 LAB — COMPREHENSIVE METABOLIC PANEL
ALT: 13 U/L (ref 0–44)
AST: 14 U/L — ABNORMAL LOW (ref 15–41)
Albumin: 3.5 g/dL (ref 3.5–5.0)
Alkaline Phosphatase: 59 U/L (ref 38–126)
Anion gap: 14 (ref 5–15)
BUN: 47 mg/dL — ABNORMAL HIGH (ref 8–23)
CO2: 20 mmol/L — ABNORMAL LOW (ref 22–32)
Calcium: 9.9 mg/dL (ref 8.9–10.3)
Chloride: 105 mmol/L (ref 98–111)
Creatinine, Ser: 4.4 mg/dL — ABNORMAL HIGH (ref 0.61–1.24)
GFR, Estimated: 14 mL/min — ABNORMAL LOW (ref >60)
Glucose, Bld: 97 mg/dL (ref 70–99)
Potassium: 4.4 mmol/L (ref 3.5–5.1)
Sodium: 139 mmol/L (ref 135–145)
Total Bilirubin: 0.6 mg/dL (ref 0.0–1.2)
Total Protein: 7.3 g/dL (ref 6.5–8.1)

## 2023-05-31 LAB — IRON AND TIBC
Iron: 33 ug/dL — ABNORMAL LOW (ref 45–182)
Saturation Ratios: 12 % — ABNORMAL LOW (ref 17.9–39.5)
TIBC: 277 ug/dL (ref 250–450)
UIBC: 244 ug/dL

## 2023-05-31 LAB — VITAMIN B12: Vitamin B-12: 368 pg/mL (ref 180–914)

## 2023-05-31 LAB — FOLATE: Folate: 8 ng/mL (ref >5.9)

## 2023-05-31 LAB — FERRITIN: Ferritin: 122 ng/mL (ref 24–336)

## 2023-05-31 NOTE — Addendum Note (Signed)
Addended byCindie Crumbly on: 05/31/2023 12:22 PM   Modules accepted: Orders

## 2023-05-31 NOTE — Assessment & Plan Note (Signed)
Patient has ESRD.  Planning to start dialysis soon. -Continue to follow-up with Dr. Wolfgang Phoenix

## 2023-05-31 NOTE — Assessment & Plan Note (Signed)
Half pack per day smoker, considering cutting down. -Encourage smoking cessation.

## 2023-05-31 NOTE — Patient Instructions (Signed)
VISIT SUMMARY:  Today, we discussed your anemia, colon cancer screening, and tobacco use. We also reviewed your upcoming dialysis and general health maintenance.  YOUR PLAN:  -ANEMIA SECONDARY TO CHRONIC KIDNEY DISEASE: Anemia is a condition where you don't have enough red blood cells to carry oxygen throughout your body. This can be caused by chronic kidney disease. We will check your iron levels and hemoglobin today. If you are low on iron, we will start you on iron supplements. If your hemoglobin is below 10 and you are not iron deficient, we may start EPO shots. We will recheck your labs in 6 weeks.  -COLON CANCER SCREENING: Given your family history of colon cancer and the fact that your last colonoscopy was in 2017, you are overdue for a screening. We will refer you to Dr. Benard Rink for a colonoscopy and send a new referral if needed.  -TOBACCO USE: Smoking can have many negative effects on your health. We encourage you to quit smoking and will support you in cutting down.   INSTRUCTIONS:  Please complete the blood work today to check your iron levels and hemoglobin. We will refer you to Dr. Benard Rink for a colonoscopy. Plan to return for a follow-up visit in 6 weeks, or sooner based on your lab results.

## 2023-05-31 NOTE — Telephone Encounter (Signed)
Pt informed

## 2023-05-31 NOTE — Assessment & Plan Note (Signed)
Anemia likely secondary to chronic kidney disease.  Patient has ESRD and is planned to start dialysis soon.  Recent hemoglobin of 9.1.  Iron labs unavailable at this time. -Order blood work today to assess iron levels and hemoglobin to rule out iron deficiency. -If iron deficient, start oral iron supplementation and IV iron if needed. -If hemoglobin is less than 10 and not iron deficient, consider starting EPO shots. -Will refer to GI for colonoscopy and endoscopy. -Plan to recheck labs in 6 weeks.

## 2023-05-31 NOTE — Progress Notes (Signed)
Called patient and talked to patient's wife about results and scheduling iron iv infusions. Patient's wife understood and agreeable with plan.

## 2023-05-31 NOTE — Assessment & Plan Note (Signed)
Patient has a family history of colon cancer in father who died from it at age 71, along with multiple colon polyps found on colonoscopy in 2017.  Patient is overdue for repeat colonoscopy at this time. -Refer to Dr. Benard Rink for colonoscopy.

## 2023-05-31 NOTE — Progress Notes (Signed)
[facility] at [facility] HEMATOLOGY NEW VISIT  Anabel Halon, MD  REASON FOR REFERRAL: Anemia of chronic kidney disease  HISTORY OF PRESENT ILLNESS:  Discussed the use of AI scribe software for clinical note transcription with the patient, who gave verbal consent to proceed.   [name] [name] 73 y.o. male referred for anemia of chronic kidney disease.  He is accompanied by his wife today.  Patient has ESRD and is planned to start dialysis soon.  He reports feeling well and has no complaints today.  He denies fatigue, dyspnea on exertion, bright red blood in stools, melena, weight loss.  He had a colonoscopy in 2017 and was told to repeat in a few years which patient did not.  He reports no change in appetite recently.  Overall he is doing well and is at baseline.  The patient is a smoker and is currently smoking half a pack per day and is trying to cut down.  He does not drink alcohol.  He reported history of colon cancer in his father at an age of 55 years who unfortunately passed from it.  He lives with his wife in [place].   I have reviewed the past medical history, past surgical history, social history and family history with the patient   ALLERGIES:  has no known allergies.  MEDICATIONS:  Current Outpatient Medications  Medication Sig Dispense Refill   albuterol (PROVENTIL) (2.5 MG/3ML) 0.083% nebulizer solution USE 1 VIAL IN NEBULIZER EVERY 4 HOURS AS NEEDED FOR WHEEZING OR SHORTNESS OF BREATH 180 mL 0   albuterol (VENTOLIN HFA) 108 (90 Base) MCG/ACT inhaler INHALE 1 TO 2 PUFFS BY MOUTH EVERY 6 HOURS AS NEEDED FOR WHEEZING OR SHORTNESS OF BREATH 9 g 0   amLODipine (NORVASC) 10 MG tablet Take 1 tablet (10 mg total) by mouth daily. 90 tablet 3   atorvastatin (LIPITOR) 20 MG tablet Take 1 tablet by mouth once daily 90 tablet 0   calcitRIOL (ROCALTROL) 0.25 MCG capsule Take 1 capsule (0.25 mcg total) by mouth daily. 30 capsule 0   fluticasone (FLONASE) 50  MCG/ACT nasal spray Place 1 spray into both nostrils daily as needed for allergies or rhinitis.     Fluticasone-Umeclidin-Vilant (TRELEGY ELLIPTA) 100-62.5-25 MCG/ACT AEPB Inhale 1 puff into the lungs daily. 1 each 11   furosemide (LASIX) 40 MG tablet TAKE 1/2 TO 1 (ONE-HALF TO ONE) TABLET BY MOUTH ONCE DAILY ALTERNATING  BETWEEN  1/2  ONE  DAY  AND  1  TABLET  THE  NEXT 45 tablet 1   hydrALAZINE (APRESOLINE) 100 MG tablet TAKE 1 TABLET BY MOUTH THREE TIMES DAILY 270 tablet 2   metoprolol succinate (TOPROL-XL) 100 MG 24 hr tablet TAKE 1.5 TABLETS BY MOUTH ONCE DAILY 135 tablet 2   NIFEdipine (ADALAT CC) 30 MG 24 hr tablet Take by mouth.     nitroGLYCERIN (NITROSTAT) 0.4 MG SL tablet place 1 tablet under the tongue if needed every 5 minutes for chest pain for 3 doses IF NO RELIEF AFTER 3RD DOSE CALL PRESCRIBER OR 911. 10 tablet 2   pantoprazole (PROTONIX) 40 MG tablet Take 1 tablet (40 mg total) by mouth daily. 90 tablet 3   sodium bicarbonate 650 MG tablet Take 650 mg by mouth 3 (three) times daily.     tamsulosin (FLOMAX) 0.4 MG CAPS capsule Take 1 capsule (0.4 mg total) by mouth daily after supper. 90 capsule 3   traZODone (DESYREL) 50 MG tablet Take 0.5-1 tablets (  25-50 mg total) by mouth at bedtime as needed for sleep. 30 tablet 3   warfarin (COUMADIN) 5 MG tablet TAKE 1 TO 1 & 1/2 (ONE & ONE-HALF) TABLETS BY MOUTH ONCE DAILY AS DIRECTED BY THE COUMADIN CLINIC 100 tablet 1   No current facility-administered medications for this visit.     REVIEW OF SYSTEMS:   Constitutional: Denies fevers, chills or night sweats Eyes: Denies blurriness of vision Ears, nose, mouth, throat, and face: Denies mucositis or sore throat Respiratory: Denies cough, dyspnea or wheezes Cardiovascular: Denies palpitation, chest discomfort or lower extremity swelling Gastrointestinal:  Denies nausea, heartburn or change in bowel habits Skin: Denies abnormal skin rashes Lymphatics: Denies new lymphadenopathy or easy  bruising Neurological:Denies numbness, tingling or new weaknesses Behavioral/Psych: Mood is stable, no new changes  All other systems were reviewed with the patient and are negative.  PHYSICAL EXAMINATION:   Vitals:   05/31/23 0921  BP: (!) 154/69  Pulse: 77  Resp: 18  Temp: 98 F (36.7 C)  SpO2: 98%    GENERAL:alert, no distress and comfortable LUNGS: clear to auscultation and percussion with normal breathing effort HEART: regular rate & rhythm and no murmurs and no lower extremity edema ABDOMEN:abdomen soft, non-tender and normal bowel sounds Musculoskeletal:no cyanosis of digits and no clubbing  NEURO: alert & oriented x 3 with fluent speech  LABORATORY DATA:  I have reviewed the data as listed and labs from nephrologist Dr. Wolfgang Phoenix drawn on 03/25/2023 CMP: Potassium: 4.1, creatinine: 4.35, GFR: 13 CBC: WBC: 5.4, hemoglobin: 9.9, hematocrit: 31.0, MCV: 96, platelets: 146   Lab Results  Component Value Date   WBC 5.6 04/05/2023   NEUTROABS 3.6 04/05/2023   HGB 9.1 (L) 04/05/2023   HCT 28.7 (L) 04/05/2023   MCV 94 04/05/2023   PLT 144 (L) 04/05/2023      Component Value Date/Time   NA 143 04/05/2023 0804   K 3.6 04/05/2023 0804   CL 108 (H) 04/05/2023 0804   CO2 18 (L) 04/05/2023 0804   GLUCOSE 104 (H) 04/05/2023 0804   GLUCOSE 103 (H) 03/25/2023 1030   BUN 42 (H) 04/05/2023 0804   CREATININE 4.45 (H) 04/05/2023 0804   CREATININE 2.99 (H) 01/22/2020 1501   CALCIUM 9.3 04/05/2023 0804   PROT 5.9 (L) 04/05/2023 0804   ALBUMIN 3.6 (L) 04/05/2023 0804   AST 13 04/05/2023 0804   ALT 11 04/05/2023 0804   ALKPHOS 102 04/05/2023 0804   BILITOT 0.5 04/05/2023 0804   GFRNONAA 14 (L) 03/25/2023 1030   GFRNONAA 53 (L) 05/14/2013 0905   GFRAA 24 (L) 10/16/2019 1634   GFRAA 62 05/14/2013 0905        Chemistry      Component Value Date/Time   NA 143 04/05/2023 0804   K 3.6 04/05/2023 0804   CL 108 (H) 04/05/2023 0804   CO2 18 (L) 04/05/2023 0804   BUN 42 (H)  04/05/2023 0804   CREATININE 4.45 (H) 04/05/2023 0804   CREATININE 2.99 (H) 01/22/2020 1501      Component Value Date/Time   CALCIUM 9.3 04/05/2023 0804   ALKPHOS 102 04/05/2023 0804   AST 13 04/05/2023 0804   ALT 11 04/05/2023 0804   BILITOT 0.5 04/05/2023 0804      ASSESSMENT & PLAN:  Patient is a 73 year old male with ESRD referred for anemia   Anemia in chronic kidney disease Anemia likely secondary to chronic kidney disease.  Patient has ESRD and is planned to start dialysis soon.  Recent hemoglobin  of 9.1.  Iron labs unavailable at this time. -Order blood work today to assess iron levels and hemoglobin to rule out iron deficiency. -If iron deficient, start oral iron supplementation and IV iron if needed. -If hemoglobin is less than 10 and not iron deficient, consider starting EPO shots. -Will refer to GI for colonoscopy and endoscopy. -Plan to recheck labs in 6 weeks.  ESRD (end stage renal disease) (HCC) Patient has ESRD.  Planning to start dialysis soon. -Continue to follow-up with Dr. Wolfgang Phoenix  Family history of colon cancer in father Patient has a family history of colon cancer in father who died from it at age 82, along with multiple colon polyps found on colonoscopy in 2017.  Patient is overdue for repeat colonoscopy at this time. -Refer to Dr. Benard Rink for colonoscopy.  Tobacco use Half pack per day smoker, considering cutting down. -Encourage smoking cessation.   Orders Placed This Encounter  Procedures   CBC with Differential/Platelet    Standing Status:   Future    Number of Occurrences:   1    Expected Date:   05/31/2023    Expiration Date:   05/30/2024   Comprehensive metabolic panel    Standing Status:   Future    Number of Occurrences:   1    Expected Date:   05/31/2023    Expiration Date:   05/30/2024   Ferritin    Standing Status:   Future    Number of Occurrences:   1    Expected Date:   05/31/2023    Expiration Date:   05/30/2024   Folate     Standing Status:   Future    Number of Occurrences:   1    Expected Date:   05/31/2023    Expiration Date:   05/30/2024   Vitamin B12    Standing Status:   Future    Number of Occurrences:   1    Expected Date:   05/31/2023    Expiration Date:   05/30/2024   Iron and TIBC    Standing Status:   Future    Number of Occurrences:   1    Expected Date:   05/31/2023    Expiration Date:   05/30/2024    Release to patient:   Immediate [1]    The total time spent in the appointment was 45 minutes encounter with patients including review of chart and various tests results, discussions about plan of care and coordination of care plan   All questions were answered. The patient knows to call the clinic with any problems, questions or concerns. No barriers to learning was detected.   Cindie Crumbly, MD 1/28/202510:07 AM

## 2023-06-01 ENCOUNTER — Other Ambulatory Visit: Payer: Self-pay

## 2023-06-01 ENCOUNTER — Encounter (HOSPITAL_COMMUNITY): Payer: Self-pay | Admitting: Emergency Medicine

## 2023-06-01 ENCOUNTER — Emergency Department (HOSPITAL_COMMUNITY): Payer: 59

## 2023-06-01 ENCOUNTER — Inpatient Hospital Stay (HOSPITAL_COMMUNITY)
Admission: EM | Admit: 2023-06-01 | Discharge: 2023-07-02 | DRG: 871 | Disposition: E | Payer: 59 | Attending: Critical Care Medicine | Admitting: Critical Care Medicine

## 2023-06-01 DIAGNOSIS — R739 Hyperglycemia, unspecified: Secondary | ICD-10-CM | POA: Diagnosis not present

## 2023-06-01 DIAGNOSIS — I5021 Acute systolic (congestive) heart failure: Secondary | ICD-10-CM | POA: Diagnosis not present

## 2023-06-01 DIAGNOSIS — E875 Hyperkalemia: Secondary | ICD-10-CM | POA: Diagnosis not present

## 2023-06-01 DIAGNOSIS — Z7901 Long term (current) use of anticoagulants: Secondary | ICD-10-CM

## 2023-06-01 DIAGNOSIS — Z4682 Encounter for fitting and adjustment of non-vascular catheter: Secondary | ICD-10-CM | POA: Diagnosis not present

## 2023-06-01 DIAGNOSIS — E874 Mixed disorder of acid-base balance: Secondary | ICD-10-CM | POA: Diagnosis not present

## 2023-06-01 DIAGNOSIS — J9811 Atelectasis: Secondary | ICD-10-CM | POA: Diagnosis not present

## 2023-06-01 DIAGNOSIS — I2723 Pulmonary hypertension due to lung diseases and hypoxia: Secondary | ICD-10-CM | POA: Diagnosis not present

## 2023-06-01 DIAGNOSIS — N184 Chronic kidney disease, stage 4 (severe): Secondary | ICD-10-CM | POA: Diagnosis not present

## 2023-06-01 DIAGNOSIS — J189 Pneumonia, unspecified organism: Secondary | ICD-10-CM | POA: Diagnosis present

## 2023-06-01 DIAGNOSIS — D649 Anemia, unspecified: Secondary | ICD-10-CM | POA: Diagnosis not present

## 2023-06-01 DIAGNOSIS — R6521 Severe sepsis with septic shock: Secondary | ICD-10-CM | POA: Diagnosis present

## 2023-06-01 DIAGNOSIS — Z8679 Personal history of other diseases of the circulatory system: Secondary | ICD-10-CM

## 2023-06-01 DIAGNOSIS — R0602 Shortness of breath: Principal | ICD-10-CM

## 2023-06-01 DIAGNOSIS — I739 Peripheral vascular disease, unspecified: Secondary | ICD-10-CM | POA: Diagnosis present

## 2023-06-01 DIAGNOSIS — R6520 Severe sepsis without septic shock: Secondary | ICD-10-CM | POA: Diagnosis not present

## 2023-06-01 DIAGNOSIS — J440 Chronic obstructive pulmonary disease with acute lower respiratory infection: Secondary | ICD-10-CM | POA: Diagnosis present

## 2023-06-01 DIAGNOSIS — F17210 Nicotine dependence, cigarettes, uncomplicated: Secondary | ICD-10-CM | POA: Diagnosis present

## 2023-06-01 DIAGNOSIS — E870 Hyperosmolality and hypernatremia: Secondary | ICD-10-CM | POA: Diagnosis present

## 2023-06-01 DIAGNOSIS — Z8249 Family history of ischemic heart disease and other diseases of the circulatory system: Secondary | ICD-10-CM

## 2023-06-01 DIAGNOSIS — E785 Hyperlipidemia, unspecified: Secondary | ICD-10-CM | POA: Diagnosis present

## 2023-06-01 DIAGNOSIS — Z87442 Personal history of urinary calculi: Secondary | ICD-10-CM

## 2023-06-01 DIAGNOSIS — Z8042 Family history of malignant neoplasm of prostate: Secondary | ICD-10-CM

## 2023-06-01 DIAGNOSIS — F419 Anxiety disorder, unspecified: Secondary | ICD-10-CM | POA: Diagnosis present

## 2023-06-01 DIAGNOSIS — J449 Chronic obstructive pulmonary disease, unspecified: Secondary | ICD-10-CM

## 2023-06-01 DIAGNOSIS — Z992 Dependence on renal dialysis: Secondary | ICD-10-CM | POA: Diagnosis not present

## 2023-06-01 DIAGNOSIS — I5043 Acute on chronic combined systolic (congestive) and diastolic (congestive) heart failure: Secondary | ICD-10-CM | POA: Diagnosis not present

## 2023-06-01 DIAGNOSIS — I132 Hypertensive heart and chronic kidney disease with heart failure and with stage 5 chronic kidney disease, or end stage renal disease: Secondary | ICD-10-CM | POA: Diagnosis not present

## 2023-06-01 DIAGNOSIS — Z8673 Personal history of transient ischemic attack (TIA), and cerebral infarction without residual deficits: Secondary | ICD-10-CM

## 2023-06-01 DIAGNOSIS — N2889 Other specified disorders of kidney and ureter: Secondary | ICD-10-CM | POA: Diagnosis not present

## 2023-06-01 DIAGNOSIS — Z66 Do not resuscitate: Secondary | ICD-10-CM | POA: Diagnosis not present

## 2023-06-01 DIAGNOSIS — J9601 Acute respiratory failure with hypoxia: Secondary | ICD-10-CM | POA: Diagnosis not present

## 2023-06-01 DIAGNOSIS — E669 Obesity, unspecified: Secondary | ICD-10-CM | POA: Diagnosis present

## 2023-06-01 DIAGNOSIS — J9690 Respiratory failure, unspecified, unspecified whether with hypoxia or hypercapnia: Secondary | ICD-10-CM | POA: Diagnosis not present

## 2023-06-01 DIAGNOSIS — I083 Combined rheumatic disorders of mitral, aortic and tricuspid valves: Secondary | ICD-10-CM | POA: Diagnosis present

## 2023-06-01 DIAGNOSIS — N179 Acute kidney failure, unspecified: Secondary | ICD-10-CM | POA: Diagnosis present

## 2023-06-01 DIAGNOSIS — I4821 Permanent atrial fibrillation: Secondary | ICD-10-CM | POA: Diagnosis present

## 2023-06-01 DIAGNOSIS — I5023 Acute on chronic systolic (congestive) heart failure: Secondary | ICD-10-CM | POA: Diagnosis not present

## 2023-06-01 DIAGNOSIS — J811 Chronic pulmonary edema: Secondary | ICD-10-CM | POA: Diagnosis not present

## 2023-06-01 DIAGNOSIS — R0902 Hypoxemia: Secondary | ICD-10-CM | POA: Diagnosis not present

## 2023-06-01 DIAGNOSIS — D509 Iron deficiency anemia, unspecified: Secondary | ICD-10-CM | POA: Diagnosis present

## 2023-06-01 DIAGNOSIS — I480 Paroxysmal atrial fibrillation: Secondary | ICD-10-CM | POA: Diagnosis present

## 2023-06-01 DIAGNOSIS — N261 Atrophy of kidney (terminal): Secondary | ICD-10-CM | POA: Diagnosis not present

## 2023-06-01 DIAGNOSIS — D696 Thrombocytopenia, unspecified: Secondary | ICD-10-CM | POA: Diagnosis present

## 2023-06-01 DIAGNOSIS — Z8547 Personal history of malignant neoplasm of testis: Secondary | ICD-10-CM

## 2023-06-01 DIAGNOSIS — R339 Retention of urine, unspecified: Secondary | ICD-10-CM | POA: Diagnosis present

## 2023-06-01 DIAGNOSIS — K3189 Other diseases of stomach and duodenum: Secondary | ICD-10-CM | POA: Diagnosis not present

## 2023-06-01 DIAGNOSIS — I2510 Atherosclerotic heart disease of native coronary artery without angina pectoris: Secondary | ICD-10-CM | POA: Diagnosis present

## 2023-06-01 DIAGNOSIS — Z452 Encounter for adjustment and management of vascular access device: Secondary | ICD-10-CM | POA: Diagnosis not present

## 2023-06-01 DIAGNOSIS — D631 Anemia in chronic kidney disease: Secondary | ICD-10-CM | POA: Diagnosis present

## 2023-06-01 DIAGNOSIS — N185 Chronic kidney disease, stage 5: Secondary | ICD-10-CM

## 2023-06-01 DIAGNOSIS — I252 Old myocardial infarction: Secondary | ICD-10-CM

## 2023-06-01 DIAGNOSIS — J439 Emphysema, unspecified: Secondary | ICD-10-CM | POA: Diagnosis present

## 2023-06-01 DIAGNOSIS — R918 Other nonspecific abnormal finding of lung field: Secondary | ICD-10-CM | POA: Diagnosis not present

## 2023-06-01 DIAGNOSIS — J441 Chronic obstructive pulmonary disease with (acute) exacerbation: Secondary | ICD-10-CM | POA: Diagnosis not present

## 2023-06-01 DIAGNOSIS — G9341 Metabolic encephalopathy: Secondary | ICD-10-CM | POA: Diagnosis not present

## 2023-06-01 DIAGNOSIS — A419 Sepsis, unspecified organism: Principal | ICD-10-CM

## 2023-06-01 DIAGNOSIS — I509 Heart failure, unspecified: Secondary | ICD-10-CM | POA: Diagnosis not present

## 2023-06-01 DIAGNOSIS — Z515 Encounter for palliative care: Secondary | ICD-10-CM

## 2023-06-01 DIAGNOSIS — Z7189 Other specified counseling: Secondary | ICD-10-CM | POA: Diagnosis not present

## 2023-06-01 DIAGNOSIS — R001 Bradycardia, unspecified: Secondary | ICD-10-CM | POA: Diagnosis present

## 2023-06-01 DIAGNOSIS — M7989 Other specified soft tissue disorders: Secondary | ICD-10-CM | POA: Diagnosis not present

## 2023-06-01 DIAGNOSIS — I272 Pulmonary hypertension, unspecified: Secondary | ICD-10-CM | POA: Diagnosis not present

## 2023-06-01 DIAGNOSIS — I129 Hypertensive chronic kidney disease with stage 1 through stage 4 chronic kidney disease, or unspecified chronic kidney disease: Secondary | ICD-10-CM | POA: Diagnosis not present

## 2023-06-01 DIAGNOSIS — I517 Cardiomegaly: Secondary | ICD-10-CM | POA: Diagnosis not present

## 2023-06-01 DIAGNOSIS — J9600 Acute respiratory failure, unspecified whether with hypoxia or hypercapnia: Secondary | ICD-10-CM | POA: Diagnosis not present

## 2023-06-01 DIAGNOSIS — Z860100 Personal history of colon polyps, unspecified: Secondary | ICD-10-CM

## 2023-06-01 DIAGNOSIS — I10 Essential (primary) hypertension: Secondary | ICD-10-CM | POA: Diagnosis present

## 2023-06-01 DIAGNOSIS — E440 Moderate protein-calorie malnutrition: Secondary | ICD-10-CM | POA: Diagnosis not present

## 2023-06-01 DIAGNOSIS — I700 Atherosclerosis of aorta: Secondary | ICD-10-CM | POA: Diagnosis not present

## 2023-06-01 DIAGNOSIS — I4891 Unspecified atrial fibrillation: Secondary | ICD-10-CM | POA: Diagnosis not present

## 2023-06-01 DIAGNOSIS — N186 End stage renal disease: Secondary | ICD-10-CM | POA: Diagnosis not present

## 2023-06-01 DIAGNOSIS — Z955 Presence of coronary angioplasty implant and graft: Secondary | ICD-10-CM | POA: Diagnosis not present

## 2023-06-01 DIAGNOSIS — Z800 Family history of malignant neoplasm of digestive organs: Secondary | ICD-10-CM

## 2023-06-01 DIAGNOSIS — Z79899 Other long term (current) drug therapy: Secondary | ICD-10-CM

## 2023-06-01 DIAGNOSIS — R0689 Other abnormalities of breathing: Secondary | ICD-10-CM | POA: Diagnosis not present

## 2023-06-01 DIAGNOSIS — E860 Dehydration: Secondary | ICD-10-CM | POA: Diagnosis present

## 2023-06-01 DIAGNOSIS — R54 Age-related physical debility: Secondary | ICD-10-CM | POA: Diagnosis present

## 2023-06-01 DIAGNOSIS — I454 Nonspecific intraventricular block: Secondary | ICD-10-CM | POA: Diagnosis not present

## 2023-06-01 DIAGNOSIS — J90 Pleural effusion, not elsewhere classified: Secondary | ICD-10-CM | POA: Diagnosis not present

## 2023-06-01 DIAGNOSIS — R0989 Other specified symptoms and signs involving the circulatory and respiratory systems: Secondary | ICD-10-CM | POA: Diagnosis not present

## 2023-06-01 LAB — CBC
HCT: 28.5 % — ABNORMAL LOW (ref 39.0–52.0)
Hemoglobin: 9.2 g/dL — ABNORMAL LOW (ref 13.0–17.0)
MCH: 31 pg (ref 26.0–34.0)
MCHC: 32.3 g/dL (ref 30.0–36.0)
MCV: 96 fL (ref 80.0–100.0)
Platelets: 161 10*3/uL (ref 150–400)
RBC: 2.97 MIL/uL — ABNORMAL LOW (ref 4.22–5.81)
RDW: 15.4 % (ref 11.5–15.5)
WBC: 9.9 10*3/uL (ref 4.0–10.5)
nRBC: 0 % (ref 0.0–0.2)

## 2023-06-01 LAB — BASIC METABOLIC PANEL
Anion gap: 17 — ABNORMAL HIGH (ref 5–15)
BUN: 45 mg/dL — ABNORMAL HIGH (ref 8–23)
CO2: 16 mmol/L — ABNORMAL LOW (ref 22–32)
Calcium: 9.9 mg/dL (ref 8.9–10.3)
Chloride: 106 mmol/L (ref 98–111)
Creatinine, Ser: 4.36 mg/dL — ABNORMAL HIGH (ref 0.61–1.24)
GFR, Estimated: 14 mL/min — ABNORMAL LOW (ref >60)
Glucose, Bld: 189 mg/dL — ABNORMAL HIGH (ref 70–99)
Potassium: 4.5 mmol/L (ref 3.5–5.1)
Sodium: 139 mmol/L (ref 135–145)

## 2023-06-01 LAB — BLOOD GAS, VENOUS
Acid-base deficit: 6.6 mmol/L — ABNORMAL HIGH (ref 0.0–2.0)
Bicarbonate: 17.1 mmol/L — ABNORMAL LOW (ref 20.0–28.0)
Drawn by: 4237
O2 Saturation: 82.5 %
Patient temperature: 36.7
pCO2, Ven: 27 mm[Hg] — ABNORMAL LOW (ref 44–60)
pH, Ven: 7.41 (ref 7.25–7.43)
pO2, Ven: 45 mm[Hg] (ref 32–45)

## 2023-06-01 LAB — RESP PANEL BY RT-PCR (RSV, FLU A&B, COVID)  RVPGX2
Influenza A by PCR: NEGATIVE
Influenza B by PCR: NEGATIVE
Resp Syncytial Virus by PCR: NEGATIVE
SARS Coronavirus 2 by RT PCR: NEGATIVE

## 2023-06-01 LAB — PROTIME-INR
INR: 3.5 — ABNORMAL HIGH (ref 0.8–1.2)
Prothrombin Time: 35 s — ABNORMAL HIGH (ref 11.4–15.2)

## 2023-06-01 LAB — LACTIC ACID, PLASMA
Lactic Acid, Venous: 4.8 mmol/L (ref 0.5–1.9)
Lactic Acid, Venous: 4.6 mmol/L (ref 0.5–1.9)

## 2023-06-01 MED ORDER — SODIUM CHLORIDE 0.9 % IV SOLN
1.0000 g | Freq: Once | INTRAVENOUS | Status: AC
  Administered 2023-06-01: 1 g via INTRAVENOUS
  Filled 2023-06-01: qty 10

## 2023-06-01 MED ORDER — HYDRALAZINE HCL 25 MG PO TABS
100.0000 mg | ORAL_TABLET | Freq: Three times a day (TID) | ORAL | Status: DC
  Filled 2023-06-01: qty 4

## 2023-06-01 MED ORDER — LACTATED RINGERS IV SOLN: INTRAVENOUS | Status: DC

## 2023-06-01 MED ORDER — SODIUM BICARBONATE 650 MG PO TABS
650.0000 mg | ORAL_TABLET | Freq: Three times a day (TID) | ORAL | Status: DC
  Filled 2023-06-01: qty 1

## 2023-06-01 MED ORDER — ACETAMINOPHEN 650 MG RE SUPP: 650.0000 mg | Freq: Four times a day (QID) | RECTAL | Status: DC | PRN

## 2023-06-01 MED ORDER — METOCLOPRAMIDE HCL 5 MG/ML IJ SOLN: 5.0000 mg | Freq: Four times a day (QID) | INTRAMUSCULAR | Status: DC | PRN

## 2023-06-01 MED ORDER — SODIUM CHLORIDE 0.9 % IV SOLN
500.0000 mg | INTRAVENOUS | Status: DC
  Administered 2023-06-02: 500 mg via INTRAVENOUS
  Filled 2023-06-01 (×2): qty 5

## 2023-06-01 MED ORDER — FUROSEMIDE 40 MG PO TABS: 40.0000 mg | ORAL_TABLET | ORAL | Status: DC

## 2023-06-01 MED ORDER — CALCITRIOL 0.25 MCG PO CAPS
0.2500 ug | ORAL_CAPSULE | Freq: Every day | ORAL | Status: DC
  Filled 2023-06-01: qty 1

## 2023-06-01 MED ORDER — METOPROLOL SUCCINATE ER 50 MG PO TB24: 100.0000 mg | ORAL_TABLET | Freq: Every day | ORAL | Status: DC

## 2023-06-01 MED ORDER — ATORVASTATIN CALCIUM 10 MG PO TABS
20.0000 mg | ORAL_TABLET | Freq: Every day | ORAL | Status: DC
Start: 2023-06-02 — End: 2023-06-02

## 2023-06-01 MED ORDER — TAMSULOSIN HCL 0.4 MG PO CAPS: 0.4000 mg | ORAL_CAPSULE | Freq: Every day | ORAL | Status: DC

## 2023-06-01 MED ORDER — IPRATROPIUM-ALBUTEROL 0.5-2.5 (3) MG/3ML IN SOLN
3.0000 mL | Freq: Once | RESPIRATORY_TRACT | Status: AC
  Administered 2023-06-01: 3 mL via RESPIRATORY_TRACT
  Filled 2023-06-01: qty 3

## 2023-06-01 MED ORDER — MAGNESIUM HYDROXIDE 400 MG/5ML PO SUSP: 30.0000 mL | Freq: Every day | ORAL | Status: DC | PRN

## 2023-06-01 MED ORDER — SODIUM CHLORIDE 0.9 % IV SOLN
2.0000 g | INTRAVENOUS | Status: AC
  Administered 2023-06-02 – 2023-06-06 (×5): 2 g via INTRAVENOUS
  Filled 2023-06-01 (×5): qty 20

## 2023-06-01 MED ORDER — FLUTICASONE PROPIONATE 50 MCG/ACT NA SUSP: 1.0000 | Freq: Every day | NASAL | Status: DC | PRN

## 2023-06-01 MED ORDER — NITROGLYCERIN 0.4 MG SL SUBL: 0.4000 mg | SUBLINGUAL_TABLET | SUBLINGUAL | Status: DC | PRN

## 2023-06-01 MED ORDER — ONDANSETRON HCL 4 MG/2ML IJ SOLN
4.0000 mg | Freq: Once | INTRAMUSCULAR | Status: AC
  Administered 2023-06-01: 4 mg via INTRAVENOUS
  Filled 2023-06-01: qty 2

## 2023-06-01 MED ORDER — LACTATED RINGERS IV BOLUS (SEPSIS)
1000.0000 mL | Freq: Once | INTRAVENOUS | Status: AC
  Administered 2023-06-01: 1000 mL via INTRAVENOUS

## 2023-06-01 MED ORDER — AMLODIPINE BESYLATE 10 MG PO TABS: 10.0000 mg | ORAL_TABLET | Freq: Every day | ORAL | Status: DC

## 2023-06-01 MED ORDER — HYDROCOD POLI-CHLORPHE POLI ER 10-8 MG/5ML PO SUER: 5.0000 mL | Freq: Two times a day (BID) | ORAL | Status: DC | PRN

## 2023-06-01 MED ORDER — NIFEDIPINE ER OSMOTIC RELEASE 30 MG PO TB24
30.0000 mg | ORAL_TABLET | Freq: Every day | ORAL | Status: DC
  Filled 2023-06-01: qty 1

## 2023-06-01 MED ORDER — ALBUTEROL SULFATE HFA 108 (90 BASE) MCG/ACT IN AERS: 2.0000 | INHALATION_SPRAY | RESPIRATORY_TRACT | Status: DC | PRN

## 2023-06-01 MED ORDER — TRAZODONE HCL 50 MG PO TABS: 25.0000 mg | ORAL_TABLET | Freq: Every evening | ORAL | Status: DC | PRN

## 2023-06-01 MED ORDER — LACTATED RINGERS IV BOLUS (SEPSIS): 500.0000 mL | Freq: Once | INTRAVENOUS | Status: DC

## 2023-06-01 MED ORDER — PANTOPRAZOLE SODIUM 40 MG PO TBEC: 40.0000 mg | DELAYED_RELEASE_TABLET | Freq: Every day | ORAL | Status: DC

## 2023-06-01 MED ORDER — METHYLPREDNISOLONE SODIUM SUCC 125 MG IJ SOLR
125.0000 mg | Freq: Once | INTRAMUSCULAR | Status: AC
  Administered 2023-06-01: 125 mg via INTRAVENOUS
  Filled 2023-06-01: qty 2

## 2023-06-01 MED ORDER — SODIUM CHLORIDE 0.9 % IV SOLN
500.0000 mg | Freq: Once | INTRAVENOUS | Status: AC
  Administered 2023-06-01: 500 mg via INTRAVENOUS
  Filled 2023-06-01: qty 5

## 2023-06-01 MED ORDER — LACTATED RINGERS IV SOLN: 150.0000 mL/h | INTRAVENOUS | Status: DC

## 2023-06-01 MED ORDER — ACETAMINOPHEN 325 MG PO TABS
650.0000 mg | ORAL_TABLET | Freq: Four times a day (QID) | ORAL | Status: DC | PRN
  Administered 2023-06-03: 650 mg via ORAL
  Filled 2023-06-01: qty 2

## 2023-06-01 MED ORDER — GUAIFENESIN ER 600 MG PO TB12
600.0000 mg | ORAL_TABLET | Freq: Two times a day (BID) | ORAL | Status: DC
  Filled 2023-06-01 (×2): qty 1

## 2023-06-01 MED ORDER — IPRATROPIUM-ALBUTEROL 0.5-2.5 (3) MG/3ML IN SOLN: 3.0000 mL | Freq: Four times a day (QID) | RESPIRATORY_TRACT | Status: DC

## 2023-06-01 MED ORDER — LACTATED RINGERS IV BOLUS (SEPSIS): 1000.0000 mL | Freq: Once | INTRAVENOUS | Status: DC

## 2023-06-01 NOTE — ED Triage Notes (Signed)
Pt c/o increased sob that started today. Ems gave duoneb

## 2023-06-01 NOTE — ED Provider Notes (Addendum)
[place] EMERGENCY DEPARTMENT AT [facility] Provider Note   CSN: [id number] Arrival date & time: 06/01/23  1651     History  Chief Complaint  Patient presents with   Shortness of Breath    [name] [name] is a 73 y.o. male.  Patient brought in by EMS.  Patient states he got very short of breath today.  He uses albuterol at home.  But does not use oxygen.  Also has a history of atrial fibrillation and is on Coumadin.  Patient states she has had a cough but is been no fever.  Is been no nausea no vomiting.  Past medical history significant for tobacco abuse.  Coronary artery disease peripheral vascular disease abdominal aortic aneurysm s/p repair in 2017 combined systolic diastolic congestive heart failure history of chronic kidney disease stage IV COPD as mentioned pseudoaneurysm of aorta.  Surgical wise patient's had the abdominal aortic aneurysm repair in 2017 gallbladder removed in 2020 and a dialysis catheter insertion in 2017.  Patient denies that he receives dialysis.  But will reconfirm.  EMS gave him a DuoNeb and route.  Patient still with respiratory rate up in the 30s.  Oxygen saturations on room air is around 95%.  He is tachycardic around 113 is consistent with atrial fibs temperature 97.9.  Blood pressure 176/68.  Listening to him upon arrival no active wheezing.       Home Medications Prior to Admission medications   Medication Sig Start Date End Date Taking? Authorizing Provider  albuterol (PROVENTIL) (2.5 MG/3ML) 0.083% nebulizer solution USE 1 VIAL IN NEBULIZER EVERY 4 HOURS AS NEEDED FOR WHEEZING OR SHORTNESS OF BREATH 11/24/21   Anabel Halon, MD  albuterol (VENTOLIN HFA) 108 (90 Base) MCG/ACT inhaler INHALE 1 TO 2 PUFFS BY MOUTH EVERY 6 HOURS AS NEEDED FOR WHEEZING OR SHORTNESS OF BREATH 04/18/23   Anabel Halon, MD  amLODipine (NORVASC) 10 MG tablet Take 1 tablet (10 mg total) by mouth daily. 10/12/22   Anabel Halon, MD  atorvastatin (LIPITOR)  20 MG tablet Take 1 tablet by mouth once daily 05/12/23   Anabel Halon, MD  calcitRIOL (ROCALTROL) 0.25 MCG capsule Take 1 capsule (0.25 mcg total) by mouth daily. 03/25/23   Anabel Halon, MD  fluticasone (FLONASE) 50 MCG/ACT nasal spray Place 1 spray into both nostrils daily as needed for allergies or rhinitis.    [provider]  Fluticasone-Umeclidin-Vilant (TRELEGY ELLIPTA) 100-62.5-25 MCG/ACT AEPB Inhale 1 puff into the lungs daily. 02/01/23   Anabel Halon, MD  furosemide (LASIX) 40 MG tablet TAKE 1/2 TO 1 (ONE-HALF TO ONE) TABLET BY MOUTH ONCE DAILY ALTERNATING  BETWEEN  1/2  ONE  DAY  AND  1  TABLET  THE  NEXT 04/11/23   Antoine Poche, MD  hydrALAZINE (APRESOLINE) 100 MG tablet TAKE 1 TABLET BY MOUTH THREE TIMES DAILY 10/22/22   Antoine Poche, MD  metoprolol succinate (TOPROL-XL) 100 MG 24 hr tablet TAKE 1.5 TABLETS BY MOUTH ONCE DAILY 10/22/22   Antoine Poche, MD  NIFEdipine (ADALAT CC) 30 MG 24 hr tablet Take by mouth. 05/30/23   [provider]  nitroGLYCERIN (NITROSTAT) 0.4 MG SL tablet place 1 tablet under the tongue if needed every 5 minutes for chest pain for 3 doses IF NO RELIEF AFTER 3RD DOSE CALL PRESCRIBER OR 911. 04/21/23   Anabel Halon, MD  pantoprazole (PROTONIX) 40 MG tablet Take 1 tablet (40 mg total) by mouth daily. 10/12/22  Anabel Halon, MD  sodium bicarbonate 650 MG tablet Take 650 mg by mouth 3 (three) times daily. 04/15/23   [provider]  tamsulosin (FLOMAX) 0.4 MG CAPS capsule Take 1 capsule (0.4 mg total) by mouth daily after supper. 10/12/22   Anabel Halon, MD  traZODone (DESYREL) 50 MG tablet Take 0.5-1 tablets (25-50 mg total) by mouth at bedtime as needed for sleep. 04/21/23   Anabel Halon, MD  warfarin (COUMADIN) 5 MG tablet TAKE 1 TO 1 & 1/2 (ONE & ONE-HALF) TABLETS BY MOUTH ONCE DAILY AS DIRECTED BY THE COUMADIN CLINIC 03/01/23   Antoine Poche, MD      Allergies    Patient has no known allergies.     Review of Systems   Review of Systems  Constitutional:  Negative for chills and fever.  HENT:  Negative for ear pain and sore throat.   Eyes:  Negative for pain and visual disturbance.  Respiratory:  Positive for cough and shortness of breath.   Cardiovascular:  Negative for chest pain and palpitations.  Gastrointestinal:  Negative for abdominal pain and vomiting.  Genitourinary:  Negative for dysuria and hematuria.  Musculoskeletal:  Negative for arthralgias and back pain.  Skin:  Negative for color change and rash.  Neurological:  Negative for seizures and syncope.  All other systems reviewed and are negative.   Physical Exam Updated Vital Signs BP (!) 177/82   Pulse (!) 128   Temp 97.9 F (36.6 C) (Oral)   Resp (!) 36   Ht 1.829 m (6')   Wt 78 kg   SpO2 94%   BMI 23.32 kg/m  Physical Exam Vitals and nursing note reviewed.  Constitutional:      General: He is not in acute distress.    Appearance: Normal appearance. He is well-developed. He is ill-appearing.  HENT:     Head: Normocephalic and atraumatic.  Eyes:     Extraocular Movements: Extraocular movements intact.     Conjunctiva/sclera: Conjunctivae normal.     Pupils: Pupils are equal, round, and reactive to light.  Cardiovascular:     Rate and Rhythm: Normal rate and regular rhythm.     Heart sounds: No murmur heard. Pulmonary:     Effort: Respiratory distress present.     Breath sounds: Normal breath sounds. No wheezing, rhonchi or rales.     Comments: Decreased breath sounds bilaterally. Abdominal:     Palpations: Abdomen is soft.     Tenderness: There is no abdominal tenderness.  Musculoskeletal:        General: No swelling.     Cervical back: Normal range of motion and neck supple.  Skin:    General: Skin is warm and dry.     Capillary Refill: Capillary refill takes less than 2 seconds.  Neurological:     General: No focal deficit present.     Mental Status: He is alert and oriented to person,  place, and time.  Psychiatric:        Mood and Affect: Mood normal.     ED Results / Procedures / Treatments   Labs (all labs ordered are listed, but only abnormal results are displayed) Labs Reviewed  BASIC METABOLIC PANEL - Abnormal; Notable for the following components:      Result Value   CO2 16 (*)    Glucose, Bld 189 (*)    BUN 45 (*)    Creatinine, Ser 4.36 (*)    GFR, Estimated 14 (*)  Anion gap 17 (*)    All other components within normal limits  CBC - Abnormal; Notable for the following components:   RBC 2.97 (*)    Hemoglobin 9.2 (*)    HCT 28.5 (*)    All other components within normal limits  PROTIME-INR - Abnormal; Notable for the following components:   Prothrombin Time 35.0 (*)    INR 3.5 (*)    All other components within normal limits  BLOOD GAS, VENOUS - Abnormal; Notable for the following components:   pCO2, Ven 27 (*)    Bicarbonate 17.1 (*)    Acid-base deficit 6.6 (*)    All other components within normal limits  LACTIC ACID, PLASMA - Abnormal; Notable for the following components:   Lactic Acid, Venous 4.8 (*)    All other components within normal limits  RESP PANEL BY RT-PCR (RSV, FLU A&B, COVID)  RVPGX2  CULTURE, BLOOD (ROUTINE X 2)  CULTURE, BLOOD (ROUTINE X 2)  URINE CULTURE  LACTIC ACID, PLASMA  URINALYSIS, ROUTINE W REFLEX MICROSCOPIC    EKG EKG Interpretation Date/Time:  Wednesday June 01 2023 17:23:47 EST Ventricular Rate:  116 PR Interval:    QRS Duration:  127 QT Interval:  359 QTC Calculation: 499 R Axis:   -64  Text Interpretation: Atrial fibrillation Ventricular premature complex Nonspecific IVCD with LAD LVH with secondary repolarization abnormality Anterior Q waves, possibly due to LVH Confirmed by Vanetta Mulders [phone]) on 06/01/2023 5:44:57 PM  Radiology DG Chest Port 1 View Result Date: 06/01/2023 CLINICAL DATA:  COPD. EXAM: PORTABLE CHEST 1 VIEW COMPARISON:  Chest radiograph dated 08/24/2020. FINDINGS: There is  mild cardiomegaly. Diffuse interstitial prominence and nodularity predominantly involving the mid to lower lung field likely represent mild edema. Atypical pneumonia is not excluded. Trace bilateral pleural effusions may be present. No pneumothorax. Atherosclerotic calcification of the aorta. No acute osseous pathology. IMPRESSION: Mild cardiomegaly with mild edema. Atypical pneumonia is not excluded. Electronically Signed   By: Elgie Collard M.D.   On: 06/01/2023 18:43    Procedures Procedures    Medications Ordered in ED Medications  albuterol (VENTOLIN HFA) 108 (90 Base) MCG/ACT inhaler 2 puff (has no administration in time range)  cefTRIAXone (ROCEPHIN) 1 g in sodium chloride 0.9 % 100 mL IVPB ( Intravenous Infusion Verify 06/01/23 2223)  azithromycin (ZITHROMAX) 500 mg in sodium chloride 0.9 % 250 mL IVPB (has no administration in time range)  methylPREDNISolone sodium succinate (SOLU-MEDROL) 125 mg/2 mL injection 125 mg (125 mg Intravenous Given 06/01/23 1818)  ipratropium-albuterol (DUONEB) 0.5-2.5 (3) MG/3ML nebulizer solution 3 mL (3 mLs Nebulization Given 06/01/23 1945)  ondansetron (ZOFRAN) injection 4 mg (4 mg Intravenous Given 06/01/23 1916)    ED Course/ Medical Decision Making/ A&P                                 Medical Decision Making Amount and/or Complexity of Data Reviewed Labs: ordered. Radiology: ordered.  Risk Prescription drug management. Decision regarding hospitalization.   EKG shows atrial fibrillation.  Heart rate in the low 100s.  Chest x-ray reviewed by me.  No obvious infiltrate no evidence of pneumothorax.  CBC white count 9.9 hemoglobin 9.2 platelets 161.  Basic metabolic panel patient's potassium 4.5 GFR 14 creatinine 4.36.  Glucose 189 CO2 16.   Patient still very tachypneic.  Chest x-ray not officially read yet.  Venous blood gas still pending.  Patient will be moved over  to the main side of the ED.  CRITICAL CARE Performed by: Vanetta Mulders Total critical care time: 45 minutes Critical care time was exclusive of separately billable procedures and treating other patients. Critical care was necessary to treat or prevent imminent or life-threatening deterioration. Critical care was time spent personally by me on the following activities: development of treatment plan with patient and/or surrogate as well as nursing, discussions with consultants, evaluation of patient's response to treatment, examination of patient, obtaining history from patient or surrogate, ordering and performing treatments and interventions, ordering and review of laboratory studies, ordering and review of radiographic studies, pulse oximetry and re-evaluation of patient's condition.   Patient's chest x-ray raises some question of atypical infection or pneumonia started on Rocephin and azithromycin.  Lactic acid is elevated at 4.8 he is a little tachycardic.  No evidence of any pulmonary edema.  Gwyndolyn Kaufman rate remains up.  Will start him on BiPAP for that.  Never been hypotensive.  Respiratory panel COVID RSV and influenza is negative surprisingly.  Patient's venous blood gas pH was 7.4 pCO2 is 27 pO2 45.  Basic metabolic panel creatinine 4.36 for GFR 14 this is somewhat baseline for him.  Potassium was good at 4.5.  Did not do the full 30 cc/kg fluid challenge for sepsis.  Still that this is mostly respiratory we will discuss with the hospitalist regarding that.  Most of this is respiratory in nature does have a history of COPD.  Currently not wheezing he says he is breathing feels better but he still has rapid respiratory rate.  The reason for starting the BiPAP.  Patient not a candidate for any CTs because of his renal function.  Discussion with the hospitalist.  Will go ahead and start a 30 cc/kg fluid will along with the sepsis order set.  We both feel that the Rocephin and Zithromax is appropriate at this time.  Patient is being started on BiPAP as well.   Hospitalist will admit.  Patient overall says he is feeling better but his respiratory rates are still very up.  His oxygen sats are starting to get around 90% on 4 L.  Reason for starting the BiPAP.  No wheezing currently.  Patient was treated with DuoNebs as well as Solu-Medrol for the wheezing he had earlier.   Final Clinical Impression(s) / ED Diagnoses Final diagnoses:  SOB (shortness of breath)  Hypoxia  Stage 5 chronic kidney disease not on chronic dialysis [facility])  Acute respiratory failure with hypoxia [facility])    Rx / DC Orders ED Discharge Orders     None         Vanetta Mulders, MD 06/01/23 1757    Vanetta Mulders, MD 06/01/23 1857    Vanetta Mulders, MD 06/01/23 2225    Vanetta Mulders, MD 06/01/23 2239    Vanetta Mulders, MD 06/01/23 2240

## 2023-06-02 ENCOUNTER — Other Ambulatory Visit (HOSPITAL_COMMUNITY): Payer: Self-pay

## 2023-06-02 ENCOUNTER — Inpatient Hospital Stay (HOSPITAL_COMMUNITY): Payer: 59

## 2023-06-02 ENCOUNTER — Encounter: Payer: Self-pay | Admitting: Oncology

## 2023-06-02 ENCOUNTER — Telehealth (HOSPITAL_COMMUNITY): Payer: Self-pay | Admitting: Pharmacy Technician

## 2023-06-02 DIAGNOSIS — I480 Paroxysmal atrial fibrillation: Secondary | ICD-10-CM | POA: Diagnosis present

## 2023-06-02 DIAGNOSIS — J9601 Acute respiratory failure with hypoxia: Secondary | ICD-10-CM

## 2023-06-02 DIAGNOSIS — E785 Hyperlipidemia, unspecified: Secondary | ICD-10-CM | POA: Diagnosis present

## 2023-06-02 DIAGNOSIS — J189 Pneumonia, unspecified organism: Secondary | ICD-10-CM

## 2023-06-02 DIAGNOSIS — A419 Sepsis, unspecified organism: Secondary | ICD-10-CM | POA: Diagnosis not present

## 2023-06-02 DIAGNOSIS — I5043 Acute on chronic combined systolic (congestive) and diastolic (congestive) heart failure: Secondary | ICD-10-CM | POA: Diagnosis not present

## 2023-06-02 DIAGNOSIS — J9690 Respiratory failure, unspecified, unspecified whether with hypoxia or hypercapnia: Secondary | ICD-10-CM | POA: Diagnosis present

## 2023-06-02 DIAGNOSIS — J441 Chronic obstructive pulmonary disease with (acute) exacerbation: Secondary | ICD-10-CM | POA: Diagnosis not present

## 2023-06-02 LAB — URINALYSIS, ROUTINE W REFLEX MICROSCOPIC
Bacteria, UA: NONE SEEN
Bilirubin Urine: NEGATIVE
Glucose, UA: 50 mg/dL — AB
Hgb urine dipstick: NEGATIVE
Ketones, ur: NEGATIVE mg/dL
Leukocytes,Ua: NEGATIVE
Nitrite: NEGATIVE
Protein, ur: 100 mg/dL — AB
Specific Gravity, Urine: 1.017 (ref 1.005–1.030)
pH: 5 (ref 5.0–8.0)

## 2023-06-02 LAB — BASIC METABOLIC PANEL
Anion gap: 11 (ref 5–15)
Anion gap: 15 (ref 5–15)
Anion gap: 13 (ref 5–15)
Anion gap: 13 (ref 5–15)
BUN: 45 mg/dL — ABNORMAL HIGH (ref 8–23)
BUN: 49 mg/dL — ABNORMAL HIGH (ref 8–23)
BUN: 51 mg/dL — ABNORMAL HIGH (ref 8–23)
BUN: 56 mg/dL — ABNORMAL HIGH (ref 8–23)
CO2: 19 mmol/L — ABNORMAL LOW (ref 22–32)
CO2: 19 mmol/L — ABNORMAL LOW (ref 22–32)
CO2: 20 mmol/L — ABNORMAL LOW (ref 22–32)
CO2: 19 mmol/L — ABNORMAL LOW (ref 22–32)
Calcium: 9.7 mg/dL (ref 8.9–10.3)
Calcium: 10 mg/dL (ref 8.9–10.3)
Calcium: 9.6 mg/dL (ref 8.9–10.3)
Calcium: 9.6 mg/dL (ref 8.9–10.3)
Chloride: 108 mmol/L (ref 98–111)
Chloride: 105 mmol/L (ref 98–111)
Chloride: 105 mmol/L (ref 98–111)
Chloride: 107 mmol/L (ref 98–111)
Creatinine, Ser: 4.52 mg/dL — ABNORMAL HIGH (ref 0.61–1.24)
Creatinine, Ser: 4.68 mg/dL — ABNORMAL HIGH (ref 0.61–1.24)
Creatinine, Ser: 4.76 mg/dL — ABNORMAL HIGH (ref 0.61–1.24)
Creatinine, Ser: 5.03 mg/dL — ABNORMAL HIGH (ref 0.61–1.24)
GFR, Estimated: 13 mL/min — ABNORMAL LOW (ref >60)
GFR, Estimated: 13 mL/min — ABNORMAL LOW (ref >60)
GFR, Estimated: 12 mL/min — ABNORMAL LOW (ref >60)
GFR, Estimated: 12 mL/min — ABNORMAL LOW (ref >60)
Glucose, Bld: 180 mg/dL — ABNORMAL HIGH (ref 70–99)
Glucose, Bld: 121 mg/dL — ABNORMAL HIGH (ref 70–99)
Glucose, Bld: 113 mg/dL — ABNORMAL HIGH (ref 70–99)
Glucose, Bld: 112 mg/dL — ABNORMAL HIGH (ref 70–99)
Potassium: 5.3 mmol/L — ABNORMAL HIGH (ref 3.5–5.1)
Potassium: 5.3 mmol/L — ABNORMAL HIGH (ref 3.5–5.1)
Potassium: 5.3 mmol/L — ABNORMAL HIGH (ref 3.5–5.1)
Potassium: 5.3 mmol/L — ABNORMAL HIGH (ref 3.5–5.1)
Sodium: 138 mmol/L (ref 135–145)
Sodium: 139 mmol/L (ref 135–145)
Sodium: 138 mmol/L (ref 135–145)
Sodium: 139 mmol/L (ref 135–145)

## 2023-06-02 LAB — POCT I-STAT 7, (LYTES, BLD GAS, ICA,H+H)
Acid-base deficit: 3 mmol/L — ABNORMAL HIGH (ref 0.0–2.0)
Bicarbonate: 21.6 mmol/L (ref 20.0–28.0)
Calcium, Ion: 1.32 mmol/L (ref 1.15–1.40)
HCT: 22 % — ABNORMAL LOW (ref 39.0–52.0)
Hemoglobin: 7.5 g/dL — ABNORMAL LOW (ref 13.0–17.0)
O2 Saturation: 83 %
Patient temperature: 97.2
Potassium: 5.1 mmol/L (ref 3.5–5.1)
Sodium: 137 mmol/L (ref 135–145)
TCO2: 23 mmol/L (ref 22–32)
pCO2 arterial: 36 mm[Hg] (ref 32–48)
pH, Arterial: 7.382 (ref 7.35–7.45)
pO2, Arterial: 46 mm[Hg] — ABNORMAL LOW (ref 83–108)

## 2023-06-02 LAB — GLUCOSE, CAPILLARY
Glucose-Capillary: 104 mg/dL — ABNORMAL HIGH (ref 70–99)
Glucose-Capillary: 111 mg/dL — ABNORMAL HIGH (ref 70–99)
Glucose-Capillary: 112 mg/dL — ABNORMAL HIGH (ref 70–99)
Glucose-Capillary: 117 mg/dL — ABNORMAL HIGH (ref 70–99)
Glucose-Capillary: 137 mg/dL — ABNORMAL HIGH (ref 70–99)
Glucose-Capillary: 170 mg/dL — ABNORMAL HIGH (ref 70–99)

## 2023-06-02 LAB — CBC
HCT: 28.1 % — ABNORMAL LOW (ref 39.0–52.0)
Hemoglobin: 8.8 g/dL — ABNORMAL LOW (ref 13.0–17.0)
MCH: 30.2 pg (ref 26.0–34.0)
MCHC: 31.3 g/dL (ref 30.0–36.0)
MCV: 96.6 fL (ref 80.0–100.0)
Platelets: 129 10*3/uL — ABNORMAL LOW (ref 150–400)
RBC: 2.91 MIL/uL — ABNORMAL LOW (ref 4.22–5.81)
RDW: 15.7 % — ABNORMAL HIGH (ref 11.5–15.5)
WBC: 6.5 10*3/uL (ref 4.0–10.5)
nRBC: 0 % (ref 0.0–0.2)

## 2023-06-02 LAB — BLOOD GAS, ARTERIAL
Acid-base deficit: 6.9 mmol/L — ABNORMAL HIGH (ref 0.0–2.0)
Bicarbonate: 18.5 mmol/L — ABNORMAL LOW (ref 20.0–28.0)
Drawn by: 283401
O2 Saturation: 100 %
Patient temperature: 37
pCO2 arterial: 36 mm[Hg] (ref 32–48)
pH, Arterial: 7.32 — ABNORMAL LOW (ref 7.35–7.45)
pO2, Arterial: 250 mm[Hg] — ABNORMAL HIGH (ref 83–108)

## 2023-06-02 LAB — PHOSPHORUS
Phosphorus: 4.9 mg/dL — ABNORMAL HIGH (ref 2.5–4.6)
Phosphorus: 5.7 mg/dL — ABNORMAL HIGH (ref 2.5–4.6)

## 2023-06-02 LAB — PROTIME-INR
INR: 3.3 — ABNORMAL HIGH (ref 0.8–1.2)
Prothrombin Time: 34 s — ABNORMAL HIGH (ref 11.4–15.2)

## 2023-06-02 LAB — MRSA NEXT GEN BY PCR, NASAL: MRSA by PCR Next Gen: NOT DETECTED

## 2023-06-02 LAB — MAGNESIUM
Magnesium: 1.1 mg/dL — ABNORMAL LOW (ref 1.7–2.4)
Magnesium: 2.3 mg/dL (ref 1.7–2.4)

## 2023-06-02 LAB — PROCALCITONIN: Procalcitonin: 0.44 ng/mL

## 2023-06-02 LAB — BRAIN NATRIURETIC PEPTIDE: B Natriuretic Peptide: 499 pg/mL — ABNORMAL HIGH (ref 0.0–100.0)

## 2023-06-02 LAB — LACTIC ACID, PLASMA
Lactic Acid, Venous: 3 mmol/L (ref 0.5–1.9)
Lactic Acid, Venous: 1.8 mmol/L (ref 0.5–1.9)

## 2023-06-02 LAB — HIV ANTIBODY (ROUTINE TESTING W REFLEX): HIV Screen 4th Generation wRfx: NONREACTIVE

## 2023-06-02 MED ORDER — METOPROLOL TARTRATE 50 MG PO TABS
50.0000 mg | ORAL_TABLET | Freq: Two times a day (BID) | ORAL | Status: DC
  Administered 2023-06-02 (×2): 50 mg
  Filled 2023-06-02 (×2): qty 1

## 2023-06-02 MED ORDER — INSULIN ASPART 100 UNIT/ML IJ SOLN
0.0000 [IU] | INTRAMUSCULAR | Status: DC
  Administered 2023-06-02 – 2023-06-03 (×2): 2 [IU] via SUBCUTANEOUS
  Administered 2023-06-03 – 2023-06-04 (×2): 1 [IU] via SUBCUTANEOUS

## 2023-06-02 MED ORDER — PROPOFOL 1000 MG/100ML IV EMUL
5.0000 ug/kg/min | INTRAVENOUS | Status: DC
  Administered 2023-06-02: 30 ug/kg/min via INTRAVENOUS
  Administered 2023-06-02: 5 ug/kg/min via INTRAVENOUS
  Administered 2023-06-02 – 2023-06-03 (×3): 30 ug/kg/min via INTRAVENOUS
  Filled 2023-06-02: qty 100
  Filled 2023-06-02: qty 200
  Filled 2023-06-02 (×3): qty 100

## 2023-06-02 MED ORDER — ADULT MULTIVITAMIN W/MINERALS CH
1.0000 | ORAL_TABLET | Freq: Every day | ORAL | Status: DC
  Administered 2023-06-02: 1
  Filled 2023-06-02: qty 1

## 2023-06-02 MED ORDER — METHYLPREDNISOLONE SODIUM SUCC 40 MG IJ SOLR
40.0000 mg | Freq: Two times a day (BID) | INTRAMUSCULAR | Status: DC
  Administered 2023-06-02: 40 mg via INTRAVENOUS
  Filled 2023-06-02: qty 1

## 2023-06-02 MED ORDER — ORAL CARE MOUTH RINSE
15.0000 mL | OROMUCOSAL | Status: DC | PRN
  Administered 2023-06-08: 15 mL via OROMUCOSAL

## 2023-06-02 MED ORDER — SODIUM ZIRCONIUM CYCLOSILICATE 10 G PO PACK
10.0000 g | PACK | Freq: Three times a day (TID) | ORAL | Status: AC
Start: 2023-06-02 — End: 2023-06-02
  Administered 2023-06-02 (×3): 10 g
  Filled 2023-06-02 (×3): qty 1

## 2023-06-02 MED ORDER — POLYETHYLENE GLYCOL 3350 17 G PO PACK
17.0000 g | PACK | Freq: Every day | ORAL | Status: DC
  Administered 2023-06-02: 17 g
  Filled 2023-06-02: qty 1

## 2023-06-02 MED ORDER — UMECLIDINIUM BROMIDE 62.5 MCG/ACT IN AEPB
1.0000 | INHALATION_SPRAY | Freq: Every day | RESPIRATORY_TRACT | Status: DC
  Filled 2023-06-02: qty 7

## 2023-06-02 MED ORDER — REVEFENACIN 175 MCG/3ML IN SOLN
175.0000 ug | Freq: Every day | RESPIRATORY_TRACT | Status: DC
  Administered 2023-06-02 – 2023-06-14 (×13): 175 ug via RESPIRATORY_TRACT
  Filled 2023-06-02 (×14): qty 3

## 2023-06-02 MED ORDER — MAGNESIUM SULFATE 4 GM/100ML IV SOLN
4.0000 g | Freq: Once | INTRAVENOUS | Status: AC
  Administered 2023-06-02: 4 g via INTRAVENOUS
  Filled 2023-06-02: qty 100

## 2023-06-02 MED ORDER — FUROSEMIDE 10 MG/ML IJ SOLN
40.0000 mg | Freq: Two times a day (BID) | INTRAMUSCULAR | Status: DC
  Administered 2023-06-02: 40 mg via INTRAVENOUS
  Filled 2023-06-02: qty 4

## 2023-06-02 MED ORDER — ARFORMOTEROL TARTRATE 15 MCG/2ML IN NEBU
15.0000 ug | INHALATION_SOLUTION | Freq: Two times a day (BID) | RESPIRATORY_TRACT | Status: DC
  Administered 2023-06-02 – 2023-06-14 (×25): 15 ug via RESPIRATORY_TRACT
  Filled 2023-06-02 (×27): qty 2

## 2023-06-02 MED ORDER — MORPHINE SULFATE (PF) 2 MG/ML IV SOLN: 2.0000 mg | INTRAVENOUS | Status: DC | PRN

## 2023-06-02 MED ORDER — ATORVASTATIN CALCIUM 10 MG PO TABS
20.0000 mg | ORAL_TABLET | Freq: Every day | ORAL | Status: DC
  Administered 2023-06-02: 20 mg
  Filled 2023-06-02: qty 2

## 2023-06-02 MED ORDER — PROSOURCE TF20 ENFIT COMPATIBL EN LIQD
60.0000 mL | Freq: Two times a day (BID) | ENTERAL | Status: DC
Start: 2023-06-02 — End: 2023-06-03
  Administered 2023-06-02 (×2): 60 mL
  Filled 2023-06-02 (×2): qty 60

## 2023-06-02 MED ORDER — CHLORHEXIDINE GLUCONATE CLOTH 2 % EX PADS
6.0000 | MEDICATED_PAD | Freq: Every day | CUTANEOUS | Status: DC
  Administered 2023-06-02 – 2023-06-03 (×2): 6 via TOPICAL

## 2023-06-02 MED ORDER — GUAIFENESIN 100 MG/5ML PO LIQD
600.0000 mg | Freq: Two times a day (BID) | ORAL | Status: DC
  Administered 2023-06-02: 600 mg
  Filled 2023-06-02: qty 15

## 2023-06-02 MED ORDER — METHYLPREDNISOLONE SODIUM SUCC 40 MG IJ SOLR
40.0000 mg | INTRAMUSCULAR | Status: DC
  Administered 2023-06-03: 40 mg via INTRAVENOUS
  Filled 2023-06-02: qty 1

## 2023-06-02 MED ORDER — BUDESONIDE 0.25 MG/2ML IN SUSP
0.2500 mg | Freq: Two times a day (BID) | RESPIRATORY_TRACT | Status: DC
  Administered 2023-06-02 – 2023-06-14 (×25): 0.25 mg via RESPIRATORY_TRACT
  Filled 2023-06-02 (×28): qty 2

## 2023-06-02 MED ORDER — FAMOTIDINE 20 MG PO TABS
20.0000 mg | ORAL_TABLET | Freq: Two times a day (BID) | ORAL | Status: DC
  Administered 2023-06-02: 20 mg
  Filled 2023-06-02: qty 1

## 2023-06-02 MED ORDER — WARFARIN - PHARMACIST DOSING INPATIENT: Freq: Every day | Status: DC

## 2023-06-02 MED ORDER — IPRATROPIUM-ALBUTEROL 0.5-2.5 (3) MG/3ML IN SOLN
3.0000 mL | Freq: Four times a day (QID) | RESPIRATORY_TRACT | Status: DC | PRN
  Administered 2023-06-05 – 2023-06-09 (×13): 3 mL via RESPIRATORY_TRACT
  Filled 2023-06-02 (×14): qty 3

## 2023-06-02 MED ORDER — OSMOLITE 1.5 CAL PO LIQD: 1000.0000 mL | ORAL | Status: DC

## 2023-06-02 MED ORDER — IPRATROPIUM-ALBUTEROL 0.5-2.5 (3) MG/3ML IN SOLN
3.0000 mL | Freq: Four times a day (QID) | RESPIRATORY_TRACT | Status: DC
  Administered 2023-06-02: 3 mL via RESPIRATORY_TRACT
  Filled 2023-06-02: qty 3

## 2023-06-02 MED ORDER — FAMOTIDINE 20 MG PO TABS: 20.0000 mg | ORAL_TABLET | Freq: Every day | ORAL | Status: DC

## 2023-06-02 MED ORDER — THIAMINE MONONITRATE 100 MG PO TABS
100.0000 mg | ORAL_TABLET | Freq: Every day | ORAL | Status: DC
  Administered 2023-06-02: 100 mg
  Filled 2023-06-02: qty 1

## 2023-06-02 MED ORDER — FENTANYL 2500MCG IN NS 250ML (10MCG/ML) PREMIX INFUSION
0.0000 ug/h | INTRAVENOUS | Status: DC
  Administered 2023-06-02: 25 ug/h via INTRAVENOUS
  Administered 2023-06-02 – 2023-06-03 (×2): 75 ug/h via INTRAVENOUS
  Filled 2023-06-02 (×3): qty 250

## 2023-06-02 MED ORDER — ETOMIDATE 2 MG/ML IV SOLN
INTRAVENOUS | Status: AC
  Administered 2023-06-02: 20 mg via INTRAVENOUS
  Filled 2023-06-02: qty 10

## 2023-06-02 MED ORDER — ORAL CARE MOUTH RINSE
15.0000 mL | OROMUCOSAL | Status: DC
  Administered 2023-06-02 – 2023-06-03 (×19): 15 mL via OROMUCOSAL

## 2023-06-02 MED ORDER — SODIUM BICARBONATE 650 MG PO TABS
650.0000 mg | ORAL_TABLET | Freq: Three times a day (TID) | ORAL | Status: DC
  Administered 2023-06-02 (×3): 650 mg
  Filled 2023-06-02 (×3): qty 1

## 2023-06-02 MED ORDER — SUCCINYLCHOLINE CHLORIDE 200 MG/10ML IV SOSY
PREFILLED_SYRINGE | INTRAVENOUS | Status: AC
  Administered 2023-06-02: 100 mg via INTRAVENOUS
  Filled 2023-06-02: qty 10

## 2023-06-02 MED ORDER — FLUTICASONE FUROATE-VILANTEROL 100-25 MCG/ACT IN AEPB
1.0000 | INHALATION_SPRAY | Freq: Every day | RESPIRATORY_TRACT | Status: DC
  Filled 2023-06-02: qty 28

## 2023-06-02 NOTE — ED Notes (Signed)
New bottle of propofol given to [facility]

## 2023-06-02 NOTE — Progress Notes (Signed)
[facility] Physician-Brief Progress Note Patient Name: RIAN BUSCHE DOB: 1951/03/27 MRN: [id number]   Date of Service  06/02/2023  HPI/Events of Note  Patient seen in the ED at [facility] ED with pneumonia and acute on chronic respiratory failure requiring intubation and mechanical ventilation. He was transferred to [facility] for a higher level of care.  eICU Interventions  New Patient Evaluation.        Thomasene Lot Kaliq Lege 06/02/2023, 5:03 AM

## 2023-06-02 NOTE — Assessment & Plan Note (Signed)
-   We will continue his Coumadin as well as Toprol-XL. - We will utilize IV Cardizem as needed.  His rate should improve with above management.

## 2023-06-02 NOTE — Assessment & Plan Note (Signed)
-   This is clearly contributing to his acute respiratory failure. - Bronchodilator therapy will be provided with scheduled and as needed DuoNebs as well as mucolytic therapy as mentioned above. - We will hold off long-acting beta agonist. - We will continue on BiPAP.

## 2023-06-02 NOTE — ED Notes (Signed)
Report given to ICU RN.  All questions answered.

## 2023-06-02 NOTE — Progress Notes (Signed)
Per Lauren PA, ok not to start TF today d/t gastric output from OG.

## 2023-06-02 NOTE — H&P (Addendum)
NAME:  [name] [name], MRN:  [id number], DOB:  04/05/1951, LOS: 1 ADMISSION DATE:  06/01/2023, CONSULTATION DATE:  06/02/2023 REFERRING MD:  Dr. Arville Care CHIEF COMPLAINT:  Shortness of Breath   History of Present Illness:  [name] [name] is a 73 y.o. male with a PMH significant for HTN, HLD, AAA s/p repair, MI, CAD, Combined systolic/diastolic HF, A-Fib on coumadin, Stroke, PVD, COPD, Tobacco use, CKD stage IV, GERD, who presented to [facility] ER 1/29 with acute onset worsening dyspnea, associated dry cough, and wheezing.  Per chart review, he denied fever, chills, nausea, vomiting. Upon arrival he was in tachycardic, tachypneic, BP 174/64, and placed on Bipap. Labs revealed ABG PH 7.41, HCO3 17.1, Lacitc 4.8->4.6, INR 3.5, RVP negative, BUN 47, creat 4.4, Hgb 9.2. EKG with Afib RVR rate 119. A chest xray was obtained showing mild cardiomegaly, mild edema, and possible atypical pneumonia. Patient was started on Ceftriaxone and Zithromax, given 125 mg Solu-Medrol and 2.5 liter IVF bolus. He was planned to be admitted to the step down unit, however, with worsening hypoxia patient was intubated 1/30 and transferred to [facility] ICU for further management.   Pertinent  Medical History  HTN/HLD Afib on Coumadin MI/CAD COPD Tobacco Use CKD stage IV  Significant Hospital Events: Including procedures, antibiotic start and stop dates in addition to other pertinent events   1/29: Acute respiratory on Bipap, requiring intubation. COPD exacerbation. Suspected PNA. Ceftriaxone/Zithromax/Steroids/Nebs  Interim History / Subjective:  Patient transferred to ICU at [facility] He is currently sedated on the ventilator. Will wean vent as tolerated and continue current management  Objective   Blood pressure 127/69, pulse (!) 102, temperature 98.9 F (37.2 C), resp. rate 17, height 6' (1.829 m), weight 78 kg, SpO2 100%.    Vent Mode: PRVC FiO2 (%):  [50 %-100 %] 100 % Set Rate:  [16 bmp] 16 bmp Vt Set:  [620 mL] 620  mL PEEP:  [6 cmH20] 6 cmH20 Pressure Support:  [6 cmH20] 6 cmH20   Intake/Output Summary (Last 24 hours) at 06/02/2023 0352 Last data filed at 06/01/2023 2356 Gross per 24 hour  Intake 1369.07 ml  Output --  Net 1369.07 ml   Filed Weights   06/01/23 1658  Weight: 78 kg  Physical Exam Vitals reviewed.  Constitutional:      Appearance: He is normal weight.     Comments: NAD, Sedated  HENT:     Head: Normocephalic and atraumatic.     Mouth/Throat:     Mouth: Mucous membranes are moist.  Eyes:     Pupils: Pupils are equal, round, and reactive to light.     Comments: pinpoint  Cardiovascular:     Comments: Afib, rate controlled. Radial pulses +2/pedal +1 No edema Pulmonary:     Comments: Intubated Course and wheezes throughout Abdominal:     General: Bowel sounds are normal.     Palpations: Abdomen is soft.  Musculoskeletal:        General: Normal range of motion.     Cervical back: Normal range of motion.  Skin:    General: Skin is warm and dry.     Capillary Refill: Capillary refill takes less than 2 seconds.  Neurological:     Comments: [facility] Problem list   None  Assessment & Plan:  Acute Hypoxic Respiratory Failure due to COPD exacerbation and suspected PNA COPD with Acute Exacerbation Tobacco Use -Intubated 1/30 for Hypoxia -Oxygen as needed to achieve SpO2 > 88% -VAP  protocol, Daily SBT/SAT -Continue Ceftriaxone and Zithromax -s/p Soul-Medrol 125 mg, continue 40 mg Q 12 hours -Duonebs Q 6 hours, and PRN -Home Treligy -Robitussin BID -Sedation: Propofol and Fentanyl infusions -Tobacco cessation education   Severe Sepsis due to suspected Atypical Pneumonia -Antibiotics as above -Cultures pending: BC x 2, Sputum Cx -Procal pending -RVP and UA negative -Trend Lactic acid until clears -s/p 2.5 liters IVF, Fluids continue @ 75 ml/hr - Discontinue once lactic clears   Paroxysmal atrial fibrillation with RVR  Combined  Systolic/Diastolic HF (echo 08/2019 50-55%) -Pharmacy consulted for Coumadin management, INR 3.5 (goal 2-3) -Continue home Metoprolol in divided doses 50 mg BID -Holding home Lasix -BNP 499   Hyperlipidemia -Continue home Statin   Essential hypertension -Holding home antihypertensives   CKD stage IV Anemia of Chronic Kidney Disease Hypomagnesemia  -Daily BMP -Morning K level 5.3, will give Lokelma 10 mg x 3 doses and consult Renal for possible dialysis -Magnesium 4 grams now, Repeat BMP at Mag level at 10 am -Sodium Bicarbonate TID -Avoid nephrotoxic agents -Follows with Dr. Wolfgang Phoenix, appears patient is planning to start dialysis soon (possibly peritoneal)  Best Practice (right click and "Reselect all SmartList Selections" daily)   Diet/type: NPO DVT prophylaxis Coumadin Pressure ulcer(s): None GI prophylaxis: H2B Lines: N/A Foley:  Yes, and it is still needed Code Status:  full code Last date of multidisciplinary goals of care discussion [Family not at bedside]  Labs   CBC: Recent Labs  Lab 05/31/23 0947 06/01/23 1718  WBC 6.0 9.9  NEUTROABS 4.1  --   HGB 10.0* 9.2*  HCT 31.4* 28.5*  MCV 95.4 96.0  PLT 170 161   Basic Metabolic Panel: Recent Labs  Lab 05/31/23 0947 06/01/23 1718  NA 139 139  K 4.4 4.5  CL 105 106  CO2 20* 16*  GLUCOSE 97 189*  BUN 47* 45*  CREATININE 4.40* 4.36*  CALCIUM 9.9 9.9   GFR: Estimated Creatinine Clearance: 17.3 mL/min (A) (by C-G formula based on SCr of 4.36 mg/dL (H)). Recent Labs  Lab 05/31/23 0947 06/01/23 1718 06/01/23 2038 06/01/23 2237  WBC 6.0 9.9  --   --   LATICACIDVEN  --   --  4.8* 4.6*   Liver Function Tests: Recent Labs  Lab 05/31/23 0947  AST 14*  ALT 13  ALKPHOS 59  BILITOT 0.6  PROT 7.3  ALBUMIN 3.5   ABG    Component Value Date/Time   PHART 7.32 (L) 06/02/2023 0305   PCO2ART 36 06/02/2023 0305   PO2ART 250 (H) 06/02/2023 0305   HCO3 18.5 (L) 06/02/2023 0305   TCO2 26 06/15/2015 1333    ACIDBASEDEF 6.9 (H) 06/02/2023 0305   O2SAT 100 06/02/2023 0305    Coagulation Profile: Recent Labs  Lab 06/01/23 1806  INR 3.5*   CBG: No results for input(s): "GLUCAP" in the last 168 hours.  Review of Systems:   Unable to obtain intubated/sedated  Past Medical History:  He,  has a past medical history of AAA (abdominal aortic aneurysm) (HCC), Anemia, Arteriosclerotic cardiovascular disease (ASCVD), Calculus of gallbladder with acute cholecystitis without obstruction, Cerebrovascular disease (2002), Chronic anticoagulation, Chronic combined systolic and diastolic CHF (congestive heart failure) (HCC), CKD (chronic kidney disease), stage IV (HCC), COPD (chronic obstructive pulmonary disease) (HCC), GERD (gastroesophageal reflux disease), Hyperlipidemia, Hypertension, Myocardial infarction (HCC) (10 yrs ago), Nephrolithiasis, Permanent atrial fibrillation (HCC), 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   Surgical  History:   Past Surgical History:  Procedure Laterality Date   ABDOMINAL AORTIC ANEURYSM REPAIR N/A 06/09/2015   Procedure: ANEURYSM ABDOMINAL AORTIC REPAIR;  Surgeon: Sherren Kerns, MD;  Location: [facility] OR;  Service: Vascular;  Laterality: N/A;   AORTIC ENDARTERECETOMY N/A 06/09/2015   Procedure: AORTIC ENDARTERECETOMY;  Surgeon: Sherren Kerns, MD;  Location: [facility] OR;  Service: Vascular;  Laterality: N/A;   AORTIC/RENAL BYPASS Left 06/09/2015   Procedure: LEFT RENAL Artery BYPASS;  Surgeon: Sherren Kerns, MD;  Location: [facility] OR;  Service: Vascular;  Laterality: Left;   APPENDECTOMY  2004   CHOLECYSTECTOMY N/A 09/18/2018   Procedure: LAPAROSCOPIC CHOLECYSTECTOMY;  Surgeon: Franky Macho, MD;  Location: [facility] ORS;  Service: General;  Laterality: N/A;   COLONOSCOPY  06/17/2011   INCOMPLETE, PREP POOR. Procedure: COLONOSCOPY;  Surgeon: Corbin Ade, MD;  Location: [facility] ENDO SUITE;   Service: Endoscopy;  Laterality: N/A;  10:00   COLONOSCOPY  07/15/2011   [id number] rectal and colon polyps   COLONOSCOPY N/A 05/22/2015   KVQ:[id number] colonic and rectal polyps. tubular adenomas.repeat TCS 05/2018   ESOPHAGEAL DILATION N/A 05/22/2015   Procedure: ESOPHAGEAL DILATION;  Surgeon: Corbin Ade, MD;  Location: [facility] ENDO SUITE;  Service: Endoscopy;  Laterality: N/A;   ESOPHAGOGASTRODUODENOSCOPY  06/17/2011   severe erosive/ulcerative reflux esophagitis, soft noncritical stricture dilatied, small hh, antral erosion    ESOPHAGOGASTRODUODENOSCOPY N/A 05/22/2015   RMR: 2 cm HH otherwise normal. s/p empirical dilation.   INSERTION OF DIALYSIS CATHETER Left 06/26/2015   Procedure: INSERTION OF DIALYSIS CATHETER;  Surgeon: Chuck Hint, MD;  Location: [facility] OR;  Service: Vascular;  Laterality: Left;   PERIPHERAL VASCULAR CATHETERIZATION N/A 05/28/2015   Procedure: Abdominal Aortogram;  Surgeon: Nada Libman, MD;  Location: [facility] INVASIVE CV LAB;  Service: Cardiovascular;  Laterality: N/A;   testicular cancer  1990   right orchiectomy     Social History:   reports that he has been smoking cigarettes. He started smoking about 41 years ago. He has a 20.7 pack-year smoking history. He has never used smokeless tobacco. He reports that he does not drink alcohol and does not use drugs.   Family History:  His family history includes Colon cancer (age of onset: 46) in his father; Heart disease in his mother; Prostate cancer in his father. There is no history of Liver disease, Anesthesia problems, Hypotension, Malignant hyperthermia, or Pseudochol deficiency.   Allergies No Known Allergies   Home Medications  Prior to Admission medications   Medication Sig Start Date End Date Taking? Authorizing Provider  albuterol (PROVENTIL) (2.5 MG/3ML) 0.083% nebulizer solution USE 1 VIAL IN NEBULIZER EVERY 4 HOURS AS NEEDED FOR WHEEZING OR SHORTNESS OF BREATH 11/24/21   Anabel Halon, MD  albuterol  (VENTOLIN HFA) 108 (90 Base) MCG/ACT inhaler INHALE 1 TO 2 PUFFS BY MOUTH EVERY 6 HOURS AS NEEDED FOR WHEEZING OR SHORTNESS OF BREATH 04/18/23   Anabel Halon, MD  amLODipine (NORVASC) 10 MG tablet Take 1 tablet (10 mg total) by mouth daily. 10/12/22   Anabel Halon, MD  atorvastatin (LIPITOR) 20 MG tablet Take 1 tablet by mouth once daily 05/12/23   Anabel Halon, MD  calcitRIOL (ROCALTROL) 0.25 MCG capsule Take 1 capsule (0.25 mcg total) by mouth daily. 03/25/23   Anabel Halon, MD  fluticasone (FLONASE) 50 MCG/ACT nasal spray Place 1 spray into both nostrils daily as needed for allergies or rhinitis.    [provider]  Fluticasone-Umeclidin-Vilant (TRELEGY ELLIPTA) 100-62.5-25  MCG/ACT AEPB Inhale 1 puff into the lungs daily. 02/01/23   Anabel Halon, MD  furosemide (LASIX) 40 MG tablet TAKE 1/2 TO 1 (ONE-HALF TO ONE) TABLET BY MOUTH ONCE DAILY ALTERNATING  BETWEEN  1/2  ONE  DAY  AND  1  TABLET  THE  NEXT 04/11/23   Antoine Poche, MD  hydrALAZINE (APRESOLINE) 100 MG tablet TAKE 1 TABLET BY MOUTH THREE TIMES DAILY 10/22/22   Antoine Poche, MD  metoprolol succinate (TOPROL-XL) 100 MG 24 hr tablet TAKE 1.5 TABLETS BY MOUTH ONCE DAILY 10/22/22   Antoine Poche, MD  NIFEdipine (ADALAT CC) 30 MG 24 hr tablet Take by mouth. 05/30/23   [provider]  nitroGLYCERIN (NITROSTAT) 0.4 MG SL tablet place 1 tablet under the tongue if needed every 5 minutes for chest pain for 3 doses IF NO RELIEF AFTER 3RD DOSE CALL PRESCRIBER OR 911. 04/21/23   Anabel Halon, MD  pantoprazole (PROTONIX) 40 MG tablet Take 1 tablet (40 mg total) by mouth daily. 10/12/22   Anabel Halon, MD  sodium bicarbonate 650 MG tablet Take 650 mg by mouth 3 (three) times daily. 04/15/23   [provider]  tamsulosin (FLOMAX) 0.4 MG CAPS capsule Take 1 capsule (0.4 mg total) by mouth daily after supper. 10/12/22   Anabel Halon, MD  traZODone (DESYREL) 50 MG tablet Take 0.5-1 tablets (25-50 mg  total) by mouth at bedtime as needed for sleep. 04/21/23   Anabel Halon, MD  warfarin (COUMADIN) 5 MG tablet TAKE 1 TO 1 & 1/2 (ONE & ONE-HALF) TABLETS BY MOUTH ONCE DAILY AS DIRECTED BY THE COUMADIN CLINIC 03/01/23   Antoine Poche, MD     Critical care time: 99    I have discussed this patient with Dr. Briant Sites, she is in agreement with my assessment and plan.

## 2023-06-02 NOTE — Sepsis Progress Note (Signed)
Following for sepsis monitoring ?

## 2023-06-02 NOTE — H&P (Addendum)
[place]   PATIENT NAME: [name] [name]    MR#:  [id number]  DATE OF BIRTH:  1951/01/22  DATE OF critical care note:  06/01/2023  PRIMARY CARE PHYSICIAN: Anabel Halon, MD   Patient is coming from: Home  REQUESTING/REFERRING PHYSICIAN: Vanetta Mulders, MD  CHIEF COMPLAINT:   Chief Complaint  Patient presents with   Shortness of Breath    HISTORY OF PRESENT ILLNESS:  [name] [name] is a 73 y.o. male with medical history significant for stage V CKD, CVA, COPD, GERD, hypertension, dyslipidemia and ongoing tobacco abuse, who presented to the emergency room with acute onset of worsening dyspnea with associated dry cough and wheezing since this morning.  He denied any fever or chills.  No nausea or vomiting or abdominal pain.  He is not on home oxygen.  He was significantly tachypneic when he came with tachycardia and respiratory distress.  He was placed on BiPAP.  No recent sick exposure.  He continues to smoke 1 pack of symptoms per day.  He denied any dysuria, oliguria or hematuria or flank pain.  No bleeding diathesis.  ED Course: When he came to the ER, BP was 174/64 with heart rate 125 respirate of 28 and later respiratory it was up to 41.  Labs revealed blood gas with pH 7.41 and HCO3 of 17.1.  Lactic acid was 4.8 and later 4.6 INR was 3.5 on Coumadin and PTT 35.  Respiratory panel came back negative blood cultures were sent.  CMP revealed a BUN of 47 and creatinine 4.4 better than previous levels and close to baseline with CO2 of 20 and otherwise unremarkable CMP.  CBC showed hemoglobin 9.2 hematocrit 28.5 close to baseline. EKG as reviewed by me : EKG showed atrial fibrillation with RVR of 119 with nonspecific intraventricular conduction delay with LAD and LVH with Q waves in through anteroseptally and inferiorly. Imaging: Portable chest x-ray showed mild cardiomegaly with mild edema and atypical pneumonia was not excluded.  The patient was given 4 mg of IV Zofran and 125  mg of IV Solu-Medrol, DuoNeb as well as IV Rocephin and Zithromax with 1 L bolus of IV lactated Ringer.  He will be admitted to a stepdown unit bed for further evaluation and management. PAST MEDICAL HISTORY:   Past Medical History:  Diagnosis Date   AAA (abdominal aortic aneurysm) (HCC)    a. s/p repair 06/2015 with left renal artery bypass at that time, post-op course c/b renal failure requiring dialysis, C dif.   Anemia    Arteriosclerotic cardiovascular disease (ASCVD)    a. AMI in 2000 treated at [facility]. b. cath in 12/2006->  Chronic total obstruction of the RCA;  drug-eluting stent placed in OM1, LVEF abnormal.   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   PVD (peripheral vascular disease) (HCC)    Ct angiogram in 2009 revealed stable disease with 80% celiac stenosis,50% right renal artery ,ASCVD with ulceration in the abdominal  aortashe   Testicular carcinoma (HCC) 1990   right orchiectomy   Tobacco abuse, in remission    20 pack years; quit in 2009    PAST SURGICAL HISTORY:   Past Surgical History:  Procedure Laterality Date   ABDOMINAL AORTIC ANEURYSM REPAIR N/A 06/09/2015   Procedure: ANEURYSM ABDOMINAL AORTIC REPAIR;  Surgeon: Sherren Kerns, MD;  Location: [facility] OR;  Service: Vascular;  Laterality: N/A;   AORTIC ENDARTERECETOMY N/A 06/09/2015   Procedure: AORTIC ENDARTERECETOMY;  Surgeon: Sherren Kerns, MD;  Location: [facility] OR;  Service: Vascular;  Laterality: N/A;   AORTIC/RENAL  BYPASS Left 06/09/2015   Procedure: LEFT RENAL Artery BYPASS;  Surgeon: Sherren Kerns, MD;  Location: [facility] OR;  Service: Vascular;  Laterality: Left;   APPENDECTOMY  2004   CHOLECYSTECTOMY N/A 09/18/2018   Procedure: LAPAROSCOPIC CHOLECYSTECTOMY;  Surgeon: Franky Macho, MD;  Location: [facility] ORS;  Service: General;  Laterality: N/A;   COLONOSCOPY  06/17/2011   INCOMPLETE, PREP POOR. Procedure: COLONOSCOPY;  Surgeon: Corbin Ade, MD;  Location: [facility] ENDO SUITE;  Service: Endoscopy;  Laterality: N/A;  10:00   COLONOSCOPY  07/15/2011   WJX:[id number] rectal and colon polyps   COLONOSCOPY N/A 05/22/2015   AOZ:HYQMVHQI colonic and rectal polyps. tubular adenomas.repeat TCS 05/2018   ESOPHAGEAL DILATION N/A 05/22/2015   Procedure: ESOPHAGEAL DILATION;  Surgeon: Corbin Ade, MD;  Location: [facility] ENDO SUITE;  Service: Endoscopy;  Laterality: N/A;   ESOPHAGOGASTRODUODENOSCOPY  06/17/2011   severe erosive/ulcerative reflux esophagitis, soft noncritical stricture dilatied, small hh, antral erosion    ESOPHAGOGASTRODUODENOSCOPY N/A 05/22/2015   RMR: 2 cm HH otherwise normal. s/p empirical dilation.   INSERTION OF DIALYSIS CATHETER Left 06/26/2015   Procedure: INSERTION OF DIALYSIS CATHETER;  Surgeon: Chuck Hint, MD;  Location: [facility] OR;  Service: Vascular;  Laterality: Left;   PERIPHERAL VASCULAR CATHETERIZATION N/A 05/28/2015   Procedure: Abdominal Aortogram;  Surgeon: Nada Libman, MD;  Location: [facility] INVASIVE CV LAB;  Service: Cardiovascular;  Laterality: N/A;   testicular cancer  1990   right orchiectomy    SOCIAL HISTORY:   Social History   Tobacco Use   Smoking status: Every Day    Current packs/day: 0.50    Average packs/day: 0.5 packs/day for 41.4 years (20.7 ttl pk-yrs)    Types: Cigarettes    Start date: 01/04/1982   Smokeless tobacco: Never   Tobacco comments:    1/2 pack daily  Substance Use Topics   Alcohol use: No    Alcohol/week: 0.0 standard drinks of alcohol    FAMILY HISTORY:    Family History  Problem Relation Age of Onset   Colon cancer Father 41       deceased   Prostate cancer Father    Heart disease Mother    Liver disease Neg Hx    Anesthesia problems Neg Hx    Hypotension Neg Hx    Malignant hyperthermia Neg Hx    Pseudochol deficiency Neg Hx     DRUG ALLERGIES:  No Known Allergies  REVIEW OF SYSTEMS:   ROS As per history of present illness. All pertinent systems were reviewed above. Constitutional, HEENT, cardiovascular, respiratory, GI, GU, musculoskeletal, neuro, psychiatric, endocrine, integumentary and hematologic systems were reviewed and are otherwise negative/unremarkable except for positive findings mentioned above in the HPI.   MEDICATIONS AT HOME:   Prior to Admission medications   Medication Sig Start Date End Date Taking? Authorizing Provider  albuterol (PROVENTIL) (2.5 MG/3ML) 0.083% nebulizer solution USE 1 VIAL  IN NEBULIZER EVERY 4 HOURS AS NEEDED FOR WHEEZING OR SHORTNESS OF BREATH 11/24/21   Anabel Halon, MD  albuterol (VENTOLIN HFA) 108 (90 Base) MCG/ACT inhaler INHALE 1 TO 2 PUFFS BY MOUTH EVERY 6 HOURS AS NEEDED FOR WHEEZING OR SHORTNESS OF BREATH 04/18/23   Anabel Halon, MD  amLODipine (NORVASC) 10 MG tablet Take 1 tablet (10 mg total) by mouth daily. 10/12/22   Anabel Halon, MD  atorvastatin (LIPITOR) 20 MG tablet Take 1 tablet by mouth once daily 05/12/23   Anabel Halon, MD  calcitRIOL (ROCALTROL) 0.25 MCG capsule Take 1 capsule (0.25 mcg total) by mouth daily. 03/25/23   Anabel Halon, MD  fluticasone (FLONASE) 50 MCG/ACT nasal spray Place 1 spray into both nostrils daily as needed for allergies or rhinitis.    [provider]  Fluticasone-Umeclidin-Vilant (TRELEGY ELLIPTA) 100-62.5-25 MCG/ACT AEPB Inhale 1 puff into the lungs daily. 02/01/23   Anabel Halon, MD  furosemide (LASIX) 40 MG tablet TAKE 1/2 TO 1 (ONE-HALF TO ONE) TABLET BY MOUTH ONCE DAILY ALTERNATING  BETWEEN  1/2  ONE  DAY  AND  1  TABLET   THE  NEXT 04/11/23   Antoine Poche, MD  hydrALAZINE (APRESOLINE) 100 MG tablet TAKE 1 TABLET BY MOUTH THREE TIMES DAILY 10/22/22   Antoine Poche, MD  metoprolol succinate (TOPROL-XL) 100 MG 24 hr tablet TAKE 1.5 TABLETS BY MOUTH ONCE DAILY 10/22/22   Antoine Poche, MD  NIFEdipine (ADALAT CC) 30 MG 24 hr tablet Take by mouth. 05/30/23   [provider]  nitroGLYCERIN (NITROSTAT) 0.4 MG SL tablet place 1 tablet under the tongue if needed every 5 minutes for chest pain for 3 doses IF NO RELIEF AFTER 3RD DOSE CALL PRESCRIBER OR 911. 04/21/23   Anabel Halon, MD  pantoprazole (PROTONIX) 40 MG tablet Take 1 tablet (40 mg total) by mouth daily. 10/12/22   Anabel Halon, MD  sodium bicarbonate 650 MG tablet Take 650 mg by mouth 3 (three) times daily. 04/15/23   [provider]  tamsulosin (FLOMAX) 0.4 MG CAPS capsule Take 1 capsule (0.4 mg total) by mouth daily after supper. 10/12/22   Anabel Halon, MD  traZODone (DESYREL) 50 MG tablet Take 0.5-1 tablets (25-50 mg total) by mouth at bedtime as needed for sleep. 04/21/23   Anabel Halon, MD  warfarin (COUMADIN) 5 MG tablet TAKE 1 TO 1 & 1/2 (ONE & ONE-HALF) TABLETS BY MOUTH ONCE DAILY AS DIRECTED BY THE COUMADIN CLINIC 03/01/23   Antoine Poche, MD      VITAL SIGNS:  Blood pressure 135/68, pulse (!) 112, temperature 98.8 F (37.1 C), resp. rate (!) 23, height 6' (1.829 m), weight 78 kg, SpO2 100%.  PHYSICAL EXAMINATION:  Physical Exam  GENERAL:  73 y.o.-year-old patient lying in the bed with no acute distress.  EYES: Pupils equal, round, reactive to light and accommodation. No scleral icterus. Extraocular muscles intact.  HEENT: Head atraumatic, normocephalic. Oropharynx and nasopharynx clear.  NECK:  Supple, no jugular venous distention. No thyroid enlargement, no tenderness.  LUNGS: Diminished bibasal breath sounds with bibasal crackles with diffuse expiratory wheezes and tight expiratory airflow with harsh  vesicular breathing.. No use of accessory muscles of respiration.  CARDIOVASCULAR: Regular rate and rhythm, S1, S2 normal. No murmurs, rubs, or gallops.  ABDOMEN: Soft, nondistended, nontender. Bowel sounds present. No organomegaly or mass.  EXTREMITIES: No pedal edema, cyanosis, or clubbing.  NEUROLOGIC: Cranial nerves II through  XII are intact. Muscle strength 5/5 in all extremities. Sensation intact. Gait not checked.  PSYCHIATRIC: The patient is alert and oriented x 3.  Normal affect and good eye contact. SKIN: No obvious rash, lesion, or ulcer.   LABORATORY PANEL:   CBC Recent Labs  Lab 06/01/23 1718  WBC 9.9  HGB 9.2*  HCT 28.5*  PLT 161   ------------------------------------------------------------------------------------------------------------------  Chemistries  Recent Labs  Lab 05/31/23 0947 06/01/23 1718  NA 139 139  K 4.4 4.5  CL 105 106  CO2 20* 16*  GLUCOSE 97 189*  BUN 47* 45*  CREATININE 4.40* 4.36*  CALCIUM 9.9 9.9  AST 14*  --   ALT 13  --   ALKPHOS 59  --   BILITOT 0.6  --    ------------------------------------------------------------------------------------------------------------------  Cardiac Enzymes No results for input(s): "TROPONINI" in the last 168 hours. ------------------------------------------------------------------------------------------------------------------  RADIOLOGY:  DG Chest Port 1 View Result Date: 06/01/2023 CLINICAL DATA:  COPD. EXAM: PORTABLE CHEST 1 VIEW COMPARISON:  Chest radiograph dated 08/24/2020. FINDINGS: There is mild cardiomegaly. Diffuse interstitial prominence and nodularity predominantly involving the mid to lower lung field likely represent mild edema. Atypical pneumonia is not excluded. Trace bilateral pleural effusions may be present. No pneumothorax. Atherosclerotic calcification of the aorta. No acute osseous pathology. IMPRESSION: Mild cardiomegaly with mild edema. Atypical pneumonia is not excluded.  Electronically Signed   By: Elgie Collard M.D.   On: 06/01/2023 18:43      IMPRESSION AND PLAN:  Assessment and Plan: Acute respiratory failure with hypoxia (HCC) - This is like secondary to COPD acute exacerbation possibly secondary to atypical pneumonia with associated sepsis. - Differential diagnosis would also include acute on chronic systolic and diastolic CHF. - The patient will be admitted to stepdown unit bed. - We will continue BiPAP. - Will continue antibiotic therapy with IV Rocephin and Zithromax. - Mucolytic therapy as well as bronchodilator therapy will be provided. - Will continue diuresis with his home Lasix.  Sepsis due to pneumonia (HCC) - Sepsis manifested by tachycardia and tachypnea in the setting of suspected atypical pneumonia on his chest x-ray. - The patient meets severe sepsis criteria given significantly elevated lactic acid level. - We will continue hydration for now cautiously with sepsis protocol while monitoring for CHF. - We will continue IV Rocephin and Zithromax. - Management otherwise as above.  COPD with acute exacerbation (HCC) - This is clearly contributing to his acute respiratory failure. - Bronchodilator therapy will be provided with scheduled and as needed DuoNebs as well as mucolytic therapy as mentioned above. - We will hold off long-acting beta agonist. - We will continue on BiPAP.  Paroxysmal atrial fibrillation with RVR (HCC) - We will continue his Coumadin as well as Toprol-XL. - We will utilize IV Cardizem as needed.  His rate should improve with above management.  Dyslipidemia - We will continue statin therapy.  Essential hypertension - We will continue antihypertensive therapy.   DVT prophylaxis: Coumadin. Advanced Care Planning:  Code Status: full code.  This was discussed with him. Family Communication:  The plan of care was discussed in details with the patient (and family). I answered all questions. The patient  agreed to proceed with the above mentioned plan. Further management will depend upon hospital course. Disposition Plan: Back to previous home environment Consults called: none. All the records are reviewed and case discussed with ED provider.  Status is: Inpatient  At the time of the admission, it appears that the appropriate admission status  for this patient is inpatient.  This is judged to be reasonable and necessary in order to provide the required intensity of service to ensure the patient's safety given the presenting symptoms, physical exam findings and initial radiographic and laboratory data in the context of comorbid conditions.  The patient requires inpatient status due to high intensity of service, high risk of further deterioration and high frequency of surveillance required.  I certify that at the time of admission, it is my clinical judgment that the patient will require inpatient hospital care extending more than 2 midnights.                            Dispo: The patient is from: Home              Anticipated d/c is to: Home              Patient currently is not medically stable to d/c.              Difficult to place patient: No Authorized and performed by: Valente David, MD Total critical care time: 60      minutes. Due to a high probability of clinically significant, life-threatening deterioration, the patient required my highest level of preparedness to intervene emergently and I personally spent this critical care time directly and personally managing the patient.  This critical care time included obtaining a history, examining the patient, pulse oximetry, ordering and review of studies, arranging urgent treatment with development of management plan, evaluation of patient's response to treatment, frequent reassessment, and discussions with other providers. This critical care time was performed to assess and manage the high probability of imminent, life-threatening deterioration that  could result in multiorgan failure.  It was exclusive of separately billable procedures and treating other patients and teaching time.   Hannah Beat M.D on 06/02/2023 at 2:15 AM  [facility]   From 7 PM-7 AM, contact night-coverage [email or web address]  CC: Primary care physician; Anabel Halon, MD

## 2023-06-02 NOTE — ED Provider Notes (Signed)
Called to bedside for respiratory failure, patient with likely COPD exacerbation as well as concern for sepsis, failing BiPAP, no longer tolerating it, breathing 30-40 times a minute, saturations only 82% on nonrebreather when he is able to tolerate it.  Hospitalist at bedside discussing the situation with family, goals of care discussed.  Evident that patient requires intubation for airway protection and respiratory failure not responding to other interventions.  See procedural details below.  .Critical Care  Performed by: Sabas Sous, MD Authorized by: Sabas Sous, MD   Critical care provider statement:    Critical care time (minutes):  32   Critical care was necessary to treat or prevent imminent or life-threatening deterioration of the following conditions:  Respiratory failure   Critical care was time spent personally by me on the following activities:  Development of treatment plan with patient or surrogate, discussions with consultants, evaluation of patient's response to treatment, examination of patient, ordering and review of laboratory studies, ordering and review of radiographic studies, ordering and performing treatments and interventions, pulse oximetry, re-evaluation of patient's condition and review of old charts Procedure Name: Intubation Date/Time: 06/02/2023 1:17 AM  Performed by: Sabas Sous, MDPre-anesthesia Checklist: Patient identified, Patient being monitored, Emergency Drugs available, Timeout performed and Suction available Oxygen Delivery Method: Non-rebreather mask Preoxygenation: Pre-oxygenation with 100% oxygen Induction Type: Rapid sequence Ventilation: Mask ventilation without difficulty Laryngoscope Size: Glidescope and 4 Grade View: Grade I Tube size: 7.5 mm Number of attempts: 1 Airway Equipment and Method: Rigid stylet Placement Confirmation: ETT inserted through vocal cords under direct vision, CO2 detector and Breath sounds checked- equal and  bilateral Secured at: 25 cm Tube secured with: ETT holder Comments: RSI using 20 mg etomidate, 100 mg succinylcholine        Sabas Sous, MD 06/02/23 [phone]

## 2023-06-02 NOTE — Progress Notes (Signed)
PHARMACY - ANTICOAGULATION CONSULT NOTE  Pharmacy Consult for Eliquis Indication: atrial fibrillation  No Known Allergies  Patient Measurements: Height: 6' (182.9 cm) Weight: 79.6 kg (175 lb 7.8 oz) IBW/kg (Calculated) : 77.6 Heparin Dosing Weight: n/a  Vital Signs: Temp: 97.3 F (36.3 C) (01/30 0900) Temp Source: Bladder (01/30 0800) BP: 123/59 (01/30 0900) Pulse Rate: 71 (01/30 0900)  Labs: Recent Labs    05/31/23 0947 06/01/23 1718 06/01/23 1806 06/02/23 0451 06/02/23 0824  HGB 10.0* 9.2*  --  8.8* 7.5*  HCT 31.4* 28.5*  --  28.1* 22.0*  PLT 170 161  --  129*  --   LABPROT  --   --  35.0* 34.0*  --   INR  --   --  3.5* 3.3*  --   CREATININE 4.40* 4.36*  --  4.52*  --     Estimated Creatinine Clearance: 16.7 mL/min (A) (by C-G formula based on SCr of 4.52 mg/dL (H)).   Medical History: Past Medical History:  Diagnosis Date   AAA (abdominal aortic aneurysm) (HCC)    a. s/p repair 06/2015 with left renal artery bypass at that time, post-op course c/b renal failure requiring dialysis, C dif.   Anemia    Arteriosclerotic cardiovascular disease (ASCVD)    a. AMI in 2000 treated at [facility]. b. cath in 12/2006->  Chronic total obstruction of the RCA;  drug-eluting stent placed in OM1, LVEF abnormal.   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 Pt is on warfarin PTA. Previously Eliquis copay was not affordable. However, upon copay check this admission, Eliquis $0/mo. Therefore pharmacy consulted to switch anticoagulation from warfarin to Eliquis.  73 yo, 79 kg, Scr 4.52 today - does not meet 2/3 criteria for dose reduction.   INR 3.3 today   Goal of Therapy:  Monitor platelets by anticoagulation protocol: Yes   Plan:  No warfarin today Begin Eliquis when INR is < 2  -plan for Eliquis 5mg  BID when INR decreases below threshold F/u daily INR, s/sx bleeding, CBC  Calton Dach, PharmD, BCCCP Clinical Pharmacist 06/02/2023 10:59 AM

## 2023-06-02 NOTE — Progress Notes (Signed)
PHARMACY - ANTICOAGULATION CONSULT NOTE  Pharmacy Consult for PTA warfarin Indication: atrial fibrillation  No Known Allergies  Patient Measurements: Height: 6' (182.9 cm) Weight: 79.6 kg (175 lb 7.8 oz) IBW/kg (Calculated) : 77.6  Vital Signs: Temp: 97.7 F (36.5 C) (01/30 0530) Temp Source: Oral (01/30 0420) BP: 116/59 (01/30 0530) Pulse Rate: 83 (01/30 0530)  Labs: Recent Labs    05/31/23 0947 06/01/23 1718 06/01/23 1806 06/02/23 0451  HGB 10.0* 9.2*  --  8.8*  HCT 31.4* 28.5*  --  28.1*  PLT 170 161  --  129*  LABPROT  --   --  35.0*  --   INR  --   --  3.5*  --   CREATININE 4.40* 4.36*  --   --     Estimated Creatinine Clearance: 17.3 mL/min (A) (by C-G formula based on SCr of 4.36 mg/dL (H)).  Medications:  PTA warfarin regimen per last anticoag note: Take 2.5 mg PO every Wednesday and Sunday and take 5 mg PO all other days  Assessment: 82 yoM [facility] from [facility] with pneumonia and acute on chronic respiratory failure requiring intubation. Pharmacy consulted to dose warfarin for afib.  -Hgb low 8.8, plts 129, last INR 3.5 (above goal) -last dose of warfarin unknown  Goal of Therapy:  INR 2-3 Monitor platelets by anticoagulation protocol: Yes   Plan:  -Hold warfarin today given above goal -Follow up daily INR  Arabella Merles, PharmD. Clinical Pharmacist 06/02/2023 5:43 AM

## 2023-06-02 NOTE — Telephone Encounter (Signed)
Patient Product/process development scientist completed.    The patient is insured through [facility]. Patient has Medicare and is not eligible for a copay card, but may be able to apply for patient assistance or Medicare RX Payment Plan (Patient Must reach out to their plan, if eligible for payment plan), if available.    Ran test claim for Eliquis 5 mg and the current 30 day co-pay is $0.00.   This test claim was processed through [facility]- copay amounts may vary at other pharmacies due to pharmacy/plan contracts, or as the patient moves through the different stages of their insurance plan.     Roland Earl, CPHT Pharmacy Technician III Certified Patient Advocate [facility] Patient Advocate Team Direct Number: [phone]  Fax: [phone]

## 2023-06-02 NOTE — Progress Notes (Signed)
Initial Nutrition Assessment  DOCUMENTATION CODES:   Non-severe (moderate) malnutrition in context of chronic illness  INTERVENTION:   Initiate tube feeding via OG tube: Osmolite 1.5 at 25 ml/h and increase by 10 ml every 8 hours to goal rate of 55 ml/hr (1320 ml per day)  Prosource TF20 60 ml BID  Provides 2140 kcal, 122 gm protein, 1003 ml free water daily  Monitor magnesium and phosphorus every 12 hours x 4 occurrences, MD to replete as needed, as pt is at risk for refeeding syndrome given pt meets criteria for moderate malnutrition.  MVI with minerals daily via tube  100 mg thiamine daily x 7 days via tube  NUTRITION DIAGNOSIS:   Moderate Malnutrition related to chronic illness (COPD) as evidenced by moderate fat depletion, severe muscle depletion, moderate muscle depletion.  GOAL:   Patient will meet greater than or equal to 90% of their needs  MONITOR:   TF tolerance, Labs  REASON FOR ASSESSMENT:   Consult Enteral/tube feeding initiation and management  ASSESSMENT:   Pt with PMH of HTN, HLD, AAA s/p repair, MI, CAD, CHF, Afib on coumadin, stroke, PVD, COPD, tobacco abuse, CKD stage IV, GERD admitted with acute respiratory failure requiring intubation due to COPD exacerbation and suspected PNA.    Pt discussed during ICU rounds and with RN and MD.  Sherron Monday with daughter in law at bedside. She thinks pt has possibly lost some weight but states he has a good appetite.   Medications reviewed and include: famotidine, SSI, solu-medrol, miralax, 650 mg sodium bicarb TID, sodium zirconium cyclosilicate TID (stop 1/30) Fentanyl  LR @ 75 ml Propofol @ 14 ml/hr provides: 369 kcal   Labs reviewed:  K 5.3 -> 5.1 Phos 4.9 Mag 1.1 CBG's: 137-170  16 F OG tube: initial xray states tip is in the distal esophagus and recommends being advanced   NUTRITION - FOCUSED PHYSICAL EXAM:  Flowsheet Row Most Recent Value  Orbital Region Moderate depletion  Upper Arm Region  Moderate depletion  Thoracic and Lumbar Region Moderate depletion  Buccal Region Unable to assess  Temple Region Severe depletion  Clavicle Bone Region Severe depletion  Clavicle and Acromion Bone Region Moderate depletion  Scapular Bone Region Unable to assess  Dorsal Hand Unable to assess  Patellar Region Severe depletion  Anterior Thigh Region Severe depletion  Posterior Calf Region Moderate depletion  Edema (RD Assessment) Mild  Hair Reviewed  Eyes Unable to assess  Mouth Unable to assess  Skin Reviewed  Nails Reviewed  [pale nail beds]       Diet Order:   Diet Order             Diet NPO time specified  Diet effective now                   EDUCATION NEEDS:   Not appropriate for education at this time  Skin:  Skin Assessment: Reviewed RN Assessment  Last BM:  unknown  Height:   Ht Readings from Last 1 Encounters:  06/01/23 6' (1.829 m)    Weight:   Wt Readings from Last 1 Encounters:  06/02/23 79.6 kg    BMI:  Body mass index is 23.8 kg/m.  Estimated Nutritional Needs:   Kcal:  2000-2300  Protein:  100-120 grams  Fluid:  >2 L/day  Cammy Copa., RD, LDN, CNSC See AMiON for contact information

## 2023-06-02 NOTE — ED Notes (Signed)
OG tube advanced to 65 at the mouth/lip per recommendations of xray

## 2023-06-02 NOTE — Discharge Instructions (Signed)

## 2023-06-02 NOTE — Progress Notes (Addendum)
Critical care note:  Date of note 06/02/2023.  Subjective: The patient deteriorated while he was in the ER with desatting continued tachypnea and tachycardia despite 100% nonrebreather and BiPAP.  He indicated earlier he wanted to be full code.  He denied any chest pain or palpitations.  Objective: Physical examination: Generally: Acutely ill elderly Caucasian male in moderate to severe respiratory distress on BiPAP and later on nonrebreather. Vital signs: BP was 189/70 with heart rate of 130 and respiratory rate of 35 and pulse oximetry was dropping to the low 80s, at 82% on 100% nonrebreather. Head - atraumatic, normocephalic.  Pupils - equal, round and reactive to light and accommodation. Extraocular movements are intact. No scleral icterus.  Oropharynx - moist mucous membranes and tongue. No pharyngeal erythema or exudate.  Neck - supple. No JVD. Carotid pulses 2+ bilaterally. No carotid bruits. No palpable thyromegaly or lymphadenopathy. Cardiovascular - regular rate and rhythm. Normal S1 and S2. No murmurs, gallops or rubs.  Lungs -diffusely coarse breath sounds with harsh vesicular breathing inspiratory and expiratory rhonchi and wheezes and diminished bibasal breath sounds. Abdomen - soft and nontender. Positive bowel sounds. No palpable organomegaly or masses.  Extremities - no pitting edema, clubbing or cyanosis.  Neuro - grossly non-focal. Skin - no rashes. GU and rectal exam - deferred.  Stat portable chest x-ray showed worsening vascular congestion with a ET tube in place.  Assessment/plan: 1.  Acute respiratory failure with hypoxia not responding to BiPAP, requiring intubation and mechanical ventilation, secondary to COPD acute exacerbation, possibly acute on chronic combined systolic and diastolic CHF as well as suspect atypical pneumonia with sepsis. - The patient received RSI with 100 mg of IV succinylcholine, 20 mg IV etomidate and was intubated by Dr. Haywood Filler with 7.5 mm ETT  via glide scope at 25 cm. -The patient was placed on mechanical ventilation with latest settings: PRVC , RR 16, FiO2 100% PEEP 8  - I added IV Lasix and IV steroid therapy with Solu-Medrol. - We will continue other plan of care. - The patient will be transferred from the ER to [facility] ICU. - We will discuss with the ICU team.  Authorized and performed by: Valente David, MD Additional critical care time:  30      minutes. Due to a high probability of clinically significant, life-threatening deterioration, the patient required my highest level of preparedness to intervene emergently and I personally spent this critical care time directly and personally managing the patient.  This critical care time included obtaining a history, examining the patient, pulse oximetry, ordering and review of studies, arranging urgent treatment with development of management plan, evaluation of patient's response to treatment, frequent reassessment, and discussions with other providers. This critical care time was performed to assess and manage the high probability of imminent, life-threatening deterioration that could result in multiorgan failure.  It was exclusive of separately billable procedures and treating other patients and teaching time.

## 2023-06-02 NOTE — Assessment & Plan Note (Addendum)
-   This is like secondary to COPD acute exacerbation possibly secondary to atypical pneumonia with associated sepsis. - Differential diagnosis would also include acute on chronic systolic and diastolic CHF. - The patient will be admitted to stepdown unit bed. - We will continue BiPAP. - Will continue antibiotic therapy with IV Rocephin and Zithromax. - Mucolytic therapy as well as bronchodilator therapy will be provided. - Will continue diuresis with his home Lasix.

## 2023-06-02 NOTE — Assessment & Plan Note (Deleted)
-   We will continue his Coumadin as well as Toprol-XL. - We will utilize IV Cardizem as needed.  His rate should improve with above management.

## 2023-06-02 NOTE — Assessment & Plan Note (Signed)
-   We will continue antihypertensive therapy.

## 2023-06-02 NOTE — Assessment & Plan Note (Signed)
-  We will continue statin therapy.

## 2023-06-02 NOTE — Progress Notes (Signed)
PCCM Interval Progress Note:  Patient admitted ~ 0600 1/30 to ICU from ED. Briefly, 72yoM with significant PMH including AAA s/p repair, HTN, CAD, MI, HF, afib on coumadin, COPD, CKD IV. Initially to [facility] for dyspnea, cough. He was placed on BiPAP in ED. RVP negative. Initial lactic 4.8. in Afib RVR. He had CXR performed showing ?atypical pneumonia and give CAP coverage, steroids. He had worsening hypoxia requiring intubation and transfer to ICU.   General: elderly male, laying in bed, no acute distress  HEENT: PERRLA, mucous membranes dry, ETT, OGT Lungs: diminished R>L, no wheezing or rales  Cardiac: irregularly irregular rhythm, rate ~70s, no JVD or peripheral edeam  Abdomen: rounded, soft, OGT with moderate yellow/gold output  Neuro: sedated which limits exam  Extremities: warm, dry, no pitting edema   Acute Hypoxic Respiratory Failure due to COPD exacerbation and suspected PNA COPD with Acute Exacerbation Tobacco Use Intubated 1/30 for Hypoxia, although on review of vitals I do not see any documented hypoxia. Received CAP Coverage, steroids, nebs prior to arrival.  - f/u full RVP, sputum culture  - con't rocephin, azithromycin - solu-medrol 40mg  daily  - add triple therapy  - duoneb q6h prn - full mechanical vent support - lung protective ventilation 6-8cc/kg Vt - VAP and PAD bundle in place  - titrate FiO2 to sat goal >92  - maintain peak/plats <30, driving pressures <03    Severe Sepsis due to suspected Atypical Pneumonia CXR certainly with RLL pneumonia but query developing multifocal pneumonia. Procal 0.44. lactic peak at 4.8 and has cleared. UA without infection. No GI symptoms.  - con't rocephin, azithromycin  - f/u blood and sputum cultures  - f/u RVP - pulm hygiene   Paroxysmal atrial fibrillation with RVR  Combined Systolic/Diastolic HF (echo 08/2019 50-55%) BNP 499. In afib but rate controlled currently. On coumadin at home. - hold coumadin for now in acute setting   - con't metoprolol 50mg  BID    Hyperlipidemia -Continue home atorvastatin 20mg  daily    Essential hypertension - on home metoprolol    CKD stage IV Anemia of Chronic Kidney Disease Hyperkalemia  Hypomagnesemia  Making urine. K 5.3 on admit. Starting lokelma  - lokelma x3 doses  - sodium bicarb 650mg  TID  - trend bmp, mag, phos - replete elytes - strict I&O - Avoid nephrotoxic agents, renally dose medications - ensure adequate renal perfusion  -Follows with Dr. Wolfgang Phoenix, appears patient is planning to start dialysis soon (possibly peritoneal)  At risk for protein calorie malnutrition  - will current OGT output, holding TF for now

## 2023-06-02 NOTE — Assessment & Plan Note (Signed)
-   Sepsis manifested by tachycardia and tachypnea in the setting of suspected atypical pneumonia on his chest x-ray. - The patient meets severe sepsis criteria given significantly elevated lactic acid level. - We will continue hydration for now cautiously with sepsis protocol while monitoring for CHF. - We will continue IV Rocephin and Zithromax. - Management otherwise as above.

## 2023-06-03 ENCOUNTER — Inpatient Hospital Stay (HOSPITAL_COMMUNITY): Payer: 59

## 2023-06-03 DIAGNOSIS — I4891 Unspecified atrial fibrillation: Secondary | ICD-10-CM | POA: Diagnosis not present

## 2023-06-03 DIAGNOSIS — J441 Chronic obstructive pulmonary disease with (acute) exacerbation: Secondary | ICD-10-CM | POA: Diagnosis not present

## 2023-06-03 DIAGNOSIS — N184 Chronic kidney disease, stage 4 (severe): Secondary | ICD-10-CM

## 2023-06-03 DIAGNOSIS — A419 Sepsis, unspecified organism: Secondary | ICD-10-CM | POA: Diagnosis not present

## 2023-06-03 DIAGNOSIS — J9601 Acute respiratory failure with hypoxia: Secondary | ICD-10-CM | POA: Diagnosis not present

## 2023-06-03 DIAGNOSIS — E440 Moderate protein-calorie malnutrition: Secondary | ICD-10-CM | POA: Insufficient documentation

## 2023-06-03 LAB — BASIC METABOLIC PANEL
Anion gap: 12 (ref 5–15)
Anion gap: 17 — ABNORMAL HIGH (ref 5–15)
Anion gap: 16 — ABNORMAL HIGH (ref 5–15)
BUN: 68 mg/dL — ABNORMAL HIGH (ref 8–23)
BUN: 72 mg/dL — ABNORMAL HIGH (ref 8–23)
BUN: 77 mg/dL — ABNORMAL HIGH (ref 8–23)
CO2: 19 mmol/L — ABNORMAL LOW (ref 22–32)
CO2: 20 mmol/L — ABNORMAL LOW (ref 22–32)
CO2: 20 mmol/L — ABNORMAL LOW (ref 22–32)
Calcium: 9.5 mg/dL (ref 8.9–10.3)
Calcium: 10 mg/dL (ref 8.9–10.3)
Calcium: 9.9 mg/dL (ref 8.9–10.3)
Chloride: 104 mmol/L (ref 98–111)
Chloride: 105 mmol/L (ref 98–111)
Chloride: 102 mmol/L (ref 98–111)
Creatinine, Ser: 5.08 mg/dL — ABNORMAL HIGH (ref 0.61–1.24)
Creatinine, Ser: 5.28 mg/dL — ABNORMAL HIGH (ref 0.61–1.24)
Creatinine, Ser: 5.43 mg/dL — ABNORMAL HIGH (ref 0.61–1.24)
GFR, Estimated: 12 mL/min — ABNORMAL LOW (ref >60)
GFR, Estimated: 11 mL/min — ABNORMAL LOW (ref >60)
GFR, Estimated: 11 mL/min — ABNORMAL LOW (ref >60)
Glucose, Bld: 103 mg/dL — ABNORMAL HIGH (ref 70–99)
Glucose, Bld: 95 mg/dL (ref 70–99)
Glucose, Bld: 136 mg/dL — ABNORMAL HIGH (ref 70–99)
Potassium: 4.9 mmol/L (ref 3.5–5.1)
Potassium: 4.7 mmol/L (ref 3.5–5.1)
Potassium: 4.4 mmol/L (ref 3.5–5.1)
Sodium: 135 mmol/L (ref 135–145)
Sodium: 142 mmol/L (ref 135–145)
Sodium: 138 mmol/L (ref 135–145)

## 2023-06-03 LAB — PHOSPHORUS
Phosphorus: 6.7 mg/dL — ABNORMAL HIGH (ref 2.5–4.6)
Phosphorus: 7 mg/dL — ABNORMAL HIGH (ref 2.5–4.6)
Phosphorus: 6.1 mg/dL — ABNORMAL HIGH (ref 2.5–4.6)

## 2023-06-03 LAB — CBC
HCT: 25.5 % — ABNORMAL LOW (ref 39.0–52.0)
Hemoglobin: 8.1 g/dL — ABNORMAL LOW (ref 13.0–17.0)
MCH: 30.6 pg (ref 26.0–34.0)
MCHC: 31.8 g/dL (ref 30.0–36.0)
MCV: 96.2 fL (ref 80.0–100.0)
Platelets: 127 10*3/uL — ABNORMAL LOW (ref 150–400)
RBC: 2.65 MIL/uL — ABNORMAL LOW (ref 4.22–5.81)
RDW: 15.5 % (ref 11.5–15.5)
WBC: 12.7 10*3/uL — ABNORMAL HIGH (ref 4.0–10.5)
nRBC: 0 % (ref 0.0–0.2)

## 2023-06-03 LAB — MAGNESIUM
Magnesium: 2.5 mg/dL — ABNORMAL HIGH (ref 1.7–2.4)
Magnesium: 2.6 mg/dL — ABNORMAL HIGH (ref 1.7–2.4)
Magnesium: 2.5 mg/dL — ABNORMAL HIGH (ref 1.7–2.4)

## 2023-06-03 LAB — PROTIME-INR
INR: 3 — ABNORMAL HIGH (ref 0.8–1.2)
Prothrombin Time: 31.6 s — ABNORMAL HIGH (ref 11.4–15.2)

## 2023-06-03 LAB — GLUCOSE, CAPILLARY
Glucose-Capillary: 102 mg/dL — ABNORMAL HIGH (ref 70–99)
Glucose-Capillary: 94 mg/dL (ref 70–99)
Glucose-Capillary: 84 mg/dL (ref 70–99)
Glucose-Capillary: 130 mg/dL — ABNORMAL HIGH (ref 70–99)
Glucose-Capillary: 135 mg/dL — ABNORMAL HIGH (ref 70–99)

## 2023-06-03 MED ORDER — THIAMINE MONONITRATE 100 MG PO TABS
100.0000 mg | ORAL_TABLET | Freq: Every day | ORAL | Status: AC
  Administered 2023-06-03 – 2023-06-07 (×5): 100 mg via ORAL
  Filled 2023-06-03 (×5): qty 1

## 2023-06-03 MED ORDER — AZITHROMYCIN 500 MG PO TABS: 500.0000 mg | ORAL_TABLET | Freq: Every evening | ORAL | Status: DC

## 2023-06-03 MED ORDER — ORAL CARE MOUTH RINSE
15.0000 mL | OROMUCOSAL | Status: DC
  Administered 2023-06-03 – 2023-06-05 (×5): 15 mL via OROMUCOSAL

## 2023-06-03 MED ORDER — AMLODIPINE BESYLATE 10 MG PO TABS
10.0000 mg | ORAL_TABLET | Freq: Every day | ORAL | Status: DC
  Administered 2023-06-03 – 2023-06-09 (×7): 10 mg via ORAL
  Filled 2023-06-03 (×7): qty 1

## 2023-06-03 MED ORDER — ATORVASTATIN CALCIUM 10 MG PO TABS
20.0000 mg | ORAL_TABLET | Freq: Every day | ORAL | Status: DC
  Administered 2023-06-03 – 2023-06-09 (×7): 20 mg via ORAL
  Filled 2023-06-03 (×7): qty 2

## 2023-06-03 MED ORDER — HYDRALAZINE HCL 50 MG PO TABS
50.0000 mg | ORAL_TABLET | Freq: Three times a day (TID) | ORAL | Status: DC
  Administered 2023-06-03 – 2023-06-09 (×17): 50 mg via ORAL
  Filled 2023-06-03 (×17): qty 1

## 2023-06-03 MED ORDER — SODIUM BICARBONATE 650 MG PO TABS
650.0000 mg | ORAL_TABLET | Freq: Three times a day (TID) | ORAL | Status: DC
  Administered 2023-06-03 – 2023-06-09 (×18): 650 mg via ORAL
  Filled 2023-06-03 (×18): qty 1

## 2023-06-03 MED ORDER — AZITHROMYCIN 250 MG PO TABS
500.0000 mg | ORAL_TABLET | Freq: Every evening | ORAL | Status: AC
  Administered 2023-06-03 – 2023-06-06 (×4): 500 mg via ORAL
  Filled 2023-06-03 (×2): qty 2
  Filled 2023-06-03: qty 1
  Filled 2023-06-03: qty 2

## 2023-06-03 MED ORDER — METHYLPREDNISOLONE SODIUM SUCC 40 MG IJ SOLR
40.0000 mg | INTRAMUSCULAR | Status: DC
  Administered 2023-06-04 – 2023-06-06 (×3): 40 mg via INTRAVENOUS
  Filled 2023-06-03 (×3): qty 1

## 2023-06-03 MED ORDER — ORAL CARE MOUTH RINSE: 15.0000 mL | OROMUCOSAL | Status: DC | PRN

## 2023-06-03 MED ORDER — POLYETHYLENE GLYCOL 3350 17 G PO PACK
17.0000 g | PACK | Freq: Every day | ORAL | Status: DC
  Administered 2023-06-03: 17 g via ORAL
  Filled 2023-06-03 (×4): qty 1

## 2023-06-03 MED ORDER — ADULT MULTIVITAMIN W/MINERALS CH
1.0000 | ORAL_TABLET | Freq: Every day | ORAL | Status: DC
  Administered 2023-06-04 – 2023-06-09 (×6): 1 via ORAL
  Filled 2023-06-03 (×6): qty 1

## 2023-06-03 MED ORDER — ENSURE ENLIVE PO LIQD
237.0000 mL | Freq: Three times a day (TID) | ORAL | Status: DC
  Administered 2023-06-04 – 2023-06-08 (×9): 237 mL via ORAL

## 2023-06-03 MED ORDER — METOPROLOL TARTRATE 50 MG PO TABS
50.0000 mg | ORAL_TABLET | Freq: Two times a day (BID) | ORAL | Status: DC
  Administered 2023-06-03 – 2023-06-09 (×13): 50 mg via ORAL
  Filled 2023-06-03 (×13): qty 1

## 2023-06-03 NOTE — Progress Notes (Signed)
Nutrition Follow-up  DOCUMENTATION CODES:   Non-severe (moderate) malnutrition in context of chronic illness  INTERVENTION:   Ensure Enlive po TID, each supplement provides 350 kcal and 20 grams of protein. Pt prefers chocolate   Discussed importance of adequate nutrition for weight stabilization, including using ONS to meet nutrition needs.   NUTRITION DIAGNOSIS:   Moderate Malnutrition related to chronic illness (COPD) as evidenced by moderate fat depletion, severe muscle depletion, moderate muscle depletion. Ongoing.   GOAL:   Patient will meet greater than or equal to 90% of their needs Progressing.   MONITOR:   TF tolerance, Labs  REASON FOR ASSESSMENT:   Consult Enteral/tube feeding initiation and management  ASSESSMENT:   Pt with PMH of HTN, HLD, AAA s/p repair, MI, CAD, CHF, Afib on coumadin, stroke, PVD, COPD, tobacco abuse, CKD stage IV, GERD admitted with acute respiratory failure requiring intubation due to COPD exacerbation and suspected PNA.    Pt discussed during ICU rounds and with RN and MD.  TF never started yesterday due to high output from OG.  Per RN pt did well with PO trials post extubation Spoke with NP, ok to start Regular diet  1/30 - intubated, COPD exacerbation, suspected PNA 1/31 - extubated   Spoke with pt, wife, and sister-in-law. Pt has lost weight but feels appetite was good at home. He likes chocolate boost and willing to try ensure here.    Medications reviewed and include: SSI, solu-medrol, MVI with minerals, miralax, polyethylene glycol, thiamine   Labs reviewed:  K 5.3 -> 5.1->4.9->4.7 Phos 4.9 -> 6.7->7.0 Mag 1.1 -> 2.5-> 2.6 CBG's: 102-117  UOP: 530 ml  OG tube: 950 ml - now removed   Diet Order:   Diet Order             Diet regular Room service appropriate? Yes with Assist; Fluid consistency: Thin  Diet effective now                   EDUCATION NEEDS:   Not appropriate for education at this  time  Skin:  Skin Assessment: Reviewed RN Assessment  Last BM:  unknown  Height:   Ht Readings from Last 1 Encounters:  06/01/23 6' (1.829 m)    Weight:   Wt Readings from Last 1 Encounters:  06/03/23 77.8 kg    BMI:  Body mass index is 23.26 kg/m.  Estimated Nutritional Needs:   Kcal:  2000-2300  Protein:  100-120 grams  Fluid:  >2 L/day  Cammy Copa., RD, LDN, CNSC See AMiON for contact information

## 2023-06-03 NOTE — Evaluation (Signed)
Occupational Therapy Evaluation Patient Details Name: [name] [name] MRN: [id number] DOB: 09-27-1950 Today's Date: 06/03/2023   History of Present Illness 73 yo male admitted SOB afib with RVR and intubated PMH HTN HLD AAA s/p repair MI CAD combined systolic and diastolic HF, Afib on coumadin PVD COPD CKD IV smoker   Clinical Impression   PT admitted with SOB and afib with RVR. Pt currently with functional limitiations due to the deficits listed below (see OT problem list). Pt intubated and extubated this admission. Pt noted to have decreased saturation on RA and requires 2L .  Pt will benefit from skilled OT to increase their independence and safety with adls and balance to allow discharge HHOT.        If plan is discharge home, recommend the following: A little help with walking and/or transfers;A little help with bathing/dressing/bathroom    Functional Status Assessment  Patient has had a recent decline in their functional status and demonstrates the ability to make significant improvements in function in a reasonable and predictable amount of time.  Equipment Recommendations  None recommended by OT    Recommendations for Other Services       Precautions / Restrictions Precautions Precautions: Fall Precaution Comments: watch 02      Mobility Bed Mobility Overal bed mobility: Needs Assistance Bed Mobility: Rolling, Supine to Sit Rolling: Mod assist   Supine to sit: Mod assist     General bed mobility comments: (A) to elevate trunk from surface HOB 40 degrees    Transfers Overall transfer level: Needs assistance Equipment used: 2 person hand held assist, Rolling walker (2 wheels) Transfers: Sit to/from Stand Sit to Stand: +2 physical assistance, Mod assist           General transfer comment: pt (A) to power up and pt immediate posterior lean      Balance Overall balance assessment: Needs assistance Sitting-balance support: Bilateral upper extremity  supported, Feet supported Sitting balance-Leahy Scale: Fair   Postural control: Posterior lean Standing balance support: Bilateral upper extremity supported, During functional activity, Reliant on assistive device for balance Standing balance-Leahy Scale: Poor                             ADL either performed or assessed with clinical judgement   ADL Overall ADL's : Needs assistance/impaired                 Upper Body Dressing : Minimal assistance;Sitting   Lower Body Dressing: Moderate assistance Lower Body Dressing Details (indicate cue type and reason): able to don socks in sitting Toilet Transfer: Minimal assistance;Rolling walker (2 wheels)           Functional mobility during ADLs: Minimal assistance;Rolling walker (2 wheels)       Vision Baseline Vision/History: 1 Wears glasses Vision Assessment?: No apparent visual deficits;Wears glasses for reading     Perception         Praxis         Pertinent Vitals/Pain Pain Assessment Pain Assessment: No/denies pain     Extremity/Trunk Assessment Upper Extremity Assessment Upper Extremity Assessment: Overall WFL for tasks assessed   Lower Extremity Assessment Lower Extremity Assessment: Defer to PT evaluation   Cervical / Trunk Assessment Cervical / Trunk Assessment: Normal   Communication Communication Communication: No apparent difficulties   Cognition Arousal: Alert Behavior During Therapy: WFL for tasks assessed/performed Overall Cognitive Status: Within Functional Limits for tasks assessed  General Comments  RA 80% needs 2L [place] 90% with cues to stop and purse lip breath    Exercises     Shoulder Instructions      Home Living Family/patient expects to be discharged to:: Private residence Living Arrangements: Spouse/significant other Available Help at Discharge: Family;Available 24 hours/day Type of Home: House Home Access:  Stairs to enter [employer] of Steps: 4 Entrance Stairs-Rails: Right Home Layout: One level     Bathroom Shower/Tub: Producer, television/film/video: Standard     Home Equipment: Rollator (4 wheels);Cane - single point;Shower seat;BSC/3in1   Additional Comments: 3 dogs      Prior Functioning/Environment Prior Level of Function : Independent/Modified Independent;Driving                        OT Problem List: Decreased strength;Decreased activity tolerance;Impaired balance (sitting and/or standing);Decreased cognition;Decreased safety awareness;Decreased knowledge of use of DME or AE;Decreased knowledge of precautions;Cardiopulmonary status limiting activity;Obesity      OT Treatment/Interventions: Self-care/ADL training;Energy conservation;DME and/or AE instruction;Manual therapy;Therapeutic activities;Patient/family education;Balance training;Therapeutic exercise    OT Goals(Current goals can be found in the care plan section) Acute Rehab OT Goals Patient Stated Goal: to get stronger OT Goal Formulation: With patient Time For Goal Achievement: 06/17/23 Potential to Achieve Goals: Good  OT Frequency: Min 1X/week    Co-evaluation PT/OT/SLP Co-Evaluation/Treatment: Yes Reason for Co-Treatment: For patient/therapist safety   OT goals addressed during session: ADL's and self-care;Proper use of Adaptive equipment and DME      AM-PAC OT "6 Clicks" Daily Activity     Outcome Measure Help from another person eating meals?: A Little Help from another person taking care of personal grooming?: A Little Help from another person toileting, which includes using toliet, bedpan, or urinal?: A Little Help from another person bathing (including washing, rinsing, drying)?: A Little Help from another person to put on and taking off regular upper body clothing?: A Little Help from another person to put on and taking off regular lower body clothing?: A Little 6 Click  Score: 18   End of Session Equipment Utilized During Treatment: Gait belt;Rolling walker (2 wheels);Oxygen Nurse Communication: Mobility status;Precautions  Activity Tolerance: Patient tolerated treatment well Patient left: in chair;with call bell/phone within reach;with chair alarm set  OT Visit Diagnosis: Unsteadiness on feet (R26.81);Muscle weakness (generalized) (M62.81)                Time: 9604-5409 OT Time Calculation (min): 23 min Charges:  OT General Charges $OT Visit: 1 Visit OT Evaluation $OT Eval Moderate Complexity: 1 Mod   Brynn, OTR/L  Acute Rehabilitation Services Office: [phone] .   Mateo Flow 06/03/2023, 2:35 PM

## 2023-06-03 NOTE — Progress Notes (Signed)
PHARMACY - ANTICOAGULATION CONSULT NOTE  Pharmacy Consult for Eliquis Indication: atrial fibrillation  No Known Allergies  Patient Measurements: Height: 6' (182.9 cm) Weight: 77.8 kg (171 lb 8.3 oz) IBW/kg (Calculated) : 77.6 Heparin Dosing Weight: n/a  Vital Signs: Temp: 98.1 F (36.7 C) (01/31 0700) Temp Source: Bladder (01/31 0400) BP: 131/60 (01/31 0700) Pulse Rate: 63 (01/31 0700)  Labs: Recent Labs    06/01/23 1718 06/01/23 1806 06/02/23 0451 06/02/23 0824 06/02/23 0958 06/02/23 1321 06/02/23 2028 06/03/23 0516  HGB 9.2*  --  8.8* 7.5*  --   --   --  8.1*  HCT 28.5*  --  28.1* 22.0*  --   --   --  25.5*  PLT 161  --  129*  --   --   --   --  127*  LABPROT  --  35.0* 34.0*  --   --   --   --  31.6*  INR  --  3.5* 3.3*  --   --   --   --  3.0*  CREATININE 4.36*  --  4.52*  --    < > 4.76* 5.03* 5.08*   < > = values in this interval not displayed.    Estimated Creatinine Clearance: 14.9 mL/min (A) (by C-G formula based on SCr of 5.08 mg/dL (H)).   Medical History: Past Medical History:  Diagnosis Date   AAA (abdominal aortic aneurysm) (HCC)    a. s/p repair 06/2015 with left renal artery bypass at that time, post-op course c/b renal failure requiring dialysis, C dif.   Anemia    Arteriosclerotic cardiovascular disease (ASCVD)    a. AMI in 2000 treated at [facility] b. cath in 12/2006->  Chronic total obstruction of the RCA;  drug-eluting stent placed in OM1, LVEF abnormal.   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 Pt is on warfarin PTA. Previously Eliquis copay was not affordable. However, upon copay check this admission, Eliquis $0/mo. Therefore pharmacy consulted to switch anticoagulation from warfarin to Eliquis.  73 yo, 79 kg, Scr 4.52 today - does not meet 2/3 criteria for dose reduction.   INR 3.0 today   Goal of Therapy:  Monitor platelets by anticoagulation protocol: Yes   Plan:  No warfarin today Begin Eliquis when INR is < 2  -plan for Eliquis 5mg  BID when INR decreases below threshold F/u daily INR, s/sx bleeding, CBC  Calton Dach, PharmD, BCCCP Clinical Pharmacist 06/03/2023 8:04 AM

## 2023-06-03 NOTE — Progress Notes (Signed)
Physical Therapy Treatment Patient Details Name: [name] [name] MRN: [id number] DOB: 20-Oct-1950 Today's Date: 06/03/2023   History of Present Illness 73 yo male admitted SOB afib with RVR and intubated PMH HTN HLD AAA s/p repair MI CAD combined systolic and diastolic HF, Afib on coumadin PVD COPD CKD IV smoker    PT Comments  Pt admitted with/for SOB, afib with RV.  Presently, pt is deconditioned with weakness and balance deficits, needing mod assist overall.  Pt currently limited functionally due to the problems listed below.  (see problems list.)  Pt will benefit from PT to maximize function and safety to be able to get home safely with available assist.     If plan is discharge home, recommend the following: A little help with walking and/or transfers;A little help with bathing/dressing/bathroom;Assistance with cooking/housework;Help with stairs or ramp for entrance   Can travel by private vehicle        Equipment Recommendations  None recommended by PT (? RW)    Recommendations for Other Services       Precautions / Restrictions Precautions Precautions: Fall Precaution Comments: watch 02     Mobility  Bed Mobility Overal bed mobility: Needs Assistance Bed Mobility: Rolling, Supine to Sit Rolling: Mod assist   Supine to sit: Mod assist     General bed mobility comments: (A) to elevate trunk from surface HOB 40 degrees    Transfers Overall transfer level: Needs assistance Equipment used: 2 person hand held assist, Rolling walker (2 wheels) Transfers: Sit to/from Stand Sit to Stand: +2 physical assistance, Mod assist           General transfer comment: pt (A) to power up and pt immediate posterior lean    Ambulation/Gait Ambulation/Gait assistance: Min assist Gait Distance (Feet): 180 Feet Assistive device: 2 person hand held assist Gait Pattern/deviations: Step-through pattern   Gait velocity interpretation: <1.31 ft/sec, indicative of household  ambulator   General Gait Details: mildly unsteady, retropulsive and soft staggering in attempt to recover.  Fairly quick to fatigue.   Stairs             Wheelchair Mobility     Tilt Bed    Modified Rankin (Stroke Patients Only)       Balance Overall balance assessment: Needs assistance Sitting-balance support: Bilateral upper extremity supported, Feet supported Sitting balance-Leahy Scale: Fair   Postural control: Posterior lean Standing balance support: Bilateral upper extremity supported, During functional activity, Reliant on assistive device for balance Standing balance-Leahy Scale: Poor                              Cognition Arousal: Alert Behavior During Therapy: WFL for tasks assessed/performed Overall Cognitive Status: Within Functional Limits for tasks assessed                                          Exercises      General Comments General comments (skin integrity, edema, etc.): RA 80% needs 2L [place] 90% with cues to stop and purse lip breath      Pertinent Vitals/Pain Pain Assessment Pain Assessment: No/denies pain    Home Living Family/patient expects to be discharged to:: Private residence Living Arrangements: Spouse/significant other Available Help at Discharge: Family;Available 24 hours/day Type of Home: House Home Access: Stairs to enter Entrance Stairs-Rails: Right Entrance Stairs-Number of  Steps: 4   Home Layout: One level Home Equipment: Rollator (4 wheels);Cane - single point;Shower seat;BSC/3in1 Additional Comments: 3 dogs    Prior Function            PT Goals (current goals can now be found in the care plan section) Acute Rehab PT Goals Patient Stated Goal: back home PT Goal Formulation: With patient Time For Goal Achievement: 06/16/23 Potential to Achieve Goals: Good    Frequency           PT Plan      Co-evaluation PT/OT/SLP Co-Evaluation/Treatment: Yes Reason for Co-Treatment:  For patient/therapist safety PT goals addressed during session: Mobility/safety with mobility OT goals addressed during session: ADL's and self-care;Proper use of Adaptive equipment and DME      AM-PAC PT "6 Clicks" Mobility   Outcome Measure                   End of Session               Time: 7564-3329 PT Time Calculation (min) (ACUTE ONLY): 23 min  Charges:      PT General Charges $$ ACUTE PT VISIT: 1 Visit                     06/03/2023  Jacinto Halim., PT Acute Rehabilitation Services [phone]  (office)   [name] 06/03/2023, 3:52 PM

## 2023-06-03 NOTE — Progress Notes (Signed)
 Pt placed on PS/CPAP 5/5 on 40% and is tolerating well. RT will monitor.

## 2023-06-03 NOTE — Plan of Care (Addendum)
Patient admitted s/p COPD exacerbation. Patient successfully extubated today. Satting well on 2L nasal canula. OT/PT at bedside able to transfer patient to chair. Patient sat in chair for 2+ hours, tolerated well. PRN tylenol given once for pain, patient expressed relief. Patient and family updated in plan of care. Floor status pending bed availability.   Problem: Fluid Volume: Goal: Hemodynamic stability will improve Outcome: Progressing   Problem: Clinical Measurements: Goal: Diagnostic test results will improve Outcome: Progressing Goal: Signs and symptoms of infection will decrease Outcome: Progressing   Problem: Respiratory: Goal: Ability to maintain adequate ventilation will improve Outcome: Progressing   Problem: Education: Goal: Knowledge of General Education information will improve Description: Including pain rating scale, medication(s)/side effects and non-pharmacologic comfort measures Outcome: Progressing   Problem: Health Behavior/Discharge Planning: Goal: Ability to manage health-related needs will improve Outcome: Progressing   Problem: Clinical Measurements: Goal: Ability to maintain clinical measurements within normal limits will improve Outcome: Progressing Goal: Will remain free from infection Outcome: Progressing Goal: Diagnostic test results will improve Outcome: Progressing Goal: Respiratory complications will improve Outcome: Progressing Goal: Cardiovascular complication will be avoided Outcome: Progressing   Problem: Activity: Goal: Risk for activity intolerance will decrease Outcome: Progressing   Problem: Nutrition: Goal: Adequate nutrition will be maintained Outcome: Progressing   Problem: Coping: Goal: Level of anxiety will decrease Outcome: Progressing   Problem: Elimination: Goal: Will not experience complications related to bowel motility Outcome: Progressing Goal: Will not experience complications related to urinary  retention Outcome: Progressing   Problem: Pain Managment: Goal: General experience of comfort will improve and/or be controlled Outcome: Progressing   Problem: Safety: Goal: Ability to remain free from injury will improve Outcome: Progressing   Problem: Skin Integrity: Goal: Risk for impaired skin integrity will decrease Outcome: Progressing   Problem: Safety: Goal: Non-violent Restraint(s) Outcome: Progressing

## 2023-06-03 NOTE — Procedures (Signed)
Extubation Procedure Note  Patient Details:   Name: [name] [name] DOB: 03-Jan-1951 MRN: [id number]   Airway Documentation:  Airway 7.5 mm (Active)  Secured at (cm) 25 cm 06/03/23 0824  Measured From Lips 06/03/23 0824  Secured Location Left 06/03/23 0824  Secured By [employer] 06/03/23 0824  Bite Block No 06/03/23 0824  Tube Holder Repositioned Yes 06/03/23 0824  Prone position No 06/03/23 0824  Cuff Pressure (cm H2O) Clear OR 27-39 CmH2O 06/03/23 0824  Site Condition Dry 06/03/23 0824   Vent end date: 06/03/23 Vent end time: 0840   Evaluation  O2 sats: stable throughout Complications: No apparent complications Patient did tolerate procedure well. Bilateral Breath Sounds: Clear   Yes  Pt extubated to 2L [place] with RN and CCM at bedside. Positive cuff leak noted and pt is tolerating well. RT will monitor.  Lajuan Lines 06/03/2023, 8:55 AM

## 2023-06-03 NOTE — Plan of Care (Signed)
  Problem: Clinical Measurements: Goal: Signs and symptoms of infection will decrease Outcome: Progressing   Problem: Clinical Measurements: Goal: Will remain free from infection Outcome: Progressing   Problem: Activity: Goal: Risk for activity intolerance will decrease Outcome: Progressing   Problem: Safety: Goal: Ability to remain free from injury will improve Outcome: Progressing   Problem: Fluid Volume: Goal: Hemodynamic stability will improve Outcome: Not Progressing   Problem: Clinical Measurements: Goal: Diagnostic test results will improve Outcome: Not Progressing

## 2023-06-03 NOTE — Progress Notes (Signed)
NAME:  [name] [name], MRN:  [id number], DOB:  Jun 05, 1950, LOS: 2 ADMISSION DATE:  06/01/2023, CONSULTATION DATE:  06/02/2023 REFERRING MD:  Dr. Arville Care CHIEF COMPLAINT:  Shortness of Breath   History of Present Illness:  [name] [name] is a 73 y.o. male with a PMH significant for HTN, HLD, AAA s/p repair, MI, CAD, Combined systolic/diastolic HF, A-Fib on coumadin, Stroke, PVD, COPD, Tobacco use, CKD stage IV, GERD, who presented to [facility] 1/29 with acute onset worsening dyspnea, associated dry cough, and wheezing.  Per chart review, he denied fever, chills, nausea, vomiting. Upon arrival he was in tachycardic, tachypneic, BP 174/64, and placed on Bipap. Labs revealed ABG PH 7.41, HCO3 17.1, Lacitc 4.8->4.6, INR 3.5, RVP negative, BUN 47, creat 4.4, Hgb 9.2. EKG with Afib RVR rate 119. A chest xray was obtained showing mild cardiomegaly, mild edema, and possible atypical pneumonia. Patient was started on Ceftriaxone and Zithromax, given 125 mg Solu-Medrol and 2.5 liter IVF bolus. He was planned to be admitted to the step down unit, however, with worsening hypoxia patient was intubated 1/30 and transferred to [facility] ICU for further management.   Pertinent  Medical History  HTN/HLD Afib on Coumadin MI/CAD COPD Tobacco Use CKD stage IV  Significant Hospital Events: Including procedures, antibiotic start and stop dates in addition to other pertinent events   1/29: Acute respiratory on Bipap, requiring intubation. COPD exacerbation. Suspected PNA. Ceftriaxone/Zithromax/Steroids/Nebs 1/31 Weaned, extubated   Interim History / Subjective:  On vent this morning of 1/31, weaned and extubated   Objective   Blood pressure (!) 166/68, pulse 94, temperature 98.2 F (36.8 C), resp. rate 16, height 6' (1.829 m), weight 77.8 kg, SpO2 93%.    Vent Mode: PSV;CPAP FiO2 (%):  [40 %-50 %] 40 % Set Rate:  [14 bmp] 14 bmp Vt Set:  [620 mL] 620 mL PEEP:  [5 cmH20-8 cmH20] 5 cmH20 Pressure Support:  [5  cmH20] 5 cmH20 Plateau Pressure:  [13 cmH20-16 cmH20] 13 cmH20   Intake/Output Summary (Last 24 hours) at 06/03/2023 1222 Last data filed at 06/03/2023 1000 Gross per 24 hour  Intake 696.31 ml  Output 1605 ml  Net -908.69 ml   Filed Weights   06/01/23 1658 06/02/23 0500 06/03/23 0500  Weight: 78 kg 79.6 kg 77.8 kg   General: acute on chronic older adult male, following commands, pleasant HEENT: Normocephalic, pink MM, poor dentition- OG/ETT removed  Pulm: clear, diminished throughout lung fields, patient extubated with success-no distress CV: s1,s2, irregular, afib controlled, No JVD, No MRG Abs: Soft, Bs active Neuro: Alert, oriented, follows commands GU: foley, intact   Resolved Hospital Problem list   None  Assessment & Plan:  Acute Hypoxic Respiratory Failure due to COPD exacerbation and suspected PNA COPD with Acute Exacerbation Tobacco Use Intubated 1/30 for Hypoxia, although on review of vitals I do not see any documented hypoxia. Received CAP Coverage, steroids, nebs prior to arrival.  Negative for Covid, flu, RSV No growth in BC to date P: Full RVP still pending along with tracheal aspirate, follow upt  Continue rocephin and azithromycin for CAP coverage Continue Solumedrol 40mg  daily  Continue daily therapy of BD, continue duonebs as needed Pt extubated-continue aggressive pulmonary hygiene, IS, up to chair  Mobilize  PT/OT consult  Wean oxygen as tolerated O2 sats >88%   Severe Sepsis due to suspected Atypical Pneumonia CXR certainly with RLL pneumonia but query developing multifocal pneumonia. Procal 0.44. lactic peak at 4.8 and has cleared. UA without  infection. No GI symptoms.  No growth in BC to date P: Continue antibiotics as above Continue to follow blood/sputum cultures Continue to follow up with RVP full panel Continue aggressive pulm hygiene as above  Paroxysmal atrial fibrillation with RVR  Combined Systolic/Diastolic HF (echo 08/2019  50-55%) Essential HTN BNP 499. In afib but rate controlled currently. On coumadin at home. P: Restart coumadin  Continue metoprolol   Hyperlipidemia P:  Continue home atorvastatin 20mg  PO daily    CKD stage IV Anemia of Chronic Kidney Disease Hyperkalemia  Hypomagnesemia  Making urine. K 5.3 on admit. Starting lokelma  K trending down -Follows with Dr. Wolfgang Phoenix, appears patient is planning to start dialysis soon (possibly peritoneal) P: Receiving a total of 3 doses of lokelma x3 doses  Continue sodium bicarb tablets TID Continue to follow up with and trend electrolytes Daily BMETs, Mag, phos  Strict I/O, monitor urine output   At risk for protein calorie malnutrition  Did have OG output, bile with LIS Patient extubated without any issues P: Perform Bedside swallow exam  If passes, will add clear liquid diet  Consult RD to assist with nutritional requirements   Best Practice (right click and "Reselect all SmartList Selections" daily)   Diet/type: NPO DVT prophylaxis Coumadin Pressure ulcer(s): None GI prophylaxis: H2B Lines: N/A Foley:  Yes, and it is still needed Code Status:  full code Family update Patient updated at beside 1/31    CC: 45 mins  Christian Fanchon Papania AGACNP-BC   [facility] 06/03/2023, 12:22 PM  Please see [email or web address] for pager details.  From 7A-7P if no response, please call [phone]. After hours, please call ELink [phone].

## 2023-06-03 NOTE — Progress Notes (Deleted)
NAME:  [name] [name], MRN:  [id number], DOB:  [birth year], LOS: 2 ADMISSION DATE:  06/01/2023, CONSULTATION DATE:  06/02/2023 REFERRING MD:  Dr. Arville Care CHIEF COMPLAINT:  Shortness of Breath   History of Present Illness:  [name] [name] is a 73 y.o. male with a PMH significant for HTN, HLD, AAA s/p repair, MI, CAD, Combined systolic/diastolic HF, A-Fib on coumadin, Stroke, PVD, COPD, Tobacco use, CKD stage IV, GERD, who presented to [facility] ER 1/29 with acute onset worsening dyspnea, associated dry cough, and wheezing.  Per chart review, he denied fever, chills, nausea, vomiting. Upon arrival he was in tachycardic, tachypneic, BP 174/64, and placed on Bipap. Labs revealed ABG PH 7.41, HCO3 17.1, Lacitc 4.8->4.6, INR 3.5, RVP negative, BUN 47, creat 4.4, Hgb 9.2. EKG with Afib RVR rate 119. A chest xray was obtained showing mild cardiomegaly, mild edema, and possible atypical pneumonia. Patient was started on Ceftriaxone and Zithromax, given 125 mg Solu-Medrol and 2.5 liter IVF bolus. He was planned to be admitted to the step down unit, however, with worsening hypoxia patient was intubated 1/30 and transferred to [facility] ICU for further management.   Pertinent  Medical History  HTN/HLD Afib on Coumadin MI/CAD COPD Tobacco Use CKD stage IV  Significant Hospital Events: Including procedures, antibiotic start and stop dates in addition to other pertinent events   1/29: Acute respiratory on Bipap, requiring intubation. COPD exacerbation. Suspected PNA. Ceftriaxone/Zithromax/Steroids/Nebs 1/31 Weaned, extubated   Interim History / Subjective:  On vent this morning of 1/31, weaned and extubated   Objective   Blood pressure (!) 138/55, pulse 72, temperature 98.1 F (36.7 C), resp. rate 11, height 6' (1.829 m), weight 77.8 kg, SpO2 100%.    Vent Mode: PSV;CPAP FiO2 (%):  [40 %-50 %] 40 % Set Rate:  [14 bmp] 14 bmp Vt Set:  [620 mL] 620 mL PEEP:  [5 cmH20-8 cmH20] 5 cmH20 Pressure Support:  [5  cmH20] 5 cmH20 Plateau Pressure:  [13 cmH20-16 cmH20] 13 cmH20   Intake/Output Summary (Last 24 hours) at 06/03/2023 1012 Last data filed at 06/03/2023 0900 Gross per 24 hour  Intake 935.97 ml  Output 1530 ml  Net -594.03 ml   Filed Weights   06/01/23 1658 06/02/23 0500 06/03/23 0500  Weight: 78 kg 79.6 kg 77.8 kg   General: acute on chronic older adult male, following commands, pleasant HEENT: Normocephalic, pink MM, poor dentition- OG/ETT removed  Pulm: clear, diminished throughout lung fields, patient extubated with success-no distress CV: s1,s2, irregular, afib controlled, No JVD, No MRG Abs: Soft, Bs active Neuro: Alert, oriented, follows commands GU: foley, intact   Resolved Hospital Problem list   None  Assessment & Plan:  Acute Hypoxic Respiratory Failure due to COPD exacerbation and suspected PNA COPD with Acute Exacerbation Tobacco Use Intubated 1/30 for Hypoxia, although on review of vitals I do not see any documented hypoxia. Received CAP Coverage, steroids, nebs prior to arrival.  Negative for Covid, flu, RSV No growth in BC to date P: Full RVP still pending along with tracheal aspirate, follow upt  Continue rocephin and azithromycin for CAP coverage Continue Solumedrol 40mg  daily  Continue daily therapy of BD, continue duonebs as needed Pt extubated-continue aggressive pulmonary hygiene, IS, up to chair  Mobilize  PT/OT consult  Wean oxygen as tolerated O2 sats >88%   Severe Sepsis due to suspected Atypical Pneumonia CXR certainly with RLL pneumonia but query developing multifocal pneumonia. Procal 0.44. lactic peak at 4.8 and has cleared. UA without  infection. No GI symptoms.  No growth in BC to date P: Continue antibiotics as above Continue to follow blood/sputum cultures Continue to follow up with RVP full panel Continue aggressive pulm hygiene as above  Paroxysmal atrial fibrillation with RVR  Combined Systolic/Diastolic HF (echo 08/2019  50-55%) Essential HTN BNP 499. In afib but rate controlled currently. On coumadin at home. P: Restart coumadin  Continue metoprolol   Hyperlipidemia P:  Continue home atorvastatin 20mg  PO daily    CKD stage IV Anemia of Chronic Kidney Disease Hyperkalemia  Hypomagnesemia  Making urine. K 5.3 on admit. Starting lokelma  K trending down -Follows with Dr. Wolfgang Phoenix, appears patient is planning to start dialysis soon (possibly peritoneal) P: Receiving a total of 3 doses of lokelma x3 doses  Continue sodium bicarb tablets TID Continue to follow up with and trend electrolytes Daily BMETs, Mag, phos  Strict I/O, monitor urine output   At risk for protein calorie malnutrition  Did have OG output, bile with LIS Patient extubated without any issues P: Perform Bedside swallow exam  If passes, will add clear liquid diet  Consult RD to assist with nutritional requirements   Best Practice (right click and "Reselect all SmartList Selections" daily)   Diet/type: NPO DVT prophylaxis Coumadin Pressure ulcer(s): None GI prophylaxis: H2B Lines: N/A Foley:  Yes, and it is still needed Code Status:  full code Family update Patient updated at beside 1/31   CC: 40 mins   Christian Jeanette Rauth AGACNP-BC   [facility] 06/03/2023, 10:32 AM  Please see [email or web address] for pager details.  From 7A-7P if no response, please call [phone]. After hours, please call ELink [phone].

## 2023-06-04 DIAGNOSIS — J441 Chronic obstructive pulmonary disease with (acute) exacerbation: Secondary | ICD-10-CM | POA: Diagnosis not present

## 2023-06-04 LAB — CBC
HCT: 26.5 % — ABNORMAL LOW (ref 39.0–52.0)
Hemoglobin: 8.7 g/dL — ABNORMAL LOW (ref 13.0–17.0)
MCH: 31.1 pg (ref 26.0–34.0)
MCHC: 32.8 g/dL (ref 30.0–36.0)
MCV: 94.6 fL (ref 80.0–100.0)
Platelets: 132 10*3/uL — ABNORMAL LOW (ref 150–400)
RBC: 2.8 MIL/uL — ABNORMAL LOW (ref 4.22–5.81)
RDW: 15.4 % (ref 11.5–15.5)
WBC: 14.3 10*3/uL — ABNORMAL HIGH (ref 4.0–10.5)
nRBC: 0 % (ref 0.0–0.2)

## 2023-06-04 LAB — BASIC METABOLIC PANEL
Anion gap: 17 — ABNORMAL HIGH (ref 5–15)
BUN: 83 mg/dL — ABNORMAL HIGH (ref 8–23)
CO2: 17 mmol/L — ABNORMAL LOW (ref 22–32)
Calcium: 9.6 mg/dL (ref 8.9–10.3)
Chloride: 103 mmol/L (ref 98–111)
Creatinine, Ser: 5.43 mg/dL — ABNORMAL HIGH (ref 0.61–1.24)
GFR, Estimated: 11 mL/min — ABNORMAL LOW (ref >60)
Glucose, Bld: 122 mg/dL — ABNORMAL HIGH (ref 70–99)
Potassium: 4.7 mmol/L (ref 3.5–5.1)
Sodium: 137 mmol/L (ref 135–145)

## 2023-06-04 LAB — GLUCOSE, CAPILLARY
Glucose-Capillary: 122 mg/dL — ABNORMAL HIGH (ref 70–99)
Glucose-Capillary: 138 mg/dL — ABNORMAL HIGH (ref 70–99)

## 2023-06-04 LAB — RESPIRATORY PANEL BY PCR

## 2023-06-04 LAB — MAGNESIUM
Magnesium: 2.5 mg/dL — ABNORMAL HIGH (ref 1.7–2.4)
Magnesium: 2.4 mg/dL (ref 1.7–2.4)

## 2023-06-04 LAB — URINE CULTURE: Culture: NO GROWTH

## 2023-06-04 LAB — PROTIME-INR
INR: 2 — ABNORMAL HIGH (ref 0.8–1.2)
Prothrombin Time: 23.1 s — ABNORMAL HIGH (ref 11.4–15.2)

## 2023-06-04 LAB — PHOSPHORUS: Phosphorus: 5.2 mg/dL — ABNORMAL HIGH (ref 2.5–4.6)

## 2023-06-04 MED ORDER — TAMSULOSIN HCL 0.4 MG PO CAPS
0.4000 mg | ORAL_CAPSULE | Freq: Every day | ORAL | Status: DC
  Administered 2023-06-04 – 2023-06-09 (×6): 0.4 mg via ORAL
  Filled 2023-06-04 (×6): qty 1

## 2023-06-04 MED ORDER — SODIUM CHLORIDE 0.9 % IV SOLN: INTRAVENOUS | Status: AC

## 2023-06-04 MED ORDER — TRAZODONE HCL 50 MG PO TABS
25.0000 mg | ORAL_TABLET | Freq: Every evening | ORAL | Status: DC | PRN
  Administered 2023-06-04 – 2023-06-07 (×3): 50 mg via ORAL
  Filled 2023-06-04 (×4): qty 1

## 2023-06-04 NOTE — Progress Notes (Signed)
  Progress Note   Patient: [name] [name] EXB:[id number] DOB: March 05, 1951 DOA: 06/01/2023     3 DOS: the patient was seen and examined on 06/04/2023 at 10:24 AM      Brief hospital course: 73 y.o. M with smoking, COPD, CAD, AFib on warfarin, AAA s/p repair, sCHF recovered fromn 35->55%, and CKD IV baseline 4.4 who presented with AKI, COPD flare requiring intubation.  Now extubated and improving but still on new O2 and creatinine worsening.     Assessment and Plan: * COPD with acute exacerbation (HCC) Acute respiratory failure with hypoxia (HCC) Admitted 1/29 on BiPAP.  Respiratory status worsened and he was intubated.  Extubated 1/31, transferred out of unit.   -Continue Solu-Medrol - Continue bronchodilators - Continue Flonase - Continue Rocephin and azithromycin   Sepsis due to pneumonia (HCC) -Continue Rocephin and azithromycin, day 3 of 7. -Incentive spirometer and flutter valve  Acute kidney injury on CKD IV Hyperkalemia Cr 4.4 on admission, slowly trending up to 5.4 today. K resolved with fluids - Avoid hypotension and nephrotoxins - Continue Bicarb - Start gentle fluids and trend Cr - Will need Nephrology follow up - Hold Lasix for now  Paroxysmal atrial fibrillation with RVR (HCC) Heart rate normal - Continue warfarin, dosing per pharmacy - Continue metoprolol   Chronic systolic and diastolic congestive heart failure Dyslipidemia Essential hypertension Blood pressure slightly elevated - Continue amlodipine, metoprolol, hydralazine - Continue Lipitor - Hold hydralazine, nifedipine -Hold Lasix for creatinine worsening  Anemia of chronic kidney disease Hgb stable, no bleeidng observed  Hypomagnesemia Supplemented  Urinary retention Foley catheter in the ICU, now removed.  Having some retained urine (600 cc postvoid yesterday) - Resume home Flomax - Monitor urine output      Subjective: Patient is feeling overall better.  He is still on oxygen.   Has had some urinary retention overnight.  No fever, no confusion.     Physical Exam: BP (!) 181/66   Pulse 71   Temp 98.2 F (36.8 C) (Oral)   Resp 18   Ht 6' (1.829 m)   Wt 79.6 kg   SpO2 91%   BMI 23.79 kg/m   Elderly adult male, sitting up in bed, interactive and appropriate RRR, no murmurs, no peripheral edema On supplemental oxygen, appears short of breath, coarse wheezing bilaterally Abdomen soft without tenderness palpation or guarding, no ascites or distention Attention normal, affect normal, judgment and insight appear normal    Data Reviewed: Glucose normal INR 2 Creatinine 5.4 Discussed with nephrology Magnesium and phosphorus elevated Hemoglobin up to 8.7    Family Communication: None present    Disposition: Status is: Inpatient The patient was admitted with COPD exacerbation and worsening renal function  He was intubated for 2 days, now extubated continuing on steroids and bronchodilators  He can wean his oxygen and if his creatinine stabilizes, hopefully we can discharge in the next 2 to 3 days        Author: Alberteen Sam, MD 06/04/2023 3:18 PM  For on call review [email or web address].

## 2023-06-04 NOTE — Progress Notes (Signed)
PHARMACY - ANTICOAGULATION CONSULT NOTE  Pharmacy Consult for Eliquis Indication: atrial fibrillation  No Known Allergies  Patient Measurements: Height: 6' (182.9 cm) Weight: 79.6 kg (175 lb 6.4 oz) IBW/kg (Calculated) : 77.6 Heparin Dosing Weight: n/a  Vital Signs: Temp: 98.4 F (36.9 C) (02/01 0415) Temp Source: Oral (02/01 0415) BP: 166/81 (02/01 1116) Pulse Rate: 75 (02/01 1116)  Labs: Recent Labs    06/02/23 0451 06/02/23 0824 06/02/23 0958 06/03/23 0516 06/03/23 1040 06/03/23 1834 06/04/23 0337  HGB 8.8* 7.5*  --  8.1*  --   --  8.7*  HCT 28.1* 22.0*  --  25.5*  --   --  26.5*  PLT 129*  --   --  127*  --   --  132*  LABPROT 34.0*  --   --  31.6*  --   --  23.1*  INR 3.3*  --   --  3.0*  --   --  2.0*  CREATININE 4.52*  --    < > 5.08* 5.28* 5.43* 5.43*   < > = values in this interval not displayed.    Estimated Creatinine Clearance: 13.9 mL/min (A) (by C-G formula based on SCr of 5.43 mg/dL (H)).   Medical History: Past Medical History:  Diagnosis Date   AAA (abdominal aortic aneurysm) (HCC)    a. s/p repair 06/2015 with left renal artery bypass at that time, post-op course c/b renal failure requiring dialysis, C dif.   Anemia    Arteriosclerotic cardiovascular disease (ASCVD)    a. AMI in 2000 treated at [facility]. b. cath in 12/2006->  Chronic total obstruction of the RCA;  drug-eluting stent placed in OM1, LVEF abnormal.   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 Pt is on warfarin PTA. Previously Eliquis copay was not affordable. However, upon copay check this admission, Eliquis $0/mo. Therefore pharmacy consulted to switch anticoagulation from warfarin to Eliquis.  73 yo, 79 kg, Scr 4.52 today - does not meet 2/3 criteria for dose reduction.   2/1: INR 2.0 today. CBC ok  Goal of Therapy:  Monitor platelets by anticoagulation protocol: Yes   Plan:  No warfarin today Begin Eliquis when INR is < 2  -plan for Eliquis 5mg  BID when INR decreases below threshold F/u daily INR, s/sx bleeding, CBC  Verdene Rio, PharmD PGY1 Pharmacy Resident

## 2023-06-04 NOTE — Progress Notes (Signed)
Patient requested sleep aid and stated he takes a medication at home for sleep.  Trazodone on PTA med list.  Dr. Janalyn Shy notified with new orders.

## 2023-06-04 NOTE — Plan of Care (Signed)
  Problem: Fluid Volume: Goal: Hemodynamic stability will improve Outcome: Progressing   Problem: Clinical Measurements: Goal: Diagnostic test results will improve Outcome: Progressing Goal: Signs and symptoms of infection will decrease Outcome: Progressing   Problem: Respiratory: Goal: Ability to maintain adequate ventilation will improve Outcome: Progressing   Problem: Education: Goal: Knowledge of General Education information will improve Description: Including pain rating scale, medication(s)/side effects and non-pharmacologic comfort measures Outcome: Progressing   Problem: Health Behavior/Discharge Planning: Goal: Ability to manage health-related needs will improve Outcome: Progressing   Problem: Clinical Measurements: Goal: Ability to maintain clinical measurements within normal limits will improve Outcome: Progressing Goal: Will remain free from infection Outcome: Progressing Goal: Diagnostic test results will improve Outcome: Progressing Goal: Respiratory complications will improve Outcome: Progressing Goal: Cardiovascular complication will be avoided Outcome: Progressing   Problem: Activity: Goal: Risk for activity intolerance will decrease Outcome: Progressing   Problem: Nutrition: Goal: Adequate nutrition will be maintained Outcome: Progressing   Problem: Coping: Goal: Level of anxiety will decrease Outcome: Progressing   Problem: Elimination: Goal: Will not experience complications related to bowel motility Outcome: Progressing Goal: Will not experience complications related to urinary retention Outcome: Progressing   Problem: Pain Managment: Goal: General experience of comfort will improve and/or be controlled Outcome: Progressing   Problem: Safety: Goal: Ability to remain free from injury will improve Outcome: Progressing   Problem: Skin Integrity: Goal: Risk for impaired skin integrity will decrease Outcome: Progressing   Problem:  Safety: Goal: Non-violent Restraint(s) Outcome: Progressing

## 2023-06-04 DEATH — deceased

## 2023-06-05 DIAGNOSIS — J441 Chronic obstructive pulmonary disease with (acute) exacerbation: Secondary | ICD-10-CM | POA: Diagnosis not present

## 2023-06-05 LAB — BASIC METABOLIC PANEL
Anion gap: 16 — ABNORMAL HIGH (ref 5–15)
BUN: 96 mg/dL — ABNORMAL HIGH (ref 8–23)
CO2: 20 mmol/L — ABNORMAL LOW (ref 22–32)
Calcium: 9.8 mg/dL (ref 8.9–10.3)
Chloride: 106 mmol/L (ref 98–111)
Creatinine, Ser: 5.65 mg/dL — ABNORMAL HIGH (ref 0.61–1.24)
GFR, Estimated: 10 mL/min — ABNORMAL LOW (ref >60)
Glucose, Bld: 160 mg/dL — ABNORMAL HIGH (ref 70–99)
Potassium: 4.5 mmol/L (ref 3.5–5.1)
Sodium: 142 mmol/L (ref 135–145)

## 2023-06-05 LAB — PROTIME-INR
INR: 1.4 — ABNORMAL HIGH (ref 0.8–1.2)
Prothrombin Time: 17.1 s — ABNORMAL HIGH (ref 11.4–15.2)

## 2023-06-05 LAB — CBC
HCT: 27.3 % — ABNORMAL LOW (ref 39.0–52.0)
Hemoglobin: 8.9 g/dL — ABNORMAL LOW (ref 13.0–17.0)
MCH: 30.5 pg (ref 26.0–34.0)
MCHC: 32.6 g/dL (ref 30.0–36.0)
MCV: 93.5 fL (ref 80.0–100.0)
Platelets: 151 10*3/uL (ref 150–400)
RBC: 2.92 MIL/uL — ABNORMAL LOW (ref 4.22–5.81)
RDW: 15.3 % (ref 11.5–15.5)
WBC: 10.8 10*3/uL — ABNORMAL HIGH (ref 4.0–10.5)
nRBC: 0 % (ref 0.0–0.2)

## 2023-06-05 MED ORDER — APIXABAN 5 MG PO TABS
5.0000 mg | ORAL_TABLET | Freq: Two times a day (BID) | ORAL | Status: DC
  Administered 2023-06-05 – 2023-06-08 (×8): 5 mg via ORAL
  Filled 2023-06-05 (×8): qty 1

## 2023-06-05 MED ORDER — SODIUM CHLORIDE 0.9 % IV SOLN: INTRAVENOUS | Status: AC

## 2023-06-05 NOTE — Progress Notes (Addendum)
PHARMACY - ANTICOAGULATION CONSULT NOTE  Pharmacy Consult for Eliquis Indication: atrial fibrillation  No Known Allergies  Patient Measurements: Height: 6' (182.9 cm) Weight: 72.2 kg (159 lb 3.2 oz) IBW/kg (Calculated) : 77.6 Heparin Dosing Weight: n/a  Vital Signs: Temp: 98 F (36.7 C) (02/02 0808) Temp Source: Oral (02/02 0808) BP: 171/71 (02/02 1012) Pulse Rate: 76 (02/02 1012)  Labs: Recent Labs    06/03/23 0516 06/03/23 1040 06/03/23 1834 06/04/23 0337 06/05/23 0411 06/05/23 0920  HGB 8.1*  --   --  8.7*  --  8.9*  HCT 25.5*  --   --  26.5*  --  27.3*  PLT 127*  --   --  132*  --  151  LABPROT 31.6*  --   --  23.1* 17.1*  --   INR 3.0*  --   --  2.0* 1.4*  --   CREATININE 5.08*   < > 5.43* 5.43*  --  5.65*   < > = values in this interval not displayed.    Estimated Creatinine Clearance: 12.4 mL/min (A) (by C-G formula based on SCr of 5.65 mg/dL (H)).   Medical History: Past Medical History:  Diagnosis Date   AAA (abdominal aortic aneurysm) (HCC)    a. s/p repair 06/2015 with left renal artery bypass at that time, post-op course c/b renal failure requiring dialysis, C dif.   Anemia    Arteriosclerotic cardiovascular disease (ASCVD)    a. AMI in 2000 treated at [facility]. b. cath in 12/2006->  Chronic total obstruction of the RCA;  drug-eluting stent placed in OM1, LVEF abnormal.   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   PVD (peripheral vascular disease) (HCC)     Ct angiogram in 2009 revealed stable disease with 80% celiac stenosis,50% right renal artery ,ASCVD with ulceration in the abdominal aortashe   Testicular carcinoma (HCC) 1990   right orchiectomy   Tobacco abuse, in remission    20 pack years; quit in 2009    Assessment: 73 yo M with PMH afib. Pt is on warfarin PTA. Previously Eliquis copay was not affordable. However, upon copay check this admission, Eliquis $0/mo. Therefore pharmacy consulted to switch anticoagulation from warfarin to Eliquis.  73 yo, 79 kg, Scr 5.65 today - does not meet 2/3 criteria for dose reduction.   INR 1.4 today, Will transition to eliquis today. Hgb 8s, Pltc 150s.  Goal of Therapy:  Monitor platelets by anticoagulation protocol: Yes   Plan:  Start Eliquis 5mg  PO BID  F/u daily INR, s/sx bleeding, CBC  Verdene Rio, PharmD PGY1 Pharmacy Resident

## 2023-06-05 NOTE — Progress Notes (Signed)
Progress Note   Patient: [name] [name] YQM:[id number] DOB: 04-02-1951 DOA: 06/01/2023     4 DOS: the patient was seen and examined on 06/05/2023 at 10:24 AM      Brief hospital course: 73 y.o. M with smoking, COPD, CAD, AFib on warfarin, AAA s/p repair, sCHF recovered fromn 35->55%, and CKD IV baseline 4.4 who presented with AKI, COPD flare requiring intubation. Patient was in ICU.  Currently extubated. Creatinine elevated.  Overall improving but is still he still significantly short of breath.   Assessment and Plan: * COPD with acute exacerbation (HCC) Acute respiratory failure with hypoxia (HCC) Admitted 1/29 on BiPAP.  Respiratory status worsened and he was intubated.  Extubated 1/31, transferred out of unit.   -Continue Solu-Medrol as he is still symptomatic. - Continue bronchodilators - Continue Flonase - Continue Rocephin and azithromycin, will complete 5 days of therapy. Wean off to room air.  Check ambulatory oxygen as he may go home with oxygen. Respiratory virus panel was negative. Continue bronchodilator therapy, respiratory physical therapy, incentive spirometry.  May need to go home with oxygen.  Sepsis due to pneumonia (HCC) -Continue Rocephin and azithromycin, day 4 of 7.  Can change to oral on discharge. -Incentive spirometer and flutter valve  Acute kidney injury on CKD IV Hyperkalemia Cr 4.4 on admission, slowly trending up. K resolved with fluids. - Avoid hypotension and nephrotoxins - Continue Bicarb - Start gentle fluids and trend Cr - Hold Lasix for now -Urine output is 1500 mL last 24 hours.  Currently no evidence of uremia.  Will recheck renal functions tomorrow.  Patient follows up with Dr. Wolfgang Phoenix at [place].  If shortness of breath does not improve, will involve nephrology for dialysis planning.  Paroxysmal atrial fibrillation with RVR (HCC) Heart rate normal - Continue warfarin, dosing per pharmacy - Continue metoprolol   Chronic systolic  and diastolic congestive heart failure Dyslipidemia Essential hypertension Blood pressure stable today. - Continue amlodipine, metoprolol, hydralazine - Continue Lipitor - Hold hydralazine, nifedipine -Hold Lasix for creatinine worsening  Anemia of chronic kidney disease Hgb stable, no bleeidng observed  Hypomagnesemia Supplemented and adequate.  Urinary retention Foley catheter in the ICU, now removed.  Able to urinate without difficulty today. - Resume home Flomax - Monitor urine output      Subjective: Patient seen and examined.  Patient tells me his breathing is better but he still cannot go to the bathroom without getting out of breath.  Patient tells me he is known to have kidney issues and may be soon heading to dialysis. He has colonoscopy planned for tomorrow that he will reschedule.      Physical Exam: BP (!) 171/71   Pulse 76   Temp 98 F (36.7 C) (Oral)   Resp 18   Ht 6' (1.829 m)   Wt 72.2 kg   SpO2 94%   BMI 21.59 kg/m    General: Looks fairly stable at rest.  Dyspneic on mobility. Alert awake and oriented.  Frail. Cardiovascular: S1-S2 normal.  Regular rate rhythm. Respiratory: Bilateral poor air entry.  Poor inspiratory effort.  No added sounds. Gastrointestinal: Soft.  Nontender.  Bowel sound present. Ext: No edema or cyanosis.     Data Reviewed: BUN is 96 and creatinine 5.65 INR 1.4, pharmacy to redose Coumadin.   Family Communication: None present    Disposition: Status is: Inpatient The patient was admitted with COPD exacerbation and worsening renal function  He was intubated for 2 days, now extubated continuing on  steroids and bronchodilators  He can wean his oxygen and if his creatinine stabilizes, may go home with supplemental oxygen.        Author: Dorcas Carrow, MD 06/05/2023 11:35 AM  For on call review [email or web address].

## 2023-06-06 ENCOUNTER — Inpatient Hospital Stay: Payer: 59

## 2023-06-06 ENCOUNTER — Inpatient Hospital Stay (HOSPITAL_COMMUNITY): Payer: 59

## 2023-06-06 DIAGNOSIS — J441 Chronic obstructive pulmonary disease with (acute) exacerbation: Secondary | ICD-10-CM | POA: Diagnosis not present

## 2023-06-06 LAB — BASIC METABOLIC PANEL
Anion gap: 16 — ABNORMAL HIGH (ref 5–15)
BUN: 102 mg/dL — ABNORMAL HIGH (ref 8–23)
CO2: 18 mmol/L — ABNORMAL LOW (ref 22–32)
Calcium: 10.2 mg/dL (ref 8.9–10.3)
Chloride: 112 mmol/L — ABNORMAL HIGH (ref 98–111)
Creatinine, Ser: 5.59 mg/dL — ABNORMAL HIGH (ref 0.61–1.24)
GFR, Estimated: 10 mL/min — ABNORMAL LOW (ref >60)
Glucose, Bld: 147 mg/dL — ABNORMAL HIGH (ref 70–99)
Potassium: 4.8 mmol/L (ref 3.5–5.1)
Sodium: 146 mmol/L — ABNORMAL HIGH (ref 135–145)

## 2023-06-06 LAB — CBC
HCT: 26.5 % — ABNORMAL LOW (ref 39.0–52.0)
Hemoglobin: 8.5 g/dL — ABNORMAL LOW (ref 13.0–17.0)
MCH: 30.7 pg (ref 26.0–34.0)
MCHC: 32.1 g/dL (ref 30.0–36.0)
MCV: 95.7 fL (ref 80.0–100.0)
Platelets: 140 10*3/uL — ABNORMAL LOW (ref 150–400)
RBC: 2.77 MIL/uL — ABNORMAL LOW (ref 4.22–5.81)
RDW: 15.8 % — ABNORMAL HIGH (ref 11.5–15.5)
WBC: 11.3 10*3/uL — ABNORMAL HIGH (ref 4.0–10.5)
nRBC: 0.3 % — ABNORMAL HIGH (ref 0.0–0.2)

## 2023-06-06 LAB — CULTURE, BLOOD (ROUTINE X 2)
Culture: NO GROWTH
Culture: NO GROWTH
Special Requests: ADEQUATE
Special Requests: ADEQUATE

## 2023-06-06 LAB — URINALYSIS, ROUTINE W REFLEX MICROSCOPIC
Bacteria, UA: NONE SEEN
Bilirubin Urine: NEGATIVE
Glucose, UA: 50 mg/dL — AB
Hgb urine dipstick: NEGATIVE
Ketones, ur: NEGATIVE mg/dL
Leukocytes,Ua: NEGATIVE
Nitrite: NEGATIVE
Protein, ur: 100 mg/dL — AB
Specific Gravity, Urine: 1.015 (ref 1.005–1.030)
pH: 6 (ref 5.0–8.0)

## 2023-06-06 LAB — PROTIME-INR
INR: 1.6 — ABNORMAL HIGH (ref 0.8–1.2)
Prothrombin Time: 19.1 s — ABNORMAL HIGH (ref 11.4–15.2)

## 2023-06-06 LAB — CREATININE, URINE, RANDOM: Creatinine, Urine: 83 mg/dL

## 2023-06-06 LAB — SODIUM, URINE, RANDOM: Sodium, Ur: 46 mmol/L

## 2023-06-06 MED ORDER — IPRATROPIUM-ALBUTEROL 0.5-2.5 (3) MG/3ML IN SOLN
3.0000 mL | RESPIRATORY_TRACT | Status: AC
  Administered 2023-06-06: 3 mL via RESPIRATORY_TRACT

## 2023-06-06 MED ORDER — DEXTROSE 5 % IV SOLN
INTRAVENOUS | Status: AC
Start: 2023-06-06 — End: 2023-06-06

## 2023-06-06 MED ORDER — AMLODIPINE BESYLATE 10 MG PO TABS: 10.0000 mg | ORAL_TABLET | Freq: Every day | ORAL | Status: DC

## 2023-06-06 MED ORDER — HYDRALAZINE HCL 50 MG PO TABS: 100.0000 mg | ORAL_TABLET | Freq: Three times a day (TID) | ORAL | Status: DC

## 2023-06-06 MED ORDER — HYDRALAZINE HCL 20 MG/ML IJ SOLN
10.0000 mg | Freq: Four times a day (QID) | INTRAMUSCULAR | Status: DC | PRN
  Administered 2023-06-06 – 2023-06-11 (×2): 10 mg via INTRAVENOUS
  Filled 2023-06-06 (×2): qty 1

## 2023-06-06 MED ORDER — ALBUMIN HUMAN 25 % IV SOLN
50.0000 g | Freq: Once | INTRAVENOUS | Status: AC
  Administered 2023-06-06: 12.5 g via INTRAVENOUS
  Filled 2023-06-06: qty 200

## 2023-06-06 NOTE — Plan of Care (Signed)
 Will continue to monitor patient.

## 2023-06-06 NOTE — Progress Notes (Signed)
Physical Therapy Treatment Patient Details Name: [name] [name] MRN: [id number] DOB: 1950/06/02 Today's Date: 06/06/2023   History of Present Illness 73 yo male admitted 1/29 with SOB, work up revealed afib with RVR, sepsis due to PNA, and COPD exacerbation. Intubated 1/30 due to respiratory failure, extubated 1/31. PMH includes: HTN, HLD, AAA s/p repair, MI, CAD, combined systolic and diastolic HF, Afib on coumadin, PVD, COPD, CKD IV, and smoker.    PT Comments  The pt was agreeable to session, eager to mobilize. He reports feeling better, SpO2 stable on RA upon my arrival. He continues to demo poor endurance, needing seated rest after ~55 ft ambulation, but SpO2 91-95% on RA throughout. He was encouraged to use his rollator with seated rest and PLB to recover when at home. Pt verbalizes understanding, left with OT to further cover energy conservation.     If plan is discharge home, recommend the following: A little help with walking and/or transfers;A little help with bathing/dressing/bathroom;Assistance with cooking/housework;Help with stairs or ramp for entrance   Can travel by private vehicle        Equipment Recommendations  None recommended by PT (pt has rollator)    Recommendations for Other Services       Precautions / Restrictions Precautions Precautions: Fall Precaution Comments: watch 02 Restrictions Weight Bearing Restrictions Per Provider Order: No     Mobility  Bed Mobility Overal bed mobility: Modified Independent             General bed mobility comments: sitting EOB upon arrival    Transfers Overall transfer level: Needs assistance Equipment used: 1 person hand held assist, Rollator (4 wheels) Transfers: Sit to/from Stand Sit to Stand: Contact guard assist, Supervision           General transfer comment: CGA without UE support, supervision with rollator    Ambulation/Gait Ambulation/Gait assistance: Contact guard assist Gait Distance (Feet):  55 Feet (+ 22ft) Assistive device: Rollator (4 wheels) Gait Pattern/deviations: Step-through pattern, Decreased stride length, Trunk flexed Gait velocity: decreased Gait velocity interpretation: <1.31 ft/sec, indicative of household ambulator   General Gait Details: pt with  improved stability using rollator, needing seated rest after 55 ft, SpO2 94% on RA     Balance Overall balance assessment: Needs assistance Sitting-balance support: Bilateral upper extremity supported, Feet supported Sitting balance-Leahy Scale: Good     Standing balance support: Single extremity supported, No upper extremity supported, During functional activity Standing balance-Leahy Scale: Fair Standing balance comment: can ambulate without UE support, BUE support for energy conservation                            Cognition Arousal: Alert Behavior During Therapy: WFL for tasks assessed/performed Overall Cognitive Status: Within Functional Limits for tasks assessed                                             General Comments General comments (skin integrity, edema, etc.): Pt with low of 92% with good pleth on RA with activity      Pertinent Vitals/Pain Pain Assessment Pain Assessment: No/denies pain     PT Goals (current goals can now be found in the care plan section) Acute Rehab PT Goals Patient Stated Goal: back home PT Goal Formulation: With patient Time For Goal Achievement: 06/16/23 Potential to Achieve Goals: Good  Progress towards PT goals: Progressing toward goals    Frequency    Min 1X/week       AM-PAC PT "6 Clicks" Mobility   Outcome Measure  Help needed turning from your back to your side while in a flat bed without using bedrails?: A Little Help needed moving from lying on your back to sitting on the side of a flat bed without using bedrails?: A Little Help needed moving to and from a bed to a chair (including a wheelchair)?: A Little Help  needed standing up from a chair using your arms (e.g., wheelchair or bedside chair)?: A Little Help needed to walk in hospital room?: A Little Help needed climbing 3-5 steps with a railing? : A Little 6 Click Score: 18    End of Session Equipment Utilized During Treatment: Gait belt Activity Tolerance: Patient tolerated treatment well;Patient limited by fatigue Patient left: in bed (sitting with OT) Nurse Communication: Mobility status PT Visit Diagnosis: Other abnormalities of gait and mobility (R26.89);History of falling (Z91.81);Difficulty in walking, not elsewhere classified (R26.2)     Time: 9242-6834 PT Time Calculation (min) (ACUTE ONLY): 24 min  Charges:    $Therapeutic Exercise: 8-22 mins $Therapeutic Activity: 8-22 mins PT General Charges $$ ACUTE PT VISIT: 1 Visit                     Vickki Muff, PT, DPT   Acute Rehabilitation Department Office [phone] Secure Chat Communication Preferred   [name] 06/06/2023, 12:11 PM

## 2023-06-06 NOTE — Consult Note (Addendum)
Reason for Consult: AKI on CKD IV Referring Physician: Dr. Geanie Cooley [name] [name] is an 73 y.o. male.  HPI: [name] [name] has a history of smoking, COPD, CAD, afib on warfarin, AAA s/p repair, sCHF with LVEF 35>55%, and CKD IV with baseline creatinine 4.4 who presented with AKI and COPD exacerbation requiring intubation. He has since been extubated, but creatinine has remained elevated. He had a catheter placed in the ICU which has now been removed due to ability to spontaneously void. He was trialed on gentle IVF and has continued to receive bicarb. Electrolytes have improved. He is not uremic. UOP 1.225 L in the last 24 hours, though he is net positive 2.284 L since admission. The nephrology team was consulted for worsening renal function.  He states he has been breathing better slowly. He still has to use a breathing treatment when he mobilizes. He does not feel like he is retaining much fluid, though he does take a fluid pill at home. He recounts discussing starting dialysis with his outpatient nephrologist, but he would like to avoid this if possible. He was placed on dialysis once before in 2017 s/p juxtarenal AAA and left renal artery bypass and did not like the experience.  Trend in Creatinine: Creat  Date/Time Value Ref Range Status  01/22/2020 03:01 PM 2.99 (H) 0.70 - 1.25 mg/dL Final    Comment:    For patients >10 years of age, the reference limit for Creatinine is approximately 13% higher for people identified as African-American. [name]   08/31/2019 02:31 PM 2.68 (H) 0.70 - 1.25 mg/dL Final    Comment:    For patients >54 years of age, the reference limit for Creatinine is approximately 13% higher for people identified as African-American. [name]   07/27/2017 10:45 AM 2.34 (H) 0.70 - 1.25 mg/dL Final    Comment:    For patients >56 years of age, the reference limit for Creatinine is approximately 13% higher for people identified as African-American. [name]   12/28/2016 10:17 AM 2.06 (H)  0.70 - 1.25 mg/dL Final    Comment:      For patients > or = 71 years of age: The upper reference limit for Creatinine is approximately 13% higher for people identified as African-American.     10/27/2016 08:21 AM 2.08 (H) 0.70 - 1.25 mg/dL Final    Comment:      For patients > or = 72 years of age: The upper reference limit for Creatinine is approximately 13% higher for people identified as African-American.     02/03/2016 01:04 PM 1.96 (H) 0.70 - 1.25 mg/dL Final    Comment:      For patients > or = 71 years of age: The upper reference limit for Creatinine is approximately 13% higher for people identified as African-American.     11/07/2014 08:04 AM 1.10 0.50 - 1.35 mg/dL Final  16/02/9603 54:09 AM 1.13 0.50 - 1.35 mg/dL Final  81/19/1478 29:56 AM 1.42 (H) 0.50 - 1.35 mg/dL Final  21/30/8657 84:69 AM 1.23 0.50 - 1.35 mg/dL Final  62/95/2841 32:44 PM 2.01 (H) 0.50 - 1.35 mg/dL Final  05/05/7251 66:44 PM 1.56 (H) 0.50 - 1.35 mg/dL Final  03/47/4259 56:38 AM 1.70 (H) 0.50 - 1.35 mg/dL Final   Creatinine, Ser  Date/Time Value Ref Range Status  06/06/2023 09:18 AM 5.59 (H) 0.61 - 1.24 mg/dL Final  75/64/3329 51:88 AM 5.65 (H) 0.61 - 1.24 mg/dL Final  41/66/0630 16:01 AM 5.43 (H) 0.61 - 1.24 mg/dL  Final  06/03/2023 06:34 PM 5.43 (H) 0.61 - 1.24 mg/dL Final  16/02/9603 54:09 AM 5.28 (H) 0.61 - 1.24 mg/dL Final  81/19/1478 29:56 AM 5.08 (H) 0.61 - 1.24 mg/dL Final  21/30/8657 84:69 PM 5.03 (H) 0.61 - 1.24 mg/dL Final  62/95/2841 32:44 PM 4.76 (H) 0.61 - 1.24 mg/dL Final  05/05/7251 66:44 AM 4.68 (H) 0.61 - 1.24 mg/dL Final  03/47/4259 56:38 AM 4.52 (H) 0.61 - 1.24 mg/dL Final  75/64/3329 51:88 PM 4.36 (H) 0.61 - 1.24 mg/dL Final  41/66/0630 16:01 AM 4.40 (H) 0.61 - 1.24 mg/dL Final  09/32/3557 32:20 AM 4.45 (H) 0.76 - 1.27 mg/dL Final  25/42/7062 37:62 AM 4.35 (H) 0.61 - 1.24 mg/dL Final  83/15/1761 60:73 AM 3.40 (H) 0.76 - 1.27 mg/dL Final  71/10/2692 85:46 AM 3.79 (H)  0.76 - 1.27 mg/dL Final  27/07/5007 38:18 PM 3.41 (H) 0.61 - 1.24 mg/dL Final  29/93/7169 67:89 PM 2.94 (H) 0.61 - 1.24 mg/dL Final  38/01/1750 02:58 AM 3.04 (H) 0.61 - 1.24 mg/dL Final  52/77/8242 35:36 AM 2.89 (H) 0.61 - 1.24 mg/dL Final  14/43/1540 08:67 PM 2.48 (H) 0.61 - 1.24 mg/dL Final  61/95/0932 67:12 AM 2.28 (H) 0.61 - 1.24 mg/dL Final  45/80/9983 38:25 AM 2.05 (H) 0.61 - 1.24 mg/dL Final  05/39/7673 41:93 AM 2.00 (H) 0.61 - 1.24 mg/dL Final  79/06/4095 35:32 PM 2.90 (H) 0.61 - 1.24 mg/dL Final  99/24/2683 41:96 AM 3.15 (H) 0.61 - 1.24 mg/dL Final  22/29/7989 21:19 AM 2.94 (H) 0.61 - 1.24 mg/dL Final  41/74/0814 48:18 AM 3.19 (H) 0.61 - 1.24 mg/dL Final  56/31/4970 26:37 AM 3.32 (H) 0.61 - 1.24 mg/dL Final  85/88/5027 74:12 AM 3.40 (H) 0.61 - 1.24 mg/dL Final  87/86/7672 09:47 AM 3.54 (H) 0.61 - 1.24 mg/dL Final  09/62/8366 29:47 AM 4.00 (H) 0.61 - 1.24 mg/dL Final  65/46/5035 46:56 AM 4.48 (H) 0.61 - 1.24 mg/dL Final  81/27/5170 01:74 AM 4.79 (H) 0.61 - 1.24 mg/dL Final  94/49/6759 16:38 AM 5.57 (H) 0.61 - 1.24 mg/dL Final  46/65/9935 70:17 AM 6.53 (H) 0.61 - 1.24 mg/dL Final  79/39/0300 92:33 AM 7.21 (H) 0.61 - 1.24 mg/dL Final  00/76/2263 33:54 AM 8.34 (H) 0.61 - 1.24 mg/dL Final  56/25/6389 37:34 AM 8.31 (H) 0.61 - 1.24 mg/dL Final  28/76/8115 72:62 AM 10.73 (H) 0.61 - 1.24 mg/dL Final  03/55/9741 63:84 AM 11.33 (H) 0.61 - 1.24 mg/dL Final  53/64/6803 21:22 AM 12.13 (H) 0.61 - 1.24 mg/dL Final  48/25/0037 04:88 AM 12.15 (H) 0.61 - 1.24 mg/dL Final  89/16/9450 38:88 AM 12.08 (H) 0.61 - 1.24 mg/dL Final  28/00/3491 79:15 AM 11.48 (H) 0.61 - 1.24 mg/dL Final  05/69/7948 01:65 AM 10.45 (H) 0.61 - 1.24 mg/dL Final  53/74/8270 78:67 AM 8.77 (H) 0.61 - 1.24 mg/dL Final  54/49/2010 07:12 AM 7.37 (H) 0.61 - 1.24 mg/dL Final  19/75/8832 54:98 AM 5.25 (H) 0.61 - 1.24 mg/dL Final  26/41/5830 94:07 AM 7.35 (H) 0.61 - 1.24 mg/dL Final  68/12/8108 31:59 AM 5.91 (H) 0.61 - 1.24 mg/dL  Final  45/85/9292 44:62 AM 8.04 (H) 0.61 - 1.24 mg/dL Final    PMH:   Past Medical History:  Diagnosis Date   AAA (abdominal aortic aneurysm) (HCC)    a. s/p repair 06/2015 with left renal artery bypass at that time, post-op course c/b renal failure requiring dialysis, C dif.   Anemia    Arteriosclerotic cardiovascular disease (ASCVD)    a. AMI  in 2000 treated at [facility]. b. cath in 12/2006->  Chronic total obstruction of the RCA;  drug-eluting stent placed in OM1, LVEF abnormal.   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   PVD (peripheral vascular disease) (HCC)    Ct angiogram in 2009 revealed stable disease with 80% celiac stenosis,50% right renal artery ,ASCVD with ulceration in the abdominal aortashe   Testicular carcinoma (HCC) 1990   right orchiectomy   Tobacco abuse, in remission    20 pack years; quit in 2009    PSH:   Past Surgical History:  Procedure Laterality Date   ABDOMINAL AORTIC ANEURYSM REPAIR N/A 06/09/2015   Procedure: ANEURYSM ABDOMINAL AORTIC REPAIR;  Surgeon: Sherren Kerns, MD;  Location: [facility] OR;  Service: Vascular;  Laterality: N/A;   AORTIC ENDARTERECETOMY N/A 06/09/2015   Procedure: AORTIC ENDARTERECETOMY;  Surgeon: Sherren Kerns, MD;  Location: [facility] OR;  Service: Vascular;  Laterality: N/A;   AORTIC/RENAL BYPASS Left 06/09/2015   Procedure: LEFT RENAL Artery BYPASS;  Surgeon: Sherren Kerns, MD;  Location: [facility] OR;  Service: Vascular;  Laterality: Left;    APPENDECTOMY  2004   CHOLECYSTECTOMY N/A 09/18/2018   Procedure: LAPAROSCOPIC CHOLECYSTECTOMY;  Surgeon: Franky Macho, MD;  Location: [facility] ORS;  Service: General;  Laterality: N/A;   COLONOSCOPY  06/17/2011   INCOMPLETE, PREP POOR. Procedure: COLONOSCOPY;  Surgeon: Corbin Ade, MD;  Location: [facility] ENDO SUITE;  Service: Endoscopy;  Laterality: N/A;  10:00   COLONOSCOPY  07/15/2011   WUJ:[id number] rectal and colon polyps   COLONOSCOPY N/A 05/22/2015   [id number] colonic and rectal polyps. tubular adenomas.repeat TCS 05/2018   ESOPHAGEAL DILATION N/A 05/22/2015   Procedure: ESOPHAGEAL DILATION;  Surgeon: Corbin Ade, MD;  Location: [facility] ENDO SUITE;  Service: Endoscopy;  Laterality: N/A;   ESOPHAGOGASTRODUODENOSCOPY  06/17/2011   severe erosive/ulcerative reflux esophagitis, soft noncritical stricture dilatied, small hh, antral erosion    ESOPHAGOGASTRODUODENOSCOPY N/A 05/22/2015   RMR: 2 cm HH otherwise normal. s/p empirical dilation.   INSERTION OF DIALYSIS CATHETER Left 06/26/2015   Procedure: INSERTION OF DIALYSIS CATHETER;  Surgeon: Chuck Hint, MD;  Location: [facility] OR;  Service: Vascular;  Laterality: Left;   PERIPHERAL VASCULAR CATHETERIZATION N/A 05/28/2015   Procedure: Abdominal Aortogram;  Surgeon: Nada Libman, MD;  Location: [facility] INVASIVE CV LAB;  Service: Cardiovascular;  Laterality: N/A;   testicular cancer  1990   right orchiectomy    Allergies: No Known Allergies  Medications:   Prior to Admission medications   Medication Sig Start Date End Date Taking? Authorizing Provider  acetaminophen (TYLENOL) 500 MG tablet Take 500 mg by mouth every 6 (six) hours as needed for mild pain (pain score 1-3) or moderate pain (pain score 4-6).   Yes [provider]  albuterol (PROVENTIL) (2.5 MG/3ML) 0.083% nebulizer solution USE 1 VIAL IN NEBULIZER EVERY 4 HOURS AS NEEDED FOR WHEEZING OR SHORTNESS OF BREATH Patient taking differently: Take 2.5 mg by nebulization every 4 (four)  hours as needed for wheezing or shortness of breath. USE 1 VIAL IN NEBULIZER EVERY 4 HOURS AS NEEDED FOR WHEEZING OR SHORTNESS OF BREATH 11/24/21  Yes Anabel Halon, MD  albuterol (VENTOLIN HFA) 108 (90 Base) MCG/ACT inhaler INHALE 1 TO 2 PUFFS BY MOUTH EVERY 6 HOURS AS NEEDED FOR WHEEZING OR SHORTNESS OF BREATH Patient taking differently: Inhale 1-2 puffs into the lungs every 6 (six) hours as needed for wheezing or shortness of breath. 04/18/23  Yes Anabel Halon, MD  amLODipine (NORVASC) 10 MG tablet Take 1 tablet (10 mg total) by mouth daily. Patient taking differently: Take 10 mg by mouth in the morning. 10/12/22  Yes Anabel Halon, MD  atorvastatin (LIPITOR) 20 MG tablet Take 1 tablet by mouth once daily Patient taking differently: Take 20 mg by mouth in the morning. 05/12/23  Yes Anabel Halon, MD  calcitRIOL (ROCALTROL) 0.25 MCG capsule Take 1 capsule (0.25 mcg total) by mouth daily. Patient taking differently: Take 0.25 mcg by mouth in the morning. 03/25/23  Yes Anabel Halon, MD  fluticasone (FLONASE) 50 MCG/ACT nasal spray Place 1 spray into both nostrils daily as needed for allergies or rhinitis.   Yes [provider]  Fluticasone-Umeclidin-Vilant (TRELEGY ELLIPTA) 100-62.5-25 MCG/ACT AEPB Inhale 1 puff into the lungs daily. 02/01/23  Yes Anabel Halon, MD  furosemide (LASIX) 40 MG tablet TAKE 1/2 TO 1 (ONE-HALF TO ONE) TABLET BY MOUTH ONCE DAILY ALTERNATING  BETWEEN  1/2  ONE  DAY  AND  1  TABLET  THE  NEXT Patient taking differently: Take 20-40 mg by mouth in the morning. Take 20mg  by mouth one day and then take 40mg  by mouth the next day. Continue to alternate daily. 04/11/23  Yes Branch, Dorothe Pea, MD  hydrALAZINE (APRESOLINE) 100 MG tablet TAKE 1 TABLET BY MOUTH THREE TIMES DAILY Patient taking differently: Take 100-200 mg by mouth 2 (two) times daily. Take 200mg  by mouth every morning and then 100mg  by mouth in the evening. 10/22/22  Yes Branch, Dorothe Pea, MD   metoprolol succinate (TOPROL-XL) 100 MG 24 hr tablet TAKE 1.5 TABLETS BY MOUTH ONCE DAILY Patient taking differently: Take 150 mg by mouth in the morning. TAKE 1.5 TABLETS BY MOUTH ONCE DAILY 10/22/22  Yes Branch, Dorothe Pea, MD  pantoprazole (PROTONIX) 40 MG tablet Take 1 tablet (40 mg total) by mouth daily. Patient taking differently: Take 40 mg by mouth in the morning. 10/12/22  Yes Anabel Halon, MD  sodium bicarbonate 650 MG tablet Take 650 mg by mouth 3 (three) times daily. 04/15/23  Yes [provider]  tamsulosin (FLOMAX) 0.4 MG CAPS capsule Take 1 capsule (0.4 mg total) by mouth daily after supper. Patient taking differently: Take 0.4 mg by mouth at bedtime. 10/12/22  Yes Anabel Halon, MD  warfarin (COUMADIN) 5 MG tablet TAKE 1 TO 1 & 1/2 (ONE & ONE-HALF) TABLETS BY MOUTH ONCE DAILY AS DIRECTED BY THE COUMADIN CLINIC Patient taking differently: Take 5-7.5 mg by mouth at bedtime. Take 1 tablet by mouth everyday except Wednesday and Sunday. Take 1.5 tablets by mouth on Wednesday and Sunday. 03/01/23  Yes BranchDorothe Pea, MD  cholecalciferol (VITAMIN D3) 25 MCG (1000 UNIT) tablet Take 1,000 Units by mouth in the morning. Patient not taking: Reported on 06/02/2023    [provider]  NIFEdipine (ADALAT CC) 30 MG 24 hr tablet Take 30 mg by mouth daily. Patient not taking: Reported on 06/02/2023 05/30/23   [provider]  nitroGLYCERIN (NITROSTAT) 0.4 MG SL tablet place 1 tablet under the tongue if needed every 5 minutes for chest pain for 3 doses IF NO RELIEF AFTER 3RD DOSE CALL PRESCRIBER OR 911. Patient not taking: Reported on 06/02/2023 04/21/23   Anabel Halon, MD  traZODone (DESYREL) 50 MG tablet Take 0.5-1 tablets (25-50 mg total) by mouth at bedtime as needed for sleep. Patient not taking: Reported on 06/02/2023 04/21/23   Anabel Halon, MD    Inpatient medications:  amLODipine  10 mg Oral Daily   apixaban  5 mg Oral BID   arformoterol  15 mcg  Nebulization BID   atorvastatin  20 mg Oral Daily   azithromycin  500 mg Oral QPM   budesonide (PULMICORT) nebulizer solution  0.25 mg Nebulization BID   feeding supplement  237 mL Oral TID BM   hydrALAZINE  50 mg Oral Q8H   methylPREDNISolone (SOLU-MEDROL) injection  40 mg Intravenous Q24H   metoprolol tartrate  50 mg Oral BID   multivitamin with minerals  1 tablet Oral Daily   polyethylene glycol  17 g Oral Daily   revefenacin  175 mcg Nebulization Daily   sodium bicarbonate  650 mg Oral TID   tamsulosin  0.4 mg Oral Daily   thiamine  100 mg Oral Daily    Discontinued Meds:   Medications Discontinued During This Encounter  Medication Reason   furosemide (LASIX) tablet 40 mg    albuterol (VENTOLIN HFA) 108 (90 Base) MCG/ACT inhaler 2 puff Completed Course   amLODipine (NORVASC) tablet 10 mg Change in therapy   pantoprazole (PROTONIX) EC tablet 40 mg    ipratropium-albuterol (DUONEB) 0.5-2.5 (3) MG/3ML nebulizer solution 3 mL    hydrALAZINE (APRESOLINE) tablet 100 mg    metoprolol succinate (TOPROL-XL) 24 hr tablet 100 mg    NIFEdipine (PROCARDIA-XL/NIFEDICAL-XL) 24 hr tablet 30 mg    nitroGLYCERIN (NITROSTAT) SL tablet 0.4 mg    calcitRIOL (ROCALTROL) capsule 0.25 mcg    tamsulosin (FLOMAX) capsule 0.4 mg    guaiFENesin (MUCINEX) 12 hr tablet 600 mg    magnesium hydroxide (MILK OF MAGNESIA) suspension 30 mL    metoCLOPramide (REGLAN) injection 5 mg    morphine (PF) 2 MG/ML injection 2 mg    atorvastatin (LIPITOR) tablet 20 mg    sodium bicarbonate tablet 650 mg    furosemide (LASIX) injection 40 mg    chlorpheniramine-HYDROcodone (TUSSIONEX) 10-8 MG/5ML suspension 5 mL    lactated ringers infusion    famotidine (PEPCID) tablet 20 mg    methylPREDNISolone sodium succinate (SOLU-MEDROL) 40 mg/mL injection 40 mg    fluticasone furoate-vilanterol (BREO ELLIPTA) 100-25 MCG/ACT 1 puff    umeclidinium bromide (INCRUSE ELLIPTA) 62.5 MCG/ACT 1 puff    ipratropium-albuterol (DUONEB)  0.5-2.5 (3) MG/3ML nebulizer solution 3 mL    Warfarin - Pharmacist Dosing Inpatient    lactated ringers infusion    lactated ringers bolus 1,000 mL    lactated ringers bolus 500 mL    traZODone (DESYREL) tablet 25-50 mg    guaiFENesin (ROBITUSSIN) 100 MG/5ML liquid 600 mg    azithromycin (ZITHROMAX) 500 mg in sodium chloride 0.9 % 250 mL IVPB    azithromycin (ZITHROMAX) tablet 500 mg    fentaNYL in NS (26mcg/ml) infusion-PREMIX    propofol (DIPRIVAN) 1000 MG/100ML infusion    feeding supplement (OSMOLITE 1.5 CAL) liquid 1,000 mL    famotidine (PEPCID) tablet 20 mg    feeding supplement (PROSource TF20) liquid 60 mL  polyethylene glycol (MIRALAX / GLYCOLAX) packet 17 g    atorvastatin (LIPITOR) tablet 20 mg    metoprolol tartrate (LOPRESSOR) tablet 50 mg    sodium bicarbonate tablet 650 mg    multivitamin with minerals tablet 1 tablet    thiamine (VITAMIN B1) tablet 100 mg    azithromycin (ZITHROMAX) tablet 500 mg    methylPREDNISolone sodium succinate (SOLU-MEDROL) 40 mg/mL injection 40 mg    Oral care mouth rinse    insulin aspart (novoLOG) injection 0-9 Units    Chlorhexidine Gluconate Cloth 2 % PADS 6 each    Oral care mouth rinse    Oral care mouth rinse    amLODipine (NORVASC) tablet 10 mg    hydrALAZINE (APRESOLINE) tablet 100 mg     Social History:  reports that he has been smoking cigarettes. He started smoking about 41 years ago. He has a 20.7 pack-year smoking history. He has never used smokeless tobacco. He reports that he does not drink alcohol and does not use drugs.  Family History:   Family History  Problem Relation Age of Onset   Colon cancer Father 57       deceased   Prostate cancer Father    Heart disease Mother    Liver disease Neg Hx    Anesthesia problems Neg Hx    Hypotension Neg Hx    Malignant hyperthermia Neg Hx    Pseudochol deficiency Neg Hx     Pertinent items are noted in HPI. Weight change: 8.528 kg  Intake/Output  Summary (Last 24 hours) at 06/06/2023 1033 Last data filed at 06/06/2023 0500 Gross per 24 hour  Intake 1549.28 ml  Output 1225 ml  Net 324.28 ml   BP (!) 183/77 (BP Location: Right Arm)   Pulse 81   Temp 97.7 F (36.5 C) (Oral)   Resp 20   Ht 6' (1.829 m)   Wt 80.7 kg   SpO2 95%   BMI 24.14 kg/m  Vitals:   06/06/23 0058 06/06/23 0400 06/06/23 0805 06/06/23 0809  BP: (!) 171/75 (!) 164/71 (!) 183/77   Pulse: 81     Resp:  20 20   Temp:  97.6 F (36.4 C) 97.7 F (36.5 C)   TempSrc:  Oral Oral   SpO2: 94%   95%  Weight:  80.7 kg    Height:         General appearance: alert, cooperative, appears stated age, and no distress Resp: diminished breath sounds bilaterally and mild expiratory wheezes in superior posterior lung fields\ Cardio: irregularly irregular rhythm, S1, S2 normal, and no S3 or S4 GI: soft, non-tender; bowel sounds normal; no masses,  no organomegaly Extremities: atraumatic, no BLE edema, mild non-pitting edema of BLU extremities  Labs: Basic Metabolic Panel: Recent Labs  Lab 05/31/23 0947 06/01/23 1718 06/02/23 0451 06/02/23 0824 06/02/23 1804 06/02/23 2028 06/03/23 0516 06/03/23 1040 06/03/23 1641 06/03/23 1834 06/04/23 0337 06/05/23 0920 06/06/23 0918  NA 139   < > 138   < >  --  139 135 142  --  138 137 142 146*  K 4.4   < > 5.3*   < >  --  5.3* 4.9 4.7  --  4.4 4.7 4.5 4.8  CL 105   < > 108   < >  --  107 104 105  --  102 103 106 112*  CO2 20*   < > 19*   < >  --  19* 19* 20*  --  20* 17* 20* 18*  GLUCOSE 97   < > 180*   < >  --  112* 103* 95  --  136* 122* 160* 147*  BUN 47*   < > 45*   < >  --  56* 68* 72*  --  77* 83* 96* 102*  CREATININE 4.40*   < > 4.52*   < >  --  5.03* 5.08* 5.28*  --  5.43* 5.43* 5.65* 5.59*  ALBUMIN 3.5  --   --   --   --   --   --   --   --   --   --   --   --   CALCIUM 9.9   < > 9.7   < >  --  9.6 9.5 10.0  --  9.9 9.6 9.8 10.2  PHOS  --   --  4.9*  --  5.7*  --  6.7* 7.0* 6.1*  --  5.2*  --   --    < > = values in  this interval not displayed.   Liver Function Tests: Recent Labs  Lab 05/31/23 0947  AST 14*  ALT 13  ALKPHOS 59  BILITOT 0.6  PROT 7.3  ALBUMIN 3.5   No results for input(s): "LIPASE", "AMYLASE" in the last 168 hours. No results for input(s): "AMMONIA" in the last 168 hours. CBC: Recent Labs  Lab 05/31/23 0947 06/01/23 1718 06/03/23 0516 06/04/23 0337 06/05/23 0920 06/06/23 0918  WBC 6.0   < > 12.7* 14.3* 10.8* 11.3*  NEUTROABS 4.1  --   --   --   --   --   HGB 10.0*   < > 8.1* 8.7* 8.9* 8.5*  HCT 31.4*   < > 25.5* 26.5* 27.3* 26.5*  MCV 95.4   < > 96.2 94.6 93.5 95.7  PLT 170   < > 127* 132* 151 140*   < > = values in this interval not displayed.   PT/INR: @LABRCNTIP (inr:5) Cardiac Enzymes: )No results for input(s): "CKTOTAL", "CKMB", "CKMBINDEX", "TROPONINI" in the last 168 hours. CBG: Recent Labs  Lab 06/03/23 1114 06/03/23 1536 06/03/23 2029 06/04/23 0017 06/04/23 0416  GLUCAP 84 130* 135* 138* 122*    Iron Studies:  Recent Labs  Lab 05/31/23 0947  IRON 33*  TIBC 277  FERRITIN 122    Xrays/Other Studies: No results found.   Assessment/Plan: AKI on CKD IV: recent baseline Cr appears to be around 4.4. Has continued to rise over admission. UA with glucose 50, protein 100, amorphous crystals. Ucx negative. CXR on admission with bibasilar effusions. He received one dose IV lasix 40 mg at that time. Unlikely cardiorenal syndrome contributing to worsening renal function given lack of volume overload on exam. He had an episode of urinary retention of 600 cc that has since resolved with Flomax while Cr has continued to rise, making obstruction less likely. It does not appear he was profoundly hypotensive when he was intubated, making ATN less likely. Multiple nephrotoxic medications have been held over admission, so unlikely to be due to medication effects. To further evaluate, will obtain renal US, urine sodium, and urine creatinine. Will also give albumin to  increase intravascular volume given increased risk of insensible pulmonary losses. If not improved, may consider lasix. If continues to be refractory, consider initiation of dialysis in the hospital, as it appears there were discussions outpatient of initiation soon. Anemia of chronic disease: Hgb stable around 8.5. No need for EPO at this time. Can  consider if downtrending. Urinary retention: Now without foley due to spontaneous voids. UOP 1.2 L in last 24 hours with flomax. CTM. Hyperkalemia: normalized this morning. In setting of decreased renal function. Monitor AM. Hypomagnesemia: recent value normalized 06/04/23. In setting of decreased renal function. Monitor AM. COPD exacerbation, CHF, afib: per primary team.  Janeal Holmes 06/06/2023, 10:33 AM

## 2023-06-06 NOTE — Progress Notes (Signed)
Occupational Therapy Treatment Patient Details Name: [name] [name] MRN: [id number] DOB: [birth year] Today's Date: 06/06/2023   History of present illness 73 yo male admitted SOB afib with RVR and intubated PMH HTN HLD AAA s/p repair MI CAD combined systolic and diastolic HF, Afib on coumadin PVD COPD CKD IV smoker   OT comments  Pt at adequate level for d/c home from OT standpoint. Pt educated on energy conservation and handout present to help educate wife. Pt does express education to wife needed to ensure that they dont try to give him breathing treatments when he doesn't need them. He states this causes frustration and tension in the home. All education to wife to help with respiratory recovery is appreciated. Recommendation for HHOT .       If plan is discharge home, recommend the following:  A little help with walking and/or transfers;A little help with bathing/dressing/bathroom   Equipment Recommendations  None recommended by OT    Recommendations for Other Services      Precautions / Restrictions Precautions Precautions: Fall Precaution Comments: watch 02       Mobility Bed Mobility               General bed mobility comments: sitting eob on arrival    Transfers Overall transfer level: Needs assistance Equipment used: 1 person hand held assist Transfers: Sit to/from Stand Sit to Stand: Supervision           General transfer comment: transfer bed to bathroom to recliner     Balance Overall balance assessment: Needs assistance Sitting-balance support: No upper extremity supported, Feet supported Sitting balance-Leahy Scale: Good     Standing balance support: Single extremity supported, During functional activity Standing balance-Leahy Scale: Fair                             ADL either performed or assessed with clinical judgement   ADL Overall ADL's : Needs assistance/impaired     Grooming: Oral care;Wash/dry  hands;Supervision/safety;Standing               Lower Body Dressing: Contact guard assist;Sit to/from stand Lower Body Dressing Details (indicate cue type and reason): educated on dressing R LE first and figure 4 for energy conservation Toilet Transfer: Supervision/safety;Ambulation           Functional mobility during ADLs: Supervision/safety      Extremity/Trunk Assessment Upper Extremity Assessment Upper Extremity Assessment: Overall WFL for tasks assessed   Lower Extremity Assessment Lower Extremity Assessment: Overall WFL for tasks assessed        Vision   Vision Assessment?: No apparent visual deficits;Wears glasses for reading   Perception     Praxis      Cognition Arousal: Alert Behavior During Therapy: WFL for tasks assessed/performed Overall Cognitive Status: Within Functional Limits for tasks assessed                                          Exercises Exercises: Other exercises Other Exercises Other Exercises: educated on energy conservation and handout provided during session. pt with information of therapy contact if needed    Shoulder Instructions       General Comments RA 89% with DOE 3 out 4 noted. pt educated that smoking and oxgyen for d/c is not recommended. pt reports "i am going to try to  quit" pt educated that oxygen in his nose and smoking could cause burns and pt expressed understanding. pt reports "i am just so give out with everything"    Pertinent Vitals/ Pain       Pain Assessment Pain Assessment: No/denies pain  Home Living                                          Prior Functioning/Environment              Frequency  Min 1X/week        Progress Toward Goals  OT Goals(current goals can now be found in the care plan section)  Progress towards OT goals: Progressing toward goals  Acute Rehab OT Goals Patient Stated Goal: to go home tomorrow 06/07/23 OT Goal Formulation: With  patient Time For Goal Achievement: 06/17/23 Potential to Achieve Goals: Good ADL Goals Pt Will Perform Grooming: with modified independence;sitting Pt Will Perform Upper Body Bathing: with modified independence;sitting Pt Will Perform Lower Body Bathing: with modified independence;sit to/from stand;with adaptive equipment Pt Will Transfer to Toilet: with modified independence;ambulating;regular height toilet Additional ADL Goal #1: pt will complete adls in sitting with 2 energy conservation strategies  Plan      Co-evaluation                 AM-PAC OT "6 Clicks" Daily Activity     Outcome Measure   Help from another person eating meals?: None Help from another person taking care of personal grooming?: None Help from another person toileting, which includes using toliet, bedpan, or urinal?: A Little Help from another person bathing (including washing, rinsing, drying)?: A Little Help from another person to put on and taking off regular upper body clothing?: None Help from another person to put on and taking off regular lower body clothing?: A Little 6 Click Score: 21    End of Session    OT Visit Diagnosis: Unsteadiness on feet (R26.81);Muscle weakness (generalized) (M62.81)   Activity Tolerance Patient tolerated treatment well   Patient Left in chair;with call bell/phone within reach   Nurse Communication Mobility status;Precautions        Time: 1006 [phone] OT Time Calculation (min): 18 min  Charges: OT Treatments $Self Care/Home Management : 8-22 mins   Brynn, OTR/L  Acute Rehabilitation Services Office: [phone] .   Mateo Flow 06/06/2023, 12:00 PM

## 2023-06-06 NOTE — Progress Notes (Signed)
Progress Note   Patient: [name] [name] UJW:[id number] DOB: 1950/12/04 DOA: 06/01/2023     5 DOS: the patient was seen and examined on 06/06/2023 at 10:24 AM      Brief hospital course: 73 y.o. M with smoking, COPD, CAD, AFib on warfarin, AAA s/p repair, sCHF recovered fromn 35->55%, and CKD IV baseline 4.4 who presented with AKI, COPD flare requiring intubation. Patient was in ICU.  Currently extubated. Creatinine elevated.  Overall improving but is still he still significantly short of breath.   Assessment and Plan: * COPD with acute exacerbation (HCC) Acute respiratory failure with hypoxia (HCC) Admitted 1/29 on BiPAP.  Respiratory status worsened and he was intubated.  Extubated 1/31, transferred out of unit.   -Continue Solu-Medrol as he is still symptomatic. - Continue bronchodilators - Continue Flonase - Continue Rocephin and azithromycin, will complete 5 days of therapy. Wean off to room air.  Check ambulatory oxygen as he may go home with oxygen. Respiratory virus panel was negative. Continue bronchodilator therapy, respiratory physical therapy, incentive spirometry.    Sepsis due to pneumonia (HCC) -Continue Rocephin and azithromycin, day 5 of 5.   -Incentive spirometer and flutter valve  Acute kidney injury on CKD IV Hyperkalemia Cr 4.4 on admission, slowly trending up. K resolved with fluids. - Avoid hypotension and nephrotoxins - Continue Bicarb - Hold Lasix for now -Urine output is adequate.  Creatinine persistently elevated.  No evidence of fluid overload.  Potassium is normal.   Currently no evidence of uremia.  Patient follows up with Dr. Wolfgang [name] at [facility].  Shortness of breath persist but mostly due to pulmonary pathology. Given persistently elevated BUN/creatinine, will discuss case with nephrology.  Paroxysmal atrial fibrillation with RVR (HCC) Heart rate normal - On Coumadin at home.  Started on Eliquis.  Previously not taking Eliquis due to cost. -  Continue metoprolol   Chronic systolic and diastolic congestive heart failure Dyslipidemia Essential hypertension Blood pressure stable today. - Continue amlodipine, metoprolol, hydralazine - Continue Lipitor - Hold hydralazine, nifedipine -Hold Lasix for creatinine worsening  Anemia of chronic kidney disease Hgb stable, no bleeidng observed  Hypomagnesemia Supplemented and adequate.  Urinary retention Foley catheter in the ICU, now removed.  Able to urinate without difficulty today. -  Flomax - Monitor urine output      Subjective: Patient seen and examined.  No overnight events.  Feels fairly okay at rest but gets out of breath on minimal mobility.  Afebrile.      Physical Exam: BP (!) 154/76 (BP Location: Right Arm)   Pulse 85   Temp 97.9 F (36.6 C) (Oral)   Resp 18   Ht 6' (1.829 m)   Wt 80.7 kg   SpO2 90%   BMI 24.14 kg/m    General: Looks fairly stable at rest.  Dyspneic on mobility. Alert awake and oriented.  Frail and debilitated. Cardiovascular: S1-S2 normal.  Regular rate rhythm. Respiratory: Bilateral poor air entry.  Poor inspiratory effort.  No added sounds. Gastrointestinal: Soft.  Nontender.  Bowel sound present. Ext: No edema or cyanosis.     Data Reviewed: BUN is 102 and creatinine 5.59 INR 1.6, Coumadin discontinued.  On Eliquis now.   Family Communication: None present    Disposition: Status is: Inpatient The patient was admitted with COPD exacerbation and worsening renal function  He was intubated for 2 days, now extubated continuing on steroids and bronchodilators  He can wean his oxygen and if his creatinine stabilizes, may go home with  supplemental oxygen.        Author: Dorcas Carrow, MD 06/06/2023 12:28 PM  For on call review [email or web address].

## 2023-06-06 NOTE — TOC Initial Note (Signed)
Transition of Care [facility]) - Initial/Assessment Note    Patient Details  Name: [name] [name] MRN: [id number] Date of Birth: Dec 17, 1950  Transition of Care [facility]) CM/SW Contact:    Jessie Foot, RN Phone Number: 06/06/2023, 1:31 PM  Clinical Narrative:                 Risk for readmission completed.Patient presented for septicemia. PTA patient was from home with spouse Bonita Quin) . Patient states was independent, still drives and has DME (rollator, cane, shower chair and bedside commode). Patient will have transportation home via private vehicle. Case Manager will continue to follow for disposition needs as the patient progresses.       Patient Goals and CMS Choice Patient states their goals for this hospitalization and ongoing recovery are:: To return to home          Expected Discharge Plan and Services       Living arrangements for the past 2 months: Mobile Home                   DME Agency: NA                  Prior Living Arrangements/Services Living arrangements for the past 2 months: Mobile Home Lives with:: Spouse Patient language and need for interpreter reviewed:: Yes Do you feel safe going back to the place where you live?: Yes      Need for Family Participation in Patient Care: Yes (Comment) (Educate wife about when a breathing treatment may be needed.) Care giver support system in place?: Yes (comment) Current home services: DME Criminal Activity/Legal Involvement Pertinent to Current Situation/Hospitalization: No - Comment as needed  Activities of Daily Living   ADL Screening (condition at time of admission) Independently performs ADLs?: Yes (appropriate for developmental age) Is the patient deaf or have difficulty hearing?: No Does the patient have difficulty seeing, even when wearing glasses/contacts?: No Does the patient have difficulty concentrating, remembering, or making decisions?: No  Permission Sought/Granted Permission sought to share  information with : Case Manager, Family Supports Permission granted to share information with : Yes, Verbal Permission Granted  Share Information with NAME: Bonita Quin (Spouse)     Permission granted to share info w Relationship: Souse  Permission granted to share info w Contact Information: [phone]  Emotional Assessment Appearance:: Appears stated age Attitude/Demeanor/Rapport: Engaged Affect (typically observed): Pleasant, Appropriate Orientation: : Oriented to Self, Oriented to Place, Oriented to  Time, Oriented to Situation Alcohol / Substance Use: Not Applicable Psych Involvement: No (comment)  Admission diagnosis:  SOB (shortness of breath) [R06.02] Hypoxia [R09.02] Acute respiratory failure with hypoxia (HCC) [J96.01] Stage 5 chronic kidney disease not on chronic dialysis (HCC) [N18.5] Sepsis, due to unspecified organism, unspecified whether acute organ dysfunction present (HCC) [A41.9] Respiratory failure (HCC) [J96.90] Patient Active Problem List   Diagnosis Date Noted   Malnutrition of moderate degree 06/03/2023   Sepsis due to pneumonia (HCC) 06/02/2023   Dyslipidemia 06/02/2023   COPD with acute exacerbation (HCC) 06/02/2023   Paroxysmal atrial fibrillation with RVR (HCC) 06/02/2023   Respiratory failure (HCC) 06/02/2023   Acute respiratory failure with hypoxia (HCC) 06/01/2023   Anemia in chronic kidney disease 05/31/2023   Right flank mass 04/21/2023   Axillary mass, left 04/21/2023   Encounter for screening for lung cancer 10/15/2022   Acute bronchitis with COPD (HCC) 04/22/2021   BPH (benign prostatic hyperplasia) 02/17/2021   Encounter for general adult medical examination with abnormal findings 10/02/2020  IDA (iron deficiency anemia) 01/22/2020   Chronic combined systolic and diastolic CHF (congestive heart failure) (HCC) 08/20/2019   Paroxysmal A-fib (HCC) 08/20/2019   Prolonged QT interval 08/20/2019   COPD    Status post abdominal aortic aneurysm (AAA)  repair    History of CVA (cerebrovascular accident)    CAD - s/p PCI to Cx. 100% RCA CTO 05/28/2015   Hiatal hernia    Hx of adenomatous colonic polyps 05/14/2015   Constipation 04/22/2015   Insomnia 07/16/2014   ED (erectile dysfunction) 06/17/2013   ESRD (end stage renal disease) (HCC) 09/28/2011   GERD (gastroesophageal reflux disease) 06/01/2011   Family history of colon cancer in father 05/20/2011   Chronic anticoagulation 09/24/2010   Tobacco use    PERIPHERAL VASCULAR DISEASE 11/13/2009   Hyperlipidemia 10/24/2008   History of testicular cancer 10/22/2008   Essential hypertension 10/22/2008   PCP:  Anabel Halon, MD Pharmacy:   [facility] [address] [place] - [address] [place] [address] Phone: [phone] Fax: [phone]  [facility] [place], [place] - [address] [place] [address] Phone: [phone] Fax: [phone]     Social Drivers of Health (SDOH) Social History: SDOH Screenings   Food Insecurity: No Food Insecurity (06/04/2023)  Housing: Patient Unable To Answer (06/02/2023)  Transportation Needs: Patient Unable To Answer (06/02/2023)  Utilities: Patient Unable To Answer (06/02/2023)  Alcohol Screen: Low Risk  (02/16/2021)  Depression (PHQ2-9): Low Risk  (04/21/2023)  Financial Resource Strain: Low Risk  (04/20/2023)  Physical Activity: Insufficiently Active (04/20/2023)  Social Connections: Patient Unable To Answer (06/02/2023)  Stress: No Stress Concern Present (04/20/2023)  Tobacco Use: High Risk (06/01/2023)  Health Literacy: Adequate Health Literacy (04/20/2023)   SDOH Interventions:     Readmission Risk Interventions    06/06/2023    1:09 PM  Readmission Risk Prevention Plan  Transportation Screening Complete  HRI or Home Care Consult Complete  Social Work Consult for Recovery Care Planning/Counseling Complete  Palliative Care Screening Not Applicable  Medication Review  Oceanographer) Complete

## 2023-06-06 NOTE — Care Management Important Message (Signed)
Important Message  Patient Details  Name: [name] [name] MRN: [id number] Date of Birth: 16-Oct-1950   Important Message Given:  Yes - Medicare IM     Renie Ora 06/06/2023, 10:24 AM

## 2023-06-07 DIAGNOSIS — J441 Chronic obstructive pulmonary disease with (acute) exacerbation: Secondary | ICD-10-CM | POA: Diagnosis not present

## 2023-06-07 LAB — BASIC METABOLIC PANEL
Anion gap: 15 (ref 5–15)
BUN: 105 mg/dL — ABNORMAL HIGH (ref 8–23)
CO2: 18 mmol/L — ABNORMAL LOW (ref 22–32)
Calcium: 10 mg/dL (ref 8.9–10.3)
Chloride: 110 mmol/L (ref 98–111)
Creatinine, Ser: 5.63 mg/dL — ABNORMAL HIGH (ref 0.61–1.24)
GFR, Estimated: 10 mL/min — ABNORMAL LOW (ref >60)
Glucose, Bld: 115 mg/dL — ABNORMAL HIGH (ref 70–99)
Potassium: 5.2 mmol/L — ABNORMAL HIGH (ref 3.5–5.1)
Sodium: 143 mmol/L (ref 135–145)

## 2023-06-07 LAB — PROTIME-INR
INR: 1.9 — ABNORMAL HIGH (ref 0.8–1.2)
Prothrombin Time: 22.3 s — ABNORMAL HIGH (ref 11.4–15.2)

## 2023-06-07 LAB — CBC
HCT: 25.1 % — ABNORMAL LOW (ref 39.0–52.0)
Hemoglobin: 8 g/dL — ABNORMAL LOW (ref 13.0–17.0)
MCH: 30.4 pg (ref 26.0–34.0)
MCHC: 31.9 g/dL (ref 30.0–36.0)
MCV: 95.4 fL (ref 80.0–100.0)
Platelets: 134 10*3/uL — ABNORMAL LOW (ref 150–400)
RBC: 2.63 MIL/uL — ABNORMAL LOW (ref 4.22–5.81)
RDW: 15.7 % — ABNORMAL HIGH (ref 11.5–15.5)
WBC: 10.2 10*3/uL (ref 4.0–10.5)
nRBC: 0.3 % — ABNORMAL HIGH (ref 0.0–0.2)

## 2023-06-07 LAB — MAGNESIUM: Magnesium: 2.5 mg/dL — ABNORMAL HIGH (ref 1.7–2.4)

## 2023-06-07 MED ORDER — PREDNISONE 20 MG PO TABS
20.0000 mg | ORAL_TABLET | Freq: Every day | ORAL | Status: DC
  Administered 2023-06-07 – 2023-06-09 (×3): 20 mg via ORAL
  Filled 2023-06-07 (×3): qty 1

## 2023-06-07 MED ORDER — IRON SUCROSE 200 MG IVPB - SIMPLE MED
200.0000 mg | Status: AC
  Administered 2023-06-07 – 2023-06-09 (×3): 200 mg via INTRAVENOUS
  Filled 2023-06-07 (×3): qty 200

## 2023-06-07 NOTE — TOC Progression Note (Signed)
 Transition of Care [facility]) - Progression Note    Patient Details  Name: [name] [name] MRN: [id number] Date of Birth: 03/17/1951  Transition [facility]) CM/SW Contact  Doneta Glenys DASEN, RN Phone Number: 06/07/2023, 11:02 AM  Clinical Narrative:    Patient is no longer on oxygen . Patients states does not want or feels he needs PT at time and his wife is good with anything he needs. Inquired about when he will be discharged. Dr. Tobie (Nephrology) informed patient discharge will depend on kidney function over the next 24-48 hours.      Expected Discharge Plan and Services       Living arrangements for the past 2 months: Mobile Home                   DME Agency: NA                   Social Determinants of Health (SDOH) Interventions SDOH Screenings   Food Insecurity: No Food Insecurity (06/04/2023)  Housing: Patient Unable To Answer (06/02/2023)  Transportation Needs: Patient Unable To Answer (06/02/2023)  Utilities: Patient Unable To Answer (06/02/2023)  Alcohol  Screen: Low Risk  (02/16/2021)  Depression (PHQ2-9): Low Risk  (04/21/2023)  Financial Resource Strain: Low Risk  (04/20/2023)  Physical Activity: Insufficiently Active (04/20/2023)  Social Connections: Patient Unable To Answer (06/02/2023)  Stress: No Stress Concern Present (04/20/2023)  Tobacco Use: High Risk (06/01/2023)  Health Literacy: Adequate Health Literacy (04/20/2023)    Readmission Risk Interventions    06/06/2023    1:09 PM  Readmission Risk Prevention Plan  Transportation Screening Complete  HRI or Home Care Consult Complete  Social Work Consult for Recovery Care Planning/Counseling Complete  Palliative Care Screening Not Applicable  Medication Review Oceanographer) Complete

## 2023-06-07 NOTE — Plan of Care (Signed)
 Will continue to monitor patient.

## 2023-06-07 NOTE — Progress Notes (Signed)
 Mobility Specialist Progress Note:   06/07/23 1126  Mobility  Activity Ambulated with assistance in hallway  Level of Assistance Contact guard assist, steadying assist  Assistive Device Four wheel walker  Distance Ambulated (ft) 300 ft  Activity Response Tolerated well  Mobility Referral Yes  Mobility visit 1 Mobility  Mobility Specialist Start Time (ACUTE ONLY) [id number]  Mobility Specialist Stop Time (ACUTE ONLY) 0951  Mobility Specialist Time Calculation (min) (ACUTE ONLY) 13 min   Received pt in bed having no complaints and agreeable to mobility. Pt took x2 seated rest breaks d/t SOB. Ambulated on RA throughout session Spo2 stayed between 90%-93%. Returned to room w/o fault. Left seated EOB w/ call bell in reach and all needs met.   Thersia Minder Mobility Specialist  Please contact vis Secure Chat or  Rehab Office [phone]

## 2023-06-07 NOTE — Progress Notes (Signed)
  Progress Note   Patient: [name] [name] FMW:[id number] DOB: 05/08/1950 DOA: 06/01/2023     6 DOS: the patient was seen and examined on 06/07/2023 at 10:24 AM      Brief hospital course: 73 y.o. M with smoking, COPD, CAD, AFib on warfarin, AAA s/p repair, sCHF recovered fromn 35->55%, and CKD IV baseline 4.4 who presented with AKI, COPD flare requiring intubation. Patient was in ICU.  Currently extubated. His breathing is slightly improving, however patient has developed significantly elevated creatinine.  Nephrology involved.   Assessment and Plan: COPD with acute exacerbation (HCC) Acute respiratory failure with hypoxia (HCC) Admitted 1/29 on BiPAP.  Respiratory status worsened and he was intubated.  Extubated 1/31, transferred out of unit.   -Continue steroid.  Will slowly taper to prednisone . - Continue bronchodilators - Continue Flonase  - Completed 5 days of antibiotics. Wean off to room air.  Check ambulatory oxygen  as he may go home with oxygen . Respiratory virus panel was negative. Continue bronchodilator therapy, respiratory physical therapy, incentive spirometry.    Sepsis due to pneumonia (HCC) -Continue Rocephin  and azithromycin , completed antibiotics. -Incentive spirometer and flutter valve  Acute kidney injury on CKD IV Hyperkalemia Cr 4.4 on admission, slowly trending up. K resolved with fluids. - Avoid hypotension and nephrotoxins - Continue Bicarb - Hold Lasix  for now -Urine output is adequate.  Creatinine persistently elevated.  No evidence of fluid overload. Currently no evidence of uremia.  Patient follows up with Dr. Rachele at [place].  With persistently elevated BUN/creatinine, nephrology is consulted.  Following.  Treated with fluid and albumin .  Will need to stabilize renal functions before discharge.  Paroxysmal atrial fibrillation with RVR (HCC) Heart rate normal - On Coumadin  at home.  Started on Eliquis .  Previously not taking Eliquis  due to  cost. - Continue metoprolol    Chronic systolic and diastolic congestive heart failure Dyslipidemia Essential hypertension Blood pressure stable today. - Continue amlodipine , metoprolol , hydralazine  - Continue Lipitor  - Hold hydralazine , nifedipine  -Hold Lasix  for creatinine worsening  Anemia of chronic kidney disease Hgb stable, no bleeidng observed  Hypomagnesemia Supplemented and adequate.  Urinary retention Foley catheter in the ICU, now removed.  Able to urinate without difficulty today. -  Flomax  - Monitor urine output      Subjective: Patient seen and examined.  Exertional dyspnea persist.  Able to ambulate around and not needing supplemental oxygen .  Gets short of breath.   Physical Exam: BP (!) 163/75 (BP Location: Right Arm)   Pulse 89   Temp 97.9 F (36.6 C) (Oral)   Resp (!) 23   Ht 6' (1.829 m)   Wt 80.8 kg   SpO2 98%   BMI 24.15 kg/m    General: Comfortable at rest.  Dyspneic with mobility. Alert awake and oriented.  Frail and debilitated. Cardiovascular: S1-S2 normal.  Regular rate rhythm. Respiratory: Bilateral poor air entry.  Poor inspiratory effort.  No added sounds. Gastrointestinal: Soft.  Nontender.  Bowel sound present. Ext: No edema or cyanosis.     Data Reviewed: BUN is 105 and creatinine 5.63    Family Communication: None present    Disposition: Status is: Inpatient The patient was admitted with COPD exacerbation and worsening renal function. Discharge home when renal functions stabilized.         Author: Renato Applebaum, MD 06/07/2023 12:49 PM  For on call review [email or web address].

## 2023-06-07 NOTE — Progress Notes (Signed)
 Patient ID: [name] [name], male   DOB: Apr 13, 1951, 73 y.o.   MRN: [id number] [facility] Progress Note   Assessment/ Plan:   1. Acute kidney Injury on chronic kidney disease stage IV: Nonoliguric and appears to be hemodynamically mediated in the setting of COPD exacerbation with increased insensible losses.  Urine electrolytes without clear evidence of intravascular volume contraction to justify need for intravenous fluids and physical exam without indications for additional diuretics at this time.  Worsening azotemia/rising BUN as a result of ongoing corticosteroid use (which I recommend weaning as rapidly as possible without jeopardizing COPD management).  Will continue to follow the patient's labs for surveillance of renal function-not stable to discharge home from renal standpoint. 2.  Hypernatremia: Secondary to dehydration from increased insensible losses, improved with D5 water  overnight.  Encouraged oral intake of fluids/water . 3.  Hyperkalemia: Mild, will treat with single dose of Lokelma . 4.  Acute exacerbation of COPD: With acute hypoxic respiratory failure.  Ongoing treatment with bronchodilators and corticosteroids as well as empiric antimicrobial coverage for CAP. 5.  Anemia: Likely secondary to chronic kidney disease.  No overt blood loss noted.  With significant iron  deficiency seen on earlier labs, will give intravenous iron .  Subjective:   Still having shortness of breath even with mild activity.  Has been weaned off of supplemental oxygen  with decent oxygen  saturations.  Inquires about going home.   Objective:   BP (!) 150/84   Pulse 73   Temp 97.9 F (36.6 C) (Oral)   Resp (!) 23   Ht 6' (1.829 m)   Wt 80.8 kg   SpO2 98%   BMI 24.15 kg/m   Intake/Output Summary (Last 24 hours) at 06/07/2023 1052 Last data filed at 06/07/2023 0935 Gross per 24 hour  Intake 1199.85 ml  Output 800 ml  Net 399.85 ml   Weight change: 0.045 kg  Physical Exam: Gen:  Comfortably sitting in bed however, appears dyspneic on conversation CVS: Pulse regular rhythm, normal rate, S1 and S2 normal Resp: Distant breath sounds bilaterally, no distinct rales or rhonchi Abd: Soft, flat, nontender, bowel sounds normal Ext: Trace lower extremity edema  Imaging: US  RENAL Result Date: 06/06/2023 CLINICAL DATA:  Acute kidney injury EXAM: RENAL / URINARY TRACT ULTRASOUND COMPLETE COMPARISON:  CT 08/20/2019 FINDINGS: Right Kidney: Renal measurements: 6.9 x 3.2 x 5.5 cm = volume: 64 mL. Hyperechoic. No hydronephrosis or mass. Left Kidney: Renal measurements: 11.9 x 5.8 x 5.3 cm = volume: 188 mL. Slightly echogenic cortex. No hydronephrosis. No mass. Bladder: Appears normal for degree of bladder distention. Other: None. IMPRESSION: Atrophic right kidney. Mild compensatory hypertrophy of the left kidney. No obstruction. Slightly increased echogenicity of the left renal cortical tissue. Electronically Signed   By: Oneil Officer M.D.   On: 06/06/2023 21:41    Labs: BMET Recent Labs  Lab 06/02/23 0451 06/02/23 9175 06/02/23 1804 06/02/23 2028 06/03/23 0516 06/03/23 1040 06/03/23 1641 06/03/23 1834 06/04/23 9662 06/05/23 0920 06/06/23 0918 06/07/23 0509  NA 138   < >  --    < > 135 142  --  138 137 142 146* 143  K 5.3*   < >  --    < > 4.9 4.7  --  4.4 4.7 4.5 4.8 5.2*  CL 108   < >  --    < > 104 105  --  102 103 106 112* 110  CO2 19*   < >  --    < > 19*  20*  --  20* 17* 20* 18* 18*  GLUCOSE 180*   < >  --    < > 103* 95  --  136* 122* 160* 147* 115*  BUN 45*   < >  --    < > 68* 72*  --  77* 83* 96* 102* 105*  CREATININE 4.52*   < >  --    < > 5.08* 5.28*  --  5.43* 5.43* 5.65* 5.59* 5.63*  CALCIUM  9.7   < >  --    < > 9.5 10.0  --  9.9 9.6 9.8 10.2 10.0  PHOS 4.9*  --  5.7*  --  6.7* 7.0* 6.1*  --  5.2*  --   --   --    < > = values in this interval not displayed.   CBC Recent Labs  Lab 06/04/23 0337 06/05/23 0920 06/06/23 0918 06/07/23 0509  WBC 14.3*  10.8* 11.3* 10.2  HGB 8.7* 8.9* 8.5* 8.0*  HCT 26.5* 27.3* 26.5* 25.1*  MCV 94.6 93.5 95.7 95.4  PLT 132* 151 140* 134*    Medications:     amLODipine   10 mg Oral Daily   apixaban   5 mg Oral BID   arformoterol   15 mcg Nebulization BID   atorvastatin   20 mg Oral Daily   budesonide  (PULMICORT ) nebulizer solution  0.25 mg Nebulization BID   feeding supplement  237 mL Oral TID BM   hydrALAZINE   50 mg Oral Q8H   metoprolol  tartrate  50 mg Oral BID   multivitamin with minerals  1 tablet Oral Daily   polyethylene glycol  17 g Oral Daily   predniSONE   20 mg Oral Q breakfast   revefenacin   175 mcg Nebulization Daily   sodium bicarbonate   650 mg Oral TID   tamsulosin   0.4 mg Oral Daily    Gordy Blanch, MD 06/07/2023, 10:52 AM

## 2023-06-08 ENCOUNTER — Inpatient Hospital Stay (HOSPITAL_COMMUNITY): Payer: 59

## 2023-06-08 DIAGNOSIS — J441 Chronic obstructive pulmonary disease with (acute) exacerbation: Secondary | ICD-10-CM | POA: Diagnosis not present

## 2023-06-08 DIAGNOSIS — J9601 Acute respiratory failure with hypoxia: Secondary | ICD-10-CM

## 2023-06-08 LAB — RENAL FUNCTION PANEL
Albumin: 2.9 g/dL — ABNORMAL LOW (ref 3.5–5.0)
Anion gap: 15 (ref 5–15)
BUN: 100 mg/dL — ABNORMAL HIGH (ref 8–23)
CO2: 20 mmol/L — ABNORMAL LOW (ref 22–32)
Calcium: 10 mg/dL (ref 8.9–10.3)
Chloride: 109 mmol/L (ref 98–111)
Creatinine, Ser: 5.25 mg/dL — ABNORMAL HIGH (ref 0.61–1.24)
GFR, Estimated: 11 mL/min — ABNORMAL LOW (ref >60)
Glucose, Bld: 107 mg/dL — ABNORMAL HIGH (ref 70–99)
Phosphorus: 5.6 mg/dL — ABNORMAL HIGH (ref 2.5–4.6)
Potassium: 4.9 mmol/L (ref 3.5–5.1)
Sodium: 144 mmol/L (ref 135–145)

## 2023-06-08 LAB — ECHOCARDIOGRAM COMPLETE
Area-P 1/2: 3.67 cm2
Calc EF: 39.6 %
Height: 72 in
MV M vel: 5.52 m/s
MV Peak grad: 121.9 mm[Hg]
S' Lateral: 4.3 cm
Single Plane A2C EF: 49.1 %
Single Plane A4C EF: 34.4 %
Weight: 2867.2 [oz_av]

## 2023-06-08 LAB — BLOOD GAS, VENOUS
Acid-base deficit: 4 mmol/L — ABNORMAL HIGH (ref 0.0–2.0)
Bicarbonate: 20.8 mmol/L (ref 20.0–28.0)
Drawn by: 4926
O2 Saturation: 65.7 %
Patient temperature: 36.9
pCO2, Ven: 36 mm[Hg] — ABNORMAL LOW (ref 44–60)
pH, Ven: 7.37 (ref 7.25–7.43)
pO2, Ven: 41 mm[Hg] (ref 32–45)

## 2023-06-08 LAB — PROTIME-INR
INR: 2.2 — ABNORMAL HIGH (ref 0.8–1.2)
Prothrombin Time: 24.9 s — ABNORMAL HIGH (ref 11.4–15.2)

## 2023-06-08 MED ORDER — ALBUMIN HUMAN 25 % IV SOLN
12.5000 g | Freq: Once | INTRAVENOUS | Status: AC
  Administered 2023-06-08: 12.5 g via INTRAVENOUS
  Filled 2023-06-08: qty 50

## 2023-06-08 MED ORDER — FUROSEMIDE 10 MG/ML IJ SOLN
40.0000 mg | Freq: Once | INTRAMUSCULAR | Status: AC
  Administered 2023-06-08: 40 mg via INTRAVENOUS
  Filled 2023-06-08: qty 4

## 2023-06-08 MED ORDER — LORAZEPAM 0.5 MG PO TABS
0.5000 mg | ORAL_TABLET | Freq: Four times a day (QID) | ORAL | Status: DC | PRN
  Administered 2023-06-08 – 2023-06-09 (×4): 0.5 mg via ORAL
  Filled 2023-06-08 (×4): qty 1

## 2023-06-08 MED ORDER — GUAIFENESIN-CODEINE 100-10 MG/5ML PO SOLN
10.0000 mL | Freq: Four times a day (QID) | ORAL | Status: DC | PRN
  Administered 2023-06-10: 10 mL via ORAL
  Filled 2023-06-08: qty 10

## 2023-06-08 MED ORDER — IPRATROPIUM-ALBUTEROL 0.5-2.5 (3) MG/3ML IN SOLN
3.0000 mL | Freq: Once | RESPIRATORY_TRACT | Status: AC
  Administered 2023-06-08: 3 mL via RESPIRATORY_TRACT

## 2023-06-08 MED ORDER — TRAZODONE HCL 50 MG PO TABS
100.0000 mg | ORAL_TABLET | Freq: Every evening | ORAL | Status: DC | PRN
  Administered 2023-06-08: 100 mg via ORAL
  Filled 2023-06-08: qty 1

## 2023-06-08 MED ORDER — FUROSEMIDE 10 MG/ML IJ SOLN
80.0000 mg | Freq: Once | INTRAMUSCULAR | Status: AC
  Administered 2023-06-08: 80 mg via INTRAVENOUS
  Filled 2023-06-08: qty 8

## 2023-06-08 NOTE — Progress Notes (Signed)
 Mobility Specialist Progress Note;   06/08/23 1012  Mobility  Activity Transferred from bed to chair  Level of Assistance Contact guard assist, steadying assist  Assistive Device Front wheel walker  Distance Ambulated (ft) 7 ft  Activity Response Tolerated fair  Mobility Referral Yes  Mobility visit 1 Mobility  Mobility Specialist Start Time (ACUTE ONLY) 1012  Mobility Specialist Stop Time (ACUTE ONLY) 1027  Mobility Specialist Time Calculation (min) (ACUTE ONLY) 15 min   Pt agreeable to mobility w/ encouragement. On RA upon arrival. Deferred hallway ambulation at this time d/t feeling beat, agreeable to sit in chair. Required MinG assistance to ambulate around bed to chair. VSS throughout. Pt left comfortably in chair with all needs met, alarm on. Wife in room.   Lauraine Erm Mobility Specialist Please contact via SecureChat or [employer] [phone]

## 2023-06-08 NOTE — Progress Notes (Signed)
 Patient has requested a breathing treatment. Calle RT

## 2023-06-08 NOTE — Progress Notes (Signed)
 Progress Note   Patient: [name] [name] FMW:[id number] DOB: [birth year] DOA: 06/01/2023     7 DOS: the patient was seen and examined on 06/08/2023 at 10:24 AM      Brief hospital course: 73 y.o. M with smoking, COPD, CAD, AFib on warfarin, AAA s/p repair, sCHF recovered from 35->55%, and CKD IV baseline 4.4 who presented with AKI, COPD flare requiring intubation. Patient was in ICU.  Currently extubated. His breathing is slightly improving, however patient has developed significantly elevated creatinine.  Nephrology involved. 2/5, more short of breath and tired.   Assessment and Plan: COPD with acute exacerbation (HCC) Acute respiratory failure with hypoxia (HCC) Admitted 1/29 on BiPAP.  Respiratory status worsened and he was intubated.  Extubated 1/31, transferred out of unit.   -Continue steroid.  Will slowly taper to prednisone . - Continue bronchodilators - Continue Flonase  - Completed 5 days of antibiotics. Weaned off to room air, however he remains persistently dyspneic.  Ruling out uremia per the cause of dyspnea. Recheck chest x-ray and blood gas today.  Sepsis due to pneumonia (HCC) -Continue Rocephin  and azithromycin , completed antibiotics. -Incentive spirometer and flutter valve  Acute kidney injury on CKD IV Hyperkalemia Cr 4.4 on admission, slowly trending up. K resolved with fluids. - Avoid hypotension and nephrotoxins - Continue Bicarb -Urine output is adequate.  However patient is symptomatic and now developing anasarca.  Patient follows up with Dr. Rachele at [place].  With persistently elevated BUN/creatinine, nephrology is consulted.  Following.  Treated with fluid and albumin .   2/5, with evidence of developing anasarca.  Challenging with Lasix  today. Will recheck echocardiogram today.  Paroxysmal atrial fibrillation with RVR (HCC) Heart rate normal - On Coumadin  at home.  Started on Eliquis .  Previously not taking Eliquis  due to cost. - Continue  metoprolol .  Sinus rhythm.   Chronic systolic and diastolic congestive heart failure Dyslipidemia Essential hypertension Blood pressure stable today. - Continue amlodipine , metoprolol , hydralazine  - Continue Lipitor  - Hold hydralazine , nifedipine  -Hold Lasix  for creatinine worsening  Anemia of chronic kidney disease Hgb stable, no bleeidng observed Receiving IV iron .  Hypomagnesemia Supplemented and adequate.  Urinary retention Foley catheter in the ICU, now removed.  Able to urinate without difficulty today. -  Flomax  - Monitor urine output      Subjective: Patient seen and examined.  Tired and exhausted.  Today he was not able to finish sentences, however he was on room air.  Insist on going home.  Did not get any sleep.  Legs are swollen today. Case discussed with nephrology at the bedside.  Wife was called and updated. Advised him that he is not ready to go home.  He may end up needing dialysis.   Physical Exam: BP (!) 159/83 (BP Location: Right Arm)   Pulse (!) 102   Temp 98.4 F (36.9 C) (Oral)   Resp 18   Ht 6' (1.829 m)   Wt 81.3 kg   SpO2 96%   BMI 24.30 kg/m    General: Anxious.  Hyperventilating.  Very frail and tired. Alert awake and oriented.  Frail and debilitated. Cardiovascular: S1-S2 normal.  Regular rate rhythm. Respiratory: Bilateral poor air entry.  No added sounds.  Poor inspiratory effort.  No added sounds. Gastrointestinal: Soft.  Nontender.  Bowel sound present. Ext: 1+ edema both hands and legs.     Data Reviewed: Labs reviewed today.   Family Communication: Wife on the phone.   Disposition: Status is: Inpatient The patient was admitted with COPD  exacerbation and worsening renal function. Discharge home when renal functions stabilize.          Author: Renato Applebaum, MD 06/08/2023 11:54 AM  For on call review [email or web address].

## 2023-06-08 NOTE — Progress Notes (Signed)
 S:Having continued shortness of breath this morning. RN discussed with hospitalist that patient was having more LE edema. He would prefer to go home. O:BP (!) 159/83 (BP Location: Right Arm)   Pulse (!) 102   Temp 98.4 F (36.9 C) (Oral)   Resp 18   Ht 6' (1.829 m)   Wt 81.3 kg   SpO2 96%   BMI 24.30 kg/m   Intake/Output Summary (Last 24 hours) at 06/08/2023 1040 Last data filed at 06/08/2023 9376 Gross per 24 hour  Intake 666.62 ml  Output 875 ml  Net -208.38 ml   Intake/Output: I/O last 3 completed shifts: In: 1506.5 [P.O.:798; I.V.:599.9; IV Piggyback:108.6] Out: 1675 [Urine:1675]  Intake/Output this shift:  No intake/output data recorded. Weight change: 0.499 kg Hzw:Dpuupwh up in bed, NAD RCD:Pmmzhlojmob irregular, no m/r/g appreciated Resp:Diminished air movement throughout, speaking with 5-6 word dyspnea Jai:Wnwipduzwizi Ext:1+ pitting edema to BLE and mild nonpitting edema of BUE  Recent Labs  Lab 06/02/23 0451 06/02/23 0824 06/02/23 1804 06/02/23 2028 06/03/23 0516 06/03/23 1040 06/03/23 1641 06/03/23 1834 06/04/23 0337 06/05/23 0920 06/06/23 0918 06/07/23 0509 06/08/23 0457  NA 138   < >  --    < > 135 142  --  138 137 142 146* 143 144  K 5.3*   < >  --    < > 4.9 4.7  --  4.4 4.7 4.5 4.8 5.2* 4.9  CL 108   < >  --    < > 104 105  --  102 103 106 112* 110 109  CO2 19*   < >  --    < > 19* 20*  --  20* 17* 20* 18* 18* 20*  GLUCOSE 180*   < >  --    < > 103* 95  --  136* 122* 160* 147* 115* 107*  BUN 45*   < >  --    < > 68* 72*  --  77* 83* 96* 102* 105* 100*  CREATININE 4.52*   < >  --    < > 5.08* 5.28*  --  5.43* 5.43* 5.65* 5.59* 5.63* 5.25*  ALBUMIN   --   --   --   --   --   --   --   --   --   --   --   --  2.9*  CALCIUM  9.7   < >  --    < > 9.5 10.0  --  9.9 9.6 9.8 10.2 10.0 10.0  PHOS 4.9*  --  5.7*  --  6.7* 7.0* 6.1*  --  5.2*  --   --   --  5.6*   < > = values in this interval not displayed.   Liver Function Tests: Recent Labs  Lab  06/08/23 0457  ALBUMIN  2.9*   No results for input(s): LIPASE, AMYLASE in the last 168 hours. No results for input(s): AMMONIA in the last 168 hours. CBC: Recent Labs  Lab 06/03/23 0516 06/04/23 0337 06/05/23 0920 06/06/23 0918 06/07/23 0509  WBC 12.7* 14.3* 10.8* 11.3* 10.2  HGB 8.1* 8.7* 8.9* 8.5* 8.0*  HCT 25.5* 26.5* 27.3* 26.5* 25.1*  MCV 96.2 94.6 93.5 95.7 95.4  PLT 127* 132* 151 140* 134*   Cardiac Enzymes: No results for input(s): CKTOTAL, CKMB, CKMBINDEX, TROPONINI in the last 168 hours. CBG: Recent Labs  Lab 06/03/23 1114 06/03/23 1536 06/03/23 2029 06/04/23 0017 06/04/23 0416  GLUCAP 84 130* 135* 138* 122*    Iron   Studies: No results for input(s): IRON , TIBC, TRANSFERRIN, FERRITIN in the last 72 hours. Studies/Results: US  RENAL Result Date: 06/06/2023 CLINICAL DATA:  Acute kidney injury EXAM: RENAL / URINARY TRACT ULTRASOUND COMPLETE COMPARISON:  CT 08/20/2019 FINDINGS: Right Kidney: Renal measurements: 6.9 x 3.2 x 5.5 cm = volume: 64 mL. Hyperechoic. No hydronephrosis or mass. Left Kidney: Renal measurements: 11.9 x 5.8 x 5.3 cm = volume: 188 mL. Slightly echogenic cortex. No hydronephrosis. No mass. Bladder: Appears normal for degree of bladder distention. Other: None. IMPRESSION: Atrophic right kidney. Mild compensatory hypertrophy of the left kidney. No obstruction. Slightly increased echogenicity of the left renal cortical tissue. Electronically Signed   By: Oneil Officer M.D.   On: 06/06/2023 21:41    amLODipine   10 mg Oral Daily   apixaban   5 mg Oral BID   arformoterol   15 mcg Nebulization BID   atorvastatin   20 mg Oral Daily   budesonide  (PULMICORT ) nebulizer solution  0.25 mg Nebulization BID   feeding supplement  237 mL Oral TID BM   furosemide   80 mg Intravenous Once   hydrALAZINE   50 mg Oral Q8H   metoprolol  tartrate  50 mg Oral BID   multivitamin with minerals  1 tablet Oral Daily   polyethylene glycol  17 g Oral Daily    predniSONE   20 mg Oral Q breakfast   revefenacin   175 mcg Nebulization Daily   sodium bicarbonate   650 mg Oral TID   tamsulosin   0.4 mg Oral Daily    BMET    Component Value Date/Time   NA 144 06/08/2023 0457   NA 143 04/05/2023 0804   K 4.9 06/08/2023 0457   CL 109 06/08/2023 0457   CO2 20 (L) 06/08/2023 0457   GLUCOSE 107 (H) 06/08/2023 0457   BUN 100 (H) 06/08/2023 0457   BUN 42 (H) 04/05/2023 0804   CREATININE 5.25 (H) 06/08/2023 0457   CREATININE 2.99 (H) 01/22/2020 1501   CALCIUM  10.0 06/08/2023 0457   GFRNONAA 11 (L) 06/08/2023 0457   GFRNONAA 53 (L) 05/14/2013 0905   GFRAA 24 (L) 10/16/2019 1634   GFRAA 62 05/14/2013 0905   CBC    Component Value Date/Time   WBC 10.2 06/07/2023 0509   RBC 2.63 (L) 06/07/2023 0509   HGB 8.0 (L) 06/07/2023 0509   HGB 9.1 (L) 04/05/2023 0804   HCT 25.1 (L) 06/07/2023 0509   HCT 28.7 (L) 04/05/2023 0804   PLT 134 (L) 06/07/2023 0509   PLT 144 (L) 04/05/2023 0804   MCV 95.4 06/07/2023 0509   MCV 94 04/05/2023 0804   MCH 30.4 06/07/2023 0509   MCHC 31.9 06/07/2023 0509   RDW 15.7 (H) 06/07/2023 0509   RDW 14.0 04/05/2023 0804   LYMPHSABS 1.1 05/31/2023 0947   LYMPHSABS 1.2 04/05/2023 0804   MONOABS 0.6 05/31/2023 0947   EOSABS 0.2 05/31/2023 0947   EOSABS 0.1 04/05/2023 0804   BASOSABS 0.0 05/31/2023 0947   BASOSABS 0.0 04/05/2023 0804    Assessment/Plan:  1. Acute kidney Injury on chronic kidney disease stage IV: Nonoliguric and appears to be hemodynamically mediated in the setting of COPD exacerbation with increased insensible losses. FENa <1% further suggesting pre-renal etiology, possibly now with element of fluid overload. Continued need for steroids also contributing to persistent renal dysfunction and expect once completed course will have some improvement in renal function. However, given increase in edema today, hospitalist to order CXR, and we will order lasix  80 mg once with albumin  to augment diuretic effect.  Continue to closely follow renal function after diuresis. Patient remains not ready for discharge from renal standpoint. 2.  Hypernatremia: Secondary to dehydration from increased insensible losses, improved with D5 water  prior. Stable this AM. 3.  Hyperkalemia: Now upper end of normal this AM. CTM. 4.  Acute exacerbation of COPD: Per primary. With acute hypoxic respiratory failure.  Ongoing treatment with bronchodilators and corticosteroids as well as empiric antimicrobial coverage for CAP. 5.  Anemia: Likely secondary to chronic kidney disease. IDA appreciated on prior labs. He is s/p 1 dose of venofer . Plan for total 3 doses.  Stuart Redo, MD Family Medicine Resident, PGY-2 10:40 AM 06/08/2023  [employer] [phone]

## 2023-06-08 NOTE — Progress Notes (Signed)
 RT x2 unable to obtain ABG and pt refusing anymore sticks. Dr. Hilton Lucky made aware and will obtain VBG. RT will continue to monitor and be available as needed.

## 2023-06-08 NOTE — Progress Notes (Signed)
 Physical Therapy Treatment Patient Details Name: [name] [name] MRN: [id number] DOB: 1950/07/15 Today's Date: 06/08/2023   History of Present Illness 73 yo male admitted 1/29 with SOB, work up revealed afib with RVR, sepsis due to PNA, and COPD exacerbation. Intubated 1/30 due to respiratory failure, extubated 1/31. PMH includes: HTN, HLD, AAA s/p repair, MI, CAD, combined systolic and diastolic HF, Afib on coumadin , PVD, COPD, CKD IV, and smoker.    PT Comments  Pt declined sitting EOB or getting OOB today due to increased SOB at this time. Thus, focused session on strengthening/maintaining his strength in his lower extremity through performing exercises supine in bed instead. Pt fatigues fairly quickly and needs rest breaks due to DOE. Will continue to follow acutely.   If plan is discharge home, recommend the following: A little help with walking and/or transfers;A little help with bathing/dressing/bathroom;Assistance with cooking/housework;Help with stairs or ramp for entrance   Can travel by private vehicle        Equipment Recommendations  None recommended by PT (pt has rollator)    Recommendations for Other Services       Precautions / Restrictions Precautions Precautions: Fall Precaution Comments: watch 02 Restrictions Weight Bearing Restrictions Per Provider Order: No     Mobility  Bed Mobility               General bed mobility comments: Pt declined sitting EOB or getting OOB today due to increased SOB at this time. However, pt able to superiorly scoot in bed with supervision and with cues to flex his legs and push through his feet on the bed, bed in trendelenburg    Transfers                   General transfer comment: Pt declined sitting EOB or getting OOB today due to increased SOB at this time. Focused session on lower extremity exercises supine in bed instead    Ambulation/Gait               General Gait Details: Pt declined sitting EOB or  getting OOB today due to increased SOB at this time. Focused session on lower extremity exercises supine in bed instead   Stairs             Wheelchair Mobility     Tilt Bed    Modified Rankin (Stroke Patients Only)       Balance                                            Cognition Arousal: Lethargic, Alert Behavior During Therapy: WFL for tasks assessed/performed Overall Cognitive Status: Within Functional Limits for tasks assessed                                 General Comments: Intermittently lethargic, falling asleep when supine when not actively participating in exercises. Easy to arouse though.        Exercises General Exercises - Lower Extremity Ankle Circles/Pumps: AROM, Both, 10 reps, Supine Heel Slides: AROM, Both, 10 reps, Supine Hip ABduction/ADduction: AROM, Both, 10 reps, Supine Straight Leg Raises: AROM, Strengthening, Both, 10 reps, Supine (easier on L than R (but did L first then R, so maybe due to SOB))    General Comments General comments (skin integrity, edema, etc.): VSS  on [place] with breathing treatment during session      Pertinent Vitals/Pain Pain Assessment Pain Assessment: Faces Faces Pain Scale: Hurts a little bit Pain Location: generalized with mobility and difficulty breathing Pain Descriptors / Indicators: Discomfort, Grimacing Pain Intervention(s): Limited activity within patient's tolerance, Monitored during session, Repositioned    Home Living                          Prior Function            PT Goals (current goals can now be found in the care plan section) Acute Rehab PT Goals Patient Stated Goal: back home PT Goal Formulation: With patient/family Time For Goal Achievement: 06/16/23 Potential to Achieve Goals: Good Progress towards PT goals: Not progressing toward goals - comment (increased SOB today limiting session)    Frequency    Min 1X/week      PT Plan       Co-evaluation              AM-PAC PT 6 Clicks Mobility   Outcome Measure  Help needed turning from your back to your side while in a flat bed without using bedrails?: A Little Help needed moving from lying on your back to sitting on the side of a flat bed without using bedrails?: A Little Help needed moving to and from a bed to a chair (including a wheelchair)?: A Little Help needed standing up from a chair using your arms (e.g., wheelchair or bedside chair)?: A Little Help needed to walk in hospital room?: A Little Help needed climbing 3-5 steps with a railing? : A Little 6 Click Score: 18    End of Session Equipment Utilized During Treatment: Oxygen  Activity Tolerance: Patient limited by fatigue Patient left: in bed;with call bell/phone within reach (RN stated no need to put bed alarm on) Nurse Communication: Mobility status;Other (comment) (RN reporting no need for bed alarm) PT Visit Diagnosis: Other abnormalities of gait and mobility (R26.89);History of falling (Z91.81);Difficulty in walking, not elsewhere classified (R26.2)     Time: 8496-8479 PT Time Calculation (min) (ACUTE ONLY): 17 min  Charges:    $Therapeutic Exercise: 8-22 mins PT General Charges $$ ACUTE PT VISIT: 1 Visit                     Theo Ferretti, PT, DPT Acute Rehabilitation Services  Office: [phone]    Theo CHRISTELLA Ferretti 06/08/2023, 5:55 PM

## 2023-06-09 ENCOUNTER — Inpatient Hospital Stay (HOSPITAL_COMMUNITY): Payer: 59

## 2023-06-09 DIAGNOSIS — I5021 Acute systolic (congestive) heart failure: Secondary | ICD-10-CM

## 2023-06-09 DIAGNOSIS — J9601 Acute respiratory failure with hypoxia: Secondary | ICD-10-CM | POA: Diagnosis not present

## 2023-06-09 DIAGNOSIS — J441 Chronic obstructive pulmonary disease with (acute) exacerbation: Secondary | ICD-10-CM | POA: Diagnosis not present

## 2023-06-09 LAB — CBC WITH DIFFERENTIAL/PLATELET
Abs Immature Granulocytes: 0.34 10*3/uL — ABNORMAL HIGH (ref 0.00–0.07)
Basophils Absolute: 0 10*3/uL (ref 0.0–0.1)
Basophils Relative: 0 %
Eosinophils Absolute: 0 10*3/uL (ref 0.0–0.5)
Eosinophils Relative: 0 %
HCT: 24.5 % — ABNORMAL LOW (ref 39.0–52.0)
Hemoglobin: 7.6 g/dL — ABNORMAL LOW (ref 13.0–17.0)
Immature Granulocytes: 3 %
Lymphocytes Relative: 2 %
Lymphs Abs: 0.2 10*3/uL — ABNORMAL LOW (ref 0.7–4.0)
MCH: 30.6 pg (ref 26.0–34.0)
MCHC: 31 g/dL (ref 30.0–36.0)
MCV: 98.8 fL (ref 80.0–100.0)
Monocytes Absolute: 0.7 10*3/uL (ref 0.1–1.0)
Monocytes Relative: 7 %
Neutro Abs: 8.7 10*3/uL — ABNORMAL HIGH (ref 1.7–7.7)
Neutrophils Relative %: 88 %
Platelets: 113 10*3/uL — ABNORMAL LOW (ref 150–400)
RBC: 2.48 MIL/uL — ABNORMAL LOW (ref 4.22–5.81)
RDW: 15.9 % — ABNORMAL HIGH (ref 11.5–15.5)
WBC: 9.9 10*3/uL (ref 4.0–10.5)
nRBC: 0 % (ref 0.0–0.2)

## 2023-06-09 LAB — RENAL FUNCTION PANEL
Albumin: 3.3 g/dL — ABNORMAL LOW (ref 3.5–5.0)
Anion gap: 18 — ABNORMAL HIGH (ref 5–15)
BUN: 117 mg/dL — ABNORMAL HIGH (ref 8–23)
CO2: 20 mmol/L — ABNORMAL LOW (ref 22–32)
Calcium: 10.1 mg/dL (ref 8.9–10.3)
Chloride: 108 mmol/L (ref 98–111)
Creatinine, Ser: 5.74 mg/dL — ABNORMAL HIGH (ref 0.61–1.24)
GFR, Estimated: 10 mL/min — ABNORMAL LOW (ref >60)
Glucose, Bld: 108 mg/dL — ABNORMAL HIGH (ref 70–99)
Phosphorus: 8.4 mg/dL — ABNORMAL HIGH (ref 2.5–4.6)
Potassium: 5.7 mmol/L — ABNORMAL HIGH (ref 3.5–5.1)
Sodium: 146 mmol/L — ABNORMAL HIGH (ref 135–145)

## 2023-06-09 LAB — HEPATIC FUNCTION PANEL
ALT: 50 U/L — ABNORMAL HIGH (ref 0–44)
AST: 26 U/L (ref 15–41)
Albumin: 3.3 g/dL — ABNORMAL LOW (ref 3.5–5.0)
Alkaline Phosphatase: 51 U/L (ref 38–126)
Bilirubin, Direct: 0.2 mg/dL (ref 0.0–0.2)
Indirect Bilirubin: 0.4 mg/dL (ref 0.3–0.9)
Total Bilirubin: 0.6 mg/dL (ref 0.0–1.2)
Total Protein: 6.4 g/dL — ABNORMAL LOW (ref 6.5–8.1)

## 2023-06-09 LAB — PROTIME-INR
INR: 2.3 — ABNORMAL HIGH (ref 0.8–1.2)
Prothrombin Time: 25.6 s — ABNORMAL HIGH (ref 11.4–15.2)

## 2023-06-09 LAB — BRAIN NATRIURETIC PEPTIDE: B Natriuretic Peptide: 1022.9 pg/mL — ABNORMAL HIGH (ref 0.0–100.0)

## 2023-06-09 LAB — LACTIC ACID, PLASMA: Lactic Acid, Venous: 0.7 mmol/L (ref 0.5–1.9)

## 2023-06-09 LAB — GLUCOSE, CAPILLARY
Glucose-Capillary: 111 mg/dL — ABNORMAL HIGH (ref 70–99)
Glucose-Capillary: 120 mg/dL — ABNORMAL HIGH (ref 70–99)
Glucose-Capillary: 107 mg/dL — ABNORMAL HIGH (ref 70–99)
Glucose-Capillary: 119 mg/dL — ABNORMAL HIGH (ref 70–99)
Glucose-Capillary: 116 mg/dL — ABNORMAL HIGH (ref 70–99)

## 2023-06-09 LAB — BLOOD GAS, VENOUS
Acid-base deficit: 5.2 mmol/L — ABNORMAL HIGH (ref 0.0–2.0)
Bicarbonate: 20 mmol/L (ref 20.0–28.0)
O2 Saturation: 94.7 %
Patient temperature: 36.8
pCO2, Ven: 37 mm[Hg] — ABNORMAL LOW (ref 44–60)
pH, Ven: 7.34 (ref 7.25–7.43)
pO2, Ven: 66 mm[Hg] — ABNORMAL HIGH (ref 32–45)

## 2023-06-09 LAB — PROCALCITONIN: Procalcitonin: 2.01 ng/mL

## 2023-06-09 MED ORDER — METOLAZONE 5 MG PO TABS
5.0000 mg | ORAL_TABLET | Freq: Once | ORAL | Status: AC
  Administered 2023-06-09: 5 mg via ORAL
  Filled 2023-06-09: qty 1

## 2023-06-09 MED ORDER — SODIUM BICARBONATE 650 MG PO TABS
650.0000 mg | ORAL_TABLET | Freq: Three times a day (TID) | ORAL | Status: DC
  Administered 2023-06-09 – 2023-06-10 (×3): 650 mg via ORAL
  Filled 2023-06-09 (×3): qty 1

## 2023-06-09 MED ORDER — CHLORHEXIDINE GLUCONATE CLOTH 2 % EX PADS
6.0000 | MEDICATED_PAD | Freq: Every day | CUTANEOUS | Status: DC
  Administered 2023-06-09 – 2023-06-14 (×6): 6 via TOPICAL

## 2023-06-09 MED ORDER — FAMOTIDINE 20 MG PO TABS: 10.0000 mg | ORAL_TABLET | Freq: Every day | ORAL | Status: DC

## 2023-06-09 MED ORDER — METOLAZONE 5 MG PO TABS: 5.0000 mg | ORAL_TABLET | Freq: Once | ORAL | Status: DC

## 2023-06-09 MED ORDER — ALBUMIN HUMAN 25 % IV SOLN: 12.5000 g | Freq: Once | INTRAVENOUS | Status: DC

## 2023-06-09 MED ORDER — HEPARIN (PORCINE) 25000 UT/250ML-% IV SOLN: 1050.0000 [IU]/h | INTRAVENOUS | Status: DC

## 2023-06-09 MED ORDER — METHYLPREDNISOLONE SODIUM SUCC 40 MG IJ SOLR
40.0000 mg | Freq: Two times a day (BID) | INTRAMUSCULAR | Status: DC
  Administered 2023-06-09: 40 mg via INTRAVENOUS
  Filled 2023-06-09 (×2): qty 1

## 2023-06-09 MED ORDER — FENTANYL BOLUS VIA INFUSION: 25.0000 ug | INTRAVENOUS | Status: DC | PRN

## 2023-06-09 MED ORDER — FENTANYL 2500MCG IN NS 250ML (10MCG/ML) PREMIX INFUSION: 25.0000 ug/h | INTRAVENOUS | Status: DC

## 2023-06-09 MED ORDER — ALBUMIN HUMAN 25 % IV SOLN
12.5000 g | Freq: Once | INTRAVENOUS | Status: AC
  Administered 2023-06-09: 12.5 g via INTRAVENOUS
  Filled 2023-06-09: qty 50

## 2023-06-09 MED ORDER — HEPARIN (PORCINE) 25000 UT/250ML-% IV SOLN
950.0000 [IU]/h | INTRAVENOUS | Status: DC
  Administered 2023-06-09: 1050 [IU]/h via INTRAVENOUS
  Filled 2023-06-09: qty 250

## 2023-06-09 MED ORDER — FAMOTIDINE 20 MG PO TABS: 20.0000 mg | ORAL_TABLET | Freq: Two times a day (BID) | ORAL | Status: DC

## 2023-06-09 MED ORDER — SUCCINYLCHOLINE CHLORIDE 200 MG/10ML IV SOSY
PREFILLED_SYRINGE | INTRAVENOUS | Status: AC
  Filled 2023-06-09: qty 10

## 2023-06-09 MED ORDER — DOCUSATE SODIUM 50 MG/5ML PO LIQD: 100.0000 mg | Freq: Two times a day (BID) | ORAL | Status: DC

## 2023-06-09 MED ORDER — FUROSEMIDE 10 MG/ML IJ SOLN: 80.0000 mg | Freq: Once | INTRAMUSCULAR | Status: DC

## 2023-06-09 MED ORDER — ALBUMIN HUMAN 25 % IV SOLN
25.0000 g | Freq: Once | INTRAVENOUS | Status: DC
Start: 2023-06-09 — End: 2023-06-09

## 2023-06-09 MED ORDER — KETAMINE HCL 50 MG/5ML IJ SOSY
PREFILLED_SYRINGE | INTRAMUSCULAR | Status: AC
  Filled 2023-06-09: qty 10

## 2023-06-09 MED ORDER — FUROSEMIDE 10 MG/ML IJ SOLN
120.0000 mg | Freq: Once | INTRAVENOUS | Status: AC
  Administered 2023-06-09: 120 mg via INTRAVENOUS
  Filled 2023-06-09: qty 10

## 2023-06-09 MED ORDER — METHYLPREDNISOLONE SODIUM SUCC 125 MG IJ SOLR
125.0000 mg | Freq: Once | INTRAMUSCULAR | Status: AC
  Administered 2023-06-09: 125 mg via INTRAVENOUS
  Filled 2023-06-09: qty 2

## 2023-06-09 MED ORDER — FAMOTIDINE 20 MG PO TABS
10.0000 mg | ORAL_TABLET | Freq: Every day | ORAL | Status: DC
  Administered 2023-06-10: 10 mg via ORAL
  Filled 2023-06-09: qty 1

## 2023-06-09 MED ORDER — FUROSEMIDE 10 MG/ML IJ SOLN
15.0000 mg/h | INTRAMUSCULAR | Status: DC
  Administered 2023-06-09 (×2): 15 mg/h via INTRAVENOUS
  Filled 2023-06-09 (×3): qty 20

## 2023-06-09 MED ORDER — ADULT MULTIVITAMIN W/MINERALS CH
1.0000 | ORAL_TABLET | Freq: Every day | ORAL | Status: DC
  Administered 2023-06-10: 1 via ORAL
  Filled 2023-06-09: qty 1

## 2023-06-09 MED ORDER — ATORVASTATIN CALCIUM 10 MG PO TABS: 20.0000 mg | ORAL_TABLET | Freq: Every day | ORAL | Status: DC

## 2023-06-09 MED ORDER — FENTANYL CITRATE PF 50 MCG/ML IJ SOSY
PREFILLED_SYRINGE | INTRAMUSCULAR | Status: AC
  Filled 2023-06-09: qty 2

## 2023-06-09 MED ORDER — FUROSEMIDE 10 MG/ML IJ SOLN
80.0000 mg | Freq: Once | INTRAMUSCULAR | Status: AC
  Administered 2023-06-09: 80 mg via INTRAVENOUS
  Filled 2023-06-09: qty 8

## 2023-06-09 MED ORDER — POLYETHYLENE GLYCOL 3350 17 G PO PACK: 17.0000 g | PACK | Freq: Every day | ORAL | Status: DC

## 2023-06-09 MED ORDER — METOLAZONE 5 MG PO TABS
5.0000 mg | ORAL_TABLET | Freq: Once | ORAL | Status: DC
Start: 2023-06-09 — End: 2023-06-09

## 2023-06-09 MED ORDER — ETOMIDATE 2 MG/ML IV SOLN
INTRAVENOUS | Status: AC
  Filled 2023-06-09: qty 20

## 2023-06-09 MED ORDER — ROCURONIUM BROMIDE 10 MG/ML (PF) SYRINGE
PREFILLED_SYRINGE | INTRAVENOUS | Status: AC
  Filled 2023-06-09: qty 10

## 2023-06-09 MED ORDER — CLEVIDIPINE BUTYRATE 0.5 MG/ML IV EMUL
0.0000 mg/h | INTRAVENOUS | Status: DC
  Administered 2023-06-09 (×2): 16 mg/h via INTRAVENOUS
  Administered 2023-06-09: 2 mg/h via INTRAVENOUS
  Administered 2023-06-10 (×4): 14 mg/h via INTRAVENOUS
  Administered 2023-06-10: 20 mg/h via INTRAVENOUS
  Administered 2023-06-10: 10 mg/h via INTRAVENOUS
  Administered 2023-06-11 (×2): 20 mg/h via INTRAVENOUS
  Filled 2023-06-09 (×11): qty 100

## 2023-06-09 MED ORDER — DOCUSATE SODIUM 50 MG/5ML PO LIQD
100.0000 mg | Freq: Two times a day (BID) | ORAL | Status: DC
  Administered 2023-06-10 (×2): 100 mg via ORAL
  Filled 2023-06-09 (×2): qty 10

## 2023-06-09 MED ORDER — STERILE WATER FOR INJECTION IJ SOLN
INTRAMUSCULAR | Status: AC
  Filled 2023-06-09: qty 10

## 2023-06-09 MED ORDER — MIDAZOLAM HCL 2 MG/2ML IJ SOLN
INTRAMUSCULAR | Status: AC
  Filled 2023-06-09: qty 2

## 2023-06-09 MED ORDER — OLANZAPINE 10 MG IM SOLR
2.5000 mg | Freq: Once | INTRAMUSCULAR | Status: AC | PRN
  Administered 2023-06-09: 2.5 mg via INTRAMUSCULAR
  Filled 2023-06-09: qty 10

## 2023-06-09 MED ORDER — POLYETHYLENE GLYCOL 3350 17 G PO PACK
17.0000 g | PACK | Freq: Every day | ORAL | Status: DC
  Administered 2023-06-10: 17 g via ORAL
  Filled 2023-06-09: qty 1

## 2023-06-09 MED ORDER — ATORVASTATIN CALCIUM 10 MG PO TABS
20.0000 mg | ORAL_TABLET | Freq: Every day | ORAL | Status: DC
  Administered 2023-06-10: 20 mg via ORAL
  Filled 2023-06-09: qty 2

## 2023-06-09 MED ORDER — DEXMEDETOMIDINE HCL IN NACL 400 MCG/100ML IV SOLN
0.0000 ug/kg/h | INTRAVENOUS | Status: DC
  Administered 2023-06-10: 0.4 ug/kg/h via INTRAVENOUS
  Administered 2023-06-10: 0.8 ug/kg/h via INTRAVENOUS
  Administered 2023-06-10: 1 ug/kg/h via INTRAVENOUS
  Filled 2023-06-09 (×2): qty 100

## 2023-06-09 MED ORDER — ADULT MULTIVITAMIN W/MINERALS CH: 1.0000 | ORAL_TABLET | Freq: Every day | ORAL | Status: DC

## 2023-06-09 MED ORDER — SODIUM BICARBONATE 650 MG PO TABS: 650.0000 mg | ORAL_TABLET | Freq: Three times a day (TID) | ORAL | Status: DC

## 2023-06-09 NOTE — Progress Notes (Signed)
 Notified MD to come see the patient due to the patient's deterioration. He is on BIPAP breathing 30-38 times per minute. Trying to take the BIPAP off. Notified that the wife is at the bedside.

## 2023-06-09 NOTE — Progress Notes (Signed)
 PHARMACY - ANTICOAGULATION CONSULT NOTE  Pharmacy Consult for Eliquis > heparin   Indication: atrial fibrillation  No Known Allergies  Patient Measurements: Height: 6' (182.9 cm) Weight: 77.2 kg (170 lb 1.6 oz) IBW/kg (Calculated) : 77.6    Vital Signs: Temp: 97.7 F (36.5 C) (02/06 0726) Temp Source: Axillary (02/06 0726) BP: 166/71 (02/06 0726) Pulse Rate: 94 (02/06 0726)  Labs: Recent Labs    06/06/23 0918 06/07/23 0509 06/08/23 0457  HGB 8.5* 8.0*  --   HCT 26.5* 25.1*  --   PLT 140* 134*  --   LABPROT 19.1* 22.3* 24.9*  INR 1.6* 1.9* 2.2*  CREATININE 5.59* 5.63* 5.25*    Estimated Creatinine Clearance: 14.3 mL/min (A) (by C-G formula based on SCr of 5.25 mg/dL (H)).   Medical History: Past Medical History:  Diagnosis Date   AAA (abdominal aortic aneurysm) (HCC)    a. s/p repair 06/2015 with left renal artery bypass at that time, post-op course c/b renal failure requiring dialysis, C dif.   Anemia    Arteriosclerotic cardiovascular disease (ASCVD)    a. AMI in 2000 treated at [facility]. b. cath in 12/2006->  Chronic total obstruction of the RCA;  drug-eluting stent placed in OM1, LVEF abnormal.   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 Pt is on warfarin PTA. Previously Eliquis  copay was not affordable. However, upon copay check this admission, Eliquis  $0/mo. Therefore pharmacy consulted to switch anticoagulation from warfarin to Eliquis .  2/6- noted with increased work of breathing on bipap. Changing apixaban  (last dose 2/5 at 8pm) to IV heparin   -hg= 8 (stable), plt= 134  Goal of Therapy:  Monitor platelets by anticoagulation protocol: Yes   Plan:  -No IV heparin  bolus due to recent apixaban  -Start heparin  at 1050 units/hr -heparin  level and aPTT in 8 hrs  Prentice Poisson, PharmD Clinical Pharmacist **Pharmacist phone directory can now be found on [email or web address] ([email or web address]).  Listed under [facility].

## 2023-06-09 NOTE — Progress Notes (Signed)
 Detail note to follow  Patient with increased work of breathing and very poor air entry . He is on BIPAP 40% but getting exhausted. EF 30% , not pulling much air. Wife at the bedside .   Continue BIPAP Consulted PCCP and cardiology notified.  Hold Eliquis - start heparin .  CXR without pneumothorax   Code status: Patient and wife clear that limited trial of HD and ventillator to stabilize him is ok . He does not want CPR, chest compression in case of cardiac arreast. Will change it to reflect in goal of care.

## 2023-06-09 NOTE — Progress Notes (Signed)
 Progress Note   Patient: [name] [name] FMW:[id number] DOB: 1951-04-04 DOA: 06/01/2023     8 DOS: the patient was seen and examined on 06/09/2023 at 10:24 AM      Brief hospital course: 73 y.o. M with smoking, COPD, CAD, AFib on warfarin, AAA s/p repair, sCHF recovered from 35->55%, and CKD IV baseline 4.4 who presented with AKI, COPD flare requiring intubation. Patient initially admitted from [facility], failed BiPAP intubated and extubated in 24 hours. He was transferred out of ICU on room air.  He had fluctuating status. 2/5, more short of breath and tired.  Evidence of fluid overload. 2/6 , increased work of breathing . Ef 30% and transferring to ICU. patient with respiratory distress.  Transferred to ICU.  Consulted critical care, neurology and cardiology.   Assessment and Plan: COPD with acute exacerbation (HCC) Acute respiratory failure with hypoxia (HCC) Sepsis due to pneumonia Acute respiratory distress secondary to combination of COPD and acute CHF. Admitted 1/29 on BiPAP.  Respiratory status worsened and he was intubated.  Extubated 1/31, transferred out of unit.   Patient was treated aggressively with bronchodilator therapy, tapering dose of prednisone .  Over the last 2 days patient continues to have worsening shortness of breath, increased work of breathing and very poor air entry. 2/6, overnight on BiPAP, tachypneic with respiratory rate of 40.  No air entry at bases.  Chest x-ray without complication but patient with significant shortness of breath. Transfer to ICU. Earlier patient has finished 5 days of antibiotic therapies.   Acute kidney injury on CKD IV Hyperkalemia Cr 4.4 on admission, trending up.  Followed by nephrology.  On diuretics trial.  Making urine, 1.1 L last 24 hours but with respiratory distress, EF 30%.  He may need hemodialysis.  Paroxysmal atrial fibrillation with RVR (HCC) Heart rate normal - On Coumadin  at home.  Started on Eliquis .   Previously not taking Eliquis  due to cost. - Continue metoprolol .  Sinus rhythm. -- Discontinue Eliquis .  Start heparin  in anticipation of needing central lines/Alysis access or other procedures.   Acute on chronic systolic and diastolic congestive heart failure Dyslipidemia Essential hypertension Blood pressure stable today. - Continue amlodipine , metoprolol , hydralazine  - Continue Lipitor  - Hold hydralazine , nifedipine  -Diuresis as per nephrology. Patient may need advanced heart failure therapy, consulted cardiology.  Anemia of chronic kidney disease Hgb stable, no bleeidng observed Receiving IV iron .  Hypomagnesemia Supplemented and adequate.  Urinary retention Foley catheter in the ICU, now removed.  Able to urinate without difficulty today. -  Flomax  - Monitor urine output      Subjective: Patient was seen and examined.  Overnight events noted.  Patient tachypneic, difficult to complete sentences.  He was on BiPAP 40% FiO2.  I removed his BiPAP and discussed with him about CODE STATUS along with his wife at the bedside.  Patient patient tells me that he is fine with short-term intubation and  trial of dialysis but he does not want dependent on ventilator/tracheostomy.  Patient does not want chest compressions or cardioversion in case of cardiac arrest. Called and discussed with ICU.  Called and discussed with cardiology. Patient wanting something for anxiety.    Physical Exam: BP (!) 166/71 (BP Location: Right Arm)   Pulse 94   Temp 97.7 F (36.5 C) (Axillary)   Resp (!) 34   Ht 6' (1.829 m)   Wt 77.2 kg   SpO2 100%   BMI 23.07 kg/m    General: Anxious.  Hyperventilating.  Very frail and tired. Moderate respiratory distress.  Unable to complete sentences. Alert awake and oriented.  Frail and debilitated. Cardiovascular: S1-S2 normal.  Regular rate rhythm. Respiratory: Bilateral poor air entry.  Poor inspiratory effort.  No added sounds. Gastrointestinal:  Soft.  Nontender.  Bowel sound present. Ext: 1+ edema both hands and legs.     Data Reviewed: Labs reviewed today.   Family Communication: Wife at the bedside.   Disposition: Status is: Inpatient Transfer to ICU.      Total time spent: 52 minutes   Author: Renato Applebaum, MD 06/09/2023 10:07 AM  For on call review [email or web address].

## 2023-06-09 NOTE — Plan of Care (Signed)
  Problem: Fluid Volume: Goal: Hemodynamic stability will improve Outcome: Progressing   Problem: Clinical Measurements: Goal: Diagnostic test results will improve Outcome: Progressing Goal: Signs and symptoms of infection will decrease Outcome: Progressing   Problem: Respiratory: Goal: Ability to maintain adequate ventilation will improve Outcome: Progressing   Problem: Education: Goal: Knowledge of General Education information will improve Description: Including pain rating scale, medication(s)/side effects and non-pharmacologic comfort measures Outcome: Progressing   Problem: Health Behavior/Discharge Planning: Goal: Ability to manage health-related needs will improve Outcome: Progressing   Problem: Clinical Measurements: Goal: Ability to maintain clinical measurements within normal limits will improve Outcome: Progressing Goal: Will remain free from infection Outcome: Progressing Goal: Diagnostic test results will improve Outcome: Progressing Goal: Respiratory complications will improve Outcome: Progressing Goal: Cardiovascular complication will be avoided Outcome: Progressing   Problem: Activity: Goal: Risk for activity intolerance will decrease Outcome: Progressing   Problem: Nutrition: Goal: Adequate nutrition will be maintained Outcome: Progressing   Problem: Coping: Goal: Level of anxiety will decrease Outcome: Progressing   Problem: Elimination: Goal: Will not experience complications related to bowel motility Outcome: Progressing Goal: Will not experience complications related to urinary retention Outcome: Progressing   Problem: Pain Managment: Goal: General experience of comfort will improve and/or be controlled Outcome: Progressing   Problem: Safety: Goal: Ability to remain free from injury will improve Outcome: Progressing   Problem: Skin Integrity: Goal: Risk for impaired skin integrity will decrease Outcome: Progressing   Problem:  Safety: Goal: Non-violent Restraint(s) Outcome: Progressing

## 2023-06-09 NOTE — Progress Notes (Signed)
 S:Had increased SOB ON and placed on BiPAP that initially improved but worsened again. Second dose of albumin  and lasix  was administered. He was on BiPAP again this AM, and a rapid response was called. He was taken to the ICU. O:BP (!) 166/71 (BP Location: Right Arm)   Pulse 94   Temp 97.7 F (36.5 C) (Axillary)   Resp (!) 34   Ht 6' (1.829 m)   Wt 77.2 kg   SpO2 100%   BMI 23.07 kg/m   Intake/Output Summary (Last 24 hours) at 06/09/2023 0948 Last data filed at 06/09/2023 0514 Gross per 24 hour  Intake 274.57 ml  Output 1150 ml  Net -875.43 ml   Intake/Output: I/O last 3 completed shifts: In: 701.2 [P.O.:438; IV Piggyback:263.2] Out: 1525 [Urine:1525]  Intake/Output this shift:  No intake/output data recorded. Weight change: -4.128 kg Hzw:Obpwh in bed, mild distress, alert CVS:RRR without m/r/g Resp:Markedly decreased breath sounds throughout  Recent Labs  Lab 06/02/23 1804 06/02/23 2028 06/03/23 0516 06/03/23 1040 06/03/23 1641 06/03/23 1834 06/04/23 0337 06/05/23 0920 06/06/23 0918 06/07/23 0509 06/08/23 0457  NA  --    < > 135 142  --  138 137 142 146* 143 144  K  --    < > 4.9 4.7  --  4.4 4.7 4.5 4.8 5.2* 4.9  CL  --    < > 104 105  --  102 103 106 112* 110 109  CO2  --    < > 19* 20*  --  20* 17* 20* 18* 18* 20*  GLUCOSE  --    < > 103* 95  --  136* 122* 160* 147* 115* 107*  BUN  --    < > 68* 72*  --  77* 83* 96* 102* 105* 100*  CREATININE  --    < > 5.08* 5.28*  --  5.43* 5.43* 5.65* 5.59* 5.63* 5.25*  ALBUMIN   --   --   --   --   --   --   --   --   --   --  2.9*  CALCIUM   --    < > 9.5 10.0  --  9.9 9.6 9.8 10.2 10.0 10.0  PHOS 5.7*  --  6.7* 7.0* 6.1*  --  5.2*  --   --   --  5.6*   < > = values in this interval not displayed.   Liver Function Tests: Recent Labs  Lab 06/08/23 0457  ALBUMIN  2.9*   No results for input(s): LIPASE, AMYLASE in the last 168 hours. No results for input(s): AMMONIA in the last 168 hours. CBC: Recent Labs  Lab  06/03/23 0516 06/04/23 0337 06/05/23 0920 06/06/23 0918 06/07/23 0509  WBC 12.7* 14.3* 10.8* 11.3* 10.2  HGB 8.1* 8.7* 8.9* 8.5* 8.0*  HCT 25.5* 26.5* 27.3* 26.5* 25.1*  MCV 96.2 94.6 93.5 95.7 95.4  PLT 127* 132* 151 140* 134*   Cardiac Enzymes: No results for input(s): CKTOTAL, CKMB, CKMBINDEX, TROPONINI in the last 168 hours. CBG: Recent Labs  Lab 06/03/23 2029 06/04/23 0017 06/04/23 0416 06/09/23 0723 06/09/23 0929  GLUCAP 135* 138* 122* 111* 120*    Iron  Studies: No results for input(s): IRON , TIBC, TRANSFERRIN, FERRITIN in the last 72 hours. Studies/Results: DG CHEST PORT 1 VIEW Result Date: 06/09/2023 CLINICAL DATA:  10026 Shortness of breath 10026 EXAM: PORTABLE CHEST 1 VIEW COMPARISON:  CXR 06/08/23 FINDINGS: Unchanged small left pleural effusion and improved right-sided pleural effusion. No pneumothorax. Cardiomegaly. Prominent  bilateral interstitial opacities could represent pulmonary venous congestion or mild pulmonary edema. No focal airspace opacity. No radiographically apparent displaced rib fractures. Visualized upper abdomen unremarkable. IMPRESSION: Cardiomegaly with improved right-sided pleural effusion and unchanged left-sided pleural effusion no focal airspace opacity Electronically Signed   By: Lyndall Gore M.D.   On: 06/09/2023 08:33   DG CHEST PORT 1 VIEW Result Date: 06/08/2023 CLINICAL DATA:  Shortness of breath EXAM: PORTABLE CHEST 1 VIEW COMPARISON:  06/03/2023 FINDINGS: Previously seen endotracheal tube and nasogastric tube no longer present. Chronic cardiomegaly. Coronary artery stents. Aortic atherosclerotic calcification. Pulmonary venous hypertension without frank edema. Small bilateral pleural effusions. Findings consistent with low-grade congestive heart failure. IMPRESSION: Low-grade congestive heart failure. Electronically Signed   By: Oneil Officer M.D.   On: 06/08/2023 16:15   ECHOCARDIOGRAM COMPLETE Result Date: 06/08/2023     ECHOCARDIOGRAM REPORT   Patient Name:   [name] [name] Date of Exam: 06/08/2023 Medical Rec #:  [id number]       Height:       72.0 in Accession #:    [id number]      Weight:       179.2 lb Date of Birth:  Aug 07, 1950       BSA:          2.033 m Patient Age:    73 years        BP:           180/71 mmHg Patient Gender: M               HR:           87 bpm. Exam Location:  Inpatient Procedure: 2D Echo, Cardiac Doppler and Color Doppler  History:        Patient has prior history of Echocardiogram examinations, most                 recent 08/19/2017. CHF, CAD, COPD, Arrythmias:Atrial                 Fibrillation, Signs/Symptoms:Shortness of Breath; Risk                 Factors:Hypertension. ESRD.  Sonographer:    Lanell Maduro Referring Phys: [id number] KUBER GHIMIRE IMPRESSIONS  1. HYpokinesis worse in the lateral and inferior walls . Left ventricular ejection fraction, by estimation, is 35 to 40%. The left ventricle has moderately decreased function. The left ventricle demonstrates global hypokinesis. There is mild left ventricular hypertrophy. Left ventricular diastolic parameters are indeterminate.  2. Right ventricular systolic function is normal. The right ventricular size is normal.  3. Left atrial size was severely dilated.  4. Right atrial size was severely dilated.  5. Mild to moderate mitral valve regurgitation.  6. Tricuspid valve regurgitation is mild to moderate.  7. The aortic valve is tricuspid. Aortic valve regurgitation is not visualized. Aortic valve sclerosis/calcification is present, without any evidence of aortic stenosis.  8. The inferior vena cava is dilated in size with <50% respiratory variability, suggesting right atrial pressure of 15 mmHg. Comparison(s): The left ventricular function is worsened. FINDINGS  Left Ventricle: HYpokinesis worse in the lateral and inferior walls. Left ventricular ejection fraction, by estimation, is 35 to 40%. The left ventricle has moderately decreased function. The  left ventricle demonstrates global hypokinesis. The left ventricular internal cavity size was normal in size. There is mild left ventricular hypertrophy. Left ventricular diastolic parameters are indeterminate. Right Ventricle: The right ventricular size is normal. Right vetricular wall thickness was  not assessed. Right ventricular systolic function is normal. Left Atrium: Left atrial size was severely dilated. Right Atrium: Right atrial size was severely dilated. Pericardium: Trivial pericardial effusion is present. Mitral Valve: Mild mitral annular calcification. Mild to moderate mitral valve regurgitation. Tricuspid Valve: The tricuspid valve is normal in structure. Tricuspid valve regurgitation is mild to moderate. Aortic Valve: The aortic valve is tricuspid. Aortic valve regurgitation is not visualized. Aortic valve sclerosis/calcification is present, without any evidence of aortic stenosis. Pulmonic Valve: The pulmonic valve was grossly normal. Pulmonic valve regurgitation is trivial. Aorta: The aortic root and ascending aorta are structurally normal, with no evidence of dilitation. Venous: The inferior vena cava is dilated in size with less than 50% respiratory variability, suggesting right atrial pressure of 15 mmHg. IAS/Shunts: No atrial level shunt detected by color flow Doppler.  LEFT VENTRICLE PLAX 2D LVIDd:         5.30 cm      Diastology LVIDs:         4.30 cm      LV e' medial:    5.11 cm/s LV PW:         1.30 cm      LV E/e' medial:  33.9 LV IVS:        0.90 cm      LV e' lateral:   8.38 cm/s LVOT diam:     2.30 cm      LV E/e' lateral: 20.6 LV SV:         66 LV SV Index:   33 LVOT Area:     4.15 cm  LV Volumes (MOD) LV vol d, MOD A2C: 117.0 ml LV vol d, MOD A4C: 75.0 ml LV vol s, MOD A2C: 59.6 ml LV vol s, MOD A4C: 49.2 ml LV SV MOD A2C:     57.4 ml LV SV MOD A4C:     75.0 ml LV SV MOD BP:      38.4 ml RIGHT VENTRICLE             IVC RV Basal diam:  2.80 cm     IVC diam: 2.90 cm RV S prime:      10.30 cm/s TAPSE (M-mode): 1.7 cm LEFT ATRIUM              Index        RIGHT ATRIUM           Index LA diam:        5.90 cm  2.90 cm/m   RA Area:     32.80 cm LA Vol (A2C):   151.0 ml 74.26 ml/m  RA Volume:   118.00 ml 58.03 ml/m LA Vol (A4C):   101.0 ml 49.67 ml/m LA Biplane Vol: 124.0 ml 60.98 ml/m  AORTIC VALVE LVOT Vmax:   95.25 cm/s LVOT Vmean:  60.350 cm/s LVOT VTI:    0.160 m  AORTA Ao Root diam: 3.30 cm Ao Asc diam:  3.70 cm MITRAL VALVE                TRICUSPID VALVE MV Area (PHT): 3.67 cm     TR Peak grad:   30.0 mmHg MR Peak grad: 121.9 mmHg    TR Vmax:        274.00 cm/s MR Mean grad: 82.0 mmHg MR Vmax:      552.00 cm/s   SHUNTS MR Vmean:     433.0 cm/s    Systemic VTI:  0.16 m MV E velocity: 173.00  cm/s  Systemic Diam: 2.30 cm Vina Gull MD Electronically signed by Vina Gull MD Signature Date/Time: 06/08/2023/3:39:02 PM    Final     arformoterol   15 mcg Nebulization BID   atorvastatin   20 mg Oral Daily   budesonide  (PULMICORT ) nebulizer solution  0.25 mg Nebulization BID   docusate  100 mg Per Tube BID   etomidate        famotidine   20 mg Per Tube BID   feeding supplement  237 mL Oral TID BM   fentaNYL        furosemide   80 mg Intravenous Once   furosemide   80 mg Intravenous Once   ketamine  HCl       methylPREDNISolone  (SOLU-MEDROL ) injection  125 mg Intravenous Once   methylPREDNISolone  (SOLU-MEDROL ) injection  40 mg Intravenous Q12H   midazolam        multivitamin with minerals  1 tablet Oral Daily   polyethylene glycol  17 g Oral Daily   revefenacin   175 mcg Nebulization Daily   rocuronium        sodium bicarbonate   650 mg Oral TID   succinylcholine        tamsulosin   0.4 mg Oral Daily    BMET    Component Value Date/Time   NA 144 06/08/2023 0457   NA 143 04/05/2023 0804   K 4.9 06/08/2023 0457   CL 109 06/08/2023 0457   CO2 20 (L) 06/08/2023 0457   GLUCOSE 107 (H) 06/08/2023 0457   BUN 100 (H) 06/08/2023 0457   BUN 42 (H) 04/05/2023 0804   CREATININE 5.25 (H)  06/08/2023 0457   CREATININE 2.99 (H) 01/22/2020 1501   CALCIUM  10.0 06/08/2023 0457   GFRNONAA 11 (L) 06/08/2023 0457   GFRNONAA 53 (L) 05/14/2013 0905   GFRAA 24 (L) 10/16/2019 1634   GFRAA 62 05/14/2013 0905   CBC    Component Value Date/Time   WBC 10.2 06/07/2023 0509   RBC 2.63 (L) 06/07/2023 0509   HGB 8.0 (L) 06/07/2023 0509   HGB 9.1 (L) 04/05/2023 0804   HCT 25.1 (L) 06/07/2023 0509   HCT 28.7 (L) 04/05/2023 0804   PLT 134 (L) 06/07/2023 0509   PLT 144 (L) 04/05/2023 0804   MCV 95.4 06/07/2023 0509   MCV 94 04/05/2023 0804   MCH 30.4 06/07/2023 0509   MCHC 31.9 06/07/2023 0509   RDW 15.7 (H) 06/07/2023 0509   RDW 14.0 04/05/2023 0804   LYMPHSABS 1.1 05/31/2023 0947   LYMPHSABS 1.2 04/05/2023 0804   MONOABS 0.6 05/31/2023 0947   EOSABS 0.2 05/31/2023 0947   EOSABS 0.1 04/05/2023 0804   BASOSABS 0.0 05/31/2023 0947   BASOSABS 0.0 04/05/2023 0804     Assessment/Plan:  1. Acute kidney Injury on chronic kidney disease stage IV: Nonoliguric and appears to be hemodynamically mediated in the setting of COPD exacerbation with increased insensible losses. FENa <1% further suggesting pre-renal etiology, now with element of fluid overload. Continued need for steroids also contributing to persistent renal dysfunction and expect once completed course will have some improvement in renal function. CXR yesterday with pulmonary congestion. He is s/p 3 doses of lasix  in last 24 hours with over 1 L in UOP. We are awaiting renal function panel this AM. Question if volume overload is only source of decompensation this AM, however. Will go ahead and redose lasix  and assess improvement given good UOP. Continue to consider CRRT depending on renal function. Patient remains not ready for discharge from renal standpoint. 2.  Hypernatremia: Secondary to dehydration  from increased insensible losses, improved with D5 water  prior. Awaiting updated labs this AM. 3.  Hyperkalemia: Awaiting labs this  AM. 4.  Acute exacerbation of COPD: With acute hypoxic respiratory failure.  Ongoing treatment with bronchodilators and corticosteroids as well as empiric antimicrobial coverage for CAP. Now in ICU on BiPAP due to worsening status. 5.  Anemia: Likely secondary to chronic kidney disease. IDA appreciated on prior labs. He is s/p 3 doses of venofer . Plan for total 4 doses.  Stuart Redo, MD Family Medicine Resident, PGY-2 9:48 AM 06/09/2023  [employer] [phone]

## 2023-06-09 NOTE — Progress Notes (Signed)
 Patient was seen for increased SOB. He was placed on BiPAP last night for increased WOB and improved with that initially but now appears to be worsening again with labored respirations and complaints of breathlessness. He has taken the BiPAP off.   He is alert and oriented, sitting upright, has JVD and diffuse rales, able to speak in complete sentences.   Plan to give another dose of albumin  and Lasix , encourage BiPAP use.

## 2023-06-09 NOTE — Consult Note (Addendum)
 Advanced Heart Failure Team Consult Note   Primary Physician: Tobie Suzzane POUR, MD Cardiologist:  Alvan Carrier, MD  Reason for Consultation: acute systolic heart failure  HPI:    [name] [name] is seen today for evaluation of acute systolic heart failure at the request of Dr. Arlinda, CCM.   73  y/o male w/ prior h/o HFrEF>>HFimpEF, CAD, chronic afib on coumadin , CKD IV, COPD, HTN and PAD. Had remote cath done at [facility]. Has known CTO of RCA and underwent previous stenting to OM1. EF in 2017 was down to 35-40% but normalized on subsequent echos. Echo in 2019 EF up to 50%, 2021 50-55%.   Pt initially presented to [facility] on 1/29 w/ complaints of worsening dyspnea, dry cough and wheezing. Was in respiratory distress and initially required BiPAP. Also acidotic w/ LA of 4.8 and in afib w/ RVR. Admit sCr was 4.4 c/w baseline.  Initially felt to be 2/2 septic shock 2/2 suspected PNA + suspected COPDE.  Admit BNP 499. CXR showed cardiomegaly and mild pulmonary edema. Initial BP was noted to be elevated at 174/64 but he was given IVFs w/ 1L LR bolus and started on abx and IV solumedrol.   Next day, he developed worsening hypoxic respiratory failure, was intubated and transferred to [facility] for further management he was given 1 dose of IV Lasix  w/ improvement and ultimately extubated next day on 1/31 and transferred out of the ICU and continued on treatment for PNA and COPD.   Had not received any further doses of Lasix , since 1/30. Has had gradual increase in SCr, now on up to 5.74.   On 2/5, he developed worsening SOB w/ increased WOB. Placed back on Bipap. CXR demonstrated s/o pulmonary venous hypertension, mild edema and small b/l pleural effusions. He was restarted back on IV Lasix  but only had 1.2L in UOP yesterday. Transferred back to ICU yesterday. Echo was done and shows systolic heart failure, LVEF 35-40%, diffuse HK though worse in the lateral and inferior walls, mild-mod MR,  IVC dilated assumed RAP >15. AHF team asked to assist w/ HF management.   Pt now on Bipap w/ increased WOB. Denies CP. SBPs 140s-150s. Warm on exam. +marked JVD.     Echo 06/08/23  1. Hypokinesis worse in the lateral and inferior walls . Left ventricular  ejection fraction, by estimation, is 35 to 40%. The left ventricle has  moderately decreased function. The left ventricle demonstrates global  hypokinesis. There is mild left  ventricular hypertrophy. Left ventricular diastolic parameters are  indeterminate.   2. Right ventricular systolic function is normal. The right ventricular  size is normal.   3. Left atrial size was severely dilated.   4. Right atrial size was severely dilated.   5. Mild to moderate mitral valve regurgitation.   6. Tricuspid valve regurgitation is mild to moderate.   7. The aortic valve is tricuspid. Aortic valve regurgitation is not  visualized. Aortic valve sclerosis/calcification is present, without any  evidence of aortic stenosis.   8. The inferior vena cava is dilated in size with <50% respiratory  variability, suggesting right atrial pressure of 15 mmHg.   Home Medications Prior to Admission medications   Medication Sig Start Date End Date Taking? Authorizing Provider  acetaminophen  (TYLENOL ) 500 MG tablet Take 500 mg by mouth every 6 (six) hours as needed for mild pain (pain score 1-3) or moderate pain (pain score 4-6).   Yes [provider]  albuterol  (PROVENTIL ) (2.5  MG/3ML) 0.083% nebulizer solution USE 1 VIAL IN NEBULIZER EVERY 4 HOURS AS NEEDED FOR WHEEZING OR SHORTNESS OF BREATH Patient taking differently: Take 2.5 mg by nebulization every 4 (four) hours as needed for wheezing or shortness of breath. USE 1 VIAL IN NEBULIZER EVERY 4 HOURS AS NEEDED FOR WHEEZING OR SHORTNESS OF BREATH 11/24/21  Yes Tobie Suzzane POUR, MD  albuterol  (VENTOLIN  HFA) 108 (90 Base) MCG/ACT inhaler INHALE 1 TO 2 PUFFS BY MOUTH EVERY 6 HOURS AS NEEDED FOR WHEEZING OR  SHORTNESS OF BREATH Patient taking differently: Inhale 1-2 puffs into the lungs every 6 (six) hours as needed for wheezing or shortness of breath. 04/18/23  Yes Tobie Suzzane POUR, MD  amLODipine  (NORVASC ) 10 MG tablet Take 1 tablet (10 mg total) by mouth daily. Patient taking differently: Take 10 mg by mouth in the morning. 10/12/22  Yes Tobie Suzzane POUR, MD  atorvastatin  (LIPITOR ) 20 MG tablet Take 1 tablet by mouth once daily Patient taking differently: Take 20 mg by mouth in the morning. 05/12/23  Yes Tobie Suzzane POUR, MD  calcitRIOL  (ROCALTROL ) 0.25 MCG capsule Take 1 capsule (0.25 mcg total) by mouth daily. Patient taking differently: Take 0.25 mcg by mouth in the morning. 03/25/23  Yes Tobie Suzzane POUR, MD  fluticasone  (FLONASE ) 50 MCG/ACT nasal spray Place 1 spray into both nostrils daily as needed for allergies or rhinitis.   Yes [provider]  Fluticasone -Umeclidin-Vilant (TRELEGY ELLIPTA ) 100-62.5-25 MCG/ACT AEPB Inhale 1 puff into the lungs daily. 02/01/23  Yes Patel, Suzzane POUR, MD  furosemide  (LASIX ) 40 MG tablet TAKE 1/2 TO 1 (ONE-HALF TO ONE) TABLET BY MOUTH ONCE DAILY ALTERNATING  BETWEEN  1/2  ONE  DAY  AND  1  TABLET  THE  NEXT Patient taking differently: Take 20-40 mg by mouth in the morning. Take 20mg  by mouth one day and then take 40mg  by mouth the next day. Continue to alternate daily. 04/11/23  Yes Branch, Dorn FALCON, MD  hydrALAZINE  (APRESOLINE ) 100 MG tablet TAKE 1 TABLET BY MOUTH THREE TIMES DAILY Patient taking differently: Take 100-200 mg by mouth 2 (two) times daily. Take 200mg  by mouth every morning and then 100mg  by mouth in the evening. 10/22/22  Yes Branch, Dorn FALCON, MD  metoprolol  succinate (TOPROL -XL) 100 MG 24 hr tablet TAKE 1.5 TABLETS BY MOUTH ONCE DAILY Patient taking differently: Take 150 mg by mouth in the morning. TAKE 1.5 TABLETS BY MOUTH ONCE DAILY 10/22/22  Yes Branch, Dorn FALCON, MD  pantoprazole  (PROTONIX ) 40 MG tablet Take 1 tablet (40 mg total) by mouth  daily. Patient taking differently: Take 40 mg by mouth in the morning. 10/12/22  Yes Tobie Suzzane POUR, MD  sodium bicarbonate  650 MG tablet Take 650 mg by mouth 3 (three) times daily. 04/15/23  Yes [provider]  tamsulosin  (FLOMAX ) 0.4 MG CAPS capsule Take 1 capsule (0.4 mg total) by mouth daily after supper. Patient taking differently: Take 0.4 mg by mouth at bedtime. 10/12/22  Yes Tobie Suzzane POUR, MD  warfarin (COUMADIN ) 5 MG tablet TAKE 1 TO 1 & 1/2 (ONE & ONE-HALF) TABLETS BY MOUTH ONCE DAILY AS DIRECTED BY THE COUMADIN  CLINIC Patient taking differently: Take 5-7.5 mg by mouth at bedtime. Take 1 tablet by mouth everyday except Wednesday and Sunday. Take 1.5 tablets by mouth on Wednesday and Sunday. 03/01/23  Yes BranchDorn FALCON, MD  cholecalciferol (VITAMIN D3) 25 MCG (1000 UNIT) tablet Take 1,000 Units by mouth in the morning. Patient not taking: Reported on 06/02/2023  [provider]  NIFEdipine  (ADALAT  CC) 30 MG 24 hr tablet Take 30 mg by mouth daily. Patient not taking: Reported on 06/02/2023 05/30/23   [provider]  nitroGLYCERIN  (NITROSTAT ) 0.4 MG SL tablet place 1 tablet under the tongue if needed every 5 minutes for chest pain for 3 doses IF NO RELIEF AFTER 3RD DOSE CALL PRESCRIBER OR 911. Patient not taking: Reported on 06/02/2023 04/21/23   Tobie Suzzane POUR, MD  traZODone  (DESYREL ) 50 MG tablet Take 0.5-1 tablets (25-50 mg total) by mouth at bedtime as needed for sleep. Patient not taking: Reported on 06/02/2023 04/21/23   Tobie Suzzane POUR, MD    Past Medical History: Past Medical History:  Diagnosis Date   AAA (abdominal aortic aneurysm) (HCC)    a. s/p repair 06/2015 with left renal artery bypass at that time, post-op course c/b renal failure requiring dialysis, C dif.   Anemia    Arteriosclerotic cardiovascular disease (ASCVD)    a. AMI in 2000 treated at [facility]. b. cath in 12/2006->  Chronic total obstruction of the RCA;  drug-eluting stent placed  in OM1, LVEF abnormal.   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   PVD (peripheral vascular disease) (HCC)    Ct angiogram in 2009 revealed stable disease with 80% celiac stenosis,50% right renal artery ,ASCVD with ulceration in the abdominal aortashe   Testicular carcinoma (HCC) 1990   right orchiectomy   Tobacco abuse, in remission    20 pack years; quit in 2009    Past Surgical History: Past Surgical History:  Procedure Laterality Date   ABDOMINAL AORTIC ANEURYSM REPAIR N/A 06/09/2015   Procedure: ANEURYSM ABDOMINAL AORTIC REPAIR;  Surgeon: Carlin FORBES Haddock, MD;  Location: [facility] OR;  Service: Vascular;  Laterality: N/A;   AORTIC ENDARTERECETOMY N/A 06/09/2015   Procedure: AORTIC ENDARTERECETOMY;  Surgeon: Carlin FORBES Haddock, MD;  Location: [facility] OR;  Service: Vascular;  Laterality: N/A;   AORTIC/RENAL BYPASS Left 06/09/2015   Procedure: LEFT RENAL Artery BYPASS;  Surgeon: Carlin FORBES Haddock, MD;  Location: [facility] OR;  Service: Vascular;  Laterality: Left;   APPENDECTOMY  2004   CHOLECYSTECTOMY N/A 09/18/2018   Procedure: LAPAROSCOPIC CHOLECYSTECTOMY;  Surgeon: Mavis Anes, MD;  Location: [facility] ORS;  Service: General;  Laterality: N/A;   COLONOSCOPY  06/17/2011   INCOMPLETE, PREP POOR. Procedure: COLONOSCOPY;  Surgeon: Lamar CHRISTELLA Hollingshead, MD;  Location: [facility] ENDO SUITE;  Service: Endoscopy;  Laterality: N/A;  10:00   COLONOSCOPY  07/15/2011   MFM:floupeoz rectal and colon polyps   COLONOSCOPY N/A 05/22/2015    MFM:floupeoz colonic and rectal polyps. tubular adenomas.repeat TCS 05/2018   ESOPHAGEAL DILATION N/A 05/22/2015   Procedure: ESOPHAGEAL DILATION;  Surgeon: Lamar CHRISTELLA Hollingshead, MD;  Location: [facility] ENDO SUITE;  Service: Endoscopy;  Laterality: N/A;   ESOPHAGOGASTRODUODENOSCOPY  06/17/2011   severe erosive/ulcerative reflux esophagitis, soft noncritical stricture dilatied, small hh, antral erosion    ESOPHAGOGASTRODUODENOSCOPY N/A 05/22/2015   RMR: 2 cm HH otherwise normal. s/p empirical  dilation.   INSERTION OF DIALYSIS CATHETER Left 06/26/2015   Procedure: INSERTION OF DIALYSIS CATHETER;  Surgeon: Lonni GORMAN Blade, MD;  Location: [facility] OR;  Service: Vascular;  Laterality: Left;   PERIPHERAL VASCULAR CATHETERIZATION N/A 05/28/2015   Procedure: Abdominal Aortogram;  Surgeon: Gaile LELON New, MD;  Location: [facility] INVASIVE CV LAB;  Service: Cardiovascular;  Laterality: N/A;   testicular cancer  1990   right orchiectomy    Family History: Family History  Problem Relation Age of Onset   Colon cancer Father 66       deceased   Prostate cancer Father    Heart disease Mother    Liver disease Neg Hx    Anesthesia problems Neg Hx    Hypotension Neg Hx    Malignant hyperthermia Neg Hx    Pseudochol deficiency Neg Hx     Social History: Social History   Socioeconomic History   Marital status: Married    Spouse name: Not on file   Number of children: 2   Years of education: Not on file   Highest education level: Not on file  Occupational History   Occupation: disabled    Employer: UNEMPLOYED  Tobacco Use   Smoking status: Every Day    Current packs/day: 0.50    Average packs/day: 0.5 packs/day for 41.4 years (20.7 ttl pk-yrs)    Types: Cigarettes    Start date: 01/04/1982   Smokeless tobacco: Never   Tobacco comments:    1/2 pack daily  Vaping Use   Vaping status: Never Used  Substance and Sexual Activity   Alcohol  use: No    Alcohol /week: 0.0 standard drinks of alcohol    Drug use: No    Sexual activity: Yes    Birth control/protection: None  Other Topics Concern   Not on file  Social History Narrative   Not on file   Social Drivers of Health   Financial Resource Strain: Low Risk  (04/20/2023)   Overall Financial Resource Strain (CARDIA)    Difficulty of Paying Living Expenses: Not hard at all  Food Insecurity: No Food Insecurity (06/04/2023)   Hunger Vital Sign    Worried About Running Out of Food in the Last Year: Never true    Ran Out of Food in the Last Year: Never true  Transportation Needs: Patient Unable To Answer (06/02/2023)   PRAPARE - Transportation    Lack of Transportation (Medical): Patient unable to answer    Lack of Transportation (Non-Medical): Patient unable to answer  Physical Activity: Insufficiently Active (04/20/2023)   Exercise Vital Sign    Days of Exercise per Week: 3 days    Minutes of Exercise per Session: 30 min  Stress: No Stress Concern Present (04/20/2023)   [employer] of Occupational Health - Occupational Stress Questionnaire    Feeling of Stress : Not at all  Social Connections: Patient Unable To Answer (06/02/2023)   Social Connection and Isolation Panel [NHANES]    Frequency of Communication with Friends and Family: Patient unable to answer    Frequency of Social Gatherings with Friends and Family: Patient unable to answer    Attends Religious Services: Patient unable to answer    Active Member of Clubs or Organizations: Patient unable to answer    Attends Banker Meetings: Patient unable to answer    Marital Status: Patient unable to answer    Allergies:  No Known Allergies  Objective:    Vital Signs:   Temp:  [97.5 F (36.4 C)-99.9 F (  37.7 C)] 97.7 F (36.5 C) (02/06 0726) Pulse Rate:  [80-114] 80 (02/06 1000) Resp:  [19-34] 19 (02/06 1000) BP: (148-176)/(68-90) 148/85 (02/06 1000) SpO2:  [94 %-100 %] 100 % (02/06 1000) FiO2 (%):  [40 %-50 %] 50 % (02/06 0837) Weight:  [77.2 kg] 77.2 kg (02/06  0500) Last BM Date : 06/09/23  Weight change: Filed Weights   06/07/23 0500 06/08/23 0520 06/09/23 0500  Weight: 80.8 kg 81.3 kg 77.2 kg    Intake/Output:   Intake/Output Summary (Last 24 hours) at 06/09/2023 1107 Last data filed at 06/09/2023 0514 Gross per 24 hour  Intake 274.57 ml  Output 1150 ml  Net -875.43 ml      Physical Exam    General:  chronically ill appearing, looks much older than age, on BiPAP w/ increased WOB.  HEENT: normal Neck: supple. Distended neck veins, JVD to jaw . Carotids 2+ bilat; no bruits. No lymphadenopathy or thyromegaly appreciated. Cor: PMI nondisplaced. Irregularly irregular rhythm and rate. No rubs, gallops or murmurs. Lungs: decreased BS at the bases w/ faint bibasilar crackles  Abdomen: soft, nontender, +distended. No hepatosplenomegaly. No bruits or masses. Good bowel sounds. Extremities: no cyanosis, clubbing, rash, 1+ b/l LE edema, distal extremities warm  Neuro: alert & orientedx3, cranial nerves grossly intact. moves all 4 extremities w/o difficulty. Affect flat    Telemetry   Atrial fibrillation, 70s. Personally reviewed   EKG    Admit EKG afib 79 bpm, personally reviewed   Labs   Basic Metabolic Panel: Recent Labs  Lab 06/03/23 0516 06/03/23 1040 06/03/23 1641 06/03/23 1834 06/04/23 9662 06/04/23 1653 06/05/23 0920 06/06/23 0918 06/07/23 0509 06/08/23 0457  NA 135 142  --    < > 137  --  142 146* 143 144  K 4.9 4.7  --    < > 4.7  --  4.5 4.8 5.2* 4.9  CL 104 105  --    < > 103  --  106 112* 110 109  CO2 19* 20*  --    < > 17*  --  20* 18* 18* 20*  GLUCOSE 103* 95  --    < > 122*  --  160* 147* 115* 107*  BUN 68* 72*  --    < > 83*  --  96* 102* 105* 100*  CREATININE 5.08* 5.28*  --    < > 5.43*  --  5.65* 5.59* 5.63* 5.25*  CALCIUM  9.5 10.0  --    < > 9.6  --  9.8 10.2 10.0 10.0  MG 2.5* 2.6* 2.5*  --  2.5* 2.4  --   --  2.5*  --   PHOS 6.7* 7.0* 6.1*  --  5.2*  --   --   --   --  5.6*   < > = values in this  interval not displayed.    Liver Function Tests: Recent Labs  Lab 06/08/23 0457  ALBUMIN  2.9*   No results for input(s): LIPASE, AMYLASE in the last 168 hours. No results for input(s): AMMONIA in the last 168 hours.  CBC: Recent Labs  Lab 06/04/23 0337 06/05/23 0920 06/06/23 0918 06/07/23 0509 06/09/23 1000  WBC 14.3* 10.8* 11.3* 10.2 9.9  NEUTROABS  --   --   --   --  8.7*  HGB 8.7* 8.9* 8.5* 8.0* 7.6*  HCT 26.5* 27.3* 26.5* 25.1* 24.5*  MCV 94.6 93.5 95.7 95.4 98.8  PLT 132* 151 140* 134* 113*    Cardiac Enzymes:  No results for input(s): CKTOTAL, CKMB, CKMBINDEX, TROPONINI in the last 168 hours.  BNP: BNP (last 3 results) Recent Labs    06/01/23 2038  BNP 499.0*    ProBNP (last 3 results) No results for input(s): PROBNP in the last 8760 hours.   CBG: Recent Labs  Lab 06/03/23 2029 06/04/23 0017 06/04/23 0416 06/09/23 0723 06/09/23 0929  GLUCAP 135* 138* 122* 111* 120*    Coagulation Studies: Recent Labs    06/07/23 0509 06/08/23 0457 06/09/23 1000  LABPROT 22.3* 24.9* 25.6*  INR 1.9* 2.2* 2.3*     Imaging   DG CHEST PORT 1 VIEW Result Date: 06/09/2023 CLINICAL DATA:  10026 Shortness of breath 10026 EXAM: PORTABLE CHEST 1 VIEW COMPARISON:  CXR 06/08/23 FINDINGS: Unchanged small left pleural effusion and improved right-sided pleural effusion. No pneumothorax. Cardiomegaly. Prominent bilateral interstitial opacities could represent pulmonary venous congestion or mild pulmonary edema. No focal airspace opacity. No radiographically apparent displaced rib fractures. Visualized upper abdomen unremarkable. IMPRESSION: Cardiomegaly with improved right-sided pleural effusion and unchanged left-sided pleural effusion no focal airspace opacity Electronically Signed   By: Lyndall Gore M.D.   On: 06/09/2023 08:33   DG CHEST PORT 1 VIEW Result Date: 06/08/2023 CLINICAL DATA:  Shortness of breath EXAM: PORTABLE CHEST 1 VIEW COMPARISON:  06/03/2023  FINDINGS: Previously seen endotracheal tube and nasogastric tube no longer present. Chronic cardiomegaly. Coronary artery stents. Aortic atherosclerotic calcification. Pulmonary venous hypertension without frank edema. Small bilateral pleural effusions. Findings consistent with low-grade congestive heart failure. IMPRESSION: Low-grade congestive heart failure. Electronically Signed   By: Oneil Officer M.D.   On: 06/08/2023 16:15   ECHOCARDIOGRAM COMPLETE Result Date: 06/08/2023    ECHOCARDIOGRAM REPORT   Patient Name:   [name] [name] Date of Exam: 06/08/2023 Medical Rec #:  [id number]       Height:       72.0 in Accession #:    [id number]      Weight:       179.2 lb Date of Birth:  11/07/1950       BSA:          2.033 m Patient Age:    72 years        BP:           180/71 mmHg Patient Gender: M               HR:           87 bpm. Exam Location:  Inpatient Procedure: 2D Echo, Cardiac Doppler and Color Doppler  History:        Patient has prior history of Echocardiogram examinations, most                 recent 08/19/2017. CHF, CAD, COPD, Arrythmias:Atrial                 Fibrillation, Signs/Symptoms:Shortness of Breath; Risk                 Factors:Hypertension. ESRD.  Sonographer:    Lanell Maduro Referring Phys: [id number] KUBER GHIMIRE IMPRESSIONS  1. HYpokinesis worse in the lateral and inferior walls . Left ventricular ejection fraction, by estimation, is 35 to 40%. The left ventricle has moderately decreased function. The left ventricle demonstrates global hypokinesis. There is mild left ventricular hypertrophy. Left ventricular diastolic parameters are indeterminate.  2. Right ventricular systolic function is normal. The right ventricular size is normal.  3. Left atrial size was severely dilated.  4. Right atrial size  was severely dilated.  5. Mild to moderate mitral valve regurgitation.  6. Tricuspid valve regurgitation is mild to moderate.  7. The aortic valve is tricuspid. Aortic valve regurgitation is not  visualized. Aortic valve sclerosis/calcification is present, without any evidence of aortic stenosis.  8. The inferior vena cava is dilated in size with <50% respiratory variability, suggesting right atrial pressure of 15 mmHg. Comparison(s): The left ventricular function is worsened. FINDINGS  Left Ventricle: HYpokinesis worse in the lateral and inferior walls. Left ventricular ejection fraction, by estimation, is 35 to 40%. The left ventricle has moderately decreased function. The left ventricle demonstrates global hypokinesis. The left ventricular internal cavity size was normal in size. There is mild left ventricular hypertrophy. Left ventricular diastolic parameters are indeterminate. Right Ventricle: The right ventricular size is normal. Right vetricular wall thickness was not assessed. Right ventricular systolic function is normal. Left Atrium: Left atrial size was severely dilated. Right Atrium: Right atrial size was severely dilated. Pericardium: Trivial pericardial effusion is present. Mitral Valve: Mild mitral annular calcification. Mild to moderate mitral valve regurgitation. Tricuspid Valve: The tricuspid valve is normal in structure. Tricuspid valve regurgitation is mild to moderate. Aortic Valve: The aortic valve is tricuspid. Aortic valve regurgitation is not visualized. Aortic valve sclerosis/calcification is present, without any evidence of aortic stenosis. Pulmonic Valve: The pulmonic valve was grossly normal. Pulmonic valve regurgitation is trivial. Aorta: The aortic root and ascending aorta are structurally normal, with no evidence of dilitation. Venous: The inferior vena cava is dilated in size with less than 50% respiratory variability, suggesting right atrial pressure of 15 mmHg. IAS/Shunts: No atrial level shunt detected by color flow Doppler.  LEFT VENTRICLE PLAX 2D LVIDd:         5.30 cm      Diastology LVIDs:         4.30 cm      LV e' medial:    5.11 cm/s LV PW:         1.30 cm      LV  E/e' medial:  33.9 LV IVS:        0.90 cm      LV e' lateral:   8.38 cm/s LVOT diam:     2.30 cm      LV E/e' lateral: 20.6 LV SV:         66 LV SV Index:   33 LVOT Area:     4.15 cm  LV Volumes (MOD) LV vol d, MOD A2C: 117.0 ml LV vol d, MOD A4C: 75.0 ml LV vol s, MOD A2C: 59.6 ml LV vol s, MOD A4C: 49.2 ml LV SV MOD A2C:     57.4 ml LV SV MOD A4C:     75.0 ml LV SV MOD BP:      38.4 ml RIGHT VENTRICLE             IVC RV Basal diam:  2.80 cm     IVC diam: 2.90 cm RV S prime:     10.30 cm/s TAPSE (M-mode): 1.7 cm LEFT ATRIUM              Index        RIGHT ATRIUM           Index LA diam:        5.90 cm  2.90 cm/m   RA Area:     32.80 cm LA Vol (A2C):   151.0 ml 74.26 ml/m  RA Volume:   118.00 ml  58.03 ml/m LA Vol (A4C):   101.0 ml 49.67 ml/m LA Biplane Vol: 124.0 ml 60.98 ml/m  AORTIC VALVE LVOT Vmax:   95.25 cm/s LVOT Vmean:  60.350 cm/s LVOT VTI:    0.160 m  AORTA Ao Root diam: 3.30 cm Ao Asc diam:  3.70 cm MITRAL VALVE                TRICUSPID VALVE MV Area (PHT): 3.67 cm     TR Peak grad:   30.0 mmHg MR Peak grad: 121.9 mmHg    TR Vmax:        274.00 cm/s MR Mean grad: 82.0 mmHg MR Vmax:      552.00 cm/s   SHUNTS MR Vmean:     433.0 cm/s    Systemic VTI:  0.16 m MV E velocity: 173.00 cm/s  Systemic Diam: 2.30 cm Vina Gull MD Electronically signed by Vina Gull MD Signature Date/Time: 06/08/2023/3:39:02 PM    Final      Medications:     Current Medications:  arformoterol   15 mcg Nebulization BID   atorvastatin   20 mg Oral Daily   budesonide  (PULMICORT ) nebulizer solution  0.25 mg Nebulization BID   Chlorhexidine  Gluconate Cloth  6 each Topical Daily   docusate  100 mg Per Tube BID   etomidate        [START ON 06/10/2023] famotidine   10 mg Per Tube Daily   feeding supplement  237 mL Oral TID BM   fentaNYL        ketamine  HCl       methylPREDNISolone  (SOLU-MEDROL ) injection  40 mg Intravenous Q12H   metolazone   5 mg Oral Once   midazolam        multivitamin with minerals  1 tablet Oral Daily    polyethylene glycol  17 g Oral Daily   revefenacin   175 mcg Nebulization Daily   rocuronium        sodium bicarbonate   650 mg Oral TID   succinylcholine        tamsulosin   0.4 mg Oral Daily    Infusions:  fentaNYL  infusion INTRAVENOUS Stopped (06/09/23 1106)   furosemide      furosemide  (LASIX ) 200 mg in dextrose  5 % 100 mL (2 mg/mL) infusion     iron  sucrose 440 mL/hr at 06/09/23 0514      Patient Profile   73  y/o male w/ prior h/o HFrEF>>HFimpEF, CAD, chronic afib on coumadin , CKD IV, COPD, HTN and PAD admitted w/ acute hypoxic respiratory failure and lactic acidosis, intially felt to be septic shock due to PNA. Also felt to have COPDE. Initially treated w/ IVF resuscitation, abx and steroids. Diuretics held. Initially improved but transferred back to ICU w/ recurrent hypoxic respiratory failure/ CHF in setting of worsening renal failure. Echo shows drop in EF 35-40%, RV ok.   Assessment/Plan   1. Acute Hypoxic Respiratory Failure - multifactorial, treating for PNA + COPDE but suspect now predominantly due to CHF - agree w/ staring lasix  gtt at 15/hr  - if unable to effectively diuresis, may be nearing HD - continue abx and steroids per CCM  - on BiPAP, may need re-intubation   2. Acute Systolic Heart Failure - H/o HFrEF>>HFimpEF. EF in 2017 was down to 35-40% but normalized on subsequent echos. Echo in 2019 EF up to 50%, 2021 50-55%.  - Echo this admit, EF 35-40%, RV ok, IVC dilated assumed RAP ~15, lat/infer wall HK - NYHA Class IIIIb-IV confounded by COPD + PNA. Volume overloaded on exam -  start lasix  gtt 15/hr - recommend CVC to allow for co-ox monitoring/CVP assessment. May need inotropic support - repeat LA level (4.8 on admit) and check HFTs - GDMT limited by CKD - w/ elevated SBPs, recommend IV hydralazine  (unable to take PO currently w/ BiPAP) but avoid SBPs <110 to help w/ renal perfusion  - once respiratory status more stable, will need RHC - etiology for low EF  uncertain. ICM possibility but not candidate for LHC given renal fx. Can later persue if he ends up on permeant HD   3. AKI on CKD IV - Admit SCr 4.4 c/w baseline - Gradual rise in SCr now up to 5.74  - suspect cardiorenal - begin lasix  gtt for diuresis/ renal venous decongestion and follow response - recommend CVC to allow for co-ox monitoring/CVP assessment. May need inotropic support - nephrology following   4. Permanent Atrial Fibrillation  - rates currently controlled, 70s - on warfarin PTA, INR 2.3 today - covering w/ heparin  gtt while acutely ill  5. Hyperkalemia - in setting of AKI - 5.7>>IV Lasix  ordered - follow labs. May need HD  6. Anemia - Hgb 7.6 - likely anemia of chronic disease from CKD - per CCM and nephrology   CRITICAL CARE Performed by: Caffie Marcine RIGGERS   Total critical care time: 20 minutes  Critical care time was exclusive of separately billable procedures and treating other patients.  Critical care was necessary to treat or prevent imminent or life-threatening deterioration.  Critical care was time spent personally by me on the following activities: development of treatment plan with patient and/or surrogate as well as nursing, discussions with consultants, evaluation of patient's response to treatment, examination of patient, obtaining history from patient or surrogate, ordering and performing treatments and interventions, ordering and review of laboratory studies, ordering and review of radiographic studies, pulse oximetry and re-evaluation of patient's condition.     Length of Stay: [address], PA-C  06/09/2023, 11:07 AM  Advanced Heart Failure Team Pager [phone] (M-F; 7a - 5p)  Please contact [facility] for night-coverage after hours (4p -7a ) and weekends on [email or web address]

## 2023-06-09 NOTE — Progress Notes (Signed)
 Patient is requesting a breathing treatment. He is labored with his breathing and complaining he is can't catch his breath. He has been given an order for breathing treatment and IV lasix . Will continue to monitor patient

## 2023-06-09 NOTE — Progress Notes (Signed)
 OT Cancellation Note  Patient Details Name: [name] [name] MRN: [id number] DOB: 1950/06/29   Cancelled Treatment:    Reason Eval/Treat Not Completed: Medical issues which prohibited therapy Pt with tenuous respiratory status, placed on BiPAP with transfer to ICU. Will hold any therapy attempts today and check back at a later date.   [name]  [name] 06/09/2023, 12:21 PM

## 2023-06-09 NOTE — Progress Notes (Addendum)
 NAME:  [name] [name], MRN:  [id number], DOB:  April 10, 1951, LOS: 8 ADMISSION DATE:  06/01/2023, CONSULTATION DATE:  06/02/2023 REFERRING MD:  Dr. Lawence CHIEF COMPLAINT:  Shortness of Breath   History of Present Illness:  [name] [name] is a 73 y.o. male with a PMH significant for HTN, HLD, AAA s/p repair, MI, CAD, Combined systolic/diastolic HF, A-Fib on coumadin , Stroke, PVD, COPD, Tobacco use, CKD stage IV, GERD, who presented to [facility] ER 1/29 with acute onset worsening dyspnea, associated dry cough, and wheezing.  Per chart review, he denied fever, chills, nausea, vomiting. Upon arrival he was in tachycardic, tachypneic, BP 174/64, and placed on Bipap. Labs revealed ABG PH 7.41, HCO3 17.1, Lacitc 4.8->4.6, INR 3.5, RVP negative, BUN 47, creat 4.4, Hgb 9.2. EKG with Afib RVR rate 119. A chest xray was obtained showing mild cardiomegaly, mild edema, and possible atypical pneumonia. Patient was started on Ceftriaxone  and Zithromax , given 125 mg Solu-Medrol  and 2.5 liter IVF bolus. He was planned to be admitted to the step down unit, however, with worsening hypoxia patient was intubated 1/30 and transferred to [facility] ICU for further management.   Pertinent  Medical History  HTN/HLD Afib on Coumadin  MI/CAD COPD Tobacco Use CKD stage IV  Significant Hospital Events: Including procedures, antibiotic start and stop dates in addition to other pertinent events   1/29: Acute respiratory on Bipap, requiring intubation. COPD exacerbation. Suspected PNA. Ceftriaxone /Zithromax /Steroids/Nebs 1/31 Weaned, extubated   Interim History / Subjective:   Overnight w/ increase wob placed on bipap and given albumin /lasix . UOP last 24 hours 1.1L. This am still on bipap with increase wob; RR 30-40s; difficulty speaking w/ wob  CXR today no significant change from yesterday; small left pleural effusion  Objective   Blood pressure (!) 166/71, pulse 94, temperature 97.7 F (36.5 C), temperature source Axillary,  resp. rate (!) 24, height 6' (1.829 m), weight 77.2 kg, SpO2 99%.    FiO2 (%):  [40 %] 40 % PEEP:  [5 cmH20] 5 cmH20   Intake/Output Summary (Last 24 hours) at 06/09/2023 0818 Last data filed at 06/09/2023 9485 Gross per 24 hour  Intake 274.57 ml  Output 1150 ml  Net -875.43 ml   Filed Weights   06/07/23 0500 06/08/23 0520 06/09/23 0500  Weight: 80.8 kg 81.3 kg 77.2 kg    General:   ill appearing male w/ resp distress HEENT: MM pink/moist; bipap in place Neuro: AO; MAE CV: s1s2, afib, no m/r/g PULM:  dim BS bilaterally; bipap 40% GI: soft, bsx4 active  Extremities: warm/dry, ble edema  Skin: no rashes or lesions    Resolved Hospital Problem list   None  Assessment & Plan:  Acute Hypoxic Respiratory Failure due to acute systolic chf and COPD exacerbation and suspected PNA COPD with Acute Exacerbation Tobacco Use Intubated 1/30 for Hypoxia, although on review of vitals I do not see any documented hypoxia. Received CAP Coverage, steroids, nebs prior to arrival.  Negative for Covid, flu, RSV No growth in BC to date P: -transfer to icu for intubation -LTVV strategy with tidal volumes of 6-8 cc/kg ideal body weight -check ABG and adjust settings accordingly -Wean PEEP/FiO2 for SpO2 >90% -VAP bundle in place -Daily SAT and SBT -PAD protocol in place -wean sedation for RASS goal 0 to -1 -nephro giving challenge of lasix /albumin ; may need HD cath for dialysis if not improving -triple therapy nebs -iv steroids -completed course of rocephin /azithro on 2/3; hold further abx and trend wbc/fever curve -wife and patient  both state they are okay with temporary trial of intubation; if unable to come off vent would not want to purse tracheostomy and would want comfort  Severe Sepsis due to suspected Atypical Pneumonia CXR certainly with RLL pneumonia but query developing multifocal pneumonia. Procal 0.44. lactic peak at 4.8 and has cleared. UA without infection. No GI symptoms.  No  growth in BC to date P: -completed course of rocephin /azithro on 2/3 -trend wbc/fever curve  AKI on CKD stage IV Anemia of Chronic Kidney Disease Hyperkalemia  Hypomagnesemia  Urinary retention Making urine. K 5.3 on admit. Starting lokelma   K trending down -Follows with Dr. Rachele, appears patient is planning to start dialysis soon (possibly peritoneal) P: -nephro following -place foley and giving lasix /albumin  challenge -may need HD if not improving -cont bicarb tabs -Trend BMP / urinary output -Replace electrolytes as indicated -Avoid nephrotoxic agents, ensure adequate renal perfusion  Paroxysmal atrial fibrillation with RVR  Combined Systolic/Diastolic HF (echo 08/2019 50-55%) Essential HTN HLD BNP 499. In afib but rate controlled currently. On coumadin  at home. P: -switch eliquis  to heparin  -hold po anti-htn meds for now; consider resuming tomorrow -prn hydralazine  for htn -statin  Anemia of CKD Plan: -trend cbc  At risk for protein calorie malnutrition  Did have OG output, bile with LIS Patient extubated without any issues P: -place og post intubation -RD consult for TF  Best Practice (right click and Reselect all SmartList Selections daily)   Diet/type: NPO; TF later today DVT prophylaxis systemic heparin  Pressure ulcer(s): None GI prophylaxis: H2B Lines: N/A Foley:  Yes, and it is still needed Code Status:  DNR Family update Patient updated at beside 2/6 wife and patient updated at bedside. Both agreed dnr but okay for intubation. Would not want to be on vent/trach or dialysis long term. Consent obtained for intubation and dialysis catheter placement.   CC: 45 mins   JD Emilio RIGGERS [facility] 06/09/2023, 9:18 AM  Please see [email or web address] for pager details.  From 7A-7P if no response, please call [phone]. After hours, please call ELink [phone].

## 2023-06-09 NOTE — Progress Notes (Signed)
 MEWS Progress Note  Patient Details Name: [name] [name] MRN: [id number] DOB: [birth year] Today's Date: 06/09/2023   MEWS Flowsheet Documentation:  Assess: MEWS Score Temp: 97.7 F (36.5 C) BP: (!) 166/71 MAP (mmHg): 94 Pulse Rate: 94 ECG Heart Rate: 97 Resp: (!) 34 Level of Consciousness: Alert SpO2: 99 % O2 Device: Bi-PAP O2 Flow Rate (L/min): 2 L/min FiO2 (%): 40 % Assess: MEWS Score MEWS Temp: 0 MEWS Systolic: 0 MEWS Pulse: 0 MEWS RR: 2 MEWS LOC: 0 MEWS Score: 2 MEWS Score Color: Yellow Assess: SIRS CRITERIA SIRS Temperature : 0 SIRS Respirations : 1 SIRS Pulse: 1 SIRS WBC: 0 SIRS Score Sum : 2 SIRS Temperature : 0 SIRS Pulse: 1 SIRS Respirations : 1 SIRS WBC: 0 SIRS Score Sum : 2 Assess: if the MEWS score is Yellow or Red Were vital signs accurate and taken at a resting state?: Yes Does the patient meet 2 or more of the SIRS criteria?: Yes Does the patient have a confirmed or suspected source of infection?: Yes MEWS guidelines implemented : Yes, yellow Treat MEWS Interventions: Considered administering scheduled or prn medications/treatments as ordered Take Vital Signs Increase Vital Sign Frequency : Yellow: Q2hr x1, continue Q4hrs until patient remains green for 12hrs Escalate MEWS: Escalate: Yellow: Discuss with charge nurse and consider notifying provider and/or RRT Notify: Charge Nurse/RN Name of Charge Nurse/RN Notified: Musician Provider Notification Provider Name/Title: [place] mD Date Provider Notified: 06/09/23 Time Provider Notified: 0750 Method of Notification: Page Notification Reason: Change in status Provider response: At bedside Date of Provider Response: 06/09/23 Time of Provider Response: 0800      [name] I [name] 06/09/2023, 8:21 AM

## 2023-06-09 NOTE — Progress Notes (Signed)
 Heart Failure Navigator Progress Note  Assessed for Heart & Vascular TOC clinic readiness.  Patient does not meet criteria due to Advanced Heart Failure Team consult. .   Navigator will sign off at this time.    Rhae Hammock, BSN, Scientist, clinical (histocompatibility and immunogenetics) Only

## 2023-06-10 ENCOUNTER — Inpatient Hospital Stay (HOSPITAL_COMMUNITY): Payer: 59

## 2023-06-10 DIAGNOSIS — J441 Chronic obstructive pulmonary disease with (acute) exacerbation: Secondary | ICD-10-CM | POA: Diagnosis not present

## 2023-06-10 DIAGNOSIS — N184 Chronic kidney disease, stage 4 (severe): Secondary | ICD-10-CM | POA: Diagnosis not present

## 2023-06-10 DIAGNOSIS — J449 Chronic obstructive pulmonary disease, unspecified: Secondary | ICD-10-CM

## 2023-06-10 DIAGNOSIS — E870 Hyperosmolality and hypernatremia: Secondary | ICD-10-CM

## 2023-06-10 DIAGNOSIS — J9601 Acute respiratory failure with hypoxia: Secondary | ICD-10-CM | POA: Diagnosis not present

## 2023-06-10 DIAGNOSIS — J189 Pneumonia, unspecified organism: Secondary | ICD-10-CM | POA: Diagnosis not present

## 2023-06-10 LAB — POCT I-STAT 7, (LYTES, BLD GAS, ICA,H+H)
Acid-base deficit: 4 mmol/L — ABNORMAL HIGH (ref 0.0–2.0)
Bicarbonate: 20.3 mmol/L (ref 20.0–28.0)
Calcium, Ion: 1.26 mmol/L (ref 1.15–1.40)
HCT: 19 % — ABNORMAL LOW (ref 39.0–52.0)
Hemoglobin: 6.5 g/dL — CL (ref 13.0–17.0)
O2 Saturation: 98 %
Potassium: 4.6 mmol/L (ref 3.5–5.1)
Sodium: 143 mmol/L (ref 135–145)
TCO2: 21 mmol/L — ABNORMAL LOW (ref 22–32)
pCO2 arterial: 34.9 mm[Hg] (ref 32–48)
pH, Arterial: 7.374 (ref 7.35–7.45)
pO2, Arterial: 103 mm[Hg] (ref 83–108)

## 2023-06-10 LAB — HEMOGLOBIN AND HEMATOCRIT, BLOOD
HCT: 25.3 % — ABNORMAL LOW (ref 39.0–52.0)
Hemoglobin: 8.4 g/dL — ABNORMAL LOW (ref 13.0–17.0)

## 2023-06-10 LAB — RENAL FUNCTION PANEL
Albumin: 2.7 g/dL — ABNORMAL LOW (ref 3.5–5.0)
Anion gap: 17 — ABNORMAL HIGH (ref 5–15)
BUN: 152 mg/dL — ABNORMAL HIGH (ref 8–23)
CO2: 20 mmol/L — ABNORMAL LOW (ref 22–32)
Calcium: 9.4 mg/dL (ref 8.9–10.3)
Chloride: 108 mmol/L (ref 98–111)
Creatinine, Ser: 6.51 mg/dL — ABNORMAL HIGH (ref 0.61–1.24)
GFR, Estimated: 9 mL/min — ABNORMAL LOW (ref >60)
Glucose, Bld: 134 mg/dL — ABNORMAL HIGH (ref 70–99)
Phosphorus: 9.8 mg/dL — ABNORMAL HIGH (ref 2.5–4.6)
Potassium: 4.8 mmol/L (ref 3.5–5.1)
Sodium: 145 mmol/L (ref 135–145)

## 2023-06-10 LAB — COMPREHENSIVE METABOLIC PANEL
ALT: 49 U/L — ABNORMAL HIGH (ref 0–44)
AST: 29 U/L (ref 15–41)
Albumin: 2.8 g/dL — ABNORMAL LOW (ref 3.5–5.0)
Alkaline Phosphatase: 44 U/L (ref 38–126)
Anion gap: 20 — ABNORMAL HIGH (ref 5–15)
BUN: 143 mg/dL — ABNORMAL HIGH (ref 8–23)
CO2: 18 mmol/L — ABNORMAL LOW (ref 22–32)
Calcium: 9.6 mg/dL (ref 8.9–10.3)
Chloride: 108 mmol/L (ref 98–111)
Creatinine, Ser: 6.22 mg/dL — ABNORMAL HIGH (ref 0.61–1.24)
GFR, Estimated: 9 mL/min — ABNORMAL LOW (ref >60)
Glucose, Bld: 120 mg/dL — ABNORMAL HIGH (ref 70–99)
Potassium: 4.9 mmol/L (ref 3.5–5.1)
Sodium: 146 mmol/L — ABNORMAL HIGH (ref 135–145)
Total Bilirubin: 1 mg/dL (ref 0.0–1.2)
Total Protein: 5.5 g/dL — ABNORMAL LOW (ref 6.5–8.1)

## 2023-06-10 LAB — APTT
aPTT: 114 s — ABNORMAL HIGH (ref 24–36)
aPTT: 110 s — ABNORMAL HIGH (ref 24–36)
aPTT: 41 s — ABNORMAL HIGH (ref 24–36)

## 2023-06-10 LAB — GLUCOSE, CAPILLARY
Glucose-Capillary: 116 mg/dL — ABNORMAL HIGH (ref 70–99)
Glucose-Capillary: 117 mg/dL — ABNORMAL HIGH (ref 70–99)
Glucose-Capillary: 119 mg/dL — ABNORMAL HIGH (ref 70–99)
Glucose-Capillary: 121 mg/dL — ABNORMAL HIGH (ref 70–99)
Glucose-Capillary: 127 mg/dL — ABNORMAL HIGH (ref 70–99)
Glucose-Capillary: 128 mg/dL — ABNORMAL HIGH (ref 70–99)

## 2023-06-10 LAB — PROCALCITONIN: Procalcitonin: 4.65 ng/mL

## 2023-06-10 LAB — COOXEMETRY PANEL
Carboxyhemoglobin: 1.3 % (ref 0.5–1.5)
Methemoglobin: <0.7 % (ref 0.0–1.5)
O2 Saturation: 78.9 %
Total hemoglobin: <6.9 g/dL — CL (ref 12.0–16.0)

## 2023-06-10 LAB — CBC
HCT: 21.9 % — ABNORMAL LOW (ref 39.0–52.0)
Hemoglobin: 6.9 g/dL — CL (ref 13.0–17.0)
MCH: 31.5 pg (ref 26.0–34.0)
MCHC: 31.5 g/dL (ref 30.0–36.0)
MCV: 100 fL (ref 80.0–100.0)
Platelets: 105 10*3/uL — ABNORMAL LOW (ref 150–400)
RBC: 2.19 MIL/uL — ABNORMAL LOW (ref 4.22–5.81)
RDW: 15.6 % — ABNORMAL HIGH (ref 11.5–15.5)
WBC: 8.4 10*3/uL (ref 4.0–10.5)
nRBC: 0.5 % — ABNORMAL HIGH (ref 0.0–0.2)

## 2023-06-10 LAB — LACTIC ACID, PLASMA
Lactic Acid, Venous: 0.8 mmol/L (ref 0.5–1.9)
Lactic Acid, Venous: 0.9 mmol/L (ref 0.5–1.9)

## 2023-06-10 LAB — TRIGLYCERIDES: Triglycerides: 197 mg/dL — ABNORMAL HIGH (ref <150)

## 2023-06-10 LAB — PREPARE RBC (CROSSMATCH)

## 2023-06-10 LAB — HEPARIN LEVEL (UNFRACTIONATED)
Heparin Unfractionated: >1.1 [IU]/mL — ABNORMAL HIGH (ref 0.30–0.70)
Heparin Unfractionated: >1.1 [IU]/mL — ABNORMAL HIGH (ref 0.30–0.70)

## 2023-06-10 MED ORDER — ISOSORBIDE MONONITRATE ER 30 MG PO TB24
15.0000 mg | ORAL_TABLET | Freq: Every day | ORAL | Status: DC
  Administered 2023-06-10 – 2023-06-12 (×3): 15 mg via ORAL
  Filled 2023-06-10 (×3): qty 1

## 2023-06-10 MED ORDER — OLANZAPINE 10 MG IM SOLR
2.5000 mg | Freq: Once | INTRAMUSCULAR | Status: DC | PRN
  Filled 2023-06-10: qty 10

## 2023-06-10 MED ORDER — HYDRALAZINE HCL 25 MG PO TABS
12.5000 mg | ORAL_TABLET | Freq: Three times a day (TID) | ORAL | Status: DC
  Administered 2023-06-10 – 2023-06-11 (×3): 12.5 mg via ORAL
  Filled 2023-06-10 (×3): qty 1

## 2023-06-10 MED ORDER — SODIUM CHLORIDE 0.9 % IV SOLN: INTRAVENOUS | Status: AC | PRN

## 2023-06-10 MED ORDER — PRISMASOL BGK 4/2.5 32-4-2.5 MEQ/L EC SOLN: Status: DC

## 2023-06-10 MED ORDER — SODIUM CHLORIDE 0.9 % IV SOLN: 350.0000 [IU]/h | INTRAVENOUS | Status: DC

## 2023-06-10 MED ORDER — SODIUM CHLORIDE 0.9% IV SOLUTION: Freq: Once | INTRAVENOUS | Status: AC

## 2023-06-10 MED ORDER — SODIUM CHLORIDE 0.9% IV SOLUTION: Freq: Once | INTRAVENOUS | Status: DC

## 2023-06-10 MED ORDER — VITAL 1.5 CAL PO LIQD
1000.0000 mL | ORAL | Status: DC
  Administered 2023-06-10 – 2023-06-13 (×3): 1000 mL

## 2023-06-10 MED ORDER — HEPARIN SODIUM (PORCINE) 1000 UNIT/ML DIALYSIS
1000.0000 [IU] | INTRAMUSCULAR | Status: DC | PRN
  Filled 2023-06-10: qty 4

## 2023-06-10 MED ORDER — METHYLPREDNISOLONE SODIUM SUCC 40 MG IJ SOLR
40.0000 mg | Freq: Every day | INTRAMUSCULAR | Status: DC
  Administered 2023-06-10 – 2023-06-13 (×4): 40 mg via INTRAVENOUS
  Filled 2023-06-10 (×4): qty 1

## 2023-06-10 MED ORDER — HEPARIN (PORCINE) 2000 UNITS/L FOR CRRT: INTRAVENOUS_CENTRAL | Status: DC | PRN

## 2023-06-10 MED ORDER — PROSOURCE TF20 ENFIT COMPATIBL EN LIQD
60.0000 mL | Freq: Three times a day (TID) | ENTERAL | Status: DC
  Administered 2023-06-10 – 2023-06-14 (×10): 60 mL
  Filled 2023-06-10 (×10): qty 60

## 2023-06-10 MED ORDER — THIAMINE MONONITRATE 100 MG PO TABS
100.0000 mg | ORAL_TABLET | Freq: Every day | ORAL | Status: DC
  Administered 2023-06-10 – 2023-06-14 (×5): 100 mg
  Filled 2023-06-10 (×5): qty 1

## 2023-06-10 MED ORDER — PRISMASOL BGK 4/2.5 32-4-2.5 MEQ/L REPLACEMENT SOLN: Status: DC

## 2023-06-10 NOTE — Progress Notes (Signed)
 Date and time results received: 06/10/23 0335 (use smartphrase ".now" to insert current time)  Test: Hgb Critical Value: 6.9  Name of Provider Notified: Iran Manna, MD   Orders Received? Or Actions Taken?:   Obtain type and screen and transfuse 1x PRBC

## 2023-06-10 NOTE — Progress Notes (Signed)
 PHARMACY - ANTICOAGULATION CONSULT NOTE  Pharmacy Consult for Eliquis > heparin   Indication: atrial fibrillation  No Known Allergies  Patient Measurements: Height: 6' (182.9 cm) Weight: 79.2 kg (174 lb 9.7 oz) IBW/kg (Calculated) : 77.6    Vital Signs: Temp: 98.6 F (37 C) (02/07 1130) Temp Source: Axillary (02/07 1130) BP: 129/54 (02/07 0900) Pulse Rate: 73 (02/07 1437)  Labs: Recent Labs    06/08/23 0457 06/09/23 1000 06/09/23 1000 06/09/23 2035 06/10/23 0320 06/10/23 0934 06/10/23 1316 06/10/23 1427  HGB  --  7.6*   < >  --  6.9*  --   --  6.5*  HCT  --  24.5*  --   --  21.9*  --   --  19.0*  PLT  --  113*  --   --  105*  --   --   --   APTT  --   --   --  114*  --  110* 41*  --   LABPROT 24.9* 25.6*  --   --   --   --   --   --   INR 2.2* 2.3*  --   --   --   --   --   --   HEPARINUNFRC  --   --   --   --   --  >1.10* >1.10*  --   CREATININE 5.25* 5.74*  --   --  6.22*  --   --   --    < > = values in this interval not displayed.    Estimated Creatinine Clearance: 12.1 mL/min (A) (by C-G formula based on SCr of 6.22 mg/dL (H)).   Medical History: Past Medical History:  Diagnosis Date   AAA (abdominal aortic aneurysm) (HCC)    a. s/p repair 06/2015 with left renal artery bypass at that time, post-op course c/b renal failure requiring dialysis, C dif.   Anemia    Arteriosclerotic cardiovascular disease (ASCVD)    a. AMI in 2000 treated at [facility]. b. cath in 12/2006->  Chronic total obstruction of the RCA;  drug-eluting stent placed in OM1, LVEF abnormal.   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 Pt is on warfarin PTA. Previously Eliquis  copay was not affordable. However, upon copay check this admission, Eliquis  $0/mo. Therefore pharmacy consulted to switch anticoagulation from warfarin to Eliquis .  2/6- noted with increased work of breathing on bipap. Changing apixaban  (last dose 2/5 at 8pm) to IV heparin    -Heparin  level >1.1 as expected -Initial aPTT 114 sec > drawn from same arm heparin  running through.  -repeat  aPTT 41 sec, subtherapeutic  -Hgb 7.6 > 6.2  Goal of Therapy:  Heparin  level 0.3-0.7 units/ml aPTT 66-102 seconds Monitor platelets by anticoagulation protocol: Yes  Plan:  Holding heparin  with anemia / transfusions F/u CBC in AM for restart  Maurilio Fila, PharmD Clinical Pharmacist 06/10/2023  2:38 PM

## 2023-06-10 NOTE — Progress Notes (Addendum)
 Advanced Heart Failure Rounding Note  Cardiologist: Alvan Carrier, MD   Chief Complaint: Acute systolic CHF  Subjective:    More encephalopathic this am. Did not tolerate BiPAP without sedation, now on HHFNC.   Scr continues to rise >> 6.22 this am. BUN 143. Planning to start CRRT.  Seen during CorTrak placement. Groaning, appears uncomfortable.   Objective:   Weight Range: 79.2 kg Body mass index is 23.68 kg/m.   Vital Signs:   Temp:  [97.5 F (36.4 C)-98.3 F (36.8 C)] 97.6 F (36.4 C) (02/07 0646) Pulse Rate:  [72-95] 82 (02/07 0900) Resp:  [15-29] 23 (02/07 0900) BP: (112-156)/(51-115) 129/54 (02/07 0900) SpO2:  [90 %-100 %] 96 % (02/07 0900) FiO2 (%):  [40 %] 40 % (02/07 0800) Weight:  [79.2 kg] 79.2 kg (02/07 0500) Last BM Date : 06/09/23  Weight change: Filed Weights   06/08/23 0520 06/09/23 0500 06/10/23 0500  Weight: 81.3 kg 77.2 kg 79.2 kg    Intake/Output:   Intake/Output Summary (Last 24 hours) at 06/10/2023 0913 Last data filed at 06/10/2023 0900 Gross per 24 hour  Intake 1499.92 ml  Output 2120 ml  Net -620.08 ml      Physical Exam    General:  Acute on chronically ill appearing HEENT: + CorTrak Neck: JVP to ear Cor: Regular rate & rhythm. No rubs, gallops or murmurs. Lungs: Tachypneic, on 30L HHFNC Abdomen: Soft, nondistended.  Extremities: No cyanosis, clubbing, rash, edema   Telemetry   SR 70s  Labs    CBC Recent Labs    06/09/23 1000 06/10/23 0320  WBC 9.9 8.4  NEUTROABS 8.7*  --   HGB 7.6* 6.9*  HCT 24.5* 21.9*  MCV 98.8 100.0  PLT 113* 105*   Basic Metabolic Panel Recent Labs    97/94/74 0457 06/09/23 1000 06/10/23 0320  NA 144 146* 146*  K 4.9 5.7* 4.9  CL 109 108 108  CO2 20* 20* 18*  GLUCOSE 107* 108* 120*  BUN 100* 117* 143*  CREATININE 5.25* 5.74* 6.22*  CALCIUM  10.0 10.1 9.6  PHOS 5.6* 8.4*  --    Liver Function Tests Recent Labs    06/09/23 1000 06/10/23 0320  AST 26 29  ALT 50* 49*   ALKPHOS 51 44  BILITOT 0.6 1.0  PROT 6.4* 5.5*  ALBUMIN  3.3*  3.3* 2.8*   No results for input(s): LIPASE, AMYLASE in the last 72 hours. Cardiac Enzymes No results for input(s): CKTOTAL, CKMB, CKMBINDEX, TROPONINI in the last 72 hours.  BNP: BNP (last 3 results) Recent Labs    06/01/23 2038 06/09/23 1000  BNP 499.0* 1,022.9*    ProBNP (last 3 results) No results for input(s): PROBNP in the last 8760 hours.   D-Dimer No results for input(s): DDIMER in the last 72 hours. Hemoglobin A1C No results for input(s): HGBA1C in the last 72 hours. Fasting Lipid Panel No results for input(s): CHOL, HDL, LDLCALC, TRIG, CHOLHDL, LDLDIRECT in the last 72 hours. Thyroid  Function Tests No results for input(s): TSH, T4TOTAL, T3FREE, THYROIDAB in the last 72 hours.  Invalid input(s): FREET3  Other results:   Imaging    No results found.   Medications:     Scheduled Medications:  sodium chloride    Intravenous Once   arformoterol   15 mcg Nebulization BID   atorvastatin   20 mg Oral Daily   budesonide  (PULMICORT ) nebulizer solution  0.25 mg Nebulization BID   Chlorhexidine  Gluconate Cloth  6 each Topical Daily   docusate  100 mg Oral BID   famotidine   10 mg Oral Daily   feeding supplement  237 mL Oral TID BM   methylPREDNISolone  (SOLU-MEDROL ) injection  40 mg Intravenous Q12H   multivitamin with minerals  1 tablet Oral Daily   polyethylene glycol  17 g Oral Daily   revefenacin   175 mcg Nebulization Daily   sodium bicarbonate   650 mg Oral TID   sterile water  (preservative free)        Infusions:  clevidipine  14 mg/hr (06/10/23 0900)   dexmedetomidine  (PRECEDEX ) IV infusion 0.7 mcg/kg/hr (06/10/23 0900)   furosemide  (LASIX ) 200 mg in dextrose  5 % 100 mL (2 mg/mL) infusion 15 mg/hr (06/10/23 0900)   heparin  950 Units/hr (06/10/23 0900)    PRN Medications: acetaminophen  **OR** acetaminophen , fluticasone , guaiFENesin -codeine ,  hydrALAZINE , ipratropium-albuterol , OLANZapine , mouth rinse, sterile water  (preservative free), traZODone     Patient Profile  73  y/o male w/ prior h/o HFrEF>>HFimpEF, CAD, chronic afib on coumadin , CKD IV, COPD, HTN and PAD.  Admitted w/ acute hypoxic respiratory failure and lactic acidosis, intially felt to be septic shock due to PNA/COPDE. Initially treated w/ IVF resuscitation, abx and steroids. Initially improved but transferred back to ICU w/ recurrent hypoxic respiratory failure/ CHF in setting of worsening renal failure. Echo shows drop in EF 35-40%, RV ok.   Assessment/Plan   1. Acute Hypoxic Respiratory Failure - multifactorial, treating for PNA + COPDE but suspect now predominantly due to CHF - Almost 2L UOP last 24 hrs with lasix  gtt. However, Scr has worsened and BUN rising with worsening respiratory status and encephalopathy. Planning to start CRRT today.  - continue abx, nebs and steroids per CCM  - Did not tolerate BiPAP, now on HHFNC.     2. Acute Systolic Heart Failure - H/o HFrEF>>HFimpEF. EF in 2017 was down to 35-40% but normalized on subsequent echos. EF in 2021 was 50-55%.  - Echo this admit, EF 35-40%, RV ok, IVC dilated assumed RAP ~15, lat/infer wall HK - NYHA Class IIIIb-IV confounded by COPD + PNA. Volume overloaded on exam - Planning for CRRT as above.  - CCM placing central line, follow CVP and CO-OX - GDMT limited by CKD. Currently on cleviprex  infusion, wean off as able. Add hydralazine /imdur  for afterload reduction - etiology for cardiomyopathy uncertain. ICM possibility but not candidate for LHC given renal fx.  - Not a candidate for advanced therapies. Poor long-term candidate for HD. Treating aggressively for now.   3. AKI on CKD IV - Admit SCr 4.4 c/w baseline - Gradual rise in SCr now >6 with BUN 143 - suspect cardiorenal - nephrology following - planning CRRT today as above. Not a good candidate for long-term HD   4. Permanent Atrial  Fibrillation  - rates currently controlled, 70s - on warfarin PTA - covering w/ heparin  gtt while acutely ill   5. Hyperkalemia - Resolved   6. Anemia - Hgb 6.9 - Transfused 1 u RBCs this am    CRITICAL CARE Performed by: COLLETTA SHAVER N   Total critical care time: 20 minutes  Critical care time was exclusive of separately billable procedures and treating other patients.  Critical care was necessary to treat or prevent imminent or life-threatening deterioration.  Critical care was time spent personally by me on the following activities: development of treatment plan with patient and/or surrogate as well as nursing, discussions with consultants, evaluation of patient's response to treatment, examination of patient, obtaining history from patient or surrogate, ordering and performing treatments and interventions,  ordering and review of laboratory studies, ordering and review of radiographic studies, pulse oximetry and re-evaluation of patient's condition.   Length of Stay: 9  Iolanda Folson N, PA-C  06/10/2023, 9:13 AM  Advanced Heart Failure Team Pager [phone] (M-F; 7a - 5p)  Please contact [facility] for night-coverage after hours (5p -7a ) and weekends on [email or web address]

## 2023-06-10 NOTE — Procedures (Addendum)
 Cortrak  Person Inserting Tube:  Rosabel Rollo PARAS, RD Tube Type:  Cortrak - 55 inches Tube Size:  10 Tube Location:  Right nare Secured by: Bridle Technique Used to Measure Tube Placement:  Marking at nare/corner of mouth Cortrak Secured At:  98 cm   Cortrak Tube Team Note:  Consult received to place a Cortrak feeding tube. INR 2.3. Okay to proceed with Cortrak and bridle placement per MD.  Sandrea is required, abdominal x-ray has been ordered by the Cortrak team. Please confirm tube placement before using the Cortrak tube.   If the tube becomes dislodged please keep the tube and contact the Cortrak team at [email or web address] for replacement.  If after hours and replacement cannot be delayed, place a NG tube and confirm placement with an abdominal x-ray.    Mallie Rosabel, MS, RD, LDN Registered Dietitian II Please see [email or web address] for contact information.

## 2023-06-10 NOTE — Progress Notes (Signed)
   06/10/23 0939  Oxygen  Therapy/Pulse Ox  O2 Device (S)  HHFNC  $ High Flow Nasal Cannula  Yes  O2 Therapy Oxygen  humidified  Heater temperature 93.2 F (34 C)  O2 Flow Rate (L/min) 30 L/min  FiO2 (%) 40 %     Pt was placed on HHFNC and is tolerating well at this time. RT will monitor.

## 2023-06-10 NOTE — Progress Notes (Signed)
 NAME:  [name] [name], MRN:  [id number], DOB:  April 23, 1951, LOS: 9 ADMISSION DATE:  06/01/2023, CONSULTATION DATE:  06/02/2023 REFERRING MD:  Dr. Lawence CHIEF COMPLAINT:  Shortness of Breath   History of Present Illness:  [name] [name] is a 73 y.o. male with a PMH significant for HTN, HLD, AAA s/p repair, MI, CAD, Combined systolic/diastolic HF, A-Fib on coumadin , Stroke, PVD, COPD, Tobacco use, CKD stage IV, GERD, who presented to [facility] ER 1/29 with acute onset worsening dyspnea, associated dry cough, and wheezing.  Per chart review, he denied fever, chills, nausea, vomiting. Upon arrival he was in tachycardic, tachypneic, BP 174/64, and placed on Bipap. Labs revealed ABG PH 7.41, HCO3 17.1, Lacitc 4.8->4.6, INR 3.5, RVP negative, BUN 47, creat 4.4, Hgb 9.2. EKG with Afib RVR rate 119. A chest xray was obtained showing mild cardiomegaly, mild edema, and possible atypical pneumonia. Patient was started on Ceftriaxone  and Zithromax , given 125 mg Solu-Medrol  and 2.5 liter IVF bolus. He was planned to be admitted to the step down unit, however, with worsening hypoxia patient was intubated 1/30 and transferred to [facility] ICU for further management.   Pertinent  Medical History  HTN/HLD Afib on Coumadin  MI/CAD COPD Tobacco Use CKD stage IV  Significant Hospital Events: Including procedures, antibiotic start and stop dates in addition to other pertinent events   1/29: Acute respiratory on Bipap, requiring intubation. COPD exacerbation. Suspected PNA. Ceftriaxone /Zithromax /Steroids/Nebs 1/31 Weaned, extubated  2/6 Overnight w/ increase wob placed on bipap and given albumin /lasix . UOP last 24 hours 1.1L.  scr rising. difficulty speaking w/ wob still sig. HF team consulted. LVEF 35-40%. Recommended lasix  gtt. CVL placement for CVP and SCVO2 monitoring. Deemed not a candidate for mechanical support/advanced therapies. Anxiety making NIPPV difficult.  2/7 encephalopathic. Renal fxn worse. Acid base worse.  Hgb down. Got unit of blood. Did not tol NIPPV w/out being sedated. Placed on heated high flow. Decision made to attempt CRRT.   Interim History / Subjective:    Still w/ sig increased WOB. Did not tolerate NIPPV well. Placed on dex.   Objective   Blood pressure (!) 129/54, pulse 82, temperature 97.6 F (36.4 C), temperature source Axillary, resp. rate (!) 23, height 6' (1.829 m), weight 79.2 kg, SpO2 96%.    Vent Mode: BIPAP;PCV FiO2 (%):  [40 %] 40 % Set Rate:  [12 bmp] 12 bmp PEEP:  [5 cmH20] 5 cmH20   Intake/Output Summary (Last 24 hours) at 06/10/2023 0917 Last data filed at 06/10/2023 0900 Gross per 24 hour  Intake 1499.92 ml  Output 2120 ml  Net -620.08 ml   Filed Weights   06/08/23 0520 06/09/23 0500 06/10/23 0500  Weight: 81.3 kg 77.2 kg 79.2 kg   General critically ill appearing 73 year old male. He is laying in bed. Just switched over to heated high flow HENT + JVD MM dry pupils equal bilaterally  Pulm diffuse rales. + abd accessory use. Now on heated high flow currently 30 lpm at 40% Card RRR Abd soft Ext scattered areas of ecchymosis. Warm pulses strong Gu dec'd UOP Neuro sedated on dex. Now weaning for RASS -1. Not following commands. + cough. Moves all ext. Not verbal currently   Resolved Hospital Problem list   Severe sepsis 2/2 PNA   Assessment & Plan:  Acute Hypoxic Respiratory Failure due to acute systolic chf and AECOPD and PNA (NOS) H/o Tobacco Use Negative for Covid, flu, RSV neg. Cultures neg. Extubated 31st; completed abx. suspect heart failure and  volume overload playing major role at this point  Plan Changing to heated high flow in place of bipap w/ delirium I worry about aspiration and he has been intol of bipap prior  Pulse ox  Cont triple therapy nebs Change steroids to 40mg /d Currently on lasix  gtt. Scr rising. Will start CRRT;  but not a good candidate for long term iHD Trending fever and WBC curve s/p completion of abx   AKI on CKD stage  IV Urinary retention  At least some degree cardiorenal  Scr cont to rise w/ lasix  gtt, there was talk about long term home PD per notes. Making minimal urine  Plan Awaiting nephro rounds Start CRRT Stop lasix  once CRRT started Serial chems Renal dose meds Strict I&O  Acute on chronic Combined Systolic/Diastolic HF (echo 08/2019 50-55%, now down 35-30%)  Essential HTN HLD Plan Cleviprex  infusion for SBP goal 120 PRN hydralazine  Will need central access so that we can check CVP and co-ox  Cont tele  Cont statin Needs to get volume off. Lasix  not seeming to work.  will require CRRT  Needs a line w/ cleviprex  infusion   Paroxysmal atrial fibrillation with RVR  Plan IV heparin  instead of DOAC Cont tele Rate control  Anxiety w/ and new onset delirium/ acute metabolic encephalopathy  -mixed picture but suspect uremia also complicating things -got PRN zyprexa  Plan Still has good cough on dex Dex for RASS -1 Supportive care   Fluid and electrolyte and acid base imbalance: hypernatremia, boarderline hyperkalemia, anion gap metabolic acidosis Plan Checking lactic acid and abg  Nephro following Anticipate CRRT  Serial labs  Anemia of Chronic Kidney Disease Hgb drifting down.  Got blood this am Plan Trend cbc OK to cont heparin  for now   Mild thrombocytopenia Plan  Trend cbc  At risk for protein calorie malnutrition  Did have OG output, bile with LIS Patient extubated without any issues Plan Cor trak and start TFs  Best Practice (right click and Reselect all SmartList Selections daily)   Diet/type: NPO; TF later today DVT prophylaxis systemic heparin  Pressure ulcer(s): None GI prophylaxis: H2B Lines: N/A Foley:  Yes, and it is still needed Code Status:  DNR Family update Patient updated at beside 2/6 wife and patient updated at bedside. Both agreed dnr but okay for intubation. Would not want to be on vent/trach or dialysis long term. Consent obtained for  intubation and dialysis catheter placement.  My critical care time is 68 minutes  Maude FORBES Banner ACNP-BC [facility] Pulmonary/Critical Care Pager # [phone] OR # [phone] if no answer

## 2023-06-10 NOTE — Procedures (Signed)
 Arterial Catheter Insertion Procedure Note  PHI AVANS  [id number]  Oct 07, 1950  Date:06/10/23  Time:1:57 PM    Provider Performing: Valinda Novas    Procedure: Insertion of Arterial Line (63379) with US  guidance (23062)   Indication(s) Blood pressure monitoring and/or need for frequent ABGs  Consent Risks of the procedure as well as the alternatives and risks of each were explained to the patient and/or caregiver.  Consent for the procedure was obtained and is signed in the bedside chart  Anesthesia None   Time Out Verified patient identification, verified procedure, site/side was marked, verified correct patient position, special equipment/implants available, medications/allergies/relevant history reviewed, required imaging and test results available.   Sterile Technique Maximal sterile technique including full sterile barrier drape, hand hygiene, sterile gown, sterile gloves, mask, hair covering, sterile ultrasound probe cover (if used).   Procedure Description Area of catheter insertion was cleaned with chlorhexidine  and draped in sterile fashion. With real-time ultrasound guidance an arterial catheter was placed into the right  Axillary  artery.  Appropriate arterial tracings confirmed on monitor.     Complications/Tolerance None; patient tolerated the procedure well.   EBL Minimal   Specimen(s) None

## 2023-06-10 NOTE — Progress Notes (Signed)
 2140: E-link MD notified of pt having increased WOB and anxiety. Unable to obtain ABG d/t pt jerking arm when attempting stick. Order changed to VBG and orders placed for 1x zyprexa  PRN and precedex  gtt.   9656: Pt continuing to have labored breathing and tachypnea. Pt also having anxiety related to Bipap and frequently attempting to remove mask. CCM notified. Orders placed for 1x PRN zyprexa  and precedex  ceiling increased from 0.8 to 1.2.

## 2023-06-10 NOTE — Procedures (Signed)
 Central Venous Catheter Insertion Procedure Note  [name] [name]  [id number]  1950/08/27  Date:06/10/23  Time:1:38 PM   Provider Performing:[name] [name]   Procedure: Insertion of Non-tunneled Central Venous Catheter(36556)with US  guidance (23062)    Indication(s) Hemodialysis  Consent Risks of the procedure as well as the alternatives and risks of each were explained to the patient and/or caregiver.  Consent for the procedure was obtained and is signed in the bedside chart  Anesthesia Topical only with 1% lidocaine    Timeout Verified patient identification, verified procedure, site/side was marked, verified correct patient position, special equipment/implants available, medications/allergies/relevant history reviewed, required imaging and test results available.  Sterile Technique Maximal sterile technique including full sterile barrier drape, hand hygiene, sterile gown, sterile gloves, mask, hair covering, sterile ultrasound probe cover (if used).  Procedure Description Area of catheter insertion was cleaned with chlorhexidine  and draped in sterile fashion.   With real-time ultrasound guidance a HD catheter was placed into the right internal jugular vein.  Nonpulsatile blood flow and easy flushing noted in all ports.  The catheter was sutured in place and sterile dressing applied.  Complications/Tolerance None; patient tolerated the procedure well. Chest X-ray is ordered to verify placement for internal jugular or subclavian cannulation.  Chest x-ray is not ordered for femoral cannulation.  EBL Minimal  Specimen(s) None

## 2023-06-10 NOTE — Progress Notes (Signed)
 S:Had anxiety overnight in the ICU with labored breathing and tachypnea while on BiPAP. He was given zyprexa  and precedex  to help calm him. He was also transfused 1u ON for Hgb 6.9. This morning he awakes to my voice but has BiPAP on and is not speaking with me. O:BP (!) 129/59   Pulse 78   Temp 97.6 F (36.4 C) (Axillary)   Resp (!) 23   Ht 6' (1.829 m)   Wt 79.2 kg   SpO2 96%   BMI 23.68 kg/m   Intake/Output Summary (Last 24 hours) at 06/10/2023 0734 Last data filed at 06/10/2023 9357 Gross per 24 hour  Intake 1014.48 ml  Output 1920 ml  Net -905.52 ml   Intake/Output: I/O last 3 completed shifts: In: 1289.1 [P.O.:120; I.V.:847.6; IV Piggyback:321.4] Out: 3070 [Urine:3070]  Intake/Output this shift:  No intake/output data recorded. Weight change: 2.043 kg Gen: Lying in bed, no acute distress CVS: Regular rate and rhythm no murmurs rubs or gallops appreciated Resp: Diminished breath sounds bilaterally, BiPAP in place Abd: Soft, nondistended, mildly tender throughout Ext: Bilateral upper extremities edematous   Recent Labs  Lab 06/03/23 1040 06/03/23 1641 06/03/23 1834 06/04/23 0337 06/05/23 0920 06/06/23 0918 06/07/23 0509 06/08/23 0457 06/09/23 1000 06/10/23 0320  NA 142  --    < > 137 142 146* 143 144 146* 146*  K 4.7  --    < > 4.7 4.5 4.8 5.2* 4.9 5.7* 4.9  CL 105  --    < > 103 106 112* 110 109 108 108  CO2 20*  --    < > 17* 20* 18* 18* 20* 20* 18*  GLUCOSE 95  --    < > 122* 160* 147* 115* 107* 108* 120*  BUN 72*  --    < > 83* 96* 102* 105* 100* 117* 143*  CREATININE 5.28*  --    < > 5.43* 5.65* 5.59* 5.63* 5.25* 5.74* 6.22*  ALBUMIN   --   --   --   --   --   --   --  2.9* 3.3*  3.3* 2.8*  CALCIUM  10.0  --    < > 9.6 9.8 10.2 10.0 10.0 10.1 9.6  PHOS 7.0* 6.1*  --  5.2*  --   --   --  5.6* 8.4*  --   AST  --   --   --   --   --   --   --   --  26 29  ALT  --   --   --   --   --   --   --   --  50* 49*   < > = values in this interval not displayed.    Liver Function Tests: Recent Labs  Lab 06/08/23 0457 06/09/23 1000 06/10/23 0320  AST  --  26 29  ALT  --  50* 49*  ALKPHOS  --  51 44  BILITOT  --  0.6 1.0  PROT  --  6.4* 5.5*  ALBUMIN  2.9* 3.3*  3.3* 2.8*   No results for input(s): LIPASE, AMYLASE in the last 168 hours. No results for input(s): AMMONIA in the last 168 hours. CBC: Recent Labs  Lab 06/05/23 0920 06/06/23 0918 06/07/23 0509 06/09/23 1000 06/10/23 0320  WBC 10.8* 11.3* 10.2 9.9 8.4  NEUTROABS  --   --   --  8.7*  --   HGB 8.9* 8.5* 8.0* 7.6* 6.9*  HCT 27.3* 26.5* 25.1* 24.5* 21.9*  MCV 93.5 95.7 95.4 98.8 100.0  PLT 151 140* 134* 113* 105*   Cardiac Enzymes: No results for input(s): CKTOTAL, CKMB, CKMBINDEX, TROPONINI in the last 168 hours. CBG: Recent Labs  Lab 06/09/23 1110 06/09/23 1611 06/09/23 1954 06/09/23 2355 06/10/23 0306  GLUCAP 107* 119* 116* 121* 128*    Iron  Studies: No results for input(s): IRON , TIBC, TRANSFERRIN, FERRITIN in the last 72 hours. Studies/Results: DG CHEST PORT 1 VIEW Result Date: 06/09/2023 CLINICAL DATA:  10026 Shortness of breath 10026 EXAM: PORTABLE CHEST 1 VIEW COMPARISON:  CXR 06/08/23 FINDINGS: Unchanged small left pleural effusion and improved right-sided pleural effusion. No pneumothorax. Cardiomegaly. Prominent bilateral interstitial opacities could represent pulmonary venous congestion or mild pulmonary edema. No focal airspace opacity. No radiographically apparent displaced rib fractures. Visualized upper abdomen unremarkable. IMPRESSION: Cardiomegaly with improved right-sided pleural effusion and unchanged left-sided pleural effusion no focal airspace opacity Electronically Signed   By: Lyndall Gore M.D.   On: 06/09/2023 08:33   DG CHEST PORT 1 VIEW Result Date: 06/08/2023 CLINICAL DATA:  Shortness of breath EXAM: PORTABLE CHEST 1 VIEW COMPARISON:  06/03/2023 FINDINGS: Previously seen endotracheal tube and nasogastric tube no longer  present. Chronic cardiomegaly. Coronary artery stents. Aortic atherosclerotic calcification. Pulmonary venous hypertension without frank edema. Small bilateral pleural effusions. Findings consistent with low-grade congestive heart failure. IMPRESSION: Low-grade congestive heart failure. Electronically Signed   By: Oneil Officer M.D.   On: 06/08/2023 16:15   ECHOCARDIOGRAM COMPLETE Result Date: 06/08/2023    ECHOCARDIOGRAM REPORT   Patient Name:   [name] [name] Date of Exam: 06/08/2023 Medical Rec #:  [id number]       Height:       72.0 in Accession #:    [id number]      Weight:       179.2 lb Date of Birth:  1950/09/25       BSA:          2.033 m Patient Age:    72 years        BP:           180/71 mmHg Patient Gender: M               HR:           87 bpm. Exam Location:  Inpatient Procedure: 2D Echo, Cardiac Doppler and Color Doppler  History:        Patient has prior history of Echocardiogram examinations, most                 recent 08/19/2017. CHF, CAD, COPD, Arrythmias:Atrial                 Fibrillation, Signs/Symptoms:Shortness of Breath; Risk                 Factors:Hypertension. ESRD.  Sonographer:    Lanell Maduro Referring Phys: [id number] KUBER GHIMIRE IMPRESSIONS  1. HYpokinesis worse in the lateral and inferior walls . Left ventricular ejection fraction, by estimation, is 35 to 40%. The left ventricle has moderately decreased function. The left ventricle demonstrates global hypokinesis. There is mild left ventricular hypertrophy. Left ventricular diastolic parameters are indeterminate.  2. Right ventricular systolic function is normal. The right ventricular size is normal.  3. Left atrial size was severely dilated.  4. Right atrial size was severely dilated.  5. Mild to moderate mitral valve regurgitation.  6. Tricuspid valve regurgitation is mild to moderate.  7. The aortic valve is tricuspid. Aortic valve regurgitation is not  visualized. Aortic valve sclerosis/calcification is present, without any  evidence of aortic stenosis.  8. The inferior vena cava is dilated in size with <50% respiratory variability, suggesting right atrial pressure of 15 mmHg. Comparison(s): The left ventricular function is worsened. FINDINGS  Left Ventricle: HYpokinesis worse in the lateral and inferior walls. Left ventricular ejection fraction, by estimation, is 35 to 40%. The left ventricle has moderately decreased function. The left ventricle demonstrates global hypokinesis. The left ventricular internal cavity size was normal in size. There is mild left ventricular hypertrophy. Left ventricular diastolic parameters are indeterminate. Right Ventricle: The right ventricular size is normal. Right vetricular wall thickness was not assessed. Right ventricular systolic function is normal. Left Atrium: Left atrial size was severely dilated. Right Atrium: Right atrial size was severely dilated. Pericardium: Trivial pericardial effusion is present. Mitral Valve: Mild mitral annular calcification. Mild to moderate mitral valve regurgitation. Tricuspid Valve: The tricuspid valve is normal in structure. Tricuspid valve regurgitation is mild to moderate. Aortic Valve: The aortic valve is tricuspid. Aortic valve regurgitation is not visualized. Aortic valve sclerosis/calcification is present, without any evidence of aortic stenosis. Pulmonic Valve: The pulmonic valve was grossly normal. Pulmonic valve regurgitation is trivial. Aorta: The aortic root and ascending aorta are structurally normal, with no evidence of dilitation. Venous: The inferior vena cava is dilated in size with less than 50% respiratory variability, suggesting right atrial pressure of 15 mmHg. IAS/Shunts: No atrial level shunt detected by color flow Doppler.  LEFT VENTRICLE PLAX 2D LVIDd:         5.30 cm      Diastology LVIDs:         4.30 cm      LV e' medial:    5.11 cm/s LV PW:         1.30 cm      LV E/e' medial:  33.9 LV IVS:        0.90 cm      LV e' lateral:   8.38 cm/s  LVOT diam:     2.30 cm      LV E/e' lateral: 20.6 LV SV:         66 LV SV Index:   33 LVOT Area:     4.15 cm  LV Volumes (MOD) LV vol d, MOD A2C: 117.0 ml LV vol d, MOD A4C: 75.0 ml LV vol s, MOD A2C: 59.6 ml LV vol s, MOD A4C: 49.2 ml LV SV MOD A2C:     57.4 ml LV SV MOD A4C:     75.0 ml LV SV MOD BP:      38.4 ml RIGHT VENTRICLE             IVC RV Basal diam:  2.80 cm     IVC diam: 2.90 cm RV S prime:     10.30 cm/s TAPSE (M-mode): 1.7 cm LEFT ATRIUM              Index        RIGHT ATRIUM           Index LA diam:        5.90 cm  2.90 cm/m   RA Area:     32.80 cm LA Vol (A2C):   151.0 ml 74.26 ml/m  RA Volume:   118.00 ml 58.03 ml/m LA Vol (A4C):   101.0 ml 49.67 ml/m LA Biplane Vol: 124.0 ml 60.98 ml/m  AORTIC VALVE LVOT Vmax:   95.25 cm/s LVOT Vmean:  60.350 cm/s  LVOT VTI:    0.160 m  AORTA Ao Root diam: 3.30 cm Ao Asc diam:  3.70 cm MITRAL VALVE                TRICUSPID VALVE MV Area (PHT): 3.67 cm     TR Peak grad:   30.0 mmHg MR Peak grad: 121.9 mmHg    TR Vmax:        274.00 cm/s MR Mean grad: 82.0 mmHg MR Vmax:      552.00 cm/s   SHUNTS MR Vmean:     433.0 cm/s    Systemic VTI:  0.16 m MV E velocity: 173.00 cm/s  Systemic Diam: 2.30 cm Vina Gull MD Electronically signed by Vina Gull MD Signature Date/Time: 06/08/2023/3:39:02 PM    Final     sodium chloride    Intravenous Once   arformoterol   15 mcg Nebulization BID   atorvastatin   20 mg Oral Daily   budesonide  (PULMICORT ) nebulizer solution  0.25 mg Nebulization BID   Chlorhexidine  Gluconate Cloth  6 each Topical Daily   docusate  100 mg Oral BID   famotidine   10 mg Oral Daily   feeding supplement  237 mL Oral TID BM   methylPREDNISolone  (SOLU-MEDROL ) injection  40 mg Intravenous Q12H   multivitamin with minerals  1 tablet Oral Daily   polyethylene glycol  17 g Oral Daily   revefenacin   175 mcg Nebulization Daily   sodium bicarbonate   650 mg Oral TID   sterile water  (preservative free)        BMET    Component Value Date/Time   NA  146 (H) 06/10/2023 0320   NA 143 04/05/2023 0804   K 4.9 06/10/2023 0320   CL 108 06/10/2023 0320   CO2 18 (L) 06/10/2023 0320   GLUCOSE 120 (H) 06/10/2023 0320   BUN 143 (H) 06/10/2023 0320   BUN 42 (H) 04/05/2023 0804   CREATININE 6.22 (H) 06/10/2023 0320   CREATININE 2.99 (H) 01/22/2020 1501   CALCIUM  9.6 06/10/2023 0320   GFRNONAA 9 (L) 06/10/2023 0320   GFRNONAA 53 (L) 05/14/2013 0905   GFRAA 24 (L) 10/16/2019 1634   GFRAA 62 05/14/2013 0905   CBC    Component Value Date/Time   WBC 8.4 06/10/2023 0320   RBC 2.19 (L) 06/10/2023 0320   HGB 6.9 (LL) 06/10/2023 0320   HGB 9.1 (L) 04/05/2023 0804   HCT 21.9 (L) 06/10/2023 0320   HCT 28.7 (L) 04/05/2023 0804   PLT 105 (L) 06/10/2023 0320   PLT 144 (L) 04/05/2023 0804   MCV 100.0 06/10/2023 0320   MCV 94 04/05/2023 0804   MCH 31.5 06/10/2023 0320   MCHC 31.5 06/10/2023 0320   RDW 15.6 (H) 06/10/2023 0320   RDW 14.0 04/05/2023 0804   LYMPHSABS 0.2 (L) 06/09/2023 1000   LYMPHSABS 1.2 04/05/2023 0804   MONOABS 0.7 06/09/2023 1000   EOSABS 0.0 06/09/2023 1000   EOSABS 0.1 04/05/2023 0804   BASOSABS 0.0 06/09/2023 1000   BASOSABS 0.0 04/05/2023 0804     Assessment/Plan:  1. Acute kidney Injury on chronic kidney disease stage IV: Nonoliguric (1.9L output yesterday) and appears to be hemodynamically mediated in the setting of COPD exacerbation with increased insensible losses. FENa <1% further suggesting pre-renal etiology, now with element of fluid overload. Continued need for steroids also contributing to persistent renal dysfunction and expect once completed course will have some improvement in renal function. CXR yesterday with pulmonary congestion. He has continued to receive to diuretics  in the ICU with unfortunate worsening of renal function this AM from 5.74>6.22. He will need CRRT initiation today given ICU status and soft BP. Patient remains not ready for discharge from renal standpoint. 2.  Hypernatremia: Secondary to  dehydration from increased insensible losses, improved with D5 water  prior. Now slowly trending back up with worsening respiratory function to 146 this AM. CRRT can help correct. 3.  Hyperkalemia: Stable and resolved this AM at 4.9. 4.  Acute exacerbation of COPD: With acute hypoxic respiratory failure.  Ongoing treatment with bronchodilators and corticosteroids as well as empiric antimicrobial coverage for CAP. Now in ICU on BiPAP due to worsening status. 5.  Anemia: Likely secondary to chronic kidney disease. IDA appreciated on prior labs. He is s/p 3 doses of venofer . Plan for total 4 doses. Acutely dropped yesterday to 6.9 and is now s/p 1u pRBCs.  Stuart Redo, MD Family Medicine Resident, PGY-2 7:34 AM 06/10/2023  [employer] [phone]

## 2023-06-10 NOTE — Progress Notes (Signed)
 OT Cancellation Note  Patient Details Name: [name] [name] MRN: [id number] DOB: [birth year]   Cancelled Treatment:    Reason Eval/Treat Not Completed: Medical issues which prohibited therapy (Per RN, pt with increased respiratory issues this morning, on BiPAP, and not appropriate for OT treat at this time. OT will reattempt to see pt at a later time as appropriate/available.)  Margarie Rockey HERO., OTR/L, MA Acute Rehab [phone]   Margarie FORBES Horns 06/10/2023, 11:15 AM

## 2023-06-10 NOTE — Progress Notes (Signed)
 Nutrition Follow-up  DOCUMENTATION CODES:   Non-severe (moderate) malnutrition in context of chronic illness  INTERVENTION:   Tube Feeding via Cortrak:  Vital 1.5 at 55 ml/hr Begin TF at 20 ml/hr, titrate by 10 mL q 8 hours until goal rate of 55 ml/hr Pro-Source TF20 mL TID TF at goal provides 2200 kcals, 149 g of protein and 1003 mL of free water   Trend sodium post TF and CRRT initiation, sodium currently mildly elevated. May need to consider free water  flushes if sodium continues to trend up  Continue Standard MVI with Minerals for now, may need to transition to Renal MVI.  Add Thiamine  100 mg daily x 7 days   NUTRITION DIAGNOSIS:   Moderate Malnutrition related to chronic illness (COPD) as evidenced by moderate fat depletion, severe muscle depletion, moderate muscle depletion.  Being addressed via initiation of TF  GOAL:   Patient will meet greater than or equal to 90% of their needs  Progressing  MONITOR:   Diet advancement, Skin, I & O's, Weight trends, TF tolerance, Labs  REASON FOR ASSESSMENT:   Consult Enteral/tube feeding initiation and management, Wound healing  ASSESSMENT:   Pt with PMH of HTN, HLD, AAA s/p repair, MI, CAD, CHF, Afib on coumadin , stroke, PVD, COPD, tobacco abuse, CKD stage IV, GERD admitted with acute respiratory failure requiring intubation due to COPD exacerbation and suspected PNA.  1/29 Admitted, BiPap 1/30 Intubated, TF ordered 1/31 Extubated, TF never started 2/06 Advanced HF consulted, Transferred to ICU with resp decline, Bipap 2/07 HHFNC, Cortrak placed-tip post-pyloric  Pt currently on HFNC, Cortrak placed today, tip post-pyloric. Plan to start CRRT today  BUN 146, Creatinine 6.22. Noted BUN trending up, Creatinine rising. Pt on lasix  gtt, NPO  UOP 1.92 L in 24 hours, 200 mL thus far today  Last 8 recorded meals (from 2/01-2/04): 50-100% of meals on Regular diet  Sodium Trend: 142->146->143->144->146->146 Potassium  trend: 4.5->4.8->5.2->4.9->5.7->4.9  Labs: corrected calcium  10.6 (H)-albumin  2.8, CBGs 107-128 acceptable Meds: sodium bicarb, MVI with Minerals, solumedrol   Diet Order:   Diet Order             Diet NPO time specified  Diet effective now                   EDUCATION NEEDS:   Not appropriate for education at this time  Skin:  Skin Assessment: Skin Integrity Issues: Skin Integrity Issues:: DTI DTI: sacrum (image in chart)  Last BM:  2/06  Height:   Ht Readings from Last 1 Encounters:  06/01/23 6' (1.829 m)    Weight:   Wt Readings from Last 1 Encounters:  06/10/23 79.2 kg    BMI:  Body mass index is 23.68 kg/m.  Estimated Nutritional Needs:   Kcal:  2200-2400 kcals  Protein:  135-165 g  Fluid:  1.8 L   Betsey Finger MS, RDN, LDN, CNSC Registered Dietitian 3 Clinical Nutrition RD Inpatient Contact Info in Amion

## 2023-06-10 NOTE — Progress Notes (Signed)
 RT x 2 attempted placing arterial line with doppler. Attempts were unsuccessful. CCM MD made aware.

## 2023-06-10 NOTE — Plan of Care (Signed)
  Problem: Fluid Volume: Goal: Hemodynamic stability will improve Outcome: Progressing   Problem: Clinical Measurements: Goal: Cardiovascular complication will be avoided Outcome: Progressing   Problem: Pain Managment: Goal: General experience of comfort will improve and/or be controlled Outcome: Progressing   Problem: Safety: Goal: Ability to remain free from injury will improve Outcome: Progressing   Problem: Skin Integrity: Goal: Risk for impaired skin integrity will decrease Outcome: Progressing   Problem: Clinical Measurements: Goal: Diagnostic test results will improve Outcome: Not Progressing   Problem: Respiratory: Goal: Ability to maintain adequate ventilation will improve Outcome: Not Progressing   Problem: Clinical Measurements: Goal: Respiratory complications will improve Outcome: Not Progressing   Problem: Nutrition: Goal: Adequate nutrition will be maintained Outcome: Not Progressing   Problem: Coping: Goal: Level of anxiety will decrease Outcome: Not Progressing

## 2023-06-10 NOTE — Progress Notes (Deleted)
 PHARMACY - ANTICOAGULATION  Pharmacy Consult for  heparin   Indication: atrial fibrillation Brief A/P: aPTT supratherapeutic Decrease Heparin  rate  No Known Allergies  Patient Measurements: Height: 6' (182.9 cm) Weight: 77.2 kg (170 lb 1.6 oz) IBW/kg (Calculated) : 77.6    Vital Signs: Temp: 98.3 F (36.8 C) (02/06 2333) Temp Source: Axillary (02/06 2333) BP: 154/68 (02/07 0000) Pulse Rate: 83 (02/07 0005)  Labs: Recent Labs    06/07/23 0509 06/08/23 0457 06/09/23 1000 06/09/23 2035  HGB 8.0*  --  7.6*  --   HCT 25.1*  --  24.5*  --   PLT 134*  --  113*  --   APTT  --   --   --  114*  LABPROT 22.3* 24.9* 25.6*  --   INR 1.9* 2.2* 2.3*  --   CREATININE 5.63* 5.25* 5.74*  --     Estimated Creatinine Clearance: 13.1 mL/min (A) (by C-G formula based on SCr of 5.74 mg/dL (H)).  Assessment: 73 y.o. male with h/o Afib, Eliquis  on hold, for heparin   Goal of Therapy:  aPTT 66-102 sec Heparin  level 0.3-0.7 units/mL Monitor platelets by anticoagulation protocol: Yes   Plan:  Decrease Heparin  900 units/hr Check aPTT in 8 hours  Cathlyn Arrant, PharmD, BCPS

## 2023-06-10 NOTE — Progress Notes (Signed)
 PHARMACY - ANTICOAGULATION CONSULT NOTE  Pharmacy Consult for Eliquis > heparin   Indication: atrial fibrillation  No Known Allergies  Patient Measurements: Height: 6' (182.9 cm) Weight: 77.2 kg (170 lb 1.6 oz) IBW/kg (Calculated) : 77.6    Vital Signs: Temp: 98.3 F (36.8 C) (02/06 2333) Temp Source: Axillary (02/06 2333) BP: 154/68 (02/07 0000) Pulse Rate: 83 (02/07 0005)  Labs: Recent Labs    06/07/23 0509 06/08/23 0457 06/09/23 1000 06/09/23 2035  HGB 8.0*  --  7.6*  --   HCT 25.1*  --  24.5*  --   PLT 134*  --  113*  --   APTT  --   --   --  114*  LABPROT 22.3* 24.9* 25.6*  --   INR 1.9* 2.2* 2.3*  --   CREATININE 5.63* 5.25* 5.74*  --     Estimated Creatinine Clearance: 13.1 mL/min (A) (by C-G formula based on SCr of 5.74 mg/dL (H)).   Medical History: Past Medical History:  Diagnosis Date   AAA (abdominal aortic aneurysm) (HCC)    a. s/p repair 06/2015 with left renal artery bypass at that time, post-op course c/b renal failure requiring dialysis, C dif.   Anemia    Arteriosclerotic cardiovascular disease (ASCVD)    a. AMI in 2000 treated at [facility]. b. cath in 12/2006->  Chronic total obstruction of the RCA;  drug-eluting stent placed in OM1, LVEF abnormal.   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 Pt is on warfarin PTA. Previously Eliquis  copay was not affordable. However, upon copay check this admission, Eliquis  $0/mo. Therefore pharmacy consulted to switch anticoagulation from warfarin to Eliquis .  2/6- noted with increased work of breathing on bipap. Changing apixaban  (last dose 2/5 at 8pm) to IV heparin   -hg= 8 (stable), plt= 134  2/7 AM update:  aPTT supra-therapeutic   Goal of Therapy:  Heparin  level 0.3-0.7 units/mL aPTT 66-102 secs Monitor platelets by anticoagulation protocol: Yes   Plan:  -Dec heparin  to 950 units/hr -8 hour aPTT/heparin  level  Lynwood Mckusick, PharmD, BCPS Clinical Pharmacist Phone: [phone]

## 2023-06-10 NOTE — Progress Notes (Signed)
 PT Cancellation Note  Patient Details Name: [name] [name] MRN: [id number] DOB: 03/07/1951   Cancelled Treatment:    Reason Eval/Treat Not Completed: Medical issues which prohibited therapy (Pt declining on BIPAP this morning, uncomfortable and on HHFNC this afternoon with minimal improvement in cognition. Nursing asked to hold for today. Will follow up as able and appropriate.)  Dorothyann Maier, DPT, CLT  Acute Rehabilitation Services Office: [phone] (Secure chat preferred)   Dorothyann VEAR Maier 06/10/2023, 4:21 PM

## 2023-06-11 ENCOUNTER — Inpatient Hospital Stay (HOSPITAL_COMMUNITY): Payer: 59

## 2023-06-11 DIAGNOSIS — I5023 Acute on chronic systolic (congestive) heart failure: Secondary | ICD-10-CM

## 2023-06-11 DIAGNOSIS — J9601 Acute respiratory failure with hypoxia: Secondary | ICD-10-CM | POA: Diagnosis not present

## 2023-06-11 DIAGNOSIS — N179 Acute kidney failure, unspecified: Secondary | ICD-10-CM

## 2023-06-11 DIAGNOSIS — G9341 Metabolic encephalopathy: Secondary | ICD-10-CM

## 2023-06-11 DIAGNOSIS — J441 Chronic obstructive pulmonary disease with (acute) exacerbation: Secondary | ICD-10-CM | POA: Diagnosis not present

## 2023-06-11 LAB — RENAL FUNCTION PANEL
Albumin: 2.9 g/dL — ABNORMAL LOW (ref 3.5–5.0)
Albumin: 2.8 g/dL — ABNORMAL LOW (ref 3.5–5.0)
Anion gap: 17 — ABNORMAL HIGH (ref 5–15)
Anion gap: 11 (ref 5–15)
BUN: 84 mg/dL — ABNORMAL HIGH (ref 8–23)
BUN: 55 mg/dL — ABNORMAL HIGH (ref 8–23)
CO2: 22 mmol/L (ref 22–32)
CO2: 23 mmol/L (ref 22–32)
Calcium: 9.1 mg/dL (ref 8.9–10.3)
Calcium: 8.8 mg/dL — ABNORMAL LOW (ref 8.9–10.3)
Chloride: 102 mmol/L (ref 98–111)
Chloride: 103 mmol/L (ref 98–111)
Creatinine, Ser: 3.35 mg/dL — ABNORMAL HIGH (ref 0.61–1.24)
Creatinine, Ser: 2.19 mg/dL — ABNORMAL HIGH (ref 0.61–1.24)
GFR, Estimated: 19 mL/min — ABNORMAL LOW (ref >60)
GFR, Estimated: 32 mL/min — ABNORMAL LOW (ref >60)
Glucose, Bld: 130 mg/dL — ABNORMAL HIGH (ref 70–99)
Glucose, Bld: 120 mg/dL — ABNORMAL HIGH (ref 70–99)
Phosphorus: 4.7 mg/dL — ABNORMAL HIGH (ref 2.5–4.6)
Phosphorus: 3.4 mg/dL (ref 2.5–4.6)
Potassium: 4.3 mmol/L (ref 3.5–5.1)
Potassium: 5 mmol/L (ref 3.5–5.1)
Sodium: 141 mmol/L (ref 135–145)
Sodium: 137 mmol/L (ref 135–145)

## 2023-06-11 LAB — BPAM RBC
Blood Product Expiration Date: 202502172359
Blood Product Expiration Date: 202502172359
ISSUE DATE / TIME: 202502070622
ISSUE DATE / TIME: 202502071558
Unit Type and Rh: 1700
Unit Type and Rh: 1700

## 2023-06-11 LAB — COOXEMETRY PANEL
Carboxyhemoglobin: 1.9 % — ABNORMAL HIGH (ref 0.5–1.5)
Methemoglobin: <0.7 % (ref 0.0–1.5)
O2 Saturation: 83 %
Total hemoglobin: 8.8 g/dL — ABNORMAL LOW (ref 12.0–16.0)

## 2023-06-11 LAB — TYPE AND SCREEN
ABO/RH(D): B NEG
Antibody Screen: NEGATIVE
Unit division: 0
Unit division: 0

## 2023-06-11 LAB — GLUCOSE, CAPILLARY
Glucose-Capillary: 120 mg/dL — ABNORMAL HIGH (ref 70–99)
Glucose-Capillary: 119 mg/dL — ABNORMAL HIGH (ref 70–99)
Glucose-Capillary: 128 mg/dL — ABNORMAL HIGH (ref 70–99)
Glucose-Capillary: 127 mg/dL — ABNORMAL HIGH (ref 70–99)
Glucose-Capillary: 109 mg/dL — ABNORMAL HIGH (ref 70–99)
Glucose-Capillary: 107 mg/dL — ABNORMAL HIGH (ref 70–99)
Glucose-Capillary: 94 mg/dL (ref 70–99)

## 2023-06-11 LAB — CBC
HCT: 26.4 % — ABNORMAL LOW (ref 39.0–52.0)
Hemoglobin: 8.9 g/dL — ABNORMAL LOW (ref 13.0–17.0)
MCH: 31.6 pg (ref 26.0–34.0)
MCHC: 33.7 g/dL (ref 30.0–36.0)
MCV: 93.6 fL (ref 80.0–100.0)
Platelets: 103 10*3/uL — ABNORMAL LOW (ref 150–400)
RBC: 2.82 MIL/uL — ABNORMAL LOW (ref 4.22–5.81)
RDW: 16.1 % — ABNORMAL HIGH (ref 11.5–15.5)
WBC: 11.7 10*3/uL — ABNORMAL HIGH (ref 4.0–10.5)
nRBC: 0.6 % — ABNORMAL HIGH (ref 0.0–0.2)

## 2023-06-11 LAB — APTT
aPTT: 35 s (ref 24–36)
aPTT: 68 s — ABNORMAL HIGH (ref 24–36)
aPTT: 73 s — ABNORMAL HIGH (ref 24–36)

## 2023-06-11 LAB — HEPARIN LEVEL (UNFRACTIONATED): Heparin Unfractionated: >1.1 [IU]/mL — ABNORMAL HIGH (ref 0.30–0.70)

## 2023-06-11 LAB — MAGNESIUM: Magnesium: 2.9 mg/dL — ABNORMAL HIGH (ref 1.7–2.4)

## 2023-06-11 MED ORDER — HEPARIN (PORCINE) 25000 UT/250ML-% IV SOLN
950.0000 [IU]/h | INTRAVENOUS | Status: DC
Start: 2023-06-11 — End: 2023-06-14
  Administered 2023-06-11 – 2023-06-13 (×3): 950 [IU]/h via INTRAVENOUS
  Filled 2023-06-11 (×3): qty 250

## 2023-06-11 MED ORDER — ATORVASTATIN CALCIUM 10 MG PO TABS
20.0000 mg | ORAL_TABLET | Freq: Every day | ORAL | Status: DC
  Administered 2023-06-11 – 2023-06-14 (×4): 20 mg
  Filled 2023-06-11 (×4): qty 2

## 2023-06-11 MED ORDER — DEXMEDETOMIDINE HCL IN NACL 400 MCG/100ML IV SOLN
INTRAVENOUS | Status: AC
  Administered 2023-06-11: 0.4 ug/kg/h via INTRAVENOUS
  Filled 2023-06-11: qty 100

## 2023-06-11 MED ORDER — DEXMEDETOMIDINE HCL IN NACL 400 MCG/100ML IV SOLN
0.0000 ug/kg/h | INTRAVENOUS | Status: DC
  Administered 2023-06-11 – 2023-06-12 (×2): 0.3 ug/kg/h via INTRAVENOUS
  Administered 2023-06-13: 0.4 ug/kg/h via INTRAVENOUS
  Administered 2023-06-13: 0.3 ug/kg/h via INTRAVENOUS
  Administered 2023-06-14: 0.7 ug/kg/h via INTRAVENOUS
  Administered 2023-06-14 – 2023-06-15 (×2): 1 ug/kg/h via INTRAVENOUS
  Administered 2023-06-15 (×2): 1.2 ug/kg/h via INTRAVENOUS
  Administered 2023-06-15: 1.1 ug/kg/h via INTRAVENOUS
  Filled 2023-06-11 (×6): qty 100
  Filled 2023-06-11 (×2): qty 200

## 2023-06-11 MED ORDER — DOCUSATE SODIUM 50 MG/5ML PO LIQD: 100.0000 mg | Freq: Two times a day (BID) | ORAL | Status: DC

## 2023-06-11 MED ORDER — CARVEDILOL 12.5 MG PO TABS
12.5000 mg | ORAL_TABLET | Freq: Two times a day (BID) | ORAL | Status: DC
  Administered 2023-06-11: 12.5 mg
  Administered 2023-06-12: 6.25 mg
  Filled 2023-06-11: qty 1

## 2023-06-11 MED ORDER — ACETAMINOPHEN 650 MG RE SUPP: 650.0000 mg | Freq: Four times a day (QID) | RECTAL | Status: DC | PRN

## 2023-06-11 MED ORDER — IPRATROPIUM-ALBUTEROL 0.5-2.5 (3) MG/3ML IN SOLN
3.0000 mL | RESPIRATORY_TRACT | Status: DC | PRN
  Administered 2023-06-11: 3 mL via RESPIRATORY_TRACT
  Filled 2023-06-11: qty 3

## 2023-06-11 MED ORDER — HALOPERIDOL LACTATE 5 MG/ML IJ SOLN
1.0000 mg | Freq: Four times a day (QID) | INTRAMUSCULAR | Status: DC | PRN
  Filled 2023-06-11: qty 1

## 2023-06-11 MED ORDER — AMLODIPINE BESYLATE 10 MG PO TABS
10.0000 mg | ORAL_TABLET | Freq: Every day | ORAL | Status: DC
  Administered 2023-06-11 – 2023-06-12 (×2): 10 mg
  Filled 2023-06-11 (×3): qty 1

## 2023-06-11 MED ORDER — HYDRALAZINE HCL 50 MG PO TABS
50.0000 mg | ORAL_TABLET | Freq: Three times a day (TID) | ORAL | Status: DC
  Administered 2023-06-11 – 2023-06-14 (×5): 50 mg
  Filled 2023-06-11 (×6): qty 1

## 2023-06-11 MED ORDER — POLYETHYLENE GLYCOL 3350 17 G PO PACK: 17.0000 g | PACK | Freq: Every day | ORAL | Status: DC

## 2023-06-11 MED ORDER — CLEVIDIPINE BUTYRATE 0.5 MG/ML IV EMUL
0.0000 mg/h | INTRAVENOUS | Status: DC
  Administered 2023-06-11 (×2): 20 mg/h via INTRAVENOUS
  Administered 2023-06-11: 5 mg/h via INTRAVENOUS
  Administered 2023-06-11: 18 mg/h via INTRAVENOUS
  Filled 2023-06-11 (×3): qty 100

## 2023-06-11 MED ORDER — ACETAMINOPHEN 325 MG PO TABS: 650.0000 mg | ORAL_TABLET | Freq: Four times a day (QID) | ORAL | Status: DC | PRN

## 2023-06-11 MED ORDER — ADULT MULTIVITAMIN W/MINERALS CH
1.0000 | ORAL_TABLET | Freq: Every day | ORAL | Status: DC
  Administered 2023-06-11 – 2023-06-14 (×4): 1
  Filled 2023-06-11 (×4): qty 1

## 2023-06-11 MED ORDER — FAMOTIDINE 20 MG PO TABS
10.0000 mg | ORAL_TABLET | Freq: Every day | ORAL | Status: DC
  Administered 2023-06-11 – 2023-06-14 (×4): 10 mg
  Filled 2023-06-11 (×4): qty 1

## 2023-06-11 MED ORDER — CARVEDILOL 12.5 MG PO TABS
12.5000 mg | ORAL_TABLET | Freq: Two times a day (BID) | ORAL | Status: DC
  Administered 2023-06-11: 12.5 mg via ORAL
  Filled 2023-06-11: qty 1

## 2023-06-11 MED ORDER — DOCUSATE SODIUM 50 MG/5ML PO LIQD: 50.0000 mg | Freq: Two times a day (BID) | ORAL | Status: DC | PRN

## 2023-06-11 MED ORDER — AMLODIPINE BESYLATE 10 MG PO TABS: 10.0000 mg | ORAL_TABLET | Freq: Every day | ORAL | Status: DC

## 2023-06-11 MED ORDER — GUAIFENESIN-CODEINE 100-10 MG/5ML PO SOLN: 10.0000 mL | Freq: Four times a day (QID) | ORAL | Status: DC | PRN

## 2023-06-11 MED ORDER — HYDRALAZINE HCL 50 MG PO TABS: 50.0000 mg | ORAL_TABLET | Freq: Three times a day (TID) | ORAL | Status: DC

## 2023-06-11 NOTE — Progress Notes (Signed)
 RT took pt off bipap and placed pt back on Heated HFNC. Pt tolerating it well at this time. RT will continue to monitor. Bipap @ bedside if needed.

## 2023-06-11 NOTE — Progress Notes (Signed)
 Increased RR, WOB, lower sats. Placed back on bipap with improvement quickly. Precedex  restarted to help him tolerate BiPAP.  [name] SHAUNNA Gaskins, DO 06/11/23 12:35 PM [facility]  For contact information, see Amion. If no response to pager, please call PCCM consult pager. After hours, 7PM- 7AM, please call Elink.

## 2023-06-11 NOTE — Progress Notes (Signed)
 Patient ID: [name] [name], male   DOB: Sep 30, 1950, 73 y.o.   MRN: [id number] [facility] Progress Note   Assessment/ Plan:   1.  End-stage renal disease following acute kidney Injury on chronic kidney disease stage V: Started yesterday on CRRT after progressive worsening of azotemia and relative diuretic resistance.  Overnight with net negative ultrafiltration/volume and corresponding improvement of azotemia translating and improvement of his mental status.  I will increase the rate of ultrafiltration to 200 cc an hour for additional volume unloading and help streamline blood pressure control.  Conversion to tunneled hemodialysis catheter and pursuing permanent access/outpatient dialysis unit placement will be undertaken as he becomes more clinically stable and moves out of the ICU. 2.  Acute hypoxic respiratory failure: Appears to be from a combination of COPD exacerbation and progressive volume overload in the setting of decreasing renal function.  Currently on high flow oxygen  via nasal cannula with efforts at weaning and ongoing ultrafiltration with CRRT. 3.  Anemia: Secondary to chronic disease/iron  deficiency-status post intravenous iron  and earlier underwent PRBC transfusion.  Continue to monitor for overt blood loss and will start ESA. 4.  Hypertension: With significantly elevated blood pressures necessitating use of clevidipine  drip in addition to restarting oral antihypertensive agents.  Will augment blood pressure control with ultrafiltration.  Subjective:   Tolerated CRRT overnight without problems, more communicative this morning and states that he feels better.   Objective:   BP (!) 142/53   Pulse 92   Temp 98.1 F (36.7 C) (Oral)   Resp (!) 38   Ht 6' (1.829 m)   Wt 79.2 kg   SpO2 95%   BMI 23.68 kg/m   Intake/Output Summary (Last 24 hours) at 06/11/2023 0759 Last data filed at 06/11/2023 0700 Gross per 24 hour  Intake 2292.69 ml  Output 4927.1 ml  Net -2634.41  ml   Weight change:   Physical Exam: Gen: Chronically ill-appearing man on oxygen  via high flow nasal cannula.  On CRRT. CVS: Pulse regular rhythm, normal rate, S1 and S2 normal.  Right IJ temporary dialysis catheter Resp: Distant breath sounds bilaterally, no distinct rales or rhonchi Abd: Soft, flat, nontender, bowel sounds normal Ext: Trace-1+ bilateral lower extremity edema with 1+ upper extremity edema  Imaging: DG CHEST PORT 1 VIEW Result Date: 06/10/2023 CLINICAL DATA:  [id number] Encounter for central line placement [id number] EXAM: PORTABLE CHEST 1 VIEW COMPARISON:  06/09/2023 FINDINGS: Right internal jugular Vas-Cath has been placed with the tip in the SVC. No pneumothorax. Feeding tube noted entering the stomach, the distal aspect of the tube not visualized. Cardiomegaly with vascular congestion. Interstitial prominence could reflect interstitial edema. No confluent opacity or effusion. IMPRESSION: Right central line tip in the SVC.  No pneumothorax. Cardiomegaly, vascular congestion, question early interstitial edema. Electronically Signed   By: Franky Crease M.D.   On: 06/10/2023 17:28   DG Abd Portable 1V Result Date: 06/10/2023 CLINICAL DATA:  Check gastric catheter placement EXAM: PORTABLE ABDOMEN - 1 VIEW COMPARISON:  06/02/2023 FINDINGS: Weighted feeding catheter is noted extending into the stomach and into the proximal small bowel. The tip is not well visualized on this exam. Stable vascular calcifications are noted in the upper abdomen. IMPRESSION: Feeding catheter extending into the duodenum although the tip is not well visualized. Electronically Signed   By: Oneil Devonshire M.D.   On: 06/10/2023 11:14   DG CHEST PORT 1 VIEW Result Date: 06/09/2023 CLINICAL DATA:  10026 Shortness of breath 10026 EXAM: PORTABLE CHEST  1 VIEW COMPARISON:  CXR 06/08/23 FINDINGS: Unchanged small left pleural effusion and improved right-sided pleural effusion. No pneumothorax. Cardiomegaly. Prominent bilateral  interstitial opacities could represent pulmonary venous congestion or mild pulmonary edema. No focal airspace opacity. No radiographically apparent displaced rib fractures. Visualized upper abdomen unremarkable. IMPRESSION: Cardiomegaly with improved right-sided pleural effusion and unchanged left-sided pleural effusion no focal airspace opacity Electronically Signed   By: Lyndall Gore M.D.   On: 06/09/2023 08:33    Labs: BMET Recent Labs  Lab 06/06/23 0918 06/07/23 0509 06/08/23 0457 06/09/23 1000 06/10/23 0320 06/10/23 1415 06/10/23 1427 06/11/23 0430  NA 146* 143 144 146* 146* 145 143 141  K 4.8 5.2* 4.9 5.7* 4.9 4.8 4.6 4.3  CL 112* 110 109 108 108 108  --  102  CO2 18* 18* 20* 20* 18* 20*  --  22  GLUCOSE 147* 115* 107* 108* 120* 134*  --  130*  BUN 102* 105* 100* 117* 143* 152*  --  84*  CREATININE 5.59* 5.63* 5.25* 5.74* 6.22* 6.51*  --  3.35*  CALCIUM  10.2 10.0 10.0 10.1 9.6 9.4  --  9.1  PHOS  --   --  5.6* 8.4*  --  9.8*  --  4.7*   CBC Recent Labs  Lab 06/07/23 0509 06/09/23 1000 06/10/23 0320 06/10/23 1427 06/10/23 2020 06/11/23 0430  WBC 10.2 9.9 8.4  --   --  11.7*  NEUTROABS  --  8.7*  --   --   --   --   HGB 8.0* 7.6* 6.9* 6.5* 8.4* 8.9*  HCT 25.1* 24.5* 21.9* 19.0* 25.3* 26.4*  MCV 95.4 98.8 100.0  --   --  93.6  PLT 134* 113* 105*  --   --  103*    Medications:     sodium chloride    Intravenous Once   amLODipine   10 mg Oral Daily   amLODipine   10 mg Per Tube Daily   arformoterol   15 mcg Nebulization BID   atorvastatin   20 mg Oral Daily   budesonide  (PULMICORT ) nebulizer solution  0.25 mg Nebulization BID   carvedilol   12.5 mg Oral BID WC   Chlorhexidine  Gluconate Cloth  6 each Topical Daily   docusate  100 mg Oral BID   famotidine   10 mg Oral Daily   feeding supplement (PROSource TF20)  60 mL Per Tube TID   hydrALAZINE   50 mg Per Tube Q8H   isosorbide  mononitrate  15 mg Oral Daily   methylPREDNISolone  (SOLU-MEDROL ) injection  40 mg  Intravenous Daily   multivitamin with minerals  1 tablet Oral Daily   polyethylene glycol  17 g Oral Daily   revefenacin   175 mcg Nebulization Daily   thiamine   100 mg Per Tube Daily    Gordy Blanch, MD 06/11/2023, 7:59 AM

## 2023-06-11 NOTE — Progress Notes (Signed)
   06/11/23 1007  Adult Ventilator Settings  Vent Type Servo i  Vent Mode PCV;BIPAP  Set Rate 12 bmp  FiO2 (%) 40 %  IPAP 13 cmH20  EPAP 5 cmH20    Pt was placed back on BIPAP due to increased WOB. CCM and RN at bedside to witness. Pt is tolerating well at this time.

## 2023-06-11 NOTE — Progress Notes (Signed)
 PHARMACY - ANTICOAGULATION CONSULT NOTE  Pharmacy Consult for Eliquis > heparin   Indication: atrial fibrillation  No Known Allergies  Patient Measurements: Height: 6' (182.9 cm) Weight:  (tried to obtain bed weight one more time, continued bed error) IBW/kg (Calculated) : 77.6    Vital Signs: Temp: 98.1 F (36.7 C) (02/08 0334) Temp Source: Oral (02/08 0334) BP: 142/53 (02/08 0510) Pulse Rate: 91 (02/08 0830)  Labs: Recent Labs    06/09/23 1000 06/09/23 2035 06/10/23 0320 06/10/23 0934 06/10/23 1316 06/10/23 1415 06/10/23 1427 06/10/23 2020 06/11/23 0430  HGB 7.6*  --  6.9*  --   --   --  6.5* 8.4* 8.9*  HCT 24.5*  --  21.9*  --   --   --  19.0* 25.3* 26.4*  PLT 113*  --  105*  --   --   --   --   --  103*  APTT  --    < >  --  110* 41*  --   --   --  35  LABPROT 25.6*  --   --   --   --   --   --   --   --   INR 2.3*  --   --   --   --   --   --   --   --   HEPARINUNFRC  --   --   --  >1.10* >1.10*  --   --   --   --   CREATININE 5.74*  --  6.22*  --   --  6.51*  --   --  3.35*   < > = values in this interval not displayed.    Estimated Creatinine Clearance: 22.5 mL/min (A) (by C-G formula based on SCr of 3.35 mg/dL (H)).   Medical History: Past Medical History:  Diagnosis Date   AAA (abdominal aortic aneurysm) (HCC)    a. s/p repair 06/2015 with left renal artery bypass at that time, post-op course c/b renal failure requiring dialysis, C dif.   Anemia    Arteriosclerotic cardiovascular disease (ASCVD)    a. AMI in 2000 treated at [facility]. b. cath in 12/2006->  Chronic total obstruction of the RCA;  drug-eluting stent placed in OM1, LVEF abnormal.   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 Pt is on warfarin PTA. Previously Eliquis  copay was not affordable. However, upon copay check this admission, Eliquis  $0/mo. Therefore pharmacy consulted to switch anticoagulation from warfarin to Eliquis .  2/6- noted with increased work of breathing on bipap. Changing apixaban  (last dose 2/5 at 8pm)  to IV heparin    2/7- UFH IV infusion held for anemia requiring transfusion. 2/8 - Hgb at 8.1. OK to resume UFH at conservative dose with no bolus.  Goal of Therapy:  Heparin  level 0.3-0.7 units/ml aPTT 66-102 seconds Monitor platelets by anticoagulation protocol: Yes  Plan:  Resume UFH gtt at 950 units/hour with no bolus F/u 8-hour aPTT + HL (dc aPTT if corresponding) Monitor daily CBC and signs for bleeding  Signe Dawn, PharmD PGY2 Cardiology Pharmacy Resident 06/11/2023  9:52 AM

## 2023-06-11 NOTE — Progress Notes (Signed)
 PHARMACY - ANTICOAGULATION Pharmacy Consult for heparin   Indication: atrial fibrillation Brief A/P: aPTT within goal range Continue Heparin  at current rate   No Known Allergies  Patient Measurements: Height: 6' (182.9 cm) Weight:  (tried to obtain bed weight one more time, continued bed error) IBW/kg (Calculated) : 77.6    Vital Signs: Temp: 98.6 F (37 C) (02/08 2000) Temp Source: Axillary (02/08 2000) BP: 154/59 (02/08 2118) Pulse Rate: 65 (02/08 2330)  Labs: Recent Labs    06/09/23 1000 06/09/23 2035 06/10/23 0320 06/10/23 0934 06/10/23 1316 06/10/23 1415 06/10/23 1427 06/10/23 2020 06/11/23 0430 06/11/23 1729 06/11/23 2257  HGB 7.6*  --  6.9*  --   --   --  6.5* 8.4* 8.9*  --   --   HCT 24.5*  --  21.9*  --   --   --  19.0* 25.3* 26.4*  --   --   PLT 113*  --  105*  --   --   --   --   --  103*  --   --   APTT  --    < >  --  110* 41*  --   --   --  35 68* 73*  LABPROT 25.6*  --   --   --   --   --   --   --   --   --   --   INR 2.3*  --   --   --   --   --   --   --   --   --   --   HEPARINUNFRC  --   --   --  >1.10* >1.10*  --   --   --   --  >1.10*  --   CREATININE 5.74*  --  6.22*  --   --  6.51*  --   --  3.35* 2.19*  --    < > = values in this interval not displayed.    Estimated Creatinine Clearance: 34.4 mL/min (A) (by C-G formula based on SCr of 2.19 mg/dL (H)).   Assessment: 73 y.o. male with h/o Afib, Eliquis  on hold, for heparin   Goal of Therapy:  Heparin  level 0.3-0.7 units/ml aPTT 66-102 seconds Monitor platelets by anticoagulation protocol: Yes  Plan:  No change to heparin   Cathlyn Arrant, PharmD, BCPS  06/11/2023  11:36 PM

## 2023-06-11 NOTE — Progress Notes (Signed)
 PHARMACY - ANTICOAGULATION CONSULT NOTE  Pharmacy Consult for Eliquis > heparin   Indication: atrial fibrillation  No Known Allergies  Patient Measurements: Height: 6' (182.9 cm) Weight:  (tried to obtain bed weight one more time, continued bed error) IBW/kg (Calculated) : 77.6    Vital Signs: Pulse Rate: 69 (02/08 1800)  Labs: Recent Labs    06/09/23 1000 06/09/23 2035 06/10/23 0320 06/10/23 0934 06/10/23 1316 06/10/23 1415 06/10/23 1427 06/10/23 2020 06/11/23 0430 06/11/23 1729  HGB 7.6*  --  6.9*  --   --   --  6.5* 8.4* 8.9*  --   HCT 24.5*  --  21.9*  --   --   --  19.0* 25.3* 26.4*  --   PLT 113*  --  105*  --   --   --   --   --  103*  --   APTT  --    < >  --  110* 41*  --   --   --  35 68*  LABPROT 25.6*  --   --   --   --   --   --   --   --   --   INR 2.3*  --   --   --   --   --   --   --   --   --   HEPARINUNFRC  --   --   --  >1.10* >1.10*  --   --   --   --  >1.10*  CREATININE 5.74*  --  6.22*  --   --  6.51*  --   --  3.35* 2.19*   < > = values in this interval not displayed.    Estimated Creatinine Clearance: 34.4 mL/min (A) (by C-G formula based on SCr of 2.19 mg/dL (H)).   Assessment: 73 yo M with PMH afib. Pt is on warfarin PTA. Previously Eliquis  copay was not affordable. However, upon copay check this admission, Eliquis  $0/mo. Therefore pharmacy consulted to switch anticoagulation from warfarin to Eliquis .  2/6- noted with increased work of breathing on bipap. Changing apixaban  (last dose 2/5 at 8pm) to IV heparin    2/7- UFH IV infusion held for anemia requiring transfusion. 2/8 - Hgb at 8.1. OK to resume UFH at conservative dose with no bolus.  Heparin  level >1.1 (as expected with recent apixaban  use), aPTT 68 sec (therapeutic) on infusion at 950 units/hr.  Goal of Therapy:  Heparin  level 0.3-0.7 units/ml aPTT 66-102 seconds Monitor platelets by anticoagulation protocol: Yes  Plan:  Continue heparin  gtt at 950 units/hour  Will f/u 6 hr  aPTT to confirm therapeutic  Vito Ralph, PharmD, BCPS Please see amion for complete clinical pharmacist phone list 06/11/2023  6:30 PM

## 2023-06-11 NOTE — Progress Notes (Signed)
   06/11/23 1722  Adult Ventilator Settings  Vent Type Servo i  Vent Mode PCV;BIPAP  Set Rate 12 bmp  FiO2 (%) 40 %  IPAP 13 cmH20  EPAP 5 cmH20    Pt was taken off of HHFNC and placed back on BIPAP due to increased WOB. Pt is tolerating well at this time.

## 2023-06-11 NOTE — Progress Notes (Signed)
 NAME:  [name] [name], MRN:  [id number], DOB:  01/05/1951, LOS: 10 ADMISSION DATE:  06/01/2023, CONSULTATION DATE:  06/02/2023 REFERRING MD:  Dr. Lawence CHIEF COMPLAINT:  Shortness of Breath   History of Present Illness:  [name] [name] is a 73 y.o. male with a PMH significant for HTN, HLD, AAA s/p repair, MI, CAD, Combined systolic/diastolic HF, A-Fib on coumadin , Stroke, PVD, COPD, Tobacco use, CKD stage IV, GERD, who presented to [facility] 1/29 with acute onset worsening dyspnea, associated dry cough, and wheezing.  Per chart review, he denied fever, chills, nausea, vomiting. Upon arrival he was in tachycardic, tachypneic, BP 174/64, and placed on Bipap. Labs revealed ABG PH 7.41, HCO3 17.1, Lacitc 4.8->4.6, INR 3.5, RVP negative, BUN 47, creat 4.4, Hgb 9.2. EKG with Afib RVR rate 119. A chest xray was obtained showing mild cardiomegaly, mild edema, and possible atypical pneumonia. Patient was started on Ceftriaxone  and Zithromax , given 125 mg Solu-Medrol  and 2.5 liter IVF bolus. He was planned to be admitted to the step down unit, however, with worsening hypoxia patient was intubated 1/30 and transferred to [facility] ICU for further management.   Pertinent  Medical History  HTN/HLD Afib on Coumadin  MI/CAD COPD Tobacco Use CKD stage IV  Significant Hospital Events: Including procedures, antibiotic start and stop dates in addition to other pertinent events   1/29: Acute respiratory on Bipap, requiring intubation. COPD exacerbation. Suspected PNA. Ceftriaxone /Zithromax /Steroids/Nebs 1/31 Weaned, extubated  2/6 Overnight w/ increase wob placed on bipap and given albumin /lasix . UOP last 24 hours 1.1L.  scr rising. difficulty speaking w/ wob still sig. HF team consulted. LVEF 35-40%. Recommended lasix  gtt. CVL placement for CVP and SCVO2 monitoring. Deemed not a candidate for mechanical support/advanced therapies. Anxiety making NIPPV difficult.  2/7 encephalopathic. Renal fxn worse. Acid base worse.  Hgb down. Got unit of blood. Did not tol NIPPV w/out being sedated. Placed on heated high flow. Decision made to attempt CRRT.   Interim History / Subjective:  Multiple loose BM overnight. SOB stable. Not wheezing. Later this morning feeling more SOB.  Objective   Blood pressure (!) 142/53, pulse 92, temperature 98.1 F (36.7 C), temperature source Oral, resp. rate (!) 38, height 6' (1.829 m), weight 79.2 kg, SpO2 95%. CVP:  [0 mmHg-29 mmHg] 6 mmHg  Vent Mode: BIPAP;PCV FiO2 (%):  [40 %] 40 % Set Rate:  [12 bmp] 12 bmp   Intake/Output Summary (Last 24 hours) at 06/11/2023 0730 Last data filed at 06/11/2023 0700 Gross per 24 hour  Intake 2292.69 ml  Output 4927.1 ml  Net -2634.41 ml   Filed Weights   06/09/23 0500 06/10/23 0500  Weight: 77.2 kg 79.2 kg   General ill appearing man lying in bed in NAD HENT  [place]/AT, eyes anicteric Pulm rhales bilaterally, no wheezing. No conversational dyspnea.  Card S1S2, irreg rhythm, reg rate Abd soft, NT Ext no edema, no cyanosis Gu foley with minimal clear yellow urine Neuro awake, speech hard to understand due to poor enunciation, not dysarthric. Answering questions appropriately. Globally weak.  Repeat exam- more tachypneic, symmetric breath sounds, rhales but no wheezing.    BUN 84, Cr 3.35 on CRRT WBC 11.7 H/H 8.9/26.4 Platelets 103 CXR personally reviewed> possible left layering effusion, no infiltrates, RIJ CVC, gastric tube   Resolved Hospital Problem list   Severe sepsis 2/2 PNA  hypernatremia  Assessment & Plan:  Acute hypoxic respiratory failure due to acute on chronic HFrEF and AECOPD and PNA  H/o Tobacco Use -HHFNC;  can use bipap PRN -pulling volume with CRRT -bronchodilators- brovana , pulmicort , yupelri  -con't solumedrol -pulmonary hygiene -repeat STAT CXR, duoneb STAT  AKI on CKD stage IV; likely cardiorenal syndrome Urinary retention  AGMA -con't CRRT; appreciate nephrology's management -renally dose meds, avoid  nephrotoxic meds -strict I/O -con't foley unless UOP stops  Acute on chronic HFrEF (echo 08/2019 50-55%, now 35-40%)  Essential HTN HLD -volume management with CRRT; failed lasix  trial -intolerant of spiro, Entresto with renal failure currently -cautiously continue coreg  with decompensated state -check coox -wean off cleviprex  for goal SBP <140 -con't coreg , imdur  -adding amlodipine  -increase enteral hydralazine    Paroxysmal atrial fibrillation with RVR > now controlled rate -if coox low, will switch coreg  for amiodarone  for rate control -tele monitoring -CRRT for electrolyte management -heparin  gtt  Anxiety w/ new onset delirium, acute metabolic encephalopathy due to uremic -weaned off precedex  overnight -zyprexa  PRN -trying to avoid benzos; haldol  1mg  IV q6h PRN for now; need to recheck QTc  Anemia of Chronic Kidney Disease -transfuse for Hb <7 or hemodynamically significant bleeding -monitr  Mild thrombocytopenia, stable -monitor, no need for transfusion currently  At risk for protein calorie malnutrition  -TF  Loose stools -stop bowel regimen  Best Practice (right click and Reselect all SmartList Selections daily)   Diet/type: tubefeeds DVT prophylaxis systemic heparin  Pressure ulcer(s): None GI prophylaxis: H2B Lines: Central line and Dialysis Catheter Foley:  Yes, and it is still needed Code Status:  DNR Family update Patient updated at beside 2/6 wife and patient updated at bedside. Both agreed dnr but okay for intubation. Would not want to be on vent/trach or dialysis long term. Consent obtained for intubation and dialysis catheter placement. This patient is critically ill with multiple organ system failure which requires frequent high complexity decision making, assessment, support, evaluation, and titration of therapies. This was completed through the application of advanced monitoring technologies and extensive interpretation of multiple databases.  During this encounter critical care time was devoted to patient care services described in this note for 38 minutes.  [name] SHAUNNA Gaskins, DO 06/11/23 7:32 AM [facility]  For contact information, see Amion. If no response to pager, please call PCCM consult pager. After hours, 7PM- 7AM, please call Elink.

## 2023-06-12 DIAGNOSIS — I5023 Acute on chronic systolic (congestive) heart failure: Secondary | ICD-10-CM | POA: Diagnosis not present

## 2023-06-12 DIAGNOSIS — J441 Chronic obstructive pulmonary disease with (acute) exacerbation: Secondary | ICD-10-CM | POA: Diagnosis not present

## 2023-06-12 DIAGNOSIS — Z515 Encounter for palliative care: Secondary | ICD-10-CM

## 2023-06-12 DIAGNOSIS — Z7189 Other specified counseling: Secondary | ICD-10-CM

## 2023-06-12 DIAGNOSIS — N179 Acute kidney failure, unspecified: Secondary | ICD-10-CM | POA: Diagnosis not present

## 2023-06-12 DIAGNOSIS — J9601 Acute respiratory failure with hypoxia: Secondary | ICD-10-CM | POA: Diagnosis not present

## 2023-06-12 LAB — RENAL FUNCTION PANEL
Albumin: 2.8 g/dL — ABNORMAL LOW (ref 3.5–5.0)
Albumin: 2.9 g/dL — ABNORMAL LOW (ref 3.5–5.0)
Anion gap: 9 (ref 5–15)
Anion gap: 12 (ref 5–15)
BUN: 49 mg/dL — ABNORMAL HIGH (ref 8–23)
BUN: 42 mg/dL — ABNORMAL HIGH (ref 8–23)
CO2: 24 mmol/L (ref 22–32)
CO2: 23 mmol/L (ref 22–32)
Calcium: 8.8 mg/dL — ABNORMAL LOW (ref 8.9–10.3)
Calcium: 8.9 mg/dL (ref 8.9–10.3)
Chloride: 105 mmol/L (ref 98–111)
Chloride: 102 mmol/L (ref 98–111)
Creatinine, Ser: 1.9 mg/dL — ABNORMAL HIGH (ref 0.61–1.24)
Creatinine, Ser: 1.65 mg/dL — ABNORMAL HIGH (ref 0.61–1.24)
GFR, Estimated: 37 mL/min — ABNORMAL LOW (ref >60)
GFR, Estimated: 44 mL/min — ABNORMAL LOW (ref >60)
Glucose, Bld: 103 mg/dL — ABNORMAL HIGH (ref 70–99)
Glucose, Bld: 128 mg/dL — ABNORMAL HIGH (ref 70–99)
Phosphorus: 3.8 mg/dL (ref 2.5–4.6)
Phosphorus: 2.8 mg/dL (ref 2.5–4.6)
Potassium: 4.7 mmol/L (ref 3.5–5.1)
Potassium: 4.7 mmol/L (ref 3.5–5.1)
Sodium: 138 mmol/L (ref 135–145)
Sodium: 137 mmol/L (ref 135–145)

## 2023-06-12 LAB — CBC
HCT: 28.6 % — ABNORMAL LOW (ref 39.0–52.0)
Hemoglobin: 9.4 g/dL — ABNORMAL LOW (ref 13.0–17.0)
MCH: 30.3 pg (ref 26.0–34.0)
MCHC: 32.9 g/dL (ref 30.0–36.0)
MCV: 92.3 fL (ref 80.0–100.0)
Platelets: 77 10*3/uL — ABNORMAL LOW (ref 150–400)
RBC: 3.1 MIL/uL — ABNORMAL LOW (ref 4.22–5.81)
RDW: 15.1 % (ref 11.5–15.5)
WBC: 10.5 10*3/uL (ref 4.0–10.5)
nRBC: 0.2 % (ref 0.0–0.2)

## 2023-06-12 LAB — GLUCOSE, CAPILLARY
Glucose-Capillary: 93 mg/dL (ref 70–99)
Glucose-Capillary: 77 mg/dL (ref 70–99)
Glucose-Capillary: 88 mg/dL (ref 70–99)
Glucose-Capillary: 118 mg/dL — ABNORMAL HIGH (ref 70–99)
Glucose-Capillary: 112 mg/dL — ABNORMAL HIGH (ref 70–99)

## 2023-06-12 LAB — APTT
aPTT: 74 s — ABNORMAL HIGH (ref 24–36)
aPTT: 79 s — ABNORMAL HIGH (ref 24–36)

## 2023-06-12 LAB — MAGNESIUM: Magnesium: 2.7 mg/dL — ABNORMAL HIGH (ref 1.7–2.4)

## 2023-06-12 LAB — HEPARIN LEVEL (UNFRACTIONATED): Heparin Unfractionated: >1.1 [IU]/mL — ABNORMAL HIGH (ref 0.30–0.70)

## 2023-06-12 MED ORDER — DARBEPOETIN ALFA 100 MCG/0.5ML IJ SOSY
100.0000 ug | PREFILLED_SYRINGE | INTRAMUSCULAR | Status: DC
  Administered 2023-06-12: 100 ug via SUBCUTANEOUS
  Filled 2023-06-12: qty 0.5

## 2023-06-12 MED ORDER — CARVEDILOL 6.25 MG PO TABS
6.2500 mg | ORAL_TABLET | Freq: Two times a day (BID) | ORAL | Status: DC
  Filled 2023-06-12: qty 1

## 2023-06-12 NOTE — Progress Notes (Signed)
 Patient ID: [name] [name], male   DOB: 1950-09-20, 73 y.o.   MRN: [id number] [facility] Progress Note   Assessment/ Plan:   1.  End-stage renal disease following acute kidney Injury on chronic kidney disease stage V: Started yesterday on CRRT after progressive worsening of azotemia and relative diuretic resistance.  Overnight with net negative ultrafiltration/volume and corresponding improvement of azotemia translating and improvement of his mental status.  Continue CRRT at the current prescription with efforts at volume unloading to allow for decreased oxygen  supplementation.  Plan to discontinue this modality and transition to intermittent hemodialysis in the next 24 to 48 hours based on clinical progress.  Pursue long-term dialysis access based on clinical trajectory. 2.  Acute hypoxic respiratory failure: Appears to be from a combination of COPD exacerbation and progressive volume overload in the setting of decreasing renal function.  Currently on high flow oxygen  via nasal cannula with efforts at weaning and ongoing ultrafiltration with CRRT. 3.  Anemia: Secondary to chronic disease/iron  deficiency-status post intravenous iron  and earlier underwent PRBC transfusion.  Started on ESA. 4.  Hypertension: Continue ultrafiltration with CRRT to augment blood pressure control while on antihypertensive agents.  Subjective:   Transiently required NIPPV yesterday because of increasing work of breathing/hypoxia.   Objective:   BP (!) 117/52   Pulse 70   Temp 97.7 F (36.5 C) (Axillary)   Resp (!) 26   Ht 6' (1.829 m)   Wt 72.2 kg   SpO2 100%   BMI 21.59 kg/m   Intake/Output Summary (Last 24 hours) at 06/12/2023 0815 Last data filed at 06/12/2023 0800 Gross per 24 hour  Intake 815.88 ml  Output 4834.7 ml  Net -4018.82 ml   Weight change:   Physical Exam: Gen: Chronically sick appearing man on oxygen  via nasal cannula and CRRT CVS: Pulse regular rhythm, normal rate, S1 and S2  normal.  Right IJ temporary dialysis catheter Resp: Distant breath sounds bilaterally, no distinct rales or rhonchi Abd: Soft, flat, nontender, bowel sounds normal Ext: Trace lower extremity edema with 1+ dependent and upper extremity edema  Imaging: DG CHEST PORT 1 VIEW Result Date: 06/11/2023 CLINICAL DATA:  Hypoxia and shortness of breath. EXAM: PORTABLE CHEST 1 VIEW COMPARISON:  Chest x-ray from same day 0556 hours. FINDINGS: Unchanged right internal jugular central venous catheter with tip in the mid SVC. Unchanged feeding tube entering the stomach with the tip below the field of view. Stable cardiomegaly. Unchanged mild diffuse interstitial thickening and trace left pleural effusion. No consolidation or pneumothorax. No acute osseous abnormality. IMPRESSION: 1. Unchanged mild interstitial pulmonary edema and trace left pleural effusion. Electronically Signed   By: Elsie ONEIDA Shoulder M.D.   On: 06/11/2023 10:27   DG Chest Port 1 View Result Date: 06/11/2023 CLINICAL DATA:  Respiratory failure. EXAM: PORTABLE CHEST 1 VIEW COMPARISON:  06/10/2023. FINDINGS: The cardio pericardial silhouette is enlarged. Mild vascular congestion without overt edema. Retrocardiac atelectasis with small left effusion. Right IJ central line tip overlies the proximal SVC level. A feeding tube passes into the stomach although the distal tip position is not included on the film. Telemetry leads overlie the chest. IMPRESSION: 1. Retrocardiac atelectasis with small left effusion. 2. Mild vascular congestion without overt edema. Electronically Signed   By: Camellia Candle M.D.   On: 06/11/2023 10:09   DG CHEST PORT 1 VIEW Result Date: 06/10/2023 CLINICAL DATA:  [id number] Encounter for central line placement [id number] EXAM: PORTABLE CHEST 1 VIEW COMPARISON:  06/09/2023 FINDINGS: Right internal  jugular Vas-Cath has been placed with the tip in the SVC. No pneumothorax. Feeding tube noted entering the stomach, the distal aspect of the tube not  visualized. Cardiomegaly with vascular congestion. Interstitial prominence could reflect interstitial edema. No confluent opacity or effusion. IMPRESSION: Right central line tip in the SVC.  No pneumothorax. Cardiomegaly, vascular congestion, question early interstitial edema. Electronically Signed   By: Franky Crease M.D.   On: 06/10/2023 17:28   DG Abd Portable 1V Result Date: 06/10/2023 CLINICAL DATA:  Check gastric catheter placement EXAM: PORTABLE ABDOMEN - 1 VIEW COMPARISON:  06/02/2023 FINDINGS: Weighted feeding catheter is noted extending into the stomach and into the proximal small bowel. The tip is not well visualized on this exam. Stable vascular calcifications are noted in the upper abdomen. IMPRESSION: Feeding catheter extending into the duodenum although the tip is not well visualized. Electronically Signed   By: Oneil Devonshire M.D.   On: 06/10/2023 11:14    Labs: BMET Recent Labs  Lab 06/08/23 0457 06/09/23 1000 06/10/23 0320 06/10/23 1415 06/10/23 1427 06/11/23 0430 06/11/23 1729 06/12/23 0350  NA 144 146* 146* 145 143 141 137 138  K 4.9 5.7* 4.9 4.8 4.6 4.3 5.0 4.7  CL 109 108 108 108  --  102 103 105  CO2 20* 20* 18* 20*  --  22 23 24   GLUCOSE 107* 108* 120* 134*  --  130* 120* 103*  BUN 100* 117* 143* 152*  --  84* 55* 49*  CREATININE 5.25* 5.74* 6.22* 6.51*  --  3.35* 2.19* 1.90*  CALCIUM  10.0 10.1 9.6 9.4  --  9.1 8.8* 8.8*  PHOS 5.6* 8.4*  --  9.8*  --  4.7* 3.4 3.8   CBC Recent Labs  Lab 06/09/23 1000 06/10/23 0320 06/10/23 1427 06/10/23 2020 06/11/23 0430 06/12/23 0350  WBC 9.9 8.4  --   --  11.7* 10.5  NEUTROABS 8.7*  --   --   --   --   --   HGB 7.6* 6.9* 6.5* 8.4* 8.9* 9.4*  HCT 24.5* 21.9* 19.0* 25.3* 26.4* 28.6*  MCV 98.8 100.0  --   --  93.6 92.3  PLT 113* 105*  --   --  103* 77*    Medications:     sodium chloride    Intravenous Once   amLODipine   10 mg Per Tube Daily   arformoterol   15 mcg Nebulization BID   atorvastatin   20 mg Per Tube  Daily   budesonide  (PULMICORT ) nebulizer solution  0.25 mg Nebulization BID   carvedilol   12.5 mg Per Tube BID WC   Chlorhexidine  Gluconate Cloth  6 each Topical Daily   famotidine   10 mg Per Tube Daily   feeding supplement (PROSource TF20)  60 mL Per Tube TID   hydrALAZINE   50 mg Per Tube Q8H   isosorbide  mononitrate  15 mg Oral Daily   methylPREDNISolone  (SOLU-MEDROL ) injection  40 mg Intravenous Daily   multivitamin with minerals  1 tablet Per Tube Daily   revefenacin   175 mcg Nebulization Daily   thiamine   100 mg Per Tube Daily    Gordy Blanch, MD 06/12/2023, 8:15 AM

## 2023-06-12 NOTE — Progress Notes (Signed)
 NAME:  FUAD FORGET, MRN:  [id number], DOB:  11-Oct-1950, LOS: 11 ADMISSION DATE:  06/01/2023, CONSULTATION DATE:  06/02/2023 REFERRING MD:  Dr. Lawence CHIEF COMPLAINT:  Shortness of Breath   History of Present Illness:  Vonnie Ligman is a 73 y.o. male with a PMH significant for HTN, HLD, AAA s/p repair, MI, CAD, Combined systolic/diastolic HF, A-Fib on coumadin , Stroke, PVD, COPD, Tobacco use, CKD stage IV, GERD, who presented to [facility] ER 1/29 with acute onset worsening dyspnea, associated dry cough, and wheezing.  Per chart review, he denied fever, chills, nausea, vomiting. Upon arrival he was in tachycardic, tachypneic, BP 174/64, and placed on Bipap. Labs revealed ABG PH 7.41, HCO3 17.1, Lacitc 4.8->4.6, INR 3.5, RVP negative, BUN 47, creat 4.4, Hgb 9.2. EKG with Afib RVR rate 119. A chest xray was obtained showing mild cardiomegaly, mild edema, and possible atypical pneumonia. Patient was started on Ceftriaxone  and Zithromax , given 125 mg Solu-Medrol  and 2.5 liter IVF bolus. He was planned to be admitted to the step down unit, however, with worsening hypoxia patient was intubated 1/30 and transferred to [facility] ICU for further management.   Pertinent  Medical History  HTN/HLD Afib on Coumadin  MI/CAD COPD Tobacco Use CKD stage IV  Significant Hospital Events: Including procedures, antibiotic start and stop dates in addition to other pertinent events   1/29: Acute respiratory on Bipap, requiring intubation. COPD exacerbation. Suspected PNA. Ceftriaxone /Zithromax /Steroids/Nebs 1/31 Weaned, extubated  2/6 Overnight w/ increase wob placed on bipap and given albumin /lasix . UOP last 24 hours 1.1L.  scr rising. difficulty speaking w/ wob still sig. HF team consulted. LVEF 35-40%. Recommended lasix  gtt. CVL placement for CVP and SCVO2 monitoring. Deemed not a candidate for mechanical support/advanced therapies. Anxiety making NIPPV difficult.  2/7 encephalopathic. Renal fxn worse. Acid base worse.  Hgb down. Got unit of blood. Did not tol NIPPV w/out being sedated. Placed on heated high flow. Decision made to attempt CRRT.  2/8 off and on bipap/ HHFNC  Interim History / Subjective:  Overnight no acute events.  Afebrile.  Off Cleviprex , continues on low-dose Precedex . He reports feeling like his breathing has improved some.   Objective   Blood pressure (!) 117/52, pulse 67, temperature 97.7 F (36.5 C), temperature source Axillary, resp. rate (!) 28, height 6' (1.829 m), weight 72.2 kg, SpO2 100%. CVP:  [2 mmHg-5 mmHg] 3 mmHg  Vent Mode: PCV;PRVC FiO2 (%):  [35 %-50 %] 50 % Set Rate:  [12 bmp] 12 bmp Plateau Pressure:  [7 cmH20] 7 cmH20   Intake/Output Summary (Last 24 hours) at 06/12/2023 0757 Last data filed at 06/12/2023 0700 Gross per 24 hour  Intake 889.99 ml  Output 5008.9 ml  Net -4118.91 ml   Filed Weights   06/10/23 0500 06/12/23 0400  Weight: 79.2 kg 72.2 kg   General chronically ill, frail appearing man lying in bed in NAD HENT  [place]/AT, eyes anicteric Pulm improving rhales, no wheezing. No accessory muscle use. Tachypnea slightly better. Card S1S2, reg rate, irreg rhtyhm Abd soft, NT Ext + mild edema, no cyanosis Gu  minimal dark UOP Neuro awake, alert, answering questions. Globally weak.   BUN 49, Cr 1.90 on CRRT WBC 10.5  H/H 9.4/28.6 Platelets 77  PTT 74   Resolved Hospital Problem list   Severe sepsis 2/2 PNA  hypernatremia  Assessment & Plan:  Acute hypoxic respiratory failure due to acute on chronic HFrEF and AECOPD and PNA  H/o Tobacco Use -Continue heated high flow nasal cannula,  wean FiO2 as able.  BiPAP as needed> has been needing less today. - Increasing ultrafiltration with CRRT - Continue triple inhaled therapy-Brovana , Yupelri , Pulmicort  - Continue Solu-Medrol  - Pulmonary hygiene  AKI on CKD stage IV; likely cardiorenal syndrome Urinary retention  AGMA -Continue CRRT, appreciate nephrology's management - Strict I's/O - Renally dose  meds and avoid nephrotoxic meds - Continue Foley unless urine output ceases  Acute on chronic HFrEF (echo 08/2019 50-55%, now 35-40%)  Essential HTN HLD -Volume management with CRRT - Hold spironolactone, Entresto due to renal failure - Hold Coreg , Imdur , hydralazine , amlodipine  today with lower blood pressures since he is still getting Precedex , which lowers blood pressure  Paroxysmal atrial fibrillation with RVR > now controlled rate -Precedex , hold Coreg  for now.  Resume if Precedex  stops. - Telemetry monitoring - CRRT for electrolyte management volume management - Continue heparin  infusion  Anxiety w/ new onset delirium, acute metabolic encephalopathy due to uremic -Continue Precedex  as needed to tolerate BiPAP - Trying to avoid benzos.  Haldol  as needed. -CRRT for metabolic clearance  Anemia of Chronic Kidney Disease -Transfuse for hemoglobin less than 7 or hemodynamically significant bleeding - Continue to monitor  Mild thrombocytopenia, stable -Monitor, no current need for transfusions  At risk for protein calorie malnutrition  -Continue tube feeds  Loose stools -Holding bowel regimen currently  Deconditioning - Bed mobility - PT, OT  Goals of care - Appreciate palliative care's management.  Wife is surrogate management consultant.  POA paperwork to be completed, although by law she would be the default decision-maker.  Best Practice (right click and Reselect all SmartList Selections daily)   Diet/type: tubefeeds DVT prophylaxis systemic heparin  Pressure ulcer(s): None GI prophylaxis: H2B Lines: Dialysis Catheter Foley:  Yes, and it is still needed Code Status:  DNR Family update Patient updated at beside 2/6 wife and patient updated at bedside. Both agreed dnr but okay for intubation. Would not want to be on vent/trach or dialysis long term. Consent obtained for intubation and dialysis catheter placement.  This patient is critically ill with multiple organ  system failure which requires frequent high complexity decision making, assessment, support, evaluation, and titration of therapies. This was completed through the application of advanced monitoring technologies and extensive interpretation of multiple databases. During this encounter critical care time was devoted to patient care services described in this note for 35 minutes.  Leita SHAUNNA Gaskins, DO 06/12/23 4:18 PM [facility]  For contact information, see Amion. If no response to pager, please call PCCM consult pager. After hours, 7PM- 7AM, please call Elink.

## 2023-06-12 NOTE — Consult Note (Signed)
 Palliative Care Consult Note                                  Date: 06/12/2023   Patient Name: [name] [name]  DOB: [birth year]  MRN: [id number]  Age / Sex: 73 y.o., male  PCP: Tobie Suzzane POUR, MD Referring Physician: Gretta Leita SQUIBB, DO  Reason for Consultation: Establishing goals of care  HPI/Patient Profile: 73 y.o. male  with past medical history of HTN, HLD, AAA s/p repair, MI, CAD, Combined systolic/diastolic HF, A-Fib on coumadin , Stroke, PVD, COPD, Tobacco use, CKD stage IV, and GERD admitted on 06/01/2023 to [facility] ED with acute onset worsening dyspnea, associated dry cough, and wheezing.   Past Medical History:  Diagnosis Date   AAA (abdominal aortic aneurysm) (HCC)    a. s/p repair 06/2015 with left renal artery bypass at that time, post-op course c/b renal failure requiring dialysis, C dif.   Anemia    Arteriosclerotic cardiovascular disease (ASCVD)    a. AMI in 2000 treated at [facility]. b. cath in 12/2006->  Chronic total obstruction of the RCA;  drug-eluting stent placed in OM1, LVEF abnormal.   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  I introduced Palliative Medicine as specialized medical care for people living with serious illness. It focuses on providing relief from symptoms and stress of a serious illness. The goal is to improve quality of life for both the patient and the family.  Today's Discussion: Patient and his wife have a good understanding of his chronic conditions and acute hospitalization. They understand his comorbidities and how he has several serious conditions. The patient is temporarily trialing CRRT. He is alternating between BiPAP and HHF. He asked to be changed to [facility] so he could participate in our discussion and he was very short of breath and had increased WOB during that time.  Although the patient has many chronic conditions they have been managing these in the outpatient setting. One month ago the patient started to have increasing symptoms such as  shortness of breath and fatigue. Before that he was living independently with his wife of 45 years. He was able to manage all his ADLs. Although he was completing his ADLs independently, he would take longer and longer to recover afterwards. He shares his appetite has been poor for some time now. 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 He enjoyed kayaking in his retirement and last [place] around 2 years ago.  A discussion was had today regarding advanced directives. The patient shares that he wants his wife to be his proxy decision if he were unable to make decisions. HE would like to get this formalized in a HCPOA document. Spiritual care consult placed for assistance.We discussed code status and patient confirms his DNR status. The patient shares that he would  never want to be a vegetable or be stuck on machines. He shares that he and his wife have had thorough discussions about this in the past. The patient shares that his quality of life is more important than his quantity of life. At first he states he would not want to be intubated but then shares he would not want to be intubated long-- he did not put a time frame on intubation but the patient and his wife share that they understand how serious his conditions are and that if he were unable to have quality of life he would likely stop aggressive interventions and transition to comfort measures. The difference between a aggressive medical intervention path and a palliative comfort care path for this patient at this time was had. We discussed taking things one day at a time.  Discussed the importance of continued conversation with family and the medical providers regarding overall plan of care and treatment options, ensuring decisions are within the context of the patient's values and GOCs.  Questions and concerns were addressed. Hard Choices booklet left for review. The family was encouraged to call with questions or concerns. PMT will continue to support holistically.  Review of Systems  Constitutional:  Positive for activity change and fatigue.  Respiratory:  Positive for shortness of breath.     Objective:   Primary Diagnoses: Present on Admission:  Acute respiratory failure with hypoxia (HCC)  Sepsis due to pneumonia (HCC)  Dyslipidemia  Essential hypertension  COPD with acute exacerbation (HCC)  Paroxysmal atrial fibrillation with RVR (HCC)  Respiratory failure (HCC)   Physical Exam Vitals reviewed.  Constitutional:      Appearance: He is ill-appearing.     Interventions: Nasal cannula in place.     Comments: HHF  HENT:     Head: Normocephalic and atraumatic.  Cardiovascular:     Rate and Rhythm: Normal rate.  Pulmonary:     Effort: Tachypnea and accessory muscle usage present.   Skin:    General: Skin is warm and dry.  Neurological:     Mental Status: He is alert and oriented to person, place, and time.  Psychiatric:        Mood and Affect: Mood normal.        Behavior: Behavior normal.     Vital Signs:  BP (!) 95/54   Pulse 76   Temp 98 F (36.7 C) (Oral)   Resp (!) 23   Ht 6' (1.829 m)   Wt 72.2 kg   SpO2 96%   BMI 21.59 kg/m     Advanced Care Planning:   Existing Vynca/ACP Documentation: None  Primary Decision Maker: PATIENT. Patient would like to complete  HCPOA paperwork naming his wife Tammy Hasegawa as his proxy management consultant.  Code Status/Advance Care Planning: DNR  Assessment & Plan:   SUMMARY OF RECOMMENDATIONS   DNR Full scope of care Would not want long term intubation Spiritual care consult: HCPOA documents Continued PMT support   Discussed with: Dr. Gretta and bedside RN  Time Total: 90 minutes   Thank you for allowing us  to participate in the care of Zelig E Stachowski PMT will continue to support holistically.   Signed by: Stephane Palin, NP Palliative Medicine Team  Team Phone # [phone] (Nights/Weekends)  06/12/2023, 1:42 PM

## 2023-06-12 NOTE — Progress Notes (Signed)
 PHARMACY - ANTICOAGULATION Pharmacy Consult for heparin   Indication: atrial fibrillation   No Known Allergies  Patient Measurements: Height: 6' (182.9 cm) Weight: 72.2 kg (159 lb 2.8 oz) IBW/kg (Calculated) : 77.6  Vital Signs: Temp: 98 F (36.7 C) (02/09 0830) Temp Source: Oral (02/09 0830) BP: 117/52 (02/09 0326) Pulse Rate: 70 (02/09 0800)  Labs: Recent Labs    06/09/23 1000 06/09/23 2035 06/10/23 0320 06/10/23 0934 06/10/23 1316 06/10/23 1415 06/10/23 2020 06/11/23 0430 06/11/23 1729 06/11/23 2257 06/12/23 0350 06/12/23 0803  HGB 7.6*  --  6.9*  --   --    < > 8.4* 8.9*  --   --  9.4*  --   HCT 24.5*  --  21.9*  --   --    < > 25.3* 26.4*  --   --  28.6*  --   PLT 113*  --  105*  --   --   --   --  103*  --   --  77*  --   APTT  --    < >  --    < > 41*  --   --  35 68* 73* 74* 79*  LABPROT 25.6*  --   --   --   --   --   --   --   --   --   --   --   INR 2.3*  --   --   --   --   --   --   --   --   --   --   --   HEPARINUNFRC  --   --   --    < > >1.10*  --   --   --  >1.10*  --  >1.10*  --   CREATININE 5.74*  --  6.22*  --   --    < >  --  3.35* 2.19*  --  1.90*  --    < > = values in this interval not displayed.    Estimated Creatinine Clearance: 36.9 mL/min (A) (by C-G formula based on SCr of 1.9 mg/dL (H)).   Assessment: 73 yo M with PMH afib. Pt is on warfarin PTA. Previously Eliquis  copay was not affordable. However, upon copay check this admission, Eliquis  $0/mo. Therefore pharmacy consulted to switch anticoagulation from warfarin to Eliquis .   2/6- noted with increased work of breathing on bipap. Changing apixaban  (last dose 2/5 at 8pm) to IV heparin     2/7- UFH IV infusion held for anemia requiring transfusion. 2/8 - Hgb at 8.1. OK to resume UFH at conservative dose with no bolus.   aPTT 79 sec (therapeutic) on infusion at 950 units/hr. CBC stable. No signs of bleeding today.  Goal of Therapy:  Heparin  level 0.3-0.7 units/ml aPTT 66-102  seconds Monitor platelets by anticoagulation protocol: Yes  Plan:  Continue heparin  gtt at 950 units/hour  Will f/u 6 hr aPTT to confirm therapeutic  Signe Dawn, PharmD PGY2 Cardiology Pharmacy Resident  06/12/2023  8:59 AM

## 2023-06-12 NOTE — Progress Notes (Signed)
     Referral received for [name] [name]: goals of care discussion. Chart reviewed and updates received from RN. Patient would like wife present for discussions.  Attempted to contact patient's wife Rock Cashwell. Unable to reach. Voicemail left with contact information given.   PMT will re-attempt to contact family at a later time/date. Detailed note and recommendations to follow once GOC has been completed.   Thank you for your referral and allowing PMT to assist in [name]'s care.   Stephane Palin, NP Palliative Medicine Team  Team Phone # [phone]   NO CHARGE

## 2023-06-13 ENCOUNTER — Inpatient Hospital Stay: Payer: 59

## 2023-06-13 DIAGNOSIS — N186 End stage renal disease: Secondary | ICD-10-CM | POA: Diagnosis not present

## 2023-06-13 DIAGNOSIS — N179 Acute kidney failure, unspecified: Secondary | ICD-10-CM | POA: Diagnosis not present

## 2023-06-13 DIAGNOSIS — Z515 Encounter for palliative care: Secondary | ICD-10-CM | POA: Diagnosis not present

## 2023-06-13 DIAGNOSIS — J9601 Acute respiratory failure with hypoxia: Secondary | ICD-10-CM | POA: Diagnosis not present

## 2023-06-13 DIAGNOSIS — J441 Chronic obstructive pulmonary disease with (acute) exacerbation: Secondary | ICD-10-CM | POA: Diagnosis not present

## 2023-06-13 DIAGNOSIS — I5023 Acute on chronic systolic (congestive) heart failure: Secondary | ICD-10-CM | POA: Diagnosis not present

## 2023-06-13 DIAGNOSIS — Z7189 Other specified counseling: Secondary | ICD-10-CM | POA: Diagnosis not present

## 2023-06-13 LAB — GLUCOSE, CAPILLARY
Glucose-Capillary: 107 mg/dL — ABNORMAL HIGH (ref 70–99)
Glucose-Capillary: 140 mg/dL — ABNORMAL HIGH (ref 70–99)
Glucose-Capillary: 160 mg/dL — ABNORMAL HIGH (ref 70–99)
Glucose-Capillary: 154 mg/dL — ABNORMAL HIGH (ref 70–99)

## 2023-06-13 LAB — CBC
HCT: 33.7 % — ABNORMAL LOW (ref 39.0–52.0)
Hemoglobin: 11 g/dL — ABNORMAL LOW (ref 13.0–17.0)
MCH: 30.4 pg (ref 26.0–34.0)
MCHC: 32.6 g/dL (ref 30.0–36.0)
MCV: 93.1 fL (ref 80.0–100.0)
Platelets: 82 10*3/uL — ABNORMAL LOW (ref 150–400)
RBC: 3.62 MIL/uL — ABNORMAL LOW (ref 4.22–5.81)
RDW: 14.6 % (ref 11.5–15.5)
WBC: 12.8 10*3/uL — ABNORMAL HIGH (ref 4.0–10.5)
nRBC: 0 % (ref 0.0–0.2)

## 2023-06-13 LAB — RENAL FUNCTION PANEL
Albumin: 3 g/dL — ABNORMAL LOW (ref 3.5–5.0)
Albumin: 3 g/dL — ABNORMAL LOW (ref 3.5–5.0)
Anion gap: 15 (ref 5–15)
Anion gap: 11 (ref 5–15)
BUN: 37 mg/dL — ABNORMAL HIGH (ref 8–23)
BUN: 44 mg/dL — ABNORMAL HIGH (ref 8–23)
CO2: 23 mmol/L (ref 22–32)
CO2: 22 mmol/L (ref 22–32)
Calcium: 9.5 mg/dL (ref 8.9–10.3)
Calcium: 9.2 mg/dL (ref 8.9–10.3)
Chloride: 101 mmol/L (ref 98–111)
Chloride: 103 mmol/L (ref 98–111)
Creatinine, Ser: 1.58 mg/dL — ABNORMAL HIGH (ref 0.61–1.24)
Creatinine, Ser: 1.94 mg/dL — ABNORMAL HIGH (ref 0.61–1.24)
GFR, Estimated: 47 mL/min — ABNORMAL LOW (ref >60)
GFR, Estimated: 37 mL/min — ABNORMAL LOW (ref >60)
Glucose, Bld: 131 mg/dL — ABNORMAL HIGH (ref 70–99)
Glucose, Bld: 166 mg/dL — ABNORMAL HIGH (ref 70–99)
Phosphorus: 2.7 mg/dL (ref 2.5–4.6)
Phosphorus: 2.4 mg/dL — ABNORMAL LOW (ref 2.5–4.6)
Potassium: 4.6 mmol/L (ref 3.5–5.1)
Potassium: 4.9 mmol/L (ref 3.5–5.1)
Sodium: 139 mmol/L (ref 135–145)
Sodium: 136 mmol/L (ref 135–145)

## 2023-06-13 LAB — HEPARIN LEVEL (UNFRACTIONATED): Heparin Unfractionated: 0.65 [IU]/mL (ref 0.30–0.70)

## 2023-06-13 LAB — MAGNESIUM: Magnesium: 2.7 mg/dL — ABNORMAL HIGH (ref 1.7–2.4)

## 2023-06-13 LAB — APTT: aPTT: 62 s — ABNORMAL HIGH (ref 24–36)

## 2023-06-13 NOTE — Progress Notes (Signed)
 NAME:  [name], MRN:  [id number], DOB:  1951/01/22, LOS: 12 ADMISSION DATE:  06/01/2023, CONSULTATION DATE:  06/02/2023 REFERRING MD:  Dr. Achilles Holes CHIEF COMPLAINT:  Shortness of Breath   History of Present Illness:  [name] [name] is a 73 y.o. male with a PMH significant for HTN, HLD, AAA s/p repair, MI, CAD, Combined systolic/diastolic HF, A-Fib on coumadin , Stroke, PVD, COPD, Tobacco use, CKD stage IV, GERD, who presented to [facility] ER 1/29 with acute onset worsening dyspnea, associated dry cough, and wheezing.  Per chart review, he denied fever, chills, nausea, vomiting. Upon arrival he was in tachycardic, tachypneic, BP 174/64, and placed on Bipap. Labs revealed ABG PH 7.41, HCO3 17.1, Lacitc 4.8->4.6, INR 3.5, RVP negative, BUN 47, creat 4.4, Hgb 9.2. EKG with Afib RVR rate 119. A chest xray was obtained showing mild cardiomegaly, mild edema, and possible atypical pneumonia. Patient was started on Ceftriaxone  and Zithromax , given 125 mg Solu-Medrol  and 2.5 liter IVF bolus. He was planned to be admitted to the step down unit, however, with worsening hypoxia patient was intubated 1/30 and transferred to [facility] ICU for further management.   Pertinent  Medical History  HTN/HLD Afib on Coumadin  MI/CAD COPD Tobacco Use CKD stage IV  Significant Hospital Events: Including procedures, antibiotic start and stop dates in addition to other pertinent events   1/29: Acute respiratory on Bipap, requiring intubation. COPD exacerbation. Suspected PNA. Ceftriaxone /Zithromax /Steroids/Nebs 1/31 Weaned, extubated  2/6 Overnight w/ increase wob placed on bipap and given albumin /lasix . UOP last 24 hours 1.1L.  scr rising. difficulty speaking w/ wob still sig. HF team consulted. LVEF 35-40%. Recommended lasix  gtt. CVL placement for CVP and SCVO2 monitoring. Deemed not a candidate for mechanical support/advanced therapies. Anxiety making NIPPV difficult.  2/7 encephalopathic. Renal fxn worse. Acid base worse.  Hgb down. Got unit of blood. Did not tol NIPPV w/out being sedated. Placed on heated high flow. Decision made to attempt CRRT.  2/8 off and on bipap/ HHFNC  Interim History / Subjective:  Remains on CRRT overnight.  Planning to take a CRRT holiday when his filter clots or expires per nephrology.  Afebrile overnight. He feels a little better than yesterday.   Objective   Blood pressure (!) 126/58, pulse 84, temperature (!) 97 F (36.1 C), temperature source Axillary, resp. rate (!) 33, height 6' (1.829 m), weight 68.9 kg, SpO2 92%. CVP:  [6 mmHg] 6 mmHg  Vent Mode: BIPAP;PCV FiO2 (%):  [40 %-50 %] 40 % Set Rate:  [12 bmp] 12 bmp PEEP:  [5 cmH20] 5 cmH20   Intake/Output Summary (Last 24 hours) at 06/13/2023 1116 Last data filed at 06/13/2023 1100 Gross per 24 hour  Intake 1252.48 ml  Output 3597.5 ml  Net -2345.02 ml   Filed Weights   06/12/23 0400 06/13/23 0705  Weight: 72.2 kg 68.9 kg   General chronically ill appearing man lying in bed in NAD HENT  [place]/AT, eyes anicteric Pulm rhales, no wheezing. Still tachypneic, but no accessory muscle use.  Card S1S2, RRR Abd soft, NT Ext less UE edema, minimal LE edema GU:  minimal UOP Neuro: awake, alert, globally weak. Moving extremities.   BUN 37, Cr 1.58 on CRRT WBC 12.8  H/H 11/33.7 Platelets 82 PTT 62  I/O  -9 L for the admission UOP 15cc  Resolved Hospital Problem list   Severe sepsis 2/2 PNA  hypernatremia  Assessment & Plan:  Acute hypoxic respiratory failure due to acute on chronic HFrEF and AECOPD and PNA  H/o Tobacco Use -con't alternating HHFNC and BiPAP -does not want intubation-- see ipal note -CRRT until it clots, then HD holiday. Would not want additional HD after this -con't nebs - yupelri , brovana , pulmicort , steriods -pulmonary hygiene  AKI on CKD stage IV; likely cardiorenal syndrome Urinary retention  AGMA --con't CRRT until clots; will updated Dr. Lydia Sams  -strict I/O -renally dose meds, avoid  nephrotoxic meds -con't foley  Acute on chronic HFrEF (echo 08/2019 50-55%, now 35-40%)  Essential HTN HLD -anticipate he will need lasix  to promote UOP after stopping CRRT -holding spiro, entresto due to renal failure -holding imdur , coreg , hydralazine , amlodipine  due to lower Bps on CRRT & precedex   Paroxysmal atrial fibrillation with RVR > now controlled rate -precedex  in use so coreg  had to be stopped due to low BPS -tele monitoring -heparin   Anxiety w/ new onset delirium, acute metabolic encephalopathy due to uremic -precedex  PRN -trying to avoid respiratory suppressing meds due to severe respiratory failure  Anemia of Chronic Kidney Disease transfuse for Hb <7 or hemodynamically significant bleeding  Mild thrombocytopenia, stable -monitor  At risk for protein calorie malnutrition  -TF via cortrak  Loose stools -hold bowel reg  Deconditioning - PT, OT, up to chair as tolerated   Goals of care -Does not want more HD if his renal function fails to return DNR & DNI.  Best Practice (right click and "Reselect all SmartList Selections" daily)   Diet/type: tubefeeds DVT prophylaxis systemic heparin  Pressure ulcer(s): None GI prophylaxis: H2B Lines: Dialysis Catheter Foley:  Yes, and it is still needed Code Status:  DNR Family update: Patient updated at beside 2/10 wife and patient updated at bedside.   This patient is critically ill with multiple organ system failure which requires frequent high complexity decision making, assessment, support, evaluation, and titration of therapies. This was completed through the application of advanced monitoring technologies and extensive interpretation of multiple databases. During this encounter critical care time was devoted to patient care services described in this note for 45 minutes.  Joesph Mussel, DO 06/13/23 2:39 PM [facility]  For contact information, see Amion. If no response to pager, please  call PCCM consult pager. After hours, 7PM- 7AM, please call Elink.

## 2023-06-13 NOTE — Progress Notes (Signed)
 Patient ID: [name] [name], male   DOB: May 04, 1950, 73 y.o.   MRN: [id number]  [facility] Progress Note   Assessment/ Plan:   1.  End-stage renal disease following acute kidney Injury on chronic kidney disease stage V: Started yesterday on CRRT after progressive worsening of azotemia and relative diuretic resistance.  Overnight with net negative ultrafiltration/volume and corresponding improvement of azotemia translating and improvement of his mental status.  Ultrafiltration of almost 3 L overnight (net ultrafiltration 9 L since starting CRRT 2 days ago).  Now with euvolemic if not possibly hypovolemic status and the plan is to discontinue CRRT today when the filter set expires.  We will then reevaluate his clinical status to decide on need for intermittent dialysis timing/frequency.  All this will be predicated by his respiratory status and clinical stability that will also help determine outpatient dialysis unit placement. 2.  Acute hypoxic respiratory failure: Appears to be from a combination of COPD exacerbation and progressive volume overload in the setting of decreasing renal function.  Currently on BiPAP.  With the lack of improvement of his respiratory status seen with aggressive ultrafiltration/net negative balance, I fear that his respiratory failure is primarily driven by COPD at this time. 3.  Anemia: Secondary to chronic disease/iron  deficiency-status post intravenous iron  and earlier underwent PRBC transfusion.  Started on ESA. 4.  Hypertension: Ultrafiltration goal switched to net even with intermittent hypotension overnight.  Subjective:   Intermittently on high flow oxygen /NIPPV.   Objective:   BP (!) 126/58   Pulse 78   Temp (!) 97 F (36.1 C) (Axillary)   Resp (!) 26   Ht 6' (1.829 m)   Wt 68.9 kg   SpO2 94%   BMI 20.60 kg/m   Intake/Output Summary (Last 24 hours) at 06/13/2023 0804 Last data filed at 06/13/2023 0800 Gross per 24 hour  Intake 1051.86 ml   Output 3935.3 ml  Net -2883.44 ml   Weight change:   Physical Exam: Gen: Chronically sick appearing man, appears fatigued on BiPAP CVS: Pulse regular rhythm, normal rate, S1 and S2 normal.  Right IJ temporary dialysis catheter connected to CRRT Resp: Distant breath sounds bilaterally, no distinct rales or rhonchi Abd: Soft, flat, nontender, bowel sounds normal Ext: Trace lower extremity edema with 1+ dependent and upper extremity edema  Imaging: DG CHEST PORT 1 VIEW Result Date: 06/11/2023 CLINICAL DATA:  Hypoxia and shortness of breath. EXAM: PORTABLE CHEST 1 VIEW COMPARISON:  Chest x-ray from same day 0556 hours. FINDINGS: Unchanged right internal jugular central venous catheter with tip in the mid SVC. Unchanged feeding tube entering the stomach with the tip below the field of view. Stable cardiomegaly. Unchanged mild diffuse interstitial thickening and trace left pleural effusion. No consolidation or pneumothorax. No acute osseous abnormality. IMPRESSION: 1. Unchanged mild interstitial pulmonary edema and trace left pleural effusion. Electronically Signed   By: Aleta Anda M.D.   On: 06/11/2023 10:27    Labs: BMET Recent Labs  Lab 06/09/23 1000 06/10/23 0320 06/10/23 1415 06/10/23 1427 06/11/23 0430 06/11/23 1729 06/12/23 0350 06/12/23 1600 06/13/23 0403  NA 146* 146* 145 143 141 137 138 137 139  K 5.7* 4.9 4.8 4.6 4.3 5.0 4.7 4.7 4.6  CL 108 108 108  --  102 103 105 102 101  CO2 20* 18* 20*  --  22 23 24 23 23   GLUCOSE 108* 120* 134*  --  130* 120* 103* 128* 131*  BUN 117* 143* 152*  --  84* 55*  49* 42* 37*  CREATININE 5.74* 6.22* 6.51*  --  3.35* 2.19* 1.90* 1.65* 1.58*  CALCIUM  10.1 9.6 9.4  --  9.1 8.8* 8.8* 8.9 9.5  PHOS 8.4*  --  9.8*  --  4.7* 3.4 3.8 2.8 2.7   CBC Recent Labs  Lab 06/09/23 1000 06/10/23 0320 06/10/23 1427 06/10/23 2020 06/11/23 0430 06/12/23 0350 06/13/23 0403  WBC 9.9 8.4  --   --  11.7* 10.5 12.8*  NEUTROABS 8.7*  --   --   --    --   --   --   HGB 7.6* 6.9*   < > 8.4* 8.9* 9.4* 11.0*  HCT 24.5* 21.9*   < > 25.3* 26.4* 28.6* 33.7*  MCV 98.8 100.0  --   --  93.6 92.3 93.1  PLT 113* 105*  --   --  103* 77* 82*   < > = values in this interval not displayed.    Medications:     sodium chloride    Intravenous Once   amLODipine   10 mg Per Tube Daily   arformoterol   15 mcg Nebulization BID   atorvastatin   20 mg Per Tube Daily   budesonide  (PULMICORT ) nebulizer solution  0.25 mg Nebulization BID   carvedilol   6.25 mg Per Tube BID WC   Chlorhexidine  Gluconate Cloth  6 each Topical Daily   darbepoetin (ARANESP ) injection - NON-DIALYSIS  100 mcg Subcutaneous Q Sun-1800   famotidine   10 mg Per Tube Daily   feeding supplement (PROSource TF20)  60 mL Per Tube TID   hydrALAZINE   50 mg Per Tube Q8H   isosorbide  mononitrate  15 mg Oral Daily   methylPREDNISolone  (SOLU-MEDROL ) injection  40 mg Intravenous Daily   multivitamin with minerals  1 tablet Per Tube Daily   revefenacin   175 mcg Nebulization Daily   thiamine   100 mg Per Tube Daily    Clevester Dally, MD 06/13/2023, 8:04 AM

## 2023-06-13 NOTE — Progress Notes (Signed)
 PHARMACY - ANTICOAGULATION Pharmacy Consult for heparin   Indication: atrial fibrillation   No Known Allergies  Patient Measurements: Height: 6' (182.9 cm) Weight: 68.9 kg (151 lb 14.4 oz) IBW/kg (Calculated) : 77.6  Vital Signs: Temp: 97 F (36.1 C) (02/10 0800) Temp Source: Axillary (02/10 0800) BP: 126/58 (02/10 0105) Pulse Rate: 81 (02/10 0900)  Labs: Recent Labs    06/11/23 0430 06/11/23 1729 06/11/23 2257 06/12/23 0350 06/12/23 0803 06/12/23 1600 06/13/23 0403  HGB 8.9*  --   --  9.4*  --   --  11.0*  HCT 26.4*  --   --  28.6*  --   --  33.7*  PLT 103*  --   --  77*  --   --  82*  APTT 35 68*   < > 74* 79*  --  62*  HEPARINUNFRC  --  >1.10*  --  >1.10*  --   --  0.65  CREATININE 3.35* 2.19*  --  1.90*  --  1.65* 1.58*   < > = values in this interval not displayed.    Estimated Creatinine Clearance: 42.4 mL/min (A) (by C-G formula based on SCr of 1.58 mg/dL (H)).   Assessment: 73 yo M with PMH afib. Pt is on warfarin PTA. Previously Eliquis  copay was not affordable. However, upon copay check this admission, Eliquis  $0/mo. Therefore pharmacy consulted to switch anticoagulation from warfarin to Eliquis .   2/6- noted with increased work of breathing on bipap. Changing apixaban  (last dose 2/5 at 8pm) to IV heparin     2/7- UFH IV infusion held for anemia requiring transfusion. 2/8 - Hgb at 8.1. OK to resume UFH at conservative dose with no bolus.   aPTT 62 sec (slightly subtherapeutic) on heparin  infusion at 950 units/hr. CBC stable. No signs of bleeding today.  Goal of Therapy:  Heparin  level 0.3-0.7 units/ml aPTT 66-102 seconds Monitor platelets by anticoagulation protocol: Yes  Plan:  Continue heparin  gtt at 950 units/hour  Daily aptt, cbc  Monitor s/s bleeding    Hortensia Ma Pharm.D. CPP, BCPS Clinical Pharmacist [phone] 06/13/2023 9:34 AM

## 2023-06-13 NOTE — IPAL (Signed)
  Interdisciplinary Goals of Care Family Meeting   Date carried out: 06/13/2023  Location of the meeting: Bedside  Member's involved: Physician, Bedside Registered Nurse, and Family Member or next of kin  Durable Power of Attorney or acting medical decision maker: patient and wife    Discussion: We discussed goals of care for [name] [name].  I met with [name] [name] and his wife with his SIL at bedside. We reviewed his case- plans for trial off CRRT later today and monitor for return of renal function. He indicated he does not want iHD if his renal function does not return. We discussed that if his renal function fails to improve he likely will have worsening respiratory status over the coming days. If this happens he would not want to escalate to being intubated. He is fine during this trial to see if renal function improves and continue HHFNC and BiPAP alternating.  If he deteriorates, he would want to transition to complete comfort care. His wife confirms that this is consistent with their discussions this admission and prior to this admission. He confirmed that his wife can make decisions for him and he would not want his children to make decisions for him.  He understands that his children may disagree with his and his wife's decision and he confirmed that he wants his wife to make decisions, not his children. He is fine with them knowing what is going on and what he has voiced that he does not want escalations in his care. RN present during entire discussion.   Code status: Full DNR  Disposition: Continue current acute care; if failing transition to comfort   Time spent for the meeting: 20 min.  [name] [name] 06/13/2023, 12:09 PM

## 2023-06-13 NOTE — Progress Notes (Signed)
 Palliative:  HPI: 73 y.o. male  with past medical history of HTN, HLD, AAA s/p repair, MI, CAD, Combined systolic/diastolic HF, A-Fib on coumadin , Stroke, PVD, COPD, Tobacco use, CKD stage IV, and GERD admitted on 06/01/2023 to [facility] ED with acute onset worsening dyspnea, associated dry cough, and wheezing due to acute hypoxic respiratory failure requiring HHFNC/BiPAP due to HFrEF, COPD, PNA as well as AKI on CKD stage IV on CRRT.   I met today with [name]. Noted recent conversation with Dr. Fulton Job with goals clarified. No family at bedside at time of my visit. [name] further clarifies his wishes for no escalation of care and no further dialysis. He very clearly tells me that he enjoys being active and outside. He wants quality of life over quantity of life. He is not afraid to die and is ready to go with peace if unable to have the quality he desires. We were able to explore his concern that his children do not necessarily see his point of view but his wife and siblings do support these decisions. I encouraged that my team can certainly help support himself and his wife and meet and discuss with his children to help explain if desired. I explained that I will be here through Thursday and I will follow up with Mr. Brzoska and his wife tomorrow.   All questions/concerns addressed. Emotional support provided.   Exam: Alert, oriented. Poor reserve. No distress. HHFNC. CRRT going. Breathing regular, unlabored with tachypnea. Abd soft. Generalized weakness and fatigue.   Plan: - DNR/DNI confirmed - CRRT to be stopped - no plans for further dialysis - Continue other interventions for now - Open to comfort care if further decline  25 min  Vila Grayer, NP Palliative Medicine Team Pager [phone] (Please see [email or web address] for schedule) Team Phone [phone]

## 2023-06-13 NOTE — Progress Notes (Signed)
 Physical Therapy RE-EVALUATION Patient Details Name: [name] [name] MRN: [id number] DOB: 1951-04-28 Today's Date: 06/13/2023   History of Present Illness 73 yo male admitted 1/29 with SOB, work up revealed afib with RVR, sepsis due to PNA, and COPD exacerbation. Intubated 1/30 due to respiratory failure, extubated 1/31. PMH includes: HTN, HLD, AAA s/p repair, MI, CAD, combined systolic and diastolic HF, Afib on coumadin , PVD, COPD, CKD IV, and smoker.    PT Comments  Pt now in ICU on CRRT and heated high flow [place] 40L,60% and was able to complete 5 sit to stands and step pvt transfer to chair with minA physically and 2nd person for line management. Pt very SOB and poor activity tolerance. Pt unable to stand > 15 sec at a time prior to needing to sit. Pt mobilizing well considering medical condition and anticipate once patients improves medically pt will progress well functionally to be able to return home with spouse and home health services. Acute PT to cont to follow.   If plan is discharge home, recommend the following: A little help with walking and/or transfers;A little help with bathing/dressing/bathroom;Assistance with cooking/housework;Help with stairs or ramp for entrance   Can travel by private vehicle        Equipment Recommendations  None recommended by PT (pt has rollator)    Recommendations for Other Services       Precautions / Restrictions Precautions Precautions: Fall Precaution Comments: watch 02 Restrictions Weight Bearing Restrictions Per Provider Order: No     Mobility  Bed Mobility Overal bed mobility: Needs Assistance Bed Mobility: Supine to Sit     Supine to sit: Min assist     General bed mobility comments: pt brought LEs off EOB, minA to elevate trunk,able to scoot self to EOB    Transfers Overall transfer level: Needs assistance Equipment used: 1 person hand held assist, Rollator (4 wheels) Transfers: Sit to/from Stand, Bed to  chair/wheelchair/BSC Sit to Stand: Min assist, Mod assist, +2 safety/equipment   Step pivot transfers: Min assist, +2 safety/equipment       General transfer comment: pt minA to power up and steady during steps to chair while RN managed CRRT lines, heated high flow tube,and tele wires, pt with onset of anxiety requesting to sit immeadiately due to SOB, fatigue and weakness per his report. completed 5 STS while sitting in chair, pt progressed to contact guard with sit to stand, RR increased to 37 at the highest. pt tolerated approx 15 sec each stand, SpO2 > 92% t/o session    Ambulation/Gait               General Gait Details: unable this date due to fatigue, resp status, and CRRT   Stairs             Wheelchair Mobility     Tilt Bed    Modified Rankin (Stroke Patients Only)       Balance Overall balance assessment: Needs assistance Sitting-balance support: Bilateral upper extremity supported, Feet supported Sitting balance-Leahy Scale: Good   Postural control: Posterior lean Standing balance support: Single extremity supported, No upper extremity supported, During functional activity Standing balance-Leahy Scale: Poor Standing balance comment: +SOB                            Cognition Arousal: Alert Behavior During Therapy: WFL for tasks assessed/performed Overall Cognitive Status: Within Functional Limits for tasks assessed  General Comments: pt A&Ox4, able to follow commands, able to voice needs        Exercises General Exercises - Lower Extremity Ankle Circles/Pumps: AROM, Both, 10 reps, Supine Quad Sets: PROM, Both, 5 reps, Seated (with LEs to elevated) Heel Slides: AROM, Both, 10 reps, Supine Straight Leg Raises: AROM, Strengthening, Both, Supine, 5 reps    General Comments General comments (skin integrity, edema, etc.): SpO2 >92% on heated high flow [place] 40L, 60% t/o session.       Pertinent Vitals/Pain Pain Assessment Pain Assessment: Faces Faces Pain Scale: No hurt    Home Living                          Prior Function            PT Goals (current goals can now be found in the care plan section) Acute Rehab PT Goals PT Goal Formulation: With patient/family Time For Goal Achievement: 06/16/23 Potential to Achieve Goals: Good Progress towards PT goals: Progressing toward goals    Frequency    Min 1X/week      PT Plan      Co-evaluation              AM-PAC PT "6 Clicks" Mobility   Outcome Measure  Help needed turning from your back to your side while in a flat bed without using bedrails?: A Little Help needed moving from lying on your back to sitting on the side of a flat bed without using bedrails?: A Little Help needed moving to and from a bed to a chair (including a wheelchair)?: A Little Help needed standing up from a chair using your arms (e.g., wheelchair or bedside chair)?: A Little Help needed to walk in hospital room?: A Lot Help needed climbing 3-5 steps with a railing? : Total 6 Click Score: 15    End of Session Equipment Utilized During Treatment: Oxygen  (heated high flow, 40L, 60%) Activity Tolerance: Patient limited by fatigue Patient left: with call bell/phone within reach;in chair;with family/visitor present Nurse Communication: Mobility status (RN present to assist with transfer) PT Visit Diagnosis: Other abnormalities of gait and mobility (R26.89);History of falling (Z91.81);Difficulty in walking, not elsewhere classified (R26.2)     Time: 1610-9604 PT Time Calculation (min) (ACUTE ONLY): 32 min  Charges:    $Therapeutic Activity: 8-22 mins PT General Charges $$ ACUTE PT VISIT: 1 Visit                     Renaee Caro, PT, DPT Acute Rehabilitation Services Secure chat preferred Office #: [phone]    [name] Moan 06/13/2023, 11:05 AM

## 2023-06-13 NOTE — Progress Notes (Signed)
 Chaplain responded to a consult to finalize AD for Pt. Chaplain has been in contact with Pt throughout the day and was requested to notarize the form when the Volunteers' Office closed for the day. Chaplain and Pt's family scheduled notarization for tomorrow, Tuesday, at 10:00 am. Pt and family were grateful for Chaplain's assistance in filling and completing this form.

## 2023-06-13 NOTE — Consult Note (Signed)
 WOC Nurse wound follow up Wound type: deep tissue pressure injury Measurement: see nursing flow sheet Wound bed: dark purple, non blanchable tissue Drainage (amount, consistency, odor) none Periwound: intact, mildly macerated Dressing procedure/placement/frequency: Continue current POC    WOC Nurse team will follow with you and see patient within 10 days for wound assessments.  Please notify WOC nurses of any acute changes in the wounds or any new areas of concern Krishika Bugge [facility] MSN, RN,CWOCN, CNS, CWON-AP [phone]

## 2023-06-14 ENCOUNTER — Encounter: Payer: Self-pay | Admitting: Oncology

## 2023-06-14 DIAGNOSIS — Z515 Encounter for palliative care: Secondary | ICD-10-CM | POA: Diagnosis not present

## 2023-06-14 DIAGNOSIS — J441 Chronic obstructive pulmonary disease with (acute) exacerbation: Secondary | ICD-10-CM | POA: Diagnosis not present

## 2023-06-14 DIAGNOSIS — N186 End stage renal disease: Secondary | ICD-10-CM | POA: Diagnosis not present

## 2023-06-14 DIAGNOSIS — N179 Acute kidney failure, unspecified: Secondary | ICD-10-CM | POA: Diagnosis not present

## 2023-06-14 DIAGNOSIS — J9601 Acute respiratory failure with hypoxia: Secondary | ICD-10-CM | POA: Diagnosis not present

## 2023-06-14 DIAGNOSIS — Z7189 Other specified counseling: Secondary | ICD-10-CM | POA: Diagnosis not present

## 2023-06-14 DIAGNOSIS — I5023 Acute on chronic systolic (congestive) heart failure: Secondary | ICD-10-CM | POA: Diagnosis not present

## 2023-06-14 LAB — RENAL FUNCTION PANEL
Albumin: 2.8 g/dL — ABNORMAL LOW (ref 3.5–5.0)
Anion gap: 18 — ABNORMAL HIGH (ref 5–15)
BUN: 83 mg/dL — ABNORMAL HIGH (ref 8–23)
CO2: 18 mmol/L — ABNORMAL LOW (ref 22–32)
Calcium: 9.5 mg/dL (ref 8.9–10.3)
Chloride: 102 mmol/L (ref 98–111)
Creatinine, Ser: 3.03 mg/dL — ABNORMAL HIGH (ref 0.61–1.24)
GFR, Estimated: 21 mL/min — ABNORMAL LOW (ref >60)
Glucose, Bld: 167 mg/dL — ABNORMAL HIGH (ref 70–99)
Phosphorus: 2.4 mg/dL — ABNORMAL LOW (ref 2.5–4.6)
Potassium: 4.7 mmol/L (ref 3.5–5.1)
Sodium: 138 mmol/L (ref 135–145)

## 2023-06-14 LAB — CBC
HCT: 35 % — ABNORMAL LOW (ref 39.0–52.0)
Hemoglobin: 11.5 g/dL — ABNORMAL LOW (ref 13.0–17.0)
MCH: 30.2 pg (ref 26.0–34.0)
MCHC: 32.9 g/dL (ref 30.0–36.0)
MCV: 91.9 fL (ref 80.0–100.0)
Platelets: 87 10*3/uL — ABNORMAL LOW (ref 150–400)
RBC: 3.81 MIL/uL — ABNORMAL LOW (ref 4.22–5.81)
RDW: 14.6 % (ref 11.5–15.5)
WBC: 18.6 10*3/uL — ABNORMAL HIGH (ref 4.0–10.5)
nRBC: 0.2 % (ref 0.0–0.2)

## 2023-06-14 LAB — GLUCOSE, CAPILLARY
Glucose-Capillary: 125 mg/dL — ABNORMAL HIGH (ref 70–99)
Glucose-Capillary: 144 mg/dL — ABNORMAL HIGH (ref 70–99)
Glucose-Capillary: 154 mg/dL — ABNORMAL HIGH (ref 70–99)

## 2023-06-14 LAB — HEPARIN LEVEL (UNFRACTIONATED): Heparin Unfractionated: 0.49 [IU]/mL (ref 0.30–0.70)

## 2023-06-14 LAB — MAGNESIUM: Magnesium: 2.8 mg/dL — ABNORMAL HIGH (ref 1.7–2.4)

## 2023-06-14 LAB — APTT: aPTT: 68 s — ABNORMAL HIGH (ref 24–36)

## 2023-06-14 MED ORDER — POLYVINYL ALCOHOL 1.4 % OP SOLN: 1.0000 [drp] | Freq: Four times a day (QID) | OPHTHALMIC | Status: DC | PRN

## 2023-06-14 MED ORDER — MORPHINE BOLUS VIA INFUSION
1.0000 mg | INTRAVENOUS | Status: DC | PRN
  Administered 2023-06-14: 1 mg via INTRAVENOUS
  Administered 2023-06-14: 2 mg via INTRAVENOUS
  Administered 2023-06-14: 1 mg via INTRAVENOUS
  Administered 2023-06-14 – 2023-06-15 (×16): 2 mg via INTRAVENOUS

## 2023-06-14 MED ORDER — GLYCOPYRROLATE 1 MG PO TABS: 1.0000 mg | ORAL_TABLET | ORAL | Status: DC | PRN

## 2023-06-14 MED ORDER — DIPHENHYDRAMINE HCL 50 MG/ML IJ SOLN: 25.0000 mg | INTRAMUSCULAR | Status: DC | PRN

## 2023-06-14 MED ORDER — FENTANYL CITRATE PF 50 MCG/ML IJ SOSY
25.0000 ug | PREFILLED_SYRINGE | INTRAMUSCULAR | Status: DC | PRN
  Administered 2023-06-14 (×2): 50 ug via INTRAVENOUS
  Administered 2023-06-15 (×2): 100 ug via INTRAVENOUS
  Administered 2023-06-15: 50 ug via INTRAVENOUS
  Filled 2023-06-14: qty 1
  Filled 2023-06-14: qty 2
  Filled 2023-06-14: qty 1
  Filled 2023-06-14 (×3): qty 2

## 2023-06-14 MED ORDER — GLYCOPYRROLATE 0.2 MG/ML IJ SOLN: 0.2000 mg | INTRAMUSCULAR | Status: DC | PRN

## 2023-06-14 MED ORDER — GLYCOPYRROLATE 0.2 MG/ML IJ SOLN
0.2000 mg | INTRAMUSCULAR | Status: DC | PRN
  Administered 2023-06-15 (×2): 0.2 mg via INTRAVENOUS
  Filled 2023-06-14 (×2): qty 1

## 2023-06-14 MED ORDER — MIDAZOLAM HCL 2 MG/2ML IJ SOLN
2.0000 mg | INTRAMUSCULAR | Status: DC | PRN
  Administered 2023-06-14: 2 mg via INTRAVENOUS
  Administered 2023-06-14 – 2023-06-15 (×2): 4 mg via INTRAVENOUS
  Filled 2023-06-14 (×3): qty 4
  Filled 2023-06-14: qty 2

## 2023-06-14 MED ORDER — ACETAMINOPHEN 650 MG RE SUPP: 650.0000 mg | Freq: Four times a day (QID) | RECTAL | Status: DC | PRN

## 2023-06-14 MED ORDER — CLONAZEPAM 1 MG PO TABS
2.0000 mg | ORAL_TABLET | Freq: Two times a day (BID) | ORAL | Status: DC
  Administered 2023-06-14: 2 mg via ORAL
  Filled 2023-06-14: qty 2

## 2023-06-14 MED ORDER — MORPHINE 100MG IN NS 100ML (1MG/ML) PREMIX INFUSION
1.0000 mg/h | INTRAVENOUS | Status: DC
  Administered 2023-06-14: 1 mg/h via INTRAVENOUS
  Administered 2023-06-15: 7 mg/h via INTRAVENOUS
  Filled 2023-06-14 (×3): qty 100

## 2023-06-14 MED ORDER — ACETAMINOPHEN 325 MG PO TABS: 650.0000 mg | ORAL_TABLET | Freq: Four times a day (QID) | ORAL | Status: DC | PRN

## 2023-06-14 NOTE — Progress Notes (Signed)
PHARMACY - ANTICOAGULATION Pharmacy Consult for heparin  Indication: atrial fibrillation   No Known Allergies  Patient Measurements: Height: 6' (182.9 cm) Weight: 66.6 kg (146 lb 13.2 oz) IBW/kg (Calculated) : 77.6  Vital Signs: BP: 122/56 (02/11 0552) Pulse Rate: 84 (02/11 0600)  Labs: Recent Labs    06/12/23 0350 06/12/23 0803 06/12/23 1600 06/13/23 0403 06/13/23 1623 06/14/23 0408  HGB 9.4*  --   --  11.0*  --  11.5*  HCT 28.6*  --   --  33.7*  --  35.0*  PLT 77*  --   --  82*  --  87*  APTT 74* 79*  --  62*  --  68*  HEPARINUNFRC >1.10*  --   --  0.65  --  0.49  CREATININE 1.90*  --    < > 1.58* 1.94* 3.03*   < > = values in this interval not displayed.    Estimated Creatinine Clearance: 21.4 mL/min (A) (by C-G formula based on SCr of 3.03 mg/dL (H)).   Assessment: 73 yo M with PMH afib. Pt is on warfarin PTA. Previously Eliquis copay was not affordable. However, upon copay check this admission, Eliquis $0/mo. Therefore pharmacy consulted to switch anticoagulation from warfarin to Eliquis.   2/6- noted with increased work of breathing on bipap. Changing apixaban (last dose 2/5 at 8pm) to IV heparin    2/7- UFH IV infusion held for anemia requiring transfusion. 2/8 - Hgb at 8.1. OK to resume UFH at conservative dose with no bolus.   aPTT 68 sec on lower end of therapeutic range on heparin infusion at 950 units/hr.  Heparin level 0.49, almost correlating with aPTT. CBC stable. No signs of bleeding today.  Goal of Therapy:  Heparin level 0.3-0.7 units/ml aPTT 66-102 seconds Monitor platelets by anticoagulation protocol: Yes  Plan:  Continue heparin gtt at 950 units/hour  Daily aptt, cbc  Monitor s/s bleeding   Trixie Rude, PharmD Clinical Pharmacist 06/14/2023  8:10 AM

## 2023-06-14 NOTE — Progress Notes (Signed)
Nutrition Brief Note  Chart reviewed. Pt now transitioning to comfort care. Cortrak removed, TF discontinued and diet has been ordered for pleasure by MD No further nutrition interventions planned at this time.  Please re-consult as needed.   Romelle Starcher MS, RDN, LDN, CNSC Registered Dietitian 3 Clinical Nutrition RD Inpatient Contact Info in Amion

## 2023-06-14 NOTE — Progress Notes (Signed)
NAME:  [name] [name], MRN:  [id number], DOB:  [birth year], LOS: 13 ADMISSION DATE:  06/01/2023, CONSULTATION DATE:  06/02/2023 REFERRING MD:  Dr. Arville Care CHIEF COMPLAINT:  Shortness of Breath   History of Present Illness:  [name] [name] is a 73 y.o. male with a PMH significant for HTN, HLD, AAA s/p repair, MI, CAD, Combined systolic/diastolic HF, A-Fib on coumadin, Stroke, PVD, COPD, Tobacco use, CKD stage IV, GERD, who presented to [facility] ER 1/29 with acute onset worsening dyspnea, associated dry cough, and wheezing.  Per chart review, he denied fever, chills, nausea, vomiting. Upon arrival he was in tachycardic, tachypneic, BP 174/64, and placed on Bipap. Labs revealed ABG PH 7.41, HCO3 17.1, Lacitc 4.8->4.6, INR 3.5, RVP negative, BUN 47, creat 4.4, Hgb 9.2. EKG with Afib RVR rate 119. A chest xray was obtained showing mild cardiomegaly, mild edema, and possible atypical pneumonia. Patient was started on Ceftriaxone and Zithromax, given 125 mg Solu-Medrol and 2.5 liter IVF bolus. He was planned to be admitted to the step down unit, however, with worsening hypoxia patient was intubated 1/30 and transferred to [facility] ICU for further management.   Pertinent  Medical History  HTN/HLD Afib on Coumadin MI/CAD COPD Tobacco Use CKD stage IV  Significant Hospital Events: Including procedures, antibiotic start and stop dates in addition to other pertinent events   1/29: Acute respiratory on Bipap, requiring intubation. COPD exacerbation. Suspected PNA. Ceftriaxone/Zithromax/Steroids/Nebs 1/31 Weaned, extubated  2/6 Overnight w/ increase wob placed on bipap and given albumin/lasix. UOP last 24 hours 1.1L.  scr rising. difficulty speaking w/ wob still sig. HF team consulted. LVEF 35-40%. Recommended lasix gtt. CVL placement for CVP and SCVO2 monitoring. Deemed not a candidate for mechanical support/advanced therapies. Anxiety making NIPPV difficult.  2/7 encephalopathic. Renal fxn worse. Acid base worse.  Hgb down. Got unit of blood. Did not tol NIPPV w/out being sedated. Placed on heated high flow. Decision made to attempt CRRT.  2/8 off and on bipap/ HHFNC  Interim History / Subjective:  Flexi placed overnight, removed today. He wants to eat and drink and just be comfortable.   Objective   Blood pressure (!) 122/56, pulse 81, temperature 98.3 F (36.8 C), temperature source Oral, resp. rate (!) 23, height 6' (1.829 m), weight 66.6 kg, SpO2 96%.    FiO2 (%):  [40 %-50 %] 40 %   Intake/Output Summary (Last 24 hours) at 06/14/2023 0949 Last data filed at 06/14/2023 0800 Gross per 24 hour  Intake 1708.66 ml  Output 873 ml  Net 835.66 ml   Filed Weights   06/12/23 0400 06/13/23 0705 06/14/23 0418  Weight: 72.2 kg 68.9 kg 66.6 kg   General chronically ill appearing man lying in bed HENT   [place]/AT, eyes anicteric, cortrak Pulm: breathing comfortably on HHFNC Card S1S2, RRR Abd soft, NT Ext minimal edema, some bruising GU: foley with minimal yellow urine Neuro: awake, alert, answering questions appropriately  BUN 83, Cr 3.03   WBC 18.68  H/H 11.5/35 Platelets 87 PTT 68   I/O + 800cc,  -8.2 L for the admission UOP 35cc  Resolved Hospital Problem list   Severe sepsis 2/2 PNA  hypernatremia  Assessment & Plan:  Acute hypoxic respiratory failure due to acute on chronic HFrEF and AECOPD and PNA  H/o Tobacco Use AKI on CKD stage IV; likely cardiorenal syndrome Urinary retention  AGMA Acute on chronic HFrEF (echo 08/2019 50-55%, now 35-40%)  Essential HTN HLD Paroxysmal atrial fibrillation with RVR > now controlled  rate Anxiety w/ new onset delirium, acute metabolic encephalopathy due to uremic Anemia of Chronic Kidney Disease Mild thrombocytopenia, stable At risk for protein calorie malnutrition  Loose stools Deconditioning Goals of care  -He wants to just be comfortable; he feels like he will not survive this and understands that his renal function is not very good  right now. He wants to focus on being comfortable and stop aggressive care measures.  See ipal note.  Wife at bedside later and we confirmed everything again.  -con't bronchodilators -pain, anxety, agitation, nausea, secretion management meds ordered -If he can wean off precedex he can transfer out of ICU. Start clonazepam BID.    Best Practice (right click and "Reselect all SmartList Selections" daily)   Diet/type: Regular consistency (see orders) DVT prophylaxis not indicated Pressure ulcer(s): None GI prophylaxis: N/A Lines: Dialysis Catheter Foley:  Yes, and it is still needed Code Status:  DNR Family update: Patient updated at beside 2/10 wife and patient updated at bedside.    Steffanie Dunn, DO 06/14/23 10:32 AM [facility]  For contact information, see Amion. If no response to pager, please call PCCM consult pager. After hours, 7PM- 7AM, please call Elink.

## 2023-06-14 NOTE — Progress Notes (Signed)
Brief note Off dialysis No plans to restart per palliative  Will sign off for now.  Call with questions  Bufford Buttner MD [facility] [phone]

## 2023-06-14 NOTE — IPAL (Signed)
  Interdisciplinary Goals of Care Family Meeting   Date carried out:: 06/14/2023  Location of the meeting: Bedside  Member's involved: Physician, Bedside Registered Nurse, and Family Member or next of kin  Durable Power of Attorney or acting medical decision maker: patient; wife present    Discussion: We discussed goals of care for [name] [name] .  I met with Mr Smithhart and later he with his wife and RN. He wants to be full comfort care. He understands that he is unlikely to survive and just wants to be comfortable.   Code status: Full DNR  Disposition: In-patient comfort care   Time spent for the meeting: 10 min.  Steffanie Dunn 06/14/2023, 10:33 AM

## 2023-06-14 NOTE — Progress Notes (Signed)
eLink Physician-Brief Progress Note Patient Name: [name] [name] DOB: 1950-07-16 MRN: [id number]   Date of Service  06/14/2023  HPI/Events of Note  Has had 5 liquid Bms in the last 24 hours  eICU Interventions  Flexiseal ordered     Intervention Category Minor Interventions: Routine modifications to care plan (e.g. PRN medications for pain, fever)  [name] [name] 06/14/2023, 1:33 AM

## 2023-06-14 NOTE — Progress Notes (Signed)
Palliative:  HPI: 73 y.o. male  with past medical history of HTN, HLD, AAA s/p repair, MI, CAD, Combined systolic/diastolic HF, A-Fib on coumadin, Stroke, PVD, COPD, Tobacco use, CKD stage IV, and GERD admitted on 06/01/2023 to [facility] ED with acute onset worsening dyspnea, associated dry cough, and wheezing due to acute hypoxic respiratory failure requiring HHFNC/BiPAP due to HFrEF, COPD, PNA as well as AKI on CKD stage IV on CRRT.   I met today with [name] and his wife, [name] [name], at bedside. They ask about HCPOA so I assisted with finding notary to complete. I discussed with them further goals of care. [name] is clear about his desire for comfort care. He looks forward to having Cortrak removed. He is calling and telling his family his decisions. They are expecting family visits from children and grandchildren. [name] tells me that he does not plan to be here tomorrow. He is very much at peace with his decisions and recognizes that his health will worsen. We discussed plans after his family is able to visit with him. We discussed plan for more liberal medications through his IV to ensure his comfort and rest and then titrating down to off oxygen as this can become a life supporting measure in itself. Both [name] and wife agree with plan. I will plan to follow up later after time for family to visit. Discussed liberalized visitation plan.   Update: Discussed with RN. Family still visiting. Morphine infusion and bolus orders signed and helpd - RN to release when they are ready to proceed.   Discussed with RN and Dr. Chestine Spore.   Exam: Alert, oriented. Poor reserve. No distress. HHFNC. CRRT going. Breathing regular, unlabored with tachypnea. Abd soft. Generalized weakness and fatigue.   Plan: - DNR/DNI - Full comfort care - Anticipate hospital death   45 min  [name] Channel, NP Palliative Medicine Team Pager [phone] (Please see [email or web address] for schedule) Team Phone [phone]

## 2023-06-14 NOTE — Progress Notes (Signed)
   06/14/23 1445  Spiritual Encounters  Type of Visit Initial  Care provided to: Pt and family  Conversation partners present during encounter Nurse  OnCall Visit No   Asked to provide support in the form of prayer to the patient and family.  Another chaplain responded to the call.  Will continue to keep the patient and family in prayer   Trish Dyke Brackett [facility]  [phone]

## 2023-06-15 DIAGNOSIS — R739 Hyperglycemia, unspecified: Secondary | ICD-10-CM

## 2023-06-15 DIAGNOSIS — Z515 Encounter for palliative care: Secondary | ICD-10-CM | POA: Diagnosis not present

## 2023-07-02 NOTE — Progress Notes (Signed)
NAME:  [name] [name], MRN:  [id number], DOB:  01-Nov-1950, LOS: 14 ADMISSION DATE:  06/01/2023, CONSULTATION DATE:  06/02/2023 REFERRING MD:  Dr. Arville Care CHIEF COMPLAINT:  Shortness of Breath   History of Present Illness:  [name] [name] is a 73 y.o. male with a PMH significant for HTN, HLD, AAA s/p repair, MI, CAD, Combined systolic/diastolic HF, A-Fib on coumadin, Stroke, PVD, COPD, Tobacco use, CKD stage IV, GERD, who presented to [facility] ER 1/29 with acute onset worsening dyspnea, associated dry cough, and wheezing.  Per chart review, he denied fever, chills, nausea, vomiting. Upon arrival he was in tachycardic, tachypneic, BP 174/64, and placed on Bipap. Labs revealed ABG PH 7.41, HCO3 17.1, Lacitc 4.8->4.6, INR 3.5, RVP negative, BUN 47, creat 4.4, Hgb 9.2. EKG with Afib RVR rate 119. A chest xray was obtained showing mild cardiomegaly, mild edema, and possible atypical pneumonia. Patient was started on Ceftriaxone and Zithromax, given 125 mg Solu-Medrol and 2.5 liter IVF bolus. He was planned to be admitted to the step down unit, however, with worsening hypoxia patient was intubated 1/30 and transferred to [facility] ICU for further management.   Pertinent  Medical History  HTN/HLD Afib on Coumadin MI/CAD COPD Tobacco Use CKD stage IV  Significant Hospital Events: Including procedures, antibiotic start and stop dates in addition to other pertinent events   1/29: Acute respiratory on Bipap, requiring intubation. COPD exacerbation. Suspected PNA. Ceftriaxone/Zithromax/Steroids/Nebs 1/31 Weaned, extubated  2/6 Overnight w/ increase wob placed on bipap and given albumin/lasix. UOP last 24 hours 1.1L.  scr rising. difficulty speaking w/ wob still sig. HF team consulted. LVEF 35-40%. Recommended lasix gtt. CVL placement for CVP and SCVO2 monitoring. Deemed not a candidate for mechanical support/advanced therapies. Anxiety making NIPPV difficult.  2/7 encephalopathic. Renal fxn worse. Acid base worse.  Hgb down. Got unit of blood. Did not tol NIPPV w/out being sedated. Placed on heated high flow. Decision made to attempt CRRT.  2/8 off and on bipap/ HHFNC  Interim History / Subjective:  Remains on morphine gtt per palliative care.  Wife at bedside; she is pleased with how comfortable he is.   Objective   Blood pressure (!) 137/55, pulse 60, temperature 98.4 F (36.9 C), temperature source Axillary, resp. rate 18, height 6' (1.829 m), weight 66.6 kg, SpO2 (!) 76%.        Intake/Output Summary (Last 24 hours) at 06/04/2023 1526 Last data filed at 06/05/2023 1500 Gross per 24 hour  Intake 526.41 ml  Output 150 ml  Net 376.41 ml   Filed Weights   06/13/23 0705 06/14/23 0418 06/18/2023 0706  Weight: 68.9 kg 66.6 kg 66.6 kg   General chronically ill appearing man lying in bed sleeping HENT   [place]/AT Pulm: bradypneic with cheyne stokes Card poor skin perfusion Abd soft, NT Ext bruising, edema Neuro: sleeping comfortably  Resolved Hospital Problem list   Severe sepsis 2/2 PNA  hypernatremia  Assessment & Plan:  Acute hypoxic respiratory failure due to acute on chronic HFrEF and AECOPD and PNA  H/o Tobacco Use AKI on CKD stage IV; likely cardiorenal syndrome Urinary retention  AGMA Acute on chronic HFrEF (echo 08/2019 50-55%, now 35-40%)  Essential HTN HLD Paroxysmal atrial fibrillation with RVR > now controlled rate Anxiety w/ new onset delirium, acute metabolic encephalopathy due to uremic Anemia of Chronic Kidney Disease Mild thrombocytopenia, stable At risk for protein calorie malnutrition  Loose stools Deconditioning Hyperglycemia Palliative care/ comfort care patient -Con't measures focused on patient comfort &  dignity. Liberalized visitation. Wife at bedside today. -Anticipate in hospital death in the next day.  Best Practice (right click and "Reselect all SmartList Selections" daily)   Diet/type: Regular consistency (see orders) DVT prophylaxis not  indicated Pressure ulcer(s): None GI prophylaxis: N/A Lines: Dialysis Catheter Foley:  Yes, and it is still needed Code Status:  DNR Family update: Patient updated at beside 2/12 wife  updated at bedside.    Steffanie Dunn, DO 06/20/2023 3:26 PM [facility]  For contact information, see Amion. If no response to pager, please call PCCM consult pager. After hours, 7PM- 7AM, please call Elink.

## 2023-07-02 NOTE — Death Summary Note (Signed)
DEATH SUMMARY   Patient Details  Name: [name] [name] MRN: [id number] DOB: May 06, 1950  Admission/Discharge Information   Admit Date:  06-20-23  Date of Death: Date of Death: July 04, 2023  Time of Death: Time of Death: 07-08-52  Length of Stay: 2023/07/06  Referring Physician: Anabel Halon, MD   Reason(s) for Hospitalization  Acute respiratory failure with hypoxia, acute COPD exacerbation, multilobar community acquired pneumonia, unknown organism  Diagnoses  Preliminary cause of death:  Secondary Diagnoses (including complications and co-morbidities):  Principal Problem:   COPD with acute exacerbation (HCC) Active Problems:   Essential hypertension   Acute respiratory failure with hypoxia (HCC)   Sepsis due to pneumonia (HCC)   Dyslipidemia   Paroxysmal atrial fibrillation with RVR (HCC)   Respiratory failure (HCC)   Malnutrition of moderate degree   Acute exacerbation of chronic obstructive pulmonary disease (COPD) (HCC)  Acute hypoxic respiratory failure due to acute on chronic HFrEF and AECOPD and PNA  H/o Tobacco Use AKI on CKD stage IV; likely cardiorenal syndrome hypervolemia Urinary retention  AGMA Acute on chronic HFrEF (echo 08/2019 50-55%, now 35-40%)  Essential HTN HLD Paroxysmal atrial fibrillation with RVR > now controlled rate Anxiety w/ new onset delirium, acute metabolic encephalopathy due to uremic Anemia of Chronic Kidney Disease Mild thrombocytopenia, stable At risk for protein calorie malnutrition  Loose stools Deconditioning End of life care Hypernatremia Severe sepsis due to pneumonia  Brief Hospital Course (including significant findings, care, treatment, and services provided and events leading to death)  [name] [name] is a 73 y.o. year old male with a PMH significant for HTN, HLD, AAA s/p repair, MI, CAD, Combined systolic/diastolic HF, A-Fib on coumadin, Stroke, PVD, COPD, Tobacco use, CKD stage IV, GERD, who presented to [facility] ER 06/20/23 with acute  onset worsening dyspnea, associated dry cough, and wheezing. Per chart review, he denied fever, chills, nausea, vomiting. Upon arrival he was in tachycardic, tachypneic, BP 174/64, and placed on Bipap. Labs revealed ABG PH 7.41, HCO3 17.1, Lacitc 4.8->4.6, INR 3.5, RVP negative, BUN 47, creat 4.4, Hgb 9.2. EKG with Afib RVR rate 119. A chest xray was obtained showing mild cardiomegaly, mild edema, and possible atypical pneumonia. Patient was started on Ceftriaxone and Zithromax, given 125 mg Solu-Medrol and 2.5 liter IVF bolus. He was planned to be admitted to the step down unit, however, with worsening hypoxia, he was intubated 1/30 and transferred to [facility] ICU for further management.   Upon arrival to [facility] he was managed with MV, treatment for acute COPD exacerbation, and antibiotics for pneumonia. He had an exacerbation of HFrEF with worsening renal failure refractory to diuretics. Despite aggressive management of his heart failure he had progressive renal failure requiring CRRT for volume removal and metabolic clearance. His respiratory status only marginally improved, still requiring HHFNC alternating with BiPAP. He required supplemental nutrition with NGT and TF to support him. He ultimately decided he did not want to dependent on machines long term, including HD, and wanted to stop aggressive care. He chose to be treated focusing on comfort, and he passed away with family at bedside on July 04, 2023.    Pertinent Labs and Studies  Significant Diagnostic Studies DG CHEST PORT 1 VIEW Result Date: 06/11/2023 CLINICAL DATA:  Hypoxia and shortness of breath. EXAM: PORTABLE CHEST 1 VIEW COMPARISON:  Chest x-ray from same day 0556 hours. FINDINGS: Unchanged right internal jugular central venous catheter with tip in the mid SVC. Unchanged feeding tube entering the stomach with the tip below  the field of view. Stable cardiomegaly. Unchanged mild diffuse interstitial thickening and trace left pleural effusion. No  consolidation or pneumothorax. No acute osseous abnormality. IMPRESSION: 1. Unchanged mild interstitial pulmonary edema and trace left pleural effusion. Electronically Signed   By: Obie Dredge M.D.   On: 06/11/2023 10:27   DG Chest Port 1 View Result Date: 06/11/2023 CLINICAL DATA:  Respiratory failure. EXAM: PORTABLE CHEST 1 VIEW COMPARISON:  06/10/2023. FINDINGS: The cardio pericardial silhouette is enlarged. Mild vascular congestion without overt edema. Retrocardiac atelectasis with small left effusion. Right IJ central line tip overlies the proximal SVC level. A feeding tube passes into the stomach although the distal tip position is not included on the film. Telemetry leads overlie the chest. IMPRESSION: 1. Retrocardiac atelectasis with small left effusion. 2. Mild vascular congestion without overt edema. Electronically Signed   By: Kennith Center M.D.   On: 06/11/2023 10:09   DG CHEST PORT 1 VIEW Result Date: 06/10/2023 CLINICAL DATA:  [id number] Encounter for central line placement [id number] EXAM: PORTABLE CHEST 1 VIEW COMPARISON:  06/09/2023 FINDINGS: Right internal jugular Vas-Cath has been placed with the tip in the SVC. No pneumothorax. Feeding tube noted entering the stomach, the distal aspect of the tube not visualized. Cardiomegaly with vascular congestion. Interstitial prominence could reflect interstitial edema. No confluent opacity or effusion. IMPRESSION: Right central line tip in the SVC.  No pneumothorax. Cardiomegaly, vascular congestion, question early interstitial edema. Electronically Signed   By: Charlett Nose M.D.   On: 06/10/2023 17:28   DG Abd Portable 1V Result Date: 06/10/2023 CLINICAL DATA:  Check gastric catheter placement EXAM: PORTABLE ABDOMEN - 1 VIEW COMPARISON:  06/02/2023 FINDINGS: Weighted feeding catheter is noted extending into the stomach and into the proximal small bowel. The tip is not well visualized on this exam. Stable vascular calcifications are noted in the upper  abdomen. IMPRESSION: Feeding catheter extending into the duodenum although the tip is not well visualized. Electronically Signed   By: Alcide Clever M.D.   On: 06/10/2023 11:14   DG CHEST PORT 1 VIEW Result Date: 06/09/2023 CLINICAL DATA:  10026 Shortness of breath 10026 EXAM: PORTABLE CHEST 1 VIEW COMPARISON:  CXR 06/08/23 FINDINGS: Unchanged small left pleural effusion and improved right-sided pleural effusion. No pneumothorax. Cardiomegaly. Prominent bilateral interstitial opacities could represent pulmonary venous congestion or mild pulmonary edema. No focal airspace opacity. No radiographically apparent displaced rib fractures. Visualized upper abdomen unremarkable. IMPRESSION: Cardiomegaly with improved right-sided pleural effusion and unchanged left-sided pleural effusion no focal airspace opacity Electronically Signed   By: Lorenza Cambridge M.D.   On: 06/09/2023 08:33   DG CHEST PORT 1 VIEW Result Date: 06/08/2023 CLINICAL DATA:  Shortness of breath EXAM: PORTABLE CHEST 1 VIEW COMPARISON:  06/03/2023 FINDINGS: Previously seen endotracheal tube and nasogastric tube no longer present. Chronic cardiomegaly. Coronary artery stents. Aortic atherosclerotic calcification. Pulmonary venous hypertension without frank edema. Small bilateral pleural effusions. Findings consistent with low-grade congestive heart failure. IMPRESSION: Low-grade congestive heart failure. Electronically Signed   By: Paulina Fusi M.D.   On: 06/08/2023 16:15   ECHOCARDIOGRAM COMPLETE Result Date: 06/08/2023    ECHOCARDIOGRAM REPORT   Patient Name:   ANDERSEN IORIO Winburn Date of Exam: 06/08/2023 Medical Rec #:  [id number]       Height:       72.0 in Accession #:    [id number]      Weight:       179.2 lb Date of Birth:  12/08/1950  BSA:          2.033 m Patient Age:    72 years        BP:           180/71 mmHg Patient Gender: M               HR:           87 bpm. Exam Location:  Inpatient Procedure: 2D Echo, Cardiac Doppler and Color Doppler   History:        Patient has prior history of Echocardiogram examinations, most                 recent 08/19/2017. CHF, CAD, COPD, Arrythmias:Atrial                 Fibrillation, Signs/Symptoms:Shortness of Breath; Risk                 Factors:Hypertension. ESRD.  Sonographer:    Webb Laws Referring Phys: [id number] KUBER GHIMIRE IMPRESSIONS  1. HYpokinesis worse in the lateral and inferior walls . Left ventricular ejection fraction, by estimation, is 35 to 40%. The left ventricle has moderately decreased function. The left ventricle demonstrates global hypokinesis. There is mild left ventricular hypertrophy. Left ventricular diastolic parameters are indeterminate.  2. Right ventricular systolic function is normal. The right ventricular size is normal.  3. Left atrial size was severely dilated.  4. Right atrial size was severely dilated.  5. Mild to moderate mitral valve regurgitation.  6. Tricuspid valve regurgitation is mild to moderate.  7. The aortic valve is tricuspid. Aortic valve regurgitation is not visualized. Aortic valve sclerosis/calcification is present, without any evidence of aortic stenosis.  8. The inferior vena cava is dilated in size with <50% respiratory variability, suggesting right atrial pressure of 15 mmHg. Comparison(s): The left ventricular function is worsened. FINDINGS  Left Ventricle: HYpokinesis worse in the lateral and inferior walls. Left ventricular ejection fraction, by estimation, is 35 to 40%. The left ventricle has moderately decreased function. The left ventricle demonstrates global hypokinesis. The left ventricular internal cavity size was normal in size. There is mild left ventricular hypertrophy. Left ventricular diastolic parameters are indeterminate. Right Ventricle: The right ventricular size is normal. Right vetricular wall thickness was not assessed. Right ventricular systolic function is normal. Left Atrium: Left atrial size was severely dilated. Right Atrium: Right  atrial size was severely dilated. Pericardium: Trivial pericardial effusion is present. Mitral Valve: Mild mitral annular calcification. Mild to moderate mitral valve regurgitation. Tricuspid Valve: The tricuspid valve is normal in structure. Tricuspid valve regurgitation is mild to moderate. Aortic Valve: The aortic valve is tricuspid. Aortic valve regurgitation is not visualized. Aortic valve sclerosis/calcification is present, without any evidence of aortic stenosis. Pulmonic Valve: The pulmonic valve was grossly normal. Pulmonic valve regurgitation is trivial. Aorta: The aortic root and ascending aorta are structurally normal, with no evidence of dilitation. Venous: The inferior vena cava is dilated in size with less than 50% respiratory variability, suggesting right atrial pressure of 15 mmHg. IAS/Shunts: No atrial level shunt detected by color flow Doppler.  LEFT VENTRICLE PLAX 2D LVIDd:         5.30 cm      Diastology LVIDs:         4.30 cm      LV e' medial:    5.11 cm/s LV PW:         1.30 cm      LV E/e' medial:  33.9 LV  IVS:        0.90 cm      LV e' lateral:   8.38 cm/s LVOT diam:     2.30 cm      LV E/e' lateral: 20.6 LV SV:         66 LV SV Index:   33 LVOT Area:     4.15 cm  LV Volumes (MOD) LV vol d, MOD A2C: 117.0 ml LV vol d, MOD A4C: 75.0 ml LV vol s, MOD A2C: 59.6 ml LV vol s, MOD A4C: 49.2 ml LV SV MOD A2C:     57.4 ml LV SV MOD A4C:     75.0 ml LV SV MOD BP:      38.4 ml RIGHT VENTRICLE             IVC RV Basal diam:  2.80 cm     IVC diam: 2.90 cm RV S prime:     10.30 cm/s TAPSE (M-mode): 1.7 cm LEFT ATRIUM              Index        RIGHT ATRIUM           Index LA diam:        5.90 cm  2.90 cm/m   RA Area:     32.80 cm LA Vol (A2C):   151.0 ml 74.26 ml/m  RA Volume:   118.00 ml 58.03 ml/m LA Vol (A4C):   101.0 ml 49.67 ml/m LA Biplane Vol: 124.0 ml 60.98 ml/m  AORTIC VALVE LVOT Vmax:   95.25 cm/s LVOT Vmean:  60.350 cm/s LVOT VTI:    0.160 m  AORTA Ao Root diam: 3.30 cm Ao Asc diam:   3.70 cm MITRAL VALVE                TRICUSPID VALVE MV Area (PHT): 3.67 cm     TR Peak grad:   30.0 mmHg MR Peak grad: 121.9 mmHg    TR Vmax:        274.00 cm/s MR Mean grad: 82.0 mmHg MR Vmax:      552.00 cm/s   SHUNTS MR Vmean:     433.0 cm/s    Systemic VTI:  0.16 m MV E velocity: 173.00 cm/s  Systemic Diam: 2.30 cm Dietrich Pates MD Electronically signed by Dietrich Pates MD Signature Date/Time: 06/08/2023/3:39:02 PM    Final    US RENAL Result Date: 06/06/2023 CLINICAL DATA:  Acute kidney injury EXAM: RENAL / URINARY TRACT ULTRASOUND COMPLETE COMPARISON:  CT 08/20/2019 FINDINGS: Right Kidney: Renal measurements: 6.9 x 3.2 x 5.5 cm = volume: 64 mL. Hyperechoic. No hydronephrosis or mass. Left Kidney: Renal measurements: 11.9 x 5.8 x 5.3 cm = volume: 188 mL. Slightly echogenic cortex. No hydronephrosis. No mass. Bladder: Appears normal for degree of bladder distention. Other: None. IMPRESSION: Atrophic right kidney. Mild compensatory hypertrophy of the left kidney. No obstruction. Slightly increased echogenicity of the left renal cortical tissue. Electronically Signed   By: Paulina Fusi M.D.   On: 06/06/2023 21:41   DG CHEST PORT 1 VIEW Result Date: 06/03/2023 CLINICAL DATA:  Pneumonia. EXAM: PORTABLE CHEST 1 VIEW COMPARISON:  June 02, 2023. FINDINGS: Stable cardiomegaly. Endotracheal and nasogastric tubes are unchanged. Bibasilar atelectasis and effusions are noted. Bony thorax is unremarkable. IMPRESSION: Stable support apparatus.  Bibasilar opacities as noted above. Electronically Signed   By: Lupita Raider M.D.   On: 06/03/2023 09:10   DG Chest [facility] 1 View Result  Date: 06/02/2023 CLINICAL DATA:  73 year old male status post intubation. EXAM: PORTABLE CHEST 1 VIEW COMPARISON:  Chest x-ray 06/02/2023. FINDINGS: An endotracheal tube is in place with tip 4.4 cm above the carina. Opacity in the left base which may reflect atelectasis and/or consolidation. No pneumothorax. Widespread areas of interstitial  prominence, diffuse peribronchial cuffing and patchy multifocal ill-defined opacities scattered throughout the lungs bilaterally, worsening throughout the right lung, concerning for bronchitis and developing multilobar bilateral bronchopneumonia. No evidence of pulmonary edema. Heart size is mildly enlarged. Upper mediastinal contours are within normal limits. Atherosclerotic calcifications are noted in the thoracic aorta. IMPRESSION: 1. Support apparatus, as above. 2. The appearance of the chest suggests progressive multilobar bilateral bronchopneumonia. 3. Small left pleural effusion. Electronically Signed   By: Trudie Reed M.D.   On: 06/02/2023 05:57   DG Chest Port 1 View Result Date: 06/02/2023 CLINICAL DATA:  Acute respiratory failure EXAM: PORTABLE CHEST 1 VIEW COMPARISON:  Film from earlier in the same day. FINDINGS: Cardiac shadow is mildly prominent but accentuated by the frontal technique. Aortic calcifications are seen. Endotracheal tube and gastric catheter are noted. The gastric catheter could be advanced deeper into the stomach. No focal infiltrate is seen. No bony abnormality is noted. IMPRESSION: Gastric catheter as described. This could be advanced deeper into the stomach. No acute abnormality noted. Electronically Signed   By: Alcide Clever M.D.   On: 06/02/2023 03:03   DG Abdomen 1 View Result Date: 06/02/2023 CLINICAL DATA:  Check gastric catheter placement EXAM: ABDOMEN - 1 VIEW COMPARISON:  None Available. FINDINGS: Gastric catheter lies in the distal esophagus. This should be advanced deeper into the stomach. IMPRESSION: Gastric catheter within the distal esophagus. Electronically Signed   By: Alcide Clever M.D.   On: 06/02/2023 02:36   DG Chest Portable 1 View Result Date: 06/02/2023 CLINICAL DATA:  Status post intubation EXAM: PORTABLE CHEST 1 VIEW COMPARISON:  06/01/2023 FINDINGS: Endotracheal tube and gastric catheter are noted. Gastric catheter shows the proximal side port in  the distal esophagus. This could be advanced deeper into the stomach. The left lung is well not well evaluated on this exam. Central vascular congestion with interstitial edema is seen. IMPRESSION: Endotracheal tube in satisfactory position. Gastric catheter should be advanced deeper into the stomach. The central vascular congestion with interstitial edema. Electronically Signed   By: Alcide Clever M.D.   On: 06/02/2023 02:33   DG Chest Port 1 View Result Date: 06/01/2023 CLINICAL DATA:  COPD. EXAM: PORTABLE CHEST 1 VIEW COMPARISON:  Chest radiograph dated 08/24/2020. FINDINGS: There is mild cardiomegaly. Diffuse interstitial prominence and nodularity predominantly involving the mid to lower lung field likely represent mild edema. Atypical pneumonia is not excluded. Trace bilateral pleural effusions may be present. No pneumothorax. Atherosclerotic calcification of the aorta. No acute osseous pathology. IMPRESSION: Mild cardiomegaly with mild edema. Atypical pneumonia is not excluded. Electronically Signed   By: Elgie Collard M.D.   On: 06/01/2023 18:43   CT CHEST LUNG CA SCREEN LOW DOSE W/O CM Result Date: 05/27/2023 CLINICAL DATA:  Eighty-two pack-year current smoker. EXAM: CT CHEST WITHOUT CONTRAST LOW-DOSE FOR LUNG CANCER SCREENING TECHNIQUE: Multidetector CT imaging of the chest was performed following the standard protocol without IV contrast. RADIATION DOSE REDUCTION: This exam was performed according to the departmental dose-optimization program which includes automated exposure control, adjustment of the mA and/or kV according to patient size and/or use of iterative reconstruction technique. COMPARISON:  05/12/2021. FINDINGS: Cardiovascular: Atherosclerotic calcification of the aorta, aortic  valve and coronary arteries. Heart is enlarged. No pericardial effusion. Mediastinum/Nodes: No pathologically enlarged mediastinal or axillary lymph nodes. Hilar regions are difficult to definitively evaluate  without IV contrast. Esophagus is grossly unremarkable. Lungs/Pleura: Centrilobular and paraseptal emphysema. Smoking related respiratory bronchiolitis. 4.9 mm in nodule along the right major fissure. No suspicious pulmonary nodules. No pleural fluid. Airway is unremarkable. Upper Abdomen: Cholecystectomy. 3.0 cm left adrenal nodule measures 30 Hounsfield units, present dating back to 08/19/2019. No specific follow-up necessary. Atrophic right kidney. Gastric wall thickening. Visualized portions of the liver, adrenal glands, kidneys, spleen, pancreas, stomach and bowel are otherwise grossly unremarkable. No upper abdominal adenopathy. Musculoskeletal: Degenerative changes in the spine.  Osteopenia. IMPRESSION: 1. Lung-RADS 2, benign appearance or behavior. Continue annual screening with low-dose chest CT without contrast in 12 months. 2. Left adrenal adenoma. 3. Gastric wall thickening. 4. Aortic atherosclerosis (ICD10-I70.0). Coronary artery calcification. 5.  Emphysema (ICD10-J43.9). Electronically Signed   By: Leanna Battles M.D.   On: 05/27/2023 15:01    Microbiology No results found for this or any previous visit (from the past 240 hours).  Lab Basic Metabolic Panel: Recent Labs  Lab 06/11/23 0430 06/11/23 1729 06/12/23 0350 06/12/23 1600 06/13/23 0403 06/13/23 1623 06/14/23 0408  NA 141   < > 138 137 139 136 138  K 4.3   < > 4.7 4.7 4.6 4.9 4.7  CL 102   < > 105 102 101 103 102  CO2 22   < > 24 23 23 22  18*  GLUCOSE 130*   < > 103* 128* 131* 166* 167*  BUN 84*   < > 49* 42* 37* 44* 83*  CREATININE 3.35*   < > 1.90* 1.65* 1.58* 1.94* 3.03*  CALCIUM 9.1   < > 8.8* 8.9 9.5 9.2 9.5  MG 2.9*  --  2.7*  --  2.7*  --  2.8*  PHOS 4.7*   < > 3.8 2.8 2.7 2.4* 2.4*   < > = values in this interval not displayed.   Liver Function Tests: Recent Labs  Lab 06/09/23 1000 06/10/23 0320 06/10/23 1415 06/12/23 0350 06/12/23 1600 06/13/23 0403 06/13/23 1623 06/14/23 0408  AST 26 29  --   --    --   --   --   --   ALT 50* 49*  --   --   --   --   --   --   ALKPHOS 51 44  --   --   --   --   --   --   BILITOT 0.6 1.0  --   --   --   --   --   --   PROT 6.4* 5.5*  --   --   --   --   --   --   ALBUMIN 3.3*  3.3* 2.8*   < > 2.8* 2.9* 3.0* 3.0* 2.8*   < > = values in this interval not displayed.   No results for input(s): "LIPASE", "AMYLASE" in the last 168 hours. No results for input(s): "AMMONIA" in the last 168 hours. CBC: Recent Labs  Lab 06/09/23 1000 06/10/23 0320 06/10/23 1427 06/10/23 2020 06/11/23 0430 06/12/23 0350 06/13/23 0403 06/14/23 0408  WBC 9.9 8.4  --   --  11.7* 10.5 12.8* 18.6*  NEUTROABS 8.7*  --   --   --   --   --   --   --   HGB 7.6* 6.9*   < > 8.4* 8.9*  9.4* 11.0* 11.5*  HCT 24.5* 21.9*   < > 25.3* 26.4* 28.6* 33.7* 35.0*  MCV 98.8 100.0  --   --  93.6 92.3 93.1 91.9  PLT 113* 105*  --   --  103* 77* 82* 87*   < > = values in this interval not displayed.   Cardiac Enzymes: No results for input(s): "CKTOTAL", "CKMB", "CKMBINDEX", "TROPONINI" in the last 168 hours. Sepsis Labs: Recent Labs  Lab 06/09/23 1000 06/09/23 1208 06/10/23 0320 06/10/23 1010 06/10/23 1415 06/11/23 0430 06/12/23 0350 06/13/23 0403 06/14/23 0408  PROCALCITON 2.01  --   --  4.65  --   --   --   --   --   WBC 9.9  --    < >  --   --  11.7* 10.5 12.8* 18.6*  LATICACIDVEN  --  0.7  --  0.8 0.9  --   --   --   --    < > = values in this interval not displayed.    Procedures/Operations  Intubation CVC HD line  A-line insertion  Steffanie Dunn 06/09/2023, 7:57 PM

## 2023-07-02 NOTE — TOC Progression Note (Signed)
Transition of Care [facility] - Progression Note    Patient Details  Name: [name] [name] MRN: [id number] Date of Birth: 1951/04/11  Transition of Care [facility]) CM/SW Contact  Graves-Bigelow, Lamar Laundry, RN Phone Number: 06/12/2023, 11:16 AM  Clinical Narrative:  Patient was discussed in morning rounds. Patient is now full comfort care. Family at the bedside.     Barriers to Discharge: No Barriers Identified   Living arrangements for the past 2 months: Mobile Home                   DME Agency: NA  Social Determinants of Health (SDOH) Interventions SDOH Screenings   Food Insecurity: No Food Insecurity (06/04/2023)  Housing: Patient Unable To Answer (06/02/2023)  Transportation Needs: Patient Unable To Answer (06/02/2023)  Utilities: Patient Unable To Answer (06/02/2023)  Alcohol Screen: Low Risk  (02/16/2021)  Depression (PHQ2-9): Low Risk  (04/21/2023)  Financial Resource Strain: Low Risk  (04/20/2023)  Physical Activity: Insufficiently Active (04/20/2023)  Social Connections: Patient Unable To Answer (06/02/2023)  Stress: No Stress Concern Present (04/20/2023)  Tobacco Use: High Risk (06/01/2023)  Health Literacy: Adequate Health Literacy (04/20/2023)    Readmission Risk Interventions    06/06/2023    1:09 PM  Readmission Risk Prevention Plan  Transportation Screening Complete  HRI or Home Care Consult Complete  Social Work Consult for Recovery Care Planning/Counseling Complete  Palliative Care Screening Not Applicable  Medication Review Oceanographer) Complete

## 2023-07-02 NOTE — Progress Notes (Signed)
Palliative:  HPI: 73 y.o. male with past medical history of HTN, HLD, AAA s/p repair, MI, CAD, Combined systolic/diastolic HF, A-Fib on coumadin, Stroke, PVD, COPD, Tobacco use, CKD stage IV, and GERD admitted on 06/01/2023 to [facility] ED with acute onset worsening dyspnea, associated dry cough, and wheezing due to acute hypoxic respiratory failure requiring HHFNC/BiPAP due to HFrEF, COPD, PNA as well as AKI on CKD stage IV on CRRT.    I met today at [name] bedside along with wife, Bonita Quin. [name] appears comfortable. He appears to be progressing quickly. He has apnea and HR and BP are dropping. Discussed with Bonita Quin at the bedside who reports that he has been comfortable since she arrived back to bedside this morning. She let his kids have some time with him overnight. She requested time alone with him this morning. She is aware of his progress at end of life and appears at peace. She is comforting [name] and letting him know it is okay for him to go. She shares that they have had many conversations regarding end of life and wishes and she knows that he is ready.   Discussed with RN and pharmacist. All questions/concerns addressed. Emotional support provided.  Exam: Unresponsive. No distress. Breathing irregular with frequent apnea. Bradycardia and hypotension. Extremities cool to touch.   Plan: - DNR/DNI - Full comfort care - Anticipate hospital death  35 min  Yong Channel, NP Palliative Medicine Team Pager [phone] (Please see [email or web address] for schedule) Team Phone [phone]

## 2023-07-02 DEATH — deceased

## 2023-07-06 ENCOUNTER — Inpatient Hospital Stay: Payer: 59

## 2023-07-13 ENCOUNTER — Inpatient Hospital Stay: Payer: 59 | Admitting: Oncology

## 2023-07-14 ENCOUNTER — Other Ambulatory Visit: Payer: Self-pay | Admitting: Cardiology

## 2023-09-28 ENCOUNTER — Ambulatory Visit: Payer: 59 | Admitting: Cardiology

## 2023-10-20 ENCOUNTER — Ambulatory Visit: Payer: 59 | Admitting: Internal Medicine
